# Patient Record
Sex: Female | Born: 1940 | Race: Black or African American | Hispanic: No | Marital: Married | Smoking: Former smoker
Health system: Southern US, Community
[De-identification: ages and names within clinical notes are randomized; demographics above are authoritative.]

## PROBLEM LIST (undated history)

## (undated) DIAGNOSIS — I779 Disorder of arteries and arterioles, unspecified: Secondary | ICD-10-CM

## (undated) DIAGNOSIS — G459 Transient cerebral ischemic attack, unspecified: Secondary | ICD-10-CM

## (undated) DIAGNOSIS — E785 Hyperlipidemia, unspecified: Secondary | ICD-10-CM

## (undated) DIAGNOSIS — I255 Ischemic cardiomyopathy: Secondary | ICD-10-CM

## (undated) DIAGNOSIS — Z9289 Personal history of other medical treatment: Secondary | ICD-10-CM

## (undated) DIAGNOSIS — E039 Hypothyroidism, unspecified: Secondary | ICD-10-CM

## (undated) DIAGNOSIS — D72829 Elevated white blood cell count, unspecified: Secondary | ICD-10-CM

## (undated) DIAGNOSIS — I4811 Longstanding persistent atrial fibrillation: Secondary | ICD-10-CM

## (undated) DIAGNOSIS — I219 Acute myocardial infarction, unspecified: Secondary | ICD-10-CM

## (undated) DIAGNOSIS — E876 Hypokalemia: Secondary | ICD-10-CM

## (undated) DIAGNOSIS — I2510 Atherosclerotic heart disease of native coronary artery without angina pectoris: Secondary | ICD-10-CM

## (undated) DIAGNOSIS — I82409 Acute embolism and thrombosis of unspecified deep veins of unspecified lower extremity: Secondary | ICD-10-CM

## (undated) DIAGNOSIS — I071 Rheumatic tricuspid insufficiency: Secondary | ICD-10-CM

## (undated) DIAGNOSIS — I739 Peripheral vascular disease, unspecified: Secondary | ICD-10-CM

## (undated) DIAGNOSIS — I639 Cerebral infarction, unspecified: Secondary | ICD-10-CM

## (undated) DIAGNOSIS — N183 Chronic kidney disease, stage 3 unspecified: Secondary | ICD-10-CM

## (undated) DIAGNOSIS — I10 Essential (primary) hypertension: Secondary | ICD-10-CM

## (undated) HISTORY — PX: TOTAL ABDOMINAL HYSTERECTOMY: SHX209

## (undated) HISTORY — DX: Ischemic cardiomyopathy: I25.5

## (undated) HISTORY — DX: Rheumatic tricuspid insufficiency: I07.1

## (undated) HISTORY — PX: APPENDECTOMY: SHX54

## (undated) HISTORY — DX: Hypothyroidism, unspecified: E03.9

## (undated) HISTORY — DX: Peripheral vascular disease, unspecified: I73.9

## (undated) HISTORY — DX: Personal history of other medical treatment: Z92.89

## (undated) HISTORY — DX: Cerebral infarction, unspecified: I63.9

## (undated) HISTORY — PX: COLONOSCOPY: SHX174

## (undated) HISTORY — DX: Elevated white blood cell count, unspecified: D72.829

## (undated) HISTORY — DX: Chronic kidney disease, stage 3 unspecified: N18.30

## (undated) HISTORY — DX: Hyperlipidemia, unspecified: E78.5

## (undated) HISTORY — DX: Essential (primary) hypertension: I10

## (undated) HISTORY — PX: TONSILLECTOMY: SHX5217

## (undated) HISTORY — DX: Longstanding persistent atrial fibrillation: I48.11

## (undated) HISTORY — DX: Acute myocardial infarction, unspecified: I21.9

## (undated) HISTORY — DX: Hypokalemia: E87.6

## (undated) HISTORY — DX: Disorder of arteries and arterioles, unspecified: I77.9

## (undated) HISTORY — DX: Transient cerebral ischemic attack, unspecified: G45.9

## (undated) HISTORY — DX: Acute embolism and thrombosis of unspecified deep veins of unspecified lower extremity: I82.409

## (undated) HISTORY — PX: OTHER SURGICAL HISTORY: SHX169

## (undated) HISTORY — DX: Atherosclerotic heart disease of native coronary artery without angina pectoris: I25.10

---

## 1990-03-11 DIAGNOSIS — I219 Acute myocardial infarction, unspecified: Secondary | ICD-10-CM

## 1990-03-11 HISTORY — DX: Acute myocardial infarction, unspecified: I21.9

## 1990-03-11 HISTORY — PX: CARDIAC CATHETERIZATION: SHX172

## 1999-06-06 ENCOUNTER — Encounter: Admission: RE | Admit: 1999-06-06 | Discharge: 1999-06-06 | Payer: Self-pay | Admitting: Obstetrics & Gynecology

## 1999-06-06 ENCOUNTER — Encounter: Payer: Self-pay | Admitting: Obstetrics & Gynecology

## 2004-03-06 ENCOUNTER — Ambulatory Visit: Payer: Self-pay | Admitting: Internal Medicine

## 2004-03-13 ENCOUNTER — Ambulatory Visit: Payer: Self-pay | Admitting: Internal Medicine

## 2004-09-05 ENCOUNTER — Ambulatory Visit: Payer: Self-pay | Admitting: Cardiology

## 2004-09-21 ENCOUNTER — Ambulatory Visit: Payer: Self-pay | Admitting: Internal Medicine

## 2004-09-25 ENCOUNTER — Ambulatory Visit: Payer: Self-pay | Admitting: Internal Medicine

## 2004-11-30 ENCOUNTER — Ambulatory Visit: Payer: Self-pay | Admitting: Cardiology

## 2005-08-22 ENCOUNTER — Encounter: Admission: RE | Admit: 2005-08-22 | Discharge: 2005-08-22 | Payer: Self-pay | Admitting: Obstetrics & Gynecology

## 2005-09-25 ENCOUNTER — Ambulatory Visit: Payer: Self-pay | Admitting: Internal Medicine

## 2005-09-30 ENCOUNTER — Ambulatory Visit: Payer: Self-pay | Admitting: Cardiology

## 2005-10-04 ENCOUNTER — Ambulatory Visit: Payer: Self-pay

## 2005-10-04 ENCOUNTER — Ambulatory Visit: Payer: Self-pay | Admitting: Cardiology

## 2005-10-04 ENCOUNTER — Encounter: Payer: Self-pay | Admitting: Internal Medicine

## 2005-10-10 ENCOUNTER — Ambulatory Visit: Payer: Self-pay | Admitting: Cardiology

## 2006-07-22 ENCOUNTER — Encounter: Payer: Self-pay | Admitting: Internal Medicine

## 2006-07-22 ENCOUNTER — Ambulatory Visit: Payer: Self-pay | Admitting: Internal Medicine

## 2006-09-17 ENCOUNTER — Encounter: Admission: RE | Admit: 2006-09-17 | Discharge: 2006-09-17 | Payer: Self-pay | Admitting: Internal Medicine

## 2006-10-06 ENCOUNTER — Ambulatory Visit: Payer: Self-pay | Admitting: Cardiology

## 2006-10-29 ENCOUNTER — Ambulatory Visit: Payer: Self-pay | Admitting: Internal Medicine

## 2006-10-29 DIAGNOSIS — I2510 Atherosclerotic heart disease of native coronary artery without angina pectoris: Secondary | ICD-10-CM | POA: Insufficient documentation

## 2006-11-05 LAB — CONVERTED CEMR LAB
ALT: 23 units/L (ref 0–35)
AST: 26 units/L (ref 0–37)
Albumin: 4.2 g/dL (ref 3.5–5.2)
Alkaline Phosphatase: 79 units/L (ref 39–117)
BUN: 15 mg/dL (ref 6–23)
Basophils Absolute: 0.1 10*3/uL (ref 0.0–0.1)
Basophils Relative: 0.6 % (ref 0.0–1.0)
Bilirubin, Direct: 0.1 mg/dL (ref 0.0–0.3)
CO2: 33 meq/L — ABNORMAL HIGH (ref 19–32)
Calcium: 9.6 mg/dL (ref 8.4–10.5)
Chloride: 103 meq/L (ref 96–112)
Cholesterol: 150 mg/dL (ref 0–200)
Creatinine, Ser: 1 mg/dL (ref 0.4–1.2)
Eosinophils Absolute: 0.1 10*3/uL (ref 0.0–0.6)
Eosinophils Relative: 1 % (ref 0.0–5.0)
GFR calc Af Amer: 71 mL/min
GFR calc non Af Amer: 59 mL/min
Glucose, Bld: 105 mg/dL — ABNORMAL HIGH (ref 70–99)
HCT: 39.2 % (ref 36.0–46.0)
HDL: 54.6 mg/dL (ref >39.0)
Hemoglobin: 13.6 g/dL (ref 12.0–15.0)
LDL Cholesterol: 73 mg/dL (ref 0–99)
Lymphocytes Relative: 32.8 % (ref 12.0–46.0)
MCHC: 34.8 g/dL (ref 30.0–36.0)
MCV: 92.4 fL (ref 78.0–100.0)
Monocytes Absolute: 0.9 10*3/uL — ABNORMAL HIGH (ref 0.2–0.7)
Monocytes Relative: 8.3 % (ref 3.0–11.0)
Neutro Abs: 5.8 10*3/uL (ref 1.4–7.7)
Neutrophils Relative %: 57.3 % (ref 43.0–77.0)
Platelets: 300 10*3/uL (ref 150–400)
Potassium: 3.3 meq/L — ABNORMAL LOW (ref 3.5–5.1)
RBC: 4.24 M/uL (ref 3.87–5.11)
RDW: 12.3 % (ref 11.5–14.6)
Sodium: 144 meq/L (ref 135–145)
TSH: 6.34 microintl units/mL — ABNORMAL HIGH (ref 0.35–5.50)
Total Bilirubin: 0.9 mg/dL (ref 0.3–1.2)
Total CHOL/HDL Ratio: 2.7
Total Protein: 7.5 g/dL (ref 6.0–8.3)
Triglycerides: 110 mg/dL (ref 0–149)
VLDL: 22 mg/dL (ref 0–40)
WBC: 10.3 10*3/uL (ref 4.5–10.5)

## 2006-11-06 ENCOUNTER — Encounter (INDEPENDENT_AMBULATORY_CARE_PROVIDER_SITE_OTHER): Payer: Self-pay | Admitting: *Deleted

## 2006-11-07 ENCOUNTER — Ambulatory Visit: Payer: Self-pay | Admitting: Internal Medicine

## 2006-11-11 ENCOUNTER — Encounter (INDEPENDENT_AMBULATORY_CARE_PROVIDER_SITE_OTHER): Payer: Self-pay | Admitting: *Deleted

## 2006-11-14 ENCOUNTER — Telehealth (INDEPENDENT_AMBULATORY_CARE_PROVIDER_SITE_OTHER): Payer: Self-pay | Admitting: *Deleted

## 2006-11-14 ENCOUNTER — Ambulatory Visit: Payer: Self-pay | Admitting: Internal Medicine

## 2006-11-27 ENCOUNTER — Ambulatory Visit: Payer: Self-pay | Admitting: Internal Medicine

## 2006-11-29 LAB — CONVERTED CEMR LAB
Free T4: 0.6 ng/dL (ref 0.6–1.6)
Hgb A1c MFr Bld: 5.2 % (ref 4.6–6.0)
Potassium: 3.8 meq/L (ref 3.5–5.1)
TSH: 8.24 microintl units/mL — ABNORMAL HIGH (ref 0.35–5.50)

## 2006-12-01 ENCOUNTER — Encounter (INDEPENDENT_AMBULATORY_CARE_PROVIDER_SITE_OTHER): Payer: Self-pay | Admitting: *Deleted

## 2006-12-04 ENCOUNTER — Ambulatory Visit: Payer: Self-pay | Admitting: Internal Medicine

## 2006-12-23 ENCOUNTER — Encounter: Payer: Self-pay | Admitting: Internal Medicine

## 2006-12-23 ENCOUNTER — Ambulatory Visit: Payer: Self-pay | Admitting: Internal Medicine

## 2006-12-23 LAB — HM COLONOSCOPY

## 2007-01-21 DIAGNOSIS — I82409 Acute embolism and thrombosis of unspecified deep veins of unspecified lower extremity: Secondary | ICD-10-CM | POA: Insufficient documentation

## 2007-01-23 ENCOUNTER — Ambulatory Visit: Payer: Self-pay | Admitting: Internal Medicine

## 2007-02-19 ENCOUNTER — Ambulatory Visit: Payer: Self-pay | Admitting: Internal Medicine

## 2007-02-22 LAB — CONVERTED CEMR LAB: TSH: 3.13 microintl units/mL (ref 0.35–5.50)

## 2007-02-23 ENCOUNTER — Encounter (INDEPENDENT_AMBULATORY_CARE_PROVIDER_SITE_OTHER): Payer: Self-pay | Admitting: *Deleted

## 2007-02-24 ENCOUNTER — Ambulatory Visit: Payer: Self-pay | Admitting: Internal Medicine

## 2007-02-24 DIAGNOSIS — I10 Essential (primary) hypertension: Secondary | ICD-10-CM | POA: Insufficient documentation

## 2007-02-24 DIAGNOSIS — E785 Hyperlipidemia, unspecified: Secondary | ICD-10-CM | POA: Insufficient documentation

## 2007-02-24 DIAGNOSIS — E039 Hypothyroidism, unspecified: Secondary | ICD-10-CM | POA: Insufficient documentation

## 2007-06-04 DIAGNOSIS — K5730 Diverticulosis of large intestine without perforation or abscess without bleeding: Secondary | ICD-10-CM | POA: Insufficient documentation

## 2007-06-04 DIAGNOSIS — I739 Peripheral vascular disease, unspecified: Secondary | ICD-10-CM | POA: Insufficient documentation

## 2007-08-24 ENCOUNTER — Ambulatory Visit: Payer: Self-pay | Admitting: Internal Medicine

## 2007-09-04 ENCOUNTER — Encounter (INDEPENDENT_AMBULATORY_CARE_PROVIDER_SITE_OTHER): Payer: Self-pay | Admitting: *Deleted

## 2007-09-04 LAB — CONVERTED CEMR LAB: TSH: 6.42 microintl units/mL — ABNORMAL HIGH (ref 0.35–5.50)

## 2007-09-29 ENCOUNTER — Ambulatory Visit: Payer: Self-pay | Admitting: Cardiology

## 2007-10-22 ENCOUNTER — Encounter: Admission: RE | Admit: 2007-10-22 | Discharge: 2007-10-22 | Payer: Self-pay | Admitting: Internal Medicine

## 2007-10-26 ENCOUNTER — Ambulatory Visit: Payer: Self-pay | Admitting: Internal Medicine

## 2007-10-26 LAB — CONVERTED CEMR LAB
Bilirubin Urine: NEGATIVE
Blood in Urine, dipstick: NEGATIVE
Cholesterol, target level: 200 mg/dL
Glucose, Urine, Semiquant: NEGATIVE
HDL goal, serum: 40 mg/dL
Ketones, urine, test strip: NEGATIVE
LDL Goal: 100 mg/dL
Nitrite: NEGATIVE
Protein, U semiquant: NEGATIVE
Specific Gravity, Urine: <1.005
Urobilinogen, UA: NEGATIVE
WBC Urine, dipstick: NEGATIVE
pH: 5

## 2007-10-30 ENCOUNTER — Encounter: Admission: RE | Admit: 2007-10-30 | Discharge: 2007-10-30 | Payer: Self-pay | Admitting: Internal Medicine

## 2007-11-01 LAB — CONVERTED CEMR LAB
ALT: 15 units/L (ref 0–35)
AST: 23 units/L (ref 0–37)
Albumin: 4.2 g/dL (ref 3.5–5.2)
Alkaline Phosphatase: 95 units/L (ref 39–117)
BUN: 12 mg/dL (ref 6–23)
Basophils Absolute: 0 10*3/uL (ref 0.0–0.1)
Basophils Relative: 0 % (ref 0.0–3.0)
Bilirubin, Direct: 0.1 mg/dL (ref 0.0–0.3)
CO2: 30 meq/L (ref 19–32)
Calcium: 9.2 mg/dL (ref 8.4–10.5)
Chloride: 105 meq/L (ref 96–112)
Cholesterol: 136 mg/dL (ref 0–200)
Creatinine, Ser: 1 mg/dL (ref 0.4–1.2)
Eosinophils Absolute: 0.2 10*3/uL (ref 0.0–0.7)
Eosinophils Relative: 1.7 % (ref 0.0–5.0)
GFR calc Af Amer: 71 mL/min
GFR calc non Af Amer: 59 mL/min
Glucose, Bld: 85 mg/dL (ref 70–99)
HCT: 40.4 % (ref 36.0–46.0)
HDL: 56.3 mg/dL (ref >39.0)
Hemoglobin: 13.8 g/dL (ref 12.0–15.0)
LDL Cholesterol: 67 mg/dL (ref 0–99)
Lymphocytes Relative: 31.6 % (ref 12.0–46.0)
MCHC: 34.2 g/dL (ref 30.0–36.0)
MCV: 94.1 fL (ref 78.0–100.0)
Monocytes Absolute: 0.3 10*3/uL (ref 0.1–1.0)
Monocytes Relative: 3.3 % (ref 3.0–12.0)
Neutro Abs: 5.8 10*3/uL (ref 1.4–7.7)
Neutrophils Relative %: 63.4 % (ref 43.0–77.0)
Platelets: 278 10*3/uL (ref 150–400)
Potassium: 4 meq/L (ref 3.5–5.1)
RBC: 4.3 M/uL (ref 3.87–5.11)
RDW: 12.3 % (ref 11.5–14.6)
Sodium: 142 meq/L (ref 135–145)
TSH: 5.45 microintl units/mL (ref 0.35–5.50)
Total Bilirubin: 0.6 mg/dL (ref 0.3–1.2)
Total CHOL/HDL Ratio: 2.4
Total Protein: 7.6 g/dL (ref 6.0–8.3)
Triglycerides: 63 mg/dL (ref 0–149)
VLDL: 13 mg/dL (ref 0–40)
WBC: 9.2 10*3/uL (ref 4.5–10.5)

## 2008-08-09 ENCOUNTER — Telehealth (INDEPENDENT_AMBULATORY_CARE_PROVIDER_SITE_OTHER): Payer: Self-pay | Admitting: *Deleted

## 2008-08-10 ENCOUNTER — Ambulatory Visit: Payer: Self-pay | Admitting: Internal Medicine

## 2008-08-10 ENCOUNTER — Telehealth (INDEPENDENT_AMBULATORY_CARE_PROVIDER_SITE_OTHER): Payer: Self-pay | Admitting: *Deleted

## 2008-08-10 DIAGNOSIS — N959 Unspecified menopausal and perimenopausal disorder: Secondary | ICD-10-CM | POA: Insufficient documentation

## 2008-08-10 DIAGNOSIS — R0989 Other specified symptoms and signs involving the circulatory and respiratory systems: Secondary | ICD-10-CM | POA: Insufficient documentation

## 2008-08-17 ENCOUNTER — Encounter: Payer: Self-pay | Admitting: Internal Medicine

## 2008-08-17 ENCOUNTER — Ambulatory Visit: Payer: Self-pay

## 2008-08-23 ENCOUNTER — Ambulatory Visit: Payer: Self-pay | Admitting: Internal Medicine

## 2008-08-23 ENCOUNTER — Encounter: Payer: Self-pay | Admitting: Internal Medicine

## 2008-08-26 ENCOUNTER — Encounter (INDEPENDENT_AMBULATORY_CARE_PROVIDER_SITE_OTHER): Payer: Self-pay | Admitting: *Deleted

## 2008-09-13 ENCOUNTER — Ambulatory Visit: Payer: Self-pay | Admitting: Cardiology

## 2008-09-15 ENCOUNTER — Encounter (INDEPENDENT_AMBULATORY_CARE_PROVIDER_SITE_OTHER): Payer: Self-pay | Admitting: *Deleted

## 2008-10-26 ENCOUNTER — Encounter: Payer: Self-pay | Admitting: Internal Medicine

## 2008-11-16 ENCOUNTER — Encounter: Payer: Self-pay | Admitting: Internal Medicine

## 2008-11-23 ENCOUNTER — Ambulatory Visit: Payer: Self-pay | Admitting: Internal Medicine

## 2008-11-27 LAB — CONVERTED CEMR LAB
ALT: 16 units/L (ref 0–35)
AST: 22 units/L (ref 0–37)
Albumin: 4 g/dL (ref 3.5–5.2)
Alkaline Phosphatase: 130 units/L — ABNORMAL HIGH (ref 39–117)
BUN: 15 mg/dL (ref 6–23)
Basophils Absolute: 0.1 10*3/uL (ref 0.0–0.1)
Basophils Relative: 0.6 % (ref 0.0–3.0)
Bilirubin, Direct: 0.1 mg/dL (ref 0.0–0.3)
CO2: 29 meq/L (ref 19–32)
Calcium: 9.1 mg/dL (ref 8.4–10.5)
Chloride: 109 meq/L (ref 96–112)
Cholesterol: 168 mg/dL (ref 0–200)
Creatinine, Ser: 0.9 mg/dL (ref 0.4–1.2)
Eosinophils Absolute: 0.1 10*3/uL (ref 0.0–0.7)
Eosinophils Relative: 1.3 % (ref 0.0–5.0)
GFR calc non Af Amer: 79.99 mL/min (ref >60)
Glucose, Bld: 96 mg/dL (ref 70–99)
HCT: 41 % (ref 36.0–46.0)
HDL: 53.9 mg/dL (ref >39.00)
Hemoglobin: 13.7 g/dL (ref 12.0–15.0)
LDL Cholesterol: 101 mg/dL — ABNORMAL HIGH (ref 0–99)
Lymphocytes Relative: 29.1 % (ref 12.0–46.0)
Lymphs Abs: 2.5 10*3/uL (ref 0.7–4.0)
MCHC: 33.5 g/dL (ref 30.0–36.0)
MCV: 95.3 fL (ref 78.0–100.0)
Monocytes Absolute: 0.7 10*3/uL (ref 0.1–1.0)
Monocytes Relative: 7.5 % (ref 3.0–12.0)
Neutro Abs: 5.3 10*3/uL (ref 1.4–7.7)
Neutrophils Relative %: 61.5 % (ref 43.0–77.0)
Platelets: 234 10*3/uL (ref 150.0–400.0)
Potassium: 4.3 meq/L (ref 3.5–5.1)
RBC: 4.3 M/uL (ref 3.87–5.11)
RDW: 13 % (ref 11.5–14.6)
Sodium: 144 meq/L (ref 135–145)
TSH: 5.06 microintl units/mL (ref 0.35–5.50)
Total Bilirubin: 0.9 mg/dL (ref 0.3–1.2)
Total CHOL/HDL Ratio: 3
Total Protein: 7.2 g/dL (ref 6.0–8.3)
Triglycerides: 67 mg/dL (ref 0.0–149.0)
VLDL: 13.4 mg/dL (ref 0.0–40.0)
WBC: 8.7 10*3/uL (ref 4.5–10.5)

## 2008-11-28 ENCOUNTER — Encounter (INDEPENDENT_AMBULATORY_CARE_PROVIDER_SITE_OTHER): Payer: Self-pay | Admitting: *Deleted

## 2008-12-19 ENCOUNTER — Encounter: Payer: Self-pay | Admitting: Internal Medicine

## 2009-08-24 ENCOUNTER — Encounter: Payer: Self-pay | Admitting: Internal Medicine

## 2009-08-25 ENCOUNTER — Encounter: Payer: Self-pay | Admitting: Internal Medicine

## 2009-08-25 ENCOUNTER — Ambulatory Visit: Payer: Self-pay

## 2009-08-31 ENCOUNTER — Ambulatory Visit: Payer: Self-pay | Admitting: Internal Medicine

## 2009-09-13 ENCOUNTER — Encounter: Payer: Self-pay | Admitting: Internal Medicine

## 2009-09-18 ENCOUNTER — Ambulatory Visit: Payer: Self-pay | Admitting: Cardiology

## 2009-09-20 LAB — CONVERTED CEMR LAB
ALT: 14 units/L (ref 0–35)
AST: 19 units/L (ref 0–37)
Albumin: 4.2 g/dL (ref 3.5–5.2)
Alkaline Phosphatase: 111 units/L (ref 39–117)
BUN: 14 mg/dL (ref 6–23)
Basophils Absolute: 0.1 10*3/uL (ref 0.0–0.1)
Basophils Relative: 0.8 % (ref 0.0–3.0)
Bilirubin, Direct: 0.1 mg/dL (ref 0.0–0.3)
CO2: 29 meq/L (ref 19–32)
Calcium: 9.2 mg/dL (ref 8.4–10.5)
Chloride: 108 meq/L (ref 96–112)
Cholesterol: 146 mg/dL (ref 0–200)
Creatinine, Ser: 0.9 mg/dL (ref 0.4–1.2)
Eosinophils Absolute: 0.1 10*3/uL (ref 0.0–0.7)
Eosinophils Relative: 1.2 % (ref 0.0–5.0)
GFR calc non Af Amer: 82.98 mL/min (ref >60)
Glucose, Bld: 101 mg/dL — ABNORMAL HIGH (ref 70–99)
HCT: 38.4 % (ref 36.0–46.0)
HDL: 54.8 mg/dL (ref >39.00)
Hemoglobin: 13.1 g/dL (ref 12.0–15.0)
LDL Cholesterol: 76 mg/dL (ref 0–99)
Lymphocytes Relative: 28.2 % (ref 12.0–46.0)
Lymphs Abs: 2.6 10*3/uL (ref 0.7–4.0)
MCHC: 34.2 g/dL (ref 30.0–36.0)
MCV: 93.9 fL (ref 78.0–100.0)
Monocytes Absolute: 0.6 10*3/uL (ref 0.1–1.0)
Monocytes Relative: 6.5 % (ref 3.0–12.0)
Neutro Abs: 5.8 10*3/uL (ref 1.4–7.7)
Neutrophils Relative %: 63.3 % (ref 43.0–77.0)
Platelets: 229 10*3/uL (ref 150.0–400.0)
Potassium: 3.8 meq/L (ref 3.5–5.1)
RBC: 4.09 M/uL (ref 3.87–5.11)
RDW: 13.4 % (ref 11.5–14.6)
Sodium: 142 meq/L (ref 135–145)
TSH: 5.34 microintl units/mL (ref 0.35–5.50)
Total Bilirubin: 0.7 mg/dL (ref 0.3–1.2)
Total CHOL/HDL Ratio: 3
Total Protein: 7.1 g/dL (ref 6.0–8.3)
Triglycerides: 75 mg/dL (ref 0.0–149.0)
VLDL: 15 mg/dL (ref 0.0–40.0)
WBC: 9.2 10*3/uL (ref 4.5–10.5)

## 2009-10-04 ENCOUNTER — Ambulatory Visit: Payer: Self-pay | Admitting: Cardiology

## 2009-10-04 ENCOUNTER — Ambulatory Visit (HOSPITAL_COMMUNITY): Admission: RE | Admit: 2009-10-04 | Discharge: 2009-10-04 | Payer: Self-pay | Admitting: Cardiology

## 2009-10-04 ENCOUNTER — Encounter (INDEPENDENT_AMBULATORY_CARE_PROVIDER_SITE_OTHER): Payer: Self-pay | Admitting: *Deleted

## 2009-10-04 ENCOUNTER — Encounter: Payer: Self-pay | Admitting: Cardiology

## 2009-10-04 ENCOUNTER — Ambulatory Visit: Payer: Self-pay

## 2009-12-13 ENCOUNTER — Encounter: Admission: RE | Admit: 2009-12-13 | Discharge: 2009-12-13 | Payer: Self-pay | Admitting: Internal Medicine

## 2010-04-02 ENCOUNTER — Encounter: Payer: Self-pay | Admitting: Internal Medicine

## 2010-04-10 NOTE — Letter (Signed)
Summary: Outpatient Coinsurance Notice  Outpatient Coinsurance Notice   Imported By: Marylou Mccoy 10/18/2009 14:57:05  _____________________________________________________________________  External Attachment:    Type:   Image     Comment:   External Document

## 2010-04-10 NOTE — Miscellaneous (Signed)
Summary: Orders Update  Clinical Lists Changes  Orders: Added new Test order of Carotid Duplex (Carotid Duplex) - Signed 

## 2010-04-10 NOTE — Assessment & Plan Note (Signed)
Summary: 1 YR F/U      Allergies Added:    Visit Type:  Follow-up Primary Provider:  Marga Melnick MD  CC:  no complaints.  History of Present Illness: Patient is 70 years old and returns for management of CAD. In 1992 she had an anterior MI treated with PCI. Her ejection fraction 2007 was 45%. She has been doing quite well over the past year with no chest pain shortness breath or palpitations. She hasn't been exercising as much and she has gained some weight. Her other problems include peripheral vascular disease with some claudication. She also has carotid disease and had recent Doppler studies which showed 0-39% stenosis bilaterally.  Her other problems include hypertension and hyperlipidemia.  Current Medications (verified): 1)  Crestor 20 Mg Tabs (Rosuvastatin Calcium) .... Daily As Directed 2)  Plavix 75 Mg Tabs (Clopidogrel Bisulfate) .Marland Kitchen.. 1 By Mouth Qd 3)  Amlodipine Besy-Benazepril Hcl 5-20 Mg Caps (Amlodipine Besy-Benazepril Hcl) .... 2 Tabs Once Daily 4)  Asa 81mg  1 Po Qd 5)  Vit E 6)  Vit C 7)  Multivitamin 8)  Fish Oil 9)  Hydrochlorothiazide 25 Mg  Tabs (Hydrochlorothiazide) .Marland Kitchen.. 1 Tab By Mouth Once Daily With Oj 10)  Levothroid 25 Mcg  Tabs (Levothyroxine Sodium) .Marland Kitchen.. 1 Once Daily- Labs Due Now 11)  Metoprolol Succinate 25 Mg Xr24h-Tab (Metoprolol Succinate) .... Take One Half  Tablet By Mouth Daily  Allergies (verified): 1)  ! * Pletal  Past History:  Past Medical History: Reviewed history from 08/31/2009 and no changes required. MI 54 @ age  16 ; TIA  treated with  coumadin until Plavix initiated 2006; Hyperlipidemia Hypertension Hypothyroidism DVT X1 CAD; R carotid bruit with 0-30 % blockage  1. Coronary artery disease, status post anterior wall myocardial     infarction in 1992, treated with percutaneous transluminal coronary     angioplasty of the left anterior descending. 2. Ejection fraction 45%. 3. Peripheral vascular disease.   Review of  Systems       .ROS is negative except as outlined in HPI.   Vital Signs:  Patient profile:   70 year old female Height:      66 inches Weight:      206 pounds BMI:     33.37 Pulse rate:   54 / minute Resp:     18 per minute BP sitting:   164 / 62  (right arm)  Vitals Entered By: Marrion Coy, CNA (September 18, 2009 8:48 AM)  Physical Exam  Additional Exam:  Gen. Well-nourished, in no distress   Neck: No JVD, thyroid not enlarged, no carotid bruits Lungs: No tachypnea, clear without rales, rhonchi or wheezes Cardiovascular: Rhythm regular, PMI not displaced,  heart sounds  normal, no murmurs or gallops, no peripheral edema, pulses normal in all 4 extremities. Abdomen: BS normal, abdomen soft and non-tender without masses or organomegaly, no hepatosplenomegaly. MS: No deformities, no cyanosis or clubbing   Neuro:  No focal sns   Skin:  no lesions    Impression & Recommendations:  Problem # 1:  C A D (ICD-414.00) She had an anterior MI in 1992 treated with PCI. she has had no recent chest pain this problem appears stable.  Her ejection fraction in 2007 was 45%. She has had an echocardiogram since then we will repeat that.  We will arrange followup with Dr. Gildardo Griffes in one year.  Her updated medication list for this problem includes:    Plavix 75 Mg Tabs (Clopidogrel bisulfate) .Marland KitchenMarland KitchenMarland KitchenMarland Kitchen  1 by mouth qd    Amlodipine Besy-benazepril Hcl 5-20 Mg Caps (Amlodipine besy-benazepril hcl) .Marland Kitchen... 2 tabs once daily    Metoprolol Succinate 25 Mg Xr24h-tab (Metoprolol succinate) .Marland Kitchen... Take one half  tablet by mouth daily  Orders: Echocardiogram (Echo) TLB-BMP (Basic Metabolic Panel-BMET) (80048-METABOL) TLB-CBC Platelet - w/Differential (85025-CBCD) TLB-Lipid Panel (80061-LIPID) TLB-Hepatic/Liver Function Pnl (80076-HEPATIC) TLB-TSH (Thyroid Stimulating Hormone) (84443-TSH)  Her updated medication list for this problem includes:    Plavix 75 Mg Tabs (Clopidogrel bisulfate) .Marland Kitchen... 1 by mouth qd     Amlodipine Besy-benazepril Hcl 5-20 Mg Caps (Amlodipine besy-benazepril hcl) .Marland Kitchen... 2 tabs once daily    Metoprolol Succinate 25 Mg Xr24h-tab (Metoprolol succinate) .Marland Kitchen... Take one half  tablet by mouth daily  Problem # 2:  HYPERTENSION, ESSENTIAL NOS (ICD-401.9)  Her blood pressure is borderline today. She did not take her hydrochlorothiazide and her readings have been normal in home. We will continue current therapy. Her updated medication list for this problem includes:    Amlodipine Besy-benazepril Hcl 5-20 Mg Caps (Amlodipine besy-benazepril hcl) .Marland Kitchen... 2 tabs once daily    Hydrochlorothiazide 25 Mg Tabs (Hydrochlorothiazide) .Marland Kitchen... 1 tab by mouth once daily with oj    Metoprolol Succinate 25 Mg Xr24h-tab (Metoprolol succinate) .Marland Kitchen... Take one half  tablet by mouth daily  Orders: TLB-BMP (Basic Metabolic Panel-BMET) (80048-METABOL) TLB-CBC Platelet - w/Differential (85025-CBCD) TLB-Lipid Panel (80061-LIPID) TLB-Hepatic/Liver Function Pnl (80076-HEPATIC) TLB-TSH (Thyroid Stimulating Hormone) (84443-TSH)  Her updated medication list for this problem includes:    Amlodipine Besy-benazepril Hcl 5-20 Mg Caps (Amlodipine besy-benazepril hcl) .Marland Kitchen... 2 tabs once daily    Hydrochlorothiazide 25 Mg Tabs (Hydrochlorothiazide) .Marland Kitchen... 1 tab by mouth once daily with oj    Metoprolol Succinate 25 Mg Xr24h-tab (Metoprolol succinate) .Marland Kitchen... Take one half  tablet by mouth daily  Problem # 3:  CAROTID BRUIT, RIGHT (ICD-785.9) She had recent carotid studies which showed 0-39% stenosis bilaterally. Followup is recommended in 2 years.  Problem # 4:  PERIPHERAL VASCULAR DISEASE (ICD-443.9) She has peripheral vascular disease but is currently not very symptomatic from this and this appears stable.  Problem # 5:  HYPERLIPIDEMIA (ICD-272.4) we will get a lipid profile today. Her updated medication list for this problem includes:    Crestor 20 Mg Tabs (Rosuvastatin calcium) .Marland Kitchen... Daily as directed  Patient  Instructions: 1)  Your physician recommends that you schedule a follow-up appointment in: i 1 year with Dr. Clifton James 2)  Your physician recommends that you have lab work today: CBC,BMET, TSH, LIPID, LIVER 3)  Your physician recommends that you continue on your current medications as directed. Please refer to the Current Medication list given to you today. 4)  Your physician has requested that you have an echocardiogram.  Echocardiography is a painless test that uses sound waves to create images of your heart. It provides your doctor with information about the size and shape of your heart and how well your heart's chambers and valves are working.  This procedure takes approximately one hour. There are no restrictions for this procedure.

## 2010-04-10 NOTE — Assessment & Plan Note (Signed)
Summary: cpx//kn   Vital Signs:  Patient profile:   70 year old female Height:      66 inches Weight:      204.0 pounds BMI:     33.05 Temp:     98.1 degrees F oral Resp:     14 per minute BP sitting:   118 / 78  (left arm) Cuff size:   large  Vitals Entered By: Yvonne Bailey (August 31, 2009 4:03 PM)  CC: Yearly follow-up, General Medical Evaluation Comments REVIEWED MED LIST, PATIENT AGREED DOSE AND INSTRUCTION CORRECT    Primary Care Provider:  Marga Melnick MD  CC:  Yearly follow-up and General Medical Evaluation.  History of Present Illness: Yvonne Bailey is here for a physical ; she is essentially asymptomatic.Here for Medicare AWV: 1.Risk factors based on Past M, S, F history:CAD, PVD with claudication, Hypothyroidism(out of meds X 4 weeks due to work schedule) 2.Physical Activities: inactive due to work schedule 3.Depression/mood: denied 4.Hearing: whisper heard @ 6 ft 5.ADL's: no limitations 6.Fall Risk: denied (L ankle stable despite PMH of fracture post fall on ice) 7Home Safety: no issues 8.Height, weight, &visual acuity:read wall chart with glasses 9.Counseling:  10.Labs ordered based on risk factors: see Orders 11. Referral Coordination: Cardiologist seen annually;F/U scheduled 09/2009                                          12.  Care Plan: see Patient Instructions 13.  Cognitive Assessment: read charts;oriented X 3; recall , memory intact.She is Substitute Teacher Hyperlipidemia Follow-Up      This is a 75 year old woman who presents for Hyperlipidemia follow-up.  The patient denies muscle aches, GI upset, flushing, and itching.  The patient denies the following symptoms: syncope.  Compliance with medications (by patient report) has been near 100%.  Adjunctive measures currently used by the patient include ASA and fish oil supplements.   Hypertension Follow-Up      The patient also presents for Hypertension follow-up.  The patient reports urinary frequency mainly   @ night, but denies headaches.  Compliance with medications (by patient report) has been near 100%.  Adjunctive measures currently used by the patient include salt restriction.  BP @ home controlled.  Preventive Screening-Counseling & Management  Alcohol-Tobacco     Alcohol drinks/day: 0     Smoking Status: quit > 6 months     Year Quit: 1992 post MI  Caffeine-Diet-Exercise     Caffeine use/day: only decaf     Diet Comments: no diet     Does Patient Exercise: no  Hep-HIV-STD-Contraception     Dental Visit-last 6 months yes     Sun Exposure-Excessive: no  Safety-Violence-Falls     Seat Belt Use: yes     Firearms in the Home: no firearms in the home     Smoke Detectors: yes     Violence in the Home: no risk noted     Sexual Abuse: no     Fall Risk: none      Sexual History:  currently monogamous.        Drug Use:  never.        Blood Transfusions:  no.        Travel History:  no travel.    Allergies: 1)  ! * Pletal  Past History:  Past Medical History: MI 38 @ age  50 ; TIA  treated with  coumadin until Plavix initiated 2006; Hyperlipidemia Hypertension Hypothyroidism DVT X1 CAD; R carotid bruit with 0-30 % blockage  1. Coronary artery disease, status post anterior wall myocardial     infarction in 1992, treated with percutaneous transluminal coronary     angioplasty of the left anterior descending. 2. Ejection fraction 45%. 3. Peripheral vascular disease.   Past Surgical History: Fracture LLE  1994, pinned ; catheterization  post MI 1992, Dr Charlies Constable Appendectomy  @ Hysterectomy &  USO for fibroids, Dr Kizzie Bane Tonsillectomy Colonoscopy 2009 negative , Dr Lina Sar  Family History: Father:died in    MVA Mother:elevated  lipids, PVD,IDDM Siblings: bro: stents/ CAD, PVD,HTN Maternal aunt: CVA  Social History: No diet Occupation:Substitute Teacher Former Smoker: quit 1992 Alcohol use-no Regular exercise-no Does Patient Exercise:  no Smoking  Status:  quit > 6 months Caffeine use/day:  only decaf Dental Care w/in 6 mos.:  yes Sun Exposure-Excessive:  no Seat Belt Use:  yes Fall Risk:  none Sexual History:  currently monogamous Drug Use:  never Blood Transfusions:  no  Review of Systems General:  Denies chills, fatigue, fever, sleep disorder, sweats, and weight loss. Eyes:  Denies blurring, double vision, and vision loss-both eyes. ENT:  Denies difficulty swallowing, hoarseness, and nosebleeds. CV:  Complains of leg cramps with exertion; denies chest pain or discomfort, difficulty breathing at night, difficulty breathing while lying down, lightheadness, near fainting, palpitations, shortness of breath with exertion, and swelling of hands; Claudication only after 2 blocks. Edema LLE  since fracture .  RLE after standing. Resp:  Denies cough, shortness of breath, and sputum productive. GI:  Denies abdominal pain, bloody stools, dark tarry stools, and indigestion. GU:  Denies discharge, dysuria, and hematuria. MS:  Denies joint pain, joint redness, joint swelling, low back pain, mid back pain, and thoracic pain. Derm:  Complains of lesion(s); denies changes in nail beds, dryness, hair loss, and rash; Cyst R thumb X 1 month. Neuro:  Denies brief paralysis, headaches, numbness, tingling, and weakness. Psych:  Denies anxiety, depression, easily angered, easily tearful, and irritability. Endo:  Denies cold intolerance, excessive hunger, excessive thirst, excessive urination, and heat intolerance. Heme:  Denies abnormal bruising and bleeding. Allergy:  Denies itching eyes and sneezing.  Physical Exam  General:  Appears younger than age,well-nourished, alert,appropriate and cooperative throughout examination Head:  Normocephalic and atraumatic without obvious abnormalities.  Eyes:  No corneal or conjunctival inflammation noted.  Perrla. Funduscopic exam benign, without hemorrhages, exudates or papilledema. Ears:  External ear exam  shows no significant lesions or deformities.  Otoscopic examination reveals clear canals, tympanic membranes are intact bilaterally without bulging, retraction, inflammation or discharge. Hearing is grossly normal bilaterally. Nose:  External nasal examination shows no deformity or inflammation. Nasal mucosa are pink and moist without lesions or exudates. Mouth:  Oral mucosa and oropharynx without lesions or exudates.  Upper plate Neck:  No deformities, masses, or tenderness noted. thyroid firm w/o nodules Lungs:  Normal respiratory effort, chest expands symmetrically. Lungs are clear to auscultation, no crackles or wheezes.  BS decreased Heart:  normal rate, regular rhythm, no gallop, no rub, no JVD, no HJR, and grade 1 /6 systolic murmur.   Abdomen:  Bowel sounds positive,abdomen soft and non-tender without masses, organomegaly or hernias noted. Aorta palpable w/o AAA Genitalia:  Dr  Arlyce Dice Msk:  No deformity or scoliosis noted of thoracic or lumbar spine.   Pulses:  R and L carotid,radial  pulses  are full and equal bilaterally. Decreased pedal pulses w/o ischemic changes. Rumble R carotid Extremities:  No clubbing, cyanosis, edema. Fusiform enlargement L ankle. noted with normal full range of motion of all joints.  No significant edema present  Neurologic:  alert & oriented X3 and DTRs symmetrical and normal.   Skin:  Op scars L ankle. Mucoid cyst @ base of   L thumb nail Cervical Nodes:  No lymphadenopathy noted Axillary Nodes:  No palpable lymphadenopathy Psych:  memory intact for recent and remote, normally interactive, and good eye contact.     Impression & Recommendations:  Problem # 1:  PREVENTIVE HEALTH CARE (ICD-V70.0)  Orders: Subsequent annual wellness visit with prevention plan (Z6109)  Problem # 2:  HYPERTENSION, ESSENTIAL NOS (ICD-401.9) controlled Her updated medication list for this problem includes:    Amlodipine Besy-benazepril Hcl 5-20 Mg Caps (Amlodipine  besy-benazepril hcl) .Marland Kitchen... 2 tabs once daily    Hydrochlorothiazide 25 Mg Tabs (Hydrochlorothiazide) .Marland Kitchen... 1 tab by mouth once daily with oj    Metoprolol Succinate 25 Mg Xr24h-tab (Metoprolol succinate) .Marland Kitchen... Take one half  tablet by mouth daily  Orders: EKG w/ Interpretation (93000)  Problem # 3:  HYPERLIPIDEMIA (ICD-272.4)  Her updated medication list for this problem includes:    Crestor 20 Mg Tabs (Rosuvastatin calcium) .Marland Kitchen... Daily as directed  Problem # 4:  C A D (ICD-414.00) as per Dr Juanda Chance Her updated medication list for this problem includes:    Plavix 75 Mg Tabs (Clopidogrel bisulfate) .Marland Kitchen... 1 by mouth qd    Amlodipine Besy-benazepril Hcl 5-20 Mg Caps (Amlodipine besy-benazepril hcl) .Marland Kitchen... 2 tabs once daily    Hydrochlorothiazide 25 Mg Tabs (Hydrochlorothiazide) .Marland Kitchen... 1 tab by mouth once daily with oj    Metoprolol Succinate 25 Mg Xr24h-tab (Metoprolol succinate) .Marland Kitchen... Take one half  tablet by mouth daily  Orders: EKG w/ Interpretation (93000)  Problem # 5:  HYPOTHYROIDISM (ICD-244.9) off supplement X 4 weeks Her updated medication list for this problem includes:    Levothroid 25 Mcg Tabs (Levothyroxine sodium) .Marland Kitchen... 1 once daily- labs due now  Complete Medication List: 1)  Crestor 20 Mg Tabs (Rosuvastatin calcium) .... Daily as directed 2)  Plavix 75 Mg Tabs (Clopidogrel bisulfate) .Marland Kitchen.. 1 by mouth qd 3)  Amlodipine Besy-benazepril Hcl 5-20 Mg Caps (Amlodipine besy-benazepril hcl) .... 2 tabs once daily 4)  Asa 81mg  1 Po Qd  5)  Vit E  6)  Vit C  7)  Multivitamin  8)  Fish Oil  9)  Hydrochlorothiazide 25 Mg Tabs (Hydrochlorothiazide) .Marland Kitchen.. 1 tab by mouth once daily with oj 10)  Levothroid 25 Mcg Tabs (Levothyroxine sodium) .Marland Kitchen.. 1 once daily- labs due now 11)  Metoprolol Succinate 25 Mg Xr24h-tab (Metoprolol succinate) .... Take one half  tablet by mouth daily  Other Orders: Pneumococcal Vaccine (60454) Admin 1st Vaccine (09811)  Patient Instructions: 1)  Have  fasting labs done @ Dr Regino Schultze appt as you have been OFF THYROID for 4 weeks(see Diagnoses for Codes): 2)  BMP ; 3)  Hepatic Panel; 4)  Lipid Panel ; 5)  TSH ; 6)  CBC w/ Diff. 7)  It is important that you exercise regularly at least 20 minutes 5 times a week. If you develop chest pain, have severe difficulty breathing, or feel very tired , stop exercising immediately and seek medical attention. 8)  Take an Aspirin every day. 9)  Choose your Health care Power of Attorney and/or prepare a Living Will. 10)  Check your  Blood Pressure regularly. If it is above: 135/85 ON AVERAGE you should make an appointment. Prescriptions: AMLODIPINE BESY-BENAZEPRIL HCL 5-20 MG CAPS (AMLODIPINE BESY-BENAZEPRIL HCL) 2 tabs once daily  #60 Capsule x 1   Entered and Authorized by:   Marga Melnick MD   Signed by:   Marga Melnick MD on 08/31/2009   Method used:   Print then Give to Patient   RxID:   [id number] HYDROCHLOROTHIAZIDE 25 MG  TABS (HYDROCHLOROTHIAZIDE) 1 tab by mouth once daily with OJ  #90 x 1   Entered and Authorized by:   Marga Melnick MD   Signed by:   Marga Melnick MD on 08/31/2009   Method used:   Print then Give to Patient   RxID:   [id number] PLAVIX 75 MG TABS (CLOPIDOGREL BISULFATE) 1 by mouth qd  #30 x 1   Entered and Authorized by:   Marga Melnick MD   Signed by:   Marga Melnick MD on 08/31/2009   Method used:   Print then Give to Patient   RxID:   [id number] LEVOTHROID 25 MCG  TABS (LEVOTHYROXINE SODIUM) 1 once daily- LABS DUE NOW  #90 x 0   Entered and Authorized by:   Marga Melnick MD   Signed by:   Marga Melnick MD on 08/31/2009   Method used:   Print then Give to Patient   RxID:   [id number]    Immunizations Administered:  Pneumonia Vaccine:    Vaccine Type: Pneumovax (Medicare)    Site: right deltoid    Mfr: Merck    Dose: 0.5 ml    Route: IM    Given by: Yvonne Bailey    Exp. Date: 03/28/2011    Lot #: [id number]

## 2010-04-10 NOTE — Letter (Signed)
Summary: Results Follow up Letter  [facility] [place]  [address] [place], [place] [address]   Phone: [phone]  Fax: [phone]    09/04/2007 MRN: [id number]  CRISSIE ALOI [address] [place], [place]  [address]  Dear Ms. Leth,  The following are the results of your recent test(s):  Test         Result    Pap Smear:        Normal _____  Not Normal _____ Comments: ______________________________________________________ Cholesterol: LDL(Bad cholesterol):         Your goal is less than:         HDL (Good cholesterol):       Your goal is more than: Comments:  ______________________________________________________ Mammogram:        Normal _____  Not Normal _____ Comments:  ___________________________________________________________________ Hemoccult:        Normal _____  Not normal _______ Comments:    _____________________________________________________________________ Other Tests:  PLEASE SEE ATTACHED LABS AND COMMENTS  We routinely do not discuss normal results over the telephone.  If you desire a copy of the results, or you have any questions about this information we can discuss them at your next office visit.   Sincerely,

## 2010-04-10 NOTE — Progress Notes (Signed)
Summary: lab & ov pending - dr hopper  Phone Note Call from Patient Call back at Home Phone [phone]   Caller: Patient Summary of Call: fyi  -  lab appt [id number] - follow up ov  [id number] Initial call taken by: Okey Regal [place],  November 14, 2006 1:49 PM

## 2010-04-10 NOTE — Consult Note (Signed)
Summary: [facility]   Imported By: Lanelle Bal 09/20/2009 10:26:58  _____________________________________________________________________  External Attachment:    Type:   Image     Comment:   External Document

## 2010-04-10 NOTE — Letter (Signed)
Summary: Results Follow up Letter  [facility] [place]  [address] [place], [place] [address]   Phone: [phone]  Fax: [phone]    02/23/2007 MRN: [id number]  SHALONDA SACHSE [address] [place], [place]  [address]  Dear Ms. Wee,  The following are the results of your recent test(s):  Test         Result    Pap Smear:        Normal _____  Not Normal _____ Comments: ______________________________________________________ Cholesterol: LDL(Bad cholesterol):         Your goal is less than:         HDL (Good cholesterol):       Your goal is more than: Comments:  ______________________________________________________ Mammogram:        Normal _____  Not Normal _____ Comments:  ___________________________________________________________________ Hemoccult:        Normal _____  Not normal _______ Comments:    _____________________________________________________________________ Other Tests:  Please see attached results and comments   We routinely do not discuss normal results over the telephone.  If you desire a copy of the results, or you have any questions about this information we can discuss them at your next office visit.   Sincerely,

## 2010-04-10 NOTE — Letter (Signed)
Summary: [facility]  [facility]   Imported By: Lanelle Bal 11/02/2008 13:41:29  _____________________________________________________________________  External Attachment:    Type:   Image     Comment:   External Document

## 2010-04-10 NOTE — Letter (Signed)
Summary: [facility]  [facility]   Imported By: Lanelle Bal 11/30/2008 09:21:24  _____________________________________________________________________  External Attachment:    Type:   Image     Comment:   External Document

## 2010-04-10 NOTE — Letter (Signed)
Summary: Results Follow up Letter  [facility] [place]  [address] [place], [place] [address]   Phone: [phone]  Fax: [phone]    11/06/2006 MRN: [id number]  Yvonne Bailey [address] [place], [place]  [address]  Dear Ms. Springs,  The following are the results of your recent test(s):  Test         Result    Pap Smear:        Normal _____  Not Normal _____ Comments: ______________________________________________________ Cholesterol: LDL(Bad cholesterol):         Your goal is less than:         HDL (Good cholesterol):       Your goal is more than: Comments:  ______________________________________________________ Mammogram:        Normal _____  Not Normal _____ Comments:  ___________________________________________________________________ Hemoccult:        Normal _____  Not normal _______ Comments:    _____________________________________________________________________ Other Tests:  Please see attached results and comments   We routinely do not discuss normal results over the telephone.  If you desire a copy of the results, or you have any questions about this information we can discuss them at your next office visit.   Sincerely,

## 2010-04-10 NOTE — Assessment & Plan Note (Signed)
Summary: CPX-Fasting Labs   Vital Signs:  Patient Profile:   70 Years Old Female Height:     66 inches Weight:      198.6 pounds Pulse rate:   60 / minute Resp:     16 per minute BP sitting:   144 / 80  (left arm)  Vitals Entered By: Doristine Devoid (October 26, 2007 11:27 AM)                  Chief Complaint:  CPX.  History of Present Illness: BP @ home 130/ 60-70   General Medical HPI:        She is doing well at this time and is not having any significant new complaints.  Lipid Management History:      Positive NCEP/ATP III risk factors include female age 30 years old or older, hypertension, ASHD (atherosclerotic heart disease), prior stroke (or TIA), and peripheral vascular disease.  Negative NCEP/ATP III risk factors include non-diabetic, no family history for ischemic heart disease, non-tobacco-user status, and no history of aortic aneurysm.       Current Allergies: ! * PLETAL  Past Medical History:    MI 72 ; TIA ,Rx coumadin until Plavix initiated 2006; G 1 P 1    Hyperlipidemia    Hypertension    Hypothyroidism    DVT    CAD  Past Surgical History:    Fracture LLE  1994, pinned ; cath post MI 1992, Dr Charlies Constable    Appendectomy with TAH    Hysterectomy & USO for fibroids    Tonsillectomy    Colonoscopy 2009 neg, Dr Lina Sar   Social History:    No diet    Review of Systems  General      Denies chills, fatigue, sleep disorder, sweats, and weight loss.  Eyes      Denies blurring, double vision, and vision loss-both eyes.  ENT      Denies decreased hearing, difficulty swallowing, hoarseness, nasal congestion, ringing in ears, and sinus pressure.  CV      Denies bluish discoloration of lips or nails, chest pain or discomfort, difficulty breathing at night, difficulty breathing while lying down, palpitations, shortness of breath with exertion, and swelling of hands.      Occa claudication based on distance walked; this has improved with  CVE. Occa edema LLE (S/P fractured ankle,pinned)  Resp      Denies cough, excessive snoring, hypersomnolence, morning headaches, shortness of breath, and sputum productive.  GI      Denies abdominal pain, bloody stools, change in bowel habits, constipation, dark tarry stools, indigestion, nausea, and vomiting.  GU      Denies dysuria and hematuria.      Nocturia 2-3X/ night  MS      Denies joint pain, joint redness, joint swelling, low back pain, mid back pain, muscle aches, muscle weakness, and thoracic pain.  Derm      Denies changes in nail beds, dryness, hair loss, lesion(s), and rash.  Neuro      Denies disturbances in coordination, headaches, numbness, poor balance, and tingling.  Psych      Denies anxiety, depression, easily angered, easily tearful, and irritability.  Endo      Denies cold intolerance, excessive hunger, excessive thirst, excessive urination, heat intolerance, polyuria, and weight change.  Heme      Denies abnormal bruising and bleeding.      on Plavix  Allergy      Denies  itching eyes, seasonal allergies, and sneezing.   Physical Exam  General:     well-nourished,in no acute distress; alert,appropriate and cooperative throughout examination; appears younger than age Head:     Normocephalic and atraumatic without obvious abnormalities.  Eyes:     No corneal or conjunctival inflammation noted.Perrla. Funduscopic exam benign, without hemorrhages, exudates or papilledema.  Ears:     External ear exam shows no significant lesions or deformities.  Otoscopic examination reveals clear canals, tympanic membranes are intact bilaterally without bulging, retraction, inflammation or discharge. Hearing is grossly normal bilaterally. Nose:     External nasal examination shows no deformity or inflammation. Nasal mucosa are pink and moist without lesions or exudates. Mouth:     Oral mucosa and oropharynx without lesions or exudates.  Upper plate Neck:     No  deformities, masses, or tenderness noted. Lungs:     Normal respiratory effort, chest expands symmetrically. Lungs are clear to auscultation, no crackles or wheezes. Heart:     bradycardia and grade 1 /6 systolic murmur.   Abdomen:     Bowel sounds positive,abdomen soft and non-tender without masses, organomegaly or hernias noted. Rectal:     Colonoscopy 2009 neg Genitalia:     as per Dr Arlyce Dice Msk:     No deformity or scoliosis noted of thoracic or lumbar spine.   Pulses:     R and L carotid,radial pulses are full and equal bilaterally. Decreased pedal pulses w/o ischemic changes Extremities:     No clubbing, cyanosis, edema.Scar tissue L ankle Neurologic:     alert & oriented X3 and DTRs symmetrical and normal.   Skin:     Intact without suspicious lesions or rashes Cervical Nodes:     No lymphadenopathy noted Axillary Nodes:     No palpable lymphadenopathy Psych:     memory intact for recent and remote, normally interactive, good eye contact, not anxious appearing, and not depressed appearing.      Impression & Recommendations:  Problem # 1:  ROUTINE GENERAL MEDICAL EXAM@HEALTH  CARE FACL (ICD-V70.0)  Orders: UA Dipstick w/o Micro (manual) ([address])   Problem # 2:  HYPERTENSION, ESSENTIAL NOS (ICD-401.9)  Her updated medication list for this problem includes:    Amlodipine Besy-benazepril Hcl 10-20 Mg Caps (Amlodipine besy-benazepril hcl) .Marland Kitchen... Take 1 capsule by mouth every day    Metoprolol Succinate 50 Mg Tb24 (Metoprolol succinate) .Marland Kitchen... 1 by mouth qd    Hydrochlorothiazide 25 Mg Tabs (Hydrochlorothiazide) .Marland Kitchen... 1/2 by mouth once daily with oj   Problem # 3:  HYPERLIPIDEMIA (ICD-272.4)  Her updated medication list for this problem includes:    Crestor 10 Mg Tabs (Rosuvastatin calcium) .Marland Kitchen... 1 by mouth qhs  Orders: TLB-Lipid Panel (80061-LIPID) TLB-Hepatic/Liver Function Pnl (80076-HEPATIC)   Problem # 4:  PERIPHERAL VASCULAR DISEASE (ICD-443.9) intermittent  claudicationHer updated medication list for this problem includes:    Plavix 75 Mg Tabs (Clopidogrel bisulfate) .Marland Kitchen... 1 by mouth qd  Her updated medication list for this problem includes:    Plavix 75 Mg Tabs (Clopidogrel bisulfate) .Marland Kitchen... 1 by mouth qd   Problem # 5:  HYPOTHYROIDISM (ICD-244.9)  Her updated medication list for this problem includes:    Levothroid 25 Mcg Tabs (Levothyroxine sodium) .Marland Kitchen... 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 1 by  mouth qd    Amlodipine Besy-benazepril Hcl 10-20 Mg Caps (Amlodipine besy-benazepril hcl) .Marland Kitchen... Take 1 capsule by mouth every day    Metoprolol Succinate 50 Mg Tb24 (Metoprolol succinate) .Marland Kitchen... 1 by mouth qd    Hydrochlorothiazide 25 Mg Tabs (Hydrochlorothiazide) .Marland Kitchen... 1/2 by mouth once daily with oj   Complete Medication List: 1)  Crestor 10 Mg Tabs (Rosuvastatin calcium) .Marland Kitchen.. 1 by mouth qhs 2)  Plavix 75 Mg Tabs (Clopidogrel bisulfate) .Marland Kitchen.. 1 by mouth qd 3)  Amlodipine Besy-benazepril Hcl 10-20 Mg Caps (Amlodipine besy-benazepril hcl) .... Take 1 capsule by mouth every day 4)  Asa 81mg  1 Po Qd  5)  Vit E  6)  Vit C  7)  Multivitamin  8)  Fish Oil  9)  Metoprolol Succinate 50 Mg Tb24 (Metoprolol succinate) .Marland Kitchen.. 1 by mouth qd 10)  Hydrochlorothiazide 25 Mg Tabs (Hydrochlorothiazide) .... 1/2 by mouth once daily with oj 11)  Levothroid 25 Mcg Tabs (Levothyroxine sodium) .Marland Kitchen.. 1 qd  Other Orders: Venipuncture (09811) TLB-BMP (Basic Metabolic Panel-BMET) (80048-METABOL) TLB-CBC Platelet - w/Differential (85025-CBCD)  Lipid Assessment/Plan:      Based on NCEP/ATP III, the patient's risk factor category is "history of coronary disease, peripheral vascular disease, cerebrovascular disease, or aortic aneurysm".  From this information, the patient's  calculated lipid goals are as follows: Total cholesterol goal is 200; LDL cholesterol goal is 100; HDL cholesterol goal is 40; Triglyceride goal is 150.  Her LDL cholesterol goal has been met.     Patient Instructions: 1)  Check your Blood Pressure regularly. If it is above:130/85 ON AVERAGE the generic Lotrel dose will need to be increased to 10/40 .   ] Laboratory Results   Urine Tests   Date/Time Reported: October 26, 2007 1:04 PM   Routine Urinalysis   Color: colorless Appearance: Clear Glucose: negative   (Normal Range: Negative) Bilirubin: negative   (Normal Range: Negative) Ketone: negative   (Normal Range: Negative) Spec. Gravity: <1.005   (Normal Range: 1.003-1.035) Blood: negative   (Normal Range: Negative) pH: 5.0   (Normal Range: 5.0-8.0) Protein: negative   (Normal Range: Negative) Urobilinogen: negative   (Normal Range: 0-1) Nitrite: negative   (Normal Range: Negative) Leukocyte Esterace: negative   (Normal Range: Negative)    CommentsFloydene Flock CMA  October 26, 2007 1:04 PM

## 2010-04-10 NOTE — Assessment & Plan Note (Signed)
Summary: MEDS/KDC   Vital Signs:  Patient profile:   70 year old female Weight:      200.0 pounds Pulse rate:   58 / minute Resp:     17 per minute BP sitting:   120 / 78  (left arm) Cuff size:   large  Vitals Entered By: Shonna Chock (August 10, 2008 1:26 PM) CC: FOLLOW-UP ON MEDS, Hypertension Management, Lipid Management   CC:  FOLLOW-UP ON MEDS, Hypertension Management, and Lipid Management.  History of Present Illness: Here for med adjustment. Lipid & HTN  Management  reviewed. She felt BP may need to be increased.  Hypertension History:      She complains of peripheral edema, but denies headache, chest pain, palpitations, dyspnea with exertion, orthopnea, PND, visual symptoms, neurologic problems, syncope, and side effects from treatment.  She notes no problems with any antihypertensive medication side effects.  BP @ home 140-150/ 66-80. Repeat BP 165/80. Occa edema @ end of day.        Positive major cardiovascular risk factors include female age 76 years old or older, hyperlipidemia, and hypertension.  Negative major cardiovascular risk factors include no history of diabetes, negative family history for ischemic heart disease, and non-tobacco-user status.        Positive history for target organ damage include ASHD (either angina/prior MI/prior CABG), prior stroke (or TIA), and peripheral vascular disease.    Lipid Management History:      Positive NCEP/ATP III risk factors include female age 43 years old or older, hypertension, ASHD (either angina/prior MI/prior CABG), prior stroke (or TIA), and peripheral vascular disease.  Negative NCEP/ATP III risk factors include non-diabetic, no family history for ischemic heart disease, non-tobacco-user status, and no history of aortic aneurysm.      Allergies: 1)  ! * Pletal  Past History:  Past Medical History: MI 71 ; TIA ,Rx coumadin until Plavix initiated 2006; G 1 P 1 Hyperlipidemia Hypertension Hypothyroidism DVT X1 CAD   Past Surgical History: Fracture LLE  1994, pinned ; cath post MI 1992, Dr Charlies Constable Appendectomy with TAH Hysterectomy & USO for fibroids, Dr Kizzie Bane Tonsillectomy Colonoscopy 2009 neg, Dr Lina Sar  Review of Systems General:  Denies fatigue and sleep disorder. Eyes:  Denies blurring, double vision, and vision loss-both eyes. ENT:  Denies difficulty swallowing and hoarseness. CV:  Denies leg cramps with exertion. Resp:  Denies excessive snoring, hypersomnolence, and morning headaches. GI:  Denies constipation and diarrhea. MS:  ? due for BMD . Derm:  Denies changes in nail beds, dryness, and hair loss. Neuro:  Denies numbness and tingling. Endo:  Complains of heat intolerance; denies cold intolerance, excessive hunger, excessive thirst, and excessive urination; Hot flashes.  Physical Exam  General:  well-nourished,in no acute distress; alert,appropriate and cooperative throughout examination Eyes:  No corneal or conjunctival inflammation noted.Perrla. No lid lag Neck:  No deformities, masses, or tenderness noted. Lungs:  Normal respiratory effort, chest expands symmetrically. Lungs are clear to auscultation, no crackles or wheezes. Heart:  normal rate, regular rhythm, no gallop, no rub, no JVD, no HJR, and grade 1/2-1 /6 systolic murmur.   Repeat BP 165/80 Abdomen:  Bowel sounds positive,abdomen soft and non-tender without masses, organomegaly or hernias noted. No AAA or bruits Pulses:  R and L carotid,radial pulses are full and equal bilaterally. Decreased pedal pulses. R carotid bruit Extremities:  No clubbing, cyanosis, edema Neurologic:  alert & oriented X3 and DTRs symmetrical and normal.   Skin:  Intact without suspicious lesions or rashes Psych:  memory intact for recent and remote, normally interactive, and good eye contact.     Impression & Recommendations:  Problem # 1:  HYPERTENSION, ESSENTIAL NOS (ICD-401.9)  Her updated medication list for this problem  includes:    Metoprolol Succinate 50 Mg Tb24 (Metoprolol succinate) .Marland Kitchen... 1 by mouth qd    Hydrochlorothiazide 25 Mg Tabs (Hydrochlorothiazide) .Marland Kitchen... 1/2 by mouth once daily with oj  Problem # 2:  CAROTID BRUIT, RIGHT (ICD-785.9)  Orders: Misc. Referral (Misc. Ref)  Problem # 3:  HYPERLIPIDEMIA (ICD-272.4)  Her updated medication list for this problem includes:    Crestor 20 Mg Tabs (Rosuvastatin calcium) .Marland Kitchen... 1/2 by mouth once daily  Problem # 4:  HYPOTHYROIDISM (ICD-244.9)  Her updated medication list for this problem includes:    Levothroid 25 Mcg Tabs (Levothyroxine sodium) .Marland Kitchen... 1 qd  Complete Medication List: 1)  Crestor 20 Mg Tabs (Rosuvastatin calcium) .... 1/2 by mouth once daily 2)  Plavix 75 Mg Tabs (Clopidogrel bisulfate) .Marland Kitchen.. 1 by mouth qd 3)  Amlodipine Besy-benazepril Hcl 10-40 Mg Caps  .... Take 1 capsule by mouth every day 4)  Asa 81mg  1 Po Qd  5)  Vit E  6)  Vit C  7)  Multivitamin  8)  Fish Oil  9)  Metoprolol Succinate 50 Mg Tb24 (Metoprolol succinate) .Marland Kitchen.. 1 by mouth qd 10)  Hydrochlorothiazide 25 Mg Tabs (Hydrochlorothiazide) .... 1/2 by mouth once daily with oj 11)  Levothroid 25 Mcg Tabs (Levothyroxine sodium) .Marland Kitchen.. 1 qd  Other Orders: Radiology Referral (Radiology)  Hypertension Assessment/Plan:      The patient's hypertensive risk group is category C: Target organ damage and/or diabetes.  Today's blood pressure is 120/78.    Lipid Assessment/Plan:      Based on NCEP/ATP III, the patient's risk factor category is "history of coronary disease, peripheral vascular disease, cerebrovascular disease, or aortic aneurysm".  The patient's lipid goals are as follows: Total cholesterol goal is 200; LDL cholesterol goal is 100; HDL cholesterol goal is 40; Triglyceride goal is 150.  Her LDL cholesterol goal has been met.    Patient Instructions: 1)  Check your Blood Pressure regularly. If it is above: 135/85 ON AVERAGE  on increased dose of Amlodipine  /Benazepril you should make an appointment. Prescriptions: LEVOTHROID 25 MCG  TABS (LEVOTHYROXINE SODIUM) 1 qd  #90 x 1   Entered and Authorized by:   Marga Melnick MD   Signed by:   Marga Melnick MD on 08/10/2008   Method used:   Print then Give to Patient   RxID:   [id number] AMLODIPINE BESY-BENAZEPRIL HCL 10-40 MG  CAPS TAKE 1 CAPSULE BY MOUTH EVERY DAY  #30 x 5   Entered and Authorized by:   Marga Melnick MD   Signed by:   Marga Melnick MD on 08/10/2008   Method used:   Print then Give to Patient   RxID:   [id number]

## 2010-04-10 NOTE — Assessment & Plan Note (Signed)
Summary: review lab/cbs   Vital Signs:  Patient Profile:   70 Years Old Female Weight:      194.13 pounds Pulse rate:   56 / minute Pulse rhythm:   regular BP sitting:   110 / 64  (left arm) Cuff size:   large  Pt. in pain?   no  Vitals Entered By: Wendall Stade (December 04, 2006 1:35 PM)                  Chief Complaint:  follow up labs.  History of Present Illness: A1c = 5.2 %; TSH 8.24. Weight down with CVE; no bowel changes, palpitations, change in hair , skin or nails. Mother had goiter.  Current Allergies (reviewed today): No known allergies  Updated/Current Medications (including changes made in today's visit):  CRESTOR 10 MG TABS (ROSUVASTATIN CALCIUM) 1 by mouth qhs PLAVIX 75 MG TABS (CLOPIDOGREL BISULFATE) 1 by mouth qd AMLODIPINE BESY-BENAZEPRIL HCL 10-20 MG  CAPS (AMLODIPINE BESY-BENAZEPRIL HCL) TAKE 1 CAPSULE BY MOUTH EVERY DAY * ASA 81MG  1 PO QD  * VIT E  * VIT C  * MULTIVITAMIN  * FISH OIL  METOPROLOL SUCCINATE 50 MG  TB24 (METOPROLOL SUCCINATE) 1 by mouth qd HYDROCHLOROTHIAZIDE 25 MG  TABS (HYDROCHLOROTHIAZIDE) 1/2 by mouth once daily with OJ LEVOTHROID 25 MCG  TABS (LEVOTHYROXINE SODIUM) 1 qd       Physical Exam  General:     Well-developed,well-nourished,in no acute distress; alert,appropriate and cooperative throughout examination Neck:     No deformities, masses, or tenderness noted. Heart:     normal rate, regular rhythm, no gallop, no rub, no JVD, and grade  1/2 -1 /6 systolic murmur.   Neurologic:     DTRs symmetrical and normal.   Skin:     Intact without suspicious lesions or rashes. Biopsy site L thumb healing well    Impression & Recommendations:  Problem # 1:  HYPOTHYROIDISM (ICD-244.9)  Her updated medication list for this problem includes:    Levothroid 25 Mcg Tabs (Levothyroxine sodium) .Marland Kitchen... 1 qd   Complete Medication List: 1)  Crestor 10 Mg Tabs (Rosuvastatin calcium) .Marland Kitchen.. 1 by mouth qhs 2)  Plavix 75 Mg Tabs  (Clopidogrel bisulfate) .Marland Kitchen.. 1 by mouth qd 3)  Amlodipine Besy-benazepril Hcl 10-20 Mg Caps (Amlodipine besy-benazepril hcl) .... Take 1 capsule by mouth every day 4)  Asa 81mg  1 Po Qd  5)  Vit E  6)  Vit C  7)  Multivitamin  8)  Fish Oil  9)  Metoprolol Succinate 50 Mg Tb24 (Metoprolol succinate) .Marland Kitchen.. 1 by mouth qd 10)  Hydrochlorothiazide 25 Mg Tabs (Hydrochlorothiazide) .... 1/2 by mouth once daily with oj 11)  Levothroid 25 Mcg Tabs (Levothyroxine sodium) .Marland Kitchen.. 1 qd   Patient Instructions: 1)  Please schedule a follow-up appointment in 3 months. 2)  TSH prior to visit, ICD-9: 244.9    Prescriptions: LEVOTHROID 25 MCG  TABS (LEVOTHYROXINE SODIUM) 1 qd  #90 x 0   Entered and Authorized by:   Marga Melnick MD   Signed by:   Marga Melnick MD on 12/04/2006   Method used:   Print then Give to Patient   RxID:   [id number]  ]

## 2010-04-10 NOTE — Letter (Signed)
Summary: Results Follow up Letter  [facility]  [address] [place], [place] [address]   Phone: [phone]  Fax: [phone]    12/01/2006 MRN: [id number]  MANNAT BENEDETTI [address] [place], [place]  [address]  Dear Ms. Spagnoli,  The following are the results of your recent test(s):  Test         Result    Pap Smear:        Normal _____  Not Normal _____ Comments: ______________________________________________________ Cholesterol: LDL(Bad cholesterol):         Your goal is less than:         HDL (Good cholesterol):       Your goal is more than: Comments:  ______________________________________________________ Mammogram:        Normal _____  Not Normal _____ Comments:  ___________________________________________________________________ Hemoccult:        Normal _____  Not normal _______ Comments:    _____________________________________________________________________ Other Tests:  Please see attached results and comments   We routinely do not discuss normal results over the telephone.  If you desire a copy of the results, or you have any questions about this information we can discuss them at your next office visit.   Sincerely,

## 2010-04-10 NOTE — Miscellaneous (Signed)
Summary: BONE DENSITY  Clinical Lists Changes  Orders: Added new Test order of T-Bone Densitometry (77080) - Signed Added new Test order of T-Lumbar Vertebral Assessment (77082) - Signed 

## 2010-04-10 NOTE — Letter (Signed)
Summary: [facility]  [facility]   Imported By: Lanelle Bal 12/27/2008 08:44:59  _____________________________________________________________________  External Attachment:    Type:   Image     Comment:   External Document

## 2010-04-10 NOTE — Letter (Signed)
Summary: Results Follow up Letter  [facility]  [address] [place], [place] [address]   Phone: [phone]  Fax: [phone]    09/15/2008 MRN: [id number]  Yvonne Bailey [address] [place], [place]  [address]  Dear [name],  The following are the results of your recent test(s):  Test         Result    Pap Smear:        Normal _____  Not Normal _____ Comments: ______________________________________________________ Cholesterol: LDL(Bad cholesterol):         Your goal is less than:         HDL (Good cholesterol):       Your goal is more than: Comments:  ______________________________________________________ Mammogram:        Normal _____  Not Normal _____ Comments:  ___________________________________________________________________ Hemoccult:        Normal _____  Not normal _______ Comments:    _____________________________________________________________________ Other Tests: PLEASE SEE ATTACHED BONE DENSITY DONE ON 08/23/2008    We routinely do not discuss normal results over the telephone.  If you desire a copy of the results, or you have any questions about this information we can discuss them at your next office visit.   Sincerely,

## 2010-07-24 NOTE — Assessment & Plan Note (Signed)
[facility]                         GASTROENTEROLOGY OFFICE NOTE   AMMARIE, MATSUURA                  MRN:          [id number]  DATE:11/14/2006                            DOB:          1940-05-13    HISTORY OF PRESENT ILLNESS:  Yvonne Bailey is a 70 year old African  American female who is here to discuss colonoscopy.  She has a history  of coronary artery disease, anterior MI in 74.  Initial ejection  fraction 35-40%.  Most recently up to 45%.  She also has peripheral  vascular disease and history of TIA necessitating Coumadinization in the  past.  Coumadin was discontinued several years ago, and she has been  switched to Plavix on which she stays on daily basis.  She has not  discontinued her Plavix at all.  We saw [name] in 1998 for  colonoscopy for evaluation of heme positive stool.  She was on Coumadin  at that time.  She was referred by her gynecologist.  Colonoscopy in  September 1998 showed mild diverticulosis of the left colon and internal  hemorrhoids which were most likely cause of her rectal bleeding.  She  currently denies any rectal problems, abdominal pain or change in her  bowel habits.   MEDICATIONS:  1. Fish oil daily.  2. Multivitamins.  3. Vitamin E.  4. HCTZ 25 mg p.o. daily.  5. Amlodipine 10/20 one p.o. daily.  6. Metoprolol 50 mg p.o. daily.  7. Plavix 75 mg p.o. daily.  8. Crestor 10 mg p.o. daily.  9. Aspirin 81 mg p.o. daily.   PHYSICAL EXAMINATION:  VITAL SIGNS:  Blood pressure 120/72, pulse 48,  weight 195 pounds.  GENERAL:  She was alert, oriented, no distress.  LUNGS:  Clear to auscultation .  COR:  S1, S2 normal.  ABDOMEN:  Soft, nontender with normoactive bowel sounds.  RECTAL:  Exam not done.  The patient just completed her hemoccult cards  with Dr. Alwyn Ren which were negative.   IMPRESSION:  30. A 70 year old African American female with stable heart disease on      chronic Plavix for TIA.  2.  She is at low risk for colon cancer because of essentially normal      colonoscopy 10 years ago.   SUGGESTIONS:  According to the guidelines of [employer] Association,  the recommendations would be for her to have colonoscopy every 10 years  which is now.  She would like to go ahead and have the colonoscopy done  for screening purposes.  I would prefer  to keep her on Plavix to avoid possible complications with TIA.  We have  discussed prep as well as conscious sedation.  She will schedule in the  next 4-6 weeks.     [name]. Juanda Chance, MD  Electronically Signed    DMB/MedQ  DD: 11/14/2006  DT: 11/14/2006  Job #: [id number]   cc:   Titus Dubin. Alwyn Ren, MD,FACP,FCCP  Everardo Beals Juanda Chance, MD, [facility]

## 2010-07-24 NOTE — Assessment & Plan Note (Signed)
[facility]                            CARDIOLOGY OFFICE NOTE   Yvonne Bailey, Yvonne Bailey             MRN:          [id number]  DATE:10/06/2006                            DOB:          10-Nov-1940    PRIMARY CARE PHYSICIAN:  Dr. Marga Melnick.   CLINICAL HISTORY:  Yvonne Bailey is 70 years old and has documented  coronary artery disease with an anterior wall infarction treated with  PTCA in 1992.  Yvonne Bailey has had left ventricular dysfunction with ejection  fractions in the range of 35% - 40%.  We repeated her echocardiogram  last year and her ejection fraction was 45% which is a little better.   Yvonne Bailey has been doing quite well, has had no recent chest pain, shortness  of breath or palpitations.  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  Yvonne Bailey has been exercising regularly  at least 3 days a week.   PAST MEDICAL HISTORY:  1. TIAs.  2. Hypertension.  3. Hyperlipidemia.  4. Also has peripheral vascular disease with claudication and reduced      ABIs of 0.51 and 0.70.   CURRENT MEDICATIONS:  Aspirin, Plavix, metoprolol, vitamins, Crestor,  Lotrel, hydrochlorothiazide.   EXAMINATION:  Blood pressure was 150/80 and the pulse 70 and regular.  There was no venous distention.  The carotid pulses were full without  bruits.  CHEST:  Clear.  CARDIAC:  Rhythm was regular.  Hear no murmurs or gallops.  ABDOMEN:  Soft without organomegaly.  I had difficulty feeling pedal pulses.  There was no peripheral edema.   IMPRESSION:  1. Coronary artery disease status post anterior wall myocardial      infarction 1992 treated with percutaneous transluminal coronary      angioplasty.  2. Ejection fraction 45%.  3. History of previous transient ischemic attack.  4. Hyperlipidemia.  5. Peripheral vascular disease.   RECOMMENDATIONS:  I think Yvonne Bailey is doing quite well.  Her  peripheral vascular disease does not seem to cause her  much limitations.  Her blood pressure is high today but Yvonne Bailey says it has been about 120 when  Yvonne Bailey takes it at home.  Costs are an issue with Crestor and Yvonne Bailey asked  about alternatives.  We talked about simvastatin and we talked about  having her take 20 mg half  tablets daily and will probably consider the latter.  I will plan to see  her back in followup in a year or sooner if Yvonne Bailey has any problems.     Bruce Elvera Lennox Juanda Chance, MD, [facility]  Electronically Signed    BRB/MedQ  DD: 10/06/2006  DT: 10/06/2006  Job #: [id number]

## 2010-07-24 NOTE — Assessment & Plan Note (Signed)
[facility]ALTHCARE                            CARDIOLOGY OFFICE NOTE   Yvonne, Bailey                  MRN:          [id number]  DATE:09/29/2007                            DOB:          [birth year]    PRIMARY CARE PHYSICIAN:  Titus Dubin. Alwyn Ren, MD, FACP, [facility]   CLINICAL HISTORY:  Ms. Icenhour is 70 years old and had an anterior wall  infarction in 1992 treated with a stent to the LAD.  She has had LV  dysfunction, but this improved to 45% by last echo.   She also has peripheral vascular disease and has claudication when she  walks upstairs or uphills.  We had decided against further evaluation of  this in the past and her symptoms have not changed.   She said she had no recent chest pain, shortness of breath, or  palpitations.  She is still working part-time substituting as a Nurse, mental health.   Her past medical history is known for hypertension, hyperlipidemia, and  TIAs.   Her current medications include:  1. Fish oil.  2. Vitamins.  3. Hydrochlorothiazide.  4. Lotrel 10/20.  5. Metoprolol 50 mg daily.  6. Plavix 75 mg daily.  7. Aspirin.  8. Crestor 20 mg one-half tablet daily.  9. Levothyroxine 25 mcg daily.   PHYSICAL EXAMINATION:  VITAL SIGNS:  Blood pressure is 156/68 and pulse  is 54 and regular.  NECK:  There is no venous distension.  Carotid pulses were full without  bruits.  CHEST:  Clear.  CARDIAC:  Rhythm is regular.  No murmurs or gallops.  ABDOMEN:  Soft.  Normal bowel sounds.  EXTREMITIES:  Difficult to feel pedal pulses.  There is no peripheral  edema.   Electrocardiogram showed evidence of an old anterior infarction.   IMPRESSION:  1. Coronary artery disease, status post anterior wall myocardial      infarction in 1992, treated with percutaneous transluminal coronary      angioplasty of the left anterior descending.  2. Ejection fraction 45%.  3. History of previous transient ischemic attacks.  4.  Hyperlipidemia.  5. Peripheral vascular disease.   RECOMMENDATIONS:  I think Ms. Perot is doing quite well.  She would  like to come off Crestor for cost reasons and we discussed the pros and  cons of this.  She is due for a lipid profile with Dr. Alwyn Ren soon, so  we will repeat that and then see if Dr. Alwyn Ren can make a decision about  this.  We may not be able to get her at target LDL on simvastatin, but  this may be a compromise that we may need to make.  I will plan to see  her back in follow up in 1 year.     Bruce Elvera Lennox Juanda Chance, MD, [facility]  Electronically Signed    BRB/MedQ  DD: 09/29/2007  DT: 09/30/2007  Job #: [id number]

## 2010-07-27 NOTE — Assessment & Plan Note (Signed)
[facility]ALTHCARE                              CARDIOLOGY OFFICE NOTE   NAME:Yvonne Bailey, Yvonne Bailey                   MRN:          [id number]  DATE:09/30/2005                            DOB:          27-Oct-1940    PRIMARY CARE PHYSICIAN:  Titus Dubin. Alwyn Ren, MD.   CLINICAL HISTORY:  Ms. Yvonne Bailey is 70 years old and, in 1992, suffered an  anterior wall infarction treated with PTCA.  Her last echocardiogram  was  performed in 2004 and showed an ejection fraction of 35-45%.   She says she has been doing well recently with her heart and she has had no  chest pain, shortness of breath, or palpitations.   She also has a history of TIAs and she had taken Coumadin for some time but  then was switched to Plavix.   PAST MEDICAL HISTORY:  1.  Hypertension.  2.  Hyperlipidemia.  3.  Claudication which has been evaluated by Dr. Chales Abrahams in the past.  4.  She has reduced ABIs with 0.51 on the right and 0.70 on the left.   CURRENT MEDICATIONS:  1.  Aspirin.  2.  Plavix.  3.  Metoprolol.  4.  Crestor.  5.  Lotrel 10/20.  6.  Hydrochlorothiazide p.r.n.   PHYSICAL EXAMINATION:  VITAL SIGNS:  Blood pressure is 144/70, pulse 46 and  regular.  NECK:  There was no venous distention.  The carotid pulses were full without  bruits.  CHEST:  Clear.  CARDIOVASCULAR:  Heart rhythm was regular.  I hear no murmurs or gallops.  ABDOMEN:  Soft without organomegaly.  EXTREMITIES:  Pedal pulses were decreased and there was no peripheral edema.   An electrocardiogram showed an old anterior wall infarction and sinus  bradycardia.  This was done by Dr. Alwyn Ren.   There was a bradycardia of 41 at that time.   IMPRESSION:  1.  Coronary artery disease status post anterior wall myocardial infarction      in 1992 treated with percutaneous transluminal coronary angioplasty.  2.  Ischemic cardiomyopathy with ejection fraction of 35-45%.  3.  History of previous transient ischemic  attack.  4.  Peripheral vascular disease with reduced indices in lower extremities.   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   RECOMMENDATIONS:  I think Ms. Yvonne Bailey is stable from a cardiac standpoint.  Her blood pressure is a little bit high and her pulse rate is a little low.  Will cut back her metoprolol and put her on long acting Toprol XL 50 mg one  half tablet daily.  Will add hydrochlorothiazide on a daily basis 25 mg to  help with her blood pressure.  Will have her come back for a 2-D echo to  reevaluate her LV function.  Will get fasting lab work at that time  including a CBC, BNP, lipid and liver profile.  I will see her back in  follow-up in one year if all these tests look okay.  Bruce Elvera Lennox Juanda Chance, MD, [facility]    BRB/MedQ  DD:  09/30/2005  DT:  09/30/2005  Job #:  [id number]

## 2010-08-11 ENCOUNTER — Other Ambulatory Visit: Payer: Self-pay | Admitting: Internal Medicine

## 2010-08-13 ENCOUNTER — Other Ambulatory Visit: Payer: Self-pay | Admitting: *Deleted

## 2010-08-13 MED ORDER — ROSUVASTATIN CALCIUM 20 MG PO TABS: 20.0000 mg | ORAL_TABLET | Freq: Every day | ORAL | Status: DC

## 2010-08-15 ENCOUNTER — Encounter: Payer: Self-pay | Admitting: Cardiovascular Disease

## 2010-09-14 ENCOUNTER — Encounter: Payer: Self-pay | Admitting: Cardiovascular Disease

## 2010-09-14 ENCOUNTER — Other Ambulatory Visit: Payer: Self-pay | Admitting: Internal Medicine

## 2010-09-14 ENCOUNTER — Ambulatory Visit (INDEPENDENT_AMBULATORY_CARE_PROVIDER_SITE_OTHER): Payer: Medicare Other | Admitting: Cardiovascular Disease

## 2010-09-14 VITALS — BP 151/77 | HR 60 | Ht 66.0 in | Wt 211.0 lb

## 2010-09-14 DIAGNOSIS — I739 Peripheral vascular disease, unspecified: Secondary | ICD-10-CM

## 2010-09-14 DIAGNOSIS — I7389 Other specified peripheral vascular diseases: Secondary | ICD-10-CM

## 2010-09-14 DIAGNOSIS — I2510 Atherosclerotic heart disease of native coronary artery without angina pectoris: Secondary | ICD-10-CM

## 2010-09-14 NOTE — Assessment & Plan Note (Signed)
Will get lower ext arterial dopplers. Stable claudication.

## 2010-09-14 NOTE — Assessment & Plan Note (Signed)
Stable No changes 

## 2010-09-14 NOTE — Patient Instructions (Addendum)
Your physician recommends that you schedule a follow-up appointment in: 6 months  

## 2010-09-14 NOTE — Progress Notes (Signed)
History of Present Illness:70 yo female with history of CAD, HTN, PAD,  hyperlipidemia here today for cardiac followup. She has been followed in the past by Dr. Juanda Chance.  In 1992 she had an anterior MI treated with PCI. Her ejection fraction 2007 was 45%. She has been doing quite well over the past year with no chest pain shortness breath or palpitations.  She also has carotid disease and had Doppler studies 2011 which showed 0-39% stenosis bilaterally. She has been known to have PAD but no recent vascular studies.    Past Medical History  Diagnosis Date  . MI (myocardial infarction) 39    at age 62; TIA treated with coumadin until Plavix initiated in 2006  . Hyperlipidemia   . HTN (hypertension)   . Hypothyroidism   . DVT (deep venous thrombosis)     X1  . CAD (coronary artery disease)     R carotid artery bruit with 0-30% blockage. s/p anterior  wall myocardial infarction in '92, treated w/percutaneous transluminal coronary angioplasty of the left anterior descending. EF 45%  . PVD (peripheral vascular disease)     Past Surgical History  Procedure Date  . Fracture lle     '94; pinned; catheterization post MI '92, Dr. Charlies Constable  . Appendectomy     at hysterectomy and USO for fibroids, Dr. Kizzie Bane  . Tonsillectomy   . Colonoscopy     negative; 2009, Dr. Lina Sar    Current Outpatient Prescriptions  Medication Sig Dispense Refill  . amLODipine-benazepril (LOTREL) 5-20 MG per capsule TAKE 2 CAPSULES BY MOUTH DAILY  60 capsule  1  . Ascorbic Acid (VITAMIN C) 100 MG tablet Take 100 mg by mouth daily.        Marland Kitchen aspirin 81 MG tablet Take 81 mg by mouth daily.        . clopidogrel (PLAVIX) 75 MG tablet Take 75 mg by mouth daily.        . fish oil-omega-3 fatty acids 1000 MG capsule Take 2 g by mouth daily.        . hydrochlorothiazide 25 MG tablet Take 25 mg by mouth daily. Take with OJ       . levothyroxine (SYNTHROID, LEVOTHROID) 25 MCG tablet Take 25 mcg by mouth daily.        .  metoprolol succinate (TOPROL-XL) 25 MG 24 hr tablet Take 12.5 mg by mouth daily.        . Multiple Vitamin (MULTIVITAMIN) tablet Take 1 tablet by mouth daily.        . rosuvastatin (CRESTOR) 20 MG tablet Take 1 tablet (20 mg total) by mouth at bedtime.  30 tablet  11  . vitamin E 100 UNIT capsule Take 100 Units by mouth daily.          Allergies  Allergen Reactions  . Cilostazol     REACTION: swelling    History   Social History  . Marital Status: Married    Spouse Name: N/A    Number of Children: N/A  . Years of Education: N/A   Occupational History  . Not on file.   Social History Main Topics  . Smoking status: Former Smoker    Quit date: 03/11/1990  . Smokeless tobacco: Not on file   Comment: quit in 1992  . Alcohol Use: No  . Drug Use: Not on file  . Sexually Active: Not on file   Other Topics Concern  . 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    No family history on file.  Review of Systems:  As stated in the HPI and otherwise negative.   BP 151/77  Pulse 60  Ht 5\' 6"  (1.676 m)  Wt 211 lb (95.709 kg)  BMI 34.06 kg/m2  Physical Examination: General: Well developed, well nourished, NAD HEENT: OP clear, mucus membranes moist SKIN: warm, dry. No rashes. Neuro: No focal deficits Musculoskeletal: Muscle strength 5/5 all ext Psychiatric: Mood and affect normal Neck: No JVD, no carotid bruits, no thyromegaly, no lymphadenopathy. Lungs:Clear bilaterally, no wheezes, rhonci, crackles Cardiovascular: Regular rate and rhythm. No murmurs, gallops or rubs. Abdomen:Soft. Bowel sounds present. Non-tender.  Extremities: No lower extremity edema. Pulses are 2 + in the bilateral DP/PT.  EKG: NSR rate 62 bpm. PVCs. LAD. LVH.

## 2010-09-18 ENCOUNTER — Encounter (INDEPENDENT_AMBULATORY_CARE_PROVIDER_SITE_OTHER): Payer: Medicare Other | Admitting: *Deleted

## 2010-09-18 DIAGNOSIS — I739 Peripheral vascular disease, unspecified: Secondary | ICD-10-CM

## 2010-09-18 DIAGNOSIS — I70219 Atherosclerosis of native arteries of extremities with intermittent claudication, unspecified extremity: Secondary | ICD-10-CM

## 2010-09-18 DIAGNOSIS — I7389 Other specified peripheral vascular diseases: Secondary | ICD-10-CM

## 2010-09-20 ENCOUNTER — Encounter: Payer: Self-pay | Admitting: Cardiovascular Disease

## 2010-09-21 ENCOUNTER — Other Ambulatory Visit: Payer: Self-pay | Admitting: *Deleted

## 2010-09-21 MED ORDER — METOPROLOL SUCCINATE ER 25 MG PO TB24: ORAL_TABLET | ORAL | Status: DC

## 2010-10-18 ENCOUNTER — Other Ambulatory Visit: Payer: Self-pay | Admitting: Internal Medicine

## 2010-10-27 ENCOUNTER — Other Ambulatory Visit: Payer: Self-pay | Admitting: Internal Medicine

## 2010-10-29 ENCOUNTER — Ambulatory Visit (INDEPENDENT_AMBULATORY_CARE_PROVIDER_SITE_OTHER): Payer: Medicare Other | Admitting: Internal Medicine

## 2010-10-29 ENCOUNTER — Encounter: Payer: Self-pay | Admitting: Internal Medicine

## 2010-10-29 DIAGNOSIS — I2510 Atherosclerotic heart disease of native coronary artery without angina pectoris: Secondary | ICD-10-CM

## 2010-10-29 DIAGNOSIS — E785 Hyperlipidemia, unspecified: Secondary | ICD-10-CM

## 2010-10-29 DIAGNOSIS — K5730 Diverticulosis of large intestine without perforation or abscess without bleeding: Secondary | ICD-10-CM

## 2010-10-29 DIAGNOSIS — Z0000 Encounter for general adult medical examination without abnormal findings: Secondary | ICD-10-CM

## 2010-10-29 DIAGNOSIS — E039 Hypothyroidism, unspecified: Secondary | ICD-10-CM

## 2010-10-29 DIAGNOSIS — I10 Essential (primary) hypertension: Secondary | ICD-10-CM

## 2010-10-29 LAB — CBC WITH DIFFERENTIAL/PLATELET
Basophils Absolute: 0.1 10*3/uL (ref 0.0–0.1)
Basophils Relative: 0.6 % (ref 0.0–3.0)
Eosinophils Absolute: 0.2 10*3/uL (ref 0.0–0.7)
Eosinophils Relative: 1.6 % (ref 0.0–5.0)
HCT: 41.6 % (ref 36.0–46.0)
Hemoglobin: 14 g/dL (ref 12.0–15.0)
Lymphocytes Relative: 32.2 % (ref 12.0–46.0)
Lymphs Abs: 3.2 10*3/uL (ref 0.7–4.0)
MCHC: 33.5 g/dL (ref 30.0–36.0)
MCV: 94.1 fl (ref 78.0–100.0)
Monocytes Absolute: 0.7 10*3/uL (ref 0.1–1.0)
Monocytes Relative: 7.4 % (ref 3.0–12.0)
Neutro Abs: 5.7 10*3/uL (ref 1.4–7.7)
Neutrophils Relative %: 58.2 % (ref 43.0–77.0)
Platelets: 239 10*3/uL (ref 150.0–400.0)
RBC: 4.42 Mil/uL (ref 3.87–5.11)
RDW: 13.3 % (ref 11.5–14.6)
WBC: 9.9 10*3/uL (ref 4.5–10.5)

## 2010-10-29 LAB — HEPATIC FUNCTION PANEL
ALT: 16 U/L (ref 0–35)
AST: 21 U/L (ref 0–37)
Albumin: 4.5 g/dL (ref 3.5–5.2)
Alkaline Phosphatase: 116 U/L (ref 39–117)
Bilirubin, Direct: 0 mg/dL (ref 0.0–0.3)
Total Bilirubin: 0.6 mg/dL (ref 0.3–1.2)
Total Protein: 7.6 g/dL (ref 6.0–8.3)

## 2010-10-29 LAB — BASIC METABOLIC PANEL
BUN: 15 mg/dL (ref 6–23)
CO2: 28 mEq/L (ref 19–32)
Calcium: 8.8 mg/dL (ref 8.4–10.5)
Chloride: 104 mEq/L (ref 96–112)
Creatinine, Ser: 1.1 mg/dL (ref 0.4–1.2)
GFR: 63.76 mL/min (ref >60.00)
Glucose, Bld: 92 mg/dL (ref 70–99)
Potassium: 3.9 mEq/L (ref 3.5–5.1)
Sodium: 141 mEq/L (ref 135–145)

## 2010-10-29 LAB — LIPID PANEL
Cholesterol: 129 mg/dL (ref 0–200)
HDL: 56.4 mg/dL (ref >39.00)
LDL Cholesterol: 57 mg/dL (ref 0–99)
Total CHOL/HDL Ratio: 2
Triglycerides: 79 mg/dL (ref 0.0–149.0)
VLDL: 15.8 mg/dL (ref 0.0–40.0)

## 2010-10-29 LAB — TSH: TSH: 2.93 u[IU]/mL (ref 0.35–5.50)

## 2010-10-29 MED ORDER — AMLODIPINE BESY-BENAZEPRIL HCL 5-20 MG PO CAPS: 60.0000 | ORAL_CAPSULE | Freq: Every day | ORAL | Status: DC

## 2010-10-29 MED ORDER — HYDROCHLOROTHIAZIDE 25 MG PO TABS: 25.0000 mg | ORAL_TABLET | Freq: Every day | ORAL | Status: DC

## 2010-10-29 MED ORDER — LEVOTHYROXINE SODIUM 25 MCG PO TABS: 25.0000 ug | ORAL_TABLET | Freq: Every day | ORAL | Status: DC

## 2010-10-29 NOTE — Patient Instructions (Addendum)
Preventive Health Care: Exercise  30-45  minutes a day, 3-4 days a week. Walking is especially valuable in preventing Osteoporosis. Eat a low-fat diet with lots of fruits and vegetables, up to 7-9 servings per day.Consume less than 30 grams of sugar per day from foods & drinks with High Fructose Corn Syrup as # 1,2,3 or #4 on label. Health Care Power of Attorney & Living Will place you in charge of your health care  decisions. Verify these are  in place. .  

## 2010-10-29 NOTE — Progress Notes (Signed)
Subjective:    Patient ID: Yvonne Bailey, female    DOB: Jul 04, 1940, 70 y.o.   MRN: [id number]  HPI Medicare Wellness Visit:  The following psychosocial & medical history were reviewed as required by Medicare.   Social history: caffeine: none , alcohol:  no ,  tobacco use : quit 1992  & exercise : [employer] [employer] 3X/ week for 30-60 min.   Home & personal  safety / fall risk: no issues, activities of daily living: no limitations , seatbelt use : yes , and smoke alarm employment : yes .  Power of Attorney/Living Will status : needed  Vision ( as recorded per Nurse) & Hearing  evaluation :  Last Ophth exam 12 mos ago;wall chart read @6  ft with lenses; slightly decreased to whisper on L. Orientation :oriennted X 3 , memory & recall :good, spelling testing: good,and mood & affect : normal . Depression / anxiety: denied Travel history : never , immunization status :Shingles needed , transfusion history:  no, and preventive health surveillance ( colonoscopies, BMD , etc as per protocol/ [facility]): colonoscopy up to date, Dental care: seen every 6 mos . Chart reviewed &  Updated. Active issues reviewed & addressed.       Review of Systems HYPERTENSION: Disease Monitoring: Blood pressure range-< 140/80  Chest pain, palpitations- no       Dyspnea- no Medications: Compliance- yes  ightheadedness,Syncope- no    Edema- intermittent in LLE ( S/P fracture)   HYPERLIPIDEMIA: Disease Monitoring: See symptoms for Hypertension Medications: Compliance- yes  Abd pain, bowel changes- no   Muscle aches- no    WUJ:[id number]/GNFAOZ/HYQMVH- no       Visual problems- no Hypoglycemic symptoms- no   Hot flashes        Objective:   Physical Exam Gen.: Healthy and well-nourished in appearance. Alert, appropriate and cooperative throughout exam. Appears younger than stated age Head: Normocephalic without obvious abnormalities Eyes: No corneal or conjunctival inflammation noted. Pupils equal  round reactive to light and accommodation. Fundal exam is benign without hemorrhages, exudate, papilledema. Ears: External  ear exam reveals no significant lesions or deformities. Canals clear .TMs normal.  Nose: External nasal exam reveals no deformity or inflammation. Nasal mucosa are pink and moist. No lesions or exudates noted.  Mouth: Oral mucosa and oropharynx reveal no lesions or exudates. Upper plate Neck: No deformities, masses, or tenderness noted. Range of motion &  Thyroid normal. Lungs: Normal respiratory effort; chest expands symmetrically. Lungs are clear to auscultation without rales, wheezes, or increased work of breathing. Heart: Slow rate and regular  rhythm. Normal S1 and S2. No gallop, click, or rub. S4 with slurring; no murmur. Abdomen: Bowel sounds normal; abdomen soft and nontender. No masses, organomegaly or hernias noted. Genitalia:Gyn appt due this year   .                                                                                   Musculoskeletal/extremities: No deformity or scoliosis noted of  the thoracic or lumbar spine. No clubbing, cyanosis, edema, or deformity noted. Range of motion  normal .Tone & strength  normal.Joints normal. Nail health good. Lipomatous changes  L ankle Vascular: Carotid, radial artery are full and equal. R carotid bruit  present. Neurologic: Alert and oriented x3. Deep tendon reflexes symmetrical and normal.          Skin: Intact without suspicious lesions or rashes.Op scar L ankle Lymph: No cervical, axillary lymphadenopathy present. Psych: Mood and affect are normal. Normally interactive                                                                                         Assessment & Plan:  #1 Medicare Wellness Exam; criteria met ; data entered #2 Problem List reviewed ; Assessment/ Recommendations made Plan: see Orders

## 2010-12-04 ENCOUNTER — Other Ambulatory Visit: Payer: Self-pay | Admitting: Internal Medicine

## 2010-12-04 DIAGNOSIS — E039 Hypothyroidism, unspecified: Secondary | ICD-10-CM

## 2010-12-05 NOTE — Telephone Encounter (Signed)
Called patient to clarify pharmacy, rx was sent in on 10/29/2010 for a year supply to [facility], we now have a request from [facility]?

## 2010-12-07 NOTE — Telephone Encounter (Signed)
Left message on voicemail for patient to return call when available   

## 2010-12-10 MED ORDER — LEVOTHYROXINE SODIUM 25 MCG PO TABS: 25.0000 ug | ORAL_TABLET | Freq: Every day | ORAL | Status: DC

## 2010-12-10 NOTE — Telephone Encounter (Signed)
RX sent to [facility], per patient request , called [facility] and cancelled all pending refills on thyroid med

## 2011-01-09 ENCOUNTER — Other Ambulatory Visit: Payer: Self-pay | Admitting: Internal Medicine

## 2011-01-09 DIAGNOSIS — Z1231 Encounter for screening mammogram for malignant neoplasm of breast: Secondary | ICD-10-CM

## 2011-02-05 ENCOUNTER — Ambulatory Visit
Admission: RE | Admit: 2011-02-05 | Discharge: 2011-02-05 | Disposition: A | Discharge status: Home / Self Care | Payer: Medicare Other | Source: Ambulatory Visit | Attending: Internal Medicine | Admitting: Internal Medicine

## 2011-02-05 DIAGNOSIS — Z1231 Encounter for screening mammogram for malignant neoplasm of breast: Secondary | ICD-10-CM

## 2011-04-02 ENCOUNTER — Encounter: Payer: Self-pay | Admitting: Cardiovascular Disease

## 2011-04-02 ENCOUNTER — Ambulatory Visit (INDEPENDENT_AMBULATORY_CARE_PROVIDER_SITE_OTHER): Payer: Medicare Other | Admitting: Cardiovascular Disease

## 2011-04-02 VITALS — BP 152/80 | HR 50 | Ht 66.0 in | Wt 214.8 lb

## 2011-04-02 DIAGNOSIS — I2510 Atherosclerotic heart disease of native coronary artery without angina pectoris: Secondary | ICD-10-CM

## 2011-04-02 DIAGNOSIS — I739 Peripheral vascular disease, unspecified: Secondary | ICD-10-CM | POA: Insufficient documentation

## 2011-04-02 MED ORDER — CLOPIDOGREL BISULFATE 75 MG PO TABS: 75.0000 mg | ORAL_TABLET | Freq: Every day | ORAL | Status: DC

## 2011-04-02 NOTE — Assessment & Plan Note (Addendum)
Stable. Continue ASA, Plavix, beta blocker, statin.

## 2011-04-02 NOTE — Patient Instructions (Signed)
Your physician wants you to follow-up in: 12 months. You will receive a reminder letter in the mail two months in advance. If you don't receive a letter, please call our office to schedule the follow-up appointment.  Your physician has requested that you have an ankle brachial index (ABI). During this test an ultrasound and blood pressure cuff are used to evaluate the arteries that supply the arms and legs with blood. Allow thirty minutes for this exam. There are no restrictions or special instructions. To be done in July.   Your physician has requested that you have a carotid duplex. This test is an ultrasound of the carotid arteries in your neck. It looks at blood flow through these arteries that supply the brain with blood. Allow one hour for this exam. There are no restrictions or special instructions. To be done in July.

## 2011-04-02 NOTE — Progress Notes (Signed)
History of Present Illness: 71 yo female with history of CAD, HTN, PAD, carotid artery disease, hyperlipidemia here today for cardiac followup. She has been followed in the past by Dr. Juanda Chance. I saw her in July 2012.  In 1992 she had an anterior MI treated with PCI. Her ejection fraction in 2007 was 45%.   She has been doing quite well with no chest pain,  shortness breath or palpitations. She also has carotid disease and had Doppler studies 2011 which showed 0-39% stenosis bilaterally. I ordered bilateral lower ext dopplers July 2012 which showed long occlusion of the right SFA and severe stenosis left CFA and SFA with ABI of 0.5 on the right and 0.63 on the left. She tells me today that she only has leg pain if she walks long distances. This has been stable. No rest pain or ulcerations. Overall feeling very well. She does not walk daily. She checks her BP at home and it has been mostly 120-130 systolically.     Past Medical History  Diagnosis Date  . MI (myocardial infarction) 11    at age 47; TIA treated with coumadin until Plavix initiated in 2006  . Hyperlipidemia   . HTN (hypertension)   . Hypothyroidism   . DVT (deep venous thrombosis)     X1  . CAD (coronary artery disease)     S/P  anterior  wall myocardial infarction in '92, treated w/percutaneous transluminal coronary angioplasty of the left anterior descending. EF 45%  . PAD (peripheral artery disease)     Right SFA occlusion, severe disease left CFA and SFA  . Carotid artery disease     Past Surgical History  Procedure Date  . Fracture lle     '94; pinned  . Appendectomy     at hysterectomy and USO for fibroids, Dr. Kizzie Bane  . Tonsillectomy   . Colonoscopy     negative; 2009, Dr. Lina Sar  . Cardiac catheterization 1992    Dr Charlies Constable  . Total abdominal hysterectomy     & BSO for fibroids    Current Outpatient Prescriptions  Medication Sig Dispense Refill  . amLODipine-benazepril (LOTREL) 5-20 MG per  capsule Take 60 capsules by mouth daily.  60 capsule  11  . Ascorbic Acid (VITAMIN C) 100 MG tablet Take 100 mg by mouth daily.        Marland Kitchen aspirin 81 MG tablet Take 81 mg by mouth daily.        . clopidogrel (PLAVIX) 75 MG tablet Take 75 mg by mouth daily.        . fish oil-omega-3 fatty acids 1000 MG capsule Take 2 g by mouth daily.        . hydrochlorothiazide 25 MG tablet Take 1 tablet (25 mg total) by mouth daily. Take with OJ  30 tablet  11  . levothyroxine (SYNTHROID, LEVOTHROID) 25 MCG tablet Take 1 tablet (25 mcg total) by mouth daily.  30 tablet  11  . metoprolol succinate (TOPROL-XL) 25 MG 24 hr tablet Take 1/2 tablet po qd  15 tablet  6  . Multiple Vitamin (MULTIVITAMIN) tablet Take 1 tablet by mouth daily.        . rosuvastatin (CRESTOR) 20 MG tablet Take 1 tablet (20 mg total) by mouth at bedtime.  30 tablet  11  . vitamin E 100 UNIT capsule Take 100 Units by mouth daily.          Allergies  Allergen Reactions  . Cilostazol  Pletal :" head felt funny"    History   Social History  . Marital Status: Married    Spouse Name: N/A    Number of Children: N/A  . Years of Education: N/A   Occupational History  . Not on file.   Social History Main Topics  . Smoking status: Former Smoker    Quit date: 03/11/1990  . Smokeless tobacco: Not on file   Comment: quit in 1992  . Alcohol Use: No  . Drug Use: Not on file  . Sexually Active: Not on file   Other Topics Concern  . 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    Family History  Problem Relation Age of Onset  . Diabetes Mother   . Hypertension Mother   . Stroke Brother     HTN    Review of Systems:  As stated in the HPI and otherwise negative.   BP 152/80  Pulse 50  Ht 5\' 6"  (1.676 m)  Wt 214 lb 12.8 oz (97.433 kg)  BMI 34.67  kg/m2  Physical Examination: General: Well developed, well nourished, NAD HEENT: OP clear, mucus membranes moist SKIN: warm, dry. No rashes. Neuro: No focal deficits Musculoskeletal: Muscle strength 5/5 all ext Psychiatric: Mood and affect normal Neck: No JVD, no carotid bruits, no thyromegaly, no lymphadenopathy. Lungs:Clear bilaterally, no wheezes, rhonci, crackles Cardiovascular: Regular rate and rhythm. No murmurs, gallops or rubs. Abdomen:Soft. Bowel sounds present. Non-tender.  Extremities: No lower extremity edema. Pulses are non-palpable in the bilateral DP/PT.   LE Arterial Doppler July 2012: Right ABI 0.5, Left ABI 0.63. Occlusion right SFA. Severe disease left CFA/SFA.

## 2011-04-02 NOTE — Assessment & Plan Note (Signed)
Stable. Will repeat carotid artery dopplers and LE dopplers in July. Continue daily walking.

## 2011-05-16 ENCOUNTER — Encounter: Payer: Self-pay | Admitting: Family Medicine

## 2011-05-16 ENCOUNTER — Ambulatory Visit (INDEPENDENT_AMBULATORY_CARE_PROVIDER_SITE_OTHER): Payer: Medicare Other | Admitting: Family Medicine

## 2011-05-16 ENCOUNTER — Telehealth: Payer: Self-pay | Admitting: Internal Medicine

## 2011-05-16 VITALS — BP 138/84 | HR 66 | Temp 98.1°F | Wt 215.6 lb

## 2011-05-16 DIAGNOSIS — I10 Essential (primary) hypertension: Secondary | ICD-10-CM

## 2011-05-16 DIAGNOSIS — R350 Frequency of micturition: Secondary | ICD-10-CM

## 2011-05-16 MED ORDER — BENAZEPRIL HCL 20 MG PO TABS: ORAL_TABLET | ORAL | Status: DC

## 2011-05-16 MED ORDER — AMLODIPINE BESYLATE 5 MG PO TABS: 5.0000 mg | ORAL_TABLET | Freq: Every day | ORAL | Status: DC

## 2011-05-16 NOTE — Telephone Encounter (Signed)
Patient called stating her BP med amlopdipine is too expensive. She states it works well, but she would like something less expensive. I advised pt that Dr. Alwyn Ren is out of the office and it might be next week before this is addressed.

## 2011-05-16 NOTE — Telephone Encounter (Signed)
Left message to call office

## 2011-05-16 NOTE — Progress Notes (Signed)
  Subjective:    Yvonne Bailey is a 71 y.o. female who complains of frequency and urgency. She has had symptoms for 2 weeks. Patient also complains of back pain. Patient denies congestion, cough, fever, headache, rhinitis, sorethroat, stomach ache and vaginal discharge. Patient does not have a history of recurrent UTI. Patient does not have a history of pyelonephritis.   The following portions of the patient's history were reviewed and updated as appropriate: allergies, current medications, past family history, past medical history, past social history, past surgical history and problem list.  Review of Systems Pertinent items are noted in HPI.    Objective:    BP 138/84  Pulse 66  Temp(Src) 98.1 F (36.7 C) (Oral)  Wt 215 lb 9.6 oz (97.796 kg)  SpO2 97% General appearance: alert, cooperative, appears stated age and no distress Abdomen: soft, non-tender; bowel sounds normal; no masses,  no organomegaly Extremities: extremities normal, atraumatic, no cyanosis or edema Neurologic: Alert and oriented X 3, normal strength and tone. Normal symmetric reflexes. Normal coordination and gait  Laboratory:  Urine dipstick: not done.   Micro exam: not done.   Pt unable to leave sample Assessment:    urinary frequency    back pain--- don't believe its related to above,  Tylenol arthritis otc ---rto if no relief. Plan:    Maintain adequate hydration. Follow up if symptoms not improving, and as needed. pt unable to leave sample--pt was given a cup to collect at home and drop off

## 2011-05-16 NOTE — Telephone Encounter (Signed)
Discuss with patient  

## 2011-05-16 NOTE — Telephone Encounter (Signed)
She should discuss alternatives to this medicine with her pharmacist. The price is set by her insurance company; the pharmacist should know  the cheapest alternative on her plan. This is a generic , not branded calcium channel blocker. There may be another generic in same class her plan prefers

## 2011-05-16 NOTE — Patient Instructions (Signed)
Back Pain, Adult Low back pain is very common. About 1 in 5 people have back pain.The cause of low back pain is rarely dangerous. The pain often gets better over time.About half of people with a sudden onset of back pain feel better in just 2 weeks. About 8 in 10 people feel better by 6 weeks.  CAUSES Some common causes of back pain include:  Strain of the muscles or ligaments supporting the spine.   Wear and tear (degeneration) of the spinal discs.   Arthritis.   Direct injury to the back.  DIAGNOSIS Most of the time, the direct cause of low back pain is not known.However, back pain can be treated effectively even when the exact cause of the pain is unknown.Answering your caregiver's questions about your overall health and symptoms is one of the most accurate ways to make sure the cause of your pain is not dangerous. If your caregiver needs more information, he or she may order lab work or imaging tests (X-rays or MRIs).However, even if imaging tests show changes in your back, this usually does not require surgery. HOME CARE INSTRUCTIONS For many people, back pain returns.Since low back pain is rarely dangerous, it is often a condition that people can learn to manageon their own.   Remain active. It is stressful on the back to sit or stand in one place. Do not sit, drive, or stand in one place for more than 30 minutes at a time. Take short walks on level surfaces as soon as pain allows.Try to increase the length of time you walk each day.   Do not stay in bed.Resting more than 1 or 2 days can delay your recovery.   Do not avoid exercise or work.Your body is made to move.It is not dangerous to be active, even though your back may hurt.Your back will likely heal faster if you return to being active before your pain is gone.   Pay attention to your body when you bend and lift. Many people have less discomfortwhen lifting if they bend their knees, keep the load close to their  bodies,and avoid twisting. Often, the most comfortable positions are those that put less stress on your recovering back.   Find a comfortable position to sleep. Use a firm mattress and lie on your side with your knees slightly bent. If you lie on your back, put a pillow under your knees.   Only take over-the-counter or prescription medicines as directed by your caregiver. Over-the-counter medicines to reduce pain and inflammation are often the most helpful.Your caregiver may prescribe muscle relaxant drugs.These medicines help dull your pain so you can more quickly return to your normal activities and healthy exercise.   Put ice on the injured area.   Put ice in a plastic bag.   Place a towel between your skin and the bag.   Leave the ice on for 15 to 20 minutes, 3 to 4 times a day for the first 2 to 3 days. After that, ice and heat may be alternated to reduce pain and spasms.   Ask your caregiver about trying back exercises and gentle massage. This may be of some benefit.   Avoid feeling anxious or stressed.Stress increases muscle tension and can worsen back pain.It is important to recognize when you are anxious or stressed and learn ways to manage it.Exercise is a great option.  SEEK MEDICAL CARE IF:  You have pain that is not relieved with rest or medicine.   You have   pain that does not improve in 1 week.   You have new symptoms.   You are generally not feeling well.  SEEK IMMEDIATE MEDICAL CARE IF:   You have pain that radiates from your back into your legs.   You develop new bowel or bladder control problems.   You have unusual weakness or numbness in your arms or legs.   You develop nausea or vomiting.   You develop abdominal pain.   You feel faint.  Document Released: 02/25/2005 Document Revised: 02/14/2011 Document Reviewed: 07/16/2010 [employer] Patient Information 2012 [employer]. 

## 2011-05-17 LAB — URINALYSIS, ROUTINE W REFLEX MICROSCOPIC
Bilirubin Urine: NEGATIVE
Hgb urine dipstick: NEGATIVE
Ketones, ur: NEGATIVE
Nitrite: NEGATIVE
Specific Gravity, Urine: >=1.03 (ref 1.000–1.030)
Total Protein, Urine: NEGATIVE
Urine Glucose: NEGATIVE
Urobilinogen, UA: 0.2 (ref 0.0–1.0)
pH: 5.5 (ref 5.0–8.0)

## 2011-05-17 NOTE — Progress Notes (Signed)
Addended by: Silvio Pate D on: 05/17/2011 02:49 PM   Modules accepted: Orders

## 2011-06-14 ENCOUNTER — Ambulatory Visit (INDEPENDENT_AMBULATORY_CARE_PROVIDER_SITE_OTHER)
Admission: RE | Admit: 2011-06-14 | Discharge: 2011-06-14 | Disposition: A | Discharge status: Home / Self Care | Payer: Medicare Other | Source: Ambulatory Visit | Attending: Internal Medicine | Admitting: Internal Medicine

## 2011-06-14 ENCOUNTER — Ambulatory Visit (INDEPENDENT_AMBULATORY_CARE_PROVIDER_SITE_OTHER): Payer: Medicare Other | Admitting: Internal Medicine

## 2011-06-14 ENCOUNTER — Encounter: Payer: Self-pay | Admitting: Internal Medicine

## 2011-06-14 VITALS — BP 126/82 | HR 60 | Temp 97.4°F | Wt 218.8 lb

## 2011-06-14 DIAGNOSIS — IMO0001 Reserved for inherently not codable concepts without codable children: Secondary | ICD-10-CM

## 2011-06-14 DIAGNOSIS — R351 Nocturia: Secondary | ICD-10-CM

## 2011-06-14 DIAGNOSIS — M545 Low back pain: Secondary | ICD-10-CM

## 2011-06-14 DIAGNOSIS — R350 Frequency of micturition: Secondary | ICD-10-CM

## 2011-06-14 MED ORDER — TRAMADOL HCL 50 MG PO TABS: 50.0000 mg | ORAL_TABLET | Freq: Four times a day (QID) | ORAL | Status: AC | PRN

## 2011-06-14 NOTE — Patient Instructions (Signed)
The best exercises for the low back include freestyle swimming, stretch aerobics, and yoga.  Please try to go on My Chart within the next 24 hours to allow me to release the results directly to you.  

## 2011-06-14 NOTE — Progress Notes (Signed)
  Subjective:    Patient ID: Yvonne Bailey, female    DOB: 1941/01/29, 71 y.o.   MRN: [id number]  HPI She describes lumbosacral pain since the first week in March without any specific trigger or injury. She is unable to characterize the pain but it is definitely worse when she stands. It lasts only as long as she is standing. Aspirin really has not made a significant difference.   She denies any weakness, numbness, or tingling in her legs. She does describe frequency and nocturia.  She has no past history of back injury or back surgery.  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 Her peripheral vascular disease is followed by Dr. Clifton James.  Review of Systems She denies abdominal pain, unexplained weight loss, melena, or rectal bleeding.  Despite the frequency, she denies hematuria, pyuria or dysuria.  There've been no skin changes in this area. She has not had fever, chills, or sweats.     Objective:   Physical Exam Gen.: Healthy and well-nourished in appearance. Alert, appropriate and cooperative throughout exam.  Neck: No deformities, masses, or tenderness noted. Range of motion normal. Lungs: Normal respiratory effort; chest expands symmetrically. Lungs are clear to auscultation without rales, wheezes, or increased work of breathing. Heart: Normal rate and rhythm. Normal S1 and S2. No gallop, click, or rub. S4 w/o murmur. Abdomen: Bowel sounds normal; abdomen soft and nontender. No masses, organomegaly or hernias noted.Dullness RUQ; no AAA                                          Musculoskeletal/extremities: No deformity or scoliosis noted of  the thoracic or lumbar spine. No clubbing, cyanosis, edema, or deformity noted. Range of motion  normal .Tone & strength  normal.Joints normal. Nail health  good. She is able to lie flat and sit up without help. Straight leg raising is normal bilaterally. Gait is normal including tiptoe and heel  walking. Vascular: Carotid, radial artery, dorsalis pedis and  posterior tibial pulses are palpable but slight decrease in  pedal pulses. R carotid bruit present. Neurologic: Alert and oriented x3. Deep tendon reflexes symmetrical and normal.          Skin: Intact without suspicious lesions or rashes. Lymph: No cervical, axillary lymphadenopathy present. Psych: Mood and affect are normal. Normally interactive                                                                                         Assessment & Plan:  #1 lumbosacral pain with no neuromuscular deficit on exam. It is possible this represents spinal stenosis and she only has symptoms when she stands. Because of the duration imaging will be completed.  #2 frequency and nocturia; urinalysis is negative  Plan: See orders and recommendations.

## 2011-06-26 ENCOUNTER — Other Ambulatory Visit: Payer: Self-pay | Admitting: Family Medicine

## 2011-06-27 ENCOUNTER — Other Ambulatory Visit: Payer: Self-pay | Admitting: *Deleted

## 2011-06-27 MED ORDER — METOPROLOL SUCCINATE ER 25 MG PO TB24: ORAL_TABLET | ORAL | Status: DC

## 2011-09-10 ENCOUNTER — Encounter (INDEPENDENT_AMBULATORY_CARE_PROVIDER_SITE_OTHER): Payer: Medicare Other

## 2011-09-10 DIAGNOSIS — I2510 Atherosclerotic heart disease of native coronary artery without angina pectoris: Secondary | ICD-10-CM

## 2011-09-10 DIAGNOSIS — I6529 Occlusion and stenosis of unspecified carotid artery: Secondary | ICD-10-CM

## 2011-09-19 ENCOUNTER — Encounter (INDEPENDENT_AMBULATORY_CARE_PROVIDER_SITE_OTHER): Payer: Medicare Other

## 2011-09-19 DIAGNOSIS — I739 Peripheral vascular disease, unspecified: Secondary | ICD-10-CM

## 2011-09-19 DIAGNOSIS — I2510 Atherosclerotic heart disease of native coronary artery without angina pectoris: Secondary | ICD-10-CM

## 2011-09-24 ENCOUNTER — Other Ambulatory Visit: Payer: Self-pay | Admitting: *Deleted

## 2011-09-24 MED ORDER — ROSUVASTATIN CALCIUM 20 MG PO TABS: 20.0000 mg | ORAL_TABLET | Freq: Every day | ORAL | Status: DC

## 2012-01-08 ENCOUNTER — Encounter: Payer: Self-pay | Admitting: Internal Medicine

## 2012-01-08 ENCOUNTER — Ambulatory Visit (INDEPENDENT_AMBULATORY_CARE_PROVIDER_SITE_OTHER): Payer: Medicare Other | Admitting: Internal Medicine

## 2012-01-08 VITALS — BP 130/88 | HR 59 | Temp 98.7°F | Wt 220.8 lb

## 2012-01-08 DIAGNOSIS — N390 Urinary tract infection, site not specified: Secondary | ICD-10-CM

## 2012-01-08 DIAGNOSIS — R350 Frequency of micturition: Secondary | ICD-10-CM

## 2012-01-08 DIAGNOSIS — R739 Hyperglycemia, unspecified: Secondary | ICD-10-CM

## 2012-01-08 DIAGNOSIS — R351 Nocturia: Secondary | ICD-10-CM

## 2012-01-08 DIAGNOSIS — Z833 Family history of diabetes mellitus: Secondary | ICD-10-CM

## 2012-01-08 DIAGNOSIS — R7309 Other abnormal glucose: Secondary | ICD-10-CM

## 2012-01-08 LAB — POCT URINALYSIS DIPSTICK
Bilirubin, UA: NEGATIVE
Blood, UA: NEGATIVE
Glucose, UA: NEGATIVE
Ketones, UA: NEGATIVE
Nitrite, UA: NEGATIVE
Protein, UA: NEGATIVE
Spec Grav, UA: 1.015
Urobilinogen, UA: 0.2
pH, UA: 6

## 2012-01-08 NOTE — Patient Instructions (Addendum)
Decrease HCTZ from 25 mg  to one half pill daily (12.5 mg). Monitor blood pressure on this dose.Blood Pressure Goal  Ideally is an AVERAGE < 135/85. This AVERAGE should be calculated from @ least 5-7 BP readings taken @ different times of day on different days of week. You should not respond to isolated BP readings , but rather the AVERAGE for that week. Drink as much nondairy fluids as possible. Avoid spicy foods ,alcohol , decongestants, & narcotics if possible.   If you activate My Chart; the results can be released to you as soon as they populate from the lab. If you choose not to use this program; the labs have to be reviewed, copied & mailed   causing a delay in getting the results to you.

## 2012-01-08 NOTE — Progress Notes (Signed)
  Subjective:    Patient ID: Yvonne Bailey, female    DOB: Dec 25, 1940, 72 y.o.   MRN: [id number]  HPI In August she began nocturia up to 4 or  more times at night. By September she began to have daytime frequency as well. She denies associated dysuria, hematuria, or pyuria.  Review of the chart reveals that she's had hyperglycemia in the past with glucose up to 105. Her mother had type 2 diabetes. She is on HCTZ 25 mg daily.    Review of Systems She denies fever, chills, or sweats. She has taken tramadol for back pain; she does not describe distinct flank pain. She also denies polydipsia or polyphasia.     Objective:   Physical Exam General appearance is one of good health and nourishment w/o distress.  Eyes: No conjunctival inflammation or scleral icterus is present.  Oral exam: upper plate ; lips and gums are healthy appearing.There is no oropharyngeal erythema or exudate noted.   Heart:  Normal rate and regular rhythm. S1 and S2 normal without gallop, murmur, click, rub or other extra sounds     Lungs:Chest clear to auscultation; no wheezes, rhonchi,rales ,or rubs present.No increased work of breathing.   Abdomen: bowel sounds normal, soft and non-tender without masses, organomegaly . Epigastric hernia noted.  No guarding or rebound ;no flank tenderness  Skin:Warm & dry.  Intact without suspicious lesions or rashes ;  tenting  Lymphatic: No lymphadenopathy is noted about the head, neck, axilla  Musculoskeletal: Negative straight leg raising bilaterally  Neuro: Deep tendon reflexes, strength and tone normal            Assessment & Plan:  #1 change in urination, nocturia and frequency  #2 2+ leukocytes on urinalysis  #3 mild hyperglycemia; family history diabetes  Plan: See orders & recommendations

## 2012-01-09 LAB — BASIC METABOLIC PANEL
BUN: 18 mg/dL (ref 6–23)
CO2: 28 mEq/L (ref 19–32)
Calcium: 8.8 mg/dL (ref 8.4–10.5)
Chloride: 106 mEq/L (ref 96–112)
Creatinine, Ser: 1.2 mg/dL (ref 0.4–1.2)
GFR: 59.14 mL/min — ABNORMAL LOW (ref >60.00)
Glucose, Bld: 86 mg/dL (ref 70–99)
Potassium: 4.1 mEq/L (ref 3.5–5.1)
Sodium: 140 mEq/L (ref 135–145)

## 2012-01-09 LAB — HEMOGLOBIN A1C: Hgb A1c MFr Bld: 5.3 % (ref 4.6–6.5)

## 2012-01-11 LAB — URINE CULTURE: Colony Count: >=100000

## 2012-01-16 ENCOUNTER — Telehealth: Payer: Self-pay

## 2012-01-16 NOTE — Telephone Encounter (Signed)
Pt called left message on triage line stating wants to change Rx because of all the side effects she has read about. Pt didin't leave name of med.  I called pt back husband advised she had left the house would be back in an hour. Husband stated he would tell her to call us back. Icalled pt cell but no answer. Awaiting pt call.    MW

## 2012-01-16 NOTE — Telephone Encounter (Signed)
Pt called back and advise me medication name is Bactrium. Pt just started med yesterday because was nervous about side effects. Pt is requesting based off labs what is the infection to cause her to need Bactrium?  I am not clear myself  Plz advise     (Plz see previous note for more clarity)                 MW

## 2012-01-16 NOTE — Telephone Encounter (Signed)
A significant urinary tract infection was sensitive to the sulfa agent. A prescription can be changed to ciprofloxacin 500  mg twice a day dispense 14. Has she actually had any adverse reaction to the sulfa?

## 2012-01-16 NOTE — Telephone Encounter (Signed)
Advise pt of what you said. Pt stated understanding. Pt states hasn't had any adverse reactions and will keep taking medication.  MW

## 2012-02-13 ENCOUNTER — Other Ambulatory Visit: Payer: Self-pay | Admitting: Internal Medicine

## 2012-02-13 DIAGNOSIS — Z1231 Encounter for screening mammogram for malignant neoplasm of breast: Secondary | ICD-10-CM

## 2012-03-03 ENCOUNTER — Other Ambulatory Visit: Payer: Self-pay | Admitting: Family Medicine

## 2012-03-05 NOTE — Telephone Encounter (Signed)
Refill done.  

## 2012-03-23 ENCOUNTER — Ambulatory Visit
Admission: RE | Admit: 2012-03-23 | Discharge: 2012-03-23 | Disposition: A | Discharge status: Home / Self Care | Payer: Medicare Other | Source: Ambulatory Visit | Attending: Internal Medicine | Admitting: Internal Medicine

## 2012-03-23 DIAGNOSIS — Z1231 Encounter for screening mammogram for malignant neoplasm of breast: Secondary | ICD-10-CM

## 2012-04-25 ENCOUNTER — Other Ambulatory Visit: Payer: Self-pay

## 2012-07-09 ENCOUNTER — Ambulatory Visit (INDEPENDENT_AMBULATORY_CARE_PROVIDER_SITE_OTHER): Payer: Medicare Other | Admitting: Family Medicine

## 2012-07-09 ENCOUNTER — Encounter: Payer: Self-pay | Admitting: Family Medicine

## 2012-07-09 VITALS — BP 122/80 | HR 71 | Temp 98.5°F | Wt 218.0 lb

## 2012-07-09 DIAGNOSIS — R29898 Other symptoms and signs involving the musculoskeletal system: Secondary | ICD-10-CM

## 2012-07-09 DIAGNOSIS — R350 Frequency of micturition: Secondary | ICD-10-CM | POA: Insufficient documentation

## 2012-07-09 LAB — POCT URINALYSIS DIPSTICK
Bilirubin, UA: NEGATIVE
Glucose, UA: NEGATIVE
Ketones, UA: NEGATIVE
Nitrite, UA: NEGATIVE
Spec Grav, UA: 1.025
Urobilinogen, UA: 0.2
pH, UA: 6

## 2012-07-09 NOTE — Patient Instructions (Addendum)
Weakness Weakness is a lack of strength. It may be felt all over the body (generalized) or in one specific part of the body (focal). Some causes of weakness can be serious. You may need further medical evaluation, especially if you are elderly or you have a history of immunosuppression (such as chemotherapy or HIV), kidney disease, heart disease, or diabetes. CAUSES  Weakness can be caused by many different things, including:  Infection.  Physical exhaustion.  Internal bleeding or other blood loss that results in a lack of red blood cells (anemia).  Dehydration. This cause is more common in elderly people.  Side effects or electrolyte abnormalities from medicines, such as pain medicines or sedatives.  Emotional distress, anxiety, or depression.  Circulation problems, especially severe peripheral arterial disease.  Heart disease, such as rapid atrial fibrillation, bradycardia, or heart failure.  Nervous system disorders, such as Guillain-Barr syndrome, multiple sclerosis, or stroke. DIAGNOSIS  To find the cause of your weakness, your caregiver will take your history and perform a physical exam. Lab tests or X-rays may also be ordered, if needed. TREATMENT  Treatment of weakness depends on the cause of your symptoms and can vary greatly. HOME CARE INSTRUCTIONS   Rest as needed.  Eat a well-balanced diet.  Try to get some exercise every day.  Only take over-the-counter or prescription medicines as directed by your caregiver. SEEK MEDICAL CARE IF:   Your weakness seems to be getting worse or spreads to other parts of your body.  You develop new aches or pains. SEEK IMMEDIATE MEDICAL CARE IF:   You cannot perform your normal daily activities, such as getting dressed and feeding yourself.  You cannot walk up and down stairs, or you feel exhausted when you do so.  You have shortness of breath or chest pain.  You have difficulty moving parts of your body.  You have weakness  in only one area of the body or on only one side of the body.  You have a fever.  You have trouble speaking or swallowing.  You cannot control your bladder or bowel movements.  You have black or bloody vomit or stools. MAKE SURE YOU:  Understand these instructions.  Will watch your condition.  Will get help right away if you are not doing well or get worse. Document Released: 02/25/2005 Document Revised: 08/27/2011 Document Reviewed: 04/26/2011 Exit[employer] Patient Information 2013 [employer].  

## 2012-07-09 NOTE — Assessment & Plan Note (Signed)
Refer to neuro And PT ? etiology

## 2012-07-09 NOTE — Progress Notes (Signed)
  Subjective:    Patient ID: Yvonne Bailey, female    DOB: [birth year], 72 y.o.   MRN: [id number]  HPI Pt here c/o lower ext weakness for about 1 month.  She is also c/o frequent urination and some incontinence issues.  No dysuria, no abd pain No slurred speech, no vision changes, no known injury   Review of Systems As above    Objective:   Physical Exam  BP 122/80  Pulse 71  Temp(Src) 98.5 F (36.9 C) (Oral)  Wt 218 lb (98.884 kg)  BMI 35.2 kg/m2  SpO2 98% General appearance: alert, cooperative, appears stated age and no distress Abdomen: soft, non-tender; bowel sounds normal; no masses,  no organomegaly Extremities: extremities normal, atraumatic, no cyanosis or edema Neurologic: Motor: weakness in low ext b/l --- R>L-- she is walking with a cane and that is new for       Assessment & Plan:

## 2012-07-09 NOTE — Assessment & Plan Note (Signed)
Check culture Hold off on treatment until culture back unless more symptoms develop Consider detrol or vesicare

## 2012-07-13 LAB — URINE CULTURE: Colony Count: 30000

## 2012-07-14 ENCOUNTER — Other Ambulatory Visit: Payer: Self-pay

## 2012-07-14 MED ORDER — METOPROLOL SUCCINATE ER 25 MG PO TB24: ORAL_TABLET | ORAL | Status: DC

## 2012-07-16 ENCOUNTER — Encounter: Payer: Self-pay | Admitting: Family Medicine

## 2012-07-16 ENCOUNTER — Ambulatory Visit: Payer: Medicare Other | Attending: Family Medicine | Admitting: Physical Therapy

## 2012-07-16 DIAGNOSIS — IMO0001 Reserved for inherently not codable concepts without codable children: Secondary | ICD-10-CM | POA: Insufficient documentation

## 2012-07-16 DIAGNOSIS — R269 Unspecified abnormalities of gait and mobility: Secondary | ICD-10-CM | POA: Insufficient documentation

## 2012-07-16 DIAGNOSIS — M6281 Muscle weakness (generalized): Secondary | ICD-10-CM | POA: Insufficient documentation

## 2012-07-21 ENCOUNTER — Ambulatory Visit: Payer: Medicare Other

## 2012-07-23 ENCOUNTER — Ambulatory Visit: Payer: Medicare Other

## 2012-07-28 ENCOUNTER — Ambulatory Visit: Payer: Medicare Other | Admitting: Physical Therapy

## 2012-07-30 ENCOUNTER — Ambulatory Visit: Payer: Medicare Other | Admitting: Physical Therapy

## 2012-07-31 ENCOUNTER — Ambulatory Visit: Payer: Medicare Other | Admitting: Physical Therapy

## 2012-08-05 ENCOUNTER — Other Ambulatory Visit: Payer: Self-pay | Admitting: Internal Medicine

## 2012-08-05 ENCOUNTER — Ambulatory Visit: Payer: Medicare Other | Admitting: Physical Therapy

## 2012-08-07 ENCOUNTER — Ambulatory Visit: Payer: Medicare Other | Admitting: Physical Therapy

## 2012-08-11 ENCOUNTER — Ambulatory Visit: Payer: Medicare Other | Admitting: Physical Therapy

## 2012-08-13 ENCOUNTER — Ambulatory Visit: Payer: Medicare Other | Admitting: Physical Therapy

## 2012-08-14 ENCOUNTER — Ambulatory Visit (INDEPENDENT_AMBULATORY_CARE_PROVIDER_SITE_OTHER): Payer: Medicare Other | Admitting: Neurology

## 2012-08-14 ENCOUNTER — Encounter: Payer: Self-pay | Admitting: Neurology

## 2012-08-14 ENCOUNTER — Other Ambulatory Visit: Payer: Self-pay | Admitting: Cardiovascular Disease

## 2012-08-14 ENCOUNTER — Telehealth: Payer: Self-pay | Admitting: Internal Medicine

## 2012-08-14 VITALS — BP 117/69 | HR 58 | Temp 99.0°F | Ht 66.5 in | Wt 222.0 lb

## 2012-08-14 DIAGNOSIS — I10 Essential (primary) hypertension: Secondary | ICD-10-CM

## 2012-08-14 DIAGNOSIS — I2510 Atherosclerotic heart disease of native coronary artery without angina pectoris: Secondary | ICD-10-CM

## 2012-08-14 DIAGNOSIS — E785 Hyperlipidemia, unspecified: Secondary | ICD-10-CM

## 2012-08-14 DIAGNOSIS — E039 Hypothyroidism, unspecified: Secondary | ICD-10-CM

## 2012-08-14 DIAGNOSIS — R29898 Other symptoms and signs involving the musculoskeletal system: Secondary | ICD-10-CM

## 2012-08-14 NOTE — Telephone Encounter (Signed)
Rx for plavix called to [facility] in [place]

## 2012-08-14 NOTE — Progress Notes (Signed)
History of present illness:  Yvonne Bailey is a 72 yo RH AAF referred by her primary care Dr. Alwyn Bailey for evaluation of right side weakness.  She has past medical history of hypertension, hyperlipidemia, coronary artery disease, previous stroke, presented with acute onset left hand weakness, she had a history of angioplasty, is taking aspirin, plus Plavix,  At the end of April 2014, while watching TV one evening, she said and he felt right arm pain, above elbow, when she leaning towards the floor to pick up object, she leaning towards the right side, she noticed right arm, and the leg mild weakness, or weakness persistent,  She was not able to get out of her bathtub the next day, was evaluated by her primary care physician may first 2014, referred to physical therapy, her right arm, and the the leg weakness lasting for 3 weeks, improved by physical therapy, got back to her baseline yet,  [place] Cartoid July 2013, 1-39% stenosis bilateral  Internal carotid artery   Review of Systems  Out of a complete 14 system review, the patient complains of only the following symptoms, and all other reviewed systems are negative.   Constitutional:   N/A Cardiovascular:  N/A Ear/Nose/Throat:  N/A Skin: N/A Eyes: N/A Respiratory: N/A Gastroitestinal: N/A    Hematology/Lymphatic:  N/A Endocrine:  N/A Musculoskeletal:N/A Allergy/Immunology: N/A Neurological: right arm and leg weakness Psychiatric:    N/A  PHYSICAL EXAMINATOINS:  Generalized: In no acute distress  Neck: Supple, no carotid bruits   Cardiac: Regular rate rhythm  Pulmonary: Clear to auscultation bilaterally  Musculoskeletal: No deformity  Neurological examination  Mentation: Alert oriented to time, place, history taking, and causual conversation  Cranial nerve II-XII: Pupils were equal round reactive to light extraocular movements were full, visual field were full on confrontational test. facial sensation and strength were normal.  hearing was intact to finger rubbing bilaterally. Uvula tongue midline.  head turning and shoulder shrug and were normal and symmetric.Tongue protrusion into cheek strength was normal.  Motor: she has mild right arm weakness 5-/5, (proximal +distal), mild right hip flexion weakness 5-.  Sensory: Intact to fine touch, pinprick, preserved vibratory sensation, and proprioception at toes.  Coordination: Normal finger to nose, heel-to-shin bilaterally there was no truncal ataxia  Gait: Rising up from seated position without assistance, normal stance, without trunk ataxia, moderate stride, good arm swing, smooth turning, able to perform tiptoe, and heel walking without difficulty.   Romberg signs: Negative  Deep tendon reflexes: Brachioradialis 2/2, biceps 2/2, triceps 2/2, patellar 2/2, Achilles 2/2, plantar responses were flexor bilaterally.  Assessment and plan: 72 years old right-handed [place] American female, with vascular risk factors of hypertension, hyperlipidemia, coronary artery disease, previous stroke, presenting with acute onset right arm and leg weakness,  1, most consistent with a left subcortical,/internal capsule small vessel acute stroke  2, continue aspirin and Plavix,  3, continue moderate exercise,  4. complete evaluation with MRI of the brain, MRA of the brain, ultrasound of carotid artery, echocardiogram,  5, return to clinic with Yvonne Bailey in one month

## 2012-08-14 NOTE — Telephone Encounter (Signed)
clopidogrel (PLAVIX) 75 MG tablet Mother has had mini stoke and son is very worried that mom needs to be back on med.  Mother is very emotional Dois Davenport can you look into this and help her today?

## 2012-08-17 NOTE — Telephone Encounter (Signed)
..   Requested Prescriptions   Pending Prescriptions Disp Refills  . clopidogrel (PLAVIX) 75 MG tablet [Pharmacy Med Name: CLOPIDOGREL 75MG     TAB] 30 tablet 1    Sig: TAKE ONE TABLET BY MOUTH EVERY DAY

## 2012-08-18 ENCOUNTER — Other Ambulatory Visit: Payer: Self-pay | Admitting: *Deleted

## 2012-08-27 ENCOUNTER — Ambulatory Visit (INDEPENDENT_AMBULATORY_CARE_PROVIDER_SITE_OTHER): Payer: Medicare Other

## 2012-08-27 DIAGNOSIS — R0989 Other specified symptoms and signs involving the circulatory and respiratory systems: Secondary | ICD-10-CM

## 2012-08-27 DIAGNOSIS — E785 Hyperlipidemia, unspecified: Secondary | ICD-10-CM

## 2012-08-27 DIAGNOSIS — E039 Hypothyroidism, unspecified: Secondary | ICD-10-CM

## 2012-08-27 DIAGNOSIS — I10 Essential (primary) hypertension: Secondary | ICD-10-CM

## 2012-08-27 DIAGNOSIS — R29898 Other symptoms and signs involving the musculoskeletal system: Secondary | ICD-10-CM

## 2012-08-27 DIAGNOSIS — I2510 Atherosclerotic heart disease of native coronary artery without angina pectoris: Secondary | ICD-10-CM

## 2012-09-02 ENCOUNTER — Ambulatory Visit
Admission: RE | Admit: 2012-09-02 | Discharge: 2012-09-02 | Disposition: A | Discharge status: Home / Self Care | Payer: Medicare Other | Source: Ambulatory Visit | Attending: Neurology | Admitting: Neurology

## 2012-09-02 ENCOUNTER — Other Ambulatory Visit: Payer: Medicare Other

## 2012-09-02 DIAGNOSIS — I10 Essential (primary) hypertension: Secondary | ICD-10-CM

## 2012-09-02 DIAGNOSIS — R29898 Other symptoms and signs involving the musculoskeletal system: Secondary | ICD-10-CM

## 2012-09-02 DIAGNOSIS — E785 Hyperlipidemia, unspecified: Secondary | ICD-10-CM

## 2012-09-02 DIAGNOSIS — E039 Hypothyroidism, unspecified: Secondary | ICD-10-CM

## 2012-09-02 DIAGNOSIS — I2510 Atherosclerotic heart disease of native coronary artery without angina pectoris: Secondary | ICD-10-CM

## 2012-09-04 ENCOUNTER — Telehealth: Payer: Self-pay | Admitting: Neurology

## 2012-09-04 NOTE — Telephone Encounter (Signed)
I fail to reach patient, could not leave a message either.  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  2. Multiple chronic ischemic infarctions scattered throughout the bilateral cerebellar hemispheres as well as right frontal region. 3. Small chronic lacunar ischemic infarct in the left external capsule. 4. 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  5. 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  Will discuss above findings on follow up in sept

## 2012-09-18 ENCOUNTER — Telehealth: Payer: Self-pay | Admitting: *Deleted

## 2012-11-16 ENCOUNTER — Telehealth: Payer: Self-pay | Admitting: *Deleted

## 2012-11-16 NOTE — Telephone Encounter (Signed)
Called patient and left message about appointment on 11-17-12. Patient needs to call office and confirm.

## 2012-11-17 ENCOUNTER — Ambulatory Visit: Payer: Medicare Other | Admitting: Nurse Practitioner

## 2012-12-17 ENCOUNTER — Other Ambulatory Visit: Payer: Self-pay | Admitting: Cardiovascular Disease

## 2012-12-17 ENCOUNTER — Ambulatory Visit (INDEPENDENT_AMBULATORY_CARE_PROVIDER_SITE_OTHER): Payer: Medicare Other | Admitting: Nurse Practitioner

## 2012-12-17 ENCOUNTER — Encounter: Payer: Self-pay | Admitting: Nurse Practitioner

## 2012-12-17 VITALS — BP 145/83 | HR 56 | Ht 67.0 in | Wt 223.0 lb

## 2012-12-17 DIAGNOSIS — I10 Essential (primary) hypertension: Secondary | ICD-10-CM

## 2012-12-17 DIAGNOSIS — I6381 Other cerebral infarction due to occlusion or stenosis of small artery: Secondary | ICD-10-CM | POA: Insufficient documentation

## 2012-12-17 DIAGNOSIS — I6350 Cerebral infarction due to unspecified occlusion or stenosis of unspecified cerebral artery: Secondary | ICD-10-CM

## 2012-12-17 DIAGNOSIS — I679 Cerebrovascular disease, unspecified: Secondary | ICD-10-CM | POA: Insufficient documentation

## 2012-12-17 DIAGNOSIS — E785 Hyperlipidemia, unspecified: Secondary | ICD-10-CM

## 2012-12-17 DIAGNOSIS — R29898 Other symptoms and signs involving the musculoskeletal system: Secondary | ICD-10-CM

## 2012-12-17 HISTORY — DX: Other cerebral infarction due to occlusion or stenosis of small artery: I63.81

## 2012-12-17 NOTE — Progress Notes (Signed)
[facility]  PATIENT: Yvonne Bailey DOB: [birth year]   REASON FOR VISIT: Followup right-sided weakness   HISTORY OF PRESENT ILLNESS:Yvonne Bailey is a 72 yo RH AAF referred by her primary care Dr. Alwyn Ren for evaluation of right side weakness initially evaluated by Dr. Terrace Arabia 08/14/2012. 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  Multiple chronic ischemic infarctions scattered throughout the bilateral cerebellar hemispheres as well as right frontal region. Small chronic lacunar ischemic infarct in the left external capsule. 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 Carotid Doppler negative for significant stenosis involving extracranial carotid arteries bilaterally. 2-D echo was never scheduled. She remains on Plavix and aspirin. She has not had recurrent symptoms and her weakness is improved with physical therapy.   She has past medical history of hypertension, hyperlipidemia, coronary artery disease, previous stroke, presented with acute onset left hand weakness, she had a history of angioplasty, is taking aspirin, plus Plavix, At the end of April 2014, while watching TV one evening, she said and he felt right arm pain, above elbow, when she leaning towards the floor to pick up object, she leaning towards the right side, she noticed right arm, and the leg mild weakness, or weakness persistent, She was not able to get out of her bathtub the next day, was evaluated by her primary care physician may first 2014, referred to physical therapy, her right arm, and the the leg weakness lasting for 3 weeks, improved by physical therapy, got back to her baseline yet, [place] Cartoid July 2013, 1-39% stenosis bilateral  Internal carotid artery       REVIEW OF SYSTEMS: Full  14 system review of systems performed and notable only for:  Constitutional: N/A  Cardiovascular: N/A  Ear/Nose/Throat: N/A  Skin: N/A  Eyes: N/A  Respiratory: N/A  Gastroitestinal: N/A  Hematology/Lymphatic: N/A  Endocrine: N/A Musculoskeletal:N/A  Allergy/Immunology: N/A  Neurological: N/A Psychiatric: N/A   ALLERGIES: Allergies  Allergen Reactions  . Cilostazol      Pletal :" head felt funny"    HOME MEDICATIONS: Outpatient Prescriptions Prior to Visit  Medication Sig Dispense Refill  . amLODipine (NORVASC) 5 MG tablet TAKE 1 TABLET BY MOUTH DAILY  30 tablet  3  . Ascorbic Acid (VITAMIN C) 100 MG tablet Take 100 mg by mouth daily.        Marland Kitchen aspirin 81 MG tablet Take 81 mg by mouth daily.      . benazepril (LOTENSIN) 20 MG tablet TAKE 1 TABLET BY MOUTH EVERY DAY  30 tablet  3  . clopidogrel (PLAVIX) 75 MG tablet TAKE ONE TABLET BY MOUTH EVERY DAY  30 tablet  1  . fish oil-omega-3 fatty acids 1000 MG capsule Take 2 g by mouth daily.        . hydrochlorothiazide 25 MG tablet Take 1 tablet (25 mg total) by mouth daily. Take with OJ  30 tablet  11  . levothyroxine (SYNTHROID, LEVOTHROID) 25 MCG tablet Take 1 tablet (25 mcg total) by mouth daily.  30 tablet  11  . metoprolol succinate (TOPROL-XL) 25 MG 24 hr tablet Take 1/2 tablet po qd  15 tablet  6  . Multiple Vitamin (MULTIVITAMIN) tablet Take 1 tablet by mouth daily.        . TRAVATAN Z 0.004 % SOLN ophthalmic  solution       . vitamin E 100 UNIT capsule Take 100 Units by mouth daily.        . rosuvastatin (CRESTOR) 20 MG tablet Take 1 tablet (20 mg total) by mouth at bedtime.  30 tablet  11   No facility-administered medications prior to visit.    PAST MEDICAL HISTORY: Past Medical History  Diagnosis Date  . MI (myocardial infarction) 15    at age 56; TIA treated with coumadin until Plavix initiated in 2006  . Hyperlipidemia   . HTN (hypertension)   . Hypothyroidism   . DVT (deep venous thrombosis)     X1  . CAD  (coronary artery disease)     S/P  anterior  wall myocardial infarction in '92, treated w/percutaneous transluminal coronary angioplasty of the left anterior descending. EF 45%  . PAD (peripheral artery disease)     Right SFA occlusion, severe disease left CFA and SFA  . Carotid artery disease     PAST SURGICAL HISTORY: Past Surgical History  Procedure Laterality Date  . Fracture lle      '94; pinned  . Appendectomy      at hysterectomy and USO for fibroids, Dr. Kizzie Bane  . Tonsillectomy    . Colonoscopy      negative; 2009, Dr. Lina Sar  . Cardiac catheterization  1992    Dr Charlies Constable  . Total abdominal hysterectomy      & BSO for fibroids    FAMILY HISTORY: Family History  Problem Relation Age of Onset  . Diabetes Mother   . Hypertension Mother   . Stroke Brother     HTN    SOCIAL HISTORY: History   Social History  . Marital Status: Married    Spouse Name: Fayrene Fearing    Number of Children: 1  . Years of Education: college   Occupational History  . Teacher     Retired   Social History Main Topics  . Smoking status: Former Smoker    Quit date: 03/11/1990  . Smokeless tobacco: Never Used     Comment: quit in 1992  . Alcohol Use: No  . Drug Use: No  . Sexual Activity: Not on file   Other Topics Concern  . 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 08/31/09- designated party form signed appointing, husband Sherrol Vicars; ok to leave msg on home phone [phone].      Caffeine Small Amount one cup very rare.   Right handed.   One child- Yvonne Bailey     PHYSICAL EXAM  Filed Vitals:   12/17/12 1034  BP: 145/83  Pulse: 56  Height: 5\' 7"  (1.702 m)  Weight: 223 lb (101.152 kg)   Body mass index is 34.92 kg/(m^2).  Generalized: Well developed, in no acute distress  Head: normocephalic and atraumatic,. Oropharynx benign  Neck: Supple, no carotid bruits  Cardiac: Regular rate  rhythm,  Musculoskeletal: No deformity   Neurological examination   Mentation: Alert oriented to time, place, history taking. Follows all commands speech and language fluent  Cranial nerve II-XII: Pupils were equal round reactive to light extraocular movements were full, visual field were full on confrontational test. Facial sensation and strength were normal. hearing was intact to finger rubbing bilaterally. Uvula tongue midline. head turning and shoulder shrug and were normal and symmetric.Tongue protrusion into cheek strength was normal. Motor: normal bulk and tone, full strength in  the BUE, BLE, fine finger movements normal, no pronator drift. No focal weakness Sensory: normal and symmetric to light touch, pinprick, and  vibration  Coordination: finger-nose-finger, heel-to-shin bilaterally, no dysmetria Reflexes: Brachioradialis 2/2, biceps 2/2, triceps 2/2, patellar 2/2, Achilles 2/2, plantar responses were flexor bilaterally. Gait and Station: Rising up from seated position without assistance, normal stance, without trunk ataxia, moderate stride, good arm swing, smooth turning, able to perform tiptoe, and heel walking without difficulty. Tandem gait stable  DIAGNOSTIC DATA (LABS, IMAGING, TESTING) -None to review ASSESSMENT AND PLAN  72 y.o. year old female  with vascular risk factors of hypertension, hyperlipidemia coronary artery disease, previous stroke  He presented with acute onset right arm and leg weakness. 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  Multiple chronic ischemic infarctions scattered throughout the bilateral cerebellar hemispheres as well as right frontal region. Small chronic lacunar ischemic infarct in the left external capsule. 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  Left vertebral artery not clearly visualized, may be  occluded. Carotid Doppler negative for significant stenosis involving extracranial carotid arteries bilaterally.Discussed with Dr. Terrace Arabia.  Need to reschedule 2D echo, it was never scheduled.  F/U in 3 months Continue Plavix and ASA Nilda Riggs, [facility], [facility], APRN  [facility] [address] [place], [place] [address] [phone]

## 2012-12-17 NOTE — Patient Instructions (Signed)
Need to get 2 D echo F/U in 3 months Continue Plavix and ASA

## 2012-12-28 ENCOUNTER — Other Ambulatory Visit (HOSPITAL_COMMUNITY): Payer: Self-pay | Admitting: Neurology

## 2012-12-28 ENCOUNTER — Ambulatory Visit (HOSPITAL_COMMUNITY): Payer: Medicare Other | Attending: Cardiology | Admitting: Cardiology

## 2012-12-28 DIAGNOSIS — R29898 Other symptoms and signs involving the musculoskeletal system: Secondary | ICD-10-CM

## 2012-12-28 DIAGNOSIS — I2510 Atherosclerotic heart disease of native coronary artery without angina pectoris: Secondary | ICD-10-CM | POA: Insufficient documentation

## 2012-12-28 DIAGNOSIS — I252 Old myocardial infarction: Secondary | ICD-10-CM | POA: Insufficient documentation

## 2012-12-28 DIAGNOSIS — I6350 Cerebral infarction due to unspecified occlusion or stenosis of unspecified cerebral artery: Secondary | ICD-10-CM

## 2012-12-28 DIAGNOSIS — I6381 Other cerebral infarction due to occlusion or stenosis of small artery: Secondary | ICD-10-CM

## 2012-12-28 DIAGNOSIS — I059 Rheumatic mitral valve disease, unspecified: Secondary | ICD-10-CM | POA: Insufficient documentation

## 2012-12-28 DIAGNOSIS — E785 Hyperlipidemia, unspecified: Secondary | ICD-10-CM

## 2012-12-28 DIAGNOSIS — I10 Essential (primary) hypertension: Secondary | ICD-10-CM | POA: Insufficient documentation

## 2012-12-28 DIAGNOSIS — E039 Hypothyroidism, unspecified: Secondary | ICD-10-CM

## 2012-12-28 NOTE — Progress Notes (Signed)
Echo performed. 

## 2012-12-29 ENCOUNTER — Telehealth: Payer: Self-pay | Admitting: Nurse Practitioner

## 2012-12-29 NOTE — Telephone Encounter (Signed)
Per Dr. Terrace Arabia Her EF is only 35-40%, she will need a cardiologist refer, if she has never seen by one. Please call the patient and asks who she sees or we can pick one.

## 2012-12-30 NOTE — Telephone Encounter (Signed)
Called all numbers for patient listed x3 . 12-30-2012 about Cardiologist. No answer I will try to call back 01-01-2014.

## 2012-12-31 NOTE — Telephone Encounter (Signed)
Called again. No answer

## 2013-01-01 NOTE — Telephone Encounter (Signed)
Called patient again. Voice mail was full.

## 2013-01-04 NOTE — Telephone Encounter (Signed)
Can we send the copy of ECHO and let patient know we have been trying to get in touch with her. She needs to see cardiologist.

## 2013-01-06 NOTE — Telephone Encounter (Signed)
Called patient and left message will send send her copy of echo and telling her she needs to see cardiologist. Also will tell patient if needs our help we will be glad to help her.

## 2013-02-02 ENCOUNTER — Other Ambulatory Visit: Payer: Self-pay | Admitting: Cardiovascular Disease

## 2013-04-23 ENCOUNTER — Other Ambulatory Visit: Payer: Self-pay | Admitting: Internal Medicine

## 2013-04-23 ENCOUNTER — Other Ambulatory Visit: Payer: Self-pay | Admitting: Cardiovascular Disease

## 2013-04-26 NOTE — Telephone Encounter (Signed)
Rx sent to the pharmacy by e-script.  Pt needs office visit for BP follow-up.//AB/CMA 

## 2013-05-07 NOTE — Telephone Encounter (Signed)
Patient was called back

## 2013-05-12 ENCOUNTER — Encounter: Payer: Self-pay | Admitting: Cardiovascular Disease

## 2013-05-12 ENCOUNTER — Ambulatory Visit (INDEPENDENT_AMBULATORY_CARE_PROVIDER_SITE_OTHER): Payer: Medicare Other | Admitting: Cardiovascular Disease

## 2013-05-12 VITALS — BP 160/75 | HR 46 | Ht 66.0 in | Wt 226.0 lb

## 2013-05-12 DIAGNOSIS — I739 Peripheral vascular disease, unspecified: Secondary | ICD-10-CM

## 2013-05-12 DIAGNOSIS — I2510 Atherosclerotic heart disease of native coronary artery without angina pectoris: Secondary | ICD-10-CM

## 2013-05-12 MED ORDER — AMLODIPINE BESYLATE 10 MG PO TABS: 10.0000 mg | ORAL_TABLET | Freq: Every day | ORAL | Status: DC

## 2013-05-12 NOTE — Patient Instructions (Signed)
Your physician wants you to follow-up in: 6 months.   You will receive a reminder letter in the mail two months in advance. If you don't receive a letter, please call our office to schedule the follow-up appointment.  Your physician has requested that you have an ankle brachial index (ABI). During this test an ultrasound and blood pressure cuff are used to evaluate the arteries that supply the arms and legs with blood. Allow thirty minutes for this exam. There are no restrictions or special instructions.   Your physician has recommended you make the following change in your medication:  Stop Toprol.  Increase amlodipine to 10 mg by mouth daily

## 2013-05-12 NOTE — Progress Notes (Signed)
History of Present Illness: 73 yo female with history of CAD, HTN, PAD, carotid artery disease, hyperlipidemia here today for cardiac followup. She has been followed in the past by Dr. Juanda ChanceBrodie. I saw her last in 2013. In 1992 she had an anterior MI treated with PCI. Her ejection fraction in 2007 was 45%.  She also has carotid disease and had Doppler studies June 2014 which showed 0-39% stenosis bilaterally. I ordered bilateral lower ext dopplers July 2012 which showed long occlusion of the right SFA and severe stenosis left CFA and SFA with ABI of 0.5 on the right and 0.63 on the left. ABI July 2013 0.45 on right and 0.59 on the left. In April 2014 she had weakness and has been followed by Neurology since then for CVA. 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 Multiple chronic ischemic infarctions scattered throughout the bilateral cerebellar hemispheres as well as right frontal region. Small chronic lacunar ischemic infarct in the left external capsule. 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 She has been on ASA and Plavix.   She is here today for follow up. She tells me today that she only has leg pain if she walks long distances. This has been stable. No rest pain or ulcerations. Overall feeling very well. She does not walk daily. No chest pain or SOB.   Primary Care Physician:  Last Lipid Profile:Lipid Panel     Component Value Date/Time   CHOL 129 10/29/2010 1142   TRIG 79.0 10/29/2010 1142   HDL 56.40 10/29/2010 1142   CHOLHDL 2 10/29/2010 1142   VLDL 15.8 10/29/2010 1142   LDLCALC 57 10/29/2010 1142     Past Medical History  Diagnosis Date  . MI (myocardial infarction) 421992    at age 73; TIA treated with coumadin until Plavix initiated in 2006  . Hyperlipidemia   . HTN (hypertension)   . Hypothyroidism   . DVT (deep venous  thrombosis)     X1  . CAD (coronary artery disease)     S/P  anterior  wall myocardial infarction in '92, treated w/percutaneous transluminal coronary angioplasty of the left anterior descending. EF 45%  . PAD (peripheral artery disease)     Right SFA occlusion, severe disease left CFA and SFA  . Carotid artery disease     Past Surgical History  Procedure Laterality Date  . Fracture lle      '94; pinned  . Appendectomy      at hysterectomy and USO for fibroids, Dr. Kizzie BaneHughes  . Tonsillectomy    . Colonoscopy      negative; 2009, Dr. Lina Sarora Brodie  . Cardiac catheterization  1992    Dr Charlies ConstableBruce Brodie  . Total abdominal hysterectomy      & BSO for fibroids    Current Outpatient Prescriptions  Medication Sig Dispense Refill  . amLODipine (NORVASC) 5 MG tablet TAKE 1 TABLET BY MOUTH DAILY  30 tablet  3  . Ascorbic Acid (VITAMIN C) 100 MG tablet Take 100 mg by mouth daily.        Marland Kitchen. aspirin 81 MG tablet Take 81 mg by mouth daily.      . benazepril (LOTENSIN) 20 MG tablet TAKE 1 TABLET BY MOUTH EVERY DAY  30 tablet  3  . clopidogrel (PLAVIX) 75 MG tablet TAKE ONE TABLET BY MOUTH EVERY DAY  30 tablet  1  . CRESTOR 20 MG tablet TAKE 1 TABLET BY MOUTH EVERY NIGHT AT BEDTIME  30 tablet  0  . fish oil-omega-3 fatty acids 1000 MG capsule Take 2 g by mouth daily.        . hydrochlorothiazide 25 MG tablet Take 1 tablet (25 mg total) by mouth daily. Take with OJ  30 tablet  11  . levothyroxine (SYNTHROID, LEVOTHROID) 25 MCG tablet Take 1 tablet (25 mcg total) by mouth daily.  30 tablet  11  . metoprolol succinate (TOPROL-XL) 25 MG 24 hr tablet Take 1/2 tablet po qd  15 tablet  6  . Multiple Vitamin (MULTIVITAMIN) tablet Take 1 tablet by mouth daily.        . TRAVATAN Z 0.004 % SOLN ophthalmic solution       . vitamin E 100 UNIT capsule Take 100 Units by mouth daily.         No current facility-administered medications for this visit.    Allergies  Allergen Reactions  . Cilostazol      Pletal  :" head felt funny"    History   Social History  . Marital Status: Married    Spouse Name: Fayrene Fearing    Number of Children: 1  . Years of Education: college   Occupational History  . Teacher     Retired   Social History Main Topics  . Smoking status: Former Smoker    Quit date: 03/11/1990  . Smokeless tobacco: Never Used     Comment: quit in 1992  . Alcohol Use: No  . Drug Use: No  . Sexual Activity: Not on file   Other Topics Concern  . 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 08/31/09- designated party form signed appointing, husband Quintisha Goodenow; ok to leave msg on home phone [phone].      Caffeine Small Amount one cup very rare.   Right handed.   One child- [name]    Family History  Problem Relation Age of Onset  . Diabetes Mother   . Hypertension Mother   . Stroke Brother     HTN    Review of Systems:  As stated in the HPI and otherwise negative.   BP 160/75  Pulse 46  Ht 5\' 6"  (1.676 m)  Wt 226 lb (102.513 kg)  BMI 36.49 kg/m2  Physical Examination: General: Well developed, well nourished, NAD HEENT: OP clear, mucus membranes moist SKIN: warm, dry. No rashes. Neuro: No focal deficits Musculoskeletal: Muscle strength 5/5 all ext Psychiatric: Mood and affect normal Neck: No JVD, no carotid bruits, no thyromegaly, no lymphadenopathy. Lungs:Clear bilaterally, no wheezes, rhonci, crackles Cardiovascular: Regular rate and rhythm. No murmurs, gallops or rubs. Abdomen:Soft. Bowel sounds present. Non-tender.  Extremities: No lower extremity edema. Pulses are non-palpable in the bilateral DP/PT.  Echo 12/28/12: Left ventricle: Mid ventricular LV band but no obvious mural apical thrombus. Consider MRI/ or contrast study to further assess if clinically indicated as endocardial definition on TTE is poor The cavity size was mildly dilated. Wall thickness was increased in a  pattern of mild LVH. Systolic function was moderately reduced. The estimated ejection fraction was in the range of 35% to 40%. Diffuse hypokinesis. - Mitral valve: Calcified annulus. Mildly thickened leaflets . Mild regurgitation. - Left atrium: The atrium was mildly dilated. - Atrial septum: No defect or patent foramen ovale was identified. - Pulmonary arteries:  PA peak pressure: 37mm Hg (S).  ZOX:WRUEA brady, rate 45 bpm. PACs. Non-specific T-wave abnormality  Assessment and Plan:   1. CAD: Stable. No recent chest pains. Continue ASA and Plavix.   2. PAD: ABI have been stable, last checked 2013. She is clinically stable. Repeat ABI now  3. Ischemic cardiomyopathy: Last LVEF=40% by echo October 2014. Continue Ace-inh. Stop Toprol 12.5 mg daily due to bradycardia.   4. Carotid artery disease: Mild bilateral disease by dopplers July 2013.

## 2013-05-14 ENCOUNTER — Encounter: Payer: Self-pay | Admitting: Internal Medicine

## 2013-05-14 ENCOUNTER — Ambulatory Visit (INDEPENDENT_AMBULATORY_CARE_PROVIDER_SITE_OTHER): Payer: Medicare Other | Admitting: Internal Medicine

## 2013-05-14 ENCOUNTER — Other Ambulatory Visit (INDEPENDENT_AMBULATORY_CARE_PROVIDER_SITE_OTHER): Payer: Medicare Other

## 2013-05-14 VITALS — BP 160/90 | HR 64 | Temp 97.6°F | Ht 65.5 in | Wt 224.8 lb

## 2013-05-14 DIAGNOSIS — R350 Frequency of micturition: Secondary | ICD-10-CM

## 2013-05-14 DIAGNOSIS — E785 Hyperlipidemia, unspecified: Secondary | ICD-10-CM

## 2013-05-14 DIAGNOSIS — R7309 Other abnormal glucose: Secondary | ICD-10-CM

## 2013-05-14 DIAGNOSIS — I6381 Other cerebral infarction due to occlusion or stenosis of small artery: Secondary | ICD-10-CM

## 2013-05-14 DIAGNOSIS — I739 Peripheral vascular disease, unspecified: Secondary | ICD-10-CM

## 2013-05-14 DIAGNOSIS — I10 Essential (primary) hypertension: Secondary | ICD-10-CM

## 2013-05-14 DIAGNOSIS — I6350 Cerebral infarction due to unspecified occlusion or stenosis of unspecified cerebral artery: Secondary | ICD-10-CM

## 2013-05-14 DIAGNOSIS — E039 Hypothyroidism, unspecified: Secondary | ICD-10-CM

## 2013-05-14 DIAGNOSIS — Z0000 Encounter for general adult medical examination without abnormal findings: Secondary | ICD-10-CM

## 2013-05-14 LAB — BASIC METABOLIC PANEL
BUN: 13 mg/dL (ref 6–23)
CO2: 27 mEq/L (ref 19–32)
Calcium: 9.5 mg/dL (ref 8.4–10.5)
Chloride: 106 mEq/L (ref 96–112)
Creatinine, Ser: 1 mg/dL (ref 0.4–1.2)
GFR: 74.19 mL/min (ref >60.00)
Glucose, Bld: 71 mg/dL (ref 70–99)
Potassium: 3.4 mEq/L — ABNORMAL LOW (ref 3.5–5.1)
Sodium: 142 mEq/L (ref 135–145)

## 2013-05-14 LAB — URINALYSIS
Bilirubin Urine: NEGATIVE
Hgb urine dipstick: NEGATIVE
Ketones, ur: NEGATIVE
Leukocytes, UA: NEGATIVE
Nitrite: NEGATIVE
Specific Gravity, Urine: 1.015 (ref 1.000–1.030)
Total Protein, Urine: NEGATIVE
Urine Glucose: NEGATIVE
Urobilinogen, UA: 0.2 (ref 0.0–1.0)
pH: 6 (ref 5.0–8.0)

## 2013-05-14 LAB — LIPID PANEL
Cholesterol: 238 mg/dL — ABNORMAL HIGH (ref 0–200)
HDL: 55.6 mg/dL (ref >39.00)
LDL Cholesterol: 155 mg/dL — ABNORMAL HIGH (ref 0–99)
Total CHOL/HDL Ratio: 4
Triglycerides: 139 mg/dL (ref 0.0–149.0)
VLDL: 27.8 mg/dL (ref 0.0–40.0)

## 2013-05-14 LAB — HEPATIC FUNCTION PANEL
ALT: 17 U/L (ref 0–35)
AST: 19 U/L (ref 0–37)
Albumin: 4.2 g/dL (ref 3.5–5.2)
Alkaline Phosphatase: 105 U/L (ref 39–117)
Bilirubin, Direct: 0.1 mg/dL (ref 0.0–0.3)
Total Bilirubin: 0.5 mg/dL (ref 0.3–1.2)
Total Protein: 7.4 g/dL (ref 6.0–8.3)

## 2013-05-14 LAB — TSH: TSH: 19.63 u[IU]/mL — ABNORMAL HIGH (ref 0.35–5.50)

## 2013-05-14 LAB — HEMOGLOBIN A1C: Hgb A1c MFr Bld: 5.5 % (ref 4.6–6.5)

## 2013-05-14 NOTE — Progress Notes (Signed)
Pre visit review using our clinic review tool, if applicable. No additional management support is needed unless otherwise documented below in the visit note. 

## 2013-05-14 NOTE — Patient Instructions (Signed)
Your next office appointment will be determined based upon review of your pending labs. Those instructions will be transmitted to you  by mail. Please review the medication list in the After Visit Summary provided.Please verify the medication name (this may be  brand or generic) & correct dosage. Write the name of the prescribing physician to the right of the medication and share this with all medical staff seen at each appointment. This will help provide continuity of care; help optimize therapeutic interventions;and help prevent drug:drug adverse reaction.

## 2013-05-14 NOTE — Progress Notes (Signed)
Subjective:    Patient ID: Yvonne Bailey, female    DOB: 05/24/1940, 73 y.o.   MRN: [id number]  HPI Medicare Wellness Visit: Psychosocial and medical history were reviewed as required by Medicare (history related to abuse, antisocial behavior , firearm risk). 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 Active issues reviewed and addressed as documented below.    Review of Systems Blood pressure range / average :no monitor Compliant with anti hypertemsive medication. No lightheadedness or other adverse medication effect described.  A heart healthy /low salt diet is followed. No regular exercise since 12/15;previously in [employer]. Significant headaches, epistaxis, chest pain, palpitations, exertional dyspnea, claudication, paroxysmal nocturnal dyspnea, or edema absent. Nocturia 4-5 X and frequency during day. Not on thyroid supplement; she does know why.     Objective:   Physical Exam Gen.: Healthy and well-nourished in appearance. Alert, appropriate and cooperative throughout exam.Weight excess Appears younger than stated age  Head: Normocephalic without obvious abnormalities  Eyes: No corneal or  conjunctival inflammation noted. Pupils equal round reactive to light and accommodation. Extraocular motion intact.  Ears: External  ear exam reveals no significant lesions or deformities. Canals clear .TMs normal. Hearing is grossly decreased bilaterally. Nose: External nasal exam reveals no deformity or inflammation. Nasal mucosa are pink and moist. No lesions or exudates noted.   Mouth: Oral mucosa and oropharynx reveal no lesions or exudates. Upper plate in good repair. Neck: No deformities, masses, or tenderness noted. Range of motion decreased. Thyroid small. Lungs: Normal respiratory effort; chest expands symmetrically. Lungs are clear to auscultation without rales, wheezes, or increased work of breathing. Heart: Normal rate and rhythm. Normal S1 and S2. No gallop, click, or rub.  Grade 1/6 systolic murmur Abdomen: Bowel sounds normal; abdomen soft and nontender. No masses, organomegaly or hernias noted. Genitalia: as per Gyn                                  Musculoskeletal/extremities: No deformity or scoliosis noted of  the thoracic or lumbar spine.. No clubbing, cyanosis, edema, or significant extremity  deformity noted. Range of motion normal .Tone & strength normal. Hand joints normal   Fingernail health good. Uncomfortable getting on table; examined in chair. Vascular: Carotid, radial artery, dorsalis pedis and  posterior tibial pulses are  equal. Decreased pedal pulses.R carotid bruit  present. Neurologic: Alert and oriented x3.  Gait broad , unsteady. Using cane. Skin: Intact without suspicious lesions or rashes. Lymph: No cervical, axillary lymphadenopathy present. Psych: Mood and affect are normal. Normally interactive  Assessment & Plan:  #1 Medicare Wellness Exam; criteria met ; data entered #2 Problem List/Diagnoses reviewed #3 nocturia & frequency Plan:  Assessments made/ Orders  entered ? Urology referral

## 2013-05-17 ENCOUNTER — Telehealth: Payer: Self-pay

## 2013-05-17 NOTE — Telephone Encounter (Signed)
Called patient, no answer, left voice mail on machine   Thanks!

## 2013-05-17 NOTE — Telephone Encounter (Signed)
Message copied by Newell Coral on Mon May 17, 2013  8:06 AM ------      Message from: Pecola Lawless      Created: Sun May 16, 2013  4:54 PM       Please schedule followup appointment this week before refilling meds. Please bring ALL ACTUAL pill bottles . Thanks, Hopp ------

## 2013-05-18 ENCOUNTER — Encounter: Payer: Self-pay | Admitting: *Deleted

## 2013-05-24 ENCOUNTER — Telehealth: Payer: Self-pay | Admitting: *Deleted

## 2013-05-24 DIAGNOSIS — E785 Hyperlipidemia, unspecified: Secondary | ICD-10-CM

## 2013-05-24 NOTE — Telephone Encounter (Signed)
Can we change her to Lipitor 40 mg po QHS and plan lipids and LFTs in 12 weeks? Thanks, chris

## 2013-05-24 NOTE — Telephone Encounter (Signed)
Patient called stating that she cannot afford the crestor and wants to see if you can change her to something cheaper. She is completely out, so she will come by the office to pick up samples. Thanks, MI

## 2013-05-25 MED ORDER — ATORVASTATIN CALCIUM 40 MG PO TABS
40.0000 mg | ORAL_TABLET | Freq: Every day | ORAL | Status: DC
Start: 2013-05-25 — End: 2014-02-01

## 2013-05-25 NOTE — Telephone Encounter (Signed)
Spoke with pt and she would like to change to Atorvastatin. She was just given a 4 week supply of Crestor so will finish this and then change to Atorvastatin 40 mg daily.  She will come in the last week of June for fasting lab work. Will send prescription to [facility] on [address]

## 2013-05-28 ENCOUNTER — Other Ambulatory Visit: Payer: Self-pay | Admitting: Internal Medicine

## 2013-06-07 ENCOUNTER — Other Ambulatory Visit (HOSPITAL_COMMUNITY): Payer: Self-pay | Admitting: Cardiology

## 2013-06-07 DIAGNOSIS — I739 Peripheral vascular disease, unspecified: Secondary | ICD-10-CM

## 2013-06-14 ENCOUNTER — Encounter: Payer: Self-pay | Admitting: Internal Medicine

## 2013-06-14 ENCOUNTER — Ambulatory Visit (HOSPITAL_COMMUNITY): Payer: Medicare Other | Attending: Internal Medicine | Admitting: Cardiology

## 2013-06-14 DIAGNOSIS — I739 Peripheral vascular disease, unspecified: Secondary | ICD-10-CM | POA: Insufficient documentation

## 2013-06-14 NOTE — Progress Notes (Signed)
ABI performed  

## 2013-06-17 ENCOUNTER — Telehealth: Payer: Self-pay | Admitting: Nurse Practitioner

## 2013-06-17 ENCOUNTER — Ambulatory Visit: Payer: Medicare Other | Admitting: Nurse Practitioner

## 2013-06-17 NOTE — Telephone Encounter (Signed)
No show for scheduled appointment 

## 2013-06-24 ENCOUNTER — Other Ambulatory Visit: Payer: Self-pay | Admitting: Internal Medicine

## 2013-08-06 ENCOUNTER — Telehealth: Payer: Self-pay | Admitting: Cardiovascular Disease

## 2013-08-06 NOTE — Telephone Encounter (Signed)
Walk In Pt Form " Disability pla-Card" Dropped Off Pat back Monday 6-1

## 2013-08-09 NOTE — Telephone Encounter (Signed)
Form completed and left at front desk for pt to pick up. Pt aware

## 2013-09-06 ENCOUNTER — Other Ambulatory Visit (INDEPENDENT_AMBULATORY_CARE_PROVIDER_SITE_OTHER): Payer: Medicare Other

## 2013-09-06 DIAGNOSIS — E785 Hyperlipidemia, unspecified: Secondary | ICD-10-CM

## 2013-09-06 LAB — HEPATIC FUNCTION PANEL
ALT: 15 U/L (ref 0–35)
AST: 21 U/L (ref 0–37)
Albumin: 4 g/dL (ref 3.5–5.2)
Alkaline Phosphatase: 102 U/L (ref 39–117)
Bilirubin, Direct: 0.1 mg/dL (ref 0.0–0.3)
Total Bilirubin: 0.6 mg/dL (ref 0.2–1.2)
Total Protein: 7.3 g/dL (ref 6.0–8.3)

## 2013-09-06 LAB — LIPID PANEL
Cholesterol: 136 mg/dL (ref 0–200)
HDL: 52 mg/dL (ref >39.00)
LDL Cholesterol: 71 mg/dL (ref 0–99)
NonHDL: 84
Total CHOL/HDL Ratio: 3
Triglycerides: 67 mg/dL (ref 0.0–149.0)
VLDL: 13.4 mg/dL (ref 0.0–40.0)

## 2013-09-07 ENCOUNTER — Other Ambulatory Visit: Payer: Self-pay | Admitting: Internal Medicine

## 2013-09-29 ENCOUNTER — Other Ambulatory Visit (HOSPITAL_COMMUNITY): Payer: Self-pay | Admitting: *Deleted

## 2013-09-29 DIAGNOSIS — I6529 Occlusion and stenosis of unspecified carotid artery: Secondary | ICD-10-CM

## 2013-10-06 ENCOUNTER — Ambulatory Visit (HOSPITAL_COMMUNITY): Payer: Medicare Other | Attending: Cardiovascular Disease | Admitting: *Deleted

## 2013-10-06 DIAGNOSIS — I6529 Occlusion and stenosis of unspecified carotid artery: Secondary | ICD-10-CM | POA: Diagnosis present

## 2013-10-06 NOTE — Progress Notes (Signed)
Carotid duplex complete 

## 2013-11-18 ENCOUNTER — Encounter: Payer: Self-pay | Admitting: Cardiovascular Disease

## 2013-11-18 ENCOUNTER — Ambulatory Visit (INDEPENDENT_AMBULATORY_CARE_PROVIDER_SITE_OTHER): Payer: Medicare Other | Admitting: Cardiovascular Disease

## 2013-11-18 VITALS — BP 130/78 | HR 62 | Ht 65.5 in | Wt 228.0 lb

## 2013-11-18 DIAGNOSIS — I739 Peripheral vascular disease, unspecified: Secondary | ICD-10-CM

## 2013-11-18 DIAGNOSIS — I2589 Other forms of chronic ischemic heart disease: Secondary | ICD-10-CM

## 2013-11-18 DIAGNOSIS — I10 Essential (primary) hypertension: Secondary | ICD-10-CM

## 2013-11-18 DIAGNOSIS — I779 Disorder of arteries and arterioles, unspecified: Secondary | ICD-10-CM

## 2013-11-18 DIAGNOSIS — I255 Ischemic cardiomyopathy: Secondary | ICD-10-CM

## 2013-11-18 DIAGNOSIS — I2510 Atherosclerotic heart disease of native coronary artery without angina pectoris: Secondary | ICD-10-CM

## 2013-11-18 MED ORDER — AMLODIPINE BESYLATE 5 MG PO TABS: 5.0000 mg | ORAL_TABLET | Freq: Every day | ORAL | Status: DC

## 2013-11-18 NOTE — Progress Notes (Signed)
History of Present Illness: 73 yo female with history of CAD, HTN, PAD, carotid artery disease, hyperlipidemia here today for cardiac followup. She had been followed in the past by Dr. Juanda Chance. I saw her last in 2013. In 1992 she had an anterior MI treated with PCI. Her ejection fraction in 2007 was 45%. She also has carotid disease and had Doppler studies June 2014 which showed 0-39% stenosis bilaterally. I ordered bilateral lower ext dopplers July 2012 which showed long occlusion of the right SFA and severe stenosis left CFA and SFA with ABI of 0.5 on the right and 0.63 on the left. ABI July 2013 0.45 on right and 0.59 on the left. In April 2014 she had weakness and has been followed by Neurology since then for CVA. 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 Multiple chronic ischemic infarctions scattered throughout the bilateral cerebellar hemispheres as well as right frontal region. Small chronic lacunar ischemic infarct in the left external capsule. 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 She has been on ASA and Plavix.   She is here today for follow up. She tells me today that she only has leg pain if she walks long distances. She can walk around the store without problems. This has been stable. No rest pain or ulcerations. Overall feeling very well. No chest pain or SOB. She has worsened ankle edema since having Norvasc raised to 10 mg daily.   Primary Care Physician: Dr. Alwyn Ren  Last Lipid Profile:Lipid Panel     Component Value Date/Time   CHOL 136 09/06/2013 0804   TRIG 67.0 09/06/2013 0804   HDL 52.00 09/06/2013 0804   CHOLHDL 3 09/06/2013 0804   VLDL 13.4 09/06/2013 0804   LDLCALC 71 09/06/2013 0804     Past Medical History  Diagnosis Date  . MI (myocardial infarction) 15    at age 51; TIA treated with coumadin until Plavix  initiated in 2006  . Hyperlipidemia   . HTN (hypertension)   . Hypothyroidism   . DVT (deep venous thrombosis)     X1  . CAD (coronary artery disease)     S/P  anterior  wall myocardial infarction in '92, treated w/percutaneous transluminal coronary angioplasty of the left anterior descending. EF 45%  . PAD (peripheral artery disease)     Right SFA occlusion, severe disease left CFA and SFA  . Carotid artery disease     Past Surgical History  Procedure Laterality Date  . Fracture lle      '94; pinned  . Appendectomy      at hysterectomy and USO for fibroids, Dr. Kizzie Bane  . Tonsillectomy    . Colonoscopy      negative; 2009, Dr. Lina Sar  . Cardiac catheterization  1992    Dr Charlies Constable  . Total abdominal hysterectomy      & BSO for fibroids    Current Outpatient Prescriptions  Medication Sig Dispense Refill  . amLODipine (NORVASC) 10 MG tablet Take 1 tablet (10 mg total) by mouth daily.  30 tablet  11  . Ascorbic Acid (VITAMIN C) 100 MG tablet Take 100 mg by mouth daily.        Marland Kitchen aspirin 81 MG tablet Take 81 mg by mouth daily.      Marland Kitchen atorvastatin (LIPITOR) 40 MG tablet Take 1 tablet (40 mg total)  by mouth daily.  30 tablet  11  . benazepril (LOTENSIN) 20 MG tablet Take 20 mg by mouth daily.      . cholecalciferol (VITAMIN D) 1000 UNITS tablet Take 1,000 Units by mouth daily.      . clopidogrel (PLAVIX) 75 MG tablet TAKE 1 TABLET BY MOUTH DAILY  90 tablet  3  . fish oil-omega-3 fatty acids 1000 MG capsule Take 2 g by mouth daily.        . Multiple Vitamin (MULTIVITAMIN) tablet Take 1 tablet by mouth daily.        . TRAVATAN Z 0.004 % SOLN ophthalmic solution       . vitamin E 100 UNIT capsule Take 100 Units by mouth daily.         No current facility-administered medications for this visit.    Allergies  Allergen Reactions  . Cilostazol      Pletal :" head felt funny"    History   Social History  . Marital Status: Married    Spouse Name: Fayrene Fearing    Number of  Children: 1  . Years of Education: college   Occupational History  . Teacher     Retired   Social History Main Topics  . Smoking status: Former Smoker    Quit date: 03/11/1990  . Smokeless tobacco: Never Used     Comment: smoked 1973- 1992, up to < 5 cigarettes  . Alcohol Use: No  . Drug Use: No  . Sexual Activity: Not on file   Other Topics Concern  . 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 08/31/09- designated party form signed appointing, husband Jana Swartzlander; ok to leave msg on home phone [phone].      Caffeine Small Amount one cup very rare.   Right handed.   One child- [name]    Family History  Problem Relation Age of Onset  . Diabetes Mother   . Hypertension Mother   . Stroke Brother     HTN  . Coronary artery disease Brother     stent in 46s  . Cancer Neg Hx     Review of Systems:  As stated in the HPI and otherwise negative.   BP 130/78  Pulse 62  Ht 5' 5.5" (1.664 m)  Wt 228 lb (103.42 kg)  BMI 37.35 kg/m2  SpO2 98%  Physical Examination: General: Well developed, well nourished, NAD HEENT: OP clear, mucus membranes moist SKIN: warm, dry. No rashes. Neuro: No focal deficits Musculoskeletal: Muscle strength 5/5 all ext Psychiatric: Mood and affect normal Neck: No JVD, no carotid bruits, no thyromegaly, no lymphadenopathy. Lungs:Clear bilaterally, no wheezes, rhonci, crackles Cardiovascular: Regular rate and rhythm. No murmurs, gallops or rubs. Abdomen:Soft. Bowel sounds present. Non-tender.  Extremities: No lower extremity edema. Pulses are non-palpable in the bilateral DP/PT.  Echo 12/28/12: Left ventricle: Mid ventricular LV band but no obvious mural apical thrombus. Consider MRI/ or contrast study to further assess if clinically indicated as endocardial definition on TTE is poor The cavity size was mildly dilated. Wall thickness was increased in a pattern of  mild LVH. Systolic function was moderately reduced. The estimated ejection fraction was in the range of 35% to 40%. Diffuse hypokinesis. - Mitral valve: Calcified annulus. Mildly thickened leaflets . Mild regurgitation. - Left atrium: The atrium was mildly dilated. - Atrial septum: No defect or patent foramen ovale was identified. - Pulmonary arteries: PA  peak pressure: 50mm Hg (S).  Assessment and Plan:   1. CAD: Stable. No recent chest pains. Continue ASA and Plavix.   2. PAD: ABI have been stable, last checked April 2015.  Right ABI 0.45, Left ABI 0.59.  She is clinically stable. Repeat April 2016.   3. Ischemic cardiomyopathy: Last LVEF=40% by echo October 2014. Continue Ace-inh. Toprol was stopped secondary to bradycardia.   4. Carotid artery disease: Mild bilateral disease by dopplers July 2015. Repeat July 2017.   5. HTN: BP is well controlled. She things she has more swelling in her ankles since it was increased to 10 mg. Will lower Norvasc to 5 mg daily.

## 2013-11-18 NOTE — Patient Instructions (Signed)
Your physician wants you to follow-up in: 6 months.   You will receive a reminder letter in the mail two months in advance. If you don't receive a letter, please call our office to schedule the follow-up appointment.  Your physician has requested that you have an ankle brachial index (ABI). During this test an ultrasound and blood pressure cuff are used to evaluate the arteries that supply the arms and legs with blood. Allow thirty minutes for this exam. There are no restrictions or special instructions. To be done in 6 months on day of appt with Dr. Clifton James  Your physician has recommended you make the following change in your medication:  Decrease Norvasc to 5 mg by mouth daily

## 2014-01-26 ENCOUNTER — Other Ambulatory Visit: Payer: Self-pay

## 2014-01-26 DIAGNOSIS — I10 Essential (primary) hypertension: Secondary | ICD-10-CM

## 2014-01-26 MED ORDER — CLOPIDOGREL BISULFATE 75 MG PO TABS: 75.0000 mg | ORAL_TABLET | Freq: Every day | ORAL | Status: DC

## 2014-01-26 MED ORDER — BENAZEPRIL HCL 20 MG PO TABS: 20.0000 mg | ORAL_TABLET | Freq: Every day | ORAL | Status: DC

## 2014-01-26 MED ORDER — AMLODIPINE BESYLATE 5 MG PO TABS: 5.0000 mg | ORAL_TABLET | Freq: Every day | ORAL | Status: DC

## 2014-02-01 ENCOUNTER — Other Ambulatory Visit: Payer: Self-pay

## 2014-02-01 MED ORDER — ATORVASTATIN CALCIUM 40 MG PO TABS: 40.0000 mg | ORAL_TABLET | Freq: Every day | ORAL | Status: DC

## 2014-04-23 DIAGNOSIS — M79672 Pain in left foot: Secondary | ICD-10-CM | POA: Diagnosis not present

## 2014-05-06 ENCOUNTER — Encounter: Payer: Self-pay | Admitting: Internal Medicine

## 2014-05-06 ENCOUNTER — Ambulatory Visit (INDEPENDENT_AMBULATORY_CARE_PROVIDER_SITE_OTHER): Payer: Commercial Managed Care - HMO | Admitting: Internal Medicine

## 2014-05-06 VITALS — BP 142/80 | HR 90 | Temp 98.3°F | Resp 15 | Ht 66.0 in | Wt 224.5 lb

## 2014-05-06 DIAGNOSIS — K5730 Diverticulosis of large intestine without perforation or abscess without bleeding: Secondary | ICD-10-CM | POA: Diagnosis not present

## 2014-05-06 DIAGNOSIS — E038 Other specified hypothyroidism: Secondary | ICD-10-CM

## 2014-05-06 DIAGNOSIS — I739 Peripheral vascular disease, unspecified: Secondary | ICD-10-CM

## 2014-05-06 DIAGNOSIS — I2510 Atherosclerotic heart disease of native coronary artery without angina pectoris: Secondary | ICD-10-CM

## 2014-05-06 DIAGNOSIS — I10 Essential (primary) hypertension: Secondary | ICD-10-CM | POA: Diagnosis not present

## 2014-05-06 DIAGNOSIS — E785 Hyperlipidemia, unspecified: Secondary | ICD-10-CM

## 2014-05-06 NOTE — Progress Notes (Signed)
Pre visit review using our clinic review tool, if applicable. No additional management support is needed unless otherwise documented below in the visit note. 

## 2014-05-06 NOTE — Assessment & Plan Note (Signed)
Blood pressure goals reviewed. BMET 

## 2014-05-06 NOTE — Assessment & Plan Note (Signed)
TSH 

## 2014-05-06 NOTE — Progress Notes (Signed)
Subjective:    Patient ID: Yvonne Bailey, female    DOB: [birth year], 75 y.o.   MRN: [id number]  HPI The patient is here to assess status of active health conditions.  PMH, FH, & Social History reviewed & updated.  She has her food shipped to her (? [employer]); these are low-fat, low-salt, modified heart healthy. She is unable to exercise due to orthopedic issues.  She's been compliant with her medications without adverse effects.  She denies any active cardiopulmonary symptoms. She has had edema of the left ankle related to previous fracture which occurred 1994.  She is not monitoring the blood pressure at home.  Her colonoscopy was negative in 2008; she has no active GI symptoms. She would be due for follow-up in 2018.   Review of Systems   Significant headaches, epistaxis, chest pain, palpitations, exertional dyspnea, claudication, paroxysmal nocturnal dyspnea, or edema absent. No GI symptoms , memory loss or myalgias Unexplained weight loss, abdominal pain, significant dyspepsia, dysphagia, melena, rectal bleeding, or persistently small caliber stools are denied.     Objective:   Physical Exam  Gen.: Adequately nourished in appearance. Alert, appropriate and cooperative throughout exam. BMI:36.25 Appears younger than stated age  Head: Normocephalic without obvious abnormalities  Eyes: No corneal or conjunctival inflammation noted. Pupils equal round reactive to light and accommodation. Extraocular motion intact.  Ears: External  ear exam reveals no significant lesions or deformities. Wax on R Left TM is normal. Hearing is grossly decreased bilaterally. Nose: External nasal exam reveals no deformity or inflammation. Nasal mucosa are pink and moist. No lesions or exudates noted.   Mouth: Oral mucosa and oropharynx reveal no lesions or exudates. Upper plate. Neck: Prominent larynx to palpation No deformities, masses, or tenderness noted. Range of motion &Thyroid  normal. Lungs: Normal respiratory effort; chest expands symmetrically. Lungs are clear to auscultation without rales, wheezes, or increased work of breathing. Heart: Normal rate ;slightly irregular rhythm. Normal S1 and S2. No gallop, click, or rub. No murmur. Abdomen: Bowel sounds normal; abdomen soft and nontender. No masses, organomegaly or hernias noted. Genitalia: as per Gyn                                  Musculoskeletal/extremities: No deformity or scoliosis noted of  the thoracic or lumbar spine. No clubbing, cyanosis,  or significant extremity  deformity noted. Slight lipedema changes of feet Range of motion normal . Tone & strength normal. Hand joints normal  Fingernail  health good. Crepitus of knees with fusiform changes Able to lie down & sit up w/o help.  Negative SLR bilaterally Vascular: Carotid, radial artery, dorsalis pedis and  posterior tibial pulses are  equal. Decreased pedal pulses.No bruits present. Neurologic: Alert and oriented x3. Deep tendon reflexes symmetrical and normal.  Gait:uses cane; needed help getting on exam table    Skin: Intact without suspicious lesions or rashes. Lymph: No cervical, axillary lymphadenopathy present. Psych: Mood and affect are normal. Normally interactive  Assessment & Plan:  See Current Assessment & Plan in Problem List under specific Diagnosis

## 2014-05-06 NOTE — Assessment & Plan Note (Signed)
Lipids, LFTs, TSH  

## 2014-05-06 NOTE — Assessment & Plan Note (Signed)
CBC

## 2014-05-06 NOTE — Patient Instructions (Addendum)
Minimal Blood Pressure Goal= AVERAGE < 140/90;  Ideal is an AVERAGE < 135/85. This AVERAGE should be calculated from @ least 5-7 BP readings taken @ different times of day on different days of week. You should not respond to isolated BP readings , but rather the AVERAGE for that week .Please bring your  blood pressure cuff to office visits to verify that it is reliable.It  can also be checked against the blood pressure device at the pharmacy. Finger or wrist cuffs are not dependable; an arm cuff is. Your next office appointment will be determined based upon review of your pending labs  Those instructions will be transmitted to you by mail. Critical values will be called. Followup as needed for any active or acute issue.   Referrals to Dr Earlene Plater ,Ophthmalogy & Dr Clifton James

## 2014-05-09 ENCOUNTER — Other Ambulatory Visit (INDEPENDENT_AMBULATORY_CARE_PROVIDER_SITE_OTHER): Payer: Commercial Managed Care - HMO

## 2014-05-09 DIAGNOSIS — K5730 Diverticulosis of large intestine without perforation or abscess without bleeding: Secondary | ICD-10-CM | POA: Diagnosis not present

## 2014-05-09 DIAGNOSIS — I10 Essential (primary) hypertension: Secondary | ICD-10-CM | POA: Diagnosis not present

## 2014-05-09 DIAGNOSIS — E785 Hyperlipidemia, unspecified: Secondary | ICD-10-CM | POA: Diagnosis not present

## 2014-05-09 DIAGNOSIS — E038 Other specified hypothyroidism: Secondary | ICD-10-CM | POA: Diagnosis not present

## 2014-05-09 LAB — CBC WITH DIFFERENTIAL/PLATELET
Basophils Absolute: 0 10*3/uL (ref 0.0–0.1)
Basophils Relative: 0.5 % (ref 0.0–3.0)
Eosinophils Absolute: 0.1 10*3/uL (ref 0.0–0.7)
Eosinophils Relative: 1.4 % (ref 0.0–5.0)
HCT: 39.7 % (ref 36.0–46.0)
Hemoglobin: 13.3 g/dL (ref 12.0–15.0)
Lymphocytes Relative: 33.3 % (ref 12.0–46.0)
Lymphs Abs: 2.9 10*3/uL (ref 0.7–4.0)
MCHC: 33.6 g/dL (ref 30.0–36.0)
MCV: 91.6 fl (ref 78.0–100.0)
Monocytes Absolute: 0.6 10*3/uL (ref 0.1–1.0)
Monocytes Relative: 6.8 % (ref 3.0–12.0)
Neutro Abs: 5.1 10*3/uL (ref 1.4–7.7)
Neutrophils Relative %: 58 % (ref 43.0–77.0)
Platelets: 288 10*3/uL (ref 150.0–400.0)
RBC: 4.33 Mil/uL (ref 3.87–5.11)
RDW: 13.6 % (ref 11.5–15.5)
WBC: 8.9 10*3/uL (ref 4.0–10.5)

## 2014-05-09 LAB — BASIC METABOLIC PANEL
BUN: 13 mg/dL (ref 6–23)
CO2: 28 mEq/L (ref 19–32)
Calcium: 9.4 mg/dL (ref 8.4–10.5)
Chloride: 108 mEq/L (ref 96–112)
Creatinine, Ser: 1.17 mg/dL (ref 0.40–1.20)
GFR: 58.18 mL/min — ABNORMAL LOW (ref >60.00)
Glucose, Bld: 107 mg/dL — ABNORMAL HIGH (ref 70–99)
Potassium: 4 mEq/L (ref 3.5–5.1)
Sodium: 143 mEq/L (ref 135–145)

## 2014-05-09 LAB — HEPATIC FUNCTION PANEL
ALT: 19 U/L (ref 0–35)
AST: 23 U/L (ref 0–37)
Albumin: 4 g/dL (ref 3.5–5.2)
Alkaline Phosphatase: 114 U/L (ref 39–117)
Bilirubin, Direct: 0.1 mg/dL (ref 0.0–0.3)
Total Bilirubin: 0.7 mg/dL (ref 0.2–1.2)
Total Protein: 7.2 g/dL (ref 6.0–8.3)

## 2014-05-09 LAB — LIPID PANEL
Cholesterol: 138 mg/dL (ref 0–200)
HDL: 44.3 mg/dL (ref >39.00)
LDL Cholesterol: 80 mg/dL (ref 0–99)
NonHDL: 93.7
Total CHOL/HDL Ratio: 3
Triglycerides: 71 mg/dL (ref 0.0–149.0)
VLDL: 14.2 mg/dL (ref 0.0–40.0)

## 2014-05-09 LAB — TSH: TSH: 19.62 u[IU]/mL — ABNORMAL HIGH (ref 0.35–4.50)

## 2014-05-10 ENCOUNTER — Other Ambulatory Visit: Payer: Self-pay | Admitting: Internal Medicine

## 2014-05-10 ENCOUNTER — Other Ambulatory Visit (INDEPENDENT_AMBULATORY_CARE_PROVIDER_SITE_OTHER): Payer: Commercial Managed Care - HMO

## 2014-05-10 ENCOUNTER — Telehealth: Payer: Self-pay

## 2014-05-10 DIAGNOSIS — R7309 Other abnormal glucose: Secondary | ICD-10-CM

## 2014-05-10 DIAGNOSIS — E038 Other specified hypothyroidism: Secondary | ICD-10-CM

## 2014-05-10 LAB — HEMOGLOBIN A1C: Hgb A1c MFr Bld: 5.6 % (ref 4.6–6.5)

## 2014-05-10 MED ORDER — LEVOTHYROXINE SODIUM 25 MCG PO TABS: 25.0000 ug | ORAL_TABLET | Freq: Every day | ORAL | Status: DC

## 2014-05-10 NOTE — Telephone Encounter (Signed)
Request for a1c sent to lab  Thyroid med has been sent to pharmacy

## 2014-05-10 NOTE — Addendum Note (Signed)
Addended by: Lyanne Co R on: 05/10/2014 08:06 AM   Modules accepted: Orders

## 2014-05-10 NOTE — Telephone Encounter (Signed)
-----   Message from Pecola Lawless, MD sent at 05/10/2014  6:25 AM EST ----- Please add A1c (R73.9) Also please see new Rx for thyroid med

## 2014-05-17 ENCOUNTER — Other Ambulatory Visit: Payer: Self-pay

## 2014-05-17 DIAGNOSIS — E038 Other specified hypothyroidism: Secondary | ICD-10-CM

## 2014-05-17 MED ORDER — LEVOTHYROXINE SODIUM 25 MCG PO TABS: 25.0000 ug | ORAL_TABLET | Freq: Every day | ORAL | Status: DC

## 2014-05-18 ENCOUNTER — Ambulatory Visit: Payer: Medicare Other | Admitting: Cardiovascular Disease

## 2014-05-18 DIAGNOSIS — H35363 Drusen (degenerative) of macula, bilateral: Secondary | ICD-10-CM | POA: Diagnosis not present

## 2014-05-18 DIAGNOSIS — H4011X4 Primary open-angle glaucoma, indeterminate stage: Secondary | ICD-10-CM | POA: Diagnosis not present

## 2014-05-18 DIAGNOSIS — H35033 Hypertensive retinopathy, bilateral: Secondary | ICD-10-CM | POA: Diagnosis not present

## 2014-05-18 DIAGNOSIS — H2513 Age-related nuclear cataract, bilateral: Secondary | ICD-10-CM | POA: Diagnosis not present

## 2014-05-23 ENCOUNTER — Ambulatory Visit (INDEPENDENT_AMBULATORY_CARE_PROVIDER_SITE_OTHER): Payer: Commercial Managed Care - HMO | Admitting: Cardiovascular Disease

## 2014-05-23 ENCOUNTER — Encounter: Payer: Self-pay | Admitting: Cardiovascular Disease

## 2014-05-23 VITALS — BP 138/86 | HR 73 | Ht 66.0 in | Wt 220.4 lb

## 2014-05-23 DIAGNOSIS — I10 Essential (primary) hypertension: Secondary | ICD-10-CM

## 2014-05-23 DIAGNOSIS — I739 Peripheral vascular disease, unspecified: Secondary | ICD-10-CM

## 2014-05-23 DIAGNOSIS — I2510 Atherosclerotic heart disease of native coronary artery without angina pectoris: Secondary | ICD-10-CM

## 2014-05-23 DIAGNOSIS — I481 Persistent atrial fibrillation: Secondary | ICD-10-CM | POA: Diagnosis not present

## 2014-05-23 DIAGNOSIS — I779 Disorder of arteries and arterioles, unspecified: Secondary | ICD-10-CM

## 2014-05-23 DIAGNOSIS — I255 Ischemic cardiomyopathy: Secondary | ICD-10-CM | POA: Diagnosis not present

## 2014-05-23 DIAGNOSIS — I4819 Other persistent atrial fibrillation: Secondary | ICD-10-CM

## 2014-05-23 MED ORDER — APIXABAN 5 MG PO TABS: 5.0000 mg | ORAL_TABLET | Freq: Two times a day (BID) | ORAL | Status: DC

## 2014-05-23 MED ORDER — METOPROLOL SUCCINATE ER 25 MG PO TB24: 25.0000 mg | ORAL_TABLET | Freq: Every day | ORAL | Status: DC

## 2014-05-23 NOTE — Progress Notes (Signed)
History of Present Illness: 74 yo female with history of CAD, HTN, PAD, carotid artery disease, hyperlipidemia here today for cardiac followup. She had been followed in the past by Dr. Juanda Chance. In 1992 she had an anterior MI treated with PCI. Her ejection fraction in 2007 was 45%. She also has carotid disease and had Doppler studies June 2014 which showed 0-39% stenosis bilaterally. I ordered bilateral lower ext dopplers July 2012 which showed long occlusion of the right SFA and severe stenosis left CFA and SFA with ABI of 0.5 on the right and 0.63 on the left. ABI July 2013 0.45 on right and 0.59 on the left. In April 2014 she had weakness and has been followed by Neurology since then for CVA. 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 Multiple chronic ischemic infarctions scattered throughout the bilateral cerebellar hemispheres as well as right frontal region. Small chronic lacunar ischemic infarct in the left external capsule. 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 She has been on ASA and Plavix. She had increased lower extremity swelling on 10 mg of Norvasc so this was lowered to 5 mg daily.   She is here today for follow up. She tells me today that she only has leg pain if she walks long distances. No rest pain or ulcerations. Overall feeling very well. No chest pain or SOB. No awareness of palpitations.   Primary Care Physician: Dr. Alwyn Ren  Last Lipid Profile:Lipid Panel     Component Value Date/Time   CHOL 138 05/09/2014 0930   TRIG 71.0 05/09/2014 0930   HDL 44.30 05/09/2014 0930   CHOLHDL 3 05/09/2014 0930   VLDL 14.2 05/09/2014 0930   LDLCALC 80 05/09/2014 0930     Past Medical History  Diagnosis Date  . MI (myocardial infarction) 51    at age 49; TIA treated with coumadin until Plavix initiated in 2006  .  Hyperlipidemia   . HTN (hypertension)   . Hypothyroidism   . DVT (deep venous thrombosis)     X1  . CAD (coronary artery disease)     S/P  anterior  wall myocardial infarction in '92, treated w/percutaneous transluminal coronary angioplasty of the left anterior descending. EF 45%  . PAD (peripheral artery disease)     Right SFA occlusion, severe disease left CFA and SFA  . Carotid artery disease     Past Surgical History  Procedure Laterality Date  . Fracture lle      '94; pinned  . Appendectomy      at hysterectomy and USO for fibroids, Dr. Kizzie Bane  . Tonsillectomy    . Colonoscopy      negative; 2008, Dr. Lina Sar  . Cardiac catheterization  1992    Dr Charlies Constable  . Total abdominal hysterectomy      & BSO for fibroids    Current Outpatient Prescriptions  Medication Sig Dispense Refill  . amLODipine (NORVASC) 5 MG tablet Take 1 tablet (5 mg total) by mouth daily. 90 tablet 1  . Ascorbic Acid (VITAMIN C) 100 MG tablet Take 100 mg by mouth daily.      Marland Kitchen aspirin 81 MG tablet Take 81 mg by mouth daily.    Marland Kitchen atorvastatin (LIPITOR) 40 MG tablet Take 1 tablet (40 mg total) by mouth daily. 90 tablet 3  . benazepril (LOTENSIN) 20 MG tablet Take  1 tablet (20 mg total) by mouth daily. 90 tablet 1  . cholecalciferol (VITAMIN D) 1000 UNITS tablet Take 1,000 Units by mouth daily.    . clopidogrel (PLAVIX) 75 MG tablet Take 1 tablet (75 mg total) by mouth daily. 90 tablet 1  . fish oil-omega-3 fatty acids 1000 MG capsule Take 2 g by mouth daily.      Marland Kitchen levothyroxine (LEVOTHROID) 25 MCG tablet Take 1 tablet (25 mcg total) by mouth daily before breakfast. 90 tablet 0  . Multiple Vitamin (MULTIVITAMIN) tablet Take 1 tablet by mouth daily.      . TRAVATAN Z 0.004 % SOLN ophthalmic solution     . vitamin E 100 UNIT capsule Take 100 Units by mouth daily.       No current facility-administered medications for this visit.    Allergies  Allergen Reactions  . Cilostazol      Pletal :"  head felt funny"    History   Social History  . Marital Status: Married    Spouse Name: Fayrene Fearing  . Number of Children: 1  . Years of Education: college   Occupational History  . Teacher     Retired   Social History Main Topics  . Smoking status: Former Smoker    Quit date: 03/11/1990  . Smokeless tobacco: Never Used     Comment: smoked 1973- 1992, up to < 5 cigarettes  . Alcohol Use: No  . Drug Use: No  . Sexual Activity: Not on file   Other Topics Concern  . 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 08/31/09- designated party form signed appointing, husband Kayliani Tober; ok to leave msg on home phone [phone].      Caffeine Small Amount one cup very rare.   Right handed.   One child- [name]    Family History  Problem Relation Age of Onset  . Diabetes Mother   . Hypertension Mother   . Stroke Brother     ?> 55  . Coronary artery disease Brother     stent in 66s  . Cancer Neg Hx     Review of Systems:  As stated in the HPI and otherwise negative.   BP 138/86 mmHg  Pulse 73  Ht 5\' 6"  (1.676 m)  Wt 220 lb 6.4 oz (99.973 kg)  BMI 35.59 kg/m2  Physical Examination: General: Well developed, well nourished, NAD HEENT: OP clear, mucus membranes moist SKIN: warm, dry. No rashes. Neuro: No focal deficits Musculoskeletal: Muscle strength 5/5 all ext Psychiatric: Mood and affect normal Neck: No JVD, no carotid bruits, no thyromegaly, no lymphadenopathy. Lungs:Clear bilaterally, no wheezes, rhonci, crackles Cardiovascular: Regular rate and rhythm. No murmurs, gallops or rubs. Abdomen:Soft. Bowel sounds present. Non-tender.  Extremities: No lower extremity edema. Pulses are non-palpable in the bilateral DP/PT.  Echo 12/28/12: Left ventricle: Mid ventricular LV band but no obvious mural apical thrombus. Consider MRI/ or contrast study to further assess if clinically indicated as  endocardial definition on TTE is poor The cavity size was mildly dilated. Wall thickness was increased in a pattern of mild LVH. Systolic function was moderately reduced. The estimated ejection fraction was in the range of 35% to 40%. Diffuse hypokinesis. - Mitral valve: Calcified annulus. Mildly thickened leaflets . Mild regurgitation. - Left atrium: The atrium was mildly dilated. - Atrial septum: No defect or patent foramen ovale was identified. - Pulmonary arteries: PA peak pressure:  37mm Hg (S).  EKG: 05/06/14: atrial fib.   Assessment and Plan:   1. CAD: Stable. No recent chest pains. Continue ASA. Will stop Plavix with new diagnosis of atrial fibrillation and need for anti-coagulation.   2. PAD: ABI have been stable, last checked April 2015.  Right ABI 0.45, Left ABI 0.59.  She is clinically stable. Repeat now.   3. Ischemic cardiomyopathy: Last LVEF=40% by echo October 2014. Continue Ace-inh.   4. Carotid artery disease: Mild bilateral disease by dopplers July 2015. Repeat July 2017.   5. HTN: BP is well controlled.    6. Atrial fibrillation: New onset. Has been present since at least 05/06/14 when her EKG was done in primary care. We have discussed the risks of CVA. Her CHADSVASC score is 6 (9.7% risk of CVA per year). Will stop Plavix and will start Eliquis 5 mg po BID. Will restart Toprol 25 mg daily. I will see her back one month.

## 2014-05-23 NOTE — Patient Instructions (Signed)
Your physician recommends that you schedule a follow-up appointment in: about 4 weeks.  Scheduled for June 21, 2014 at 2:30  Your physician has recommended you make the following change in your medication:   Stop Clopidogrel. Start Eliquis 5 mg by mouth twice daily. Start Toprol 25 mg by mouth daily

## 2014-05-27 ENCOUNTER — Telehealth: Payer: Self-pay | Admitting: Internal Medicine

## 2014-05-27 DIAGNOSIS — H409 Unspecified glaucoma: Secondary | ICD-10-CM

## 2014-05-27 NOTE — Telephone Encounter (Signed)
Need Dx for referral (Ex glaucoma, macular degeneration,etc) It's OK to enter as soon as we have supprting Dx

## 2014-05-27 NOTE — Telephone Encounter (Signed)
Called pt no answer LMOM md place referral.../lmb

## 2014-05-27 NOTE — Telephone Encounter (Signed)
Patient need a referral see to Dr Joni Reining Doctor. Please advise

## 2014-05-31 DIAGNOSIS — H40052 Ocular hypertension, left eye: Secondary | ICD-10-CM | POA: Diagnosis not present

## 2014-05-31 DIAGNOSIS — H40051 Ocular hypertension, right eye: Secondary | ICD-10-CM | POA: Diagnosis not present

## 2014-05-31 DIAGNOSIS — H4011X4 Primary open-angle glaucoma, indeterminate stage: Secondary | ICD-10-CM | POA: Diagnosis not present

## 2014-06-14 DIAGNOSIS — H4011X4 Primary open-angle glaucoma, indeterminate stage: Secondary | ICD-10-CM | POA: Diagnosis not present

## 2014-06-21 ENCOUNTER — Ambulatory Visit (INDEPENDENT_AMBULATORY_CARE_PROVIDER_SITE_OTHER): Payer: Commercial Managed Care - HMO | Admitting: Cardiovascular Disease

## 2014-06-21 ENCOUNTER — Encounter: Payer: Self-pay | Admitting: Cardiovascular Disease

## 2014-06-21 VITALS — BP 140/70 | HR 90 | Ht 66.0 in | Wt 218.2 lb

## 2014-06-21 DIAGNOSIS — I481 Persistent atrial fibrillation: Secondary | ICD-10-CM

## 2014-06-21 DIAGNOSIS — I2510 Atherosclerotic heart disease of native coronary artery without angina pectoris: Secondary | ICD-10-CM

## 2014-06-21 DIAGNOSIS — I779 Disorder of arteries and arterioles, unspecified: Secondary | ICD-10-CM | POA: Diagnosis not present

## 2014-06-21 DIAGNOSIS — I4819 Other persistent atrial fibrillation: Secondary | ICD-10-CM

## 2014-06-21 DIAGNOSIS — I739 Peripheral vascular disease, unspecified: Secondary | ICD-10-CM | POA: Diagnosis not present

## 2014-06-21 DIAGNOSIS — I10 Essential (primary) hypertension: Secondary | ICD-10-CM

## 2014-06-21 DIAGNOSIS — I255 Ischemic cardiomyopathy: Secondary | ICD-10-CM | POA: Diagnosis not present

## 2014-06-21 MED ORDER — APIXABAN 5 MG PO TABS: 5.0000 mg | ORAL_TABLET | Freq: Two times a day (BID) | ORAL | Status: DC

## 2014-06-21 NOTE — Progress Notes (Signed)
Chief Complaint  Patient presents with  . Leg Swelling   History of Present Illness: 74 yo female with history of CAD, HTN, PAD, carotid artery disease, hyperlipidemia here today for cardiac followup. She had been followed in the past by Dr. Juanda Chance. In 1992 she had an anterior MI treated with PCI. Her ejection fraction in 2007 was 45%. She also has carotid disease and had Doppler studies June 2014 which showed 0-39% stenosis bilaterally. I ordered bilateral lower ext dopplers July 2012 which showed long occlusion of the right SFA and severe stenosis left CFA and SFA with ABI of 0.5 on the right and 0.63 on the left. ABI April 2015 with 0.45 on right and 0.59 on the left. In April 204 she had weakness and has been followed by Neurology since then for CVA. She had increased lower extremity swelling on 10 mg of Norvasc so this was lowered to 5 mg daily. I saw her in the office 05/23/14 and she was in atrial fibrillation. Eliquis was started for anti-coagulation. Toprol was started for rate control.   She is here today for follow up. She tells me today that she only has leg pain if she walks long distances. No rest pain or ulcerations. Overall feeling very well. No chest pain or SOB. No awareness of palpitations.   Primary Care Physician: Dr. Alwyn Ren  Last Lipid Profile:Lipid Panel     Component Value Date/Time   CHOL 138 05/09/2014 0930   TRIG 71.0 05/09/2014 0930   HDL 44.30 05/09/2014 0930   CHOLHDL 3 05/09/2014 0930   VLDL 14.2 05/09/2014 0930   LDLCALC 80 05/09/2014 0930     Past Medical History  Diagnosis Date  . MI (myocardial infarction) 64    at age 31; TIA treated with coumadin until Plavix initiated in 2006  . Hyperlipidemia   . HTN (hypertension)   . Hypothyroidism   . DVT (deep venous thrombosis)     X1  . CAD (coronary artery disease)     S/P  anterior  wall myocardial infarction in '92, treated w/percutaneous transluminal coronary angioplasty of the left anterior  descending. EF 45%  . PAD (peripheral artery disease)     Right SFA occlusion, severe disease left CFA and SFA  . Carotid artery disease     Past Surgical History  Procedure Laterality Date  . Fracture lle      '94; pinned  . Appendectomy      at hysterectomy and USO for fibroids, Dr. Kizzie Bane  . Tonsillectomy    . Colonoscopy      negative; 2008, Dr. Lina Sar  . Cardiac catheterization  1992    Dr Charlies Constable  . Total abdominal hysterectomy      & BSO for fibroids    Current Outpatient Prescriptions  Medication Sig Dispense Refill  . amLODipine (NORVASC) 5 MG tablet Take 1 tablet (5 mg total) by mouth daily. 90 tablet 1  . apixaban (ELIQUIS) 5 MG TABS tablet Take 1 tablet (5 mg total) by mouth 2 (two) times daily. 60 tablet 6  . Ascorbic Acid (VITAMIN C) 100 MG tablet Take 100 mg by mouth daily.      Marland Kitchen aspirin 81 MG tablet Take 81 mg by mouth daily.    Marland Kitchen atorvastatin (LIPITOR) 40 MG tablet Take 1 tablet (40 mg total) by mouth daily. 90 tablet 3  . benazepril (LOTENSIN) 20 MG tablet Take 1 tablet (20 mg total) by mouth daily. 90 tablet 1  .  cholecalciferol (VITAMIN D) 1000 UNITS tablet Take 1,000 Units by mouth daily.    . fish oil-omega-3 fatty acids 1000 MG capsule Take 1,000 mg by mouth daily.     Marland Kitchen latanoprost (XALATAN) 0.005 % ophthalmic solution Place 0.005 drops into both eyes every evening.    Marland Kitchen levothyroxine (LEVOTHROID) 25 MCG tablet Take 1 tablet (25 mcg total) by mouth daily before breakfast. 90 tablet 0  . metoprolol succinate (TOPROL XL) 25 MG 24 hr tablet Take 1 tablet (25 mg total) by mouth daily. 30 tablet 6  . Multiple Vitamin (MULTIVITAMIN) tablet Take 1 tablet by mouth daily.      . vitamin E 100 UNIT capsule Take 100 Units by mouth daily.       No current facility-administered medications for this visit.    Allergies  Allergen Reactions  . Cilostazol      Pletal :" head felt funny"    History   Social History  . Marital Status: Married     Spouse Name: Fayrene Fearing  . Number of Children: 1  . Years of Education: college   Occupational History  . Teacher     Retired   Social History Main Topics  . Smoking status: Former Smoker    Quit date: 03/11/1990  . Smokeless tobacco: Never Used     Comment: smoked 1973- 1992, up to < 5 cigarettes  . Alcohol Use: No  . Drug Use: No  . Sexual Activity: Not on file   Other Topics Concern  . 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 08/31/09- designated party form signed appointing, husband Charley Lafrance; ok to leave msg on home phone [phone].      Caffeine Small Amount one cup very rare.   Right handed.   One child- [name]    Family History  Problem Relation Age of Onset  . Diabetes Mother   . Hypertension Mother   . Stroke Brother     ?> 55  . Coronary artery disease Brother     stent in 62s  . Cancer Neg Hx     Review of Systems:  As stated in the HPI and otherwise negative.   BP 140/70 mmHg  Pulse 90  Ht  (1.676 m)  Wt 218 lb 3.2 oz (98.975 kg)  BMI 35.24 kg/m2  Physical Examination: General: Well developed, well nourished, NAD HEENT: OP clear, mucus membranes moist SKIN: warm, dry. No rashes. Neuro: No focal deficits Musculoskeletal: Muscle strength 5/5 all ext Psychiatric: Mood and affect normal Neck: No JVD, no carotid bruits, no thyromegaly, no lymphadenopathy. Lungs:Clear bilaterally, no wheezes, rhonci, crackles Cardiovascular: Regular rate and rhythm. No murmurs, gallops or rubs. Abdomen:Soft. Bowel sounds present. Non-tender.  Extremities: No lower extremity edema. Pulses are non-palpable in the bilateral DP/PT.  Echo 12/28/12: Left ventricle: Mid ventricular LV band but no obvious mural apical thrombus. Consider MRI/ or contrast study to further assess if clinically indicated as endocardial definition on TTE is poor The cavity size was mildly dilated.  Wall thickness was increased in a pattern of mild LVH. Systolic function was moderately reduced. The estimated ejection fraction was in the range of 35% to 40%. Diffuse hypokinesis. - Mitral valve: Calcified annulus. Mildly thickened leaflets . Mild regurgitation. - Left atrium: The atrium was mildly dilated. - Atrial septum: No defect or patent foramen ovale was identified. - Pulmonary arteries: PA peak pressure: 37mm Hg (S).  EKG:  EKG is ordered today. The ekg ordered today demonstrates Atrial fibrillation, rate 90 bpm. Poor R wave progression precordial leads  Recent Labs: 05/09/2014: ALT 19; BUN 13; Creatinine 1.17; Hemoglobin 13.3; Platelets 288.0; Potassium 4.0; Sodium 143; TSH 19.62*   Lipid Panel    Component Value Date/Time   CHOL 138 05/09/2014 0930   TRIG 71.0 05/09/2014 0930   HDL 44.30 05/09/2014 0930   CHOLHDL 3 05/09/2014 0930   VLDL 14.2 05/09/2014 0930   LDLCALC 80 05/09/2014 0930     Wt Readings from Last 3 Encounters:  06/21/14 218 lb 3.2 oz (98.975 kg)  05/23/14 220 lb 6.4 oz (99.973 kg)  05/06/14 224 lb 8 oz (101.833 kg)     Other studies Reviewed: Additional studies/ records that were reviewed today include: . Review of the above records demonstrates:   Assessment and Plan:   1. CAD: Stable. No recent chest pains. Will stop ASA as she is on Eliquis and somewhat higher risk of bleeding with prior intracranial disease.     2. PAD: ABI have been stable, last checked April 2015.  Right ABI 0.45, Left ABI 0.59.  She is clinically stable.   3. Ischemic cardiomyopathy: Last LVEF=40% by echo October 2014. Continue Ace-inh.   4. Carotid artery disease: Mild bilateral disease by dopplers July 2015. Repeat July 2017.   5. HTN: BP is well controlled.  No changes  6. Atrial fibrillation: She is rate controlled on Toprol. She is anti-coagulated with Eliquis. We have discussed the risks of CVA. I have once again reviewed her prior brain MRI. She is noted to  have multiple areas of possible old infarcts. She understands the risk of intracranial hemorrhage while on anti-coagulation but certainly her risk of embolic CVA is also very high off of anti-coagulation. Her CHADSVASC score is 6 (9.7% risk of CVA per year).   Current medicines are reviewed at length with the patient today.  The patient does not have concerns regarding medicines.  The following changes have been made:  Stop ASA  Labs/ tests ordered today include:  No orders of the defined types were placed in this encounter.    Disposition:   FU with me in 6 months  Signed, [name], MD 06/21/2014 3:03 PM    [facility] [address] [place], [place], [place]  [address] Phone: [phone]; Fax: [phone]

## 2014-06-21 NOTE — Patient Instructions (Signed)
Medication Instructions:  Stop Aspirin  Labwork: none  Testing/Procedures: none  Follow-Up: Your physician wants you to follow-up in: 6 months. You will receive a reminder letter in the mail two months in advance. If you don't receive a letter, please call our office to schedule the follow-up appointment.

## 2014-06-28 DIAGNOSIS — H4011X4 Primary open-angle glaucoma, indeterminate stage: Secondary | ICD-10-CM | POA: Diagnosis not present

## 2014-07-25 ENCOUNTER — Other Ambulatory Visit: Payer: Self-pay | Admitting: *Deleted

## 2014-07-25 ENCOUNTER — Telehealth: Payer: Self-pay | Admitting: Cardiovascular Disease

## 2014-07-25 DIAGNOSIS — I4819 Other persistent atrial fibrillation: Secondary | ICD-10-CM

## 2014-07-25 MED ORDER — APIXABAN 5 MG PO TABS: 5.0000 mg | ORAL_TABLET | Freq: Two times a day (BID) | ORAL | Status: DC

## 2014-07-25 NOTE — Telephone Encounter (Signed)
New Message  Onalee Hua- [facility] wants to clarify an Eliquis prescription (5mg ). Please call back andd sicuss,

## 2014-07-25 NOTE — Telephone Encounter (Signed)
David from [facility] called to clarify Eliquis rx. Rx they rec'd had quantity of #3 tablets and no refills. Verified Rx and will resend prescription with correct quantity/refill information. Completed.

## 2014-08-01 DIAGNOSIS — H40051 Ocular hypertension, right eye: Secondary | ICD-10-CM | POA: Diagnosis not present

## 2014-08-01 DIAGNOSIS — H40052 Ocular hypertension, left eye: Secondary | ICD-10-CM | POA: Diagnosis not present

## 2014-08-01 DIAGNOSIS — H4011X4 Primary open-angle glaucoma, indeterminate stage: Secondary | ICD-10-CM | POA: Diagnosis not present

## 2014-08-16 ENCOUNTER — Other Ambulatory Visit: Payer: Self-pay | Admitting: Internal Medicine

## 2014-08-30 ENCOUNTER — Telehealth: Payer: Self-pay | Admitting: Internal Medicine

## 2014-08-30 NOTE — Telephone Encounter (Signed)
OK 

## 2014-08-30 NOTE — Telephone Encounter (Signed)
Please advise 

## 2014-08-30 NOTE — Telephone Encounter (Signed)
Sherryl from [employer] called stated that patient need a insurance referral,  Dr Earlene Plater  Npi [id number] Dx code [id number] time June 23, Phone # [phone] Ex [phone], Fax # [phone]

## 2014-09-01 DIAGNOSIS — H40052 Ocular hypertension, left eye: Secondary | ICD-10-CM | POA: Diagnosis not present

## 2014-09-01 DIAGNOSIS — H40051 Ocular hypertension, right eye: Secondary | ICD-10-CM | POA: Diagnosis not present

## 2014-09-01 DIAGNOSIS — H4011X4 Primary open-angle glaucoma, indeterminate stage: Secondary | ICD-10-CM | POA: Diagnosis not present

## 2014-09-02 NOTE — Telephone Encounter (Signed)
There is already a referral on file for this patient to see Dr. Earlene Plater. Francine Graven Berkley Harvey  #[id number] valid 05/18/14-11/14/14 for 6 visits

## 2014-10-11 ENCOUNTER — Other Ambulatory Visit (INDEPENDENT_AMBULATORY_CARE_PROVIDER_SITE_OTHER): Payer: Commercial Managed Care - HMO

## 2014-10-11 ENCOUNTER — Ambulatory Visit (INDEPENDENT_AMBULATORY_CARE_PROVIDER_SITE_OTHER): Payer: Commercial Managed Care - HMO | Admitting: Internal Medicine

## 2014-10-11 ENCOUNTER — Encounter: Payer: Self-pay | Admitting: Internal Medicine

## 2014-10-11 ENCOUNTER — Other Ambulatory Visit: Payer: Self-pay | Admitting: Internal Medicine

## 2014-10-11 VITALS — BP 132/84 | HR 84 | Temp 97.4°F | Resp 18 | Wt 223.0 lb

## 2014-10-11 DIAGNOSIS — I10 Essential (primary) hypertension: Secondary | ICD-10-CM | POA: Diagnosis not present

## 2014-10-11 DIAGNOSIS — R350 Frequency of micturition: Secondary | ICD-10-CM

## 2014-10-11 DIAGNOSIS — E038 Other specified hypothyroidism: Secondary | ICD-10-CM

## 2014-10-11 DIAGNOSIS — R609 Edema, unspecified: Secondary | ICD-10-CM

## 2014-10-11 DIAGNOSIS — R351 Nocturia: Secondary | ICD-10-CM

## 2014-10-11 LAB — URINALYSIS, ROUTINE W REFLEX MICROSCOPIC
Bilirubin Urine: NEGATIVE
Ketones, ur: NEGATIVE
Nitrite: NEGATIVE
RBC / HPF: NONE SEEN (ref 0–?)
Specific Gravity, Urine: 1.025 (ref 1.000–1.030)
Total Protein, Urine: NEGATIVE
Urine Glucose: NEGATIVE
Urobilinogen, UA: 1 (ref 0.0–1.0)
pH: 6 (ref 5.0–8.0)

## 2014-10-11 LAB — HEPATIC FUNCTION PANEL
ALT: 13 U/L (ref 0–35)
AST: 16 U/L (ref 0–37)
Albumin: 4.1 g/dL (ref 3.5–5.2)
Alkaline Phosphatase: 109 U/L (ref 39–117)
Bilirubin, Direct: 0.1 mg/dL (ref 0.0–0.3)
Total Bilirubin: 0.6 mg/dL (ref 0.2–1.2)
Total Protein: 7.1 g/dL (ref 6.0–8.3)

## 2014-10-11 LAB — BASIC METABOLIC PANEL
BUN: 14 mg/dL (ref 6–23)
CO2: 30 mEq/L (ref 19–32)
Calcium: 9 mg/dL (ref 8.4–10.5)
Chloride: 105 mEq/L (ref 96–112)
Creatinine, Ser: 0.97 mg/dL (ref 0.40–1.20)
GFR: 72.15 mL/min (ref >60.00)
Glucose, Bld: 82 mg/dL (ref 70–99)
Potassium: 3.6 mEq/L (ref 3.5–5.1)
Sodium: 140 mEq/L (ref 135–145)

## 2014-10-11 LAB — TSH: TSH: 29.8 u[IU]/mL — ABNORMAL HIGH (ref 0.35–4.50)

## 2014-10-11 MED ORDER — LEVOTHYROXINE SODIUM 25 MCG PO TABS: 25.0000 ug | ORAL_TABLET | Freq: Every day | ORAL | Status: DC

## 2014-10-11 MED ORDER — HYDROCHLOROTHIAZIDE 12.5 MG PO CAPS: 12.5000 mg | ORAL_CAPSULE | Freq: Every day | ORAL | Status: DC

## 2014-10-11 NOTE — Progress Notes (Signed)
Pre visit review using our clinic review tool, if applicable. No additional management support is needed unless otherwise documented below in the visit note. 

## 2014-10-11 NOTE — Progress Notes (Signed)
   Subjective:    Patient ID: Yvonne Bailey, female    DOB: [birth year], 74 y.o.   MRN: [id number]  HPI  She describes edema of lower extremities for 1-2 months. There was no specific trigger such as prolonged travel or increased salt intake.    She is on amlodipine.    Atrial fibrillation has prompted initiation of Eliquis by Dr. Deno Lunger.   Other than edema she has no other cardiopulmonary symptoms   She does have frequency and nocturia at least 5 times a night; she relates this to incomplete bladder emptying.   Review of the chart reveals that her TSH was 19.62 on 05/09/14. There's been no follow-up.   Review of Systems  Chest pain, palpitations, tachycardia, exertional dyspnea, paroxysmal nocturnal dyspnea, or claudication are absent. No unexplained weight loss, abdominal pain, significant dyspepsia, dysphagia, melena, rectal bleeding, or persistently small caliber stools. Dysuria, pyuria, hematuria, or polyuria are denied. Change in hair, skin, nails denied. No bowel changes of constipation or diarrhea. No intolerance to heat or cold.      Objective:   Physical Exam  Pertinent or positive findings include: Upper plate present.Heart rhythm is irregular. She has tense nonpitting edema of the lower extremities. Pedal pulses are decreased, especially posterior tibial pulses. She has hyperpigmented, irregular scar tissue of the left ankle. Her gait is broad and unsteady. She uses a cane. Deep tendon reflexes 0-1/2+ at the knees.    General appearance :adequately nourished; in no distress.  Eyes: No conjunctival inflammation or scleral icterus is present.    No neck vein distention at 10 degrees.  Oral exam:  Lips and gums are healthy appearing.There is no oropharyngeal erythema or exudate noted.  Heart:  No gallop, murmur, click, rub or other extra sounds    Lungs:Chest clear to auscultation; no wheezes, rhonchi,rales ,or rubs present.No increased work of breathing.    Abdomen: bowel sounds normal, soft and non-tender without masses, organomegaly or hernias noted.  No guarding or rebound. No  Hepatojugular reflux.  Vascular : all pulses equal ; no bruits present.  Skin:Warm & dry.  Intact without suspicious lesions or rashes ; no tenting    Lymphatic: No lymphadenopathy is noted about the head, neck, axilla  Neuro: Strength, tone  decreased.        Assessment & Plan:  #1 edema ; on Amlodipine  #2 AF  #3 nocturia  See orders

## 2014-10-11 NOTE — Patient Instructions (Signed)
  Your next office appointment will be determined based upon review of your pending labs  and  xrays  Those written interpretation of the lab results and instructions will be transmitted to you by mail for your records.  Critical results will be called.   Followup as needed for any active or acute issue. Please report any significant change in your symptoms. 

## 2014-10-12 ENCOUNTER — Other Ambulatory Visit: Payer: Commercial Managed Care - HMO

## 2014-10-12 ENCOUNTER — Telehealth: Payer: Self-pay | Admitting: Emergency Medicine

## 2014-10-12 DIAGNOSIS — R351 Nocturia: Secondary | ICD-10-CM

## 2014-10-12 NOTE — Telephone Encounter (Signed)
Urine culture added

## 2014-10-13 LAB — URINE CULTURE: Colony Count: 25000

## 2014-10-24 ENCOUNTER — Telehealth: Payer: Self-pay | Admitting: Internal Medicine

## 2014-10-24 DIAGNOSIS — R351 Nocturia: Secondary | ICD-10-CM

## 2014-10-24 NOTE — Telephone Encounter (Signed)
Informed pt that referral has been placed for Urology.

## 2014-10-24 NOTE — Telephone Encounter (Signed)
Patient is calling regarding the below lab results. She does not understand the plan of care from this point. Please advise her.   Notes Recorded by Gypsy Lore, CMA on 10/14/2014 at 2:16 PM Results mailed to pt. Notes Recorded by Pecola Lawless, MD on 10/14/2014 at 1:10 PM Criteria for a significant Urinary Tract Infection (UTI) are: over 100,000 colonies of a single, not multiple bacteria. No UTI present. You should have results of all urine cultures to verify whether you are truly having UTIs.  Symptoms can also be caused by a non infectious process such as interstitial cystitis for example. A Urology referral is recommended for recurrent symptoms if cultures are negative as antibiotics taken for non infectious processes would have adverse effects & no benefit .

## 2014-10-24 NOTE — Telephone Encounter (Signed)
Spoke with to clarify lab results for UA. Pt stated that she is still having symptoms. Per Hopp in result notes, a Urology Referral will be placed if pt is still having symptoms. Pt stated that she is still having symptoms and would like the referral to be placed. She is leaving to go out of town at the end of the month and would like for this to be done before then. Please advise.

## 2014-10-26 ENCOUNTER — Encounter: Payer: Self-pay | Admitting: Family

## 2014-10-26 DIAGNOSIS — H4011X2 Primary open-angle glaucoma, moderate stage: Secondary | ICD-10-CM | POA: Diagnosis not present

## 2014-10-26 DIAGNOSIS — H4011X3 Primary open-angle glaucoma, severe stage: Secondary | ICD-10-CM | POA: Diagnosis not present

## 2014-10-27 ENCOUNTER — Telehealth: Payer: Self-pay | Admitting: Emergency Medicine

## 2014-10-27 NOTE — Telephone Encounter (Signed)
Spoke with pt to follow-up on Thyroid medication. Informed her to come in around the 1st week of October to have her levels rechecked after being on the medication for 10 weeks. Pt stated that she does not have the password for MyChart, results are being mailed from her last labs. Pt would like new password to MyChart at next visit.

## 2014-11-30 ENCOUNTER — Telehealth: Payer: Self-pay

## 2014-11-30 NOTE — Telephone Encounter (Signed)
Patient called to educate on Medicare Wellness apt. LVM for the patient to call back to educate and schedule for wellness visit.   

## 2014-12-06 NOTE — Telephone Encounter (Signed)
Call to fup on AWV; Agreed to come in next Thursday on 10/6 at 10am. Apt scheduled

## 2014-12-15 ENCOUNTER — Ambulatory Visit (INDEPENDENT_AMBULATORY_CARE_PROVIDER_SITE_OTHER): Payer: Commercial Managed Care - HMO

## 2014-12-15 ENCOUNTER — Other Ambulatory Visit (INDEPENDENT_AMBULATORY_CARE_PROVIDER_SITE_OTHER): Payer: Self-pay

## 2014-12-15 DIAGNOSIS — Z0000 Encounter for general adult medical examination without abnormal findings: Secondary | ICD-10-CM | POA: Diagnosis not present

## 2014-12-15 DIAGNOSIS — Z23 Encounter for immunization: Secondary | ICD-10-CM | POA: Diagnosis not present

## 2014-12-15 DIAGNOSIS — E038 Other specified hypothyroidism: Secondary | ICD-10-CM

## 2014-12-15 LAB — TSH: TSH: 16.3 u[IU]/mL — ABNORMAL HIGH (ref 0.35–4.50)

## 2014-12-15 NOTE — Patient Instructions (Addendum)
Yvonne Bailey , Thank you for taking time to come for your Medicare Wellness Visit. I appreciate your ongoing commitment to your health goals. Please review the following plan we discussed and let me know if I can assist you in the future.   Educated to check with insurance regarding coverage of Shingles vaccination on Part D or Part B and may have lower co-pay if provided on the Part D side  Will take Prevnar 13 today  Declines flu shot  Will consider the tetanus vaccine in the future  Referral to [facility] program  [facility]  Address: [address], [place], [place] [address]  Phone: [phone]  Goals is to lose weight;  [place] [address] [place], [place] [address] Phone: [phone] / [phone] Video Phone: [phone] TTY: [phone] /  Fax: [phone]: [place], [place], [place], [place], [place], [place], [place], [place], Adline Potter and [facility] Staff  If questions, please call Manuela Schwartz [phone]   These are the goals we discussed: Goals    None      This is a list of the screening recommended for you and due dates:  Health Maintenance  Topic Date Due  . Tetanus Vaccine  07/10/1972  . Shingles Vaccine  07/10/1972  . DEXA scan (bone density measurement)  07/10/1972  . Pneumonia vaccines (2 of 2 - PCV13) 08/31/1972  . Mammogram  03/23/1972  . Flu Shot  10/09/1972  . Colon Cancer Screening  12/22/1972     Health Maintenance, Female Adopting a healthy lifestyle and getting preventive care can go a long way to promote health and wellness. Talk with your health care provider about what schedule of regular examinations is right for you. This is a good chance for you to check in with your provider about disease prevention and staying healthy. In between checkups, there are plenty of things you can do on  your own. Experts have done a lot of research about which lifestyle changes and preventive measures are most likely to keep you healthy. Ask your health care provider for more information. WEIGHT AND DIET  Eat a healthy diet  Be sure to include plenty of vegetables, fruits, low-fat dairy products, and lean protein.  Do not eat a lot of foods high in solid fats, added sugars, or salt.  Get regular exercise. This is one of the most important things you can do for your health.  Most adults should exercise for at least 150 minutes each week. The exercise should increase your heart rate and make you sweat (moderate-intensity exercise).  Most adults should also do strengthening exercises at least twice a week. This is in addition to the moderate-intensity exercise.  Maintain a healthy weight  Body mass index (BMI) is a measurement that can be used to identify possible weight problems. It estimates body fat based on height and weight. Your health care provider can help determine your BMI and help you achieve or maintain a healthy weight.  For females 45 years of age and older:   A BMI below 18.5 is considered underweight.  A BMI of 18.5 to 24.9 is normal.  A BMI of 25 to 29.9 is considered overweight.  A BMI of 30 and above is considered obese.  Watch levels of cholesterol and blood lipids  You should start having your blood tested for lipids and cholesterol at 74 years of age,  then have this test every 5 years.  You may need to have your cholesterol levels checked more often if:  Your lipid or cholesterol levels are high.  You are older than 74 years of age.  You are at high risk for heart disease.  CANCER SCREENING   Lung Cancer  Lung cancer screening is recommended for adults 74-97 years old who are at high risk for lung cancer because of a history of smoking.  A yearly low-dose CT scan of the lungs is recommended for people who:  Currently smoke.  Have quit within the  past 15 years.  Have at least a 30-pack-year history of smoking. A pack year is smoking an average of one pack of cigarettes a day for 1 year.  Yearly screening should continue until it has been 15 years since you quit.  Yearly screening should stop if you develop a health problem that would prevent you from having lung cancer treatment.  Breast Cancer  Practice breast self-awareness. This means understanding how your breasts normally appear and feel.  It also means doing regular breast self-exams. Let your health care provider know about any changes, no matter how small.  If you are in your 20s or 74s, you should have a clinical breast exam (CBE) by a health care provider every 1-3 years as part of a regular health exam by a health care provider every 1-3 years as part of a regular health exam.  If you are 74 or older, have a CBE every year. Also consider having a breast X-ray (mammogram) every year.  If you have a family history of breast cancer, talk to your health care provider about genetic screening.  If you are at high risk for breast cancer, talk to your health care provider about having an MRI and a mammogram every year.  Breast cancer gene (BRCA) assessment is recommended for women who have family members with BRCA-related cancers. BRCA-related cancers include:  Breast.  Ovarian.  Tubal.  Peritoneal cancers.  Results of the assessment will determine the need for genetic counseling and BRCA1 and BRCA2 testing. Cervical Cancer Your health care provider may recommend that you be screened regularly for cancer of the pelvic organs (ovaries, uterus, and vagina). This screening involves a pelvic examination, including checking for microscopic changes to the surface of your cervix (Pap test). You may be encouraged to have this screening done every 3 years, beginning at age 74.  For women ages 74-65, health care providers may recommend pelvic exams and Pap testing every 3 years, or they may recommend the Pap and pelvic exam, combined with testing for  human papilloma virus (HPV), every 5 years. Some types of HPV increase your risk of cervical cancer. Testing for HPV may also be done on women of any age with unclear Pap test results.  Other health care providers may not recommend any screening for nonpregnant women who are considered low risk for pelvic cancer and who do not have symptoms. Ask your health care provider if a screening pelvic exam is right for you.  If you have had past treatment for cervical cancer or a condition that could lead to cancer, you need Pap tests and screening for cancer for at least 20 years after your treatment. If Pap tests have been discontinued, your risk factors (such as having a new sexual partner) need to be reassessed to determine if screening should resume. Some women have medical problems that increase the chance of getting cervical cancer. In these cases, your health care provider may recommend more frequent screening and Pap tests. Colorectal Cancer  This type of cancer can be detected and often prevented.  Routine colorectal cancer screening usually begins at 74 years of age and continues through 74 years of age.  Your health care provider may recommend screening at an earlier age if you have risk factors for colon cancer.  Your health care provider may also recommend using home test kits to check for hidden blood in the stool.  A small camera at the end of a tube can be used to examine your colon directly (sigmoidoscopy or colonoscopy). This is done to check for the earliest forms of colorectal cancer.  Routine screening usually begins at age 62.  Direct examination of the colon should be repeated every 5-10 years through 74 years of age. However, you may need to be screened more often if early forms of precancerous polyps or small growths are found. Skin Cancer  Check your skin from head to toe regularly.  Tell your health care provider about any new moles or changes in moles, especially if there  is a change in a mole's shape or color.  Also tell your health care provider if you have a mole that is larger than the size of a pencil eraser.  Always use sunscreen. Apply sunscreen liberally and repeatedly throughout the day.  Protect yourself by wearing long sleeves, pants, a wide-brimmed hat, and sunglasses whenever you are outside. HEART DISEASE, DIABETES, AND HIGH BLOOD PRESSURE   High blood pressure causes heart disease and increases the risk of stroke. High blood pressure is more likely to develop in:  People who have blood pressure in the high end of the normal range (130-139/85-89 mm Hg).  People who are overweight or obese.  People who are African American.  If you are 57-32 years of age, have your blood pressure checked every 3-5 years. If you are 16 years of age or older, have your blood pressure checked every year. You should have your blood pressure measured twice--once when you are at a hospital or clinic, and once when you are not at a hospital or clinic. Record the average of the two measurements. To check your blood pressure when you are not at a hospital or clinic, you can use:  An automated blood pressure machine at a pharmacy.  A home blood pressure monitor.  If you are between 106 years and 63 years old, ask your health care provider if you should take aspirin to prevent strokes.  Have regular diabetes screenings. This involves taking a blood sample to check your fasting blood sugar level.  If you are at a normal weight and have a low risk for diabetes, have this test once every three years after 75 years of age.  If you are overweight and have a high risk for diabetes, consider being tested at a younger age or more often. PREVENTING INFECTION  Hepatitis B  If you have a higher risk for hepatitis B, you should be screened for this virus. You are considered at high risk for hepatitis B if:  You were born in a country where hepatitis B is common. Ask your  health care provider which countries are considered high risk.  Your parents were born in a high-risk country, and you have not been immunized against hepatitis B (hepatitis B vaccine).  You have HIV or AIDS.  You use needles to inject street drugs.  You live with someone who has hepatitis B.  You have had sex with someone who has hepatitis B.  You get hemodialysis  treatment.  You take certain medicines for conditions, including cancer, organ transplantation, and autoimmune conditions. Hepatitis C  Blood testing is recommended for:  Everyone born from [birth year] through 1965.  Anyone with known risk factors for hepatitis C. Sexually transmitted infections (STIs)  You should be screened for sexually transmitted infections (STIs) including gonorrhea and chlamydia if:  You are sexually active and are younger than 74 years of age.  You are older than 74 years of age and your health care provider tells you that you are at risk for this type of infection.  Your sexual activity has changed since you were last screened and you are at an increased risk for chlamydia or gonorrhea. Ask your health care provider if you are at risk.  If you do not have HIV, but are at risk, it may be recommended that you take a prescription medicine daily to prevent HIV infection. This is called pre-exposure prophylaxis (PrEP). You are considered at risk if:  You are sexually active and do not regularly use condoms or know the HIV status of your partner(s).  You take drugs by injection.  You are sexually active with a partner who has HIV. Talk with your health care provider about whether you are at high risk of being infected with HIV. If you choose to begin PrEP, you should first be tested for HIV. You should then be tested every 3 months for as long as you are taking PrEP.  PREGNANCY   If you are premenopausal and you may become pregnant, ask your health care provider about preconception counseling.  If  you may become pregnant, take 400 to 800 micrograms (mcg) of folic acid every day.  If you want to prevent pregnancy, talk to your health care provider about birth control (contraception). OSTEOPOROSIS AND MENOPAUSE   Osteoporosis is a disease in which the bones lose minerals and strength with aging. This can result in serious bone fractures. Your risk for osteoporosis can be identified using a bone density scan.  If you are 26 years of age or older, or if you are at risk for osteoporosis and fractures, ask your health care provider if you should be screened.  Ask your health care provider whether you should take a calcium or vitamin D supplement to lower your risk for osteoporosis.  Menopause may have certain physical symptoms and risks.  Hormone replacement therapy may reduce some of these symptoms and risks. Talk to your health care provider about whether hormone replacement therapy is right for you.  HOME CARE INSTRUCTIONS   Schedule regular health, dental, and eye exams.  Stay current with your immunizations.   Do not use any tobacco products including cigarettes, chewing tobacco, or electronic cigarettes.  If you are pregnant, do not drink alcohol.  If you are breastfeeding, limit how much and how often you drink alcohol.  Limit alcohol intake to no more than 1 drink per day for nonpregnant women. One drink equals 12 ounces of beer, 5 ounces of wine, or 1 ounces of hard liquor.  Do not use street drugs.  Do not share needles.  Ask your health care provider for help if you need support or information about quitting drugs.  Tell your health care provider if you often feel depressed.  Tell your health care provider if you have ever been abused or do not feel safe at home.   This information is not intended to replace advice given to you by your health care provider. Make sure you  discuss any questions you have with your health care provider.   Document Released:  09/10/2010 Document Revised: 03/18/2014 Document Reviewed: 01/27/2013 Elsevier Interactive Patient Education [employer].

## 2014-12-15 NOTE — Progress Notes (Signed)
Subjective:   Yvonne Bailey is a 74 y.o. female who presents for Medicare Annual (Subsequent) preventive examination.  Review of Systems:   Cardiac Risk Factors include: advanced age (>30men, >25 women);dyslipidemia HRA assessment completed during visit; [place] The Patient was informed that this wellness visit is to identify risk and educate on how to reduce risk for increase disease through lifestyle changes.   ROS deferred to CPE exam with physician  Medical issues under medical treatment;  HTN; atherosclerosis; stroke; hyperlipidemia/ reviewed lipids HAD mi 92; TIA's 2012; Has been healthier since;   BMI: 35.5; has lost weight before;   Diet; seldom eat breakfast; eat lunch; Eat frozen dinner; Deli meat; eats a lot of chicken;  Don't cook much; Goes out every Sunday; dinner chicken and spaghetti  Eats a lot of beef    Exercise; silver sneakers x 2 per week   SAFETY; home is one level  Safety reviewed for the home; up at hs to void; 3 to 4 times and is being evaluated by urologist;  clear paths through the home, railing as needed-not at this time;  bathroom has shower and she does not get in tub; safety; community safety; smoke detectors and firearms safety as well as sun protection;  Driving accidents; no accidents; and seatbelt- yes Sun protection; very seldom Stressors; grand dtr stays with her right now; she goes to [facility];   Medication review completed  Fall assessment; fell early summer in a parking lot because the surface was unlevel; no injury Gait assessment: Mobility is impaired and walks with cane; uses cane on the right with left being painful   Mobilization and Functional losses in the last year. none Sleep patterns would be better if she did not have void   Urinary or fecal incontinence reviewed/ no but voiding a lot at hs; states she has an apt with urologist this week    Counseling: Colonoscopy; 12/2006 - will repeat at 75 / will discuss colo  guard with Dr. Juanda Chance or PCP  EKG 06/2014 Dexa scan; normal at all sites in Jan 2012; declines at this time Mammogram due 03/2014  Hearing: 2000 in right ear; left ear could not hear tone; hearing test completed Had hearing tested and wore hearing aid to test; it did help; seemed like it was too loud;  Ophthalmology exam; Apt on Friday; Dr. Elmer Picker; checks pressure often; Dr. Alben Spittle; vision good at present  Immunizations Due  Tetanus educated on need; will consider at a later time Zoster/ Educated to check with insurance regarding coverage of Shingles vaccination on Part D or Part B and may have lower co-pay if provided on the Part D side  PCV 13 will take this today Flu declines flu shot   Current Care Team reviewed and updated Earney Hamburg;      Objective:     Vitals: Ht 5' 6.5" (1.689 m)  Wt 223 lb 4 oz (101.266 kg)  BMI 35.50 kg/m2  Tobacco History  Smoking status  . Former Smoker  . Quit date: 03/11/1990  Smokeless tobacco  . Never Used    Comment: smoked 1973- 1992, up to < 5 cigarettes     Counseling given: Not Answered   Past Medical History  Diagnosis Date  . MI (myocardial infarction) 42    at age 67; TIA treated with coumadin until Plavix initiated in 2006  . Hyperlipidemia   . HTN (hypertension)   . Hypothyroidism   . DVT (deep venous thrombosis)  X1  . CAD (coronary artery disease)     S/P  anterior  wall myocardial infarction in '92, treated w/percutaneous transluminal coronary angioplasty of the left anterior descending. EF 45%  . PAD (peripheral artery disease)     Right SFA occlusion, severe disease left CFA and SFA  . Carotid artery disease    Past Surgical History  Procedure Laterality Date  . Fracture lle      '94; pinned  . Appendectomy      at hysterectomy and USO for fibroids, Dr. Kizzie Bane  . Tonsillectomy    . Colonoscopy      negative; 2008, Dr. Lina Sar  . Cardiac catheterization  1992    Dr Charlies Constable  . Total  abdominal hysterectomy      & BSO for fibroids   Family History  Problem Relation Age of Onset  . Diabetes Mother   . Hypertension Mother   . Stroke Brother     ?> 55  . Coronary artery disease Brother     stent in 25s  . Cancer Neg Hx    History  Sexual Activity  . Sexual Activity: Not on file    Outpatient Encounter Prescriptions as of 12/15/2014  Medication Sig  . apixaban (ELIQUIS) 5 MG TABS tablet Take 1 tablet (5 mg total) by mouth 2 (two) times daily.  . Ascorbic Acid (VITAMIN C) 100 MG tablet Take 100 mg by mouth daily.    Marland Kitchen atorvastatin (LIPITOR) 40 MG tablet Take 1 tablet (40 mg total) by mouth daily.  . benazepril (LOTENSIN) 20 MG tablet TAKE 1 TABLET (20 MG TOTAL) DAILY.  . cholecalciferol (VITAMIN D) 1000 UNITS tablet Take 1,000 Units by mouth daily.  . fish oil-omega-3 fatty acids 1000 MG capsule Take 1,000 mg by mouth daily.   . hydrochlorothiazide (MICROZIDE) 12.5 MG capsule Take 1 capsule (12.5 mg total) by mouth daily.  Marland Kitchen latanoprost (XALATAN) 0.005 % ophthalmic solution Place 0.005 drops into both eyes every evening.  Marland Kitchen levothyroxine (LEVOTHROID) 25 MCG tablet Take 1 tablet (25 mcg total) by mouth daily before breakfast.  . metoprolol succinate (TOPROL XL) 25 MG 24 hr tablet Take 1 tablet (25 mg total) by mouth daily.  . Multiple Vitamin (MULTIVITAMIN) tablet Take 1 tablet by mouth daily.    . vitamin E 100 UNIT capsule Take 100 Units by mouth daily.     No facility-administered encounter medications on file as of 12/15/2014.    Activities of Daily Living In your present state of health, do you have any difficulty performing the following activities: 12/15/2014  Hearing? Y  Vision? N  Difficulty concentrating or making decisions? N  Walking or climbing stairs? N  Dressing or bathing? N  Doing errands, shopping? N  Preparing Food and eating ? N  Using the Toilet? N  In the past six months, have you accidently leaked urine? Y  Do you have problems with  loss of bowel control? N  Managing your Medications? N  Managing your Finances? N  Housekeeping or managing your Housekeeping? N    Patient Care Team: Pecola Lawless, MD as PCP - General    Assessment:    Assessment   Patient presents for yearly preventative medicine examination. Medicare questionnaire screening were completed, i.e. Functional; fall risk; depression, memory loss and hearing. Hearing loss in left ear; tried hearing aid x 3 days but stated the noise was loud; referred to [facility] for assistance with hearing aids if needed in the  future  All immunizations and health maintenance protocols were reviewed with the patient and needed orders were placed.Declined TD and flu; took prevnar today  Education provided for laboratory screens;    Medication reconciliation, past medical history, social history, problem list and allergies were reviewed in detail with the patient  Goals were established with regard to weight loss, exercise, and diet in compliance with medications based on the patient individualized risk;  [facility]erred to [facility] as she stated she really wants to lose weight but not sure where to start.  End of life planning was discussed. Took information;   Of significance; lost spouse this past year;    Exercise Activities and Dietary recommendations Current Exercise Habits:: Structured exercise class, Type of exercise: strength training/weights, Time (Minutes): 60, Frequency (Times/Week): 2, Weekly Exercise (Minutes/Week): 120  Goals    . Weight < 200 lb (90.719 kg)      Fall Risk Fall Risk  12/15/2014 05/14/2013  Falls in the past year? Yes No  Number falls in past yr: 1 -  Injury with Fall? No -  Follow up Education provided -   Depression Screen PHQ 2/9 Scores 12/15/2014 05/14/2013  PHQ - 2 Score 0 0     Cognitive Testing No flowsheet data found.   AD8 Score 0;   Immunization History  Administered Date(s) Administered  . Pneumococcal  Conjugate-13 12/15/2014  . Pneumococcal Polysaccharide-23 08/31/2009   Screening Tests Health Maintenance  Topic Date Due  . TETANUS/TDAP  07/11/1959  . ZOSTAVAX  07/10/2000  . DEXA SCAN  07/10/2005  . MAMMOGRAM  03/23/2014  . INFLUENZA VACCINE  10/10/2014  . COLONOSCOPY  12/22/2016  . PNA vac Low Risk Adult  Completed      Plan:   Will consider shingles; will check for insurance coverage Took prevnar today Declines flu Discussed TD and declined today Referred to [facility] for assistance with weight loss  Given the number for [place] division of HOH to check on assistance with hearing aid.    During the course of the visit the patient was educated and counseled about the following appropriate screening and preventive services:   Vaccines to include Pneumoccal, Influenza, Hepatitis B, Td, Zostavax, HCV   due Tetanus; and flu; rec'd prevnar  Electrocardiogram 06/2014  Cardiovascular Disease/ deferred to medical  Colorectal cancer screening/ discussed repeat in 2018; would like to consider the cologuard  Bone density screening; declines today  Diabetes screening/ 5.6 A1c  Glaucoma screening Dr. Elmer Picker checking pressures often; no visual loss at present  Mammography/PAP 03/2014  Nutrition counseling / referral   Patient Instructions (the written plan) was given to the patient.   Lamount Bankson, RN  12/15/2014

## 2014-12-15 NOTE — Progress Notes (Signed)
Medical screening examination/treatment/procedure(s) were performed by non-physician practitioner and as supervising physician I was immediately available for consultation/collaboration. I agree with above. Brooklinn Longbottom, MD   

## 2014-12-16 ENCOUNTER — Other Ambulatory Visit: Payer: Self-pay | Admitting: Internal Medicine

## 2014-12-16 DIAGNOSIS — H401112 Primary open-angle glaucoma, right eye, moderate stage: Secondary | ICD-10-CM | POA: Diagnosis not present

## 2014-12-16 DIAGNOSIS — H401123 Primary open-angle glaucoma, left eye, severe stage: Secondary | ICD-10-CM | POA: Diagnosis not present

## 2014-12-16 DIAGNOSIS — E038 Other specified hypothyroidism: Secondary | ICD-10-CM

## 2014-12-16 MED ORDER — LEVOTHYROXINE SODIUM 50 MCG PO TABS: 50.0000 ug | ORAL_TABLET | Freq: Every day | ORAL | Status: DC

## 2014-12-21 DIAGNOSIS — R3915 Urgency of urination: Secondary | ICD-10-CM | POA: Diagnosis not present

## 2015-02-06 ENCOUNTER — Other Ambulatory Visit: Payer: Self-pay | Admitting: Cardiovascular Disease

## 2015-02-09 ENCOUNTER — Other Ambulatory Visit: Payer: Self-pay

## 2015-02-09 DIAGNOSIS — I10 Essential (primary) hypertension: Secondary | ICD-10-CM

## 2015-02-09 MED ORDER — HYDROCHLOROTHIAZIDE 12.5 MG PO CAPS: 12.5000 mg | ORAL_CAPSULE | Freq: Every day | ORAL | Status: DC

## 2015-02-16 ENCOUNTER — Other Ambulatory Visit: Payer: Self-pay | Admitting: Emergency Medicine

## 2015-02-16 MED ORDER — LEVOTHYROXINE SODIUM 50 MCG PO TABS: 50.0000 ug | ORAL_TABLET | Freq: Every day | ORAL | Status: DC

## 2015-03-16 ENCOUNTER — Other Ambulatory Visit: Payer: Self-pay | Admitting: Cardiovascular Disease

## 2015-03-21 DIAGNOSIS — R3915 Urgency of urination: Secondary | ICD-10-CM | POA: Diagnosis not present

## 2015-03-22 ENCOUNTER — Encounter: Payer: Self-pay | Admitting: Cardiovascular Disease

## 2015-03-22 ENCOUNTER — Ambulatory Visit (INDEPENDENT_AMBULATORY_CARE_PROVIDER_SITE_OTHER): Payer: Commercial Managed Care - HMO | Admitting: Cardiovascular Disease

## 2015-03-22 VITALS — BP 130/86 | HR 78 | Ht 66.5 in | Wt 225.0 lb

## 2015-03-22 DIAGNOSIS — I739 Peripheral vascular disease, unspecified: Secondary | ICD-10-CM | POA: Diagnosis not present

## 2015-03-22 DIAGNOSIS — I10 Essential (primary) hypertension: Secondary | ICD-10-CM | POA: Diagnosis not present

## 2015-03-22 DIAGNOSIS — I481 Persistent atrial fibrillation: Secondary | ICD-10-CM

## 2015-03-22 DIAGNOSIS — I779 Disorder of arteries and arterioles, unspecified: Secondary | ICD-10-CM | POA: Diagnosis not present

## 2015-03-22 DIAGNOSIS — I255 Ischemic cardiomyopathy: Secondary | ICD-10-CM

## 2015-03-22 DIAGNOSIS — I4819 Other persistent atrial fibrillation: Secondary | ICD-10-CM

## 2015-03-22 DIAGNOSIS — I2510 Atherosclerotic heart disease of native coronary artery without angina pectoris: Secondary | ICD-10-CM

## 2015-03-22 NOTE — Patient Instructions (Signed)
Medication Instructions:  Your physician recommends that you continue on your current medications as directed. Please refer to the Current Medication list given to you today.   Labwork: none  Testing/Procedures: Your physician has requested that you have an ankle brachial index (ABI). During this test an ultrasound and blood pressure cuff are used to evaluate the arteries that supply the arms and legs with blood. Allow thirty minutes for this exam. There are no restrictions or special instructions.  Your physician has requested that you have a carotid duplex. This test is an ultrasound of the carotid arteries in your neck. It looks at blood flow through these arteries that supply the brain with blood. Allow one hour for this exam. There are no restrictions or special instructions.    Follow-Up: Your physician wants you to follow-up in: 12 months.  You will receive a reminder letter in the mail two months in advance. If you don't receive a letter, please call our office to schedule the follow-up appointment.   Any Other Special Instructions Will Be Listed Below (If Applicable).     If you need a refill on your cardiac medications before your next appointment, please call your pharmacy.

## 2015-03-22 NOTE — Progress Notes (Signed)
Chief Complaint  Patient presents with  . Follow-up    pt has no complaints today  . Coronary Artery Disease  . PAD   History of Present Illness: 75 yo female with history of CAD, HTN, PAD, carotid artery disease, hyperlipidemia here today for cardiac followup. She had been followed in the past by Dr. Juanda Chance. In 1992 she had an anterior MI treated with PCI. Her ejection fraction in 2007 was 45%. She also has carotid disease and had Doppler studies June 2014 which showed 0-39% stenosis bilaterally. I ordered bilateral lower ext dopplers July 2012 which showed long occlusion of the right SFA and severe stenosis left CFA and SFA with ABI of 0.5 on the right and 0.63 on the left. ABI April 2015 with 0.45 on right and 0.59 on the left. In April 204 she had weakness and has been followed by Neurology since then for CVA. She had increased lower extremity swelling on 10 mg of Norvasc so this was lowered to 5 mg daily. I saw her in the office 05/23/14 and she was in atrial fibrillation. Eliquis was started for anti-coagulation. Toprol was started for rate control.   She is here today for follow up. She tells me today that she only has leg pain if she walks long distances. No rest pain or ulcerations. Overall feeling very well. No chest pain or SOB. No awareness of palpitations. No bleeding.   Primary Care Physician: Cheryll Cockayne  Past Medical History  Diagnosis Date  . MI (myocardial infarction) (HCC) 1992    at age 86; TIA treated with coumadin until Plavix initiated in 2006  . Hyperlipidemia   . HTN (hypertension)   . Hypothyroidism   . DVT (deep venous thrombosis) (HCC)     X1  . CAD (coronary artery disease)     S/P  anterior  wall myocardial infarction in '92, treated w/percutaneous transluminal coronary angioplasty of the left anterior descending. EF 45%  . PAD (peripheral artery disease) (HCC)     Right SFA occlusion, severe disease left CFA and SFA  . Carotid artery disease [facility])      Past Surgical History  Procedure Laterality Date  . Fracture lle      '94; pinned  . Appendectomy      at hysterectomy and USO for fibroids, Dr. Kizzie Bane  . Tonsillectomy    . Colonoscopy      negative; 2008, Dr. Lina Sar  . Cardiac catheterization  1992    Dr Charlies Constable  . Total abdominal hysterectomy      & BSO for fibroids    Current Outpatient Prescriptions  Medication Sig Dispense Refill  . apixaban (ELIQUIS) 5 MG TABS tablet Take 1 tablet (5 mg total) by mouth 2 (two) times daily. 60 tablet 6  . Ascorbic Acid (VITAMIN C) 100 MG tablet Take 100 mg by mouth daily.      Marland Kitchen atorvastatin (LIPITOR) 40 MG tablet Take 1 tablet (40 mg total) by mouth daily. 90 tablet 0  . benazepril (LOTENSIN) 20 MG tablet TAKE 1 TABLET (20 MG TOTAL) DAILY. 90 tablet 2  . cholecalciferol (VITAMIN D) 1000 UNITS tablet Take 1,000 Units by mouth daily.    . fish oil-omega-3 fatty acids 1000 MG capsule Take 1,000 mg by mouth daily.     . hydrochlorothiazide (MICROZIDE) 12.5 MG capsule Take 1 capsule (12.5 mg total) by mouth daily. Must establish with NEW PCP for additional refills. 90 capsule 0  . latanoprost (XALATAN) 0.005 %  ophthalmic solution Place 0.005 drops into both eyes every evening.    Marland Kitchen levothyroxine (SYNTHROID, LEVOTHROID) 50 MCG tablet Take 1 tablet (50 mcg total) by mouth daily. 90 tablet 0  . metoprolol succinate (TOPROL-XL) 25 MG 24 hr tablet TAKE 1 TABLET(25 MG) BY MOUTH DAILY 30 tablet 3  . Multiple Vitamin (MULTIVITAMIN) tablet Take 1 tablet by mouth daily.      . vitamin E 100 UNIT capsule Take 100 Units by mouth daily.       No current facility-administered medications for this visit.    Allergies  Allergen Reactions  . Cilostazol Other (See Comments)     Pletal :" head felt funny"    Social History   Social History  . Marital Status: Married    Spouse Name: Fayrene Fearing  . Number of Children: 1  . Years of Education: college   Occupational History  . Teacher      Retired   Social History Main Topics  . Smoking status: Former Smoker    Quit date: 03/11/1990  . Smokeless tobacco: Never Used     Comment: smoked 1973- 1992, up to < 5 cigarettes  . Alcohol Use: No  . Drug Use: No  . Sexual Activity: Not on file   Other Topics Concern  . 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 08/31/09- designated party form signed appointing, husband Arielys Mathurin; ok to leave msg on home phone [phone].      Caffeine Small Amount one cup very rare.   Right handed.   One child- [name]    Family History  Problem Relation Age of Onset  . Diabetes Mother   . Hypertension Mother   . Stroke Brother     ?> 55  . Coronary artery disease Brother     stent in 73s  . Cancer Neg Hx     Review of Systems:  As stated in the HPI and otherwise negative.   BP 130/86 mmHg  Pulse 78  Ht 5' 6.5" (1.689 m)  Wt 225 lb (102.059 kg)  BMI 35.78 kg/m2  SpO2 98%  Physical Examination: General: Well developed, well nourished, NAD HEENT: OP clear, mucus membranes moist SKIN: warm, dry. No rashes. Neuro: No focal deficits Musculoskeletal: Muscle strength 5/5 all ext Psychiatric: Mood and affect normal Neck: No JVD, no carotid bruits, no thyromegaly, no lymphadenopathy. Lungs:Clear bilaterally, no wheezes, rhonci, crackles Cardiovascular: Regular rate and rhythm. No murmurs, gallops or rubs. Abdomen:Soft. Bowel sounds present. Non-tender.  Extremities: No lower extremity edema. Pulses are non-palpable in the bilateral DP/PT.  Echo 12/28/12: Left ventricle: Mid ventricular LV band but no obvious mural apical thrombus. Consider MRI/ or contrast study to further assess if clinically indicated as endocardial definition on TTE is poor The cavity size was mildly dilated. Wall thickness was increased in a pattern of mild LVH. Systolic function was moderately reduced. The estimated  ejection fraction was in the range of 35% to 40%. Diffuse hypokinesis. - Mitral valve: Calcified annulus. Mildly thickened leaflets . Mild regurgitation. - Left atrium: The atrium was mildly dilated. - Atrial septum: No defect or patent foramen ovale was identified. - Pulmonary arteries: PA peak pressure: 9mm Hg (S).  EKG:  EKG is ordered today. The ekg ordered today demonstrates Atrial fibrillation, rate 90 bpm. Poor R wave progression precordial leads  Recent Labs: 05/09/2014: Hemoglobin 13.3; Platelets 288.0 10/11/2014: ALT 13; BUN 14; Creatinine, Ser 0.97;  Potassium 3.6; Sodium 140 12/15/2014: TSH 16.30*   Lipid Panel    Component Value Date/Time   CHOL 138 05/09/2014 0930   TRIG 71.0 05/09/2014 0930   HDL 44.30 05/09/2014 0930   CHOLHDL 3 05/09/2014 0930   VLDL 14.2 05/09/2014 0930   LDLCALC 80 05/09/2014 0930     Wt Readings from Last 3 Encounters:  03/22/15 225 lb (102.059 kg)  12/15/14 223 lb 4 oz (101.266 kg)  10/11/14 223 lb (101.152 kg)     Other studies Reviewed: Additional studies/ records that were reviewed today include: . Review of the above records demonstrates:   Assessment and Plan:   1. CAD: Stable. No recent chest pains. ASA was stopped since she is on Eliquis and somewhat higher risk of bleeding with prior intracranial disease.     2. PAD: ABI have been stable, last checked April 2015.  Right ABI 0.45, Left ABI 0.59.  She is clinically stable. Will repeat ABI now.   3. Ischemic cardiomyopathy: Last LVEF=40% by echo October 2014. Continue Ace-inh.   4. Carotid artery disease: Mild bilateral disease by dopplers July 2015. Repeat now and if stable with mild disease, will not repeat for 3 years.   5. HTN: BP is well controlled.  No changes  6. Atrial fibrillation: She is rate controlled on Toprol. She is anti-coagulated with Eliquis. We have discussed the risks of CVA. I have once again reviewed her prior brain MRI. She is noted to have multiple areas  of possible old infarcts. She understands the risk of intracranial hemorrhage while on anti-coagulation but certainly her risk of embolic CVA is also very high off of anti-coagulation. Her CHADSVASC score is 6 (9.7% risk of CVA per year).   Current medicines are reviewed at length with the patient today.  The patient does not have concerns regarding medicines.  The following changes have been made:  Stop ASA  Labs/ tests ordered today include:  No orders of the defined types were placed in this encounter.    Disposition:   FU with me in 12 months  Signed, [name], MD 03/22/2015 4:26 PM    [facility] [address] [place], [place], [place]  [address] Phone: [phone]; Fax: [phone]

## 2015-03-30 ENCOUNTER — Encounter: Payer: Self-pay | Admitting: Internal Medicine

## 2015-03-30 DIAGNOSIS — R3915 Urgency of urination: Secondary | ICD-10-CM | POA: Insufficient documentation

## 2015-03-31 ENCOUNTER — Encounter: Payer: Self-pay | Admitting: Internal Medicine

## 2015-03-31 ENCOUNTER — Ambulatory Visit (INDEPENDENT_AMBULATORY_CARE_PROVIDER_SITE_OTHER): Payer: Commercial Managed Care - HMO | Admitting: Internal Medicine

## 2015-03-31 VITALS — BP 132/96 | HR 93 | Temp 98.1°F | Resp 16 | Wt 221.0 lb

## 2015-03-31 DIAGNOSIS — J069 Acute upper respiratory infection, unspecified: Secondary | ICD-10-CM | POA: Diagnosis not present

## 2015-03-31 NOTE — Patient Instructions (Signed)
No antibiotic is needed at this time.  Continue the corcidan medication as needed.    If your symptoms worsen or fail to improve, please contact our office for further instruction, or in case of emergency go directly to the emergency room at the closest medical facility.   General Recommendations:    Please drink plenty of fluids.  Get plenty of rest   Sleep in humidified air  Use saline nasal sprays  Netti pot

## 2015-03-31 NOTE — Progress Notes (Signed)
Subjective:    Patient ID: Yvonne Bailey, female    DOB: Jun 22, 1940, 75 y.o.   MRN: [id number]  HPI She is here for an acute visit for cold symptoms.  She has been having cold symptoms for about one week.  She has been taking coricidan cold products for the past couple of days and started to feel better..   She has tightness in her chest, wheeze, cough that is mostly dry, mild SOB, sneezing and denies other symptoms.  She denies headaches, fever, muscle aches, GI symptoms, nasal congestion and sore throat.    Medications and allergies reviewed with patient and updated if appropriate.  Patient Active Problem List   Diagnosis Date Noted  . Urinary urgency 03/30/2015  . Other abnormal glucose 05/14/2013  . Other musculoskeletal symptoms referable to limbs(729.89) 12/17/2012  . 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  . Small vessel disease, cerebrovascular 12/17/2012  . POSTMENOPAUSAL SYNDROME 08/10/2008  . CAROTID BRUIT, RIGHT 08/10/2008  . Peripheral vascular disease (HCC) 06/04/2007  . Diverticulosis of large intestine 06/04/2007  . Hypothyroidism 02/24/2007  . Hyperlipidemia 02/24/2007  . Essential hypertension 02/24/2007  . DVT 01/21/2007  . Coronary atherosclerosis 10/29/2006    Current Outpatient Prescriptions on File Prior to Visit  Medication Sig Dispense Refill  . apixaban (ELIQUIS) 5 MG TABS tablet Take 1 tablet (5 mg total) by mouth 2 (two) times daily. 60 tablet 6  . Ascorbic Acid (VITAMIN C) 100 MG tablet Take 100 mg by mouth daily.      Marland Kitchen atorvastatin (LIPITOR) 40 MG tablet Take 1 tablet (40 mg total) by mouth daily. 90 tablet 0  . benazepril (LOTENSIN) 20 MG tablet TAKE 1 TABLET (20 MG TOTAL) DAILY. 90 tablet 2  . cholecalciferol (VITAMIN D) 1000 UNITS tablet Take 1,000 Units by mouth daily.    . fish oil-omega-3 fatty acids 1000 MG capsule Take 1,000 mg by mouth daily.     . hydrochlorothiazide (MICROZIDE) 12.5 MG capsule Take 1 capsule (12.5 mg total) by  mouth daily. Must establish with NEW PCP for additional refills. 90 capsule 0  . latanoprost (XALATAN) 0.005 % ophthalmic solution Place 0.005 drops into both eyes every evening.    Marland Kitchen levothyroxine (SYNTHROID, LEVOTHROID) 50 MCG tablet Take 1 tablet (50 mcg total) by mouth daily. 90 tablet 0  . metoprolol succinate (TOPROL-XL) 25 MG 24 hr tablet TAKE 1 TABLET(25 MG) BY MOUTH DAILY 30 tablet 3  . Multiple Vitamin (MULTIVITAMIN) tablet Take 1 tablet by mouth daily.      . vitamin E 100 UNIT capsule Take 100 Units by mouth daily.       No current facility-administered medications on file prior to visit.    Past Medical History  Diagnosis Date  . MI (myocardial infarction) (HCC) 1992    at age 37; TIA treated with coumadin until Plavix initiated in 2006  . Hyperlipidemia   . HTN (hypertension)   . Hypothyroidism   . DVT (deep venous thrombosis) (HCC)     X1  . CAD (coronary artery disease)     S/P  anterior  wall myocardial infarction in '92, treated w/percutaneous transluminal coronary angioplasty of the left anterior descending. EF 45%  . PAD (peripheral artery disease) (HCC)     Right SFA occlusion, severe disease left CFA and SFA  . Carotid artery disease [facility])     Past Surgical History  Procedure Laterality Date  . Fracture lle      '94; pinned  . Appendectomy  at hysterectomy and USO for fibroids, Dr. Kizzie Bane  . Tonsillectomy    . Colonoscopy      negative; 2008, Dr. Lina Sar  . Cardiac catheterization  1992    Dr Charlies Constable  . Total abdominal hysterectomy      & BSO for fibroids    Social History   Social History  . Marital Status: Married    Spouse Name: Fayrene Fearing  . Number of Children: 1  . Years of Education: college   Occupational History  . Teacher     Retired   Social History Main Topics  . Smoking status: Former Smoker    Quit date: 03/11/1990  . Smokeless tobacco: Never Used     Comment: smoked 1973- 1992, up to < 5 cigarettes  . Alcohol Use:  No  . Drug Use: No  . Sexual Activity: Not Asked   Other Topics Concern  . 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 08/31/09- designated party form signed appointing, husband Erinn Schorer; ok to leave msg on home phone [phone].      Caffeine Small Amount one cup very rare.   Right handed.   One child- [name]    Family History  Problem Relation Age of Onset  . Diabetes Mother   . Hypertension Mother   . Stroke Brother     ?> 55  . Coronary artery disease Brother     stent in 22s  . Cancer Neg Hx     Review of Systems  Constitutional: Negative for fever and appetite change.  HENT: Positive for sneezing. Negative for congestion, ear pain, postnasal drip, sinus pressure and sore throat.   Respiratory: Positive for cough (wet, has brought up some phlegm), chest tightness and shortness of breath (mild). Negative for wheezing.   Cardiovascular: Negative for chest pain.  Gastrointestinal: Negative for nausea, abdominal pain and diarrhea.  Musculoskeletal: Negative for myalgias.  Neurological: Negative for dizziness, light-headedness and headaches.       Objective:   Filed Vitals:   03/31/15 1605  BP: 132/96  Pulse: 93  Temp: 98.1 F (36.7 C)  Resp: 16   Filed Weights   03/31/15 1605  Weight: 221 lb (100.245 kg)   Body mass index is 35.14 kg/(m^2).   Physical Exam GENERAL APPEARANCE: Appears stated age, well appearing, NAD EYES: conjunctiva clear, no icterus HEENT: bilateral tympanic membranes and ear canals normal, oropharynx with no erythema, no thyromegaly, trachea midline, no cervical or supraclavicular lymphadenopathy LUNGS: Clear to auscultation without wheeze or crackles, unlabored breathing, good air entry bilaterally HEART: Normal S1,S2 without murmurs EXTREMITIES:  Edema - wearing compression socks        Assessment & Plan:   URI Likely viral No need for an  antibiotic Continue corcidan cold medication  rest, fluids Call Monday if there is no improvement or any worsening.

## 2015-03-31 NOTE — Progress Notes (Signed)
Pre visit review using our clinic review tool, if applicable. No additional management support is needed unless otherwise documented below in the visit note. 

## 2015-04-06 ENCOUNTER — Encounter (HOSPITAL_COMMUNITY): Payer: Commercial Managed Care - HMO

## 2015-04-07 ENCOUNTER — Ambulatory Visit (HOSPITAL_COMMUNITY)
Admission: RE | Admit: 2015-04-07 | Discharge: 2015-04-07 | Disposition: A | Discharge status: Home / Self Care | Payer: Commercial Managed Care - HMO | Source: Ambulatory Visit | Attending: Cardiovascular Disease | Admitting: Cardiovascular Disease

## 2015-04-07 DIAGNOSIS — I6523 Occlusion and stenosis of bilateral carotid arteries: Secondary | ICD-10-CM | POA: Insufficient documentation

## 2015-04-07 DIAGNOSIS — I6502 Occlusion and stenosis of left vertebral artery: Secondary | ICD-10-CM | POA: Insufficient documentation

## 2015-04-07 DIAGNOSIS — R938 Abnormal findings on diagnostic imaging of other specified body structures: Secondary | ICD-10-CM | POA: Insufficient documentation

## 2015-04-07 DIAGNOSIS — E785 Hyperlipidemia, unspecified: Secondary | ICD-10-CM | POA: Insufficient documentation

## 2015-04-07 DIAGNOSIS — I739 Peripheral vascular disease, unspecified: Principal | ICD-10-CM

## 2015-04-07 DIAGNOSIS — I10 Essential (primary) hypertension: Secondary | ICD-10-CM | POA: Diagnosis not present

## 2015-04-07 DIAGNOSIS — I779 Disorder of arteries and arterioles, unspecified: Secondary | ICD-10-CM | POA: Insufficient documentation

## 2015-05-11 ENCOUNTER — Encounter: Payer: Self-pay | Admitting: Internal Medicine

## 2015-05-15 ENCOUNTER — Telehealth: Payer: Self-pay | Admitting: Internal Medicine

## 2015-05-15 NOTE — Telephone Encounter (Signed)
Requesting [employer] referral to go to Dr. Alben Spittle at [facility].  Patient has appointment for 3/16 at 9am.  Patient will be seen for glaucoma eye exam.

## 2015-05-19 NOTE — Telephone Encounter (Signed)
Francine Graven Berkley Harvey #[id number] valid 05/25/2015 - 11/21/2015 for 6 visits

## 2015-05-25 DIAGNOSIS — H401123 Primary open-angle glaucoma, left eye, severe stage: Secondary | ICD-10-CM | POA: Diagnosis not present

## 2015-05-25 DIAGNOSIS — H401112 Primary open-angle glaucoma, right eye, moderate stage: Secondary | ICD-10-CM | POA: Diagnosis not present

## 2015-05-25 DIAGNOSIS — H25013 Cortical age-related cataract, bilateral: Secondary | ICD-10-CM | POA: Diagnosis not present

## 2015-05-25 DIAGNOSIS — H2513 Age-related nuclear cataract, bilateral: Secondary | ICD-10-CM | POA: Diagnosis not present

## 2015-07-15 DIAGNOSIS — J209 Acute bronchitis, unspecified: Secondary | ICD-10-CM | POA: Diagnosis not present

## 2015-07-15 DIAGNOSIS — R0602 Shortness of breath: Secondary | ICD-10-CM | POA: Diagnosis not present

## 2015-07-19 ENCOUNTER — Other Ambulatory Visit (INDEPENDENT_AMBULATORY_CARE_PROVIDER_SITE_OTHER): Payer: Commercial Managed Care - HMO

## 2015-07-19 ENCOUNTER — Other Ambulatory Visit: Payer: Self-pay | Admitting: Internal Medicine

## 2015-07-19 ENCOUNTER — Ambulatory Visit (INDEPENDENT_AMBULATORY_CARE_PROVIDER_SITE_OTHER): Payer: Commercial Managed Care - HMO | Admitting: Internal Medicine

## 2015-07-19 ENCOUNTER — Encounter: Payer: Self-pay | Admitting: Internal Medicine

## 2015-07-19 ENCOUNTER — Ambulatory Visit (INDEPENDENT_AMBULATORY_CARE_PROVIDER_SITE_OTHER)
Admission: RE | Admit: 2015-07-19 | Discharge: 2015-07-19 | Disposition: A | Discharge status: Home / Self Care | Payer: Commercial Managed Care - HMO | Source: Ambulatory Visit | Attending: Internal Medicine | Admitting: Internal Medicine

## 2015-07-19 VITALS — BP 142/98 | HR 99 | Temp 98.3°F | Resp 18 | Wt 223.0 lb

## 2015-07-19 DIAGNOSIS — I10 Essential (primary) hypertension: Secondary | ICD-10-CM

## 2015-07-19 DIAGNOSIS — R0602 Shortness of breath: Secondary | ICD-10-CM | POA: Diagnosis not present

## 2015-07-19 DIAGNOSIS — E038 Other specified hypothyroidism: Secondary | ICD-10-CM

## 2015-07-19 DIAGNOSIS — R600 Localized edema: Secondary | ICD-10-CM | POA: Diagnosis not present

## 2015-07-19 DIAGNOSIS — I4891 Unspecified atrial fibrillation: Secondary | ICD-10-CM | POA: Insufficient documentation

## 2015-07-19 HISTORY — DX: Localized edema: R60.0

## 2015-07-19 LAB — COMPREHENSIVE METABOLIC PANEL
ALT: 46 U/L — ABNORMAL HIGH (ref 0–35)
AST: 43 U/L — ABNORMAL HIGH (ref 0–37)
Albumin: 4.1 g/dL (ref 3.5–5.2)
Alkaline Phosphatase: 84 U/L (ref 39–117)
BUN: 19 mg/dL (ref 6–23)
CO2: 24 mEq/L (ref 19–32)
Calcium: 9 mg/dL (ref 8.4–10.5)
Chloride: 109 mEq/L (ref 96–112)
Creatinine, Ser: 1.02 mg/dL (ref 0.40–1.20)
GFR: 67.94 mL/min (ref >60.00)
Glucose, Bld: 122 mg/dL — ABNORMAL HIGH (ref 70–99)
Potassium: 3.7 mEq/L (ref 3.5–5.1)
Sodium: 144 mEq/L (ref 135–145)
Total Bilirubin: 1.1 mg/dL (ref 0.2–1.2)
Total Protein: 6.8 g/dL (ref 6.0–8.3)

## 2015-07-19 LAB — TSH: TSH: 17.07 u[IU]/mL — ABNORMAL HIGH (ref 0.35–4.50)

## 2015-07-19 MED ORDER — LEVOTHYROXINE SODIUM 88 MCG PO TABS: 88.0000 ug | ORAL_TABLET | Freq: Every day | ORAL | Status: DC

## 2015-07-19 MED ORDER — METOPROLOL SUCCINATE ER 50 MG PO TB24: 50.0000 mg | ORAL_TABLET | Freq: Every day | ORAL | Status: DC

## 2015-07-19 NOTE — Progress Notes (Signed)
Subjective:    Patient ID: Yvonne Bailey, female    DOB: 1940/12/05, 75 y.o.   MRN: [id number]  HPI She is here for an acute visit.   Shortness of breath:   For over one week she has had some shortness of breath.  She then developed cold symptoms and was taking coricidan and it helped.  4 days ago she went to urgent care.  She was told she had chest congestion.  She had a prednisone shot and neb treatment there, which helped.  She was given prescriptions for prednisone, an anatibiotic and an inhaler.  It has helped, but she is still having SOB / difficulty breathing.  If she walks from her car to her house she is out of breath - she thinks this has been going on longer than one week, but is not sure how long.  This is new.  She really noticed this when she started taking eliquis last year, but she knows it is not related to the medication.    She denies chest pain.  She has occasional heart racing.  She denies increased swelling in her ankles.    Hypothyroidism:  She is taking her medication daily.  She denies any recent changes in energy or weight that are unexplained. Her tsh was elevated in the fall and her medication was adjusted. She was supposed to have her TSH rechecked , but this was not done.   Medications and allergies reviewed with patient and updated if appropriate.  Patient Active Problem List   Diagnosis Date Noted  . Bilateral leg edema 07/19/2015  . Urinary urgency 03/30/2015  . Other abnormal glucose 05/14/2013  . Other musculoskeletal symptoms referable to limbs(729.89) 12/17/2012  . 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  . Small vessel disease, cerebrovascular 12/17/2012  . POSTMENOPAUSAL SYNDROME 08/10/2008  . CAROTID BRUIT, RIGHT 08/10/2008  . Peripheral vascular disease (HCC) 06/04/2007  . Diverticulosis of large intestine 06/04/2007  . Hypothyroidism 02/24/2007  . Hyperlipidemia 02/24/2007  . Essential hypertension 02/24/2007  . DVT 01/21/2007  . Coronary  atherosclerosis 10/29/2006    Current Outpatient Prescriptions on File Prior to Visit  Medication Sig Dispense Refill  . apixaban (ELIQUIS) 5 MG TABS tablet Take 1 tablet (5 mg total) by mouth 2 (two) times daily. 60 tablet 6  . Ascorbic Acid (VITAMIN C) 100 MG tablet Take 100 mg by mouth daily.      Marland Kitchen atorvastatin (LIPITOR) 40 MG tablet Take 1 tablet (40 mg total) by mouth daily. 90 tablet 0  . benazepril (LOTENSIN) 20 MG tablet TAKE 1 TABLET (20 MG TOTAL) DAILY. 90 tablet 2  . cholecalciferol (VITAMIN D) 1000 UNITS tablet Take 1,000 Units by mouth daily.    . fish oil-omega-3 fatty acids 1000 MG capsule Take 1,000 mg by mouth daily.     . hydrochlorothiazide (MICROZIDE) 12.5 MG capsule Take 1 capsule (12.5 mg total) by mouth daily. Must establish with NEW PCP for additional refills. 90 capsule 0  . latanoprost (XALATAN) 0.005 % ophthalmic solution Place 0.005 drops into both eyes every evening.    Marland Kitchen levothyroxine (SYNTHROID, LEVOTHROID) 50 MCG tablet Take 1 tablet (50 mcg total) by mouth daily. 90 tablet 0  . metoprolol succinate (TOPROL-XL) 25 MG 24 hr tablet TAKE 1 TABLET(25 MG) BY MOUTH DAILY 30 tablet 3  . Multiple Vitamin (MULTIVITAMIN) tablet Take 1 tablet by mouth daily.      . vitamin E 100 UNIT capsule Take 100 Units by mouth daily.  No current facility-administered medications on file prior to visit.    Past Medical History  Diagnosis Date  . MI (myocardial infarction) (HCC) 1992    at age 63; TIA treated with coumadin until Plavix initiated in 2006  . Hyperlipidemia   . HTN (hypertension)   . Hypothyroidism   . DVT (deep venous thrombosis) (HCC)     X1  . CAD (coronary artery disease)     S/P  anterior  wall myocardial infarction in '92, treated w/percutaneous transluminal coronary angioplasty of the left anterior descending. EF 45%  . PAD (peripheral artery disease) (HCC)     Right SFA occlusion, severe disease left CFA and SFA  . Carotid artery disease [facility])      Past Surgical History  Procedure Laterality Date  . Fracture lle      '94; pinned  . Appendectomy      at hysterectomy and USO for fibroids, Dr. Kizzie Bane  . Tonsillectomy    . Colonoscopy      negative; 2008, Dr. Lina Sar  . Cardiac catheterization  1992    Dr Charlies Constable  . Total abdominal hysterectomy      & BSO for fibroids    Social History   Social History  . Marital Status: Married    Spouse Name: Fayrene Fearing  . Number of Children: 1  . Years of Education: college   Occupational History  . Teacher     Retired   Social History Main Topics  . Smoking status: Former Smoker    Quit date: 03/11/1990  . Smokeless tobacco: Never Used     Comment: smoked 1973- 1992, up to < 5 cigarettes  . Alcohol Use: No  . Drug Use: No  . Sexual Activity: Not Asked   Other Topics Concern  . 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 08/31/09- designated party form signed appointing, husband Sakshi Goodbar; ok to leave msg on home phone [phone].      Caffeine Small Amount one cup very rare.   Right handed.   One child- [name]    Family History  Problem Relation Age of Onset  . Diabetes Mother   . Hypertension Mother   . Stroke Brother     ?> 55  . Coronary artery disease Brother     stent in 75s  . Cancer Neg Hx     Review of Systems  Constitutional: Negative for fever and chills.       Energy level low  HENT: Positive for sinus pressure (sometimes). Negative for congestion, ear pain and sore throat.   Respiratory: Positive for cough (dry, occasionally productive), shortness of breath and wheezing.   Cardiovascular: Positive for palpitations (occasional). Negative for chest pain. Leg swelling: occaisonally related to activity.   Gastrointestinal: Negative for abdominal pain and diarrhea.  Musculoskeletal: Negative for myalgias.  Neurological: Negative for dizziness, light-headedness and headaches.        Objective:   Filed Vitals:   07/19/15 0841  BP: 142/98  Pulse: 99  Temp: 98.3 F (36.8 C)  Resp: 18   Filed Weights   07/19/15 0841  Weight: 223 lb (101.152 kg)   Body mass index is 35.46 kg/(m^2).   Physical Exam GENERAL APPEARANCE: Appears stated age, well appearing, NAD EYES: conjunctiva clear, no icterus HEENT: bilateral tympanic membranes and ear canals normal, oropharynx with no erythema, no thyromegaly, trachea midline, no cervical or supraclavicular lymphadenopathy LUNGS: Clear  to auscultation without wheeze or crackles, unlabored breathing, good air entry bilaterally HEART: Irregular rhythm  EXTREMITIES: 1+ pitting edema bilaterally      Assessment & Plan:   See Problem List for Assessment and Plan of chronic medical problems.  Follow up in 1 month

## 2015-07-19 NOTE — Assessment & Plan Note (Signed)
Blood pressure elevated here today Increase metoprolol from 25 mg daily to 50 mg daily given that her heart rate is on the high side and she has A. Fib Advised to start monitoring her blood pressure regularly Follow-up in one month Check CMP

## 2015-07-19 NOTE — Assessment & Plan Note (Signed)
?   More than usual - she is unsure if it is worse Monitor closely Follow up in one month May need increase in hctz

## 2015-07-19 NOTE — Assessment & Plan Note (Addendum)
Rate is irregular here today and her heart rate is on the higher side Since her blood pressure is also elevated I will increase her metoprolol to 50 mg daily On Eliquis Advised her to follow-up with her cardiologist sooner than her next appointment Follow-up with me in one month, sooner if needed

## 2015-07-19 NOTE — Patient Instructions (Addendum)
Have blood work and a chest xray today.  Test(s) ordered today. Your results will be released to MyChart (or called to you) after review, usually within 72hours after test completion. If any changes need to be made, you will be notified at that same time.   Medications reviewed and updated.  Changes include increasing the metoprolol to 50 mg daily.    Your prescription(s) have been submitted to your pharmacy. Please take as directed and contact our office if you believe you are having problem(s) with the medication(s).   Please followup in 1 months

## 2015-07-19 NOTE — Progress Notes (Signed)
Pre visit review using our clinic review tool, if applicable. No additional management support is needed unless otherwise documented below in the visit note. 

## 2015-07-19 NOTE — Assessment & Plan Note (Signed)
Check tsh  Titrate med dose if needed  

## 2015-07-20 ENCOUNTER — Other Ambulatory Visit: Payer: Self-pay | Admitting: *Deleted

## 2015-07-20 ENCOUNTER — Encounter: Payer: Self-pay | Admitting: Emergency Medicine

## 2015-07-20 MED ORDER — ALBUTEROL SULFATE HFA 108 (90 BASE) MCG/ACT IN AERS: 1.0000 | INHALATION_SPRAY | Freq: Four times a day (QID) | RESPIRATORY_TRACT | Status: DC | PRN

## 2015-07-20 NOTE — Telephone Encounter (Signed)
Received call pt states she fail to ask for new rx on her inhaler. Verified if she is needing the ventolin she stated yes. Inform will send rx to [facility]...Raechel Chute

## 2015-07-21 ENCOUNTER — Other Ambulatory Visit: Payer: Self-pay | Admitting: Cardiovascular Disease

## 2015-07-21 ENCOUNTER — Other Ambulatory Visit: Payer: Self-pay

## 2015-07-21 ENCOUNTER — Ambulatory Visit: Payer: Commercial Managed Care - HMO | Admitting: Internal Medicine

## 2015-07-21 MED ORDER — APIXABAN 5 MG PO TABS: 5.0000 mg | ORAL_TABLET | Freq: Two times a day (BID) | ORAL | Status: DC

## 2015-07-21 NOTE — Telephone Encounter (Signed)
Yvonne Hazel, MD at 03/22/2015 10:05 AM  apixaban (ELIQUIS) 5 MG TABS tablet  Take 1 tablet (5 mg total) by mouth 2 (two) times daily Assessment and Plan:   1. CAD: Stable. No recent chest pains. ASA was stopped since she is on Eliquis and somewhat higher risk of bleeding with prior intracranial disease  Current medicines are reviewed at length with the patient today. The patient does not have concerns regarding medicines.  The following changes have been made: Stop ASA   Patient Instructions     Medication Instructions:  Your physician recommends that you continue on your current medications as directed. Please refer to the Current Medication list given to you today.

## 2015-07-24 NOTE — Progress Notes (Signed)
Cardiology Office Note:    Date:  07/25/2015   ID:  Yvonne Bailey, DOB Jan 06, 1941, MRN [id number]  PCP:  Pincus Sanes, MD  Cardiologist:  Dr. [name]   Electrophysiologist:  n/a  Referring MD: Pincus Sanes, MD   Chief Complaint  Patient presents with  . Shortness of Breath    History of Present Illness:     Yvonne Bailey is a 75 y.o. female with a hx of CAD, DCM, HTN, PAD, carotid stenosis, HL, prior CVA.  Previously followed by Dr. Juanda Chance.  She is s/p anterior MI in 1992 tx with PCI.  EF in 2007 was 45%.  LE arterial duplex in 7/12 demonstrated long occlusion of R SFA and severe stenosis of L CVA and SFA (ABI on R 0.5, L 0.63).  ABI 4/15 with R 0.45, L 0.59.  Dx with AFib in 3/16 and placed on Eliquis. She is treated with rate control.  Last seen by Dr. [name] 1/17.  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  Dr. Clifton James reviewed this with the patient and noted that her risk for intracranial hemorrhage is increased however her risk of embolic CVA is great.  Therefore, she was continued on Eliquis.    Patient was recently seen at urgent care for increasing dyspnea and congestion. She was placed on prednisone, bronchodilators and azithromycin. She then saw her primary care physician. Chest x-ray demonstrated small bilateral pleural effusions. Her beta blocker dose was increased secondary to increased heart rate. She was asked to follow-up here. Overall, she notes improving symptoms. She still notes increased dyspnea with exertion. Overall, she is NYHA 2b. She sleeps on 1-2 pillows chronically without change. Denies PND. She does note increased LE edema. Her left leg is always greater than her right. She denies chest discomfort. She denies syncope. She denies any bleeding issues.    Past Medical History  Diagnosis Date  . MI  (myocardial infarction) (HCC) 1992    at age 37; TIA treated with coumadin until Plavix initiated in 2006  . Hyperlipidemia   . HTN (hypertension)   . Hypothyroidism   . DVT (deep venous thrombosis) (HCC)     X1  . CAD (coronary artery disease)     S/P  anterior  wall myocardial infarction in '92, treated w/percutaneous transluminal coronary angioplasty of the left anterior descending. EF 45%  . PAD (peripheral artery disease) (HCC)     Right SFA occlusion, severe disease left CFA and SFA  . Carotid artery disease [facility])     Past Surgical History  Procedure Laterality Date  . Fracture lle      '94; pinned  . Appendectomy      at hysterectomy and USO for fibroids, Dr. Kizzie Bane  . Tonsillectomy    . Colonoscopy      negative; 2008, Dr. Lina Sar  . Cardiac catheterization  1992    Dr Charlies Constable  . Total abdominal hysterectomy      & BSO for fibroids    Current Medications: Outpatient Prescriptions Prior to Visit  Medication Sig Dispense Refill  . albuterol (VENTOLIN HFA) 108 (90 Base) MCG/ACT inhaler Inhale 1-2 puffs into the lungs every 6 (six) hours as needed for wheezing or shortness of breath. 18 g 1  . apixaban (ELIQUIS) 5 MG TABS tablet Take 1 tablet (5 mg total) by mouth 2 (two)  times daily. 180 tablet 2  . Ascorbic Acid (VITAMIN C) 100 MG tablet Take 100 mg by mouth daily.      Marland Kitchen atorvastatin (LIPITOR) 40 MG tablet Take 1 tablet (40 mg total) by mouth daily. 90 tablet 0  . benazepril (LOTENSIN) 20 MG tablet TAKE 1 TABLET (20 MG TOTAL) DAILY. 90 tablet 2  . cholecalciferol (VITAMIN D) 1000 UNITS tablet Take 1,000 Units by mouth daily.    . fish oil-omega-3 fatty acids 1000 MG capsule Take 1,000 mg by mouth daily.     Marland Kitchen latanoprost (XALATAN) 0.005 % ophthalmic solution Place 0.005 drops into both eyes every evening.    Marland Kitchen levothyroxine (SYNTHROID, LEVOTHROID) 88 MCG tablet Take 1 tablet (88 mcg total) by mouth daily. 90 tablet 3  . metoprolol succinate (TOPROL-XL) 50 MG  24 hr tablet Take 1 tablet (50 mg total) by mouth daily. Take with or immediately following a meal. 90 tablet 0  . Multiple Vitamin (MULTIVITAMIN) tablet Take 1 tablet by mouth daily.      . vitamin E 100 UNIT capsule Take 100 Units by mouth daily.      . hydrochlorothiazide (MICROZIDE) 12.5 MG capsule Take 1 capsule (12.5 mg total) by mouth daily. Must establish with NEW PCP for additional refills. 90 capsule 0  . apixaban (ELIQUIS) 5 MG TABS tablet Take 1 tablet (5 mg total) by mouth 2 (two) times daily. (Patient not taking: Reported on 07/25/2015) 60 tablet 1  . metoprolol succinate (TOPROL-XL) 50 MG 24 hr tablet TAKE 1 TABLET BY MOUTH DAILY, TAKE WITH OR IMMEDIATELY FOLLOWING A MEAL (Patient not taking: Reported on 07/25/2015) 90 tablet 3  . prednisoLONE 5 MG TABS tablet Take 5 mg by mouth daily. Reported on 07/25/2015     No facility-administered medications prior to visit.      Allergies:   Cilostazol   Social History   Social History  . Marital Status: Married    Spouse Name: Fayrene Fearing  . Number of Children: 1  . Years of Education: college   Occupational History  . Teacher     Retired   Social History Main Topics  . Smoking status: Former Smoker    Quit date: 03/11/1990  . Smokeless tobacco: Never Used     Comment: smoked 1973- 1992, up to < 5 cigarettes  . Alcohol Use: No  . Drug Use: No  . Sexual Activity: Not Asked   Other Topics Concern  . 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 08/31/09- designated party form signed appointing, husband Neah Sporrer; ok to leave msg on home phone [phone].      Caffeine Small Amount one cup very rare.   Right handed.   One child- [name]     Family History:  The patient's family history includes Coronary artery disease in her brother; Diabetes in her mother; Hypertension in her mother; Stroke in her brother. There is no history of Cancer.   ROS:   Please see  the history of present illness.    ROS All other systems reviewed and are negative.   Physical Exam:    VS:  BP 118/70 mmHg  Pulse 96  Ht 5\' 7"  (1.702 m)  Wt 219 lb (99.338 kg)  BMI 34.29 kg/m2   GEN: Well nourished, well developed, in no acute distress HEENT: normal Neck: no JVD at 90, no masses Cardiac: Normal S1/S2, irreg irreg rhythm; no murmurs, rubs, or  gallops, tight 1+ bilateral LE edema;     Respiratory:  Decreased breath sounds bilaterally; no wheezing, rhonchi or rales GI: soft, nontender, nondistended MS: no deformity or atrophy Skin: warm and dry Neuro: No focal deficits  Psych: Alert and oriented x 3, normal affect  Wt Readings from Last 3 Encounters:  07/25/15 219 lb (99.338 kg)  07/19/15 223 lb (101.152 kg)  03/31/15 221 lb (100.245 kg)      Studies/Labs Reviewed:     EKG:  EKG is  ordered today.  The ekg ordered today demonstrates AFib, HR 95  Recent Labs: 07/19/2015: ALT 46*; BUN 19; Creatinine, Ser 1.02; Potassium 3.7; Sodium 144; TSH 17.07*   Recent Lipid Panel    Component Value Date/Time   CHOL 138 05/09/2014 0930   TRIG 71.0 05/09/2014 0930   HDL 44.30 05/09/2014 0930   CHOLHDL 3 05/09/2014 0930   VLDL 14.2 05/09/2014 0930   LDLCALC 80 05/09/2014 0930    Additional studies/ records that were reviewed today include:   Dg Chest 2 View  07/19/2015  CLINICAL DATA:  Shortness of breath with exertion for 3-4 days. EXAM: CHEST  2 VIEW COMPARISON:  None. FINDINGS: Cardiomegaly. There are small bilateral pleural effusions with bibasilar opacities. No overt edema. No acute bony abnormality. IMPRESSION: Small bilateral effusions with bibasilar atelectasis or infiltrates. Cardiomegaly. Electronically Signed   By: Charlett Nose M.D.   On: 07/19/2015 10:29     Carotid US 04/07/15 bilat ICA 1-39% FU prn  ABIs 1/17 R 0.65, L 0.72  Echo 10/14 No mural apical clot, mild LVH, EF 35-40%, diff HK, MAC, mild MR, mild LAE, PASP 37 mmHg   ASSESSMENT:      1. Chronic systolic CHF (congestive heart failure) (HCC)   2. Coronary artery disease involving native coronary artery of native heart without angina pectoris   3. Peripheral vascular disease (HCC)   4. Cardiomyopathy, ischemic   5. Carotid stenosis, bilateral   6. Chronic atrial fibrillation (HCC)   7. Essential hypertension     PLAN:     In order of problems listed above:  1. Chronic systolic CHF - She is NYHA 2b. Recent URI likely exacerbated volume excess. I suspect that she will feel better on furosemide than hydrochlorothiazide.  EF by echocardiogram in 10/14 was 35-40%. Obtain CBC, BNP.   -  Arrange follow-up echocardiogram.   -  DC HCTZ.   -  Add Lasix 40 mg daily 5 days, then 20 mg daily.   -  Add potassium 20 mEq daily 5 days, then 10 mg once daily.   -  BMET 1 week.   -  Follow-up in the next 3 weeks.  2. CAD - No angina. She is not on ASA as she is on Eliquis.  Continue statin, beta-blocker.  If echocardiogram has significantly changed since 2014 or her dyspnea is not improved, she will likely need stress testing.  3. PAD - ABIs stable.  Recent ABIs in 1/17 with improved readings on L.     4. Ischemic CM -  Continue beta-blocker, ACE inhibitor. Repeat echocardiogram as noted.  5. Carotid stenosis - Stable mild disease 1/17 by doppler US.  FU duplex prn.    6. Chronic AFib -Heart rate fairly stable. CHADS2-VASc=6.  Continue Eliquis. Obtain CBC.   7. HTN - Controlled.    Medication Adjustments/Labs and Tests Ordered: Current medicines are reviewed at length with the patient today.  Concerns regarding medicines are outlined above.  Medication changes, Labs  and Tests ordered today are outlined in the Patient Instructions noted below. Patient Instructions  Medication Instructions:  1. STOP HCTZ 2. START LASIX 20 MG TABLET WITH THE FOLLOWING DIRECTIONS: LASIX 40 MG DAILY FOR 5 DAYS; THEN DECREASE LASIX TO 20 MG DAILY 3. START POTASSIUM 10 MEQ TABLET WITH THE  FOLLOWING DIRECTIONS: POTASSIUM 20 MEQ DAILY FOR 5 DAYS THEN DECREASE POTASSIUM TO 10 MEQ DAILY  Labwork: 1. CBC W/DIFF, BNP 2. BMET TO BE DONE IN 1 WEEK Testing/Procedures: Your physician has requested that you have an echocardiogram. Echocardiography is a painless test that uses sound waves to create images of your heart. It provides your doctor with information about the size and shape of your heart and how well your heart's chambers and valves are working. This procedure takes approximately one hour. There are no restrictions for this procedure. Follow-Up: Kruti Horacek, PAC 3 WEEKS SAME DAY DR. Clifton James IS IN THE OFFICE Any Other Special Instructions Will Be Listed Below (If Applicable). If you need a refill on your cardiac medications before your next appointment, please call your pharmacy.   Signed, Tereso Newcomer, PA-C  07/25/2015 4:14 PM    [facility] [address] [place], [place], [place]  [address] Phone: [phone]; Fax: [phone]

## 2015-07-25 ENCOUNTER — Ambulatory Visit (INDEPENDENT_AMBULATORY_CARE_PROVIDER_SITE_OTHER): Payer: Commercial Managed Care - HMO | Admitting: Physician Assistant

## 2015-07-25 ENCOUNTER — Other Ambulatory Visit: Payer: Self-pay | Admitting: Physician Assistant

## 2015-07-25 ENCOUNTER — Encounter: Payer: Self-pay | Admitting: Physician Assistant

## 2015-07-25 VITALS — BP 118/70 | HR 96 | Ht 67.0 in | Wt 219.0 lb

## 2015-07-25 DIAGNOSIS — I482 Chronic atrial fibrillation, unspecified: Secondary | ICD-10-CM

## 2015-07-25 DIAGNOSIS — I509 Heart failure, unspecified: Secondary | ICD-10-CM | POA: Diagnosis not present

## 2015-07-25 DIAGNOSIS — I10 Essential (primary) hypertension: Secondary | ICD-10-CM

## 2015-07-25 DIAGNOSIS — I2510 Atherosclerotic heart disease of native coronary artery without angina pectoris: Secondary | ICD-10-CM

## 2015-07-25 DIAGNOSIS — I255 Ischemic cardiomyopathy: Secondary | ICD-10-CM

## 2015-07-25 DIAGNOSIS — I6523 Occlusion and stenosis of bilateral carotid arteries: Secondary | ICD-10-CM

## 2015-07-25 DIAGNOSIS — I5022 Chronic systolic (congestive) heart failure: Secondary | ICD-10-CM | POA: Diagnosis not present

## 2015-07-25 DIAGNOSIS — I739 Peripheral vascular disease, unspecified: Secondary | ICD-10-CM | POA: Diagnosis not present

## 2015-07-25 LAB — CBC WITH DIFFERENTIAL/PLATELET
Basophils Absolute: 105 cells/uL (ref 0–200)
Basophils Relative: 1 %
Eosinophils Absolute: 105 cells/uL (ref 15–500)
Eosinophils Relative: 1 %
HCT: 41.2 % (ref 35.0–45.0)
Hemoglobin: 13.2 g/dL (ref 11.7–15.5)
Lymphocytes Relative: 22 %
Lymphs Abs: 2310 cells/uL (ref 850–3900)
MCH: 30.6 pg (ref 27.0–33.0)
MCHC: 32 g/dL (ref 32.0–36.0)
MCV: 95.6 fL (ref 80.0–100.0)
MPV: 11.2 fL (ref 7.5–12.5)
Monocytes Absolute: 1050 cells/uL — ABNORMAL HIGH (ref 200–950)
Monocytes Relative: 10 %
Neutro Abs: 6930 cells/uL (ref 1500–7800)
Neutrophils Relative %: 66 %
Platelets: 274 10*3/uL (ref 140–400)
RBC: 4.31 MIL/uL (ref 3.80–5.10)
RDW: 14.1 % (ref 11.0–15.0)
WBC: 10.5 10*3/uL (ref 3.8–10.8)

## 2015-07-25 MED ORDER — POTASSIUM CHLORIDE ER 10 MEQ PO TBCR: 10.0000 meq | EXTENDED_RELEASE_TABLET | ORAL | Status: DC

## 2015-07-25 MED ORDER — FUROSEMIDE 20 MG PO TABS: 20.0000 mg | ORAL_TABLET | ORAL | Status: DC

## 2015-07-25 NOTE — Patient Instructions (Addendum)
Medication Instructions:  1. STOP HCTZ 2. START LASIX 20 MG TABLET WITH THE FOLLOWING DIRECTIONS: LASIX 40 MG DAILY FOR 5 DAYS; THEN DECREASE LASIX TO 20 MG DAILY 3. START POTASSIUM 10 MEQ TABLET WITH THE FOLLOWING DIRECTIONS: POTASSIUM 20 MEQ DAILY FOR 5 DAYS THEN DECREASE POTASSIUM TO 10 MEQ DAILY  Labwork: 1. CBC W/DIFF, BNP 2. BMET TO BE DONE IN 1 WEEK Testing/Procedures: Your physician has requested that you have an echocardiogram. Echocardiography is a painless test that uses sound waves to create images of your heart. It provides your doctor with information about the size and shape of your heart and how well your heart's chambers and valves are working. This procedure takes approximately one hour. There are no restrictions for this procedure. Follow-Up: SCOTT WEAVER, PAC 3 WEEKS SAME DAY DR. Clifton James IS IN THE OFFICE Any Other Special Instructions Will Be Listed Below (If Applicable). If you need a refill on your cardiac medications before your next appointment, please call your pharmacy.

## 2015-07-26 LAB — BRAIN NATRIURETIC PEPTIDE: Brain Natriuretic Peptide: 177.4 pg/mL — ABNORMAL HIGH (ref <100)

## 2015-07-27 ENCOUNTER — Telehealth: Payer: Self-pay | Admitting: *Deleted

## 2015-07-27 NOTE — Telephone Encounter (Signed)
Pt notified of lab results by phone with verbal understanding. Pt agreeable to taking the increased dose of lasix and K+ for 3 days istead of 5 days as outlined in ov 5/16. After the 3 days of increased lasix/K+ pt knows to resume her current doses.

## 2015-08-01 ENCOUNTER — Other Ambulatory Visit (INDEPENDENT_AMBULATORY_CARE_PROVIDER_SITE_OTHER): Payer: Commercial Managed Care - HMO | Admitting: *Deleted

## 2015-08-01 DIAGNOSIS — I509 Heart failure, unspecified: Secondary | ICD-10-CM | POA: Diagnosis not present

## 2015-08-01 DIAGNOSIS — I5022 Chronic systolic (congestive) heart failure: Secondary | ICD-10-CM

## 2015-08-01 LAB — BASIC METABOLIC PANEL
BUN: 17 mg/dL (ref 7–25)
CO2: 28 mmol/L (ref 20–31)
Calcium: 8.9 mg/dL (ref 8.6–10.4)
Chloride: 105 mmol/L (ref 98–110)
Creat: 0.97 mg/dL — ABNORMAL HIGH (ref 0.60–0.93)
Glucose, Bld: 121 mg/dL — ABNORMAL HIGH (ref 65–99)
Potassium: 3.7 mmol/L (ref 3.5–5.3)
Sodium: 141 mmol/L (ref 135–146)

## 2015-08-03 ENCOUNTER — Telehealth: Payer: Self-pay | Admitting: *Deleted

## 2015-08-03 DIAGNOSIS — H401112 Primary open-angle glaucoma, right eye, moderate stage: Secondary | ICD-10-CM | POA: Diagnosis not present

## 2015-08-03 DIAGNOSIS — H40053 Ocular hypertension, bilateral: Secondary | ICD-10-CM | POA: Diagnosis not present

## 2015-08-03 DIAGNOSIS — H401123 Primary open-angle glaucoma, left eye, severe stage: Secondary | ICD-10-CM | POA: Diagnosis not present

## 2015-08-03 NOTE — Telephone Encounter (Signed)
DPR ok to leave detailed message on machine. Lab results lmom. Continue current Tx plan. If any questions cb [phone].

## 2015-08-04 ENCOUNTER — Ambulatory Visit: Payer: Commercial Managed Care - HMO | Admitting: Physician Assistant

## 2015-08-10 ENCOUNTER — Encounter: Payer: Self-pay | Admitting: Physician Assistant

## 2015-08-10 ENCOUNTER — Ambulatory Visit (HOSPITAL_COMMUNITY): Payer: Commercial Managed Care - HMO | Attending: Cardiology

## 2015-08-10 ENCOUNTER — Telehealth: Payer: Self-pay | Admitting: *Deleted

## 2015-08-10 ENCOUNTER — Other Ambulatory Visit: Payer: Self-pay

## 2015-08-10 DIAGNOSIS — I340 Nonrheumatic mitral (valve) insufficiency: Secondary | ICD-10-CM | POA: Diagnosis not present

## 2015-08-10 DIAGNOSIS — Z8249 Family history of ischemic heart disease and other diseases of the circulatory system: Secondary | ICD-10-CM | POA: Diagnosis not present

## 2015-08-10 DIAGNOSIS — E785 Hyperlipidemia, unspecified: Secondary | ICD-10-CM | POA: Diagnosis not present

## 2015-08-10 DIAGNOSIS — I5022 Chronic systolic (congestive) heart failure: Secondary | ICD-10-CM | POA: Diagnosis not present

## 2015-08-10 DIAGNOSIS — Z87891 Personal history of nicotine dependence: Secondary | ICD-10-CM | POA: Diagnosis not present

## 2015-08-10 DIAGNOSIS — I358 Other nonrheumatic aortic valve disorders: Secondary | ICD-10-CM | POA: Diagnosis not present

## 2015-08-10 DIAGNOSIS — I255 Ischemic cardiomyopathy: Secondary | ICD-10-CM | POA: Insufficient documentation

## 2015-08-10 DIAGNOSIS — I110 Hypertensive heart disease with heart failure: Secondary | ICD-10-CM | POA: Diagnosis not present

## 2015-08-10 DIAGNOSIS — I371 Nonrheumatic pulmonary valve insufficiency: Secondary | ICD-10-CM | POA: Diagnosis not present

## 2015-08-10 DIAGNOSIS — I739 Peripheral vascular disease, unspecified: Secondary | ICD-10-CM | POA: Diagnosis not present

## 2015-08-10 DIAGNOSIS — I2510 Atherosclerotic heart disease of native coronary artery without angina pectoris: Secondary | ICD-10-CM | POA: Diagnosis not present

## 2015-08-10 NOTE — Telephone Encounter (Signed)
Pt notified of echo results and findings by phone with verbal understanding. Pt agreeable to repeat echo 6/20 @ 3 pm.  Advised to keep appt with Lorin Picket W. PA 08/14/15.

## 2015-08-13 NOTE — Progress Notes (Signed)
Cardiology Office Note:    Date:  08/14/2015   ID:  Yvonne Bailey, DOB 01/19/1941, MRN [id number]  PCP:  Yvonne Sanes, MD  Cardiologist:  Yvonne. Verne Bailey   Electrophysiologist:  n/a  Referring MD: Yvonne Sanes, MD   Chief Complaint  Patient presents with  . Congestive Heart Failure    follow up    History of Present Illness:     Yvonne Bailey is a 75 y.o. female with a hx of CAD, DCM, HTN, PAD, carotid stenosis, HL, prior CVA. Previously followed by Yvonne. Juanda Bailey. She is s/p anterior MI in 1992 tx with PCI. EF in 2007 was 45%. LE arterial duplex in 7/12 demonstrated long occlusion of R SFA and severe stenosis of L CVA and SFA (ABI on R 0.5, L 0.63). ABI 4/15 with R 0.45, L 0.59. Dx with AFib in 3/16 and placed on Eliquis. She is treated with rate control strategy. Last seen by Yvonne. Verne Bailey 1/17. 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 Yvonne. Clifton Bailey reviewed this with the patient and noted that her risk for intracranial hemorrhage is increased however her risk of embolic CVA is greater. Therefore, she was continued on Eliquis.   I saw her 07/25/15. She had recently been seen by primary care for increasing dyspnea and congestion. She was treated with prednisone, bronchodilators and azithromycin. Chest x-ray demonstrated small bilateral pleural effusions.  I changed her HCTZ to Lasix. Follow-up echocardiogram was obtained.  This demonstrated worsening LV function with an EF of 20-25%, moderate MR, moderate LAE, PASP 47 mmHg. Endocardial segments were not adequately visualized and recommendation was to repeat a limited echo with Definity contrast for a more accurate assessment of LV function. Limited echo with Definity contrast still needs to be arranged.  She returns for FU.  Here today with her son. She feels that her breathing  is about the same. Overall, she describes NYHA 2b symptoms. Her son notes that she continues to wheeze from time to time. She was given albuterol to use as needed. She does not use this as he thinks that she should. She denies orthopnea or PND. LE edema is unchanged. She denies syncope or chest pain.   Past Medical History  Diagnosis Date  . MI (myocardial infarction) (HCC) 1992    at age 75; TIA treated with coumadin until Plavix initiated in 2006  . Hyperlipidemia   . HTN (hypertension)   . Hypothyroidism   . DVT (deep venous thrombosis) (HCC)     X1  . CAD (coronary artery disease)     S/P  anterior  wall myocardial infarction in '92, treated w/percutaneous transluminal coronary angioplasty of the left anterior descending. EF 45%  . PAD (peripheral artery disease) (HCC)     Right SFA occlusion, severe disease left CFA and SFA  . Carotid artery disease (HCC)   . Ischemic cardiomyopathy     a. Echo 6/17: EF 20-25%, apex appears akinetic, MAC, moderate MR, moderate LAE, mild RVE, trivial PI, PASP 47 mmHg (needs repeat with Definity contrast)     Past Surgical History  Procedure Laterality Date  . Fracture lle      '94; pinned  . Appendectomy      at hysterectomy and USO for fibroids, Yvonne. Kizzie Bailey  . Tonsillectomy    . Colonoscopy      negative; 2008, Yvonne. Verlee Monte  Bailey  . Cardiac catheterization  1992    Yvonne Bailey  . Total abdominal hysterectomy      & BSO for fibroids    Current Medications: Outpatient Prescriptions Prior to Visit  Medication Sig Dispense Refill  . albuterol (VENTOLIN HFA) 108 (90 Base) MCG/ACT inhaler Inhale 1-2 puffs into the lungs every 6 (six) hours as needed for wheezing or shortness of breath. 18 g 1  . apixaban (ELIQUIS) 5 MG TABS tablet Take 1 tablet (5 mg total) by mouth 2 (two) times daily. 180 tablet 2  . Ascorbic Acid (VITAMIN C) 100 MG tablet Take 100 mg by mouth daily.      Marland Kitchen atorvastatin (LIPITOR) 40 MG tablet Take 1 tablet (40 mg total) by  mouth daily. 90 tablet 0  . benazepril (LOTENSIN) 20 MG tablet TAKE 1 TABLET (20 MG TOTAL) DAILY. 90 tablet 2  . brimonidine (ALPHAGAN) 0.2 % ophthalmic solution PLACE ONE DROPTO THE LEFT EYE TWICE  DAILY  0  . cholecalciferol (VITAMIN D) 1000 UNITS tablet Take 1,000 Units by mouth daily.    . fish oil-omega-3 fatty acids 1000 MG capsule Take 1,000 mg by mouth daily.     Marland Kitchen latanoprost (XALATAN) 0.005 % ophthalmic solution Place 0.005 drops into both eyes every evening.    Marland Kitchen levothyroxine (SYNTHROID, LEVOTHROID) 88 MCG tablet Take 1 tablet (88 mcg total) by mouth daily. 90 tablet 3  . Multiple Vitamin (MULTIVITAMIN) tablet Take 1 tablet by mouth daily.      . TOVIAZ 8 MG TB24 tablet Take 8 mg by mouth daily.     . vitamin E 100 UNIT capsule Take 100 Units by mouth daily.      . furosemide (LASIX) 20 MG tablet Take 1 tablet (20 mg total) by mouth as directed. 2 tabs daily (40 mg ) for 5 days then decrease to 1 tab daily (20 mg) 90 tablet 3  . metoprolol succinate (TOPROL-XL) 50 MG 24 hr tablet Take 1 tablet (50 mg total) by mouth daily. Take with or immediately following a meal. 90 tablet 0  . potassium chloride (K-DUR) 10 MEQ tablet Take 1 tablet (10 mEq total) by mouth as directed. 2 tabs daily (20 meq)  for 5 days then decrease to 1 tab daily (10 meq) 90 tablet 3   No facility-administered medications prior to visit.      Allergies:   Cilostazol   Social History   Social History  . Marital Status: Married    Spouse Name: Yvonne Bailey  . Number of Children: 1  . Years of Education: college   Occupational History  . Teacher     Retired   Social History Main Topics  . Smoking status: Former Smoker    Quit date: 03/11/1990  . Smokeless tobacco: Never Used     Comment: smoked 1973- 1992, up to < 5 cigarettes  . Alcohol Use: No  . Drug Use: No  . Sexual Activity: Not Asked   Other Topics Concern  . 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 08/31/09- designated party form signed appointing, husband Yvonne Bailey; ok to leave msg on home phone [phone].      Caffeine Small Amount one cup very rare.   Right handed.   One child- [name]     Family History:  The patient's family history includes Coronary artery disease in her brother; Diabetes in her mother; Hypertension in her  mother; Stroke in her brother. There is no history of Cancer.   ROS:   Please see the history of present illness.    Review of Systems  Cardiovascular: Positive for dyspnea on exertion.  Respiratory: Positive for shortness of breath and wheezing.    All other systems reviewed and are negative.   Physical Exam:    VS:  BP 140/90 mmHg  Pulse 88  Ht 5\' 7"  (1.702 m)  Wt 218 lb 1.9 oz (98.939 kg)  BMI 34.15 kg/m2  SpO2 97%   Physical Exam  Constitutional: She is oriented to person, place, and time. She appears well-developed and well-nourished.  HENT:  Head: Normocephalic and atraumatic.  Neck: Normal range of motion. No JVD present.  Cardiovascular: Normal rate and normal heart sounds.  An irregularly irregular rhythm present.  No murmur heard. Pulmonary/Chest: Effort normal. She has no wheezes. She has no rales.  Abdominal: Soft. She exhibits no mass. There is no tenderness.  Musculoskeletal: Normal range of motion.  1+ bilat LE edema  Neurological: She is alert and oriented to person, place, and time.  Skin: Skin is warm and dry.  Psychiatric: She has a normal mood and affect.    Wt Readings from Last 3 Encounters:  08/14/15 218 lb 1.9 oz (98.939 kg)  07/25/15 219 lb (99.338 kg)  07/19/15 223 lb (101.152 kg)      Studies/Labs Reviewed:     EKG:  EKG is  ordered today.  The ekg ordered today demonstrates Atrial fibrillation, HR 95, no change from prior tracing  Recent Labs: 07/19/2015: ALT 46*; TSH 17.07* 07/25/2015: Brain Natriuretic Peptide 177.4*; Hemoglobin 13.2; Platelets 274 08/01/2015: BUN 17; Creat  0.97*; Potassium 3.7; Sodium 141   Recent Lipid Panel    Component Value Date/Time   CHOL 138 05/09/2014 0930   TRIG 71.0 05/09/2014 0930   HDL 44.30 05/09/2014 0930   CHOLHDL 3 05/09/2014 0930   VLDL 14.2 05/09/2014 0930   LDLCALC 80 05/09/2014 0930    Additional studies/ records that were reviewed today include:   Echo 6/17:  EF 20-25%, apex appears akinetic, MAC, moderate MR, moderate LAE, mild RVE, trivial PI, PASP 47 mmHg (needs repeat with Definity contrast)   Carotid US 04/07/15 bilat ICA 1-39% FU prn  ABIs 1/17 R 0.65, L 0.72  Echo 10/14 No mural apical clot, mild LVH, EF 35-40%, diff HK, MAC, mild MR, mild LAE, PASP 37 mmHg   ASSESSMENT:     1. Chronic systolic CHF (congestive heart failure) (HCC)   2. Coronary artery disease involving native coronary artery of native heart without angina pectoris   3. Cardiomyopathy, ischemic   4. Chronic atrial fibrillation (HCC)   5. Essential hypertension     PLAN:     In order of problems listed above:  1. Chronic systolic CHF - She is NYHA 2b At last visit, I changed HCTZ to Lasix. EF by echocardiogram in 10/14 was 35-40%.  Recent Echo with worsening LVF with EF 20-25%.  However, the endocardial segments were not well visualized.  Limited echo needs to be done with Definity. Her son is concerned about her wheezing and dyspnea. He had many questions about how her atrial fibrillation is contributing.   Overall,  I suspect that her dyspnea is multifactorial. It is likely related to deconditioning related to her previous stroke, CAD, CHF and atrial fibrillation. She is a prior smoker and may have a component of COPD. I reviewed her case today with Yvonne. Clifton Bailey.  -  Increase Lasix to 40 mg QD and K+ 20 mEq QD.  -  Arrange Limited Echo with contrast  -  Will go ahead and get Nuclear stress test to assess for ischemia.    -  I have recommended follow-up with primary care as well to assess for COPD.  -  FU in several weeks to  assess response to Lasix and review test results.  2. CAD - No angina. She is not on ASA as she is on Eliquis. Continue statin, beta-blocker. Will get stress testing arranged as noted due to low EF. If stress test is high risk or she has progression of symptoms, consider proceeding with cardiac catheterization.  3. Ischemic CM - Continue beta-blocker, ACE inhibitor.  Repeat echo with contrast pending. Consider changing Toprol to carvedilol and adding spironolactone if EF <35%. We'll also need to consider changing ACE inhibitor to [employer].  4. Chronic AFib - Heart rate fairly stable. CHADS2-VASc=6. Continue Eliquis.  As noted her son had several questions about her atrial fibrillation. Atrial fibrillation has been documented since 3/16.  She has moderate left atrial enlargement by recent echocardiogram. The likelihood that she would maintain NSR is low. I had a prolonged discussion with the patient and her son regarding the lack of benefit in trying to restore normal sinus rhythm. We discussed some of the aspects of AFFIRM trial.    5. HTN - BP above target.  Increase Lasix as noted.   Total time spent with patient today 45 minutes. This includes reviewing records, evaluating the patient and coordinating care. Face-to-face time >50%.   Medication Adjustments/Labs and Tests Ordered: Current medicines are reviewed at length with the patient today.  Concerns regarding medicines are outlined above.  Medication changes, Labs and Tests ordered today are outlined in the Patient Instructions noted below. Patient Instructions  Medication Instructions:  1. INCREASE LASIX TO 40 MG DAILY 2. INCREASE POTASSIUM 20 MEQ DAILY Labwork: IN 1 WEEK BMET TO BE DONE Testing/Procedures: Your physician has requested that you have a lexiscan myoview. For further information please visit [email or web address] Please follow instruction sheet, as given. Follow-Up: Germaine Ripp, PAC 2-3 WEEKS SAME DAY Yvonne. Clifton Bailey IS  IN THE OFFICE Any Other Special Instructions Will Be Listed Below (If Applicable). If you need a refill on your cardiac medications before your next appointment, please call your pharmacy.    Signed, Tereso Newcomer, PA-C  08/14/2015 5:37 PM    [facility] [address] [place], [place], [place]  [address] Phone: [phone]; Fax: [phone]

## 2015-08-14 ENCOUNTER — Telehealth: Payer: Self-pay | Admitting: Physician Assistant

## 2015-08-14 ENCOUNTER — Encounter: Payer: Self-pay | Admitting: Physician Assistant

## 2015-08-14 ENCOUNTER — Ambulatory Visit (INDEPENDENT_AMBULATORY_CARE_PROVIDER_SITE_OTHER): Payer: Commercial Managed Care - HMO | Admitting: Physician Assistant

## 2015-08-14 VITALS — BP 140/90 | HR 88 | Ht 67.0 in | Wt 218.1 lb

## 2015-08-14 DIAGNOSIS — I2510 Atherosclerotic heart disease of native coronary artery without angina pectoris: Secondary | ICD-10-CM | POA: Diagnosis not present

## 2015-08-14 DIAGNOSIS — I5022 Chronic systolic (congestive) heart failure: Secondary | ICD-10-CM

## 2015-08-14 DIAGNOSIS — I255 Ischemic cardiomyopathy: Secondary | ICD-10-CM

## 2015-08-14 DIAGNOSIS — I482 Chronic atrial fibrillation, unspecified: Secondary | ICD-10-CM

## 2015-08-14 DIAGNOSIS — I10 Essential (primary) hypertension: Secondary | ICD-10-CM

## 2015-08-14 MED ORDER — FUROSEMIDE 40 MG PO TABS: 40.0000 mg | ORAL_TABLET | Freq: Every day | ORAL | Status: DC

## 2015-08-14 MED ORDER — POTASSIUM CHLORIDE ER 20 MEQ PO TBCR: 20.0000 meq | EXTENDED_RELEASE_TABLET | Freq: Every day | ORAL | Status: DC

## 2015-08-14 MED ORDER — METOPROLOL SUCCINATE ER 50 MG PO TB24: 50.0000 mg | ORAL_TABLET | Freq: Every day | ORAL | Status: DC

## 2015-08-14 NOTE — Telephone Encounter (Signed)
° °  Yvonne Bailey is calling he is the pharmacist at [facility] on [address]   He needs clarity on the two mediations   Furosemide 40mg   Potassium 20mg

## 2015-08-14 NOTE — Patient Instructions (Addendum)
Medication Instructions:  1. INCREASE LASIX TO 40 MG DAILY 2. INCREASE POTASSIUM 20 MEQ DAILY Labwork: IN 1 WEEK BMET TO BE DONE Testing/Procedures: Your physician has requested that you have a lexiscan myoview. For further information please visit [email or web address] Please follow instruction sheet, as given. Follow-Up: SCOTT WEAVER, PAC 2-3 WEEKS SAME DAY DR. Clifton James IS IN THE OFFICE Any Other Special Instructions Will Be Listed Below (If Applicable). If you need a refill on your cardiac medications before your next appointment, please call your pharmacy.

## 2015-08-14 NOTE — Telephone Encounter (Signed)
Sent in corrected lasix and k+ to pharmacy

## 2015-08-15 ENCOUNTER — Telehealth: Payer: Self-pay | Admitting: Physician Assistant

## 2015-08-15 NOTE — Telephone Encounter (Signed)
Lmtcb to go over recommendation about Toprol Xl. I did lmom as well DPR states ok. Did ask for cb though. Med list has not yet been changed.

## 2015-08-15 NOTE — Progress Notes (Signed)
Question if HR also contributing to dyspnea. Will increase Toprol-XL to 50 mg bid to see if this helps. Tereso Newcomer, PA-C   08/15/2015 4:25 PM

## 2015-08-15 NOTE — Telephone Encounter (Signed)
Please tell patient I have reviewed her chart again.   I would like to lower her HR some more to see if this helps her breathing. Please have her increase her Toprol-XL to 50 mg Twice daily. She sees her PCP next week.  Have her send her BP and HR from that visit to me. Tereso Newcomer, PA-C   08/15/2015 4:22 PM

## 2015-08-21 ENCOUNTER — Other Ambulatory Visit (INDEPENDENT_AMBULATORY_CARE_PROVIDER_SITE_OTHER): Payer: Commercial Managed Care - HMO

## 2015-08-21 ENCOUNTER — Encounter: Payer: Self-pay | Admitting: Internal Medicine

## 2015-08-21 ENCOUNTER — Telehealth: Payer: Self-pay | Admitting: Cardiovascular Disease

## 2015-08-21 ENCOUNTER — Telehealth (HOSPITAL_COMMUNITY): Payer: Self-pay | Admitting: *Deleted

## 2015-08-21 ENCOUNTER — Ambulatory Visit (INDEPENDENT_AMBULATORY_CARE_PROVIDER_SITE_OTHER): Payer: Commercial Managed Care - HMO | Admitting: Internal Medicine

## 2015-08-21 VITALS — BP 134/86 | HR 103 | Temp 98.1°F | Resp 18 | Wt 220.0 lb

## 2015-08-21 DIAGNOSIS — R7309 Other abnormal glucose: Secondary | ICD-10-CM

## 2015-08-21 DIAGNOSIS — R600 Localized edema: Secondary | ICD-10-CM | POA: Diagnosis not present

## 2015-08-21 DIAGNOSIS — I5022 Chronic systolic (congestive) heart failure: Secondary | ICD-10-CM

## 2015-08-21 DIAGNOSIS — I10 Essential (primary) hypertension: Secondary | ICD-10-CM

## 2015-08-21 DIAGNOSIS — I482 Chronic atrial fibrillation, unspecified: Secondary | ICD-10-CM

## 2015-08-21 DIAGNOSIS — E038 Other specified hypothyroidism: Secondary | ICD-10-CM | POA: Diagnosis not present

## 2015-08-21 LAB — COMPREHENSIVE METABOLIC PANEL
ALT: 23 U/L (ref 0–35)
AST: 24 U/L (ref 0–37)
Albumin: 3.9 g/dL (ref 3.5–5.2)
Alkaline Phosphatase: 93 U/L (ref 39–117)
BUN: 16 mg/dL (ref 6–23)
CO2: 28 mEq/L (ref 19–32)
Calcium: 9 mg/dL (ref 8.4–10.5)
Chloride: 109 mEq/L (ref 96–112)
Creatinine, Ser: 1.08 mg/dL (ref 0.40–1.20)
GFR: 63.59 mL/min (ref >60.00)
Glucose, Bld: 126 mg/dL — ABNORMAL HIGH (ref 70–99)
Potassium: 4 mEq/L (ref 3.5–5.1)
Sodium: 143 mEq/L (ref 135–145)
Total Bilirubin: 1 mg/dL (ref 0.2–1.2)
Total Protein: 6.6 g/dL (ref 6.0–8.3)

## 2015-08-21 LAB — HEMOGLOBIN A1C: Hgb A1c MFr Bld: 5.6 % (ref 4.6–6.5)

## 2015-08-21 LAB — TSH: TSH: 2.54 u[IU]/mL (ref 0.35–4.50)

## 2015-08-21 MED ORDER — METOPROLOL SUCCINATE ER 50 MG PO TB24: 50.0000 mg | ORAL_TABLET | Freq: Two times a day (BID) | ORAL | Status: DC

## 2015-08-21 NOTE — Assessment & Plan Note (Signed)
Chronic On eliquis Metoprolol increased by cardio, but she has not started the increased dose yet - will start today

## 2015-08-21 NOTE — Assessment & Plan Note (Signed)
Check tsh  Titrate med dose if needed  

## 2015-08-21 NOTE — Patient Instructions (Signed)
  Test(s) ordered today. Your results will be released to MyChart (or called to you) after review, usually within 72hours after test completion. If any changes need to be made, you will be notified at that same time.   Medications reviewed and updated.  No changes recommended at this time.    Please followup in 4 months   

## 2015-08-21 NOTE — Assessment & Plan Note (Signed)
Following with cardiology Repeat echo scheduled, stress test ordered hctz changed to lasix - she feels this has helped Metoprolol increased, but she has not started the increased dose yet

## 2015-08-21 NOTE — Assessment & Plan Note (Signed)
BP well controlled today Metoprolol was increased - to start higher dose today Continue current dose of benazepril

## 2015-08-21 NOTE — Telephone Encounter (Signed)
Spoke with pt and went over medication change.   Pt will have PCP send over vitals after she is seen today. Verified pharmacy and sent in medication. Pt verbalized understanding and was in agreement with this plan.

## 2015-08-21 NOTE — Progress Notes (Signed)
Pre visit review using our clinic review tool, if applicable. No additional management support is needed unless otherwise documented below in the visit note. 

## 2015-08-21 NOTE — Telephone Encounter (Signed)
Follow-up     The pt is returning is the call from the nurse regarding the change in her medications. No other information given.

## 2015-08-21 NOTE — Progress Notes (Signed)
Subjective:    Patient ID: Yvonne Bailey, female    DOB: Nov 09, 1940, 75 y.o.   MRN: [id number]  HPI She is here for follow up.  Hypertension, CHF, Afib: she is following with cardiology and saw them recently.  Dr Yvonne Bailey increased her metoprolol and she will start that today. She is taking her medication daily. She is compliant with a low sodium diet.  She denies chest pain, palpitations,and regular headaches. Her edema is improved.  She still has shortness of breath with exertion - sometimes it feels better, sometimes worse.  She is not exercising regularly - she knows she needs to start and is thinking about restarting silver sneakers.  She does not monitor her blood pressure at home.    Hypothyroidism:  She is taking her medication daily - we adjusted her medication one month ago.  She denies any recent changes in energy or weight that are unexplained.   Dyspnea with exertion:  She still has shortness of breath when she exerts her self.  She states it is sometimes better and sometimes worse. She has tried using her inhaler and some times it helps.  (got the inhaler at urgent care).  She coughs and wheeze occasionally.      Medications and allergies reviewed with patient and updated if appropriate.  Patient Active Problem List   Diagnosis Date Noted  . Bilateral leg edema 07/19/2015  . Atrial fibrillation (HCC) 07/19/2015  . Urinary urgency 03/30/2015  . Other abnormal glucose 05/14/2013  . Other musculoskeletal symptoms referable to limbs(729.89) 12/17/2012  . 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  . Small vessel disease, cerebrovascular 12/17/2012  . POSTMENOPAUSAL SYNDROME 08/10/2008  . CAROTID BRUIT, RIGHT 08/10/2008  . Peripheral vascular disease (HCC) 06/04/2007  . Diverticulosis of large intestine 06/04/2007  . Hypothyroidism 02/24/2007  . Hyperlipidemia 02/24/2007  . Essential hypertension 02/24/2007  . DVT 01/21/2007  . Coronary atherosclerosis 10/29/2006     Current Outpatient Prescriptions on File Prior to Visit  Medication Sig Dispense Refill  . albuterol (VENTOLIN HFA) 108 (90 Base) MCG/ACT inhaler Inhale 1-2 puffs into the lungs every 6 (six) hours as needed for wheezing or shortness of breath. 18 g 1  . apixaban (ELIQUIS) 5 MG TABS tablet Take 1 tablet (5 mg total) by mouth 2 (two) times daily. 180 tablet 2  . Ascorbic Acid (VITAMIN C) 100 MG tablet Take 100 mg by mouth daily.      Marland Kitchen atorvastatin (LIPITOR) 40 MG tablet Take 1 tablet (40 mg total) by mouth daily. 90 tablet 0  . benazepril (LOTENSIN) 20 MG tablet TAKE 1 TABLET (20 MG TOTAL) DAILY. 90 tablet 2  . brimonidine (ALPHAGAN) 0.2 % ophthalmic solution PLACE ONE DROPTO THE LEFT EYE TWICE  DAILY  0  . cholecalciferol (VITAMIN D) 1000 UNITS tablet Take 1,000 Units by mouth daily.    . fish oil-omega-3 fatty acids 1000 MG capsule Take 1,000 mg by mouth daily.     . furosemide (LASIX) 40 MG tablet Take 1 tablet (40 mg total) by mouth daily. 90 tablet 3  . latanoprost (XALATAN) 0.005 % ophthalmic solution Place 0.005 drops into both eyes every evening.    Marland Kitchen levothyroxine (SYNTHROID, LEVOTHROID) 88 MCG tablet Take 1 tablet (88 mcg total) by mouth daily. 90 tablet 3  . Multiple Vitamin (MULTIVITAMIN) tablet Take 1 tablet by mouth daily.      . Potassium Chloride ER 20 MEQ TBCR Take 20 mEq by mouth daily. 90 tablet 3  . TOVIAZ  8 MG TB24 tablet Take 8 mg by mouth daily.     . vitamin E 100 UNIT capsule Take 100 Units by mouth daily.       No current facility-administered medications on file prior to visit.    Past Medical History  Diagnosis Date  . MI (myocardial infarction) (HCC) 1992    at age 43; TIA treated with coumadin until Plavix initiated in 2006  . Hyperlipidemia   . HTN (hypertension)   . Hypothyroidism   . DVT (deep venous thrombosis) (HCC)     X1  . CAD (coronary artery disease)     S/P  anterior  wall myocardial infarction in '92, treated w/percutaneous transluminal  coronary angioplasty of the left anterior descending. EF 45%  . PAD (peripheral artery disease) (HCC)     Right SFA occlusion, severe disease left CFA and SFA  . Carotid artery disease (HCC)   . Ischemic cardiomyopathy     a. Echo 6/17: EF 20-25%, apex appears akinetic, MAC, moderate MR, moderate LAE, mild RVE, trivial PI, PASP 47 mmHg (needs repeat with Definity contrast)     Past Surgical History  Procedure Laterality Date  . Fracture lle      '94; pinned  . Appendectomy      at hysterectomy and USO for fibroids, Dr. Kizzie Bailey  . Tonsillectomy    . Colonoscopy      negative; 2008, Dr. Lina Bailey  . Cardiac catheterization  1992    Dr Yvonne Bailey  . Total abdominal hysterectomy      & BSO for fibroids    Social History   Social History  . Marital Status: Married    Spouse Name: Yvonne Bailey  . Number of Children: 1  . Years of Education: college   Occupational History  . Teacher     Retired   Social History Main Topics  . Smoking status: Former Smoker    Quit date: 03/11/1990  . Smokeless tobacco: Never Used     Comment: smoked 1973- 1992, up to < 5 cigarettes  . Alcohol Use: No  . Drug Use: No  . Sexual Activity: Not on file   Other Topics Concern  . 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 08/31/09- designated party form signed appointing, husband Yvonne Bailey; ok to leave msg on home phone [phone].      Caffeine Small Amount one cup very rare.   Right handed.   One child- Yvonne Bailey    Family History  Problem Relation Age of Onset  . Diabetes Mother   . Hypertension Mother   . Stroke Brother     ?> 55  . Coronary artery disease Brother     stent in 25s  . Cancer Neg Hx     Review of Systems  Constitutional: Negative for fever.  Respiratory: Positive for cough, shortness of breath (with exertion - sometimes better, sometimes worse) and wheezing.   Cardiovascular: Positive for  leg swelling (varies - improved with lasix). Negative for chest pain and palpitations.  Gastrointestinal: Negative for nausea and abdominal pain.       No GERD  Neurological: Negative for dizziness, light-headedness and headaches.       Objective:   Filed Vitals:   08/21/15 1338  BP: 134/86  Pulse: 103  Temp: 98.1 F (36.7 C)  Resp: 18   Filed Weights   08/21/15 1338  Weight: 220 lb (99.791  kg)   Body mass index is 34.45 kg/(m^2).   Physical Exam Constitutional: Appears well-developed and well-nourished. No distress.  Neck: Neck supple. No tracheal deviation present. No thyromegaly present.  No carotid bruit. No cervical adenopathy.   Cardiovascular: Normal rate, irregular rhythm and normal heart sounds.   No murmur heard.  1+ b/l LE edema Pulmonary/Chest: Effort normal and breath sounds normal. No respiratory distress. No wheezes.        Assessment & Plan:   See Problem List for Assessment and Plan of chronic medical problems.

## 2015-08-21 NOTE — Telephone Encounter (Signed)
Left message on voicemail per DPR in reference to upcoming appointment scheduled on 08/23/15 with detailed instructions given per Myocardial Perfusion Study Information Sheet for the test. LM to arrive 15 minutes early, and that it is imperative to arrive on time for appointment to keep from having the test rescheduled. If you need to cancel or reschedule your appointment, please call the office within 24 hours of your appointment. Failure to do so may result in a cancellation of your appointment, and a $50 no show fee. Phone number given for call back for any questions. Laine Fonner J Sarabi Sockwell, RN  

## 2015-08-21 NOTE — Assessment & Plan Note (Signed)
Check a1c 

## 2015-08-21 NOTE — Telephone Encounter (Signed)
Yvonne Lecher, PA-C at 08/15/2015 4:21 PM     Status: Signed       Expand All Collapse All   Please tell patient I have reviewed her chart again.  I would like to lower her HR some more to see if this helps her breathing. Please have her increase her Toprol-XL to 50 mg Twice daily. She sees her PCP next week. Have her send her BP and HR from that visit to me. Yvonne Newcomer, PA-C  08/15/2015 4:22 PM        Spoke with pt and went over medication change.   Pt will have PCP send over vitals after she is seen today. Verified pharmacy and sent in medication. Pt verbalized understanding and was in agreement with this plan.

## 2015-08-21 NOTE — Assessment & Plan Note (Signed)
Taking lasix/potassium daily cmp today since she is getting blood work

## 2015-08-22 ENCOUNTER — Other Ambulatory Visit: Payer: Self-pay | Admitting: Physician Assistant

## 2015-08-22 DIAGNOSIS — I2510 Atherosclerotic heart disease of native coronary artery without angina pectoris: Secondary | ICD-10-CM

## 2015-08-23 ENCOUNTER — Encounter: Payer: Self-pay | Admitting: Physician Assistant

## 2015-08-23 ENCOUNTER — Ambulatory Visit (HOSPITAL_COMMUNITY): Payer: Commercial Managed Care - HMO | Attending: Cardiovascular Disease

## 2015-08-23 ENCOUNTER — Other Ambulatory Visit (INDEPENDENT_AMBULATORY_CARE_PROVIDER_SITE_OTHER): Payer: Commercial Managed Care - HMO

## 2015-08-23 DIAGNOSIS — R0609 Other forms of dyspnea: Secondary | ICD-10-CM | POA: Diagnosis not present

## 2015-08-23 DIAGNOSIS — I255 Ischemic cardiomyopathy: Secondary | ICD-10-CM | POA: Diagnosis not present

## 2015-08-23 DIAGNOSIS — I509 Heart failure, unspecified: Secondary | ICD-10-CM | POA: Diagnosis not present

## 2015-08-23 DIAGNOSIS — I739 Peripheral vascular disease, unspecified: Secondary | ICD-10-CM | POA: Insufficient documentation

## 2015-08-23 DIAGNOSIS — I5022 Chronic systolic (congestive) heart failure: Secondary | ICD-10-CM | POA: Insufficient documentation

## 2015-08-23 DIAGNOSIS — I110 Hypertensive heart disease with heart failure: Secondary | ICD-10-CM | POA: Diagnosis not present

## 2015-08-23 DIAGNOSIS — I779 Disorder of arteries and arterioles, unspecified: Secondary | ICD-10-CM | POA: Diagnosis not present

## 2015-08-23 DIAGNOSIS — R9439 Abnormal result of other cardiovascular function study: Secondary | ICD-10-CM | POA: Diagnosis not present

## 2015-08-23 LAB — BASIC METABOLIC PANEL
BUN: 18 mg/dL (ref 7–25)
CO2: 18 mmol/L — ABNORMAL LOW (ref 20–31)
Calcium: 8.6 mg/dL (ref 8.6–10.4)
Chloride: 109 mmol/L (ref 98–110)
Creat: 1.01 mg/dL — ABNORMAL HIGH (ref 0.60–0.93)
Glucose, Bld: 111 mg/dL — ABNORMAL HIGH (ref 65–99)
Potassium: 5.6 mmol/L — ABNORMAL HIGH (ref 3.5–5.3)
Sodium: 140 mmol/L (ref 135–146)

## 2015-08-23 LAB — MYOCARDIAL PERFUSION IMAGING
Peak HR: 95 {beats}/min
RATE: 0.38
Rest HR: 83 {beats}/min
SDS: 0
SRS: 15
SSS: 15
TID: 1.05

## 2015-08-23 MED ORDER — TECHNETIUM TC 99M TETROFOSMIN IV KIT
10.2000 | PACK | Freq: Once | INTRAVENOUS | Status: AC | PRN
Start: 2015-08-23 — End: 2015-08-23
  Administered 2015-08-23: 10 via INTRAVENOUS
  Filled 2015-08-23: qty 10

## 2015-08-23 MED ORDER — TECHNETIUM TC 99M TETROFOSMIN IV KIT
32.4000 | PACK | Freq: Once | INTRAVENOUS | Status: AC | PRN
  Administered 2015-08-23: 32 via INTRAVENOUS
  Filled 2015-08-23: qty 32

## 2015-08-23 MED ORDER — REGADENOSON 0.4 MG/5ML IV SOLN
0.4000 mg | Freq: Once | INTRAVENOUS | Status: AC
Start: 2015-08-23 — End: 2015-08-23
  Administered 2015-08-23: 0.4 mg via INTRAVENOUS

## 2015-08-24 ENCOUNTER — Other Ambulatory Visit (INDEPENDENT_AMBULATORY_CARE_PROVIDER_SITE_OTHER): Payer: Commercial Managed Care - HMO | Admitting: *Deleted

## 2015-08-24 ENCOUNTER — Other Ambulatory Visit: Payer: Self-pay | Admitting: *Deleted

## 2015-08-24 DIAGNOSIS — E875 Hyperkalemia: Secondary | ICD-10-CM

## 2015-08-25 LAB — BASIC METABOLIC PANEL
BUN: 17 mg/dL (ref 7–25)
CO2: 24 mmol/L (ref 20–31)
Calcium: 9.1 mg/dL (ref 8.6–10.4)
Chloride: 108 mmol/L (ref 98–110)
Creat: 1.07 mg/dL — ABNORMAL HIGH (ref 0.60–0.93)
Glucose, Bld: 87 mg/dL (ref 65–99)
Potassium: 3.7 mmol/L (ref 3.5–5.3)
Sodium: 141 mmol/L (ref 135–146)

## 2015-08-29 ENCOUNTER — Other Ambulatory Visit (HOSPITAL_COMMUNITY): Payer: Commercial Managed Care - HMO

## 2015-09-01 ENCOUNTER — Ambulatory Visit (INDEPENDENT_AMBULATORY_CARE_PROVIDER_SITE_OTHER): Payer: Commercial Managed Care - HMO | Admitting: Cardiovascular Disease

## 2015-09-01 ENCOUNTER — Encounter: Payer: Self-pay | Admitting: Cardiovascular Disease

## 2015-09-01 ENCOUNTER — Encounter: Payer: Self-pay | Admitting: *Deleted

## 2015-09-01 VITALS — BP 128/90 | HR 82 | Ht 67.0 in | Wt 215.0 lb

## 2015-09-01 DIAGNOSIS — I2510 Atherosclerotic heart disease of native coronary artery without angina pectoris: Secondary | ICD-10-CM

## 2015-09-01 DIAGNOSIS — I481 Persistent atrial fibrillation: Secondary | ICD-10-CM | POA: Diagnosis not present

## 2015-09-01 DIAGNOSIS — I5022 Chronic systolic (congestive) heart failure: Secondary | ICD-10-CM

## 2015-09-01 DIAGNOSIS — I255 Ischemic cardiomyopathy: Secondary | ICD-10-CM

## 2015-09-01 DIAGNOSIS — I4819 Other persistent atrial fibrillation: Secondary | ICD-10-CM

## 2015-09-01 LAB — CBC
HCT: 41.6 % (ref 35.0–45.0)
Hemoglobin: 13.9 g/dL (ref 11.7–15.5)
MCH: 31.7 pg (ref 27.0–33.0)
MCHC: 33.4 g/dL (ref 32.0–36.0)
MCV: 95 fL (ref 80.0–100.0)
MPV: 10.7 fL (ref 7.5–12.5)
Platelets: 268 10*3/uL (ref 140–400)
RBC: 4.38 MIL/uL (ref 3.80–5.10)
RDW: 13.4 % (ref 11.0–15.0)
WBC: 10.6 10*3/uL (ref 3.8–10.8)

## 2015-09-01 LAB — PROTIME-INR
INR: 1.2 — ABNORMAL HIGH
Prothrombin Time: 12.4 s — ABNORMAL HIGH (ref 9.0–11.5)

## 2015-09-01 LAB — BASIC METABOLIC PANEL
BUN: 17 mg/dL (ref 7–25)
CO2: 29 mmol/L (ref 20–31)
Calcium: 9.3 mg/dL (ref 8.6–10.4)
Chloride: 105 mmol/L (ref 98–110)
Creat: 1.24 mg/dL — ABNORMAL HIGH (ref 0.60–0.93)
Glucose, Bld: 105 mg/dL — ABNORMAL HIGH (ref 65–99)
Potassium: 4.6 mmol/L (ref 3.5–5.3)
Sodium: 140 mmol/L (ref 135–146)

## 2015-09-01 NOTE — Progress Notes (Signed)
Chief Complaint  Patient presents with  . Shortness of Breath   History of Present Illness: 75 yo female with history of CAD, HTN, PAD, carotid artery disease, hyperlipidemia here today for cardiac followup. She had been followed in the past by Dr. Juanda Chance. In 1992 she had an anterior MI treated with PCI. Her ejection fraction in 2007 was 45%. She also has carotid disease and had Doppler studies January 2017 which showed 0-39% stenosis bilaterally. I ordered bilateral lower ext dopplers July 2012 which showed long occlusion of the right SFA and severe stenosis left CFA and SFA with ABI of 0.5 on the right and 0.63 on the left. ABI January 2017with 0.65 on right and 0.72 on the left. She had increased lower extremity swelling on 10 mg of Norvasc so this was lowered to 5 mg daily. I saw her in the office 05/23/14 and she was in atrial fibrillation. Eliquis was started for anti-coagulation. Toprol was started for rate control. She was seen in primary care with increased dyspnea in May 2017 and then seen in our office by Tereso Newcomer, PA-C. She was treated with prednisone, bronchodilators and azithromycin in primary care. CXR with small bilateral pleural effusions. Lasix was started. Echo with LVEF=20-25%. , moderate MR, moderate LAE, PA 47 mm Hg. Nuclear stress test with anterior scar, no ischemia.   She is here today for follow up. She tells me today that she only has leg pain if she walks long distances. No rest pain or ulcerations. No chest pain.  She does endorse dyspnea with exertion, minimal improvement on Lasix. No awareness of palpitations. No bleeding on Eliquis. She is here today to discuss cath given fall in LVEF and abnormal stress test.   Primary Care Physician: Pincus Sanes, MD   Past Medical History  Diagnosis Date  . MI (myocardial infarction) (HCC) 1992    at age 52; TIA treated with coumadin until Plavix initiated in 2006  . Hyperlipidemia   . HTN (hypertension)   .  Hypothyroidism   . DVT (deep venous thrombosis) (HCC)     X1  . CAD (coronary artery disease)     S/P  anterior  wall myocardial infarction in '92, treated w/percutaneous transluminal coronary angioplasty of the left anterior descending. EF 45%  . PAD (peripheral artery disease) (HCC)     Right SFA occlusion, severe disease left CFA and SFA  . Carotid artery disease (HCC)   . Ischemic cardiomyopathy     a. Echo 6/17: EF 20-25%, apex appears akinetic, MAC, moderate MR, moderate LAE, mild RVE, trivial PI, PASP 47 mmHg (needs repeat with Definity contrast)   . History of nuclear stress test     a. Myoview 6/17: EF 20-25%, mid anteroseptal, apical anterior, apical septal, apical inferior, apical lateral and apical scar, no ischemia, intermediate risk    Past Surgical History  Procedure Laterality Date  . Fracture lle      '94; pinned  . Appendectomy      at hysterectomy and USO for fibroids, Dr. Kizzie Bane  . Tonsillectomy    . Colonoscopy      negative; 2008, Dr. Lina Sar  . Cardiac catheterization  1992    Dr Charlies Constable  . Total abdominal hysterectomy      & BSO for fibroids    Current Outpatient Prescriptions  Medication Sig Dispense Refill  . albuterol (VENTOLIN HFA) 108 (90 Base) MCG/ACT inhaler Inhale 1-2 puffs into the lungs every 6 (six) hours as  needed for wheezing or shortness of breath. 18 g 1  . apixaban (ELIQUIS) 5 MG TABS tablet Take 1 tablet (5 mg total) by mouth 2 (two) times daily. 180 tablet 2  . Ascorbic Acid (VITAMIN C) 100 MG tablet Take 100 mg by mouth daily.      Marland Kitchen atorvastatin (LIPITOR) 40 MG tablet Take 1 tablet (40 mg total) by mouth daily. 90 tablet 0  . benazepril (LOTENSIN) 20 MG tablet TAKE 1 TABLET (20 MG TOTAL) DAILY. 90 tablet 2  . brimonidine (ALPHAGAN) 0.2 % ophthalmic solution PLACE ONE DROPTO THE LEFT EYE TWICE  DAILY  0  . cholecalciferol (VITAMIN D) 1000 UNITS tablet Take 1,000 Units by mouth daily.    . fish oil-omega-3 fatty acids 1000 MG  capsule Take 1,000 mg by mouth daily.     . furosemide (LASIX) 40 MG tablet Take 1 tablet (40 mg total) by mouth daily. 90 tablet 3  . latanoprost (XALATAN) 0.005 % ophthalmic solution Place 0.005 drops into both eyes every evening.    Marland Kitchen levothyroxine (SYNTHROID, LEVOTHROID) 88 MCG tablet Take 1 tablet (88 mcg total) by mouth daily. 90 tablet 3  . metoprolol succinate (TOPROL-XL) 50 MG 24 hr tablet Take 1 tablet (50 mg total) by mouth 2 (two) times daily. Take with or immediately following a meal. 180 tablet 1  . Multiple Vitamin (MULTIVITAMIN) tablet Take 1 tablet by mouth daily.      . Potassium Chloride ER 20 MEQ TBCR Take 20 mEq by mouth daily. 90 tablet 3  . TOVIAZ 8 MG TB24 tablet Take 8 mg by mouth daily.     . vitamin E 100 UNIT capsule Take 100 Units by mouth daily.       No current facility-administered medications for this visit.    Allergies  Allergen Reactions  . Cilostazol Other (See Comments)     Pletal :" head felt funny"    Social History   Social History  . Marital Status: Married    Spouse Name: Fayrene Fearing  . Number of Children: 1  . Years of Education: college   Occupational History  . Teacher     Retired   Social History Main Topics  . Smoking status: Former Smoker    Quit date: 03/11/1990  . Smokeless tobacco: Never Used     Comment: smoked 1973- 1992, up to < 5 cigarettes  . Alcohol Use: No  . Drug Use: No  . Sexual Activity: Not on file   Other Topics Concern  . 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 08/31/09- designated party form signed appointing, husband Anwyn Kriegel; ok to leave msg on home phone [phone].      Caffeine Small Amount one cup very rare.   Right handed.   One child- [name]    Family History  Problem Relation Age of Onset  . Diabetes Mother   . Hypertension Mother   . Stroke Brother     ?> 55  . Coronary artery disease Brother     stent in  15s  . Cancer Neg Hx     Review of Systems:  As stated in the HPI and otherwise negative.   BP 128/90 mmHg  Pulse 82  Ht  (1.702 m)  Wt 215 lb (97.523 kg)  BMI 33.67 kg/m2  SpO2 97%  Physical Examination: General: Well developed, well nourished, NAD HEENT: OP clear, mucus membranes  moist SKIN: warm, dry. No rashes. Neuro: No focal deficits Musculoskeletal: Muscle strength 5/5 all ext Psychiatric: Mood and affect normal Neck: No JVD, no carotid bruits, no thyromegaly, no lymphadenopathy. Lungs:Clear bilaterally, no wheezes, rhonci, crackles Cardiovascular: Regular rate and rhythm. No murmurs, gallops or rubs. Abdomen:Soft. Bowel sounds present. Non-tender.  Extremities: No lower extremity edema. Pulses are non-palpable in the bilateral DP/PT.  Echo 08/10/15: Left ventricle: The cavity size was moderately dilated. Systolic  function was severely reduced. The estimated ejection fraction  was in the range of 20% to 25%. Images were inadequate for LV  wall motion assessment but the apex does appear akinetic. There  appears to be a false tendon in the mid LV cavity of no clinical  significance. Doppler parameters are consistent with high  ventricular filling pressure. - Aortic valve: Trileaflet; mildly thickened, mildly calcified  leaflets. - Mitral valve: Calcified annulus. There was moderate  regurgitation. Valve area by pressure half-time: 1.95 cm^2. Valve  area by continuity equation (using LVOT flow): 0.92 cm^2. - Left atrium: The atrium was moderately dilated. - Right ventricle: The cavity size was mildly dilated. Wall  thickness was normal. - Pulmonic valve: There was trivial regurgitation. - Pulmonary arteries: PA peak pressure: 47 mm Hg (S). - Impressions: Overall LV function appears severely reduced but  endocardial segments are not adequately visualized to comment.  Recommend limited echo with definity contrast agent for more  accurate  assessment.  Impressions:  - Overall LV function appears severely reduced but endocardial  segments are not adequately visualized to comment. Recommend  limited echo with definity contrast agent for more accurate  assessment. The right ventricular systolic pressure was increased  consistent with moderate pulmonary hypertension.  Nuclear stress test June 14,2017:  There was no ST segment deviation noted during stress. Atrial fibrillation. Non-gated study.  Defect 1: There is a medium defect of severe severity present in the mid anteroseptal, apical anterior, apical septal, apical inferior, apical lateral and apex location.  Findings consistent with prior myocardial infarction. Echocardiogram ejection fraction 20-25% with apical akinesis.  This is an intermediate risk study based upon old apical infarction. No ischemia identified.  EKG:  EKG is ordered today. The ekg ordered today demonstrates Atrial fibrillation, rate 90 bpm. Poor R wave progression precordial leads  Recent Labs: 07/25/2015: Brain Natriuretic Peptide 177.4*; Hemoglobin 13.2; Platelets 274 08/21/2015: ALT 23; TSH 2.54 08/24/2015: BUN 17; Creat 1.07*; Potassium 3.7; Sodium 141   Lipid Panel    Component Value Date/Time   CHOL 138 05/09/2014 0930   TRIG 71.0 05/09/2014 0930   HDL 44.30 05/09/2014 0930   CHOLHDL 3 05/09/2014 0930   VLDL 14.2 05/09/2014 0930   LDLCALC 80 05/09/2014 0930     Wt Readings from Last 3 Encounters:  09/01/15 215 lb (97.523 kg)  08/23/15 218 lb (98.884 kg)  08/21/15 220 lb (99.791 kg)     Other studies Reviewed: Additional studies/ records that were reviewed today include: . Review of the above records demonstrates:   Assessment and Plan:   1. Chronic systolic CHF: Volume status is ok on Lasix. LVEF is reduced from prior. Nuclear stress test with large pefusion defect but no ischemia. Overall, I suspect that her dyspnea is multifactorial. It is likely related to  deconditioning related to her previous stroke, CAD, CHF and atrial fibrillation. She is a prior smoker and may have a component of COPD.  2. CAD: No angina but she has had worsened dyspnea over the last few months and  now her nuclear stress test shows a large anterior scar. This may all be related to her known CAD but will need to arrange cardiac cath to exclude progression of CAD, make sure there are no lesions that we can intervene on to improve LV function. She is not on ASA as she is on Eliquis. Continue statin, beta-blocker.Cath next week at [facility]. Labs today. Hold Eliquis 2 days before cath.   3. Ischemic CM: Continue beta-blocker, ACE inhibitor. Will consider changing Toprol to carvedilol and adding spironolactone. We'll also consider changing ACE inhibitor to [employer].  4. Chronic AFib: HR is controlled. CHADS2-VASc=6. Continue Eliquis. Beta blocker for rate control.   5. HTN: BP controlled.   Current medicines are reviewed at length with the patient today.  The patient does not have concerns regarding medicines.  The following changes have been made:  Stop ASA  Labs/ tests ordered today include:   Orders Placed This Encounter  Procedures  . Basic Metabolic Panel (BMET)  . CBC  . INR/PT    Disposition:   FU with me in 1 months  Signed, [name], MD 09/01/2015 12:51 PM    [facility] [address] [place], [place], [place]  [address] Phone: [phone]; Fax: [phone]

## 2015-09-01 NOTE — Patient Instructions (Signed)
Medication Instructions:  Your physician recommends that you continue on your current medications as directed. Please refer to the Current Medication list given to you today. See catheterization instruction sheet.   Labwork: Lab work to be done today--BMP, CBC, PT  Testing/Procedures: Your physician has requested that you have a cardiac catheterization. Cardiac catheterization is used to diagnose and/or treat various heart conditions. Doctors may recommend this procedure for a number of different reasons. The most common reason is to evaluate chest pain. Chest pain can be a symptom of coronary artery disease (CAD), and cardiac catheterization can show whether plaque is narrowing or blocking your heart's arteries. This procedure is also used to evaluate the valves, as well as measure the blood flow and oxygen levels in different parts of your heart. For further information please visit [email or web address] Please follow instruction sheet, as given. Scheduled for June 28,2017    Follow-Up: Your physician recommends that you schedule a follow-up appointment in: about one month with Lilian Coma    Any Other Special Instructions Will Be Listed Below (If Applicable).     If you need a refill on your cardiac medications before your next appointment, please call your pharmacy.

## 2015-09-04 ENCOUNTER — Ambulatory Visit: Payer: Commercial Managed Care - HMO | Admitting: Physician Assistant

## 2015-09-06 ENCOUNTER — Inpatient Hospital Stay (HOSPITAL_COMMUNITY)
Admission: AD | Admit: 2015-09-06 | Discharge: 2015-09-08 | DRG: 246 | Disposition: A | Discharge status: Home / Self Care | Payer: Commercial Managed Care - HMO | Source: Ambulatory Visit | Attending: Cardiovascular Disease | Admitting: Cardiovascular Disease

## 2015-09-06 ENCOUNTER — Ambulatory Visit (HOSPITAL_COMMUNITY): Payer: Commercial Managed Care - HMO

## 2015-09-06 ENCOUNTER — Other Ambulatory Visit: Payer: Self-pay | Admitting: *Deleted

## 2015-09-06 ENCOUNTER — Encounter (HOSPITAL_COMMUNITY): Payer: Self-pay | Admitting: *Deleted

## 2015-09-06 ENCOUNTER — Encounter (HOSPITAL_COMMUNITY)
Admission: AD | Disposition: A | Discharge status: Home / Self Care | Payer: Self-pay | Source: Ambulatory Visit | Attending: Cardiovascular Disease

## 2015-09-06 DIAGNOSIS — I2510 Atherosclerotic heart disease of native coronary artery without angina pectoris: Secondary | ICD-10-CM

## 2015-09-06 DIAGNOSIS — Z86718 Personal history of other venous thrombosis and embolism: Secondary | ICD-10-CM | POA: Diagnosis not present

## 2015-09-06 DIAGNOSIS — I6502 Occlusion and stenosis of left vertebral artery: Secondary | ICD-10-CM | POA: Diagnosis present

## 2015-09-06 DIAGNOSIS — I200 Unstable angina: Secondary | ICD-10-CM | POA: Diagnosis not present

## 2015-09-06 DIAGNOSIS — E785 Hyperlipidemia, unspecified: Secondary | ICD-10-CM | POA: Diagnosis not present

## 2015-09-06 DIAGNOSIS — I5022 Chronic systolic (congestive) heart failure: Secondary | ICD-10-CM | POA: Diagnosis present

## 2015-09-06 DIAGNOSIS — Z87891 Personal history of nicotine dependence: Secondary | ICD-10-CM

## 2015-09-06 DIAGNOSIS — I2582 Chronic total occlusion of coronary artery: Secondary | ICD-10-CM | POA: Diagnosis present

## 2015-09-06 DIAGNOSIS — I25110 Atherosclerotic heart disease of native coronary artery with unstable angina pectoris: Secondary | ICD-10-CM | POA: Diagnosis not present

## 2015-09-06 DIAGNOSIS — I70213 Atherosclerosis of native arteries of extremities with intermittent claudication, bilateral legs: Secondary | ICD-10-CM | POA: Diagnosis present

## 2015-09-06 DIAGNOSIS — I255 Ischemic cardiomyopathy: Secondary | ICD-10-CM | POA: Insufficient documentation

## 2015-09-06 DIAGNOSIS — I110 Hypertensive heart disease with heart failure: Secondary | ICD-10-CM | POA: Diagnosis not present

## 2015-09-06 DIAGNOSIS — I480 Paroxysmal atrial fibrillation: Secondary | ICD-10-CM | POA: Diagnosis present

## 2015-09-06 DIAGNOSIS — Z8249 Family history of ischemic heart disease and other diseases of the circulatory system: Secondary | ICD-10-CM | POA: Diagnosis not present

## 2015-09-06 DIAGNOSIS — I252 Old myocardial infarction: Secondary | ICD-10-CM

## 2015-09-06 DIAGNOSIS — Z888 Allergy status to other drugs, medicaments and biological substances status: Secondary | ICD-10-CM | POA: Diagnosis not present

## 2015-09-06 DIAGNOSIS — I482 Chronic atrial fibrillation: Secondary | ICD-10-CM | POA: Diagnosis not present

## 2015-09-06 DIAGNOSIS — Z7901 Long term (current) use of anticoagulants: Secondary | ICD-10-CM

## 2015-09-06 DIAGNOSIS — R0602 Shortness of breath: Secondary | ICD-10-CM | POA: Diagnosis present

## 2015-09-06 DIAGNOSIS — E039 Hypothyroidism, unspecified: Secondary | ICD-10-CM | POA: Diagnosis not present

## 2015-09-06 DIAGNOSIS — Z79899 Other long term (current) drug therapy: Secondary | ICD-10-CM | POA: Diagnosis not present

## 2015-09-06 DIAGNOSIS — Z955 Presence of coronary angioplasty implant and graft: Secondary | ICD-10-CM

## 2015-09-06 DIAGNOSIS — I6523 Occlusion and stenosis of bilateral carotid arteries: Secondary | ICD-10-CM | POA: Diagnosis present

## 2015-09-06 DIAGNOSIS — I10 Essential (primary) hypertension: Secondary | ICD-10-CM | POA: Diagnosis present

## 2015-09-06 DIAGNOSIS — Z8673 Personal history of transient ischemic attack (TIA), and cerebral infarction without residual deficits: Secondary | ICD-10-CM

## 2015-09-06 DIAGNOSIS — Z01818 Encounter for other preprocedural examination: Secondary | ICD-10-CM

## 2015-09-06 DIAGNOSIS — J90 Pleural effusion, not elsewhere classified: Secondary | ICD-10-CM | POA: Diagnosis not present

## 2015-09-06 DIAGNOSIS — I272 Other secondary pulmonary hypertension: Secondary | ICD-10-CM | POA: Diagnosis present

## 2015-09-06 DIAGNOSIS — I4891 Unspecified atrial fibrillation: Secondary | ICD-10-CM | POA: Diagnosis present

## 2015-09-06 DIAGNOSIS — I70203 Unspecified atherosclerosis of native arteries of extremities, bilateral legs: Secondary | ICD-10-CM | POA: Diagnosis not present

## 2015-09-06 HISTORY — PX: CARDIAC CATHETERIZATION: SHX172

## 2015-09-06 LAB — PULMONARY FUNCTION TEST
DL/VA % pred: 80 %
DL/VA: 3.96 ml/min/mmHg/L
DLCO cor % pred: 44 %
DLCO cor: 11.48 ml/min/mmHg
DLCO unc % pred: 45 %
DLCO unc: 11.65 ml/min/mmHg
FEF 25-75 Post: 0.94 L/sec
FEF 25-75 Pre: 0.92 L/sec
FEF2575-%Change-Post: 1 %
FEF2575-%Pred-Post: 59 %
FEF2575-%Pred-Pre: 58 %
FEV1-%Change-Post: 1 %
FEV1-%Pred-Post: 64 %
FEV1-%Pred-Pre: 62 %
FEV1-Post: 1.15 L
FEV1-Pre: 1.13 L
FEV1FVC-%Change-Post: 9 %
FEV1FVC-%Pred-Pre: 102 %
FEV6-%Change-Post: -6 %
FEV6-%Pred-Post: 60 %
FEV6-%Pred-Pre: 65 %
FEV6-Post: 1.35 L
FEV6-Pre: 1.45 L
FEV6FVC-%Pred-Post: 104 %
FEV6FVC-%Pred-Pre: 104 %
FVC-%Change-Post: -6 %
FVC-%Pred-Post: 58 %
FVC-%Pred-Pre: 62 %
FVC-Post: 1.35 L
FVC-Pre: 1.45 L
Post FEV1/FVC ratio: 85 %
Post FEV6/FVC ratio: 100 %
Pre FEV1/FVC ratio: 78 %
Pre FEV6/FVC Ratio: 100 %
RV % pred: 65 %
RV: 1.54 L
TLC % pred: 60 %
TLC: 3.14 L

## 2015-09-06 LAB — POCT I-STAT 3, VENOUS BLOOD GAS (G3P V)
Acid-base deficit: 4 mmol/L — ABNORMAL HIGH (ref 0.0–2.0)
Bicarbonate: 21.8 mEq/L (ref 20.0–24.0)
O2 Saturation: 57 %
TCO2: 23 mmol/L (ref 0–100)
pCO2, Ven: 39.3 mmHg — ABNORMAL LOW (ref 45.0–50.0)
pH, Ven: 7.352 — ABNORMAL HIGH (ref 7.250–7.300)
pO2, Ven: 31 mmHg (ref 31.0–45.0)

## 2015-09-06 LAB — POCT I-STAT 3, ART BLOOD GAS (G3+)
Acid-base deficit: 4 mmol/L — ABNORMAL HIGH (ref 0.0–2.0)
Bicarbonate: 21.3 mEq/L (ref 20.0–24.0)
O2 Saturation: 94 %
TCO2: 22 mmol/L (ref 0–100)
pCO2 arterial: 37.1 mmHg (ref 35.0–45.0)
pH, Arterial: 7.367 (ref 7.350–7.450)
pO2, Arterial: 72 mmHg — ABNORMAL LOW (ref 80.0–100.0)

## 2015-09-06 LAB — POCT ACTIVATED CLOTTING TIME: Activated Clotting Time: 175 seconds

## 2015-09-06 SURGERY — RIGHT/LEFT HEART CATH AND CORONARY ANGIOGRAPHY
Anesthesia: LOCAL

## 2015-09-06 MED ORDER — BRIMONIDINE TARTRATE 0.2 % OP SOLN
1.0000 [drp] | Freq: Two times a day (BID) | OPHTHALMIC | Status: DC
  Administered 2015-09-06 – 2015-09-08 (×5): 1 [drp] via OPHTHALMIC
  Filled 2015-09-06 (×2): qty 5

## 2015-09-06 MED ORDER — BENAZEPRIL HCL 20 MG PO TABS
20.0000 mg | ORAL_TABLET | Freq: Every day | ORAL | Status: DC
  Administered 2015-09-07 – 2015-09-08 (×2): 20 mg via ORAL
  Filled 2015-09-06 (×2): qty 1

## 2015-09-06 MED ORDER — ONDANSETRON HCL 4 MG/2ML IJ SOLN: 4.0000 mg | Freq: Four times a day (QID) | INTRAMUSCULAR | Status: DC | PRN

## 2015-09-06 MED ORDER — LIDOCAINE HCL (PF) 1 % IJ SOLN
INTRAMUSCULAR | Status: DC | PRN
  Administered 2015-09-06: 2 mL
  Administered 2015-09-06: 3 mL

## 2015-09-06 MED ORDER — ATORVASTATIN CALCIUM 40 MG PO TABS
40.0000 mg | ORAL_TABLET | Freq: Every evening | ORAL | Status: DC
  Administered 2015-09-07: 40 mg via ORAL
  Filled 2015-09-06 (×2): qty 1

## 2015-09-06 MED ORDER — LEVOTHYROXINE SODIUM 88 MCG PO TABS
88.0000 ug | ORAL_TABLET | Freq: Every day | ORAL | Status: DC
  Administered 2015-09-07 – 2015-09-08 (×2): 88 ug via ORAL
  Filled 2015-09-06 (×2): qty 1

## 2015-09-06 MED ORDER — HEPARIN (PORCINE) IN NACL 2-0.9 UNIT/ML-% IJ SOLN
INTRAMUSCULAR | Status: DC | PRN
  Administered 2015-09-06: 1500 mL

## 2015-09-06 MED ORDER — LATANOPROST 0.005 % OP SOLN
1.0000 [drp] | Freq: Every evening | OPHTHALMIC | Status: DC
  Administered 2015-09-06 – 2015-09-07 (×2): 1 [drp] via OPHTHALMIC
  Filled 2015-09-06 (×2): qty 2.5

## 2015-09-06 MED ORDER — HEPARIN SODIUM (PORCINE) 1000 UNIT/ML IJ SOLN
INTRAMUSCULAR | Status: AC
  Filled 2015-09-06: qty 1

## 2015-09-06 MED ORDER — VERAPAMIL HCL 2.5 MG/ML IV SOLN
INTRAVENOUS | Status: DC | PRN
  Administered 2015-09-06: 10 mL via INTRA_ARTERIAL

## 2015-09-06 MED ORDER — MIDAZOLAM HCL 2 MG/2ML IJ SOLN
INTRAMUSCULAR | Status: AC
  Filled 2015-09-06: qty 2

## 2015-09-06 MED ORDER — FENTANYL CITRATE (PF) 100 MCG/2ML IJ SOLN
INTRAMUSCULAR | Status: AC
  Filled 2015-09-06: qty 2

## 2015-09-06 MED ORDER — HEPARIN SODIUM (PORCINE) 1000 UNIT/ML IJ SOLN
INTRAMUSCULAR | Status: DC | PRN
  Administered 2015-09-06: 5000 [IU] via INTRAVENOUS

## 2015-09-06 MED ORDER — SODIUM CHLORIDE 0.9 % IV SOLN: 250.0000 mL | INTRAVENOUS | Status: DC | PRN

## 2015-09-06 MED ORDER — SODIUM CHLORIDE 0.9 % IV SOLN
INTRAVENOUS | Status: DC
  Administered 2015-09-06: 09:00:00 via INTRAVENOUS

## 2015-09-06 MED ORDER — FUROSEMIDE 40 MG PO TABS
40.0000 mg | ORAL_TABLET | Freq: Every day | ORAL | Status: DC
  Administered 2015-09-06: 40 mg via ORAL
  Filled 2015-09-06 (×2): qty 1

## 2015-09-06 MED ORDER — FESOTERODINE FUMARATE ER 8 MG PO TB24
8.0000 mg | ORAL_TABLET | Freq: Every day | ORAL | Status: DC
  Administered 2015-09-07 – 2015-09-08 (×2): 8 mg via ORAL
  Filled 2015-09-06 (×3): qty 1

## 2015-09-06 MED ORDER — ALBUTEROL SULFATE (2.5 MG/3ML) 0.083% IN NEBU
3.0000 mL | INHALATION_SOLUTION | Freq: Four times a day (QID) | RESPIRATORY_TRACT | Status: DC | PRN
  Administered 2015-09-06: 3 mL via RESPIRATORY_TRACT
  Filled 2015-09-06: qty 3

## 2015-09-06 MED ORDER — SODIUM CHLORIDE 0.9 % IV SOLN
250.0000 mL | INTRAVENOUS | Status: DC | PRN
  Administered 2015-09-07: 250 mL via INTRAVENOUS

## 2015-09-06 MED ORDER — ACETAMINOPHEN 325 MG PO TABS: 650.0000 mg | ORAL_TABLET | ORAL | Status: DC | PRN

## 2015-09-06 MED ORDER — ALBUTEROL SULFATE (2.5 MG/3ML) 0.083% IN NEBU
2.5000 mg | INHALATION_SOLUTION | Freq: Once | RESPIRATORY_TRACT | Status: AC
  Administered 2015-09-06: 2.5 mg via RESPIRATORY_TRACT

## 2015-09-06 MED ORDER — ADULT MULTIVITAMIN W/MINERALS CH
1.0000 | ORAL_TABLET | Freq: Every day | ORAL | Status: DC
  Administered 2015-09-06 – 2015-09-08 (×3): 1 via ORAL
  Filled 2015-09-06 (×4): qty 1

## 2015-09-06 MED ORDER — ASPIRIN 81 MG PO CHEW
CHEWABLE_TABLET | ORAL | Status: AC
  Filled 2015-09-06: qty 1

## 2015-09-06 MED ORDER — SODIUM CHLORIDE 0.9 % IV SOLN
INTRAVENOUS | Status: DC
  Administered 2015-09-06: 11:00:00 via INTRAVENOUS

## 2015-09-06 MED ORDER — METOPROLOL SUCCINATE ER 50 MG PO TB24
50.0000 mg | ORAL_TABLET | Freq: Two times a day (BID) | ORAL | Status: DC
  Administered 2015-09-06 – 2015-09-08 (×4): 50 mg via ORAL
  Filled 2015-09-06 (×4): qty 1

## 2015-09-06 MED ORDER — IOPAMIDOL (ISOVUE-370) INJECTION 76%
INTRAVENOUS | Status: DC | PRN
  Administered 2015-09-06: 95 mL via INTRAVENOUS

## 2015-09-06 MED ORDER — SODIUM CHLORIDE 0.9% FLUSH: 3.0000 mL | Freq: Two times a day (BID) | INTRAVENOUS | Status: DC

## 2015-09-06 MED ORDER — HEPARIN (PORCINE) IN NACL 100-0.45 UNIT/ML-% IJ SOLN
1050.0000 [IU]/h | INTRAMUSCULAR | Status: DC
  Administered 2015-09-06: 1150 [IU]/h via INTRAVENOUS
  Filled 2015-09-06: qty 250

## 2015-09-06 MED ORDER — POTASSIUM CHLORIDE CRYS ER 20 MEQ PO TBCR
20.0000 meq | EXTENDED_RELEASE_TABLET | Freq: Every day | ORAL | Status: DC
  Administered 2015-09-06 – 2015-09-08 (×3): 20 meq via ORAL
  Filled 2015-09-06 (×3): qty 1

## 2015-09-06 MED ORDER — SODIUM CHLORIDE 0.9% FLUSH: 3.0000 mL | INTRAVENOUS | Status: DC | PRN

## 2015-09-06 MED ORDER — FENTANYL CITRATE (PF) 100 MCG/2ML IJ SOLN
INTRAMUSCULAR | Status: DC | PRN
  Administered 2015-09-06: 25 ug via INTRAVENOUS

## 2015-09-06 MED ORDER — LIDOCAINE HCL (PF) 1 % IJ SOLN
INTRAMUSCULAR | Status: AC
  Filled 2015-09-06: qty 30

## 2015-09-06 MED ORDER — HEPARIN (PORCINE) IN NACL 2-0.9 UNIT/ML-% IJ SOLN
INTRAMUSCULAR | Status: AC
  Filled 2015-09-06: qty 1500

## 2015-09-06 MED ORDER — MIDAZOLAM HCL 2 MG/2ML IJ SOLN
INTRAMUSCULAR | Status: DC | PRN
  Administered 2015-09-06: 1 mg via INTRAVENOUS

## 2015-09-06 MED ORDER — ASPIRIN 81 MG PO CHEW
81.0000 mg | CHEWABLE_TABLET | ORAL | Status: AC
  Administered 2015-09-06: 81 mg via ORAL

## 2015-09-06 SURGICAL SUPPLY — 15 items
CATH BALLN WEDGE 5F 110CM (CATHETERS) ×2 IMPLANT
CATH INFINITI 5 FR JL3.5 (CATHETERS) ×2 IMPLANT
CATH INFINITI 5FR ANG PIGTAIL (CATHETERS) ×2 IMPLANT
CATH INFINITI JR4 5F (CATHETERS) ×2 IMPLANT
DEVICE RAD COMP TR BAND LRG (VASCULAR PRODUCTS) ×2 IMPLANT
GLIDESHEATH SLEND SS 6F .021 (SHEATH) ×2 IMPLANT
KIT HEART LEFT (KITS) ×2 IMPLANT
PACK CARDIAC CATHETERIZATION (CUSTOM PROCEDURE TRAY) ×2 IMPLANT
SHEATH FAST CATH BRACH 5F 5CM (SHEATH) ×2 IMPLANT
SYR MEDRAD MARK V 150ML (SYRINGE) ×2 IMPLANT
TRANSDUCER W/STOPCOCK (MISCELLANEOUS) ×2 IMPLANT
TUBING CIL FLEX 10 FLL-RA (TUBING) ×2 IMPLANT
WIRE COUGAR XT STRL 190CM (WIRE) ×2 IMPLANT
WIRE HI TORQ VERSACORE-J 145CM (WIRE) ×2 IMPLANT
WIRE SAFE-T 1.5MM-J .035X260CM (WIRE) ×2 IMPLANT

## 2015-09-06 NOTE — Progress Notes (Signed)
CARDIAC REHAB PHASE I   Pt in bed, very pleasant, son at bedside. Offered to ambulate with pt, pt declines at this time. Pt states she would like to rest right now, awaiting surgery consult. Will follow up tomorrow for ambulation, possible pre-op education if decision is to proceed with surgery. Pt verbalized understanding. Pt in bed, call bell within reach.  Joylene Grapes, RN, BSN 09/06/2015 1:32 PM

## 2015-09-06 NOTE — Research (Signed)
LEADERS FREE II Informed Consent   Subject Name: Yvonne Bailey  Subject met inclusion and exclusion criteria.  The informed consent form, study requirements and expectations were reviewed with the subject and questions and concerns were addressed prior to the signing of the consent form.  The subject verbalized understanding of the trail requirements.  The subject agreed to participate in the LEADERS FREE II trial and signed the informed consent.  The informed consent was obtained prior to performance of any protocol-specific procedures for the subject.  A copy of the signed informed consent was given to the subject and a copy was placed in the subject's medical record.  Hedrick,[name] 09/06/2015, 8:57 AM

## 2015-09-06 NOTE — H&P (View-Only) (Signed)
Chief Complaint  Patient presents with  . Shortness of Breath   History of Present Illness: 75 yo female with history of CAD, HTN, PAD, carotid artery disease, hyperlipidemia here today for cardiac followup. She had been followed in the past by Dr. Juanda Chance. In 1992 she had an anterior MI treated with PCI. Her ejection fraction in 2007 was 45%. She also has carotid disease and had Doppler studies January 2017 which showed 0-39% stenosis bilaterally. I ordered bilateral lower ext dopplers July 2012 which showed long occlusion of the right SFA and severe stenosis left CFA and SFA with ABI of 0.5 on the right and 0.63 on the left. ABI January 2017with 0.65 on right and 0.72 on the left. She had increased lower extremity swelling on 10 mg of Norvasc so this was lowered to 5 mg daily. I saw her in the office 05/23/14 and she was in atrial fibrillation. Eliquis was started for anti-coagulation. Toprol was started for rate control. She was seen in primary care with increased dyspnea in May 2017 and then seen in our office by Tereso Newcomer, PA-C. She was treated with prednisone, bronchodilators and azithromycin in primary care. CXR with small bilateral pleural effusions. Lasix was started. Echo with LVEF=20-25%. , moderate MR, moderate LAE, PA 47 mm Hg. Nuclear stress test with anterior scar, no ischemia.   She is here today for follow up. She tells me today that she only has leg pain if she walks long distances. No rest pain or ulcerations. No chest pain.  She does endorse dyspnea with exertion, minimal improvement on Lasix. No awareness of palpitations. No bleeding on Eliquis. She is here today to discuss cath given fall in LVEF and abnormal stress test.   Primary Care Physician: Pincus Sanes, MD   Past Medical History  Diagnosis Date  . MI (myocardial infarction) (HCC) 1992    at age 52; TIA treated with coumadin until Plavix initiated in 2006  . Hyperlipidemia   . HTN (hypertension)   .  Hypothyroidism   . DVT (deep venous thrombosis) (HCC)     X1  . CAD (coronary artery disease)     S/P  anterior  wall myocardial infarction in '92, treated w/percutaneous transluminal coronary angioplasty of the left anterior descending. EF 45%  . PAD (peripheral artery disease) (HCC)     Right SFA occlusion, severe disease left CFA and SFA  . Carotid artery disease (HCC)   . Ischemic cardiomyopathy     a. Echo 6/17: EF 20-25%, apex appears akinetic, MAC, moderate MR, moderate LAE, mild RVE, trivial PI, PASP 47 mmHg (needs repeat with Definity contrast)   . History of nuclear stress test     a. Myoview 6/17: EF 20-25%, mid anteroseptal, apical anterior, apical septal, apical inferior, apical lateral and apical scar, no ischemia, intermediate risk    Past Surgical History  Procedure Laterality Date  . Fracture lle      '94; pinned  . Appendectomy      at hysterectomy and USO for fibroids, Dr. Kizzie Bane  . Tonsillectomy    . Colonoscopy      negative; 2008, Dr. Lina Sar  . Cardiac catheterization  1992    Dr Charlies Constable  . Total abdominal hysterectomy      & BSO for fibroids    Current Outpatient Prescriptions  Medication Sig Dispense Refill  . albuterol (VENTOLIN HFA) 108 (90 Base) MCG/ACT inhaler Inhale 1-2 puffs into the lungs every 6 (six) hours as  needed for wheezing or shortness of breath. 18 g 1  . apixaban (ELIQUIS) 5 MG TABS tablet Take 1 tablet (5 mg total) by mouth 2 (two) times daily. 180 tablet 2  . Ascorbic Acid (VITAMIN C) 100 MG tablet Take 100 mg by mouth daily.      Marland Kitchen atorvastatin (LIPITOR) 40 MG tablet Take 1 tablet (40 mg total) by mouth daily. 90 tablet 0  . benazepril (LOTENSIN) 20 MG tablet TAKE 1 TABLET (20 MG TOTAL) DAILY. 90 tablet 2  . brimonidine (ALPHAGAN) 0.2 % ophthalmic solution PLACE ONE DROPTO THE LEFT EYE TWICE  DAILY  0  . cholecalciferol (VITAMIN D) 1000 UNITS tablet Take 1,000 Units by mouth daily.    . fish oil-omega-3 fatty acids 1000 MG  capsule Take 1,000 mg by mouth daily.     . furosemide (LASIX) 40 MG tablet Take 1 tablet (40 mg total) by mouth daily. 90 tablet 3  . latanoprost (XALATAN) 0.005 % ophthalmic solution Place 0.005 drops into both eyes every evening.    Marland Kitchen levothyroxine (SYNTHROID, LEVOTHROID) 88 MCG tablet Take 1 tablet (88 mcg total) by mouth daily. 90 tablet 3  . metoprolol succinate (TOPROL-XL) 50 MG 24 hr tablet Take 1 tablet (50 mg total) by mouth 2 (two) times daily. Take with or immediately following a meal. 180 tablet 1  . Multiple Vitamin (MULTIVITAMIN) tablet Take 1 tablet by mouth daily.      . Potassium Chloride ER 20 MEQ TBCR Take 20 mEq by mouth daily. 90 tablet 3  . TOVIAZ 8 MG TB24 tablet Take 8 mg by mouth daily.     . vitamin E 100 UNIT capsule Take 100 Units by mouth daily.       No current facility-administered medications for this visit.    Allergies  Allergen Reactions  . Cilostazol Other (See Comments)     Pletal :" head felt funny"    Social History   Social History  . Marital Status: Married    Spouse Name: Fayrene Fearing  . Number of Children: 1  . Years of Education: college   Occupational History  . Teacher     Retired   Social History Main Topics  . Smoking status: Former Smoker    Quit date: 03/11/1990  . Smokeless tobacco: Never Used     Comment: smoked 1973- 1992, up to < 5 cigarettes  . Alcohol Use: No  . Drug Use: No  . Sexual Activity: Not on file   Other Topics Concern  . 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 08/31/09- designated party form signed appointing, husband Anwyn Kriegel; ok to leave msg on home phone [phone].      Caffeine Small Amount one cup very rare.   Right handed.   One child- [name]    Family History  Problem Relation Age of Onset  . Diabetes Mother   . Hypertension Mother   . Stroke Brother     ?> 55  . Coronary artery disease Brother     stent in  15s  . Cancer Neg Hx     Review of Systems:  As stated in the HPI and otherwise negative.   BP 128/90 mmHg  Pulse 82  Ht  (1.702 m)  Wt 215 lb (97.523 kg)  BMI 33.67 kg/m2  SpO2 97%  Physical Examination: General: Well developed, well nourished, NAD HEENT: OP clear, mucus membranes  moist SKIN: warm, dry. No rashes. Neuro: No focal deficits Musculoskeletal: Muscle strength 5/5 all ext Psychiatric: Mood and affect normal Neck: No JVD, no carotid bruits, no thyromegaly, no lymphadenopathy. Lungs:Clear bilaterally, no wheezes, rhonci, crackles Cardiovascular: Regular rate and rhythm. No murmurs, gallops or rubs. Abdomen:Soft. Bowel sounds present. Non-tender.  Extremities: No lower extremity edema. Pulses are non-palpable in the bilateral DP/PT.  Echo 08/10/15: Left ventricle: The cavity size was moderately dilated. Systolic  function was severely reduced. The estimated ejection fraction  was in the range of 20% to 25%. Images were inadequate for LV  wall motion assessment but the apex does appear akinetic. There  appears to be a false tendon in the mid LV cavity of no clinical  significance. Doppler parameters are consistent with high  ventricular filling pressure. - Aortic valve: Trileaflet; mildly thickened, mildly calcified  leaflets. - Mitral valve: Calcified annulus. There was moderate  regurgitation. Valve area by pressure half-time: 1.95 cm^2. Valve  area by continuity equation (using LVOT flow): 0.92 cm^2. - Left atrium: The atrium was moderately dilated. - Right ventricle: The cavity size was mildly dilated. Wall  thickness was normal. - Pulmonic valve: There was trivial regurgitation. - Pulmonary arteries: PA peak pressure: 47 mm Hg (S). - Impressions: Overall LV function appears severely reduced but  endocardial segments are not adequately visualized to comment.  Recommend limited echo with definity contrast agent for more  accurate  assessment.  Impressions:  - Overall LV function appears severely reduced but endocardial  segments are not adequately visualized to comment. Recommend  limited echo with definity contrast agent for more accurate  assessment. The right ventricular systolic pressure was increased  consistent with moderate pulmonary hypertension.  Nuclear stress test June 14,2017:  There was no ST segment deviation noted during stress. Atrial fibrillation. Non-gated study.  Defect 1: There is a medium defect of severe severity present in the mid anteroseptal, apical anterior, apical septal, apical inferior, apical lateral and apex location.  Findings consistent with prior myocardial infarction. Echocardiogram ejection fraction 20-25% with apical akinesis.  This is an intermediate risk study based upon old apical infarction. No ischemia identified.  EKG:  EKG is ordered today. The ekg ordered today demonstrates Atrial fibrillation, rate 90 bpm. Poor R wave progression precordial leads  Recent Labs: 07/25/2015: Brain Natriuretic Peptide 177.4*; Hemoglobin 13.2; Platelets 274 08/21/2015: ALT 23; TSH 2.54 08/24/2015: BUN 17; Creat 1.07*; Potassium 3.7; Sodium 141   Lipid Panel    Component Value Date/Time   CHOL 138 05/09/2014 0930   TRIG 71.0 05/09/2014 0930   HDL 44.30 05/09/2014 0930   CHOLHDL 3 05/09/2014 0930   VLDL 14.2 05/09/2014 0930   LDLCALC 80 05/09/2014 0930     Wt Readings from Last 3 Encounters:  09/01/15 215 lb (97.523 kg)  08/23/15 218 lb (98.884 kg)  08/21/15 220 lb (99.791 kg)     Other studies Reviewed: Additional studies/ records that were reviewed today include: . Review of the above records demonstrates:   Assessment and Plan:   1. Chronic systolic CHF: Volume status is ok on Lasix. LVEF is reduced from prior. Nuclear stress test with large pefusion defect but no ischemia. Overall, I suspect that her dyspnea is multifactorial. It is likely related to  deconditioning related to her previous stroke, CAD, CHF and atrial fibrillation. She is a prior smoker and may have a component of COPD.  2. CAD: No angina but she has had worsened dyspnea over the last few months and  now her nuclear stress test shows a large anterior scar. This may all be related to her known CAD but will need to arrange cardiac cath to exclude progression of CAD, make sure there are no lesions that we can intervene on to improve LV function. She is not on ASA as she is on Eliquis. Continue statin, beta-blocker.Cath next week at [facility]. Labs today. Hold Eliquis 2 days before cath.   3. Ischemic CM: Continue beta-blocker, ACE inhibitor. Will consider changing Toprol to carvedilol and adding spironolactone. We'll also consider changing ACE inhibitor to [employer].  4. Chronic AFib: HR is controlled. CHADS2-VASc=6. Continue Eliquis. Beta blocker for rate control.   5. HTN: BP controlled.   Current medicines are reviewed at length with the patient today.  The patient does not have concerns regarding medicines.  The following changes have been made:  Stop ASA  Labs/ tests ordered today include:   Orders Placed This Encounter  Procedures  . Basic Metabolic Panel (BMET)  . CBC  . INR/PT    Disposition:   FU with me in 1 months  Signed, [name], MD 09/01/2015 12:51 PM    [facility] [address] [place], [place], [place]  [address] Phone: [phone]; Fax: [phone]

## 2015-09-06 NOTE — Interval H&P Note (Signed)
History and Physical Interval Note:  09/06/2015 9:22 AM  Yvonne Bailey  has presented today for cardiac cath with the diagnosis of unstable angina, cardiomyopathy, CAD.  The various methods of treatment have been discussed with the patient and family. After consideration of risks, benefits and other options for treatment, the patient has consented to  Procedure(s): Right/Left Heart Cath and Coronary Angiography (N/A) as a surgical intervention .  The patient's history has been reviewed, patient examined, no change in status, stable for surgery.  I have reviewed the patient's chart and labs.  Questions were answered to the patient's satisfaction.    Cath Lab Visit (complete for each Cath Lab visit)  Clinical Evaluation Leading to the Procedure:   ACS: No.  Non-ACS:    Anginal Classification: CCS II  Anti-ischemic medical therapy: Minimal Therapy (1 class of medications)  Non-Invasive Test Results: Intermediate-risk stress test findings: cardiac mortality 1-3%/year  Prior CABG: No previous CABG         [name]

## 2015-09-06 NOTE — Consult Note (Signed)
[address]       [place] [address]             [phone]        DALMA PANCHAL [facility] Medical Record #[id number] Date of Birth: 20-Nov-1940  Referring: Dr. Clifton James, MD Primary Care: Pincus Sanes, MD  Chief Complaint: Shortness of breath. Multivessel coronary artery disease.   History of Present Illness:     This is a 75 year old female with history of CAD, HTN, PAD, carotid artery disease, hyperlipidemia here today for cardiac followup. She had been followed in the past by Dr. Juanda Chance. In 1992, she had an anterior MI treated with PCI. Her ejection fraction in 2007 was 45%. Latest Doppler study January 2017 showed 0-39% stenosis bilaterally. Regarding PAD, in July 2012 she had a long occlusion of the right SFA and severe stenosis left CFA and SFA with ABI of 0.5 on the right and 0.63 on the left. ABI January 2017 showed 0.65 on right and 0.72 on the left. She had increased lower extremity swelling on 10 mg of Norvasc so this was lowered to 5 mg daily. Dr. Clifton James saw her in the office 05/23/14 and she was in atrial fibrillation. Eliquis was started for anti-coagulation. She was started on Toprol  for rate control. She was seen in primary care with increased dyspnea in May 2017 and then seen in our office by Tereso Newcomer, PA-C. She was treated with prednisone, bronchodilators and azithromycin in primary care. CXR with small bilateral pleural effusions. Lasix was started. Echo showed LVEF=20-25%. , moderate MR, moderate LAE, PA 47 mm Hg. Nuclear stress test with anterior scar, no ischemia.   She was seen by Dr. Clifton James in the office on 09/01/2015 to discuss cardiac catheterization given the fall in LVEF and abnormal stress test. She tells me today that she only has leg pain if she walks long distances. She denies rest pain or ulcerations. She has never had chest pain. She does endorse dyspnea with exertion, minimal improvement on Lasix. No awareness of  palpitations. No bleeding episdoes on Eliquis. Her last dose of Eliquis was Sunday night.  She had a cardiac catheterization done today by Dr. Clifton James. She was found to have severe three vessel coronary artery disease and LVEF 25%. Currently, she is no NAD, denies chest pain or shortness of breath. Vital signs are: HR in the 70's and she is in a fib,last BP 129/79, RR 18, oxygen saturation 100% on room air.  Current Activity/ Functional Status: Patient is independent with mobility/ambulation, transfers, ADL's, IADL's.   Zubrod Score: At the time of surgery this patient's most appropriate activity status/level should be described as:     0    Normal activity, no symptoms     1    Restricted in physical strenuous activity but ambulatory, able to do out light work     2    Ambulatory and capable of self care, unable to do work activities, up and about                 more than 50%  Of the time                                3    Only limited self care, in bed greater than 50% of waking hours     4    Completely disabled,  no self care, confined to bed or chair []     5    Moribund  Past Medical History  Diagnosis Date  . MI (myocardial infarction) (HCC) 1992    at age 77; TIA treated with coumadin until Plavix initiated in 2006  . Hyperlipidemia   . HTN (hypertension)   . Hypothyroidism   . DVT (deep venous thrombosis) (HCC)     X1  . CAD (coronary artery disease)     S/P  anterior  wall myocardial infarction in '92, treated w/percutaneous transluminal coronary angioplasty of the left anterior descending. EF 45%  . PAD (peripheral artery disease) (HCC)     Right SFA occlusion, severe disease left CFA and SFA  . Carotid artery disease (HCC)   . Ischemic cardiomyopathy     a. Echo 6/17: EF 20-25%, apex appears akinetic, MAC, moderate MR, moderate LAE, mild RVE, trivial PI, PASP 47 mmHg (needs repeat with Definity contrast)   . History of nuclear stress test     a. Myoview 6/17:  EF 20-25%, mid anteroseptal, apical anterior, apical septal, apical inferior, apical lateral and apical scar, no ischemia, intermediate risk    Past Surgical History  Procedure Laterality Date  . Fracture lle      '94; pinned  . Appendectomy      at hysterectomy and USO for fibroids, Dr. Kizzie Bane  . Tonsillectomy    . Colonoscopy      negative; 2008, Dr. Lina Sar  . Cardiac catheterization  1992    Dr Charlies Constable  . Total abdominal hysterectomy      & BSO for fibroids  . Cardiac catheterization N/A 09/06/2015    Procedure: Right/Left Heart Cath and Coronary Angiography;  Surgeon: Kathleene Hazel, MD;  Location: [facility] INVASIVE CV LAB;  Service: Cardiovascular;  Laterality: N/A;    Social History   Social History  . Marital Status: Married    Spouse Name: Fayrene Fearing but he is deceased  . Number of Children: 1  . Years of Education: college   Occupational History  . Teacher     Retired   Social History Main Topics  . Smoking status: Former Smoker    Quit date: 03/11/1990  . Smokeless tobacco: Never Used     Comment: smoked 1973- 1992, up to < 5 cigarettes  . Alcohol Use: No  . Drug Use: No  . 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       Caffeine Small Amount one cup very rare.   Right handed.   One child- [name]    Allergies  Allergen Reactions  . Cilostazol Other (See Comments)     Pletal :" head felt funny"    Current Facility-Administered Medications  Medication Dose Route Frequency Provider Last Rate Last Dose  . 0.9 %  sodium chloride infusion  250 mL Intravenous PRN Kathleene Hazel, MD      . 0.9 %  sodium chloride infusion   Intravenous Continuous Kathleene Hazel, MD 50 mL/hr at 09/06/15 1107    . acetaminophen (TYLENOL) tablet 650 mg  650 mg Oral Q4H PRN Kathleene Hazel, MD      . albuterol (PROVENTIL) (2.5 MG/3ML) 0.083% nebulizer  solution 3 mL  3 mL Inhalation Q6H PRN Kathleene Hazel, MD      . aspirin 81 MG chewable tablet           .  atorvastatin (LIPITOR) tablet 40 mg  40 mg Oral QPM Kathleene Hazel, MD      . Melene Muller ON 09/07/2015] benazepril (LOTENSIN) tablet 20 mg  20 mg Oral Daily Kathleene Hazel, MD      . brimonidine (ALPHAGAN) 0.2 % ophthalmic solution 1 drop  1 drop Left Eye BID Kathleene Hazel, MD   1 drop at 09/06/15 1231  . fesoterodine (TOVIAZ) tablet 8 mg  8 mg Oral Daily Kathleene Hazel, MD   8 mg at 09/06/15 1215  . furosemide (LASIX) tablet 40 mg  40 mg Oral Daily Kathleene Hazel, MD   40 mg at 09/06/15 1231  . latanoprost (XALATAN) 0.005 % ophthalmic solution 1 drop  1 drop Both Eyes QPM Kathleene Hazel, MD      . Melene Muller ON 09/07/2015] levothyroxine (SYNTHROID, LEVOTHROID) tablet 88 mcg  88 mcg Oral QAC breakfast Kathleene Hazel, MD      . metoprolol succinate (TOPROL-XL) 24 hr tablet 50 mg  50 mg Oral BID Kathleene Hazel, MD      . multivitamin with minerals tablet 1 tablet  1 tablet Oral Daily Kathleene Hazel, MD   1 tablet at 09/06/15 1231  . ondansetron (ZOFRAN) injection 4 mg  4 mg Intravenous Q6H PRN Kathleene Hazel, MD      . potassium chloride SA (K-DUR,KLOR-CON) CR tablet 20 mEq  20 mEq Oral Daily Kathleene Hazel, MD   20 mEq at 09/06/15 1231  . sodium chloride flush (NS) 0.9 % injection 3 mL  3 mL Intravenous Q12H Kathleene Hazel, MD   3 mL at 09/06/15 1430  . sodium chloride flush (NS) 0.9 % injection 3 mL  3 mL Intravenous PRN Kathleene Hazel, MD        Prescriptions prior to admission  Medication Sig Dispense Refill Last Dose  . albuterol (VENTOLIN HFA) 108 (90 Base) MCG/ACT inhaler Inhale 1-2 puffs into the lungs every 6 (six) hours as needed for wheezing or shortness of breath. 18 g 1 09/04/2015  . apixaban (ELIQUIS) 5 MG TABS tablet Take 1 tablet (5 mg total) by mouth 2 (two) times daily. 180  tablet 2 09/03/2015 at 2300  . Ascorbic Acid (VITAMIN C) 100 MG tablet Take 100 mg by mouth daily.     Past Week at Unknown time  . atorvastatin (LIPITOR) 40 MG tablet Take 1 tablet (40 mg total) by mouth daily. 90 tablet 0 09/05/2015 at Unknown time  . benazepril (LOTENSIN) 20 MG tablet TAKE 1 TABLET (20 MG TOTAL) DAILY. 90 tablet 2 09/06/2015 at Unknown time  . brimonidine (ALPHAGAN) 0.2 % ophthalmic solution PLACE ONE DROPTO THE LEFT EYE TWICE  DAILY  0 09/05/2015 at Unknown time  . furosemide (LASIX) 40 MG tablet Take 1 tablet (40 mg total) by mouth daily. 90 tablet 3 09/05/2015 at Unknown time  . latanoprost (XALATAN) 0.005 % ophthalmic solution Place 0.005 drops into both eyes every evening.   09/05/2015 at Unknown time  . levothyroxine (SYNTHROID, LEVOTHROID) 88 MCG tablet Take 1 tablet (88 mcg total) by mouth daily. 90 tablet 3 09/06/2015 at 0630  . metoprolol succinate (TOPROL-XL) 50 MG 24 hr tablet Take 1 tablet (50 mg total) by mouth 2 (two) times daily. Take with or immediately following a meal. 180 tablet 1 09/06/2015 at Unknown time  . Potassium Chloride ER 20 MEQ TBCR Take 20 mEq by mouth daily. 90 tablet 3 09/06/2015 at Unknown time  . TOVIAZ  8 MG TB24 tablet Take 8 mg by mouth daily.    09/05/2015 at Unknown time  . cholecalciferol (VITAMIN D) 1000 UNITS tablet Take 1,000 Units by mouth daily.   More than a month at Unknown time  . fish oil-omega-3 fatty acids 1000 MG capsule Take 1,000 mg by mouth daily.    More than a month at Unknown time  . Multiple Vitamin (MULTIVITAMIN) tablet Take 1 tablet by mouth daily.     More than a month at Unknown time  . vitamin E 100 UNIT capsule Take 100 Units by mouth daily.     More than a month at Unknown time    Family History  Problem Relation Age of Onset  . Diabetes Mother   . Hypertension Mother   . Stroke Brother     ?> 55  . Coronary artery disease Brother     stent in 67s  . Cancer Neg Hx    Review of Systems:     Cardiac Review of  Systems: Y or N  Chest Pain [   N ]  Resting SOB [ N  ] Exertional SOB  [ Y ]  Pedal Edema [  Y ]    Syncope  [ N ]   Presyncope [ N  ]  General Review of Systems: [Y] = yes [N  ]=no Constitional: anorexia [ N ]; fatigue [ Y ]; nausea Klaus.Mock  ]; night sweats [ N ]; fever [ N ]; or chills Klaus.Mock  ]                                                         Eye : blurred vision Klaus.Mock  ]; diplopia [ N  ];Amaurosis fugax[ N ]; Resp: cough [ N];  wheezing[ N ];  hemoptysis[N  ];  GI:  vomiting[ N ];  dysphagia[ N ]; melena[N  ];  hematochezia [ N ];  GU: kidney stones [  ]; hematuria[N  ];   dysuria [ N ];               Skin: rash, swelling[ N ];, hair loss[  N];  or itching[ N ]; Musculosketetal: myalgias[ N ];   Heme/Lymph: bruising[ N ];  bleeding[ N ];  anemia[  ];  Neuro: TIA[ Y ]; vertigo[ N ];  seizures[ N ];   difficulty walking[has PAD/intermittent claudication ];  Psych:depression[ N ]; anxiety[ N ];  Endocrine: diabetes[ N ];  thyroid dysfunction[ N ];    Physical Exam: BP 129/79 mmHg  Pulse 96  Temp(Src) 98.3 F (36.8 C) (Oral)  Resp 18  Ht 5\' 7"  (1.702 m)  Wt 215 lb (97.523 kg)  BMI 33.67 kg/m2  SpO2 100%   General appearance: alert, cooperative and no distress Head: Normocephalic, without obvious abnormality, atraumatic Neck: no carotid bruit and supple, symmetrical, trachea midline Resp: clear to auscultation bilaterally Cardio: Irregular rate and rhythm, S1, S2 normal, no murmur, click, rub or gallop GI: soft, non-tender; bowel sounds normal; no masses,  no organomegaly Extremities: Lower extremity edema. Well healed scar left ankle. Non palpable DP/PT. Neurologic: Grossly normal  Diagnostic Studies & Laboratory data:     Recent Radiology Findings:   No results found.   I have independently reviewed the above radiologic studies.  Recent Lab Findings:  Lab Results  Component Value Date   WBC 10.6 09/01/2015   HGB 13.9 09/01/2015   HCT 41.6 09/01/2015   PLT 268 09/01/2015    GLUCOSE 105* 09/01/2015   CHOL 138 05/09/2014   TRIG 71.0 05/09/2014   HDL 44.30 05/09/2014   LDLCALC 80 05/09/2014   ALT 23 08/21/2015   AST 24 08/21/2015   NA 140 09/01/2015   K 4.6 09/01/2015   CL 105 09/01/2015   CREATININE 1.24* 09/01/2015   BUN 17 09/01/2015   CO2 29 09/01/2015   TSH 2.54 08/21/2015   INR 1.2* 09/01/2015   HGBA1C 5.6 08/21/2015   Assessment / Plan:      1. Coronary artery disease-history of anterior MI treated with PCI back in 1992. Previously, had been on Coumadin and then Plavix for TIA. Dignosed with a fib in March of this year and put on Eliquis. On Heparin drip. Dr. Donata Clay to determine whether or not a surgical candidate. 2. Ischemic cardiomyopathy-LVEF 25% by catheterization done today. 3. Mitral regurgitaiton-Echo done 08/10/2015 showed LVEF 20-25%, moderate MR 3. Hypertension-on Toprol XL 50 mg bid and Benazepril 20 mg daily. 4. Hyperlipidemia-on Lipitor 40 mg at hs 5. PAD (peripheral artery disease)-will need to check ABIs.     I  spent 15 minutes counseling the patient face to face and 50% or more the  time was spent in counseling and coordination of care. The total time spent in the appointment was 50 minutes.    Doree Fudge PA-C  09/06/2015 12:38 PM   Patient examined, cardiac cath and echocardiogram personnally reviewed 75 yo AA female admitted in CHF found to have ischemic CM with severe LV dysfunction EF .20-.25 with PA pressure 55/30, co-ox 57% , LVEDP > 25 and mod-severe ischemic MR She would not survive CABG- MV repair and  I would rec PCI of LAD,Circ and medical Rx of MR Discussed with patient and son  P Donata Clay MD

## 2015-09-06 NOTE — Progress Notes (Signed)
ANTICOAGULATION CONSULT NOTE - Initial Consult  Pharmacy Consult for Heparin Indication: h/o afib, CVA  Allergies  Allergen Reactions  . Cilostazol Other (See Comments)     Pletal :" head felt funny"    Patient Measurements: Height: 5\' 7"  (170.2 cm) Weight: 215 lb (97.523 kg) IBW/kg (Calculated) : 61.6 Heparin Dosing Weight:  83 kg  Vital Signs: Temp: 98.3 F (36.8 C) (06/28 1124) Temp Source: Oral (06/28 0844) BP: 129/79 mmHg (06/28 1124) Pulse Rate: 96 (06/28 1124)  Labs: No results for input(s): HGB, HCT, PLT, APTT, LABPROT, INR, HEPARINUNFRC, HEPRLOWMOCWT, CREATININE, CKTOTAL, CKMB, TROPONINI in the last 72 hours.  Estimated Creatinine Clearance: 47 mL/min (by C-G formula based on Cr of 1.24).   Medical History: Past Medical History  Diagnosis Date  . MI (myocardial infarction) (HCC) 1992    at age 74; TIA treated with coumadin until Plavix initiated in 2006  . Hyperlipidemia   . HTN (hypertension)   . Hypothyroidism   . DVT (deep venous thrombosis) (HCC)     X1  . CAD (coronary artery disease)     S/P  anterior  wall myocardial infarction in '92, treated w/percutaneous transluminal coronary angioplasty of the left anterior descending. EF 45%  . PAD (peripheral artery disease) (HCC)     Right SFA occlusion, severe disease left CFA and SFA  . Carotid artery disease (HCC)   . Ischemic cardiomyopathy     a. Echo 6/17: EF 20-25%, apex appears akinetic, MAC, moderate MR, moderate LAE, mild RVE, trivial PI, PASP 47 mmHg (needs repeat with Definity contrast)   . History of nuclear stress test     a. Myoview 6/17: EF 20-25%, mid anteroseptal, apical anterior, apical septal, apical inferior, apical lateral and apical scar, no ischemia, intermediate risk    Assessment: 75 y/o F with known CAD on Eliquis PTA for afib and h/o CVA presents for cath.  PMH: CAD, HTN, PAD, carotid artery disease, hyperlipidemia, DVT,   6/28 Cath: Severe 3VCAD, Severe LV dysfuction 25%,  pulmonary HTN  Anticoagulation: Afib/CVA. CHADS2-VASc=6. Eliquis last dose 6/25 for planned cath. Post- cath resume IV heparin for severe 3VCAD. Baseline Hgb and platelets WNL.  Goal of Therapy:  Heparin level 0.3-0.7 units/ml Monitor platelets by anticoagulation protocol: Yes   Plan:  At 1800, start IV heparin at 1150 units/hr Check heparin level and aPTT 6-8 hrs after heparin starts.  Daily HL and CBC   Gael Londo S. Merilynn Finland, PharmD, BCPS Clinical Staff Pharmacist Pager [phone]  Misty Stanley Stillinger 09/06/2015,1:06 PM

## 2015-09-07 ENCOUNTER — Inpatient Hospital Stay (HOSPITAL_COMMUNITY): Payer: Commercial Managed Care - HMO

## 2015-09-07 ENCOUNTER — Encounter (HOSPITAL_COMMUNITY)
Admission: AD | Disposition: A | Discharge status: Home / Self Care | Payer: Self-pay | Source: Ambulatory Visit | Attending: Cardiovascular Disease

## 2015-09-07 ENCOUNTER — Encounter (HOSPITAL_COMMUNITY): Payer: Commercial Managed Care - HMO

## 2015-09-07 DIAGNOSIS — I4891 Unspecified atrial fibrillation: Secondary | ICD-10-CM

## 2015-09-07 DIAGNOSIS — I255 Ischemic cardiomyopathy: Secondary | ICD-10-CM

## 2015-09-07 DIAGNOSIS — I25110 Atherosclerotic heart disease of native coronary artery with unstable angina pectoris: Secondary | ICD-10-CM | POA: Diagnosis not present

## 2015-09-07 DIAGNOSIS — I200 Unstable angina: Secondary | ICD-10-CM | POA: Diagnosis not present

## 2015-09-07 HISTORY — PX: CARDIAC CATHETERIZATION: SHX172

## 2015-09-07 LAB — CBC
HCT: 39 % (ref 36.0–46.0)
Hemoglobin: 12.6 g/dL (ref 12.0–15.0)
MCH: 31 pg (ref 26.0–34.0)
MCHC: 32.3 g/dL (ref 30.0–36.0)
MCV: 96.1 fL (ref 78.0–100.0)
Platelets: 218 10*3/uL (ref 150–400)
RBC: 4.06 MIL/uL (ref 3.87–5.11)
RDW: 13.4 % (ref 11.5–15.5)
WBC: 8.9 10*3/uL (ref 4.0–10.5)

## 2015-09-07 LAB — HEPARIN LEVEL (UNFRACTIONATED)
Heparin Unfractionated: 0.41 IU/mL (ref 0.30–0.70)
Heparin Unfractionated: 0.71 IU/mL — ABNORMAL HIGH (ref 0.30–0.70)
Heparin Unfractionated: 0.78 IU/mL — ABNORMAL HIGH (ref 0.30–0.70)

## 2015-09-07 LAB — BASIC METABOLIC PANEL
Anion gap: 7 (ref 5–15)
BUN: 10 mg/dL (ref 6–20)
CO2: 25 mmol/L (ref 22–32)
Calcium: 8.7 mg/dL — ABNORMAL LOW (ref 8.9–10.3)
Chloride: 111 mmol/L (ref 101–111)
Creatinine, Ser: 1.01 mg/dL — ABNORMAL HIGH (ref 0.44–1.00)
GFR calc Af Amer: >60 mL/min (ref >60)
GFR calc non Af Amer: 53 mL/min — ABNORMAL LOW (ref >60)
Glucose, Bld: 102 mg/dL — ABNORMAL HIGH (ref 65–99)
Potassium: 4.4 mmol/L (ref 3.5–5.1)
Sodium: 143 mmol/L (ref 135–145)

## 2015-09-07 LAB — APTT: aPTT: 89 seconds — ABNORMAL HIGH (ref 24–37)

## 2015-09-07 LAB — POCT ACTIVATED CLOTTING TIME: Activated Clotting Time: 477 seconds

## 2015-09-07 SURGERY — CORONARY STENT INTERVENTION

## 2015-09-07 MED ORDER — HYDRALAZINE HCL 20 MG/ML IJ SOLN
INTRAMUSCULAR | Status: AC
  Filled 2015-09-07: qty 1

## 2015-09-07 MED ORDER — ASPIRIN 81 MG PO CHEW
81.0000 mg | CHEWABLE_TABLET | ORAL | Status: AC
  Administered 2015-09-07: 81 mg via ORAL
  Filled 2015-09-07: qty 1

## 2015-09-07 MED ORDER — SODIUM CHLORIDE 0.9 % IV SOLN: INTRAVENOUS | Status: AC

## 2015-09-07 MED ORDER — NITROGLYCERIN 1 MG/10 ML FOR IR/CATH LAB
INTRA_ARTERIAL | Status: AC
  Filled 2015-09-07: qty 10

## 2015-09-07 MED ORDER — HEPARIN (PORCINE) IN NACL 2-0.9 UNIT/ML-% IJ SOLN
INTRAMUSCULAR | Status: AC
  Filled 2015-09-07: qty 1000

## 2015-09-07 MED ORDER — FENTANYL CITRATE (PF) 100 MCG/2ML IJ SOLN
INTRAMUSCULAR | Status: DC | PRN
  Administered 2015-09-07 (×2): 25 ug via INTRAVENOUS

## 2015-09-07 MED ORDER — HEPARIN (PORCINE) IN NACL 2-0.9 UNIT/ML-% IJ SOLN
INTRAMUSCULAR | Status: AC
  Filled 2015-09-07: qty 500

## 2015-09-07 MED ORDER — CLOPIDOGREL BISULFATE 75 MG PO TABS
600.0000 mg | ORAL_TABLET | Freq: Once | ORAL | Status: AC
  Administered 2015-09-07: 600 mg via ORAL
  Filled 2015-09-07: qty 8

## 2015-09-07 MED ORDER — MIDAZOLAM HCL 2 MG/2ML IJ SOLN
INTRAMUSCULAR | Status: AC
  Filled 2015-09-07: qty 2

## 2015-09-07 MED ORDER — SODIUM CHLORIDE 0.9% FLUSH: 3.0000 mL | INTRAVENOUS | Status: DC | PRN

## 2015-09-07 MED ORDER — BIVALIRUDIN BOLUS VIA INFUSION - CUPID
INTRAVENOUS | Status: DC | PRN
Start: 2015-09-07 — End: 2015-09-07
  Administered 2015-09-07: 73.125 mg via INTRAVENOUS

## 2015-09-07 MED ORDER — BIVALIRUDIN 250 MG IV SOLR
INTRAVENOUS | Status: AC
  Filled 2015-09-07: qty 250

## 2015-09-07 MED ORDER — IOPAMIDOL (ISOVUE-370) INJECTION 76%
INTRAVENOUS | Status: AC
  Filled 2015-09-07: qty 100

## 2015-09-07 MED ORDER — FUROSEMIDE 10 MG/ML IJ SOLN
40.0000 mg | Freq: Two times a day (BID) | INTRAMUSCULAR | Status: DC
  Administered 2015-09-07: 40 mg via INTRAVENOUS
  Filled 2015-09-07: qty 4

## 2015-09-07 MED ORDER — SODIUM CHLORIDE 0.9 % IV SOLN: 250.0000 mL | INTRAVENOUS | Status: DC | PRN

## 2015-09-07 MED ORDER — FUROSEMIDE 10 MG/ML IJ SOLN
INTRAMUSCULAR | Status: AC
  Filled 2015-09-07: qty 4

## 2015-09-07 MED ORDER — LIDOCAINE HCL (PF) 1 % IJ SOLN
INTRAMUSCULAR | Status: DC | PRN
  Administered 2015-09-07: 2 mL

## 2015-09-07 MED ORDER — HEPARIN (PORCINE) IN NACL 2-0.9 UNIT/ML-% IJ SOLN
INTRAMUSCULAR | Status: DC | PRN
  Administered 2015-09-07: 1500 mL

## 2015-09-07 MED ORDER — FUROSEMIDE 10 MG/ML IJ SOLN
INTRAMUSCULAR | Status: DC | PRN
  Administered 2015-09-07: 40 mg via INTRAVENOUS

## 2015-09-07 MED ORDER — SODIUM CHLORIDE 0.9 % IV SOLN
250.0000 mg | INTRAVENOUS | Status: DC | PRN
  Administered 2015-09-07 (×2): 1.75 mg/kg/h via INTRAVENOUS

## 2015-09-07 MED ORDER — FENTANYL CITRATE (PF) 100 MCG/2ML IJ SOLN
INTRAMUSCULAR | Status: AC
  Filled 2015-09-07: qty 2

## 2015-09-07 MED ORDER — SODIUM CHLORIDE 0.9% FLUSH: 3.0000 mL | Freq: Two times a day (BID) | INTRAVENOUS | Status: DC

## 2015-09-07 MED ORDER — SODIUM CHLORIDE 0.9% FLUSH
3.0000 mL | Freq: Two times a day (BID) | INTRAVENOUS | Status: DC
  Administered 2015-09-08: 10:00:00 3 mL via INTRAVENOUS

## 2015-09-07 MED ORDER — HYDRALAZINE HCL 20 MG/ML IJ SOLN
INTRAMUSCULAR | Status: DC | PRN
  Administered 2015-09-07: 10 mg via INTRAVENOUS

## 2015-09-07 MED ORDER — VERAPAMIL HCL 2.5 MG/ML IV SOLN
INTRAVENOUS | Status: DC | PRN
  Administered 2015-09-07: 10 mL via INTRA_ARTERIAL

## 2015-09-07 MED ORDER — MIDAZOLAM HCL 2 MG/2ML IJ SOLN
INTRAMUSCULAR | Status: DC | PRN
Start: 2015-09-07 — End: 2015-09-07
  Administered 2015-09-07: 1 mg via INTRAVENOUS

## 2015-09-07 MED ORDER — IOPAMIDOL (ISOVUE-370) INJECTION 76%
INTRAVENOUS | Status: AC
  Filled 2015-09-07: qty 125

## 2015-09-07 MED ORDER — VERAPAMIL HCL 2.5 MG/ML IV SOLN
INTRAVENOUS | Status: AC
  Filled 2015-09-07: qty 2

## 2015-09-07 MED ORDER — ANGIOPLASTY BOOK
Freq: Once | Status: AC
  Administered 2015-09-07: 19:00:00
  Filled 2015-09-07: qty 1

## 2015-09-07 MED ORDER — LIDOCAINE HCL (PF) 1 % IJ SOLN
INTRAMUSCULAR | Status: AC
  Filled 2015-09-07: qty 30

## 2015-09-07 MED ORDER — SODIUM CHLORIDE 0.9 % WEIGHT BASED INFUSION
3.0000 mL/kg/h | INTRAVENOUS | Status: DC
  Administered 2015-09-07: 3 mL/kg/h via INTRAVENOUS

## 2015-09-07 MED ORDER — IOPAMIDOL (ISOVUE-370) INJECTION 76%
INTRAVENOUS | Status: DC | PRN
  Administered 2015-09-07: 220 mL via INTRA_ARTERIAL

## 2015-09-07 MED ORDER — SODIUM CHLORIDE 0.9 % WEIGHT BASED INFUSION
1.0000 mL/kg/h | INTRAVENOUS | Status: DC
  Administered 2015-09-07: 1 mL/kg/h via INTRAVENOUS

## 2015-09-07 SURGICAL SUPPLY — 34 items
BALLN EMERGE MR 2.0X12 (BALLOONS) ×3
BALLN EUPHORA RX 2.5X12 (BALLOONS) ×3
BALLN ~~LOC~~ EMERGE MR 2.5X8 (BALLOONS) ×3
BALLN ~~LOC~~ EMERGE MR 3.25X8 (BALLOONS) ×3
BALLN ~~LOC~~ EMERGE MR 4.0X8 (BALLOONS) ×3
BALLN ~~LOC~~ EUPHORA RX 3.0X8 (BALLOONS) ×3
BALLN ~~LOC~~ TREK RX 2.25X8 (BALLOONS) ×3
BALLOON EMERGE MR 2.0X12 (BALLOONS) ×1 IMPLANT
BALLOON EUPHORA RX 2.5X12 (BALLOONS) ×1 IMPLANT
BALLOON ~~LOC~~ EMERGE MR 2.5X8 (BALLOONS) ×1 IMPLANT
BALLOON ~~LOC~~ EMERGE MR 3.25X8 (BALLOONS) ×1 IMPLANT
BALLOON ~~LOC~~ EMERGE MR 4.0X8 (BALLOONS) ×1 IMPLANT
BALLOON ~~LOC~~ EUPHORA RX 3.0X8 (BALLOONS) ×1 IMPLANT
BALLOON ~~LOC~~ TREK RX 2.25X8 (BALLOONS) ×1 IMPLANT
CABLE SURGICAL S-101-97-12 (CABLE) IMPLANT
CATH VISTA GUIDE 6FR JR4 SH (CATHETERS) ×3 IMPLANT
CATH VISTA GUIDE 6FR XBLAD3.5 (CATHETERS) ×3 IMPLANT
DEVICE RAD COMP TR BAND LRG (VASCULAR PRODUCTS) ×3 IMPLANT
DEVICE TORQUE .014-.018 (MISCELLANEOUS) ×1 IMPLANT
ELECT DEFIB PAD ADLT CADENCE (PAD) ×3 IMPLANT
GLIDESHEATH SLEND SS 6F .021 (SHEATH) ×3 IMPLANT
KIT ENCORE 26 ADVANTAGE (KITS) ×6 IMPLANT
KIT HEART LEFT (KITS) ×3 IMPLANT
PACK CARDIAC CATHETERIZATION (CUSTOM PROCEDURE TRAY) ×3 IMPLANT
SHEATH CLASSIC 8F (SHEATH) IMPLANT
STENT PROMUS PREM MR 2.25X16 (Permanent Stent) ×3 IMPLANT
STENT PROMUS PREM MR 3.0X12 (Permanent Stent) ×6 IMPLANT
STENT PROMUS PREM MR 3.0X8 (Permanent Stent) ×3 IMPLANT
STENT SYNERGY DES 4X12 (Permanent Stent) ×3 IMPLANT
TORQUE DEVICE .014-.018 (MISCELLANEOUS) ×3
TRANSDUCER W/STOPCOCK (MISCELLANEOUS) ×3 IMPLANT
TUBING CIL FLEX 10 FLL-RA (TUBING) ×3 IMPLANT
WIRE COUGAR XT STRL 190CM (WIRE) ×3 IMPLANT
WIRE SAFE-T 1.5MM-J .035X260CM (WIRE) ×3 IMPLANT

## 2015-09-07 NOTE — Progress Notes (Addendum)
ANTICOAGULATION CONSULT NOTE - Follow-up Consult  Pharmacy Consult for Heparin Indication: h/o afib, CVA  Allergies  Allergen Reactions  . Cilostazol Other (See Comments)     Pletal :" head felt funny"    Patient Measurements: Height: 5\' 7"  (170.2 cm) Weight: 215 lb (97.523 kg) IBW/kg (Calculated) : 61.6 Heparin Dosing Weight:  83 kg  Vital Signs: Temp: 97.9 F (36.6 C) (06/28 2110) Temp Source: Oral (06/28 2110) BP: 159/81 mmHg (06/28 2110) Pulse Rate: 74 (06/28 2110)  Labs:  Recent Labs  09/06/15 2342 09/06/15 2345  APTT  --  89*  HEPARINUNFRC 0.41  --     Estimated Creatinine Clearance: 47 mL/min (by C-G formula based on Cr of 1.24).  Assessment: 75 y/o F with known CAD on Eliquis PTA for afib (CHADS2-VASc=6) - Eliquis last dose 6/25. S/p cath 6/28 which showed severe 3VCAD. Heparin resumed post cath. Heparin level and aPTT are correlating so Eliquis appears to not be affecting heparin level anymore. Heparin level therapeutic on 1150 units/hr. No bleeding noted.  Goal of Therapy:  Heparin level 0.3-0.7 units/ml Monitor platelets by anticoagulation protocol: Yes   Plan:  Continue IV heparin at 1150 units/hr Daily HL and CBC  Christoper Fabian, PharmD, BCPS Clinical pharmacist, pager [phone]  09/07/2015,1:03 AM  Addendum:  Heparin level now up to slightly supratherapeutic (0.71) on 1150 units/hr. No bleeding noted.  Plan:  Decrease heparin to 1050 units/hr Will f/u 8 hr level  Christoper Fabian, PharmD, BCPS Clinical pharmacist, pager [phone] 09/07/2015 3:46 AM

## 2015-09-07 NOTE — Progress Notes (Signed)
Patient ID: Yvonne Bailey, female   DOB: 06/27/1940, 75 y.o.   MRN: [id number]    Subjective:  Denies SSCP, palpitations or Dyspnea Turned down for surgery by PVT  Objective:  Filed Vitals:   09/06/15 1124 09/06/15 2006 09/06/15 2110 09/07/15 0546  BP: 129/79  159/81 185/80  Pulse: 96  74 88  Temp: 98.3 F (36.8 C)  97.9 F (36.6 C) 97.9 F (36.6 C)  TempSrc:   Oral Oral  Resp: 18  18 20  Height:      Weight:      SpO2: 100% 98% 99% 99%    Intake/Output from previous day:  Intake/Output Summary (Last 24 hours) at 09/07/15 0712 Last data filed at 09/06/15 1645  Gross per 24 hour  Intake    480 ml  Output      0 ml  Net    480 ml    Physical Exam: Affect appropriate Healthy:  appears stated age HEENT: normal Neck supple with no adenopathy JVP normal no bruits no thyromegaly Lungs clear with no wheezing and good diaphragmatic motion Heart:  S1/S2 MR  murmur, no rub, gallop or click PMI normal Abdomen: benighn, BS positve, no tenderness, no AAA no bruit.  No HSM or HJR Distal pulses intact with no bruits No edema Neuro non-focal Skin warm and dry No muscular weakness Right radial cath sight A    Lab Results: Basic Metabolic Panel:  Recent Labs  09/07/15 0228  NA 143  K 4.4  CL 111  CO2 25  GLUCOSE 102*  BUN 10  CREATININE 1.01*  CALCIUM 8.7*   CBC:  Recent Labs  09/07/15 0228  WBC 8.9  HGB 12.6  HCT 39.0  MCV 96.1  PLT 218    Imaging: No results found.  Cardiac Studies:  ECG: afib PVC;s no acute ST changes    Telemetry:  NSR no arrhythmia 09/07/2015   Echo: 08/10/15 EF 20-25% moderate MR 08/10/15  Medications:   . atorvastatin  40 mg Oral QPM  . benazepril  20 mg Oral Daily  . brimonidine  1 drop Left Eye BID  . clopidogrel  600 mg Oral Once  . fesoterodine  8 mg Oral Daily  . furosemide  40 mg Oral Daily  . latanoprost  1 drop Both Eyes QPM  . levothyroxine  88 mcg Oral QAC breakfast  . metoprolol succinate  50 mg Oral BID   . multivitamin with minerals  1 tablet Oral Daily  . potassium chloride SA  20 mEq Oral Daily  . sodium chloride flush  3 mL Intravenous Q12H     . heparin 1,050 Units/hr (09/07/15 0523)    Assessment/Plan: 75 y.o. severe LV dysfunction EF 20-25% moderate MR 3VD  Turned down for surgery  1. Severe triple vessel CAD.  2. Severe stenosis ostial and proximal RCA 3. Severe stenosis proximal Circumflex and in the proximal segment of the large first obtuse marginal branch. The mid Circumflex is occluded and fills from right to left collaterals.  4. Moderate severe mid LAD stenosis with severe stenosis in the large diagonal branch.  5. Severe LV systolic dysfunction/ischemic cardiomyopathy.  6. Pulmonary HTN 7. Unstable angina  CAD:  Discussed with CM.  Turned down for CABG will arrange PCI/Stent today with CM Wrote order for plavix load CHF:  No dyspnea euvolemic no LV gram continue daily lasix Afib:  Rate control ok eliquis on hold ? BMS on heparin   Trenyce Loera 09/07/2015, 7:12 AM     

## 2015-09-07 NOTE — H&P (View-Only) (Signed)
Patient ID: DAZAH LOSIER, female   DOB: September 19, 1940, 75 y.o.   MRN: [id number]    Subjective:  Denies SSCP, palpitations or Dyspnea Turned down for surgery by PVT  Objective:  Filed Vitals:   09/06/15 1124 09/06/15 2006 09/06/15 2110 09/07/15 0546  BP: 129/79  159/81 185/80  Pulse: 96  74 88  Temp: 98.3 F (36.8 C)  97.9 F (36.6 C) 97.9 F (36.6 C)  TempSrc:   Oral Oral  Resp: 18  18 20   Height:      Weight:      SpO2: 100% 98% 99% 99%    Intake/Output from previous day:  Intake/Output Summary (Last 24 hours) at 09/07/15 2202 Last data filed at 09/06/15 1645  Gross per 24 hour  Intake    480 ml  Output      0 ml  Net    480 ml    Physical Exam: Affect appropriate Healthy:  appears stated age HEENT: normal Neck supple with no adenopathy JVP normal no bruits no thyromegaly Lungs clear with no wheezing and good diaphragmatic motion Heart:  S1/S2 MR  murmur, no rub, gallop or click PMI normal Abdomen: benighn, BS positve, no tenderness, no AAA no bruit.  No HSM or HJR Distal pulses intact with no bruits No edema Neuro non-focal Skin warm and dry No muscular weakness Right radial cath sight A    Lab Results: Basic Metabolic Panel:  Recent Labs  54/27/06 0228  NA 143  K 4.4  CL 111  CO2 25  GLUCOSE 102*  BUN 10  CREATININE 1.01*  CALCIUM 8.7*   CBC:  Recent Labs  09/07/15 0228  WBC 8.9  HGB 12.6  HCT 39.0  MCV 96.1  PLT 218    Imaging: No results found.  Cardiac Studies:  ECG: afib PVC;s no acute ST changes    Telemetry:  NSR no arrhythmia 09/07/2015   Echo: 08/10/15 EF 20-25% moderate MR 08/10/15  Medications:   . atorvastatin  40 mg Oral QPM  . benazepril  20 mg Oral Daily  . brimonidine  1 drop Left Eye BID  . clopidogrel  600 mg Oral Once  . fesoterodine  8 mg Oral Daily  . furosemide  40 mg Oral Daily  . latanoprost  1 drop Both Eyes QPM  . levothyroxine  88 mcg Oral QAC breakfast  . metoprolol succinate  50 mg Oral BID   . multivitamin with minerals  1 tablet Oral Daily  . potassium chloride SA  20 mEq Oral Daily  . sodium chloride flush  3 mL Intravenous Q12H     . heparin 1,050 Units/hr (09/07/15 0523)    Assessment/Plan: 75 y.o. severe LV dysfunction EF 20-25% moderate MR 3VD  Turned down for surgery  1. Severe triple vessel CAD.  2. Severe stenosis ostial and proximal RCA 3. Severe stenosis proximal Circumflex and in the proximal segment of the large first obtuse marginal branch. The mid Circumflex is occluded and fills from right to left collaterals.  4. Moderate severe mid LAD stenosis with severe stenosis in the large diagonal branch.  5. Severe LV systolic dysfunction/ischemic cardiomyopathy.  6. Pulmonary HTN 7. Unstable angina  CAD:  Discussed with CM.  Turned down for CABG will arrange PCI/Stent today with CM Wrote order for plavix load CHF:  No dyspnea euvolemic no LV gram continue daily lasix Afib:  Rate control ok eliquis on hold ? BMS on heparin   Charlton Haws 09/07/2015, 7:12 AM

## 2015-09-07 NOTE — Interval H&P Note (Signed)
History and Physical Interval Note:  09/07/2015 1:40 PM  Yvonne Bailey  has presented today for PCI of the LAD/Diagonal/Circumflex/OM/RCA with the diagnosis of CAD/unstable angina/ischemic cardiomyopathy. She was turned down for CABG. The various methods of treatment have been discussed with the patient and family. After consideration of risks, benefits and other options for treatment, the patient has consented to  Procedure(s): Coronary Stent Intervention (N/A) as a surgical intervention .  The patient's history has been reviewed, patient examined, no change in status, stable for surgery.  I have reviewed the patient's chart and labs.  Questions were answered to the patient's satisfaction.    Cath Lab Visit (complete for each Cath Lab visit)  Clinical Evaluation Leading to the Procedure:   ACS: No.  Non-ACS:    Anginal Classification: CCS III  Anti-ischemic medical therapy: Minimal Therapy (1 class of medications)  Non-Invasive Test Results: Intermediate-risk stress test findings: cardiac mortality 1-3%/year  Prior CABG: No previous CABG    [name]

## 2015-09-08 ENCOUNTER — Telehealth: Payer: Self-pay | Admitting: Cardiovascular Disease

## 2015-09-08 ENCOUNTER — Encounter (HOSPITAL_COMMUNITY): Payer: Self-pay | Admitting: Cardiovascular Disease

## 2015-09-08 DIAGNOSIS — I200 Unstable angina: Secondary | ICD-10-CM | POA: Diagnosis not present

## 2015-09-08 DIAGNOSIS — I4891 Unspecified atrial fibrillation: Secondary | ICD-10-CM | POA: Diagnosis not present

## 2015-09-08 DIAGNOSIS — I255 Ischemic cardiomyopathy: Secondary | ICD-10-CM | POA: Diagnosis not present

## 2015-09-08 LAB — BASIC METABOLIC PANEL
Anion gap: 9 (ref 5–15)
BUN: 9 mg/dL (ref 6–20)
CO2: 24 mmol/L (ref 22–32)
Calcium: 8.7 mg/dL — ABNORMAL LOW (ref 8.9–10.3)
Chloride: 106 mmol/L (ref 101–111)
Creatinine, Ser: 1.01 mg/dL — ABNORMAL HIGH (ref 0.44–1.00)
GFR calc Af Amer: >60 mL/min (ref >60)
GFR calc non Af Amer: 53 mL/min — ABNORMAL LOW (ref >60)
Glucose, Bld: 99 mg/dL (ref 65–99)
Potassium: 3.3 mmol/L — ABNORMAL LOW (ref 3.5–5.1)
Sodium: 139 mmol/L (ref 135–145)

## 2015-09-08 LAB — CBC
HCT: 39.2 % (ref 36.0–46.0)
Hemoglobin: 12.7 g/dL (ref 12.0–15.0)
MCH: 31 pg (ref 26.0–34.0)
MCHC: 32.4 g/dL (ref 30.0–36.0)
MCV: 95.6 fL (ref 78.0–100.0)
Platelets: 234 10*3/uL (ref 150–400)
RBC: 4.1 MIL/uL (ref 3.87–5.11)
RDW: 13.6 % (ref 11.5–15.5)
WBC: 10.6 10*3/uL — ABNORMAL HIGH (ref 4.0–10.5)

## 2015-09-08 MED ORDER — DIPHENHYDRAMINE-ZINC ACETATE 2-0.1 % EX CREA
TOPICAL_CREAM | Freq: Every day | CUTANEOUS | Status: DC | PRN
  Administered 2015-09-08: 11:00:00 via TOPICAL
  Filled 2015-09-08: qty 28

## 2015-09-08 MED ORDER — SPIRONOLACTONE 25 MG PO TABS: 25.0000 mg | ORAL_TABLET | Freq: Every day | ORAL | Status: DC

## 2015-09-08 MED ORDER — FUROSEMIDE 40 MG PO TABS
40.0000 mg | ORAL_TABLET | Freq: Two times a day (BID) | ORAL | Status: DC
  Administered 2015-09-08: 40 mg via ORAL
  Filled 2015-09-08: qty 1

## 2015-09-08 MED ORDER — ASPIRIN 81 MG PO TBEC: 81.0000 mg | DELAYED_RELEASE_TABLET | Freq: Every day | ORAL | Status: DC

## 2015-09-08 MED ORDER — CLOPIDOGREL BISULFATE 75 MG PO TABS
75.0000 mg | ORAL_TABLET | Freq: Every day | ORAL | Status: DC
  Administered 2015-09-08: 75 mg via ORAL
  Filled 2015-09-08: qty 1

## 2015-09-08 MED ORDER — CLOPIDOGREL BISULFATE 75 MG PO TABS: 75.0000 mg | ORAL_TABLET | Freq: Every day | ORAL | Status: DC

## 2015-09-08 MED ORDER — ASPIRIN EC 81 MG PO TBEC
81.0000 mg | DELAYED_RELEASE_TABLET | Freq: Every day | ORAL | Status: DC
  Administered 2015-09-08: 81 mg via ORAL
  Filled 2015-09-08: qty 1

## 2015-09-08 MED ORDER — SPIRONOLACTONE 25 MG PO TABS
25.0000 mg | ORAL_TABLET | Freq: Every day | ORAL | Status: DC
  Administered 2015-09-08: 25 mg via ORAL
  Filled 2015-09-08: qty 1

## 2015-09-08 MED ORDER — NITROGLYCERIN 0.4 MG SL SUBL: 0.4000 mg | SUBLINGUAL_TABLET | SUBLINGUAL | Status: AC | PRN

## 2015-09-08 MED ORDER — FUROSEMIDE 40 MG PO TABS: 40.0000 mg | ORAL_TABLET | Freq: Two times a day (BID) | ORAL | Status: DC

## 2015-09-08 MED ORDER — APIXABAN 5 MG PO TABS
5.0000 mg | ORAL_TABLET | Freq: Two times a day (BID) | ORAL | Status: DC
  Administered 2015-09-08: 11:00:00 5 mg via ORAL
  Filled 2015-09-08: qty 1

## 2015-09-08 MED FILL — Nitroglycerin IV Soln 100 MCG/ML in D5W: INTRA_ARTERIAL | Qty: 10 | Status: AC

## 2015-09-08 NOTE — Discharge Summary (Addendum)
Discharge Summary    Patient ID: Yvonne Bailey,  MRN: [id number], DOB/AGE: February 17, 1941 75 y.o.  Admit date: 09/06/2015 Discharge date: 09/08/2015  Primary Care Provider: Pincus Sanes Primary Cardiologist: Dr. Clifton James  Discharge Diagnoses    Principal Problem:   Unstable angina [facility]) Active Problems:   Hypothyroidism   Hyperlipidemia   Essential hypertension   Atrial fibrillation (HCC)   Chronic systolic heart failure (HCC)   Coronary artery disease involving native coronary artery of native heart with unstable angina pectoris (HCC)   Cardiomyopathy, ischemic   Allergies Allergies  Allergen Reactions  . Cilostazol Other (See Comments)     Pletal :" head felt funny"    Diagnostic Studies/Procedures  Right/Left Heart Cath and Coronary Angiography 09/06/15  Ost RCA to Prox RCA lesion, 80% stenosed.  Prox RCA lesion, 80% stenosed.  Mid RCA lesion, 70% stenosed.  Ost RPDA lesion, 70% stenosed.  Prox Cx lesion, 70% stenosed.  1st Mrg lesion, 80% stenosed.  Prox Cx to Mid Cx lesion, 80% stenosed.  Ost 3rd Mrg lesion, 99% stenosed.  Dist Cx lesion, 100% stenosed.  Ost LAD to Prox LAD lesion, 40% stenosed.  1st Diag lesion, 90% stenosed.  Prox LAD to Mid LAD lesion, 70% stenosed.  Mid LAD to Dist LAD lesion, 50% stenosed.  There is severe left ventricular systolic dysfunction.  1. Severe triple vessel CAD.  2. Severe stenosis ostial and proximal RCA 3. Severe stenosis proximal Circumflex and in the proximal segment of the large first obtuse marginal branch. The mid Circumflex is occluded and fills from right to left collaterals.  4. Moderate severe mid LAD stenosis with severe stenosis in the large diagonal branch.  5. Severe LV systolic dysfunction/ischemic cardiomyopathy.  6. Pulmonary HTN 7. Unstable angina  Recommendations: I think her revascularization would best be approached with CABG if she is deemed to be a surgical candidate.  Percutaneous revascularization would require multiple stents in all of the vessels. I will ask CT surgery to see her today. We may need to consider cardiac MRI to assess viability in the anterior wall. She had an anterior infarct in 1992 and the nuclear stress test shows scar in this region. Will start heparin 6 hours post sheath pull.       Coronary Stent Intervention 09/07/15 1. Triple vessel CAD 2. Severe stenosis proximal Circumflex, s/p successful PTCA/DES x 1.  3. Severe stenosis second obtuse marginal, s/p successful PTCA/DES x 1.  4. Severe stenosis diagonal 1, s/p successful PTCA/DES x 1.  5. Severe stenosis ostial RCA, s/p successful PTCA/DES x 1.  6. Severe stenosis mid RCA, s/p successful PTCA/DES x 1.   Recommendations: Will continue ASA and Plavix along with Eliquis for one month. Stop ASA in one month. Will treat with IV Lasix tonight for volume overload post cath. Continue statin and beta blocker.     _____________   History of Present Illness   Yvonne Bailey is a 75 year old female with a past medical history of CAD, HTN, PAD, carotid artery disease, and HLD. She had anterior MI in 1992, treated with PCI. She also has a history of PAF, started on Eliquis in March of 2016. She has known chronic systolic CHF with last EF of 20-25%, in June of 2017. This was reduced from her last EF of 35-40%, and she reported some DOE. As a result, she was sent for [facility]. Result was abnormal with defect consistent with previous MI. She was planned for left  heart cath for further evaluation.  Hospital Course  She underwent coronary angiography on 09/06/15, report above. She had severe diffuse disease. See above report. She was referred for CVTS consult, and it was determined that she would most likely not survive surgery.   She was taken back to the cath lab the next day on 09/07/15 and received a total of 5 stents. See above report. She will continue ASA + Plavix + Eliquis for one  month. Then, she will just take Plavix + Eliquis. She was treated post cath with IV Lasix for volume overload. Spironolactone was added to her regimen. She will remain on benazepril and beta blocker. She will also be on po Lasix to manage her CHF.   She was seen today by Dr. Eden Emms and deemed suitable for discharge.  _____________  Discharge Vitals Blood pressure 157/89, pulse 74, temperature 97.2 F (36.2 C), temperature source Axillary, resp. rate 16, height 5\' 7"  (1.702 m), weight 208 lb 1.8 oz (94.4 kg), SpO2 100 %.  Filed Weights   09/06/15 0844 09/08/15 0432  Weight: 215 lb (97.523 kg) 208 lb 1.8 oz (94.4 kg)    Labs & Radiologic Studies     CBC  Recent Labs  09/07/15 0228 09/08/15 0520  WBC 8.9 10.6*  HGB 12.6 12.7  HCT 39.0 39.2  MCV 96.1 95.6  PLT 218 234   Basic Metabolic Panel  Recent Labs  09/07/15 0228 09/08/15 0520  NA 143 139  K 4.4 3.3*  CL 111 106  CO2 25 24  GLUCOSE 102* 99  BUN 10 9  CREATININE 1.01* 1.01*  CALCIUM 8.7* 8.7*    Dg Chest 2 View  09/07/2015  CLINICAL DATA:  CABG. EXAM: CHEST  2 VIEW COMPARISON:  07/19/2015 . FINDINGS: Mediastinum hilar structures normal. Cardiomegaly with mild pulmonary vascular prominence interstitial prominence with small bilateral pleural effusions again noted. Findings suggest congestive heart failure. Progressive infiltrate right lower lobe consistent increase pulmonary edema and/or pneumonia. No pneumothorax. IMPRESSION: 1. Cardiomegaly with bilateral interstitial prominence and bilateral small pleural effusions consistent with congestive heart failure. 2. Developing infiltrate versus asymmetric pulmonary edema node in the right lower lobe. Electronically Signed   By: Maisie Fus  Register   On: 09/07/2015 08:04    Disposition   Pt is being discharged home today in good condition.  Follow-up Plans & Appointments    Follow-up Information    Follow up with Azalee Course, PA On 09/15/2015.   Specialties:  Cardiology,  Radiology   Why:  at 9:00 am for hospital follow up    Contact information:   [address] [place] [address] [phone]      Discharge Instructions    Amb Referral to Cardiac Rehabilitation    Complete by:  As directed   Diagnosis:  Coronary Stents     Diet - low sodium heart healthy    Complete by:  As directed      Discharge instructions    Complete by:  As directed   NO HEAVY LIFTING OR SEXUAL ACTIVITY for 7 DAYS. NO DRIVING for 3-5 DAYS. NO SOAKING BATHS, HOT TUBS, POOLS, ETC., for 7 DAYS  Groin Site Care Refer to this sheet in the next few weeks. These instructions provide you with information on caring for yourself after your procedure. Your caregiver may also give you more specific instructions. Your treatment has been planned according to current medical practices, but problems sometimes occur. Call your caregiver if you have any problems  or questions after your procedure. HOME CARE INSTRUCTIONS You may shower 24 hours after the procedure. Remove the bandage (dressing) and gently wash the site with plain soap and water. Gently pat the site dry.  Do not apply powder or lotion to the site.  Do not sit in a bathtub, swimming pool, or whirlpool for 5 to 7 days.  No bending, squatting, or lifting anything over 10 pounds (4.5 kg) as directed by your caregiver.  Inspect the site at least twice daily.  Do not drive home if you are discharged the same day of the procedure. Have someone else drive you.  You may drive 24 hours after the procedure unless otherwise instructed by your caregiver.  What to expect: Any bruising will usually fade within 1 to 2 weeks.  Blood that collects in the tissue (hematoma) may be painful to the touch. It should usually decrease in size and tenderness within 1 to 2 weeks.  SEEK IMMEDIATE MEDICAL CARE IF: You have unusual pain at the groin site or down the affected leg.  You have redness, warmth, swelling, or pain at the groin site.    You have drainage (other than a small amount of blood on the dressing).  You have chills.  You have a fever or persistent symptoms for more than 72 hours.  You have a fever and your symptoms suddenly get worse.  Your leg becomes pale, cool, tingly, or numb.  You have heavy bleeding from the site. Hold pressure on the site. .     Increase activity slowly    Complete by:  As directed            Discharge Medications   Current Discharge Medication List    START taking these medications   Details  aspirin EC 81 MG EC tablet Take 1 tablet (81 mg total) by mouth daily.    clopidogrel (PLAVIX) 75 MG tablet Take 1 tablet (75 mg total) by mouth daily. Qty: 30 tablet, Refills: 12    nitroGLYCERIN (NITROSTAT) 0.4 MG SL tablet Place 1 tablet (0.4 mg total) under the tongue every 5 (five) minutes as needed for chest pain. Qty: 25 tablet, Refills: 1    spironolactone (ALDACTONE) 25 MG tablet Take 1 tablet (25 mg total) by mouth daily. Qty: 30 tablet, Refills: 12      CONTINUE these medications which have CHANGED   Details  furosemide (LASIX) 40 MG tablet Take 1 tablet (40 mg total) by mouth 2 (two) times daily. Qty: 60 tablet, Refills: 12      CONTINUE these medications which have NOT CHANGED   Details  albuterol (VENTOLIN HFA) 108 (90 Base) MCG/ACT inhaler Inhale 1-2 puffs into the lungs every 6 (six) hours as needed for wheezing or shortness of breath. Qty: 18 g, Refills: 1    apixaban (ELIQUIS) 5 MG TABS tablet Take 1 tablet (5 mg total) by mouth 2 (two) times daily. Qty: 180 tablet, Refills: 2    Ascorbic Acid (VITAMIN C) 100 MG tablet Take 100 mg by mouth daily.      atorvastatin (LIPITOR) 40 MG tablet Take 1 tablet (40 mg total) by mouth daily. Qty: 90 tablet, Refills: 0    benazepril (LOTENSIN) 20 MG tablet TAKE 1 TABLET (20 MG TOTAL) DAILY. Qty: 90 tablet, Refills: 2    brimonidine (ALPHAGAN) 0.2 % ophthalmic solution PLACE ONE DROPTO THE LEFT EYE TWICE   DAILY Refills: 0    latanoprost (XALATAN) 0.005 % ophthalmic solution Place 0.005 drops into  both eyes every evening.    levothyroxine (SYNTHROID, LEVOTHROID) 88 MCG tablet Take 1 tablet (88 mcg total) by mouth daily. Qty: 90 tablet, Refills: 3    metoprolol succinate (TOPROL-XL) 50 MG 24 hr tablet Take 1 tablet (50 mg total) by mouth 2 (two) times daily. Take with or immediately following a meal. Qty: 180 tablet, Refills: 1   Associated Diagnoses: Chronic atrial fibrillation (HCC); Essential hypertension    Potassium Chloride ER 20 MEQ TBCR Take 20 mEq by mouth daily. Qty: 90 tablet, Refills: 3   Associated Diagnoses: Chronic systolic CHF (congestive heart failure) (HCC)    TOVIAZ 8 MG TB24 tablet Take 8 mg by mouth daily.     cholecalciferol (VITAMIN D) 1000 UNITS tablet Take 1,000 Units by mouth daily.    fish oil-omega-3 fatty acids 1000 MG capsule Take 1,000 mg by mouth daily.     Multiple Vitamin (MULTIVITAMIN) tablet Take 1 tablet by mouth daily.      vitamin E 100 UNIT capsule Take 100 Units by mouth daily.           Aspirin prescribed at discharge? Yes High Intensity Statin Prescribed? Yes Beta Blocker Prescribed? yes For EF 45% or less, Was ACEI/ARB Prescribed?yes ADP Receptor Inhibitor Prescribed? Yes For EF <40%, Aldosterone Inhibitor Prescribed? Yes Was EF assessed during THIS hospitalization? No  Was Cardiac Rehab II ordered? Yes   Outstanding Labs/Studies    Duration of Discharge Encounter   Greater than 30 minutes including physician time.  Vira Blanco NP 09/08/2015, 10:33 AM

## 2015-09-08 NOTE — Progress Notes (Signed)
Patient Name: Yvonne Bailey Date of Encounter: 09/08/2015  Primary Cardiologist: Dr. Clifton James   Principal Problem:   Unstable angina [facility]) Active Problems:   Hypothyroidism   Hyperlipidemia   Essential hypertension   Atrial fibrillation (HCC)   Chronic systolic heart failure (HCC)   Coronary artery disease involving native coronary artery of native heart with unstable angina pectoris (HCC)   Cardiomyopathy, ischemic    SUBJECTIVE Denies any CP or SOB. Feeling well.  CURRENT MEDS . apixaban  5 mg Oral BID  . aspirin EC  81 mg Oral Daily  . atorvastatin  40 mg Oral QPM  . benazepril  20 mg Oral Daily  . brimonidine  1 drop Left Eye BID  . clopidogrel  75 mg Oral Daily  . fesoterodine  8 mg Oral Daily  . furosemide  40 mg Intravenous Q12H  . latanoprost  1 drop Both Eyes QPM  . levothyroxine  88 mcg Oral QAC breakfast  . metoprolol succinate  50 mg Oral BID  . multivitamin with minerals  1 tablet Oral Daily  . potassium chloride SA  20 mEq Oral Daily  . sodium chloride flush  3 mL Intravenous Q12H  . sodium chloride flush  3 mL Intravenous Q12H    OBJECTIVE  Filed Vitals:   09/07/15 1715 09/07/15 1929 09/07/15 2000 09/08/15 0432  BP: 130/59 137/84  157/89  Pulse:  94 73 74  Temp:  97 F (36.1 C)  97.2 F (36.2 C)  TempSrc:  Axillary  Axillary  Resp: Height:      Weight:    208 lb 1.8 oz (94.4 kg)  SpO2:  98% 97% 100%    Intake/Output Summary (Last 24 hours) at 09/08/15 0706 Last data filed at 09/08/15 0625  Gross per 24 hour  Intake      0 ml  Output   5500 ml  Net  -5500 ml   Filed Weights   09/06/15 0844 09/08/15 0432  Weight: 215 lb (97.523 kg) 208 lb 1.8 oz (94.4 kg)    PHYSICAL EXAM  General: Pleasant, NAD. Neuro: Alert and oriented X 3. Moves all extremities spontaneously. Psych: Normal affect. HEENT:  Normal  Neck: Supple without bruits or JVD. Lungs:  Resp regular and unlabored, CTA. Heart: irregular. no s3, s4, or  murmurs. R radial and L radial cath site stable.  Abdomen: Soft, non-tender, non-distended, BS + x 4.  Extremities: No clubbing, cyanosis or edema. DP/PT/Radials 2+ and equal bilaterally.  Accessory Clinical Findings  CBC  Recent Labs  09/07/15 0228 09/08/15 0520  WBC 8.9 10.6*  HGB 12.6 12.7  HCT 39.0 39.2  MCV 96.1 95.6  PLT 218 234   Basic Metabolic Panel  Recent Labs  09/07/15 0228 09/08/15 0520  NA 143 139  K 4.4 3.3*  CL 111 106  CO2 25 24  GLUCOSE 102* 99  BUN 10 9  CREATININE 1.01* 1.01*  CALCIUM 8.7* 8.7*    TELE afib rate controlled.     ECG  afib with HR 70s  Echocardiogram 08/10/2015  LV EF: 20% - 25%  ------------------------------------------------------------------- Indications: CAD (I25.10).  ------------------------------------------------------------------- History: PMH: Atrial fibrillation. Coronary artery disease. Congestive heart failure. Transient ischemic attack. PMH: Myocardial infarction. Risk factors: Peripheral Arterial Disease, Ischemic Cardiomyopathy. Family history of coronary artery disease. Former tobacco use. Hypertension. Dyslipidemia.  ------------------------------------------------------------------- Study Conclusions  - Left ventricle: The cavity size was moderately dilated. Systolic  function was severely reduced. The estimated ejection  fraction  was in the range of 20% to 25%. Images were inadequate for LV  wall motion assessment but the apex does appear akinetic. There  appears to be a false tendon in the mid LV cavity of no clinical  significance. Doppler parameters are consistent with high  ventricular filling pressure. - Aortic valve: Trileaflet; mildly thickened, mildly calcified  leaflets. - Mitral valve: Calcified annulus. There was moderate  regurgitation. Valve area by pressure half-time: 1.95 cm^2. Valve  area by continuity equation (using LVOT flow): 0.92 cm^2. - Left  atrium: The atrium was moderately dilated. - Right ventricle: The cavity size was mildly dilated. Wall  thickness was normal. - Pulmonic valve: There was trivial regurgitation. - Pulmonary arteries: PA peak pressure: 47 mm Hg (S). - Impressions: Overall LV function appears severely reduced but  endocardial segments are not adequately visualized to comment.  Recommend limited echo with definity contrast agent for more  accurate assessment.  Impressions:  - Overall LV function appears severely reduced but endocardial  segments are not adequately visualized to comment. Recommend  limited echo with definity contrast agent for more accurate  assessment. The right ventricular systolic pressure was increased  consistent with moderate pulmonary hypertension    Radiology/Studies  Dg Chest 2 View  09/07/2015  CLINICAL DATA:  CABG. EXAM: CHEST  2 VIEW COMPARISON:  07/19/2015 . FINDINGS: Mediastinum hilar structures normal. Cardiomegaly with mild pulmonary vascular prominence interstitial prominence with small bilateral pleural effusions again noted. Findings suggest congestive heart failure. Progressive infiltrate right lower lobe consistent increase pulmonary edema and/or pneumonia. No pneumothorax. IMPRESSION: 1. Cardiomegaly with bilateral interstitial prominence and bilateral small pleural effusions consistent with congestive heart failure. 2. Developing infiltrate versus asymmetric pulmonary edema node in the right lower lobe. Electronically Signed   By: Maisie Fus  Register   On: 09/07/2015 08:04    ASSESSMENT AND PLAN  75 yo female with history of CAD, HTN, PAD, carotid artery disease, hyperlipidemia presented for outpatient cath for evaluation of worsening dyspnea  1. Progressive dyspnea  - Recent echo shows EF down to 20-25% from previous 45% in 2007. Myoview 08/23/2015 shows anterior scar but no ischemia.   - Cath 09/06/2015 triple vessel CAD. Turned down for CABG due to  comorbidities, recommended PCI and medical management of MR  - cath 09/07/2015 PTCA/DES to prox LCx, PTCA/DES to OM2, PTCA/DES to D1, PTCA/DES to ost RCA, PTCA/DES to mid RCA. Patient received 5 separate stent. Plan to continue ASA, plavix and eliquis for 1 month, then stop ASA after that. Restart eliquis today. Unclear why no daily ASA and plavix ordered, will order them.  - likely discharge today. Continue benazepril and metoprolol. Add spironolactone.  2. Chronic systolic HF: on IV lasix, -5L. Weight down from 215 down to 208. She is euvolemic, will transition to PO lasix 40mg  BID.   3. CAD  - 1992 she had an anterior MI treated with PCI  4. ICM CM: EF down to 20-25%  5. PAD  - carotid U/S 03/2015 1-39% bilateral ICA stenosis, chronically occluded L vertebral artery  - ABI 03/2015 R ABI 0.65, L ABI 0.72, R TBI 0.46, L TBI 0.43  6. Chronic afib on eliquis  -CHA2DS2-Vasc score 6 (female, age, CHF, CAD, HTN)  7. HTN   Signed, Azalee Course PA-C Pager: [phone]  Only complains of back itching no angina no chf.  Cath sight fine MR murmur on exam. No edema Ambulate likely d/c latter today Will give a bit of benedryl for itching  no rash suspect some irritation From linens and cath lab table  [facility]

## 2015-09-08 NOTE — Telephone Encounter (Signed)
TCM Phone Call .Marland Kitchen Appt on 09/15/15 at 9am w/ Azalee Course at the [facility]

## 2015-09-08 NOTE — Progress Notes (Signed)
CARDIAC REHAB PHASE I   PRE:  Rate/Rhythm: 83 a fib  BP:  Sitting: 128/46        SaO2: 99 RA  MODE:  Ambulation: 200 ft   POST:  Rate/Rhythm: 100 a fib  BP:  Sitting: 127/52         SaO2: 97 RA  Pt ambulated 200 ft on RA, cane, gait belt, handheld assist, fairly steady gait, tolerated fairly well.  Pt c/o mild DOE, denies cp, dizziness, brief standing rest x1. Completed PCI/stent education.  Reviewed risk factors, stent book, anti-platelet therapy, stent card, activity restrictions, ntg, exercise, heart healthy diet, sodium restrictions, CHF booklet and zone tool, daily weights and phase 2 cardiac rehab. Pt verbalized understanding, receptive to education. Pt agrees to phase 2 cardiac rehab referral, will send to [facility] per pt request. Pt to recliner after walk, call bell within reach. Will follow.  [phone] Joylene Grapes, RN, BSN 09/08/2015 9:51 AM

## 2015-09-11 NOTE — Telephone Encounter (Signed)
Patient contacted regarding discharge from [facility] on September 08, 2015.  Patient understands to follow up with provider Azalee Course, PA-C on September 15, 2015 at [facility]. Patient understands discharge instructions? yes Patient understands medications and regiment? yes Patient understands to bring all medications to this visit? yes

## 2015-09-14 NOTE — Progress Notes (Signed)
Cardiology Office Note    Date:  09/15/2015   ID:  Yvonne Bailey, DOB 1940/09/28, MRN [id number]  PCP:  Pincus Sanes, MD  Cardiologist:  Dr. Clifton James  Chief Complaint  Patient presents with  . Transitions Of Care    7 day TCM, seen for Dr. Clifton James    History of Present Illness:  Yvonne Bailey is a 75 y.o. female with PMH of CAD, HTN, PAD, carotid artery disease, PAF on eliquis, HLD who was recently admitted on 6/28-6/30. She had a history of anterior MI in 1992 treated with PCI. She was previously followed by Dr. Juanda Chance. She also had carotid artery disease and had Doppler studies in January 2017 which showed 0-39% stenosis bilaterally. Lower extremity arterial Doppler in July 2012 showed occlusion of right SFA was severe stenosis of left CFA and SFA with ABI of 0.5 on the right and 0.63 on the left. ABI in January 2017 was 0.65 on the right and 0.72 on the left. She also had a history of swelling on 10 mg Norvasc, therefore it was lowered to 5 mg daily. She was diagnosed was PAF in March 2016 and was started on eliquis. She was seen in the office in May 2017 for increased dyspnea, echocardiogram was obtained on 08/10/2015 which showed EF 20-25%, moderate MR, PA peak pressure 47 mmHg. Ejection fraction appears to be down from the previous echocardiogram in October 2014 which showed EF 35-40%. Myoview was obtained on 08/23/2015 which showed medium defect of severe severity present in the mid anteroseptal, apical anterior, apical septal, apical inferior, apical lateral and apex location consistent with old infarction, no ischemia identified.  She was seen back in the clinic at Dr. Clifton James on 09/01/2015, outpatient cardiac catheterization was arranged to exclude progression of CAD. She underwent cardiac catheterization on 08/29/2015 which showed severe triple second vessel CAD, with severe stenosis in ostial proximal RCA, severe stenosis in proximal left circumflex and large OM 1, mid left  circumflex occluded with right-to-left collateral, moderate severe mid LAD stenosis with severe stenosis in large diagonal branch. She was seen by Dr. Donata Clay on 09/06/2015 as part of cardiothoracic surgery consult. It was felt that she would be a high risk candidate for bypass surgery and likely would not survive the procedure. Therefore PCI intervention of LAD left circumflex was recommended along with medical treatment of MR. She was brought back to the left cath lab on 09/07/2015 and underwent PTCA/DES of proximal left circumflex, PTCA/DES of OM 2, PTCA/DES of D1, PTCA/DES of ostial RCA, PTCA and DES of mid RCA. Postprocedure, she was placed on aspirin and Plavix along with eliquis with plan of stopping aspirin after one month. She was seen on 09/08/2015 at which time she was doing well. She was on benazepril and metoprolol. Spironolactone was added to her medical regimen. Eliquis was restarted. She was discharged on the same day.  She presents today for seven-day transition of care follow-up. She has been very well since discharge. She feels like she is a new person. She has not started on cardiac rehabilitation.. Given hypokalemia in the hospital, I will obtain a basic metabolic panel today. She is also on triple therapy at this time, we will obtain a CBC. She has been instructed to stop aspirin in 3 weeks after today and continue on Plavix and eliquis afterward. She is going to start on cardiac rehabilitation. Her blood pressure is borderline today at 98/60. However she does not have any dizziness and  has been feeling very well. I will continue her current medication and instructed her to check her blood pressure on daily basis twice a day, if her blood pressure remained low, we can potentially cut benazepril down to 10 mg daily. Given her ischemic cardiac myopathy, we will obtain a repeat echocardiogram in 3 month hopefully by then her pumping function with the heart has returned back to normal. She will  follow-up with Dr. Vesta Mixer he afterward.  Check BMET given addition of spironolactone.    Past Medical History  Diagnosis Date  . MI (myocardial infarction) (HCC) 1992    at age 41; TIA treated with coumadin until Plavix initiated in 2006  . Hyperlipidemia   . HTN (hypertension)   . Hypothyroidism   . DVT (deep venous thrombosis) (HCC)     X1  . CAD (coronary artery disease)     S/P  anterior  wall myocardial infarction in '92, treated w/percutaneous transluminal coronary angioplasty of the left anterior descending. EF 45%  . PAD (peripheral artery disease) (HCC)     Right SFA occlusion, severe disease left CFA and SFA  . Carotid artery disease (HCC)   . Ischemic cardiomyopathy     a. Echo 6/17: EF 20-25%, apex appears akinetic, MAC, moderate MR, moderate LAE, mild RVE, trivial PI, PASP 47 mmHg (needs repeat with Definity contrast)   . History of nuclear stress test     a. Myoview 6/17: EF 20-25%, mid anteroseptal, apical anterior, apical septal, apical inferior, apical lateral and apical scar, no ischemia, intermediate risk    Past Surgical History  Procedure Laterality Date  . Fracture lle      '94; pinned  . Appendectomy      at hysterectomy and USO for fibroids, Dr. Kizzie Bane  . Tonsillectomy    . Colonoscopy      negative; 2008, Dr. Lina Sar  . Cardiac catheterization  1992    Dr Charlies Constable  . Total abdominal hysterectomy      & BSO for fibroids  . Cardiac catheterization N/A 09/06/2015    Procedure: Right/Left Heart Cath and Coronary Angiography;  Surgeon: Kathleene Hazel, MD;  Location: [facility] INVASIVE CV LAB;  Service: Cardiovascular;  Laterality: N/A;  . Cardiac catheterization N/A 09/07/2015    Procedure: Coronary Stent Intervention;  Surgeon: Kathleene Hazel, MD;  Location: [facility] INVASIVE CV LAB;  Service: Cardiovascular;  Laterality: N/A;    Current Medications: Outpatient Prescriptions Prior to Visit  Medication Sig Dispense Refill  . albuterol  (VENTOLIN HFA) 108 (90 Base) MCG/ACT inhaler Inhale 1-2 puffs into the lungs every 6 (six) hours as needed for wheezing or shortness of breath. 18 g 1  . apixaban (ELIQUIS) 5 MG TABS tablet Take 1 tablet (5 mg total) by mouth 2 (two) times daily. 180 tablet 2  . Ascorbic Acid (VITAMIN C) 100 MG tablet Take 100 mg by mouth daily.      Marland Kitchen aspirin EC 81 MG EC tablet Take 1 tablet (81 mg total) by mouth daily.    Marland Kitchen atorvastatin (LIPITOR) 40 MG tablet Take 1 tablet (40 mg total) by mouth daily. 90 tablet 0  . brimonidine (ALPHAGAN) 0.2 % ophthalmic solution PLACE ONE DROPTO THE LEFT EYE TWICE  DAILY  0  . cholecalciferol (VITAMIN D) 1000 UNITS tablet Take 1,000 Units by mouth daily.    . clopidogrel (PLAVIX) 75 MG tablet Take 1 tablet (75 mg total) by mouth daily. 30 tablet 12  . fish oil-omega-3 fatty acids 1000  MG capsule Take 1,000 mg by mouth daily.     . furosemide (LASIX) 40 MG tablet Take 1 tablet (40 mg total) by mouth 2 (two) times daily. 60 tablet 12  . latanoprost (XALATAN) 0.005 % ophthalmic solution Place 0.005 drops into both eyes every evening.    Marland Kitchen levothyroxine (SYNTHROID, LEVOTHROID) 88 MCG tablet Take 1 tablet (88 mcg total) by mouth daily. 90 tablet 3  . metoprolol succinate (TOPROL-XL) 50 MG 24 hr tablet Take 1 tablet (50 mg total) by mouth 2 (two) times daily. Take with or immediately following a meal. 180 tablet 1  . Multiple Vitamin (MULTIVITAMIN) tablet Take 1 tablet by mouth daily.      . nitroGLYCERIN (NITROSTAT) 0.4 MG SL tablet Place 1 tablet (0.4 mg total) under the tongue every 5 (five) minutes as needed for chest pain. 25 tablet 1  . Potassium Chloride ER 20 MEQ TBCR Take 20 mEq by mouth daily. 90 tablet 3  . spironolactone (ALDACTONE) 25 MG tablet Take 1 tablet (25 mg total) by mouth daily. 30 tablet 12  . TOVIAZ 8 MG TB24 tablet Take 8 mg by mouth daily.     . vitamin E 100 UNIT capsule Take 100 Units by mouth daily.      . benazepril (LOTENSIN) 20 MG tablet TAKE 1  TABLET (20 MG TOTAL) DAILY. (Patient not taking: Reported on 09/15/2015) 90 tablet 2   No facility-administered medications prior to visit.     Allergies:   Cilostazol   Social History   Social History  . Marital Status: Married    Spouse Name: Fayrene Fearing  . Number of Children: 1  . Years of Education: college   Occupational History  . Teacher     Retired   Social History Main Topics  . Smoking status: Former Smoker    Quit date: 03/11/1990  . Smokeless tobacco: Never Used     Comment: smoked 1973- 1992, up to < 5 cigarettes  . Alcohol Use: No  . Drug Use: No  . Sexual Activity: Not Asked   Other Topics Concern  . 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 08/31/09- designated party form signed appointing, husband Lenny Bouchillon; ok to leave msg on home phone [phone].      Caffeine Small Amount one cup very rare.   Right handed.   One child- [name]     Family History:  The patient's family history includes Coronary artery disease in her brother; Diabetes in her mother; Hypertension in her mother; Stroke in her brother. There is no history of Cancer.   ROS:   Please see the history of present illness.    ROS All other systems reviewed and are negative.   PHYSICAL EXAM:   VS:  BP 98/60 mmHg  Pulse 88  Ht 5\' 7"  (1.702 m)  Wt 206 lb 6.4 oz (93.622 kg)  BMI 32.32 kg/m2   GEN: Well nourished, well developed, in no acute distress HEENT: normal Neck: no JVD, carotid bruits, or masses Cardiac: irregularly irregular; no murmurs, rubs, or gallops,no edema  Respiratory:  clear to auscultation bilaterally, normal work of breathing GI: soft, nontender, nondistended, + BS MS: no deformity or atrophy Skin: warm and dry, no rash Neuro:  Alert and Oriented x 3, Strength and sensation are intact Psych: euthymic mood, full affect  Wt Readings from Last 3 Encounters:  09/15/15 206 lb 6.4 oz (93.622 kg)  09/08/15 208 lb 1.8 oz (94.4 kg)  09/01/15 215 lb (97.523 kg)      Studies/Labs Reviewed:   EKG:  EKG is ordered today.  The ekg ordered today demonstrates afib, rate controlled, HR 87  Recent Labs: 07/25/2015: Brain Natriuretic Peptide 177.4* 08/21/2015: ALT 23; TSH 2.54 09/15/2015: BUN 24; Creat 1.14*; Hemoglobin 14.4; Platelets 318; Potassium 4.6; Sodium 141   Lipid Panel    Component Value Date/Time   CHOL 138 05/09/2014 0930   TRIG 71.0 05/09/2014 0930   HDL 44.30 05/09/2014 0930   CHOLHDL 3 05/09/2014 0930   VLDL 14.2 05/09/2014 0930   LDLCALC 80 05/09/2014 0930    Additional studies/ records that were reviewed today include:    Echo 08/10/2015 LV EF: 20% - 25%, moderate MR  Myoiew 08/23/2015 infarct, but no ischemia  Cath 09/06/2015 severe 3v CAD. Turned by for CABG  Cath 09/07/2015 PTCA/DES of proximal left circumflex, PTCA/DES of OM 2, PTCA/DES of D1, PTCA/DES of ostial RCA, PTCA and DES of mid RCA    ASSESSMENT:    1. Coronary artery disease involving native coronary artery of native heart without angina pectoris   2. Cardiomyopathy, ischemic   3. PAF (paroxysmal atrial fibrillation) (HCC)   4. Hyperlipidemia LDL goal <70   5. Essential hypertension   6. PAD (peripheral artery disease) (HCC)      PLAN:  In order of problems listed above:  1. CAD  - 3v disease noted on cath 6/28, turned down for CABG, underwent 5 stents placement on 09/07/2015 (PTCA/DES of proximal left circumflex, PTCA/DES of OM 2, PTCA/DES of D1, PTCA/DES of ostial RCA, PTCA/DES of mid RCA).   - Continue on triple therapy for 1 month, then drop ASA. Check BMET and CBC today.   2. HTN: Her blood pressure is 98/60 today, I will continue on the current medication of benazepril, Lasix, and the Toprol-XL. I have encouraged her to monitor her blood pressure at home, if continued to be low, we can cut the benazepril to 10 mg daily.  3. PAD: LE ABI 04/07/2015 R ABI 0.65, L ABI 0.72  4. carotid  artery disease: carotid U/S 1-39% bilateral edema  5. Chronic AF on eliquis: rate controlled afib on EKG today  6. HLD: Continue Lipitor    Medication Adjustments/Labs and Tests Ordered: Current medicines are reviewed at length with the patient today.  Concerns regarding medicines are outlined above.  Medication changes, Labs and Tests ordered today are listed in the Patient Instructions below. Patient Instructions  Medication Instructions:  Your physician recommends that you continue on your current medications as directed. Please refer to the Current Medication list given to you today. 1. STOP ASPRIN (81 MG ) 10/06/2015  Labwork: Your physician recommends that you have lab work today: bmet/cbc   Testing/Procedures: Your physician has requested that you have an echocardiogram. Echocardiography is a painless test that uses sound waves to create images of your heart. It provides your doctor with information about the size and shape of your heart and how well your heart's chambers and valves are working. This procedure takes approximately one hour. There are no restrictions for this procedure.    Follow-Up: Your physician wants you to follow-up in: 3 months with Dr. Clifton James after echo.  You will receive a reminder letter in the mail two months in advance. If you don't receive a letter, please call our office to schedule the follow-up appointment.   Any Other Special Instructions Will Be Listed Below (If  Applicable).     If you need a refill on your cardiac medications before your next appointment, please call your pharmacy.       Ramond Dial, [place]  09/15/2015 11:52 PM    [facility] [address] [place], [place], [place]  [address] Phone: [phone]; Fax: [phone]

## 2015-09-15 ENCOUNTER — Encounter: Payer: Self-pay | Admitting: Physician Assistant

## 2015-09-15 ENCOUNTER — Ambulatory Visit (INDEPENDENT_AMBULATORY_CARE_PROVIDER_SITE_OTHER): Payer: Commercial Managed Care - HMO | Admitting: Physician Assistant

## 2015-09-15 VITALS — BP 98/60 | HR 88 | Ht 67.0 in | Wt 206.4 lb

## 2015-09-15 DIAGNOSIS — I2510 Atherosclerotic heart disease of native coronary artery without angina pectoris: Secondary | ICD-10-CM | POA: Diagnosis not present

## 2015-09-15 DIAGNOSIS — E785 Hyperlipidemia, unspecified: Secondary | ICD-10-CM | POA: Diagnosis not present

## 2015-09-15 DIAGNOSIS — I255 Ischemic cardiomyopathy: Secondary | ICD-10-CM | POA: Diagnosis not present

## 2015-09-15 DIAGNOSIS — I10 Essential (primary) hypertension: Secondary | ICD-10-CM

## 2015-09-15 DIAGNOSIS — I739 Peripheral vascular disease, unspecified: Secondary | ICD-10-CM

## 2015-09-15 DIAGNOSIS — I480 Paroxysmal atrial fibrillation: Secondary | ICD-10-CM | POA: Diagnosis not present

## 2015-09-15 LAB — CBC WITH DIFFERENTIAL/PLATELET
Basophils Absolute: 114 cells/uL (ref 0–200)
Basophils Relative: 1 %
Eosinophils Absolute: 228 cells/uL (ref 15–500)
Eosinophils Relative: 2 %
HCT: 43.6 % (ref 35.0–45.0)
Hemoglobin: 14.4 g/dL (ref 11.7–15.5)
Lymphocytes Relative: 27 %
Lymphs Abs: 3078 cells/uL (ref 850–3900)
MCH: 31.4 pg (ref 27.0–33.0)
MCHC: 33 g/dL (ref 32.0–36.0)
MCV: 95.2 fL (ref 80.0–100.0)
MPV: 10.5 fL (ref 7.5–12.5)
Monocytes Absolute: 912 cells/uL (ref 200–950)
Monocytes Relative: 8 %
Neutro Abs: 7068 cells/uL (ref 1500–7800)
Neutrophils Relative %: 62 %
Platelets: 318 10*3/uL (ref 140–400)
RBC: 4.58 MIL/uL (ref 3.80–5.10)
RDW: 13.6 % (ref 11.0–15.0)
WBC: 11.4 10*3/uL — ABNORMAL HIGH (ref 3.8–10.8)

## 2015-09-15 LAB — BASIC METABOLIC PANEL
BUN: 24 mg/dL (ref 7–25)
CO2: 26 mmol/L (ref 20–31)
Calcium: 9.1 mg/dL (ref 8.6–10.4)
Chloride: 104 mmol/L (ref 98–110)
Creat: 1.14 mg/dL — ABNORMAL HIGH (ref 0.60–0.93)
Glucose, Bld: 106 mg/dL — ABNORMAL HIGH (ref 65–99)
Potassium: 4.6 mmol/L (ref 3.5–5.3)
Sodium: 141 mmol/L (ref 135–146)

## 2015-09-15 NOTE — Patient Instructions (Signed)
Medication Instructions:  Your physician recommends that you continue on your current medications as directed. Please refer to the Current Medication list given to you today. 1. STOP ASPRIN (81 MG ) 10/06/2015  Labwork: Your physician recommends that you have lab work today: bmet/cbc   Testing/Procedures: Your physician has requested that you have an echocardiogram. Echocardiography is a painless test that uses sound waves to create images of your heart. It provides your doctor with information about the size and shape of your heart and how well your heart's chambers and valves are working. This procedure takes approximately one hour. There are no restrictions for this procedure.    Follow-Up: Your physician wants you to follow-up in: 3 months with Dr. Clifton James after echo.  You will receive a reminder letter in the mail two months in advance. If you don't receive a letter, please call our office to schedule the follow-up appointment.   Any Other Special Instructions Will Be Listed Below (If Applicable).     If you need a refill on your cardiac medications before your next appointment, please call your pharmacy.

## 2015-09-18 ENCOUNTER — Telehealth: Payer: Self-pay | Admitting: Internal Medicine

## 2015-09-18 NOTE — Telephone Encounter (Signed)
done

## 2015-09-18 NOTE — Telephone Encounter (Signed)
Patient called in and advised that she needs a referral to urologist for incontinence.  [facility]  Dr. Myrtie Soman  09/19/2015 is the appointment at 2:45  Please review for tomorrow.

## 2015-09-19 DIAGNOSIS — N3941 Urge incontinence: Secondary | ICD-10-CM | POA: Diagnosis not present

## 2015-09-19 DIAGNOSIS — R350 Frequency of micturition: Secondary | ICD-10-CM | POA: Diagnosis not present

## 2015-09-26 ENCOUNTER — Telehealth: Payer: Self-pay | Admitting: Cardiovascular Disease

## 2015-09-26 MED ORDER — ATORVASTATIN CALCIUM 40 MG PO TABS: 40.0000 mg | ORAL_TABLET | Freq: Every day | ORAL | Status: DC

## 2015-09-26 NOTE — Telephone Encounter (Signed)
New Message   *STAT* If patient is at the pharmacy, call can be transferred to refill team.   1. Which medications need to be refilled? (please list name of each medication and dose if known) atorvastatin 40  2. Which pharmacy/location (including street and city if local pharmacy) is medication to be sent to?needs change to [address]  3. Do they need a 30 day or 90 day supply? 90

## 2015-09-26 NOTE — Telephone Encounter (Signed)
Pt's Rx was sent to pt's pharmacy as requested. Confirmation received.  °

## 2015-09-29 ENCOUNTER — Other Ambulatory Visit: Payer: Self-pay | Admitting: Internal Medicine

## 2015-10-03 ENCOUNTER — Encounter (HOSPITAL_COMMUNITY)
Admission: RE | Admit: 2015-10-03 | Discharge: 2015-10-03 | Disposition: A | Discharge status: Home / Self Care | Payer: Commercial Managed Care - HMO | Source: Ambulatory Visit | Attending: Cardiovascular Disease | Admitting: Cardiovascular Disease

## 2015-10-03 VITALS — BP 120/70 | HR 80 | Ht 67.0 in | Wt 209.0 lb

## 2015-10-03 DIAGNOSIS — Z955 Presence of coronary angioplasty implant and graft: Secondary | ICD-10-CM

## 2015-10-03 NOTE — Progress Notes (Signed)
Cardiac Rehab Medication Review by a Pharmacist  Does the patient  feel that his/her medications are working for him/her?  yes  Has the patient been experiencing any side effects to the medications prescribed?  no  Does the patient measure his/her own blood pressure or blood glucose at home?  no   Does the patient have any problems obtaining medications due to transportation or finances?   no  Understanding of regimen: good Understanding of indications: good Potential of compliance: good   Pharmacist comments: EL is a 56 yoF who presents to cardiac rehab in good spirits, using a cane for walking support. She has a good understanding of her medication regimen, and voiced no complaints. We discussed the importance of measuring her BP at home, and importance of keeping NTG on her at all times. She voiced understanding of plan.   Allie Bossier, PharmD PGY1 Pharmacy Resident [phone] (Pager) 10/03/2015 3:17 PM

## 2015-10-05 ENCOUNTER — Encounter (HOSPITAL_COMMUNITY): Payer: Self-pay

## 2015-10-05 NOTE — Progress Notes (Signed)
Cardiac Individual Treatment Plan  Patient Details  Name: Yvonne Bailey MRN: [id number] Date of Birth: 1940-11-14 Referring Provider:   Flowsheet Row CARDIAC REHAB PHASE II ORIENTATION from 10/03/2015 in [facility] CARDIAC REHAB  Referring Provider  Earney Hamburg , MD      Initial Encounter Date:  Flowsheet Row CARDIAC REHAB PHASE II ORIENTATION from 10/03/2015 in [facility] CARDIAC REHAB  Date  10/03/15  Referring Provider  Earney Hamburg , MD      Visit Diagnosis: Status post coronary artery stent placement  Patient's Home Medications on Admission:  Current Outpatient Prescriptions:  .  apixaban (ELIQUIS) 5 MG TABS tablet, Take 1 tablet (5 mg total) by mouth 2 (two) times daily., Disp: 180 tablet, Rfl: 2 .  Ascorbic Acid (VITAMIN C) 100 MG tablet, Take 100 mg by mouth daily.  , Disp: , Rfl:  .  aspirin EC 81 MG EC tablet, Take 1 tablet (81 mg total) by mouth daily., Disp: , Rfl:  .  atorvastatin (LIPITOR) 40 MG tablet, Take 1 tablet (40 mg total) by mouth daily., Disp: 90 tablet, Rfl: 3 .  benazepril (LOTENSIN) 20 MG tablet, TAKE 1 TABLET EVERY DAY, Disp: 90 tablet, Rfl: 2 .  brimonidine (ALPHAGAN) 0.2 % ophthalmic solution, PLACE ONE DROPTO THE LEFT EYE TWICE  DAILY, Disp: , Rfl: 0 .  cholecalciferol (VITAMIN D) 1000 UNITS tablet, Take 1,000 Units by mouth daily., Disp: , Rfl:  .  clopidogrel (PLAVIX) 75 MG tablet, Take 1 tablet (75 mg total) by mouth daily., Disp: 30 tablet, Rfl: 12 .  fish oil-omega-3 fatty acids 1000 MG capsule, Take 1,000 mg by mouth daily. , Disp: , Rfl:  .  furosemide (LASIX) 40 MG tablet, Take 1 tablet (40 mg total) by mouth 2 (two) times daily., Disp: 60 tablet, Rfl: 12 .  latanoprost (XALATAN) 0.005 % ophthalmic solution, Place 0.005 drops into both eyes every evening., Disp: , Rfl:  .  levothyroxine (SYNTHROID, LEVOTHROID) 88 MCG tablet, Take 1 tablet (88 mcg total) by mouth daily., Disp: 90 tablet, Rfl: 3 .   metoprolol succinate (TOPROL-XL) 50 MG 24 hr tablet, Take 1 tablet (50 mg total) by mouth 2 (two) times daily. Take with or immediately following a meal., Disp: 180 tablet, Rfl: 1 .  Multiple Vitamin (MULTIVITAMIN) tablet, Take 1 tablet by mouth daily.  , Disp: , Rfl:  .  nitroGLYCERIN (NITROSTAT) 0.4 MG SL tablet, Place 1 tablet (0.4 mg total) under the tongue every 5 (five) minutes as needed for chest pain., Disp: 25 tablet, Rfl: 1 .  Potassium Chloride ER 20 MEQ TBCR, Take 20 mEq by mouth daily., Disp: 90 tablet, Rfl: 3 .  spironolactone (ALDACTONE) 25 MG tablet, Take 1 tablet (25 mg total) by mouth daily., Disp: 30 tablet, Rfl: 12 .  vitamin E 100 UNIT capsule, Take 100 Units by mouth daily.  , Disp: , Rfl:  .  albuterol (VENTOLIN HFA) 108 (90 Base) MCG/ACT inhaler, Inhale 1-2 puffs into the lungs every 6 (six) hours as needed for wheezing or shortness of breath. (Patient not taking: Reported on 10/03/2015), Disp: 18 g, Rfl: 1 .  TOVIAZ 8 MG TB24 tablet, Take 8 mg by mouth daily. , Disp: , Rfl:   Past Medical History: Past Medical History:  Diagnosis Date  . CAD (coronary artery disease)    S/P  anterior  wall myocardial infarction in '75, treated w/percutaneous transluminal coronary angioplasty of the left anterior descending. EF 45%  .  Carotid artery disease (HCC)   . DVT (deep venous thrombosis) (HCC)    X1  . History of nuclear stress test    a. Myoview 6/17: EF 20-25%, mid anteroseptal, apical anterior, apical septal, apical inferior, apical lateral and apical scar, no ischemia, intermediate risk  . HTN (hypertension)   . Hyperlipidemia   . Hypothyroidism   . Ischemic cardiomyopathy    a. Echo 6/17: EF 20-25%, apex appears akinetic, MAC, moderate MR, moderate LAE, mild RVE, trivial PI, PASP 47 mmHg (needs repeat with Definity contrast)   . MI (myocardial infarction) (HCC) 1992   at age 75; TIA treated with coumadin until Plavix initiated in 2006  . PAD (peripheral artery disease)  (HCC)    Right SFA occlusion, severe disease left CFA and SFA    Tobacco Use: History  Smoking Status  . Former Smoker  . Quit date: 03/11/1990  Smokeless Tobacco  . Never Used    Comment: smoked 1973- 1992, up to < 5 cigarettes    Labs: Recent Review Flowsheet Data    Labs for ITP Cardiac and Pulmonary Rehab Latest Ref Rng & Units 05/09/2014 05/10/2014 08/21/2015 09/06/2015 09/06/2015   Cholestrol 0 - 200 mg/dL 161 - - - -   LDLCALC 0 - 99 mg/dL 80 - - - -   HDL >09.60 mg/dL 45.40 - - - -   Trlycerides 0.0 - 149.0 mg/dL 98.1 - - - -   Hemoglobin A1c 4.6 - 6.5 % - 5.6 5.6 - -   PHART 7.350 - 7.450 - - - - 7.367   PCO2ART 35.0 - 45.0 mmHg - - - - 37.1   HCO3 20.0 - 24.0 mEq/L - - - 21.8 21.3   TCO2 0 - 100 mmol/L - - - 23 22   ACIDBASEDEF 0.0 - 2.0 mmol/L - - - 4.0(H) 4.0(H)   O2SAT % - - - 57.0 94.0      Capillary Blood Glucose: No results found for: GLUCAP   Exercise Target Goals: Date: 10/03/15  Exercise Program Goal: Individual exercise prescription set with THRR, safety & activity barriers. Participant demonstrates ability to understand and report RPE using BORG scale, to self-measure pulse accurately, and to acknowledge the importance of the exercise prescription.  Exercise Prescription Goal: Starting with aerobic activity 30 plus minutes a day, 3 days per week for initial exercise prescription. Provide home exercise prescription and guidelines that participant acknowledges understanding prior to discharge.  Activity Barriers & Risk Stratification:     Activity Barriers & Cardiac Risk Stratification - 10/04/15 1405      Activity Barriers & Cardiac Risk Stratification   Activity Barriers Balance Concerns;Assistive Device;Other (comment)   Comments hx left ankle fx   Cardiac Risk Stratification High      6 Minute Walk:     6 Minute Walk    Row Name 10/03/15 1533         6 Minute Walk   Phase Initial     Distance 600 feet     Walk Time 6 minutes     # of  Rest Breaks 0     MPH 1.14     METS 0.91     RPE 11     VO2 Peak 3.21     Symptoms No     Resting HR 80 bpm     Resting BP 120/70     Max Ex. HR 98 bpm     Max Ex. BP 108/64     2  Minute Post BP 116/78        Initial Exercise Prescription:     Initial Exercise Prescription - 10/04/15 0900      Date of Initial Exercise RX and Referring Provider   Date 10/03/15   Referring Provider Earney Hamburg , MD     Bike   Level 0.5   Minutes 10   METs 1.99     NuStep   Level 1   Minutes 10   METs 1.8     Track   Laps 6   Minutes 10   METs 2.03     Prescription Details   Frequency (times per week) 3   Duration Progress to 30 minutes of continuous aerobic without signs/symptoms of physical distress     Intensity   THRR 40-80% of Max Heartrate 58-116   Ratings of Perceived Exertion 11-13   Perceived Dyspnea 0-4     Progression   Progression Continue to progress workloads to maintain intensity without signs/symptoms of physical distress.     Resistance Training   Training Prescription Yes   Weight 1 lbs   Reps 10-12      Perform Capillary Blood Glucose checks as needed.  Exercise Prescription Changes:   Exercise Comments:   Discharge Exercise Prescription (Final Exercise Prescription Changes):   Nutrition:  Target Goals: Understanding of nutrition guidelines, daily intake of sodium 1500mg , cholesterol 200mg , calories 30% from fat and 7% or less from saturated fats, daily to have 5 or more servings of fruits and vegetables.  Biometrics:     Pre Biometrics - 10/04/15 0944      Pre Biometrics   Height  (1.702 m)   Weight 208 lb 15.9 oz (94.8 kg)   Waist Circumference 35.5 inches   Hip Circumference 48 inches   Waist to Hip Ratio 0.74 %   BMI (Calculated) 32.8   Triceps Skinfold 58 mm   % Body Fat 45.7 %   Grip Strength 35 kg   Flexibility 9.5 in   Single Leg Stand 1.21 seconds       Nutrition Therapy Plan and Nutrition  Goals:   Nutrition Discharge: Nutrition Scores:   Nutrition Goals Re-Evaluation:   Psychosocial: Target Goals: Acknowledge presence or absence of depression, maximize coping skills, provide positive support system. Participant is able to verbalize types and ability to use techniques and skills needed for reducing stress and depression.  Initial Review & Psychosocial Screening:     Initial Psych Review & Screening - 10/03/15 1639      Family Dynamics   Good Support System? Yes     Barriers   Psychosocial barriers to participate in program The patient should benefit from training in stress management and relaxation.     Screening Interventions   Interventions Encouraged to exercise      Quality of Life Scores:   PHQ-9: Recent Review Flowsheet Data    Depression screen [facility] 2/9 12/15/2014 05/14/2013   Decreased Interest 0 0   Down, Depressed, Hopeless 0 0   PHQ - 2 Score 0 0      Psychosocial Evaluation and Intervention:   Psychosocial Re-Evaluation:   Vocational Rehabilitation: Provide vocational rehab assistance to qualifying candidates.   Vocational Rehab Evaluation & Intervention:     Vocational Rehab - 10/03/15 1638      Initial Vocational Rehab Evaluation & Intervention   Assessment shows need for Vocational Rehabilitation No      Education: Education Goals: Education classes will be provided on a  weekly basis, covering required topics. Participant will state understanding/return demonstration of topics presented.  Learning Barriers/Preferences:     Learning Barriers/Preferences - 10/04/15 1406      Learning Barriers/Preferences   Learning Barriers Hearing;Sight   Learning Preferences Written Material;Pictoral;Video      Education Topics: Count Your Pulse:  -Group instruction provided by verbal instruction, demonstration, patient participation and written materials to support subject.  Instructors address importance of being able to find your  pulse and how to count your pulse when at home without a heart monitor.  Patients get hands on experience counting their pulse with staff help and individually.   Heart Attack, Angina, and Risk Factor Modification:  -Group instruction provided by verbal instruction, video, and written materials to support subject.  Instructors address signs and symptoms of angina and heart attacks.    Also discuss risk factors for heart disease and how to make changes to improve heart health risk factors.   Functional Fitness:  -Group instruction provided by verbal instruction, demonstration, patient participation, and written materials to support subject.  Instructors address safety measures for doing things around the house.  Discuss how to get up and down off the floor, how to pick things up properly, how to safely get out of a chair without assistance, and balance training.   Meditation and Mindfulness:  -Group instruction provided by verbal instruction, patient participation, and written materials to support subject.  Instructor addresses importance of mindfulness and meditation practice to help reduce stress and improve awareness.  Instructor also leads participants through a meditation exercise.    Stretching for Flexibility and Mobility:  -Group instruction provided by verbal instruction, patient participation, and written materials to support subject.  Instructors lead participants through series of stretches that are designed to increase flexibility thus improving mobility.  These stretches are additional exercise for major muscle groups that are typically performed during regular warm up and cool down.   Hands Only CPR Anytime:  -Group instruction provided by verbal instruction, video, patient participation and written materials to support subject.  Instructors co-teach with AHA video for hands only CPR.  Participants get hands on experience with mannequins.   Nutrition I class: Heart Healthy  Eating:  -Group instruction provided by PowerPoint slides, verbal discussion, and written materials to support subject matter. The instructor gives an explanation and review of the Therapeutic Lifestyle Changes diet recommendations, which includes a discussion on lipid goals, dietary fat, sodium, fiber, plant stanol/sterol esters, sugar, and the components of a well-balanced, healthy diet.   Nutrition II class: Lifestyle Skills:  -Group instruction provided by PowerPoint slides, verbal discussion, and written materials to support subject matter. The instructor gives an explanation and review of label reading, grocery shopping for heart health, heart healthy recipe modifications, and ways to make healthier choices when eating out.   Diabetes Question & Answer:  -Group instruction provided by PowerPoint slides, verbal discussion, and written materials to support subject matter. The instructor gives an explanation and review of diabetes co-morbidities, pre- and post-prandial blood glucose goals, pre-exercise blood glucose goals, signs, symptoms, and treatment of hypoglycemia and hyperglycemia, and foot care basics.   Diabetes Blitz:  -Group instruction provided by PowerPoint slides, verbal discussion, and written materials to support subject matter. The instructor gives an explanation and review of the physiology behind type 1 and type 2 diabetes, diabetes medications and rational behind using different medications, pre- and post-prandial blood glucose recommendations and Hemoglobin A1c goals, diabetes diet, and exercise including blood glucose guidelines  for exercising safely.    Portion Distortion:  -Group instruction provided by PowerPoint slides, verbal discussion, written materials, and food models to support subject matter. The instructor gives an explanation of serving size versus portion size, changes in portions sizes over the last 20 years, and what consists of a serving from each food  group.   Stress Management:  -Group instruction provided by verbal instruction, video, and written materials to support subject matter.  Instructors review role of stress in heart disease and how to cope with stress positively.     Exercising on Your Own:  -Group instruction provided by verbal instruction, power point, and written materials to support subject.  Instructors discuss benefits of exercise, components of exercise, frequency and intensity of exercise, and end points for exercise.  Also discuss use of nitroglycerin and activating EMS.  Review options of places to exercise outside of rehab.  Review guidelines for sex with heart disease.   Cardiac Drugs I:  -Group instruction provided by verbal instruction and written materials to support subject.  Instructor reviews cardiac drug classes: antiplatelets, anticoagulants, beta blockers, and statins.  Instructor discusses reasons, side effects, and lifestyle considerations for each drug class.   Cardiac Drugs II:  -Group instruction provided by verbal instruction and written materials to support subject.  Instructor reviews cardiac drug classes: angiotensin converting enzyme inhibitors (ACE-I), angiotensin II receptor blockers (ARBs), nitrates, and calcium channel blockers.  Instructor discusses reasons, side effects, and lifestyle considerations for each drug class.   Anatomy and Physiology of the Circulatory System:  -Group instruction provided by verbal instruction, video, and written materials to support subject.  Reviews functional anatomy of heart, how it relates to various diagnoses, and what role the heart plays in the overall system.   Knowledge Questionnaire Score:   Core Components/Risk Factors/Patient Goals at Admission:     Personal Goals and Risk Factors at Admission - 10/03/15 1623      Core Components/Risk Factors/Patient Goals on Admission    Weight Management Weight Loss   Intervention Weight Management: Develop  a combined nutrition and exercise program designed to reach desired caloric intake, while maintaining appropriate intake of nutrient and fiber, sodium and fats, and appropriate energy expenditure required for the weight goal.;Weight Management/Obesity: Establish reasonable short term and long term weight goals.;Weight Management: Provide education and appropriate resources to help participant work on and attain dietary goals.   Admit Weight 207 lb 3.7 oz (94 kg)   Expected Outcomes Weight Loss: Understanding of general recommendations for a balanced deficit meal plan, which promotes 1-2 lb weight loss per week and includes a negative energy balance of [phone] kcal/d;Understanding of distribution of calorie intake throughout the day with the consumption of 4-5 meals/snacks   Personal Goal Other Yes   Personal Goal SHORT TERM:  weight loss; LONG TERM:  get back to exercise in [employer] program.    Intervention develop invidualized exercise prescription fo aerobic and resistive training baased on initial evaluation findings, risk stratification, and comorbidities, provide nutritional education and caloric needs planning.    Expected Outcomes achievement of increased cardiorespiratory fitness and enhanced flexability, muscular endurance and strength shown through measurements of functional capacity and personal statement of participant.       Core Components/Risk Factors/Patient Goals Review:    Core Components/Risk Factors/Patient Goals at Discharge (Final Review):    ITP Comments:     ITP Comments    Row Name 10/03/15 1438  ITP Comments Medical Director- Armanda Magic, MD          Comments: Patient attended orientation from 1330 to 1530 to review rules and guidelines for program. Completed 6 minute walk test, Intitial ITP, and exercise prescription.  VSS. Telemetry-chronic atrial fibrillation .  Asymptomatic.Ms Norum used a wheelchair for stability.Gladstone Lighter,  RN,BSN 10/05/2015 12:43 PM

## 2015-10-06 ENCOUNTER — Ambulatory Visit (HOSPITAL_COMMUNITY): Payer: Commercial Managed Care - HMO | Attending: Internal Medicine

## 2015-10-06 DIAGNOSIS — E785 Hyperlipidemia, unspecified: Secondary | ICD-10-CM | POA: Insufficient documentation

## 2015-10-06 DIAGNOSIS — E039 Hypothyroidism, unspecified: Secondary | ICD-10-CM | POA: Diagnosis not present

## 2015-10-06 DIAGNOSIS — I252 Old myocardial infarction: Secondary | ICD-10-CM | POA: Insufficient documentation

## 2015-10-06 DIAGNOSIS — R29898 Other symptoms and signs involving the musculoskeletal system: Secondary | ICD-10-CM | POA: Diagnosis not present

## 2015-10-06 DIAGNOSIS — I2510 Atherosclerotic heart disease of native coronary artery without angina pectoris: Secondary | ICD-10-CM | POA: Insufficient documentation

## 2015-10-06 DIAGNOSIS — I119 Hypertensive heart disease without heart failure: Secondary | ICD-10-CM | POA: Diagnosis not present

## 2015-10-06 DIAGNOSIS — I255 Ischemic cardiomyopathy: Secondary | ICD-10-CM | POA: Insufficient documentation

## 2015-10-06 MED ORDER — PERFLUTREN LIPID MICROSPHERE
1.0000 mL | INTRAVENOUS | Status: AC | PRN
  Administered 2015-10-06: 2 mL via INTRAVENOUS

## 2015-10-09 ENCOUNTER — Encounter (HOSPITAL_COMMUNITY)
Admission: RE | Admit: 2015-10-09 | Discharge: 2015-10-09 | Disposition: A | Discharge status: Home / Self Care | Payer: Commercial Managed Care - HMO | Source: Ambulatory Visit | Attending: Cardiovascular Disease | Admitting: Cardiovascular Disease

## 2015-10-09 ENCOUNTER — Ambulatory Visit: Payer: Commercial Managed Care - HMO | Admitting: Physician Assistant

## 2015-10-09 ENCOUNTER — Encounter (HOSPITAL_COMMUNITY): Payer: Self-pay

## 2015-10-09 DIAGNOSIS — Z955 Presence of coronary angioplasty implant and graft: Secondary | ICD-10-CM

## 2015-10-09 NOTE — Progress Notes (Signed)
Daily Session Note  Patient Details  Name: Yvonne Bailey MRN: [id number] Date of Birth: [birth year] Referring Provider:   Flowsheet Row CARDIAC REHAB PHASE II ORIENTATION from 10/03/2015 in [facility]  Referring Provider  Darlina Guys , MD      Encounter Date: 10/09/2015  Check In:     Session Check In - 10/09/15 1527      Check-In   Location [facility]   Staff Present Su Hilt, MS, ACSM RCEP, Exercise Physiologist;Olinty Ferrelview, MS, ACSM CEP, Exercise Physiologist;Amber Fair, MS, ACSM RCEP, Exercise Physiologist;Rawad Bochicchio, RN, BSN   Supervising physician immediately available to respond to emergencies [facility] immediately available   Physician(s) Dr. Marily Memos   Medication changes reported     No   Fall or balance concerns reported    No   Warm-up and Cool-down Performed as group-led instruction   Resistance Training Performed Yes   VAD Patient? No     Pain Assessment   Currently in Pain? No/denies   Multiple Pain Sites No      Capillary Blood Glucose: No results found for this or any previous visit (from the past 24 hour(s)).   Goals Met:  Exercise tolerated well  Goals Unmet:  Not Applicable  Comments: Pt started cardiac rehab today.  Pt tolerated light exercise without difficulty. VSS, telemetry-sinus rhythm, asymptomatic.  Medication list reconciled. Pt denies barriers to medicaiton compliance.  PSYCHOSOCIAL ASSESSMENT:  PHQ-0. Pt exhibits positive coping skills, hopeful outlook with supportive family. No psychosocial needs identified at this time, no psychosocial interventions necessary.    Pt enjoys playing cards with friends. Pt goals for cardiac rehab include weight loss and learning better dietary habits.  Pt encouraged to participate in nutrition education offerings.   Pt oriented to exercise equipment and routine.    Understanding verbalized.   Dr. Fransico Him is Medical Director for  Cardiac Rehab at [facility].

## 2015-10-10 ENCOUNTER — Telehealth: Payer: Self-pay | Admitting: *Deleted

## 2015-10-10 NOTE — Telephone Encounter (Signed)
Pt aware of echo results and findings. Pt agreeable to repeat echo 10/3 at 3 pm.

## 2015-10-11 ENCOUNTER — Encounter (HOSPITAL_COMMUNITY)
Admission: RE | Admit: 2015-10-11 | Discharge: 2015-10-11 | Disposition: A | Discharge status: Home / Self Care | Payer: Commercial Managed Care - HMO | Source: Ambulatory Visit | Attending: Cardiovascular Disease | Admitting: Cardiovascular Disease

## 2015-10-11 DIAGNOSIS — Z955 Presence of coronary angioplasty implant and graft: Secondary | ICD-10-CM | POA: Diagnosis not present

## 2015-10-13 ENCOUNTER — Encounter (HOSPITAL_COMMUNITY)
Admission: RE | Admit: 2015-10-13 | Discharge: 2015-10-13 | Disposition: A | Discharge status: Home / Self Care | Payer: Commercial Managed Care - HMO | Source: Ambulatory Visit | Attending: Cardiovascular Disease | Admitting: Cardiovascular Disease

## 2015-10-13 DIAGNOSIS — Z955 Presence of coronary angioplasty implant and graft: Secondary | ICD-10-CM

## 2015-10-13 NOTE — Progress Notes (Signed)
Reviewed home exercise guidelines with patient including endpoints, temperature precautions, target heart rate and rate of perceived exertion. Pt previously exercised 3 days per week at the [facility] but has returned to exercise there yet. Pt plans to walk as her mode of home exercise at this time. Pt voices understanding of instructions given. Artist Pais, MS, ACSM CCEP

## 2015-10-16 ENCOUNTER — Encounter (HOSPITAL_COMMUNITY)
Admission: RE | Admit: 2015-10-16 | Discharge: 2015-10-16 | Disposition: A | Discharge status: Home / Self Care | Payer: Commercial Managed Care - HMO | Source: Ambulatory Visit | Attending: Cardiovascular Disease | Admitting: Cardiovascular Disease

## 2015-10-16 DIAGNOSIS — Z955 Presence of coronary angioplasty implant and graft: Secondary | ICD-10-CM

## 2015-10-16 NOTE — Progress Notes (Signed)
[name]'s weight is up today from Friday 1.7 KG. [name] did not take her diuretic over the weekend she was at a family reunion. [name] plans to resume her diuretic today. Upon assessment lung fields clear upon ascultation. Oxygen saturation 99% on room air. Will continue to monitor the patient throughout  the program.Yvonne Harlon Flor, RN,BSN 10/16/2015 5:50 PM

## 2015-10-18 ENCOUNTER — Encounter (HOSPITAL_COMMUNITY)
Admission: RE | Admit: 2015-10-18 | Discharge: 2015-10-18 | Disposition: A | Discharge status: Home / Self Care | Payer: Commercial Managed Care - HMO | Source: Ambulatory Visit | Attending: Cardiovascular Disease | Admitting: Cardiovascular Disease

## 2015-10-18 DIAGNOSIS — Z955 Presence of coronary angioplasty implant and graft: Secondary | ICD-10-CM

## 2015-10-20 ENCOUNTER — Encounter (HOSPITAL_COMMUNITY)
Admission: RE | Admit: 2015-10-20 | Discharge: 2015-10-20 | Disposition: A | Discharge status: Home / Self Care | Payer: Commercial Managed Care - HMO | Source: Ambulatory Visit | Attending: Cardiovascular Disease | Admitting: Cardiovascular Disease

## 2015-10-20 DIAGNOSIS — Z955 Presence of coronary angioplasty implant and graft: Secondary | ICD-10-CM | POA: Diagnosis not present

## 2015-10-23 ENCOUNTER — Encounter (HOSPITAL_COMMUNITY): Payer: Commercial Managed Care - HMO

## 2015-10-25 ENCOUNTER — Encounter (HOSPITAL_COMMUNITY)
Admission: RE | Admit: 2015-10-25 | Discharge: 2015-10-25 | Disposition: A | Discharge status: Home / Self Care | Payer: Commercial Managed Care - HMO | Source: Ambulatory Visit | Attending: Cardiovascular Disease | Admitting: Cardiovascular Disease

## 2015-10-25 DIAGNOSIS — Z955 Presence of coronary angioplasty implant and graft: Secondary | ICD-10-CM

## 2015-10-25 NOTE — Progress Notes (Signed)
Cardiac Individual Treatment Plan  Patient Details  Name: Yvonne Bailey MRN: [id number] Date of Birth: 09-Jul-1940 Referring Provider:   Flowsheet Row CARDIAC REHAB PHASE II ORIENTATION from 10/03/2015 in [facility]  Referring Provider  Darlina Guys , MD      Initial Encounter Date:  Flowsheet Row CARDIAC REHAB PHASE II ORIENTATION from 10/03/2015 in [place]  Date  10/03/15  Referring Provider  Darlina Guys , MD      Visit Diagnosis: Status post coronary artery stent placement  Patient's Home Medications on Admission:  Current Outpatient Prescriptions:  .  albuterol (VENTOLIN HFA) 108 (90 Base) MCG/ACT inhaler, Inhale 1-2 puffs into the lungs every 6 (six) hours as needed for wheezing or shortness of breath., Disp: 18 g, Rfl: 1 .  apixaban (ELIQUIS) 5 MG TABS tablet, Take 1 tablet (5 mg total) by mouth 2 (two) times daily., Disp: 180 tablet, Rfl: 2 .  Ascorbic Acid (VITAMIN C) 100 MG tablet, Take 100 mg by mouth daily.  , Disp: , Rfl:  .  aspirin EC 81 MG EC tablet, Take 1 tablet (81 mg total) by mouth daily., Disp: , Rfl:  .  atorvastatin (LIPITOR) 40 MG tablet, Take 1 tablet (40 mg total) by mouth daily., Disp: 90 tablet, Rfl: 3 .  benazepril (LOTENSIN) 20 MG tablet, TAKE 1 TABLET EVERY DAY, Disp: 90 tablet, Rfl: 2 .  brimonidine (ALPHAGAN) 0.2 % ophthalmic solution, PLACE ONE DROPTO THE LEFT EYE TWICE  DAILY, Disp: , Rfl: 0 .  cholecalciferol (VITAMIN D) 1000 UNITS tablet, Take 1,000 Units by mouth daily., Disp: , Rfl:  .  clopidogrel (PLAVIX) 75 MG tablet, Take 1 tablet (75 mg total) by mouth daily., Disp: 30 tablet, Rfl: 12 .  fish oil-omega-3 fatty acids 1000 MG capsule, Take 1,000 mg by mouth daily. , Disp: , Rfl:  .  furosemide (LASIX) 40 MG tablet, Take 1 tablet (40 mg total) by mouth 2 (two) times daily., Disp: 60 tablet, Rfl: 12 .  latanoprost (XALATAN) 0.005 % ophthalmic solution, Place 0.005 drops  into both eyes every evening., Disp: , Rfl:  .  levothyroxine (SYNTHROID, LEVOTHROID) 88 MCG tablet, Take 1 tablet (88 mcg total) by mouth daily., Disp: 90 tablet, Rfl: 3 .  metoprolol succinate (TOPROL-XL) 50 MG 24 hr tablet, Take 1 tablet (50 mg total) by mouth 2 (two) times daily. Take with or immediately following a meal., Disp: 180 tablet, Rfl: 1 .  Multiple Vitamin (MULTIVITAMIN) tablet, Take 1 tablet by mouth daily.  , Disp: , Rfl:  .  nitroGLYCERIN (NITROSTAT) 0.4 MG SL tablet, Place 1 tablet (0.4 mg total) under the tongue every 5 (five) minutes as needed for chest pain., Disp: 25 tablet, Rfl: 1 .  Potassium Chloride ER 20 MEQ TBCR, Take 20 mEq by mouth daily., Disp: 90 tablet, Rfl: 3 .  spironolactone (ALDACTONE) 25 MG tablet, Take 1 tablet (25 mg total) by mouth daily., Disp: 30 tablet, Rfl: 12 .  TOVIAZ 8 MG TB24 tablet, Take 8 mg by mouth daily. , Disp: , Rfl:  .  vitamin E 100 UNIT capsule, Take 100 Units by mouth daily.  , Disp: , Rfl:   Past Medical History: Past Medical History:  Diagnosis Date  . CAD (coronary artery disease)    S/P  anterior  wall myocardial infarction in '92, treated w/percutaneous transluminal coronary angioplasty of the left anterior descending. EF 45%  . Carotid artery disease ([place])   .  DVT (deep venous thrombosis) ([place])    X1  . History of nuclear stress test    a. Myoview 6/17: EF 20-25%, mid anteroseptal, apical anterior, apical septal, apical inferior, apical lateral and apical scar, no ischemia, intermediate risk  . HTN (hypertension)   . Hyperlipidemia   . Hypothyroidism   . Ischemic cardiomyopathy    a. Echo 6/17: EF 20-25%, apex appears akinetic, MAC, moderate MR, moderate LAE, mild RVE, trivial PI, PASP 47 mmHg (needs repeat with Definity contrast) //  b. Limited echo with Definity contrast 7/17: EF 25-30%, moderate to severe LAE  . MI (myocardial infarction) ([place]) 1992   at age 56; TIA treated with coumadin until Plavix initiated in 2006  .  PAD (peripheral artery disease) ([facility])    Right SFA occlusion, severe disease left CFA and SFA    Tobacco Use: History  Smoking Status  . Former Smoker  . Quit date: 03/11/1990  Smokeless Tobacco  . Never Used    Comment: smoked 1973- 1992, up to < 5 cigarettes    Labs: Recent Review Flowsheet Data    Labs for ITP Cardiac and Pulmonary Rehab Latest Ref Rng & Units 05/09/2014 05/10/2014 08/21/2015 09/06/2015 09/06/2015   Cholestrol 0 - 200 mg/dL 138 - - - -   LDLCALC 0 - 99 mg/dL 80 - - - -   HDL >39.00 mg/dL 44.30 - - - -   Trlycerides 0.0 - 149.0 mg/dL 71.0 - - - -   Hemoglobin A1c 4.6 - 6.5 % - 5.6 5.6 - -   PHART 7.350 - 7.450 - - - - 7.367   PCO2ART 35.0 - 45.0 mmHg - - - - 37.1   HCO3 20.0 - 24.0 mEq/L - - - 21.8 21.3   TCO2 0 - 100 mmol/L - - - 23 22   ACIDBASEDEF 0.0 - 2.0 mmol/L - - - 4.0(H) 4.0(H)   O2SAT % - - - 57.0 94.0      Capillary Blood Glucose: No results found for: GLUCAP   Exercise Target Goals:    Exercise Program Goal: Individual exercise prescription set with THRR, safety & activity barriers. Participant demonstrates ability to understand and report RPE using BORG scale, to self-measure pulse accurately, and to acknowledge the importance of the exercise prescription.  Exercise Prescription Goal: Starting with aerobic activity 30 plus minutes a day, 3 days per week for initial exercise prescription. Provide home exercise prescription and guidelines that participant acknowledges understanding prior to discharge.  Activity Barriers & Risk Stratification:     Activity Barriers & Cardiac Risk Stratification - 10/04/15 1405      Activity Barriers & Cardiac Risk Stratification   Activity Barriers Balance Concerns;Assistive Device;Other (comment)   Comments hx left ankle fx   Cardiac Risk Stratification High      6 Minute Walk:     6 Minute Walk    Row Name 10/03/15 1533         6 Minute Walk   Phase Initial     Distance 600 feet     Walk Time 6  minutes     # of Rest Breaks 0     MPH 1.14     METS 0.91     RPE 11     VO2 Peak 3.21     Symptoms No     Resting HR 80 bpm     Resting BP 120/70     Max Ex. HR 98 bpm     Max Ex.  BP 108/64     2 Minute Post BP 116/78        Initial Exercise Prescription:     Initial Exercise Prescription - 10/04/15 0900      Date of Initial Exercise RX and Referring Provider   Date 10/03/15   Referring Provider Darlina Guys , MD     Bike   Level 0.5   Minutes 10   METs 1.99     NuStep   Level 1   Minutes 10   METs 1.8     Track   Laps 6   Minutes 10   METs 2.03     Prescription Details   Frequency (times per week) 3   Duration Progress to 30 minutes of continuous aerobic without signs/symptoms of physical distress     Intensity   THRR 40-80% of Max Heartrate 58-116   Ratings of Perceived Exertion 11-13   Perceived Dyspnea 0-4     Progression   Progression Continue to progress workloads to maintain intensity without signs/symptoms of physical distress.     Resistance Training   Training Prescription Yes   Weight 1 lbs   Reps 10-12      Perform Capillary Blood Glucose checks as needed.  Exercise Prescription Changes:      Exercise Prescription Changes    Row Name 10/27/15 0700             Exercise Review   Progression Yes         Response to Exercise   Blood Pressure (Admit) 118/60       Blood Pressure (Exercise) 110/68       Blood Pressure (Exit) 112/70       Heart Rate (Admit) 81 bpm       Heart Rate (Exercise) 111 bpm       Heart Rate (Exit) 81 bpm       Rating of Perceived Exertion (Exercise) 13       Comments Reviewed home exercise guidelines on 10/13/15.       Duration Progress to 30 minutes of continuous aerobic without signs/symptoms of physical distress       Intensity THRR unchanged         Progression   Progression Continue to progress workloads to maintain intensity without signs/symptoms of physical distress.       Average METs 1.9          Resistance Training   Training Prescription Yes       Weight 3lbs       Reps 10-12         Interval Training   Interval Training No         Bike   Level -       Minutes -       METs -         NuStep   Level 3       Minutes 10       METs 1.7         Arm Ergometer   Level 2       Minutes 10         Track   Laps 6       Minutes 10       METs 2.04         Home Exercise Plan   Plans to continue exercise at [employer] (comment)       Frequency Add 3 additional days to program exercise sessions.  Exercise Comments:      Exercise Comments    Row Name 10/27/15 0744           Exercise Comments Latrisha is walking 2 days per week at home [facility]           Discharge Exercise Prescription (Final Exercise Prescription Changes):     Exercise Prescription Changes - 10/27/15 0700      Exercise Review   Progression Yes     Response to Exercise   Blood Pressure (Admit) 118/60   Blood Pressure (Exercise) 110/68   Blood Pressure (Exit) 112/70   Heart Rate (Admit) 81 bpm   Heart Rate (Exercise) 111 bpm   Heart Rate (Exit) 81 bpm   Rating of Perceived Exertion (Exercise) 13   Comments Reviewed home exercise guidelines on 10/13/15.   Duration Progress to 30 minutes of continuous aerobic without signs/symptoms of physical distress   Intensity THRR unchanged     Progression   Progression Continue to progress workloads to maintain intensity without signs/symptoms of physical distress.   Average METs 1.9     Resistance Training   Training Prescription Yes   Weight 3lbs   Reps 10-12     Interval Training   Interval Training No     Bike   Level --   Minutes --   METs --     NuStep   Level 3   Minutes 10   METs 1.7     Arm Ergometer   Level 2   Minutes 10     Track   Laps 6   Minutes 10   METs 2.04     Home Exercise Plan   Plans to continue exercise at [employer] [employer]  (comment)   Frequency Add 3 additional days to program exercise sessions.      Nutrition:  Target Goals: Understanding of nutrition guidelines, daily intake of sodium '1500mg'$ , cholesterol '200mg'$ , calories 30% from fat and 7% or less from saturated fats, daily to have 5 or more servings of fruits and vegetables.  Biometrics:     Pre Biometrics - 10/04/15 0944      Pre Biometrics   Height '5\' 7"'$  (1.702 m)   Weight 208 lb 15.9 oz (94.8 kg)   Waist Circumference 35.5 inches   Hip Circumference 48 inches   Waist to Hip Ratio 0.74 %   BMI (Calculated) 32.8   Triceps Skinfold 58 mm   % Body Fat 45.7 %   Grip Strength 35 kg   Flexibility 9.5 in   Single Leg Stand 1.21 seconds       Nutrition Therapy Plan and Nutrition Goals:     Nutrition Therapy & Goals - 10/05/15 1501      Nutrition Therapy   Diet Therapeutic Lifestyle Changes     Personal Nutrition Goals   Personal Goal #1 1-2 lb wt loss/week to a goal wt loss of 6-24 lb at graduation from [place], educate and counsel regarding individualized specific dietary modifications aiming towards targeted core components such as weight, hypertension, lipid management, diabetes, heart failure and other comorbidities.   Expected Outcomes Short Term Goal: Understand basic principles of dietary content, such as calories, fat, sodium, cholesterol and nutrients.;Long Term Goal: Adherence to prescribed nutrition plan.      Nutrition Discharge: Nutrition Scores:     Nutrition Assessments - 10/05/15 1501  MEDFICTS Scores   Pre Score --  pt has not returned nutrition survey at this time      Nutrition Goals Re-Evaluation:   Psychosocial: Target Goals: Acknowledge presence or absence of depression, maximize coping skills, provide positive support system. Participant is able to verbalize types and ability to use techniques and skills needed for reducing stress and  depression.  Initial Review & Psychosocial Screening:     Initial Psych Review & Screening - 10/03/15 1639      Family Dynamics   Good Support System? Yes     Barriers   Psychosocial barriers to participate in program The patient should benefit from training in stress management and relaxation.     Screening Interventions   Interventions Encouraged to exercise      Quality of Life Scores:     Quality of Life - 10/10/15 0750      Quality of Life Scores   Health/Function Pre 28 %   Socioeconomic Pre 29.29 %   Psych/Spiritual Pre 28.29 %   Family Pre 30 %   GLOBAL Pre 28.59 %      PHQ-9: Recent Review Flowsheet Data    Depression screen [facility] 2/9 10/09/2015 12/15/2014 05/14/2013   Decreased Interest 0 0 0   Down, Depressed, Hopeless 0 0 0   PHQ - 2 Score 0 0 0      Psychosocial Evaluation and Intervention:   Psychosocial Re-Evaluation:     Psychosocial Re-Evaluation    Row Name 10/25/15 1716             Psychosocial Re-Evaluation   Interventions Encouraged to attend Cardiac Rehabilitation for the exercise       Continued Psychosocial Services Needed No          Vocational Rehabilitation: Provide vocational rehab assistance to qualifying candidates.   Vocational Rehab Evaluation & Intervention:     Vocational Rehab - 10/03/15 1638      Initial Vocational Rehab Evaluation & Intervention   Assessment shows need for Vocational Rehabilitation No      Education: Education Goals: Education classes will be provided on a weekly basis, covering required topics. Participant will state understanding/return demonstration of topics presented.  Learning Barriers/Preferences:     Learning Barriers/Preferences - 10/04/15 1406      Learning Barriers/Preferences   Learning Barriers Hearing;Sight   Learning Preferences Written Material;Pictoral;Video      Education Topics: Count Your Pulse:  -Group instruction provided by verbal instruction, demonstration,  patient participation and written materials to support subject.  Instructors address importance of being able to find your pulse and how to count your pulse when at home without a heart monitor.  Patients get hands on experience counting their pulse with staff help and individually.   Heart Attack, Angina, and Risk Factor Modification:  -Group instruction provided by verbal instruction, video, and written materials to support subject.  Instructors address signs and symptoms of angina and heart attacks.    Also discuss risk factors for heart disease and how to make changes to improve heart health risk factors.   Functional Fitness:  -Group instruction provided by verbal instruction, demonstration, patient participation, and written materials to support subject.  Instructors address safety measures for doing things around the house.  Discuss how to get up and down off the floor, how to pick things up properly, how to safely get out of a chair without assistance, and balance training.   Meditation and Mindfulness:  -Group instruction provided by verbal instruction, patient  participation, and written materials to support subject.  Instructor addresses importance of mindfulness and meditation practice to help reduce stress and improve awareness.  Instructor also leads participants through a meditation exercise.    Stretching for Flexibility and Mobility:  -Group instruction provided by verbal instruction, patient participation, and written materials to support subject.  Instructors lead participants through series of stretches that are designed to increase flexibility thus improving mobility.  These stretches are additional exercise for major muscle groups that are typically performed during regular warm up and cool down.   Hands Only CPR Anytime:  -Group instruction provided by verbal instruction, video, patient participation and written materials to support subject.  Instructors co-teach with [facility]  video for hands only CPR.  Participants get hands on experience with mannequins.   Nutrition I class: Heart Healthy Eating:  -Group instruction provided by PowerPoint slides, verbal discussion, and written materials to support subject matter. The instructor gives an explanation and review of the Therapeutic Lifestyle Changes diet recommendations, which includes a discussion on lipid goals, dietary fat, sodium, fiber, plant stanol/sterol esters, sugar, and the components of a well-balanced, healthy diet.   Nutrition II class: Lifestyle Skills:  -Group instruction provided by PowerPoint slides, verbal discussion, and written materials to support subject matter. The instructor gives an explanation and review of label reading, grocery shopping for heart health, heart healthy recipe modifications, and ways to make healthier choices when eating out.   Diabetes Question & Answer:  -Group instruction provided by PowerPoint slides, verbal discussion, and written materials to support subject matter. The instructor gives an explanation and review of diabetes co-morbidities, pre- and post-prandial blood glucose goals, pre-exercise blood glucose goals, signs, symptoms, and treatment of hypoglycemia and hyperglycemia, and foot care basics. Flowsheet Row CARDIAC REHAB PHASE II EXERCISE from 10/20/2015 in [place]  Date  10/20/15  Educator  RD  Instruction Review Code  2- meets goals/outcomes      Diabetes Blitz:  -Group instruction provided by PowerPoint slides, verbal discussion, and written materials to support subject matter. The instructor gives an explanation and review of the physiology behind type 1 and type 2 diabetes, diabetes medications and rational behind using different medications, pre- and post-prandial blood glucose recommendations and Hemoglobin A1c goals, diabetes diet, and exercise including blood glucose guidelines for exercising safely.    Portion  Distortion:  -Group instruction provided by PowerPoint slides, verbal discussion, written materials, and food models to support subject matter. The instructor gives an explanation of serving size versus portion size, changes in portions sizes over the last 20 years, and what consists of a serving from each food group.   Stress Management:  -Group instruction provided by verbal instruction, video, and written materials to support subject matter.  Instructors review role of stress in heart disease and how to cope with stress positively.   Flowsheet Row CARDIAC REHAB PHASE II EXERCISE from 10/20/2015 in [place]  Date  10/11/15  Educator  Andi Hence, RN  Instruction Review Code  2- meets goals/outcomes      Exercising on Your Own:  -Group instruction provided by verbal instruction, power point, and written materials to support subject.  Instructors discuss benefits of exercise, components of exercise, frequency and intensity of exercise, and end points for exercise.  Also discuss use of nitroglycerin and activating EMS.  Review options of places to exercise outside of rehab.  Review guidelines for sex with heart disease.   Cardiac Drugs  I:  -Group instruction provided by verbal instruction and written materials to support subject.  Instructor reviews cardiac drug classes: antiplatelets, anticoagulants, beta blockers, and statins.  Instructor discusses reasons, side effects, and lifestyle considerations for each drug class.   Cardiac Drugs II:  -Group instruction provided by verbal instruction and written materials to support subject.  Instructor reviews cardiac drug classes: angiotensin converting enzyme inhibitors (ACE-I), angiotensin II receptor blockers (ARBs), nitrates, and calcium channel blockers.  Instructor discusses reasons, side effects, and lifestyle considerations for each drug class.   Anatomy and Physiology of the Circulatory System:  -Group  instruction provided by verbal instruction, video, and written materials to support subject.  Reviews functional anatomy of heart, how it relates to various diagnoses, and what role the heart plays in the overall system.   Knowledge Questionnaire Score:     Knowledge Questionnaire Score - 10/10/15 0750      Knowledge Questionnaire Score   Pre Score 21/24      Core Components/Risk Factors/Patient Goals at Admission:     Personal Goals and Risk Factors at Admission - 10/03/15 1623      Core Components/Risk Factors/Patient Goals on Admission    Weight Management Weight Loss   Intervention Weight Management: Develop a combined nutrition and exercise program designed to reach desired caloric intake, while maintaining appropriate intake of nutrient and fiber, sodium and fats, and appropriate energy expenditure required for the weight goal.;Weight Management/Obesity: Establish reasonable short term and long term weight goals.;Weight Management: Provide education and appropriate resources to help participant work on and attain dietary goals.   Admit Weight 207 lb 3.7 oz (94 kg)   Expected Outcomes Weight Loss: Understanding of general recommendations for a balanced deficit meal plan, which promotes 1-2 lb weight loss per week and includes a negative energy balance of [phone] kcal/d;Understanding of distribution of calorie intake throughout the day with the consumption of 4-5 meals/snacks   Personal Goal Other Yes   Personal Goal SHORT TERM:  weight loss; LONG TERM:  get back to exercise in [facility] program.    Intervention develop invidualized exercise prescription fo aerobic and resistive training baased on initial evaluation findings, risk stratification, and comorbidities, provide nutritional education and caloric needs planning.    Expected Outcomes achievement of increased cardiorespiratory fitness and enhanced flexability, muscular endurance and strength shown through measurements of  functional capacity and personal statement of participant.       Core Components/Risk Factors/Patient Goals Review:      Goals and Risk Factor Review    Row Name 10/27/15 [phone]             Core Components/Risk Factors/Patient Goals Review   Personal Goals Review Weight Management/Obesity;Other       Review Patient is making excellent progress with exercise. She is walking 2 days per week at home and [facility] has [facility] 4.2 lbs since admission.       Expected Outcomes Achieve weight loss and fitness goals through regular exercise program at least 30 minutes 5-7 days per week.          Core Components/Risk Factors/Patient Goals at Discharge (Final Review):      Goals and Risk Factor Review - 10/27/15 0742      Core Components/Risk Factors/Patient Goals Review   Personal Goals Review Weight Management/Obesity;Other   Review Patient is making excellent progress with exercise. She is walking 2 days per week at home and [facility] has lost 4.2 lbs since admission.   Expected Outcomes Achieve weight loss and fitness goals through regular exercise program at least 30 minutes 5-7 days per week.      ITP Comments:     ITP Comments    Row Name 10/03/15 1438 10/13/15 1540         ITP Comments Medical Director- Fransico Him, MD attended Hypertension video/lecture education offering/met outcome, goals         Comments: Aliani is making expected progress toward personal goals after completing 7sessions. Recommend continued exercise and life style modification education including  stress management and relaxation techniques to decrease cardiac risk profile.Kallie is doing good with weight loss and exercise. Barnet Pall, RN,BSN 10/27/2015 9:17 AM

## 2015-10-27 ENCOUNTER — Encounter (HOSPITAL_COMMUNITY)
Admission: RE | Admit: 2015-10-27 | Discharge: 2015-10-27 | Disposition: A | Discharge status: Home / Self Care | Payer: Commercial Managed Care - HMO | Source: Ambulatory Visit | Attending: Cardiovascular Disease | Admitting: Cardiovascular Disease

## 2015-10-27 DIAGNOSIS — Z955 Presence of coronary angioplasty implant and graft: Secondary | ICD-10-CM

## 2015-10-30 ENCOUNTER — Encounter (HOSPITAL_COMMUNITY)
Admission: RE | Admit: 2015-10-30 | Discharge: 2015-10-30 | Disposition: A | Discharge status: Home / Self Care | Payer: Commercial Managed Care - HMO | Source: Ambulatory Visit | Attending: Cardiovascular Disease | Admitting: Cardiovascular Disease

## 2015-10-30 DIAGNOSIS — Z955 Presence of coronary angioplasty implant and graft: Secondary | ICD-10-CM | POA: Diagnosis not present

## 2015-11-01 ENCOUNTER — Encounter (HOSPITAL_COMMUNITY)
Admission: RE | Admit: 2015-11-01 | Discharge: 2015-11-01 | Disposition: A | Discharge status: Home / Self Care | Payer: Commercial Managed Care - HMO | Source: Ambulatory Visit | Attending: Cardiovascular Disease | Admitting: Cardiovascular Disease

## 2015-11-01 DIAGNOSIS — Z955 Presence of coronary angioplasty implant and graft: Secondary | ICD-10-CM | POA: Diagnosis not present

## 2015-11-02 ENCOUNTER — Ambulatory Visit (INDEPENDENT_AMBULATORY_CARE_PROVIDER_SITE_OTHER): Payer: Commercial Managed Care - HMO

## 2015-11-02 VITALS — BP 120/76 | HR 56 | Ht 66.5 in | Wt 211.0 lb

## 2015-11-02 DIAGNOSIS — Z0000 Encounter for general adult medical examination without abnormal findings: Secondary | ICD-10-CM

## 2015-11-02 NOTE — Patient Instructions (Addendum)
Ms. Townsel , Thank you for taking time to come for your Medicare Wellness Visit. I appreciate your ongoing commitment to your health goals. Please review the following plan we discussed and let me know if I can assist you in the future.   Will consider mammogram this year; [facility]   These are the goals we discussed: Goals    . Weight (lb) < 200 lb (90.7 kg)          Exercise at the Cardiac rehab Uses stretch bands;  Drinks a lot of water Eat fish;  Will seek dietician in Cardiac rehab     . Weight < 180 lb (81.647 kg)          Not sure how to lose the weight and agree that MNT may assist her. Diabetes and weight loss;  [email or web address]  [facility]  No reviews  [facility]  [address] [id number]  [phone]  [facility]abetes [facility] Address: [address], [place], [place] [address]  Phone: [phone]        This is a list of the screening recommended for you and due dates:  Health Maintenance  Topic Date Due  . Tetanus Vaccine  07/11/1959  . Shingles Vaccine  07/10/2000  . DEXA scan (bone density measurement)  07/10/2005  . Flu Shot  10/10/2015  . Colon Cancer Screening  12/22/2016  . Pneumonia vaccines  Completed       Fat and Cholesterol Restricted Diet Getting too much fat and cholesterol in your diet may cause health problems. Following this diet helps keep your fat and cholesterol at normal levels. This can keep you from getting sick. WHAT TYPES OF FAT SHOULD I CHOOSE?  Choose monosaturated and polyunsaturated fats. These are found in foods such as olive oil, canola oil, flaxseeds, walnuts, almonds, and seeds.  Eat more omega-3 fats. Good choices include salmon, mackerel, sardines, tuna, flaxseed oil, and ground flaxseeds.  Limit saturated fats. These are in animal products such as meats, butter, and cream. They can also be in plant products such as palm oil,  palm kernel oil, and coconut oil.   Avoid foods with partially hydrogenated oils in them. These contain trans fats. Examples of foods that have trans fats are stick margarine, some tub margarines, cookies, crackers, and other baked goods. WHAT GENERAL GUIDELINES DO I NEED TO FOLLOW?   Check food labels. Look for the words "trans fat" and "saturated fat."  When preparing a meal:  Fill half of your plate with vegetables and green salads.  Fill one fourth of your plate with whole grains. Look for the word "whole" as the first word in the ingredient list.  Fill one fourth of your plate with lean protein foods.  Limit fruit to two servings a day. Choose fruit instead of juice.  Eat more foods with soluble fiber. Examples of foods with this type of fiber are apples, broccoli, carrots, beans, peas, and barley. Try to get 20-30 g (grams) of fiber per day.  Eat more home-cooked foods. Eat less at restaurants and buffets.  Limit or avoid alcohol.  Limit foods high in starch and sugar.  Limit fried foods.  Cook foods without frying them. Baking, boiling, grilling, and broiling are all great options.  Lose weight if you are overweight. Losing even a small amount of weight can help your overall health. It can also help prevent diseases such as diabetes and  heart disease. WHAT FOODS CAN I EAT? Grains Whole grains, such as whole wheat or whole grain breads, crackers, cereals, and pasta. Unsweetened oatmeal, bulgur, barley, quinoa, or brown rice. Corn or whole wheat flour tortillas. Vegetables Fresh or frozen vegetables (raw, steamed, roasted, or grilled). Green salads. Fruits All fresh, canned (in natural juice), or frozen fruits. Meat and Other Protein Products Ground beef (85% or leaner), grass-fed beef, or beef trimmed of fat. Skinless chicken or [place]. Ground chicken or [place]. Pork trimmed of fat. All fish and seafood. Eggs. Dried beans, peas, or lentils. Unsalted nuts or seeds. Unsalted  canned or dry beans. Dairy Low-fat dairy products, such as skim or 1% milk, 2% or reduced-fat cheeses, low-fat ricotta or cottage cheese, or plain low-fat yogurt. Fats and Oils Tub margarines without trans fats. Light or reduced-fat mayonnaise and salad dressings. Avocado. Olive, canola, sesame, or safflower oils. Natural peanut or almond butter (choose ones without added sugar and oil). The items listed above may not be a complete list of recommended foods or beverages. Contact your dietitian for more options. WHAT FOODS ARE NOT RECOMMENDED? Grains White bread. White pasta. White rice. Cornbread. Bagels, pastries, and croissants. Crackers that contain trans fat. Vegetables White potatoes. Corn. Creamed or fried vegetables. Vegetables in a cheese sauce. Fruits Dried fruits. Canned fruit in light or heavy syrup. Fruit juice. Meat and Other Protein Products Fatty cuts of meat. Ribs, chicken wings, bacon, sausage, bologna, salami, chitterlings, fatback, hot dogs, bratwurst, and packaged luncheon meats. Liver and organ meats. Dairy Whole or 2% milk, cream, half-and-half, and cream cheese. Whole milk cheeses. Whole-fat or sweetened yogurt. Full-fat cheeses. Nondairy creamers and whipped toppings. Processed cheese, cheese spreads, or cheese curds. Sweets and Desserts Corn syrup, sugars, honey, and molasses. Candy. Jam and jelly. Syrup. Sweetened cereals. Cookies, pies, cakes, donuts, muffins, and ice cream. Fats and Oils Butter, stick margarine, lard, shortening, ghee, or bacon fat. Coconut, palm kernel, or palm oils. Beverages Alcohol. Sweetened drinks (such as sodas, lemonade, and fruit drinks or punches). The items listed above may not be a complete list of foods and beverages to avoid. Contact your dietitian for more information.   This information is not intended to replace advice given to you by your health care provider. Make sure you discuss any questions you have with your health care  provider.   Document Released: 08/27/2011 Document Revised: 03/18/2014 Document Reviewed: 05/27/2013 Elsevier Interactive Patient Education 2016 [place] in the Home  Falls can cause injuries. They can happen to people of all ages. There are many things you can do to make your home safe and to help prevent falls.  WHAT CAN I DO ON THE OUTSIDE OF MY HOME?  Regularly fix the edges of walkways and driveways and fix any cracks.  Remove anything that might make you trip as you walk through a door, such as a raised step or threshold.  Trim any bushes or trees on the path to your home.  Use bright outdoor lighting.  Clear any walking paths of anything that might make someone trip, such as rocks or tools.  Regularly check to see if handrails are loose or broken. Make sure that both sides of any steps have handrails.  Any raised decks and porches should have guardrails on the edges.  Have any leaves, snow, or ice cleared regularly.  Use sand or salt on walking paths during winter.  Clean up any spills in your garage right away. This includes oil  or grease spills. WHAT CAN I DO IN THE BATHROOM?   Use night lights.  Install grab bars by the toilet and in the tub and shower. Do not use towel bars as grab bars.  Use non-skid mats or decals in the tub or shower.  If you need to sit down in the shower, use a plastic, non-slip stool.  Keep the floor dry. Clean up any water that spills on the floor as soon as it happens.  Remove soap buildup in the tub or shower regularly.  Attach bath mats securely with double-sided non-slip rug tape.  Do not have throw rugs and other things on the floor that can make you trip. WHAT CAN I DO IN THE BEDROOM?  Use night lights.  Make sure that you have a light by your bed that is easy to reach.  Do not use any sheets or blankets that are too big for your bed. They should not hang down onto the floor.  Have a firm chair that  has side arms. You can use this for support while you get dressed.  Do not have throw rugs and other things on the floor that can make you trip. WHAT CAN I DO IN THE KITCHEN?  Clean up any spills right away.  Avoid walking on wet floors.  Keep items that you use a lot in easy-to-reach places.  If you need to reach something above you, use a strong step stool that has a grab bar.  Keep electrical cords out of the way.  Do not use floor polish or wax that makes floors slippery. If you must use wax, use non-skid floor wax.  Do not have throw rugs and other things on the floor that can make you trip. WHAT CAN I DO WITH MY STAIRS?  Do not leave any items on the stairs.  Make sure that there are handrails on both sides of the stairs and use them. Fix handrails that are broken or loose. Make sure that handrails are as long as the stairways.  Check any carpeting to make sure that it is firmly attached to the stairs. Fix any carpet that is loose or worn.  Avoid having throw rugs at the top or bottom of the stairs. If you do have throw rugs, attach them to the floor with carpet tape.  Make sure that you have a light switch at the top of the stairs and the bottom of the stairs. If you do not have them, ask someone to add them for you. WHAT ELSE CAN I DO TO HELP PREVENT FALLS?  Wear shoes that:  Do not have high heels.  Have rubber bottoms.  Are comfortable and fit you well.  Are closed at the toe. Do not wear sandals.  If you use a stepladder:  Make sure that it is fully opened. Do not climb a closed stepladder.  Make sure that both sides of the stepladder are locked into place.  Ask someone to hold it for you, if possible.  Clearly mark and make sure that you can see:  Any grab bars or handrails.  First and last steps.  Where the edge of each step is.  Use tools that help you move around (mobility aids) if they are needed. These  include:  Canes.  Walkers.  Scooters.  Crutches.  Turn on the lights when you go into a dark area. Replace any light bulbs as soon as they burn out.  Set up your furniture so you  have a clear path. Avoid moving your furniture around.  If any of your floors are uneven, fix them.  If there are any pets around you, be aware of where they are.  Review your medicines with your doctor. Some medicines can make you feel dizzy. This can increase your chance of falling. Ask your doctor what other things that you can do to help prevent falls.   This information is not intended to replace advice given to you by your health care provider. Make sure you discuss any questions you have with your health care provider.   Document Released: 12/22/2008 Document Revised: 07/12/2014 Document Reviewed: 04/01/2014 Elsevier Interactive Patient Education 2016 [place] Maintenance, Female Adopting a healthy lifestyle and getting preventive care can go a long way to promote health and wellness. Talk with your health care provider about what schedule of regular examinations is right for you. This is a good chance for you to check in with your provider about disease prevention and staying healthy. In between checkups, there are plenty of things you can do on your own. Experts have done a lot of research about which lifestyle changes and preventive measures are most likely to keep you healthy. Ask your health care provider for more information. WEIGHT AND DIET  Eat a healthy diet  Be sure to include plenty of vegetables, fruits, low-fat dairy products, and lean protein.  Do not eat a lot of foods high in solid fats, added sugars, or salt.  Get regular exercise. This is one of the most important things you can do for your health.  Most adults should exercise for at least 150 minutes each week. The exercise should increase your heart rate and make you sweat (moderate-intensity exercise).  Most  adults should also do strengthening exercises at least twice a week. This is in addition to the moderate-intensity exercise.  Maintain a healthy weight  Body mass index (BMI) is a measurement that can be used to identify possible weight problems. It estimates body fat based on height and weight. Your health care provider can help determine your BMI and help you achieve or maintain a healthy weight.  For females 71 years of age and older:   A BMI below 18.5 is considered underweight.  A BMI of 18.5 to 24.9 is normal.  A BMI of 25 to 29.9 is considered overweight.  A BMI of 30 and above is considered obese.  Watch levels of cholesterol and blood lipids  You should start having your blood tested for lipids and cholesterol at 75 years of age, then have this test every 5 years.  You may need to have your cholesterol levels checked more often if:  Your lipid or cholesterol levels are high.  You are older than 75 years of age.  You are at high risk for heart disease.  CANCER SCREENING   Lung Cancer  Lung cancer screening is recommended for adults 46-47 years old who are at high risk for lung cancer because of a history of smoking.  A yearly low-dose CT scan of the lungs is recommended for people who:  Currently smoke.  Have quit within the past 15 years.  Have at least a 30-pack-year history of smoking. A pack year is smoking an average of one pack of cigarettes a day for 1 year.  Yearly screening should continue until it has been 15 years since you quit.  Yearly screening should stop if you develop a health problem that would prevent you  from having lung cancer treatment.  Breast Cancer  Practice breast self-awareness. This means understanding how your breasts normally appear and feel.  It also means doing regular breast self-exams. Let your health care provider know about any changes, no matter how small.  If you are in your 20s or 30s, you should have a clinical  breast exam (CBE) by a health care provider every 1-3 years as part of a regular health exam.  If you are 32 or older, have a CBE every year. Also consider having a breast X-ray (mammogram) every year.  If you have a family history of breast cancer, talk to your health care provider about genetic screening.  If you are at high risk for breast cancer, talk to your health care provider about having an MRI and a mammogram every year.  Breast cancer gene (BRCA) assessment is recommended for women who have family members with BRCA-related cancers. BRCA-related cancers include:  Breast.  Ovarian.  Tubal.  Peritoneal cancers.  Results of the assessment will determine the need for genetic counseling and BRCA1 and BRCA2 testing. Cervical Cancer Your health care provider may recommend that you be screened regularly for cancer of the pelvic organs (ovaries, uterus, and vagina). This screening involves a pelvic examination, including checking for microscopic changes to the surface of your cervix (Pap test). You may be encouraged to have this screening done every 3 years, beginning at age 33.  For women ages 43-65, health care providers may recommend pelvic exams and Pap testing every 3 years, or they may recommend the Pap and pelvic exam, combined with testing for human papilloma virus (HPV), every 5 years. Some types of HPV increase your risk of cervical cancer. Testing for HPV may also be done on women of any age with unclear Pap test results.  Other health care providers may not recommend any screening for nonpregnant women who are considered low risk for pelvic cancer and who do not have symptoms. Ask your health care provider if a screening pelvic exam is right for you.  If you have had past treatment for cervical cancer or a condition that could lead to cancer, you need Pap tests and screening for cancer for at least 20 years after your treatment. If Pap tests have been discontinued, your risk  factors (such as having a new sexual partner) need to be reassessed to determine if screening should resume. Some women have medical problems that increase the chance of getting cervical cancer. In these cases, your health care provider may recommend more frequent screening and Pap tests. Colorectal Cancer  This type of cancer can be detected and often prevented.  Routine colorectal cancer screening usually begins at 75 years of age and continues through 75 years of age.  Your health care provider may recommend screening at an earlier age if you have risk factors for colon cancer.  Your health care provider may also recommend using home test kits to check for hidden blood in the stool.  A small camera at the end of a tube can be used to examine your colon directly (sigmoidoscopy or colonoscopy). This is done to check for the earliest forms of colorectal cancer.  Routine screening usually begins at age 54.  Direct examination of the colon should be repeated every 5-10 years through 75 years of age. However, you may need to be screened more often if early forms of precancerous polyps or small growths are found. Skin Cancer  Check your skin from head to toe  regularly.  Tell your health care provider about any new moles or changes in moles, especially if there is a change in a mole's shape or color.  Also tell your health care provider if you have a mole that is larger than the size of a pencil eraser.  Always use sunscreen. Apply sunscreen liberally and repeatedly throughout the day.  Protect yourself by wearing long sleeves, pants, a wide-brimmed hat, and sunglasses whenever you are outside. HEART DISEASE, DIABETES, AND HIGH BLOOD PRESSURE   High blood pressure causes heart disease and increases the risk of stroke. High blood pressure is more likely to develop in:  People who have blood pressure in the high end of the normal range (130-139/85-89 mm Hg).  People who are overweight or  obese.  People who are African American.  If you are 8-33 years of age, have your blood pressure checked every 3-5 years. If you are 73 years of age or older, have your blood pressure checked every year. You should have your blood pressure measured twice--once when you are at a hospital or clinic, and once when you are not at a hospital or clinic. Record the average of the two measurements. To check your blood pressure when you are not at a hospital or clinic, you can use:  An automated blood pressure machine at a pharmacy.  A home blood pressure monitor.  If you are between 26 years and 58 years old, ask your health care provider if you should take aspirin to prevent strokes.  Have regular diabetes screenings. This involves taking a blood sample to check your fasting blood sugar level.  If you are at a normal weight and have a low risk for diabetes, have this test once every three years after 75 years of age.  If you are overweight and have a high risk for diabetes, consider being tested at a younger age or more often. PREVENTING INFECTION  Hepatitis B  If you have a higher risk for hepatitis B, you should be screened for this virus. You are considered at high risk for hepatitis B if:  You were born in a country where hepatitis B is common. Ask your health care provider which countries are considered high risk.  Your parents were born in a high-risk country, and you have not been immunized against hepatitis B (hepatitis B vaccine).  You have HIV or AIDS.  You use needles to inject street drugs.  You live with someone who has hepatitis B.  You have had sex with someone who has hepatitis B.  You get hemodialysis treatment.  You take certain medicines for conditions, including cancer, organ transplantation, and autoimmune conditions. Hepatitis C  Blood testing is recommended for:  Everyone born from [birth year] through 1965.  Anyone with known risk factors for hepatitis  C. Sexually transmitted infections (STIs)  You should be screened for sexually transmitted infections (STIs) including gonorrhea and chlamydia if:  You are sexually active and are younger than 75 years of age.  You are older than 75 years of age and your health care provider tells you that you are at risk for this type of infection.  Your sexual activity has changed since you were last screened and you are at an increased risk for chlamydia or gonorrhea. Ask your health care provider if you are at risk.  If you do not have HIV, but are at risk, it may be recommended that you take a prescription medicine daily to prevent HIV infection. This is  called pre-exposure prophylaxis (PrEP). You are considered at risk if:  You are sexually active and do not regularly use condoms or know the HIV status of your partner(s).  You take drugs by injection.  You are sexually active with a partner who has HIV. Talk with your health care provider about whether you are at high risk of being infected with HIV. If you choose to begin PrEP, you should first be tested for HIV. You should then be tested every 3 months for as long as you are taking PrEP.  PREGNANCY   If you are premenopausal and you may become pregnant, ask your health care provider about preconception counseling.  If you may become pregnant, take 400 to 800 micrograms (mcg) of folic acid every day.  If you want to prevent pregnancy, talk to your health care provider about birth control (contraception). OSTEOPOROSIS AND MENOPAUSE   Osteoporosis is a disease in which the bones lose minerals and strength with aging. This can result in serious bone fractures. Your risk for osteoporosis can be identified using a bone density scan.  If you are 63 years of age or older, or if you are at risk for osteoporosis and fractures, ask your health care provider if you should be screened.  Ask your health care provider whether you should take a calcium or  vitamin D supplement to lower your risk for osteoporosis.  Menopause may have certain physical symptoms and risks.  Hormone replacement therapy may reduce some of these symptoms and risks. Talk to your health care provider about whether hormone replacement therapy is right for you.  HOME CARE INSTRUCTIONS   Schedule regular health, dental, and eye exams.  Stay current with your immunizations.   Do not use any tobacco products including cigarettes, chewing tobacco, or electronic cigarettes.  If you are pregnant, do not drink alcohol.  If you are breastfeeding, limit how much and how often you drink alcohol.  Limit alcohol intake to no more than 1 drink per day for nonpregnant women. One drink equals 12 ounces of beer, 5 ounces of wine, or 1 ounces of hard liquor.  Do not use street drugs.  Do not share needles.  Ask your health care provider for help if you need support or information about quitting drugs.  Tell your health care provider if you often feel depressed.  Tell your health care provider if you have ever been abused or do not feel safe at home.   This information is not intended to replace advice given to you by your health care provider. Make sure you discuss any questions you have with your health care provider.   Document Released: 09/10/2010 Document Revised: 03/18/2014 Document Reviewed: 01/27/2013 Elsevier Interactive Patient Education [employer].

## 2015-11-02 NOTE — Progress Notes (Addendum)
Subjective:   Yvonne Bailey is a 75 y.o. female who presents for Medicare Annual (Subsequent) preventive examination.  HRA assessment completed during this visit with Ms. Shartzer  The Patient was informed that the wellness visit is to identify future health risk and educate and initiate measures that can reduce risk for increased disease through the lifespan.    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 Angelena Form and tested and then ended up with 5 stents;    Former smoker; smoked 5' to 77'; < 5 cigarettes    Medications reviewed for issues; compliance; otc meds  BMI: 33.5   Diet Is on fluid pills  Breakfast; eat cereal;  Lunch; eat a sandwich; grilled chicken; fruit Supper; meat and vegetables; Casseroles Does not fry anything  Benefit of losing weight would be to get into clothes  Spent time discussing weight loss; "grazing" and snacking;    Dental work: have upper dentures;  Has teeth cleaned   Exercise;   Cardiac rehab x 3; Stepper and other exercise with arms  Walks around her parking lot / 10 minutes  Is planning on going back to silver sneakers 60 + minutes x3 days and 10 minutes walking every day Does house work   Meds: was told to ask dr. Quay Burow about [place] Ply but states it did not help and really does not feel she needs this  [place] term plan reviewed  One level home Home: level; barriers; or needs identified as bathroom railing or other review; The bathroom in her bedroom has a step to get in shower; does not have a chair in there for now;  No rails  Fall hx; no   Given  education on "Fall Prevention in the Home" for more safety tips the patient can apply as appropriate.   Personal safety issues reviewed:  1.  for risk such as safe community/ yes  2.  smoke detector/ yes 3.  firearms safety if applicable /  4. protection when in the sun; not in the sun 5. driving safety for seniors or any recent accidents.no    Risk for Depression reviewed: Any emotional problems? Anxious, depressed, irritable, sad or blue? no Denies feeling depressed or hopeless; voices pleasure in daily life How many social activities have you been engaged in within the last 2 weeks? no   Cognitive; memory issues;   Manages checkbook, medications; no failures of task Ad8 score reviewed for issues;  Issues making decisions; no  Less interest in hobbies / activities" no  Repeats questions, stories; family complaining: NO  Trouble using ordinary gadgets; microwave; computer: no  Forgets the month or year: no  Mismanaging finances: no  Missing apt: no but does write them down  Daily problems with thinking of memory NO Ad8 score is 0  MMSE not appropriate unless AD8 score is > 2   Advanced Directive addressed; mentioned this but she does not have one  Given a copy today and resources for [facility] at [facility]   Counseling Health Maintenance Gaps: TD or Tdap: had a tetanus when she cut herself many  years  Will come in if she had a cut Declines today Zostavax- cost was prohibitive; again will check on OOP this year in medical benefit  Dexa Scan- declines for now  Has not had a flu shot / declines   Colonoscopy; due 12/22/2016 EKG: 09/2015 Mammogram: 03/2012/ didn't have one last year Never had a problem   Dexa/ 03/2010 Continue calcium 600 mg two times a day & @ least 1000 International Units vitamin D once daily. Walk 30 min 3-4X/week. Recheck BMD in 25 months.  PAP: educated regarding the need for GYN exam;     Hearing: Difficulty hearing out of left ear Has  apt for hearing screen   Ophthalmology exam/ [facility]. Kathlen [facility]/ + may need laser/ first one worked for [employer] is good!  Immunizations Due: (Vaccines reviewed and educated regarding any overdue)    Established and updated Risk reviewed and appropriate referral made or health recommendations:  Current Care Team reviewed and updated    Cardiac Risk Factors include: advanced age (>38mn, >>55women);dyslipidemia;family history of premature cardiovascular disease;hypertension;obesity (BMI >30kg/m2)     Objective:     Vitals: BP 120/76   Pulse (!) 56   Ht 5' 6.5" (1.689 m)   Wt 211 lb (95.7 kg)   SpO2 98%   BMI 33.55 kg/m   Body mass index is 33.55 kg/m.   Tobacco History  Smoking Status  . Former Smoker  . Quit date: 03/11/1990  Smokeless Tobacco  . Never Used    Comment: smoked 1973- 1992, up to < 5 cigarettes     Counseling given: Yes   Past Medical History:  Diagnosis Date  . CAD (coronary artery disease)    S/P  anterior  wall myocardial infarction in '92, treated w/percutaneous transluminal coronary angioplasty of the left anterior descending. EF 45%  . [place] artery [place] ([facility] [place]   . DVT (deep venous thrombosis) ([facility]    X1  . History of nuclear stress test    a. Myoview 6/17: EF 20-25%, mid anteroseptal, apical anterior, apical septal, apical inferior, apical lateral and apical scar, no ischemia, intermediate risk  . HTN (hypertension)   . Hyperlipidemia   . Hypothyroidism   . Ischemic cardiomyopathy    a. Echo 6/17: EF 20-25%, apex appears akinetic, MAC, moderate MR, moderate LAE, mild RVE, trivial PI, PASP 47 mmHg (needs repeat with Definity contrast) //  b. Limited echo with Definity contrast 7/17: EF 25-30%, moderate to severe LAE  . [place] [place]   at age 75 TIA treated with coumadin until Plavix initiated in 2006  . PAD (peripheral artery disease) (HCC)    Right SFA occlusion, severe disease left  CFA and SFA   Past Surgical History:  Procedure Laterality Date  . APPENDECTOMY     at hysterectomy and USO for fibroids, Dr. HYsidro Evert . CARDIAC CATHETERIZATION  1992   Dr BEustace Quail . CARDIAC CATHETERIZATION N/A 09/06/2015   Procedure: Right/Left Heart Cath and Coronary Angiography;  Surgeon: CBurnell Blanks MD;  [facility];  Service: Cardiovascular;  Laterality: N/A;  . CARDIAC CATHETERIZATION N/A 09/07/2015   Procedure: Coronary Stent Intervention;  Surgeon: CBurnell Blanks MD;  [facility];  Service: Cardiovascular;  Laterality: N/A;  . COLONOSCOPY     negative; 2008, Dr. DDelfin Edis . fracture LLE     '94; pinned  . TONSILLECTOMY    . TOTAL ABDOMINAL HYSTERECTOMY     &  BSO for fibroids   Family History  Problem Relation Age of Onset  . Diabetes Mother   . Hypertension Mother   . Stroke Brother     ?> 55  . Coronary artery disease Brother     stent in 94s  . Cancer Neg Hx    History  Sexual Activity  . Sexual activity: Not on file    Outpatient Encounter Prescriptions as of 11/02/2015  Medication Sig  . apixaban (ELIQUIS) 5 MG TABS tablet Take 1 tablet (5 mg total) by mouth 2 (two) times daily.  . Ascorbic Acid (VITAMIN C) 100 MG tablet Take 100 mg by mouth daily.    Marland Kitchen atorvastatin (LIPITOR) 40 MG tablet Take 1 tablet (40 mg total) by mouth daily.  . benazepril (LOTENSIN) 20 MG tablet TAKE 1 TABLET EVERY DAY  . brimonidine (ALPHAGAN) 0.2 % ophthalmic solution PLACE ONE DROPTO THE LEFT EYE TWICE  DAILY  . cholecalciferol (VITAMIN D) 1000 UNITS tablet Take 1,000 Units by mouth daily.  . clopidogrel (PLAVIX) 75 MG tablet Take 1 tablet (75 mg total) by mouth daily.  . fish oil-omega-3 fatty acids 1000 MG capsule Take 1,000 mg by mouth daily.   . furosemide (LASIX) 40 MG tablet Take 1 tablet (40 mg total) by mouth 2 (two) times daily.  Marland Kitchen latanoprost (XALATAN) 0.005 % ophthalmic solution Place 0.005 drops into both eyes  every evening.  Marland Kitchen levothyroxine (SYNTHROID, LEVOTHROID) 88 MCG tablet Take 1 tablet (88 mcg total) by mouth daily.  . metoprolol succinate (TOPROL-XL) 50 MG 24 hr tablet Take 1 tablet (50 mg total) by mouth 2 (two) times daily. Take with or immediately following a meal.  . Multiple Vitamin (MULTIVITAMIN) tablet Take 1 tablet by mouth daily.    . nitroGLYCERIN (NITROSTAT) 0.4 MG SL tablet Place 1 tablet (0.4 mg total) under the tongue every 5 (five) minutes as needed for chest pain.  Marland Kitchen Potassium Chloride ER 20 MEQ TBCR Take 20 mEq by mouth daily.  Marland Kitchen spironolactone (ALDACTONE) 25 MG tablet Take 1 tablet (25 mg total) by mouth daily.  . vitamin E 100 UNIT capsule Take 100 Units by mouth daily.    Marland Kitchen albuterol (VENTOLIN HFA) 108 (90 Base) MCG/ACT inhaler Inhale 1-2 puffs into the lungs every 6 (six) hours as needed for wheezing or shortness of breath. (Patient not taking: Reported on 11/02/2015)  . aspirin EC 81 MG EC tablet Take 1 tablet (81 mg total) by mouth daily. (Patient not taking: Reported on 11/02/2015)  . TOVIAZ 8 MG TB24 tablet Take 8 mg by mouth daily.    No facility-administered encounter medications on file as of 11/02/2015.     Activities of Daily Living In your present state of health, do you have any difficulty performing the following activities: 11/02/2015 09/06/2015  Hearing? N -  Vision? N -  Difficulty concentrating or making decisions? N -  Walking or climbing stairs? Y -  Dressing or bathing? N -  Doing errands, shopping? N Y  Conservation officer, nature and eating ? N -  Using the Toilet? N -  In the past six months, have you accidently leaked urine? N -  Do you have problems with loss of bowel control? N -  Managing your Medications? N -  Managing your Finances? N -  Housekeeping or managing your Housekeeping? N -  Some recent data might be hidden    Patient Care Team: Binnie Rail, MD as PCP - General (Internal Medicine)    Assessment:  What is the "Mediterranean" diet?    There's no one "Mediterranean" diet. At least 16 countries border the [employer]. Diets vary between these countries and also between regions within a country. Many differences in culture, ethnic background, religion, economy and agricultural production result in different diets. But the common Mediterranean dietary pattern has these characteristics: high consumption of fruits, vegetables, bread and other cereals, potatoes, beans, nuts and seeds  olive oil is an important monounsaturated fat source  dairy products, fish and poultry are consumed in low to moderate amounts, and little red meat is eaten  eggs are consumed zero to four times a week  wine is consumed in low to moderate amounts Does a Mediterranean-style diet follow American Heart Association dietary recommendations? Mediterranean-style diets are often close to our dietary recommendations, but they don't follow them exactly. In general, the diets of Mediterranean peoples contain a relatively high percentage of calories from fat. This is thought to contribute to the increasing obesity in these countries, which is becoming a concern. People who follow the average Mediterranean diet eat less saturated fat than those who eat the average American diet. In fact, saturated fat consumption is well within our dietary guidelines. More than half the fat calories in a Mediterranean diet come from monounsaturated fats (mainly from olive oil). Monounsaturated fat doesn't raise blood cholesterol levels the way saturated fat does. The incidence of heart disease in [place] countries is lower than in the [place]. Death rates are lower, too. But this may not be entirely due to the diet. Lifestyle factors (such as more physical activity and extended social support systems) may also play a part. Before advising people to follow a Mediterranean diet, we need more studies to find out whether the diet itself or other lifestyle factors account for  the lower deaths from heart disease.  American Heart Association    Weight loss strategies discussed;    Exercise Activities and Dietary recommendations Current Exercise Habits: Home exercise routine;Structured exercise class, Type of exercise: strength training/weights;walking, Time (Minutes): > 60, Frequency (Times/Week): 3, Weekly Exercise (Minutes/Week): 0, Intensity: Moderate  Goals    . Weight (lb) < 200 lb (90.7 kg)          Exercise at the Cardiac rehab Uses stretch bands;  Drinks a lot of water Eat fish;  Will seek dietician in Cardiac rehab     . Weight < 180 lb (81.647 kg)          Not sure how to lose the weight and agree that MNT may assist her. Diabetes and weight loss;  [email or web address]  Deaf & Hard of Hearing Division Services  No reviews  [facility]  [id number]  [phone]  Diabetes and medical nutrition thearpy Address: [address] [place], [place] [address]  Phone: [phone]       Fall Risk Fall Risk  11/02/2015 10/03/2015 12/15/2014 05/14/2013  Falls in the past year? No No Yes No  Number falls in past yr: - - 1 -  Injury with Fall? - - No -  Risk for fall due to : - Impaired balance/gait;Other (Comment) - -  Risk for fall due to (comments): - high rsk due to fall screening and single leg stand less than 5 seconds - -  Follow up - - Education provided -   Depression Screen PHQ 2/9 Scores 11/02/2015 10/09/2015 12/15/2014 05/14/2013  PHQ - 2 Score 0 0 0 0  Cognitive Testing MMSE - Mini Mental State Exam 11/02/2015  Not completed: (No Data)   Ad8 score 0   Immunization History  Administered Date(s) Administered  . Pneumococcal Conjugate-13 12/15/2014  . Pneumococcal Polysaccharide-23 08/31/2009   Screening Tests Health Maintenance  Topic Date Due  . ZOSTAVAX  07/10/2000  . INFLUENZA VACCINE  10/10/2015  . TETANUS/TDAP  10/09/2016 (Originally 07/11/1959)  . DEXA SCAN  10/31/2016  (Originally 07/10/2005)  . COLONOSCOPY  12/22/2016  . PNA vac Low Risk Adult  Completed      Plan:   [facility] offers free advance directive forms, as well as assistance in completing the forms themselves. For assistance, contact the Spiritual Care Department at [phone], or the Clinical Social Work Department at [phone].   [facility] [employer]; [phone] [address] [place]; [phone]  Deaf & Hard of Hearing Division Services - can assist with with one hearing aid  No reviews  [facility]  [place]  [phone] Needs hearing aid for left ear   Colonoscopy due 12/2016 but can discuss possible cologuard with Dr. Quay Burow   Tdap ( Tetanus with pertussis)   Regarding Zostervax (shingles)  Covered Part D Educated to check with insurance regarding coverage of Shingles vaccination on Part D or Part B and may have lower co-pay if provided on the Part D side Check to see if you have met your medical deductible on the medical side of your benefit    During the course of the visit the patient was educated and counseled about the following appropriate screening and preventive services:   Vaccines to include Pneumoccal, Influenza, Hepatitis B, Td, Zostavax, HCV  Declines Tdap and shingles but will check on medical OOP to see if she has met her deductible;   Electrocardiogram  Cardiovascular Disease  Colorectal cancer screening  Bone density screening 2012; Normal per Dr. Linna Darner; Postponed   Diabetes screening/neg  Glaucoma screening/ under treatment   Mammography/ agrees to fup this year   Nutrition counseling / spent 20 minutes counseling   Patient Instructions (the written plan) was given to the patient.   Wynetta Fines, RN  11/02/2015    Medical screening examination/treatment/procedure(s) were performed by non-physician practitioner and as supervising physician I was immediately available for consultation/collaboration. I agree with above. Binnie Rail, MD

## 2015-11-03 ENCOUNTER — Encounter (HOSPITAL_COMMUNITY)
Admission: RE | Admit: 2015-11-03 | Discharge: 2015-11-03 | Disposition: A | Discharge status: Home / Self Care | Payer: Commercial Managed Care - HMO | Source: Ambulatory Visit | Attending: Cardiovascular Disease | Admitting: Cardiovascular Disease

## 2015-11-03 DIAGNOSIS — Z955 Presence of coronary angioplasty implant and graft: Secondary | ICD-10-CM

## 2015-11-06 ENCOUNTER — Encounter (HOSPITAL_COMMUNITY)
Admission: RE | Admit: 2015-11-06 | Discharge: 2015-11-06 | Disposition: A | Discharge status: Home / Self Care | Payer: Commercial Managed Care - HMO | Source: Ambulatory Visit | Attending: Cardiovascular Disease | Admitting: Cardiovascular Disease

## 2015-11-06 DIAGNOSIS — Z955 Presence of coronary angioplasty implant and graft: Secondary | ICD-10-CM | POA: Diagnosis not present

## 2015-11-08 ENCOUNTER — Encounter (HOSPITAL_COMMUNITY)
Admission: RE | Admit: 2015-11-08 | Discharge: 2015-11-08 | Disposition: A | Discharge status: Home / Self Care | Payer: Commercial Managed Care - HMO | Source: Ambulatory Visit | Attending: Cardiovascular Disease | Admitting: Cardiovascular Disease

## 2015-11-08 DIAGNOSIS — Z955 Presence of coronary angioplasty implant and graft: Secondary | ICD-10-CM

## 2015-11-10 ENCOUNTER — Encounter (HOSPITAL_COMMUNITY)
Admission: RE | Admit: 2015-11-10 | Discharge: 2015-11-10 | Disposition: A | Discharge status: Home / Self Care | Payer: Commercial Managed Care - HMO | Source: Ambulatory Visit | Attending: Cardiovascular Disease | Admitting: Cardiovascular Disease

## 2015-11-10 DIAGNOSIS — Z955 Presence of coronary angioplasty implant and graft: Secondary | ICD-10-CM | POA: Diagnosis not present

## 2015-11-15 ENCOUNTER — Encounter (HOSPITAL_COMMUNITY): Payer: Commercial Managed Care - HMO

## 2015-11-17 ENCOUNTER — Encounter (HOSPITAL_COMMUNITY)
Admission: RE | Admit: 2015-11-17 | Discharge: 2015-11-17 | Disposition: A | Discharge status: Home / Self Care | Payer: Commercial Managed Care - HMO | Source: Ambulatory Visit | Attending: Cardiovascular Disease | Admitting: Cardiovascular Disease

## 2015-11-17 DIAGNOSIS — Z955 Presence of coronary angioplasty implant and graft: Secondary | ICD-10-CM

## 2015-11-20 ENCOUNTER — Encounter (HOSPITAL_COMMUNITY): Payer: Commercial Managed Care - HMO

## 2015-11-22 ENCOUNTER — Encounter (HOSPITAL_COMMUNITY)
Admission: RE | Admit: 2015-11-22 | Discharge: 2015-11-22 | Disposition: A | Discharge status: Home / Self Care | Payer: Commercial Managed Care - HMO | Source: Ambulatory Visit | Attending: Cardiovascular Disease | Admitting: Cardiovascular Disease

## 2015-11-22 DIAGNOSIS — Z955 Presence of coronary angioplasty implant and graft: Secondary | ICD-10-CM | POA: Diagnosis not present

## 2015-11-22 NOTE — Progress Notes (Signed)
Cardiac Individual Treatment Plan  Patient Details  Name: Yvonne Bailey MRN: [id number] Date of Birth: [birth year] Referring Provider:   Flowsheet Row CARDIAC REHAB PHASE II ORIENTATION from 10/03/2015 in [facility]  Referring Provider  Darlina Guys , MD      Initial Encounter Date:  Flowsheet Row CARDIAC REHAB PHASE II ORIENTATION from 10/03/2015 in [facility]  Date  10/03/15  Referring Provider  Darlina Guys , MD      Visit Diagnosis: Status post coronary artery stent placement  Patient's Home Medications on Admission:  Current Outpatient Prescriptions:  .  albuterol (VENTOLIN HFA) 108 (90 Base) MCG/ACT inhaler, Inhale 1-2 puffs into the lungs every 6 (six) hours as needed for wheezing or shortness of breath. (Patient not taking: Reported on 11/02/2015), Disp: 18 g, Rfl: 1 .  apixaban (ELIQUIS) 5 MG TABS tablet, Take 1 tablet (5 mg total) by mouth 2 (two) times daily., Disp: 180 tablet, Rfl: 2 .  Ascorbic Acid (VITAMIN C) 100 MG tablet, Take 100 mg by mouth daily.  , Disp: , Rfl:  .  aspirin EC 81 MG EC tablet, Take 1 tablet (81 mg total) by mouth daily. (Patient not taking: Reported on 11/02/2015), Disp: , Rfl:  .  atorvastatin (LIPITOR) 40 MG tablet, Take 1 tablet (40 mg total) by mouth daily., Disp: 90 tablet, Rfl: 3 .  benazepril (LOTENSIN) 20 MG tablet, TAKE 1 TABLET EVERY DAY, Disp: 90 tablet, Rfl: 2 .  brimonidine (ALPHAGAN) 0.2 % ophthalmic solution, PLACE ONE DROPTO THE LEFT EYE TWICE  DAILY, Disp: , Rfl: 0 .  cholecalciferol (VITAMIN D) 1000 UNITS tablet, Take 1,000 Units by mouth daily., Disp: , Rfl:  .  clopidogrel (PLAVIX) 75 MG tablet, Take 1 tablet (75 mg total) by mouth daily., Disp: 30 tablet, Rfl: 12 .  fish oil-omega-3 fatty acids 1000 MG capsule, Take 1,000 mg by mouth daily. , Disp: , Rfl:  .  furosemide (LASIX) 40 MG tablet, Take 1 tablet (40 mg total) by mouth 2 (two) times daily., Disp: 60  tablet, Rfl: 12 .  latanoprost (XALATAN) 0.005 % ophthalmic solution, Place 0.005 drops into both eyes every evening., Disp: , Rfl:  .  levothyroxine (SYNTHROID, LEVOTHROID) 88 MCG tablet, Take 1 tablet (88 mcg total) by mouth daily., Disp: 90 tablet, Rfl: 3 .  metoprolol succinate (TOPROL-XL) 50 MG 24 hr tablet, Take 1 tablet (50 mg total) by mouth 2 (two) times daily. Take with or immediately following a meal., Disp: 180 tablet, Rfl: 1 .  Multiple Vitamin (MULTIVITAMIN) tablet, Take 1 tablet by mouth daily.  , Disp: , Rfl:  .  nitroGLYCERIN (NITROSTAT) 0.4 MG SL tablet, Place 1 tablet (0.4 mg total) under the tongue every 5 (five) minutes as needed for chest pain., Disp: 25 tablet, Rfl: 1 .  Potassium Chloride ER 20 MEQ TBCR, Take 20 mEq by mouth daily., Disp: 90 tablet, Rfl: 3 .  spironolactone (ALDACTONE) 25 MG tablet, Take 1 tablet (25 mg total) by mouth daily., Disp: 30 tablet, Rfl: 12 .  TOVIAZ 8 MG TB24 tablet, Take 8 mg by mouth daily. , Disp: , Rfl:  .  vitamin E 100 UNIT capsule, Take 100 Units by mouth daily.  , Disp: , Rfl:   Past Medical History: Past Medical History:  Diagnosis Date  . CAD (coronary artery disease)    S/P  anterior  wall myocardial infarction in '92, treated w/percutaneous transluminal coronary angioplasty of the left  anterior descending. EF 45%  . Carotid artery disease ([facility])   . DVT (deep venous thrombosis) ([place])    X1  . History of nuclear stress test    a. Myoview 6/17: EF 20-25%, mid anteroseptal, apical anterior, apical septal, apical inferior, apical lateral and apical scar, no ischemia, intermediate risk  . HTN (hypertension)   . Hyperlipidemia   . Hypothyroidism   . Ischemic cardiomyopathy    a. Echo 6/17: EF 20-25%, apex appears akinetic, MAC, moderate MR, moderate LAE, mild RVE, trivial PI, PASP 47 mmHg (needs repeat with Definity contrast) //  b. Limited echo with Definity contrast 7/17: EF 25-30%, moderate to severe LAE  . MI (myocardial  infarction) ([facility]) 1992   at age 85; TIA treated with coumadin until Plavix initiated in 2006  . PAD (peripheral artery disease) ([facility])    Right SFA occlusion, severe disease left CFA and SFA    Tobacco Use: History  Smoking Status  . Former Smoker  . Quit date: 03/11/1990  Smokeless Tobacco  . Never Used    Comment: smoked 1973- 1992, up to < 5 cigarettes    Labs: Recent Review Flowsheet Data    Labs for ITP Cardiac and Pulmonary Rehab Latest Ref Rng & Units 05/09/2014 05/10/2014 08/21/2015 09/06/2015 09/06/2015   Cholestrol 0 - 200 mg/dL 138 - - - -   LDLCALC 0 - 99 mg/dL 80 - - - -   HDL >39.00 mg/dL 44.30 - - - -   Trlycerides 0.0 - 149.0 mg/dL 71.0 - - - -   Hemoglobin A1c 4.6 - 6.5 % - 5.6 5.6 - -   PHART 7.350 - 7.450 - - - - 7.367   PCO2ART 35.0 - 45.0 mmHg - - - - 37.1   HCO3 20.0 - 24.0 mEq/L - - - 21.8 21.3   TCO2 0 - 100 mmol/L - - - 23 22   ACIDBASEDEF 0.0 - 2.0 mmol/L - - - 4.0(H) 4.0(H)   O2SAT % - - - 57.0 94.0      Capillary Blood Glucose: No results found for: GLUCAP   Exercise Target Goals:    Exercise Program Goal: Individual exercise prescription set with THRR, safety & activity barriers. Participant demonstrates ability to understand and report RPE using BORG scale, to self-measure pulse accurately, and to acknowledge the importance of the exercise prescription.  Exercise Prescription Goal: Starting with aerobic activity 30 plus minutes a day, 3 days per week for initial exercise prescription. Provide home exercise prescription and guidelines that participant acknowledges understanding prior to discharge.  Activity Barriers & Risk Stratification:     Activity Barriers & Cardiac Risk Stratification - 10/04/15 1405      Activity Barriers & Cardiac Risk Stratification   Activity Barriers Balance Concerns;Assistive Device;Other (comment)   Comments hx left ankle fx   Cardiac Risk Stratification High      6 Minute Walk:     6 Minute Walk    Row  Name 10/03/15 1533         6 Minute Walk   Phase Initial     Distance 600 feet     Walk Time 6 minutes     # of Rest Breaks 0     MPH 1.14     METS 0.91     RPE 11     VO2 Peak 3.21     Symptoms No     Resting HR 80 bpm     Resting BP 120/70  Max Ex. HR 98 bpm     Max Ex. BP 108/64     2 Minute Post BP 116/78        Initial Exercise Prescription:     Initial Exercise Prescription - 10/04/15 0900      Date of Initial Exercise RX and Referring Provider   Date 10/03/15   Referring Provider Darlina Guys , MD     Bike   Level 0.5   Minutes 10   METs 1.99     NuStep   Level 1   Minutes 10   METs 1.8     Track   Laps 6   Minutes 10   METs 2.03     Prescription Details   Frequency (times per week) 3   Duration Progress to 30 minutes of continuous aerobic without signs/symptoms of physical distress     Intensity   THRR 40-80% of Max Heartrate 58-116   Ratings of Perceived Exertion 11-13   Perceived Dyspnea 0-4     Progression   Progression Continue to progress workloads to maintain intensity without signs/symptoms of physical distress.     Resistance Training   Training Prescription Yes   Weight 1 lbs   Reps 10-12      Perform Capillary Blood Glucose checks as needed.  Exercise Prescription Changes:      Exercise Prescription Changes    Row Name 10/27/15 0700 11/24/15 0800           Exercise Review   Progression Yes -        Response to Exercise   Blood Pressure (Admit) 118/60 130/70      Blood Pressure (Exercise) 110/68 150/82      Blood Pressure (Exit) 112/70 142/82      Heart Rate (Admit) 81 bpm 76 bpm      Heart Rate (Exercise) 111 bpm 107 bpm      Heart Rate (Exit) 81 bpm 74 bpm      Rating of Perceived Exertion (Exercise) 13 12      Comments Reviewed home exercise guidelines on 10/13/15. Reviewed home exercise guidelines on 10/13/15.      Duration Progress to 30 minutes of continuous aerobic without signs/symptoms of physical  distress Progress to 30 minutes of continuous aerobic without signs/symptoms of physical distress      Intensity THRR unchanged THRR unchanged        Progression   Progression Continue to progress workloads to maintain intensity without signs/symptoms of physical distress. Continue to progress workloads to maintain intensity without signs/symptoms of physical distress.      Average METs 1.9 1.6        Resistance Training   Training Prescription Yes Yes      Weight 3lbs 2lbs      Reps 10-12 10-12        Interval Training   Interval Training No No        Bike   Level -  -      Minutes -  -      METs -  -        NuStep   Level 3 5      Minutes 10 20      METs 1.7 1.6        Arm Ergometer   Level 2 -      Minutes 10 -        Track   Laps 6 5      Minutes 10 10  METs 2.04 1.52        Home Exercise Plan   Plans to continue exercise at [facility] (comment) [facility]      Frequency Add 3 additional days to program exercise sessions. Add 3 additional days to program exercise sessions.         Exercise Comments:      Exercise Comments    Row Name 10/27/15 0744 11/22/15 1500         Exercise Comments Yvonne Bailey is walking 2 days per week at home [facility].  Making good progress with exercise.         Discharge Exercise Prescription (Final Exercise Prescription Changes):     Exercise Prescription Changes - 11/24/15 0800      Exercise Review   Progression --     Response to Exercise   Blood Pressure (Admit) 130/70   Blood Pressure (Exercise) 150/82   Blood Pressure (Exit) 142/82   Heart Rate (Admit) 76 bpm   Heart Rate (Exercise) 107 bpm   Heart Rate (Exit) 74 bpm   Rating of Perceived Exertion (Exercise) 12   Comments Reviewed home exercise guidelines on 10/13/15.   Duration Progress to 30 minutes of continuous aerobic without signs/symptoms of physical distress   Intensity THRR  unchanged     Progression   Progression Continue to progress workloads to maintain intensity without signs/symptoms of physical distress.   Average METs 1.6     Resistance Training   Training Prescription Yes   Weight 2lbs   Reps 10-12     Interval Training   Interval Training No     NuStep   Level 5   Minutes 20   METs 1.6     Arm Ergometer   Level --   Minutes --     Track   Laps 5   Minutes 10   METs 1.52     Home Exercise Plan   Plans to continue exercise at [facility] (comment)   Frequency Add 3 additional days to program exercise sessions.      Nutrition:  Target Goals: Understanding of nutrition guidelines, daily intake of sodium '1500mg'$ , cholesterol '200mg'$ , calories 30% from fat and 7% or less from saturated fats, daily to have 5 or more servings of fruits and vegetables.  Biometrics:     Pre Biometrics - 10/04/15 0944      Pre Biometrics   Height '5\' 7"'$  (1.702 m)   Weight 208 lb 15.9 oz (94.8 kg)   Waist Circumference 35.5 inches   Hip Circumference 48 inches   Waist to Hip Ratio 0.74 %   BMI (Calculated) 32.8   Triceps Skinfold 58 mm   % Body Fat 45.7 %   Grip Strength 35 kg   Flexibility 9.5 in   Single Leg Stand 1.21 seconds       Nutrition Therapy Plan and Nutrition Goals:     Nutrition Therapy & Goals - 10/05/15 1501      Nutrition Therapy   Diet Therapeutic Lifestyle Changes     Personal Nutrition Goals   Personal Goal #1 1-2 lb wt loss/week to a goal wt loss of 6-24 lb at graduation from [facility], educate and counsel regarding individualized specific dietary modifications aiming towards targeted core components such as weight, hypertension, lipid management, diabetes, heart failure and other comorbidities.   Expected Outcomes Short Term Goal:  Understand basic principles of dietary content, such as calories, fat, sodium, cholesterol and nutrients.;Long Term Goal: Adherence  to prescribed nutrition plan.      Nutrition Discharge: Nutrition Scores:     Nutrition Assessments - 10/05/15 1501      MEDFICTS Scores   Pre Score --  pt has not returned nutrition survey at this time      Nutrition Goals Re-Evaluation:   Psychosocial: Target Goals: Acknowledge presence or absence of depression, maximize coping skills, provide positive support system. Participant is able to verbalize types and ability to use techniques and skills needed for reducing stress and depression.  Initial Review & Psychosocial Screening:     Initial Psych Review & Screening - 10/03/15 1639      Family Dynamics   Good Support System? Yes     Barriers   Psychosocial barriers to participate in program The patient should benefit from training in stress management and relaxation.     Screening Interventions   Interventions Encouraged to exercise      Quality of Life Scores:     Quality of Life - 10/10/15 0750      Quality of Life Scores   Health/Function Pre 28 %   Socioeconomic Pre 29.29 %   Psych/Spiritual Pre 28.29 %   Family Pre 30 %   GLOBAL Pre 28.59 %      PHQ-9: Recent Review Flowsheet Data    Depression screen [facility] 2/9 11/02/2015 10/09/2015 12/15/2014 05/14/2013   Decreased Interest 0 0 0 0   Down, Depressed, Hopeless 0 0 0 0   PHQ - 2 Score 0 0 0 0      Psychosocial Evaluation and Intervention:   Psychosocial Re-Evaluation:     Psychosocial Re-Evaluation    Row Name 10/25/15 1716 11/22/15 1809           Psychosocial Re-Evaluation   Interventions Encouraged to attend Cardiac Rehabilitation for the exercise Encouraged to attend Cardiac Rehabilitation for the exercise      Continued Psychosocial Services Needed No No         Vocational Rehabilitation: Provide vocational rehab assistance to qualifying candidates.   Vocational Rehab Evaluation & Intervention:     Vocational Rehab - 10/03/15 1638      Initial Vocational Rehab Evaluation &  Intervention   Assessment shows need for Vocational Rehabilitation No      Education: Education Goals: Education classes will be provided on a weekly basis, covering required topics. Participant will state understanding/return demonstration of topics presented.  Learning Barriers/Preferences:     Learning Barriers/Preferences - 10/04/15 1406      Learning Barriers/Preferences   Learning Barriers Hearing;Sight   Learning Preferences Written Material;Pictoral;Video      Education Topics: Count Your Pulse:  -Group instruction provided by verbal instruction, demonstration, patient participation and written materials to support subject.  Instructors address importance of being able to find your pulse and how to count your pulse when at home without a heart monitor.  Patients get hands on experience counting their pulse with staff help and individually. Flowsheet Row CARDIAC REHAB PHASE II EXERCISE from 11/10/2015 in [facility]  Date  11/10/15  Educator  Barnet Pall, RN  Instruction Review Code  2- meets goals/outcomes      Heart Attack, Angina, and Risk Factor Modification:  -Group instruction provided by verbal instruction, video, and written materials to support subject.  Instructors address signs and symptoms of angina and heart attacks.  Also discuss risk factors for heart disease and how to make changes to improve heart health risk factors. Flowsheet Row CARDIAC REHAB PHASE II EXERCISE from 11/10/2015 in [facility]  Date  11/01/15  Educator  RN  Instruction Review Code  2- meets goals/outcomes      Functional Fitness:  -Group instruction provided by verbal instruction, demonstration, patient participation, and written materials to support subject.  Instructors address safety measures for doing things around the house.  Discuss how to get up and down off the floor, how to pick things up properly, how to safely get  out of a chair without assistance, and balance training. Flowsheet Row CARDIAC REHAB PHASE II EXERCISE from 11/10/2015 in [place]  Date  10/27/15  Educator  EP  Instruction Review Code  2- meets goals/outcomes      Meditation and Mindfulness:  -Group instruction provided by verbal instruction, patient participation, and written materials to support subject.  Instructor addresses importance of mindfulness and meditation practice to help reduce stress and improve awareness.  Instructor also leads participants through a meditation exercise.    Stretching for Flexibility and Mobility:  -Group instruction provided by verbal instruction, patient participation, and written materials to support subject.  Instructors lead participants through series of stretches that are designed to increase flexibility thus improving mobility.  These stretches are additional exercise for major muscle groups that are typically performed during regular warm up and cool down. Flowsheet Row CARDIAC REHAB PHASE II EXERCISE from 11/10/2015 in [place]  Date  11/03/15  Instruction Review Code  2- meets goals/outcomes      Hands Only CPR Anytime:  -Group instruction provided by verbal instruction, video, patient participation and written materials to support subject.  Instructors co-teach with [facility] video for hands only CPR.  Participants get hands on experience with mannequins.   Nutrition I class: Heart Healthy Eating:  -Group instruction provided by PowerPoint slides, verbal discussion, and written materials to support subject matter. The instructor gives an explanation and review of the Therapeutic Lifestyle Changes diet recommendations, which includes a discussion on lipid goals, dietary fat, sodium, fiber, plant stanol/sterol esters, sugar, and the components of a well-balanced, healthy diet.   Nutrition II class: Lifestyle Skills:  -Group instruction  provided by PowerPoint slides, verbal discussion, and written materials to support subject matter. The instructor gives an explanation and review of label reading, grocery shopping for heart health, heart healthy recipe modifications, and ways to make healthier choices when eating out.   Diabetes Question & Answer:  -Group instruction provided by PowerPoint slides, verbal discussion, and written materials to support subject matter. The instructor gives an explanation and review of diabetes co-morbidities, pre- and post-prandial blood glucose goals, pre-exercise blood glucose goals, signs, symptoms, and treatment of hypoglycemia and hyperglycemia, and foot care basics. Flowsheet Row CARDIAC REHAB PHASE II EXERCISE from 11/10/2015 in [place]  Date  10/20/15  Educator  RD  Instruction Review Code  2- meets goals/outcomes      Diabetes Blitz:  -Group instruction provided by PowerPoint slides, verbal discussion, and written materials to support subject matter. The instructor gives an explanation and review of the physiology behind type 1 and type 2 diabetes, diabetes medications and rational behind using different medications, pre- and post-prandial blood glucose recommendations and Hemoglobin A1c goals, diabetes diet, and exercise including blood glucose guidelines for exercising safely.    Portion Distortion:  -  Group instruction provided by PowerPoint slides, verbal discussion, written materials, and food models to support subject matter. The instructor gives an explanation of serving size versus portion size, changes in portions sizes over the last 20 years, and what consists of a serving from each food group.   Stress Management:  -Group instruction provided by verbal instruction, video, and written materials to support subject matter.  Instructors review role of stress in heart disease and how to cope with stress positively.   Flowsheet Row CARDIAC REHAB PHASE  II EXERCISE from 11/10/2015 in [place]  Date  10/11/15  Educator  Andi Hence, RN  Instruction Review Code  2- meets goals/outcomes      Exercising on Your Own:  -Group instruction provided by verbal instruction, power point, and written materials to support subject.  Instructors discuss benefits of exercise, components of exercise, frequency and intensity of exercise, and end points for exercise.  Also discuss use of nitroglycerin and activating EMS.  Review options of places to exercise outside of rehab.  Review guidelines for sex with heart disease.   Cardiac Drugs I:  -Group instruction provided by verbal instruction and written materials to support subject.  Instructor reviews cardiac drug classes: antiplatelets, anticoagulants, beta blockers, and statins.  Instructor discusses reasons, side effects, and lifestyle considerations for each drug class.   Cardiac Drugs II:  -Group instruction provided by verbal instruction and written materials to support subject.  Instructor reviews cardiac drug classes: angiotensin converting enzyme inhibitors (ACE-I), angiotensin II receptor blockers (ARBs), nitrates, and calcium channel blockers.  Instructor discusses reasons, side effects, and lifestyle considerations for each drug class.   Anatomy and Physiology of the Circulatory System:  -Group instruction provided by verbal instruction, video, and written materials to support subject.  Reviews functional anatomy of heart, how it relates to various diagnoses, and what role the heart plays in the overall system.   Knowledge Questionnaire Score:     Knowledge Questionnaire Score - 10/10/15 0750      Knowledge Questionnaire Score   Pre Score 21/24      Core Components/Risk Factors/Patient Goals at Admission:     Personal Goals and Risk Factors at Admission - 10/03/15 1623      Core Components/Risk Factors/Patient Goals on Admission    Weight Management  Weight Loss   Intervention Weight Management: Develop a combined nutrition and exercise program designed to reach desired caloric intake, while maintaining appropriate intake of nutrient and fiber, sodium and fats, and appropriate energy expenditure required for the weight goal.;Weight Management/Obesity: Establish reasonable short term and long term weight goals.;Weight Management: Provide education and appropriate resources to help participant work on and attain dietary goals.   Admit Weight 207 lb 3.7 oz (94 kg)   Expected Outcomes Weight Loss: Understanding of general recommendations for a balanced deficit meal plan, which promotes 1-2 lb weight loss per week and includes a negative energy balance of [phone] kcal/d;Understanding of distribution of calorie intake throughout the day with the consumption of 4-5 meals/snacks   Personal Goal Other Yes   Personal Goal SHORT TERM:  weight loss; LONG TERM:  get back to exercise in [facility] program.    Intervention develop invidualized exercise prescription fo aerobic and resistive training baased on initial evaluation findings, risk stratification, and comorbidities, provide nutritional education and caloric needs planning.    Expected Outcomes achievement of increased cardiorespiratory fitness and enhanced flexability, muscular endurance and strength shown through measurements of functional capacity and  personal statement of participant.       Core Components/Risk Factors/Patient Goals Review:      Goals and Risk Factor Review    Row Name 10/27/15 0742 11/24/15 0857           Core Components/Risk Factors/Patient Goals Review   Personal Goals Review Weight Management/Obesity;Other Other      Review Patient is making excellent progress with exercise. She is walking 2 days per week at home [facility] has [facility] 4.2 lbs since admission. Patient is doing well with exercise. [facility] in addition to cardiac rehab. Pt has gained weight.      Expected Outcomes Achieve weight loss and fitness goals through regular exercise program at least 30 minutes 5-7 days per week. Continue to progress workloads to help achieve fitness and weight loss goals.         Core Components/Risk Factors/Patient Goals at Discharge (Final Review):      Goals and Risk Factor Review - 11/24/15 0857      Core Components/Risk Factors/Patient Goals Review   Personal Goals Review Other   Review Patient is doing well with exercise. [facility] to cardiac rehab. Pt has gained weight.   Expected Outcomes Continue to progress workloads to help achieve fitness and weight loss goals.      ITP Comments:     ITP Comments    Row Name 10/03/15 1438 10/13/15 1540         ITP Comments Medical Director- Fransico Him, MD attended Hypertension video/lecture education offering/met outcome, goals         Comments: Yvonne Bailey is making expected progress toward personal goals after completing 16 sessions. Recommend continued exercise and life style modification education including  stress management and relaxation techniques to decrease cardiac risk profile. [name] is enjoying participating in cardiac rehab.Barnet Pall, RN,BSN 11/24/2015 12:33 PM

## 2015-11-24 ENCOUNTER — Encounter (HOSPITAL_COMMUNITY)
Admission: RE | Admit: 2015-11-24 | Discharge: 2015-11-24 | Disposition: A | Discharge status: Home / Self Care | Payer: Commercial Managed Care - HMO | Source: Ambulatory Visit | Attending: Cardiovascular Disease | Admitting: Cardiovascular Disease

## 2015-11-24 DIAGNOSIS — Z955 Presence of coronary angioplasty implant and graft: Secondary | ICD-10-CM

## 2015-11-27 ENCOUNTER — Encounter (HOSPITAL_COMMUNITY)
Admission: RE | Admit: 2015-11-27 | Discharge: 2015-11-27 | Disposition: A | Discharge status: Home / Self Care | Payer: Commercial Managed Care - HMO | Source: Ambulatory Visit | Attending: Cardiovascular Disease | Admitting: Cardiovascular Disease

## 2015-11-27 DIAGNOSIS — Z955 Presence of coronary angioplasty implant and graft: Secondary | ICD-10-CM

## 2015-11-27 NOTE — Progress Notes (Signed)
Yvonne Bailey 75 y.o. female Nutrition Note Spoke with pt. Nutrition Plan and Nutrition Survey goals reviewed with pt. Pt is following Step 1 of the Therapeutic Lifestyle Changes diet. Pt wants to lose wt. Pt is not actively trying to lose wt at this time.  Pt with dx of CHF. Per discussion, pt does not use canned/convenience foods often. Pt chooses mostly frozen vegetables and seasons food with low-sodium seasonings. Pt expressed understanding of the information reviewed. Pt aware of nutrition education classes offered.  Lab Results  Component Value Date   HGBA1C 5.6 08/21/2015   Wt Readings from Last 3 Encounters:  11/02/15 211 lb (95.7 kg)  10/04/15 208 lb 15.9 oz (94.8 kg)  09/15/15 206 lb 6.4 oz (93.6 kg)    Nutrition Diagnosis ? Food-and nutrition-related knowledge deficit related to lack of exposure to information as related to diagnosis of: ? CVD ? 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 Pt's individual nutrition plan reviewed with pt. ? Benefits of adopting Therapeutic Lifestyle Changes discussed when [employer] reviewed. ? Pt to attend the Portion Distortion class ? Pt to attend the   ? Nutrition I class                     ? Nutrition II class  ? Pt given handouts for: ? Nutrition I class ? Nutrition II class ? Continue client-centered nutrition education by RD, as part of interdisciplinary care. Goal(s) ? Pt to identify and limit food sources of saturated fat, trans fat, and sodium ? Pt to identify food quantities necessary to achieve weight loss of 6-24 lb (2.7-10.9 kg) at graduation from cardiac rehab.  Monitor and Evaluate progress toward nutrition goal with team. Mickle Plumb, M.Ed, RD, LDN, CDE 11/27/2015 3:47 PM

## 2015-11-29 ENCOUNTER — Encounter (HOSPITAL_COMMUNITY)
Admission: RE | Admit: 2015-11-29 | Discharge: 2015-11-29 | Disposition: A | Discharge status: Home / Self Care | Payer: Commercial Managed Care - HMO | Source: Ambulatory Visit | Attending: Cardiovascular Disease | Admitting: Cardiovascular Disease

## 2015-11-29 DIAGNOSIS — Z955 Presence of coronary angioplasty implant and graft: Secondary | ICD-10-CM | POA: Diagnosis not present

## 2015-12-01 ENCOUNTER — Encounter (HOSPITAL_COMMUNITY)
Admission: RE | Admit: 2015-12-01 | Discharge: 2015-12-01 | Disposition: A | Discharge status: Home / Self Care | Payer: Commercial Managed Care - HMO | Source: Ambulatory Visit | Attending: Cardiovascular Disease | Admitting: Cardiovascular Disease

## 2015-12-01 DIAGNOSIS — Z955 Presence of coronary angioplasty implant and graft: Secondary | ICD-10-CM

## 2015-12-04 ENCOUNTER — Encounter (HOSPITAL_COMMUNITY)
Admission: RE | Admit: 2015-12-04 | Discharge: 2015-12-04 | Disposition: A | Discharge status: Home / Self Care | Payer: Commercial Managed Care - HMO | Source: Ambulatory Visit | Attending: Cardiovascular Disease | Admitting: Cardiovascular Disease

## 2015-12-04 DIAGNOSIS — Z955 Presence of coronary angioplasty implant and graft: Secondary | ICD-10-CM | POA: Diagnosis not present

## 2015-12-06 ENCOUNTER — Encounter (HOSPITAL_COMMUNITY): Payer: Commercial Managed Care - HMO

## 2015-12-08 ENCOUNTER — Encounter (HOSPITAL_COMMUNITY): Payer: Commercial Managed Care - HMO

## 2015-12-11 ENCOUNTER — Encounter (HOSPITAL_COMMUNITY)
Admission: RE | Admit: 2015-12-11 | Discharge: 2015-12-11 | Disposition: A | Discharge status: Home / Self Care | Payer: Commercial Managed Care - HMO | Source: Ambulatory Visit | Attending: Cardiovascular Disease | Admitting: Cardiovascular Disease

## 2015-12-11 DIAGNOSIS — Z955 Presence of coronary angioplasty implant and graft: Secondary | ICD-10-CM | POA: Insufficient documentation

## 2015-12-12 ENCOUNTER — Other Ambulatory Visit (HOSPITAL_COMMUNITY): Payer: Commercial Managed Care - HMO

## 2015-12-13 ENCOUNTER — Encounter (HOSPITAL_COMMUNITY): Payer: Commercial Managed Care - HMO

## 2015-12-15 ENCOUNTER — Encounter (HOSPITAL_COMMUNITY)
Admission: RE | Admit: 2015-12-15 | Discharge: 2015-12-15 | Disposition: A | Discharge status: Home / Self Care | Payer: Commercial Managed Care - HMO | Source: Ambulatory Visit | Attending: Cardiovascular Disease | Admitting: Cardiovascular Disease

## 2015-12-15 DIAGNOSIS — Z955 Presence of coronary angioplasty implant and graft: Secondary | ICD-10-CM

## 2015-12-18 ENCOUNTER — Encounter (HOSPITAL_COMMUNITY): Payer: Commercial Managed Care - HMO

## 2015-12-20 ENCOUNTER — Ambulatory Visit: Payer: Commercial Managed Care - HMO | Admitting: Internal Medicine

## 2015-12-20 ENCOUNTER — Encounter (HOSPITAL_COMMUNITY)
Admission: RE | Admit: 2015-12-20 | Discharge: 2015-12-20 | Disposition: A | Discharge status: Home / Self Care | Payer: Commercial Managed Care - HMO | Source: Ambulatory Visit | Attending: Cardiovascular Disease | Admitting: Cardiovascular Disease

## 2015-12-20 DIAGNOSIS — Z955 Presence of coronary angioplasty implant and graft: Secondary | ICD-10-CM

## 2015-12-21 NOTE — Progress Notes (Signed)
Cardiac Individual Treatment Plan  Patient Details  Name: Yvonne Bailey MRN: [id number] Date of Birth: August 22, 1940 Referring Provider:   Flowsheet Row CARDIAC REHAB PHASE II ORIENTATION from 10/03/2015 in [facility]  Referring Provider  Darlina Guys , MD      Initial Encounter Date:  Flowsheet Row CARDIAC REHAB PHASE II ORIENTATION from 10/03/2015 in [facility]  Date  10/03/15  Referring Provider  Darlina Guys , MD      Visit Diagnosis: Status post coronary artery stent placement  Patient's Home Medications on Admission:  Current Outpatient Prescriptions:  .  albuterol (VENTOLIN HFA) 108 (90 Base) MCG/ACT inhaler, Inhale 1-2 puffs into the lungs every 6 (six) hours as needed for wheezing or shortness of breath. (Patient not taking: Reported on 11/02/2015), Disp: 18 g, Rfl: 1 .  apixaban (ELIQUIS) 5 MG TABS tablet, Take 1 tablet (5 mg total) by mouth 2 (two) times daily., Disp: 180 tablet, Rfl: 2 .  Ascorbic Acid (VITAMIN C) 100 MG tablet, Take 100 mg by mouth daily.  , Disp: , Rfl:  .  aspirin EC 81 MG EC tablet, Take 1 tablet (81 mg total) by mouth daily. (Patient not taking: Reported on 11/02/2015), Disp: , Rfl:  .  atorvastatin (LIPITOR) 40 MG tablet, Take 1 tablet (40 mg total) by mouth daily., Disp: 90 tablet, Rfl: 3 .  benazepril (LOTENSIN) 20 MG tablet, TAKE 1 TABLET EVERY DAY, Disp: 90 tablet, Rfl: 2 .  brimonidine (ALPHAGAN) 0.2 % ophthalmic solution, PLACE ONE DROPTO THE LEFT EYE TWICE  DAILY, Disp: , Rfl: 0 .  cholecalciferol (VITAMIN D) 1000 UNITS tablet, Take 1,000 Units by mouth daily., Disp: , Rfl:  .  clopidogrel (PLAVIX) 75 MG tablet, Take 1 tablet (75 mg total) by mouth daily., Disp: 30 tablet, Rfl: 12 .  fish oil-omega-3 fatty acids 1000 MG capsule, Take 1,000 mg by mouth daily. , Disp: , Rfl:  .  furosemide (LASIX) 40 MG tablet, Take 1 tablet (40 mg total) by mouth 2 (two) times daily., Disp: 60  tablet, Rfl: 12 .  latanoprost (XALATAN) 0.005 % ophthalmic solution, Place 0.005 drops into both eyes every evening., Disp: , Rfl:  .  levothyroxine (SYNTHROID, LEVOTHROID) 88 MCG tablet, Take 1 tablet (88 mcg total) by mouth daily., Disp: 90 tablet, Rfl: 3 .  metoprolol succinate (TOPROL-XL) 50 MG 24 hr tablet, Take 1 tablet (50 mg total) by mouth 2 (two) times daily. Take with or immediately following a meal., Disp: 180 tablet, Rfl: 1 .  Multiple Vitamin (MULTIVITAMIN) tablet, Take 1 tablet by mouth daily.  , Disp: , Rfl:  .  nitroGLYCERIN (NITROSTAT) 0.4 MG SL tablet, Place 1 tablet (0.4 mg total) under the tongue every 5 (five) minutes as needed for chest pain., Disp: 25 tablet, Rfl: 1 .  Potassium Chloride ER 20 MEQ TBCR, Take 20 mEq by mouth daily., Disp: 90 tablet, Rfl: 3 .  spironolactone (ALDACTONE) 25 MG tablet, Take 1 tablet (25 mg total) by mouth daily., Disp: 30 tablet, Rfl: 12 .  TOVIAZ 8 MG TB24 tablet, Take 8 mg by mouth daily. , Disp: , Rfl:  .  vitamin E 100 UNIT capsule, Take 100 Units by mouth daily.  , Disp: , Rfl:   Past Medical History: Past Medical History:  Diagnosis Date  . CAD (coronary artery disease)    S/P  anterior  wall myocardial infarction in '92, treated w/percutaneous transluminal coronary angioplasty of the left  anterior descending. EF 45%  . Carotid artery disease ([place])   . DVT (deep venous thrombosis) ([place])    X1  . History of nuclear stress test    a. Myoview 6/17: EF 20-25%, mid anteroseptal, apical anterior, apical septal, apical inferior, apical lateral and apical scar, no ischemia, intermediate risk  . HTN (hypertension)   . Hyperlipidemia   . Hypothyroidism   . Ischemic cardiomyopathy    a. Echo 6/17: EF 20-25%, apex appears akinetic, MAC, moderate MR, moderate LAE, mild RVE, trivial PI, PASP 47 mmHg (needs repeat with Definity contrast) //  b. Limited echo with Definity contrast 7/17: EF 25-30%, moderate to severe LAE  . MI (myocardial  infarction) 1992   at age 14; TIA treated with coumadin until Plavix initiated in 2006  . PAD (peripheral artery disease) ([facility])    Right SFA occlusion, severe disease left CFA and SFA    Tobacco Use: History  Smoking Status  . Former Smoker  . Quit date: 03/11/1990  Smokeless Tobacco  . Never Used    Comment: smoked 1973- 1992, up to < 5 cigarettes    Labs: Recent Review Flowsheet Data    Labs for ITP Cardiac and Pulmonary Rehab Latest Ref Rng & Units 05/09/2014 05/10/2014 08/21/2015 09/06/2015 09/06/2015   Cholestrol 0 - 200 mg/dL 138 - - - -   LDLCALC 0 - 99 mg/dL 80 - - - -   HDL >39.00 mg/dL 44.30 - - - -   Trlycerides 0.0 - 149.0 mg/dL 71.0 - - - -   Hemoglobin A1c 4.6 - 6.5 % - 5.6 5.6 - -   PHART 7.350 - 7.450 - - - - 7.367   PCO2ART 35.0 - 45.0 mmHg - - - - 37.1   HCO3 20.0 - 24.0 mEq/L - - - 21.8 21.3   TCO2 0 - 100 mmol/L - - - 23 22   ACIDBASEDEF 0.0 - 2.0 mmol/L - - - 4.0(H) 4.0(H)   O2SAT % - - - 57.0 94.0      Capillary Blood Glucose: No results found for: GLUCAP   Exercise Target Goals:    Exercise Program Goal: Individual exercise prescription set with THRR, safety & activity barriers. Participant demonstrates ability to understand and report RPE using BORG scale, to self-measure pulse accurately, and to acknowledge the importance of the exercise prescription.  Exercise Prescription Goal: Starting with aerobic activity 30 plus minutes a day, 3 days per week for initial exercise prescription. Provide home exercise prescription and guidelines that participant acknowledges understanding prior to discharge.  Activity Barriers & Risk Stratification:     Activity Barriers & Cardiac Risk Stratification - 10/04/15 1405      Activity Barriers & Cardiac Risk Stratification   Activity Barriers Balance Concerns;Assistive Device;Other (comment)   Comments hx left ankle fx   Cardiac Risk Stratification High      6 Minute Walk:     6 Minute Walk    Row Name  10/03/15 1533         6 Minute Walk   Phase Initial     Distance 600 feet     Walk Time 6 minutes     # of Rest Breaks 0     MPH 1.14     METS 0.91     RPE 11     VO2 Peak 3.21     Symptoms No     Resting HR 80 bpm     Resting BP 120/70  Max Ex. HR 98 bpm     Max Ex. BP 108/64     2 Minute Post BP 116/78        Initial Exercise Prescription:     Initial Exercise Prescription - 10/04/15 0900      Date of Initial Exercise RX and Referring Provider   Date 10/03/15   Referring Provider Darlina Guys , MD     Bike   Level 0.5   Minutes 10   METs 1.99     NuStep   Level 1   Minutes 10   METs 1.8     Track   Laps 6   Minutes 10   METs 2.03     Prescription Details   Frequency (times per week) 3   Duration Progress to 30 minutes of continuous aerobic without signs/symptoms of physical distress     Intensity   THRR 40-80% of Max Heartrate 58-116   Ratings of Perceived Exertion 11-13   Perceived Dyspnea 0-4     Progression   Progression Continue to progress workloads to maintain intensity without signs/symptoms of physical distress.     Resistance Training   Training Prescription Yes   Weight 1 lbs   Reps 10-12      Perform Capillary Blood Glucose checks as needed.  Exercise Prescription Changes:      Exercise Prescription Changes    Row Name 10/27/15 0700 11/24/15 0800 12/20/15 0800         Exercise Review   Progression Yes -  -       Response to Exercise   Blood Pressure (Admit) 118/60 130/70 122/72     Blood Pressure (Exercise) 110/68 150/82 120/70     Blood Pressure (Exit) 112/70 142/82 120/80     Heart Rate (Admit) 81 bpm 76 bpm 80 bpm     Heart Rate (Exercise) 111 bpm 107 bpm 92 bpm     Heart Rate (Exit) 81 bpm 74 bpm 77 bpm     Rating of Perceived Exertion (Exercise) '13 12 12     '$ Comments Reviewed home exercise guidelines on 10/13/15. Reviewed home exercise guidelines on 10/13/15. Reviewed home exercise guidelines on 10/13/15.      Duration Progress to 30 minutes of continuous aerobic without signs/symptoms of physical distress Progress to 30 minutes of continuous aerobic without signs/symptoms of physical distress Progress to 30 minutes of continuous aerobic without signs/symptoms of physical distress     Intensity THRR unchanged THRR unchanged THRR unchanged       Progression   Progression Continue to progress workloads to maintain intensity without signs/symptoms of physical distress. Continue to progress workloads to maintain intensity without signs/symptoms of physical distress. Continue to progress workloads to maintain intensity without signs/symptoms of physical distress.     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 Add 3 additional days to program exercise sessions. Add 3 additional days to program exercise sessions.        Exercise Comments:      Exercise Comments    Row Name 10/27/15 0744 11/22/15 1500 12/20/15 0810       Exercise Comments Tejal is walking 2 days per week at home [facility].  Making good progress with exercise. Making slow progress with exercise. Increasing workloads as tolerated.        Discharge Exercise Prescription (Final Exercise Prescription Changes):     Exercise Prescription Changes - 12/20/15 0800       Response to Exercise   Blood Pressure (Admit) 122/72   Blood Pressure (Exercise) 120/70   Blood Pressure (Exit) 120/80   Heart Rate (Admit) 80 bpm   Heart Rate (Exercise) 92 bpm   Heart Rate (Exit) 77 bpm   Rating of Perceived Exertion (Exercise) 12   Comments Reviewed home exercise guidelines on 10/13/15.   Duration Progress to 30 minutes of continuous aerobic without signs/symptoms of physical distress   Intensity THRR unchanged     Progression   Progression Continue to progress workloads to maintain intensity without signs/symptoms of physical distress.   Average METs 1.6     Resistance Training   Training Prescription Yes   Weight 2lbs   Reps 10-12     Interval Training   Interval Training No     NuStep   Level 5   Minutes 20   METs 1.7     Track   Laps 3   Minutes 10   METs 1.52     Home Exercise Plan   Plans to continue exercise at [facility] (comment)   Frequency Add 3 additional days to program exercise sessions.      Nutrition:  Target Goals: Understanding of nutrition guidelines, daily intake of sodium '1500mg'$ , cholesterol '200mg'$ , calories 30% from fat and 7% or less from saturated fats, daily to have 5 or more servings of fruits and vegetables.  Biometrics:     Pre Biometrics - 10/04/15 0944      Pre Biometrics   Height '5\' 7"'$  (1.702 m)   Weight 208 lb 15.9 oz (94.8 kg)   Waist Circumference 35.5 inches   Hip Circumference 48 inches   Waist to Hip Ratio 0.74 %   BMI (Calculated) 32.8   Triceps Skinfold 58 mm   % Body Fat 45.7 %   Grip Strength 35 kg   Flexibility 9.5 in   Single Leg Stand 1.21 seconds       Nutrition Therapy Plan and Nutrition Goals:     Nutrition Therapy & Goals - 10/05/15 1501      Nutrition Therapy   Diet Therapeutic Lifestyle Changes     Personal Nutrition Goals   Personal Goal #1 1-2 lb wt loss/week to a goal wt loss of 6-24 lb at graduation from [facility]  Prescribe, educate and counsel regarding individualized specific dietary modifications aiming towards targeted core components such as weight, hypertension, lipid management, diabetes, heart failure and other comorbidities.   Expected Outcomes Short Term Goal: Understand basic principles of dietary content, such as calories, fat, sodium, cholesterol and nutrients.;Long Term Goal: Adherence to prescribed nutrition plan.      Nutrition Discharge: Nutrition Scores:     Nutrition Assessments - 11/27/15 1547      MEDFICTS Scores   Pre Score 54      Nutrition Goals Re-Evaluation:   Psychosocial: Target Goals: Acknowledge presence or absence of depression, maximize coping skills, provide positive support system. Participant is able to verbalize types and ability to use techniques and skills needed for reducing stress and depression.  Initial Review & Psychosocial Screening:     Initial Psych Review & Screening - 10/03/15 1639      Family Dynamics   Good Support System? Yes     Barriers   Psychosocial barriers to participate in program The patient should benefit from training in stress management and relaxation.     Screening Interventions   Interventions Encouraged to exercise      Quality of Life Scores:     Quality of Life - 10/10/15 0750      Quality of Life Scores   Health/Function Pre 28 %   Socioeconomic Pre 29.29 %   Psych/Spiritual Pre 28.29 %   Family Pre 30 %   GLOBAL Pre 28.59 %      PHQ-9: Recent Review Flowsheet Data    Depression screen [facility] 2/9 11/02/2015 10/09/2015 12/15/2014 05/14/2013   Decreased Interest 0 0 0 0   Down, Depressed, Hopeless 0 0 0 0   PHQ - 2 Score 0 0 0 0      Psychosocial Evaluation and Intervention:   Psychosocial Re-Evaluation:     Psychosocial Re-Evaluation    Row Name 10/25/15 1716 11/22/15 1809 12/21/15 1359         Psychosocial Re-Evaluation   Interventions Encouraged to attend Cardiac Rehabilitation for the exercise  Encouraged to attend Cardiac Rehabilitation for the exercise Encouraged to attend Cardiac Rehabilitation for the exercise     Continued Psychosocial Services Needed No No No        Vocational Rehabilitation: Provide vocational rehab assistance to qualifying candidates.   Vocational Rehab Evaluation & Intervention:     Vocational Rehab - 10/03/15 1638      Initial Vocational Rehab Evaluation & Intervention   Assessment shows need for Vocational Rehabilitation No      Education: Education Goals: Education classes will be provided on a weekly basis, covering required topics. Participant will state understanding/return demonstration of topics presented.  Learning Barriers/Preferences:     Learning Barriers/Preferences - 10/04/15 1406      Learning Barriers/Preferences   Learning Barriers Hearing;Sight   Learning Preferences Written Material;Pictoral;Video      Education Topics: Count Your Pulse:  -Group instruction provided by verbal instruction, demonstration, patient participation and written materials to support subject.  Instructors address importance of being able to find your pulse and how to count your pulse when at home without a heart monitor.  Patients get hands on experience counting their pulse with staff help and individually. Flowsheet Row CARDIAC REHAB PHASE II EXERCISE from 12/20/2015 in [facility]  Date  11/10/15  Educator  Barnet Pall, RN  Instruction Review Code  2- meets goals/outcomes      Heart Attack, Angina, and Risk Factor  Modification:  -Group instruction provided by verbal instruction, video, and written materials to support subject.  Instructors address signs and symptoms of angina and heart attacks.    Also discuss risk factors for heart disease and how to make changes to improve heart health risk factors. Flowsheet Row CARDIAC REHAB PHASE II EXERCISE from 12/20/2015 in [facility]   Date  11/01/15  Educator  RN  Instruction Review Code  2- meets goals/outcomes      Functional Fitness:  -Group instruction provided by verbal instruction, demonstration, patient participation, and written materials to support subject.  Instructors address safety measures for doing things around the house.  Discuss how to get up and down off the floor, how to pick things up properly, how to safely get out of a chair without assistance, and balance training. Flowsheet Row CARDIAC REHAB PHASE II EXERCISE from 12/20/2015 in [place]  Date  10/27/15  Educator  EP  Instruction Review Code  2- meets goals/outcomes      Meditation and Mindfulness:  -Group instruction provided by verbal instruction, patient participation, and written materials to support subject.  Instructor addresses importance of mindfulness and meditation practice to help reduce stress and improve awareness.  Instructor also leads participants through a meditation exercise.    Stretching for Flexibility and Mobility:  -Group instruction provided by verbal instruction, patient participation, and written materials to support subject.  Instructors lead participants through series of stretches that are designed to increase flexibility thus improving mobility.  These stretches are additional exercise for major muscle groups that are typically performed during regular warm up and cool down. Flowsheet Row CARDIAC REHAB PHASE II EXERCISE from 12/20/2015 in [facility]  Date  11/03/15  Instruction Review Code  2- meets goals/outcomes      Hands Only CPR Anytime:  -Group instruction provided by verbal instruction, video, patient participation and written materials to support subject.  Instructors co-teach with AHA video for hands only CPR.  Participants get hands on experience with mannequins.   Nutrition I class: Heart Healthy Eating:  -Group instruction provided by  PowerPoint slides, verbal discussion, and written materials to support subject matter. The instructor gives an explanation and review of the Therapeutic Lifestyle Changes diet recommendations, which includes a discussion on lipid goals, dietary fat, sodium, fiber, plant stanol/sterol esters, sugar, and the components of a well-balanced, healthy diet. Flowsheet Row CARDIAC REHAB PHASE II EXERCISE from 12/20/2015 in [facility] [facility]  Date  11/27/15  Educator  RD  Instruction Review Code  Not applicable [class handouts given]      Nutrition II class: Lifestyle Skills:  -Group instruction provided by PowerPoint slides, verbal discussion, and written materials to support subject matter. The instructor gives an explanation and review of label reading, grocery shopping for heart health, heart healthy recipe modifications, and ways to make healthier choices when eating out. Flowsheet Row CARDIAC REHAB PHASE II EXERCISE from 12/20/2015 in [place]  Date  11/27/15  Educator  RD  Instruction Review Code  Not applicable [class handouts given]      Diabetes Question & Answer:  -Group instruction provided by PowerPoint slides, verbal discussion, and written materials to support subject matter. The instructor gives an explanation and review of diabetes co-morbidities, pre- and post-prandial blood glucose goals, pre-exercise blood glucose goals, signs, symptoms, and treatment of hypoglycemia and hyperglycemia, and foot care basics. [place]  REHAB PHASE II EXERCISE from 12/20/2015 in [place]  Date  10/20/15  Educator  RD  Instruction Review Code  2- meets goals/outcomes      Diabetes Blitz:  -Group instruction provided by PowerPoint slides, verbal discussion, and written materials to support subject matter. The instructor gives an explanation and review of the physiology behind type 1 and type 2  diabetes, diabetes medications and rational behind using different medications, pre- and post-prandial blood glucose recommendations and Hemoglobin A1c goals, diabetes diet, and exercise including blood glucose guidelines for exercising safely.    Portion Distortion:  -Group instruction provided by PowerPoint slides, verbal discussion, written materials, and food models to support subject matter. The instructor gives an explanation of serving size versus portion size, changes in portions sizes over the last 20 years, and what consists of a serving from each food group.   Stress Management:  -Group instruction provided by verbal instruction, video, and written materials to support subject matter.  Instructors review role of stress in heart disease and how to cope with stress positively.   Flowsheet Row CARDIAC REHAB PHASE II EXERCISE from 12/20/2015 in [facility]  Date  10/11/15  Educator  Andi Hence, RN  Instruction Review Code  2- meets goals/outcomes      Exercising on Your Own:  -Group instruction provided by verbal instruction, power point, and written materials to support subject.  Instructors discuss benefits of exercise, components of exercise, frequency and intensity of exercise, and end points for exercise.  Also discuss use of nitroglycerin and activating EMS.  Review options of places to exercise outside of rehab.  Review guidelines for sex with heart disease. Flowsheet Row CARDIAC REHAB PHASE II EXERCISE from 12/20/2015 in [place]  Date  11/29/15  Instruction Review Code  2- meets goals/outcomes      Cardiac Drugs I:  -Group instruction provided by verbal instruction and written materials to support subject.  Instructor reviews cardiac drug classes: antiplatelets, anticoagulants, beta blockers, and statins.  Instructor discusses reasons, side effects, and lifestyle considerations for each drug class. Flowsheet  Row CARDIAC REHAB PHASE II EXERCISE from 12/20/2015 in [place]  Date  12/20/15  Instruction Review Code  2- meets goals/outcomes      Cardiac Drugs II:  -Group instruction provided by verbal instruction and written materials to support subject.  Instructor reviews cardiac drug classes: angiotensin converting enzyme inhibitors (ACE-I), angiotensin II receptor blockers (ARBs), nitrates, and calcium channel blockers.  Instructor discusses reasons, side effects, and lifestyle considerations for each drug class.   Anatomy and Physiology of the Circulatory System:  -Group instruction provided by verbal instruction, video, and written materials to support subject.  Reviews functional anatomy of heart, how it relates to various diagnoses, and what role the heart plays in the overall system.   Knowledge Questionnaire Score:     Knowledge Questionnaire Score - 10/10/15 0750      Knowledge Questionnaire Score   Pre Score 21/24      Core Components/Risk Factors/Patient Goals at Admission:     Personal Goals and Risk Factors at Admission - 10/03/15 1623      Core Components/Risk Factors/Patient Goals on Admission    Weight Management Weight Loss   Intervention Weight Management: Develop a combined nutrition and exercise program designed to reach desired caloric intake, while maintaining appropriate intake of nutrient and fiber, sodium and fats, and appropriate energy expenditure  required for the weight goal.;Weight Management/Obesity: Establish reasonable short term and long term weight goals.;Weight Management: Provide education and appropriate resources to help participant work on and attain dietary goals.   Admit Weight 207 lb 3.7 oz (94 kg)   Expected Outcomes Weight Loss: Understanding of general recommendations for a balanced deficit meal plan, which promotes 1-2 lb weight loss per week and includes a negative energy balance of [phone]  kcal/d;Understanding of distribution of calorie intake throughout the day with the consumption of 4-5 meals/snacks   Personal Goal Other Yes   Personal Goal SHORT TERM:  weight loss; LONG TERM:  get back to exercise in [facility] program.    Intervention develop invidualized exercise prescription fo aerobic and resistive training baased on initial evaluation findings, risk stratification, and comorbidities, provide nutritional education and caloric needs planning.    Expected Outcomes achievement of increased cardiorespiratory fitness and enhanced flexability, muscular endurance and strength shown through measurements of functional capacity and personal statement of participant.       Core Components/Risk Factors/Patient Goals Review:      Goals and Risk Factor Review    Row Name 10/27/15 0742 11/24/15 0857 12/20/15 0810         Core Components/Risk Factors/Patient Goals Review   Personal Goals Review Weight Management/Obesity;Other Other Other     Review Patient is making excellent progress with exercise. She is walking 2 days per week at home and [facility] has lost 4.2 lbs since admission. Patient is doing well with exercise. [facility] addition to cardiac rehab. Pt has gained weight. Making slow progress with exercise. Increasing workloads as tolerated.     Expected Outcomes Achieve weight loss and fitness goals through regular exercise program at least 30 minutes 5-7 days per week. Continue to progress workloads to help achieve fitness and weight loss goals. Continue to progress workloads to help achieve fitness and weight loss goals.        Core Components/Risk Factors/Patient Goals at Discharge (Final Review):      Goals and Risk Factor Review - 12/20/15 0810      Core Components/Risk Factors/Patient Goals Review   Personal Goals Review Other   Review Making slow progress with exercise. Increasing workloads as  tolerated.   Expected Outcomes Continue to progress workloads to help achieve fitness and weight loss goals.      ITP Comments:     ITP Comments    Row Name 10/03/15 1438 10/13/15 1540         ITP Comments Medical Director- Fransico Him, MD attended Hypertension video/lecture education offering/met outcome, goals         Comments: Payten is making slow  progress toward personal goals after completing 25 sessions. Recommend continued exercise and life style modification education including  stress management and relaxation techniques to decrease cardiac risk profile. Garnet comes to exercise twice a week.Barnet Pall, RN,BSN 12/21/2015 2:12 PM

## 2015-12-22 ENCOUNTER — Encounter (HOSPITAL_COMMUNITY)
Admission: RE | Admit: 2015-12-22 | Discharge: 2015-12-22 | Disposition: A | Discharge status: Home / Self Care | Payer: Commercial Managed Care - HMO | Source: Ambulatory Visit | Attending: Cardiovascular Disease | Admitting: Cardiovascular Disease

## 2015-12-22 DIAGNOSIS — Z955 Presence of coronary angioplasty implant and graft: Secondary | ICD-10-CM | POA: Diagnosis not present

## 2015-12-25 ENCOUNTER — Encounter (HOSPITAL_COMMUNITY): Admission: RE | Admit: 2015-12-25 | Payer: Commercial Managed Care - HMO | Source: Ambulatory Visit

## 2015-12-26 ENCOUNTER — Other Ambulatory Visit (HOSPITAL_COMMUNITY): Payer: Commercial Managed Care - HMO

## 2015-12-27 ENCOUNTER — Encounter (HOSPITAL_COMMUNITY)
Admission: RE | Admit: 2015-12-27 | Discharge: 2015-12-27 | Disposition: A | Discharge status: Home / Self Care | Payer: Commercial Managed Care - HMO | Source: Ambulatory Visit | Attending: Cardiovascular Disease | Admitting: Cardiovascular Disease

## 2015-12-27 DIAGNOSIS — Z955 Presence of coronary angioplasty implant and graft: Secondary | ICD-10-CM | POA: Diagnosis not present

## 2015-12-29 ENCOUNTER — Encounter (HOSPITAL_COMMUNITY)
Admission: RE | Admit: 2015-12-29 | Discharge: 2015-12-29 | Disposition: A | Discharge status: Home / Self Care | Payer: Commercial Managed Care - HMO | Source: Ambulatory Visit | Attending: Cardiovascular Disease | Admitting: Cardiovascular Disease

## 2015-12-29 DIAGNOSIS — Z955 Presence of coronary angioplasty implant and graft: Secondary | ICD-10-CM

## 2016-01-01 ENCOUNTER — Encounter (HOSPITAL_COMMUNITY)
Admission: RE | Admit: 2016-01-01 | Discharge: 2016-01-01 | Disposition: A | Discharge status: Home / Self Care | Payer: Commercial Managed Care - HMO | Source: Ambulatory Visit | Attending: Cardiovascular Disease | Admitting: Cardiovascular Disease

## 2016-01-01 DIAGNOSIS — Z955 Presence of coronary angioplasty implant and graft: Secondary | ICD-10-CM

## 2016-01-03 ENCOUNTER — Encounter (HOSPITAL_COMMUNITY): Payer: Commercial Managed Care - HMO

## 2016-01-05 ENCOUNTER — Encounter (HOSPITAL_COMMUNITY)
Admission: RE | Admit: 2016-01-05 | Discharge: 2016-01-05 | Disposition: A | Discharge status: Home / Self Care | Payer: Commercial Managed Care - HMO | Source: Ambulatory Visit | Attending: Cardiovascular Disease | Admitting: Cardiovascular Disease

## 2016-01-05 DIAGNOSIS — Z955 Presence of coronary angioplasty implant and graft: Secondary | ICD-10-CM | POA: Diagnosis not present

## 2016-01-08 ENCOUNTER — Encounter (HOSPITAL_COMMUNITY)
Admission: RE | Admit: 2016-01-08 | Discharge: 2016-01-08 | Disposition: A | Discharge status: Home / Self Care | Payer: Commercial Managed Care - HMO | Source: Ambulatory Visit | Attending: Cardiovascular Disease | Admitting: Cardiovascular Disease

## 2016-01-08 DIAGNOSIS — Z955 Presence of coronary angioplasty implant and graft: Secondary | ICD-10-CM

## 2016-01-08 NOTE — Patient Instructions (Addendum)
  Test(s) ordered today. Your results will be released to MyChart (or called to you) after review, usually within 72hours after test completion. If any changes need to be made, you will be notified at that same time.  No immunizations administered today.   Medications reviewed and updated.  No changes recommended at this time.  Please followup in 6 months   

## 2016-01-08 NOTE — Progress Notes (Signed)
Subjective:    Patient ID: Yvonne Bailey, female    DOB: 07-08-1940, 75 y.o.   MRN: [id number]  HPI She is here for follow up.  CHF, CAD, Afib, Hypertension: She went to the hospital in June after seeing cardiology for a cardiac cath because of an abnormal perfusion scan.  She has severe triple vessel disease. She was seen by CTS and was not deemed to be a good surgical candidate and has had stenting, total of 5 stents.  She is taking her medication daily. She is compliant with a low sodium diet.  She denies chest pain, palpitations, edema, shortness of breath and regular headaches. She is exercising regularly with cardiac rehab - she is almost done and will start with silver sneakers.  She does monitor her blood pressure at home and it has been good.    Hypothyroidism:  She is taking her medication daily.  She denies any recent changes in energy or weight that are unexplained.   Hyperlipidemia: She is taking her medication daily. She is compliant with a low fat/cholesterol diet. She is exercising regularly with cardiac rehab. She denies myalgias.   Overall she feels good and has no complaints.   Medications and allergies reviewed with patient and updated if appropriate.  Patient Active Problem List   Diagnosis Date Noted  . Unstable angina (HCC) 09/06/2015  . Coronary artery disease involving native coronary artery of native heart with unstable angina pectoris (HCC)   . Cardiomyopathy, ischemic   . Chronic systolic heart failure (HCC) 08/21/2015  . Bilateral leg edema 07/19/2015  . Atrial fibrillation (HCC) 07/19/2015  . Urinary urgency 03/30/2015  . Other abnormal glucose 05/14/2013  . Other musculoskeletal symptoms referable to limbs(729.89) 12/17/2012  . 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  . Small vessel disease, cerebrovascular 12/17/2012  . POSTMENOPAUSAL SYNDROME 08/10/2008  . CAROTID BRUIT, RIGHT 08/10/2008  . Peripheral vascular disease (HCC) 06/04/2007  .  Diverticulosis of large intestine 06/04/2007  . Hypothyroidism 02/24/2007  . Hyperlipidemia 02/24/2007  . Essential hypertension 02/24/2007  . DVT 01/21/2007  . Coronary atherosclerosis 10/29/2006    Current Outpatient Prescriptions on File Prior to Visit  Medication Sig Dispense Refill  . apixaban (ELIQUIS) 5 MG TABS tablet Take 1 tablet (5 mg total) by mouth 2 (two) times daily. 180 tablet 2  . Ascorbic Acid (VITAMIN C) 100 MG tablet Take 100 mg by mouth daily.      Marland Kitchen atorvastatin (LIPITOR) 40 MG tablet Take 1 tablet (40 mg total) by mouth daily. 90 tablet 3  . benazepril (LOTENSIN) 20 MG tablet TAKE 1 TABLET EVERY DAY 90 tablet 2  . brimonidine (ALPHAGAN) 0.2 % ophthalmic solution PLACE ONE DROPTO THE LEFT EYE TWICE  DAILY  0  . cholecalciferol (VITAMIN D) 1000 UNITS tablet Take 1,000 Units by mouth daily.    . clopidogrel (PLAVIX) 75 MG tablet Take 1 tablet (75 mg total) by mouth daily. 30 tablet 12  . fish oil-omega-3 fatty acids 1000 MG capsule Take 1,000 mg by mouth daily.     . furosemide (LASIX) 40 MG tablet Take 1 tablet (40 mg total) by mouth 2 (two) times daily. 60 tablet 12  . latanoprost (XALATAN) 0.005 % ophthalmic solution Place 0.005 drops into both eyes every evening.    Marland Kitchen levothyroxine (SYNTHROID, LEVOTHROID) 88 MCG tablet Take 1 tablet (88 mcg total) by mouth daily. 90 tablet 3  . metoprolol succinate (TOPROL-XL) 50 MG 24 hr tablet Take 1 tablet (50 mg total) by  mouth 2 (two) times daily. Take with or immediately following a meal. 180 tablet 1  . Multiple Vitamin (MULTIVITAMIN) tablet Take 1 tablet by mouth daily.      . nitroGLYCERIN (NITROSTAT) 0.4 MG SL tablet Place 1 tablet (0.4 mg total) under the tongue every 5 (five) minutes as needed for chest pain. 25 tablet 1  . Potassium Chloride ER 20 MEQ TBCR Take 20 mEq by mouth daily. 90 tablet 3  . spironolactone (ALDACTONE) 25 MG tablet Take 1 tablet (25 mg total) by mouth daily. 30 tablet 12  . vitamin E 100 UNIT  capsule Take 100 Units by mouth daily.      Marland Kitchen. albuterol (VENTOLIN HFA) 108 (90 Base) MCG/ACT inhaler Inhale 1-2 puffs into the lungs every 6 (six) hours as needed for wheezing or shortness of breath. (Patient not taking: Reported on 01/09/2016) 18 g 1  . aspirin EC 81 MG EC tablet Take 1 tablet (81 mg total) by mouth daily. (Patient not taking: Reported on 01/09/2016)     No current facility-administered medications on file prior to visit.     Past Medical History:  Diagnosis Date  . CAD (coronary artery disease)    S/P  anterior  wall myocardial infarction in '92, treated w/percutaneous transluminal coronary angioplasty of the left anterior descending. EF 45%  . Carotid artery disease (HCC)   . DVT (deep venous thrombosis) (HCC)    X1  . History of nuclear stress test    a. Myoview 6/17: EF 20-25%, mid anteroseptal, apical anterior, apical septal, apical inferior, apical lateral and apical scar, no ischemia, intermediate risk  . HTN (hypertension)   . Hyperlipidemia   . Hypothyroidism   . Ischemic cardiomyopathy    a. Echo 6/17: EF 20-25%, apex appears akinetic, MAC, moderate MR, moderate LAE, mild RVE, trivial PI, PASP 47 mmHg (needs repeat with Definity contrast) //  b. Limited echo with Definity contrast 7/17: EF 25-30%, moderate to severe LAE  . MI (myocardial infarction) 1992   at age 75; TIA treated with coumadin until Plavix initiated in 2006  . PAD (peripheral artery disease) (HCC)    Right SFA occlusion, severe disease left CFA and SFA    Past Surgical History:  Procedure Laterality Date  . APPENDECTOMY     at hysterectomy and USO for fibroids, Dr. Kizzie BaneHughes  . CARDIAC CATHETERIZATION  1992   Dr Charlies ConstableBruce Brodie  . CARDIAC CATHETERIZATION N/A 09/06/2015   Procedure: Right/Left Heart Cath and Coronary Angiography;  Surgeon: Kathleene Hazelhristopher D McAlhany, MD;  [facility];  Service: Cardiovascular;  Laterality: N/A;  . CARDIAC CATHETERIZATION N/A 09/07/2015   Procedure:  Coronary Stent Intervention;  Surgeon: Kathleene Hazelhristopher D McAlhany, MD;  Location: [facility] INVASIVE CV LAB;  Service: Cardiovascular;  Laterality: N/A;  . COLONOSCOPY     negative; 2008, Dr. Lina Sarora Brodie  . fracture LLE     '94; pinned  . TONSILLECTOMY    . TOTAL ABDOMINAL HYSTERECTOMY     & BSO for fibroids    Social History   Social History  . Marital status: Married    Spouse name: Fayrene FearingJames  . Number of children: 1  . Years of education: college   Occupational History  . Teacher     Retired   Social History Main Topics  . Smoking status: Former Smoker    Quit date: 03/11/1990  . Smokeless tobacco: Never Used     Comment: smoked 1973- 1992, up to < 5 cigarettes  . Alcohol  use No  . Drug use: No  . Sexual activity: Not on file   Other Topics Concern  . 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 08/31/09- designated party form signed appointing, husband Lucinda Soliday; ok to leave msg on home phone [phone].      Caffeine Small Amount one cup very rare.   Right handed.   One child- [name]    Family History  Problem Relation Age of Onset  . Diabetes Mother   . Hypertension Mother   . Stroke Brother     ?> 55  . Coronary artery disease Brother     stent in 69s  . Cancer Neg Hx     Review of Systems  Constitutional: Negative for fatigue and fever.  Respiratory: Negative for cough, shortness of breath and wheezing.   Cardiovascular: Negative for chest pain, palpitations and leg swelling.  Neurological: Negative for headaches.       Objective:   Vitals:   01/09/16 1058  BP: 132/90  Pulse: 64  Resp: 16  Temp: 97.4 F (36.3 C)   Filed Weights   01/09/16 1058  Weight: 217 lb (98.4 kg)   Body mass index is 33.99 kg/m.   Physical Exam Constitutional: Appears well-developed and well-nourished. No distress.  HENT:  Head: Normocephalic and atraumatic.  Neck: Neck supple. No tracheal  deviation present. No thyromegaly present.  No cervical lymphadenopathy Cardiovascular: Normal rate, regular rhythm and normal heart sounds.   2/6 systolic murmur heard. No carotid bruit .  trace edema b/l LE Pulmonary/Chest: Effort normal and breath sounds normal. No respiratory distress. No has no wheezes. No rales.  Skin: Skin is warm and dry. Not diaphoretic.  Psychiatric: Normal mood and affect. Behavior is normal.       Assessment & Plan:   See Problem List for Assessment and Plan of chronic medical problems.   F/u in 6 months

## 2016-01-09 ENCOUNTER — Other Ambulatory Visit (INDEPENDENT_AMBULATORY_CARE_PROVIDER_SITE_OTHER): Payer: Commercial Managed Care - HMO

## 2016-01-09 ENCOUNTER — Ambulatory Visit (INDEPENDENT_AMBULATORY_CARE_PROVIDER_SITE_OTHER): Payer: Commercial Managed Care - HMO | Admitting: Internal Medicine

## 2016-01-09 ENCOUNTER — Encounter: Payer: Self-pay | Admitting: Internal Medicine

## 2016-01-09 VITALS — BP 132/90 | HR 64 | Temp 97.4°F | Resp 16 | Ht 67.0 in | Wt 217.0 lb

## 2016-01-09 DIAGNOSIS — I10 Essential (primary) hypertension: Secondary | ICD-10-CM

## 2016-01-09 DIAGNOSIS — I25110 Atherosclerotic heart disease of native coronary artery with unstable angina pectoris: Secondary | ICD-10-CM

## 2016-01-09 DIAGNOSIS — I5022 Chronic systolic (congestive) heart failure: Secondary | ICD-10-CM | POA: Diagnosis not present

## 2016-01-09 DIAGNOSIS — E785 Hyperlipidemia, unspecified: Secondary | ICD-10-CM

## 2016-01-09 DIAGNOSIS — E038 Other specified hypothyroidism: Secondary | ICD-10-CM

## 2016-01-09 DIAGNOSIS — R7309 Other abnormal glucose: Secondary | ICD-10-CM

## 2016-01-09 LAB — COMPREHENSIVE METABOLIC PANEL
ALT: 15 U/L (ref 0–35)
AST: 17 U/L (ref 0–37)
Albumin: 4.2 g/dL (ref 3.5–5.2)
Alkaline Phosphatase: 98 U/L (ref 39–117)
BUN: 23 mg/dL (ref 6–23)
CO2: 28 mEq/L (ref 19–32)
Calcium: 9.4 mg/dL (ref 8.4–10.5)
Chloride: 106 mEq/L (ref 96–112)
Creatinine, Ser: 1.04 mg/dL (ref 0.40–1.20)
GFR: 66.35 mL/min (ref >60.00)
Glucose, Bld: 108 mg/dL — ABNORMAL HIGH (ref 70–99)
Potassium: 3.9 mEq/L (ref 3.5–5.1)
Sodium: 142 mEq/L (ref 135–145)
Total Bilirubin: 0.7 mg/dL (ref 0.2–1.2)
Total Protein: 7.3 g/dL (ref 6.0–8.3)

## 2016-01-09 LAB — LIPID PANEL
Cholesterol: 171 mg/dL (ref 0–200)
HDL: 47.7 mg/dL (ref >39.00)
LDL Cholesterol: 106 mg/dL — ABNORMAL HIGH (ref 0–99)
NonHDL: 122.95
Total CHOL/HDL Ratio: 4
Triglycerides: 87 mg/dL (ref 0.0–149.0)
VLDL: 17.4 mg/dL (ref 0.0–40.0)

## 2016-01-09 LAB — HEMOGLOBIN A1C: Hgb A1c MFr Bld: 5.4 % (ref 4.6–6.5)

## 2016-01-09 LAB — TSH: TSH: 10.15 u[IU]/mL — ABNORMAL HIGH (ref 0.35–4.50)

## 2016-01-09 NOTE — Progress Notes (Signed)
Pre visit review using our clinic review tool, if applicable. No additional management support is needed unless otherwise documented below in the visit note. 

## 2016-01-09 NOTE — Assessment & Plan Note (Signed)
Check lipid panel, cmp Continue statin 

## 2016-01-09 NOTE — Assessment & Plan Note (Signed)
Doing well after PCI a few months ago No chest pain, SOB Continue current medications Continue regular exercise

## 2016-01-09 NOTE — Assessment & Plan Note (Signed)
a1c

## 2016-01-09 NOTE — Assessment & Plan Note (Signed)
Check tsh  Titrate med dose if needed  

## 2016-01-09 NOTE — Assessment & Plan Note (Signed)
euvolemic Following with cardiology Continue current medications - per cardiology cmp today

## 2016-01-09 NOTE — Assessment & Plan Note (Addendum)
BP slightly elevated here today, but she did not take her medication today yet It has been controlled at cardiac rehab and at home Continue current medications

## 2016-01-10 ENCOUNTER — Encounter (HOSPITAL_COMMUNITY)
Admission: RE | Admit: 2016-01-10 | Discharge: 2016-01-10 | Disposition: A | Discharge status: Home / Self Care | Payer: Commercial Managed Care - HMO | Source: Ambulatory Visit | Attending: Cardiovascular Disease | Admitting: Cardiovascular Disease

## 2016-01-10 DIAGNOSIS — Z955 Presence of coronary angioplasty implant and graft: Secondary | ICD-10-CM | POA: Insufficient documentation

## 2016-01-12 ENCOUNTER — Encounter (HOSPITAL_COMMUNITY)
Admission: RE | Admit: 2016-01-12 | Discharge: 2016-01-12 | Disposition: A | Discharge status: Home / Self Care | Payer: Commercial Managed Care - HMO | Source: Ambulatory Visit | Attending: Cardiovascular Disease | Admitting: Cardiovascular Disease

## 2016-01-12 DIAGNOSIS — Z955 Presence of coronary angioplasty implant and graft: Secondary | ICD-10-CM | POA: Diagnosis not present

## 2016-01-12 NOTE — Progress Notes (Signed)
Discharge Summary  Patient Details  Name: Yvonne Bailey MRN: [id number] Date of Birth: October 07, 1940 Referring Provider:   Flowsheet Row CARDIAC REHAB PHASE II ORIENTATION from 10/03/2015 in [place]  Referring Provider  Darlina Guys , MD       Number of Visits: 32  Reason for Discharge:  Patient reached a stable level of exercise.  Smoking History:  History  Smoking Status  . Former Smoker  . Quit date: 03/11/1990  Smokeless Tobacco  . Never Used    Comment: smoked 1973- 1992, up to < 5 cigarettes    Diagnosis:  Status post coronary artery stent placement  ADL UCSD:   Initial Exercise Prescription:     Initial Exercise Prescription - 10/04/15 0900      Date of Initial Exercise RX and Referring Provider   Date 10/03/15   Referring Provider Darlina Guys , MD     Bike   Level 0.5   Minutes 10   METs 1.99     NuStep   Level 1   Minutes 10   METs 1.8     Track   Laps 6   Minutes 10   METs 2.03     Prescription Details   Frequency (times per week) 3   Duration Progress to 30 minutes of continuous aerobic without signs/symptoms of physical distress     Intensity   THRR 40-80% of Max Heartrate 58-116   Ratings of Perceived Exertion 11-13   Perceived Dyspnea 0-4     Progression   Progression Continue to progress workloads to maintain intensity without signs/symptoms of physical distress.     Resistance Training   Training Prescription Yes   Weight 1 lbs   Reps 10-12      Discharge Exercise Prescription (Final Exercise Prescription Changes):     Exercise Prescription Changes - 01/22/16 1100      Response to Exercise   Blood Pressure (Admit) 134/82   Blood Pressure (Exercise) 132/82   Blood Pressure (Exit) 108/74   Heart Rate (Admit) 74 bpm   Heart Rate (Exercise) 103 bpm   Heart Rate (Exit) 68 bpm   Rating of Perceived Exertion (Exercise) 11   Comments Reviewed home exercise guidelines on 10/13/15.    Duration Progress to 30 minutes of continuous aerobic without signs/symptoms of physical distress   Intensity THRR unchanged     Progression   Progression Continue to progress workloads to maintain intensity without signs/symptoms of physical distress.   Average METs 1.8     Resistance Training   Training Prescription Yes   Weight 3lbs   Reps 10-12     Interval Training   Interval Training No     NuStep   Level 5   Minutes 20   METs 1.7     Track   Laps 3   Minutes 10   METs 1.52     Home Exercise Plan   Plans to continue exercise at [facility] (comment)   Frequency Add 3 additional days to program exercise sessions.      Functional Capacity:     6 Minute Walk    Row Name 10/03/15 1533 01/08/16 1702 01/10/16 0813     6 Minute Walk   Phase Initial (P)  Discharge Discharge   Distance 600 feet (P)  693 feet 693 feet   Distance % Change  - (P)  15.5 % 15.5 %   Walk Time 6 minutes (P)  6 minutes 6 minutes   #  of Rest Breaks 0 (P)  0 0   MPH 1.14  - 1.31   METS 0.91  - 1.38   RPE 11  - 12   Perceived Dyspnea   -  - 0   VO2 Peak 3.21  - 4.8   Symptoms No  - No   Resting HR 80 bpm  - 85 bpm   Resting BP 120/70  - 122/70   Max Ex. HR 98 bpm  - 117 bpm   Max Ex. BP 108/64  - 132/82   2 Minute Post BP 116/78  - 116/78      Psychological, QOL, Others - Outcomes: PHQ 2/9: Depression screen [facility] 2/9 01/12/2016 11/02/2015 10/09/2015 12/15/2014 05/14/2013  Decreased Interest 0 0 0 0 0  Down, Depressed, Hopeless 0 0 0 0 0  PHQ - 2 Score 0 0 0 0 0    Quality of Life:     Quality of Life - 01/22/16 1110      Quality of Life Scores   Health/Function Pre 28 %   Health/Function Post 27.25 %   Health/Function % Change -2.68 %   Socioeconomic Pre 29.29 %   Socioeconomic Post 30 %   Socioeconomic % Change  2.42 %   Psych/Spiritual Pre 28.29 %   Psych/Spiritual Post 29.14 %   Psych/Spiritual % Change 3 %   Family Pre 30 %   Family Post 30 %   Family % Change 0  %   GLOBAL Pre 28.59 %   GLOBAL Post 28.56 %   GLOBAL % Change -0.1 %      Personal Goals: Goals established at orientation with interventions provided to work toward goal.     Personal Goals and Risk Factors at Admission - 10/03/15 1623      Core Components/Risk Factors/Patient Goals on Admission    Weight Management Weight Loss   Intervention Weight Management: Develop a combined nutrition and exercise program designed to reach desired caloric intake, while maintaining appropriate intake of nutrient and fiber, sodium and fats, and appropriate energy expenditure required for the weight goal.;Weight Management/Obesity: Establish reasonable short term and long term weight goals.;Weight Management: Provide education and appropriate resources to help participant work on and attain dietary goals.   Admit Weight 207 lb 3.7 oz (94 kg)   Expected Outcomes Weight Loss: Understanding of general recommendations for a balanced deficit meal plan, which promotes 1-2 lb weight loss per week and includes a negative energy balance of [phone] kcal/d;Understanding of distribution of calorie intake throughout the day with the consumption of 4-5 meals/snacks   Personal Goal Other Yes   Personal Goal SHORT TERM:  weight loss; LONG TERM:  get back to exercise in [employer] program.    Intervention develop invidualized exercise prescription fo aerobic and resistive training baased on initial evaluation findings, risk stratification, and comorbidities, provide nutritional education and caloric needs planning.    Expected Outcomes achievement of increased cardiorespiratory fitness and enhanced flexability, muscular endurance and strength shown through measurements of functional capacity and personal statement of participant.        Personal Goals Discharge:     Goals and Risk Factor Review    Row Name 10/27/15 0742 11/24/15 0857 12/20/15 0810 01/19/16 1335       Core Components/Risk Factors/Patient  Goals Review   Personal Goals Review Weight Management/Obesity;Other Other Other Weight Management/Obesity    Review Patient is making excellent progress with exercise. She is walking 2 days per week at home  [facility] has lost 4.2 lbs since admission. Patient is doing well with exercise. [facility] in [facility] to cardiac rehab. Pt has gained weight. Making slow progress with exercise. Increasing workloads as tolerated. Pt has gained 7.3 lb. Wt loss goal not met.    Expected Outcomes Achieve weight loss and fitness goals through regular exercise program at least 30 minutes 5-7 days per week. Continue to progress workloads to help achieve fitness and weight loss goals. Continue to progress workloads to help achieve fitness and weight loss goals. Continue to progress workloads to help achieve fitness and weight loss goals.       Nutrition & Weight - Outcomes:     Pre Biometrics - 10/04/15 0944      Pre Biometrics   Height '5\' 7"'$  (1.702 m)   Weight 208 lb 15.9 oz (94.8 kg)   Waist Circumference 35.5 inches   Hip Circumference 48 inches   Waist to Hip Ratio 0.74 %   BMI (Calculated) 32.8   Triceps Skinfold 58 mm   % Body Fat 45.7 %   Grip Strength 35 kg   Flexibility 9.5 in   Single Leg Stand 1.21 seconds         Post Biometrics - 01/10/16 1129       Post  Biometrics   Height '5\' 7"'$  (1.702 m)   Weight 216 lb 4.3 oz (98.1 kg)   Waist Circumference 38 inches   Hip Circumference 50 inches   Waist to Hip Ratio 0.76 %   BMI (Calculated) 33.9   Triceps Skinfold 55 mm   % Body Fat 46.9 %   Grip Strength 36.5 kg   Flexibility 10 in   Single Leg Stand 1.4 seconds      Nutrition:     Nutrition Therapy & Goals - 10/05/15 1501      Nutrition Therapy   Diet Therapeutic Lifestyle Changes     Personal Nutrition Goals   Personal Goal #1 1-2 lb wt loss/week to a goal wt loss of 6-24 lb at graduation from [place], educate and counsel regarding individualized specific dietary modifications aiming towards targeted core components such as weight, hypertension, lipid management, diabetes, heart failure and other comorbidities.   Expected Outcomes Short Term Goal: Understand basic principles of dietary content, such as calories, fat, sodium, cholesterol and nutrients.;Long Term Goal: Adherence to prescribed nutrition plan.      Nutrition Discharge:     Nutrition Assessments - 01/19/16 1332      MEDFICTS Scores   Pre Score 54   Post Score 15   Score Difference -39      Education Questionnaire Score:     Knowledge Questionnaire Score - 01/22/16 1104      Knowledge Questionnaire Score   Post Score 19/24      Goals reviewed with patient; copy given to patient.Pt graduated from cardiac rehab program today with completion of 32 exercise sessions in Phase II. Pt maintained good attendance and progressed nicely during her participation in rehab as evidenced by increased MET level.   Medication list reconciled. Repeat  PHQ score- 0 .  Pt has made significant lifestyle changes and should be commended for her success. Pt feels she has achieved his goals during cardiac rehab.   [facility] [place].Christa See  Jeryn Bertoni Hydrographic surveyor.

## 2016-01-13 ENCOUNTER — Other Ambulatory Visit: Payer: Self-pay | Admitting: Internal Medicine

## 2016-01-13 ENCOUNTER — Encounter: Payer: Self-pay | Admitting: Internal Medicine

## 2016-01-13 DIAGNOSIS — E038 Other specified hypothyroidism: Secondary | ICD-10-CM

## 2016-01-13 MED ORDER — LEVOTHYROXINE SODIUM 100 MCG PO TABS: 100.0000 ug | ORAL_TABLET | Freq: Every day | ORAL | 3 refills | Status: DC

## 2016-01-15 ENCOUNTER — Encounter (HOSPITAL_COMMUNITY): Payer: Commercial Managed Care - HMO

## 2016-01-17 ENCOUNTER — Encounter (HOSPITAL_COMMUNITY): Payer: Commercial Managed Care - HMO

## 2016-01-19 ENCOUNTER — Encounter (HOSPITAL_COMMUNITY): Payer: Commercial Managed Care - HMO

## 2016-01-22 ENCOUNTER — Encounter (HOSPITAL_COMMUNITY): Payer: Commercial Managed Care - HMO

## 2016-01-24 ENCOUNTER — Encounter (HOSPITAL_COMMUNITY): Payer: Commercial Managed Care - HMO

## 2016-01-26 ENCOUNTER — Encounter (HOSPITAL_COMMUNITY): Payer: Commercial Managed Care - HMO

## 2016-01-27 ENCOUNTER — Encounter: Payer: Self-pay | Admitting: Internal Medicine

## 2016-01-29 ENCOUNTER — Encounter (HOSPITAL_COMMUNITY): Payer: Commercial Managed Care - HMO

## 2016-01-29 ENCOUNTER — Other Ambulatory Visit (HOSPITAL_COMMUNITY): Payer: Commercial Managed Care - HMO

## 2016-01-31 ENCOUNTER — Encounter (HOSPITAL_COMMUNITY): Payer: Commercial Managed Care - HMO

## 2016-02-02 ENCOUNTER — Encounter (HOSPITAL_COMMUNITY): Payer: Commercial Managed Care - HMO

## 2016-02-05 ENCOUNTER — Encounter (HOSPITAL_COMMUNITY): Payer: Commercial Managed Care - HMO

## 2016-02-07 ENCOUNTER — Encounter (HOSPITAL_COMMUNITY): Payer: Commercial Managed Care - HMO

## 2016-02-08 ENCOUNTER — Other Ambulatory Visit: Payer: Self-pay

## 2016-02-08 ENCOUNTER — Ambulatory Visit (HOSPITAL_COMMUNITY): Payer: Commercial Managed Care - HMO | Attending: Internal Medicine

## 2016-02-08 DIAGNOSIS — I071 Rheumatic tricuspid insufficiency: Secondary | ICD-10-CM | POA: Diagnosis not present

## 2016-02-08 DIAGNOSIS — I255 Ischemic cardiomyopathy: Secondary | ICD-10-CM | POA: Insufficient documentation

## 2016-02-08 MED ORDER — PERFLUTREN LIPID MICROSPHERE
1.0000 mL | INTRAVENOUS | Status: AC | PRN
  Administered 2016-02-08: 3 mL via INTRAVENOUS

## 2016-02-08 NOTE — Progress Notes (Signed)
Patient experienced back pain with administration of definity. DOD Dr Excell Seltzer notified. Vital signs were stable and symptoms resolved  In 15 minutes. Patient was allowed to leave after 30 minutes of observation.

## 2016-02-09 ENCOUNTER — Encounter (HOSPITAL_COMMUNITY): Payer: Commercial Managed Care - HMO

## 2016-02-12 ENCOUNTER — Encounter: Payer: Self-pay | Admitting: Cardiovascular Disease

## 2016-02-12 ENCOUNTER — Ambulatory Visit (INDEPENDENT_AMBULATORY_CARE_PROVIDER_SITE_OTHER): Payer: Commercial Managed Care - HMO | Admitting: Cardiovascular Disease

## 2016-02-12 VITALS — BP 130/70 | HR 60 | Ht 67.0 in | Wt 225.0 lb

## 2016-02-12 DIAGNOSIS — I255 Ischemic cardiomyopathy: Secondary | ICD-10-CM

## 2016-02-12 DIAGNOSIS — I2510 Atherosclerotic heart disease of native coronary artery without angina pectoris: Secondary | ICD-10-CM | POA: Diagnosis not present

## 2016-02-12 DIAGNOSIS — I482 Chronic atrial fibrillation, unspecified: Secondary | ICD-10-CM

## 2016-02-12 DIAGNOSIS — I10 Essential (primary) hypertension: Secondary | ICD-10-CM

## 2016-02-12 DIAGNOSIS — I4819 Other persistent atrial fibrillation: Secondary | ICD-10-CM

## 2016-02-12 DIAGNOSIS — I5022 Chronic systolic (congestive) heart failure: Secondary | ICD-10-CM | POA: Diagnosis not present

## 2016-02-12 DIAGNOSIS — I481 Persistent atrial fibrillation: Secondary | ICD-10-CM | POA: Diagnosis not present

## 2016-02-12 MED ORDER — APIXABAN 5 MG PO TABS: 5.0000 mg | ORAL_TABLET | Freq: Two times a day (BID) | ORAL | 3 refills | Status: DC

## 2016-02-12 MED ORDER — ATORVASTATIN CALCIUM 40 MG PO TABS
40.0000 mg | ORAL_TABLET | Freq: Every day | ORAL | 3 refills | Status: DC
Start: 2016-02-12 — End: 2016-05-10

## 2016-02-12 MED ORDER — FUROSEMIDE 40 MG PO TABS: 40.0000 mg | ORAL_TABLET | Freq: Every day | ORAL | 3 refills | Status: DC

## 2016-02-12 MED ORDER — CLOPIDOGREL BISULFATE 75 MG PO TABS: 75.0000 mg | ORAL_TABLET | Freq: Every day | ORAL | 3 refills | Status: DC

## 2016-02-12 MED ORDER — METOPROLOL SUCCINATE ER 50 MG PO TB24: 50.0000 mg | ORAL_TABLET | Freq: Two times a day (BID) | ORAL | 3 refills | Status: DC

## 2016-02-12 NOTE — Progress Notes (Signed)
Chief Complaint  Patient presents with  . Follow-up   History of Present Illness: 75 yo female with history of CAD, HTN, PAD, carotid artery disease, hyperlipidemia and PAF here today for cardiac followup. In 1992 she had an anterior MI treated with PCI. Her ejection fraction in 2007 was 45%. She also has carotid disease and had Doppler studies January 2017 which showed 0-39% stenosis bilaterally. She is known to have LE arterial disease with occlusion of the right SFA and severe stenosis left CFA and SFA. ABI January 2017 with 0.65 on right and 0.72 on the left. I saw her in the office 05/23/14 and she was in atrial fibrillation. Eliquis was started for anti-coagulation. Toprol was started for rate control. She was seen in primary care with increased dyspnea in May 2017 and then seen in our office by Tereso Newcomer, PA-C. She was treated with prednisone, bronchodilators and azithromycin in primary care. Echo June 2017 with fall in LVEF. Cath June 2017 with severe three vessel CAD. She was turned down for CABG. I performed PCI with placement of drug eluting stents in the proximal and mid RCA, proximal Circumflex, OM2, Diagonal 1. Echo 02/08/16 with LVEF=40-45%, moderate TR, trivial MR.   She is here today for follow up. She denies chest pain or dyspnea. She only has leg pain if she walks long distances. No rest pain or ulcerations. No awareness of palpitations. No bleeding on Eliquis.    Primary Care Physician: Pincus Sanes, MD   Past Medical History:  Diagnosis Date  . CAD (coronary artery disease)    S/P  anterior  wall myocardial infarction in '92, treated w/percutaneous transluminal coronary angioplasty of the left anterior descending. EF 45%  . Carotid artery disease (HCC)   . DVT (deep venous thrombosis) (HCC)    X1  . History of nuclear stress test    a. Myoview 6/17: EF 20-25%, mid anteroseptal, apical anterior, apical septal, apical inferior, apical lateral and apical scar, no  ischemia, intermediate risk  . HTN (hypertension)   . Hyperlipidemia   . Hypothyroidism   . Ischemic cardiomyopathy    a. Echo 6/17: EF 20-25%, apex appears akinetic, MAC, moderate MR, moderate LAE, mild RVE, trivial PI, PASP 47 mmHg (needs repeat with Definity contrast) //  b. Limited echo with Definity contrast 7/17: EF 25-30%, moderate to severe LAE  . MI (myocardial infarction) 1992   at age 41; TIA treated with coumadin until Plavix initiated in 2006  . PAD (peripheral artery disease) (HCC)    Right SFA occlusion, severe disease left CFA and SFA    Past Surgical History:  Procedure Laterality Date  . APPENDECTOMY     at hysterectomy and USO for fibroids, Dr. Kizzie Bane  . CARDIAC CATHETERIZATION  1992   Dr Charlies Constable  . CARDIAC CATHETERIZATION N/A 09/06/2015   Procedure: Right/Left Heart Cath and Coronary Angiography;  Surgeon: Kathleene Hazel, MD;  Location: [facility] INVASIVE CV LAB;  Service: Cardiovascular;  Laterality: N/A;  . CARDIAC CATHETERIZATION N/A 09/07/2015   Procedure: Coronary Stent Intervention;  Surgeon: Kathleene Hazel, MD;  Location: [facility] INVASIVE CV LAB;  Service: Cardiovascular;  Laterality: N/A;  . COLONOSCOPY     negative; 2008, Dr. Lina Sar  . fracture LLE     '94; pinned  . TONSILLECTOMY    . TOTAL ABDOMINAL HYSTERECTOMY     & BSO for fibroids    Current Outpatient Prescriptions  Medication Sig Dispense Refill  . albuterol (VENTOLIN HFA)  108 (90 Base) MCG/ACT inhaler Inhale 1-2 puffs into the lungs every 6 (six) hours as needed for wheezing or shortness of breath. 18 g 1  . apixaban (ELIQUIS) 5 MG TABS tablet Take 1 tablet (5 mg total) by mouth 2 (two) times daily. 180 tablet 3  . Ascorbic Acid (VITAMIN C) 100 MG tablet Take 100 mg by mouth daily.      Marland Kitchen. atorvastatin (LIPITOR) 40 MG tablet Take 1 tablet (40 mg total) by mouth daily. 90 tablet 3  . benazepril (LOTENSIN) 20 MG tablet TAKE 1 TABLET EVERY DAY 90 tablet 2  . brimonidine (ALPHAGAN)  0.2 % ophthalmic solution PLACE ONE DROPTO THE LEFT EYE TWICE  DAILY  0  . cholecalciferol (VITAMIN D) 1000 UNITS tablet Take 1,000 Units by mouth daily.    . clopidogrel (PLAVIX) 75 MG tablet Take 1 tablet (75 mg total) by mouth daily. 90 tablet 3  . fish oil-omega-3 fatty acids 1000 MG capsule Take 1,000 mg by mouth daily.     Marland Kitchen. latanoprost (XALATAN) 0.005 % ophthalmic solution Place 0.005 drops into both eyes every evening.    Marland Kitchen. levothyroxine (SYNTHROID, LEVOTHROID) 100 MCG tablet Take 1 tablet (100 mcg total) by mouth daily. 90 tablet 3  . metoprolol succinate (TOPROL-XL) 50 MG 24 hr tablet Take 1 tablet (50 mg total) by mouth 2 (two) times daily. Take with or immediately following a meal. 180 tablet 3  . Multiple Vitamin (MULTIVITAMIN) tablet Take 1 tablet by mouth daily.      . nitroGLYCERIN (NITROSTAT) 0.4 MG SL tablet Place 1 tablet (0.4 mg total) under the tongue every 5 (five) minutes as needed for chest pain. 25 tablet 1  . Potassium Chloride ER 20 MEQ TBCR Take 20 mEq by mouth daily. 90 tablet 3  . spironolactone (ALDACTONE) 25 MG tablet Take 1 tablet (25 mg total) by mouth daily. 30 tablet 12  . vitamin E 100 UNIT capsule Take 100 Units by mouth daily.      . furosemide (LASIX) 40 MG tablet Take 1 tablet (40 mg total) by mouth daily. 90 tablet 3   No current facility-administered medications for this visit.     Allergies  Allergen Reactions  . Definity [Perflutren Lipid Microsphere] Other (See Comments)    Patient experienced back pain with injection  . Cilostazol Other (See Comments)     Pletal :" head felt funny"    Social History   Social History  . Marital status: Married    Spouse name: Fayrene FearingJames  . Number of children: 1  . Years of education: college   Occupational History  . Teacher     Retired   Social History Main Topics  . Smoking status: Former Smoker    Quit date: 03/11/1990  . Smokeless tobacco: Never Used     Comment: smoked 1973- 1992, up to < 5  cigarettes  . Alcohol use No  . Drug use: No  . Sexual activity: Not on file   Other Topics Concern  . 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 08/31/09- designated party form signed appointing, husband Zorita PangJames Filla; ok to leave msg on home phone [phone].      Caffeine Small Amount one cup very rare.   Right handed.   One child- Princella PellegriniJohn Rogers    Family History  Problem Relation Age of Onset  . Diabetes Mother   . Hypertension Mother   .  Stroke Brother     ?> 55  . Coronary artery disease Brother     stent in 75s  . Cancer Neg Hx     Review of Systems:  As stated in the HPI and otherwise negative.   BP 130/70   Pulse 60   Ht 5\' 7"  (1.702 m)   Wt 225 lb (102.1 kg)   BMI 35.24 kg/m   Physical Examination: General: Well developed, well nourished, NAD  HEENT: OP clear, mucus membranes moist  SKIN: warm, dry. No rashes. Neuro: No focal deficits  Musculoskeletal: Muscle strength 5/5 all ext  Psychiatric: Mood and affect normal  Neck: No JVD, no carotid bruits, no thyromegaly, no lymphadenopathy.  Lungs:Clear bilaterally, no wheezes, rhonci, crackles Cardiovascular: Regular rate and rhythm. No murmurs, gallops or rubs. Abdomen:Soft. Bowel sounds present. Non-tender.  Extremities: No lower extremity edema. Pulses are non-palpable in the bilateral DP/PT.  Echo 02/08/16: Left ventricle: The cavity size was normal. Wall thickness was   normal. Systolic function was mildly to moderately reduced. The   estimated ejection fraction was in the range of 40% to 45%. - Left atrium: The atrium was moderately dilated. - Tricuspid valve: There was moderate regurgitation. - Pulmonary arteries: PA peak pressure: 47 mm Hg (S).  EKG:  EKG is not ordered today. The ekg ordered today demonstrates   Recent Labs: 07/25/2015: Brain Natriuretic Peptide 177.4 09/15/2015: Hemoglobin 14.4; Platelets 318 01/09/2016: ALT  15; BUN 23; Creatinine, Ser 1.04; Potassium 3.9; Sodium 142; TSH 10.15   Lipid Panel    Component Value Date/Time   CHOL 171 01/09/2016 1134   TRIG 87.0 01/09/2016 1134   HDL 47.70 01/09/2016 1134   CHOLHDL 4 01/09/2016 1134   VLDL 17.4 01/09/2016 1134   LDLCALC 106 (H) 01/09/2016 1134     Wt Readings from Last 3 Encounters:  02/12/16 225 lb (102.1 kg)  01/10/16 216 lb 4.3 oz (98.1 kg)  01/09/16 217 lb (98.4 kg)     Other studies Reviewed: Additional studies/ records that were reviewed today include: . Review of the above records demonstrates:   Assessment and Plan:   1. Chronic systolic CHF: Volume status is ok. She would like to cut Lasix back to once daily. Will change Lasix 40 mg once daily.   2. CAD without angina: She has no chest pain suggestive of angina. Recent multivessel coronary intervention June 2017 with placement of drug eluting stents in the ostial and mid RCA, proximal Circumflex, OM2, Diagonal 1. She had been turned down for CABG. Will continue Plavix, statin, beta-blocker.  3. Ischemic CM: LVEF improved to 45% post PCI. Will continue beta-blocker, ARB and aldactone.   4. Persistent Atrial Fibrillation: HR is controlled. CHADS2-VASc=6. Continue Eliquis. Beta blocker for rate control.   5. HTN: BP controlled. NO changes today.   Current medicines are reviewed at length with the patient today.  The patient does not have concerns regarding medicines.  The following changes have been made:  Stop ASA  Labs/ tests ordered today include:   No orders of the defined types were placed in this encounter.   Disposition:   FU with me in 6 months  Signed, [name], MD 02/12/2016 9:24 AM    [facility] [address] [place], [place], [place]  [address] Phone: [phone]; Fax: [phone]

## 2016-02-12 NOTE — Patient Instructions (Signed)
Medication Instructions:  Your physician has recommended you make the following change in your medication:  Decrease furosemide to 40 mg by mouth daily.   Labwork: none  Testing/Procedures: none  Follow-Up: Your physician recommends that you schedule a follow-up appointment in: 6 months.  Please call our office in about 3 months to schedule this appointment.     Any Other Special Instructions Will Be Listed Below (If Applicable).     If you need a refill on your cardiac medications before your next appointment, please call your pharmacy.

## 2016-02-22 ENCOUNTER — Other Ambulatory Visit (HOSPITAL_COMMUNITY): Payer: Commercial Managed Care - HMO

## 2016-04-09 DIAGNOSIS — L500 Allergic urticaria: Secondary | ICD-10-CM | POA: Diagnosis not present

## 2016-04-16 ENCOUNTER — Encounter: Payer: Self-pay | Admitting: Internal Medicine

## 2016-04-16 ENCOUNTER — Ambulatory Visit (INDEPENDENT_AMBULATORY_CARE_PROVIDER_SITE_OTHER): Payer: Medicare HMO | Admitting: Internal Medicine

## 2016-04-16 DIAGNOSIS — L299 Pruritus, unspecified: Secondary | ICD-10-CM | POA: Diagnosis not present

## 2016-04-16 MED ORDER — CLOTRIMAZOLE-BETAMETHASONE 1-0.05 % EX CREA: 1.0000 "application " | TOPICAL_CREAM | Freq: Two times a day (BID) | CUTANEOUS | 0 refills | Status: DC

## 2016-04-16 NOTE — Patient Instructions (Signed)
Start the cream twice daily until you see dermatology next week.    The prescription was sent to your pharmacy.   You can also start claritin 10 mg daily, which is over the counter.  That will also help with your runny nose.

## 2016-04-16 NOTE — Progress Notes (Signed)
Subjective:    Patient ID: Yvonne Bailey, female    DOB: 1940-04-07, 76 y.o.   MRN: [id number]  HPI She is here for an acute visit.   Itching:  She has had itching on her back since she was in the hospital last June.  The itching has been intermittent.  At some points the itching is diffuse and other times it is only in certain areas of her back.  She has gone to urgent care and she received an injection - steroid.   It helped for one day.  She sometimes has itching on her stomach.  She denies rash.    Medications and allergies reviewed with patient and updated if appropriate.  Patient Active Problem List   Diagnosis Date Noted  . Unstable angina ([place]) 09/06/2015  . Coronary artery disease involving native coronary artery of native heart with unstable angina pectoris ([place])   . Cardiomyopathy, ischemic   . Chronic systolic heart failure ([place]) 08/21/2015  . Bilateral leg edema 07/19/2015  . Atrial fibrillation ([place]) 07/19/2015  . Urinary urgency 03/30/2015  . Other abnormal glucose 05/14/2013  . Other musculoskeletal symptoms referable to limbs(729.89) 12/17/2012  . Lacunar infarction ([place]) 12/17/2012  . Small vessel disease, cerebrovascular 12/17/2012  . CAROTID BRUIT, RIGHT 08/10/2008  . Peripheral vascular disease ([place]) 06/04/2007  . Diverticulosis of large intestine 06/04/2007  . Hypothyroidism 02/24/2007  . Hyperlipidemia 02/24/2007  . Essential hypertension 02/24/2007  . DVT 01/21/2007  . Coronary atherosclerosis 10/29/2006    Current Outpatient Prescriptions on File Prior to Visit  Medication Sig Dispense Refill  . albuterol (VENTOLIN HFA) 108 (90 Base) MCG/ACT inhaler Inhale 1-2 puffs into the lungs every 6 (six) hours as needed for wheezing or shortness of breath. 18 g 1  . apixaban (ELIQUIS) 5 MG TABS tablet Take 1 tablet (5 mg total) by mouth 2 (two) times daily. 180 tablet 3  . Ascorbic Acid (VITAMIN C) 100 MG tablet Take 100 mg by mouth daily.      Marland Kitchen  atorvastatin (LIPITOR) 40 MG tablet Take 1 tablet (40 mg total) by mouth daily. 90 tablet 3  . benazepril (LOTENSIN) 20 MG tablet TAKE 1 TABLET EVERY DAY 90 tablet 2  . brimonidine (ALPHAGAN) 0.2 % ophthalmic solution PLACE ONE DROPTO THE LEFT EYE TWICE  DAILY  0  . cholecalciferol (VITAMIN D) 1000 UNITS tablet Take 1,000 Units by mouth daily.    . clopidogrel (PLAVIX) 75 MG tablet Take 1 tablet (75 mg total) by mouth daily. 90 tablet 3  . fish oil-omega-3 fatty acids 1000 MG capsule Take 1,000 mg by mouth daily.     . furosemide (LASIX) 40 MG tablet Take 1 tablet (40 mg total) by mouth daily. 90 tablet 3  . latanoprost (XALATAN) 0.005 % ophthalmic solution Place 0.005 drops into both eyes every evening.    Marland Kitchen levothyroxine (SYNTHROID, LEVOTHROID) 100 MCG tablet Take 1 tablet (100 mcg total) by mouth daily. 90 tablet 3  . metoprolol succinate (TOPROL-XL) 50 MG 24 hr tablet Take 1 tablet (50 mg total) by mouth 2 (two) times daily. Take with or immediately following a meal. 180 tablet 3  . Multiple Vitamin (MULTIVITAMIN) tablet Take 1 tablet by mouth daily.      . nitroGLYCERIN (NITROSTAT) 0.4 MG SL tablet Place 1 tablet (0.4 mg total) under the tongue every 5 (five) minutes as needed for chest pain. 25 tablet 1  . Potassium Chloride ER 20 MEQ TBCR Take 20 mEq by mouth  daily. 90 tablet 3  . spironolactone (ALDACTONE) 25 MG tablet Take 1 tablet (25 mg total) by mouth daily. 30 tablet 12  . vitamin E 100 UNIT capsule Take 100 Units by mouth daily.       No current facility-administered medications on file prior to visit.     Past Medical History:  Diagnosis Date  . CAD (coronary artery disease)    S/P  anterior  wall myocardial infarction in '92, treated w/percutaneous transluminal coronary angioplasty of the left anterior descending. EF 45%  . Carotid artery disease ([place])   . DVT (deep venous thrombosis) ([place])    X1  . History of nuclear stress test    a. Myoview 6/17: EF 20-25%, mid  anteroseptal, apical anterior, apical septal, apical inferior, apical lateral and apical scar, no ischemia, intermediate risk  . HTN (hypertension)   . Hyperlipidemia   . Hypothyroidism   . Ischemic cardiomyopathy    a. Echo 6/17: EF 20-25%, apex appears akinetic, MAC, moderate MR, moderate LAE, mild RVE, trivial PI, PASP 47 mmHg (needs repeat with Definity contrast) //  b. Limited echo with Definity contrast 7/17: EF 25-30%, moderate to severe LAE  . MI (myocardial infarction) 1992   at age 85; TIA treated with coumadin until Plavix initiated in 2006  . PAD (peripheral artery disease) (HCC)    Right SFA occlusion, severe disease left CFA and SFA    Past Surgical History:  Procedure Laterality Date  . APPENDECTOMY     at hysterectomy and USO for fibroids, Dr. Ysidro Evert  . CARDIAC CATHETERIZATION  1992   Dr Eustace Quail  . CARDIAC CATHETERIZATION N/A 09/06/2015   Procedure: Right/Left Heart Cath and Coronary Angiography;  Surgeon: Burnell Blanks, MD;  Location: [place] CV LAB;  Service: Cardiovascular;  Laterality: N/A;  . CARDIAC CATHETERIZATION N/A 09/07/2015   Procedure: Coronary Stent Intervention;  Surgeon: Burnell Blanks, MD;  Location: [place] CV LAB;  Service: Cardiovascular;  Laterality: N/A;  . COLONOSCOPY     negative; 2008, Dr. Delfin Edis  . fracture LLE     '94; pinned  . TONSILLECTOMY    . TOTAL ABDOMINAL HYSTERECTOMY     & BSO for fibroids    Social History   Social History  . Marital status: Married    Spouse name: [name]  . Number of children: 1  . Years of education: college   Occupational History  . Teacher     Retired   Social History Main Topics  . Smoking status: Former Smoker    Quit date: 03/11/1990  . Smokeless tobacco: Never Used     Comment: smoked [place], up to < 5 cigarettes  . Alcohol use No  . Drug use: No  . Sexual activity: Not on file   Other Topics Concern  . 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 08/31/09- designated party form signed appointing, husband [name]; ok to leave msg on home phone [phone].      Caffeine Small Amount one cup very rare.   Right handed.   One child- [name]    Family History  Problem Relation Age of Onset  . Diabetes Mother   . Hypertension Mother   . Stroke Brother     ?> 55  . Coronary artery disease Brother     stent in 30s  . Cancer Neg Hx     Review  of Systems  Constitutional: Negative for fever.  HENT: Positive for rhinorrhea.   Skin: Negative for color change and rash.       Objective:   Vitals:   04/16/16 1359  BP: (!) 150/86  Pulse: 89  Resp: 18  Temp: 98 F (36.7 C)   Filed Weights   04/16/16 1359  Weight: 221 lb (100.2 kg)   Body mass index is 34.61 kg/m.  Wt Readings from Last 3 Encounters:  04/16/16 221 lb (100.2 kg)  02/12/16 225 lb (102.1 kg)  01/10/16 216 lb 4.3 oz (98.1 kg)     Physical Exam  Constitutional: She appears well-developed and well-nourished. No distress.  Skin: Skin is warm and dry. Rash (lower central back - hyperpigmentation, few rough spots and a couple of areas of excoriations) noted. She is not diaphoretic.          Assessment & Plan:   See Problem List for Assessment and Plan of chronic medical problems.

## 2016-04-16 NOTE — Progress Notes (Signed)
Pre visit review using our clinic review tool, if applicable. No additional management support is needed unless otherwise documented below in the visit note. 

## 2016-05-10 ENCOUNTER — Other Ambulatory Visit: Payer: Self-pay | Admitting: Internal Medicine

## 2016-05-10 ENCOUNTER — Other Ambulatory Visit: Payer: Self-pay | Admitting: Cardiovascular Disease

## 2016-06-04 DIAGNOSIS — H25013 Cortical age-related cataract, bilateral: Secondary | ICD-10-CM | POA: Diagnosis not present

## 2016-06-04 DIAGNOSIS — H401123 Primary open-angle glaucoma, left eye, severe stage: Secondary | ICD-10-CM | POA: Diagnosis not present

## 2016-06-04 DIAGNOSIS — H401112 Primary open-angle glaucoma, right eye, moderate stage: Secondary | ICD-10-CM | POA: Diagnosis not present

## 2016-06-04 DIAGNOSIS — H40053 Ocular hypertension, bilateral: Secondary | ICD-10-CM | POA: Diagnosis not present

## 2016-07-02 ENCOUNTER — Telehealth: Payer: Self-pay | Admitting: Internal Medicine

## 2016-07-02 NOTE — Telephone Encounter (Signed)
Pt son called and states his mother is deteriorating and is not very mobile. She has not been taking her fluid pills like she should, and has not been doing her exercises. He is concerned about her and he would like a referral put in for for home health aid.

## 2016-07-03 NOTE — Telephone Encounter (Signed)
Spoke with pts son, pt has an appt already scheduled on 5/1. Extended the appt for 30 mins. Pts son states that she does not feel that she needs home health. He would like [place] to mention it to her and try to advise her that this would be beneficial to her if we feel that it is needed.

## 2016-07-03 NOTE — Telephone Encounter (Signed)
Please advise 

## 2016-07-03 NOTE — Telephone Encounter (Signed)
noted 

## 2016-07-03 NOTE — Telephone Encounter (Signed)
Sounds like she needs to be evaluated - please have her schedule an appointment with me - make extended.  I am not sure what benefits she is eligible for as far has home health aid -- I can refer for a nurse to come out and evaluate her and monitor her meds, etc. But usually they only come out once a week for a few weeks - I would need to see her before I can order that.

## 2016-07-09 ENCOUNTER — Other Ambulatory Visit (INDEPENDENT_AMBULATORY_CARE_PROVIDER_SITE_OTHER): Payer: Medicare HMO

## 2016-07-09 ENCOUNTER — Ambulatory Visit (INDEPENDENT_AMBULATORY_CARE_PROVIDER_SITE_OTHER): Payer: Medicare HMO | Admitting: Internal Medicine

## 2016-07-09 ENCOUNTER — Ambulatory Visit (INDEPENDENT_AMBULATORY_CARE_PROVIDER_SITE_OTHER)
Admission: RE | Admit: 2016-07-09 | Discharge: 2016-07-09 | Disposition: A | Discharge status: Home / Self Care | Payer: Medicare HMO | Source: Ambulatory Visit | Attending: Internal Medicine | Admitting: Internal Medicine

## 2016-07-09 VITALS — BP 172/96 | HR 94 | Temp 97.7°F | Resp 16 | Wt 225.0 lb

## 2016-07-09 DIAGNOSIS — R739 Hyperglycemia, unspecified: Secondary | ICD-10-CM | POA: Insufficient documentation

## 2016-07-09 DIAGNOSIS — R0609 Other forms of dyspnea: Secondary | ICD-10-CM

## 2016-07-09 DIAGNOSIS — I10 Essential (primary) hypertension: Secondary | ICD-10-CM | POA: Diagnosis not present

## 2016-07-09 DIAGNOSIS — I255 Ischemic cardiomyopathy: Secondary | ICD-10-CM

## 2016-07-09 DIAGNOSIS — E038 Other specified hypothyroidism: Secondary | ICD-10-CM | POA: Diagnosis not present

## 2016-07-09 DIAGNOSIS — I2510 Atherosclerotic heart disease of native coronary artery without angina pectoris: Secondary | ICD-10-CM

## 2016-07-09 DIAGNOSIS — I5022 Chronic systolic (congestive) heart failure: Secondary | ICD-10-CM | POA: Diagnosis not present

## 2016-07-09 DIAGNOSIS — E785 Hyperlipidemia, unspecified: Secondary | ICD-10-CM

## 2016-07-09 DIAGNOSIS — R2681 Unsteadiness on feet: Secondary | ICD-10-CM | POA: Diagnosis not present

## 2016-07-09 DIAGNOSIS — R0602 Shortness of breath: Secondary | ICD-10-CM | POA: Diagnosis not present

## 2016-07-09 DIAGNOSIS — R05 Cough: Secondary | ICD-10-CM | POA: Diagnosis not present

## 2016-07-09 DIAGNOSIS — R600 Localized edema: Secondary | ICD-10-CM | POA: Diagnosis not present

## 2016-07-09 LAB — COMPREHENSIVE METABOLIC PANEL
ALT: 19 U/L (ref 0–35)
AST: 21 U/L (ref 0–37)
Albumin: 3.7 g/dL (ref 3.5–5.2)
Alkaline Phosphatase: 105 U/L (ref 39–117)
BUN: 12 mg/dL (ref 6–23)
CO2: 33 mEq/L — ABNORMAL HIGH (ref 19–32)
Calcium: 9.5 mg/dL (ref 8.4–10.5)
Chloride: 103 mEq/L (ref 96–112)
Creatinine, Ser: 0.97 mg/dL (ref 0.40–1.20)
GFR: 71.81 mL/min (ref >60.00)
Glucose, Bld: 111 mg/dL — ABNORMAL HIGH (ref 70–99)
Potassium: 3.9 mEq/L (ref 3.5–5.1)
Sodium: 142 mEq/L (ref 135–145)
Total Bilirubin: 0.8 mg/dL (ref 0.2–1.2)
Total Protein: 7.7 g/dL (ref 6.0–8.3)

## 2016-07-09 LAB — CBC WITH DIFFERENTIAL/PLATELET
Basophils Absolute: 0.1 10*3/uL (ref 0.0–0.1)
Basophils Relative: 0.8 % (ref 0.0–3.0)
Eosinophils Absolute: 0.1 10*3/uL (ref 0.0–0.7)
Eosinophils Relative: 0.8 % (ref 0.0–5.0)
HCT: 38.9 % (ref 36.0–46.0)
Hemoglobin: 12.9 g/dL (ref 12.0–15.0)
Lymphocytes Relative: 21 % (ref 12.0–46.0)
Lymphs Abs: 2.8 10*3/uL (ref 0.7–4.0)
MCHC: 33 g/dL (ref 30.0–36.0)
MCV: 95.6 fl (ref 78.0–100.0)
Monocytes Absolute: 1.2 10*3/uL — ABNORMAL HIGH (ref 0.1–1.0)
Monocytes Relative: 8.6 % (ref 3.0–12.0)
Neutro Abs: 9.2 10*3/uL — ABNORMAL HIGH (ref 1.4–7.7)
Neutrophils Relative %: 68.8 % (ref 43.0–77.0)
Platelets: 382 10*3/uL (ref 150.0–400.0)
RBC: 4.07 Mil/uL (ref 3.87–5.11)
RDW: 13.9 % (ref 11.5–15.5)
WBC: 13.4 10*3/uL — ABNORMAL HIGH (ref 4.0–10.5)

## 2016-07-09 LAB — TSH: TSH: 5.2 u[IU]/mL — ABNORMAL HIGH (ref 0.35–4.50)

## 2016-07-09 LAB — LIPID PANEL
Cholesterol: 132 mg/dL (ref 0–200)
HDL: 31.5 mg/dL — ABNORMAL LOW (ref >39.00)
LDL Cholesterol: 85 mg/dL (ref 0–99)
NonHDL: 100.99
Total CHOL/HDL Ratio: 4
Triglycerides: 78 mg/dL (ref 0.0–149.0)
VLDL: 15.6 mg/dL (ref 0.0–40.0)

## 2016-07-09 LAB — BRAIN NATRIURETIC PEPTIDE: Pro B Natriuretic peptide (BNP): 341 pg/mL — ABNORMAL HIGH (ref 0.0–100.0)

## 2016-07-09 LAB — HEMOGLOBIN A1C: Hgb A1c MFr Bld: 5.8 % (ref 4.6–6.5)

## 2016-07-09 MED ORDER — AMLODIPINE BESYLATE 2.5 MG PO TABS: 2.5000 mg | ORAL_TABLET | Freq: Every day | ORAL | 3 refills | Status: DC

## 2016-07-09 NOTE — Progress Notes (Signed)
Subjective:    Patient ID: Yvonne Bailey, female    DOB: [birth year], 76 y.o.   MRN: [id number]  HPI She is here for follow up.     Her son called about one week ago he said Yvonne Bailey has not been very mobile and is deteriorating.  She has not been taking her fluid pill as prescribed and has not been doing her exercises.  He feels she needs she needs home health.  According to him she did not feel that was necessary.  He came with her today and repeated the above.    Ischemic cardiomyopathy, chronic systolic heart failure, htn:  She is taking her BP medication daily, but often does not take her lasix.  She does not like that it causes increased urination.  She has not taken it the past couple of days.  She does not take it if she is going out somewhere, but often even if she is at home all day does not take it.  It is hard for her to get up so often and go to the bathroom.  She is very sedentary and uses a cane to ambulate.  She gets SOB with exertion per her son - she initially disagrees, but then admits that she does get tired and SOB with ambulating.    She has been having congestion in her chest and blames her sinuses, but denies nasal congestion, sinus pain or cold symptoms.  She does cough and it is sometimes productive of white phlegm.  She has wheezing at times.   She uses a cane to ambulate.  She thinks she walks ok.  Her son disagrees.  She tires easily with exertion.  She is unsteady when she walks.    Medications and allergies reviewed with patient and updated if appropriate.  Patient Active Problem List   Diagnosis Date Noted  . Itching 04/16/2016  . Unstable angina ([place]) 09/06/2015  . Coronary artery disease involving native coronary artery of native heart with unstable angina pectoris ([place])   . Cardiomyopathy, ischemic   . Chronic systolic heart failure ([place]) 08/21/2015  . Bilateral leg edema 07/19/2015  . Atrial fibrillation ([place]) 07/19/2015  . Urinary urgency  03/30/2015  . Other abnormal glucose 05/14/2013  . Other musculoskeletal symptoms referable to limbs(729.89) 12/17/2012  . Lacunar infarction ([place]) 12/17/2012  . Small vessel disease, cerebrovascular 12/17/2012  . CAROTID BRUIT, RIGHT 08/10/2008  . Peripheral vascular disease ([place]) 06/04/2007  . Diverticulosis of large intestine 06/04/2007  . Hypothyroidism 02/24/2007  . Hyperlipidemia 02/24/2007  . Essential hypertension 02/24/2007  . DVT 01/21/2007  . Coronary atherosclerosis 10/29/2006    Current Outpatient Prescriptions on File Prior to Visit  Medication Sig Dispense Refill  . apixaban (ELIQUIS) 5 MG TABS tablet Take 1 tablet (5 mg total) by mouth 2 (two) times daily. 180 tablet 3  . Ascorbic Acid (VITAMIN C) 100 MG tablet Take 100 mg by mouth daily.      Marland Kitchen atorvastatin (LIPITOR) 40 MG tablet Take 1 tablet (40 mg total) by mouth daily. 90 tablet 2  . benazepril (LOTENSIN) 20 MG tablet TAKE 1 TABLET EVERY DAY 90 tablet 1  . brimonidine (ALPHAGAN) 0.2 % ophthalmic solution PLACE ONE DROPTO THE LEFT EYE TWICE  DAILY  0  . cholecalciferol (VITAMIN D) 1000 UNITS tablet Take 1,000 Units by mouth daily.    . clopidogrel (PLAVIX) 75 MG tablet Take 1 tablet (75 mg total) by mouth daily. 90 tablet 3  . clotrimazole-betamethasone (LOTRISONE)  cream Apply 1 application topically 2 (two) times daily. 30 g 0  . fish oil-omega-3 fatty acids 1000 MG capsule Take 1,000 mg by mouth daily.     Marland Kitchen latanoprost (XALATAN) 0.005 % ophthalmic solution Place 0.005 drops into both eyes every evening.    Marland Kitchen levothyroxine (SYNTHROID, LEVOTHROID) 100 MCG tablet Take 1 tablet (100 mcg total) by mouth daily. 90 tablet 3  . metoprolol succinate (TOPROL-XL) 50 MG 24 hr tablet Take 1 tablet (50 mg total) by mouth 2 (two) times daily. Take with or immediately following a meal. 180 tablet 3  . Multiple Vitamin (MULTIVITAMIN) tablet Take 1 tablet by mouth daily.      . nitroGLYCERIN (NITROSTAT) 0.4 MG SL tablet Place 1  tablet (0.4 mg total) under the tongue every 5 (five) minutes as needed for chest pain. 25 tablet 1  . Potassium Chloride ER 20 MEQ TBCR Take 20 mEq by mouth daily. 90 tablet 3  . spironolactone (ALDACTONE) 25 MG tablet Take 1 tablet (25 mg total) by mouth daily. 30 tablet 12  . vitamin E 100 UNIT capsule Take 100 Units by mouth daily.      . furosemide (LASIX) 40 MG tablet Take 1 tablet (40 mg total) by mouth daily. 90 tablet 3   No current facility-administered medications on file prior to visit.     Past Medical History:  Diagnosis Date  . CAD (coronary artery disease)    S/P  anterior  wall myocardial infarction in '92, treated w/percutaneous transluminal coronary angioplasty of the left anterior descending. EF 45%  . Carotid artery disease ([place])   . DVT (deep venous thrombosis) ([place])    X1  . History of nuclear stress test    a. Myoview 6/17: EF 20-25%, mid anteroseptal, apical anterior, apical septal, apical inferior, apical lateral and apical scar, no ischemia, intermediate risk  . HTN (hypertension)   . Hyperlipidemia   . Hypothyroidism   . Ischemic cardiomyopathy    a. Echo 6/17: EF 20-25%, apex appears akinetic, MAC, moderate MR, moderate LAE, mild RVE, trivial PI, PASP 47 mmHg (needs repeat with Definity contrast) //  b. Limited echo with Definity contrast 7/17: EF 25-30%, moderate to severe LAE  . MI (myocardial infarction) 1992   at age 46; TIA treated with coumadin until Plavix initiated in 2006  . PAD (peripheral artery disease) ([facility])    Right SFA occlusion, severe disease left CFA and SFA    Past Surgical History:  Procedure Laterality Date  . APPENDECTOMY     at hysterectomy and USO for fibroids, Dr. Ysidro Evert  . CARDIAC CATHETERIZATION  1992   Dr Eustace Quail  . CARDIAC CATHETERIZATION N/A 09/06/2015   Procedure: Right/Left Heart Cath and Coronary Angiography;  Surgeon: Burnell Blanks, MD;  Location: [facility];  Service: Cardiovascular;  Laterality:  N/A;  . CARDIAC CATHETERIZATION N/A 09/07/2015   Procedure: Coronary Stent Intervention;  Surgeon: Burnell Blanks, MD;  Location: [facility];  Service: Cardiovascular;  Laterality: N/A;  . COLONOSCOPY     negative; 2008, Dr. Delfin Edis  . fracture LLE     '94; pinned  . TONSILLECTOMY    . TOTAL ABDOMINAL HYSTERECTOMY     & BSO for fibroids    Social History   Social History  . Marital status: Married    Spouse name: [name]  . Number of children: 1  . Years of education: college   Occupational History  . Teacher  Retired   Social History Main Topics  . Smoking status: Former Smoker    Quit date: 03/11/1990  . Smokeless tobacco: Never Used     Comment: smoked [place], up to < 5 cigarettes  . Alcohol use No  . Drug use: No  . Sexual activity: Not on file   Other Topics Concern  . 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 08/31/09- designated party form signed appointing, husband Freida Nebel; ok to leave msg on home phone [phone].      Caffeine Small Amount one cup very rare.   Right handed.   One child- [name]    Family History  Problem Relation Age of Onset  . Diabetes Mother   . Hypertension Mother   . Stroke Brother     ?> 55  . Coronary artery disease Brother     stent in 45s  . Cancer Neg Hx     Review of Systems  Constitutional: Positive for appetite change (poor). Negative for fever.  HENT: Negative for congestion, ear pain, sinus pain, sinus pressure and sore throat.   Respiratory: Positive for cough (sometime prodcutive), shortness of breath (with exertion) and wheezing.   Cardiovascular: Positive for chest pain (sometimes with coughing) and leg swelling. Negative for palpitations.  Gastrointestinal: Negative for abdominal pain.  Neurological: Negative for light-headedness and headaches.       Objective:   Vitals:   07/09/16 1336  BP: (!)  196/104  Pulse: 94  Resp: 16  Temp: 97.7 F (36.5 C)   Filed Weights   07/09/16 1336  Weight: 225 lb (102.1 kg)   Body mass index is 35.24 kg/m.  Wt Readings from Last 3 Encounters:  07/09/16 225 lb (102.1 kg)  04/16/16 221 lb (100.2 kg)  02/12/16 225 lb (102.1 kg)     Physical Exam Constitutional: Appears well-developed and well-nourished. No distress.  HENT:  Head: Normocephalic and atraumatic.  Neck: Neck supple. No tracheal deviation present. No thyromegaly present.  No cervical lymphadenopathy Cardiovascular: Normal rate, regular rhythm and normal heart sounds.   No murmur heard. No carotid bruit .  2+ pitting edema Pulmonary/Chest: Effort normal and breath sounds normal. No respiratory distress. No has no wheezes. No rales. Abdomen: soft, non tender  Skin: Skin is warm and dry. Not diaphoretic.  Psychiatric: Normal mood and affect. Behavior is normal.         Assessment & Plan:   See Problem List for Assessment and Plan of chronic medical problems.

## 2016-07-09 NOTE — Patient Instructions (Addendum)
Blood work and chest xray was ordered  Test(s) ordered today. Your results will be released to MyChart (or called to you) after review, usually within 72hours after test completion. If any changes need to be made, you will be notified at that same time.   Medications reviewed and updated.  Start amlodipine 2.5 mg daily.  Take all your medications daily as prescribed.    A referral was ordered for home health  Please followup in 2 weeks

## 2016-07-09 NOTE — Progress Notes (Signed)
Pre visit review using our clinic review tool, if applicable. No additional management support is needed unless otherwise documented below in the visit note. 

## 2016-07-10 ENCOUNTER — Encounter: Payer: Self-pay | Admitting: Internal Medicine

## 2016-07-10 NOTE — Assessment & Plan Note (Signed)
Uses cane Sedentary and deconditioned h/o of CVA which is likely contributing Needs PT - will order for home PT Hopefully improving her strength will make it easier for her to get up and go to the bathroom and may improve compliance with lasix

## 2016-07-10 NOTE — Assessment & Plan Note (Signed)
BP very high today She is taking her meds, except the lasix - stressed importance of taking lasix daily Start amlodipine 2.5 mg daily - typically her BP is better controlled Will have home health nurse help monitor Follow up with me in one week

## 2016-07-10 NOTE — Assessment & Plan Note (Signed)
Check lipid panel  Continue daily statin  

## 2016-07-10 NOTE — Assessment & Plan Note (Signed)
Stressed taking lasix and all her other medications daily Has cardiology follow up No active angina

## 2016-07-10 NOTE — Assessment & Plan Note (Signed)
Has intermittent chest pain when she coughs only - not indicative of cardiac ischemia Has follow up with cardiology

## 2016-07-10 NOTE — Assessment & Plan Note (Signed)
a1c

## 2016-07-10 NOTE — Assessment & Plan Note (Signed)
Appears fluid overloaded - ? Not severe Will check CXR given chest congestion Will check cmp, bnp Stressed lasix daily Advised to monitor weights which she is not doing Will have home health nurse some help with disease management

## 2016-07-10 NOTE — Assessment & Plan Note (Signed)
Check tsh  Titrate med dose if needed  

## 2016-07-10 NOTE — Assessment & Plan Note (Signed)
CHF may be contributing but deconditioning, being sedentary and obesity is also contributing bnp, cxr today Will start home PT

## 2016-07-10 NOTE — Assessment & Plan Note (Signed)
Not taking lasix many days a week - stressed the importance of taking it daily Has leg edema and appears fluid overloaded - restart lasix 40 mg daily Check labs - cmp, bnp Elevate legs when sitting Low sodium diet

## 2016-07-17 ENCOUNTER — Telehealth: Payer: Self-pay | Admitting: Internal Medicine

## 2016-07-17 NOTE — Telephone Encounter (Signed)
Spoke with Corrie Dandy, [facility]. Referral has been sent to [facility], they will contact pt. Referral has been sent to [facility]. Spoke with pts son to give information.

## 2016-07-17 NOTE — Telephone Encounter (Signed)
Son called stating patient has not heard anything from home health care.  Can you please follow up in regard?

## 2016-07-18 ENCOUNTER — Other Ambulatory Visit: Payer: Self-pay | Admitting: Emergency Medicine

## 2016-07-18 MED ORDER — LEVOTHYROXINE SODIUM 112 MCG PO TABS: 112.0000 ug | ORAL_TABLET | Freq: Every day | ORAL | 1 refills | Status: DC

## 2016-07-19 ENCOUNTER — Telehealth: Payer: Self-pay | Admitting: Internal Medicine

## 2016-07-19 NOTE — Telephone Encounter (Signed)
Verbal orders okay per MD

## 2016-07-19 NOTE — Telephone Encounter (Signed)
[phone] [facility] home [facility] called and wanted a verbal to start next week. States patient requested this.   Verbal ordered OKAY by Ladona Ridgel.

## 2016-07-22 DIAGNOSIS — I509 Heart failure, unspecified: Secondary | ICD-10-CM | POA: Diagnosis not present

## 2016-07-22 DIAGNOSIS — M6281 Muscle weakness (generalized): Secondary | ICD-10-CM | POA: Diagnosis not present

## 2016-07-22 DIAGNOSIS — I5020 Unspecified systolic (congestive) heart failure: Secondary | ICD-10-CM | POA: Diagnosis not present

## 2016-07-22 DIAGNOSIS — I10 Essential (primary) hypertension: Secondary | ICD-10-CM | POA: Diagnosis not present

## 2016-07-22 DIAGNOSIS — R600 Localized edema: Secondary | ICD-10-CM | POA: Diagnosis not present

## 2016-07-25 DIAGNOSIS — I10 Essential (primary) hypertension: Secondary | ICD-10-CM | POA: Diagnosis not present

## 2016-07-25 DIAGNOSIS — I509 Heart failure, unspecified: Secondary | ICD-10-CM | POA: Diagnosis not present

## 2016-07-25 DIAGNOSIS — R600 Localized edema: Secondary | ICD-10-CM | POA: Diagnosis not present

## 2016-07-25 DIAGNOSIS — M6281 Muscle weakness (generalized): Secondary | ICD-10-CM | POA: Diagnosis not present

## 2016-07-25 DIAGNOSIS — I5020 Unspecified systolic (congestive) heart failure: Secondary | ICD-10-CM | POA: Diagnosis not present

## 2016-07-26 ENCOUNTER — Telehealth: Payer: Self-pay | Admitting: Internal Medicine

## 2016-07-26 DIAGNOSIS — I5020 Unspecified systolic (congestive) heart failure: Secondary | ICD-10-CM | POA: Diagnosis not present

## 2016-07-26 DIAGNOSIS — M6281 Muscle weakness (generalized): Secondary | ICD-10-CM | POA: Diagnosis not present

## 2016-07-26 DIAGNOSIS — I10 Essential (primary) hypertension: Secondary | ICD-10-CM | POA: Diagnosis not present

## 2016-07-26 DIAGNOSIS — I509 Heart failure, unspecified: Secondary | ICD-10-CM | POA: Diagnosis not present

## 2016-07-26 DIAGNOSIS — R600 Localized edema: Secondary | ICD-10-CM | POA: Diagnosis not present

## 2016-07-26 NOTE — Telephone Encounter (Signed)
Needs verbals for physical therapy  2x3 Staring next week   Needs verbals for occupational therapy for an evaluation

## 2016-07-28 DIAGNOSIS — R7303 Prediabetes: Secondary | ICD-10-CM

## 2016-07-28 HISTORY — DX: Prediabetes: R73.03

## 2016-07-28 NOTE — Patient Instructions (Addendum)
  Medications reviewed and updated.  No changes recommended at this time.     Please followup in 6 months   

## 2016-07-28 NOTE — Progress Notes (Signed)
Subjective:    Patient ID: Yvonne Bailey, female    DOB: Dec 01, 1940, 76 y.o.   MRN: [id number]  HPI The patient is here for follow up.  Hypertension: She is taking her medication daily. She is compliant with a low sodium diet.  She denies chest pain, palpitations, edema, shortness of breath and regular headaches. She is exercising regularly.  She does not monitor her blood pressure at home.    Leg edema,CHF:  She was fluid overloaded at her last visit due to not taking her lasix daily.  She is taking her lasix daily as prescribed - she has been compliant.   DOE:   Her breathing has improved since starting lasix daily.  She denies shortness of breath.   Gait instability:  Home PT has been ordered.  She should be starting this week.    Hypothyroidism:  We increased her dose three weeks ago.  She is taking her medication daily.    Prediabetic:  Her a1c three weeks ago was 5.8%.    Medications and allergies reviewed with patient and updated if appropriate.  Patient Active Problem List   Diagnosis Date Noted  . Prediabetes 07/28/2016  . DOE (dyspnea on exertion) 07/09/2016  . Gait instability 07/09/2016  . Itching 04/16/2016  . Unstable angina ([place]) 09/06/2015  . Coronary artery disease involving native coronary artery of native heart with unstable angina pectoris ([place])   . Cardiomyopathy, ischemic   . Chronic systolic heart failure ([place]) 08/21/2015  . Bilateral leg edema 07/19/2015  . Atrial fibrillation ([place]) 07/19/2015  . Urinary urgency 03/30/2015  . Other abnormal glucose 05/14/2013  . Other musculoskeletal symptoms referable to limbs(729.89) 12/17/2012  . Lacunar infarction ([place]) 12/17/2012  . Small vessel disease, cerebrovascular 12/17/2012  . CAROTID BRUIT, RIGHT 08/10/2008  . Peripheral vascular disease ([place]) 06/04/2007  . Diverticulosis of large intestine 06/04/2007  . Hypothyroidism 02/24/2007  . Hyperlipidemia 02/24/2007  . Essential hypertension 02/24/2007    . DVT 01/21/2007  . Coronary atherosclerosis 10/29/2006    Current Outpatient Prescriptions on File Prior to Visit  Medication Sig Dispense Refill  . amLODipine (NORVASC) 2.5 MG tablet Take 1 tablet (2.5 mg total) by mouth daily. 90 tablet 3  . apixaban (ELIQUIS) 5 MG TABS tablet Take 1 tablet (5 mg total) by mouth 2 (two) times daily. 180 tablet 3  . atorvastatin (LIPITOR) 40 MG tablet Take 1 tablet (40 mg total) by mouth daily. 90 tablet 2  . benazepril (LOTENSIN) 20 MG tablet TAKE 1 TABLET EVERY DAY 90 tablet 1  . brimonidine (ALPHAGAN) 0.2 % ophthalmic solution PLACE ONE DROPTO THE LEFT EYE TWICE  DAILY  0  . cholecalciferol (VITAMIN D) 1000 UNITS tablet Take 1,000 Units by mouth daily.    . clopidogrel (PLAVIX) 75 MG tablet Take 1 tablet (75 mg total) by mouth daily. 90 tablet 3  . clotrimazole-betamethasone (LOTRISONE) cream Apply 1 application topically 2 (two) times daily. 30 g 0  . fish oil-omega-3 fatty acids 1000 MG capsule Take 1,000 mg by mouth daily.     . furosemide (LASIX) 40 MG tablet Take 1 tablet (40 mg total) by mouth daily. 90 tablet 3  . latanoprost (XALATAN) 0.005 % ophthalmic solution Place 0.005 drops into both eyes every evening.    Marland Kitchen levothyroxine (SYNTHROID, LEVOTHROID) 112 MCG tablet Take 1 tablet (112 mcg total) by mouth daily. 90 tablet 1  . metoprolol succinate (TOPROL-XL) 50 MG 24 hr tablet Take 1 tablet (50 mg total) by  mouth 2 (two) times daily. Take with or immediately following a meal. 180 tablet 3  . Multiple Vitamin (MULTIVITAMIN) tablet Take 1 tablet by mouth daily.      . nitroGLYCERIN (NITROSTAT) 0.4 MG SL tablet Place 1 tablet (0.4 mg total) under the tongue every 5 (five) minutes as needed for chest pain. 25 tablet 1  . Potassium Chloride ER 20 MEQ TBCR Take 20 mEq by mouth daily. 90 tablet 3  . spironolactone (ALDACTONE) 25 MG tablet Take 1 tablet (25 mg total) by mouth daily. 30 tablet 12   No current facility-administered medications on file  prior to visit.     Past Medical History:  Diagnosis Date  . CAD (coronary artery disease)    S/P  anterior  wall myocardial infarction in '92, treated w/percutaneous transluminal coronary angioplasty of the left anterior descending. EF 45%  . Carotid artery disease ([place])   . DVT (deep venous thrombosis) ([place])    X1  . History of nuclear stress test    a. Myoview 6/17: EF 20-25%, mid anteroseptal, apical anterior, apical septal, apical inferior, apical lateral and apical scar, no ischemia, intermediate risk  . HTN (hypertension)   . Hyperlipidemia   . Hypothyroidism   . Ischemic cardiomyopathy    a. Echo 6/17: EF 20-25%, apex appears akinetic, MAC, moderate MR, moderate LAE, mild RVE, trivial PI, PASP 47 mmHg (needs repeat with Definity contrast) //  b. Limited echo with Definity contrast 7/17: EF 25-30%, moderate to severe LAE  . MI (myocardial infarction) ([place]) 1992   at age 26; TIA treated with coumadin until Plavix initiated in 2006  . PAD (peripheral artery disease) ([facility])    Right SFA occlusion, severe disease left CFA and SFA    Past Surgical History:  Procedure Laterality Date  . APPENDECTOMY     at hysterectomy and USO for fibroids, Dr. Ysidro Evert  . CARDIAC CATHETERIZATION  1992   Dr Eustace Quail  . CARDIAC CATHETERIZATION N/A 09/06/2015   Procedure: Right/Left Heart Cath and Coronary Angiography;  Surgeon: Burnell Blanks, MD;  Location: [facility];  Service: Cardiovascular;  Laterality: N/A;  . CARDIAC CATHETERIZATION N/A 09/07/2015   Procedure: Coronary Stent Intervention;  Surgeon: Burnell Blanks, MD;  Location: [facility];  Service: Cardiovascular;  Laterality: N/A;  . COLONOSCOPY     negative; 2008, Dr. Delfin Edis  . fracture LLE     '94; pinned  . TONSILLECTOMY    . TOTAL ABDOMINAL HYSTERECTOMY     & BSO for fibroids    Social History   Social History  . Marital status: Married    Spouse name: [name]  . Number of children: 1  .  Years of education: college   Occupational History  . Teacher     Retired   Social History Main Topics  . Smoking status: Former Smoker    Quit date: 03/11/1990  . Smokeless tobacco: Never Used     Comment: smoked [place], up to < 5 cigarettes  . Alcohol use No  . Drug use: No  . Sexual activity: Not on file   Other Topics Concern  . 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 08/31/09- designated party form signed appointing, husband Suhani Stillion; ok to leave msg on home phone [phone].      Caffeine Small Amount one cup very rare.   Right handed.   One  child- [name]    Family History  Problem Relation Age of Onset  . Diabetes Mother   . Hypertension Mother   . Stroke Brother        ?> 55  . Coronary artery disease Brother        stent in 42s  . Cancer Neg Hx     Review of Systems  Constitutional: Negative for chills and fever.  Respiratory: Negative for cough, shortness of breath and wheezing.   Cardiovascular: Positive for leg swelling (improved). Negative for chest pain and palpitations.  Neurological: Negative for light-headedness and headaches.       Objective:   Vitals:   07/29/16 1350 07/29/16 1357  BP: (!) 146/98 (!) 134/96  Pulse: 81   Resp: 18   Temp: 97.4 F (36.3 C)    Wt Readings from Last 3 Encounters:  07/29/16 221 lb (100.2 kg)  07/09/16 225 lb (102.1 kg)  04/16/16 221 lb (100.2 kg)   Body mass index is 34.61 kg/m.   Physical Exam    Constitutional: Appears well-developed and well-nourished. No distress.  HENT:  Head: Normocephalic and atraumatic.  Neck: Neck supple. No tracheal deviation present. No thyromegaly present.  No cervical lymphadenopathy Cardiovascular: Normal rate, regular rhythm and normal heart sounds.   No murmur heard. No carotid bruit .  1 + mildly pitting b/l LE edema Pulmonary/Chest: Effort normal and breath sounds normal. No  respiratory distress. No has no wheezes. No rales.  Skin: Skin is warm and dry. Not diaphoretic.  Psychiatric: Normal mood and affect. Behavior is normal.      Assessment & Plan:    See Problem List for Assessment and Plan of chronic medical problems.

## 2016-07-28 NOTE — Assessment & Plan Note (Signed)
a1c three weeks ago : 5.8% Advised low sugar/carb diet Increase activity Ideally work on weight loss

## 2016-07-29 ENCOUNTER — Encounter: Payer: Self-pay | Admitting: Internal Medicine

## 2016-07-29 ENCOUNTER — Ambulatory Visit (INDEPENDENT_AMBULATORY_CARE_PROVIDER_SITE_OTHER): Payer: Medicare HMO | Admitting: Internal Medicine

## 2016-07-29 VITALS — BP 134/96 | HR 81 | Temp 97.4°F | Resp 18 | Wt 221.0 lb

## 2016-07-29 DIAGNOSIS — R2681 Unsteadiness on feet: Secondary | ICD-10-CM

## 2016-07-29 DIAGNOSIS — R7303 Prediabetes: Secondary | ICD-10-CM | POA: Diagnosis not present

## 2016-07-29 DIAGNOSIS — E038 Other specified hypothyroidism: Secondary | ICD-10-CM

## 2016-07-29 DIAGNOSIS — R0609 Other forms of dyspnea: Secondary | ICD-10-CM

## 2016-07-29 DIAGNOSIS — I5022 Chronic systolic (congestive) heart failure: Secondary | ICD-10-CM | POA: Diagnosis not present

## 2016-07-29 DIAGNOSIS — R600 Localized edema: Secondary | ICD-10-CM | POA: Diagnosis not present

## 2016-07-29 DIAGNOSIS — I10 Essential (primary) hypertension: Secondary | ICD-10-CM

## 2016-07-29 NOTE — Assessment & Plan Note (Signed)
Resolved with taking lasix daily

## 2016-07-29 NOTE — Assessment & Plan Note (Addendum)
BP Readings from Last 3 Encounters:  07/29/16 (!) 134/96  07/09/16 (!) 172/96  04/16/16 (!) 150/86   Has been lower at home  - will have home health nurse help monitor Will hold off on changes today Start regular exercise Work on weight loss

## 2016-07-29 NOTE — Assessment & Plan Note (Signed)
Continue current medication dose

## 2016-07-29 NOTE — Telephone Encounter (Signed)
Spoke with Onalee Hua to give verbal orders per MD

## 2016-07-29 NOTE — Assessment & Plan Note (Signed)
Doing home PT [facility] St[facility] regular exercise

## 2016-07-29 NOTE — Assessment & Plan Note (Signed)
Volume status improved Stressed lasix on a daily basis Limit sodium intake Regular exercise Work on weight loss

## 2016-07-29 NOTE — Assessment & Plan Note (Signed)
Improved with lasix daily Continue daily medication

## 2016-07-30 DIAGNOSIS — R600 Localized edema: Secondary | ICD-10-CM | POA: Diagnosis not present

## 2016-07-30 DIAGNOSIS — I10 Essential (primary) hypertension: Secondary | ICD-10-CM | POA: Diagnosis not present

## 2016-07-30 DIAGNOSIS — I5020 Unspecified systolic (congestive) heart failure: Secondary | ICD-10-CM | POA: Diagnosis not present

## 2016-07-30 DIAGNOSIS — H401133 Primary open-angle glaucoma, bilateral, severe stage: Secondary | ICD-10-CM | POA: Diagnosis not present

## 2016-07-30 DIAGNOSIS — M6281 Muscle weakness (generalized): Secondary | ICD-10-CM | POA: Diagnosis not present

## 2016-07-30 DIAGNOSIS — H40053 Ocular hypertension, bilateral: Secondary | ICD-10-CM | POA: Diagnosis not present

## 2016-07-30 DIAGNOSIS — I509 Heart failure, unspecified: Secondary | ICD-10-CM | POA: Diagnosis not present

## 2016-08-02 DIAGNOSIS — I5020 Unspecified systolic (congestive) heart failure: Secondary | ICD-10-CM | POA: Diagnosis not present

## 2016-08-02 DIAGNOSIS — I509 Heart failure, unspecified: Secondary | ICD-10-CM | POA: Diagnosis not present

## 2016-08-02 DIAGNOSIS — I10 Essential (primary) hypertension: Secondary | ICD-10-CM | POA: Diagnosis not present

## 2016-08-02 DIAGNOSIS — M6281 Muscle weakness (generalized): Secondary | ICD-10-CM | POA: Diagnosis not present

## 2016-08-02 DIAGNOSIS — R600 Localized edema: Secondary | ICD-10-CM | POA: Diagnosis not present

## 2016-08-06 DIAGNOSIS — I5020 Unspecified systolic (congestive) heart failure: Secondary | ICD-10-CM | POA: Diagnosis not present

## 2016-08-06 DIAGNOSIS — R600 Localized edema: Secondary | ICD-10-CM | POA: Diagnosis not present

## 2016-08-06 DIAGNOSIS — I509 Heart failure, unspecified: Secondary | ICD-10-CM | POA: Diagnosis not present

## 2016-08-06 DIAGNOSIS — M6281 Muscle weakness (generalized): Secondary | ICD-10-CM | POA: Diagnosis not present

## 2016-08-06 DIAGNOSIS — I10 Essential (primary) hypertension: Secondary | ICD-10-CM | POA: Diagnosis not present

## 2016-08-07 DIAGNOSIS — M6281 Muscle weakness (generalized): Secondary | ICD-10-CM | POA: Diagnosis not present

## 2016-08-07 DIAGNOSIS — I5020 Unspecified systolic (congestive) heart failure: Secondary | ICD-10-CM | POA: Diagnosis not present

## 2016-08-07 DIAGNOSIS — I509 Heart failure, unspecified: Secondary | ICD-10-CM | POA: Diagnosis not present

## 2016-08-07 DIAGNOSIS — I10 Essential (primary) hypertension: Secondary | ICD-10-CM | POA: Diagnosis not present

## 2016-08-07 DIAGNOSIS — R600 Localized edema: Secondary | ICD-10-CM | POA: Diagnosis not present

## 2016-08-08 ENCOUNTER — Other Ambulatory Visit: Payer: Self-pay | Admitting: Cardiovascular Disease

## 2016-08-08 ENCOUNTER — Telehealth: Payer: Self-pay | Admitting: Internal Medicine

## 2016-08-08 ENCOUNTER — Other Ambulatory Visit: Payer: Self-pay

## 2016-08-08 DIAGNOSIS — I5020 Unspecified systolic (congestive) heart failure: Secondary | ICD-10-CM | POA: Diagnosis not present

## 2016-08-08 DIAGNOSIS — R600 Localized edema: Secondary | ICD-10-CM | POA: Diagnosis not present

## 2016-08-08 DIAGNOSIS — M6281 Muscle weakness (generalized): Secondary | ICD-10-CM | POA: Diagnosis not present

## 2016-08-08 DIAGNOSIS — I5022 Chronic systolic (congestive) heart failure: Secondary | ICD-10-CM

## 2016-08-08 DIAGNOSIS — I509 Heart failure, unspecified: Secondary | ICD-10-CM | POA: Diagnosis not present

## 2016-08-08 DIAGNOSIS — I10 Essential (primary) hypertension: Secondary | ICD-10-CM | POA: Diagnosis not present

## 2016-08-08 MED ORDER — POTASSIUM CHLORIDE ER 20 MEQ PO TBCR: 20.0000 meq | EXTENDED_RELEASE_TABLET | Freq: Every day | ORAL | 1 refills | Status: DC

## 2016-08-08 NOTE — Telephone Encounter (Signed)
Spoke with Fannie Knee to give verbal orders per MD

## 2016-08-08 NOTE — Telephone Encounter (Signed)
Wants to continue skilled nursing for twice a week for three weeks b/c thinks patient could benefit from this due to edema.  Monitoring for congestive heart failure. Was suppose to discharge patient today.  Requests call back in regard.

## 2016-08-12 ENCOUNTER — Ambulatory Visit (INDEPENDENT_AMBULATORY_CARE_PROVIDER_SITE_OTHER): Payer: Medicare HMO | Admitting: Cardiovascular Disease

## 2016-08-12 ENCOUNTER — Encounter: Payer: Self-pay | Admitting: Cardiovascular Disease

## 2016-08-12 VITALS — BP 138/80 | HR 81 | Ht 66.0 in | Wt 222.0 lb

## 2016-08-12 DIAGNOSIS — I255 Ischemic cardiomyopathy: Secondary | ICD-10-CM | POA: Diagnosis not present

## 2016-08-12 DIAGNOSIS — I2510 Atherosclerotic heart disease of native coronary artery without angina pectoris: Secondary | ICD-10-CM

## 2016-08-12 DIAGNOSIS — I10 Essential (primary) hypertension: Secondary | ICD-10-CM | POA: Diagnosis not present

## 2016-08-12 DIAGNOSIS — I481 Persistent atrial fibrillation: Secondary | ICD-10-CM

## 2016-08-12 DIAGNOSIS — I4819 Other persistent atrial fibrillation: Secondary | ICD-10-CM

## 2016-08-12 DIAGNOSIS — I5022 Chronic systolic (congestive) heart failure: Secondary | ICD-10-CM

## 2016-08-12 NOTE — Progress Notes (Signed)
Chief Complaint  Patient presents with  . Follow-up    CAD   History of Present Illness: 76 yo female with history of CAD, HTN, PAD, carotid artery disease, hyperlipidemia and paroxysmal atrial fibrillation here today for cardiac followup. In 1992 she had an anterior MI treated with PCI. Her ejection fraction in 2007 was 45%. She also has mild carotid disease by carotid dopplers 2017. She has LE arterial disease and is known to have occlusion of the right SFA and severe stenosis left CFA and SFA. ABI January 2017 with 0.65 on right and 0.72 on the left. She is on Eliquis for PAF. Echo June 2017 with fall in LVEF. Cath June 2017 with severe three vessel CAD. She was turned down for CABG. I performed PCI with placement of drug eluting stents in the proximal and mid RCA, proximal Circumflex, OM2, Diagonal 1 in June 2017. Echo 02/08/16 with LVEF=40-45%, moderate TR, trivial MR.   She is here today for follow up. The patient denies any chest pain, dyspnea, palpitations, lower extremity edema, orthopnea, PND, dizziness, near syncope or syncope. No leg pain.    Primary Care Physician: Binnie Rail, MD   Past Medical History:  Diagnosis Date  . CAD (coronary artery disease)    S/P  anterior  wall myocardial infarction in '92, treated w/percutaneous transluminal coronary angioplasty of the left anterior descending. EF 45%  . Carotid artery disease ([place])   . DVT (deep venous thrombosis) ([place])    X1  . History of nuclear stress test    a. Myoview 6/17: EF 20-25%, mid anteroseptal, apical anterior, apical septal, apical inferior, apical lateral and apical scar, no ischemia, intermediate risk  . HTN (hypertension)   . Hyperlipidemia   . Hypothyroidism   . Ischemic cardiomyopathy    a. Echo 6/17: EF 20-25%, apex appears akinetic, MAC, moderate MR, moderate LAE, mild RVE, trivial PI, PASP 47 mmHg (needs repeat with Definity contrast) //  b. Limited echo with Definity contrast 7/17: EF 25-30%,  moderate to severe LAE  . MI (myocardial infarction) ([place]) 1992   at age 38; TIA treated with coumadin until Plavix initiated in 2006  . PAD (peripheral artery disease) ([facility])    Right SFA occlusion, severe disease left CFA and SFA    Past Surgical History:  Procedure Laterality Date  . APPENDECTOMY     at hysterectomy and USO for fibroids, Dr. Ysidro Evert  . CARDIAC CATHETERIZATION  1992   Dr Eustace Quail  . CARDIAC CATHETERIZATION N/A 09/06/2015   Procedure: Right/Left Heart Cath and Coronary Angiography;  Surgeon: Burnell Blanks, MD;  Location: [facility];  Service: Cardiovascular;  Laterality: N/A;  . CARDIAC CATHETERIZATION N/A 09/07/2015   Procedure: Coronary Stent Intervention;  Surgeon: Burnell Blanks, MD;  Location: [facility];  Service: Cardiovascular;  Laterality: N/A;  . COLONOSCOPY     negative; 2008, Dr. Delfin Edis  . fracture LLE     '94; pinned  . TONSILLECTOMY    . TOTAL ABDOMINAL HYSTERECTOMY     & BSO for fibroids    Current Outpatient Prescriptions  Medication Sig Dispense Refill  . amLODipine (NORVASC) 2.5 MG tablet Take 1 tablet (2.5 mg total) by mouth daily. 90 tablet 3  . apixaban (ELIQUIS) 5 MG TABS tablet Take 1 tablet (5 mg total) by mouth 2 (two) times daily. 180 tablet 3  . atorvastatin (LIPITOR) 40 MG tablet Take 1 tablet (40 mg total) by mouth daily. [place]  tablet 2  . benazepril (LOTENSIN) 20 MG tablet TAKE 1 TABLET EVERY DAY 90 tablet 1  . brimonidine (ALPHAGAN) 0.2 % ophthalmic solution PLACE ONE DROPTO THE LEFT EYE TWICE  DAILY  0  . cholecalciferol (VITAMIN D) 1000 UNITS tablet Take 1,000 Units by mouth daily.    . clopidogrel (PLAVIX) 75 MG tablet Take 1 tablet (75 mg total) by mouth daily. 90 tablet 3  . fish oil-omega-3 fatty acids 1000 MG capsule Take 1,000 mg by mouth daily.     Marland Kitchen latanoprost (XALATAN) 0.005 % ophthalmic solution Place 0.005 drops into both eyes every evening.    Marland Kitchen levothyroxine (SYNTHROID, LEVOTHROID)  112 MCG tablet Take 1 tablet (112 mcg total) by mouth daily. 90 tablet 1  . metoprolol succinate (TOPROL-XL) 50 MG 24 hr tablet Take 1 tablet (50 mg total) by mouth 2 (two) times daily. Take with or immediately following a meal. 180 tablet 3  . Multiple Vitamin (MULTIVITAMIN) tablet Take 1 tablet by mouth daily.      . nitroGLYCERIN (NITROSTAT) 0.4 MG SL tablet Place 1 tablet (0.4 mg total) under the tongue every 5 (five) minutes as needed for chest pain. 25 tablet 1  . Potassium Chloride ER 20 MEQ TBCR Take 20 mEq by mouth daily. 30 tablet 1  . spironolactone (ALDACTONE) 25 MG tablet Take 1 tablet (25 mg total) by mouth daily. 30 tablet 12  . furosemide (LASIX) 40 MG tablet Take 1 tablet (40 mg total) by mouth daily. 90 tablet 3   No current facility-administered medications for this visit.     Allergies  Allergen Reactions  . Definity [Perflutren Lipid Microsphere] Other (See Comments)    Patient experienced back pain with injection  . Cilostazol Other (See Comments)     Pletal :" head felt funny"    Social History   Social History  . Marital status: Married    Spouse name: [name]  . Number of children: 1  . Years of education: college   Occupational History  . Teacher     Retired   Social History Main Topics  . Smoking status: Former Smoker    Quit date: 03/11/1990  . Smokeless tobacco: Never Used     Comment: smoked Everly, up to < 5 cigarettes  . Alcohol use No  . Drug use: No  . Sexual activity: Not on file   Other Topics Concern  . 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 08/31/09- designated party form signed appointing, husband Julieanne Hadsall; ok to leave msg on home phone [phone].      Caffeine Small Amount one cup very rare.   Right handed.   One child- [name]    Family History  Problem Relation Age of Onset  . Diabetes Mother   . Hypertension Mother   . Stroke Brother         ?> 55  . Coronary artery disease Brother        stent in 68s  . Cancer Neg Hx     Review of Systems:  As stated in the HPI and otherwise negative.   BP 138/80   Pulse 81   Ht 5' 6"  (1.676 m)   Wt 222 lb (100.7 kg)   SpO2 97%   BMI 35.83 kg/m   Physical Examination:  General: Well developed, well nourished, NAD  HEENT: OP clear, mucus membranes moist  SKIN: warm, dry.  No rashes. Neuro: No focal deficits  Musculoskeletal: Muscle strength 5/5 all ext  Psychiatric: Mood and affect normal  Neck: No JVD, no carotid bruits, no thyromegaly, no lymphadenopathy.  Lungs:Clear bilaterally, no wheezes, rhonci, crackles Cardiovascular: Regular rate and rhythm. No murmurs, gallops or rubs. Abdomen:Soft. Bowel sounds present. Non-tender.  Extremities: No lower extremity edema. Pulses are 2 + in the bilateral DP/PT.  Echo 02/08/16: Left ventricle: The cavity size was normal. Wall thickness was   normal. Systolic function was mildly to moderately reduced. The   estimated ejection fraction was in the range of 40% to 45%. - Left atrium: The atrium was moderately dilated. - Tricuspid valve: There was moderate regurgitation. - Pulmonary arteries: PA peak pressure: 47 mm Hg (S).  EKG:  EKG is ordered today. The ekg ordered today demonstrates Atrial fib, rate 71 bpm. LAFB. Poor R wave progression. Unchanged.   Recent Labs: 07/09/2016: ALT 19; BUN 12; Creatinine, Ser 0.97; Hemoglobin 12.9; Platelets 382.0; Potassium 3.9; Pro B Natriuretic peptide (BNP) 341.0; Sodium 142; TSH 5.20   Lipid Panel    Component Value Date/Time   CHOL 132 07/09/2016 1441   TRIG 78.0 07/09/2016 1441   HDL 31.50 (L) 07/09/2016 1441   CHOLHDL 4 07/09/2016 1441   VLDL 15.6 07/09/2016 1441   LDLCALC 85 07/09/2016 1441     Wt Readings from Last 3 Encounters:  08/12/16 222 lb (100.7 kg)  07/29/16 221 lb (100.2 kg)  07/09/16 225 lb (102.1 kg)     Other studies Reviewed: Additional studies/ records that were  reviewed today include: . Review of the above records demonstrates:   Assessment and Plan:   1. Chronic systolic CHF: Weight stable. Volume status is ok. Continue Lasix 40 mg daily and use extra if weight increases.   2. CAD without angina: No chest pain suggestive of angina. She was found to have multivessel CAD by cath in June 2017 and was not a candidate for CABG so stents were placed in the ostial and mid RCA, proximal Circumflex, OM2 and first Diagonal. Continue Plavix, statin and beta blocker.   3. Ischemic CM: LV systolic function improved post PCI, LVEF up to 45%. Continue beta blocker, ARB and aldactone.   4. Persistent Atrial Fibrillation: She is in atrial fib. Rate controlled. Continue Eliquis. Continue beta blocker.    5. HTN: BP controlled. No changes.   Current medicines are reviewed at length with the patient today.  The patient does not have concerns regarding medicines.  The following changes have been made:  Stop ASA  Labs/ tests ordered today include:   Orders Placed This Encounter  Procedures  . EKG 12-Lead    Disposition:   FU with me in 6 months  Signed, Lauree Chandler, MD 08/12/2016 1:41 PM    [facility] [place], [place], [place]  [address] Phone: [phone]; Fax: [phone]

## 2016-08-12 NOTE — Patient Instructions (Signed)

## 2016-08-13 DIAGNOSIS — I5022 Chronic systolic (congestive) heart failure: Secondary | ICD-10-CM | POA: Diagnosis not present

## 2016-08-13 DIAGNOSIS — Z7901 Long term (current) use of anticoagulants: Secondary | ICD-10-CM | POA: Diagnosis not present

## 2016-08-13 DIAGNOSIS — I252 Old myocardial infarction: Secondary | ICD-10-CM | POA: Diagnosis not present

## 2016-08-13 DIAGNOSIS — R2681 Unsteadiness on feet: Secondary | ICD-10-CM | POA: Diagnosis not present

## 2016-08-13 DIAGNOSIS — I4891 Unspecified atrial fibrillation: Secondary | ICD-10-CM | POA: Diagnosis not present

## 2016-08-13 DIAGNOSIS — I110 Hypertensive heart disease with heart failure: Secondary | ICD-10-CM | POA: Diagnosis not present

## 2016-08-13 DIAGNOSIS — I255 Ischemic cardiomyopathy: Secondary | ICD-10-CM | POA: Diagnosis not present

## 2016-08-13 DIAGNOSIS — I739 Peripheral vascular disease, unspecified: Secondary | ICD-10-CM | POA: Diagnosis not present

## 2016-08-13 DIAGNOSIS — R531 Weakness: Secondary | ICD-10-CM | POA: Diagnosis not present

## 2016-08-16 DIAGNOSIS — Z86718 Personal history of other venous thrombosis and embolism: Secondary | ICD-10-CM | POA: Diagnosis not present

## 2016-08-16 DIAGNOSIS — R2681 Unsteadiness on feet: Secondary | ICD-10-CM | POA: Diagnosis not present

## 2016-08-16 DIAGNOSIS — I255 Ischemic cardiomyopathy: Secondary | ICD-10-CM | POA: Diagnosis not present

## 2016-08-16 DIAGNOSIS — Z7901 Long term (current) use of anticoagulants: Secondary | ICD-10-CM | POA: Diagnosis not present

## 2016-08-16 DIAGNOSIS — I110 Hypertensive heart disease with heart failure: Secondary | ICD-10-CM | POA: Diagnosis not present

## 2016-08-16 DIAGNOSIS — I252 Old myocardial infarction: Secondary | ICD-10-CM | POA: Diagnosis not present

## 2016-08-16 DIAGNOSIS — Z87891 Personal history of nicotine dependence: Secondary | ICD-10-CM

## 2016-08-16 DIAGNOSIS — R531 Weakness: Secondary | ICD-10-CM | POA: Diagnosis not present

## 2016-08-16 DIAGNOSIS — Z7902 Long term (current) use of antithrombotics/antiplatelets: Secondary | ICD-10-CM | POA: Diagnosis not present

## 2016-08-16 DIAGNOSIS — I5022 Chronic systolic (congestive) heart failure: Secondary | ICD-10-CM | POA: Diagnosis not present

## 2016-08-16 DIAGNOSIS — I4891 Unspecified atrial fibrillation: Secondary | ICD-10-CM | POA: Diagnosis not present

## 2016-08-16 DIAGNOSIS — I739 Peripheral vascular disease, unspecified: Secondary | ICD-10-CM | POA: Diagnosis not present

## 2016-08-16 DIAGNOSIS — Z8673 Personal history of transient ischemic attack (TIA), and cerebral infarction without residual deficits: Secondary | ICD-10-CM | POA: Diagnosis not present

## 2016-08-16 DIAGNOSIS — Z9114 Patient's other noncompliance with medication regimen: Secondary | ICD-10-CM

## 2016-08-23 DIAGNOSIS — I4891 Unspecified atrial fibrillation: Secondary | ICD-10-CM | POA: Diagnosis not present

## 2016-08-23 DIAGNOSIS — I5022 Chronic systolic (congestive) heart failure: Secondary | ICD-10-CM | POA: Diagnosis not present

## 2016-08-23 DIAGNOSIS — Z7901 Long term (current) use of anticoagulants: Secondary | ICD-10-CM | POA: Diagnosis not present

## 2016-08-23 DIAGNOSIS — I255 Ischemic cardiomyopathy: Secondary | ICD-10-CM | POA: Diagnosis not present

## 2016-08-23 DIAGNOSIS — R531 Weakness: Secondary | ICD-10-CM | POA: Diagnosis not present

## 2016-08-23 DIAGNOSIS — R2681 Unsteadiness on feet: Secondary | ICD-10-CM | POA: Diagnosis not present

## 2016-08-23 DIAGNOSIS — I739 Peripheral vascular disease, unspecified: Secondary | ICD-10-CM | POA: Diagnosis not present

## 2016-08-23 DIAGNOSIS — I252 Old myocardial infarction: Secondary | ICD-10-CM | POA: Diagnosis not present

## 2016-08-23 DIAGNOSIS — I110 Hypertensive heart disease with heart failure: Secondary | ICD-10-CM | POA: Diagnosis not present

## 2016-08-26 DIAGNOSIS — H40023 Open angle with borderline findings, high risk, bilateral: Secondary | ICD-10-CM | POA: Diagnosis not present

## 2016-08-26 DIAGNOSIS — H401123 Primary open-angle glaucoma, left eye, severe stage: Secondary | ICD-10-CM | POA: Diagnosis not present

## 2016-08-29 ENCOUNTER — Telehealth: Payer: Self-pay | Admitting: Internal Medicine

## 2016-08-29 NOTE — Telephone Encounter (Signed)
Patient will have a missed nursing visit this week.  Has tried to make contact and has also went to patients house.  Can not make contact.

## 2016-08-29 NOTE — Telephone Encounter (Signed)
noted 

## 2016-09-04 ENCOUNTER — Telehealth: Payer: Self-pay | Admitting: Internal Medicine

## 2016-09-04 NOTE — Telephone Encounter (Signed)
States giving 48 hour notice that will be discontinuing patients services b/c patient has met all goals and is compliant with medication.

## 2016-09-04 NOTE — Telephone Encounter (Signed)
noted 

## 2016-09-05 ENCOUNTER — Telehealth: Payer: Self-pay | Admitting: Cardiovascular Disease

## 2016-09-05 NOTE — Telephone Encounter (Signed)
I spoke with Tammy Sours at [facility] and told him refills for this should come from primary care.

## 2016-09-05 NOTE — Telephone Encounter (Signed)
New message    Tammy Sours from [facility] calling because he needs to know if pt is approved for Latanioprost refills. Requests a call back

## 2016-09-09 DIAGNOSIS — H401113 Primary open-angle glaucoma, right eye, severe stage: Secondary | ICD-10-CM | POA: Diagnosis not present

## 2016-09-09 DIAGNOSIS — H40051 Ocular hypertension, right eye: Secondary | ICD-10-CM | POA: Diagnosis not present

## 2016-10-09 ENCOUNTER — Other Ambulatory Visit: Payer: Self-pay | Admitting: Internal Medicine

## 2016-10-10 IMAGING — NM NM MISC PROCEDURE
4 series · 24 of 24 positions shown · non-contrast
Comparison: none

[Series 1: wbr_s-proj_st stress · 6.40mm/px · 6 of 64 frames shown]
[frame 6/64]
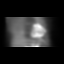
[frame 16/64]
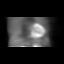
[frame 27/64]
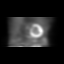
[frame 38/64]
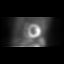
[frame 48/64]
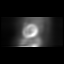
[frame 59/64]
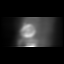

[Series 1: stress · 6.40mm/px · 6 of 64 frames shown]
[frame 6/64]
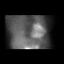
[frame 16/64]
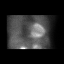
[frame 27/64]
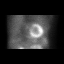
[frame 38/64]
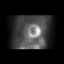
[frame 48/64]
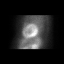
[frame 59/64]
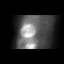

[Series 1: rest · 6.40mm/px · 6 of 64 frames shown]
[frame 6/64]
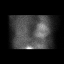
[frame 16/64]
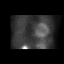
[frame 27/64]
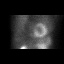
[frame 38/64]
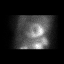
[frame 48/64]
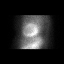
[frame 59/64]
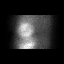

[Series 1: wbr_r-proj_st rest · 6.40mm/px · 6 of 64 frames shown]
[frame 6/64]
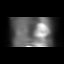
[frame 16/64]
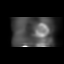
[frame 27/64]
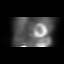
[frame 38/64]
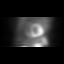
[frame 48/64]
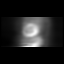
[frame 59/64]
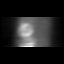

[24 of 24 positions shown; findings below may reference images not displayed]

Canned report from images found in remote index.

Refer to host system for actual result text.

## 2016-10-21 DIAGNOSIS — H40053 Ocular hypertension, bilateral: Secondary | ICD-10-CM | POA: Diagnosis not present

## 2016-10-21 DIAGNOSIS — H401133 Primary open-angle glaucoma, bilateral, severe stage: Secondary | ICD-10-CM | POA: Diagnosis not present

## 2016-11-20 NOTE — Progress Notes (Addendum)
Subjective:   Yvonne Bailey is a 76 y.o. female who presents for Medicare Annual (Subsequent) preventive examination.  Review of Systems:  No ROS.  Medicare Wellness Visit. Additional risk factors are reflected in the social history.  Cardiac Risk Factors include: advanced age (>12mn, >>25women);hypertension;dyslipidemia Sleep patterns: feels rested on waking, gets up 1 times nightly to void and sleeps 7-8 hours nightly.    Home Safety/Smoke Alarms: Feels safe in home. Smoke alarms in place.  Living environment; residence and Firearm Safety: 1-story house/ trailer, equipment: CRadio producer Type: SIzard no firearms. Seat Belt Safety/Bike Helmet: Wears seat belt.      Objective:     Vitals: BP 134/76   Pulse 74   Resp 20   Ht _0  (1.676 m)   Wt 230 lb (104.3 kg)   SpO2 98%   BMI 37.12 kg/m   Body mass index is 37.12 kg/m.   Tobacco History  Smoking Status  . Former Smoker  . Quit date: 03/11/1990  Smokeless Tobacco  . Never Used    Comment: smoked 1Erhard up to < 5 cigarettes     Counseling given: Not Answered   Past Medical History:  Diagnosis Date  . CAD (coronary artery disease)    S/P  anterior  wall myocardial infarction in '92, treated w/percutaneous transluminal coronary angioplasty of the left anterior descending. EF 45%  . Carotid artery disease ([place]   . DVT (deep venous thrombosis) ([place]    X1  . History of nuclear stress test    a. Myoview 6/17: EF 20-25%, mid anteroseptal, apical anterior, apical septal, apical inferior, apical lateral and apical scar, no ischemia, intermediate risk  . HTN (hypertension)   . Hyperlipidemia   . Hypothyroidism   . Ischemic cardiomyopathy    a. Echo 6/17: EF 20-25%, apex appears akinetic, MAC, moderate MR, moderate LAE, mild RVE, trivial PI, PASP 47 mmHg (needs repeat with Definity contrast) //  b. Limited echo with Definity contrast 7/17: EF 25-30%, moderate to severe LAE  . MI (myocardial infarction)  ([place] 1992   at age 76 TIA treated with coumadin until Plavix initiated in 2006  . PAD (peripheral artery disease) ([facility])    Right SFA occlusion, severe disease left CFA and SFA   Past Surgical History:  Procedure Laterality Date  . APPENDECTOMY     at hysterectomy and USO for fibroids, Dr. HYsidro Evert . CARDIAC CATHETERIZATION  1992   Dr BEustace Quail . CARDIAC CATHETERIZATION N/A 09/06/2015   Procedure: Right/Left Heart Cath and Coronary Angiography;  Surgeon: CBurnell Blanks MD;  [facility];  Service: Cardiovascular;  Laterality: N/A;  . CARDIAC CATHETERIZATION N/A 09/07/2015   Procedure: Coronary Stent Intervention;  Surgeon: CBurnell Blanks MD;  [facility];  Service: Cardiovascular;  Laterality: N/A;  . COLONOSCOPY     negative; 2008, Dr. DDelfin Edis . fracture LLE     '94; pinned  . TONSILLECTOMY    . TOTAL ABDOMINAL HYSTERECTOMY     & BSO for fibroids   Family History  Problem Relation Age of Onset  . Diabetes Mother   . Hypertension Mother   . Stroke Brother        ?> 55  . Coronary artery disease Brother        stent in 677s . Cancer Neg Hx    History  Sexual Activity  . Sexual activity: Not on file  Outpatient Encounter Prescriptions as of 11/21/2016  Medication Sig  . amLODipine (NORVASC) 2.5 MG tablet Take 1 tablet (2.5 mg total) by mouth daily.  Marland Kitchen apixaban (ELIQUIS) 5 MG TABS tablet Take 1 tablet (5 mg total) by mouth 2 (two) times daily.  Marland Kitchen atorvastatin (LIPITOR) 40 MG tablet Take 1 tablet (40 mg total) by mouth daily.  . benazepril (LOTENSIN) 20 MG tablet TAKE 1 TABLET EVERY DAY  . brimonidine (ALPHAGAN) 0.2 % ophthalmic solution PLACE ONE DROPTO THE LEFT EYE TWICE  DAILY  . cholecalciferol (VITAMIN D) 1000 UNITS tablet Take 1,000 Units by mouth daily.  . clopidogrel (PLAVIX) 75 MG tablet Take 1 tablet (75 mg total) by mouth daily.  . fish oil-omega-3 fatty acids 1000 MG capsule Take 1,000 mg by mouth daily.    Marland Kitchen latanoprost (XALATAN) 0.005 % ophthalmic solution Place 0.005 drops into both eyes every evening.  Marland Kitchen levothyroxine (SYNTHROID, LEVOTHROID) 112 MCG tablet Take 1 tablet (112 mcg total) by mouth daily.  . metoprolol succinate (TOPROL-XL) 50 MG 24 hr tablet Take 1 tablet (50 mg total) by mouth 2 (two) times daily. Take with or immediately following a meal.  . Multiple Vitamin (MULTIVITAMIN) tablet Take 1 tablet by mouth daily.    . nitroGLYCERIN (NITROSTAT) 0.4 MG SL tablet Place 1 tablet (0.4 mg total) under the tongue every 5 (five) minutes as needed for chest pain.  Marland Kitchen Potassium Chloride ER 20 MEQ TBCR Take 20 mEq by mouth daily.  Marland Kitchen spironolactone (ALDACTONE) 25 MG tablet Take 1 tablet (25 mg total) by mouth daily.  . furosemide (LASIX) 40 MG tablet Take 1 tablet (40 mg total) by mouth daily.   No facility-administered encounter medications on file as of 11/21/2016.     Activities of Daily Living In your present state of health, do you have any difficulty performing the following activities: 11/21/2016  Hearing? N  Vision? N  Difficulty concentrating or making decisions? N  Walking or climbing stairs? N  Dressing or bathing? N  Doing errands, shopping? N  Preparing Food and eating ? N  Using the Toilet? N  In the past six months, have you accidently leaked urine? N  Do you have problems with loss of bowel control? N  Managing your Medications? N  Managing your Finances? N  Housekeeping or managing your Housekeeping? N  Some recent data might be hidden    Patient Care Team: Binnie Rail, MD as PCP - General (Internal Medicine) Burnell Blanks, MD as Consulting Physician (Cardiology)    Assessment:    Physical assessment deferred to PCP.  Exercise Activities and Dietary recommendations Current Exercise Habits: Structured exercise class, Type of exercise: strength training/weights, Time (Minutes): 30, Frequency (Times/Week): 2, Weekly Exercise (Minutes/Week): 60,  Intensity: Mild, Exercise limited by: orthopedic condition(s)  Diet (meal preparation, eat out, water intake, caffeinated beverages, dairy products, fruits and vegetables): in general, a "healthy" diet  , well balanced, eats a variety of fruits and vegetables daily, limits salt, fat/cholesterol, sugar, caffeine, drinks 6-8 glasses of water daily.      Goals    . lose weight          Start to serve my meals on a smaller plate, drink a glass of water 20-30 minutes before I eat my meal, and increase my physical activity.    . Weight (lb) < 200 lb (90.7 kg)          Exercise at the Cardiac rehab Uses stretch bands;  Drinks a  lot of water Eat fish;  Will seek dietician in Cardiac rehab     . Weight < 180 lb (81.647 kg)          Not sure how to lose the weight and agree that MNT may assist her. Diabetes and weight loss;  [email or web address]  Deaf & Hard of Hearing Division Services  No reviews  [facility]  [id number]  [phone]  Diabetes and medical nutrition [facility] Address: [address] [place], [place] [address]  Phone: [phone]       Fall Risk Fall Risk  11/21/2016 11/02/2015 10/03/2015 12/15/2014 05/14/2013  Falls in the past year? No No No Yes No  Number falls in past yr: - - - 1 -  Injury with Fall? - - - No -  Risk for fall due to : - - Impaired balance/gait;Other (Comment) - -  Risk for fall due to: Comment - - high rsk due to fall screening and single leg stand less than 5 seconds - -  Follow up - - - Education provided -  Comment - - - educated on  unlevel surfaces -   Depression Screen PHQ 2/9 Scores 11/21/2016 01/12/2016 11/02/2015 10/09/2015  PHQ - 2 Score 0 0 0 0  PHQ- 9 Score 0 - - -     Cognitive Function MMSE - Mini Mental State Exam 11/21/2016 11/02/2015  Not completed: - (No Data)  Orientation to time 5 -  Orientation to Place 5 -  Registration 3 -  Attention/ Calculation 5 -  Recall 2 -  Language-  name 2 objects 2 -  Language- repeat 1 -  Language- follow 3 step command 3 -  Language- read & follow direction 1 -  Write a sentence 1 -  Copy design 1 -  Total score 29 -        Immunization History  Administered Date(s) Administered  . Pneumococcal Conjugate-13 12/15/2014  . Pneumococcal Polysaccharide-23 08/31/2009   Screening Tests Health Maintenance  Topic Date Due  . TETANUS/TDAP  07/11/1959  . INFLUENZA VACCINE  06/08/2017 (Originally 10/09/2016)  . DEXA SCAN  11/21/2017 (Originally 07/10/2005)  . PNA vac Low Risk Adult  Completed      Plan:     Continue doing brain stimulating activities (puzzles, reading, adult coloring books, staying active) to keep memory sharp.   Continue to eat heart healthy diet (full of fruits, vegetables, whole grains, lean protein, water--limit salt, fat, and sugar intake) and increase physical activity as tolerated.  I have personally reviewed and noted the following in the patient's chart:   . Medical and social history . Use of alcohol, tobacco or illicit drugs  . Current medications and supplements . Functional ability and status . Nutritional status . Physical activity . Advanced directives . List of other physicians . Vitals . Screenings to include cognitive, depression, and falls . Referrals and appointments  In addition, I have reviewed and discussed with patient certain preventive protocols, quality metrics, and best practice recommendations. A written personalized care plan for preventive services as well as general preventive health recommendations were provided to patient.     Michiel Cowboy, RN  11/21/2016   Medical screening examination/treatment/procedure(s) were performed by non-physician practitioner and as supervising physician I was immediately available for consultation/collaboration. I agree with above. Binnie Rail, MD

## 2016-11-20 NOTE — Progress Notes (Signed)
Pre visit review using our clinic review tool, if applicable. No additional management support is needed unless otherwise documented below in the visit note. 

## 2016-11-21 ENCOUNTER — Ambulatory Visit (INDEPENDENT_AMBULATORY_CARE_PROVIDER_SITE_OTHER): Payer: Medicare HMO | Admitting: *Deleted

## 2016-11-21 VITALS — BP 134/76 | HR 74 | Resp 20 | Ht 66.0 in | Wt 230.0 lb

## 2016-11-21 DIAGNOSIS — Z0000 Encounter for general adult medical examination without abnormal findings: Secondary | ICD-10-CM

## 2016-11-21 NOTE — Patient Instructions (Addendum)
Continue doing brain stimulating activities (puzzles, reading, adult coloring books, staying active) to keep memory sharp.   Continue to eat heart healthy diet (full of fruits, vegetables, whole grains, lean protein, water--limit salt, fat, and sugar intake) and increase physical activity as tolerated.   Yvonne Bailey , Thank you for taking time to come for your Medicare Wellness Visit. I appreciate your ongoing commitment to your health goals. Please review the following plan we discussed and let me know if I can assist you in the future.   These are the goals we discussed: Goals    . lose weight          Start to serve my meals on a smaller plate, drink a glass of water 20-30 minutes before I eat my meal, and increase my physical activity.    . Weight (lb) < 200 lb (90.7 kg)          Exercise at the Cardiac rehab Uses stretch bands;  Drinks a lot of water Eat fish;  Will seek dietician in Cardiac rehab     . Weight < 180 lb (81.647 kg)          Not sure how to lose the weight and agree that MNT may assist her. Diabetes and weight loss;  [email or web address]  [facility]  No reviews  [facility]  [address]  [phone]  [facility] nutrition [facility] Address: [address], [place], [place] [address]  Phone: [phone]        This is a list of the screening recommended for you and due dates:  Health Maintenance  Topic Date Due  . Tetanus Vaccine  07/11/1959  . Flu Shot  06/08/2017*  . DEXA scan (bone density measurement)  11/21/2017*  . Pneumonia vaccines  Completed  *Topic was postponed. The date shown is not the original due date.     Carbohydrate Counting for Diabetes Mellitus, Adult Carbohydrate counting is a method for keeping track of how many carbohydrates you eat. Eating carbohydrates naturally increases the amount of sugar (glucose) in the blood. Counting how many  carbohydrates you eat helps keep your blood glucose within normal limits, which helps you manage your diabetes (diabetes mellitus). It is important to know how many carbohydrates you can safely have in each meal. This is different for every person. A diet and nutrition specialist (registered dietitian) can help you make a meal plan and calculate how many carbohydrates you should have at each meal and snack. Carbohydrates are found in the following foods:  Grains, such as breads and cereals.  Dried beans and soy products.  Starchy vegetables, such as potatoes, peas, and corn.  Fruit and fruit juices.  Milk and yogurt.  Sweets and snack foods, such as cake, cookies, candy, chips, and soft drinks.  How do I count carbohydrates? There are two ways to count carbohydrates in food. You can use either of the methods or a combination of both. Reading "Nutrition Facts" on packaged food The "Nutrition Facts" list is included on the labels of almost all packaged foods and beverages in the [place] It includes:  The serving size.  Information about nutrients in each serving, including the grams (g) of carbohydrate per serving.  To use the "Nutrition Facts":  Decide how many servings you will have.  Multiply the number of servings by the number of carbohydrates per serving.  The resulting number is the total amount of  carbohydrates that you will be having.  Learning standard serving sizes of other foods When you eat foods containing carbohydrates that are not packaged or do not include "Nutrition Facts" on the label, you need to measure the servings in order to count the amount of carbohydrates:  Measure the foods that you will eat with a food scale or measuring cup, if needed.  Decide how many standard-size servings you will eat.  Multiply the number of servings by 15. Most carbohydrate-rich foods have about 15 g of carbohydrates per serving. ? For example, if you eat 8 oz (170 g) of  strawberries, you will have eaten 2 servings and 30 g of carbohydrates (2 servings x 15 g = 30 g).  For foods that have more than one food mixed, such as soups and casseroles, you must count the carbohydrates in each food that is included.  The following list contains standard serving sizes of common carbohydrate-rich foods. Each of these servings has about 15 g of carbohydrates:   hamburger bun or  English muffin.   oz (15 mL) syrup.   oz (14 g) jelly.  1 slice of bread.  1 six-inch tortilla.  3 oz (85 g) cooked rice or pasta.  4 oz (113 g) cooked dried beans.  4 oz (113 g) starchy vegetable, such as peas, corn, or potatoes.  4 oz (113 g) hot cereal.  4 oz (113 g) mashed potatoes or  of a large baked potato.  4 oz (113 g) canned or frozen fruit.  4 oz (120 mL) fruit juice.  4-6 crackers.  6 chicken nuggets.  6 oz (170 g) unsweetened dry cereal.  6 oz (170 g) plain fat-free yogurt or yogurt sweetened with artificial sweeteners.  8 oz (240 mL) milk.  8 oz (170 g) fresh fruit or one small piece of fruit.  24 oz (680 g) popped popcorn.  Example of carbohydrate counting Sample meal  3 oz (85 g) chicken breast.  6 oz (170 g) brown rice.  4 oz (113 g) corn.  8 oz (240 mL) milk.  8 oz (170 g) strawberries with sugar-free whipped topping. Carbohydrate calculation 1. Identify the foods that contain carbohydrates: ? Rice. ? Corn. ? Milk. ? Strawberries. 2. Calculate how many servings you have of each food: ? 2 servings rice. ? 1 serving corn. ? 1 serving milk. ? 1 serving strawberries. 3. Multiply each number of servings by 15 g: ? 2 servings rice x 15 g = 30 g. ? 1 serving corn x 15 g = 15 g. ? 1 serving milk x 15 g = 15 g. ? 1 serving strawberries x 15 g = 15 g. 4. Add together all of the amounts to find the total grams of carbohydrates eaten: ? 30 g + 15 g + 15 g + 15 g = 75 g of carbohydrates total. This information is not intended to replace  advice given to you by your health care provider. Make sure you discuss any questions you have with your health care provider. Document Released: 02/25/2005 Document Revised: 09/15/2015 Document Reviewed: 08/09/2015 Elsevier Interactive Patient Education  2018 [employer]. Reading Food Labels Foods that are packaged or in containers have a Nutrition Facts panel on the side or back. This is commonly called the food label. The food label helps you make healthy food choices by providing information about serving size and the amount of calories and various nutrients in the food. You can check the food label to find out  if the food contains high or low amounts of nutrients that you want to limit in your diet. You can also use the food label to see if the food is a good source of the nutrients that you want to make sure are included in your diet. How do I read the food label?  Begin by checking the serving size and number of servings in the container. All of the nutrition information listed on the food label is based on one serving. If you eat more than one serving, you must multiply the amounts (such as calories, grams of saturated fat, or milligrams of sodium) by the number of servings.  Check the calories. Choosing foods that are low in calories can help you manage your weight.  Look at the numbers in the % Daily Value column for each listed nutrient. This gives you an idea of how much of the daily recommended amount for that nutrient is provided in one serving of the food. A daily value of 5% or less is considered low. A daily value of 20% or more is considered high.  Check the amounts for the items you should limit in your diet. These include: ? Total fat. ? Saturated fat. ? Trans fat. ? Cholesterol. ? Sodium.  Check the amounts for the items you should make sure you get enough of. These include: ? Dietary fiber. ? Vitamins A and C. ? Calcium. ? Iron. What information is provided on the  food label? Serving information  Serving size. ? The serving size is listed in cups or pieces. The nutrient amounts listed on the food label apply to this amount of the food.  Servings per container or package. ? This shows the number of servings you can expect to get from the container or package if you follow the suggested serving size. Amount per serving  Calories. ? The number of calories in one serving of the food. This information is helpful in managing weight. Low-calorie foods contain 40 calories or less. High-calorie foods contain 400 or more calories.  Calories from fat. ? The number of calories that come from fat in one serving. Percent daily value Percent daily value (shown on the label as % Daily Value) tells you what percent of the daily value for each nutrient one serving provides. The daily value is the recommended amount of the nutrient that you should get each day. For example, if 15% is listed next to dietary fiber, it means that one serving of the food will give you 15% of the recommended amount of fiber that you should get in a day. The daily values are based on a 2,000-calorie-per-day diet. You may get more or less than 2,000 calories in your diet each day, but the % Daily Value gives you an idea of whether the food contains a high or low amount of the listed nutrient. A daily value of 5% or less is low. A daily value of 20% or more is high. Total fat Total fat shows you the total amount of fat in one serving (listed in grams). Foods with high amounts of fat usually have higher calories and may lead to weight gain. Two of the fats that make up a portion of the total fat are included on the label:  Saturated fat. ? This number is the amount of saturated fat in one serving (listed in grams). Saturated fat increases the amount of blood cholesterol and should be limited to less than 7% of total calories each day.  This means that if you eat 2,000 calories each day, you should  eat less than 140 calories from saturated fat.    Cholesterol The amount of cholesterol in one serving is listed in milligrams. Cholesterol should be limited to no more than 300 mg each day. Sodium The amount of sodium in one serving is listed in milligrams. Most people should limit their sodium intake to 2,300 mg a day. Total carbohydrate This number shows the amount of total carbohydrate in one serving (listed in grams). This information can help people with diabetes manage the amount of carbohydrate they eat. Two of the carbohydrates that make up a portion of the total carbohydrate are included on the label:  Dietary fiber. ? The amount of dietary fiber in one serving is listed in grams. Most people should eat at least 25 g of dietary fiber each day.  Sugars. ? The amount of sugar in one serving is also listed in grams. This value includes both naturally occurring sugars from fruit and milk and added sugars such as honey or table sugar.  Protein The amount of protein in one serving is listed in grams. What other important labeling is on the food package? Ingredients Food labels will list each ingredient in the food. The first ingredient listed is the ingredient that the food has the most of. The ingredients are listed in the order of their amount by weight from highest to lowest. Food allergen labeling Food labels may also include a food allergen warning. Listed here are ingredients that can cause allergic reactions in some people. The potential allergens are listed behind the word "Contains" or "May contain." Examples of ingredients that may be listed are wheat, dairy, eggs, soy, and nuts. If a person knows that he or she is allergic to one of these ingredients, he or she will know to avoid that food. Where to find more information:  [place] Food and Drug Administration: [employer] This information is not intended to replace advice given to you by your health care provider. Make sure you  discuss any questions you have with your health care provider. Document Released: 02/25/2005 Document Revised: 10/25/2015 Document Reviewed: 01/18/2013 Elsevier Interactive Patient Education  2017 [employer]. Diabetes Mellitus and Food It is important for you to manage your blood sugar (glucose) level. Your blood glucose level can be greatly affected by what you eat. Eating healthier foods in the appropriate amounts throughout the day at about the same time each day will help you control your blood glucose level. It can also help slow or prevent worsening of your diabetes mellitus. Healthy eating may even help you improve the level of your blood pressure and reach or maintain a healthy weight. General recommendations for healthful eating and cooking habits include:  Eating meals and snacks regularly. Avoid going long periods of time without eating to lose weight.  Eating a diet that consists mainly of plant-based foods, such as fruits, vegetables, nuts, legumes, and whole grains.  Using low-heat cooking methods, such as baking, instead of high-heat cooking methods, such as deep frying.  Work with your dietitian to make sure you understand how to use the Nutrition Facts information on food labels. How can food affect me? Carbohydrates Carbohydrates affect your blood glucose level more than any other type of food. Your dietitian will help you determine how many carbohydrates to eat at each meal and teach you how to count carbohydrates. Counting carbohydrates is important to keep your blood glucose at a healthy level, especially  if you are using insulin or taking certain medicines for diabetes mellitus. Alcohol Alcohol can cause sudden decreases in blood glucose (hypoglycemia), especially if you use insulin or take certain medicines for diabetes mellitus. Hypoglycemia can be a life-threatening condition. Symptoms of hypoglycemia (sleepiness, dizziness, and disorientation) are similar to symptoms of  having too much alcohol. If your health care provider has given you approval to drink alcohol, do so in moderation and use the following guidelines:  Women should not have more than one drink per day, and men should not have more than two drinks per day. One drink is equal to: ? 12 oz of beer. ? 5 oz of [place]row. ? 1 oz of hard liquor.  Do not drink on an empty stomach.  Keep yourself hydrated. Have water, diet soda, or unsweetened iced tea.  Regular soda, juice, and other mixers might contain a lot of carbohydrates and should be counted.  What foods are not recommended? As you make food choices, it is important to remember that all foods are not the same. Some foods have fewer nutrients per serving than other foods, even though they might have the same number of calories or carbohydrates. It is difficult to get your body what it needs when you eat foods with fewer nutrients. Examples of foods that you should avoid that are high in calories and carbohydrates but low in nutrients include:  Trans fats (most processed foods list trans fats on the Nutrition Facts label).  Regular soda.  Juice.  Candy.  Sweets, such as cake, pie, doughnuts, and cookies.  Fried foods.  What foods can I eat? Eat nutrient-rich foods, which will nourish your body and keep you healthy. The food you should eat also will depend on several factors, including:  The calories you need.  The medicines you take.  Your weight.  Your blood glucose level.  Your blood pressure level.  Your cholesterol level.  You should eat a variety of foods, including:  Protein. ? Lean cuts of meat. ? Proteins low in saturated fats, such as fish, egg whites, and beans. Avoid processed meats.  Fruits and vegetables. ? Fruits and vegetables that may help control blood glucose levels, such as apples, mangoes, and yams.  Dairy products. ? Choose fat-free or low-fat dairy products, such as milk, yogurt, and  cheese.  Grains, bread, pasta, and rice. ? Choose whole grain products, such as multigrain bread, whole oats, and brown rice. These foods may help control blood pressure.  Fats. ? Foods containing healthful fats, such as nuts, avocado, olive oil, canola oil, and fish.  Does everyone with diabetes mellitus have the same meal plan? Because every person with diabetes mellitus is different, there is not one meal plan that works for everyone. It is very important that you meet with a dietitian who will help you create a meal plan that is just right for you. This information is not intended to replace advice given to you by your health care provider. Make sure you discuss any questions you have with your health care provider. Document Released: 11/22/2004 Document Revised: 08/03/2015 Document Reviewed: 01/22/2013 Elsevier Interactive Patient Education  2017 [employer].

## 2016-11-27 ENCOUNTER — Encounter: Payer: Self-pay | Admitting: Gastroenterology

## 2016-12-09 ENCOUNTER — Other Ambulatory Visit: Payer: Self-pay | Admitting: Cardiovascular Disease

## 2016-12-26 ENCOUNTER — Encounter: Payer: Self-pay | Admitting: Gastroenterology

## 2016-12-26 ENCOUNTER — Other Ambulatory Visit: Payer: Self-pay | Admitting: Cardiovascular Disease

## 2016-12-26 MED ORDER — SPIRONOLACTONE 25 MG PO TABS: 25.0000 mg | ORAL_TABLET | Freq: Every day | ORAL | 7 refills | Status: DC

## 2017-01-29 ENCOUNTER — Ambulatory Visit: Payer: Medicare HMO | Admitting: Internal Medicine

## 2017-02-04 ENCOUNTER — Encounter: Payer: Self-pay | Admitting: Internal Medicine

## 2017-02-04 ENCOUNTER — Other Ambulatory Visit (INDEPENDENT_AMBULATORY_CARE_PROVIDER_SITE_OTHER): Payer: Medicare HMO

## 2017-02-04 ENCOUNTER — Ambulatory Visit: Payer: Medicare HMO | Admitting: Internal Medicine

## 2017-02-04 VITALS — BP 150/90 | HR 74 | Temp 97.8°F | Resp 18 | Wt 229.0 lb

## 2017-02-04 DIAGNOSIS — E785 Hyperlipidemia, unspecified: Secondary | ICD-10-CM

## 2017-02-04 DIAGNOSIS — E038 Other specified hypothyroidism: Secondary | ICD-10-CM

## 2017-02-04 DIAGNOSIS — I10 Essential (primary) hypertension: Secondary | ICD-10-CM | POA: Diagnosis not present

## 2017-02-04 DIAGNOSIS — R7303 Prediabetes: Secondary | ICD-10-CM | POA: Diagnosis not present

## 2017-02-04 DIAGNOSIS — I2510 Atherosclerotic heart disease of native coronary artery without angina pectoris: Secondary | ICD-10-CM

## 2017-02-04 LAB — COMPREHENSIVE METABOLIC PANEL
ALT: 14 U/L (ref 0–35)
AST: 18 U/L (ref 0–37)
Albumin: 4.1 g/dL (ref 3.5–5.2)
Alkaline Phosphatase: 111 U/L (ref 39–117)
BUN: 16 mg/dL (ref 6–23)
CO2: 29 mEq/L (ref 19–32)
Calcium: 9.5 mg/dL (ref 8.4–10.5)
Chloride: 107 mEq/L (ref 96–112)
Creatinine, Ser: 0.97 mg/dL (ref 0.40–1.20)
GFR: 71.7 mL/min (ref >60.00)
Glucose, Bld: 88 mg/dL (ref 70–99)
Potassium: 3.8 mEq/L (ref 3.5–5.1)
Sodium: 141 mEq/L (ref 135–145)
Total Bilirubin: 0.8 mg/dL (ref 0.2–1.2)
Total Protein: 7.4 g/dL (ref 6.0–8.3)

## 2017-02-04 LAB — LIPID PANEL
Cholesterol: 155 mg/dL (ref 0–200)
HDL: 44 mg/dL (ref >39.00)
LDL Cholesterol: 95 mg/dL (ref 0–99)
NonHDL: 110.64
Total CHOL/HDL Ratio: 4
Triglycerides: 76 mg/dL (ref 0.0–149.0)
VLDL: 15.2 mg/dL (ref 0.0–40.0)

## 2017-02-04 LAB — CBC WITH DIFFERENTIAL/PLATELET
Basophils Absolute: 0.1 10*3/uL (ref 0.0–0.1)
Basophils Relative: 1.3 % (ref 0.0–3.0)
Eosinophils Absolute: 0.2 10*3/uL (ref 0.0–0.7)
Eosinophils Relative: 2.1 % (ref 0.0–5.0)
HCT: 40.8 % (ref 36.0–46.0)
Hemoglobin: 13.4 g/dL (ref 12.0–15.0)
Lymphocytes Relative: 28 % (ref 12.0–46.0)
Lymphs Abs: 2.6 10*3/uL (ref 0.7–4.0)
MCHC: 32.7 g/dL (ref 30.0–36.0)
MCV: 96.9 fl (ref 78.0–100.0)
Monocytes Absolute: 0.8 10*3/uL (ref 0.1–1.0)
Monocytes Relative: 8.4 % (ref 3.0–12.0)
Neutro Abs: 5.5 10*3/uL (ref 1.4–7.7)
Neutrophils Relative %: 60.2 % (ref 43.0–77.0)
Platelets: 246 10*3/uL (ref 150.0–400.0)
RBC: 4.21 Mil/uL (ref 3.87–5.11)
RDW: 13.2 % (ref 11.5–15.5)
WBC: 9.2 10*3/uL (ref 4.0–10.5)

## 2017-02-04 LAB — HEMOGLOBIN A1C: Hgb A1c MFr Bld: 5.5 % (ref 4.6–6.5)

## 2017-02-04 LAB — TSH: TSH: 4.34 u[IU]/mL (ref 0.35–4.50)

## 2017-02-04 NOTE — Patient Instructions (Addendum)

## 2017-02-04 NOTE — Assessment & Plan Note (Signed)
BP Readings from Last 3 Encounters:  02/04/17 (!) 150/90  11/21/16 134/76  08/12/16 138/80   Usually better at home BP well controlled Current regimen effective and well tolerated Continue current medications at current doses Cmp, tsh

## 2017-02-04 NOTE — Assessment & Plan Note (Signed)
Check TSH Will adjust medication if needed Clinically euthyroid

## 2017-02-04 NOTE — Assessment & Plan Note (Signed)
Check a1c Low sugar / carb diet Stressed regular exercise   

## 2017-02-04 NOTE — Assessment & Plan Note (Signed)
Check lipid panel  Continue daily statin Regular exercise and healthy diet encouraged  

## 2017-02-04 NOTE — Assessment & Plan Note (Signed)
No concerning symptoms for active heart disease including chest pain, palpitations and shortness of breath Continue current medications Following with cardiology Check CBC, CMP

## 2017-02-04 NOTE — Progress Notes (Signed)
Subjective:    Patient ID: Yvonne Bailey, female    DOB: [birth year], 76 y.o.   MRN: [id number]  HPI The patient is here for follow up.  CAD, Hypertension: She is taking her medication daily. She is compliant with a low sodium diet.  She denies chest pain, palpitations, edema, shortness of breath and regular headaches. She is not exercising regularly.  She does monitor her blood pressure at home.    Hyperlipidemia: She is taking her medication daily. She is compliant with a low fat/cholesterol diet. She is not exercising regularly. She denies myalgias.   Hypothyroidism:  She is taking her medication daily.  She denies any recent changes in energy or weight that are unexplained.   Prediabetes:  She is compliant with a low sugar/carbohydrate diet.  She is not exercising regularly.   Medications and allergies reviewed with patient and updated if appropriate.  Patient Active Problem List   Diagnosis Date Noted  . Prediabetes 07/28/2016  . DOE (dyspnea on exertion) 07/09/2016  . Gait instability 07/09/2016  . Itching 04/16/2016  . Unstable angina ([place]) 09/06/2015  . Cardiomyopathy, ischemic   . Chronic systolic heart failure ([place]) 08/21/2015  . Bilateral leg edema 07/19/2015  . Atrial fibrillation ([place]) 07/19/2015  . Urinary urgency 03/30/2015  . Other musculoskeletal symptoms referable to limbs(729.89) 12/17/2012  . Lacunar infarction 12/17/2012  . Small vessel disease, cerebrovascular 12/17/2012  . CAROTID BRUIT, RIGHT 08/10/2008  . Peripheral vascular disease ([place]) 06/04/2007  . Diverticulosis of large intestine 06/04/2007  . Hypothyroidism 02/24/2007  . Hyperlipidemia 02/24/2007  . Essential hypertension 02/24/2007  . DVT 01/21/2007  . Coronary atherosclerosis 10/29/2006    Current Outpatient Medications on File Prior to Visit  Medication Sig Dispense Refill  . amLODipine (NORVASC) 2.5 MG tablet Take 1 tablet (2.5 mg total) by mouth daily. 90 tablet 3  . apixaban  (ELIQUIS) 5 MG TABS tablet Take 1 tablet (5 mg total) by mouth 2 (two) times daily. 180 tablet 3  . atorvastatin (LIPITOR) 40 MG tablet TAKE 1 TABLET (40 MG TOTAL) BY MOUTH DAILY. 90 tablet 3  . benazepril (LOTENSIN) 20 MG tablet TAKE 1 TABLET EVERY DAY 90 tablet 1  . brimonidine (ALPHAGAN) 0.2 % ophthalmic solution PLACE ONE DROPTO THE LEFT EYE TWICE  DAILY  0  . cholecalciferol (VITAMIN D) 1000 UNITS tablet Take 1,000 Units by mouth daily.    . clopidogrel (PLAVIX) 75 MG tablet TAKE 1 TABLET EVERY DAY 90 tablet 3  . fish oil-omega-3 fatty acids 1000 MG capsule Take 1,000 mg by mouth daily.     Marland Kitchen latanoprost (XALATAN) 0.005 % ophthalmic solution Place 0.005 drops into both eyes every evening.    Marland Kitchen levothyroxine (SYNTHROID, LEVOTHROID) 112 MCG tablet Take 1 tablet (112 mcg total) by mouth daily. 90 tablet 1  . metoprolol succinate (TOPROL-XL) 50 MG 24 hr tablet Take 1 tablet (50 mg total) by mouth 2 (two) times daily. Take with or immediately following a meal. 180 tablet 3  . Multiple Vitamin (MULTIVITAMIN) tablet Take 1 tablet by mouth daily.      . nitroGLYCERIN (NITROSTAT) 0.4 MG SL tablet Place 1 tablet (0.4 mg total) under the tongue every 5 (five) minutes as needed for chest pain. 25 tablet 1  . Potassium Chloride ER 20 MEQ TBCR Take 20 mEq by mouth daily. 30 tablet 1  . spironolactone (ALDACTONE) 25 MG tablet Take 1 tablet (25 mg total) by mouth daily. 30 tablet 7  . furosemide (LASIX)  40 MG tablet Take 1 tablet (40 mg total) by mouth daily. 90 tablet 3   No current facility-administered medications on file prior to visit.     Past Medical History:  Diagnosis Date  . CAD (coronary artery disease)    S/P  anterior  wall myocardial infarction in '92, treated w/percutaneous transluminal coronary angioplasty of the left anterior descending. EF 45%  . Carotid artery disease ([place])   . DVT (deep venous thrombosis) ([place])    X1  . History of nuclear stress test    a. Myoview 6/17: EF 20-25%,  mid anteroseptal, apical anterior, apical septal, apical inferior, apical lateral and apical scar, no ischemia, intermediate risk  . HTN (hypertension)   . Hyperlipidemia   . Hypothyroidism   . Ischemic cardiomyopathy    a. Echo 6/17: EF 20-25%, apex appears akinetic, MAC, moderate MR, moderate LAE, mild RVE, trivial PI, PASP 47 mmHg (needs repeat with Definity contrast) //  b. Limited echo with Definity contrast 7/17: EF 25-30%, moderate to severe LAE  . MI (myocardial infarction) ([place]) 1992   at age 57; TIA treated with coumadin until Plavix initiated in 2006  . PAD (peripheral artery disease) ([facility])    Right SFA occlusion, severe disease left CFA and SFA    Past Surgical History:  Procedure Laterality Date  . APPENDECTOMY     at hysterectomy and USO for fibroids, Dr. Ysidro Evert  . CARDIAC CATHETERIZATION  1992   Dr Eustace Quail  . CARDIAC CATHETERIZATION N/A 09/06/2015   Procedure: Right/Left Heart Cath and Coronary Angiography;  Surgeon: Burnell Blanks, MD;  Location: [facility];  Service: Cardiovascular;  Laterality: N/A;  . CARDIAC CATHETERIZATION N/A 09/07/2015   Procedure: Coronary Stent Intervention;  Surgeon: Burnell Blanks, MD;  Location: [facility];  Service: Cardiovascular;  Laterality: N/A;  . COLONOSCOPY     negative; 2008, Dr. Delfin Edis  . fracture LLE     '94; pinned  . TONSILLECTOMY    . TOTAL ABDOMINAL HYSTERECTOMY     & BSO for fibroids    Social History   Socioeconomic History  . Marital status: Married    Spouse name: [name]  . Number of children: 1  . Years of education: college  . Highest education level: None  Social Needs  . Financial resource strain: None  . Food insecurity - worry: None  . Food insecurity - inability: None  . Transportation needs - medical: None  . Transportation needs - non-medical: None  Occupational History  . Occupation: Pharmacist, hospital    Comment: Retired  Tobacco Use  . Smoking status: Former Smoker      Last attempt to quit: 03/11/1990    Years since quitting: 26.9  . Smokeless tobacco: Never Used  . Tobacco comment: smoked [place], up to < 5 cigarettes  Substance and Sexual Activity  . Alcohol use: No  . Drug use: No  . Sexual activity: None  Other Topics Concern  . 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 08/31/09- designated party form signed appointing, husband Alima Naser; ok to leave msg on home phone [phone].      Caffeine Small Amount one cup very rare.   Right handed.   One child- [name]    Family History  Problem Relation Age of Onset  . Diabetes Mother   . Hypertension Mother   . Stroke Brother        ?>  26  . Coronary artery disease Brother        stent in 60s  . Cancer Neg Hx     Review of Systems  Constitutional: Negative for chills and fever.  Respiratory: Negative for cough, shortness of breath and wheezing.   Cardiovascular: Negative for chest pain, palpitations and leg swelling.  Musculoskeletal: Negative for myalgias.  Neurological: Negative for light-headedness and headaches.       Objective:   Vitals:   02/04/17 1621  BP: (!) 150/90  Pulse: 74  Resp: 18  Temp: 97.8 F (36.6 C)  SpO2: 98%   Wt Readings from Last 3 Encounters:  02/04/17 229 lb (103.9 kg)  11/21/16 230 lb (104.3 kg)  08/12/16 222 lb (100.7 kg)   Body mass index is 36.96 kg/m.   Physical Exam    Constitutional: Appears well-developed and well-nourished. No distress.  HENT:  Head: Normocephalic and atraumatic.  Neck: Neck supple. No tracheal deviation present. No thyromegaly present.  No cervical lymphadenopathy Cardiovascular: Normal rate, regular rhythm and normal heart sounds.   No murmur heard. No carotid bruit .  Trace b/l edema Pulmonary/Chest: Effort normal and breath sounds normal. No respiratory distress. No has no wheezes. No rales.  Skin: Skin is warm and dry. Not diaphoretic.   Psychiatric: Normal mood and affect. Behavior is normal.      Assessment & Plan:    See Problem List for Assessment and Plan of chronic medical problems.

## 2017-02-06 ENCOUNTER — Encounter: Payer: Self-pay | Admitting: Internal Medicine

## 2017-02-19 ENCOUNTER — Ambulatory Visit: Payer: Medicare HMO | Admitting: Gastroenterology

## 2017-02-20 ENCOUNTER — Encounter: Payer: Self-pay | Admitting: Cardiovascular Disease

## 2017-02-20 ENCOUNTER — Ambulatory Visit: Payer: Medicare HMO | Admitting: Cardiovascular Disease

## 2017-02-20 VITALS — BP 166/88 | HR 81 | Ht 66.0 in | Wt 226.8 lb

## 2017-02-20 DIAGNOSIS — I255 Ischemic cardiomyopathy: Secondary | ICD-10-CM

## 2017-02-20 DIAGNOSIS — I481 Persistent atrial fibrillation: Secondary | ICD-10-CM

## 2017-02-20 DIAGNOSIS — E785 Hyperlipidemia, unspecified: Secondary | ICD-10-CM | POA: Diagnosis not present

## 2017-02-20 DIAGNOSIS — I2510 Atherosclerotic heart disease of native coronary artery without angina pectoris: Secondary | ICD-10-CM

## 2017-02-20 DIAGNOSIS — I5022 Chronic systolic (congestive) heart failure: Secondary | ICD-10-CM | POA: Diagnosis not present

## 2017-02-20 DIAGNOSIS — I10 Essential (primary) hypertension: Secondary | ICD-10-CM

## 2017-02-20 DIAGNOSIS — I4819 Other persistent atrial fibrillation: Secondary | ICD-10-CM

## 2017-02-20 MED ORDER — ATORVASTATIN CALCIUM 80 MG PO TABS
80.0000 mg | ORAL_TABLET | Freq: Every day | ORAL | 11 refills | Status: DC
Start: 2017-02-20 — End: 2017-03-18

## 2017-02-20 NOTE — Progress Notes (Signed)
Chief Complaint  Patient presents with  . Coronary Artery Disease   History of Present Illness: 76 yo female with history of CAD, HTN, PAD, carotid artery disease, hyperlipidemia and paroxysmal atrial fibrillation here today for cardiac followup. In 1992 she had an anterior MI treated with PCI. Her ejection fraction in 2007 was 45%. She also has mild carotid disease by carotid dopplers 2017. She has LE arterial disease and is known to have occlusion of the right SFA and severe stenosis left CFA and SFA. ABI January 2017 with 0.65 on right and 0.72 on the left. She is on Eliquis for PAF. Echo June 2017 with fall in LVEF. Cath June 2017 with severe three vessel CAD. She was turned down for CABG. I performed PCI with placement of drug eluting stents in the proximal and mid RCA, proximal Circumflex, OM2, Diagonal 1 in June 2017. Echo 02/08/16 with LVEF=40-45%, moderate TR, trivial MR.   She is here today for follow up. The patient denies any chest pain, dyspnea, palpitations, lower extremity edema, orthopnea, PND, dizziness, near syncope or syncope.    Primary Care Physician: Binnie Rail, MD   Past Medical History:  Diagnosis Date  . CAD (coronary artery disease)    S/P  anterior  wall myocardial infarction in '92, treated w/percutaneous transluminal coronary angioplasty of the left anterior descending. EF 45%  . Carotid artery disease ([place])   . DVT (deep venous thrombosis) ([place])    X1  . History of nuclear stress test    a. Myoview 6/17: EF 20-25%, mid anteroseptal, apical anterior, apical septal, apical inferior, apical lateral and apical scar, no ischemia, intermediate risk  . HTN (hypertension)   . Hyperlipidemia   . Hypothyroidism   . Ischemic cardiomyopathy    a. Echo 6/17: EF 20-25%, apex appears akinetic, MAC, moderate MR, moderate LAE, mild RVE, trivial PI, PASP 47 mmHg (needs repeat with Definity contrast) //  b. Limited echo with Definity contrast 7/17: EF 25-30%, moderate to  severe LAE  . MI (myocardial infarction) ([place]) 1992   at age 42; TIA treated with coumadin until Plavix initiated in 2006  . PAD (peripheral artery disease) ([facility])    Right SFA occlusion, severe disease left CFA and SFA    Past Surgical History:  Procedure Laterality Date  . APPENDECTOMY     at hysterectomy and USO for fibroids, Dr. Ysidro Evert  . CARDIAC CATHETERIZATION  1992   Dr Eustace Quail  . CARDIAC CATHETERIZATION N/A 09/06/2015   Procedure: Right/Left Heart Cath and Coronary Angiography;  Surgeon: Burnell Blanks, MD;  Location: [facility];  Service: Cardiovascular;  Laterality: N/A;  . CARDIAC CATHETERIZATION N/A 09/07/2015   Procedure: Coronary Stent Intervention;  Surgeon: Burnell Blanks, MD;  Location: [facility];  Service: Cardiovascular;  Laterality: N/A;  . COLONOSCOPY     negative; 2008, Dr. Delfin Edis  . fracture LLE     '94; pinned  . TONSILLECTOMY    . TOTAL ABDOMINAL HYSTERECTOMY     & BSO for fibroids    Current Outpatient Medications  Medication Sig Dispense Refill  . amLODipine (NORVASC) 2.5 MG tablet Take 1 tablet (2.5 mg total) by mouth daily. 90 tablet 3  . apixaban (ELIQUIS) 5 MG TABS tablet Take 1 tablet (5 mg total) by mouth 2 (two) times daily. 180 tablet 3  . benazepril (LOTENSIN) 20 MG tablet TAKE 1 TABLET EVERY DAY 90 tablet 1  . brimonidine (ALPHAGAN) 0.2 % ophthalmic  solution PLACE ONE DROPTO THE LEFT EYE TWICE  DAILY  0  . cholecalciferol (VITAMIN D) 1000 UNITS tablet Take 1,000 Units by mouth daily.    . clopidogrel (PLAVIX) 75 MG tablet TAKE 1 TABLET EVERY DAY 90 tablet 3  . fish oil-omega-3 fatty acids 1000 MG capsule Take 1,000 mg by mouth daily.     Marland Kitchen latanoprost (XALATAN) 0.005 % ophthalmic solution Place 0.005 drops into both eyes every evening.    Marland Kitchen levothyroxine (SYNTHROID, LEVOTHROID) 112 MCG tablet Take 1 tablet (112 mcg total) by mouth daily. 90 tablet 1  . metoprolol succinate (TOPROL-XL) 50 MG 24 hr tablet  Take 1 tablet (50 mg total) by mouth 2 (two) times daily. Take with or immediately following a meal. 180 tablet 3  . Multiple Vitamin (MULTIVITAMIN) tablet Take 1 tablet by mouth daily.      . nitroGLYCERIN (NITROSTAT) 0.4 MG SL tablet Place 1 tablet (0.4 mg total) under the tongue every 5 (five) minutes as needed for chest pain. 25 tablet 1  . Potassium Chloride ER 20 MEQ TBCR Take 20 mEq by mouth daily. 30 tablet 1  . spironolactone (ALDACTONE) 25 MG tablet Take 1 tablet (25 mg total) by mouth daily. 30 tablet 7  . atorvastatin (LIPITOR) 80 MG tablet Take 1 tablet (80 mg total) by mouth daily. 30 tablet 11  . furosemide (LASIX) 40 MG tablet Take 1 tablet (40 mg total) by mouth daily. 90 tablet 3   No current facility-administered medications for this visit.     Allergies  Allergen Reactions  . Definity [Perflutren Lipid Microsphere] Other (See Comments)    Patient experienced back pain with injection  . Cilostazol Other (See Comments)     Pletal :" head felt funny"    Social History   Socioeconomic History  . Marital status: Married    Spouse name: [name]  . Number of children: 1  . Years of education: college  . Highest education level: Not on file  Social Needs  . Financial resource strain: Not on file  . Food insecurity - worry: Not on file  . Food insecurity - inability: Not on file  . Transportation needs - medical: Not on file  . Transportation needs - non-medical: Not on file  Occupational History  . Occupation: Pharmacist, hospital    Comment: Retired  Tobacco Use  . Smoking status: Former Smoker    Last attempt to quit: 03/11/1990    Years since quitting: 26.9  . Smokeless tobacco: Never Used  . Tobacco comment: smoked [place], up to < 5 cigarettes  Substance and Sexual Activity  . Alcohol use: No  . Drug use: No  . Sexual activity: Not on file  Other Topics Concern  . 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 08/31/09- designated party form signed appointing, husband Zykia Walla; ok to leave msg on home phone [phone].      Caffeine Small Amount one cup very rare.   Right handed.   One child- [name]    Family History  Problem Relation Age of Onset  . Diabetes Mother   . Hypertension Mother   . Stroke Brother        ?> 55  . Coronary artery disease Brother        stent in 22s  . Cancer Neg Hx     Review of Systems:  As stated in the HPI and otherwise  negative.   BP (!) 166/88   Pulse 81   Ht 5' 6"  (1.676 m)   Wt 226 lb 12.8 oz (102.9 kg)   SpO2 97%   BMI 36.61 kg/m   Physical Examination:  General: Well developed, well nourished, NAD  HEENT: OP clear, mucus membranes moist  SKIN: warm, dry. No rashes. Neuro: No focal deficits  Musculoskeletal: Muscle strength 5/5 all ext  Psychiatric: Mood and affect normal  Neck: No JVD, no carotid bruits, no thyromegaly, no lymphadenopathy.  Lungs:Clear bilaterally, no wheezes, rhonci, crackles Cardiovascular: Regular rate and rhythm. No murmurs, gallops or rubs. Abdomen:Soft. Bowel sounds present. Non-tender.  Extremities: No lower extremity edema. Pulses are 2 + in the bilateral DP/PT.  Echo 02/08/16: Left ventricle: The cavity size was normal. Wall thickness was   normal. Systolic function was mildly to moderately reduced. The   estimated ejection fraction was in the range of 40% to 45%. - Left atrium: The atrium was moderately dilated. - Tricuspid valve: There was moderate regurgitation. - Pulmonary arteries: PA peak pressure: 47 mm Hg (S).  EKG:  EKG is ordered today. The ekg ordered today demonstrates   Recent Labs: 07/09/2016: Pro B Natriuretic peptide (BNP) 341.0 02/04/2017: ALT 14; BUN 16; Creatinine, Ser 0.97; Hemoglobin 13.4; Platelets 246.0; Potassium 3.8; Sodium 141; TSH 4.34   Lipid Panel    Component Value Date/Time   CHOL 155 02/04/2017 1707   TRIG 76.0 02/04/2017 1707   HDL 44.00 02/04/2017  1707   CHOLHDL 4 02/04/2017 1707   VLDL 15.2 02/04/2017 1707   LDLCALC 95 02/04/2017 1707     Wt Readings from Last 3 Encounters:  02/20/17 226 lb 12.8 oz (102.9 kg)  02/04/17 229 lb (103.9 kg)  11/21/16 230 lb (104.3 kg)     Other studies Reviewed: Additional studies/ records that were reviewed today include: . Review of the above records demonstrates:   Assessment and Plan:   1. Chronic systolic CHF: Volume status appears to be ok today. Weight is stable. Continue Lasix 40 mg daily.    2. CAD without angina: She is s/p multivessel stenting in June 2017 after being found to have severe triple vessel CAD and not felt to be a candidate for CABG. Stents were placed in the ostial and mid RCA, proximal Circumflex, OM2 and first Diagonal. She is having no chest pain suggestive of angina. Will continue Plavix, statin and beta blocker.    3. Ischemic CM: LV systolic function improved post PCI, LVEF up to 45%. Will continue beta blocker, ARB and aldactone.    4. Persistent Atrial Fibrillation: Rate controlled. Continue Toprol and Eliquis.   5. HTN: BP controlled at home. She missed her meds this am. No changes today.   6. Hyperlipidemia: Will continue statin. LDL not at goal of 70. Will increase Lipitor to 80 mg daily and repeat lipids and LFTs in 12 weeks.   Current medicines are reviewed at length with the patient today.  The patient does not have concerns regarding medicines.  The following changes have been made:    Labs/ tests ordered today include:   Orders Placed This Encounter  Procedures  . Lipid Profile  . Hepatic function panel    Disposition:   FU with me in 6 months  Signed, Lauree Chandler, MD 02/20/2017 3:32 PM    [facility] [address], [place], [place]  [address] Phone: [phone]; Fax: [phone]

## 2017-02-20 NOTE — Patient Instructions (Signed)
Medication Instructions:  Your physician has recommended you make the following change in your medication:  Increase Atorvastatin to 80 mg by mouth daily.    Labwork: Your physician recommends that you return for lab work on March 7,2019.  This will be fasting.  The lab opens at 7:30 AM   Testing/Procedures: none  Follow-Up: Your physician recommends that you schedule a follow-up appointment in: 6 months. Please call our office in about 3 months to schedule this appointment   Any Other Special Instructions Will Be Listed Below (If Applicable).     If you need a refill on your cardiac medications before your next appointment, please call your pharmacy.

## 2017-02-28 DIAGNOSIS — H40053 Ocular hypertension, bilateral: Secondary | ICD-10-CM | POA: Diagnosis not present

## 2017-02-28 DIAGNOSIS — H401133 Primary open-angle glaucoma, bilateral, severe stage: Secondary | ICD-10-CM | POA: Diagnosis not present

## 2017-03-02 ENCOUNTER — Inpatient Hospital Stay (HOSPITAL_COMMUNITY)
Admission: EM | Admit: 2017-03-02 | Discharge: 2017-03-06 | DRG: 066 | Disposition: A | Discharge status: Rehabilitation Facility | Payer: Medicare HMO | Attending: Neurology | Admitting: Neurology

## 2017-03-02 ENCOUNTER — Inpatient Hospital Stay (HOSPITAL_COMMUNITY): Payer: Medicare HMO | Admitting: Anesthesiology

## 2017-03-02 ENCOUNTER — Emergency Department (HOSPITAL_COMMUNITY): Payer: Medicare HMO

## 2017-03-02 ENCOUNTER — Inpatient Hospital Stay (HOSPITAL_COMMUNITY): Payer: Medicare HMO

## 2017-03-02 ENCOUNTER — Encounter (HOSPITAL_COMMUNITY): Payer: Self-pay

## 2017-03-02 ENCOUNTER — Encounter (HOSPITAL_COMMUNITY)
Admission: EM | Disposition: A | Discharge status: Rehabilitation Facility | Payer: Self-pay | Source: Home / Self Care | Attending: Neurology

## 2017-03-02 DIAGNOSIS — I255 Ischemic cardiomyopathy: Secondary | ICD-10-CM | POA: Diagnosis present

## 2017-03-02 DIAGNOSIS — I63512 Cerebral infarction due to unspecified occlusion or stenosis of left middle cerebral artery: Secondary | ICD-10-CM | POA: Diagnosis not present

## 2017-03-02 DIAGNOSIS — R402362 Coma scale, best motor response, obeys commands, at arrival to emergency department: Secondary | ICD-10-CM | POA: Diagnosis present

## 2017-03-02 DIAGNOSIS — R4182 Altered mental status, unspecified: Secondary | ICD-10-CM | POA: Diagnosis not present

## 2017-03-02 DIAGNOSIS — Z9119 Patient's noncompliance with other medical treatment and regimen: Secondary | ICD-10-CM | POA: Diagnosis not present

## 2017-03-02 DIAGNOSIS — R29818 Other symptoms and signs involving the nervous system: Secondary | ICD-10-CM | POA: Diagnosis not present

## 2017-03-02 DIAGNOSIS — R402242 Coma scale, best verbal response, confused conversation, at arrival to emergency department: Secondary | ICD-10-CM | POA: Diagnosis not present

## 2017-03-02 DIAGNOSIS — Z7901 Long term (current) use of anticoagulants: Secondary | ICD-10-CM

## 2017-03-02 DIAGNOSIS — E876 Hypokalemia: Secondary | ICD-10-CM | POA: Diagnosis not present

## 2017-03-02 DIAGNOSIS — I10 Essential (primary) hypertension: Secondary | ICD-10-CM | POA: Diagnosis present

## 2017-03-02 DIAGNOSIS — N183 Chronic kidney disease, stage 3 unspecified: Secondary | ICD-10-CM

## 2017-03-02 DIAGNOSIS — Z8249 Family history of ischemic heart disease and other diseases of the circulatory system: Secondary | ICD-10-CM

## 2017-03-02 DIAGNOSIS — I517 Cardiomegaly: Secondary | ICD-10-CM | POA: Diagnosis not present

## 2017-03-02 DIAGNOSIS — I4891 Unspecified atrial fibrillation: Secondary | ICD-10-CM | POA: Diagnosis present

## 2017-03-02 DIAGNOSIS — R4701 Aphasia: Secondary | ICD-10-CM | POA: Diagnosis present

## 2017-03-02 DIAGNOSIS — I6602 Occlusion and stenosis of left middle cerebral artery: Secondary | ICD-10-CM | POA: Diagnosis not present

## 2017-03-02 DIAGNOSIS — R4702 Dysphasia: Secondary | ICD-10-CM | POA: Diagnosis present

## 2017-03-02 DIAGNOSIS — R29705 NIHSS score 5: Secondary | ICD-10-CM | POA: Diagnosis not present

## 2017-03-02 DIAGNOSIS — I63312 Cerebral infarction due to thrombosis of left middle cerebral artery: Secondary | ICD-10-CM | POA: Diagnosis not present

## 2017-03-02 DIAGNOSIS — I6522 Occlusion and stenosis of left carotid artery: Secondary | ICD-10-CM | POA: Diagnosis not present

## 2017-03-02 DIAGNOSIS — Z955 Presence of coronary angioplasty implant and graft: Secondary | ICD-10-CM

## 2017-03-02 DIAGNOSIS — E039 Hypothyroidism, unspecified: Secondary | ICD-10-CM | POA: Diagnosis present

## 2017-03-02 DIAGNOSIS — I659 Occlusion and stenosis of unspecified precerebral artery: Secondary | ICD-10-CM | POA: Diagnosis not present

## 2017-03-02 DIAGNOSIS — I2510 Atherosclerotic heart disease of native coronary artery without angina pectoris: Secondary | ICD-10-CM | POA: Diagnosis present

## 2017-03-02 DIAGNOSIS — I6523 Occlusion and stenosis of bilateral carotid arteries: Secondary | ICD-10-CM | POA: Diagnosis not present

## 2017-03-02 DIAGNOSIS — I252 Old myocardial infarction: Secondary | ICD-10-CM

## 2017-03-02 DIAGNOSIS — R2681 Unsteadiness on feet: Secondary | ICD-10-CM | POA: Diagnosis not present

## 2017-03-02 DIAGNOSIS — Z79899 Other long term (current) drug therapy: Secondary | ICD-10-CM

## 2017-03-02 DIAGNOSIS — I639 Cerebral infarction, unspecified: Secondary | ICD-10-CM | POA: Diagnosis not present

## 2017-03-02 DIAGNOSIS — Z79890 Hormone replacement therapy (postmenopausal): Secondary | ICD-10-CM

## 2017-03-02 DIAGNOSIS — Z7902 Long term (current) use of antithrombotics/antiplatelets: Secondary | ICD-10-CM

## 2017-03-02 DIAGNOSIS — R410 Disorientation, unspecified: Secondary | ICD-10-CM | POA: Diagnosis not present

## 2017-03-02 DIAGNOSIS — Z888 Allergy status to other drugs, medicaments and biological substances status: Secondary | ICD-10-CM

## 2017-03-02 DIAGNOSIS — I6350 Cerebral infarction due to unspecified occlusion or stenosis of unspecified cerebral artery: Secondary | ICD-10-CM | POA: Diagnosis not present

## 2017-03-02 DIAGNOSIS — I82409 Acute embolism and thrombosis of unspecified deep veins of unspecified lower extremity: Secondary | ICD-10-CM | POA: Diagnosis not present

## 2017-03-02 DIAGNOSIS — E785 Hyperlipidemia, unspecified: Secondary | ICD-10-CM | POA: Diagnosis present

## 2017-03-02 DIAGNOSIS — R402142 Coma scale, eyes open, spontaneous, at arrival to emergency department: Secondary | ICD-10-CM | POA: Diagnosis not present

## 2017-03-02 DIAGNOSIS — I6300 Cerebral infarction due to thrombosis of unspecified precerebral artery: Secondary | ICD-10-CM | POA: Diagnosis not present

## 2017-03-02 DIAGNOSIS — Z87891 Personal history of nicotine dependence: Secondary | ICD-10-CM | POA: Diagnosis not present

## 2017-03-02 DIAGNOSIS — R000 Tachycardia, unspecified: Secondary | ICD-10-CM | POA: Diagnosis not present

## 2017-03-02 DIAGNOSIS — I63412 Cerebral infarction due to embolism of left middle cerebral artery: Secondary | ICD-10-CM | POA: Diagnosis not present

## 2017-03-02 DIAGNOSIS — F4489 Other dissociative and conversion disorders: Secondary | ICD-10-CM | POA: Diagnosis not present

## 2017-03-02 DIAGNOSIS — I361 Nonrheumatic tricuspid (valve) insufficiency: Secondary | ICD-10-CM | POA: Diagnosis not present

## 2017-03-02 DIAGNOSIS — Z86718 Personal history of other venous thrombosis and embolism: Secondary | ICD-10-CM

## 2017-03-02 DIAGNOSIS — D72829 Elevated white blood cell count, unspecified: Secondary | ICD-10-CM | POA: Diagnosis not present

## 2017-03-02 DIAGNOSIS — I69320 Aphasia following cerebral infarction: Secondary | ICD-10-CM | POA: Diagnosis not present

## 2017-03-02 DIAGNOSIS — I129 Hypertensive chronic kidney disease with stage 1 through stage 4 chronic kidney disease, or unspecified chronic kidney disease: Secondary | ICD-10-CM | POA: Diagnosis not present

## 2017-03-02 DIAGNOSIS — I671 Cerebral aneurysm, nonruptured: Secondary | ICD-10-CM | POA: Diagnosis present

## 2017-03-02 DIAGNOSIS — I69398 Other sequelae of cerebral infarction: Secondary | ICD-10-CM | POA: Diagnosis not present

## 2017-03-02 DIAGNOSIS — Z91199 Patient's noncompliance with other medical treatment and regimen due to unspecified reason: Secondary | ICD-10-CM

## 2017-03-02 DIAGNOSIS — R8290 Unspecified abnormal findings in urine: Secondary | ICD-10-CM | POA: Diagnosis not present

## 2017-03-02 HISTORY — PX: IR ANGIO INTRA EXTRACRAN SEL COM CAROTID INNOMINATE BILAT MOD SED: IMG5360

## 2017-03-02 HISTORY — PX: RADIOLOGY WITH ANESTHESIA: SHX6223

## 2017-03-02 HISTORY — PX: IR ANGIO VERTEBRAL SEL VERTEBRAL UNI R MOD SED: IMG5368

## 2017-03-02 LAB — RAPID URINE DRUG SCREEN, HOSP PERFORMED
Amphetamines: NOT DETECTED
Barbiturates: NOT DETECTED
Benzodiazepines: NOT DETECTED
Cocaine: NOT DETECTED
Opiates: NOT DETECTED
Tetrahydrocannabinol: NOT DETECTED

## 2017-03-02 LAB — DIFFERENTIAL
Basophils Absolute: 0.2 10*3/uL — ABNORMAL HIGH (ref 0.0–0.1)
Basophils Relative: 2 %
Eosinophils Absolute: 0.2 10*3/uL (ref 0.0–0.7)
Eosinophils Relative: 2 %
Lymphocytes Relative: 27 %
Lymphs Abs: 2.7 10*3/uL (ref 0.7–4.0)
Monocytes Absolute: 0.6 10*3/uL (ref 0.1–1.0)
Monocytes Relative: 6 %
Neutro Abs: 6.3 10*3/uL (ref 1.7–7.7)
Neutrophils Relative %: 63 %

## 2017-03-02 LAB — I-STAT CHEM 8, ED
BUN: 14 mg/dL (ref 6–20)
Calcium, Ion: 1.1 mmol/L — ABNORMAL LOW (ref 1.15–1.40)
Chloride: 103 mmol/L (ref 101–111)
Creatinine, Ser: 1 mg/dL (ref 0.44–1.00)
Glucose, Bld: 115 mg/dL — ABNORMAL HIGH (ref 65–99)
HCT: 44 % (ref 36.0–46.0)
Hemoglobin: 15 g/dL (ref 12.0–15.0)
Potassium: 3.1 mmol/L — ABNORMAL LOW (ref 3.5–5.1)
Sodium: 144 mmol/L (ref 135–145)
TCO2: 25 mmol/L (ref 22–32)

## 2017-03-02 LAB — URINALYSIS, ROUTINE W REFLEX MICROSCOPIC
Bilirubin Urine: NEGATIVE
Glucose, UA: NEGATIVE mg/dL
Hgb urine dipstick: NEGATIVE
Ketones, ur: NEGATIVE mg/dL
Leukocytes, UA: NEGATIVE
Nitrite: NEGATIVE
Protein, ur: NEGATIVE mg/dL
Specific Gravity, Urine: 1.009 (ref 1.005–1.030)
pH: 5 (ref 5.0–8.0)

## 2017-03-02 LAB — COMPREHENSIVE METABOLIC PANEL
ALT: 19 U/L (ref 14–54)
AST: 28 U/L (ref 15–41)
Albumin: 4 g/dL (ref 3.5–5.0)
Alkaline Phosphatase: 116 U/L (ref 38–126)
Anion gap: 8 (ref 5–15)
BUN: 14 mg/dL (ref 6–20)
CO2: 27 mmol/L (ref 22–32)
Calcium: 9.1 mg/dL (ref 8.9–10.3)
Chloride: 104 mmol/L (ref 101–111)
Creatinine, Ser: 1.14 mg/dL — ABNORMAL HIGH (ref 0.44–1.00)
GFR calc Af Amer: 53 mL/min — ABNORMAL LOW (ref >60)
GFR calc non Af Amer: 46 mL/min — ABNORMAL LOW (ref >60)
Glucose, Bld: 115 mg/dL — ABNORMAL HIGH (ref 65–99)
Potassium: 3.1 mmol/L — ABNORMAL LOW (ref 3.5–5.1)
Sodium: 139 mmol/L (ref 135–145)
Total Bilirubin: 0.7 mg/dL (ref 0.3–1.2)
Total Protein: 7.1 g/dL (ref 6.5–8.1)

## 2017-03-02 LAB — CBC
HCT: 42.3 % (ref 36.0–46.0)
Hemoglobin: 13.8 g/dL (ref 12.0–15.0)
MCH: 31.4 pg (ref 26.0–34.0)
MCHC: 32.6 g/dL (ref 30.0–36.0)
MCV: 96.1 fL (ref 78.0–100.0)
Platelets: 257 10*3/uL (ref 150–400)
RBC: 4.4 MIL/uL (ref 3.87–5.11)
RDW: 13.1 % (ref 11.5–15.5)
WBC: 9.9 10*3/uL (ref 4.0–10.5)

## 2017-03-02 LAB — PROTIME-INR
INR: 1.09
Prothrombin Time: 14 seconds (ref 11.4–15.2)

## 2017-03-02 LAB — I-STAT TROPONIN, ED: Troponin i, poc: 0.01 ng/mL (ref 0.00–0.08)

## 2017-03-02 LAB — CBG MONITORING, ED: Glucose-Capillary: 106 mg/dL — ABNORMAL HIGH (ref 65–99)

## 2017-03-02 LAB — APTT: aPTT: 32 seconds (ref 24–36)

## 2017-03-02 SURGERY — RADIOLOGY WITH ANESTHESIA
Anesthesia: Monitor Anesthesia Care

## 2017-03-02 MED ORDER — SODIUM CHLORIDE 0.9 % IV SOLN: INTRAVENOUS | Status: DC

## 2017-03-02 MED ORDER — FENTANYL CITRATE (PF) 100 MCG/2ML IJ SOLN
INTRAMUSCULAR | Status: DC | PRN
  Administered 2017-03-02 (×2): 25 ug via INTRAVENOUS

## 2017-03-02 MED ORDER — IOPAMIDOL (ISOVUE-300) INJECTION 61%
INTRAVENOUS | Status: AC
  Administered 2017-03-02: 60 mL
  Filled 2017-03-02: qty 150

## 2017-03-02 MED ORDER — IOPAMIDOL (ISOVUE-370) INJECTION 76%
INTRAVENOUS | Status: AC
  Administered 2017-03-02: 50 mL
  Filled 2017-03-02: qty 50

## 2017-03-02 MED ORDER — ACETAMINOPHEN 160 MG/5ML PO SOLN: 650.0000 mg | ORAL | Status: DC | PRN

## 2017-03-02 MED ORDER — SODIUM CHLORIDE 0.9 % IV SOLN
INTRAVENOUS | Status: DC
  Administered 2017-03-03 – 2017-03-05 (×2): via INTRAVENOUS

## 2017-03-02 MED ORDER — CEFAZOLIN SODIUM-DEXTROSE 2-4 GM/100ML-% IV SOLN
INTRAVENOUS | Status: AC
  Filled 2017-03-02: qty 100

## 2017-03-02 MED ORDER — ACETAMINOPHEN 650 MG RE SUPP: 650.0000 mg | RECTAL | Status: DC | PRN

## 2017-03-02 MED ORDER — NICARDIPINE HCL IN NACL 20-0.86 MG/200ML-% IV SOLN
INTRAVENOUS | Status: DC | PRN
Start: 2017-03-02 — End: 2017-03-02
  Administered 2017-03-02: .5 mg/h via INTRAVENOUS

## 2017-03-02 MED ORDER — FENTANYL CITRATE (PF) 100 MCG/2ML IJ SOLN
INTRAMUSCULAR | Status: AC
  Filled 2017-03-02: qty 4

## 2017-03-02 MED ORDER — NITROGLYCERIN 1 MG/10 ML FOR IR/CATH LAB
INTRA_ARTERIAL | Status: AC
  Filled 2017-03-02: qty 10

## 2017-03-02 MED ORDER — HYDRALAZINE HCL 20 MG/ML IJ SOLN
INTRAMUSCULAR | Status: DC | PRN
  Administered 2017-03-02: 2.5 mg via INTRAVENOUS

## 2017-03-02 MED ORDER — SENNOSIDES-DOCUSATE SODIUM 8.6-50 MG PO TABS: 1.0000 | ORAL_TABLET | Freq: Every evening | ORAL | Status: DC | PRN

## 2017-03-02 MED ORDER — ACETAMINOPHEN 325 MG PO TABS: 650.0000 mg | ORAL_TABLET | ORAL | Status: DC | PRN

## 2017-03-02 MED ORDER — LACTATED RINGERS IV SOLN
INTRAVENOUS | Status: DC | PRN
  Administered 2017-03-02: 22:00:00 via INTRAVENOUS

## 2017-03-02 MED ORDER — LIDOCAINE HCL 1 % IJ SOLN
INTRAMUSCULAR | Status: AC
  Filled 2017-03-02: qty 20

## 2017-03-02 MED ORDER — HYDRALAZINE HCL 20 MG/ML IJ SOLN
INTRAMUSCULAR | Status: AC
  Filled 2017-03-02: qty 1

## 2017-03-02 MED ORDER — MIDAZOLAM HCL 2 MG/2ML IJ SOLN
INTRAMUSCULAR | Status: AC
  Filled 2017-03-02: qty 4

## 2017-03-02 MED ORDER — LIDOCAINE HCL (PF) 1 % IJ SOLN
INTRAMUSCULAR | Status: DC | PRN
  Administered 2017-03-02: 10 mL

## 2017-03-02 MED ORDER — STROKE: EARLY STAGES OF RECOVERY BOOK
Freq: Once | Status: AC
  Administered 2017-03-03
  Filled 2017-03-02: qty 1

## 2017-03-02 MED ORDER — IOPAMIDOL (ISOVUE-300) INJECTION 61%
INTRAVENOUS | Status: AC
  Filled 2017-03-02: qty 100

## 2017-03-02 NOTE — Anesthesia Preprocedure Evaluation (Addendum)
Anesthesia Evaluation  Patient identified by MRN, date of birth, ID band Patient confused    Reviewed: Allergy & Precautions, H&P , NPO status , Patient's Chart, lab work & pertinent test results, reviewed documented beta blocker date and time   Airway Mallampati: II  TM Distance: >3 FB Neck ROM: Full    Dental no notable dental hx. (+) Upper Dentures, Dental Advisory Given   Pulmonary neg pulmonary ROS, former smoker,    Pulmonary exam normal breath sounds clear to auscultation       Cardiovascular hypertension, Pt. on medications and Pt. on home beta blockers + CAD, + Past MI and + Peripheral Vascular Disease   Rhythm:Irregular Rate:Normal     Neuro/Psych CVA, Residual Symptoms negative psych ROS   GI/Hepatic negative GI ROS, Neg liver ROS,   Endo/Other  Hypothyroidism   Renal/GU negative Renal ROS  negative genitourinary   Musculoskeletal   Abdominal   Peds  Hematology negative hematology ROS (+)   Anesthesia Other Findings   Reproductive/Obstetrics negative OB ROS                            Anesthesia Physical Anesthesia Plan  ASA: III  Anesthesia Plan: General   Post-op Pain Management:    Induction: Intravenous, Rapid sequence and Cricoid pressure planned  PONV Risk Score and Plan: 3 and Ondansetron, Dexamethasone and Treatment may vary due to age or medical condition  Airway Management Planned: Oral ETT  Additional Equipment: Arterial line  Intra-op Plan:   Post-operative Plan: Post-operative intubation/ventilation  Informed Consent: I have reviewed the patients History and Physical, chart, labs and discussed the procedure including the risks, benefits and alternatives for the proposed anesthesia with the patient or authorized representative who has indicated his/her understanding and acceptance.   Dental advisory given  Plan Discussed with: CRNA  Anesthesia Plan  Comments:         Anesthesia Quick Evaluation

## 2017-03-02 NOTE — Procedures (Signed)
SUBJECTIVE:/P bilatera;l common carotid arteriogram. RT CFA approach. Findings. 1.Slowly recanalizing ant parietal branch of inf division  Of Lt MCA M2/M3 junction. 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

## 2017-03-02 NOTE — ED Notes (Signed)
[facility] contacted to activate Code Stroke 

## 2017-03-02 NOTE — Consult Note (Addendum)
Referring Physician: Dr. Reather Converse    Chief Complaint: Acute onset of expressive aphasia  HPI: Yvonne Bailey is an 76 y.o. female presenting to the ED via EMS with acute onset of expressive aphasia. There was question of confusion as well versus incorrect responses to questions due to dysphasia. LKN 1515. TOSO 1530. Per nursing note: "Pt A&O to person only.  Pt takes few minutes to try and answer questions then asked "what was there question again".   Pt has said several times "I am fine"."   The has a history of DVT and takes Eliquis. Also takes Plavix and atorvastatin.   PMHx includes CAD, prior MI, carotid artery disease, DVT, HTN, HLD, ischemic cardiomyopathy with low EF and PAD.   LSN: [id number] tPA Given: No: On oral anticoagulation NIHSS: 5  Past Medical History:  Diagnosis Date  . CAD (coronary artery disease)    S/P  anterior  wall myocardial infarction in '92, treated w/percutaneous transluminal coronary angioplasty of the left anterior descending. EF 45%  . Carotid artery disease ([place])   . DVT (deep venous thrombosis) ([place])    X1  . History of nuclear stress test    a. Myoview 6/17: EF 20-25%, mid anteroseptal, apical anterior, apical septal, apical inferior, apical lateral and apical scar, no ischemia, intermediate risk  . HTN (hypertension)   . Hyperlipidemia   . Hypothyroidism   . Ischemic cardiomyopathy    a. Echo 6/17: EF 20-25%, apex appears akinetic, MAC, moderate MR, moderate LAE, mild RVE, trivial PI, PASP 47 mmHg (needs repeat with Definity contrast) //  b. Limited echo with Definity contrast 7/17: EF 25-30%, moderate to severe LAE  . MI (myocardial infarction) ([place]) 1992   at age 48; TIA treated with coumadin until Plavix initiated in 2006  . PAD (peripheral artery disease) ([facility])    Right SFA occlusion, severe disease left CFA and SFA    Past Surgical History:  Procedure Laterality Date  . APPENDECTOMY     at hysterectomy and USO for fibroids, Dr. Ysidro Evert  .  CARDIAC CATHETERIZATION  1992   Dr Eustace Quail  . CARDIAC CATHETERIZATION N/A 09/06/2015   Procedure: Right/Left Heart Cath and Coronary Angiography;  Surgeon: Burnell Blanks, MD;  Location: [facility];  Service: Cardiovascular;  Laterality: N/A;  . CARDIAC CATHETERIZATION N/A 09/07/2015   Procedure: Coronary Stent Intervention;  Surgeon: Burnell Blanks, MD;  Location: [facility];  Service: Cardiovascular;  Laterality: N/A;  . COLONOSCOPY     negative; 2008, Dr. Delfin Edis  . fracture LLE     '94; pinned  . TONSILLECTOMY    . TOTAL ABDOMINAL HYSTERECTOMY     & BSO for fibroids    Family History  Problem Relation Age of Onset  . Diabetes Mother   . Hypertension Mother   . Stroke Brother        ?> 55  . Coronary artery disease Brother        stent in 42s  . Cancer Neg Hx    Social History:  reports that she quit smoking about 26 years ago. she has never used smokeless tobacco. She reports that she does not drink alcohol or use drugs.  Allergies:  Allergies  Allergen Reactions  . Definity [Perflutren Lipid Microsphere] Other (See Comments)    Patient experienced back pain with injection  . Cilostazol Other (See Comments)     Pletal :" head felt funny"    Home Medications:  ROS: Unable to obtain due to aphasia.   Physical Examination: Blood pressure 134/74, pulse 90, temperature 97.6 F (36.4 C), temperature source Oral, resp. rate 14, SpO2 100 %.  HEENT: [place]/AT Lungs: Respirations unlabored Ext: Warm and well-perfused  Neurologic Examination: Mental Status: Awake and alert. Dense expressive dysphasia with word finding difficulty and ungrammatical speech. Not able to follow all commands, but this improves with pantomiming and repetition. Will point to indicate which limb or side is affected during neurological exam.  Cranial Nerves: II:  Visual fields intact bilaterally without extinction to DSS. PERRL.  III,IV, VI: No ptosis.  EOMI  without nystagmus.  V,VII: Mild right facial droop. Decreased facial light touch sensation on the right. Positive for extinction to DSS.  VIII: hearing intact to voice IX,X: Unable to visualize palate/uvula XI: No definite asymmetry XII: midline tongue extension  Motor: Right : Upper extremity   5/5    Left:     Upper extremity   5/5  Lower extremity   5/5     Lower extremity   5/5 Normal tone throughout; no atrophy noted Sensory: Decreased temp and FT sensation to RUE and RLE.  Deep Tendon Reflexes:  Normoactive upper extremity and patellar reflexes without asymmetry Cerebellar: No ataxia with FNF bilaterally Gait: Deferred in the context of acuity of presentation  Results for orders placed or performed during the hospital encounter of 03/02/17 (from the past 48 hour(s))  CBG monitoring, ED     Status: Abnormal   Collection Time: 03/02/17  4:54 PM  Result Value Ref Range   Glucose-Capillary 106 (H) 65 - 99 mg/dL  Urine rapid drug screen (hosp performed)     Status: None   Collection Time: 03/02/17  5:07 PM  Result Value Ref Range   Opiates NONE DETECTED NONE DETECTED   Cocaine NONE DETECTED NONE DETECTED   Benzodiazepines NONE DETECTED NONE DETECTED   Amphetamines NONE DETECTED NONE DETECTED   Tetrahydrocannabinol NONE DETECTED NONE DETECTED   Barbiturates NONE DETECTED NONE DETECTED    Comment: (NOTE) DRUG SCREEN FOR MEDICAL PURPOSES ONLY.  IF CONFIRMATION IS NEEDED FOR ANY PURPOSE, NOTIFY LAB WITHIN 5 DAYS. LOWEST DETECTABLE LIMITS FOR URINE DRUG SCREEN Drug Class                     Cutoff (ng/mL) Amphetamine and metabolites    1000 Barbiturate and metabolites    200 Benzodiazepine                 973 Tricyclics and metabolites     300 Opiates and metabolites        300 Cocaine and metabolites        300 THC                            50   Urinalysis, Routine w reflex microscopic     Status: None   Collection Time: 03/02/17  5:07 PM  Result Value Ref Range    Color, Urine YELLOW YELLOW   APPearance CLEAR CLEAR   Specific Gravity, Urine 1.009 1.005 - 1.030   pH 5.0 5.0 - 8.0   Glucose, UA NEGATIVE NEGATIVE mg/dL   Hgb urine dipstick NEGATIVE NEGATIVE   Bilirubin Urine NEGATIVE NEGATIVE   Ketones, ur NEGATIVE NEGATIVE mg/dL   Protein, ur NEGATIVE NEGATIVE mg/dL   Nitrite NEGATIVE NEGATIVE   Leukocytes, UA NEGATIVE NEGATIVE  I-stat troponin, ED     Status: None  Collection Time: 03/02/17  5:15 PM  Result Value Ref Range   Troponin i, poc 0.01 0.00 - 0.08 ng/mL   Comment 3            Comment: Due to the release kinetics of cTnI, a negative result within the first hours of the onset of symptoms does not rule out myocardial infarction with certainty. If myocardial infarction is still suspected, repeat the test at appropriate intervals.   Comprehensive metabolic panel     Status: Abnormal   Collection Time: 03/02/17  5:16 PM  Result Value Ref Range   Sodium 139 135 - 145 mmol/L   Potassium 3.1 (L) 3.5 - 5.1 mmol/L   Chloride 104 101 - 111 mmol/L   CO2 27 22 - 32 mmol/L   Glucose, Bld 115 (H) 65 - 99 mg/dL   BUN 14 6 - 20 mg/dL   Creatinine, Ser 1.14 (H) 0.44 - 1.00 mg/dL   Calcium 9.1 8.9 - 10.3 mg/dL   Total Protein 7.1 6.5 - 8.1 g/dL   Albumin 4.0 3.5 - 5.0 g/dL   AST 28 15 - 41 U/L   ALT 19 14 - 54 U/L   Alkaline Phosphatase 116 38 - 126 U/L   Total Bilirubin 0.7 0.3 - 1.2 mg/dL   GFR calc non Af Amer 46 (L) >60 mL/min   GFR calc Af Amer 53 (L) >60 mL/min    Comment: (NOTE) The eGFR has been calculated using the CKD EPI equation. This calculation has not been validated in all clinical situations. eGFR's persistently <60 mL/min signify possible Chronic Kidney Disease.    Anion gap 8 5 - 15  CBC     Status: None   Collection Time: 03/02/17  5:16 PM  Result Value Ref Range   WBC 9.9 4.0 - 10.5 K/uL   RBC 4.40 3.87 - 5.11 MIL/uL   Hemoglobin 13.8 12.0 - 15.0 g/dL   HCT 42.3 36.0 - 46.0 %   MCV 96.1 78.0 - 100.0 fL   MCH  31.4 26.0 - 34.0 pg   MCHC 32.6 30.0 - 36.0 g/dL   RDW 13.1 11.5 - 15.5 %   Platelets 257 150 - 400 K/uL  Protime-INR     Status: None   Collection Time: 03/02/17  5:16 PM  Result Value Ref Range   Prothrombin Time 14.0 11.4 - 15.2 seconds   INR 1.09   APTT     Status: None   Collection Time: 03/02/17  5:16 PM  Result Value Ref Range   aPTT 32 24 - 36 seconds  Differential     Status: Abnormal   Collection Time: 03/02/17  5:16 PM  Result Value Ref Range   Neutrophils Relative % 63 %   Neutro Abs 6.3 1.7 - 7.7 K/uL   Lymphocytes Relative 27 %   Lymphs Abs 2.7 0.7 - 4.0 K/uL   Monocytes Relative 6 %   Monocytes Absolute 0.6 0.1 - 1.0 K/uL   Eosinophils Relative 2 %   Eosinophils Absolute 0.2 0.0 - 0.7 K/uL   Basophils Relative 2 %   Basophils Absolute 0.2 (H) 0.0 - 0.1 K/uL  I-Stat Chem 8, ED     Status: Abnormal   Collection Time: 03/02/17  5:17 PM  Result Value Ref Range   Sodium 144 135 - 145 mmol/L   Potassium 3.1 (L) 3.5 - 5.1 mmol/L   Chloride 103 101 - 111 mmol/L   BUN 14 6 - 20 mg/dL   Creatinine,  Ser 1.00 0.44 - 1.00 mg/dL   Glucose, Bld 115 (H) 65 - 99 mg/dL   Calcium, Ion 1.10 (L) 1.15 - 1.40 mmol/L   TCO2 25 22 - 32 mmol/L   Hemoglobin 15.0 12.0 - 15.0 g/dL   HCT 44.0 36.0 - 46.0 %   Ct Head Code Stroke Wo Contrast  Result Date: 03/02/2017 CLINICAL DATA:  Code stroke. Initial evaluation for acute altered mental status. Expressive aphasia. EXAM: CT HEAD WITHOUT CONTRAST TECHNIQUE: Contiguous axial images were obtained from the base of the skull through the vertex without intravenous contrast. COMPARISON:  Prior MRI from 09/02/2012. FINDINGS: Brain: Generalized age related cerebral atrophy. Patchy and confluent hypodensity within the periventricular and deep white matter both cerebral hemispheres most consistent with chronic small vessel ischemic disease, moderate nature. Prominent remote right basal ganglia lacunar infarct. Remote infarcts within the overlying  right frontal and insular cortices. Additional smaller left basal ganglia/corona radiata lacunar infarct noted as well. Multiple bilateral remote cerebellar infarcts. Changes are similar to previous. No acute intracranial hemorrhage. No acute large vessel territory infarct. No mass lesion or midline shift. Ventricular prominence related global parenchymal volume loss of hydrocephalus. No extra-axial fluid collection. 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 Vascular: No asymmetric hyperdense vessel. Scattered vascular calcifications noted within the carotid siphons. Skull: Scalp soft tissues and calvarium within normal limits. Sinuses/Orbits: Globes and orbital soft tissues normal. Other: Paranasal sinuses and mastoid air cells are clear. Scattered gas lucency within the cavernous sinus and venous system of the face, like related IV access. ASPECTS [facility] Stroke Program Early CT Score) - Ganglionic level infarction (caudate, lentiform nuclei, internal capsule, insula, M1-M3 cortex): 7 - Supraganglionic infarction (M4-M6 cortex): 3 Total score (0-10 with 10 being normal): 10 IMPRESSION: 1. No acute intracranial infarct or other process identified. 2. ASPECTS is 10. 3. Multiple remote lacunar infarcts involving the bilateral basal ganglia, right cerebral hemisphere, and bilateral cerebellar hemispheres as above. 4. Atrophy with chronic small vessel ischemic disease. 5. 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 These results were communicated to Dr. Cheral Marker At 5:45 pmon 12/23/2018by text page via the [facility] messaging system. Electronically Signed   By: Jeannine Boga M.D.   On: 03/02/2017 17:47   CTA head and neck:  IMPRESSION: 1. Loss of flow related enhancement of a proximal left M2 branch, suspicious for occlusion. 2. Otherwise negative CTA for large vessel occlusion. 3. Occluded left vertebral artery. Dominant right vertebral artery patent  to the vertebrobasilar junction. 4. Moderate carotid siphon atherosclerosis with moderate multifocal stenoses. 5. Carotid bifurcation atherosclerosis without flow-limiting stenosis.  Assessment: 76 y.o. female presenting with acute onset of expressive aphasia 1. Exam localizes to left frontal operculum (Broca's area). There is also sensory deficit on the right with extinction, suggestive of a second localization to the left parietal lobe. Overall presentation most consistent with acute ischemic stroke.  2. CT head: No acute intracranial infarct or other process identified. ASPECTS is 10. Multiple remote lacunar infarcts involving the bilateral basal ganglia, right cerebral hemisphere, and bilateral cerebellar hemispheres as above. Atrophy with chronic small vessel ischemic disease.  3. Stroke Risk Factors - CAD, prior MI, carotid artery disease, DVT, HTN, HLD, ischemic cardiomyopathy with low EF and PAD. 4. CTA of head and neck: Loss of flow related enhancement of a proximal left M2 branch, suspicious for occlusion. 5. Also noted on CTA is occluded left vertebral artery which may be chronic in the context of dominant  right vertebral artery patent to the vertebrobasilar junction. There is moderate carotid siphon atherosclerosis with moderate multifocal stenoses. More proximally, there is carotid bifurcation atherosclerosis without flow-limiting stenosis.  Plan: 1. Not a tPA candidate due to anticoagulation with Eliquis.  2. Discussed case with Dr. Estanislado Pandy. He will review images to determine if he can access the occlusion with catheter.  3. IVF  4. Full stroke work up following decision to perform or not to perform endovascular procedure 5. Discussed with patient's relative who tentatively consents to possible endovascular procedure. The patient is not able to provide consent due to aphasia.  6. Discussed with Dr. Reather Converse.  40 minutes spent in the emergent Neurological evaluation and management  of this acute stroke patient.   _0  signed: Dr. Kerney Elbe 03/02/2017, 6:36 PM

## 2017-03-02 NOTE — Transfer of Care (Signed)
Immediate Anesthesia Transfer of Care Note  Patient: Yvonne Bailey  Procedure(s) Performed: RADIOLOGY WITH ANESTHESIA (N/A )  Patient Location: PACU  Anesthesia Type:MAC  Level of Consciousness: awake  Airway & Oxygen Therapy: Patient Spontanous Breathing  Post-op Assessment: Report given to RN and Post -op Vital signs reviewed and stable  Post vital signs: Reviewed and stable  Last Vitals:  Vitals:   03/02/17 2047 03/02/17 2100  BP:  (!) 194/85  Pulse: 88 84  Resp: 16   Temp:    SpO2: 96% 100%    Last Pain:  Vitals:   03/02/17 2110  TempSrc:   PainSc: 0-No pain         Complications: No apparent anesthesia complications

## 2017-03-02 NOTE — ED Provider Notes (Signed)
[facility]GICAL ICU Provider Note   CSN: [id number] Arrival date & time: 03/02/17  1638     History   Chief Complaint Chief Complaint  Patient presents with  . Altered Mental Status    HPI Yvonne Bailey is a 76 y.o. female.  Patient with history of coronary artery disease, DVT, on our questions, ischemic cardiopathy, vascular disease, heart attack presents with altered mental status. At 3:15 this afternoon patient developed difficulty with speech and confusion. No unilateral deficits. Past smoker. Difficulty obtaining details from patient due to aphasia.      Past Medical History:  Diagnosis Date  . CAD (coronary artery disease)    S/P  anterior  wall myocardial infarction in '92, treated w/percutaneous transluminal coronary angioplasty of the left anterior descending. EF 45%  . Carotid artery disease ([place])   . DVT (deep venous thrombosis) ([place])    X1  . History of nuclear stress test    a. Myoview 6/17: EF 20-25%, mid anteroseptal, apical anterior, apical septal, apical inferior, apical lateral and apical scar, no ischemia, intermediate risk  . HTN (hypertension)   . Hyperlipidemia   . Hypothyroidism   . Ischemic cardiomyopathy    a. Echo 6/17: EF 20-25%, apex appears akinetic, MAC, moderate MR, moderate LAE, mild RVE, trivial PI, PASP 47 mmHg (needs repeat with Definity contrast) //  b. Limited echo with Definity contrast 7/17: EF 25-30%, moderate to severe LAE  . MI (myocardial infarction) ([place]) 1992   at age 27; TIA treated with coumadin until Plavix initiated in 2006  . PAD (peripheral artery disease) ([facility])    Right SFA occlusion, severe disease left CFA and SFA    Patient Active Problem List   Diagnosis Date Noted  . 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 Prediabetes 07/28/2016  . DOE (dyspnea on exertion) 07/09/2016  . Gait instability 07/09/2016  . Itching 04/16/2016  . Unstable angina ([place]) 09/06/2015  . Cardiomyopathy, ischemic     . Chronic systolic heart failure ([place]) 08/21/2015  . Bilateral leg edema 07/19/2015  . Atrial fibrillation ([place]) 07/19/2015  . Urinary urgency 03/30/2015  . Other musculoskeletal symptoms referable to limbs(729.89) 12/17/2012  . Lacunar infarction 12/17/2012  . Small vessel disease, cerebrovascular 12/17/2012  . CAROTID BRUIT, RIGHT 08/10/2008  . Peripheral vascular disease ([place]) 06/04/2007  . Diverticulosis of large intestine 06/04/2007  . Hypothyroidism 02/24/2007  . Hyperlipidemia 02/24/2007  . Essential hypertension 02/24/2007  . DVT 01/21/2007  . Coronary atherosclerosis 10/29/2006    Past Surgical History:  Procedure Laterality Date  . APPENDECTOMY     at hysterectomy and USO for fibroids, Dr. Ysidro Evert  . CARDIAC CATHETERIZATION  1992   Dr Eustace Quail  . CARDIAC CATHETERIZATION N/A 09/06/2015   Procedure: Right/Left Heart Cath and Coronary Angiography;  Surgeon: Burnell Blanks, MD;  Location: [facility];  Service: Cardiovascular;  Laterality: N/A;  . CARDIAC CATHETERIZATION N/A 09/07/2015   Procedure: Coronary Stent Intervention;  Surgeon: Burnell Blanks, MD;  Location: [facility];  Service: Cardiovascular;  Laterality: N/A;  . COLONOSCOPY     negative; 2008, Dr. Delfin Edis  . fracture LLE     '94; pinned  . TONSILLECTOMY    . TOTAL ABDOMINAL HYSTERECTOMY     & BSO for fibroids    OB History    No data available       Home Medications    Prior to Admission medications   Medication Sig Start Date End Date Taking?  Authorizing Provider  apixaban (ELIQUIS) 5 MG TABS tablet Take 1 tablet (5 mg total) by mouth 2 (two) times daily. 02/12/16  Yes Burnell Blanks, MD  atorvastatin (LIPITOR) 80 MG tablet Take 1 tablet (80 mg total) by mouth daily. 02/20/17 05/21/17 Yes Burnell Blanks, MD  benazepril (LOTENSIN) 20 MG tablet TAKE 1 TABLET EVERY DAY Patient taking differently: TAKE 1 TABLET (52m) EVERY DAY 10/09/16  Yes Burns,  SClaudina Lick MD  clopidogrel (PLAVIX) 75 MG tablet TAKE 1 TABLET EVERY DAY Patient taking differently: TAKE 1 TABLET (723m EVERY DAY 12/11/16  Yes McBurnell BlanksMD  metoprolol succinate (TOPROL-XL) 50 MG 24 hr tablet Take 1 tablet (50 mg total) by mouth 2 (two) times daily. Take with or immediately following a meal. 02/12/16  Yes McBurnell BlanksMD  spironolactone (ALDACTONE) 25 MG tablet Take 1 tablet (25 mg total) by mouth daily. 12/26/16  Yes McBurnell BlanksMD  amLODipine (NORVASC) 2.5 MG tablet Take 1 tablet (2.5 mg total) by mouth daily. 07/09/16   BuBinnie RailMD  brimonidine (ALPHAGAN) 0.2 % ophthalmic solution PLACE ONE DROPTO THE LEFT EYE TWICE  DAILY 06/07/15   [provider]  cholecalciferol (VITAMIN D) 1000 UNITS tablet Take 1,000 Units by mouth daily.    [provider]  fish oil-omega-3 fatty acids 1000 MG capsule Take 1,000 mg by mouth daily.     [provider]  furosemide (LASIX) 40 MG tablet Take 1 tablet (40 mg total) by mouth daily. 02/12/16 05/12/16  McBurnell BlanksMD  latanoprost (XALATAN) 0.005 % ophthalmic solution Place 0.005 drops into both eyes every evening. 06/17/14   [provider]  levothyroxine (SYNTHROID, LEVOTHROID) 112 MCG tablet Take 1 tablet (112 mcg total) by mouth daily. 07/18/16   BuBinnie RailMD  Multiple Vitamin (MULTIVITAMIN) tablet Take 1 tablet by mouth daily.      [provider]  nitroGLYCERIN (NITROSTAT) 0.4 MG SL tablet Place 1 tablet (0.4 mg total) under the tongue every 5 (five) minutes as needed for chest pain. 09/08/15   SmArbutus LeasNP  Potassium Chloride ER 20 MEQ TBCR Take 20 mEq by mouth daily. 08/08/16   McBurnell BlanksMD    Family History Family History  Problem Relation Age of Onset  . Diabetes Mother   . Hypertension Mother   . Stroke Brother        ?> 55  . Coronary artery disease Brother        stent in 6052s. Cancer Neg Hx     Social  History Social History   Tobacco Use  . Smoking status: Former Smoker    Last attempt to quit: 03/11/1990    Years since quitting: 26.9  . Smokeless tobacco: Never Used  . Tobacco comment: smoked [place] to < 5 cigarettes  Substance Use Topics  . Alcohol use: No  . Drug use: No     Allergies   Definity [perflutren lipid microsphere] and Cilostazol   Review of Systems Review of Systems  Unable to perform ROS: Mental status change     Physical Exam Updated Vital Signs BP (!) 145/71   Pulse 84   Temp 97.7 F (36.5 C)   Resp 16   SpO2 100%   Physical Exam  Constitutional: She appears well-developed and well-nourished.  HENT:  Head: Normocephalic and atraumatic.  Eyes: Conjunctivae are normal. Right eye exhibits no discharge. Left eye exhibits no discharge.  Neck: Normal range of motion. Neck supple. No tracheal deviation present.  Cardiovascular: Normal rate and regular rhythm.  Pulmonary/Chest: Effort normal and breath sounds normal.  Abdominal: Soft. She exhibits no distension. There is no tenderness. There is no guarding.  Musculoskeletal: She exhibits no edema.  Neurological: She is alert. A cranial nerve deficit is present. GCS eye subscore is 4. GCS verbal subscore is 4. GCS motor subscore is 6.  Patient has expressive and mild receptive aphasia and mild difficulty following commands. Patient has 5+ strength bilateral. No obvious arm drift. Patient had difficulty following commands for finger-nose. Patient alert to voice.  Skin: Skin is warm. No rash noted.  Psychiatric: She has a normal mood and affect.  Nursing note and vitals reviewed.    ED Treatments / Results  Labs (all labs ordered are listed, but only abnormal results are displayed) Labs Reviewed  COMPREHENSIVE METABOLIC PANEL - Abnormal; Notable for the following components:      Result Value   Potassium 3.1 (*)    Glucose, Bld 115 (*)    Creatinine, Ser 1.14 (*)    GFR calc non Af Amer 46  (*)    GFR calc Af Amer 53 (*)    All other components within normal limits  DIFFERENTIAL - Abnormal; Notable for the following components:   Basophils Absolute 0.2 (*)    All other components within normal limits  CBG MONITORING, ED - Abnormal; Notable for the following components:   Glucose-Capillary 106 (*)    All other components within normal limits  I-STAT CHEM 8, ED - Abnormal; Notable for the following components:   Potassium 3.1 (*)    Glucose, Bld 115 (*)    Calcium, Ion 1.10 (*)    All other components within normal limits  CBC  PROTIME-INR  APTT  RAPID URINE DRUG SCREEN, HOSP PERFORMED  URINALYSIS, ROUTINE W REFLEX MICROSCOPIC  ETHANOL  HEMOGLOBIN A1C  LIPID PANEL  I-STAT TROPONIN, ED    EKG  EKG Interpretation None       Radiology Ct Angio Head W Or Wo Contrast  Result Date: 03/02/2017 CLINICAL DATA:  Initial evaluation for acute expressive aphasia, confusion. Right as this can contact me in a gait signal 9 he had 7 1 and is in the EXAM: CT ANGIOGRAPHY HEAD AND NECK TECHNIQUE: Multidetector CT imaging of the head and neck was performed using the standard protocol during bolus administration of intravenous contrast. Multiplanar CT image reconstructions and MIPs were obtained to evaluate the vascular anatomy. Carotid stenosis measurements (when applicable) are obtained utilizing NASCET criteria, using the distal internal carotid diameter as the denominator. CONTRAST:  13m ISOVUE-370 IOPAMIDOL (ISOVUE-370) INJECTION 76% COMPARISON:  Prior CT from earlier same day. FINDINGS: CTA NECK FINDINGS Aortic arch: Visualized aortic arch of normal caliber with normal branch pattern. Atheromatous plaque within the arch and about the origin of the great vessels without flow-limiting stenosis. Partially visualized subclavian artery is patent without stenosis. Right carotid system: Right common carotid artery patent from its origin to the bifurcation without flow-limiting stenosis.  Scattered calcified plaque about the right bifurcation without high-grade stenosis. Right ICA patent distally to the skullbase without stenosis, dissection, or occlusion. Left carotid system: Left common carotid artery patent from its origin to the bifurcation without stenosis. Mild atheromatous plaque about the left bifurcation without flow-limiting stenosis. Left ICA patent distally in to the skullbase without stenosis, dissection, or occlusion. Vertebral arteries: Right vertebral artery arises from the subclavian artery. Focal plaque at the  origin of the right vertebral artery with mild stenosis. Right vertebral artery patent distally to the skullbase without stenosis, dissection, or occlusion. Left vertebral artery is largely occluded within the neck. Skeleton: No acute osseous abnormality. No worrisome lytic or blastic osseous lesions. Other neck: No acute soft tissue abnormality within the neck. No adenopathy. Thyroid is hypoplastic and/ or absent. Upper chest: Visualized upper chest within normal limits. Partially visualized lungs are clear. Review of the MIP images confirms the above findings CTA HEAD FINDINGS Anterior circulation: Petrous segments widely patent bilaterally. Scattered calcified plaque throughout the cavernous/ supraclinoid segments with moderate multifocal narrowing. ICA termini patent. A1 segments patent. Anterior cerebral arteries patent to their distal aspects without stenosis. Left M1 segment patent without stenosis. Normal left MCA bifurcation. There is loss of flow related enhancement within a proximal left M2 branch, suspicious for occlusion (series 7, image 85). No definite flow seen distally. Left MCA branches otherwise perfused. Right M1 segment patent without stenosis or occlusion. Normal right MCA bifurcation. No proximal right M2 occlusion. Distal right MCA branches well perfused. Posterior circulation: Dominant right vertebral artery patent to the vertebrobasilar junction  without flow-limiting stenosis. Attenuated opacification of the left V4 segment, likely via retrograde flow from the basilar. Right PICA patent. Left PICA not visualized. Short-segment mild stenosis within the mid basilar artery. Basilar overall diminutive in nature. Superior cerebral arteries patent bilaterally. Fetal type origin of the left PCA supplied via a left posterior communicating artery. Right PCA supplied via the basilar. PCAs patent to their distal aspects with scattered atheromatous irregularity without high-grade stenosis. Venous sinuses: Grossly patent, although not well evaluated due to arterial timing of the contrast bolus. Anatomic variants: None significant.  No aneurysm. Delayed phase: Not performed. Review of the MIP images confirms the above findings IMPRESSION: 1. Loss of flow related enhancement of a proximal left M2 branch, suspicious for occlusion. 2. Otherwise negative CTA for large vessel occlusion. 3. Occluded left vertebral artery. Dominant right vertebral artery patent to the vertebrobasilar junction. 4. Moderate carotid siphon atherosclerosis with moderate multifocal stenoses. 5. Carotid bifurcation atherosclerosis without flow-limiting stenosis. Critical Value/emergent results were called by telephone at the time of interpretation on 03/02/2017 at 6:06 pm to Dr. Kerney Elbe , who verbally acknowledged these results. Electronically Signed   By: Jeannine Boga M.D.   On: 03/02/2017 18:49   Ct Angio Neck W Or Wo Contrast  Result Date: 03/02/2017 CLINICAL DATA:  Initial evaluation for acute expressive aphasia, confusion. Right as this can contact me in a gait signal 9 he had 7 1 and is in the EXAM: CT ANGIOGRAPHY HEAD AND NECK TECHNIQUE: Multidetector CT imaging of the head and neck was performed using the standard protocol during bolus administration of intravenous contrast. Multiplanar CT image reconstructions and MIPs were obtained to evaluate the vascular anatomy. Carotid  stenosis measurements (when applicable) are obtained utilizing NASCET criteria, using the distal internal carotid diameter as the denominator. CONTRAST:  48m ISOVUE-370 IOPAMIDOL (ISOVUE-370) INJECTION 76% COMPARISON:  Prior CT from earlier same day. FINDINGS: CTA NECK FINDINGS Aortic arch: Visualized aortic arch of normal caliber with normal branch pattern. Atheromatous plaque within the arch and about the origin of the great vessels without flow-limiting stenosis. Partially visualized subclavian artery is patent without stenosis. Right carotid system: Right common carotid artery patent from its origin to the bifurcation without flow-limiting stenosis. Scattered calcified plaque about the right bifurcation without high-grade stenosis. Right ICA patent distally to the skullbase without stenosis, dissection, or occlusion. Left  carotid system: Left common carotid artery patent from its origin to the bifurcation without stenosis. Mild atheromatous plaque about the left bifurcation without flow-limiting stenosis. Left ICA patent distally in to the skullbase without stenosis, dissection, or occlusion. Vertebral arteries: Right vertebral artery arises from the subclavian artery. Focal plaque at the origin of the right vertebral artery with mild stenosis. Right vertebral artery patent distally to the skullbase without stenosis, dissection, or occlusion. Left vertebral artery is largely occluded within the neck. Skeleton: No acute osseous abnormality. No worrisome lytic or blastic osseous lesions. Other neck: No acute soft tissue abnormality within the neck. No adenopathy. Thyroid is hypoplastic and/ or absent. Upper chest: Visualized upper chest within normal limits. Partially visualized lungs are clear. Review of the MIP images confirms the above findings CTA HEAD FINDINGS Anterior circulation: Petrous segments widely patent bilaterally. Scattered calcified plaque throughout the cavernous/ supraclinoid segments with  moderate multifocal narrowing. ICA termini patent. A1 segments patent. Anterior cerebral arteries patent to their distal aspects without stenosis. Left M1 segment patent without stenosis. Normal left MCA bifurcation. There is loss of flow related enhancement within a proximal left M2 branch, suspicious for occlusion (series 7, image 85). No definite flow seen distally. Left MCA branches otherwise perfused. Right M1 segment patent without stenosis or occlusion. Normal right MCA bifurcation. No proximal right M2 occlusion. Distal right MCA branches well perfused. Posterior circulation: Dominant right vertebral artery patent to the vertebrobasilar junction without flow-limiting stenosis. Attenuated opacification of the left V4 segment, likely via retrograde flow from the basilar. Right PICA patent. Left PICA not visualized. Short-segment mild stenosis within the mid basilar artery. Basilar overall diminutive in nature. Superior cerebral arteries patent bilaterally. Fetal type origin of the left PCA supplied via a left posterior communicating artery. Right PCA supplied via the basilar. PCAs patent to their distal aspects with scattered atheromatous irregularity without high-grade stenosis. Venous sinuses: Grossly patent, although not well evaluated due to arterial timing of the contrast bolus. Anatomic variants: None significant.  No aneurysm. Delayed phase: Not performed. Review of the MIP images confirms the above findings IMPRESSION: 1. Loss of flow related enhancement of a proximal left M2 branch, suspicious for occlusion. 2. Otherwise negative CTA for large vessel occlusion. 3. Occluded left vertebral artery. Dominant right vertebral artery patent to the vertebrobasilar junction. 4. Moderate carotid siphon atherosclerosis with moderate multifocal stenoses. 5. Carotid bifurcation atherosclerosis without flow-limiting stenosis. Critical Value/emergent results were called by telephone at the time of interpretation on  03/02/2017 at 6:06 pm to Dr. Kerney Elbe , who verbally acknowledged these results. Electronically Signed   By: Jeannine Boga M.D.   On: 03/02/2017 18:49   Ct Cerebral Perfusion W Contrast  Result Date: 03/02/2017 CLINICAL DATA:  Code stroke.  Confusion. EXAM: CT PERFUSION BRAIN TECHNIQUE: Multiphase CT imaging of the brain was performed following IV bolus contrast injection. Subsequent parametric perfusion maps were calculated using RAPID software. CONTRAST:  54m ISOVUE-370 IOPAMIDOL (ISOVUE-370) INJECTION 76% COMPARISON:  CT head without contrast and CTA head neck earlier in day. FINDINGS: CT Brain Perfusion Findings: CBF (<30%) Volume: 010mPerfusion (Tmax>6.0s) volume: 2440mismatch Volume: 62m78mfarction Location:LEFT frontal cortex and subcortical white matter, LEFT MCA territory. IMPRESSION: Perfusion mismatch volume of 24 mL, corresponding to CT abnormality of the LEFT MCA branch subserving the LEFT frontal cortex and subcortical white matter. No core infarct volume is established. These results were called by telephone at the time of interpretation on 03/02/2017 at 8:16 pm to Dr. SUSHSamara Snide  who verbally acknowledged these results. Electronically Signed   By: Staci Righter M.D.   On: 03/02/2017 20:17   Ct Head Code Stroke Wo Contrast  Result Date: 03/02/2017 CLINICAL DATA:  Code stroke. Initial evaluation for acute altered mental status. Expressive aphasia. EXAM: CT HEAD WITHOUT CONTRAST TECHNIQUE: Contiguous axial images were obtained from the base of the skull through the vertex without intravenous contrast. COMPARISON:  Prior MRI from 09/02/2012. FINDINGS: Brain: Generalized age related cerebral atrophy. Patchy and confluent hypodensity within the periventricular and deep white matter both cerebral hemispheres most consistent with chronic small vessel ischemic disease, moderate nature. Prominent remote right basal ganglia lacunar infarct. Remote infarcts within the overlying right  frontal and insular cortices. Additional smaller left basal ganglia/corona radiata lacunar infarct noted as well. Multiple bilateral remote cerebellar infarcts. Changes are similar to previous. No acute intracranial hemorrhage. No acute large vessel territory infarct. No mass lesion or midline shift. Ventricular prominence related global parenchymal volume loss of hydrocephalus. No extra-axial fluid collection. 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 Vascular: No asymmetric hyperdense vessel. Scattered vascular calcifications noted within the carotid siphons. Skull: Scalp soft tissues and calvarium within normal limits. Sinuses/Orbits: Globes and orbital soft tissues normal. Other: Paranasal sinuses and mastoid air cells are clear. Scattered gas lucency within the cavernous sinus and venous system of the face, like related IV access. ASPECTS [facility] Stroke Program Early CT Score) - Ganglionic level infarction (caudate, lentiform nuclei, internal capsule, insula, M1-M3 cortex): 7 - Supraganglionic infarction (M4-M6 cortex): 3 Total score (0-10 with 10 being normal): 10 IMPRESSION: 1. No acute intracranial infarct or other process identified. 2. ASPECTS is 10. 3. Multiple remote lacunar infarcts involving the bilateral basal ganglia, right cerebral hemisphere, and bilateral cerebellar hemispheres as above. 4. Atrophy with chronic small vessel ischemic disease. 5. 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 These results were communicated to Dr. Cheral Marker At 5:45 pmon 12/23/2018by text page via the [facility] messaging system. Electronically Signed   By: Jeannine Boga M.D.   On: 03/02/2017 17:47    Procedures .Critical Care Performed by: Elnora Morrison, MD Authorized by: Elnora Morrison, MD   Critical care provider statement:    Critical care time (minutes):  40   Critical care start time:  03/02/2017 4:45 PM   Critical care end time:  02/23/2017  5:20 PM   Critical care time was exclusive of:  Separately billable procedures and treating other patients and teaching time   Critical care was necessary to treat or prevent imminent or life-threatening deterioration of the following conditions:  CNS failure or compromise   Critical care was time spent personally by me on the following activities:  Examination of patient, re-evaluation of patient's condition, ordering and review of laboratory studies and review of old charts   I assumed direction of critical care for this patient from another provider in my specialty: no     (including critical care time)  Medications Ordered in ED Medications  0.9 %  sodium chloride infusion (not administered)   stroke: mapping our early stages of recovery book (not administered)  0.9 %  sodium chloride infusion (not administered)  acetaminophen (TYLENOL) tablet 650 mg (not administered)    Or  acetaminophen (TYLENOL) solution 650 mg (not administered)    Or  acetaminophen (TYLENOL) suppository 650 mg (not administered)  senna-docusate (Senokot-S) tablet 1 tablet (not administered)  nitroGLYCERIN 100 MCG/ML intra-arterial injection (not administered)  iopamidol (ISOVUE-300) 61 % injection (  not administered)  lidocaine (XYLOCAINE) 1 % (with pres) injection (not administered)  lidocaine (PF) (XYLOCAINE) 1 % injection (10 mLs Infiltration Given 03/02/17 2211)  0.9 %  sodium chloride infusion (not administered)  iopamidol (ISOVUE-370) 76 % injection (50 mLs  Contrast Given 03/02/17 1745)  iopamidol (ISOVUE-370) 76 % injection (50 mLs  Contrast Given 03/02/17 1948)  iopamidol (ISOVUE-300) 61 % injection (60 mLs  Contrast Given 03/02/17 2241)  fentaNYL (SUBLIMAZE) 100 MCG/2ML injection (  Override pull for Anesthesia 03/02/17 2211)  hydrALAZINE (APRESOLINE) 20 MG/ML injection (  Override pull for Anesthesia 03/02/17 2213)     Initial Impression / Assessment and Plan / ED Course  I have reviewed the  triage vital signs and the nursing notes.  Pertinent labs & imaging results that were available during my care of the patient were reviewed by me and considered in my medical decision making (see chart for details).    Patient with vascular disease history presents with clinical concern for stroke either hemorrhagic versus thrombotic. Patient is on anticoagulant. Code stroke called this patient is within a time window. Neurology consult.  Neuro agrees with acute stroke, considering angio by specialist.   The patients results and plan were reviewed and discussed.   Any x-rays performed were independently reviewed by myself.   Differential diagnosis were considered with the presenting HPI.  Medications  0.9 %  sodium chloride infusion (not administered)   stroke: mapping our early stages of recovery book (not administered)  0.9 %  sodium chloride infusion (not administered)  acetaminophen (TYLENOL) tablet 650 mg (not administered)    Or  acetaminophen (TYLENOL) solution 650 mg (not administered)    Or  acetaminophen (TYLENOL) suppository 650 mg (not administered)  senna-docusate (Senokot-S) tablet 1 tablet (not administered)  nitroGLYCERIN 100 MCG/ML intra-arterial injection (not administered)  iopamidol (ISOVUE-300) 61 % injection (not administered)  lidocaine (XYLOCAINE) 1 % (with pres) injection (not administered)  lidocaine (PF) (XYLOCAINE) 1 % injection (10 mLs Infiltration Given 03/02/17 2211)  0.9 %  sodium chloride infusion (not administered)  iopamidol (ISOVUE-370) 76 % injection (50 mLs  Contrast Given 03/02/17 1745)  iopamidol (ISOVUE-370) 76 % injection (50 mLs  Contrast Given 03/02/17 1948)  iopamidol (ISOVUE-300) 61 % injection (60 mLs  Contrast Given 03/02/17 2241)  fentaNYL (SUBLIMAZE) 100 MCG/2ML injection (  Override pull for Anesthesia 03/02/17 2211)  hydrALAZINE (APRESOLINE) 20 MG/ML injection (  Override pull for Anesthesia 03/02/17 2213)    Vitals:   03/02/17  2303 03/02/17 2315 03/02/17 2330 03/02/17 2340  BP:  (!) 165/51 (!) 145/71   Pulse:      Resp:      Temp: (!) 97.2 F (36.2 C)   97.7 F (36.5 C)  TempSrc:      SpO2:        Final diagnoses:  Expressive aphasia    Admission/ observation were discussed with the admitting physician, patient and/or family and they are comfortable with the plan.     Final Clinical Impressions(s) / ED Diagnoses   Final diagnoses:  Expressive aphasia    ED Discharge Orders    None       Elnora Morrison, MD 03/03/17 0009

## 2017-03-02 NOTE — Progress Notes (Signed)
5 Fr sheath in place

## 2017-03-02 NOTE — Progress Notes (Signed)
Anesthesia assumed care of patient

## 2017-03-02 NOTE — ED Triage Notes (Signed)
To room via EMS.  LSN 3:15p, onset 3:30p confusion.  Pt A&O to person only.  Pt takes few minutes to try and answer questions then asked "what was there question again".   Pt has said several times "I am fine".

## 2017-03-03 ENCOUNTER — Inpatient Hospital Stay (HOSPITAL_COMMUNITY): Payer: Medicare HMO

## 2017-03-03 ENCOUNTER — Encounter (HOSPITAL_COMMUNITY): Payer: Self-pay | Admitting: Interventional Radiology

## 2017-03-03 DIAGNOSIS — I63312 Cerebral infarction due to thrombosis of left middle cerebral artery: Secondary | ICD-10-CM

## 2017-03-03 DIAGNOSIS — I361 Nonrheumatic tricuspid (valve) insufficiency: Secondary | ICD-10-CM

## 2017-03-03 LAB — HEMOGLOBIN A1C
Hgb A1c MFr Bld: 5.4 % (ref 4.8–5.6)
Mean Plasma Glucose: 108.28 mg/dL

## 2017-03-03 LAB — MRSA PCR SCREENING: MRSA by PCR: NEGATIVE

## 2017-03-03 LAB — LIPID PANEL
Cholesterol: 151 mg/dL (ref 0–200)
HDL: 45 mg/dL (ref >40)
LDL Cholesterol: 94 mg/dL (ref 0–99)
Total CHOL/HDL Ratio: 3.4 RATIO
Triglycerides: 62 mg/dL (ref <150)
VLDL: 12 mg/dL (ref 0–40)

## 2017-03-03 MED ORDER — LABETALOL HCL 5 MG/ML IV SOLN
20.0000 mg | INTRAVENOUS | Status: DC | PRN
  Administered 2017-03-03: 5 mg via INTRAVENOUS
  Administered 2017-03-03: 10 mg via INTRAVENOUS
  Administered 2017-03-03: 3 mg via INTRAVENOUS
  Administered 2017-03-04 (×2): 10 mg via INTRAVENOUS
  Administered 2017-03-04: 5 mg via INTRAVENOUS
  Administered 2017-03-05: 20 mg via INTRAVENOUS
  Administered 2017-03-05: 10 mg via INTRAVENOUS
  Filled 2017-03-03 (×5): qty 4

## 2017-03-03 MED ORDER — LABETALOL HCL 5 MG/ML IV SOLN
INTRAVENOUS | Status: AC
Start: 2017-03-03 — End: 2017-03-03
  Administered 2017-03-03: 5 mg via INTRAVENOUS
  Filled 2017-03-03: qty 4

## 2017-03-03 MED ORDER — POTASSIUM CHLORIDE 10 MEQ/100ML IV SOLN
10.0000 meq | INTRAVENOUS | Status: AC
Start: 2017-03-03 — End: 2017-03-03
  Administered 2017-03-03 (×3): 10 meq via INTRAVENOUS
  Filled 2017-03-03 (×3): qty 100

## 2017-03-03 MED ORDER — BENAZEPRIL HCL 20 MG PO TABS
20.0000 mg | ORAL_TABLET | Freq: Every day | ORAL | Status: DC
  Administered 2017-03-03 – 2017-03-05 (×3): 20 mg via ORAL
  Filled 2017-03-03 (×3): qty 1

## 2017-03-03 MED ORDER — SPIRONOLACTONE 25 MG PO TABS
25.0000 mg | ORAL_TABLET | Freq: Every day | ORAL | Status: DC
  Administered 2017-03-03 – 2017-03-05 (×3): 25 mg via ORAL
  Filled 2017-03-03 (×3): qty 1

## 2017-03-03 MED ORDER — ATORVASTATIN CALCIUM 80 MG PO TABS
80.0000 mg | ORAL_TABLET | Freq: Every day | ORAL | Status: DC
  Administered 2017-03-03 – 2017-03-04 (×2): 80 mg via ORAL
  Filled 2017-03-03 (×2): qty 1

## 2017-03-03 MED ORDER — HALOPERIDOL LACTATE 5 MG/ML IJ SOLN
1.0000 mg | Freq: Once | INTRAMUSCULAR | Status: DC
  Filled 2017-03-03: qty 1

## 2017-03-03 MED ORDER — RESOURCE THICKENUP CLEAR PO POWD
ORAL | Status: DC | PRN
  Filled 2017-03-03: qty 125

## 2017-03-03 MED ORDER — LEVOTHYROXINE SODIUM 100 MCG PO TABS
100.0000 ug | ORAL_TABLET | Freq: Every day | ORAL | Status: DC
  Administered 2017-03-04 – 2017-03-05 (×2): 100 ug via ORAL
  Filled 2017-03-03 (×2): qty 1

## 2017-03-03 MED ORDER — METOPROLOL SUCCINATE ER 25 MG PO TB24
50.0000 mg | ORAL_TABLET | Freq: Two times a day (BID) | ORAL | Status: DC
  Administered 2017-03-03 – 2017-03-05 (×4): 50 mg via ORAL
  Filled 2017-03-03 (×4): qty 1

## 2017-03-03 NOTE — Progress Notes (Signed)
Pt taken to room 4N31. Dentures in mouth and eyeglasses with pt to room

## 2017-03-03 NOTE — Anesthesia Postprocedure Evaluation (Signed)
Anesthesia Post Note  Patient: Yvonne Bailey  Procedure(s) Performed: RADIOLOGY WITH ANESTHESIA (N/A )     Patient location during evaluation: PACU Anesthesia Type: MAC Level of consciousness: awake and alert Pain management: pain level controlled Vital Signs Assessment: post-procedure vital signs reviewed and stable Respiratory status: spontaneous breathing, nonlabored ventilation, respiratory function stable and patient connected to nasal cannula oxygen Cardiovascular status: stable and blood pressure returned to baseline Postop Assessment: no apparent nausea or vomiting Anesthetic complications: no    Last Vitals:  Vitals:   03/02/17 2330 03/02/17 2340  BP: (!) 145/71   Pulse:    Resp:    Temp:  36.5 C  SpO2:      Last Pain:  Vitals:   03/02/17 2303  TempSrc:   PainSc: 0-No pain                 Vesta Wheeland,W. EDMOND

## 2017-03-03 NOTE — Progress Notes (Signed)
OT Cancellation Note  Patient Details Name: Yvonne Bailey MRN: [id number] DOB: 10-27-1940   Cancelled Treatment:    Reason Eval/Treat Not Completed: Patient not medically ready(R femoral sheath in place)  Harolyn Rutherford  [phone] 03/03/2017, 7:13 AM

## 2017-03-03 NOTE — Progress Notes (Addendum)
STROKE TEAM PROGRESS NOTE   HISTORY OF PRESENT ILLNESS (per record) Yvonne Bailey is an 76 y.o. female presenting to the ED via EMS with acute onset of expressive aphasia. There was question of confusion as well versus incorrect responses to questions due to dysphasia. LKN 1515. TOSO 1530. Per nursing note: "Pt A&O to person only. Pt takes few minutes to try and answer questions then asked "what was there question again". Pt has said several times "I am fine"."   The has a history of DVT and takes Eliquis. Also takes Plavix and atorvastatin.   PMHx includes CAD, prior MI, carotid artery disease, DVT, HTN, HLD, ischemic cardiomyopathy with low EF and PAD.   LSN: [id number] tPA Given: No: On oral anticoagulation NIHSS: 5  S/P bilatera;l common carotid arteriogram. RT CFA approach. Findings. 1.Slowly recanalizing ant parietal branch of inf division  Of Lt MCA M2/M3 junction. 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   She went for emergent embolectomy for a core infarct of 0 mL with perfusion mismatch of 24 mL for suspected left M2 occlusion but did not   have a proximal enough clot to be removed. She still has groin arterial sheath as she was on eliquis    Home Medications:  Current Meds  Medication Sig  . apixaban (ELIQUIS) 5 MG TABS tablet Take 1 tablet (5 mg total) by mouth 2 (two) times daily.  Marland Kitchen atorvastatin (LIPITOR) 80 MG tablet Take 1 tablet (80 mg total) by mouth daily.  . benazepril (LOTENSIN) 20 MG tablet TAKE 1 TABLET EVERY DAY (Patient taking differently: TAKE 1 TABLET (20mg ) EVERY DAY)  . clopidogrel (PLAVIX) 75 MG tablet TAKE 1 TABLET EVERY DAY (Patient taking differently: TAKE 1 TABLET (75mg ) EVERY DAY)  . levothyroxine (SYNTHROID, LEVOTHROID) 100 MCG tablet Take 100 mcg by mouth daily before breakfast.  . metoprolol succinate (TOPROL-XL) 50 MG 24 hr tablet Take 1  tablet (50 mg total) by mouth 2 (two) times daily. Take with or immediately following a meal.  . spironolactone (ALDACTONE) 25 MG tablet Take 1 tablet (25 mg total) by mouth daily.      Hospital Medications:  Scheduled: . atorvastatin  80 mg Oral q1800  . benazepril  20 mg Oral Daily  . [START ON 03/04/2017] levothyroxine  100 mcg Oral QAC breakfast  . metoprolol succinate  50 mg Oral BID  . spironolactone  25 mg Oral Daily    OBJECTIVE Temp:  [97.2 F (36.2 C)-98.1 F (36.7 C)] 97.8 F (36.6 C) (12/24 0800) Pulse Rate:  [69-110] 78 (12/24 1000) Cardiac Rhythm: Atrial fibrillation (12/24 1000) Resp:  [11-22] 14 (12/24 1000) BP: (126-194)/(51-154) 126/78 (12/24 1000) SpO2:  [92 %-100 %] 96 % (12/24 1000) Arterial Line BP: (134-193)/(67-103) 178/90 (12/24 1000) Weight:  [222 lb 0.1 oz (100.7 kg)] 222 lb 0.1 oz (100.7 kg) (12/24 0001)  CBC:  Recent Labs  Lab 03/02/17 1716 03/02/17 1717  WBC 9.9  --   NEUTROABS 6.3  --   HGB 13.8 15.0  HCT 42.3 44.0  MCV 96.1  --   PLT 257  --     Basic Metabolic Panel:  Recent Labs  Lab 03/02/17 1716 03/02/17 1717  NA 139 144  K 3.1* 3.1*  CL 104 103  CO2 27  --   GLUCOSE 115* 115*  BUN 14 14  CREATININE 1.14* 1.00  CALCIUM 9.1  --  Lipid Panel:     Component Value Date/Time   CHOL 151 03/03/2017 0551   TRIG 62 03/03/2017 0551   HDL 45 03/03/2017 0551   CHOLHDL 3.4 03/03/2017 0551   VLDL 12 03/03/2017 0551   LDLCALC 94 03/03/2017 0551   HgbA1c:  Lab Results  Component Value Date   HGBA1C 5.4 03/03/2017   Urine Drug Screen:     Component Value Date/Time   LABOPIA NONE DETECTED 03/02/2017 1707   COCAINSCRNUR NONE DETECTED 03/02/2017 1707   LABBENZ NONE DETECTED 03/02/2017 1707   AMPHETMU NONE DETECTED 03/02/2017 1707   THCU NONE DETECTED 03/02/2017 1707   LABBARB NONE DETECTED 03/02/2017 1707    Alcohol Level No results found for: ETH  IMAGING  MRI  Brain Wo Contrast - pending   Cerebral Angiogram -  Dr Corliss Skainseveshwar SUBJECTIVE:/P bilatera;l common carotid arteriogram RT CFA approach. Findings. 1.Slowly recanalizing ant parietal branch of inf division  Of Lt MCA M2/M3 junction. 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 Loss of flow related enhancement of a proximal left M2 branch, suspicious for occlusion.  2. Otherwise negative CTA for large vessel occlusion.  3. Occluded left vertebral artery. Dominant right vertebral artery patent to the vertebrobasilar junction.  4. Moderate carotid siphon atherosclerosis with moderate multifocal stenoses.  5. Carotid bifurcation atherosclerosis without flow-limiting stenosis.    Ct Cerebral Perfusion W Contrast 03/02/2017 IMPRESSION:  Perfusion mismatch volume of 24 mL, corresponding to CT abnormality of the LEFT MCA branch subserving the LEFT frontal cortex and subcortical white matter. No core infarct volume is established.    Dg Chest Port 1 View 03/03/2017 IMPRESSION:  Cardiomegaly without edema or consolidation. There is aortic atherosclerosis. Aortic Atherosclerosis    Ct Head Code Stroke Wo Contrast 03/02/2017 IMPRESSION:  1. No acute intracranial infarct or other process identified.  2. ASPECTS is 10.  3. Multiple remote lacunar infarcts involving the bilateral basal ganglia, right cerebral hemisphere, and bilateral cerebellar hemispheres as above.  4. Atrophy with chronic small vessel ischemic disease.  5. 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     Transthoracic Echocardiogram Complete Bubble Study - Left ventricle: There is a false tendon in the mid LV of no   clinical significance. LVF appears reduced but poor acoustical   windows prevent accurate assessment of EF or 03/03/2017   S /P bilatera;l common carotid arteriogram. RT CFA  approach. Findings. 1.Slowly recanalizing ant parietal branch of inf division  Of Lt MCA M2/M3 junction. 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  . Afebrile. Head is nontraumatic. Neck is supple without bruit.    Cardiac exam no murmur or gallop. Lungs are clear to auscultation. Distal pulses are well felt.  Neurological exam Awake alert marked expressive aphasia and can speak short sentences and few words. Difficulty with naming and the petition. Comprehension  seems better preserved. Nonfluent speech. Follows commands well. Extraocular moments are full range without nystagmus. Decreased blink to threat on the right. Right lower facial weakness. Tongue midline. Fundi not visualized. Motor system exam no upper or lower extremity drift. Fine finger movements slightly diminished on the right. Moves both lower extremities minimally off the bed but cooperation is limited.  ASSESSMENT/PLAN Ms. Yvonne Bailey is a 76 y.o. female with history of DVT on Eliquis, coronary artery disease with previous MI, carotid artery disease, hypertension, lipidemia, ischemic cardiomyopathy and peripheral artery disease presenting with aphasia and possible confusion. She did not receive IV t-PA due to anticoagulation.  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 Multiple remote infarcts.  MRI head - pending  MRA head - not performed  Carotid Doppler - CTA neck 2D Echo -Left ventricle:  There is a false tendon in the mid LV of no clinical significance. LVF appears reduced but poor acoustical windows prevent accurate assessment of EF or   LDL - 94  HgbA1c - 5.4  VTE prophylaxis - SCDs - Eliquis PTA  Diet NPO time specified  Eliquis (apixaban) daily prior to admission, now on No antithrombotic  Patient counseled to be compliant with her antithrombotic medications  Ongoing aggressive stroke risk factor management  Therapy recommendations:  pending  Disposition:  Pending  Hypertension  Stable but occasionally high with known aneurysm  Permissive hypertension (OK if < 220/120) but gradually normalize in 5-7 days  Long-term BP goal normotensive  Hyperlipidemia  Home meds: Lipitor 80 mg daily resumed in hospital  LDL 94, goal < 70  Continue Lipitor - confirm compliance  Continue statin at discharge   Other Stroke Risk Factors  Advanced age  Former cigarette smoker - quit - 26 years ago  Obesity, Body mass index is 35.83 kg/m., recommend weight loss, diet and exercise as appropriate   Hx stroke/TIA - by imaging  Family hx stroke (Brother)  Coronary artery disease   Other Active Problems  Currently not on antiplatelet therapy  Hypokalemia - supplement and recheck  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   Plan / Recommendations   Hospital day # 1  I have personally examined this patient, reviewed notes, independently viewed imaging studies, participated in medical decision making and plan of care.ROS completed by me personally and pertinent positives fully documented  I have made any additions or clarifications directly to the above note. She has presented with aphasia secondary to left MCA branch infarct of embolic etiology from atrial fibrillation it is unclear whether she was compliant with her apixaban. Recommend keeping  flat in bed. till arterial groin sheath can be discontinued. Check MRI scan and she will need to go back on eliquis when she is able to swallow. Physical occupational speech therapy consults. No family available at the bedside for discussion.. Discuss with Dr. Corliss Skains This patient is critically ill and at significant risk of neurological worsening, death and care requires constant monitoring of vital signs, hemodynamics,respiratory and cardiac monitoring, extensive review of multiple databases, frequent neurological assessment, discussion with family, other specialists and medical decision making of high complexity.I have made any additions or clarifications directly to the above note.This critical care time does not  reflect procedure time, or teaching time or supervisory time of PA/NP/Med Resident etc but could involve care discussion time.  I spent 30 minutes of neurocritical care time  in the care of  this patient.     Delia Heady, MD Medical Director [facility] Pager: [phone] 03/03/2017 3:02 PM   To contact Stroke Continuity provider, please refer to [email or web address]. After hours, contact General Neurology

## 2017-03-03 NOTE — Progress Notes (Signed)
Referring Physician(s): Dr Laurence Slate  Supervising Physician: Julieanne Cotton  Patient Status:  [facility] - In-pt  Chief Complaint:  CVA  Subjective:  SUBJECTIVE:/P bilatera;l common carotid arteriogram. RT CFA approach. Findings. 1.Slowly recanalizing ant parietal branch of inf division  Of Lt MCA M2/M3 junction. 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 Authorizing Provider  apixaban (ELIQUIS) 5 MG TABS tablet Take 1 tablet (5 mg total) by mouth 2 (two) times daily. 02/12/16  Yes Kathleene Hazel, MD  atorvastatin (LIPITOR) 80 MG tablet Take 1 tablet (80 mg total) by mouth daily. 02/20/17 05/21/17 Yes Kathleene Hazel, MD  benazepril (LOTENSIN) 20 MG tablet TAKE 1 TABLET EVERY DAY Patient taking differently: TAKE 1 TABLET (20mg ) EVERY DAY 10/09/16  Yes Burns, Bobette Mo, MD  clopidogrel (PLAVIX) 75 MG tablet TAKE 1 TABLET EVERY DAY Patient taking differently: TAKE 1 TABLET (75mg ) EVERY DAY 12/11/16  Yes Kathleene Hazel, MD  metoprolol succinate (TOPROL-XL) 50 MG 24 hr tablet Take 1 tablet (50 mg total) by mouth 2 (two) times daily. Take with or immediately following a meal. 02/12/16  Yes Kathleene Hazel, MD  spironolactone (ALDACTONE) 25 MG tablet Take 1 tablet (25 mg total) by mouth daily. 12/26/16  Yes Kathleene Hazel, MD  amLODipine (NORVASC) 2.5 MG tablet Take 1 tablet (2.5 mg total) by mouth daily. 07/09/16   Pincus Sanes, MD  brimonidine (ALPHAGAN) 0.2 % ophthalmic solution PLACE ONE DROPTO THE LEFT EYE TWICE  DAILY 06/07/15   [provider]  cholecalciferol (VITAMIN D) 1000 UNITS tablet Take 1,000 Units by mouth daily.    [provider]  fish oil-omega-3 fatty acids 1000 MG capsule Take 1,000 mg by mouth daily.     [provider]  furosemide (LASIX) 40 MG tablet Take 1 tablet (40 mg total) by mouth daily. 02/12/16 05/12/16  Kathleene Hazel, MD  latanoprost (XALATAN) 0.005 % ophthalmic solution Place 0.005 drops into both eyes every evening. 06/17/14   [provider]  levothyroxine (SYNTHROID, LEVOTHROID) 112 MCG tablet Take 1 tablet (112 mcg total) by mouth daily. 07/18/16   Pincus Sanes, MD  Multiple Vitamin (MULTIVITAMIN) tablet Take 1 tablet by mouth daily.      [provider]  nitroGLYCERIN (NITROSTAT) 0.4 MG SL tablet Place 1 tablet (0.4 mg total) under the tongue every 5 (five) minutes as needed for chest pain. 09/08/15   Little Ishikawa, NP  Potassium Chloride ER 20 MEQ TBCR Take 20 mEq by mouth daily. 08/08/16   Kathleene Hazel, MD     Vital Signs: BP (!) 142/66   Pulse 77   Temp 98.1 F (36.7 C) (Oral)   Resp 15   Ht 5\' 6"  (1.676 m)   Wt 222 lb 0.1 oz (100.7 kg)   SpO2 95%   BMI 35.83 kg/m   Physical Exam  HENT:  Pleasant and smiling Seems to try to answer questions Dysphasia  Eyes: EOM are normal.  Neck: Neck supple.  Cardiovascular: Normal rate.  Pulmonary/Chest: Effort normal.  Abdominal: Soft.  Musculoskeletal: Normal range of motion.  Nursing note and vitals reviewed.   Imaging: Ct Angio Head W Or Wo Contrast  Result Date: 03/02/2017 CLINICAL DATA:  Initial evaluation for acute expressive aphasia, confusion. Right as this can contact me in a gait signal 9 he had 7 1 and is in the EXAM: CT ANGIOGRAPHY HEAD AND NECK TECHNIQUE: Multidetector CT imaging of the head and neck was performed using the standard protocol during bolus administration of intravenous contrast. Multiplanar CT image reconstructions and MIPs were obtained to evaluate the vascular anatomy. Carotid stenosis measurements (when applicable) are obtained utilizing NASCET criteria,  using the distal internal carotid diameter as the denominator. CONTRAST:  50mL ISOVUE-370 IOPAMIDOL (ISOVUE-370) INJECTION 76% COMPARISON:  Prior CT from earlier same day. FINDINGS: CTA NECK FINDINGS Aortic arch: Visualized aortic arch of normal caliber with normal branch pattern. Atheromatous plaque within the arch and about the origin of the great vessels without flow-limiting stenosis. Partially visualized subclavian artery is patent without stenosis. Right carotid system: Right common carotid artery patent from its origin to the bifurcation without flow-limiting stenosis. Scattered calcified plaque about the right bifurcation without high-grade stenosis. Right ICA patent distally to the skullbase without stenosis, dissection, or occlusion. Left carotid system: Left common carotid artery patent from its origin to the bifurcation without stenosis. Mild atheromatous plaque about the left bifurcation without flow-limiting stenosis. Left ICA patent distally in to the skullbase without stenosis, dissection, or occlusion. Vertebral arteries: Right vertebral artery arises from the subclavian artery. Focal plaque at the origin of the right vertebral artery with mild stenosis. Right vertebral artery patent distally to the skullbase without stenosis, dissection, or occlusion. Left vertebral artery is largely occluded within the neck. Skeleton: No acute osseous abnormality. No worrisome lytic or blastic osseous lesions. Other neck: No acute soft tissue abnormality within the neck. No adenopathy. Thyroid is hypoplastic and/ or absent. Upper chest: Visualized upper chest within normal limits. Partially visualized lungs are clear. Review of the MIP images confirms the above findings CTA HEAD FINDINGS Anterior circulation: Petrous segments widely patent bilaterally. Scattered calcified plaque throughout the cavernous/ supraclinoid segments with moderate multifocal narrowing. ICA termini patent. A1 segments patent. Anterior  cerebral arteries patent to their distal aspects without stenosis. Left M1 segment patent without stenosis. Normal left MCA bifurcation. There is loss of flow related enhancement within a proximal left M2 branch, suspicious for occlusion (series 7, image 85). No definite flow seen distally. Left MCA branches otherwise perfused. Right M1 segment patent without stenosis or occlusion. Normal right MCA bifurcation. No proximal right M2 occlusion. Distal right MCA branches well perfused. Posterior circulation: Dominant right vertebral artery patent to the vertebrobasilar junction without flow-limiting stenosis. Attenuated opacification of the left V4 segment, likely via retrograde flow from the basilar. Right PICA patent. Left PICA not visualized. Short-segment mild stenosis within the mid basilar artery. Basilar overall diminutive in nature. Superior cerebral arteries patent bilaterally. Fetal type origin of the left PCA supplied via a left posterior communicating artery. Right PCA supplied via the basilar. PCAs patent to their distal aspects with scattered atheromatous irregularity without high-grade stenosis. Venous sinuses: Grossly patent, although not well evaluated due to arterial timing of the contrast bolus. Anatomic variants: None significant.  No aneurysm. Delayed phase: Not performed. Review of the MIP images confirms the above findings IMPRESSION: 1. Loss of flow related enhancement of a proximal left M2 branch, suspicious for occlusion. 2. Otherwise negative CTA for large vessel occlusion. 3. Occluded left vertebral artery. Dominant right vertebral artery patent to the vertebrobasilar junction. 4. Moderate carotid siphon atherosclerosis with moderate multifocal stenoses. 5. Carotid bifurcation atherosclerosis without flow-limiting  stenosis. Critical Value/emergent results were called by telephone at the time of interpretation on 03/02/2017 at 6:06 pm to Dr. Caryl Pina , who verbally acknowledged these  results. Electronically Signed   By: Rise Mu M.D.   On: 03/02/2017 18:49   Ct Angio Neck W Or Wo Contrast  Result Date: 03/02/2017 CLINICAL DATA:  Initial evaluation for acute expressive aphasia, confusion. Right as this can contact me in a gait signal 9 he had 7 1 and is in the EXAM: CT ANGIOGRAPHY HEAD AND NECK TECHNIQUE: Multidetector CT imaging of the head and neck was performed using the standard protocol during bolus administration of intravenous contrast. Multiplanar CT image reconstructions and MIPs were obtained to evaluate the vascular anatomy. Carotid stenosis measurements (when applicable) are obtained utilizing NASCET criteria, using the distal internal carotid diameter as the denominator. CONTRAST:  95mL ISOVUE-370 IOPAMIDOL (ISOVUE-370) INJECTION 76% COMPARISON:  Prior CT from earlier same day. FINDINGS: CTA NECK FINDINGS Aortic arch: Visualized aortic arch of normal caliber with normal branch pattern. Atheromatous plaque within the arch and about the origin of the great vessels without flow-limiting stenosis. Partially visualized subclavian artery is patent without stenosis. Right carotid system: Right common carotid artery patent from its origin to the bifurcation without flow-limiting stenosis. Scattered calcified plaque about the right bifurcation without high-grade stenosis. Right ICA patent distally to the skullbase without stenosis, dissection, or occlusion. Left carotid system: Left common carotid artery patent from its origin to the bifurcation without stenosis. Mild atheromatous plaque about the left bifurcation without flow-limiting stenosis. Left ICA patent distally in to the skullbase without stenosis, dissection, or occlusion. Vertebral arteries: Right vertebral artery arises from the subclavian artery. Focal plaque at the origin of the right vertebral artery with mild stenosis. Right vertebral artery patent distally to the skullbase without stenosis, dissection, or  occlusion. Left vertebral artery is largely occluded within the neck. Skeleton: No acute osseous abnormality. No worrisome lytic or blastic osseous lesions. Other neck: No acute soft tissue abnormality within the neck. No adenopathy. Thyroid is hypoplastic and/ or absent. Upper chest: Visualized upper chest within normal limits. Partially visualized lungs are clear. Review of the MIP images confirms the above findings CTA HEAD FINDINGS Anterior circulation: Petrous segments widely patent bilaterally. Scattered calcified plaque throughout the cavernous/ supraclinoid segments with moderate multifocal narrowing. ICA termini patent. A1 segments patent. Anterior cerebral arteries patent to their distal aspects without stenosis. Left M1 segment patent without stenosis. Normal left MCA bifurcation. There is loss of flow related enhancement within a proximal left M2 branch, suspicious for occlusion (series 7, image 85). No definite flow seen distally. Left MCA branches otherwise perfused. Right M1 segment patent without stenosis or occlusion. Normal right MCA bifurcation. No proximal right M2 occlusion. Distal right MCA branches well perfused. Posterior circulation: Dominant right vertebral artery patent to the vertebrobasilar junction without flow-limiting stenosis. Attenuated opacification of the left V4 segment, likely via retrograde flow from the basilar. Right PICA patent. Left PICA not visualized. Short-segment mild stenosis within the mid basilar artery. Basilar overall diminutive in nature. Superior cerebral arteries patent bilaterally. Fetal type origin of the left PCA supplied via a left posterior communicating artery. Right PCA supplied via the basilar. PCAs patent to their distal aspects with scattered atheromatous irregularity without high-grade stenosis. Venous sinuses: Grossly patent, although not well evaluated due to arterial timing of the contrast bolus. Anatomic variants: None significant.  No aneurysm.  Delayed phase: Not performed. Review of the MIP images confirms the above findings  IMPRESSION: 1. Loss of flow related enhancement of a proximal left M2 branch, suspicious for occlusion. 2. Otherwise negative CTA for large vessel occlusion. 3. Occluded left vertebral artery. Dominant right vertebral artery patent to the vertebrobasilar junction. 4. Moderate carotid siphon atherosclerosis with moderate multifocal stenoses. 5. Carotid bifurcation atherosclerosis without flow-limiting stenosis. Critical Value/emergent results were called by telephone at the time of interpretation on 03/02/2017 at 6:06 pm to Dr. Caryl Pina , who verbally acknowledged these results. Electronically Signed   By: Rise Mu M.D.   On: 03/02/2017 18:49   Ct Cerebral Perfusion W Contrast  Result Date: 03/02/2017 CLINICAL DATA:  Code stroke.  Confusion. EXAM: CT PERFUSION BRAIN TECHNIQUE: Multiphase CT imaging of the brain was performed following IV bolus contrast injection. Subsequent parametric perfusion maps were calculated using RAPID software. CONTRAST:  50mL ISOVUE-370 IOPAMIDOL (ISOVUE-370) INJECTION 76% COMPARISON:  CT head without contrast and CTA head neck earlier in day. FINDINGS: CT Brain Perfusion Findings: CBF (<30%) Volume: 0mL Perfusion (Tmax>6.0s) volume: 24mL Mismatch Volume: 24mL Infarction Location:LEFT frontal cortex and subcortical white matter, LEFT MCA territory. IMPRESSION: Perfusion mismatch volume of 24 mL, corresponding to CT abnormality of the LEFT MCA branch subserving the LEFT frontal cortex and subcortical white matter. No core infarct volume is established. These results were called by telephone at the time of interpretation on 03/02/2017 at 8:16 pm to Dr. Arther Dames , who verbally acknowledged these results. Electronically Signed   By: Elsie Stain M.D.   On: 03/02/2017 20:17   Dg Chest Port 1 View  Result Date: 03/03/2017 CLINICAL DATA:  Recent CVA EXAM: PORTABLE CHEST 1 VIEW  COMPARISON:  March 02, 2017 FINDINGS: There is no edema or consolidation. There is cardiomegaly with pulmonary vascularity within normal limits. There is aortic atherosclerosis. No adenopathy. No bone lesions. IMPRESSION: Cardiomegaly without edema or consolidation. There is aortic atherosclerosis. Aortic Atherosclerosis (ICD10-I70.0). Electronically Signed   By: Bretta Bang III M.D.   On: 03/03/2017 07:26   Ct Head Code Stroke Wo Contrast  Result Date: 03/02/2017 CLINICAL DATA:  Code stroke. Initial evaluation for acute altered mental status. Expressive aphasia. EXAM: CT HEAD WITHOUT CONTRAST TECHNIQUE: Contiguous axial images were obtained from the base of the skull through the vertex without intravenous contrast. COMPARISON:  Prior MRI from 09/02/2012. FINDINGS: Brain: Generalized age related cerebral atrophy. Patchy and confluent hypodensity within the periventricular and deep white matter both cerebral hemispheres most consistent with chronic small vessel ischemic disease, moderate nature. Prominent remote right basal ganglia lacunar infarct. Remote infarcts within the overlying right frontal and insular cortices. Additional smaller left basal ganglia/corona radiata lacunar infarct noted as well. Multiple bilateral remote cerebellar infarcts. Changes are similar to previous. No acute intracranial hemorrhage. No acute large vessel territory infarct. No mass lesion or midline shift. Ventricular prominence related global parenchymal volume loss of hydrocephalus. No extra-axial fluid collection. 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 Vascular: No asymmetric hyperdense vessel. Scattered vascular calcifications noted within the carotid siphons. Skull: Scalp soft tissues and calvarium within normal limits. Sinuses/Orbits: Globes and orbital soft tissues normal. Other: Paranasal sinuses and mastoid air cells are clear. Scattered gas lucency within the cavernous sinus and venous system  of the face, like related IV access. ASPECTS [facility] Stroke Program Early CT Score) - Ganglionic level infarction (caudate, lentiform nuclei, internal capsule, insula, M1-M3 cortex): 7 - Supraganglionic infarction (M4-M6 cortex): 3 Total score (0-10 with 10 being normal): 10 IMPRESSION: 1. No acute intracranial infarct or other process  identified. 2. ASPECTS is 10. 3. Multiple remote lacunar infarcts involving the bilateral basal ganglia, right cerebral hemisphere, and bilateral cerebellar hemispheres as above. 4. Atrophy with chronic small vessel ischemic disease. 5. 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 These results were communicated to Dr. Otelia Limes At 5:45 pmon 12/23/2018by text page via the [facility] messaging system. Electronically Signed   By: Rise Mu M.D.   On: 03/02/2017 17:47    Labs:  CBC: Recent Labs    07/09/16 1441 02/04/17 1707 03/02/17 1716 03/02/17 1717  WBC 13.4* 9.2 9.9  --   HGB 12.9 13.4 13.8 15.0  HCT 38.9 40.8 42.3 44.0  PLT 382.0 246.0 257  --     COAGS: Recent Labs    03/02/17 1716  INR 1.09  APTT 32    BMP: Recent Labs    07/09/16 1441 02/04/17 1707 03/02/17 1716 03/02/17 1717  NA 142 141 139 144  K 3.9 3.8 3.1* 3.1*  CL 103 107 104 103  CO2 33* 29 27  --   GLUCOSE 111* 88 115* 115*  BUN 12 16 14 14   CALCIUM 9.5 9.5 9.1  --   CREATININE 0.97 0.97 1.14* 1.00  GFRNONAA  --   --  46*  --   GFRAA  --   --  53*  --     LIVER FUNCTION TESTS: Recent Labs    07/09/16 1441 02/04/17 1707 03/02/17 1716  BILITOT 0.8 0.8 0.7  AST 21 18 28   ALT 19 14 19   ALKPHOS 105 111 116  PROT 7.7 7.4 7.1  ALBUMIN 3.7 4.1 4.0    Assessment and Plan:  CVA Dysphasia Cerebral arteriogram 12/23 pm No intervention in IR For sheath removal today Plan per Neuro team   Electronically Signed: Joniqua Sidle A, PA-C 03/03/2017, 7:50 AM   I spent a total of 15 Minutes at the the patient's bedside AND  on the patient's hospital floor or unit, greater than 50% of which was counseling/coordinating care for CVA/cerebral arteriogram

## 2017-03-03 NOTE — Progress Notes (Signed)
  Echocardiogram 2D Echocardiogram has been performed.  Yvonne Bailey 03/03/2017, 1:41 PM

## 2017-03-03 NOTE — Progress Notes (Signed)
PT Cancellation Note  Patient Details Name: Yvonne Bailey MRN: [id number] DOB: May 26, 1940   Cancelled Treatment:    Reason Eval/Treat Not Completed: Medical issues which prohibited therapy(pt on strict bedrest)   [name] [name] 03/03/2017, 7:11 AM  Delaney Meigs, PT [phone]

## 2017-03-03 NOTE — Evaluation (Signed)
Clinical/Bedside Swallow Evaluation Patient Details  Name: Yvonne Bailey MRN: [id number] Date of Birth: [birth year]  Today's Date: 03/03/2017 Time: SLP Start Time (ACUTE ONLY): 6384 SLP Stop Time (ACUTE ONLY): 1359 SLP Time Calculation (min) (ACUTE ONLY): 26 min  Past Medical History:  Past Medical History:  Diagnosis Date  . CAD (coronary artery disease)    S/P  anterior  wall myocardial infarction in '92, treated w/percutaneous transluminal coronary angioplasty of the left anterior descending. EF 45%  . Carotid artery disease ([place])   . DVT (deep venous thrombosis) ([place])    X1  . History of nuclear stress test    a. Myoview 6/17: EF 20-25%, mid anteroseptal, apical anterior, apical septal, apical inferior, apical lateral and apical scar, no ischemia, intermediate risk  . HTN (hypertension)   . Hyperlipidemia   . Hypothyroidism   . Ischemic cardiomyopathy    a. Echo 6/17: EF 20-25%, apex appears akinetic, MAC, moderate MR, moderate LAE, mild RVE, trivial PI, PASP 47 mmHg (needs repeat with Definity contrast) //  b. Limited echo with Definity contrast 7/17: EF 25-30%, moderate to severe LAE  . MI (myocardial infarction) ([place]) 1992   at age 47; TIA treated with coumadin until Plavix initiated in 2006  . PAD (peripheral artery disease) ([facility])    Right SFA occlusion, severe disease left CFA and SFA   Past Surgical History:  Past Surgical History:  Procedure Laterality Date  . APPENDECTOMY     at hysterectomy and USO for fibroids, Dr. Ysidro Evert  . CARDIAC CATHETERIZATION  1992   Dr Eustace Quail  . CARDIAC CATHETERIZATION N/A 09/06/2015   Procedure: Right/Left Heart Cath and Coronary Angiography;  Surgeon: Burnell Blanks, MD;  Location: [facility];  Service: Cardiovascular;  Laterality: N/A;  . CARDIAC CATHETERIZATION N/A 09/07/2015   Procedure: Coronary Stent Intervention;  Surgeon: Burnell Blanks, MD;  Location: [facility];  Service: Cardiovascular;   Laterality: N/A;  . COLONOSCOPY     negative; 2008, Dr. Delfin Edis  . fracture LLE     '94; pinned  . TONSILLECTOMY    . TOTAL ABDOMINAL HYSTERECTOMY     & BSO for fibroids   HPI:  76 y.o.femalepresenting with acute onset of expressive aphasia. CT negative MRI pending.    Assessment / Plan / Recommendation Clinical Impression  Pt demonstrates coughing with 20% of thin liquid trials taken with large consecutive sips. Depsite verbal cues to slow rate pt continued to take large sips, though this seemed more of a perseverative action than intentional. When given honey thick liquids, puree and solids pt tolerated all without further signs of aspiration. Given that pt is to lay flat due to femoral sheath, recommend conservative diet of dys 3/honey thick liquids until SLP can reassess for upgrade to thin. Pt and RN in agreement. Will follow also for cognitive linguistic assessment.  SLP Visit Diagnosis: Dysphagia, oropharyngeal phase (R13.12)    Aspiration Risk  Mild aspiration risk    Diet Recommendation Dysphagia 3 (Mech soft);Honey-thick liquid   Liquid Administration via: Cup;Straw Medication Administration: Whole meds with liquid Supervision: Staff to assist with self feeding Compensations: Slow rate;Small sips/bites Postural Changes: Other (Comment)(ok to lay flat with PO)    Other  Recommendations Oral Care Recommendations: Oral care BID   Follow up Recommendations Inpatient Rehab      Frequency and Duration min 2x/week  2 weeks       Prognosis Prognosis for Safe Diet Advancement: Good  Swallow Study   General HPI: 76 y.o.femalepresenting with acute onset of expressive aphasia. CT negative MRI pending.  Type of Study: Bedside Swallow Evaluation Diet Prior to this Study: NPO Temperature Spikes Noted: No Respiratory Status: Room air History of Recent Intubation: No Behavior/Cognition: Alert;Cooperative;Pleasant mood;Requires cueing Oral Cavity Assessment:  Within Functional Limits Oral Care Completed by SLP: No Oral Cavity - Dentition: Adequate natural dentition Vision: Functional for self-feeding Self-Feeding Abilities: Able to feed self Patient Positioning: Upright in bed Baseline Vocal Quality: Normal Volitional Cough: Strong Volitional Swallow: Able to elicit    Oral/Motor/Sensory Function Overall Oral Motor/Sensory Function: Within functional limits   Ice Chips Ice chips: Within functional limits   Thin Liquid Thin Liquid: Impaired Presentation: Cup;Straw;Self Fed Pharyngeal  Phase Impairments: Cough - Immediate    Nectar Thick Nectar Thick Liquid: Not tested   Honey Thick Honey Thick Liquid: Within functional limits Presentation: Cup;Straw;Self fed   Puree Puree: Within functional limits Presentation: Self Fed;Spoon   Solid   GO   Solid: Within functional limits       [facility], [place] CCC-SLP [phone]  Lynann Beaver 03/03/2017,2:06 PM

## 2017-03-04 LAB — BASIC METABOLIC PANEL
Anion gap: 9 (ref 5–15)
BUN: 8 mg/dL (ref 6–20)
CO2: 25 mmol/L (ref 22–32)
Calcium: 8.8 mg/dL — ABNORMAL LOW (ref 8.9–10.3)
Chloride: 106 mmol/L (ref 101–111)
Creatinine, Ser: 1.1 mg/dL — ABNORMAL HIGH (ref 0.44–1.00)
GFR calc Af Amer: 55 mL/min — ABNORMAL LOW (ref >60)
GFR calc non Af Amer: 48 mL/min — ABNORMAL LOW (ref >60)
Glucose, Bld: 115 mg/dL — ABNORMAL HIGH (ref 65–99)
Potassium: 3.7 mmol/L (ref 3.5–5.1)
Sodium: 140 mmol/L (ref 135–145)

## 2017-03-04 MED ORDER — ATROPINE SULFATE 1 MG/ML IJ SOLN
INTRAMUSCULAR | Status: AC
  Filled 2017-03-04: qty 1

## 2017-03-04 MED ORDER — ASPIRIN 325 MG PO TABS
325.0000 mg | ORAL_TABLET | Freq: Every day | ORAL | Status: DC
  Administered 2017-03-05: 325 mg via ORAL
  Filled 2017-03-04: qty 1

## 2017-03-04 NOTE — Progress Notes (Signed)
Spoke with Yvonne Dandy NP. She stated that no one from IR could come in unless it was an emergency. She stated that if [facility] has a certified nurse who has time and is willing to pull sheath, it may be done at 5 pm today, otherwise, sheath will be pulled buy IR staff tomorrow. Yvonne Bailey aware that po asprin that Dr Pearlean Brownie told me to give "After sheath removal" will be delayed.

## 2017-03-04 NOTE — Progress Notes (Signed)
Spoke to [employer] [phone]) He states sheath cannot come out until 48 hr post admission due to Eliquis per Dr Corliss Skains. He states then no IR tech will be able to remove sheath. Dr Pearlean Brownie paged to notify.

## 2017-03-04 NOTE — Progress Notes (Signed)
STROKE TEAM PROGRESS NOTE   HISTORY OF PRESENT ILLNESS (per record) Yvonne Bailey is an 76 y.o. female presenting to the ED via EMS with acute onset of expressive aphasia. There was question of confusion as well versus incorrect responses to questions due to dysphasia. LKN 1515. TOSO 1530. Per nursing note: "Pt A&O to person only. Pt takes few minutes to try and answer questions then asked "what was there question again". Pt has said several times "I am fine"."   The has a history of DVT and takes Eliquis. Also takes Plavix and atorvastatin.   PMHx includes CAD, prior MI, carotid artery disease, DVT, HTN, HLD, ischemic cardiomyopathy with low EF and PAD.   LSN: [id number] tPA Given: No: On oral anticoagulation NIHSS: 5  S/P bilatera;l common carotid arteriogram. RT CFA approach. Findings. 1.Slowly recanalizing ant parietal branch of inf division  Of Lt MCA M2/M3 junction. 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   She remains neurologically stable. Blood pressure is adequately controlled. No family at the bedside. She still has groin arterial sheath as she was on eliquis. MRI confirms patchy left MCA branch infarct    Home Medications:  Current Meds  Medication Sig  . apixaban (ELIQUIS) 5 MG TABS tablet Take 1 tablet (5 mg total) by mouth 2 (two) times daily.  Marland Kitchen atorvastatin (LIPITOR) 80 MG tablet Take 1 tablet (80 mg total) by mouth daily.  . benazepril (LOTENSIN) 20 MG tablet TAKE 1 TABLET EVERY DAY (Patient taking differently: TAKE 1 TABLET (20mg ) EVERY DAY)  . clopidogrel (PLAVIX) 75 MG tablet TAKE 1 TABLET EVERY DAY (Patient taking differently: TAKE 1 TABLET (75mg ) EVERY DAY)  . levothyroxine (SYNTHROID, LEVOTHROID) 100 MCG tablet Take 100 mcg by mouth daily before breakfast.  . metoprolol succinate (TOPROL-XL) 50 MG 24 hr tablet Take 1 tablet (50 mg total) by mouth 2 (two)  times daily. Take with or immediately following a meal.  . spironolactone (ALDACTONE) 25 MG tablet Take 1 tablet (25 mg total) by mouth daily.      Hospital Medications:  Scheduled: . atorvastatin  80 mg Oral q1800  . benazepril  20 mg Oral Daily  . haloperidol lactate  1 mg Intravenous Once  . levothyroxine  100 mcg Oral QAC breakfast  . metoprolol succinate  50 mg Oral BID  . spironolactone  25 mg Oral Daily    OBJECTIVE Temp:  [97.4 F (36.3 C)-98.5 F (36.9 C)] 97.4 F (36.3 C) (12/25 0800) Pulse Rate:  [57-116] 61 (12/25 0730) Cardiac Rhythm: Atrial fibrillation (12/25 0200) Resp:  [11-22] 15 (12/25 0730) BP: (100-194)/(31-120) 145/77 (12/25 0700) SpO2:  [95 %-100 %] 98 % (12/25 0730) Arterial Line BP: (110-188)/(60-105) 172/77 (12/25 0730)  CBC:  Recent Labs  Lab 03/02/17 1716 03/02/17 1717  WBC 9.9  --   NEUTROABS 6.3  --   HGB 13.8 15.0  HCT 42.3 44.0  MCV 96.1  --   PLT 257  --     Basic Metabolic Panel:  Recent Labs  Lab 03/02/17 1716 03/02/17 1717 03/04/17 0250  NA 139 144 140  K 3.1* 3.1* 3.7  CL 104 103 106  CO2 27  --  25  GLUCOSE 115* 115* 115*  BUN 14 14 8   CREATININE 1.14* 1.00 1.10*  CALCIUM 9.1  --  8.8*    Lipid Panel:     Component Value Date/Time  CHOL 151 03/03/2017 0551   TRIG 62 03/03/2017 0551   HDL 45 03/03/2017 0551   CHOLHDL 3.4 03/03/2017 0551   VLDL 12 03/03/2017 0551   LDLCALC 94 03/03/2017 0551   HgbA1c:  Lab Results  Component Value Date   HGBA1C 5.4 03/03/2017   Urine Drug Screen:     Component Value Date/Time   LABOPIA NONE DETECTED 03/02/2017 1707   COCAINSCRNUR NONE DETECTED 03/02/2017 1707   LABBENZ NONE DETECTED 03/02/2017 1707   AMPHETMU NONE DETECTED 03/02/2017 1707   THCU NONE DETECTED 03/02/2017 1707   LABBARB NONE DETECTED 03/02/2017 1707    Alcohol Level No results found for: ETH  IMAGING  MRI  Brain Wo Contrast - pending   Cerebral Angiogram - Dr Corliss Skainseveshwar SUBJECTIVE:/P bilatera;l  common carotid arteriogram RT CFA approach. Findings. 1.Slowly recanalizing ant parietal branch of inf division  Of Lt MCA M2/M3 junction. 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 Loss of flow related enhancement of a proximal left M2 branch, suspicious for occlusion.  2. Otherwise negative CTA for large vessel occlusion.  3. Occluded left vertebral artery. Dominant right vertebral artery patent to the vertebrobasilar junction.  4. Moderate carotid siphon atherosclerosis with moderate multifocal stenoses.  5. Carotid bifurcation atherosclerosis without flow-limiting stenosis.    Ct Cerebral Perfusion W Contrast 03/02/2017 IMPRESSION:  Perfusion mismatch volume of 24 mL, corresponding to CT abnormality of the LEFT MCA branch subserving the LEFT frontal cortex and subcortical white matter. No core infarct volume is established.    Dg Chest Port 1 View 03/03/2017 IMPRESSION:  Cardiomegaly without edema or consolidation. There is aortic atherosclerosis. Aortic Atherosclerosis    Ct Head Code Stroke Wo Contrast 03/02/2017 IMPRESSION:  1. No acute intracranial infarct or other process identified.  2. ASPECTS is 10.  3. Multiple remote lacunar infarcts involving the bilateral basal ganglia, right cerebral hemisphere, and bilateral cerebellar hemispheres as above.  4. Atrophy with chronic small vessel ischemic disease.  5. 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     Transthoracic Echocardiogram Complete Bubble Study - Left ventricle: There is a false tendon in the mid LV of no   clinical significance. LVF appears reduced but poor acoustical   windows prevent accurate assessment of EF or 03/03/2017   S /P bilatera;l common carotid arteriogram. RT CFA approach. Findings. 1.Slowly recanalizing ant parietal  branch of inf division  Of Lt MCA M2/M3 junction. 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 2. Old RIGHT basal ganglia infarct, likely hemorrhagic etiology. 3. Numerous old small and large vessel infarcts including small bifrontal/MCA territory infarcts  PHYSICAL EXAM Vitals:   03/04/17 0632 03/04/17 0700 03/04/17 0730 03/04/17 0800  BP:  (!) 145/77    Pulse: 73 67 61   Resp: 17 15 15    Temp:    (!) 97.4 F (36.3 C)  TempSrc:    Oral  SpO2: 97% 98% 98%   Weight:      Height:       Pleasant elderly African-American lady not in distress.  . Afebrile. Head is nontraumatic. Neck is supple without bruit.    Cardiac exam no murmur or gallop. Lungs are clear to auscultation. Distal pulses are well felt. Right  groin arterial sheath  Neurological exam Awake alert marked expressive aphasia and can speak short sentences and few words. Difficulty with naming and repetition. Comprehension seems better preserved. Nonfluent speech. Follows commands well. Extraocular moments are full range without nystagmus. Decreased blink to threat on the right. Right lower facial weakness. Tongue midline. Fundi not visualized. Motor system exam no upper or lower extremity drift. Fine finger movements slightly diminished on the right. Moves both lower extremities minimally off the bed but cooperation is limited.  ASSESSMENT/PLAN Yvonne Bailey is a 76 y.o. female with history of DVT on Eliquis, coronary artery disease with previous MI, carotid artery disease, hypertension, lipidemia, ischemic cardiomyopathy and peripheral artery disease presenting with aphasia and possible confusion. She did not receive IV t-PA due to anticoagulation.  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 Multiple remote infarcts. MRI head - 1. 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 2. Old RIGHT basal ganglia infarct, likely hemorrhagic etiology. 3. Numerous old small and large vessel infarcts including small bifrontal/MCA territory infarcts MRA head - not performed  Carotid Doppler - CTA neck 2D Echo -Left ventricle: There is a false tendon in the mid LV of no clinical significance. LVF appears reduced but poor acoustical windows prevent accurate assessment of EF or   LDL - 94  HgbA1c - 5.4  VTE prophylaxis - SCDs - Eliquis PTA DIET DYS 3 Room service appropriate? Yes; Fluid consistency: Honey Thick  Eliquis (apixaban) daily prior to admission, now on No antithrombotic  Patient counseled to be compliant with her antithrombotic medications  Ongoing aggressive stroke risk factor management  Therapy recommendations:  pending  Disposition:  Pending  Hypertension  Stable but occasionally high with known aneurysm  Permissive hypertension (OK if < 220/120) but gradually normalize in 5-7 days  Long-term BP goal normotensive  Hyperlipidemia  Home meds: Lipitor 80 mg daily resumed in hospital  LDL 94, goal < 70  Continue Lipitor - confirm compliance  Continue statin at discharge   Other Stroke Risk Factors  Advanced age  Former cigarette smoker - quit - 26 years ago  Obesity, Body mass index is 35.83 kg/m., recommend weight loss, diet and exercise as appropriate   Hx stroke/TIA - by imaging  Family hx stroke (Brother)  Coronary artery disease   Other Active Problems  Currently not on antiplatelet therapy  Hypokalemia - supplement and recheck  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   Plan / Recommendations   Hospital day # 2  I have personally examined this  patient, reviewed notes, independently viewed imaging studies, participated in medical decision making and plan of care.ROS completed by me personally and pertinent positives fully documented  I have made any additions or clarifications directly to the above note. She has presented with aphasia secondary to left MCA branch infarct of embolic etiology from atrial fibrillation it is unclear whether she was compliant with her apixaban. Recommend keeping flat in bed. till arterial groin sheath can be discontinued.  Resume all home medications except apixaban and Plavix until sheath is removed. she will need to go back on eliquis when she is able to swallow. Physical occupational speech therapy consults. No family available at the bedside  for discussion.. This patient is critically ill and at significant risk of neurological worsening, death and care requires constant monitoring of vital signs, hemodynamics,respiratory and cardiac monitoring, extensive review of multiple databases, frequent neurological assessment, discussion with family, other specialists and medical decision making of high complexity.I have made any additions or clarifications directly to the above note.This critical care time does not reflect procedure time, or teaching time or supervisory time of PA/NP/Med Resident etc but could involve care discussion time.  I spent 30 minutes of neurocritical care time  in the care of  this patient.     Delia Heady, MD Medical Director [facility] Pager: [phone] 03/04/2017 12:15 PM   To contact Stroke Continuity provider, please refer to [email or web address]. After hours, contact General Neurology

## 2017-03-04 NOTE — Progress Notes (Signed)
R femoral sheath d/c'd per order. Manual pressure held for 30 minutes. VSS throughout. Site level 0. Post education given to pt and to pts RN.

## 2017-03-04 NOTE — Progress Notes (Signed)
Marguerita Merles RN [phone]) paged to request sheath removal per Dr Pearlean Brownie request.

## 2017-03-05 ENCOUNTER — Encounter (HOSPITAL_COMMUNITY): Payer: Self-pay | Admitting: Interventional Radiology

## 2017-03-05 ENCOUNTER — Inpatient Hospital Stay (HOSPITAL_COMMUNITY): Payer: Medicare HMO

## 2017-03-05 DIAGNOSIS — I82409 Acute embolism and thrombosis of unspecified deep veins of unspecified lower extremity: Secondary | ICD-10-CM

## 2017-03-05 MED ORDER — APIXABAN 5 MG PO TABS
5.0000 mg | ORAL_TABLET | Freq: Two times a day (BID) | ORAL | Status: DC
  Administered 2017-03-05 – 2017-03-06 (×3): 5 mg via ORAL
  Filled 2017-03-05 (×3): qty 1

## 2017-03-05 MED ORDER — RIVAROXABAN 20 MG PO TABS: 20.0000 mg | ORAL_TABLET | Freq: Every day | ORAL | Status: DC

## 2017-03-05 NOTE — Progress Notes (Signed)
Rehab Admissions Coordinator Note:  Patient was screened by Trish Mage for appropriateness for an Inpatient Acute Rehab Consult.  At this time, we are recommending Inpatient Rehab consult.  Trish Mage 03/05/2017, 1:35 PM  I can be reached at [phone].

## 2017-03-05 NOTE — Evaluation (Signed)
Speech Language Pathology Evaluation Patient Details Name: Yvonne Bailey MRN: [id number] DOB: 01/23/1941 Today's Date: 03/05/2017 Time: 1194-1740 SLP Time Calculation (min) (ACUTE ONLY): 15 min  Problem List:  Patient Active Problem List   Diagnosis Date Noted  . Stroke (cerebrum) ([place]) 03/02/2017  . Prediabetes 07/28/2016  . DOE (dyspnea on exertion) 07/09/2016  . Gait instability 07/09/2016  . Itching 04/16/2016  . Unstable angina ([place]) 09/06/2015  . Cardiomyopathy, ischemic   . Chronic systolic heart failure ([place]) 08/21/2015  . Bilateral leg edema 07/19/2015  . Atrial fibrillation ([place]) 07/19/2015  . Urinary urgency 03/30/2015  . Other musculoskeletal symptoms referable to limbs(729.89) 12/17/2012  . Lacunar infarction 12/17/2012  . Small vessel disease, cerebrovascular 12/17/2012  . CAROTID BRUIT, RIGHT 08/10/2008  . Peripheral vascular disease ([place]) 06/04/2007  . Diverticulosis of large intestine 06/04/2007  . Hypothyroidism 02/24/2007  . Hyperlipidemia 02/24/2007  . Essential hypertension 02/24/2007  . DVT 01/21/2007  . Coronary atherosclerosis 10/29/2006   Past Medical History:  Past Medical History:  Diagnosis Date  . CAD (coronary artery disease)    S/P  anterior  wall myocardial infarction in '92, treated w/percutaneous transluminal coronary angioplasty of the left anterior descending. EF 45%  . Carotid artery disease ([place])   . DVT (deep venous thrombosis) ([place])    X1  . History of nuclear stress test    a. Myoview 6/17: EF 20-25%, mid anteroseptal, apical anterior, apical septal, apical inferior, apical lateral and apical scar, no ischemia, intermediate risk  . HTN (hypertension)   . Hyperlipidemia   . Hypothyroidism   . Ischemic cardiomyopathy    a. Echo 6/17: EF 20-25%, apex appears akinetic, MAC, moderate MR, moderate LAE, mild RVE, trivial PI, PASP 47 mmHg (needs repeat with Definity contrast) //  b. Limited echo with Definity contrast 7/17: EF  25-30%, moderate to severe LAE  . MI (myocardial infarction) ([place]) 1992   at age 38; TIA treated with coumadin until Plavix initiated in 2006  . PAD (peripheral artery disease) ([facility])    Right SFA occlusion, severe disease left CFA and SFA   Past Surgical History:  Past Surgical History:  Procedure Laterality Date  . APPENDECTOMY     at hysterectomy and USO for fibroids, Dr. Ysidro Evert  . CARDIAC CATHETERIZATION  1992   Dr Eustace Quail  . CARDIAC CATHETERIZATION N/A 09/06/2015   Procedure: Right/Left Heart Cath and Coronary Angiography;  Surgeon: Burnell Blanks, MD;  Location: [place];  Service: Cardiovascular;  Laterality: N/A;  . CARDIAC CATHETERIZATION N/A 09/07/2015   Procedure: Coronary Stent Intervention;  Surgeon: Burnell Blanks, MD;  Location: [facility];  Service: Cardiovascular;  Laterality: N/A;  . COLONOSCOPY     negative; 2008, Dr. Delfin Edis  . fracture LLE     '94; pinned  . IR ANGIO INTRA EXTRACRAN SEL COM CAROTID INNOMINATE BILAT MOD SED  03/02/2017  . IR ANGIO VERTEBRAL SEL VERTEBRAL UNI R MOD SED  03/02/2017  . RADIOLOGY WITH ANESTHESIA N/A 03/02/2017   Procedure: RADIOLOGY WITH ANESTHESIA;  Surgeon: Luanne Bras, MD;  Location: [facility];  Service: Radiology;  Laterality: N/A;  . TONSILLECTOMY    . TOTAL ABDOMINAL HYSTERECTOMY     & BSO for fibroids   HPI:  76 y.o.femalepresenting with acute onset of expressive aphasia. MRI Brain12/24/2018: 1. 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 2. Old RIGHT basal ganglia infarct, likely hemorrhagic etiology. 3. Numerous old small and large vessel infarcts including small bifrontal/MCA territory infarcts.  Assessment / Plan / Recommendation Clinical Impression  Pt presents with an expressive > receptive aphasia and agraphia without alexia.  She demonstrates presence of phonemic and semantic paraphasias, improved fluency of speech, recognition of deficits with emerging  ability to self-correct.  Repetition of high frequency words/sentences intact; breaks down with phonemic complexity.  Divergent naming is significantly impaired.   Mild deficits in speech and generalized motor planning; perseverations present.  Pt will need 24/7 supervision initially given deficits in sequencing, problem-solving.  She will require aphasia therapy here and in post-acute care.     SLP Assessment  SLP Recommendation/Assessment: Patient needs continued Speech Lanaguage Pathology Services SLP Visit Diagnosis: Aphasia (R47.01)    Follow Up Recommendations  Inpatient Rehab    Frequency and Duration min 2x/week  1 week      SLP Evaluation Cognition  Overall Cognitive Status: Impaired/Different from baseline Arousal/Alertness: Awake/alert Attention: Selective Selective Attention: Appears intact Problem Solving: Impaired Problem Solving Impairment: Functional basic       Comprehension  Auditory Comprehension Overall Auditory Comprehension: Impaired Yes/No Questions: Impaired Complex Questions: 50-74% accurate Commands: Impaired Two Step Basic Commands: 75-100% accurate Visual Recognition/Discrimination Discrimination: Exceptions to [facility] Pictures: (group of pictures) Reading Comprehension Reading Status: Impaired Word level: Within functional limits Sentence Level: Within functional limits Paragraph Level: Impaired Functional Environmental (signs, name badge): Not tested    Expression Expression Primary Mode of Expression: Verbal Verbal Expression Overall Verbal Expression: Impaired Initiation: No impairment Level of Generative/Spontaneous Verbalization: Conversation Repetition: Impaired Level of Impairment: Sentence level Naming: Impairment Responsive: 76-100% accurate Confrontation: Impaired Pictures: (group of pictures) Convergent: 75-100% accurate Divergent: 0-24% accurate Verbal Errors: Semantic paraphasias;Phonemic paraphasias Pragmatics: No  impairment Written Expression Dominant Hand: Right Written Expression: Exceptions to [facility] Copy Ability: Letter Dictation Ability: Letter   Oral / Motor  Oral Motor/Sensory Function Overall Oral Motor/Sensory Function: Within functional limits Motor Speech Overall Motor Speech: Appears within functional limits for tasks assessed   GO                    Juan Quam Laurice 03/05/2017, 11:19 AM

## 2017-03-05 NOTE — Progress Notes (Signed)
  Speech Language Pathology Treatment: Dysphagia  Patient Details Name: Yvonne Bailey MRN: [id number] DOB: 1940/03/15 Today's Date: 03/05/2017 Time: 0315-9458 SLP Time Calculation (min) (ACUTE ONLY): 15 min  Assessment / Plan / Recommendation Clinical Impression  F/u after initial bedside swallow evaluation.  Pt with much improved swallow function marked by adequate mastication, brisk swallow response, and no overt s/s of aspiration with thin liquids.  Pt required assist with self-feeding (e.g, removing plastic from spoon before she stirred her coffee) - recommend advancing diet to regular solids, thin liquids.  No SLP f/u for swallowing needed.  We will still follow for aphasia.     HPI HPI: 76 y.o.femalepresenting with acute onset of expressive aphasia. MRI Brain12/24/2018: 1. 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 2. Old RIGHT basal ganglia infarct, likely hemorrhagic etiology. 3. Numerous old small and large vessel infarcts including small bifrontal/MCA territory infarcts.      SLP Plan  All goals met  Patient needs continued Speech Lanaguage Pathology Services    Recommendations  Diet recommendations: Regular;Thin liquid Liquids provided via: Cup;Straw Medication Administration: Whole meds with liquid Supervision: Patient able to self feed                Oral Care Recommendations: Oral care BID Follow up Recommendations: Inpatient Rehab SLP Visit Diagnosis: Dysphagia, unspecified (R13.10) Plan: All goals met       GO                Juan Quam Laurice 03/05/2017, 11:25 AM

## 2017-03-05 NOTE — Progress Notes (Signed)
ANTICOAGULATION CONSULT NOTE - Initial Consult  Pharmacy Consult for apixaban Indication: atrial fibrillation, stroke and hx DVT  Allergies  Allergen Reactions  . Definity [Perflutren Lipid Microsphere] Other (See Comments)    Patient experienced back pain with injection  . Cilostazol Other (See Comments)     Pletal :" head felt funny"    Patient Measurements: Height: _0  (167.6 cm) Weight: 222 lb 0.1 oz (100.7 kg) IBW/kg (Calculated) : 59.3   Vital Signs: Temp: 98 F (36.7 C) (12/26 1436) Temp Source: Oral (12/26 1436) BP: 136/80 (12/26 1436) Pulse Rate: 60 (12/26 1436)  Labs: Recent Labs    03/02/17 1716 03/02/17 1717 03/04/17 0250  HGB 13.8 15.0  --   HCT 42.3 44.0  --   PLT 257  --   --   APTT 32  --   --   LABPROT 14.0  --   --   INR 1.09  --   --   CREATININE 1.14* 1.00 1.10*    Estimated Creatinine Clearance: 52.1 mL/min (A) (by C-G formula based on SCr of 1.1 mg/dL (H)).   Medical History: Past Medical History:  Diagnosis Date  . CAD (coronary artery disease)    S/P  anterior  wall myocardial infarction in '92, treated w/percutaneous transluminal coronary angioplasty of the left anterior descending. EF 45%  . Carotid artery disease ([place])   . DVT (deep venous thrombosis) ([place])    X1  . History of nuclear stress test    a. Myoview 6/17: EF 20-25%, mid anteroseptal, apical anterior, apical septal, apical inferior, apical lateral and apical scar, no ischemia, intermediate risk  . HTN (hypertension)   . Hyperlipidemia   . Hypothyroidism   . Ischemic cardiomyopathy    a. Echo 6/17: EF 20-25%, apex appears akinetic, MAC, moderate MR, moderate LAE, mild RVE, trivial PI, PASP 47 mmHg (needs repeat with Definity contrast) //  b. Limited echo with Definity contrast 7/17: EF 25-30%, moderate to severe LAE  . MI (myocardial infarction) ([place]) 1992   at age 66; TIA treated with coumadin until Plavix initiated in 2006  . PAD (peripheral artery disease) ([facility])    Right SFA occlusion, severe disease left CFA and SFA      Assessment: 76 yo female admitted with expressive aphasia, found to have L MCA infarct. She was on apixaban prior to admission for history of VTE, though with questionable compliance. She was not given tPA. Rivaroxaban previously ordered but was not given. Pharmacy consulted to resume apixaban. No bleeding noted, CBC normal on 12/23. Full dose appropriate for age and weight.  Goal of Therapy:  full anticoagulation Monitor platelets by anticoagulation protocol: Yes   Plan:  Discontinue Rivaroxaban Apixaban 5 mg PO bid CBC in am Monitor for s/sx of bleeding Education prior to discharge   Renold Genta, PharmD, BCPS Clinical Pharmacist Phone for today - [place] - [phone] 03/05/2017 3:34 PM

## 2017-03-05 NOTE — Progress Notes (Signed)
STROKE TEAM PROGRESS NOTE   HISTORY OF PRESENT ILLNESS (per record) Yvonne Bailey is an 76 y.o. female presenting to the ED via EMS with acute onset of expressive aphasia. There was question of confusion as well versus incorrect responses to questions due to dysphasia. LKN 1515. TOSO 1530. Per nursing note: "Pt A&O to person only. Pt takes few minutes to try and answer questions then asked "what was there question again". Pt has said several times "I am fine"."   The has a history of DVT and takes Eliquis. Also takes Plavix and atorvastatin.   PMHx includes CAD, prior MI, carotid artery disease, DVT, HTN, HLD, ischemic cardiomyopathy with low EF and PAD.   LSN: [id number] tPA Given: No: On oral anticoagulation NIHSS: 5  S/P bilatera;l common carotid arteriogram. RT CFA approach. Findings. 1.Slowly recanalizing ant parietal branch of inf division  Of Lt MCA M2/M3 junction. 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   She remains neurologically stable. Blood pressure is adequately controlled. No family at the bedside. She had groin arterial sheath removed last pm. Speech is more clear. She is walking with therapist in room   Home Medications:  Current Meds  Medication Sig  . apixaban (ELIQUIS) 5 MG TABS tablet Take 1 tablet (5 mg total) by mouth 2 (two) times daily.  Marland Kitchen atorvastatin (LIPITOR) 80 MG tablet Take 1 tablet (80 mg total) by mouth daily.  . benazepril (LOTENSIN) 20 MG tablet TAKE 1 TABLET EVERY DAY (Patient taking differently: TAKE 1 TABLET (20mg ) EVERY DAY)  . clopidogrel (PLAVIX) 75 MG tablet TAKE 1 TABLET EVERY DAY (Patient taking differently: TAKE 1 TABLET (75mg ) EVERY DAY)  . levothyroxine (SYNTHROID, LEVOTHROID) 100 MCG tablet Take 100 mcg by mouth daily before breakfast.  . metoprolol succinate (TOPROL-XL) 50 MG 24 hr tablet Take 1 tablet (50 mg total) by mouth 2 (two)  times daily. Take with or immediately following a meal.  . spironolactone (ALDACTONE) 25 MG tablet Take 1 tablet (25 mg total) by mouth daily.      Hospital Medications:  Scheduled: . rivaroxaban  20 mg Oral Q supper    OBJECTIVE Temp:  [97.7 F (36.5 C)-98.6 F (37 C)] 98 F (36.7 C) (12/26 1436) Pulse Rate:  [60-85] 60 (12/26 1436) Cardiac Rhythm: Atrial fibrillation (12/26 1310) Resp:  [0-23] 16 (12/26 1436) BP: (120-198)/(60-108) 136/80 (12/26 1436) SpO2:  [93 %-100 %] 99 % (12/26 1436) Arterial Line BP: (89-193)/(86-105) 106/97 (12/25 2030)  CBC:  Recent Labs  Lab 03/02/17 1716 03/02/17 1717  WBC 9.9  --   NEUTROABS 6.3  --   HGB 13.8 15.0  HCT 42.3 44.0  MCV 96.1  --   PLT 257  --     Basic Metabolic Panel:  Recent Labs  Lab 03/02/17 1716 03/02/17 1717 03/04/17 0250  NA 139 144 140  K 3.1* 3.1* 3.7  CL 104 103 106  CO2 27  --  25  GLUCOSE 115* 115* 115*  BUN 14 14 8   CREATININE 1.14* 1.00 1.10*  CALCIUM 9.1  --  8.8*    Lipid Panel:     Component Value Date/Time   CHOL 151 03/03/2017 0551   TRIG 62 03/03/2017 0551   HDL 45 03/03/2017 0551   CHOLHDL 3.4 03/03/2017 0551   VLDL 12 03/03/2017 0551   LDLCALC 94 03/03/2017 0551   HgbA1c:  Lab Results  Component Value Date   HGBA1C 5.4 03/03/2017   Urine Drug Screen:     Component Value Date/Time   LABOPIA NONE DETECTED 03/02/2017 1707   COCAINSCRNUR NONE DETECTED 03/02/2017 1707   LABBENZ NONE DETECTED 03/02/2017 1707   AMPHETMU NONE DETECTED 03/02/2017 1707   THCU NONE DETECTED 03/02/2017 1707   LABBARB NONE DETECTED 03/02/2017 1707    Alcohol Level No results found for: ETH  IMAGING  MRI  Brain Wo Contrast - pending   Cerebral Angiogram - Dr Corliss Skainseveshwar SUBJECTIVE:/P bilatera;l common carotid arteriogram RT CFA approach. Findings. 1.Slowly recanalizing ant parietal branch of inf division  Of Lt MCA M2/M3 junction. 2.Approc 10 mm x 5.727mm Lt ICA irregular ICA caval cavernous  fusiform aneurysm   Ct Angio Head W Or Wo Contrast Ct Angio Neck W Or Wo Contrast 03/02/2017 IMPRESSION:  1. Loss of flow related enhancement of a proximal left M2 branch, suspicious for occlusion.  2. Otherwise negative CTA for large vessel occlusion.  3. Occluded left vertebral artery. Dominant right vertebral artery patent to the vertebrobasilar junction.  4. Moderate carotid siphon atherosclerosis with moderate multifocal stenoses.  5. Carotid bifurcation atherosclerosis without flow-limiting stenosis.    Ct Cerebral Perfusion W Contrast 03/02/2017 IMPRESSION:  Perfusion mismatch volume of 24 mL, corresponding to CT abnormality of the LEFT MCA branch subserving the LEFT frontal cortex and subcortical white matter. No core infarct volume is established.    Dg Chest Port 1 View 03/03/2017 IMPRESSION:  Cardiomegaly without edema or consolidation. There is aortic atherosclerosis. Aortic Atherosclerosis    Ct Head Code Stroke Wo Contrast 03/02/2017 IMPRESSION:  1. No acute intracranial infarct or other process identified.  2. ASPECTS is 10.  3. Multiple remote lacunar infarcts involving the bilateral basal ganglia, right cerebral hemisphere, and bilateral cerebellar hemispheres as above.  4. Atrophy with chronic small vessel ischemic disease.  5. 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     Transthoracic Echocardiogram Complete Bubble Study - Left ventricle: There is a false tendon in the mid LV of no   clinical significance. LVF appears reduced but poor acoustical   windows prevent accurate assessment of EF or 03/03/2017   S /P bilatera;l common carotid arteriogram. RT CFA approach. Findings. 1.Slowly recanalizing ant parietal branch of inf division  Of Lt MCA M2/M3 junction. 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 Acute nonhemorrhagic LEFT parietal MCA  territory infarct. 2. Old RIGHT basal ganglia infarct, likely hemorrhagic etiology. 3. Numerous old small and large vessel infarcts including small bifrontal/MCA territory infarcts  PHYSICAL EXAM Vitals:   03/05/17 0900 03/05/17 1130 03/05/17 1200 03/05/17 1436  BP: (!) 150/80 (!) 166/66 (!) 159/60 136/80  Pulse: 71 67 64 60  Resp: 18 (!) 0 (!) 0 16  Temp:    98 F (36.7 C)  TempSrc:    Oral  SpO2: 96% 100% 99% 99%  Weight:      Height:       Pleasant elderly African-American lady not in distress.  . Afebrile. Head is nontraumatic. Neck is supple without bruit.    Cardiac exam no murmur or gallop. Lungs are clear to auscultation. Distal pulses are well felt. Right groin arterial sheath  Neurological exam Awake alert moderate expressive aphasia and can speak short sentences and few words. Difficulty with naming and repetition. Comprehension seems better preserved. Nonfluent speech. Follows commands well. Extraocular moments are full range  without nystagmus. Decreased blink to threat on the right. Right lower facial weakness. Tongue midline. Fundi not visualized. Motor system exam no upper or lower extremity drift. Fine finger movements slightly diminished on the right. Moves both lower extremities minimally off the bed but cooperation is limited.  ASSESSMENT/PLAN Ms. SHAWANDA FREILICH is a 76 y.o. female with history of DVT on Eliquis, coronary artery disease with previous MI, carotid artery disease, hypertension, lipidemia, ischemic cardiomyopathy and peripheral artery disease presenting with aphasia and possible confusion. She did not receive IV t-PA due to anticoagulation.  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 Multiple remote infarcts. MRI head - 1. Acute nonhemorrhagic LEFT parietal MCA territory  infarct. 2. Old RIGHT basal ganglia infarct, likely hemorrhagic etiology. 3. Numerous old small and large vessel infarcts including small bifrontal/MCA territory infarcts MRA head - not performed  Carotid Doppler - CTA neck 2D Echo -Left ventricle: There is a false tendon in the mid LV of no clinical significance. LVF appears reduced but poor acoustical windows prevent accurate assessment of EF or   LDL - 94  HgbA1c - 5.4  VTE prophylaxis - SCDs - Eliquis PTA Diet regular Room service appropriate? Yes; Fluid consistency: Thin  Eliquis (apixaban) daily prior to admission but compliance not clear, now on eliquis  Patient counseled to be compliant with her antithrombotic medications  Ongoing aggressive stroke risk factor management  Therapy recommendations:  CLR Disposition:  CLR Hypertension  Stable but occasionally high with known aneurysm  Permissive hypertension (OK if < 220/120) but gradually normalize in 5-7 days  Long-term BP goal normotensive  Hyperlipidemia  Home meds: Lipitor 80 mg daily resumed in hospital  LDL 94, goal < 70  Continue Lipitor - confirm compliance  Continue statin at discharge   Other Stroke Risk Factors  Advanced age  Former cigarette smoker - quit - 26 years ago  Obesity, Body mass index is 35.83 kg/m., recommend weight loss, diet and exercise as appropriate   Hx stroke/TIA - by imaging  Family hx stroke (Brother)  Coronary artery disease   Other Active Problems  Hypokalemia - supplement and recheck  10 mm x 5.59mm 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   Plan / Recommendations  Resume eliquis.and patient counselled about compliance  Hospital day # 3  I have personally examined this patient, reviewed notes, independently viewed imaging studies, participated in medical decision making and plan of care.ROS completed by me  personally and pertinent positives fully documented  I have made any additions or clarifications directly to the above note. She has presented with aphasia secondary to left MCA branch infarct of embolic etiology from atrial fibrillation it is unclear whether she was compliant with her apixaban.  Resume eliquis..Rehab consult. Transfer to neurology floor bed today. No family available at the bedside for discussion.. Greater than 50% time during this 35 minute visit was spent on counseling and coordination of care about atrial fibrillation and cardioembolic stroke and answered questions.    Delia Heady, MD Medical Director [facility] Pager: [phone] 03/05/2017 3:26 PM   To contact Stroke Continuity provider, please refer to [email or web address]. After hours, contact General Neurology

## 2017-03-05 NOTE — Progress Notes (Addendum)
Physical Therapy Evaluation Patient Details Name: Yvonne Bailey MRN: [id number] DOB: March 09, 1941 Today's Date: 03/05/2017   History of Present Illness  76 y.o. female admitted on 03/02/17 for AMS and speech difficulty.  CT and MRI confirmed L MCA territory infarct. Pt s/p bil common carotid anteriogram on 03/02/17.  Pt with significant PMH of PAD, MI, ischemic cardiomyopathy, HTN, DVT, CAD, and L leg fracture s/p pinning in 1994.  Clinical Impression  Pt is min to min guard assist with RW and was able to ambulate into the hallway with assist. Of note, she kept running into things on her right side and may have some visual deficits.  I will ask OT to assess further.  She states she has a grown granddaughter who can stay with her initially to make sure she is ok at home.   PT to follow acutely for deficits listed below.       Follow Up Recommendations CIR    Equipment Recommendations  None recommended by PT    Recommendations for Other Services   NA    Precautions / Restrictions Precautions Precautions: Fall Precaution Comments: mildly unsteady on her feet.       Mobility  Bed Mobility Overal bed mobility: Needs Assistance Bed Mobility: Supine to Sit     Supine to sit: Min assist     General bed mobility comments: Min assist to help pt transition legs and hips to EOB.  She was using bed rail for leverage at trunk with help to reach for it and find it.    Transfers Overall transfer level: Needs assistance Equipment used: Rolling walker (2 wheeled);None;1 person hand held assist Transfers: Sit to/from Stand Sit to Stand: Min assist;Min guard         General transfer comment: Min assist to get to standing without AD from bed.  Min guard assist to get to standing with RW, cues for safe hand placement and RW use during transitions.   Ambulation/Gait Ambulation/Gait assistance: Min assist;Min guard Ambulation Distance (Feet): 35 Feet Assistive device: Rolling walker (2  wheeled) Gait Pattern/deviations: Shuffle Gait velocity: decreased Gait velocity interpretation: Below normal speed for age/gender General Gait Details: Pt needed min hand held assist to walk around the room without RW, when we switched to RW she was min guard assist.  Of note, she kept running into obstacles on her right side.  I will ask OT to further assess vision during their assessment.       Modified Rankin (Stroke Patients Only) Modified Rankin (Stroke Patients Only) Pre-Morbid Rankin Score: Slight disability Modified Rankin: Moderately severe disability     Balance Overall balance assessment: Needs assistance Sitting-balance support: No upper extremity supported;Feet supported Sitting balance-Leahy Scale: Good     Standing balance support: No upper extremity supported;Single extremity supported Standing balance-Leahy Scale: Fair                               Pertinent Vitals/Pain Pain Assessment: No/denies pain    Home Living Family/patient expects to be discharged to:: Private residence Living Arrangements: Alone Available Help at Discharge: Family;Available PRN/intermittently Type of Home: House Home Access: Stairs to enter Entrance Stairs-Rails: Right Entrance Stairs-Number of Steps: 6 Home Layout: One level Home Equipment: Walker - 2 wheels;Cane - single point      Prior Function Level of Independence: Independent;Independent with assistive device(s)         Comments: uses cane for community access, still  drives,      Hand Dominance   Dominant Hand: Right    Extremity/Trunk Assessment   Upper Extremity Assessment Upper Extremity Assessment: Defer to OT evaluation    Lower Extremity Assessment Lower Extremity Assessment: Generalized weakness(no significant difference noted during gait, generally 3/5)    Cervical / Trunk Assessment Cervical / Trunk Assessment: Normal  Communication   Communication: Expressive difficulties   Cognition Arousal/Alertness: Awake/alert Behavior During Therapy: WFL for tasks assessed/performed Overall Cognitive Status: Within Functional Limits for tasks assessed                                               Assessment/Plan    PT Assessment Patient needs continued PT services  PT Problem List Decreased activity tolerance;Decreased strength;Decreased balance;Decreased mobility;Decreased knowledge of use of DME;Obesity       PT Treatment Interventions DME instruction;Stair training;Gait training;Functional mobility training;Therapeutic activities;Therapeutic exercise;Balance training;Neuromuscular re-education;Patient/family education    PT Goals (Current goals can be found in the Care Plan section)  Acute Rehab PT Goals Patient Stated Goal: to get better so she can return home PT Goal Formulation: With patient Time For Goal Achievement: 03/19/17 Potential to Achieve Goals: Good    Frequency Min 4X/week   Barriers to discharge Decreased caregiver support pt lives alone, however, reports she thinks her 20-something grandaughter can stay with her initially       AM-PAC PT "6 Clicks" Daily Activity  Outcome Measure Difficulty turning over in bed (including adjusting bedclothes, sheets and blankets)?: Unable Difficulty moving from lying on back to sitting on the side of the bed? : Unable Difficulty sitting down on and standing up from a chair with arms (e.g., wheelchair, bedside commode, etc,.)?: Unable Help needed moving to and from a bed to chair (including a wheelchair)?: A Little Help needed walking in hospital room?: A Little Help needed climbing 3-5 steps with a railing? : A Lot 6 Click Score: 11    End of Session Equipment Utilized During Treatment: Gait belt Activity Tolerance: Patient tolerated treatment well Patient left: in chair;with call bell/phone within reach;with chair alarm set Nurse Communication: Mobility status;Other (comment)(to  HydrologistN tech) PT Visit Diagnosis: Unsteadiness on feet (R26.81);Difficulty in walking, not elsewhere classified (R26.2);Other symptoms and signs involving the nervous system (X52.841(R29.898)    Time: [id number] PT Time Calculation (min) (ACUTE ONLY): 38 min   Charges:        [name] B. [place], PT, DPT [phone]   PT Evaluation $PT Eval Moderate Complexity: 1 Mod PT Treatments $Therapeutic Activity: 23-37 mins   03/05/2017, 10:27 AM

## 2017-03-05 NOTE — Discharge Instructions (Signed)

## 2017-03-05 NOTE — Plan of Care (Signed)
  Coping: Will verbalize positive feelings about self 03/05/2017 2038 - Progressing by Luther Redo, RN   Health Behavior/Discharge Planning: Ability to manage health-related needs will improve 03/05/2017 2038 - Progressing by Luther Redo, RN   Self-Care: Ability to communicate needs accurately will improve 03/05/2017 2038 - Not Progressing by Luther Redo, RN

## 2017-03-05 NOTE — Progress Notes (Signed)
ANTICOAGULATION CONSULT NOTE - Initial Consult  Pharmacy Consult for Xarelto Indication: atrial fibrillation  Allergies  Allergen Reactions  . Definity [Perflutren Lipid Microsphere] Other (See Comments)    Patient experienced back pain with injection  . Cilostazol Other (See Comments)     Pletal :" head felt funny"    Patient Measurements: Height: 5' 6"  (167.6 cm) Weight: 222 lb 0.1 oz (100.7 kg) IBW/kg (Calculated) : 59.3   Vital Signs: Temp: 98.4 F (36.9 C) (12/26 0830) Temp Source: Oral (12/26 0830) BP: 169/75 (12/26 0830) Pulse Rate: 69 (12/26 0830)  Labs: Recent Labs    03/02/17 1716 03/02/17 1717 03/04/17 0250  HGB 13.8 15.0  --   HCT 42.3 44.0  --   PLT 257  --   --   APTT 32  --   --   LABPROT 14.0  --   --   INR 1.09  --   --   CREATININE 1.14* 1.00 1.10*    Estimated Creatinine Clearance: 52.1 mL/min (A) (by C-G formula based on SCr of 1.1 mg/dL (H)).   Medical History: Past Medical History:  Diagnosis Date  . CAD (coronary artery disease)    S/P  anterior  wall myocardial infarction in '92, treated w/percutaneous transluminal coronary angioplasty of the left anterior descending. EF 45%  . Carotid artery disease ([place])   . DVT (deep venous thrombosis) ([place])    X1  . History of nuclear stress test    a. Myoview 6/17: EF 20-25%, mid anteroseptal, apical anterior, apical septal, apical inferior, apical lateral and apical scar, no ischemia, intermediate risk  . HTN (hypertension)   . Hyperlipidemia   . Hypothyroidism   . Ischemic cardiomyopathy    a. Echo 6/17: EF 20-25%, apex appears akinetic, MAC, moderate MR, moderate LAE, mild RVE, trivial PI, PASP 47 mmHg (needs repeat with Definity contrast) //  b. Limited echo with Definity contrast 7/17: EF 25-30%, moderate to severe LAE  . MI (myocardial infarction) ([place]) 1992   at age 51; TIA treated with coumadin until Plavix initiated in 2006  . PAD (peripheral artery disease) ([facility])    Right SFA  occlusion, severe disease left CFA and SFA      Assessment: 76 yo female admitted with expressive aphasia, found to have L MCA infarct. She is on Eliquis prior to admission for history of VTE, though with questionable compliance. She was not given tPA. Planning to transition patient to Xarelto for Afib. Last cbc a few days ago was WNL, SCr 1.1.  Goal of Therapy:  Anti-Xa level 0.6-1 units/ml 4hrs after LMWH dose given Monitor platelets by anticoagulation protocol: Yes   Plan:  -Xarelto 20 mg po daily -Borderline for dose reduction, watch SCr  Kaylaann Mountz, Jake Church 03/05/2017,11:27 AM

## 2017-03-05 NOTE — Progress Notes (Signed)
*  Preliminary Results* Bilateral lower extremity venous duplex completed. Bilateral lower extremities are negative for deep vein thrombosis. There is no evidence of Baker's cyst bilaterally.  03/05/2017 12:37 PM Gertie Fey, BS, RVT, RDCS, RDMS

## 2017-03-05 NOTE — Consult Note (Signed)
Physical Medicine and Rehabilitation Consult Reason for Consult: Altered mental status and speech difficulty Referring Physician: Dr. Leonie Man   HPI: Yvonne Bailey is a 76 y.o. right handed female with history of CAD/MI ischemic cardiomyopathy, hypertension, hyperlipidemia, DVT maintained on Eliquis and question medical compliance. Presented 03/02/2017 with acute onset of expressive aphasia. Per chart review patient lives alone independent with assistive device prior to admission. Independent with ADLs and still driving. One level home with 6 steps to entry. She has a son and granddaughter in the area that works. Cranial CT scan reviewed, unremarkable for acute process. Per report, multiple remote lacunar infarcts involving bilateral basal ganglia and right cerebral hemisphere. Patient did not receive TPA. CT angiogram head and neck showed loss of flow related enhancement of proximal left M2 branch suspicious for occlusion otherwise negative CTA for large vessel occlusion. 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 Bilateral lower extremity Dopplers negative. Echocardiogram showed no regional abnormalities. 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 Occupational therapy evaluation completed with recommendations of physical medicine rehabilitation consult.   Review of Systems  Constitutional: Negative for chills and fever.  HENT: Negative for hearing loss.   Eyes: Negative for blurred vision and double vision.  Respiratory: Negative for cough.   Cardiovascular: Negative for chest pain, palpitations and leg swelling.  Gastrointestinal: Positive for constipation. Negative for nausea and vomiting.    Genitourinary: Negative for dysuria, flank pain and hematuria.  Musculoskeletal: Positive for joint pain and myalgias.  Skin: Negative for rash.  Neurological: Positive for speech change and focal weakness.  All other systems reviewed and are negative.  Past Medical History:  Diagnosis Date  . CAD (coronary artery disease)    S/P  anterior  wall myocardial infarction in '92, treated w/percutaneous transluminal coronary angioplasty of the left anterior descending. EF 45%  . Carotid artery disease ([place])   . DVT (deep venous thrombosis) ([place])    X1  . History of nuclear stress test    a. Myoview 6/17: EF 20-25%, mid anteroseptal, apical anterior, apical septal, apical inferior, apical lateral and apical scar, no ischemia, intermediate risk  . HTN (hypertension)   . Hyperlipidemia   . Hypothyroidism   . Ischemic cardiomyopathy    a. Echo 6/17: EF 20-25%, apex appears akinetic, MAC, moderate MR, moderate LAE, mild RVE, trivial PI, PASP 47 mmHg (needs repeat with Definity contrast) //  b. Limited echo with Definity contrast 7/17: EF 25-30%, moderate to severe LAE  . MI (myocardial infarction) ([place]) 1992   at age 9; TIA treated with coumadin until Plavix initiated in 2006  . PAD (peripheral artery disease) ([facility])    Right SFA occlusion, severe disease left CFA and SFA   Past Surgical History:  Procedure Laterality Date  . APPENDECTOMY     at hysterectomy and USO for fibroids, Dr. Ysidro Evert  . CARDIAC CATHETERIZATION  1992   Dr Eustace Quail  . CARDIAC CATHETERIZATION N/A 09/06/2015   Procedure: Right/Left Heart Cath and Coronary Angiography;  Surgeon: Burnell Blanks, MD;  Location: [facility];  Service: Cardiovascular;  Laterality: N/A;  . CARDIAC CATHETERIZATION N/A 09/07/2015   Procedure: Coronary Stent Intervention;  Surgeon: Burnell Blanks, MD;  Location: [facility];  Service: Cardiovascular;  Laterality: N/A;  . COLONOSCOPY     negative; 2008, Dr. Delfin Edis  . fracture LLE     '94; pinned  . IR ANGIO INTRA EXTRACRAN SEL COM CAROTID INNOMINATE BILAT MOD SED  03/02/2017  . IR ANGIO VERTEBRAL SEL VERTEBRAL UNI R MOD SED  03/02/2017  . RADIOLOGY WITH ANESTHESIA N/A 03/02/2017   Procedure: RADIOLOGY WITH ANESTHESIA;  Surgeon: Luanne Bras, MD;  Location: [facility];  Service: Radiology;  Laterality: N/A;  . TONSILLECTOMY    . TOTAL ABDOMINAL HYSTERECTOMY     & BSO for fibroids   Family History  Problem Relation Age of Onset  . Diabetes Mother   . Hypertension Mother   . Stroke Brother        ?> 55  . Coronary artery disease Brother        stent in 73s  . Cancer Neg Hx    Social History:  reports that she quit smoking about 27 years ago. she has never used smokeless tobacco. She reports that she does not drink alcohol or use drugs. Allergies:  Allergies  Allergen Reactions  . Definity [Perflutren Lipid Microsphere] Other (See Comments)    Patient experienced back pain with injection  . Cilostazol Other (See Comments)     Pletal :" head felt funny"   Medications Prior to Admission  Medication Sig Dispense Refill  . apixaban (ELIQUIS) 5 MG TABS tablet Take 1 tablet (5 mg total) by mouth 2 (two) times daily. 180 tablet 3  . atorvastatin (LIPITOR) 80 MG tablet Take 1 tablet (80 mg total) by mouth daily. 30 tablet 11  . benazepril (LOTENSIN) 20 MG tablet TAKE 1 TABLET EVERY DAY (Patient taking differently: TAKE 1 TABLET (50m) EVERY DAY) 90 tablet 1  . clopidogrel (PLAVIX) 75 MG tablet TAKE 1 TABLET EVERY DAY (Patient taking differently: TAKE 1 TABLET (764m EVERY DAY) 90 tablet 3  . levothyroxine (SYNTHROID, LEVOTHROID) 100 MCG tablet Take 100 mcg by mouth daily before breakfast.    . metoprolol succinate (TOPROL-XL) 50 MG 24 hr tablet Take 1 tablet (50 mg total) by mouth 2 (two) times daily. Take with or immediately following a meal. 180 tablet 3  . spironolactone (ALDACTONE) 25 MG tablet Take 1 tablet (25 mg total) by mouth daily.  30 tablet 7  . amLODipine (NORVASC) 2.5 MG tablet Take 1 tablet (2.5 mg total) by mouth daily. (Patient not taking: Reported on 03/03/2017) 90 tablet 3  . furosemide (LASIX) 40 MG tablet Take 1 tablet (40 mg total) by mouth daily. (Patient not taking: Reported on 03/03/2017) 90 tablet 3  . levothyroxine (SYNTHROID, LEVOTHROID) 112 MCG tablet Take 1 tablet (112 mcg total) by mouth daily. (Patient not taking: Reported on 03/03/2017) 90 tablet 1  . nitroGLYCERIN (NITROSTAT) 0.4 MG SL tablet Place 1 tablet (0.4 mg total) under the tongue every 5 (five) minutes as needed for chest pain. 25 tablet 1  . Potassium Chloride ER 20 MEQ TBCR Take 20 mEq by mouth daily. (Patient not taking: Reported on 03/03/2017) 30 tablet 1    [place] to [place] to[place] Private residence Living Arrangements: Alone Available Help at Discharge: Family, Available PRN/intermittently Type of Home: House Home Access: Stairs to enter [employer] Steps: 6 Entrance Stairs-Rails: Right Home Layout: One level Bathroom Shower/Tub: TuChiropodistHandicapped height Home Equipment: WaEnvironmental consultant 2 wheels, [place] single point  Functional History: Prior Function Level of Independence: Independent, Independent with  assistive device(s) Comments: Pt reports that she was independent with ADLs, IADLs, and driving Functional Status:  Mobility: Bed Mobility Overal bed mobility: Needs Assistance Bed Mobility: Sit to Supine Supine to sit: Min assist Sit to supine: Min assist General bed mobility comments: Min A to manage LEs Transfers Overall transfer level: Needs assistance Equipment used: Rolling walker (2 wheeled), None, 1 person hand held assist Transfers: Sit to/from Stand Sit to Stand: Min assist, Min guard General transfer comment: Min A for stability with RW.  Ambulation/Gait Ambulation/Gait assistance: Min assist, Min guard Ambulation Distance (Feet): 35  Feet Assistive device: Rolling walker (2 wheeled) Gait Pattern/deviations: Shuffle General Gait Details: Pt needed min hand held assist to walk around the room without RW, when we switched to RW she was min guard assist.  Of note, she kept running into obstacles on her right side.  I will ask OT to further assess vision during their assessment.  Gait velocity: decreased Gait velocity interpretation: Below normal speed for age/gender    ADL: ADL Overall ADL's : Needs assistance/impaired Eating/Feeding: Set up, Sitting, Supervision/ safety Grooming: Min guard, Oral care, Standing, Cueing for sequencing Grooming Details (indicate cue type and reason): Pt performing oral care at sink with Min Guard for safety. Pt demosntrating poor functional performance with decreased balance, problem solving, awareness, FM skills, motor planning, and grasp strength. Pt first attempting to open tooth paste at wrong end and required significant amount of time to realize she needed to twist the cap side. Pt presenting with poor motor planning and targeted reaching as seen when reaching toward faucet and missing. Pt seems to perseverate when brushing teeth and would repeat steps several times including filling her cup with water, sipping, then pouring it out and repeating several more time. Pt also loosing her balance once during grooming at sink and reached for sink to stabilize herself.  Upper Body Bathing: Minimal assistance, Sitting Lower Body Bathing: Minimal assistance, Sit to/from stand Upper Body Dressing : Minimal assistance, Sitting Lower Body Dressing: Minimal assistance, Sit to/from stand Lower Body Dressing Details (indicate cue type and reason): Pt able to reach down for socks to pull them up. demonstrating poor FM skills during task and required increased time Toilet Transfer: Minimal assistance, Ambulation, RW Toilet Transfer Details (indicate cue type and reason): Simulated in room Functional mobility  during ADLs: Min guard, Rolling walker(poor awarness and bumping into object on R) General ADL Comments: Pt demonstrating decreased functional performance compare to baseline. Pt presenting with decreased balance, vision, cognition, FM skills, and motor planning.  Cognition: Cognition Overall Cognitive Status: Impaired/Different from baseline Arousal/Alertness: Awake/alert Orientation Level: Oriented X4 Attention: Selective Selective Attention: Appears intact Problem Solving: Impaired Problem Solving Impairment: Functional basic Cognition Arousal/Alertness: Awake/alert Behavior During Therapy: WFL for tasks assessed/performed(Pt occasionally laughing when not as appropriate) Overall Cognitive Status: Impaired/Different from baseline Area of Impairment: Attention, Following commands, Safety/judgement, Awareness, Problem solving Current Attention Level: Sustained Following Commands: Follows one step commands with increased time, Follows multi-step commands with increased time, Follows multi-step commands inconsistently Safety/Judgement: Decreased awareness of safety, Decreased awareness of deficits Awareness: Emergent Problem Solving: Slow processing, Requires verbal cues, Requires tactile cues General Comments: Pt with decreased cognition as seen by poor problem solving, attention, and awareness. Pt demonstrating decreased problem solving as seen during grooming at sink. Pt attempted to open tooth paste by twisting the bottom of the tooth paste instead of the cap - required increased time to fingure out she was turning the wrong side, then  pt awkwardly turn the cap without repositioning her hands. Pt would bumpRW into objects on R side and had difficulty problem solving to move RW  Blood pressure (!) 159/60, pulse 64, temperature 98.4 F (36.9 C), temperature source Oral, resp. rate (!) 0, height _0  (1.676 m), weight 100.7 kg (222 lb 0.1 oz), SpO2 99 %. Physical Exam  Vitals  reviewed. Constitutional: She appears well-developed and well-nourished.  HENT:  Head: Normocephalic and atraumatic.  Eyes: EOM are normal. Right eye exhibits no discharge. Left eye exhibits no discharge.  Neck: Normal range of motion. Neck supple. No thyromegaly present.  Cardiovascular:  Irregularly irregular  Respiratory: Effort normal and breath sounds normal. No respiratory distress.  GI: Soft. Bowel sounds are normal. She exhibits no distension.  Musculoskeletal: She exhibits no edema or tenderness.  Neurological: She is alert.  Patient makes good eye contact with examiner and follows full verbal commands.  She was able to slowly state her name and age as well as date of birth.  Motor: LUE: 5/5 proximal to distal  RUE: 4+/5 proximal to distal LLE: HE, KE 2/5 ADF/PF 4-/5 RLE: HE, KE 4-/5, ADF/PF 4/5  Skin: Skin is warm and dry.  Psychiatric: She has a normal mood and affect. Her behavior is normal.    No results found for this or any previous visit (from the past 24 hour(s)). Mr Brain Wo Contrast  Result Date: 03/03/2017 CLINICAL DATA:  Status post re- cannulization of LEFT middle cerebral artery thromboembolism. History of LEFT ICA aneurysm, LEFT carotid artery occlusion. EXAM: MRI HEAD WITHOUT CONTRAST TECHNIQUE: Multiplanar, multiecho pulse sequences of the brain and surrounding structures were obtained without intravenous contrast. COMPARISON:  CTA HEAD and neck March 02, 2017 and MRI of the head September 02, 2012 FINDINGS: BRAIN: Confluent LEFT parietal and posterior insula reduced diffusion with low ADC values. No susceptibility artifact to suggest hemorrhagic conversion. Overall large RIGHT basal ganglia infarct with mineralization, possible hemorrhagic etiology. Scattered chronic micro hemorrhages associated with chronic small vessel ischemic disease. Old bilateral thalamus and LEFT basal ganglia lacunar infarct. Multiple old small cerebellar infarcts. Small areas bilateral  frontal lobe encephalomalacia. Moderate to severe parenchymal brain volume loss. Ex vacuo dilatation RIGHT lateral ventricle, no hydrocephalus. Patchy supratentorial white matter FLAIR T2 hyperintensities. No midline shift, mass effect or masses. No abnormal extra-axial fluid collections. VASCULAR: Loss of LEFT vertebral artery flow void corresponding to known occlusion. SKULL AND UPPER CERVICAL SPINE: No abnormal sellar expansion. 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 No suspicious calvarial bone marrow signal. Craniocervical junction maintained. SINUSES/ORBITS: The mastoid air-cells and included paranasal sinuses are well-aerated. The included ocular globes and orbital contents are non-suspicious. OTHER: None. IMPRESSION: 1. 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 2. Old RIGHT basal ganglia infarct, likely hemorrhagic etiology. 3. Numerous old small and large vessel infarcts including small bifrontal/MCA territory infarcts. 4. Moderate chronic small vessel ischemic disease. 5. Acute findings text paged to [place] via AMION secure system on 03/03/2017 at 9:42 pm. Electronically Signed   By: Elon Alas M.D.   On: 03/03/2017 21:43    Assessment/Plan: Diagnosis: Left parietal MCA territory infarct Labs and images independently reviewed.  Records reviewed and summated above. Stroke: Continue secondary stroke prophylaxis and Risk Factor Modification listed below:   Antiplatelet therapy:   Blood Pressure Management:  Continue current medication with prn's with permisive HTN per primary team Statin Agent:   Right sided hemiparesis  Motor recovery: Fluoxetine  1. Does the need  for close, 24 hr/day medical supervision in concert with the patient's rehab needs make it unreasonable for this patient to be served in a less intensive setting? Yes  2. Co-Morbidities requiring supervision/potential complications: CAD/MI ischemic cardiomyopathy (cont meds), medical  compliance (counsel), CKD (avoid nephrotoxic meds) 3. Due to bladder management, disease management and patient education, does the patient require 24 hr/day rehab nursing? Yes 4. Does the patient require coordinated care of a physician, rehab nurse, PT (1-2 hrs/day, 5 days/week) and OT (1-2 hrs/day, 5 days/week) to address physical and functional deficits in the context of the above medical diagnosis(es)? Yes Addressing deficits in the following areas: balance, endurance, locomotion, strength, transferring, bathing, dressing, toileting and psychosocial support 5. Can the patient actively participate in an intensive therapy program of at least 3 hrs of therapy per day at least 5 days per week? Yes 6. The potential for patient to make measurable gains while on inpatient rehab is good 7. Anticipated functional outcomes upon discharge from inpatient rehab are modified independent and supervision  with PT, modified independent and supervision with OT, n/a with SLP. 8. Estimated rehab length of stay to reach the above functional goals is: 4-7 days.  9. Anticipated D/C setting: Home 10. Anticipated post D/C treatments: HH therapy and Home excercise program 11. Overall Rehab/Functional Prognosis: good  RECOMMENDATIONS: This patient's condition is appropriate for continued rehabilitative care in the following setting: CIR Patient has agreed to participate in recommended program. Yes Note that insurance prior authorization may be required for reimbursement for recommended care.  Comment: Rehab Admissions Coordinator to follow up.  Delice Lesch, MD, ABPMR Lavon Paganini Angiulli, PA-C 03/05/2017

## 2017-03-05 NOTE — Evaluation (Signed)
Occupational Therapy Evaluation Patient Details Name: Yvonne Bailey MRN: [id number] DOB: 08/17/1940 Today's Date: 03/05/2017    History of Present Illness 76 y.o. female admitted on 03/02/17 for AMS and speech difficulty.  CT and MRI confirmed L MCA territory infarct. Pt s/p bil common carotid anteriogram on 03/02/17.  Pt with significant PMH of PAD, MI, ischemic cardiomyopathy, HTN, DVT, CAD, and L leg fracture s/p pinning in 1994.   Clinical Impression   PTA, pt was living alone and was independent. Pt currently requiring Min A for ADLs and significant amount of time to complete tasks due to poor cognition, problem solving, vision, and motor planning. Pt presenting with visual fatigue and unable to maintain gaze during tracking tasks. Pt also presenting with poor motor planning, balance, and problem solving during ADLs. Due to pt's prior level of function, deficits, and motivation to return to PLOF, recommend dc to CIR for intensive OT to optimize return to PLOF and reduce fall risk. Will continue to follow acutely to facilitate safe dc.     Follow Up Recommendations  CIR;Supervision/Assistance - 24 hour    Equipment Recommendations  Other (comment)(Defer to next venue)    Recommendations for Other Services PT consult;Rehab consult;Speech consult     Precautions / Restrictions Precautions Precautions: Fall Precaution Comments: mildly unsteady on her feet.  Restrictions Weight Bearing Restrictions: No      Mobility Bed Mobility Overal bed mobility: Needs Assistance Bed Mobility: Sit to Supine     Supine to sit: Min assist Sit to supine: Min assist   General bed mobility comments: Min A to manage LEs  Transfers Overall transfer level: Needs assistance Equipment used: Rolling walker (2 wheeled);None;1 person hand held assist Transfers: Sit to/from Stand Sit to Stand: Min assist;Min guard         General transfer comment: Min A for stability with RW.      Balance Overall balance assessment: Needs assistance Sitting-balance support: No upper extremity supported;Feet supported Sitting balance-Leahy Scale: Good     Standing balance support: No upper extremity supported;Single extremity supported Standing balance-Leahy Scale: Fair                             ADL either performed or assessed with clinical judgement   ADL Overall ADL's : Needs assistance/impaired Eating/Feeding: Set up;Sitting;Supervision/ safety   Grooming: Min guard;Oral care;Standing;Cueing for sequencing Grooming Details (indicate cue type and reason): Pt performing oral care at sink with Min Guard for safety. Pt demosntrating poor functional performance with decreased balance, problem solving, awareness, FM skills, motor planning, and grasp strength. Pt first attempting to open tooth paste at wrong end and required significant amount of time to realize she needed to twist the cap side. Pt presenting with poor motor planning and targeted reaching as seen when reaching toward faucet and missing. Pt seems to perseverate when brushing teeth and would repeat steps several times including filling her cup with water, sipping, then pouring it out and repeating several more time. Pt also loosing her balance once during grooming at sink and reached for sink to stabilize herself.  Upper Body Bathing: Minimal assistance;Sitting   Lower Body Bathing: Minimal assistance;Sit to/from stand   Upper Body Dressing : Minimal assistance;Sitting   Lower Body Dressing: Minimal assistance;Sit to/from stand Lower Body Dressing Details (indicate cue type and reason): Pt able to reach down for socks to pull them up. demonstrating poor FM skills during task and required increased  time Toilet Transfer: Minimal assistance;Ambulation;RW Toilet Transfer Details (indicate cue type and reason): Simulated in room         Functional mobility during ADLs: Min guard;Rolling walker(poor awarness  and bumping into object on R) General ADL Comments: Pt demonstrating decreased functional performance compare to baseline. Pt presenting with decreased balance, vision, cognition, FM skills, and motor planning.     Vision Baseline Vision/History: Wears glasses;Glaucoma Wears Glasses: At all times Patient Visual Report: Other (comment);No change from baseline(Pt reports no change) Vision Assessment?: Yes;Vision impaired- to be further tested in functional context Eye Alignment: Within Functional Limits Ocular Range of Motion: Impaired-to be further tested in functional context(Pt with visual fatigue and unable to hold gaze laterally) Alignment/Gaze Preference: Within Defined Limits Tracking/Visual Pursuits: Decreased smoothness of vertical tracking;Decreased smoothness of horizontal tracking;Unable to hold eye position out of midline;Impaired - to be further tested in functional context(Pt with decreased smooth tracking and unable to maintain eye gaze during tracking laterally and horizontally) Additional Comments: Pt demonstrating decreased vision. Pt unable to maintain tracking to follow objects.      Perception     Praxis Praxis Praxis tested?: Deficits Praxis-Other Comments: Poor targeted reaching with RUE    Pertinent Vitals/Pain Pain Assessment: No/denies pain     Hand Dominance Right   Extremity/Trunk Assessment Upper Extremity Assessment Upper Extremity Assessment: RUE deficits/detail RUE Deficits / Details: RUE weakness with poor grasp strength. Pt unable to perform finger opposition due to poor motor planning. Pt also demonstrating poor motor planning during finger-to-nose test and was unable to perform targeted teaching; also seen at sink during grooming with poor reach toward faucet. RUE Coordination: decreased fine motor   Lower Extremity Assessment Lower Extremity Assessment: Defer to PT evaluation   Cervical / Trunk Assessment Cervical / Trunk Assessment: Normal    Communication Communication Communication: Expressive difficulties(Unable to recall DOB)   Cognition Arousal/Alertness: Awake/alert Behavior During Therapy: WFL for tasks assessed/performed(Pt occasionally laughing when not as appropriate) Overall Cognitive Status: Impaired/Different from baseline Area of Impairment: Attention;Following commands;Safety/judgement;Awareness;Problem solving                   Current Attention Level: Sustained   Following Commands: Follows one step commands with increased time;Follows multi-step commands with increased time;Follows multi-step commands inconsistently Safety/Judgement: Decreased awareness of safety;Decreased awareness of deficits Awareness: Emergent Problem Solving: Slow processing;Requires verbal cues;Requires tactile cues General Comments: Pt with decreased cognition as seen by poor problem solving, attention, and awareness. Pt demonstrating decreased problem solving as seen during grooming at sink. Pt attempted to open tooth paste by twisting the bottom of the tooth paste instead of the cap - required increased time to fingure out she was turning the wrong side, then pt awkwardly turn the cap without repositioning her hands. Pt would bumpRW into objects on R side and had difficulty problem solving to move RW   General Comments       Exercises     Shoulder Instructions      Home Living Family/patient expects to be discharged to:: Private residence Living Arrangements: Alone Available Help at Discharge: Family;Available PRN/intermittently Type of Home: House Home Access: Stairs to enter [employer] of Steps: 6 Entrance Stairs-Rails: Right Home Layout: One level     Bathroom Shower/Tub: Chief Strategy Officer: Handicapped height     Home Equipment: Environmental consultant - 2 wheels;Cane - single point          Prior Functioning/Environment Level of Independence: Independent;Independent with assistive device(s)  Comments: Pt reports that she was independent with ADLs, IADLs, and driving        OT Problem List: Decreased strength;Decreased range of motion;Decreased activity tolerance;Impaired balance (sitting and/or standing);Impaired vision/perception;Decreased cognition;Decreased coordination;Decreased safety awareness;Decreased knowledge of use of DME or AE;Decreased knowledge of precautions;Pain;Impaired UE functional use      OT Treatment/Interventions: Self-care/ADL training;Therapeutic exercise;Energy conservation;DME and/or AE instruction;Therapeutic activities;Patient/family education    OT Goals(Current goals can be found in the care plan section) Acute Rehab OT Goals Patient Stated Goal: to get better so she can return home OT Goal Formulation: With patient Time For Goal Achievement: 03/19/17 Potential to Achieve Goals: Good ADL Goals Pt Will Perform Grooming: with modified independence;standing Pt Will Perform Upper Body Dressing: with modified independence;sitting Pt Will Perform Lower Body Dressing: with modified independence;sit to/from stand Pt Will Transfer to Toilet: with modified independence;regular height toilet;ambulating Additional ADL Goal #1: Pt will recall and perform four ADL tasks with 1-2 VCs for safety  OT Frequency: Min 3X/week   Barriers to D/C:            Co-evaluation              AM-PAC PT "6 Clicks" Daily Activity     Outcome Measure Help from another person eating meals?: A Little Help from another person taking care of personal grooming?: A Little Help from another person toileting, which includes using toliet, bedpan, or urinal?: A Little Help from another person bathing (including washing, rinsing, drying)?: A Little Help from another person to put on and taking off regular upper body clothing?: A Little Help from another person to put on and taking off regular lower body clothing?: A Little 6 Click Score: 18   End of Session  Equipment Utilized During Treatment: Gait belt;Rolling walker Nurse Communication: Mobility status  Activity Tolerance: Patient tolerated treatment well Patient left: in bed;with call bell/phone within reach(with vascular lab)  OT Visit Diagnosis: Unsteadiness on feet (R26.81);Other abnormalities of gait and mobility (R26.89);Muscle weakness (generalized) (M62.81);Other symptoms and signs involving cognitive function;Hemiplegia and hemiparesis Hemiplegia - Right/Left: Right Hemiplegia - dominant/non-dominant: Dominant Hemiplegia - caused by: Cerebral infarction                Time: 1200-1226 OT Time Calculation (min): 26 min Charges:  OT General Charges $OT Visit: 1 Visit OT Evaluation $OT Eval Moderate Complexity: 1 Mod OT Treatments $Self Care/Home Management : 8-22 mins G-Codes:     Marguis Mathieson MSOT, OTR/L Acute Rehab Pager: [phone] Office: [phone]  Theodoro GristCharis M Jerauld Bostwick 03/05/2017, 1:19 PM

## 2017-03-06 ENCOUNTER — Encounter (HOSPITAL_COMMUNITY): Payer: Self-pay | Admitting: *Deleted

## 2017-03-06 ENCOUNTER — Other Ambulatory Visit: Payer: Self-pay

## 2017-03-06 ENCOUNTER — Inpatient Hospital Stay (HOSPITAL_COMMUNITY)
Admission: RE | Admit: 2017-03-06 | Discharge: 2017-03-18 | DRG: 057 | Disposition: A | Discharge status: Home Health | Payer: Medicare HMO | Source: Intra-hospital | Attending: Physical Medicine & Rehabilitation | Admitting: Physical Medicine & Rehabilitation

## 2017-03-06 DIAGNOSIS — Z9119 Patient's noncompliance with other medical treatment and regimen: Secondary | ICD-10-CM

## 2017-03-06 DIAGNOSIS — E785 Hyperlipidemia, unspecified: Secondary | ICD-10-CM | POA: Diagnosis present

## 2017-03-06 DIAGNOSIS — I63512 Cerebral infarction due to unspecified occlusion or stenosis of left middle cerebral artery: Secondary | ICD-10-CM | POA: Diagnosis not present

## 2017-03-06 DIAGNOSIS — I69320 Aphasia following cerebral infarction: Principal | ICD-10-CM

## 2017-03-06 DIAGNOSIS — I252 Old myocardial infarction: Secondary | ICD-10-CM | POA: Diagnosis not present

## 2017-03-06 DIAGNOSIS — R4701 Aphasia: Secondary | ICD-10-CM | POA: Diagnosis present

## 2017-03-06 DIAGNOSIS — I739 Peripheral vascular disease, unspecified: Secondary | ICD-10-CM | POA: Diagnosis present

## 2017-03-06 DIAGNOSIS — Z87891 Personal history of nicotine dependence: Secondary | ICD-10-CM

## 2017-03-06 DIAGNOSIS — I4891 Unspecified atrial fibrillation: Secondary | ICD-10-CM | POA: Diagnosis present

## 2017-03-06 DIAGNOSIS — E876 Hypokalemia: Secondary | ICD-10-CM | POA: Diagnosis present

## 2017-03-06 DIAGNOSIS — Z7901 Long term (current) use of anticoagulants: Secondary | ICD-10-CM | POA: Diagnosis not present

## 2017-03-06 DIAGNOSIS — I10 Essential (primary) hypertension: Secondary | ICD-10-CM | POA: Diagnosis not present

## 2017-03-06 DIAGNOSIS — I69398 Other sequelae of cerebral infarction: Secondary | ICD-10-CM

## 2017-03-06 DIAGNOSIS — E039 Hypothyroidism, unspecified: Secondary | ICD-10-CM | POA: Diagnosis present

## 2017-03-06 DIAGNOSIS — Z90710 Acquired absence of both cervix and uterus: Secondary | ICD-10-CM | POA: Diagnosis not present

## 2017-03-06 DIAGNOSIS — Z7902 Long term (current) use of antithrombotics/antiplatelets: Secondary | ICD-10-CM

## 2017-03-06 DIAGNOSIS — N183 Chronic kidney disease, stage 3 unspecified: Secondary | ICD-10-CM

## 2017-03-06 DIAGNOSIS — Z9861 Coronary angioplasty status: Secondary | ICD-10-CM | POA: Diagnosis not present

## 2017-03-06 DIAGNOSIS — Z79899 Other long term (current) drug therapy: Secondary | ICD-10-CM

## 2017-03-06 DIAGNOSIS — I639 Cerebral infarction, unspecified: Secondary | ICD-10-CM

## 2017-03-06 DIAGNOSIS — I129 Hypertensive chronic kidney disease with stage 1 through stage 4 chronic kidney disease, or unspecified chronic kidney disease: Secondary | ICD-10-CM | POA: Diagnosis present

## 2017-03-06 DIAGNOSIS — R4182 Altered mental status, unspecified: Secondary | ICD-10-CM | POA: Diagnosis present

## 2017-03-06 DIAGNOSIS — D72829 Elevated white blood cell count, unspecified: Secondary | ICD-10-CM | POA: Diagnosis not present

## 2017-03-06 DIAGNOSIS — R8290 Unspecified abnormal findings in urine: Secondary | ICD-10-CM

## 2017-03-06 DIAGNOSIS — E663 Overweight: Secondary | ICD-10-CM | POA: Diagnosis present

## 2017-03-06 DIAGNOSIS — Z833 Family history of diabetes mellitus: Secondary | ICD-10-CM

## 2017-03-06 DIAGNOSIS — Z91199 Patient's noncompliance with other medical treatment and regimen due to unspecified reason: Secondary | ICD-10-CM

## 2017-03-06 DIAGNOSIS — R2681 Unsteadiness on feet: Secondary | ICD-10-CM | POA: Diagnosis not present

## 2017-03-06 DIAGNOSIS — I2510 Atherosclerotic heart disease of native coronary artery without angina pectoris: Secondary | ICD-10-CM | POA: Diagnosis present

## 2017-03-06 DIAGNOSIS — I6350 Cerebral infarction due to unspecified occlusion or stenosis of unspecified cerebral artery: Secondary | ICD-10-CM | POA: Diagnosis not present

## 2017-03-06 DIAGNOSIS — I255 Ischemic cardiomyopathy: Secondary | ICD-10-CM | POA: Diagnosis present

## 2017-03-06 DIAGNOSIS — I482 Chronic atrial fibrillation, unspecified: Secondary | ICD-10-CM

## 2017-03-06 DIAGNOSIS — I6300 Cerebral infarction due to thrombosis of unspecified precerebral artery: Secondary | ICD-10-CM

## 2017-03-06 MED ORDER — LEVOTHYROXINE SODIUM 100 MCG PO TABS: 100.0000 ug | ORAL_TABLET | Freq: Every day | ORAL | Status: DC

## 2017-03-06 MED ORDER — SPIRONOLACTONE 25 MG PO TABS
25.0000 mg | ORAL_TABLET | Freq: Every day | ORAL | Status: DC
  Administered 2017-03-06: 25 mg via ORAL
  Filled 2017-03-06: qty 1

## 2017-03-06 MED ORDER — GUAIFENESIN-DM 100-10 MG/5ML PO SYRP: 5.0000 mL | ORAL_SOLUTION | Freq: Four times a day (QID) | ORAL | Status: DC | PRN

## 2017-03-06 MED ORDER — BISACODYL 10 MG RE SUPP: 10.0000 mg | Freq: Every day | RECTAL | Status: DC | PRN

## 2017-03-06 MED ORDER — POLYETHYLENE GLYCOL 3350 17 G PO PACK: 17.0000 g | PACK | Freq: Every day | ORAL | Status: DC | PRN

## 2017-03-06 MED ORDER — TRAZODONE HCL 50 MG PO TABS
25.0000 mg | ORAL_TABLET | Freq: Every evening | ORAL | Status: DC | PRN
  Administered 2017-03-12 – 2017-03-14 (×4): 50 mg via ORAL
  Filled 2017-03-06 (×4): qty 1

## 2017-03-06 MED ORDER — ATORVASTATIN CALCIUM 80 MG PO TABS
80.0000 mg | ORAL_TABLET | Freq: Every day | ORAL | Status: DC
  Administered 2017-03-07 – 2017-03-17 (×11): 80 mg via ORAL
  Filled 2017-03-06 (×10): qty 1

## 2017-03-06 MED ORDER — SPIRONOLACTONE 25 MG PO TABS
25.0000 mg | ORAL_TABLET | Freq: Every day | ORAL | Status: DC
  Administered 2017-03-07 – 2017-03-18 (×12): 25 mg via ORAL
  Filled 2017-03-06 (×13): qty 1

## 2017-03-06 MED ORDER — ALUM & MAG HYDROXIDE-SIMETH 200-200-20 MG/5ML PO SUSP: 30.0000 mL | ORAL | Status: DC | PRN

## 2017-03-06 MED ORDER — DIPHENHYDRAMINE HCL 12.5 MG/5ML PO ELIX: 12.5000 mg | ORAL_SOLUTION | Freq: Four times a day (QID) | ORAL | Status: DC | PRN

## 2017-03-06 MED ORDER — METOPROLOL SUCCINATE ER 50 MG PO TB24
50.0000 mg | ORAL_TABLET | Freq: Two times a day (BID) | ORAL | Status: DC
  Administered 2017-03-06 – 2017-03-18 (×24): 50 mg via ORAL
  Filled 2017-03-06 (×25): qty 1

## 2017-03-06 MED ORDER — FLEET ENEMA 7-19 GM/118ML RE ENEM: 1.0000 | ENEMA | Freq: Once | RECTAL | Status: DC | PRN

## 2017-03-06 MED ORDER — APIXABAN 5 MG PO TABS
5.0000 mg | ORAL_TABLET | Freq: Two times a day (BID) | ORAL | Status: DC
  Administered 2017-03-07 – 2017-03-18 (×23): 5 mg via ORAL
  Filled 2017-03-06 (×22): qty 1

## 2017-03-06 MED ORDER — PROCHLORPERAZINE EDISYLATE 5 MG/ML IJ SOLN: 5.0000 mg | Freq: Four times a day (QID) | INTRAMUSCULAR | Status: DC | PRN

## 2017-03-06 MED ORDER — METOPROLOL SUCCINATE ER 25 MG PO TB24
50.0000 mg | ORAL_TABLET | Freq: Two times a day (BID) | ORAL | Status: DC
  Administered 2017-03-06: 50 mg via ORAL
  Filled 2017-03-06: qty 2

## 2017-03-06 MED ORDER — PROCHLORPERAZINE MALEATE 5 MG PO TABS: 5.0000 mg | ORAL_TABLET | Freq: Four times a day (QID) | ORAL | Status: DC | PRN

## 2017-03-06 MED ORDER — LEVOTHYROXINE SODIUM 100 MCG PO TABS
100.0000 ug | ORAL_TABLET | Freq: Every day | ORAL | Status: DC
  Administered 2017-03-07 – 2017-03-18 (×12): 100 ug via ORAL
  Filled 2017-03-06 (×12): qty 1

## 2017-03-06 MED ORDER — PROCHLORPERAZINE 25 MG RE SUPP: 12.5000 mg | Freq: Four times a day (QID) | RECTAL | Status: DC | PRN

## 2017-03-06 MED ORDER — ATORVASTATIN CALCIUM 80 MG PO TABS
80.0000 mg | ORAL_TABLET | Freq: Every day | ORAL | Status: DC
  Administered 2017-03-06: 80 mg via ORAL
  Filled 2017-03-06: qty 1

## 2017-03-06 MED ORDER — ACETAMINOPHEN 325 MG PO TABS: 325.0000 mg | ORAL_TABLET | ORAL | Status: DC | PRN

## 2017-03-06 MED ORDER — BENAZEPRIL HCL 20 MG PO TABS
20.0000 mg | ORAL_TABLET | Freq: Every day | ORAL | Status: DC
  Administered 2017-03-07 – 2017-03-09 (×3): 20 mg via ORAL
  Filled 2017-03-06 (×6): qty 1

## 2017-03-06 MED ORDER — BENAZEPRIL HCL 20 MG PO TABS
20.0000 mg | ORAL_TABLET | Freq: Every day | ORAL | Status: DC
  Administered 2017-03-06: 20 mg via ORAL
  Filled 2017-03-06: qty 1

## 2017-03-06 NOTE — Progress Notes (Signed)
Pt admitted to 4M06 with son at bedside. Pt oriented to unit. Vitals are within normal limits and patient is in no pain. Continue plan of care.

## 2017-03-06 NOTE — Progress Notes (Signed)
Rehab admissions - I have called and opened the case with [facility] medicare requesting acute inpatient rehab admission.  I will await call back from insurance case manager.  Call me for questions.  #[phone]

## 2017-03-06 NOTE — IPOC Note (Addendum)
Overall Plan of Care [facility]) Patient Details Name: Yvonne Bailey MRN: [id number] DOB: 04-13-1940  Admitting Diagnosis: Left MCA stroke  Hospital Problems: Active Problems:   Acute ischemic left MCA stroke (HCC)   Global aphasia   Benign essential HTN   Acute ischemic cerebrovascular accident (CVA) involving left middle cerebral artery territory (HCC)   Leukocytosis     Functional Problem List: Nursing Endurance, Bladder, Bowel, Pain, Skin Integrity, Sensory, Medication Management, Motor  PT Behavior, Balance, Edema, Endurance, Motor, Nutrition, Perception, Safety, Sensory, Skin Integrity  OT Balance, Perception, Behavior, Safety, Cognition, Sensory, Skin Integrity, Endurance, Motor, Vision, Pain  SLP Cognition, Linguistic  TR         Basic ADL's: OT Grooming, Bathing, Dressing, Toileting     Advanced  ADL's: OT       Transfers: PT Bed Mobility, Bed to Chair, Car, [employer], Civil Service fast streamer, Research scientist (life sciences): PT Ambulation, Stairs, Psychologist, prison and probation services     Additional Impairments: OT Fuctional Use of Upper Extremity  SLP Communication expression, comprehension Problem Solving, Awareness, Attention  TR      Anticipated Outcomes Item Anticipated Outcome  Self Feeding n/a  Swallowing      Basic self-care  supervision  Toileting  supervision   Bathroom Transfers supervision  Bowel/Bladder  Pt will manage bowel and bladder with min assist at discharge .  Transfers  Mod I with LRAD   Locomotion  Supervision assist with LRAD   Communication  Supervision  Cognition  Supervision   Pain  Pt will manage pain at 3 or less on a scale of 0-10.   Safety/Judgment  Pt will remain free of falls and injury with supervision assist while in rehab.    Therapy Plan: PT Intensity: Minimum of 1-2 x/day ,45 to 90 minutes PT Frequency: 5 out of 7 days PT Duration Estimated Length of Stay: 10-14 days  OT Intensity: Minimum of 1-2 x/day, 45 to 90 minutes OT  Frequency: 5 out of 7 days OT Duration/Estimated Length of Stay: ~10-12 days SLP Intensity: Minumum of 1-2 x/day, 30 to 90 minutes SLP Frequency: 3 to 5 out of 7 days SLP Duration/Estimated Length of Stay: 10-12 days     Team Interventions: Nursing Interventions Patient/Family Education, Disease Management/Prevention, Discharge Planning, Cognitive Remediation/Compensation, Dysphagia/Aspiration Precaution Training, Medication Management, Bladder Management, Pain Management  PT interventions Ambulation/gait training, Balance/vestibular training, Cognitive remediation/compensation, Community reintegration, Disease management/prevention, Discharge planning, DME/adaptive equipment instruction, Functional electrical stimulation, Functional mobility training, Neuromuscular re-education, Pain management, Patient/family education, Psychosocial support, Skin care/wound management, Splinting/orthotics, Stair training, Therapeutic Activities, Therapeutic Exercise, Visual/perceptual remediation/compensation, UE/LE Coordination activities, UE/LE Strength taining/ROM, Wheelchair propulsion/positioning  OT Interventions Balance/vestibular training, Discharge planning, Functional electrical stimulation, Pain management, Self Care/advanced ADL retraining, Therapeutic Activities, UE/LE Coordination activities, Cognitive remediation/compensation, Disease mangement/prevention, Functional mobility training, Patient/family education, Skin care/wound managment, Therapeutic Exercise, Visual/perceptual remediation/compensation, Firefighter, Fish farm manager, Neuromuscular re-education, Psychosocial support, UE/LE Strength taining/ROM  SLP Interventions Cognitive remediation/compensation, Environmental controls, Internal/external aids, Speech/Language facilitation, Therapeutic Activities, Patient/family education, Functional tasks, Cueing hierarchy  TR Interventions    SW/CM Interventions Discharge  Planning, Psychosocial Support, Patient/Family Education   Barriers to Discharge MD  Medical stability  Nursing      PT      OT      SLP      SW       Team Discharge Planning: Destination: PT-Home ,OT- Home , SLP-Home Projected Follow-up: PT-Home health PT, OT-  Home health OT, SLP-24 hour supervision/assistance,  Home Health SLP Projected Equipment Needs: PT-Rolling walker with 5" wheels, To be determined, OT- To be determined, SLP-None recommended by SLP Equipment Details: PT- , OT-  Patient/family involved in discharge planning: PT- Patient,  OT-Patient, SLP-Patient  MD ELOS: 10-12 days. Medical Rehab Prognosis:  Good Assessment: 76 y.o. RH femalewith history of ICM/CAD, HTN, DVT-on Eliquisand question of medical compliance. She was admitted on 03/02/2017 with acute onset of expressive aphasia with question of confusion. CTA head showed low flow enhancement of proximal M2 brach suspicious for occlusion, occluded L-VA, moderate carotid siphon atherosclerosis with moderate multifocal stenosis. CT perfusion with left frontal cortex and subcortical white matter infarct.  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 Dr. Pearlean BrownieSethi felt that [facility]-MCA branch infarct embolic due to A fib and question of compliance--patient to continue Eliquis per discussion with family. Patient with expressive > receptive aphasia with mild deficits in speech as well as balance deficits. Will set goals for Supervision with PT/OT/SLP.   See Team Conference Notes for weekly updates to the plan of care

## 2017-03-06 NOTE — Progress Notes (Signed)
STROKE TEAM PROGRESS NOTE   HISTORY OF PRESENT ILLNESS (per record) Yvonne SIMMERMAN is an 76 y.o. female presenting to the ED via EMS with acute onset of expressive aphasia. There was question of confusion as well versus incorrect responses to questions due to dysphasia. LKN 1515. TOSO 1530. Per nursing note: "Pt A&O to person only. Pt takes few minutes to try and answer questions then asked "what was there question again". Pt has said several times "I am fine"."  The has a history of DVT and takes Eliquis. Also takes Plavix and atorvastatin.  PMHx includes CAD, prior MI, carotid artery disease, DVT, HTN, HLD, ischemic cardiomyopathy with low EF and PAD.   LSN: [id number] tPA Given: No: On oral anticoagulation NIHSS: 5  S/P bilatera;l common carotid arteriogram. RT CFA approach. Findings. 1.Slowly recanalizing ant parietal branch of inf division  Of Lt MCA M2/M3 junction. 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  She remains neurologically stable. Blood pressure is adequately controlled. Speech is improving slowly. She is participating in therapies.  Dr. Pearlean Brownie had long discussion with son regarding plan of care over the phone.  OBJECTIVE Temp:  [98 F (36.7 C)-98.5 F (36.9 C)] 98.2 F (36.8 C) (12/27 1143) Pulse Rate:  [60-82] 77 (12/27 1143) Cardiac Rhythm: (P) Atrial fibrillation (12/27 0900) Resp:  [16-18] 16 (12/27 1143) BP: (136-157)/(74-85) 152/85 (12/27 1143) SpO2:  [99 %-100 %] 99 % (12/27 1143)  CBC:  Recent Labs  Lab 03/02/17 1716 03/02/17 1717  WBC 9.9  --   NEUTROABS 6.3  --   HGB 13.8 15.0  HCT 42.3 44.0  MCV 96.1  --   PLT 257  --    Basic Metabolic Panel:  Recent Labs  Lab 03/02/17 1716 03/02/17 1717 03/04/17 0250  NA 139 144 140  K 3.1* 3.1* 3.7  CL 104 103 106  CO2 27  --  25  GLUCOSE 115* 115* 115*  BUN 14 14 8   CREATININE 1.14* 1.00 1.10*  CALCIUM  9.1  --  8.8*   Lipid Panel:     Component Value Date/Time   CHOL 151 03/03/2017 0551   TRIG 62 03/03/2017 0551   HDL 45 03/03/2017 0551   CHOLHDL 3.4 03/03/2017 0551   VLDL 12 03/03/2017 0551   LDLCALC 94 03/03/2017 0551   HgbA1c:  Lab Results  Component Value Date   HGBA1C 5.4 03/03/2017   Urine Drug Screen:     Component Value Date/Time   LABOPIA NONE DETECTED 03/02/2017 1707   COCAINSCRNUR NONE DETECTED 03/02/2017 1707   LABBENZ NONE DETECTED 03/02/2017 1707   AMPHETMU NONE DETECTED 03/02/2017 1707   THCU NONE DETECTED 03/02/2017 1707   LABBARB NONE DETECTED 03/02/2017 1707    Alcohol Level No results found for: ETH  IMAGING  Cerebral Angiogram - Dr Corliss Skains SUBJECTIVE:/P bilatera;l common carotid arteriogram RT CFA approach. Findings. 1.Slowly recanalizing ant parietal branch of inf division  Of Lt MCA M2/M3 junction. 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 Loss of flow related enhancement of a proximal left M2 branch, suspicious for occlusion.  2. Otherwise negative CTA for large vessel occlusion.  3. Occluded left vertebral artery. Dominant right vertebral artery patent to the vertebrobasilar junction.  4. Moderate carotid siphon atherosclerosis with moderate multifocal stenoses.  5. Carotid bifurcation atherosclerosis without flow-limiting stenosis.   Ct Cerebral Perfusion W Contrast 03/02/2017 IMPRESSION:  Perfusion mismatch volume of 24 mL, corresponding to CT abnormality of the LEFT MCA branch subserving the LEFT frontal cortex and subcortical white matter. No core infarct volume is established.   Dg Chest Port 1 View 03/03/2017 IMPRESSION:  Cardiomegaly without edema or consolidation. There is aortic atherosclerosis. Aortic Atherosclerosis   Ct Head Code Stroke Wo Contrast 03/02/2017 IMPRESSION:  1. No acute  intracranial infarct or other process identified.  2. ASPECTS is 10.  3. Multiple remote lacunar infarcts involving the bilateral basal ganglia, right cerebral hemisphere, and bilateral cerebellar hemispheres as above.  4. Atrophy with chronic small vessel ischemic disease.  5. 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   Transthoracic Echocardiogram Complete Bubble Study - Left ventricle: There is a false tendon in the mid LV of no   clinical significance. LVF appears reduced but poor acoustical   windows prevent accurate assessment of EF or 03/03/2017  S /P bilatera;l common carotid arteriogram. RT CFA approach. Findings. 1.Slowly recanalizing ant parietal branch of inf division  Of Lt MCA M2/M3 junction. 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 2. Old RIGHT basal ganglia infarct, likely hemorrhagic etiology. 3. Numerous old small and large vessel infarcts including small bifrontal/MCA territory infarcts  PHYSICAL EXAM Vitals:   03/05/17 1436 03/05/17 2025 03/06/17 0600 03/06/17 1143  BP: 136/80 (!) 157/74 (!) 149/79 (!) 152/85  Pulse: 60 82 70 77  Resp: 16 18 16 16   Temp: 98 F (36.7 C) 98.5 F (36.9 C) 98.5 F (36.9 C) 98.2 F (36.8 C)  TempSrc: Oral Oral Oral Oral  SpO2: 99% 100% 99% 99%  Weight:      Height:       Pleasant elderly African-American lady not in distress. Afebrile. Head is nontraumatic. Neck is supple without bruit. Cardiac exam no murmur or gallop. Lungs are clear to auscultation. Distal pulses are well felt.   Neurological exam Awake alert mild  expressive aphasia, can speak short sentences. Difficulty with naming and repetition. Comprehension seems better preserved. Nonfluent speech. Follows commands well. Extraocular moments are full range without nystagmus. Decreased blink to threat on the  right. Right lower facial weakness. Tongue midline. Fundi not visualized. Motor system exam no upper or lower extremity drift. Fine finger movements slightly diminished on the right. Moves both lower extremities minimally off the bed but cooperation is limited.  ASSESSMENT/PLAN Ms. Yvonne Bailey is a 76 y.o. female with history of DVT on Eliquis, coronary artery disease with previous MI, carotid artery disease, hypertension, lipidemia, ischemic cardiomyopathy and peripheral artery disease presenting with aphasia and possible confusion. She did not receive IV t-PA due to anticoagulation.  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 Multiple remote infarcts. MRI head - 1. 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 2. Old RIGHT basal ganglia infarct, likely hemorrhagic etiology. 3. Numerous old small and large vessel infarcts including small bifrontal/MCA territory infarcts MRA head - not performed  Carotid Doppler - CTA neck 2D Echo -Left ventricle: There is a false tendon in the mid LV of no clinical significance. LVF appears reduced but poor  acoustical windows prevent accurate assessment of EF or   LDL - 94  HgbA1c - 5.4  VTE prophylaxis - SCDs - Eliquis PTA Diet regular Room service appropriate? Yes; Fluid consistency: Thin  Eliquis (apixaban) daily prior to admission but compliance not clear, now on eliquis  Patient counseled to be compliant with her antithrombotic medications  Ongoing aggressive stroke risk factor management  Therapy recommendations:  CIR Disposition:  CIR - insurance pending, hopefully transfer in AM  03/05/2017: She has presented with aphasia secondary to left MCA branch infarct of embolic etiology from atrial fibrillation it is unclear whether she was compliant with her  apixaban.  Resume eliquis..Rehab consult. Transfer to neurology floor bed today. No family available at the bedside for discussion..  03/06/2017: Neuro exam remained stable.  Patient continues to have moderate expressive aphasia.  Working with therapies.  Dr. Pearlean BrownieSethi had long discussion with son on the phone this morning and plan of care reviewed.  Decision made to continue Eliquis and family will make sure patient is compliant with medications.  CIR consulted.  Insurance pending.  Hopefully discharge in a.m.  Hypertension  Stable but occasionally high with known aneurysm  Permissive hypertension (OK if < 220/120) but gradually normalize in 5-7 days  Long-term BP goal normotensive Resumed Lotensin, Metoprolol and Aldactone 03/05/17  Hyperlipidemia  Home meds: Lipitor 80 mg daily resumed in hospital  LDL 94, goal < 70  Continue Lipitor - restarted 03/05/17  Continue statin at discharge  Other Stroke Risk Factors  Advanced age  Former cigarette smoker - quit - 26 years ago  Obesity, Body mass index is 35.83 kg/m., recommend weight loss, diet and exercise as appropriate   Hx stroke/TIA - by imaging  Family hx stroke (Brother)  Coronary artery disease -will need outpatient cardiology follow-up to determine if Plavix should be restarted.  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   Plan / Recommendations  Resume eliquis.and patient counselled about compliance  Hospital day # 4  Brita RompMary A Costello, ANP-C Neurology Stroke Team 03/06/2017 1:20 PM . Patient will be transferred to inpatient rehabilitation later today when bed available. Continue eliquis for stroke prevention. I had a long discussion with the patient's son over the phone and  answered questions. Follow-up as an outpatient in stroke clinic in 6 weeks. Delia HeadyPramod Sethi, MD Medical Director [facility] Pager: [phone] 03/06/2017 2:07 PM  To contact Stroke Continuity provider, please refer to [email or web address]. After hours, contact General Neurology

## 2017-03-06 NOTE — Care Management Important Message (Signed)
Important Message  Patient Details  Name: Yvonne Bailey MRN: [id number] Date of Birth: 09-Mar-1941   Medicare Important Message Given:  Yes    [name] 03/06/2017, 11:48 AM

## 2017-03-06 NOTE — Progress Notes (Signed)
Rehab admissions - I have approval for acute inpatient rehab admission for today and medical clearance for CIR.  Patient is agreeable.  Bed available and will admit to inpatient rehab today.  Call me for questions.  #[phone]

## 2017-03-06 NOTE — Care Management Note (Signed)
Case Management Note  Patient Details  Name: Yvonne Bailey MRN: [id number] Date of Birth: 1940-06-20  Subjective/Objective:                    Action/Plan: Patient d/cing to CIR today. No further needs per CM.   Expected Discharge Date:  03/06/17               Expected Discharge Plan:  IP Rehab Facility  In-House Referral:     Discharge planning Services  CM Consult  Post Acute Care Choice:    Choice offered to:     DME Arranged:    DME Agency:     HH Arranged:    HH Agency:     Status of Service:  Completed, signed off  If discussed at [employer], dates discussed:    Additional Comments:  Kermit Balo, RN 03/06/2017, 4:27 PM

## 2017-03-06 NOTE — Progress Notes (Signed)
Physical Therapy Treatment Patient Details Name: Yvonne Bailey MRN: [id number] DOB: 06/11/1940 Today's Date: 03/06/2017    History of Present Illness 76 y.o. female admitted on 03/02/17 for AMS and speech difficulty.  CT and MRI confirmed L MCA territory infarct. Pt s/p bil common carotid anteriogram on 03/02/17.  Pt with significant PMH of PAD, MI, ischemic cardiomyopathy, HTN, DVT, CAD, and L leg fracture s/p pinning in 1994.    PT Comments    Pt is making slow progress towards her goals today. Pt is limited by decreased stability with gait, and decreased attention to objects on R.  Pt is currently minA for bed mobility, transfers and ambulation of 200 feet with RW. D/c plans remain appropriate. PT will continue to follow acutely.    Follow Up Recommendations  CIR     Equipment Recommendations  None recommended by PT    Recommendations for Other Services       Precautions / Restrictions Precautions Precautions: Fall Precaution Comments: mildly unsteady on her feet.  Restrictions Weight Bearing Restrictions: No    Mobility  Bed Mobility Overal bed mobility: Needs Assistance Bed Mobility: Supine to Sit     Supine to sit: Min assist Sit to supine: Min assist   General bed mobility comments: minA for LE management to floor and back into bed  Transfers Overall transfer level: Needs assistance Equipment used: Rolling walker (2 wheeled);None;1 person hand held assist Transfers: Sit to/from Stand Sit to Stand: Min assist;Min guard         General transfer comment: minA for sit<>stand from bed, min guard from toilet  Ambulation/Gait Ambulation/Gait assistance: Min assist Ambulation Distance (Feet): 200 Feet Assistive device: Rolling walker (2 wheeled) Gait Pattern/deviations: Shuffle Gait velocity: decreased Gait velocity interpretation: Below normal speed for age/gender General Gait Details: minA for steadying with RW, vc for navigation around obstacles on  R.      Modified Rankin (Stroke Patients Only) Modified Rankin (Stroke Patients Only) Pre-Morbid Rankin Score: Slight disability Modified Rankin: Moderately severe disability     Balance Overall balance assessment: Needs assistance Sitting-balance support: No upper extremity supported;Feet supported Sitting balance-Leahy Scale: Good     Standing balance support: No upper extremity supported;Single extremity supported;During functional activity Standing balance-Leahy Scale: Fair                              Cognition Arousal/Alertness: Awake/alert Behavior During Therapy: WFL for tasks assessed/performed Overall Cognitive Status: Impaired/Different from baseline Area of Impairment: Attention;Following commands;Safety/judgement                   Current Attention Level: Selective   Following Commands: Follows multi-step commands inconsistently;Follows multi-step commands with increased time Safety/Judgement: Decreased awareness of safety;Decreased awareness of deficits Awareness: Emergent Problem Solving: Slow processing;Requires verbal cues;Requires tactile cues           General Comments General comments (skin integrity, edema, etc.): Pt grandaughter present at end of session Pt not able to recount people who had visited today      Pertinent Vitals/Pain Pain Assessment: No/denies pain    Home Living Family/patient expects to be discharged to:: Private residence Living Arrangements: Alone Available Help at Discharge: Family;Available PRN/intermittently Type of Home: House Home Access: Stairs to enter Entrance Stairs-Rails: Right Home Layout: One level Home Equipment: Walker - 2 wheels;Cane - single point      Prior Function Level of Independence: Independent;Independent with assistive device(s)  Comments: uses cane for community access, still drives,    PT Goals (current goals can now be found in the care plan section) Acute Rehab PT  Goals Patient Stated Goal: to get better so she can return home PT Goal Formulation: With patient Time For Goal Achievement: 03/19/17 Potential to Achieve Goals: Good Progress towards PT goals: Progressing toward goals    Frequency    Min 4X/week      PT Plan Current plan remains appropriate       AM-PAC PT "6 Clicks" Daily Activity  Outcome Measure  Difficulty turning over in bed (including adjusting bedclothes, sheets and blankets)?: Unable Difficulty moving from lying on back to sitting on the side of the bed? : Unable Difficulty sitting down on and standing up from a chair with arms (e.g., wheelchair, bedside commode, etc,.)?: Unable Help needed moving to and from a bed to chair (including a wheelchair)?: A Little Help needed walking in hospital room?: A Little Help needed climbing 3-5 steps with a railing? : A Lot 6 Click Score: 11    End of Session Equipment Utilized During Treatment: Gait belt Activity Tolerance: Patient tolerated treatment well Patient left: in chair;with call bell/phone within reach;with chair alarm set Nurse Communication: Mobility status;Other (comment)(to Hydrologist) PT Visit Diagnosis: Unsteadiness on feet (R26.81);Difficulty in walking, not elsewhere classified (R26.2);Other symptoms and signs involving the nervous system (Z30.865)     Time: 7846-9629 PT Time Calculation (min) (ACUTE ONLY): 19 min  Charges:  $Gait Training: 8-22 mins                    G Codes:       Cyndy B. Beverely Risen PT, DPT Acute Rehabilitation  [phone] Pager [phone]     KINSELY LAFRENIER Fleet 03/06/2017, 3:05 PM

## 2017-03-06 NOTE — PMR Pre-admission (Signed)
PMR Admission Coordinator Pre-Admission Assessment  Patient: Yvonne Bailey is an 76 y.o., female MRN: [id number] DOB: Aug 10, 1940 Height: _0  (167.6 cm) Weight: 100.7 kg (222 lb 0.1 oz)           Insurance Information HMO: Yes    PPO:       PCP:       IPA:       80/20:       OTHER:    PRIMARY: [employer]      Policy#: [id number]      Subscriber: patient CM Name:  Johnney Ou      Phone#: [phone]     Fax#: [phone] Pre-Cert#: [id number]      Employer: Retired Benefits:  Phone #: [phone]     Name:  [name]. Date: 03/11/16     Deduct:  $0      Out of Pocket Max: $5900 (met $400)      Life Max: N/A CIR: $295 days 1-6      SNF: $0 days 1-20; $167.00 days 21-100 Outpatient:  Medical necessity     Co-Pay: $40/visit Home Health: 100%      Co-Pay: none DME: 80%     Co-Pay: 20% Providers: in network  Emergency Contact Information Contact Information    Name Relation Home Work Mobile   Rogers,[name] Son   [place], [place]   [phone]     Current Medical History  Patient Admitting Diagnosis: Left parietal MCA territory infarct    History of Present Illness: A 76 y.o. RH femalewith history of ICM/CAD, HTN, DVT-on Eliquisand question of medical compliance. She was admitted on 03/02/2017 with acute onset of expressive aphasia with question of confusion. CTA head showed low flow enhancement of proximal M2 brach suspicious for occlusion, occluded L-VA, moderate carotid siphon atherosclerosis with moderate multifocal stenosis. CT perfusion with left frontal cortex and subcortical white matter infarct.  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  2D echo with no false tendon in mid LV and LVF appears reduced with poor  acoustical windows. 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 Dr. Leonie Man felt that [facility] branch infarct embolic due to A fib and question of compliance--patient to continue Eliquis per discussion with family. BLE dopplers without DVT.     Patient with expressive > receptive aphasia with mild deficits in speech as well as balance deficits. CIR recommended due to decline in functional status.   Total: 1=NIH  Past Medical History  Past Medical History:  Diagnosis Date  . CAD (coronary artery disease)    S/P  anterior  wall myocardial infarction in '92, treated w/percutaneous transluminal coronary angioplasty of the left anterior descending. EF 45%  . Carotid artery disease ([place])   . DVT (deep venous thrombosis) ([place])    X1  . History of nuclear stress test    a. Myoview 6/17: EF 20-25%, mid anteroseptal, apical anterior, apical septal, apical inferior, apical lateral and apical scar, no ischemia, intermediate risk  . HTN (hypertension)   . Hyperlipidemia   . Hypothyroidism   . Ischemic cardiomyopathy    a. Echo 6/17: EF 20-25%, apex  appears akinetic, MAC, moderate MR, moderate LAE, mild RVE, trivial PI, PASP 47 mmHg (needs repeat with Definity contrast) //  b. Limited echo with Definity contrast 7/17: EF 25-30%, moderate to severe LAE  . MI (myocardial infarction) ([place]) 1992   at age 76; TIA treated with coumadin until Plavix initiated in 2006  . PAD (peripheral artery disease) ([facility])    Right SFA occlusion, severe disease left CFA and SFA    Family History  family history includes Coronary artery disease in her brother; Diabetes in her mother; Hypertension in her mother; Stroke in her brother.  Prior Rehab/Hospitalizations: No rehab admissions  Has the patient had major surgery during 100 days prior to admission? No  Current Medications    Current Facility-Administered Medications:  .  [DISCONTINUED] acetaminophen (TYLENOL) tablet 650 mg, 650 mg, Oral, Q4H PRN **OR** acetaminophen (TYLENOL) solution 650 mg, 650 mg, Per Tube, Q4H PRN **OR** [DISCONTINUED] acetaminophen (TYLENOL) suppository 650 mg, 650 mg, Rectal, Q4H PRN, Aroor, Karena Addison R, MD .  apixaban (ELIQUIS) tablet 5 mg, 5 mg, Oral, Q12H, Wright, Donalynn Furlong, RPH, 5 mg at 03/06/17 0530 .  atorvastatin (LIPITOR) tablet 80 mg, 80 mg, Oral, Daily, Costello, Mary A, NP .  benazepril (LOTENSIN) tablet 20 mg, 20 mg, Oral, Daily, Costello, Mary A, NP .  Derrill Memo ON 03/07/2017] levothyroxine (SYNTHROID, LEVOTHROID) tablet 100 mcg, 100 mcg, Oral, QAC breakfast, Costello, Mary A, NP .  lidocaine (PF) (XYLOCAINE) 1 % injection, , , PRN, Deveshwar, Sanjeev, MD, 10 mL at 03/02/17 2211 .  metoprolol succinate (TOPROL-XL) 24 hr tablet 50 mg, 50 mg, Oral, BID, Costello, Mary A, NP .  spironolactone (ALDACTONE) tablet 25 mg, 25 mg, Oral, Daily, Costello, Mary A, NP  Patients Current Diet: Diet regular Room service appropriate? Yes; Fluid consistency: Thin  Precautions / Restrictions Precautions Precautions: Fall Precaution Comments: mildly unsteady on her feet.  Restrictions Weight Bearing Restrictions: No   Has the patient had 2 or more falls or a fall with injury in the past year?No  Prior Activity Level Limited Community (1-2x/wk): Went out 3 X a week, was driving.  Home Assistive Devices / Equipment Home Equipment: Environmental consultant - 2 wheels, Cane - single point  Prior Device Use: Indicate devices/aids used by the patient prior to current illness, exacerbation or injury? Cane  Prior Functional Level Prior Function Level of Independence: Independent, Independent with assistive device(s) Comments: uses cane for community access, still drives,   Self Care: Did the patient need help bathing, dressing, using the toilet or eating?  Independent  Indoor Mobility: Did the patient need  assistance with walking from room to room (with or without device)? Independent  Stairs: Did the patient need assistance with internal or external stairs (with or without device)? Independent  Functional Cognition: Did the patient need help planning regular tasks such as shopping or remembering to take medications? Independent  Current Functional Level Cognition  Arousal/Alertness: Awake/alert Overall Cognitive Status: Impaired/Different from baseline Current Attention Level: Selective Orientation Level: Oriented X4 Following Commands: Follows multi-step commands inconsistently, Follows multi-step commands with increased time Safety/Judgement: Decreased awareness of safety, Decreased awareness of deficits General Comments: Pt with decreased cognition as seen by poor problem solving, attention, and awareness. Pt demonstrating decreased problem solving as seen during grooming at sink. Pt attempted to open tooth paste by twisting the bottom of the tooth paste instead of the cap - required increased time to fingure out she was turning the wrong side, then pt awkwardly turn the cap without repositioning  her hands. Pt would bumpRW into objects on R side and had difficulty problem solving to move RW Attention: Selective Selective Attention: Appears intact Problem Solving: Impaired Problem Solving Impairment: Functional basic    Extremity Assessment (includes Sensation/Coordination)  Upper Extremity Assessment: RUE deficits/detail RUE Deficits / Details: RUE weakness with poor grasp strength. Pt unable to perform finger opposition due to poor motor planning. Pt also demonstrating poor motor planning during finger-to-nose test and was unable to perform targeted teaching; also seen at sink during grooming with poor reach toward faucet. RUE Coordination: decreased fine motor  Lower Extremity Assessment: Defer to PT evaluation    ADLs  Overall ADL's : Needs assistance/impaired Eating/Feeding: Set  up, Sitting, Supervision/ safety Grooming: Min guard, Oral care, Standing, Cueing for sequencing Grooming Details (indicate cue type and reason): Pt performing oral care at sink with Min Guard for safety. Pt demosntrating poor functional performance with decreased balance, problem solving, awareness, FM skills, motor planning, and grasp strength. Pt first attempting to open tooth paste at wrong end and required significant amount of time to realize she needed to twist the cap side. Pt presenting with poor motor planning and targeted reaching as seen when reaching toward faucet and missing. Pt seems to perseverate when brushing teeth and would repeat steps several times including filling her cup with water, sipping, then pouring it out and repeating several more time. Pt also loosing her balance once during grooming at sink and reached for sink to stabilize herself.  Upper Body Bathing: Minimal assistance, Sitting Lower Body Bathing: Minimal assistance, Sit to/from stand Upper Body Dressing : Minimal assistance, Sitting Lower Body Dressing: Minimal assistance, Sit to/from stand Lower Body Dressing Details (indicate cue type and reason): Pt able to reach down for socks to pull them up. demonstrating poor FM skills during task and required increased time Toilet Transfer: Minimal assistance, Ambulation, RW Toilet Transfer Details (indicate cue type and reason): Simulated in room Functional mobility during ADLs: Min guard, Rolling walker(poor awarness and bumping into object on R) General ADL Comments: Pt demonstrating decreased functional performance compare to baseline. Pt presenting with decreased balance, vision, cognition, FM skills, and motor planning.    Mobility  Overal bed mobility: Needs Assistance Bed Mobility: Supine to Sit Supine to sit: Min assist Sit to supine: Min assist General bed mobility comments: minA for LE management to floor and back into bed    Transfers  Overall transfer  level: Needs assistance Equipment used: Rolling walker (2 wheeled), None, 1 person hand held assist Transfers: Sit to/from Stand Sit to Stand: Min assist, Min guard General transfer comment: minA for sit<>stand from bed, min guard from toilet    Ambulation / Gait / Stairs / Wheelchair Mobility  Ambulation/Gait Ambulation/Gait assistance: Museum/gallery curator (Feet): 200 Feet Assistive device: Rolling walker (2 wheeled) Gait Pattern/deviations: Shuffle General Gait Details: minA for steadying with RW, vc for navigation around obstacles on R.  Gait velocity: decreased Gait velocity interpretation: Below normal speed for age/gender    Posture / Balance Balance Overall balance assessment: Needs assistance Sitting-balance support: No upper extremity supported, Feet supported Sitting balance-Leahy Scale: Good Standing balance support: No upper extremity supported, Single extremity supported, During functional activity Standing balance-Leahy Scale: Fair    Special needs/care consideration BiPAP/CPAP NO CPM No Continuous Drip IV No  Dialysis No        Life Vest No Oxygen No Special Bed No Trach Size No Wound Vac (area) No      Skin  No                             Bowel mgmt: WDL, but not recent documented BM Bladder mgmt: Voiding up on BSC Diabetic mgmt No    Previous Home Environment Living Arrangements: Alone Available Help at Discharge: Family, Available PRN/intermittently Type of Home: House Home Layout: One level Home Access: Stairs to enter Entrance Stairs-Rails: Right Entrance Stairs-Number of Steps: 6 Bathroom Shower/Tub: Chiropodist: Handicapped height  Discharge Living Setting Plans for Discharge Living Setting: Patient's home, Alone, House(Lives alone.) Type of Home at Discharge: House Discharge Home Layout: One level Discharge Home Access: Stairs to enter Entrance Stairs-Number of Steps: 5-6 steps  Social/Family/Support  Systems Patient Roles: Parent, Other (Comment)(Has a son and a grand daughter.) Contact Information: See emergency contacts Anticipated Caregiver: Roxy Cedar - granddaughter Anticipated Caregiver's Contact Information: Delana Meyer [phone] Ability/Limitations of Caregiver: Rozanna Boer daughter is not working and can assist.  She may stay at patient's home Caregiver Availability: Other (Comment)(May have 24/7 coverage initially through grand daughter) Discharge Plan Discussed with Primary Caregiver: Yes Is Caregiver In Agreement with Plan?: Yes Does Caregiver/Family have Issues with Lodging/Transportation while Pt is in Rehab?: No  Goals/Additional Needs Patient/Family Goal for Rehab: PT/OT mod I and supervision goals Expected length of stay: 4-7 days Cultural Considerations: None Dietary Needs: Regular diet, thin liquids Equipment Needs: TBD Pt/Family Agrees to Admission and willing to participate: Yes Program Orientation Provided & Reviewed with Pt/Caregiver Including Roles  & Responsibilities: Yes  Decrease burden of Care through IP rehab admission: N/A  Possible need for SNF placement upon discharge: Not anticipated  Patient Condition: This patient's condition remains as documented in the consult dated 03/05/17, in which the Rehabilitation Physician determined and documented that the patient's condition is appropriate for intensive rehabilitative care in an inpatient rehabilitation facility. Will admit to inpatient rehab today.  Preadmission Screen Completed By:  Retta Diones, 03/06/2017 4:19 PM ______________________________________________________________________   Discussed status with Dr. Letta Pate on 03/06/17 at 1619 and received telephone approval for admission today.  Admission Coordinator:  Retta Diones, time 1622/Date 03/06/18

## 2017-03-06 NOTE — Discharge Summary (Signed)
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 CAD (coronary artery disease)    S/P  anterior  wall myocardial infarction in '92, treated w/percutaneous transluminal coronary angioplasty of the left anterior descending. EF 45%  . Carotid artery disease ([facility])   . DVT (deep venous thrombosis) ([place])    X1  . History of nuclear stress test    a. Myoview 6/17: EF 20-25%, mid anteroseptal, apical anterior, apical septal, apical inferior, apical lateral and apical scar, no ischemia, intermediate risk  . HTN (hypertension)   . Hyperlipidemia   . Hypothyroidism   . Ischemic cardiomyopathy    a. Echo 6/17: EF 20-25%, apex appears akinetic, MAC, moderate MR, moderate LAE, mild RVE, trivial PI, PASP 47 mmHg (needs repeat with Definity contrast) //  b. Limited echo with Definity contrast 7/17: EF 25-30%, moderate to severe LAE  . MI (myocardial infarction) ([place]) 1992   at age 36; TIA treated with coumadin until Plavix initiated in 2006  . PAD (peripheral artery disease) (HCC)    Right SFA occlusion, severe disease left CFA and SFA   Past Surgical History:  Procedure Laterality Date  . APPENDECTOMY     at hysterectomy and USO for fibroids, Dr. Ysidro Evert  . CARDIAC CATHETERIZATION  1992   Dr Eustace Quail  . CARDIAC CATHETERIZATION  N/A 09/06/2015   Procedure: Right/Left Heart Cath and Coronary Angiography;  Surgeon: Burnell Blanks, MD;  Location: [place] [facility];  Service: Cardiovascular;  Laterality: N/A;  . CARDIAC CATHETERIZATION N/A 09/07/2015   Procedure: Coronary Stent Intervention;  Surgeon: Burnell Blanks, MD;  Location: [place] [facility];  Service: Cardiovascular;  Laterality: N/A;  . COLONOSCOPY     negative; 2008, Dr. Delfin Edis  . fracture LLE     '94; pinned  . IR ANGIO INTRA EXTRACRAN SEL COM CAROTID INNOMINATE BILAT MOD SED  03/02/2017  . IR ANGIO VERTEBRAL SEL VERTEBRAL UNI R MOD SED  03/02/2017  . RADIOLOGY WITH ANESTHESIA N/A 03/02/2017   Procedure: RADIOLOGY WITH ANESTHESIA;  Surgeon: Luanne Bras, MD;  Location: [facility];  Service: Radiology;  Laterality: N/A;  . TONSILLECTOMY    . TOTAL ABDOMINAL HYSTERECTOMY     & BSO for fibroids    Medications to be continued on Rehab . apixaban  5 mg Oral Q12H  . atorvastatin  80 mg Oral Daily  . benazepril  20 mg Oral Daily  . [START ON 03/07/2017] levothyroxine  100 mcg Oral QAC breakfast  . metoprolol succinate  50 mg Oral BID  . spironolactone  25 mg Oral Daily    LABORATORY STUDIES CBC    Component Value Date/Time   WBC 9.9 03/02/2017 1716   RBC 4.40 03/02/2017 1716   HGB 15.0 03/02/2017 1717   HCT 44.0 03/02/2017 1717   PLT 257 03/02/2017 1716   MCV 96.1  03/02/2017 1716   MCH 31.4 03/02/2017 1716   MCHC 32.6 03/02/2017 1716   RDW 13.1 03/02/2017 1716   LYMPHSABS 2.7 03/02/2017 1716   MONOABS 0.6 03/02/2017 1716   EOSABS 0.2 03/02/2017 1716   BASOSABS 0.2 (H) 03/02/2017 1716   CMP    Component Value Date/Time   NA 140 03/04/2017 0250   K 3.7 03/04/2017 0250   CL 106 03/04/2017 0250   CO2 25 03/04/2017 0250   GLUCOSE 115 (H) 03/04/2017 0250   BUN 8 03/04/2017 0250   CREATININE 1.10 (H) 03/04/2017 0250   CREATININE 1.14 (H) 09/15/2015 1025   CALCIUM 8.8 (L) 03/04/2017 0250   PROT 7.1 03/02/2017 1716    ALBUMIN 4.0 03/02/2017 1716   AST 28 03/02/2017 1716   ALT 19 03/02/2017 1716   ALKPHOS 116 03/02/2017 1716   BILITOT 0.7 03/02/2017 1716   GFRNONAA 48 (L) 03/04/2017 0250   GFRAA 55 (L) 03/04/2017 0250   COAGS Lab Results  Component Value Date   INR 1.09 03/02/2017   INR 1.2 (H) 09/01/2015   Lipid Panel    Component Value Date/Time   CHOL 151 03/03/2017 0551   TRIG 62 03/03/2017 0551   HDL 45 03/03/2017 0551   CHOLHDL 3.4 03/03/2017 0551   VLDL 12 03/03/2017 0551   LDLCALC 94 03/03/2017 0551   HgbA1C  Lab Results  Component Value Date   HGBA1C 5.4 03/03/2017   Urinalysis    Component Value Date/Time   COLORURINE YELLOW 03/02/2017 1707   APPEARANCEUR CLEAR 03/02/2017 1707   LABSPEC 1.009 03/02/2017 1707   PHURINE 5.0 03/02/2017 1707   GLUCOSEU NEGATIVE 03/02/2017 1707   GLUCOSEU NEGATIVE 10/11/2014 1618   HGBUR NEGATIVE 03/02/2017 1707   HGBUR negative 10/26/2007 1057   BILIRUBINUR NEGATIVE 03/02/2017 1707   BILIRUBINUR Negative 07/09/2012 [place] 03/02/2017 1707   PROTEINUR NEGATIVE 03/02/2017 1707   UROBILINOGEN 1.0 10/11/2014 1618   NITRITE NEGATIVE 03/02/2017 1707   LEUKOCYTESUR NEGATIVE 03/02/2017 1707   Urine Drug Screen     Component Value Date/Time   LABOPIA NONE DETECTED 03/02/2017 1707   COCAINSCRNUR NONE DETECTED 03/02/2017 1707   LABBENZ NONE DETECTED 03/02/2017 1707   AMPHETMU NONE DETECTED 03/02/2017 1707   THCU NONE DETECTED 03/02/2017 1707   LABBARB NONE DETECTED 03/02/2017 1707    Alcohol Level No results found for: ETH  SIGNIFICANT DIAGNOSTIC STUDIES Cerebral Angiogram - Dr Estanislado Pandy SUBJECTIVE:/P bilatera;l common carotid arteriogram RT CFA approach. Findings. 1.Slowly recanalizing ant parietal branch of inf division Of Lt MCA M2/M3 junction. 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  Loss of flow related enhancement of a proximal left M2 branch, suspicious for occlusion.  2. Otherwise negative CTA for large vessel occlusion.  3. Occluded left vertebral artery. Dominant right vertebral artery patent to the vertebrobasilar junction.  4. Moderate carotid siphon atherosclerosis with moderate multifocal stenoses.  5. Carotid bifurcation atherosclerosis without flow-limiting stenosis.   Ct Cerebral Perfusion W Contrast 03/02/2017 IMPRESSION:  Perfusion mismatch volume of 24 mL, corresponding to CT abnormality of the LEFT MCA branch subserving the LEFT frontal cortex and subcortical white matter. No core infarct volume is established.   Dg Chest Port 1 View 03/03/2017 IMPRESSION:  Cardiomegaly without edema or consolidation. There is aortic atherosclerosis. Aortic Atherosclerosis   Ct Head Code Stroke Wo Contrast 03/02/2017 IMPRESSION:  1.  No acute intracranial infarct or other process identified.  2. ASPECTS is 10.  3. Multiple remote lacunar infarcts involving the bilateral basal ganglia, right cerebral hemisphere, and bilateral cerebellar hemispheres as above.  4. Atrophy with chronic small vessel ischemic disease.  5. 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   Transthoracic Echocardiogram Complete Bubble Study - Left ventricle: There is a false tendon in the mid LV of no clinical significance. LVF appears reduced but poor acoustical windows prevent accurate assessment of EF or 03/03/2017  S /P bilatera;l common carotid arteriogram. RT CFA approach. Findings. 1.Slowly recanalizing ant parietal branch of inf division Of Lt MCA M2/M3 junction. 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 2. Old RIGHT basal ganglia infarct, likely hemorrhagic etiology. 3. Numerous old small and large vessel  infarcts including small bifrontal/MCA territory infarcts  HISTORY OF PEvergreenASSESSMENT/PLAN Ms. ETAMORAH HADAis a 76y.o. female with history of DVT on Eliquis, coronary artery disease with previous MI, carotid artery disease, hypertension, lipidemia, ischemic cardiomyopathy and peripheral artery disease presenting with aphasia and possible confusion. She did not receive IV t-PA due to anticoagulation.  Stroke: Lt MCA  Branch infarct - S /P bilatera;l common carotid arteriogram.  embolic -known atrial fibrillation she was supposed to be on eliquis but compliance not clear  Resultant  aphasia  CT head - No acute intracranial infarct or other process identified. Multiple remote infarcts. MRI head - 1. 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 2. Old RIGHT basal ganglia infarct, likely hemorrhagic etiology. 3. Numerous old small and large vessel infarcts including small bifrontal/MCA territory infarcts MRA head - not performed  Carotid Doppler - CTA neck 2D Echo -Left ventricle: There is a false tendon in the mid LV of no clinical significance. LVF appears reduced but poor acoustical windows prevent accurate assessment of EF or   LDL - 94  HgbA1c - 5.4  VTE prophylaxis - SCDs - Eliquis PTA  Diet regular Room service appropriate? Yes; Fluid consistency: Thin  Eliquis (apixaban) daily prior to admission but compliance not clear, now on eliquis  Patient counseled to be compliant with her antithrombotic medications  Ongoing aggressive stroke risk factor management  Therapy recommendations:  CIR Disposition:  CIR - today  03/05/2017: She has presented with aphasia secondary to left MCA branch infarct of embolic etiology from atrial fibrillation it is unclear whether she was compliant with her apixaban.  Resume eliquis..Rehab consult. Transfer to neurology floor bed today. No family available at the bedside for  discussion..  03/06/2017: Neuro exam remained stable.  Patient continues to have moderate expressive aphasia.  Working with therapies.  Dr. SLeonie Manhad long discussion with son on the phone this morning and plan of care reviewed.  Decision made to continue Eliquis and family will make sure patient is compliant with medications.  CIR consulted. Bed available, patient medically stable for transfer today  Attending Note: Patient will be transferred to inpatient rehabilitation later today when bed available. Continue eliquis for stroke prevention. I had a long discussion with the patient's son over the phone and answered questions. Follow-up as an outpatient in stroke clinic in 6 weeks.  Hypertension  Stable but occasionally high with known aneurysm              Permissive hypertension (OK  if < 220/120) but gradually normalize in 5-7 days              Long-term BP goal normotensive  Resumed Lotensin, Metoprolol and Aldactone 03/05/17  Hyperlipidemia  Home meds: Lipitor 80 mg daily resumed in hospital  LDL 94, goal < 70  Continue Lipitor - restarted 03/05/17  Continue statin at discharge  Other Stroke Risk Factors  Advanced age  Former cigarette smoker - quit - 26 years ago  Obesity, Body mass index is 35.83 kg/m., recommend weight loss, diet and exercise as appropriate   Hx stroke/TIA - by imaging  Family hx stroke (Brother)  Coronary artery disease -will need outpatient cardiology follow-up to determine if Plavix should be restarted.  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  DISCHARGE EXAM Blood pressure (!) 148/78, pulse 80, temperature 98.9 F (37.2 C), temperature source Oral, resp. rate 18, height _0  (1.676 m), weight 100.7 kg  (222 lb 0.1 oz), SpO2 99 %. Pleasant elderly African-American lady not in distress. Afebrile. Head is nontraumatic. Neck is supple without bruit. Cardiac exam no murmur or gallop. Lungs are clear to auscultation. Distal pulses are well felt.   Neurological exam Awake alert mild  expressive aphasia, can speak short sentences. Difficulty with naming and repetition. Comprehension seems better preserved. Nonfluent speech. Follows commands well. Extraocular moments are full range without nystagmus. Decreased blink to threat on the right. Right lower facial weakness. Tongue midline. Fundi not visualized. Motor system exam no upper or lower extremity drift. Fine finger movements slightly diminished on the right. Moves both lower extremities minimally off the bed but cooperation is limited.  Discharge Diet  Diet regular Room service appropriate? Yes; Fluid consistency: Thin liquids  DISCHARGE PLAN  Disposition:  Transfer to [place] ongoing PT, OT and ST  Eliquis (apixaban) daily for secondary stroke prevention.  Recommend ongoing risk factor control by Primary Care Physician at time of discharge from inpatient rehabilitation.  Follow-up BBinnie Rail MD in 2 weeks following discharge from rehab.  Follow-up with Dr. [facility] in 6 weeks, office to schedule an appointment.  SGarvin Fila MD  Neurology Radiology [phone] [address] [place] [address]  Greater than  35 minutes were spent preparing discharge.  MMary Sella ANP-C Neurology Stroke Team 03/06/2017 3:13 PM  I have personally examined this patient, reviewed notes, independently viewed imaging studies, participated in medical decision making and plan of care.ROS completed by me personally and pertinent positives fully documented  I have made any additions or clarifications directly to the above note. Agree with note above.    PAntony Contras  MD Medical Director [facility] Pager: [phone]/27/2018 3:54 PM

## 2017-03-07 ENCOUNTER — Inpatient Hospital Stay (HOSPITAL_COMMUNITY): Payer: Medicare HMO | Admitting: Occupational Therapy

## 2017-03-07 ENCOUNTER — Inpatient Hospital Stay (HOSPITAL_COMMUNITY): Payer: Medicare HMO

## 2017-03-07 ENCOUNTER — Inpatient Hospital Stay (HOSPITAL_COMMUNITY): Payer: Medicare HMO | Admitting: Physical Therapy

## 2017-03-07 ENCOUNTER — Inpatient Hospital Stay (HOSPITAL_COMMUNITY): Payer: Medicare HMO | Admitting: Speech Pathology

## 2017-03-07 DIAGNOSIS — R4701 Aphasia: Secondary | ICD-10-CM

## 2017-03-07 DIAGNOSIS — I69320 Aphasia following cerebral infarction: Secondary | ICD-10-CM | POA: Insufficient documentation

## 2017-03-07 DIAGNOSIS — I2510 Atherosclerotic heart disease of native coronary artery without angina pectoris: Secondary | ICD-10-CM

## 2017-03-07 DIAGNOSIS — I10 Essential (primary) hypertension: Secondary | ICD-10-CM

## 2017-03-07 DIAGNOSIS — N183 Chronic kidney disease, stage 3 (moderate): Secondary | ICD-10-CM

## 2017-03-07 DIAGNOSIS — I63512 Cerebral infarction due to unspecified occlusion or stenosis of left middle cerebral artery: Secondary | ICD-10-CM

## 2017-03-07 DIAGNOSIS — E039 Hypothyroidism, unspecified: Secondary | ICD-10-CM

## 2017-03-07 LAB — COMPREHENSIVE METABOLIC PANEL
ALT: 18 U/L (ref 14–54)
AST: 33 U/L (ref 15–41)
Albumin: 3.1 g/dL — ABNORMAL LOW (ref 3.5–5.0)
Alkaline Phosphatase: 97 U/L (ref 38–126)
Anion gap: 9 (ref 5–15)
BUN: 20 mg/dL (ref 6–20)
CO2: 24 mmol/L (ref 22–32)
Calcium: 8.5 mg/dL — ABNORMAL LOW (ref 8.9–10.3)
Chloride: 106 mmol/L (ref 101–111)
Creatinine, Ser: 1.08 mg/dL — ABNORMAL HIGH (ref 0.44–1.00)
GFR calc Af Amer: 56 mL/min — ABNORMAL LOW (ref >60)
GFR calc non Af Amer: 49 mL/min — ABNORMAL LOW (ref >60)
Glucose, Bld: 101 mg/dL — ABNORMAL HIGH (ref 65–99)
Potassium: 3.7 mmol/L (ref 3.5–5.1)
Sodium: 139 mmol/L (ref 135–145)
Total Bilirubin: 0.8 mg/dL (ref 0.3–1.2)
Total Protein: 6.3 g/dL — ABNORMAL LOW (ref 6.5–8.1)

## 2017-03-07 LAB — CBC WITH DIFFERENTIAL/PLATELET
Basophils Absolute: 0.1 10*3/uL (ref 0.0–0.1)
Basophils Relative: 1 %
Eosinophils Absolute: 0.3 10*3/uL (ref 0.0–0.7)
Eosinophils Relative: 2 %
HCT: 40.4 % (ref 36.0–46.0)
Hemoglobin: 13 g/dL (ref 12.0–15.0)
Lymphocytes Relative: 29 %
Lymphs Abs: 3.1 10*3/uL (ref 0.7–4.0)
MCH: 31.5 pg (ref 26.0–34.0)
MCHC: 32.2 g/dL (ref 30.0–36.0)
MCV: 97.8 fL (ref 78.0–100.0)
Monocytes Absolute: 0.9 10*3/uL (ref 0.1–1.0)
Monocytes Relative: 8 %
Neutro Abs: 6.6 10*3/uL (ref 1.7–7.7)
Neutrophils Relative %: 60 %
Platelets: 200 10*3/uL (ref 150–400)
RBC: 4.13 MIL/uL (ref 3.87–5.11)
RDW: 13.7 % (ref 11.5–15.5)
WBC: 10.9 10*3/uL — ABNORMAL HIGH (ref 4.0–10.5)

## 2017-03-07 NOTE — Evaluation (Signed)
Speech Language Pathology Assessment and Plan  Patient Details  Name: Yvonne Bailey MRN: [id number] Date of Birth: 1940-07-20  SLP Diagnosis: Aphasia;Cognitive Impairments  Rehab Potential: Excellent ELOS: 10-12 days     Today's Date: 03/07/2017 SLP Individual Time: 1300-1400 SLP Individual Time Calculation (min): 60 min   Problem List:  Patient Active Problem List   Diagnosis Date Noted  . Global aphasia   . Benign essential HTN   . Acute ischemic cerebrovascular accident (CVA) involving left middle cerebral artery territory [facility])   . Acute ischemic left MCA stroke ([place]) 03/06/2017  . Expressive aphasia   . Coronary artery disease involving native coronary artery without angina pectoris   . Stage 3 chronic kidney disease ([place])   . Medically noncompliant   . Stroke (cerebrum) ([place]) 03/02/2017  . Prediabetes 07/28/2016  . DOE (dyspnea on exertion) 07/09/2016  . Gait instability 07/09/2016  . Itching 04/16/2016  . Unstable angina ([place]) 09/06/2015  . Cardiomyopathy, ischemic   . Chronic systolic heart failure ([place]) 08/21/2015  . Bilateral leg edema 07/19/2015  . Atrial fibrillation ([place]) 07/19/2015  . Urinary urgency 03/30/2015  . Other musculoskeletal symptoms referable to limbs(729.89) 12/17/2012  . Lacunar infarction 12/17/2012  . Small vessel disease, cerebrovascular 12/17/2012  . CAROTID BRUIT, RIGHT 08/10/2008  . Peripheral vascular disease ([place]) 06/04/2007  . Diverticulosis of large intestine 06/04/2007  . Hypothyroidism 02/24/2007  . Hyperlipidemia 02/24/2007  . Essential hypertension 02/24/2007  . DVT 01/21/2007  . Coronary atherosclerosis 10/29/2006   Past Medical History:  Past Medical History:  Diagnosis Date  . CAD (coronary artery disease)    S/P  anterior  wall myocardial infarction in '92, treated w/percutaneous transluminal coronary angioplasty of the left anterior descending. EF 45%  . Carotid artery disease ([place])   . DVT (deep venous  thrombosis) ([place])    X1  . History of nuclear stress test    a. Myoview 6/17: EF 20-25%, mid anteroseptal, apical anterior, apical septal, apical inferior, apical lateral and apical scar, no ischemia, intermediate risk  . HTN (hypertension)   . Hyperlipidemia   . Hypothyroidism   . Ischemic cardiomyopathy    a. Echo 6/17: EF 20-25%, apex appears akinetic, MAC, moderate MR, moderate LAE, mild RVE, trivial PI, PASP 47 mmHg (needs repeat with Definity contrast) //  b. Limited echo with Definity contrast 7/17: EF 25-30%, moderate to severe LAE  . MI (myocardial infarction) ([place]) 1992   at age 29; TIA treated with coumadin until Plavix initiated in 2006  . PAD (peripheral artery disease) (HCC)    Right SFA occlusion, severe disease left CFA and SFA   Past Surgical History:  Past Surgical History:  Procedure Laterality Date  . APPENDECTOMY     at hysterectomy and USO for fibroids, Dr. Ysidro Evert  . CARDIAC CATHETERIZATION  1992   Dr Eustace Quail  . CARDIAC CATHETERIZATION N/A 09/06/2015   Procedure: Right/Left Heart Cath and Coronary Angiography;  Surgeon: Burnell Blanks, MD;  Location: [facility];  Service: Cardiovascular;  Laterality: N/A;  . CARDIAC CATHETERIZATION N/A 09/07/2015   Procedure: Coronary Stent Intervention;  Surgeon: Burnell Blanks, MD;  Location: [place] [facility];  Service: Cardiovascular;  Laterality: N/A;  . COLONOSCOPY     negative; 2008, Dr. Delfin Edis  . fracture LLE     '94; pinned  . IR ANGIO INTRA EXTRACRAN SEL COM CAROTID INNOMINATE BILAT MOD SED  03/02/2017  . IR ANGIO VERTEBRAL SEL VERTEBRAL UNI R MOD SED  03/02/2017  . RADIOLOGY WITH ANESTHESIA N/A 03/02/2017   Procedure: RADIOLOGY WITH ANESTHESIA;  Surgeon: Luanne Bras, MD;  Location: [facility];  Service: Radiology;  Laterality: N/A;  . TONSILLECTOMY    . TOTAL ABDOMINAL HYSTERECTOMY     & BSO for fibroids    Assessment / Plan / Recommendation Clinical Impression is a 76 y.o. RH  femalewith history of ICM/CAD, HTN, DVT-on Eliquisand question of medical compliance. She was admitted on 03/02/2017 with acute onset of expressive aphasia with question of confusion. CTA head showed low flow enhancement of proximal M2 brach suspicious for occlusion, occluded L-VA, moderate carotid siphon atherosclerosis with moderate multifocal stenosis. CT perfusion with left frontal cortex and subcortical white matter infarct.  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  2D echo with poor acoustical windows. 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 Dr. Leonie Man felt that [facility] branch infarct embolic due to A fib and question of compliance--patient to continue Eliquis per discussion with family. BLE dopplers without DVT.  Patient with expressive > receptive aphasia with mild deficits in speech as well as balance deficits. CIR recommended due to decline in functional status and patient admitted 03/06/17.  Patient present with a mild aphasia characterized by impaired auditory comprehension of complex information and multi-step commands. Patient's verbal expression is characterized by phonemic and semantic paraphasias with ability to self-monitor and correct errors in 75% of opportunities.  A moderate agraphia was also present but reading comprehension appeared intact.  Patient also demonstrated impaired problem solving and decreased attention to right field of environment/RUE during functional tasks. Patient would benefit from skilled SLP intervention to maximize her cognitive-linguistic function and overall functional independence prior to  discharge home.       Skilled Therapeutic Interventions          Patient administered a cognitive-linguistic evaluation. Please see above for details.    SLP Assessment  Patient will need skilled [facility] during CIR admission    Recommendations  Oral Care Recommendations: Oral care BID Patient destination: Home Follow up Recommendations: 24 hour supervision/assistance;Home Health SLP Equipment Recommended: None recommended by SLP    SLP Frequency 3 to 5 out of 7 days   SLP Duration  SLP Intensity  SLP Treatment/Interventions 10-12 days   Minumum of 1-2 x/day, 30 to 90 minutes  Cognitive remediation/compensation;Environmental controls;Internal/external aids;Speech/Language facilitation;Therapeutic Activities;Patient/family education;Functional tasks;Cueing hierarchy    Pain Pain Assessment Pain Assessment: No/denies pain Pain Score: 0-No pain  Prior Functioning Type of Home: House  Lives With: Alone Available Help at Discharge: Family;Available PRN/intermittently  Function:   Cognition Comprehension Comprehension assist level: Follows basic conversation/direction with extra time/assistive device  Expression   Expression assist level: Expresses basic 75 - 89% of the time/requires cueing 10 - 24% of the time. Needs helper to occlude trach/needs to repeat words.  Social Interaction Social Interaction assist level: Interacts appropriately 90% of the time - Needs monitoring or encouragement for participation or interaction.  Problem Solving Problem solving assist level: Solves basic 50 - 74% of the time/requires cueing 25 - 49% of the time  Memory Memory assist level: Recognizes or recalls 90% of the time/requires cueing <  10% of the time   Short Term Goals: Week 1: SLP Short Term Goal 1 (Week 1): Patient will self-monitor and correct verbal errors in 90% of opportunities with supervision verbal cues.  SLP Short Term Goal 2 (Week 1): Patient will  complete divergent naming tasks with Max A multimodal cues.  SLP Short Term Goal 3 (Week 1): Patient will demonstrate written expression at the basic CVC level with Mod A multimodal cues.  SLP Short Term Goal 4 (Week 1): Patient will demonstrate functional problem solving for basic and familiar tasks with Min A multimodal cues.  SLP Short Term Goal 5 (Week 1): Patient will attend to RUE/enviornment during functional tasks with Min A verbal cues.   Refer to Care Plan for Long Term Goals  Recommendations for other services: None   Discharge Criteria: Patient will be discharged from SLP if patient refuses treatment 3 consecutive times without medical reason, if treatment goals not met, if there is a change in medical status, if patient makes no progress towards goals or if patient is discharged from hospital.  The above assessment, treatment plan, treatment alternatives and goals were discussed and mutually agreed upon: by patient  Norah Devin 03/07/2017, 3:16 PM

## 2017-03-07 NOTE — Plan of Care (Signed)
  Progressing Consults RH STROKE PATIENT EDUCATION Description See Patient Education module for education specifics  03/07/2017 1445 - Progressing by Dani Gobble, RN RH BLADDER ELIMINATION RH STG MANAGE BLADDER WITH ASSISTANCE Description STG Manage Bladder With Min Assistance  03/07/2017 1445 - Progressing by Dani Gobble, RN RH SKIN INTEGRITY RH STG SKIN FREE OF INFECTION/BREAKDOWN Description No new breakdown with min assist   03/07/2017 1445 - Progressing by Dani Gobble, RN RH SAFETY RH STG ADHERE TO SAFETY PRECAUTIONS W/ASSISTANCE/DEVICE Description STG Adhere to Safety Precautions With min Assistance/Device.  03/07/2017 1445 - Progressing by Dani Gobble, RN

## 2017-03-07 NOTE — Progress Notes (Signed)
Jamse Arn, MD  Physician  Physical Medicine and Rehabilitation  Consult Note  Signed  Date of Service:  03/05/2017 1:37 PM       Related encounter: ED to Hosp-Admission (Discharged) from 03/02/2017 in [facility]      Signed      Expand All Collapse All       _0 Hide copied text  _1 Hover for details        Physical Medicine and Rehabilitation Consult Reason for Consult: Altered mental status and speech difficulty Referring Physician: Dr. Leonie Man   HPI: Yvonne Bailey is a 76 y.o. right handed female with history of CAD/MI ischemic cardiomyopathy, hypertension, hyperlipidemia, DVT maintained on Eliquis and question medical compliance. Presented 03/02/2017 with acute onset of expressive aphasia. Per chart review patient lives alone independent with assistive device prior to admission. Independent with ADLs and still driving. One level home with 6 steps to entry. She has a son and granddaughter in the area that works. Cranial CT scan reviewed, unremarkable for acute process. Per report, multiple remote lacunar infarcts involving bilateral basal ganglia and right cerebral hemisphere. Patient did not receive TPA. CT angiogram head and neck showed loss of flow related enhancement of proximal left M2 branch suspicious for occlusion otherwise negative CTA for large vessel occlusion. 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 Bilateral lower extremity Dopplers negative. Echocardiogram showed no regional abnormalities. 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 Occupational therapy evaluation completed with recommendations of physical medicine rehabilitation  consult.   Review of Systems  Constitutional: Negative for chills and fever.  HENT: Negative for hearing loss.   Eyes: Negative for blurred vision and double vision.  Respiratory: Negative for cough.   Cardiovascular: Negative for chest pain, palpitations and leg swelling.  Gastrointestinal: Positive for constipation. Negative for nausea and vomiting.  Genitourinary: Negative for dysuria, flank pain and hematuria.  Musculoskeletal: Positive for joint pain and myalgias.  Skin: Negative for rash.  Neurological: Positive for speech change and focal weakness.  All other systems reviewed and are negative.      Past Medical History:  Diagnosis Date  . CAD (coronary artery disease)    S/P  anterior  wall myocardial infarction in '92, treated w/percutaneous transluminal coronary angioplasty of the left anterior descending. EF 45%  . Carotid artery disease ([place])   . DVT (deep venous thrombosis) ([place])    X1  . History of nuclear stress test    a. Myoview 6/17: EF 20-25%, mid anteroseptal, apical anterior, apical septal, apical inferior, apical lateral and apical scar, no ischemia, intermediate risk  . HTN (hypertension)   . Hyperlipidemia   . Hypothyroidism   . Ischemic cardiomyopathy    a. Echo 6/17: EF 20-25%, apex appears akinetic, MAC, moderate MR, moderate LAE, mild RVE, trivial PI, PASP 47 mmHg (needs repeat with Definity contrast) //  b. Limited echo with Definity contrast 7/17: EF 25-30%, moderate to severe LAE  . MI (myocardial infarction) ([place]) 1992   at age 33; TIA treated with coumadin until Plavix initiated in 2006  . PAD (peripheral artery disease) ([facility])    Right SFA occlusion, severe disease left CFA and SFA        Past Surgical History:  Procedure Laterality Date  .  APPENDECTOMY     at hysterectomy and USO for fibroids, Dr. Ysidro Evert  . CARDIAC CATHETERIZATION  1992   Dr Eustace Quail  . CARDIAC CATHETERIZATION N/A 09/06/2015   Procedure:  Right/Left Heart Cath and Coronary Angiography;  Surgeon: Burnell Blanks, MD;  Location: [facility];  Service: Cardiovascular;  Laterality: N/A;  . CARDIAC CATHETERIZATION N/A 09/07/2015   Procedure: Coronary Stent Intervention;  Surgeon: Burnell Blanks, MD;  Location: [facility];  Service: Cardiovascular;  Laterality: N/A;  . COLONOSCOPY     negative; 2008, Dr. Delfin Edis  . fracture LLE     '94; pinned  . IR ANGIO INTRA EXTRACRAN SEL COM CAROTID INNOMINATE BILAT MOD SED  03/02/2017  . IR ANGIO VERTEBRAL SEL VERTEBRAL UNI R MOD SED  03/02/2017  . RADIOLOGY WITH ANESTHESIA N/A 03/02/2017   Procedure: RADIOLOGY WITH ANESTHESIA;  Surgeon: Luanne Bras, MD;  Location: [facility];  Service: Radiology;  Laterality: N/A;  . TONSILLECTOMY    . TOTAL ABDOMINAL HYSTERECTOMY     & BSO for fibroids        Family History  Problem Relation Age of Onset  . Diabetes Mother   . Hypertension Mother   . Stroke Brother        ?> 55  . Coronary artery disease Brother        stent in 37s  . Cancer Neg Hx    Social History:  reports that she quit smoking about 27 years ago. she has never used smokeless tobacco. She reports that she does not drink alcohol or use drugs. Allergies:       Allergies  Allergen Reactions  . Definity [Perflutren Lipid Microsphere] Other (See Comments)    Patient experienced back pain with injection  . Cilostazol Other (See Comments)     Pletal :" head felt funny"         Medications Prior to Admission  Medication Sig Dispense Refill  . apixaban (ELIQUIS) 5 MG TABS tablet Take 1 tablet (5 mg total) by mouth 2 (two) times daily. 180 tablet 3  . atorvastatin (LIPITOR) 80 MG tablet Take 1 tablet (80 mg total) by mouth daily. 30 tablet 11  . benazepril (LOTENSIN) 20 MG tablet TAKE 1 TABLET EVERY DAY (Patient taking differently: TAKE 1 TABLET (47m) EVERY DAY) 90 tablet 1  . clopidogrel (PLAVIX) 75 MG tablet TAKE 1  TABLET EVERY DAY (Patient taking differently: TAKE 1 TABLET (760m EVERY DAY) 90 tablet 3  . levothyroxine (SYNTHROID, LEVOTHROID) 100 MCG tablet Take 100 mcg by mouth daily before breakfast.    . metoprolol succinate (TOPROL-XL) 50 MG 24 hr tablet Take 1 tablet (50 mg total) by mouth 2 (two) times daily. Take with or immediately following a meal. 180 tablet 3  . spironolactone (ALDACTONE) 25 MG tablet Take 1 tablet (25 mg total) by mouth daily. 30 tablet 7  . amLODipine (NORVASC) 2.5 MG tablet Take 1 tablet (2.5 mg total) by mouth daily. (Patient not taking: Reported on 03/03/2017) 90 tablet 3  . furosemide (LASIX) 40 MG tablet Take 1 tablet (40 mg total) by mouth daily. (Patient not taking: Reported on 03/03/2017) 90 tablet 3  . levothyroxine (SYNTHROID, LEVOTHROID) 112 MCG tablet Take 1 tablet (112 mcg total) by mouth daily. (Patient not taking: Reported on 03/03/2017) 90 tablet 1  . nitroGLYCERIN (NITROSTAT) 0.4 MG SL tablet Place 1 tablet (0.4 mg total) under the tongue every 5 (five) minutes as needed for chest pain. 25 tablet  1  . Potassium Chloride ER 20 MEQ TBCR Take 20 mEq by mouth daily. (Patient not taking: Reported on 03/03/2017) 30 tablet 1    Home: [place] expects to be discharged to:: Private residence Living Arrangements: Alone Available Help at Discharge: Family, Available PRN/intermittently Type of Home: House Home Access: Stairs to enter Technical brewer of Steps: 6 Entrance Stairs-Rails: Right Home Layout: One level Bathroom Shower/Tub: Chiropodist: Handicapped height Home Equipment: Environmental consultant - 2 wheels, [place] - single point  Functional History: Prior Function Level of Independence: Independent, Independent with assistive device(s) Comments: Pt reports that she was independent with ADLs, IADLs, and driving Functional Status:  Mobility: Bed Mobility Overal bed mobility: Needs Assistance Bed Mobility: Sit to  Supine Supine to sit: Min assist Sit to supine: Min assist General bed mobility comments: Min A to manage LEs Transfers Overall transfer level: Needs assistance Equipment used: Rolling walker (2 wheeled), None, 1 person hand held assist Transfers: Sit to/from Stand Sit to Stand: Min assist, Min guard General transfer comment: Min A for stability with RW.  Ambulation/Gait Ambulation/Gait assistance: Min assist, Min guard Ambulation Distance (Feet): 35 Feet Assistive device: Rolling walker (2 wheeled) Gait Pattern/deviations: Shuffle General Gait Details: Pt needed min hand held assist to walk around the room without RW, when we switched to RW she was min guard assist.  Of note, she kept running into obstacles on her right side.  I will ask OT to further assess vision during their assessment.  Gait velocity: decreased Gait velocity interpretation: Below normal speed for age/gender  ADL: ADL Overall ADL's : Needs assistance/impaired Eating/Feeding: Set up, Sitting, Supervision/ safety Grooming: Min guard, Oral care, Standing, Cueing for sequencing Grooming Details (indicate cue type and reason): Pt performing oral care at sink with Min Guard for safety. Pt demosntrating poor functional performance with decreased balance, problem solving, awareness, FM skills, motor planning, and grasp strength. Pt first attempting to open tooth paste at wrong end and required significant amount of time to realize she needed to twist the cap side. Pt presenting with poor motor planning and targeted reaching as seen when reaching toward faucet and missing. Pt seems to perseverate when brushing teeth and would repeat steps several times including filling her cup with water, sipping, then pouring it out and repeating several more time. Pt also loosing her balance once during grooming at sink and reached for sink to stabilize herself.  Upper Body Bathing: Minimal assistance, Sitting Lower Body Bathing: Minimal  assistance, Sit to/from stand Upper Body Dressing : Minimal assistance, Sitting Lower Body Dressing: Minimal assistance, Sit to/from stand Lower Body Dressing Details (indicate cue type and reason): Pt able to reach down for socks to pull them up. demonstrating poor FM skills during task and required increased time Toilet Transfer: Minimal assistance, Ambulation, RW Toilet Transfer Details (indicate cue type and reason): Simulated in room Functional mobility during ADLs: Min guard, Rolling walker(poor awarness and bumping into object on R) General ADL Comments: Pt demonstrating decreased functional performance compare to baseline. Pt presenting with decreased balance, vision, cognition, FM skills, and motor planning.  Cognition: Cognition Overall Cognitive Status: Impaired/Different from baseline Arousal/Alertness: Awake/alert Orientation Level: Oriented X4 Attention: Selective Selective Attention: Appears intact Problem Solving: Impaired Problem Solving Impairment: Functional basic Cognition Arousal/Alertness: Awake/alert Behavior During Therapy: WFL for tasks assessed/performed(Pt occasionally laughing when not as appropriate) Overall Cognitive Status: Impaired/Different from baseline Area of Impairment: Attention, Following commands, Safety/judgement, Awareness, Problem solving Current Attention Level: Sustained Following Commands:  Follows one step commands with increased time, Follows multi-step commands with increased time, Follows multi-step commands inconsistently Safety/Judgement: Decreased awareness of safety, Decreased awareness of deficits Awareness: Emergent Problem Solving: Slow processing, Requires verbal cues, Requires tactile cues General Comments: Pt with decreased cognition as seen by poor problem solving, attention, and awareness. Pt demonstrating decreased problem solving as seen during grooming at sink. Pt attempted to open tooth paste by twisting the bottom of the  tooth paste instead of the cap - required increased time to fingure out she was turning the wrong side, then pt awkwardly turn the cap without repositioning her hands. Pt would bumpRW into objects on R side and had difficulty problem solving to move RW  Blood pressure (!) 159/60, pulse 64, temperature 98.4 F (36.9 C), temperature source Oral, resp. rate (!) 0, height _0  (1.676 m), weight 100.7 kg (222 lb 0.1 oz), SpO2 99 %. Physical Exam  Vitals reviewed. Constitutional: She appears well-developed and well-nourished.  HENT:  Head: Normocephalic and atraumatic.  Eyes: EOM are normal. Right eye exhibits no discharge. Left eye exhibits no discharge.  Neck: Normal range of motion. Neck supple. No thyromegaly present.  Cardiovascular:  Irregularly irregular  Respiratory: Effort normal and breath sounds normal. No respiratory distress.  GI: Soft. Bowel sounds are normal. She exhibits no distension.  Musculoskeletal: She exhibits no edema or tenderness.  Neurological: She is alert.  Patient makes good eye contact with examiner and follows full verbal commands.  She was able to slowly state her name and age as well as date of birth.  Motor: LUE: 5/5 proximal to distal  RUE: 4+/5 proximal to distal LLE: HE, KE 2/5 ADF/PF 4-/5 RLE: HE, KE 4-/5, ADF/PF 4/5  Skin: Skin is warm and dry.  Psychiatric: She has a normal mood and affect. Her behavior is normal.            Assessment/Plan: Diagnosis: Left parietal MCA territory infarct Labs and images independently reviewed.  Records reviewed and summated above. Stroke: Continue secondary stroke prophylaxis and Risk Factor Modification listed below:   Antiplatelet therapy:   Blood Pressure Management:  Continue current medication with prn's with permisive HTN per primary team Statin Agent:   Right sided hemiparesis  Motor recovery: Fluoxetine  1. Does the need for close, 24 hr/day medical supervision in concert with the patient's rehab  needs make it unreasonable for this patient to be served in a less intensive setting? Yes  2. Co-Morbidities requiring supervision/potential complications: CAD/MI ischemic cardiomyopathy (cont meds), medical compliance (counsel), CKD (avoid nephrotoxic meds) 3. Due to bladder management, disease management and patient education, does the patient require 24 hr/day rehab nursing? Yes 4. Does the patient require coordinated care of a physician, rehab nurse, PT (1-2 hrs/day, 5 days/week) and OT (1-2 hrs/day, 5 days/week) to address physical and functional deficits in the context of the above medical diagnosis(es)? Yes Addressing deficits in the following areas: balance, endurance, locomotion, strength, transferring, bathing, dressing, toileting and psychosocial support 5. Can the patient actively participate in an intensive therapy program of at least 3 hrs of therapy per day at least 5 days per week? Yes 6. The potential for patient to make measurable gains while on inpatient rehab is good 7. Anticipated functional outcomes upon discharge from inpatient rehab are modified independent and supervision  with PT, modified independent and supervision with OT, n/a with SLP. 8. Estimated rehab length of stay to reach the above functional goals is: 4-7 days.  9. Anticipated D/C  setting: Home 10. Anticipated post D/C treatments: HH therapy and Home excercise program 11. Overall Rehab/Functional Prognosis: good  RECOMMENDATIONS: This patient's condition is appropriate for continued rehabilitative care in the following setting: CIR Patient has agreed to participate in recommended program. Yes Note that insurance prior authorization may be required for reimbursement for recommended care.  Comment: Rehab Admissions Coordinator to follow up.  Delice Lesch, MD, ABPMR Lavon Paganini Angiulli, PA-C 03/05/2017          Revision History                        Routing History

## 2017-03-07 NOTE — Progress Notes (Signed)
Patient information reviewed and entered into eRehab system by Sherran Margolis, RN, CRRN, PPS Coordinator.  Information including medical coding and functional independence measure will be reviewed and updated through discharge.     Per nursing patient was given "Data Collection Information Summary for Patients in Inpatient Rehabilitation Facilities with attached "Privacy Act Statement-Health Care Records" upon admission.  

## 2017-03-07 NOTE — Evaluation (Signed)
Occupational Therapy Assessment and Plan  Patient Details  Name: Yvonne Bailey MRN: [id number] Date of Birth: 1941-01-13  OT Diagnosis: apraxia, disturbance of vision, hemiplegia affecting dominant side and muscle weakness (generalized) Rehab Potential: Rehab Potential (ACUTE ONLY): Good ELOS: ~10-12 days   Today's Date: 03/07/2017 OT Individual Time: 1000-1100 OT Individual Time Calculation (min): 60 min     Problem List:  Patient Active Problem List   Diagnosis Date Noted  . Global aphasia   . Benign essential HTN   . Acute ischemic cerebrovascular accident (CVA) involving left middle cerebral artery territory [facility])   . Acute ischemic left MCA stroke ([place]) 03/06/2017  . Expressive aphasia   . Coronary artery disease involving native coronary artery without angina pectoris   . Stage 3 chronic kidney disease ([place])   . Medically noncompliant   . Stroke (cerebrum) ([place]) 03/02/2017  . Prediabetes 07/28/2016  . DOE (dyspnea on exertion) 07/09/2016  . Gait instability 07/09/2016  . Itching 04/16/2016  . Unstable angina ([place]) 09/06/2015  . Cardiomyopathy, ischemic   . Chronic systolic heart failure ([place]) 08/21/2015  . Bilateral leg edema 07/19/2015  . Atrial fibrillation ([place]) 07/19/2015  . Urinary urgency 03/30/2015  . Other musculoskeletal symptoms referable to limbs(729.89) 12/17/2012  . Lacunar infarction 12/17/2012  . Small vessel disease, cerebrovascular 12/17/2012  . CAROTID BRUIT, RIGHT 08/10/2008  . Peripheral vascular disease ([place]) 06/04/2007  . Diverticulosis of large intestine 06/04/2007  . Hypothyroidism 02/24/2007  . Hyperlipidemia 02/24/2007  . Essential hypertension 02/24/2007  . DVT 01/21/2007  . Coronary atherosclerosis 10/29/2006    Past Medical History:  Past Medical History:  Diagnosis Date  . CAD (coronary artery disease)    S/P  anterior  wall myocardial infarction in '92, treated w/percutaneous transluminal coronary angioplasty of the left  anterior descending. EF 45%  . Carotid artery disease ([place])   . DVT (deep venous thrombosis) ([place])    X1  . History of nuclear stress test    a. Myoview 6/17: EF 20-25%, mid anteroseptal, apical anterior, apical septal, apical inferior, apical lateral and apical scar, no ischemia, intermediate risk  . HTN (hypertension)   . Hyperlipidemia   . Hypothyroidism   . Ischemic cardiomyopathy    a. Echo 6/17: EF 20-25%, apex appears akinetic, MAC, moderate MR, moderate LAE, mild RVE, trivial PI, PASP 47 mmHg (needs repeat with Definity contrast) //  b. Limited echo with Definity contrast 7/17: EF 25-30%, moderate to severe LAE  . MI (myocardial infarction) ([place]) 1992   at age 47; TIA treated with coumadin until Plavix initiated in 2006  . PAD (peripheral artery disease) ([facility])    Right SFA occlusion, severe disease left CFA and SFA   Past Surgical History:  Past Surgical History:  Procedure Laterality Date  . APPENDECTOMY     at hysterectomy and USO for fibroids, Dr. Ysidro Evert  . CARDIAC CATHETERIZATION  1992   Dr Eustace Quail  . CARDIAC CATHETERIZATION N/A 09/06/2015   Procedure: Right/Left Heart Cath and Coronary Angiography;  Surgeon: Burnell Blanks, MD;  Location: [facility];  Service: Cardiovascular;  Laterality: N/A;  . CARDIAC CATHETERIZATION N/A 09/07/2015   Procedure: Coronary Stent Intervention;  Surgeon: Burnell Blanks, MD;  Location: [place];  Service: Cardiovascular;  Laterality: N/A;  . COLONOSCOPY     negative; 2008, Dr. Delfin Edis  . fracture LLE     '94; pinned  . IR ANGIO INTRA EXTRACRAN SEL COM CAROTID INNOMINATE BILAT MOD SED  03/02/2017  . IR ANGIO VERTEBRAL SEL VERTEBRAL UNI R MOD SED  03/02/2017  . RADIOLOGY WITH ANESTHESIA N/A 03/02/2017   Procedure: RADIOLOGY WITH ANESTHESIA;  Surgeon: Luanne Bras, MD;  Location: [facility];  Service: Radiology;  Laterality: N/A;  . TONSILLECTOMY    . TOTAL ABDOMINAL HYSTERECTOMY     & BSO for  fibroids    Assessment & Plan Clinical Impression: Patient is a 76 y.o. year old female  with history ofICM/CAD, HTN, DVT-on Eliquisand questionofmedical compliance. She was admitted on12/23/2018 with acute onset of expressive aphasiawith question of confusion. CTA head showed low flow enhancement of proximal M2 brach suspicious for occlusion, occluded L-VA, moderate carotid siphon atherosclerosis with moderate multifocal stenosis. CT perfusion with left frontal cortex and subcortical white matter infarct. 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 2D echo with no false tendon in mid LV and LVF appears reduced with poor acoustical windows. 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 Dr. Leonie Man felt that [facility] branch infarct embolic due to A fib and question of compliance--patient to continue Eliquis per discussion with family. BLE dopplers without DVT.  Patient transferred to CIR on 03/06/2017 .    Patient currently requires mod to max A  with basic self-care skills and min to mod A for basic mobility  secondary to muscle weakness, decreased cardiorespiratoy endurance, impaired timing and sequencing, unbalanced muscle activation, motor apraxia, decreased coordination and decreased motor planning, decreased visual perceptual skills, decreased attention to right and decreased motor planning, decreased awareness, decreased problem solving, decreased safety awareness, decreased memory and delayed processing and decreased sitting balance, decreased standing balance, decreased postural control, hemiplegia and decreased balance strategies.   Prior to hospitalization, patient could complete ADL with modified independent .  Patient will benefit from skilled intervention to decrease level of assist with basic self-care skills and increase independence with basic self-care skills prior to discharge home with care partner.  Anticipate patient will require 24 hour supervision and follow up home health.  OT - End of Session Activity Tolerance: Tolerates 30+ min activity with multiple rests Endurance Deficit: Yes Endurance Deficit Description: labored breathing throughout session  OT Assessment Rehab Potential (ACUTE ONLY): Good OT Patient demonstrates impairments in the following area(s): Balance;Perception;Behavior;Safety;Cognition;Sensory;Skin Integrity;Endurance;Motor;Vision;Pain OT Basic ADL's Functional Problem(s): Grooming;Bathing;Dressing;Toileting OT Transfers Functional Problem(s): Toilet;Tub/Shower OT Additional Impairment(s): Fuctional Use of Upper Extremity OT Plan OT Intensity: Minimum of 1-2 x/day, 45 to 90 minutes OT Frequency: 5 out of 7 days OT Duration/Estimated Length of Stay: ~10-12 days OT Treatment/Interventions: Balance/vestibular training;Discharge planning;Functional electrical stimulation;Pain management;Self Care/advanced ADL retraining;Therapeutic Activities;UE/LE Coordination activities;Cognitive remediation/compensation;Disease mangement/prevention;Functional mobility training;Patient/family education;Skin care/wound managment;Therapeutic Exercise;Visual/perceptual remediation/compensation;Community reintegration;DME/adaptive equipment instruction;Neuromuscular re-education;Psychosocial support;UE/LE Strength taining/ROM OT Self Feeding Anticipated Outcome(s): n/a OT Basic Self-Care Anticipated Outcome(s): supervision OT Toileting Anticipated Outcome(s): supervision OT Bathroom Transfers Anticipated Outcome(s): supervision OT Recommendation Recommendations for Other Services: Neuropsych consult Patient  destination: Home Follow Up Recommendations: Home health OT Equipment Recommended: To be determined   Skilled Therapeutic Intervention Ot eval initiated with OT goals, purpose and role discussed with pt. Self care retraining at shower level. Focus on functional ambulation without AD with min to mod A with more than  reasonable amt of time. Pt required mod cues throughout session for attention to right and for proper use of tools and clothing with right hand due to apraxia. Pt with difficulty threading pants due to difficulty lifting legs to place inside pant leg. Pt was incontinent  once in session, requiring total A to clean up. Pt with difficulty with orientation of toothbrush when brushing teeth requiring mod A to orient brush often. Left resting in recliner at end of session.   OT Evaluation Precautions/Restrictions  Precautions Precautions: Fall Precaution Comments: mild R inattention Restrictions Weight Bearing Restrictions: No General Chart Reviewed: Yes Family/Caregiver Present: No   Pain  no c/o pain  Home Living/Prior Functioning Home Living Family/patient expects to be discharged to:: Private residence Living Arrangements: Alone Available Help at Discharge: Family, Available PRN/intermittently Type of Home: House Home Access: Stairs to enter Technical brewer of Steps: 6 Entrance Stairs-Rails: Right Home Layout: One level Bathroom Shower/Tub: Chiropodist: Handicapped height  Lives With: Alone Prior Function Level of Independence: Requires assistive device for independence  Able to Take Stairs?: Yes Driving: Yes Vocation: Retired Comments: uses cane for community access, still drives. grand daughter helps with ADLs,  ADL ADL ADL Comments: see functional navigator Vision Baseline Vision/History: Wears glasses;Glaucoma Wears Glasses: At all times Patient Visual Report: No change from baseline Eye Alignment: Within Functional  Limits Ocular Range of Motion: Within Functional Limits Alignment/Gaze Preference: Gaze left Perception  Perception: Impaired Inattention/Neglect: Does not attend to right visual field;Does not attend to right side of body Praxis Praxis: Impaired Praxis Impairment Details: Initiation;Motor planning Cognition Overall Cognitive Status: Impaired/Different from baseline Arousal/Alertness: Awake/alert Orientation Level: Person;Place;Situation Person: Oriented Place: Oriented Situation: Oriented Year: 2018 Month: December Day of Week: Correct Memory: Appears intact Immediate Memory Recall: Sock;Bed;Blue Memory Recall: Sock;Blue;Bed Memory Recall Sock: Without Cue Memory Recall Blue: Without Cue Attention: Sustained Sustained Attention: Appears intact Awareness: Impaired Awareness Impairment: Emergent impairment Problem Solving: Impaired Problem Solving Impairment: Functional basic Safety/Judgment: Impaired Comments: decreased awareness of physical and visual deficits.  Sensation Sensation Light Touch: Impaired Detail Light Touch Impaired Details: Impaired RUE;Impaired RLE Stereognosis: Appears Intact Hot/Cold: Appears Intact Proprioception: Impaired Detail Proprioception Impaired Details: Impaired RLE;Impaired RUE Coordination Gross Motor Movements are Fluid and Coordinated: No Fine Motor Movements are Fluid and Coordinated: No Coordination and Movement Description: dysmetric movement with poor initiation on the R  Finger Nose Finger Test: decreased speed bilaterally. decreased accuracy on the R from apparent visual and apaxic deficits.  Motor  Motor Motor: Hemiplegia;Motor apraxia;Motor impersistence Motor - Skilled Clinical Observations: Pt demonstrateds weakness in the LLE. poor initiation of movements in the RUE  Mobility  Transfers Transfers: Sit to Stand;Stand to Sit Sit to Stand: 4: Min assist  Trunk/Postural Assessment  Cervical Assessment Cervical Assessment:  Within Functional Limits Thoracic Assessment Thoracic Assessment: Within Functional Limits Lumbar Assessment Lumbar Assessment: Within Functional Limits Postural Control Postural Control: Deficits on evaluation Trunk Control: in standing requires Ue support  Protective Responses: delayed  Balance Static Sitting Balance Static Sitting - Level of Assistance: 5: Stand by assistance Dynamic Sitting Balance Dynamic Sitting - Level of Assistance: 4: Min assist Static Standing Balance Static Standing - Level of Assistance: 4: Min assist Dynamic Standing Balance Dynamic Standing - Level of Assistance: 3: Mod assist;4: Min assist Extremity/Trunk Assessment RUE Assessment RUE Assessment: Exceptions to [facility] RUE AROM (degrees) RUE Overall AROM Comments: WFL RUE Strength RUE Overall Strength Comments: Brunstrom VII impacted by decr sensation and apraxia LUE Assessment LUE Assessment:  Within Functional Limits   See Function Navigator for Current Functional Status.   Refer to Care Plan for Long Term Goals  Recommendations for other services: None    Discharge Criteria: Patient will be discharged from OT if patient refuses treatment 3 consecutive times without medical reason, if treatment goals not met, if there is a change in medical status, if patient makes no progress towards goals or if patient is discharged from hospital.  The above assessment, treatment plan, treatment alternatives and goals were discussed and mutually agreed upon: by patient  Nicoletta Ba 03/07/2017, 2:34 PM

## 2017-03-07 NOTE — Progress Notes (Signed)
Retta Diones, RN  Rehab Admission Coordinator  Physical Medicine and Rehabilitation  PMR Pre-admission  Signed  Date of Service:  03/06/2017 4:04 PM       Related encounter: ED to Hosp-Admission (Discharged) from 03/02/2017 in [facility]      Signed            _0 Hide copied text  _1 Hover for details   PMR Admission Coordinator Pre-Admission Assessment  Patient: Yvonne Bailey is an 76 y.o., female MRN: [id number] DOB: 1940-06-19 Height: _2  (167.6 cm) Weight: 100.7 kg (222 lb 0.1 oz)                                                                                                              Insurance Information HMO: Yes    PPO:       PCP:       IPA:       80/20:       OTHER:    PRIMARY: [employer]      Policy#: [id number]      Subscriber: patient CM Name:  Johnney Ou      Phone#: [phone]     Fax#: [phone] Pre-Cert#: [id number]      Employer: Retired Benefits:  Phone #: [phone]     Name:  [name]. Date: 03/11/16     Deduct:  $0      Out of Pocket Max: $5900 (met $400)      Life Max: N/A CIR: $295 days 1-6      SNF: $0 days 1-20; $167.00 days 21-100 Outpatient:  Medical necessity     Co-Pay: $40/visit Home Health: 100%      Co-Pay: none DME: 80%     Co-Pay: 20% Providers: in network  Emergency [place]    Name Relation Home Work Mobile   Rogers,[name] Son   [place], [place]   [phone]     Current Medical History  Patient Admitting Diagnosis: Left parietal MCA territory infarct    History of Present Illness: A 76 y.o.RHfemalewith history ofICM/CAD, HTN, DVT-on Eliquisand questionofmedical compliance. She was admitted on12/23/2018 with acute onset of expressive aphasiawith question of confusion. CTA head showed low flow enhancement of proximal M2 brach suspicious for occlusion, occluded L-VA, moderate carotid siphon  atherosclerosis with moderate multifocal stenosis. CT perfusion with left frontal cortex and subcortical white matter infarct. 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 2D echo with no false tendon in mid LV and LVF appears reduced with poor acoustical windows. 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 Dr. Leonie Man felt that [facility] branch infarct  embolic due to A fib and question of compliance--patient to continue Eliquis per discussion with family. BLE dopplers without DVT.   Patient with expressive > receptive aphasia with mild deficits in speech as well as balance deficits. CIR recommended due to decline in functional status.  Total: 1=NIH  Past Medical History      Past Medical History:  Diagnosis Date  . CAD (coronary artery disease)    S/P  anterior  wall myocardial infarction in '92, treated w/percutaneous transluminal coronary angioplasty of the left anterior descending. EF 45%  . Carotid artery disease ([place])   . DVT (deep venous thrombosis) ([place])    X1  . History of nuclear stress test    a. Myoview 6/17: EF 20-25%, mid anteroseptal, apical anterior, apical septal, apical inferior, apical lateral and apical scar, no ischemia, intermediate risk  . HTN (hypertension)   . Hyperlipidemia   . Hypothyroidism   . Ischemic cardiomyopathy    a. Echo 6/17: EF 20-25%, apex appears akinetic, MAC, moderate MR, moderate LAE, mild RVE, trivial PI, PASP 47 mmHg (needs repeat with Definity contrast) //  b. Limited echo with Definity contrast 7/17: EF 25-30%, moderate to severe LAE  . MI (myocardial infarction) ([place])  1992   at age 89; TIA treated with coumadin until Plavix initiated in 2006  . PAD (peripheral artery disease) ([facility])    Right SFA occlusion, severe disease left CFA and SFA    Family History  family history includes Coronary artery disease in her brother; Diabetes in her mother; Hypertension in her mother; Stroke in her brother.  Prior Rehab/Hospitalizations: No rehab admissions  Has the patient had major surgery during 100 days prior to admission? No  Current Medications   Current Facility-Administered Medications:  .  [DISCONTINUED] acetaminophen (TYLENOL) tablet 650 mg, 650 mg, Oral, Q4H PRN **OR** acetaminophen (TYLENOL) solution 650 mg, 650 mg, Per Tube, Q4H PRN **OR** [DISCONTINUED] acetaminophen (TYLENOL) suppository 650 mg, 650 mg, Rectal, Q4H PRN, Aroor, Karena Addison R, MD .  apixaban (ELIQUIS) tablet 5 mg, 5 mg, Oral, Q12H, [place], Donalynn Furlong, RPH, 5 mg at 03/06/17 0530 .  atorvastatin (LIPITOR) tablet 80 mg, 80 mg, Oral, Daily, Costello, Mary A, NP .  benazepril (LOTENSIN) tablet 20 mg, 20 mg, Oral, Daily, Costello, Mary A, NP .  Derrill Memo ON 03/07/2017] levothyroxine (SYNTHROID, LEVOTHROID) tablet 100 mcg, 100 mcg, Oral, QAC breakfast, Costello, Mary A, NP .  lidocaine (PF) (XYLOCAINE) 1 % injection, , , PRN, Deveshwar, Sanjeev, MD, 10 mL at 03/02/17 2211 .  metoprolol succinate (TOPROL-XL) 24 hr tablet 50 mg, 50 mg, Oral, BID, Costello, Mary A, NP .  spironolactone (ALDACTONE) tablet 25 mg, 25 mg, Oral, Daily, Costello, Mary A, NP  Patients Current Diet: Diet regular Room service appropriate? Yes; Fluid consistency: Thin  Precautions / Restrictions Precautions Precautions: Fall Precaution Comments: mildly unsteady on her feet.  Restrictions Weight Bearing Restrictions: No   Has the patient had 2 or more falls or a fall with injury in the past year?No  Prior Activity Level Limited Community (1-2x/wk): Went out 3 X a week, was driving.  Home Assistive Devices /  Equipment Home Equipment: Environmental consultant - 2 wheels, Cane - single point  Prior Device Use: Indicate devices/aids used by the patient prior to current illness, exacerbation or injury? Cane  Prior Functional Level Prior Function Level of Independence: Independent, Independent with assistive device(s) Comments: uses cane for community access, still drives,   Self Care: Did the patient need help bathing, dressing, using  the toilet or eating?  Independent  Indoor Mobility: Did the patient need assistance with walking from room to room (with or without device)? Independent  Stairs: Did the patient need assistance with internal or external stairs (with or without device)? Independent  Functional Cognition: Did the patient need help planning regular tasks such as shopping or remembering to take medications? Independent  Current Functional Level Cognition  Arousal/Alertness: Awake/alert Overall Cognitive Status: Impaired/Different from baseline Current Attention Level: Selective Orientation Level: Oriented X4 Following Commands: Follows multi-step commands inconsistently, Follows multi-step commands with increased time Safety/Judgement: Decreased awareness of safety, Decreased awareness of deficits General Comments: Pt with decreased cognition as seen by poor problem solving, attention, and awareness. Pt demonstrating decreased problem solving as seen during grooming at sink. Pt attempted to open tooth paste by twisting the bottom of the tooth paste instead of the cap - required increased time to fingure out she was turning the wrong side, then pt awkwardly turn the cap without repositioning her hands. Pt would bumpRW into objects on R side and had difficulty problem solving to move RW Attention: Selective Selective Attention: Appears intact Problem Solving: Impaired Problem Solving Impairment: Functional basic    Extremity Assessment (includes Sensation/Coordination)  Upper Extremity  Assessment: RUE deficits/detail RUE Deficits / Details: RUE weakness with poor grasp strength. Pt unable to perform finger opposition due to poor motor planning. Pt also demonstrating poor motor planning during finger-to-nose test and was unable to perform targeted teaching; also seen at sink during grooming with poor reach toward faucet. RUE Coordination: decreased fine motor  Lower Extremity Assessment: Defer to PT evaluation    ADLs  Overall ADL's : Needs assistance/impaired Eating/Feeding: Set up, Sitting, Supervision/ safety Grooming: Min guard, Oral care, Standing, Cueing for sequencing Grooming Details (indicate cue type and reason): Pt performing oral care at sink with Min Guard for safety. Pt demosntrating poor functional performance with decreased balance, problem solving, awareness, FM skills, motor planning, and grasp strength. Pt first attempting to open tooth paste at wrong end and required significant amount of time to realize she needed to twist the cap side. Pt presenting with poor motor planning and targeted reaching as seen when reaching toward faucet and missing. Pt seems to perseverate when brushing teeth and would repeat steps several times including filling her cup with water, sipping, then pouring it out and repeating several more time. Pt also loosing her balance once during grooming at sink and reached for sink to stabilize herself.  Upper Body Bathing: Minimal assistance, Sitting Lower Body Bathing: Minimal assistance, Sit to/from stand Upper Body Dressing : Minimal assistance, Sitting Lower Body Dressing: Minimal assistance, Sit to/from stand Lower Body Dressing Details (indicate cue type and reason): Pt able to reach down for socks to pull them up. demonstrating poor FM skills during task and required increased time Toilet Transfer: Minimal assistance, Ambulation, RW Toilet Transfer Details (indicate cue type and reason): Simulated in room Functional mobility during  ADLs: Min guard, Rolling walker(poor awarness and bumping into object on R) General ADL Comments: Pt demonstrating decreased functional performance compare to baseline. Pt presenting with decreased balance, vision, cognition, FM skills, and motor planning.    Mobility  Overal bed mobility: Needs Assistance Bed Mobility: Supine to Sit Supine to sit: Min assist Sit to supine: Min assist General bed mobility comments: minA for LE management to floor and back into bed    Transfers  Overall transfer level: Needs assistance Equipment used: Rolling walker (2 wheeled), None, 1 person  hand held assist Transfers: Sit to/from Stand Sit to Stand: Min assist, Min guard General transfer comment: minA for sit<>stand from bed, min guard from toilet    Ambulation / Gait / Stairs / Wheelchair Mobility  Ambulation/Gait Ambulation/Gait assistance: Museum/gallery curator (Feet): 200 Feet Assistive device: Rolling walker (2 wheeled) Gait Pattern/deviations: Shuffle General Gait Details: minA for steadying with RW, vc for navigation around obstacles on R.  Gait velocity: decreased Gait velocity interpretation: Below normal speed for age/gender    Posture / Balance Balance Overall balance assessment: Needs assistance Sitting-balance support: No upper extremity supported, Feet supported Sitting balance-Leahy Scale: Good Standing balance support: No upper extremity supported, Single extremity supported, During functional activity Standing balance-Leahy Scale: Fair    Special needs/care consideration BiPAP/CPAP NO CPM No Continuous Drip IV No  Dialysis No        Life Vest No Oxygen No Special Bed No Trach Size No Wound Vac (area) No      Skin No                             Bowel mgmt: WDL, but not recent documented BM Bladder mgmt: Voiding up on BSC Diabetic mgmt No    Previous Home Environment Living Arrangements: Alone Available Help at Discharge: Family, Available  PRN/intermittently Type of Home: House Home Layout: One level Home Access: Stairs to enter Entrance Stairs-Rails: Right Entrance Stairs-Number of Steps: 6 Bathroom Shower/Tub: Chiropodist: Handicapped height  Discharge Living Setting Plans for Discharge Living Setting: Patient's home, Alone, House(Lives alone.) Type of Home at Discharge: House Discharge Home Layout: One level Discharge Home Access: Stairs to enter Entrance Stairs-Number of Steps: 5-6 steps  Social/Family/Support Systems Patient Roles: Parent, Other (Comment)(Has a son and a grand daughter.) Contact Information: See emergency contacts Anticipated Caregiver: Roxy Cedar - granddaughter Anticipated Caregiver's Contact Information: Delana Meyer [phone] Ability/Limitations of Caregiver: Rozanna Boer daughter is not working and can assist.  She may stay at patient's home Caregiver Availability: Other (Comment)(May have 24/7 coverage initially through grand daughter) Discharge Plan Discussed with Primary Caregiver: Yes Is Caregiver In Agreement with Plan?: Yes Does Caregiver/Family have Issues with Lodging/Transportation while Pt is in Rehab?: No  Goals/Additional Needs Patient/Family Goal for Rehab: PT/OT mod I and supervision goals Expected length of stay: 4-7 days Cultural Considerations: None Dietary Needs: Regular diet, thin liquids Equipment Needs: TBD Pt/Family Agrees to Admission and willing to participate: Yes Program Orientation Provided & Reviewed with Pt/Caregiver Including Roles  & Responsibilities: Yes  Decrease burden of Care through IP rehab admission: N/A  Possible need for SNF placement upon discharge: Not anticipated  Patient Condition: This patient's condition remains as documented in the consult dated 03/05/17, in which the Rehabilitation Physician determined and documented that the patient's condition is appropriate for intensive rehabilitative care in an inpatient  rehabilitation facility. Will admit to inpatient rehab today.  Preadmission Screen Completed By:  Retta Diones, 03/06/2017 4:19 PM ______________________________________________________________________   Discussed status with Dr. Letta Pate on 03/06/17 at 1619 and received telephone approval for admission today.  Admission Coordinator:  Retta Diones, time 1622/Date 03/06/18              Cosigned by: Charlett Blake, MD at 03/06/2017 4:31 PM  Revision History

## 2017-03-07 NOTE — H&P (Signed)
Physical Medicine and Rehabilitation Admission H&P    Chief Complaint  Patient presents with  . Altered Mental Status    HPI: Yvonne Bailey is a 76 y.o. RH female with history of ICM/CAD, HTN, DVT-on Eliquis and question of medical compliance. She was admitted on 03/02/2017 with acute onset of expressive aphasia with question of confusion. CTA head showed low flow enhancement of proximal M2 brach suspicious for occlusion, occluded L-VA, moderate carotid siphon atherosclerosis with moderate multifocal stenosis. CT perfusion with left frontal cortex and subcortical white matter infarct.  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  2D echo with poor acoustical windows. 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 Dr. Leonie Man felt that [name] branch infarct embolic due to A fib and question of compliance--patient to continue Eliquis per discussion with family. BLE dopplers without DVT.    Patient with expressive > receptive aphasia with mild deficits in speech as well as balance deficits. CIR recommended due to decline in functional status.    Review of Systems  Constitutional: Negative for fever.  HENT: Negative for hearing loss and tinnitus.   Eyes: Negative for blurred vision and double vision.  Respiratory: Negative for cough.   Cardiovascular: Negative for chest pain and palpitations.  Gastrointestinal: Positive for constipation. Negative for abdominal pain, heartburn and nausea.  Genitourinary: Negative for dysuria and urgency.  Musculoskeletal: Positive for back pain and myalgias.  Skin: Negative for itching and rash.    Neurological: Positive for speech change and focal weakness. Negative for dizziness, sensory change, weakness and headaches.  Psychiatric/Behavioral: Negative for depression. The patient does not have insomnia.   All other systems reviewed and are negative.   Past Medical History:  Diagnosis Date  . CAD (coronary artery disease)    S/P  anterior  wall myocardial infarction in '92, treated w/percutaneous transluminal coronary angioplasty of the left anterior descending. EF 45%  . Carotid artery disease ([place])   . DVT (deep venous thrombosis) ([place])    X1  . History of nuclear stress test    a. Myoview 6/17: EF 20-25%, mid anteroseptal, apical anterior, apical septal, apical inferior, apical lateral and apical scar, no ischemia, intermediate risk  . HTN (hypertension)   . Hyperlipidemia   . Hypothyroidism   . Ischemic cardiomyopathy    a. Echo 6/17: EF 20-25%, apex appears akinetic, MAC, moderate MR, moderate LAE, mild RVE, trivial PI, PASP 47 mmHg (needs repeat with Definity contrast) //  b. Limited echo with Definity contrast 7/17: EF 25-30%, moderate to severe LAE  . MI (myocardial infarction) ([place]) 1992   at age 33; TIA treated with coumadin until Plavix initiated in 2006  . PAD (peripheral artery disease) ([facility])    Right SFA occlusion, severe disease left CFA and SFA    Past Surgical History:  Procedure Laterality Date  . APPENDECTOMY     at hysterectomy and USO for fibroids, Dr. Ysidro Evert  . CARDIAC CATHETERIZATION  1992   Dr Eustace Quail  . CARDIAC CATHETERIZATION N/A 09/06/2015   Procedure: Right/Left Heart Cath and Coronary Angiography;  Surgeon: Burnell Blanks, MD;  Location: [facility];  Service: Cardiovascular;  Laterality: N/A;  . CARDIAC CATHETERIZATION N/A 09/07/2015   Procedure: Coronary Stent Intervention;  Surgeon: Burnell Blanks, MD;  Location: [facility];  Service: Cardiovascular;  Laterality: N/A;  . COLONOSCOPY     negative; 2008, Dr.  Delfin Edis  . fracture LLE     '94; pinned  . IR ANGIO INTRA EXTRACRAN SEL COM CAROTID INNOMINATE BILAT MOD SED  03/02/2017  . IR ANGIO VERTEBRAL SEL VERTEBRAL UNI R MOD SED  03/02/2017  . RADIOLOGY WITH ANESTHESIA N/A 03/02/2017   Procedure: RADIOLOGY WITH ANESTHESIA;  Surgeon: Luanne Bras, MD;  Location: [facility];  Service: Radiology;  Laterality: N/A;  . TONSILLECTOMY    . TOTAL ABDOMINAL HYSTERECTOMY     & BSO for fibroids   Family History  Problem Relation Age of Onset  . Diabetes Mother   . Hypertension Mother   . Stroke Brother        ?> 55  . Coronary artery disease Brother        stent in 85s  . Cancer Neg Hx     Social History:  Lives alone. Independent with cane PTA.  Her granddaughters helps with home management.  She reports that she quit smoking about 27 years ago. she has never used smokeless tobacco. She reports that she does not drink alcohol or use drugs.    Allergies  Allergen Reactions  . Definity [Perflutren Lipid Microsphere] Other (See Comments)    Patient experienced back pain with injection  . Cilostazol Other (See Comments)     Pletal :" head felt funny"   Medications Prior to Admission  Medication Sig Dispense Refill  . apixaban (ELIQUIS) 5 MG TABS tablet Take 1 tablet (5 mg total) by mouth 2 (two) times daily. 180 tablet 3  . atorvastatin (LIPITOR) 80 MG tablet Take 1 tablet (80 mg total) by mouth daily. 30 tablet 11  . benazepril (LOTENSIN) 20 MG tablet TAKE 1 TABLET EVERY DAY (Patient taking differently: TAKE 1 TABLET (30m) EVERY DAY) 90 tablet 1  . clopidogrel (PLAVIX) 75 MG tablet TAKE 1 TABLET EVERY DAY (Patient taking differently: TAKE 1 TABLET (724m EVERY DAY) 90 tablet 3  . levothyroxine (SYNTHROID, LEVOTHROID) 100 MCG tablet Take 100 mcg by mouth daily before breakfast.    . metoprolol succinate (TOPROL-XL) 50 MG 24 hr tablet Take 1 tablet (50 mg total) by mouth 2 (two) times daily. Take with or immediately following a meal. 180  tablet 3  . spironolactone (ALDACTONE) 25 MG tablet Take 1 tablet (25 mg total) by mouth daily. 30 tablet 7  . amLODipine (NORVASC) 2.5 MG tablet Take 1 tablet (2.5 mg total) by mouth daily. (Patient not taking: Reported on 03/03/2017) 90 tablet 3  . furosemide (LASIX) 40 MG tablet Take 1 tablet (40 mg total) by mouth daily. (Patient not taking: Reported on 03/03/2017) 90 tablet 3  . levothyroxine (SYNTHROID, LEVOTHROID) 112 MCG tablet Take 1 tablet (112 mcg total) by mouth daily. (Patient not taking: Reported on 03/03/2017) 90 tablet 1  . nitroGLYCERIN (NITROSTAT) 0.4 MG SL tablet Place 1 tablet (0.4 mg total) under the tongue every 5 (five) minutes as needed for chest pain. 25 tablet 1  . Potassium Chloride ER 20 MEQ TBCR Take 20 mEq by mouth daily. (Patient not taking: Reported on 03/03/2017) 30 tablet 1    Drug Regimen Review  Drug regimen was reviewed and remains appropriate with no significant issues identified  Home: Home Living Family/patient expects to be discharged to:: Private  residence Living Arrangements: Alone Available Help at Discharge: Family, Available PRN/intermittently Type of Home: House Home Access: Stairs to enter Technical brewer of Steps: 6 Entrance Stairs-Rails: Right Home Layout: One level Bathroom Shower/Tub: Chiropodist: Handicapped height [place]: Environmental consultant - 2 wheels, [place] - single point   Functional History: Prior Function Level of Independence: Independent, Independent with assistive device(s) Comments: uses cane for community access, still drives,   Functional Status:  Mobility: Bed Mobility Overal bed mobility: Needs Assistance Bed Mobility: Supine to Sit Supine to sit: Min assist Sit to supine: Min assist General bed mobility comments: Min assist to help pt transition legs and hips to EOB.  She was using bed rail for leverage at trunk with help to reach for it and find it.   Transfers Overall transfer level:  Needs assistance Equipment used: Rolling walker (2 wheeled), None, 1 person hand held assist Transfers: Sit to/from Stand Sit to Stand: Min assist, Min guard General transfer comment: Min assist to get to standing without AD from bed.  Min guard assist to get to standing with RW, cues for safe hand placement and RW use during transitions.  Ambulation/Gait Ambulation/Gait assistance: Min assist, Min guard Ambulation Distance (Feet): 35 Feet Assistive device: Rolling walker (2 wheeled) Gait Pattern/deviations: Shuffle General Gait Details: Pt needed min hand held assist to walk around the room without RW, when we switched to RW she was min guard assist.  Of note, she kept running into obstacles on her right side.  I will ask OT to further assess vision during their assessment.  Gait velocity: decreased Gait velocity interpretation: Below normal speed for age/gender    ADL: ADL Overall ADL's : Needs assistance/impaired Eating/Feeding: Set up, Sitting, Supervision/ safety Grooming: Min guard, Oral care, Standing, Cueing for sequencing Grooming Details (indicate cue type and reason): Pt performing oral care at sink with Min Guard for safety. Pt demosntrating poor functional performance with decreased balance, problem solving, awareness, FM skills, motor planning, and grasp strength. Pt first attempting to open tooth paste at wrong end and required significant amount of time to realize she needed to twist the cap side. Pt presenting with poor motor planning and targeted reaching as seen when reaching toward faucet and missing. Pt seems to perseverate when brushing teeth and would repeat steps several times including filling her cup with water, sipping, then pouring it out and repeating several more time. Pt also loosing her balance once during grooming at sink and reached for sink to stabilize herself.  Upper Body Bathing: Minimal assistance, Sitting Lower Body Bathing: Minimal assistance, Sit  to/from stand Upper Body Dressing : Minimal assistance, Sitting Lower Body Dressing: Minimal assistance, Sit to/from stand Lower Body Dressing Details (indicate cue type and reason): Pt able to reach down for socks to pull them up. demonstrating poor FM skills during task and required increased time Toilet Transfer: Minimal assistance, Ambulation, RW Toilet Transfer Details (indicate cue type and reason): Simulated in room Functional mobility during ADLs: Min guard, Rolling walker(poor awarness and bumping into object on R) General ADL Comments: Pt demonstrating decreased functional performance compare to baseline. Pt presenting with decreased balance, vision, cognition, FM skills, and motor planning.  Cognition: Cognition Overall Cognitive Status: Impaired/Different from baseline Arousal/Alertness: Awake/alert Orientation Level: Oriented X4 Attention: Selective Selective Attention: Appears intact Problem Solving: Impaired Problem Solving Impairment: Functional basic Cognition Arousal/Alertness: Awake/alert Behavior During Therapy: WFL for tasks assessed/performed Overall Cognitive Status: Impaired/Different from baseline Area of Impairment: Attention, Following commands, Safety/judgement  Current Attention Level: Selective Following Commands: Follows multi-step commands inconsistently, Follows multi-step commands with increased time Safety/Judgement: Decreased awareness of safety, Decreased awareness of deficits Awareness: Emergent Problem Solving: Slow processing, Requires verbal cues, Requires tactile cues General Comments: Pt with decreased cognition as seen by poor problem solving, attention, and awareness. Pt demonstrating decreased problem solving as seen during grooming at sink. Pt attempted to open tooth paste by twisting the bottom of the tooth paste instead of the cap - required increased time to fingure out she was turning the wrong side, then pt awkwardly turn the cap without  repositioning her hands. Pt would bumpRW into objects on R side and had difficulty problem solving to move RW   Blood pressure (!) 148/78, pulse 80, temperature 98.9 F (37.2 C), temperature source Oral, resp. rate 18, height _0  (1.676 m), weight 100.7 kg (222 lb 0.1 oz), SpO2 99 %. Physical Exam  Nursing note and vitals reviewed. Constitutional: She is oriented to person, place, and time. She appears well-developed and well-nourished. No distress.  HENT:  Head: Normocephalic and atraumatic.  Mouth/Throat: Oropharynx is clear and moist.  Eyes: Conjunctivae and EOM are normal. Pupils are equal, round, and reactive to light. Right eye exhibits no discharge. Left eye exhibits no discharge.  Neck: Normal range of motion. Neck supple.  Cardiovascular: Normal rate and regular rhythm. No JVD. Respiratory: Effort normal and breath sounds normal. No stridor. She has no wheezes.  GI: Soft. Bowel sounds are normal. She exhibits no distension. There is no tenderness.  Musculoskeletal: She exhibits no edema or tenderness.  Neurological: She is alert and oriented to person, place, and time.  Motor: 4+-5/5 throughout Expressive >Receptive aphasia Skin: Skin is warm and dry. No rash noted. She is not diaphoretic.  Right groin puncture site with moist dressing but no drainage.    Psychiatric: She has a normal mood and affect. Her behavior is normal.    No results found for this or any previous visit (from the past 48 hour(s)). No results found.     Medical Problem List and Plan: 1.  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   Begin CIR 2.  H/oDVT/Anticoagulation: Pharmaceutical: Other (comment)--Eliquis 3. Pain Management: tylenol prn for back pain 4. Mood: LCSW to follow for evaluation and support.  5. Neuropsych: This patient is capable of making decisions on her own behalf. 6. Skin/Wound Care: routine  pressure relief measures 7. Fluids/Electrolytes/Nutrition:  Monitor I/O. Check lytes in am.  8. HTN: Monitor BP bid. Continue Metoprolol bid, Lotensin and Spironolactone 9. CAD/ICM: Managed with BB and statin.  10 CKD stage III: Monitor lytes with routine checks.  11. Hypothyroid: on supplement   Post Admission Physician Evaluation: 1. Preadmission assessment reviewed and changes made below. 2. 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 3. Patient is admitted to receive collaborative, interdisciplinary care between the physiatrist, rehab nursing staff, and therapy team. 4. Patient's level of medical complexity and substantial therapy needs in context of that medical necessity cannot be provided at a lesser intensity of care such as a SNF. 5. Patient has experienced substantial functional loss from his/her baseline which was documented above under the "Functional History" and "Functional Status" headings.  Judging by the patient's diagnosis, physical exam, and functional history, the patient has potential for functional progress which will result in measurable gains while on inpatient rehab.  These gains will be of substantial  and practical use upon discharge  in facilitating mobility and self-care at the household level. 27. Physiatrist will provide 24 hour management of medical needs as well as oversight of the therapy plan/treatment and provide guidance as appropriate regarding the interaction of the two. 7. 24 hour rehab nursing will assist with safety, disease management and patient education  and help integrate therapy concepts, techniques,education, etc. 8. PT will assess and treat for/with: Lower extremity strength, range of motion, stamina, balance, functional mobility, safety, adaptive techniques and equipment, coping skills, pain control, stroke education.   Goals are: Mod I. 9. OT will assess and treat for/with: ADL's, functional mobility,  safety, upper extremity strength, adaptive techniques and equipment, ego support, and community reintegration.   Goals are: Mod I. Therapy may proceed with showering this patient. 10. SLP will assess and treat for/with: speech, language, cognition.  Goals are: Supervision/Mod I. 11. Case Management and Social Worker will assess and treat for psychological issues and discharge planning. 12. Team conference will be held weekly to assess progress toward goals and to determine barriers to discharge. 13. Patient will receive at least 3 hours of therapy per day at least 5 days per week. 14. ELOS: 5-9 days.       15. Prognosis:  good   Delice Lesch, MD, ABPMR 03/07/2017 Bary Leriche, PA-C 03/06/2017

## 2017-03-07 NOTE — Evaluation (Signed)
Physical Therapy Assessment and Plan  Patient Details  Name: Yvonne Bailey MRN: [id number] Date of Birth: 12-Jun-1940  PT Diagnosis: Abnormality of gait, Cognitive deficits, Coordination disorder, Hemiplegia dominant, Impaired cognition, Impaired sensation and Muscle weakness Rehab Potential: Good ELOS: 10-14 days    Today's Date: 03/07/2017 PT Individual Time: 0805-0905 PT Individual Time Calculation (min): 60 min    Problem List:  Patient Active Problem List   Diagnosis Date Noted  . Global aphasia   . Benign essential HTN   . Acute ischemic cerebrovascular accident (CVA) involving left middle cerebral artery territory [facility])   . Acute ischemic left MCA stroke ([place]) 03/06/2017  . Expressive aphasia   . Coronary artery disease involving native coronary artery without angina pectoris   . Stage 3 chronic kidney disease ([place])   . Medically noncompliant   . Stroke (cerebrum) ([place]) 03/02/2017  . Prediabetes 07/28/2016  . DOE (dyspnea on exertion) 07/09/2016  . Gait instability 07/09/2016  . Itching 04/16/2016  . Unstable angina ([place]) 09/06/2015  . Cardiomyopathy, ischemic   . Chronic systolic heart failure ([place]) 08/21/2015  . Bilateral leg edema 07/19/2015  . Atrial fibrillation ([place]) 07/19/2015  . Urinary urgency 03/30/2015  . Other musculoskeletal symptoms referable to limbs(729.89) 12/17/2012  . Lacunar infarction 12/17/2012  . Small vessel disease, cerebrovascular 12/17/2012  . CAROTID BRUIT, RIGHT 08/10/2008  . Peripheral vascular disease ([place]) 06/04/2007  . Diverticulosis of large intestine 06/04/2007  . Hypothyroidism 02/24/2007  . Hyperlipidemia 02/24/2007  . Essential hypertension 02/24/2007  . DVT 01/21/2007  . Coronary atherosclerosis 10/29/2006    Past Medical History:  Past Medical History:  Diagnosis Date  . CAD (coronary artery disease)    S/P  anterior  wall myocardial infarction in '92, treated w/percutaneous transluminal coronary angioplasty of the  left anterior descending. EF 45%  . Carotid artery disease ([place])   . DVT (deep venous thrombosis) ([place])    X1  . History of nuclear stress test    a. Myoview 6/17: EF 20-25%, mid anteroseptal, apical anterior, apical septal, apical inferior, apical lateral and apical scar, no ischemia, intermediate risk  . HTN (hypertension)   . Hyperlipidemia   . Hypothyroidism   . Ischemic cardiomyopathy    a. Echo 6/17: EF 20-25%, apex appears akinetic, MAC, moderate MR, moderate LAE, mild RVE, trivial PI, PASP 47 mmHg (needs repeat with Definity contrast) //  b. Limited echo with Definity contrast 7/17: EF 25-30%, moderate to severe LAE  . MI (myocardial infarction) ([place]) 1992   at age 44; TIA treated with coumadin until Plavix initiated in 2006  . PAD (peripheral artery disease) ([facility])    Right SFA occlusion, severe disease left CFA and SFA   Past Surgical History:  Past Surgical History:  Procedure Laterality Date  . APPENDECTOMY     at hysterectomy and USO for fibroids, Dr. Ysidro Evert  . CARDIAC CATHETERIZATION  1992   Dr Eustace Quail  . CARDIAC CATHETERIZATION N/A 09/06/2015   Procedure: Right/Left Heart Cath and Coronary Angiography;  Surgeon: Burnell Blanks, MD;  Location: [place] [facility];  Service: Cardiovascular;  Laterality: N/A;  . CARDIAC CATHETERIZATION N/A 09/07/2015   Procedure: Coronary Stent Intervention;  Surgeon: Burnell Blanks, MD;  Location: [place] [facility];  Service: Cardiovascular;  Laterality: N/A;  . COLONOSCOPY     negative; 2008, Dr. Delfin Edis  . fracture LLE     '94; pinned  . IR ANGIO INTRA EXTRACRAN SEL COM CAROTID INNOMINATE BILAT MOD SED  03/02/2017  . IR ANGIO VERTEBRAL SEL VERTEBRAL UNI R MOD SED  03/02/2017  . RADIOLOGY WITH ANESTHESIA N/A 03/02/2017   Procedure: RADIOLOGY WITH ANESTHESIA;  Surgeon: Luanne Bras, MD;  Location: [place];  Service: Radiology;  Laterality: N/A;  . TONSILLECTOMY    . TOTAL ABDOMINAL HYSTERECTOMY     & BSO for  fibroids    Assessment & Plan Clinical Impression: Patient is a 76 y.o. right handed female with history of CAD/MI ischemic cardiomyopathy, hypertension, hyperlipidemia, DVT maintained on Eliquis and question medical compliance. Presented 03/02/2017 with acute onset of expressive aphasia. Per chart review patient lives alone independent with assistive device prior to admission. Independent with ADLs and still driving. One level home with 6 steps to entry. She has a son and granddaughter in the area that works. Cranial CT scan reviewed, unremarkable for acute process. Per report, multiple remote lacunar infarcts involving bilateral basal ganglia and right cerebral hemisphere. Patient did not receive TPA. CT angiogram head and neck showed loss of flow related enhancement of proximal left M2 branch suspicious for occlusion otherwise negative CTA for large vessel occlusion. 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 Bilateral lower extremity Dopplers negative. Echocardiogram showed no regional abnormalities. 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    Patient transferred to CIR on 03/06/2017 .   Patient currently requires min with mobility secondary to muscle weakness, decreased cardiorespiratoy endurance, impaired timing and sequencing, motor apraxia, decreased coordination and decreased motor planning, decreased visual perceptual skills and hemianopsia, decreased attention to right, decreased motor planning and ideational apraxia, decreased awareness, decreased problem solving, decreased safety awareness and delayed processing and decreased sitting balance, decreased standing balance, decreased postural control, hemiplegia  and decreased balance strategies.  Prior to hospitalization, patient was modified independent  with mobility and lived with Alone in a House home.  Home access is 6Stairs to enter.  Patient will benefit from skilled PT intervention to maximize safe functional mobility, minimize fall risk and decrease caregiver burden for planned discharge home with 24 hour supervision.  Anticipate patient will benefit from follow up [place] at discharge.  PT - End of Session Activity Tolerance: Tolerates 10 - 20 min activity with multiple rests Endurance Deficit: Yes PT Assessment Rehab Potential (ACUTE/IP ONLY): Good PT Patient demonstrates impairments in the following area(s): Behavior;Balance;Edema;Endurance;Motor;Nutrition;Perception;Safety;Sensory;Skin Integrity PT Transfers Functional Problem(s): Bed Mobility;Bed to Chair;Car;Furniture;Floor PT Locomotion Functional Problem(s): Ambulation;Stairs;Wheelchair Mobility PT Plan PT Intensity: Minimum of 1-2 x/day ,45 to 90 minutes PT Frequency: 5 out of 7 days PT Duration Estimated Length of Stay: 10-14 days  PT Treatment/Interventions: Ambulation/gait training;Balance/vestibular training;Cognitive remediation/compensation;Community reintegration;Disease management/prevention;Discharge planning;DME/adaptive equipment instruction;Functional electrical stimulation;Functional mobility training;Neuromuscular re-education;Pain management;Patient/family education;Psychosocial support;Skin care/wound management;Splinting/orthotics;Stair training;Therapeutic Activities;Therapeutic Exercise;Visual/perceptual remediation/compensation;UE/LE Coordination activities;UE/LE Strength taining/ROM;Wheelchair propulsion/positioning PT Transfers Anticipated Outcome(s): Mod I with LRAD  PT Locomotion Anticipated Outcome(s): Supervision assist with LRAD  PT Recommendation Recommendations for Other Services: Therapeutic Recreation consult;Neuropsych consult Therapeutic Recreation  Interventions: Stress management;Kitchen group;Outing/community reintergration Follow Up Recommendations: Home health PT Patient destination: Home Equipment Recommended: Rolling walker with 5" wheels;To be determined  Skilled Therapeutic Intervention PT instructed patient in PT Evaluation and initiated treatment intervention; see below for results. PT educated patient in [place], rehab potential, rehab goals, and discharge recommendations. Patient returned to room and left  sitting in [employer] with call bell in reach and all needs met.      PT Evaluation Precautions/Restrictions Precautions Precautions: Fall Precaution Comments: mild R inattention Restrictions Weight Bearing Restrictions: No General   Vital Signs  Pain Pain Assessment Pain Score: 0-No pain Home Living/Prior Functioning Home Living Available Help at Discharge: Family;Available PRN/intermittently Type of Home: House Home Access: Stairs to enter [employer] of Steps: 6 Entrance Stairs-Rails: Right Home Layout: One level Bathroom Shower/Tub: Chiropodist: Handicapped height  Lives With: Alone Prior Function Level of Independence: Requires assistive device for independence  Able to Take Stairs?: Yes Driving: Yes Vocation: Retired Comments: uses cane for community access, still drives. grand daughter helps with ADLs,  Vision/Perception  Vision - Assessment Ocular Range of Motion: Within Functional Limits Tracking/Visual Pursuits: Decreased smoothness of horizontal tracking;Decreased smoothness of vertical tracking Perception Perception: Impaired Inattention/Neglect: Does not attend to right visual field;Does not attend to right side of body Praxis Praxis: Impaired Praxis Impairment Details: Initiation;Motor planning  Cognition Overall Cognitive Status: Impaired/Different from baseline Orientation Level: Oriented X4 Memory: Appears intact Awareness: Impaired Safety/Judgment:  Impaired Comments: decreased awareness of physical and visual deficits.  Sensation Sensation Light Touch: Impaired by gross assessment Proprioception: Impaired by gross assessment Additional Comments: difficult to assess due to aphasia and apraxic deficits.  Coordination Gross Motor Movements are Fluid and Coordinated: No Coordination and Movement Description: dysmetric movement with poor initiation on the R  Finger Nose Finger Test: decreased speed bilaterally. decreased accuracy on the R from apparent visual and apaxic deficits.  Motor  Motor Motor: Hemiplegia;Motor apraxia;Motor impersistence Motor - Skilled Clinical Observations: Pt demonstrateds weakness in the LLE. poor initiation of movements in the RUE   Mobility Bed Mobility Bed Mobility: Rolling Right;Rolling Left;Supine to Sit;Sit to Supine Rolling Right: 4: Min assist Rolling Right Details: Manual facilitation for placement;Verbal cues for precautions/safety Rolling Left: 4: Min assist Rolling Left Details: Verbal cues for precautions/safety;Manual facilitation for placement Supine to Sit: 4: Min assist Sit to Supine: 4: Min assist Transfers Transfers: Yes Sit to Stand: 4: Min assist Sit to Stand Details: Verbal cues for technique;Verbal cues for precautions/safety Stand Pivot Transfers: 4: Min assist Stand Pivot Transfer Details: Verbal cues for technique;Verbal cues for precautions/safety  Car transfer: min assist without AD, pt required assistance to bring LLE into car.  Locomotion  Ambulation Ambulation: Yes Ambulation/Gait Assistance: 4: Min assist;3: Mod assist Ambulation Distance (Feet): 130 Feet Assistive device: 1 person hand held assist;None Ambulation/Gait Assistance Details: Verbal cues for technique;Verbal cues for precautions/safety;Verbal cues for sequencing;Manual facilitation for weight shifting Gait Gait: Yes Gait Pattern: Impaired Gait Pattern: Poor foot clearance - right;Poor foot clearance -  left;Wide base of support Stairs / Additional Locomotion Stairs: Yes Stairs Assistance: 4: Min assist Stairs Assistance Details: Verbal cues for technique;Verbal cues for precautions/safety;Verbal cues for gait pattern Stair Management Technique: Two rails Number of Stairs: 4 Height of Stairs: 6 Wheelchair Mobility Wheelchair Mobility: Yes Wheelchair Assistance: 4: Advertising account executive Details: Manual facilitation for placement;Verbal cues for technique;Verbal cues for precautions/safety;Verbal cues for safe use of DME/AE Wheelchair Propulsion: Both upper extremities Distance: 130f   Balance Balance Balance Assessed: Yes Static Sitting Balance Static Sitting - Level of Assistance: 5: Stand by assistance Dynamic Sitting Balance Dynamic Sitting - Level of Assistance: 5: Stand by assistance Static Standing Balance Static Standing - Balance Support: No upper extremity supported Static Standing - Level of Assistance: 4: Min assist Dynamic Standing Balance Dynamic Standing - Balance Support:  No upper extremity supported Dynamic Standing - Level of Assistance: 3: Mod assist;4: Min assist Extremity Assessment      RLE Assessment RLE Assessment: Within Functional Limits(grossly 4+/5 proximal to distal except hip flexion 4/5. ) LLE Assessment LLE Assessment: Exceptions to [facility] LLE Strength LLE Overall Strength Comments: grossly 4/5 proximal to distal except hip flexion 4-/5.    See Function Navigator for Current Functional Status.   Refer to Care Plan for Long Term Goals  Recommendations for other services: Therapeutic Recreation  Kitchen group, Stress management and Outing/community reintegration  Discharge Criteria: Patient will be discharged from PT if patient refuses treatment 3 consecutive times without medical reason, if treatment goals not met, if there is a change in medical status, if patient makes no progress towards goals or if patient is discharged from  hospital.  The above assessment, treatment plan, treatment alternatives and goals were discussed and mutually agreed upon: by patient  [name] 03/07/2017, 9:35 AM

## 2017-03-08 ENCOUNTER — Inpatient Hospital Stay (HOSPITAL_COMMUNITY): Payer: Medicare HMO | Admitting: Occupational Therapy

## 2017-03-08 ENCOUNTER — Inpatient Hospital Stay (HOSPITAL_COMMUNITY): Payer: Medicare HMO | Admitting: Physical Therapy

## 2017-03-08 ENCOUNTER — Inpatient Hospital Stay (HOSPITAL_COMMUNITY): Payer: Medicare HMO

## 2017-03-08 NOTE — Progress Notes (Signed)
Physical Therapy Session Note  Patient Details  Name: Yvonne Bailey MRN: [id number] Date of Birth: 09-28-1940  Today's Date: 03/08/2017 PT Individual Time: 1005-1100 PT Individual Time Calculation (min): 55 min   Short Term Goals: Week 1:  PT Short Term Goal 1 (Week 1): Pt will perform bed mobility with supervision assist  PT Short Term Goal 2 (Week 1): Pt will perform sit<>stand transfers with supervision assist  PT Short Term Goal 3 (Week 1): Pt will ambulate 117f with min assist consistently and LRAD  PT Short Term Goal 4 (Week 1): Pt will demonstrate improved attention to the R side with improved visual scanning 50% time and min cues   Skilled Therapeutic Interventions/Progress Updates:   Pt received supine in bed and agreeable to PT. Supine>sit transfer with supervision assist and min cues for sequecning. [facility] assist from PT. [facility] with RW x1520f PT provided min assist progressing to supervision assist as well as cues for attention to the R and increased step length on the L. PT instructed pt in dynamic balance training at dynavision. Program A x 3. A with Tscope x 3. A while standing on airex x 3. PT provided Supervision assist while standing on level surface, and mod assist to prevent posterior LOB while standing on airex. Pt returned to room and performed  Stand pivot transfer to bed with min assist. Sit>supine completed with min assist for the LLE, and Pt left supine in bed with call bell in reach and all needs met.         Therapy Documentation Precautions:  Precautions Precautions: Fall Precaution Comments: mild R inattention Restrictions Weight Bearing Restrictions: No General:   Vital Signs: Therapy Vitals Pulse Rate: 65 BP: 128/78 Patient Position (if appropriate): Lying Pain:   Mobility:   Locomotion :    Trunk/Postural Assessment :    Balance:   Exercises:   Other  Treatments:     See Function Navigator for Current Functional Status.   Therapy/Group: Individual Therapy  AuLorie Phenix2/29/2018, 11:01 AM

## 2017-03-08 NOTE — Progress Notes (Signed)
Speech Language Pathology Daily Session Note  Patient Details  Name: Yvonne Bailey MRN: [id number] Date of Birth: 1940-12-13  Today's Date: 03/08/2017 SLP Individual Time: 0100-0144 SLP Individual Time Calculation (min): 44 min  Short Term Goals: Week 1: SLP Short Term Goal 1 (Week 1): Patient will self-monitor and correct verbal errors in 90% of opportunities with supervision verbal cues.  SLP Short Term Goal 2 (Week 1): Patient will complete divergent naming tasks with Max A multimodal cues.  SLP Short Term Goal 3 (Week 1): Patient will demonstrate written expression at the basic CVC level with Mod A multimodal cues.  SLP Short Term Goal 4 (Week 1): Patient will demonstrate functional problem solving for basic and familiar tasks with Min A multimodal cues.  SLP Short Term Goal 5 (Week 1): Patient will attend to RUE/enviornment during functional tasks with Min A verbal cues.   Skilled Therapeutic Interventions: Skilled ST services focused on cognitive skills. SLP facilitated word finding skills in simple divergent naming tasks initially requiring max-mod A semantic cues, however following instruction in visualization technique pt required min- mod A semantic cues. Pt demonstrated strengths in convergent naming tasks with supervision A verbal cues. Pt demonstrated expressive errors in speech and the ability to monitor and correct errors with supervision cues 80% of the time. Pt required mod-min a verbal and visual cues to attention to right side during functional problem solving utilizing TV remote. Pt was left in bed with call bell within reach. Recommend to continue skilled ST services.  Function:  Eating Eating                 Cognition Comprehension Comprehension assist level: Follows basic conversation/direction with extra time/assistive device  Expression   Expression assist level: Expresses basic 75 - 89% of the time/requires cueing 10 - 24% of the time. Needs helper to  occlude trach/needs to repeat words.  Social Interaction Social Interaction assist level: Interacts appropriately 90% of the time - Needs monitoring or encouragement for participation or interaction.  Problem Solving Problem solving assist level: Solves basic 50 - 74% of the time/requires cueing 25 - 49% of the time  Memory Memory assist level: Recognizes or recalls 90% of the time/requires cueing < 10% of the time    Pain Pain Assessment Pain Assessment: No/denies pain  Therapy/Group: Individual Therapy  Nathanal Hermiz 03/08/2017, 1:50 PM

## 2017-03-08 NOTE — Progress Notes (Signed)
Occupational Therapy Session Note  Patient Details  Name: Yvonne Bailey MRN: [id number] Date of Birth: May 10, 1940  Today's Date: 03/08/2017 OT Individual Time: 0700-0800 and 1430-1459 OT Individual Time Calculation (min): 60 min and 29 min    Short Term Goals: Week 1:  OT Short Term Goal 1 (Week 1): Pt will transfer to toilet with supervision OT Short Term Goal 2 (Week 1): Pt will perform LB clothing management (up and down) with steadying A OT Short Term Goal 3 (Week 1): Pt will thread LB clothing with min A  OT Short Term Goal 4 (Week 1): Pt visually attend to the right with minimal questioning cues.  Skilled Therapeutic Interventions/Progress Updates:    Session 1: Upon entering the room, pt supine in bed and requesting assistance with food tray. Pt performed supine >sit with min A to EOB. Pt required assistance to cut food and open containers. Pt also needing mod multimodal cues to locate items on R side of tray during meal. Pt ambulating to bathroom with min A hand held. Pt having large BM and performed clothing management and hygiene with steady assistance for balance. Pt changing clothing items while seated on commode for time management. Pt required multiple rest breaks secondary to fatigue with functional tasks. Pt performed hand hygiene with steady assistance in standing with min verbal cues and increased time for sequencing. Pt returning to bed at end of session secondary to fatigue with steady assistance for balance.  Bed alarm activated and call bell within reach.   Session 2: Upon entering the room, pt supine in bed with c/o L foot pain. OT assessed but did not see any areas of concern. RN notified. Pt verbalizing need to toilet and ambulating with RW to bathroom with close supervision to bathroom. Pt unable to void. Pt standing from toilet with steady assistance for balance during clothing management. Pt returning to bed at end of session with call bell and bed alarm activated.    Therapy Documentation Precautions:  Precautions Precautions: Fall Precaution Comments: mild R inattention Restrictions Weight Bearing Restrictions: No General:   Vital Signs: Therapy Vitals Temp: 97.7 F (36.5 C) Temp Source: Oral Pulse Rate: 66 Resp: 18 BP: (!) 154/64 Patient Position (if appropriate): Lying Oxygen Therapy SpO2: 100 % O2 Device: Not Delivered Pain: Pain Assessment Pain Assessment: No/denies pain ADL: ADL ADL Comments: see functional navigator Vision   Perception    Praxis   Exercises:   Other Treatments:    See Function Navigator for Current Functional Status.   Therapy/Group: Individual Therapy  Alen Bleacher 03/08/2017, 4:32 PM

## 2017-03-08 NOTE — Plan of Care (Signed)
Pt continent of bladder Denies any pain

## 2017-03-08 NOTE — Progress Notes (Signed)
Subjective: Patient denies any complaints.  She is working with her therapist this morning.  She is ambulating from her sink to her bed with minimal assist.  Objective: BP 128/78 (BP Location: Right Arm)   Pulse 65   Temp 98.3 F (36.8 C) (Oral)   Resp 18   Ht 5\' 6"  (1.676 m)   Wt 224 lb 10.4 oz (101.9 kg)   SpO2 100%   BMI 36.26 kg/m   Elderly female in no acute distress.  She is working with therapist this morning. HEENT exam: Atraumatic, normocephalic, neck is supple without lymphadenopathy or thyromegaly. Chest is clear to auscultation without any increased work of breathing. Cardiac exam S1 and S2 are regular without gallop Abdominal exam overweight, active bowel sounds, soft. Extremities without significant edema.    Medical Problem List and Plan: 1.  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 2.  H/oDVT/Anticoagulation: Pharmaceutical: Other (comment)--Eliquis 3. Pain Management: tylenol prn for back pain 4. Mood: LCSW to follow for evaluation and support.  5. Neuropsych: This patient is capable of making decisions on her own behalf. 6. Skin/Wound Care: routine pressure relief measures 7. Fluids/Electrolytes/Nutrition:   Basic Metabolic Panel:    Component Value Date/Time   NA 139 03/07/2017 0601   K 3.7 03/07/2017 0601   CL 106 03/07/2017 0601   CO2 24 03/07/2017 0601   BUN 20 03/07/2017 0601   CREATININE 1.08 (H) 03/07/2017 0601   CREATININE 1.14 (H) 09/15/2015 1025   GLUCOSE 101 (H) 03/07/2017 0601   CALCIUM 8.5 (L) 03/07/2017 0601    8. HTN: Monitor BP bid.  Adequate control.  Continue current medications. 9. CAD/ICM: Managed with BB and statin.  10 CKD stage III: Monitor lytes with routine checks.  Creatinine and GFR are stable. 11. Hypothyroid: on supplement  Lab Results  Component Value Date   TSH 4.34 02/04/2017

## 2017-03-09 ENCOUNTER — Inpatient Hospital Stay (HOSPITAL_COMMUNITY): Payer: Medicare HMO | Admitting: Occupational Therapy

## 2017-03-09 ENCOUNTER — Inpatient Hospital Stay (HOSPITAL_COMMUNITY): Payer: Medicare HMO

## 2017-03-09 NOTE — Progress Notes (Signed)
Occupational Therapy Session Note  Patient Details  Name: Yvonne Bailey MRN: [id number] Date of Birth: May 25, 1940  Today's Date: 03/09/2017 OT Individual Time: 0051-1021 OT Individual Time Calculation (min): 57 min    Short Term Goals: Week 1:  OT Short Term Goal 1 (Week 1): Pt will transfer to toilet with supervision OT Short Term Goal 2 (Week 1): Pt will perform LB clothing management (up and down) with steadying A OT Short Term Goal 3 (Week 1): Pt will thread LB clothing with min A  OT Short Term Goal 4 (Week 1): Pt visually attend to the right with minimal questioning cues.  Skilled Therapeutic Interventions/Progress Updates:    P"t completed transfer from bed to wheelchair for bathing with min assist using the RW and mod demonstrational cueing for hand placement.  She was able to then use the RW to gather clothing for dressing with min guard assist as well.  Pt chose to sponge bathe instead of taking a shower secondary to it being later in the day.  She demonstrated decreased motor planning and awareness at times, noted with dropping of the washcloth from the right hand without awareness of this.  In addition, when given a new washcloth she proceeded to try and wash with it without wetting it or applying soap.  Pt states "I don't know why I have trouble with the easiest things".  Re-emphasized to the pt that the reason for this was her stroke and it's normal to have problems like this after an event like she has experienced.  She was able to complete most bathing with min assist, but did need max assist to remove clothing and dirty brief from her LEs.  She was able to thread brief and pants for dressing with setup, but needed max assist for socks. Pt requested transfer back to  Bed at end of session with bed alarm in place and call button and phone in reach.    Therapy Documentation Precautions:  Precautions Precautions: Fall Precaution Comments: mild R inattention, motor planning  deficits Restrictions Weight Bearing Restrictions: No Pain: Pain Assessment Pain Assessment: No/denies pain ADL: See Function Navigator for Current Functional Status.   Therapy/Group: Individual Therapy  Dak Szumski OTR/L 03/09/2017, 4:04 PM

## 2017-03-09 NOTE — Progress Notes (Signed)
Subjective: Patient has no complaints.  She is feeling well.  She slept well.  She answers questions by saying yes or no or nodding her head  Objective: BP (!) 155/89 (BP Location: Right Arm)   Pulse 71   Temp 98.4 F (36.9 C) (Oral)   Resp 19   Ht 5\' 6"  (1.676 m)   Wt 224 lb 10.4 oz (101.9 kg)   SpO2 99%   BMI 36.26 kg/m   Elderly female in no acute distress.  HEENT exam atraumatic, normocephalic Chest is clear to auscultation without increased work of breathing Cardiac exam S1-S2 regular Abdominal exam overweight, active bowel sounds, soft Extremities no edema.   Medical Problem List and Plan: 1.  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 2.  H/oDVT/Anticoagulation: Pharmaceutical: Other (comment)--Eliquis 3. Pain Management: tylenol prn for back pain 4. Mood: LCSW to follow for evaluation and support.  5. Neuropsych: This patient is capable of making decisions on her own behalf. 6. Skin/Wound Care: routine pressure relief measures 7. Fluids/Electrolytes/Nutrition:   Basic Metabolic Panel:    Component Value Date/Time   NA 139 03/07/2017 0601   K 3.7 03/07/2017 0601   CL 106 03/07/2017 0601   CO2 24 03/07/2017 0601   BUN 20 03/07/2017 0601   CREATININE 1.08 (H) 03/07/2017 0601   CREATININE 1.14 (H) 09/15/2015 1025   GLUCOSE 101 (H) 03/07/2017 0601   CALCIUM 8.5 (L) 03/07/2017 0601    8. HTN: Monitor BP bid.  Adequate control.  Continue current medications. 128/78-155/89 9. CAD/ICM: Managed with BB and statin.  10 CKD stage III: Monitor lytes with routine checks.  Creatinine and GFR are stable. 11. Hypothyroid: on supplement  Lab Results  Component Value Date   TSH 4.34 02/04/2017

## 2017-03-09 NOTE — Plan of Care (Signed)
Not Progressing RH BLADDER ELIMINATION RH STG MANAGE BLADDER WITH ASSISTANCE Description STG Manage Bladder With Min Assistance  03/09/2017 1137 - Not Progressing by Fabian Sharp, RN Note Pt incontinent of bladder with urgency issues. Pt will call for bathroom but has already wet briefs.

## 2017-03-09 NOTE — Progress Notes (Signed)
Physical Therapy Session Note  Patient Details  Name: LOURAINE Bailey MRN: [id number] Date of Birth: Oct 10, 1940  Today's Date: 03/09/2017 PT Individual Time: 1002-1059 PT Individual Time Calculation (min): 57 min   Short Term Goals: Week 1:  PT Short Term Goal 1 (Week 1): Pt will perform bed mobility with supervision assist  PT Short Term Goal 2 (Week 1): Pt will perform sit<>stand transfers with supervision assist  PT Short Term Goal 3 (Week 1): Pt will ambulate 14ft with min assist consistently and LRAD  PT Short Term Goal 4 (Week 1): Pt will demonstrate improved attention to the R side with improved visual scanning 50% time and min cues   Skilled Therapeutic Interventions/Progress Updates:    Pt seated in recliner upon PT arrival, agreeable to therapy tx and denies pain. Pt seated in recliner donned pants and performed sit<>stand with min assist to pull over hips. Pt transferred from recliner>w/c with min assist, stand pivot using RW. Pt transported to the gym in w/c. Pt ambulated 2 x 100 ft using RW and min assist, verbal cues for foot clearance and increased step length. Pt worked on dynamic standing balance without UE support in order to perform toe taps on numbered 4 inch step, toe taps on cones, stepping in all directions,and stepping to colored dots, with min to mod assist. Pt used nustep x 7 minutes on workload 5 for global strengthening and endurance. Pt ambulated x 150 ft back to room with min assist using RW, left seated in recliner with needs in reach.   Therapy Documentation Precautions:  Precautions Precautions: Fall Precaution Comments: mild R inattention Restrictions Weight Bearing Restrictions: No   See Function Navigator for Current Functional Status.   Therapy/Group: Individual Therapy  Cresenciano Genre, PT, DPT 03/09/2017, 7:50 AM

## 2017-03-10 ENCOUNTER — Inpatient Hospital Stay (HOSPITAL_COMMUNITY): Payer: Medicare HMO | Admitting: Occupational Therapy

## 2017-03-10 ENCOUNTER — Inpatient Hospital Stay (HOSPITAL_COMMUNITY): Payer: Medicare HMO

## 2017-03-10 ENCOUNTER — Encounter (HOSPITAL_COMMUNITY): Payer: Self-pay | Admitting: Interventional Radiology

## 2017-03-10 DIAGNOSIS — D72829 Elevated white blood cell count, unspecified: Secondary | ICD-10-CM

## 2017-03-10 LAB — BASIC METABOLIC PANEL
Anion gap: 8 (ref 5–15)
BUN: 14 mg/dL (ref 6–20)
CO2: 21 mmol/L — ABNORMAL LOW (ref 22–32)
Calcium: 8.7 mg/dL — ABNORMAL LOW (ref 8.9–10.3)
Chloride: 110 mmol/L (ref 101–111)
Creatinine, Ser: 0.97 mg/dL (ref 0.44–1.00)
GFR calc Af Amer: >60 mL/min (ref >60)
GFR calc non Af Amer: 55 mL/min — ABNORMAL LOW (ref >60)
Glucose, Bld: 107 mg/dL — ABNORMAL HIGH (ref 65–99)
Potassium: 3.6 mmol/L (ref 3.5–5.1)
Sodium: 139 mmol/L (ref 135–145)

## 2017-03-10 LAB — URINALYSIS, ROUTINE W REFLEX MICROSCOPIC
Bilirubin Urine: NEGATIVE
Glucose, UA: NEGATIVE mg/dL
Hgb urine dipstick: NEGATIVE
Ketones, ur: NEGATIVE mg/dL
Leukocytes, UA: NEGATIVE
Nitrite: NEGATIVE
Protein, ur: NEGATIVE mg/dL
Specific Gravity, Urine: 1.015 (ref 1.005–1.030)
pH: 5 (ref 5.0–8.0)

## 2017-03-10 MED ORDER — BENAZEPRIL HCL 10 MG PO TABS
30.0000 mg | ORAL_TABLET | Freq: Every day | ORAL | Status: DC
  Administered 2017-03-10 – 2017-03-18 (×9): 30 mg via ORAL
  Filled 2017-03-10 (×9): qty 1

## 2017-03-10 NOTE — OR Nursing (Signed)
Addendum created to correct Recovery Care Complete and Procedural Care Complete correct date

## 2017-03-10 NOTE — Discharge Instructions (Addendum)
Inpatient Rehab Discharge Instructions  Yvonne Bailey Discharge date and time: 03/18/17   Activities/Precautions/ Functional Status: Activity: activity as tolerated Diet: regular diet Wound Care: none needed   Functional status:  ___ No restrictions     ___ Walk up steps independently _X__ 24/7 supervision/assistance   ___ Walk up steps with assistance ___ Intermittent supervision/assistance  ___ Bathe/dress independently ___ Walk with walker                                       _X__ Bathe/dress with assistance ___ Walk Independently    ___ Shower independently ___ Walk with assistance    ___ Shower with assistance _X__ No alcohol     ___ Return to work/school ________     _x STROKE/TIA DISCHARGE INSTRUCTIONS SMOKING Cigarette smoking nearly doubles your risk of having a stroke & is the single most alterable risk factor  If you smoke or have smoked in the last 12 months, you are advised to quit smoking for your health.  Most of the excess cardiovascular risk related to smoking disappears within a year of stopping.  Ask you doctor about anti-smoking medications  [place] Quit Line: 1-800-QUIT NOW  Free Smoking Cessation Classes [phone]  CHOLESTEROL Know your levels; limit fat & cholesterol in your diet  Lipid Panel     Component Value Date/Time   CHOL 151 03/03/2017 0551   TRIG 62 03/03/2017 0551   HDL 45 03/03/2017 0551   CHOLHDL 3.4 03/03/2017 0551   VLDL 12 03/03/2017 0551   LDLCALC 94 03/03/2017 0551      Many patients benefit from treatment even if their cholesterol is at goal.  Goal: Total Cholesterol (CHOL) less than 160  Goal:  Triglycerides (TRIG) less than 150  Goal:  HDL greater than 40  Goal:  LDL (LDLCALC) less than 100   BLOOD PRESSURE American Stroke Association blood pressure target is less that 120/80 mm/Hg  Your discharge blood pressure is:  BP: (!) 147/64  Monitor your blood pressure  Limit your salt and alcohol intake  Many  individuals will require more than one medication for high blood pressure  DIABETES (A1c is a blood sugar average for last 3 months) Goal HGBA1c is under 7% (HBGA1c is blood sugar average for last 3 months)  Diabetes: No known diagnosis of diabetes    Lab Results  Component Value Date   HGBA1C 5.4 03/03/2017     Your HGBA1c can be lowered with medications, healthy diet, and exercise.  Check your blood sugar as directed by your physician  Call your physician if you experience unexplained or low blood sugars.  PHYSICAL ACTIVITY/REHABILITATION Goal is 30 minutes at least 4 days per week  Activity: Increase activity slowly, and No driving, Therapies: Physical Therapy: Home Health Return to work: N/A  Activity decreases your risk of heart attack and stroke and makes your heart stronger.  It helps control your weight and blood pressure; helps you relax and can improve your mood.  Participate in a regular exercise program.  Talk with your doctor about the best form of exercise for you (dancing, walking, swimming, cycling).  DIET/WEIGHT Goal is to maintain a healthy weight  Your discharge diet is: Diet Heart Room service appropriate? Yes; Fluid consistency: Thin  liquids Your height is:  Height: 5\' 6"  (167.6 cm) Your current weight is: Weight: 101.9 kg (224 lb 10.4 oz) Your Body  Mass Index (BMI) is:  BMI (Calculated): 36.28  Following the type of diet specifically designed for you will help prevent another stroke.  Your goal weight range is:  155 lbs  Your goal Body Mass Index (BMI) is 19-24.  Healthy food habits can help reduce 3 risk factors for stroke:  High cholesterol, hypertension, and excess weight.  RESOURCES Stroke/Support Group:  Call [phone]   STROKE EDUCATION PROVIDED/REVIEWED AND GIVEN TO PATIENT Stroke warning signs and symptoms How to activate emergency medical system (call 911). Medications prescribed at discharge. Need for follow-up after discharge. Personal  risk factors for stroke. Pneumonia vaccine given:  Flu vaccine given:  My questions have been answered, the writing is legible, and I understand these instructions.  I will adhere to these goals & educational materials that have been provided to me after my discharge from the hospital.    COMMUNITY REFERRALS UPON DISCHARGE:   Home Health:   PT     OT      ST    Agency:  [facility] Phone:  [phone] Medical Equipment/Items Ordered:  Rolling walker; bedside commode; tub transfer bench  Agency/Supplier:  [facility]          Phone:  [phone]  GENERAL COMMUNITY RESOURCES FOR PATIENT/FAMILY: Support Groups:  [facility] Stroke Support Group                              Meets the second Thursday of each month 3-4PM (except June, July, and August)                              In the dayroom of [facility], [address]                              For more information, call [phone]  Special Instructions:    My questions have been answered and I understand these instructions. I will adhere to these goals and the provided educational materials after my discharge from the hospital.  Patient/Caregiver Signature _______________________________ Date __________  Clinician Signature _______________________________________ Date __________  Please bring this form and your medication list with you to all your follow-up doctor's appointments. Information on my medicine - ELIQUIS (apixaban)  This medication education was reviewed with me or my healthcare representative as part of my discharge preparation.  Why was Eliquis prescribed for you? Eliquis was prescribed for you to reduce the risk of a blood clot forming that can cause a stroke if you have a medical condition called atrial fibrillation (a type of irregular heartbeat).  What do You need to know about Eliquis ? Take your Eliquis TWICE DAILY - one tablet in the morning and one  tablet in the evening with or without food. If you have difficulty swallowing the tablet whole please discuss with your pharmacist how to take the medication safely.  Take Eliquis exactly as prescribed by your doctor and DO NOT stop taking Eliquis without talking to the doctor who prescribed the medication.  Stopping may increase your risk of developing a stroke.  Refill your prescription before you run out.  After discharge, you should have regular check-up appointments with your healthcare provider that is prescribing your Eliquis.  In the future your dose may need to be changed if your kidney function or weight changes  by a significant amount or as you get older.  What do you do if you miss a dose? If you miss a dose, take it as soon as you remember on the same day and resume taking twice daily.  Do not take more than one dose of ELIQUIS at the same time to make up a missed dose.  Important Safety Information A possible side effect of Eliquis is bleeding. You should call your healthcare provider right away if you experience any of the following: ? Bleeding from an injury or your nose that does not stop. ? Unusual colored urine (red or dark brown) or unusual colored stools (red or black). ? Unusual bruising for unknown reasons. ? A serious fall or if you hit your head (even if there is no bleeding).  Some medicines may interact with Eliquis and might increase your risk of bleeding or clotting while on Eliquis. To help avoid this, consult your healthcare provider or pharmacist prior to using any new prescription or non-prescription medications, including herbals, vitamins, non-steroidal anti-inflammatory drugs (NSAIDs) and supplements.  This website has more information on Eliquis (apixaban): http://www.eliquis.com/eliquis/home

## 2017-03-10 NOTE — Progress Notes (Signed)
Speech Language Pathology Daily Session Note  Patient Details  Name: Yvonne Bailey MRN: [id number] Date of Birth: 1940-10-20  Today's Date: 03/10/2017 SLP Individual Time: 1300-1345 SLP Individual Time Calculation (min): 45 min  Short Term Goals: Week 1: SLP Short Term Goal 1 (Week 1): Patient will self-monitor and correct verbal errors in 90% of opportunities with supervision verbal cues.  SLP Short Term Goal 2 (Week 1): Patient will complete divergent naming tasks with Max A multimodal cues.  SLP Short Term Goal 3 (Week 1): Patient will demonstrate written expression at the basic CVC level with Mod A multimodal cues.  SLP Short Term Goal 4 (Week 1): Patient will demonstrate functional problem solving for basic and familiar tasks with Min A multimodal cues.  SLP Short Term Goal 5 (Week 1): Patient will attend to RUE/enviornment during functional tasks with Min A verbal cues.   Skilled Therapeutic Interventions: Skilled ST services focused on cognitive skills. SLP facilitated witting of CVC, CCVC and CVCC words by py copying example in large print, pt demonstrated 100% accuracy copying CVC words and 60% accuracy for CCVC/ CVCC words. Pt required supervision A question cues to monitor errors during witting tasks and mod-min A verbal/visual cues to correct errors. SLP facilitated divergent naming tasks utilizing word finding strategies visualization and categorizing, pt required Mod A verbal cues. Pt was left in room with call bell within reach. Recommend to continue skilled ST services.      Function:  Eating Eating   Modified Consistency Diet: No Eating Assist Level: Set up assist for   Eating Set Up Assist For: Opening containers       Cognition Comprehension Comprehension assist level: Follows basic conversation/direction with no assist  Expression   Expression assist level: Expresses basic 75 - 89% of the time/requires cueing 10 - 24% of the time. Needs helper to occlude  trach/needs to repeat words.  Social Interaction Social Interaction assist level: Interacts appropriately with others with medication or extra time (anti-anxiety, antidepressant).  Problem Solving Problem solving assist level: Solves basic 50 - 74% of the time/requires cueing 25 - 49% of the time  Memory Memory assist level: Recognizes or recalls 75 - 89% of the time/requires cueing 10 - 24% of the time    Pain Pain Assessment Pain Assessment: No/denies pain Pain Score: 0-No pain  Therapy/Group: Individual Therapy  [name]  [facility] 03/10/2017, 3:43 PM

## 2017-03-10 NOTE — Progress Notes (Addendum)
Physical Therapy Note  Patient Details  Name: Yvonne Bailey MRN: [id number] Date of Birth: Sep 26, 1940 Today's Date: 03/10/2017  6063-0160, 65 min individual tx Pain: none per pt  Stand pivot recliner> w/c> toilet for attempted voiding, without results.  W/c propulsion on level tile with min assist for steering , using bil UEs.  Seated activity sitting on wedge for neuro re-ed, to decrease posterior pelvic tilt and increase forward wt shift.  Activity involved bil hand activity removing lids from Rx bottles, with extra time and mod cues to remember technique to use for the 3 different types of bottles. Reaching activity with feet unsupported in sitting to facilitate trunk shortening/lengthening/rotating to elicit trunk righting. Gait training with grocery cart x 150' with min guard/supervision assist for steering.  Pt left resting in recliner with all needs within reach,falls risk and use of call bell reiterated with pt.  tx 2:  1445-1515, 30 min individual tx Pain: none per pt  Pt stated she was tired, but willing to participate.  Seated neuromuscular re-education via multimodal cues and demo for 10 x 1 each R/L hip flex, active assistive heel slides, 2 x 10 ankle DF/PF and trunk flex/ext to tap targets with hands. Sit>< stand without use of UEs for increased loading of RLE and eccentric control when sitting.  In standing, pt demonstrated delayed ankle and hip strategies bil,  given external perturbations.   Pt transferred back to bed and left resting with alarm set and all needs within reach.   See function navigator for current status.    Caetano Oberhaus 03/10/2017, 8:24 AM

## 2017-03-10 NOTE — Plan of Care (Signed)
  Progressing RH SKIN INTEGRITY RH STG SKIN FREE OF INFECTION/BREAKDOWN Description No new breakdown with min assist   03/10/2017 1001 - Progressing by Dani Gobble, RN RH SAFETY RH STG ADHERE TO SAFETY PRECAUTIONS W/ASSISTANCE/DEVICE Description STG Adhere to Safety Precautions With min Assistance/Device.  03/10/2017 1001 - Progressing by Dani Gobble, RN   Not Progressing RH BLADDER ELIMINATION RH STG MANAGE BLADDER WITH ASSISTANCE Description STG Manage Bladder With Min Assistance  03/10/2017 1001 - Not Progressing by Dani Gobble, RN Note Requiring total assistance for incontinence

## 2017-03-10 NOTE — Progress Notes (Signed)
[place] PHYSICAL MEDICINE & REHABILITATION     PROGRESS NOTE  Subjective/Complaints:  Pt states she slept well overnight and had a good weekend. Speech is improving.  Discussed pt's urine with nursing.  Pt denies symptoms.   ROS: Denies CP, SOB, N/V/D, dysuria.  Objective: Vital Signs: Blood pressure (!) 144/72, pulse (!) 56, temperature 97.8 F (36.6 C), temperature source Oral, resp. rate 18, height 5\' 6"  (1.676 m), weight 101.9 kg (224 lb 10.4 oz), SpO2 100 %. No results found. No results for input(s): WBC, HGB, HCT, PLT in the last 72 hours. Recent Labs    03/10/17 0728  NA 139  K 3.6  CL 110  GLUCOSE 107*  BUN 14  CREATININE 0.97  CALCIUM 8.7*   CBG (last 3)  No results for input(s): GLUCAP in the last 72 hours.  Wt Readings from Last 3 Encounters:  03/06/17 101.9 kg (224 lb 10.4 oz)  03/03/17 100.7 kg (222 lb 0.1 oz)  02/20/17 102.9 kg (226 lb 12.8 oz)    Physical Exam:  BP (!) 144/72 (BP Location: Right Arm)   Pulse (!) 56   Temp 97.8 F (36.6 C) (Oral)   Resp 18   Ht 5\' 6"  (1.676 m)   Wt 101.9 kg (224 lb 10.4 oz)   SpO2 100%   BMI 36.26 kg/m  Constitutional: She appears well-developed and well-nourished. No distress.  HENT: Normocephalic and atraumatic.  Eyes: EOM are normal. No discharge.  Cardiovascular: Normal rate and regular rhythm. No JVD. Respiratory: Effort normal and breath sounds normal.  GI: Bowel sounds are normal. She exhibits no distension.  Musculoskeletal: She exhibits no edema or tenderness.  Neurological: She is alert and oriented.  Motor: 4+-5/5 throughout  Expressive >Receptive aphasia (improving) Skin: Skin is warm and dry. No rash noted. She is not diaphoretic.  Right groin puncture site with moist dressing but no drainage.    Psychiatric: She has a normal mood and affect. Her behavior is normal.    Assessment/Plan: 1. Functional deficits secondary to left parietal MCA territory infarct  which require 3+ hours per day of  interdisciplinary therapy in a comprehensive inpatient rehab setting. Physiatrist is providing close team supervision and 24 hour management of active medical problems listed below. Physiatrist and rehab team continue to assess barriers to discharge/monitor patient progress toward functional and medical goals.  Function:  Bathing Bathing position   Position: Wheelchair/chair at sink  Bathing parts Body parts bathed by patient: Right arm, Left arm, Chest, Abdomen, Front perineal area, Right upper leg, Left upper leg, Right lower leg, Left lower leg, Buttocks Body parts bathed by helper: Back  Bathing assist Assist Level: Touching or steadying assistance(Pt > 75%)      Upper Body Dressing/Undressing Upper body dressing   What is the patient wearing?: Pull over shirt/dress, Bra Bra - Perfomed by patient: Thread/unthread right bra strap, Thread/unthread left bra strap Bra - Perfomed by helper: Hook/unhook bra (pull down sports bra) Pull over shirt/dress - Perfomed by patient: Thread/unthread left sleeve, Put head through opening, Pull shirt over trunk Pull over shirt/dress - Perfomed by helper: Thread/unthread right sleeve        Upper body assist Assist Level: Touching or steadying assistance(Pt > 75%)      Lower Body Dressing/Undressing Lower body dressing   What is the patient wearing?: Underwear, Pants, Non-skid slipper socks Underwear - Performed by patient: Thread/unthread right underwear leg, Thread/unthread left underwear leg, Pull underwear up/down Underwear - Performed by helper: Thread/unthread  left underwear leg, Thread/unthread right underwear leg Pants- Performed by patient: Pull pants up/down, Thread/unthread right pants leg, Thread/unthread left pants leg Pants- Performed by helper: Thread/unthread left pants leg   Non-skid slipper socks- Performed by helper: Don/doff right sock, Don/doff left sock       Shoes - Performed by helper: Don/doff right shoe, Don/doff  left shoe       TED Hose - Performed by helper: Don/doff right TED hose, Don/doff left TED hose  Lower body assist Assist for lower body dressing: (max a)      Toileting Toileting   Toileting steps completed by patient: Adjust clothing prior to toileting, Performs perineal hygiene, Adjust clothing after toileting Toileting steps completed by helper: Adjust clothing prior to toileting, Performs perineal hygiene, Adjust clothing after toileting Toileting Assistive Devices: Grab bar or rail  Toileting assist Assist level: Touching or steadying assistance (Pt.75%)   Transfers Chair/bed transfer   Chair/bed transfer method: Stand pivot Chair/bed transfer assist level: Touching or steadying assistance (Pt > 75%) Chair/bed transfer assistive device: Armrests, Patent attorney     Max distance: 100 ft Assist level: Touching or steadying assistance (Pt > 75%)   Wheelchair   Type: Manual Max wheelchair distance: 17ft  Assist Level: Touching or steadying assistance (Pt > 75%)  Cognition Comprehension Comprehension assist level: Follows basic conversation/direction with no assist  Expression Expression assist level: Expresses basic 90% of the time/requires cueing < 10% of the time.  Social Interaction Social Interaction assist level: Interacts appropriately with others with medication or extra time (anti-anxiety, antidepressant).  Problem Solving Problem solving assist level: Solves basic 50 - 74% of the time/requires cueing 25 - 49% of the time  Memory Memory assist level: Recognizes or recalls 75 - 89% of the time/requires cueing 10 - 24% of the time    Medical Problem List and Plan: 1.  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   Cont CIR 2.  H/oDVT/Anticoagulation: Pharmaceutical: Other (comment)--Eliquis 3. Pain Management: tylenol prn for back pain 4. Mood: LCSW to follow for  evaluation and support.  5. Neuropsych: This patient is capable of making decisions on her own behalf. 6. Skin/Wound Care: routine pressure relief measures 7. Fluids/Electrolytes/Nutrition:  Monitor I/O. Check lytes in am.  8. HTN: Monitor BP bid. Continue Metoprolol bid and Spironolactone    Lotensin increased to 30 on 12/31 9. CAD/ICM: Managed with BB and statin.  10 CKD stage III:    Cr. .97 on 12/31, improving to baseline 11. Hypothyroid: on supplement 12. Leukocytosis   WBCs 10.9 on 12/31   Labs ordered for tomorrow   Afebrile   Cont to monitor   LOS (Days) 4 A FACE TO FACE EVALUATION WAS PERFORMED  Taejon Irani Karis Juba 03/10/2017 9:05 AM

## 2017-03-10 NOTE — Progress Notes (Signed)
Occupational Therapy Session Note  Patient Details  Name: Yvonne Bailey MRN: [id number] Date of Birth: Jul 06, 1940  Today's Date: 03/10/2017 OT Individual Time: 0800-0900 OT Individual Time Calculation (min): 60 min    Short Term Goals: Week 1:  OT Short Term Goal 1 (Week 1): Pt will transfer to toilet with supervision OT Short Term Goal 2 (Week 1): Pt will perform LB clothing management (up and down) with steadying A OT Short Term Goal 3 (Week 1): Pt will thread LB clothing with min A  OT Short Term Goal 4 (Week 1): Pt visually attend to the right with minimal questioning cues.  Skilled Therapeutic Interventions/Progress Updates:    Pt received sitting EOB finishing breakfast, agreeable to OT tx session, no c/o pain. Pt ambulates within room at RW level with MinA to obtain clothing from dresser, transfers to TTB with MinA. Pt completes bathing from sit<>stand level with close MinGuard during standing portions. Pt requires min verbal cues to wash all body parts; requires mod verbal cues for use of soap/opening soap container as Pt initially attempting to use bottle of liquid soap as if it were a bar of soap. Pt also requires min cues after dropping washcloth while washing buttocks as she continues to wash without recognizing she does not have washcloth in hand. Pt transfers to w/c for UB/LB dressing with MinA using RW. Pt requires MinA for positioning bra properly to clasp and increased time. Requires Max verbal cues for threading RUE properly into overhead shirt as Pt initially threading RUE into neck as well as lattice of sleeve. Pt dons brief and pants with increased time, min steadying assist in standing to advance over hips. Total assist for footwear due to time management and as Pt with increased fatigue. Pt requires mod questioning cues for completing grooming ADLs properly including positioning and proper use of items. Pt completes stand pivot w/c to recliner end of session with MinA  using RW. Pt left seated in recliner with needs within reach.    Therapy Documentation Precautions:  Precautions Precautions: Fall Precaution Comments: mild R inattention, motor planning deficits Restrictions Weight Bearing Restrictions: No   Pain: Pain Assessment Pain Assessment: No/denies pain Pain Score: 0-No pain ADL: ADL ADL Comments: see functional navigator  See Function Navigator for Current Functional Status.   Therapy/Group: Individual Therapy  Orlando Penner 03/10/2017, 11:45 AM

## 2017-03-10 NOTE — Progress Notes (Signed)
Inpatient Rehabilitation Center Individual Statement of Services  Patient Name:  Yvonne Bailey  Date:  03/10/2017  Welcome to the Inpatient Rehabilitation Center.  Our goal is to provide you with an individualized program based on your diagnosis and situation, designed to meet your specific needs.  With this comprehensive rehabilitation program, you will be expected to participate in at least 3 hours of rehabilitation therapies Monday-Friday, with modified therapy programming on the weekends.  Your rehabilitation program will include the following services:  Physical Therapy (PT), Occupational Therapy (OT), Speech Therapy (ST), 24 hour per day rehabilitation nursing, Case Management (Social Worker), Rehabilitation Medicine, Nutrition Services and Pharmacy Services  Weekly team conferences will be held on Wednesdays to discuss your progress.  Your Social Worker will talk with you frequently to get your input and to update you on team discussions.  Team conferences with you and your family in attendance may also be held.  Expected length of stay:  10 to 14 days  Overall anticipated outcome:  Supervision  Depending on your progress and recovery, your program may change. Your Social Worker will coordinate services and will keep you informed of any changes. Your Social Worker's name and contact numbers are listed  below.  The following services may also be recommended but are not provided by the Inpatient Rehabilitation Center:   Driving Evaluations  Home Health Rehabiltiation Services  Outpatient Rehabilitation Services   Arrangements will be made to provide these services after discharge if needed.  Arrangements include referral to agencies that provide these services.  Your insurance has been verified to be:  [employer] Your primary doctor is:  Dr. Cheryll Cockayne  Pertinent information will be shared with your doctor and your insurance company.  Social Worker:  Staci Acosta,  LCSW  [phone] or [phone]  Information discussed with and copy given to patient by: Elvera Lennox, 03/10/2017, 3:18 PM

## 2017-03-10 NOTE — Progress Notes (Signed)
Social Work Assessment and Plan  Patient Details  Name: Yvonne Bailey MRN: [id number] Date of Birth: [birth year]  Today's Date: 03/10/2017  Problem List:  Patient Active Problem List   Diagnosis Date Noted  . Leukocytosis   . Global aphasia   . Benign essential HTN   . Acute ischemic cerebrovascular accident (CVA) involving left middle cerebral artery territory [facility])   . Acute ischemic left MCA stroke ([place]) 03/06/2017  . Expressive aphasia   . Coronary artery disease involving native coronary artery without angina pectoris   . Stage 3 chronic kidney disease ([place])   . Medically noncompliant   . Stroke (cerebrum) ([place]) 03/02/2017  . Prediabetes 07/28/2016  . DOE (dyspnea on exertion) 07/09/2016  . Gait instability 07/09/2016  . Itching 04/16/2016  . Unstable angina ([place]) 09/06/2015  . Cardiomyopathy, ischemic   . Chronic systolic heart failure ([place]) 08/21/2015  . Bilateral leg edema 07/19/2015  . Atrial fibrillation ([place]) 07/19/2015  . Urinary urgency 03/30/2015  . Other musculoskeletal symptoms referable to limbs(729.89) 12/17/2012  . Lacunar infarction 12/17/2012  . Small vessel disease, cerebrovascular 12/17/2012  . CAROTID BRUIT, RIGHT 08/10/2008  . Peripheral vascular disease ([place]) 06/04/2007  . Diverticulosis of large intestine 06/04/2007  . Hypothyroidism 02/24/2007  . Hyperlipidemia 02/24/2007  . Essential hypertension 02/24/2007  . DVT 01/21/2007  . Coronary atherosclerosis 10/29/2006   Past Medical History:  Past Medical History:  Diagnosis Date  . CAD (coronary artery disease)    S/P  anterior  wall myocardial infarction in '92, treated w/percutaneous transluminal coronary angioplasty of the left anterior descending. EF 45%  . Carotid artery disease ([place])   . DVT (deep venous thrombosis) ([place])    X1  . History of nuclear stress test    a. Myoview 6/17: EF 20-25%, mid anteroseptal, apical anterior, apical septal, apical inferior, apical lateral and  apical scar, no ischemia, intermediate risk  . HTN (hypertension)   . Hyperlipidemia   . Hypothyroidism   . Ischemic cardiomyopathy    a. Echo 6/17: EF 20-25%, apex appears akinetic, MAC, moderate MR, moderate LAE, mild RVE, trivial PI, PASP 47 mmHg (needs repeat with Definity contrast) //  b. Limited echo with Definity contrast 7/17: EF 25-30%, moderate to severe LAE  . MI (myocardial infarction) ([place]) 1992   at age 74; TIA treated with coumadin until Plavix initiated in 2006  . PAD (peripheral artery disease) ([facility])    Right SFA occlusion, severe disease left CFA and SFA   Past Surgical History:  Past Surgical History:  Procedure Laterality Date  . APPENDECTOMY     at hysterectomy and USO for fibroids, Dr. Ysidro Evert  . CARDIAC CATHETERIZATION  1992   Dr Eustace Quail  . CARDIAC CATHETERIZATION N/A 09/06/2015   Procedure: Right/Left Heart Cath and Coronary Angiography;  Surgeon: Burnell Blanks, MD;  Location: [place] [facility];  Service: Cardiovascular;  Laterality: N/A;  . CARDIAC CATHETERIZATION N/A 09/07/2015   Procedure: Coronary Stent Intervention;  Surgeon: Burnell Blanks, MD;  Location: [place] [facility];  Service: Cardiovascular;  Laterality: N/A;  . COLONOSCOPY     negative; 2008, Dr. Delfin Edis  . fracture LLE     '94; pinned  . IR ANGIO INTRA EXTRACRAN SEL COM CAROTID INNOMINATE BILAT MOD SED  03/02/2017  . IR ANGIO VERTEBRAL SEL VERTEBRAL UNI R MOD SED  03/02/2017  . RADIOLOGY WITH ANESTHESIA N/A 03/02/2017   Procedure: RADIOLOGY WITH ANESTHESIA;  Surgeon: Luanne Bras, MD;  Location: [facility];  Service: Radiology;  Laterality: N/A;  . TONSILLECTOMY    . TOTAL ABDOMINAL HYSTERECTOMY     & BSO for fibroids   Social History:  reports that she quit smoking about 27 years ago. she has never used smokeless tobacco. She reports that she does not drink alcohol or use drugs.  Family / Support Systems Marital Status: Widow/Widower How Long?: pt cannot quite  remember Patient Roles: Parent, Other (Comment)(cousin; grandmother; church member; friend) Children: [name] - son - [phone] Other Supports: Roxy Cedar - granddaughter - [phone] Anticipated Caregiver: Roxy Cedar - granddaughter Ability/Limitations of Caregiver: Wendall Mola was made aware of pt's need for 24/7 supervision by CSW and she stated that she would be the one to be with pt. Caregiver Availability: 24/7(per granddtr) Family Dynamics: supportive relationship with pt and family  Social History Preferred language: English Religion: Non-Denominational Education: college - education major at [employer]: Yes Write: Yes Employment Status: Retired Date Retired/Disabled/Unemployed: 1994 Age Retired: 52 Public relations account executive Issues: none reported Guardian/Conservator: N/A - MD has determined that pt is capable of making her own decisions.   Abuse/Neglect Abuse/Neglect Assessment Can Be Completed: Yes Physical Abuse: Denies Verbal Abuse: Denies Sexual Abuse: Denies Exploitation of patient/patient's resources: Denies Self-Neglect: Denies  Emotional Status Pt's affect, behavior and adjustment status: Pt reports feeling well emotionally and is motivated to get better.  She was laughing with CSW and remains positive and upbeat about her rehabilitation. Recent Psychosocial Issues: none reported Psychiatric History: none reported Substance Abuse History: none reported  Patient / Family Perceptions, Expectations & Goals Pt/Family understanding of illness & functional limitations: Pt reports a good understanding of her condition and limitations. Premorbid pt/family roles/activities: Pt sings in the choir at church.  She enjoys Psychologist, occupational and social activities with her sorority. Anticipated changes in roles/activities/participation: Pt would like to resume activities as she is able. Pt/family expectations/goals: Pt wants her "language to improve and return to  normal."  [employer]: None Premorbid Home Care/DME Agencies: None Transportation available at discharge: family Resource referrals recommended: Support group (specify), Neuropsychology(stroke support group)  Discharge Planning Living Arrangements: Alone Support Systems: Children, Other relatives, Friends/neighbors, Social worker community Type of Residence: Private residence Insurance Resources: Multimedia programmer (specify)([employer] Medicare) Financial Resources: Radio broadcast assistant Screen Referred: No Money Management: Patient Does the patient have any problems obtaining your medications?: No Home Management: Pt's younger granddtr helps her with the heavy cleaning.  Pt does light housekeeping chores.  Homeowner's association takes care of outside of pt's house.   Patient/Family Preliminary Plans: Pt's older granddtr, Jasmine, plans to be with pt 24/7 to provide supervision and support at home. Social Work Anticipated Follow Up Needs: HH/OP, Support Group Expected length of stay: 10 to 14 days  Clinical Impression CSW met with pt to introduce self and role of CSW, as well as to complete assessment.  Then, CSW spoke with pt's grandtr, Delana Meyer, who will be caring for her at home, providing 24/7 supervision.  Pt is pleased with her progress so far and is hopeful she will just continue to improve.  Pt's language is impacted by the stroke, but CSW could tell what pt was trying to say, even with her word finding difficulties.  Pt is concerned about this, but it does not seem to frustrate her.  Wendall Mola is stating she can provide the 24/7 supervision and does have transportation to get pt to appts and she can come for family education closer to d/c.  Pt has a cane (needs  repairs or replacing) and some kind of rolling walker (pt said 4 wheels without a seat?).  She will need a shower aid for a tub shower and was already planning to get grab bars prior to the stroke, so  she will follow through on this.  CSW explained she would need to do that as it is private pay, as would be a tub bench.  CSW can order tub bench and other DME and set up f/u therapies.  Pt and granddtr with no further questions/concerns/needs at this time.  CSW will continue to follow and assist as needed.  , Silvestre Mesi 03/10/2017, 3:41 PM

## 2017-03-11 ENCOUNTER — Inpatient Hospital Stay (HOSPITAL_COMMUNITY): Payer: Medicare HMO | Admitting: Occupational Therapy

## 2017-03-11 ENCOUNTER — Inpatient Hospital Stay (HOSPITAL_COMMUNITY): Payer: Medicare HMO | Admitting: Physical Therapy

## 2017-03-11 DIAGNOSIS — I6350 Cerebral infarction due to unspecified occlusion or stenosis of unspecified cerebral artery: Secondary | ICD-10-CM

## 2017-03-11 DIAGNOSIS — R8290 Unspecified abnormal findings in urine: Secondary | ICD-10-CM

## 2017-03-11 LAB — CBC WITH DIFFERENTIAL/PLATELET
Basophils Absolute: 0.1 10*3/uL (ref 0.0–0.1)
Basophils Relative: 1 %
Eosinophils Absolute: 0.2 10*3/uL (ref 0.0–0.7)
Eosinophils Relative: 2 %
HCT: 39.3 % (ref 36.0–46.0)
Hemoglobin: 12.9 g/dL (ref 12.0–15.0)
Lymphocytes Relative: 28 %
Lymphs Abs: 2.8 10*3/uL (ref 0.7–4.0)
MCH: 31.6 pg (ref 26.0–34.0)
MCHC: 32.8 g/dL (ref 30.0–36.0)
MCV: 96.3 fL (ref 78.0–100.0)
Monocytes Absolute: 0.7 10*3/uL (ref 0.1–1.0)
Monocytes Relative: 7 %
Neutro Abs: 6.4 10*3/uL (ref 1.7–7.7)
Neutrophils Relative %: 62 %
Platelets: 240 10*3/uL (ref 150–400)
RBC: 4.08 MIL/uL (ref 3.87–5.11)
RDW: 13.4 % (ref 11.5–15.5)
WBC: 10.2 10*3/uL (ref 4.0–10.5)

## 2017-03-11 LAB — URINE CULTURE: Special Requests: NORMAL

## 2017-03-11 NOTE — Progress Notes (Signed)
Physical Therapy Session Note  Patient Details  Name: Yvonne Bailey MRN: [id number] Date of Birth: 1940-07-14  Today's Date: 03/11/2017 PT Individual Time: 0900-1000 PT Individual Time Calculation (min): 60 min   Short Term Goals: Week 1:  PT Short Term Goal 1 (Week 1): Pt will perform bed mobility with supervision assist  PT Short Term Goal 2 (Week 1): Pt will perform sit<>stand transfers with supervision assist  PT Short Term Goal 3 (Week 1): Pt will ambulate 142ft with min assist consistently and LRAD  PT Short Term Goal 4 (Week 1): Pt will demonstrate improved attention to the R side with improved visual scanning 50% time and min cues   Skilled Therapeutic Interventions/Progress Updates:    Pt seated in recliner in room, agreeable to participate in therapy session. Pt reports no pain this AM. Attempt to have pt don tennis shoes but they do not fit well due to BLE swelling. Stand pivot transfer Min Assist with RW for balance. Ambulation 2 x 150 ft with RW and CGA for balance. Trial ambulation with handrail in hallway, Min Assist for balance, pt exhibits decreased gait speed and decreased B step length when ambulating with only one UE support. BERG Balance Test performed, 23/56 (high fall risk), see details below. Tandem ambulation in // bars with BUE support and CGA for balance, visual and verbal cues to perform correctly. Pt left seated in recliner in room with needs in reach.  Therapy Documentation Precautions:  Precautions Precautions: Fall Precaution Comments: mild R inattention, motor planning deficits Restrictions Weight Bearing Restrictions: No    Balance: Balance Balance Assessed: Yes Standardized Balance Assessment Standardized Balance Assessment: Berg Balance Test Berg Balance Test Sit to Stand: Able to stand using hands after several tries Standing Unsupported: Able to stand 2 minutes with supervision Sitting with Back Unsupported but Feet Supported on Floor or  Stool: Able to sit safely and securely 2 minutes Stand to Sit: Controls descent by using hands Transfers: Able to transfer with verbal cueing and /or supervision Standing Unsupported with Eyes Closed: Able to stand 10 seconds with supervision Standing Ubsupported with Feet Together: Needs help to attain position but able to stand for 30 seconds with feet together From Standing, Reach Forward with Outstretched Arm: Loses balance while trying/requires external support From Standing Position, Pick up Object from Floor: Unable to try/needs assist to keep balance From Standing Position, Turn to Look Behind Over each Shoulder: Turn sideways only but maintains balance Turn 360 Degrees: Needs close supervision or verbal cueing Standing Unsupported, Alternately Place Feet on Step/Stool: Able to complete >2 steps/needs minimal assist Standing Unsupported, One Foot in Front: Needs help to step but can hold 15 seconds Standing on One Leg: Unable to try or needs assist to prevent fall Total Score: 23  See Function Navigator for Current Functional Status.   Therapy/Group: Individual Therapy  Peter Congo, PT, DPT  03/11/2017, 12:50 PM

## 2017-03-11 NOTE — Progress Notes (Signed)
Occupational Therapy Session Note  Patient Details  Name: Yvonne Bailey MRN: [id number] Date of Birth: 1940-07-26  Today's Date: 03/11/2017 OT Individual Time: 1345-1430 OT Individual Time Calculation (min): 45 min    Short Term Goals: Week 1:  OT Short Term Goal 1 (Week 1): Pt will transfer to toilet with supervision OT Short Term Goal 2 (Week 1): Pt will perform LB clothing management (up and down) with steadying A OT Short Term Goal 3 (Week 1): Pt will thread LB clothing with min A  OT Short Term Goal 4 (Week 1): Pt visually attend to the right with minimal questioning cues.  Skilled Therapeutic Interventions/Progress Updates:    session 2: treatment session focused on ther activity for cognition including sequencing, problem solving, activity tolerance, number coordinance and ordering. Standing tolerance and functional over head reaching task in therapy kitchen. Pt required v/c for hand/foot placementduring task, able to recall directions of there activity. Demo'ed overhead reach using B UE without support and no LOB. Performed side stepping with use of countertop for support. Pt demo'ed difficulty with number ordering and sequencing high to low numbers 1-10 and required min verbal cues for sorting. Noted difficulty with scanning L to R. Continue to address cognition for ADL/IADL tasks.   Therapy Documentation Precautions:  Precautions Precautions: Fall Precaution Comments: mild R inattention, motor planning deficits Restrictions Weight Bearing Restrictions: No   Pain: Pain Assessment Pain Assessment: No/denies pain ADL: ADL ADL Comments: see functional navigator See Function Navigator for Current Functional Status.   Therapy/Group: Individual Therapy  Verdie Shire 03/11/2017, 2:25 PM

## 2017-03-11 NOTE — Plan of Care (Signed)
  Progressing RH BLADDER ELIMINATION RH STG MANAGE BLADDER WITH ASSISTANCE Description STG Manage Bladder With Min Assistance  03/11/2017 1148 - Progressing by Dani Gobble, RN RH SKIN INTEGRITY RH STG SKIN FREE OF INFECTION/BREAKDOWN Description No new breakdown with min assist   03/11/2017 1148 - Progressing by Dani Gobble, RN RH SAFETY RH STG ADHERE TO SAFETY PRECAUTIONS W/ASSISTANCE/DEVICE Description STG Adhere to Safety Precautions With min Assistance/Device.  03/11/2017 1148 - Progressing by Dani Gobble, RN

## 2017-03-11 NOTE — Progress Notes (Addendum)
[facility] PH[facility]ICAL MEDICINE & REHABILITATION     PROGRESS NOTE  Subjective/Complaints:  Pt seen sitting up in her chair this AM eating breakfast.  She slept well overnight.  She notes improvement in speech.    ROS: Denies CP, SOB, N/V/D, dysuria.  Objective: Vital Signs: Blood pressure 135/84, pulse 70, temperature 98 F (36.7 C), temperature source Oral, resp. rate 18, height 5\' 6"  (1.676 m), weight 101.9 kg (224 lb 10.4 oz), SpO2 97 %. No results found. No results for input(s): WBC, HGB, HCT, PLT in the last 72 hours. Recent Labs    03/10/17 0728  NA 139  K 3.6  CL 110  GLUCOSE 107*  BUN 14  CREATININE 0.97  CALCIUM 8.7*   CBG (last 3)  No results for input(s): GLUCAP in the last 72 hours.  Wt Readings from Last 3 Encounters:  03/06/17 101.9 kg (224 lb 10.4 oz)  03/03/17 100.7 kg (222 lb 0.1 oz)  02/20/17 102.9 kg (226 lb 12.8 oz)    Physical Exam:  BP 135/84 (BP Location: Left Arm)   Pulse 70   Temp 98 F (36.7 C) (Oral)   Resp 18   Ht 5\' 6"  (1.676 m)   Wt 101.9 kg (224 lb 10.4 oz)   SpO2 97%   BMI 36.26 kg/m  Constitutional: She appears well-developed and well-nourished. No distress.  HENT: Normocephalic and atraumatic.  Eyes: EOM are normal. No discharge.  Cardiovascular: RRR. No JVD. Respiratory: Effort normal and breath sounds normal.  GI: Bowel sounds are normal. She exhibits no distension.  Musculoskeletal: She exhibits no edema or tenderness.  Neurological: She is alert and oriented.  Motor: 4+-5/5 throughout  Expressive >Receptive aphasia (continues to improve) Skin: Skin is warm and dry. No rash noted. She is not diaphoretic.  Psychiatric: She has a normal mood and affect. Her behavior is normal.    Assessment/Plan: 1. 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 Physiatrist is providing close team supervision and 24  hour management of active medical problems listed below. Physiatrist and rehab team continue to assess barriers to discharge/monitor patient progress toward functional and medical goals.  Function:  Bathing Bathing position   Position: Shower  Bathing parts Body parts bathed by patient: Right arm, Left arm, Chest, Abdomen, Front perineal area, Right upper leg, Left upper leg, Right lower leg, Left lower leg, Buttocks Body parts bathed by helper: Back  Bathing assist Assist Level: Touching or steadying assistance(Pt > 75%)      Upper Body Dressing/Undressing Upper body dressing   What is the patient wearing?: Pull over shirt/dress, Bra Bra - Perfomed by patient: Thread/unthread right bra strap, Thread/unthread left bra strap, Hook/unhook bra (pull down sports bra) Bra - Perfomed by helper: Hook/unhook bra (pull down sports bra) Pull over shirt/dress - Perfomed by patient: Thread/unthread left sleeve, Put head through opening, Pull shirt over trunk Pull over shirt/dress - Perfomed by helper: Thread/unthread right sleeve        Upper body assist Assist Level: Touching or steadying assistance(Pt > 75%)      Lower Body Dressing/Undressing Lower body dressing   What is the patient wearing?: Underwear, Pants, Non-skid slipper socks Underwear - Performed by patient: Thread/unthread right underwear leg, Thread/unthread left underwear leg, Pull underwear up/down Underwear - Performed by helper: Thread/unthread left underwear leg, Thread/unthread right underwear leg Pants- Performed by patient: Pull pants up/down, Thread/unthread right  pants leg, Thread/unthread left pants leg Pants- Performed by helper: Thread/unthread left pants leg   Non-skid slipper socks- Performed by helper: Don/doff right sock, Don/doff left sock       Shoes - Performed by helper: Don/doff right shoe, Don/doff left shoe       TED Hose - Performed by helper: Don/doff right TED hose, Don/doff left TED hose  Lower  body assist Assist for lower body dressing: Touching or steadying assistance (Pt > 75%)      Toileting Toileting   Toileting steps completed by patient: Adjust clothing prior to toileting, Performs perineal hygiene, Adjust clothing after toileting Toileting steps completed by helper: Adjust clothing after toileting Toileting Assistive Devices: Grab bar or rail  Toileting assist Assist level: Touching or steadying assistance (Pt.75%)   Transfers Chair/bed transfer   Chair/bed transfer method: Stand pivot Chair/bed transfer assist level: Touching or steadying assistance (Pt > 75%) Chair/bed transfer assistive device: Armrests, Patent attorney     Max distance: 150 Assist level: Touching or steadying assistance (Pt > 75%)   Wheelchair   Type: Manual Max wheelchair distance: 125 Assist Level: Touching or steadying assistance (Pt > 75%)  Cognition Comprehension Comprehension assist level: Follows basic conversation/direction with no assist  Expression Expression assist level: Expresses basic 75 - 89% of the time/requires cueing 10 - 24% of the time. Needs helper to occlude trach/needs to repeat words.  Social Interaction Social Interaction assist level: Interacts appropriately with others with medication or extra time (anti-anxiety, antidepressant).  Problem Solving Problem solving assist level: Solves basic 50 - 74% of the time/requires cueing 25 - 49% of the time  Memory Memory assist level: Recognizes or recalls 75 - 89% of the time/requires cueing 10 - 24% of the time    Medical Problem List and Plan: 1.  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   Cont CIR 2.  H/oDVT/Anticoagulation: Pharmaceutical: Other (comment)--Eliquis 3. Pain Management: tylenol prn for back pain 4. Mood: LCSW to follow for evaluation and support.  5. Neuropsych: This patient is capable of making  decisions on her own behalf. 6. Skin/Wound Care: routine pressure relief measures 7. Fluids/Electrolytes/Nutrition:  Monitor I/O. Check lytes in am.  8. HTN: Monitor BP bid. Continue Metoprolol bid and Spironolactone   Lotensin increased to 30 on 12/31   Relatively controlled on 1/1 9. CAD/ICM: Managed with BB and statin.  10 CKD stage III:    Cr. .97 on 12/31, improving to baseline 11. Hypothyroid: on supplement 12. Leukocytosis   WBCs 10.9 on 12/31   Labs pending   Afebrile   Cont to monitor 13. Foul urine   Ucx pending   LOS (Days) 5 A FACE TO FACE EVALUATION WAS PERFORMED  Ankit Karis Juba 03/11/2017 8:30 AM

## 2017-03-11 NOTE — Progress Notes (Signed)
Physical Therapy Session Note  Patient Details  Name: Yvonne Bailey MRN: [id number] Date of Birth: 09-20-1940  Today's Date: 03/11/2017 PT Individual Time: 8343-7357 PT Individual Time Calculation (min): 15 min  and Today's Date: 03/11/2017 PT Missed Time: 15 Minutes Missed Time Reason: Other (Comment)(visitors/pt refusal )  Short Term Goals: Week 1:  PT Short Term Goal 1 (Week 1): Pt will perform bed mobility with supervision assist  PT Short Term Goal 2 (Week 1): Pt will perform sit<>stand transfers with supervision assist  PT Short Term Goal 3 (Week 1): Pt will ambulate 114f with min assist consistently and LRAD  PT Short Term Goal 4 (Week 1): Pt will demonstrate improved attention to the R side with improved visual scanning 50% time and min cues   Skilled Therapeutic Interventions/Progress Updates:   Pt in w/c upon arrival and agreeable to therapy, no c/o pain. Ambulated 150' x2 w/ min guard and seated rest in between 2/2 fatigue. Verbal cues for gait pattern. Pt had many visitors present and requested to end session early. Returned to room and ended session in w/c and in care of family, all needs met.   Therapy Documentation Precautions:  Precautions Precautions: Fall Precaution Comments: mild R inattention, motor planning deficits Restrictions Weight Bearing Restrictions: No General: PT Amount of Missed Time (min): 15 Minutes PT Missed Treatment Reason: Other (Comment)(visitors/pt refusal ) Vital Signs: Therapy Vitals Temp: 98.1 F (36.7 C) Temp Source: Oral Pulse Rate: 70 Resp: 16 BP: (!) 135/59 Patient Position (if appropriate): Sitting Oxygen Therapy SpO2: 99 % O2 Device: Not Delivered  See Function Navigator for Current Functional Status.   Therapy/Group: Individual Therapy  Tonga Prout K Arnette 03/11/2017, 4:58 PM

## 2017-03-11 NOTE — Progress Notes (Signed)
Occupational Therapy Session Note  Patient Details  Name: Yvonne Bailey MRN: [id number] Date of Birth: Feb 08, 1941  Today's Date: 03/11/2017 OT Individual Time: 1000-1100 OT Individual Time Calculation (min): 60 min    Short Term Goals: Week 1:  OT Short Term Goal 1 (Week 1): Pt will transfer to toilet with supervision OT Short Term Goal 2 (Week 1): Pt will perform LB clothing management (up and down) with steadying A OT Short Term Goal 3 (Week 1): Pt will thread LB clothing with min A  OT Short Term Goal 4 (Week 1): Pt visually attend to the right with minimal questioning cues.  Skilled Therapeutic Interventions/Progress Updates:   session 1:  Treatment session focused on ADLs/self care training, transfer training, balance training, NMR, pt education on safety awareness and problem solving. Upon entering pt resting in recliner chair and agreeable to AM ADLs. Pt able to transfer from chair with walker to retreive clothes with min A with v/c for hand/foot placement for transfer and functional mobility safety. Noted decreased spatial awareness and perception when looking through drawers for clothes. Required additional cuing and reminders for items needed for shower. Pt transferred into shower with min A with use of grab bars. Completed UB d/b with set up A and S ; noted difficulty with sequencing and fastening bra. Completed LB d/b with min A for removing socks in shower and looping briefs in correct holes. Pt completed grooming at sink in w/c with set up A. Pt left resting in w/c with needs met.  Thearpist provided verbal reminders during transfers for hand/foot placement, and safety awareness techniques during ADLs.  Pt continues to demo decreased activity tolerance, motor planning/seuqncing and problem solving skills.   Therapy Documentation Precautions:  Precautions Precautions: Fall Precaution Comments: mild R inattention, motor planning deficits Restrictions Weight Bearing  Restrictions: No    Pain: Pain Assessment Pain Assessment: No/denies pain Pain Score: 0-No pain ADL: ADL ADL Comments: see functional navigator See Function Navigator for Current Functional Status.   Therapy/Group: Individual Therapy  Delon Sacramento 03/11/2017, 12:10 PM

## 2017-03-12 ENCOUNTER — Inpatient Hospital Stay (HOSPITAL_COMMUNITY): Payer: Medicare HMO | Admitting: Physical Therapy

## 2017-03-12 ENCOUNTER — Inpatient Hospital Stay (HOSPITAL_COMMUNITY): Payer: Medicare HMO | Admitting: Speech Pathology

## 2017-03-12 ENCOUNTER — Inpatient Hospital Stay (HOSPITAL_COMMUNITY): Payer: Medicare HMO

## 2017-03-12 NOTE — Progress Notes (Addendum)
Physical Therapy Note  Patient Details  Name: Yvonne Bailey MRN: [id number] Date of Birth: 01-06-1941 Today's Date: 03/12/2017    Time: 1100-1126 26 minutes  1:1 no c/o pain.  Pt requests to use restroom.  Pt able to perform gait into bathroom with RW and min guard.  toilet transfers, toileting and clothing management all with supervision. Supervision for standing balance to wash hands.  Gait to/from ADL apartment with close supervision and RW.  Furniture transfers to recliner with supervision, couch with min A.  Pt states she avoids sitting on low surfaces at home.  Pt left in room with needs at hand.   Time 2: 1310-1410 60 minutes  1:1 No c/o pain.  Pt performs gait 150' x 2 with supervision with RW.  Obstacle negotiation and stepping over obstacles with RW with min guard.  Gait without AD for balance training 100' x 2 with min HHA.  Standing balance on foam with reaching task all directions with min guard, cues to prevent posterior lean.  Standing balance tap ups to 4'' step with min/mod A, difficulty lifting LEs onto step when fatigued.  Seated peg board activity with pt requiring mod cuing for problem solving, tactile cues for use of Rt hand.  Pt left in room with needs at hand, bed alarm on.  Britney Newstrom 03/12/2017, 11:56 AM

## 2017-03-12 NOTE — Progress Notes (Signed)
Speech Language Pathology Daily Session Note  Patient Details  Name: Yvonne Bailey MRN: [id number] Date of Birth: [birth year]  Today's Date: 03/12/2017 SLP Individual Time: 0803-0902 SLP Individual Time Calculation (min): 59 min  Short Term Goals: Week 1: SLP Short Term Goal 1 (Week 1): Patient will self-monitor and correct verbal errors in 90% of opportunities with supervision verbal cues.  SLP Short Term Goal 2 (Week 1): Patient will complete divergent naming tasks with Max A multimodal cues.  SLP Short Term Goal 3 (Week 1): Patient will demonstrate written expression at the basic CVC level with Mod A multimodal cues.  SLP Short Term Goal 4 (Week 1): Patient will demonstrate functional problem solving for basic and familiar tasks with Min A multimodal cues.  SLP Short Term Goal 5 (Week 1): Patient will attend to RUE/enviornment during functional tasks with Min A verbal cues.   Skilled Therapeutic Interventions:  Pt was seen for skilled ST targeting cognitive-linguistic goals.  Pt was in bed upon arrival but was agreeable to getting up for therapies and was transferred to wheelchair.  Pt needed min-mod assist verbal cues for task sequencing and organization when brushing her teeth due to what appeared to be motor planning impairment (pt attempting to brush her teeth with tube of toothpaste).  Pt was able to verbally identify safety problems in pictures at the phrase/sentence level with very subtle min assist verbal cues for expansion of utterances.  Additionally, pt was able to write targeted words from picture cards with anywhere from min to max assist for letter formation and awareness of written errors.  Pt was returned to room and left in wheelchair with call bell within reach.  Continue per current plan of care.    Function:  Eating Eating                 Cognition Comprehension Comprehension assist level: Follows basic conversation/direction with extra time/assistive device   Expression   Expression assist level: Expresses basic 75 - 89% of the time/requires cueing 10 - 24% of the time. Needs helper to occlude trach/needs to repeat words.  Social Interaction Social Interaction assist level: Interacts appropriately with others with medication or extra time (anti-anxiety, antidepressant).  Problem Solving Problem solving assist level: Solves basic 75 - 89% of the time/requires cueing 10 - 24% of the time  Memory Memory assist level: Recognizes or recalls 75 - 89% of the time/requires cueing 10 - 24% of the time    Pain Pain Assessment Pain Assessment: No/denies pain  Therapy/Group: Individual Therapy  Yoana Staib, Melanee Spry 03/12/2017, 9:04 AM

## 2017-03-12 NOTE — Plan of Care (Signed)
  Progressing RH BLADDER ELIMINATION RH STG MANAGE BLADDER WITH ASSISTANCE Description STG Manage Bladder With Min Assistance  03/12/2017 1629 - Progressing by Alfonso Ramus, RN RH SKIN INTEGRITY RH STG SKIN FREE OF INFECTION/BREAKDOWN Description No new breakdown with min assist   03/12/2017 1629 - Progressing by Alfonso Ramus, RN RH SAFETY RH STG ADHERE TO SAFETY PRECAUTIONS W/ASSISTANCE/DEVICE Description STG Adhere to Safety Precautions With min Assistance/Device.  03/12/2017 1629 - Progressing by Alfonso Ramus, RN

## 2017-03-12 NOTE — Progress Notes (Signed)
Occupational Therapy Session Note  Patient Details  Name: Yvonne Bailey MRN: [id number] Date of Birth: 1941/03/06  Today's Date: 03/12/2017 OT Individual Time: 1000-1100 OT Individual Time Calculation (min): 60 min    Short Term Goals: Week 1:  OT Short Term Goal 1 (Week 1): Pt will transfer to toilet with supervision OT Short Term Goal 2 (Week 1): Pt will perform LB clothing management (up and down) with steadying A OT Short Term Goal 3 (Week 1): Pt will thread LB clothing with min A  OT Short Term Goal 4 (Week 1): Pt visually attend to the right with minimal questioning cues.  Skilled Therapeutic Interventions/Progress Updates:    Pt resting in bed upon arrival and agreeable to therapy.  OT intervention with focus on BADL retraining and functional amb with RW in room.  Pt amb with RW in room (steady A) to select clothing prior to amb into bathroom for shower.  Pt completed shower with sit<>stand from tub bench.  Pt consistently dropped objects from her RUE and required assistance with removing cap from body wash.  Pt exhibited difficulty fastening her bra but was able to accurately complete task with extra time.  Pt required assistance with threading underpants/pants and donning socks.  Pt required more than a reasonable amount of time to complete BADLs and was notably fatigued.  Pt reported that she was fatigued.  Pt amb with RW to recliner.  Pt remained in recliner with all needs within reach.   Therapy Documentation Precautions:  Precautions Precautions: Fall Precaution Comments: mild R inattention, motor planning deficits Restrictions Weight Bearing Restrictions: No Pain: Pain Assessment Pain Assessment: No/denies pain  See Function Navigator for Current Functional Status.   Therapy/Group: Individual Therapy  Rich Brave 03/12/2017, 11:38 AM

## 2017-03-12 NOTE — Progress Notes (Signed)
[place] PHYSICAL MEDICINE & REHABILITATION     PROGRESS NOTE  Subjective/Complaints:  Pt seen laying in bed this AM.  She slept well overnight.  She asks to be readjusted in bed so she can eat breakfast.  She has questions about her discharge date.   ROS: Denies CP, SOB, N/V/D.  Objective: Vital Signs: Blood pressure 130/74, pulse 65, temperature 98.2 F (36.8 C), temperature source Oral, resp. rate 18, height 5\' 6"  (1.676 m), weight 101.9 kg (224 lb 10.4 oz), SpO2 100 %. No results found. Recent Labs    03/11/17 0607  WBC 10.2  HGB 12.9  HCT 39.3  PLT 240   Recent Labs    03/10/17 0728  NA 139  K 3.6  CL 110  GLUCOSE 107*  BUN 14  CREATININE 0.97  CALCIUM 8.7*   CBG (last 3)  No results for input(s): GLUCAP in the last 72 hours.  Wt Readings from Last 3 Encounters:  03/06/17 101.9 kg (224 lb 10.4 oz)  03/03/17 100.7 kg (222 lb 0.1 oz)  02/20/17 102.9 kg (226 lb 12.8 oz)    Physical Exam:  BP 130/74   Pulse 65   Temp 98.2 F (36.8 C) (Oral)   Resp 18   Ht 5\' 6"  (1.676 m)   Wt 101.9 kg (224 lb 10.4 oz)   SpO2 100%   BMI 36.26 kg/m  Constitutional: She appears well-developed and well-nourished. No distress.  HENT: Normocephalic and atraumatic.  Eyes: EOM are normal. No discharge.  Cardiovascular: RRR. No JVD. Respiratory: Effort normal and breath sounds normal.  GI: Bowel sounds are normal. She exhibits no distension.  Musculoskeletal: She exhibits no edema or tenderness.  Neurological: She is alert and oriented.  Motor: 4+-5/5 throughout  Expressive >Receptive aphasia (continues to improve) Skin: Skin is warm and dry. No rash noted. She is not diaphoretic.  Psychiatric: She has a normal mood and affect. Her behavior is normal.    Assessment/Plan: 1. 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 Physiatrist is providing  close team supervision and 24 hour management of active medical problems listed below. Physiatrist and rehab team continue to assess barriers to discharge/monitor patient progress toward functional and medical goals.  Function:  Bathing Bathing position   Position: Shower  Bathing parts Body parts bathed by patient: Right arm, Left arm, Chest, Abdomen, Front perineal area, Right upper leg, Left upper leg, Right lower leg, Left lower leg, Buttocks Body parts bathed by helper: Back  Bathing assist Assist Level: Touching or steadying assistance(Pt > 75%)      Upper Body Dressing/Undressing Upper body dressing   What is the patient wearing?: Bra, Pull over shirt/dress Bra - Perfomed by patient: Thread/unthread right bra strap, Thread/unthread left bra strap Bra - Perfomed by helper: Hook/unhook bra (pull down sports bra) Pull over shirt/dress - Perfomed by patient: Thread/unthread left sleeve, Put head through opening, Pull shirt over trunk, Thread/unthread right sleeve Pull over shirt/dress - Perfomed by helper: Thread/unthread right sleeve        Upper body assist Assist Level: Touching or steadying assistance(Pt > 75%)      Lower Body Dressing/Undressing Lower body dressing   What is the patient wearing?: Pants, Non-skid slipper socks, Underwear Underwear - Performed by patient: Thread/unthread left underwear leg, Pull underwear up/down Underwear - Performed by helper: Thread/unthread right underwear leg Pants- Performed by patient: Thread/unthread right pants  leg, Thread/unthread left pants leg, Pull pants up/down Pants- Performed by helper: Thread/unthread left pants leg   Non-skid slipper socks- Performed by helper: Don/doff right sock, Don/doff left sock       Shoes - Performed by helper: Don/doff right shoe, Don/doff left shoe       TED Hose - Performed by helper: Don/doff right TED hose, Don/doff left TED hose  Lower body assist Assist for lower body dressing: Touching  or steadying assistance (Pt > 75%)      Toileting Toileting   Toileting steps completed by patient: Adjust clothing prior to toileting, Performs perineal hygiene, Adjust clothing after toileting Toileting steps completed by helper: Adjust clothing after toileting Toileting Assistive Devices: Grab bar or rail  Toileting assist Assist level: Touching or steadying assistance (Pt.75%)   Transfers Chair/bed transfer   Chair/bed transfer method: Ambulatory Chair/bed transfer assist level: Touching or steadying assistance (Pt > 75%) Chair/bed transfer assistive device: Armrests, Patent attorney     Max distance: 150 Assist level: Touching or steadying assistance (Pt > 75%)   Wheelchair   Type: Manual Max wheelchair distance: 125 Assist Level: Touching or steadying assistance (Pt > 75%)  Cognition Comprehension Comprehension assist level: Follows basic conversation/direction with extra time/assistive device  Expression Expression assist level: Expresses basic 90% of the time/requires cueing < 10% of the time.  Social Interaction Social Interaction assist level: Interacts appropriately with others with medication or extra time (anti-anxiety, antidepressant).  Problem Solving Problem solving assist level: Solves basic 75 - 89% of the time/requires cueing 10 - 24% of the time  Memory Memory assist level: Recognizes or recalls 75 - 89% of the time/requires cueing 10 - 24% of the time    Medical Problem List and Plan: 1.  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   Cont CIR   Team conference today 2.  H/oDVT/Anticoagulation: Pharmaceutical: Other (comment)--Eliquis 3. Pain Management: tylenol prn for back pain 4. Mood: LCSW to follow for evaluation and support.  5. Neuropsych: This patient is capable of making decisions on her own behalf. 6. Skin/Wound Care: routine pressure relief  measures 7. Fluids/Electrolytes/Nutrition:  Monitor I/O. Check lytes in am.  8. HTN: Monitor BP bid. Continue Metoprolol bid and Spironolactone   Lotensin increased to 30 on 12/31   Relatively controlled on 1/2 9. CAD/ICM: Managed with BB and statin.  10 CKD stage III:    Cr. 0.97 on 12/31, improving to baseline 11. Hypothyroid: on supplement 12. Leukocytosis: Resolved   WBCs 10.2 on 1/2   Afebrile   Cont to monitor 13. Foul urine   Ucx with multiple species, reordered   LOS (Days) 6 A FACE TO FACE EVALUATION WAS PERFORMED  Vernadine Coombs Karis Juba 03/12/2017 8:20 AM

## 2017-03-13 ENCOUNTER — Inpatient Hospital Stay (HOSPITAL_COMMUNITY): Payer: Medicare HMO | Admitting: Occupational Therapy

## 2017-03-13 ENCOUNTER — Inpatient Hospital Stay (HOSPITAL_COMMUNITY): Payer: Medicare HMO

## 2017-03-13 ENCOUNTER — Inpatient Hospital Stay (HOSPITAL_COMMUNITY): Payer: Medicare HMO | Admitting: Speech Pathology

## 2017-03-13 LAB — URINE CULTURE

## 2017-03-13 NOTE — Progress Notes (Signed)
[place] PHYSICAL MEDICINE & REHABILITATION     PROGRESS NOTE  Subjective/Complaints:  Up in bed no complaints. Good appetite  ROS: pt denies nausea, vomiting, diarrhea, cough, shortness of breath or chest pain    Objective: Vital Signs: Blood pressure (!) 154/84, pulse 64, temperature 98.7 F (37.1 C), temperature source Oral, resp. rate 16, height 5\' 6"  (1.676 m), weight 101.9 kg (224 lb 10.4 oz), SpO2 98 %. No results found. Recent Labs    03/11/17 0607  WBC 10.2  HGB 12.9  HCT 39.3  PLT 240   No results for input(s): NA, K, CL, GLUCOSE, BUN, CREATININE, CALCIUM in the last 72 hours.  Invalid input(s): CO CBG (last 3)  No results for input(s): GLUCAP in the last 72 hours.  Wt Readings from Last 3 Encounters:  03/06/17 101.9 kg (224 lb 10.4 oz)  03/03/17 100.7 kg (222 lb 0.1 oz)  02/20/17 102.9 kg (226 lb 12.8 oz)    Physical Exam:  BP (!) 154/84 (BP Location: Left Arm)   Pulse 64   Temp 98.7 F (37.1 C) (Oral)   Resp 16   Ht 5\' 6"  (1.676 m)   Wt 101.9 kg (224 lb 10.4 oz)   SpO2 98%   BMI 36.26 kg/m  Constitutional: She appears well-developed and well-nourished. No distress.  HENT: Normocephalic and atraumatic.  Eyes: EOM are normal. No discharge.  Cardiovascular: RRR without murmur. No JVD . Respiratory:CTA Bilaterally without wheezes or rales. Normal effort   GI: Bowel sounds are normal. She exhibits no distension.  Musculoskeletal: She exhibits no edema or tenderness.  Neurological: She is alert and oriented.  Motor: 4+-5/5 throughout  Expressive >Receptive aphasia (continues to improve)--fluent, content good Skin: Skin is warm and dry. No rash noted. She is not diaphoretic.  Psychiatric: She has a normal mood and affect. Her behavior is normal.    Assessment/Plan: 1. Functional deficits secondary to left parietal MCA territory infarct  which require 3+ hours per day of interdisciplinary therapy in a comprehensive inpatient rehab  setting. Physiatrist is providing close team supervision and 24 hour management of active medical problems listed below. Physiatrist and rehab team continue to assess barriers to discharge/monitor patient progress toward functional and medical goals.  Function:  Bathing Bathing position   Position: Shower  Bathing parts Body parts bathed by patient: Right arm, Left arm, Chest, Abdomen, Front perineal area, Right upper leg, Left upper leg, Right lower leg, Left lower leg, Buttocks Body parts bathed by helper: Back  Bathing assist Assist Level: Touching or steadying assistance(Pt > 75%)      Upper Body Dressing/Undressing Upper body dressing   What is the patient wearing?: Bra, Pull over shirt/dress Bra - Perfomed by patient: Thread/unthread right bra strap, Thread/unthread left bra strap, Hook/unhook bra (pull down sports bra) Bra - Perfomed by helper: Hook/unhook bra (pull down sports bra) Pull over shirt/dress - Perfomed by patient: Thread/unthread left sleeve, Put head through opening, Pull shirt over trunk, Thread/unthread right sleeve Pull over shirt/dress - Perfomed by helper: Thread/unthread right sleeve        Upper body assist Assist Level: Supervision or verbal cues      Lower Body Dressing/Undressing Lower body dressing   What is the patient wearing?: Underwear, Pants, Non-skid slipper socks Underwear - Performed by patient: Thread/unthread left underwear leg, Pull underwear up/down Underwear - Performed by helper: Thread/unthread right underwear leg Pants- Performed by patient: Thread/unthread right pants leg, Pull pants up/down Pants- Performed by helper: Thread/unthread left  pants leg   Non-skid slipper socks- Performed by helper: Don/doff right sock, Don/doff left sock       Shoes - Performed by helper: Don/doff right shoe, Don/doff left shoe       TED Hose - Performed by helper: Don/doff right TED hose, Don/doff left TED hose  Lower body assist Assist for  lower body dressing: Touching or steadying assistance (Pt > 75%)      Toileting Toileting   Toileting steps completed by patient: Adjust clothing prior to toileting, Performs perineal hygiene, Adjust clothing after toileting Toileting steps completed by helper: Adjust clothing after toileting Toileting Assistive Devices: Grab bar or rail  Toileting assist Assist level: Touching or steadying assistance (Pt.75%)   Transfers Chair/bed transfer   Chair/bed transfer method: Ambulatory Chair/bed transfer assist level: Touching or steadying assistance (Pt > 75%) Chair/bed transfer assistive device: Armrests, Patent attorney     Max distance: 150 Assist level: Touching or steadying assistance (Pt > 75%)   Wheelchair   Type: Manual Max wheelchair distance: 125 Assist Level: Touching or steadying assistance (Pt > 75%)  Cognition Comprehension Comprehension assist level: Follows basic conversation/direction with extra time/assistive device  Expression Expression assist level: Expresses basic 75 - 89% of the time/requires cueing 10 - 24% of the time. Needs helper to occlude trach/needs to repeat words.  Social Interaction Social Interaction assist level: Interacts appropriately with others with medication or extra time (anti-anxiety, antidepressant).  Problem Solving Problem solving assist level: Solves basic 75 - 89% of the time/requires cueing 10 - 24% of the time  Memory Memory assist level: Recognizes or recalls 75 - 89% of the time/requires cueing 10 - 24% of the time    Medical Problem List and Plan: 1.  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   Cont CIR   ELOS 1/8 2.  H/oDVT/Anticoagulation: Pharmaceutical: Other (comment)--Eliquis 3. Pain Management: tylenol prn for back pain 4. Mood: LCSW to follow for evaluation and support.  5. Neuropsych: This patient is capable of making  decisions on her own behalf. 6. Skin/Wound Care: routine pressure relief measures 7. Fluids/Electrolytes/Nutrition:  Monitor I/O. Check lytes in am.  8. HTN: Monitor BP bid. Continue Metoprolol bid and Spironolactone   Lotensin increased to 30 on 12/31   Relatively controlled on 1/2 9. CAD/ICM: Managed with BB and statin.  10 CKD stage III:    Cr. 0.97 on 12/31, improving to baseline 11. Hypothyroid: on supplement 12. Leukocytosis: Resolved   WBCs 10.2 on 1/2   Afebrile   Cont to monitor 13. Foul urine   Ucx with multiple species, repeat ucx pending   LOS (Days) 7 A FACE TO FACE EVALUATION WAS PERFORMED  Armanii Urbanik T 03/13/2017 8:18 AM

## 2017-03-13 NOTE — Progress Notes (Signed)
Social Work Patient ID: Yvonne Bailey, female   DOB: April 28, 1940, 77 y.o.   MRN: [id number]   CSW met with pt and then spoke with her granddtr, Delana Meyer, by telephone to update them on team conference discussion.  Pt was very pleased to learn of 03-18-17 d/c date and gdtr will be ready for her to come home that day.  Rockie Neighbours will come for family education on Monday, 03-17-17.  CSW will arrange f/u therapies and any DME pt needs prior to d/c.  CSW remains available to assist as needed.

## 2017-03-13 NOTE — Progress Notes (Signed)
Speech Language Pathology Daily Session Note  Patient Details  Name: AHMARIE UN MRN: [id number] Date of Birth: 06-18-1940  Today's Date: 03/13/2017 SLP Individual Time: 1005-1100 SLP Individual Time Calculation (min): 55 min  Short Term Goals: Week 1: SLP Short Term Goal 1 (Week 1): Patient will self-monitor and correct verbal errors in 90% of opportunities with supervision verbal cues.  SLP Short Term Goal 2 (Week 1): Patient will complete divergent naming tasks with Max A multimodal cues.  SLP Short Term Goal 3 (Week 1): Patient will demonstrate written expression at the basic CVC level with Mod A multimodal cues.  SLP Short Term Goal 4 (Week 1): Patient will demonstrate functional problem solving for basic and familiar tasks with Min A multimodal cues.  SLP Short Term Goal 5 (Week 1): Patient will attend to RUE/enviornment during functional tasks with Min A verbal cues.   Skilled Therapeutic Interventions:  Pt was seen for skilled ST targeting communication goals.  SLP facilitated the session with a verbal description task targeting verbal expression, specifically word finding.  Pt was 100% accurate with supervision for responsive naming of objects from description but needed up to mod assist to describe objects.  Pt was able to copy words from a written model with min verbal cues to recognize and correct errors.  Pt was returned to room and left in bed with bed alarm set with call bell within reach.  Continue per current plan of care.    Function:  Eating Eating                 Cognition Comprehension Comprehension assist level: Follows basic conversation/direction with extra time/assistive device  Expression   Expression assist level: Expresses basic 75 - 89% of the time/requires cueing 10 - 24% of the time. Needs helper to occlude trach/needs to repeat words.  Social Interaction Social Interaction assist level: Interacts appropriately with others with medication or extra  time (anti-anxiety, antidepressant).  Problem Solving Problem solving assist level: Solves basic 90% of the time/requires cueing < 10% of the time  Memory Memory assist level: Recognizes or recalls 75 - 89% of the time/requires cueing 10 - 24% of the time    Pain Pain Assessment Pain Assessment: No/denies pain Pain Score: 0-No pain  Therapy/Group: Individual Therapy  Cianna Kasparian, Melanee Spry 03/13/2017, 12:31 PM

## 2017-03-13 NOTE — Progress Notes (Signed)
Occupational Therapy Session Note  Patient Details  Name: Yvonne Bailey MRN: [id number] Date of Birth: 08-Jul-1940  Today's Date: 03/13/2017 OT Individual Time: 1530-1601 OT Individual Time Calculation (min): 31 min    Short Term Goals: Week 1:  OT Short Term Goal 1 (Week 1): Pt will transfer to toilet with supervision OT Short Term Goal 2 (Week 1): Pt will perform LB clothing management (up and down) with steadying A OT Short Term Goal 3 (Week 1): Pt will thread LB clothing with min A  OT Short Term Goal 4 (Week 1): Pt visually attend to the right with minimal questioning cues.  Skilled Therapeutic Interventions/Progress Updates:    Pt seated in recliner chair upon entering the room this session. Pt fatigued from prior therapy session but agreeable to OT intervention. Session focused on bilateral fine motor coordination tasks. Pt seated and attempting to lace shoes and then tie them. Pt needing 17 minutes to complete the task with multiple attempts needed to make final "bow" in order to complete shoe tying. Pt required mod mutlimodal cues for tasks. Pt also engaged in folding towels with B hands needing increased time as well to complete task. Pt remaining in wheelchair at end of session with call bell and all needed items within reach.   Therapy Documentation Precautions:  Precautions Precautions: Fall Precaution Comments: mild R inattention, motor planning deficits Restrictions Weight Bearing Restrictions: No General:   Vital Signs: Therapy Vitals Temp: 98.6 F (37 C) Temp Source: Oral Pulse Rate: 65 Resp: 16 BP: 139/62 Patient Position (if appropriate): Lying Oxygen Therapy SpO2: 97 % O2 Device: Not Delivered Pain:   ADL: ADL ADL Comments: see functional navigator  See Function Navigator for Current Functional Status.   Therapy/Group: Individual Therapy    Alen Bleacher 03/13/2017, 4:50 PM

## 2017-03-13 NOTE — Patient Care Conference (Signed)
Inpatient RehabilitationTeam Conference and Plan of Care Update Date: 03/12/2017   Time: 12:30 PM    Patient Name: Yvonne Bailey      Medical Record Number: [id number]  Date of Birth: 09/09/1940 Sex: Female         Room/Bed: 4M06C/4M06C-01 Payor Info: Payor: [employer] MEDICARE / Plan: [employer] MEDICARE HMO / Product Type: *No Product type* /    Admitting Diagnosis: L CVA  Admit Date/Time:  03/06/2017  6:28 PM Admission Comments: No comment available   Primary Diagnosis:  <principal problem not specified> Principal Problem: <principal problem not specified>  Patient Active Problem List   Diagnosis Date Noted  . Foul smelling urine   . Leukocytosis   . Global aphasia   . Benign essential HTN   . Acute ischemic cerebrovascular accident (CVA) involving left middle cerebral artery territory [facility])   . Acute ischemic left MCA stroke (HCC) 03/06/2017  . Expressive aphasia   . Coronary artery disease involving native coronary artery without angina pectoris   . Stage 3 chronic kidney disease (HCC)   . Medically noncompliant   . Stroke (cerebrum) (HCC) 03/02/2017  . Prediabetes 07/28/2016  . DOE (dyspnea on exertion) 07/09/2016  . Gait instability 07/09/2016  . Itching 04/16/2016  . Unstable angina (HCC) 09/06/2015  . Cardiomyopathy, ischemic   . Chronic systolic heart failure (HCC) 08/21/2015  . Bilateral leg edema 07/19/2015  . Atrial fibrillation (HCC) 07/19/2015  . Urinary urgency 03/30/2015  . Other musculoskeletal symptoms referable to limbs(729.89) 12/17/2012  . Lacunar infarction 12/17/2012  . Small vessel disease, cerebrovascular 12/17/2012  . CAROTID BRUIT, RIGHT 08/10/2008  . Peripheral vascular disease (HCC) 06/04/2007  . Diverticulosis of large intestine 06/04/2007  . Hypothyroidism 02/24/2007  . Hyperlipidemia 02/24/2007  . Essential hypertension 02/24/2007  . DVT 01/21/2007  . Coronary atherosclerosis 10/29/2006    Expected Discharge Date: Expected Discharge  Date: 03/18/17  Team Members Present: Physician leading conference: Dr. 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  Improve BP, mobility, speech, bladder function  See above   Bowel/Bladder   Pt continent of bowel and bladder. LBM 03/11/17  Pt will manage bowel and bladder with supervison assist at discharge.   Monitor.    Swallow/Nutrition/ Hydration             ADL's   steady A overall with self care  supervision overall  ADL retraining, balance, activity tolerance, motor planning, pt/family education   Mobility   min A gait with RW, 23/56 Berg  supervision gait, mod I transfers  balance, activity tolerance   Communication   min assist   supervision   written expression, expansion of verbal expression, word finding   Safety/Cognition/ Behavioral Observations  mild impairment, min assist   supervision   right attention, task organization, safety awareness   Pain   No complaints of pain.   <3 out of 10.   Assess pain q shift and prn. Treat accordingly.    Skin   Clean, Dry, Intact.   No new breakdown.   Assess skin q shift and prn.     Rehab Goals Patient on target to meet rehab goals: Yes Rehab Goals Revised: none - pt's first conference *  See Care Plan and progress notes for long and short-term goals.     Barriers to Discharge  Current Status/Progress Possible Resolutions Date Resolved   Physician    Medical stability     See above  Therapies, optimize BP meds, urine studies       Nursing                  PT                    OT                  SLP                SW                Discharge Planning/Teaching Needs:  Pt plans to return  to her home with her granddtr, Yvonne Bailey, to provide 24/7 supervision.  Yvonne Bailey can come in for family education closer to d/c.   Team Discussion:  Pt is doing well medically and is continent of bladder and bowel.  Urine culture is being checked.  Pt is close S with ambulation and min A with OT, with overall goals at S.  Pt fatigues easily and has higher level word finding difficulties, so this with be the focus of therapy over this week prior to d/c.  Revisions to Treatment Plan:  none    Continued Need for Acute Rehabilitation Level of Care: The patient requires daily medical management by a physician with specialized training in physical medicine and rehabilitation for the following conditions: Daily direction of a multidisciplinary physical rehabilitation program to ensure safe treatment while eliciting the highest outcome that is of practical value to the patient.: Yes Daily medical management of patient stability for increased activity during participation in an intensive rehabilitation regime.: Yes Daily analysis of laboratory values and/or radiology reports with any subsequent need for medication adjustment of medical intervention for : Neurological problems;Blood pressure problems;Urological problems  Geordie Nooney, Vista Deck 03/13/2017, 1:59 PM

## 2017-03-13 NOTE — Progress Notes (Signed)
Occupational Therapy Session Note  Patient Details  Name: Yvonne Bailey MRN: [id number] Date of Birth: 1940/08/30  Today's Date: 03/13/2017 OT Individual Time: 0905-1005 OT Individual Time Calculation (min): 60 min    Short Term Goals: Week 1:  OT Short Term Goal 1 (Week 1): Pt will transfer to toilet with supervision OT Short Term Goal 2 (Week 1): Pt will perform LB clothing management (up and down) with steadying A OT Short Term Goal 3 (Week 1): Pt will thread LB clothing with min A  OT Short Term Goal 4 (Week 1): Pt visually attend to the right with minimal questioning cues.  Skilled Therapeutic Interventions/Progress Updates:    Pt seen this session to focus on dynamic balance and RUE motor planning/ coordination.  Pt received in bed dressed.  She stated that she showered yesterday and preferred to not shower today.  Pt sat to EOB with S and then used RW with close S to ambulate to sink. Discussed with pt how she had difficulty dropping things from R hand yesterday and to focus on her R hand today. Pt needed a few cues, but was able to open and apply toothpaste, brush her teeth with S in standing.  Good standing balance at the sink.  She then ambulated to the gym to engage in [facility] of opening and closing pill bottles. She did need some cueing on how to open the various types of bottles and matching which lid went with which bottle.  She then placed small beads into a bottle to simulate pills.  She also practiced with placing small beads onto a shoe string.  It took her extra time with both activities but no motor impersistence noted today.  Pt ambulated to speech therapy room for her next session.  Therapy Documentation Precautions:  Precautions Precautions: Fall Precaution Comments: mild R inattention, motor planning deficits Restrictions Weight Bearing Restrictions: No  Pain: Pain Assessment Pain Assessment: No/denies pain Pain Score: 0-No pain ADL: ADL ADL Comments: see  functional navigator   See Function Navigator for Current Functional Status.   Therapy/Group: Individual Therapy  SAGUIER,JULIA 03/13/2017, 12:03 PM

## 2017-03-13 NOTE — Progress Notes (Addendum)
Physical Therapy Note  Patient Details  Name: Yvonne Bailey MRN: [id number] Date of Birth: 04/04/1940 Today's Date: 03/13/2017  1415-1520, 65 min individual tx Pain: none per pt  Bed mobility in flat bed no rails to sit up on EOb, supervision.  Gait trainiing in room to toilet to attempt to void without results.  Brief noted to be soiled slightly.  Pt needed mod assist to doff pants and brief while sitting on toilet and getting R foot into brief and pants, due to apraxia/motor planning problems.  Gait training on level tile and carpet with RW while transporting clothes hangers on RW.  Pt had great difficulty simulating hanging up pants on hangers, using folded pillow cases,  In kitchen, pt needed mod cues to change body position in order to read labels of items over head.  When repositioning herself, pt held onto the over door instead of the R side of her RW, without awareness until she noticed door was opening with her movement.  Standing balance activity opening fridge and freezer doors with R hand; when asked to look into drawer, she unsafely attempted to open it from too far away.  Transfer blocked training with sit>< stand x 5 iwhtout use of UEs.  Neuro re-ed via demo, multimodal cues for trunk shortening/lengthening with wt shifting in order to reciprocally scoot without use of hands.  During gait training to return to room, neuro re-ed for R knee flexion via kicking a Yoga block with R foot as she travelled.  Pt left resting in recliner with all needs within reach.  Pt needed multiple seated rest breaks throughout session due ot DOE.   PT informed Boneta Lucks, CSW of safety issues with pt today.    See function navigator for current status.  Arihana Ambrocio 03/13/2017, 8:23 AM

## 2017-03-14 ENCOUNTER — Inpatient Hospital Stay (HOSPITAL_COMMUNITY): Payer: Medicare HMO | Admitting: Occupational Therapy

## 2017-03-14 ENCOUNTER — Inpatient Hospital Stay (HOSPITAL_COMMUNITY): Payer: Medicare HMO | Admitting: Speech Pathology

## 2017-03-14 ENCOUNTER — Inpatient Hospital Stay (HOSPITAL_COMMUNITY): Payer: Medicare HMO | Admitting: Physical Therapy

## 2017-03-14 NOTE — Progress Notes (Signed)
Speech Language Pathology Daily Session Note  Patient Details  Name: Yvonne Bailey MRN: [id number] Date of Birth: 1940/11/11  Today's Date: 03/14/2017 SLP Individual Time: 1330-1400 SLP Individual Time Calculation (min): 30 min  Short Term Goals: Week 2: SLP Short Term Goal 1 (Week 2): STG=LTG due to remaining length of stay   Skilled Therapeutic Interventions: Skilled treatment session focused on communication goals. SLP facilitated session by providing Min A verbal cues to complete convergent and divergent naming tasks. Patient also required Min A verbal cues for word-finding during an informal conversation in regards to describing events from previous therapy sessions. Patient left upright in recliner with all needs within reach. Continue with current plan of care.      Function:    Cognition Comprehension Comprehension assist level: Follows basic conversation/direction with extra time/assistive device  Expression   Expression assist level: Expresses basic 90% of the time/requires cueing < 10% of the time.  Social Interaction Social Interaction assist level: Interacts appropriately with others with medication or extra time (anti-anxiety, antidepressant).  Problem Solving Problem solving assist level: Solves basic 90% of the time/requires cueing < 10% of the time  Memory Memory assist level: Recognizes or recalls 75 - 89% of the time/requires cueing 10 - 24% of the time    Pain Pain Assessment Pain Assessment: No/denies pain  Therapy/Group: Individual Therapy  Johari Pinney 03/14/2017, 3:18 PM

## 2017-03-14 NOTE — Progress Notes (Signed)
Physical Therapy Weekly Progress Note  Patient Details  Name: Yvonne Bailey MRN: [id number] Date of Birth: 06/22/1940  Beginning of progress report period: March 07, 2017 End of progress report period: March 14, 2017   Patient has met 3 of 4 short term goals.  Pt has progressed to supervision level with transfers and gait, pt continues to require mod cuing for Rt attention during functional tasks.  Patient continues to demonstrate the following deficits muscle weakness, motor apraxia, decreased coordination and decreased motor planning, decreased awareness, decreased problem solving and delayed processing and decreased standing balance and decreased balance strategies and therefore will continue to benefit from skilled PT intervention to increase functional independence with mobility.  Patient progressing toward long term goals..  Continue plan of care.  PT Short Term Goals Week 1:  PT Short Term Goal 1 (Week 1): Pt will perform bed mobility with supervision assist  PT Short Term Goal 1 - Progress (Week 1): Met PT Short Term Goal 2 (Week 1): Pt will perform sit<>stand transfers with supervision assist  PT Short Term Goal 2 - Progress (Week 1): Met PT Short Term Goal 3 (Week 1): Pt will ambulate 187f with min assist consistently and LRAD  PT Short Term Goal 3 - Progress (Week 1): Met PT Short Term Goal 4 (Week 1): Pt will demonstrate improved attention to the R side with improved visual scanning 50% time and min cues  PT Short Term Goal 4 - Progress (Week 1): Progressing toward goal Week 2:  PT Short Term Goal 1 (Week 2): =LTG  Skilled Therapeutic Interventions/Progress Updates:  Ambulation/gait training;Balance/vestibular training;Cognitive remediation/compensation;Community reintegration;Disease management/prevention;Discharge planning;DME/adaptive equipment instruction;Functional electrical stimulation;Functional mobility training;Neuromuscular re-education;Pain  management;Patient/family education;Psychosocial support;Skin care/wound management;Splinting/orthotics;Stair training;Therapeutic Activities;Therapeutic Exercise;Visual/perceptual remediation/compensation;UE/LE Coordination activities;UE/LE Strength taining/ROM;Wheelchair propulsion/positioning     See Function Navigator for Current Functional Status.   DONAWERTH,KAREN 03/14/2017, 8:36 AM

## 2017-03-14 NOTE — Progress Notes (Signed)
Physical Therapy Note  Patient Details  Name: ARICELA TORREALBA MRN: [id number] Date of Birth: 02/23/1941 Today's Date: 03/14/2017    Time: 1005-1100 55 minutes  1:1 No c/o pain.  Pt fatigued from previous OT session but agreeable to participate with frequent rest breaks.  gait throughout unit in home and controlled environments with RW with supervision.  Stair negotiation with 1 handrail x 4 steps with min A.  Standing balance reaching and squatting activity with card matching task with pt able to perform squat and reaching with close supervision/min guard, 100% accuracy of card matching.  [place] A exercises performed with mod cuing for following directions and recall during task, supervision for balance.  Pt attempted to put pillow cases on pillow x 2 attempts with mod/max cuing each time for technique and sequencing.  Pt continues with decreased awareness of Rt side and decreased awareness of deficits.   Deontez Klinke 03/14/2017, 11:42 AM

## 2017-03-14 NOTE — Progress Notes (Signed)
Occupational Therapy Weekly Progress Note  Patient Details  Name: Yvonne Bailey MRN: [id number] Date of Birth: January 31, 1941  Beginning of progress report period: March 07, 2017 End of progress report period: March 14, 2016  Today's Date: 03/14/2017 OT Individual Time: 0900-1000 OT Individual Time Calculation (min): 60 min    Patient has met 4 of 4 short term goals.  Pt has made excellent progress with her balance and RUE coordination. She is developing stronger awareness of her R side when integrating in bimanual tasks but continues to need questioning cues.  She is ambulating well  With her RW but will occasionally run into R side of doorway with walker.  She no longer is dropping things out of her R hand or forgetting about her arm, but has difficulty with spatial relations during dressing tasks.   Patient continues to demonstrate the following deficits: motor apraxia, decreased attention to right, decreased awareness, decreased problem solving and delayed processing and decreased standing balance and therefore will continue to benefit from skilled OT intervention to enhance overall performance with BADL.  Patient progressing toward long term goals..  Continue plan of care.  OT Short Term Goals Week 1:  OT Short Term Goal 1 (Week 1): Pt will transfer to toilet with supervision OT Short Term Goal 1 - Progress (Week 1): Met OT Short Term Goal 2 (Week 1): Pt will perform LB clothing management (up and down) with steadying A OT Short Term Goal 2 - Progress (Week 1): Met OT Short Term Goal 3 (Week 1): Pt will thread LB clothing with min A  OT Short Term Goal 3 - Progress (Week 1): Met OT Short Term Goal 4 (Week 1): Pt visually attend to the right with minimal questioning cues. OT Short Term Goal 4 - Progress (Week 1): Met Week 2:  OT Short Term Goal 1 (Week 2): STGs = LTGs  Skilled Therapeutic Interventions/Progress Updates:    Pt seen for BADL training of toileting, shower, dressing  with a focus on processing/ problem solving skills and R side (body/environment) awareness in relation to motor planning with dressing.  Pt received in bed and sat to EOB with S.  Used RW to ambulate to dresser to retrieve clothing with S and min A to find articles of clothing.  Pt ambulated to bathroom with RW with S and toileted.  In shower, good use of RUE to wash L side of body.  As she was leaving the bathroom, the walker was hitting the doorway and pt was not aware.  She needed increased cues with dressing as she was having difficulty orienting the clothing as she was trying to put her arm in the neck hole.  Overall, pt is slow to initiate and often waits for a cue.  Talked to pt about working on this skill by taking the next step of the task without a cue and therapist would intervene if she needed A.  Pt seemed to understand but her overall processing skills are slow.  She is very cooperative and eager to do well in the therapy sessions. She is on track for discharging to home at a S level.  Pt resting in bed at end of session with bed alarm on.  Therapy Documentation Precautions:  Precautions Precautions: Fall Precaution Comments: mild R inattention, motor planning deficits Restrictions Weight Bearing Restrictions: No  Pain: Pain Assessment Pain Assessment: No/denies pain ADL: ADL ADL Comments: see functional navigator   See Function Navigator for Current Functional Status.  Therapy/Group: Individual Therapy  [place] 03/14/2017, 12:37 PM

## 2017-03-14 NOTE — Progress Notes (Signed)
[place] [facility] PHYSICAL MEDICINE & REHABILITATION     PROGRESS NOTE  Subjective/Complaints:  Patient up at sink brushing her teeth.  No new issues this morning.  ROS: pt denies nausea, vomiting, diarrhea, cough, shortness of breath or chest pain    Objective: Vital Signs: Blood pressure (!) 157/90, pulse 63, temperature 98.6 F (37 C), temperature source Oral, resp. rate 16, height 5\' 6"  (1.676 m), weight 101.9 kg (224 lb 10.4 oz), SpO2 100 %. No results found. No results for input(s): WBC, HGB, HCT, PLT in the last 72 hours. No results for input(s): NA, K, CL, GLUCOSE, BUN, CREATININE, CALCIUM in the last 72 hours.  Invalid input(s): CO CBG (last 3)  No results for input(s): GLUCAP in the last 72 hours.  Wt Readings from Last 3 Encounters:  03/06/17 101.9 kg (224 lb 10.4 oz)  03/03/17 100.7 kg (222 lb 0.1 oz)  02/20/17 102.9 kg (226 lb 12.8 oz)    Physical Exam:  BP (!) 157/90 (BP Location: Right Arm)   Pulse 63   Temp 98.6 F (37 C) (Oral)   Resp 16   Ht 5\' 6"  (1.676 m)   Wt 101.9 kg (224 lb 10.4 oz)   SpO2 100%   BMI 36.26 kg/m  Constitutional: She appears well-developed and well-nourished. No distress.  HENT: Normocephalic and atraumatic.  Eyes: EOM are normal. No discharge.  Cardiovascular: RRR without murmur. No JVD  . Respiratory: CTA Bilaterally without wheezes or rales. Normal effort    GI: Bowel sounds are normal. She exhibits no distension.  Musculoskeletal: She exhibits no edema or tenderness.  Neurological: She is alert and oriented. mild right intattention Motor: 4+-5/5 throughout  Expressive >Receptive aphasia (continues to improve)--fluent, content good still Skin: Skin is warm and dry. No rash noted. She is not diaphoretic.  Psychiatric: She has a normal mood and affect. Her behavior is normal.    Assessment/Plan: 1. 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 Physiatrist is providing close team supervision and 24 hour management of active medical problems listed below. Physiatrist and rehab team continue to assess barriers to discharge/monitor patient progress toward functional and medical goals.  Function:  Bathing Bathing position   Position: Shower  Bathing parts Body parts bathed by patient: Right arm, Left arm, Chest, Abdomen, Front perineal area, Right upper leg, Left upper leg, Right lower leg, Left lower leg, Buttocks Body parts bathed by helper: Back  Bathing assist Assist Level: Touching or steadying assistance(Pt > 75%)      Upper Body Dressing/Undressing Upper body dressing   What is the patient wearing?: Bra, Pull over shirt/dress Bra - Perfomed by patient: Thread/unthread right bra strap, Thread/unthread left bra strap, Hook/unhook bra (pull down sports bra) Bra - Perfomed by helper: Hook/unhook bra (pull down sports bra) Pull over shirt/dress - Perfomed by patient: Thread/unthread left sleeve, Put head through opening, Pull shirt over trunk, Thread/unthread right sleeve Pull over shirt/dress - Perfomed by helper: Thread/unthread right sleeve        Upper body assist Assist Level: Supervision or verbal cues      Lower Body Dressing/Undressing Lower body dressing   What is the patient wearing?: Underwear, Pants, Non-skid slipper socks Underwear - Performed by patient: Thread/unthread left underwear leg, Pull underwear up/down Underwear - Performed by helper: Thread/unthread right underwear leg Pants- Performed by patient: Thread/unthread right pants leg, Pull pants up/down Pants- Performed  by helper: Thread/unthread left pants leg   Non-skid slipper socks- Performed by helper: Don/doff right sock, Don/doff left sock       Shoes - Performed by helper: Don/doff right shoe, Don/doff left shoe       TED Hose - Performed by helper: Don/doff right TED hose, Don/doff left TED hose   Lower body assist Assist for lower body dressing: Touching or steadying assistance (Pt > 75%)      Toileting Toileting   Toileting steps completed by patient: Adjust clothing prior to toileting, Performs perineal hygiene, Adjust clothing after toileting Toileting steps completed by helper: Adjust clothing after toileting Toileting Assistive Devices: Grab bar or rail  Toileting assist Assist level: Supervision or verbal cues   Transfers Chair/bed transfer   Chair/bed transfer method: Ambulatory Chair/bed transfer assist level: Supervision or verbal cues Chair/bed transfer assistive device: Patent attorney     Max distance: 100 Assist level: Supervision or verbal cues   Wheelchair   Type: Manual Max wheelchair distance: 125 Assist Level: Touching or steadying assistance (Pt > 75%)  Cognition Comprehension Comprehension assist level: Follows basic conversation/direction with extra time/assistive device  Expression Expression assist level: Expresses basic 75 - 89% of the time/requires cueing 10 - 24% of the time. Needs helper to occlude trach/needs to repeat words.  Social Interaction Social Interaction assist level: Interacts appropriately with others with medication or extra time (anti-anxiety, antidepressant).  Problem Solving Problem solving assist level: Solves basic 90% of the time/requires cueing < 10% of the time  Memory Memory assist level: Recognizes or recalls 75 - 89% of the time/requires cueing 10 - 24% of the time    Medical Problem List and Plan: 1.  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   Cont CIR   ELOS 1/8 2.  H/oDVT/Anticoagulation: Pharmaceutical: Other (comment)--Eliquis 3. Pain Management: tylenol prn for back pain 4. Mood: LCSW to follow for evaluation and support.  5. Neuropsych: This patient is capable of making decisions on her own behalf. 6.  Skin/Wound Care: routine pressure relief measures 7. Fluids/Electrolytes/Nutrition:  Monitor I/O. Check lytes in am.  8. HTN: Monitor BP bid. Continue Metoprolol bid and Spironolactone   Lotensin increased to 30 on 12/31   Relatively controlled except for this AM number---continue to observe 9. CAD/ICM: Managed with BB and statin.  10 CKD stage III:    Cr. 0.97 on 12/31, improving to baseline 11. Hypothyroid: on supplement 12. Leukocytosis: Resolved   WBCs 10.2 on 1/2   Afebrile   Cont to monitor 13. Foul smelling urine   Ucx with multiple species, f/u ucx again with multi-species   LOS (Days) 8 A FACE TO FACE EVALUATION WAS PERFORMED  Kamali Sakata T 03/14/2017 8:45 AM

## 2017-03-14 NOTE — Progress Notes (Signed)
Speech Language Pathology Weekly Progress and Session Note  Patient Details  Name: Yvonne Bailey MRN: [id number] Date of Birth: [birth year]  Beginning of progress report period:  March 07, 2017   End of progress report period:  March 14, 2017  Today's Date: 03/14/2017 SLP Individual Time: 0804-0900 SLP Individual Time Calculation (min): 56 min  Short Term Goals: Week 1: SLP Short Term Goal 1 (Week 1): Patient will self-monitor and correct verbal errors in 90% of opportunities with supervision verbal cues.  SLP Short Term Goal 1 - Progress (Week 1): Progressing toward goal SLP Short Term Goal 2 (Week 1): Patient will complete divergent naming tasks with Max A multimodal cues.  SLP Short Term Goal 2 - Progress (Week 1): Met SLP Short Term Goal 3 (Week 1): Patient will demonstrate written expression at the basic CVC level with Mod A multimodal cues.  SLP Short Term Goal 3 - Progress (Week 1): Met SLP Short Term Goal 4 (Week 1): Patient will demonstrate functional problem solving for basic and familiar tasks with Min A multimodal cues.  SLP Short Term Goal 4 - Progress (Week 1): Met SLP Short Term Goal 5 (Week 1): Patient will attend to RUE/enviornment during functional tasks with Min A verbal cues.  SLP Short Term Goal 5 - Progress (Week 1): Met    New Short Term Goals: Week 2: SLP Short Term Goal 1 (Week 2): STG=LTG due to remaining length of stay   Weekly Progress Updates:  Pt has made functional gains this reporting period and has met 4 out of 5 short term goals.  Pt can be supervision for verbal expression during unstructured conversations but needs up to min-mod assist for word finding during more structured verbal tasks.   Pt is overall min assist-supervision for semi-complex cognitive tasks due to decreased safety awareness secondary to right inattention.  As a result, pt would continue to benefit from skilled ST while inpatient in order to maximize functional independence and  reduce burden of care prior to discharge.  Family education is scheduled for next week.  Anticipate that pt would benefit from ST follow up at next level of care.     Intensity: Minumum of 1-2 x/day, 30 to 90 minutes Frequency: 3 to 5 out of 7 days Duration/Length of Stay: 10-12 days  Treatment/Interventions: Cognitive remediation/compensation;Environmental controls;Internal/external aids;Speech/Language facilitation;Therapeutic Activities;Patient/family education;Functional tasks;Cueing hierarchy   Daily Session  Skilled Therapeutic Interventions:  Pt was seen for skilled ST targeting cognitive-linguistic goals.  Pt got out of bed, brushed her teeth, combed her hair, and ambulated to ST treatment room with intermittent min assist verbal cues to attend to and locate items and obstacles on her right side.   Pt needed min-mod assist verbal cues for word finding during divergent naming tasks.  She demonstrated improved written expression at the word level with min assist verbal cues to recognize and correct letter formation errors.  Pt was returned to room and left in bed with call bell within reach.  Goals updated on this date to reflect current progress and plan of care.       Function:   Eating Eating                 Cognition Comprehension Comprehension assist level: Follows basic conversation/direction with extra time/assistive device  Expression   Expression assist level: Expresses basic 90% of the time/requires cueing < 10% of the time.  Social Interaction Social Interaction assist level: Interacts appropriately with others with medication or extra  time (anti-anxiety, antidepressant).  Problem Solving Problem solving assist level: Solves basic 90% of the time/requires cueing < 10% of the time  Memory Memory assist level: Recognizes or recalls 75 - 89% of the time/requires cueing 10 - 24% of the time   General    Pain Pain Assessment Pain Assessment: No/denies  pain  Therapy/Group: Individual Therapy  , Selinda Orion 03/14/2017, 2:34 PM

## 2017-03-15 ENCOUNTER — Inpatient Hospital Stay (HOSPITAL_COMMUNITY): Payer: Medicare HMO | Admitting: Physical Therapy

## 2017-03-15 NOTE — Progress Notes (Signed)
Physical Therapy Session Note  Patient Details  Name: Yvonne Bailey MRN: [id number] Date of Birth: April 16, 1940  Today's Date: 03/15/2017 PT Individual Time: 1420-1500 PT Individual Time Calculation (min): 40 min   Short Term Goals: Week 1:  PT Short Term Goal 1 (Week 1): Pt will perform bed mobility with supervision assist  PT Short Term Goal 1 - Progress (Week 1): Met PT Short Term Goal 2 (Week 1): Pt will perform sit<>stand transfers with supervision assist  PT Short Term Goal 2 - Progress (Week 1): Met PT Short Term Goal 3 (Week 1): Pt will ambulate 130f with min assist consistently and LRAD  PT Short Term Goal 3 - Progress (Week 1): Met PT Short Term Goal 4 (Week 1): Pt will demonstrate improved attention to the R side with improved visual scanning 50% time and min cues  PT Short Term Goal 4 - Progress (Week 1): Progressing toward goal Week 2:  PT Short Term Goal 1 (Week 2): =LTG  Skilled Therapeutic Interventions/Progress Updates:   Pt received supine in bed and agreeable to PT. Supine>sit transfer with supervision assist and min cues for safety.   PT treatment focused on endurance and gait training with various AD's on various surfacesx [id number] Pt isntructed in gait training with RW x [id number] rehab unit over level and unlevel surface including up/down [id number]. Gait training without AD x [id number] close supervsion assist; pt noted to have wide BOS and increased trunk lean L and R to improve step height. PT also instructed gait training with SPC 200ft [id number] assist, min cues for AD management when turning through doorway..  Pt returned to room and performed ambulatory transfer to bed with SPC andJackson Northpervision assist. Sit>supine completed with supervision assist, and pt left supine in bed with call bell in reach and all needs met.         Therapy Documentation Precautions:  Precautions Precautions: Fall Precaution Comments: mild R inattention,  motor planning deficits Restrictions Weight Bearing Restrictions: No  Vital Signs: Therapy Vitals Temp: 98.3 F (36.8 C) Temp Source: Oral Pulse Rate: 65 Resp: 18 BP: (!) 125/43 Patient Position (if appropriate): Lying Oxygen Therapy SpO2: 99 % O2 Device: Not Delivered Pain: 0/10 See Function Navigator for Current Functional Status.   Therapy/Group: Individual Therapy  [name] [name]19, 4:23 PM

## 2017-03-15 NOTE — Progress Notes (Signed)
[place] PHYSICAL MEDICINE & REHABILITATION     PROGRESS NOTE  Subjective/Complaints:   Still with receptive > ext aphasia  ROS: pt denies nausea, vomiting, diarrhea, cough, shortness of breath or chest pain    Objective: Vital Signs: Blood pressure (!) 155/77, pulse 62, temperature 98.1 F (36.7 C), temperature source Oral, resp. rate 18, height 5\' 6"  (1.676 m), weight 101.9 kg (224 lb 10.4 oz), SpO2 99 %. No results found. No results for input(s): WBC, HGB, HCT, PLT in the last 72 hours. No results for input(s): NA, K, CL, GLUCOSE, BUN, CREATININE, CALCIUM in the last 72 hours.  Invalid input(s): CO CBG (last 3)  No results for input(s): GLUCAP in the last 72 hours.  Wt Readings from Last 3 Encounters:  03/06/17 101.9 kg (224 lb 10.4 oz)  03/03/17 100.7 kg (222 lb 0.1 oz)  02/20/17 102.9 kg (226 lb 12.8 oz)    Physical Exam:  BP (!) 155/77 (BP Location: Left Arm)   Pulse 62   Temp 98.1 F (36.7 C) (Oral)   Resp 18   Ht 5\' 6"  (1.676 m)   Wt 101.9 kg (224 lb 10.4 oz)   SpO2 99%   BMI 36.26 kg/m  Constitutional: She appears well-developed and well-nourished. No distress.  HENT: Normocephalic and atraumatic.  Eyes: EOM are normal. No discharge.  Cardiovascular: RRR without murmur. No JVD  . Respiratory: CTA Bilaterally without wheezes or rales. Normal effort    GI: Bowel sounds are normal. She exhibits no distension.  Musculoskeletal: She exhibits no edema or tenderness.  Neurological: She is alert and oriented. mild right intattention Motor: 4+-5/5 throughout Needs gestural cues to understand MMT Naming deficits noted during sensary exam Expressive >Receptive aphasia (continues to improve)--fluent, content good still Skin: Skin is warm and dry. No rash noted. She is not diaphoretic.  Psychiatric: She has a normal mood and affect. Her behavior is normal.    Assessment/Plan: 1. 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 Physiatrist is providing close team supervision and 24 hour management of active medical problems listed below. Physiatrist and rehab team continue to assess barriers to discharge/monitor patient progress toward functional and medical goals.  Function:  Bathing Bathing position   Position: Shower  Bathing parts Body parts bathed by patient: Right arm, Left arm, Chest, Abdomen, Front perineal area, Right upper leg, Left upper leg, Right lower leg, Left lower leg, Buttocks Body parts bathed by helper: Back  Bathing assist Assist Level: Supervision or verbal cues      Upper Body Dressing/Undressing Upper body dressing   What is the patient wearing?: Bra, Pull over shirt/dress Bra - Perfomed by patient: Thread/unthread right bra strap, Thread/unthread left bra strap Bra - Perfomed by helper: Hook/unhook bra (pull down sports bra) Pull over shirt/dress - Perfomed by patient: Thread/unthread left sleeve, Put head through opening, Pull shirt over trunk, Thread/unthread right sleeve Pull over shirt/dress - Perfomed by helper: Thread/unthread right sleeve        Upper body assist Assist Level: Supervision or verbal cues      Lower Body Dressing/Undressing Lower body dressing   What is the patient wearing?: Underwear, Pants, Non-skid slipper socks Underwear - Performed by patient: Thread/unthread left underwear leg, Pull underwear up/down Underwear - Performed by helper: Thread/unthread right underwear leg Pants- Performed by patient: Pull pants up/down, Thread/unthread right pants leg, Thread/unthread left pants leg Pants-  Performed by helper: Thread/unthread left pants leg Non-skid slipper socks- Performed by patient: Don/doff right sock, Don/doff left sock Non-skid slipper socks- Performed by helper: Don/doff right sock, Don/doff left sock       Shoes - Performed by helper: Don/doff right shoe,  Don/doff left shoe       TED Hose - Performed by helper: Don/doff right TED hose, Don/doff left TED hose  Lower body assist Assist for lower body dressing: Touching or steadying assistance (Pt > 75%)      Toileting Toileting   Toileting steps completed by patient: Adjust clothing prior to toileting, Performs perineal hygiene, Adjust clothing after toileting Toileting steps completed by helper: Adjust clothing after toileting Toileting Assistive Devices: Grab bar or rail  Toileting assist Assist level: Supervision or verbal cues   Transfers Chair/bed transfer   Chair/bed transfer method: Ambulatory Chair/bed transfer assist level: Supervision or verbal cues Chair/bed transfer assistive device: Patent attorney     Max distance: 100 Assist level: Supervision or verbal cues   Wheelchair   Type: Manual Max wheelchair distance: 125 Assist Level: Touching or steadying assistance (Pt > 75%)  Cognition Comprehension Comprehension assist level: Follows basic conversation/direction with extra time/assistive device  Expression Expression assist level: Expresses basic 90% of the time/requires cueing < 10% of the time.  Social Interaction Social Interaction assist level: Interacts appropriately with others with medication or extra time (anti-anxiety, antidepressant).  Problem Solving Problem solving assist level: Solves basic 90% of the time/requires cueing < 10% of the time  Memory Memory assist level: Recognizes or recalls 75 - 89% of the time/requires cueing 10 - 24% of the time    Medical Problem List and Plan: 1.  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   Cont CIR PT, OT SLP   ELOS 1/8 2.  H/oDVT/Anticoagulation: Pharmaceutical: Other (comment)--Eliquis 3. Pain Management: tylenol prn for back pain 4. Mood: LCSW to follow for evaluation and support.  5. Neuropsych: This  patient is capable of making decisions on her own behalf. 6. Skin/Wound Care: routine pressure relief measures 7. Fluids/Electrolytes/Nutrition:  Monitor I/O. Check lytes in am.  8. HTN: Monitor BP bid. Continue Metoprolol bid and Spironolactone   Lotensin increased to 30 on 12/31    Vitals:   03/14/17 2309 03/15/17 0552  BP: (!) 158/72 (!) 155/77  Pulse: 71 62  Resp:  18  Temp:  98.1 F (36.7 C)  SpO2:  99%   9. CAD/ICM: Managed with BB and statin.  10 CKD stage III:    Cr. 0.97 on 12/31, improving to baseline 11. Hypothyroid: on supplement 12. Leukocytosis: Resolved   WBCs 10.2 on 1/2   Afebrile   Cont to monitor 13. Foul smelling urine   Ucx with multiple species, f/u ucx again with multi-species- afebrile, monitor   LOS (Days) 9 A FACE TO FACE EVALUATION WAS PERFORMED  Erick Colace 03/15/2017 7:20 AM

## 2017-03-16 NOTE — Progress Notes (Signed)
[place] PHYSICAL MEDICINE & REHABILITATION     PROGRESS NOTE  Subjective/Complaints:   No issues overnite aware of upcoming d/c  ROS: pt denies nausea, vomiting, diarrhea, cough, shortness of breath or chest pain    Objective: Vital Signs: Blood pressure (!) 147/53, pulse 60, temperature 98 F (36.7 C), temperature source Oral, resp. rate 16, height 5\' 6"  (1.676 m), weight 101.9 kg (224 lb 10.4 oz), SpO2 98 %. No results found. No results for input(s): WBC, HGB, HCT, PLT in the last 72 hours. No results for input(s): NA, K, CL, GLUCOSE, BUN, CREATININE, CALCIUM in the last 72 hours.  Invalid input(s): CO CBG (last 3)  No results for input(s): GLUCAP in the last 72 hours.  Wt Readings from Last 3 Encounters:  03/06/17 101.9 kg (224 lb 10.4 oz)  03/03/17 100.7 kg (222 lb 0.1 oz)  02/20/17 102.9 kg (226 lb 12.8 oz)    Physical Exam:  BP (!) 147/53 (BP Location: Right Arm) Comment: RN notified  Pulse 60   Temp 98 F (36.7 C) (Oral)   Resp 16   Ht 5\' 6"  (1.676 m)   Wt 101.9 kg (224 lb 10.4 oz)   SpO2 98%   BMI 36.26 kg/m  Constitutional: She appears well-developed and well-nourished. No distress.  HENT: Normocephalic and atraumatic.  Eyes: EOM are normal. No discharge.  Cardiovascular: RRR without murmur. No JVD  . Respiratory: CTA Bilaterally without wheezes or rales. Normal effort    GI: Bowel sounds are normal. She exhibits no distension.  Musculoskeletal: She exhibits no edema or tenderness.  Neurological: She is alert and oriented. mild right intattention Motor: 4+-5/5 throughout Needs gestural cues to understand MMT Naming deficits noted during sensary exam Expressive >Receptive aphasia (continues to improve)--fluent, content good still Skin: Skin is warm and dry. No rash noted. She is not diaphoretic.  Psychiatric: She has a normal mood and affect. Her behavior is normal.    Assessment/Plan: 1. Functional deficits secondary to left parietal MCA territory  infarct  which require 3+ hours per day of interdisciplinary therapy in a comprehensive inpatient rehab setting. Physiatrist is providing close team supervision and 24 hour management of active medical problems listed below. Physiatrist and rehab team continue to assess barriers to discharge/monitor patient progress toward functional and medical goals.  Function:  Bathing Bathing position   Position: Shower  Bathing parts Body parts bathed by patient: Right arm, Left arm, Chest, Abdomen, Front perineal area, Right upper leg, Left upper leg, Right lower leg, Left lower leg, Buttocks Body parts bathed by helper: Back  Bathing assist Assist Level: Supervision or verbal cues      Upper Body Dressing/Undressing Upper body dressing   What is the patient wearing?: Bra, Pull over shirt/dress Bra - Perfomed by patient: Thread/unthread right bra strap, Thread/unthread left bra strap Bra - Perfomed by helper: Hook/unhook bra (pull down sports bra) Pull over shirt/dress - Perfomed by patient: Thread/unthread left sleeve, Put head through opening, Pull shirt over trunk, Thread/unthread right sleeve Pull over shirt/dress - Perfomed by helper: Thread/unthread right sleeve        Upper body assist Assist Level: Supervision or verbal cues      Lower Body Dressing/Undressing Lower body dressing   What is the patient wearing?: Underwear, Pants, Non-skid slipper socks Underwear - Performed by patient: Thread/unthread left underwear leg, Pull underwear up/down Underwear - Performed by helper: Thread/unthread right underwear leg Pants- Performed by patient: Pull pants up/down, Thread/unthread right pants leg, Thread/unthread left  pants leg Pants- Performed by helper: Thread/unthread left pants leg Non-skid slipper socks- Performed by patient: Don/doff right sock, Don/doff left sock Non-skid slipper socks- Performed by helper: Don/doff right sock, Don/doff left sock       Shoes - Performed by helper:  Don/doff right shoe, Don/doff left shoe       TED Hose - Performed by helper: Don/doff right TED hose, Don/doff left TED hose  Lower body assist Assist for lower body dressing: Touching or steadying assistance (Pt > 75%)      Toileting Toileting   Toileting steps completed by patient: Adjust clothing prior to toileting, Performs perineal hygiene, Adjust clothing after toileting Toileting steps completed by helper: Adjust clothing after toileting Toileting Assistive Devices: Grab bar or rail  Toileting assist Assist level: Supervision or verbal cues   Transfers Chair/bed transfer   Chair/bed transfer method: Ambulatory Chair/bed transfer assist level: Supervision or verbal cues Chair/bed transfer assistive device: Patent attorney     Max distance: 100 Assist level: Supervision or verbal cues   Wheelchair   Type: Manual Max wheelchair distance: 125 Assist Level: Touching or steadying assistance (Pt > 75%)  Cognition Comprehension Comprehension assist level: Follows basic conversation/direction with extra time/assistive device  Expression Expression assist level: Expresses basic 90% of the time/requires cueing < 10% of the time.  Social Interaction Social Interaction assist level: Interacts appropriately with others with medication or extra time (anti-anxiety, antidepressant).  Problem Solving Problem solving assist level: Solves basic 90% of the time/requires cueing < 10% of the time  Memory Memory assist level: Recognizes or recalls 75 - 89% of the time/requires cueing 10 - 24% of the time    Medical Problem List and Plan: 1.  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   Cont CIR PT, OT SLP   ELOS 1/8 2.  H/oDVT/Anticoagulation: Pharmaceutical: Other (comment)--Eliquis 3. Pain Management: tylenol prn for back pain 4. Mood: LCSW to follow for evaluation and support.  5.  Neuropsych: This patient is capable of making decisions on her own behalf. 6. Skin/Wound Care: routine pressure relief measures 7. Fluids/Electrolytes/Nutrition:  Monitor I/O. Check lytes in am.  8. HTN: Monitor BP bid. Continue Metoprolol bid and Spironolactone   Lotensin increased to 30 on 12/31    Vitals:   03/15/17 1946 03/16/17 0143  BP: (!) 141/47 (!) 147/53  Pulse: 65 60  Resp:  16  Temp:  98 F (36.7 C)  SpO2:  98%  controlled 1/6 avoid hypotension 9. CAD/ICM: Managed with BB and statin.  10 CKD stage III:    Cr. 0.97 on 12/31, improving to baseline 11. Hypothyroid: on supplement  13. Foul smelling urine   Ucx with multiple species, f/u ucx again with multi-species- afebrile, monitor   LOS (Days) 10 A FACE TO FACE EVALUATION WAS PERFORMED  Erick Colace 03/16/2017 7:58 AM

## 2017-03-17 ENCOUNTER — Inpatient Hospital Stay (HOSPITAL_COMMUNITY): Payer: Medicare HMO | Admitting: Occupational Therapy

## 2017-03-17 ENCOUNTER — Inpatient Hospital Stay (HOSPITAL_COMMUNITY): Payer: Medicare HMO | Admitting: Speech Pathology

## 2017-03-17 ENCOUNTER — Ambulatory Visit (HOSPITAL_COMMUNITY): Payer: Medicare HMO | Admitting: Physical Therapy

## 2017-03-17 ENCOUNTER — Encounter (HOSPITAL_COMMUNITY): Payer: Medicare HMO | Admitting: Psychology

## 2017-03-17 ENCOUNTER — Inpatient Hospital Stay (HOSPITAL_COMMUNITY): Payer: Medicare HMO | Admitting: Physical Therapy

## 2017-03-17 LAB — CBC WITH DIFFERENTIAL/PLATELET
Basophils Absolute: 0 10*3/uL (ref 0.0–0.1)
Basophils Relative: 0 %
Eosinophils Absolute: 0.3 10*3/uL (ref 0.0–0.7)
Eosinophils Relative: 2 %
HCT: 37.9 % (ref 36.0–46.0)
Hemoglobin: 12.4 g/dL (ref 12.0–15.0)
Lymphocytes Relative: 20 %
Lymphs Abs: 2.4 10*3/uL (ref 0.7–4.0)
MCH: 31.8 pg (ref 26.0–34.0)
MCHC: 32.7 g/dL (ref 30.0–36.0)
MCV: 97.2 fL (ref 78.0–100.0)
Monocytes Absolute: 0.8 10*3/uL (ref 0.1–1.0)
Monocytes Relative: 7 %
Neutro Abs: 8.3 10*3/uL — ABNORMAL HIGH (ref 1.7–7.7)
Neutrophils Relative %: 71 %
Platelets: 284 10*3/uL (ref 150–400)
RBC: 3.9 MIL/uL (ref 3.87–5.11)
RDW: 13.5 % (ref 11.5–15.5)
WBC: 11.7 10*3/uL — ABNORMAL HIGH (ref 4.0–10.5)

## 2017-03-17 LAB — BASIC METABOLIC PANEL
Anion gap: 8 (ref 5–15)
BUN: 13 mg/dL (ref 6–20)
CO2: 26 mmol/L (ref 22–32)
Calcium: 8.7 mg/dL — ABNORMAL LOW (ref 8.9–10.3)
Chloride: 106 mmol/L (ref 101–111)
Creatinine, Ser: 0.97 mg/dL (ref 0.44–1.00)
GFR calc Af Amer: >60 mL/min (ref >60)
GFR calc non Af Amer: 55 mL/min — ABNORMAL LOW (ref >60)
Glucose, Bld: 97 mg/dL (ref 65–99)
Potassium: 3.4 mmol/L — ABNORMAL LOW (ref 3.5–5.1)
Sodium: 140 mmol/L (ref 135–145)

## 2017-03-17 NOTE — Progress Notes (Signed)
[place] PHYSICAL MEDICINE & REHABILITATION     PROGRESS NOTE  Subjective/Complaints:   Up with PT. Excited to be going home.   ROS: pt denies nausea, vomiting, diarrhea, cough, shortness of breath or chest pain    Objective: Vital Signs: Blood pressure (!) 152/60, pulse 72, temperature 98.1 F (36.7 C), temperature source Oral, resp. rate 16, height 5\' 6"  (1.676 m), weight 101.9 kg (224 lb 10.4 oz), SpO2 100 %. No results found. No results for input(s): WBC, HGB, HCT, PLT in the last 72 hours. Recent Labs    03/17/17 0611  NA 140  K 3.4*  CL 106  GLUCOSE 97  BUN 13  CREATININE 0.97  CALCIUM 8.7*   CBG (last 3)  No results for input(s): GLUCAP in the last 72 hours.  Wt Readings from Last 3 Encounters:  03/06/17 101.9 kg (224 lb 10.4 oz)  03/03/17 100.7 kg (222 lb 0.1 oz)  02/20/17 102.9 kg (226 lb 12.8 oz)    Physical Exam:  BP (!) 152/60 (BP Location: Right Arm)   Pulse 72   Temp 98.1 F (36.7 C) (Oral)   Resp 16   Ht 5\' 6"  (1.676 m)   Wt 101.9 kg (224 lb 10.4 oz)   SpO2 100%   BMI 36.26 kg/m  Constitutional: She appears well-developed and well-nourished. No distress.  HENT: Normocephalic and atraumatic.  Eyes: EOM are normal. No discharge.  Cardiovascular: RRR without murmur. No JVD   . Respiratory: CTA Bilaterally without wheezes or rales. Normal effort     GI: Bowel sounds are normal. She exhibits no distension.  Musculoskeletal: She exhibits no edema or tenderness.  Neurological: She is alert and oriented. mild right intattention Motor: 4+-5/5 throughout Needs gestural cues to understand MMT Continues to demonstrate word finding deficits but is able to convey thoughts and ideas Expressive >Receptive aphasia  Skin: Skin is warm and dry. No rash noted. She is not diaphoretic.  Psychiatric: She has a normal mood and affect. Her behavior is normal.    Assessment/Plan: 1. 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 Physiatrist is providing close team supervision and 24 hour management of active medical problems listed below. Physiatrist and rehab team continue to assess barriers to discharge/monitor patient progress toward functional and medical goals.  Function:  Bathing Bathing position   Position: Shower  Bathing parts Body parts bathed by patient: Right arm, Left arm, Chest, Abdomen, Front perineal area, Right upper leg, Left upper leg, Right lower leg, Left lower leg, Buttocks Body parts bathed by helper: Back  Bathing assist Assist Level: Supervision or verbal cues      Upper Body Dressing/Undressing Upper body dressing   What is the patient wearing?: Bra, Pull over shirt/dress Bra - Perfomed by patient: Thread/unthread right bra strap, Thread/unthread left bra strap Bra - Perfomed by helper: Hook/unhook bra (pull down sports bra) Pull over shirt/dress - Perfomed by patient: Thread/unthread left sleeve, Put head through opening, Pull shirt over trunk, Thread/unthread right sleeve Pull over shirt/dress - Perfomed by helper: Thread/unthread right sleeve        Upper body assist Assist Level: Supervision or verbal cues      Lower Body Dressing/Undressing Lower body dressing   What is the patient wearing?: Underwear, Pants, Non-skid slipper socks Underwear - Performed by patient: Thread/unthread left underwear leg, Pull underwear up/down Underwear - Performed by helper: Thread/unthread right  underwear leg Pants- Performed by patient: Pull pants up/down, Thread/unthread right pants leg, Thread/unthread left pants leg Pants- Performed by helper: Thread/unthread left pants leg Non-skid slipper socks- Performed by patient: Don/doff right sock, Don/doff left sock Non-skid slipper socks- Performed by helper: Don/doff right sock, Don/doff left sock       Shoes - Performed by helper: Don/doff right  shoe, Don/doff left shoe       TED Hose - Performed by helper: Don/doff right TED hose, Don/doff left TED hose  Lower body assist Assist for lower body dressing: Touching or steadying assistance (Pt > 75%)      Toileting Toileting   Toileting steps completed by patient: Adjust clothing prior to toileting, Performs perineal hygiene, Adjust clothing after toileting Toileting steps completed by helper: Adjust clothing after toileting Toileting Assistive Devices: Grab bar or rail  Toileting assist Assist level: Supervision or verbal cues   Transfers Chair/bed transfer   Chair/bed transfer method: Ambulatory Chair/bed transfer assist level: Supervision or verbal cues Chair/bed transfer assistive device: Patent attorney     Max distance: 100 Assist level: Supervision or verbal cues   Wheelchair   Type: Manual Max wheelchair distance: 125 Assist Level: Touching or steadying assistance (Pt > 75%)  Cognition Comprehension Comprehension assist level: Follows basic conversation/direction with extra time/assistive device  Expression Expression assist level: Expresses basic 90% of the time/requires cueing < 10% of the time.  Social Interaction Social Interaction assist level: Interacts appropriately with others with medication or extra time (anti-anxiety, antidepressant).  Problem Solving Problem solving assist level: Solves basic 90% of the time/requires cueing < 10% of the time  Memory Memory assist level: Recognizes or recalls 75 - 89% of the time/requires cueing 10 - 24% of the time    Medical Problem List and Plan: 1.  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   Cont CIR PT, OT SLP   ELOS 1/8 2.  H/oDVT/Anticoagulation: Pharmaceutical: Other (comment)--Eliquis 3. Pain Management: tylenol prn for back pain 4. Mood: LCSW to follow for evaluation and support.  5. Neuropsych: This  patient is capable of making decisions on her own behalf. 6. Skin/Wound Care: routine pressure relief measures 7. Fluids/Electrolytes/Nutrition:  Monitor I/O. Check lytes in am.  8. HTN: Monitor BP bid. Continue Metoprolol bid and Spironolactone   Lotensin increased to 30 on 12/31    Vitals:   03/16/17 2035 03/17/17 0649  BP: 139/72 (!) 152/60  Pulse: 62 72  Resp:  16  Temp:  98.1 F (36.7 C)  SpO2:  100%  controlled 1/7 9. CAD/ICM: Managed with BB and statin.  10 CKD stage III:    Cr. 0.97 on 12/31, improving to baseline 11. Hypothyroid: on supplement  13. Foul smelling urine   Ucx with multiple species, f/u ucx again with multi-species- afebrile, monitor   LOS (Days) 11 A FACE TO FACE EVALUATION WAS PERFORMED  Velina Drollinger T 03/17/2017 9:18 AM

## 2017-03-17 NOTE — Progress Notes (Signed)
Speech Language Pathology Discharge Summary  Patient Details  Name: Yvonne Bailey MRN: [id number] Date of Birth: 08-21-1940  Today's Date: 03/17/2017 SLP Individual Time: 1300-1345 SLP Individual Time Calculation (min): 45 min   Skilled Therapeutic Interventions:  Skilled treatment session focused on completion of caregiver education with pt's granddaughter. SLP facilitated session by providing word finding strategies to increase pt's ability to use expressive language. Both pt and granddaughter voiced understanding. SLP also reviewed need for 24 hour supervision d/t decreased attention to right. These deficits manifested themselves while pt was eating lunch tray, SLP gave cues for granddaughter to better facilitate use of right.     Patient has met 3 of 3 long term goals.  Patient to discharge at overall Supervision level.    Clinical Impression/Discharge Summary:  Pt has made good progress during skilled ST sessions and as  A result she has met 3 of 3 LTGs. Pt continues to experience difficulty with expressive communication and would benefit from [facility] to address word finding within structured activities as well as cognitive safety awareness within home environment to increase functional independence and reduce caregiver burden.    Care Partner:  Caregiver Able to Provide Assistance: Yes  Type of Caregiver Assistance: Cognitive;Physical  Recommendation:  24 hour supervision/assistance;Home Health SLP  Rationale for SLP Follow Up: Maximize functional communication;Reduce caregiver burden   Equipment:     Reasons for discharge: Treatment goals met;Discharged from hospital   Patient/Family Agrees with Progress Made and Goals Achieved: Yes   Function:  Eating Eating   Modified Consistency Diet: No Eating Assist Level: Set up assist for   Eating Set Up Assist For: Opening containers       Cognition Comprehension Comprehension assist level: Follows basic  conversation/direction with extra time/assistive device  Expression   Expression assist level: Expresses basic 90% of the time/requires cueing < 10% of the time.  Social Interaction Social Interaction assist level: Interacts appropriately with others with medication or extra time (anti-anxiety, antidepressant).  Problem Solving Problem solving assist level: Solves basic 90% of the time/requires cueing < 10% of the time  Memory Memory assist level: Recognizes or recalls 75 - 89% of the time/requires cueing 10 - 24% of the time   Davinci Glotfelty 03/17/2017, 1:40 PM

## 2017-03-17 NOTE — Progress Notes (Signed)
Physical Therapy Discharge Summary  Patient Details  Name: Yvonne Bailey MRN: [id number] Date of Birth: [birth year]  Today's Date: 03/17/2017 PT Individual Time: 8333-8329 and 1430-1510 PT Individual Time Calculation (min): 56 min and 40 min  Session 1:  Pt in bed and agreeable to therapy with no c/o pain.  Pt very excited about d/c home tomorrow.  Pt performs bed mobility with mod I, increased time.  Pt performs upper body dressing with assist for bra, needs assist for threading pants.  Seated balance independent, standing balance to pull up pants with supervision.  Gait with RW in home and controlled environments with supervision.  Berg balance test re-tested, improved from 23/56 to 36/56, still at a high risk for falls. Pt educated on high fall risk and recommendation for RW use at home.  Standing balance task on foam with squatting and peg placing task with pt requiring min cuing for Rt UE motor planning, min guard for balance on foam. nustep per pt request x 8 minutes level 4.  Pt left in room with nursing present.  Session 2:  Pt's granddaughter and son present for pt/family education.  Pt/family performed safely gait with RW with supervision, stair negotiation with 1 handrail with close supervision, curb step negotiation with min guard, ramp negotiation with supervision with RW, car transfers with supervision.  Pt and family educated on energy conservation and home safety ideas. Pt and family state they feel safe with d/c home tomorrow.  Patient has met 9 of 9 long term goals due to improved activity tolerance, improved balance, increased strength, ability to compensate for deficits, improved attention and improved awareness.  Patient to discharge at an ambulatory level Supervision.   Patient's care partner is independent to provide the necessary physical and supervision assistance at discharge.  Reasons goals not met: n/a  Recommendation:  Patient will benefit from ongoing skilled PT  services in home health setting to continue to advance safe functional mobility, address ongoing impairments in balance, gait, and minimize fall risk.  Equipment: RW  Reasons for discharge: treatment goals met and discharge from hospital  Patient/family agrees with progress made and goals achieved: Yes  PT Discharge Precautions/Restrictions Precautions Precautions: Fall Precaution Comments: mild R inattention, motor planning deficits Restrictions Weight Bearing Restrictions: No Pain Pain Assessment Pain Assessment: No/denies pain   Cognition Overall Cognitive Status: Impaired/Different from baseline Arousal/Alertness: Awake/alert Awareness: Impaired Awareness Impairment: Emergent impairment Safety/Judgment: Impaired Comments: decreased awareness of physical and visual deficits.  Sensation Sensation Light Touch: Appears Intact Proprioception Impaired Details: Impaired RLE;Impaired RUE Coordination Gross Motor Movements are Fluid and Coordinated: No Fine Motor Movements are Fluid and Coordinated: No Coordination and Movement Description: dysmetric movement with poor initiation on the R  Motor  Motor Motor: Hemiplegia;Motor apraxia;Motor impersistence Motor - Discharge Observations: Rt hemiplegia, decreased awareness of Rt side   Trunk/Postural Assessment  Cervical Assessment Cervical Assessment: Within Functional Limits Thoracic Assessment Thoracic Assessment: Within Functional Limits Lumbar Assessment Lumbar Assessment: (posterior pelvic tilt) Postural Control Protective Responses: delayed  Balance Berg Balance Test Sit to Stand: Able to stand  independently using hands Standing Unsupported: Able to stand safely 2 minutes Sitting with Back Unsupported but Feet Supported on Floor or Stool: Able to sit safely and securely 2 minutes Stand to Sit: Controls descent by using hands Transfers: Able to transfer safely, minor use of hands Standing Unsupported with Eyes  Closed: Able to stand 10 seconds safely Standing Ubsupported with Feet Together: Able to place feet together independently and stand  for 1 minute with supervision From Standing, Reach Forward with Outstretched Arm: Can reach forward >12 cm safely (5") From Standing Position, Pick up Object from Floor: Able to pick up shoe, needs supervision From Standing Position, Turn to Look Behind Over each Shoulder: Turn sideways only but maintains balance Turn 360 Degrees: Able to turn 360 degrees safely but slowly Standing Unsupported, Alternately Place Feet on Step/Stool: Able to complete >2 steps/needs minimal assist Standing Unsupported, One Foot in Front: Loses balance while stepping or standing Standing on One Leg: Unable to try or needs assist to prevent fall Total Score: 36 Extremity Assessment      RLE Assessment RLE Assessment: (grossly 3+/5) LLE Assessment LLE Assessment: Within Functional Limits   See Function Navigator for Current Functional Status.  Jasen Hartstein 03/17/2017, 9:00 AM

## 2017-03-17 NOTE — Progress Notes (Signed)
Occupational Therapy Discharge Summary  Patient Details  Name: Yvonne Bailey MRN: [id number] Date of Birth: 1940/09/02  Today's Date: 03/17/2017 OT Individual Time: 1345-1426 OT Individual Time Calculation (min): 41 min    Patient has met 9 of 9 long term goals due to improved activity tolerance, improved balance, ability to compensate for deficits, functional use of  RIGHT upper and RIGHT lower extremity, improved attention, improved awareness and improved coordination.  Patient to discharge at overall Supervision level.  Patient's care partner is independent to provide the necessary physical and cognitive assistance at discharge.    Reasons goals not met: all goals met  Recommendation:  Patient will benefit from ongoing skilled OT services in home health setting to continue to advance functional skills in the area of BADL and iADL.  Equipment: TTB, BSC  Reasons for discharge: treatment goals met  Patient/family agrees with progress made and goals achieved: Yes   OT Intervention: Upon entering the room, pt seated in recliner chair awaiting therapist with son and granddaughter present for family education. OT educating caregivers on equipment recommendation, [place] expectations, and pt's current progress towards goals. OT demonstrated techniques and cues for safety with pt mobility. Caregivers returned demonstration and pt ambulating with RW to ADL apartment. Pt demonstrated simulated shower transfer onto TTB in tub shower combination. Pt performed with overall close supervision for safety. OT recommended purchase of safety treads to decrease fall risk. Pt returned back to room with supervision from family and seated in recliner chair. No further questions at this time. Call bell and all needed items within reach upon exiting the room.   OT Discharge Precautions/Restrictions  Precautions Precautions: Fall Precaution Comments: mild R inattention, motor planning  deficits Restrictions Weight Bearing Restrictions: No Vital Signs Therapy Vitals Temp: 97.7 F (36.5 C) Temp Source: Oral Pulse Rate: 72 Resp: 18 BP: 118/77 Patient Position (if appropriate): Sitting Oxygen Therapy SpO2: 100 % O2 Device: Not Delivered Pain Pain Assessment Pain Assessment: No/denies pain ADL ADL ADL Comments: see functional navigator Vision Baseline Vision/History: Wears glasses;Glaucoma Cognition Overall Cognitive Status: Impaired/Different from baseline Arousal/Alertness: Awake/alert Orientation Level: Oriented X4 Attention: Selective Sustained Attention: Appears intact Selective Attention: Appears intact Memory: Appears intact Awareness: Impaired Awareness Impairment: Emergent impairment;Anticipatory impairment Problem Solving: Impaired Problem Solving Impairment: Verbal complex;Functional complex Safety/Judgment: Impaired Comments: decreased awareness of physical and visual deficits.  Sensation Sensation Light Touch: Appears Intact Proprioception Impaired Details: Impaired RLE;Impaired RUE Coordination Gross Motor Movements are Fluid and Coordinated: No Fine Motor Movements are Fluid and Coordinated: No Coordination and Movement Description: dysmetric movement with poor initiation on the R  Motor  Motor Motor: Hemiplegia;Motor apraxia;Motor impersistence Motor - Discharge Observations: Rt hemiplegia, decreased awareness of Rt side Mobility  Transfers Transfers: Sit to Stand;Stand to Sit Sit to Stand: 5: Supervision Stand to Sit: 5: Supervision  Trunk/Postural Assessment  Cervical Assessment Cervical Assessment: Within Functional Limits Thoracic Assessment Thoracic Assessment: Within Functional Limits Lumbar Assessment Lumbar Assessment: Exceptions to WFL(posterior pelvic tilt) Postural Control Protective Responses: delayed  Balance Balance Balance Assessed: Yes Static Sitting Balance Static Sitting - Level of Assistance: 5: Stand  by assistance Dynamic Sitting Balance Dynamic Sitting - Level of Assistance: 5: Stand by assistance Extremity/Trunk Assessment RUE Assessment RUE Assessment: Exceptions to [facility] RUE AROM (degrees) RUE Overall AROM Comments: WFL RUE Strength RUE Overall Strength Comments: 3-/5 , can functional use with cues to attend to UE LUE Assessment LUE Assessment: Within Functional Limits   See Function Navigator for Current Functional Status.  Mathis Bud,  Mariyam Remington P 03/17/2017, 4:34 PM

## 2017-03-17 NOTE — Discharge Summary (Signed)
Physician Discharge Summary  Patient ID: Yvonne Bailey MRN: [id number] DOB/AGE: 77-Dec-1942 77 y.o.  Admit date: 03/06/2017 Discharge date: 03/18/2017  Discharge Diagnoses:  Principal Problem:   Acute ischemic cerebrovascular accident (CVA) involving left middle cerebral artery territory [facility]) Active Problems:   Expressive aphasia   Acute ischemic left MCA stroke (HCC)   Benign essential HTN   Leukocytosis   Foul smelling urine   Discharged Condition: stable   Significant Diagnostic Studies:   Labs:  Basic Metabolic Panel: BMP Latest Ref Rng & Units 03/17/2017 03/10/2017 03/07/2017  Glucose 65 - 99 mg/dL 97 409(W) 119(J)  BUN 6 - 20 mg/dL 13 14 20   Creatinine 0.44 - 1.00 mg/dL 4.78 2.95 6.21(H)  Sodium 135 - 145 mmol/L 140 139 139  Potassium 3.5 - 5.1 mmol/L 3.4(L) 3.6 3.7  Chloride 101 - 111 mmol/L 106 110 106  CO2 22 - 32 mmol/L 26 21(L) 24  Calcium 8.9 - 10.3 mg/dL 0.8(M) 5.7(Q) 4.6(N)    CBC: CBC Latest Ref Rng & Units 03/17/2017 03/11/2017 03/07/2017  WBC 4.0 - 10.5 K/uL 11.7(H) 10.2 10.9(H)  Hemoglobin 12.0 - 15.0 g/dL 62.9 52.8 41.3  Hematocrit 36.0 - 46.0 % 37.9 39.3 40.4  Platelets 150 - 400 K/uL 284 240 200    CBG: No results for input(s): GLUCAP in the last 168 hours.  Brief HPI:   Yvonne Bailey a 77 y.o. RH femalewith history of ICM/CAD, HTN, DVT-on Eliquisand question of medical compliance. She was admitted on 03/02/2017 with acute onset of expressive aphasia with question of confusion.  CT perfusion with left frontal cortex and subcortical white matter infarct.  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   MRI brain showed acute nonhemorrhagic left parietal MCA territory infarct, old right BG infarct and numerous old small and  large vessel infarcts and 7 mm hyperdense pituitary lesion-stable. Dr. Pearlean Brownie felt that [facility] branch infarct embolic due to A fib and question of compliance--patient to continue Eliquis per discussion with family. BLE dopplers without DVT.    Patient with expressive > receptive aphasia with mild deficits in speech as well as balance deficits. CIR was recommended due to decline in functional status    Hospital Course: Yvonne Bailey was admitted to rehab 03/06/2017 for inpatient therapies to consist of PT, ST and OT at least three hours five days a week. Past admission physiatrist, therapy team and rehab RN have worked together to provide customized collaborative inpatient rehab. Blood pressures and heart rate were monitored on bid basis and Lotensin was titrated upwards due to labile blood pressures. Heart rate has been controlled and no cardiac symptoms reported with increase in activity. Follow up labs showed SCr has improved to 0.97 but mild hypokalemia noted. She was advised to increase intake of high potassium containing foods.  HHRN to draw BMET on 01/14 with results to Dr. Lawerance Bach.   Serial CBC showed mild drop in H/H and persistent leucocytosis. She has been afebrile during her stay and no signs of infection noted.   UCS checked on 12/31 and 01/2 due to persistent mild leucocytosis with reports of strong urine and was negative for infection.  She was maintained on Eliquis during her stay and is tolerating this without  Any signs of bleeding.  Mood has been  stable and she has shown good progress during her rehab stay. Verbal output has improved and she has made good progress with therapy. Supervision is recommended for safety due to right inattention and expressive deficits. She will continue to receive follow up HHPT, HHOT and HHST by [facility] after discharge.   Rehab course: During patient's stay in rehab weekly team conferences were held to monitor patient's progress, set goals and  discuss barriers to discharge. At admission, patient required min assist with mobility and mod to max assist with ADL tasks.  She exhibited mild aphasia with impaired auditory comprehension of complex tasks, expressive deficits and moderate agraphia with impaired problem solving.  She has had improvement in activity tolerance, balance, postural control, as well as ability to compensate for deficits.  She requires supervision to complete ADL tasks due to apraxia and visual deficits. She is able to perform transfers with  She is able to complete cognitive tasks and expressive needs with supervision.  Family education was completed regarding all aspects of care as well as strategies to improve communication.   Disposition: Home  Diet: Heart Healthy.   Special Instructions: 1. HHRN to draw BMET on 01/14 with results to Dr. Lawerance Bach. 2. Take current medications as prescribed.    Discharge Instructions    Ambulatory referral to Physical Medicine Rehab   Complete by:  As directed    Complexity follow-up 1- 2 weeks left MCA infarction     Allergies as of 03/18/2017      Reactions   Definity [perflutren Lipid Microsphere] Other (See Comments)   Patient experienced back pain with injection   Cilostazol Other (See Comments)    Pletal :" head felt funny"      Medication List    STOP taking these medications   amLODipine 2.5 MG tablet Commonly known as:  NORVASC   clopidogrel 75 MG tablet Commonly known as:  PLAVIX   furosemide 40 MG tablet Commonly known as:  LASIX   Potassium Chloride ER 20 MEQ Tbcr     TAKE these medications   apixaban 5 MG Tabs tablet Commonly known as:  ELIQUIS Take 1 tablet (5 mg total) by mouth 2 (two) times daily.   atorvastatin 80 MG tablet Commonly known as:  LIPITOR Take 1 tablet (80 mg total) by mouth daily.   benazepril 20 MG tablet Commonly known as:  LOTENSIN Take 1.5 tablets (30 mg total) by mouth daily. What changed:  how much to take    levothyroxine 100 MCG tablet Commonly known as:  SYNTHROID, LEVOTHROID Take 100 mcg by mouth daily before breakfast. What changed:  Another medication with the same name was removed. Continue taking this medication, and follow the directions you see here.   metoprolol succinate 50 MG 24 hr tablet Commonly known as:  TOPROL-XL Take 1 tablet (50 mg total) by mouth 2 (two) times daily. Take with or immediately following a meal.   nitroGLYCERIN 0.4 MG SL tablet Commonly known as:  NITROSTAT Place 1 tablet (0.4 mg total) under the tongue every 5 (five) minutes as needed for chest pain.   spironolactone 25 MG tablet Commonly known as:  ALDACTONE Take 1 tablet (25 mg total) by mouth daily.      Follow-up Information    Marcello Fennel, MD Follow up.   Specialty:  Physical Medicine and Rehabilitation Why:  office to call for appointment Contact information: [address] [place] [address] [phone]  Micki Riley, MD Follow up.   Specialties:  Neurology, Radiology Why:  Call for follow-up 6 weeks Contact information: [address] [place] [address] [phone]        Pincus Sanes, MD. Go on 03/31/2017.   Specialty:  Internal Medicine Why:  @ 1:00 PM for post hospital recheck. Needs BMET rechecked Contact information: [address] [place] [address] [phone]           Signed: Jacquelynn Cree 03/18/2017, 4:57 PM

## 2017-03-18 MED ORDER — METOPROLOL SUCCINATE ER 50 MG PO TB24: 50.0000 mg | ORAL_TABLET | Freq: Two times a day (BID) | ORAL | 0 refills | Status: DC

## 2017-03-18 MED ORDER — SPIRONOLACTONE 25 MG PO TABS
25.0000 mg | ORAL_TABLET | Freq: Every day | ORAL | 0 refills | Status: DC
Start: 2017-03-18 — End: 2017-08-06

## 2017-03-18 MED ORDER — APIXABAN 5 MG PO TABS: 5.0000 mg | ORAL_TABLET | Freq: Two times a day (BID) | ORAL | 0 refills | Status: DC

## 2017-03-18 MED ORDER — ATORVASTATIN CALCIUM 80 MG PO TABS: 80.0000 mg | ORAL_TABLET | Freq: Every day | ORAL | 0 refills | Status: DC

## 2017-03-18 MED ORDER — BENAZEPRIL HCL 20 MG PO TABS: 30.0000 mg | ORAL_TABLET | Freq: Every day | ORAL | 0 refills | Status: DC

## 2017-03-18 NOTE — Progress Notes (Signed)
Social Work Discharge Note  The overall goal for the admission was met for:   Discharge location: Yes - home to her house with her granddtr to be with her 24/7  Length of Stay: Yes - 12 days  Discharge activity level: Yes - 24/7 supervision  Home/community participation: Yes  Services provided included: MD, RD, PT, OT, SLP, RN, Pharmacy and [place]: Private Insurance: [place] Medicare  Follow-up services arranged: Home Health: PT/OT/ST from [place], DME: rolling walker; bedside commode; tub transfer bench from [place] and Patient/Family has no preference for HH/DME agencies  Comments (or additional information): Pt to d/c to her home with her granddtr to stay with her.  She and son received family education and feel prepared to care for pt at home.    Patient/Family verbalized understanding of follow-up arrangements: Yes  Individual responsible for coordination of the follow-up plan: pt with her granddtr and son  Confirmed correct DME delivered: Trey Sailors 03/18/2017    Colletta Spillers, Silvestre Mesi

## 2017-03-18 NOTE — Progress Notes (Addendum)
[facility] PHYSICAL MEDICINE & REHABILITATION     PROGRESS NOTE  Subjective/Complaints:  Pt seen lying in bed this AM.  She slept well overnight and is ready for discharge today.   ROS: Denies nausea, vomiting, diarrhea, shortness of breath or chest pain   Objective: Vital Signs: Blood pressure (!) 147/64, pulse 73, temperature 98 F (36.7 C), temperature source Oral, resp. rate 16, height 5\' 6"  (1.676 m), weight 101.9 kg (224 lb 10.4 oz), SpO2 100 %. No results found. Recent Labs    03/17/17 1154  WBC 11.7*  HGB 12.4  HCT 37.9  PLT 284   Recent Labs    03/17/17 0611  NA 140  K 3.4*  CL 106  GLUCOSE 97  BUN 13  CREATININE 0.97  CALCIUM 8.7*   CBG (last 3)  No results for input(s): GLUCAP in the last 72 hours.  Wt Readings from Last 3 Encounters:  03/06/17 101.9 kg (224 lb 10.4 oz)  03/03/17 100.7 kg (222 lb 0.1 oz)  02/20/17 102.9 kg (226 lb 12.8 oz)    Physical Exam:  BP (!) 147/64 (BP Location: Right Arm)   Pulse 73   Temp 98 F (36.7 C) (Oral)   Resp 16   Ht 5\' 6"  (1.676 m)   Wt 101.9 kg (224 lb 10.4 oz)   SpO2 100%   BMI 36.26 kg/m  Constitutional: She appears well-developed and well-nourished. No distress.  HENT: Normocephalic and atraumatic.  Eyes: EOM are normal. No discharge.  Cardiovascular: RRR. No JVD. Respiratory: CTA Bilaterally. Normal effort     GI: Bowel sounds are normal. She exhibits no distension.  Musculoskeletal: She exhibits no edema or tenderness.  Neurological: She is alert and oriented.  Motor: 4+-5/5 throughout  Word finding deficits improving Expressive >>Receptive aphasia  Skin: Skin is warm and dry. No rash noted. She is not diaphoretic.  Psychiatric: She has a normal mood and affect. Her behavior is normal.    Assessment/Plan: 1. 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 Physiatrist is  providing close team supervision and 24 hour management of active medical problems listed below. Physiatrist and rehab team continue to assess barriers to discharge/monitor patient progress toward functional and medical goals.  Function:  Bathing Bathing position   Position: Shower  Bathing parts Body parts bathed by patient: Right arm, Left arm, Chest, Abdomen, Front perineal area, Right upper leg, Left upper leg, Right lower leg, Left lower leg, Buttocks Body parts bathed by helper: Back  Bathing assist Assist Level: Supervision or verbal cues      Upper Body Dressing/Undressing Upper body dressing   What is the patient wearing?: Bra, Pull over shirt/dress(per staff report) Bra - Perfomed by patient: Thread/unthread right bra strap, Thread/unthread left bra strap, Hook/unhook bra (pull down sports bra) Bra - Perfomed by helper: Hook/unhook bra (pull down sports bra) Pull over shirt/dress - Perfomed by patient: Thread/unthread left sleeve, Put head through opening, Pull shirt over trunk, Thread/unthread right sleeve Pull over shirt/dress - Perfomed by helper: Thread/unthread right sleeve        Upper body assist Assist Level: Supervision or verbal cues      Lower Body Dressing/Undressing Lower body dressing   What is the patient wearing?: Underwear, Pants, Non-skid slipper socks Underwear - Performed by patient: Thread/unthread left underwear leg, Pull underwear up/down, Thread/unthread right underwear leg(per staff report) Underwear - Performed by helper:  Thread/unthread right underwear leg Pants- Performed by patient: Pull pants up/down, Thread/unthread right pants leg, Thread/unthread left pants leg Pants- Performed by helper: Thread/unthread left pants leg Non-skid slipper socks- Performed by patient: Don/doff right sock, Don/doff left sock Non-skid slipper socks- Performed by helper: Don/doff right sock, Don/doff left sock       Shoes - Performed by helper: Don/doff right  shoe, Don/doff left shoe       TED Hose - Performed by helper: Don/doff right TED hose, Don/doff left TED hose  Lower body assist Assist for lower body dressing: Supervision or verbal cues, Set up      Toileting Toileting   Toileting steps completed by patient: Adjust clothing prior to toileting, Performs perineal hygiene, Adjust clothing after toileting Toileting steps completed by helper: Adjust clothing after toileting Toileting Assistive Devices: Grab bar or rail  Toileting assist Assist level: Supervision or verbal cues   Transfers Chair/bed transfer   Chair/bed transfer method: Ambulatory Chair/bed transfer assist level: Supervision or verbal cues Chair/bed transfer assistive device: Patent attorney     Max distance: 100 Assist level: Supervision or verbal cues   Wheelchair   Type: Manual Max wheelchair distance: 125 Assist Level: Touching or steadying assistance (Pt > 75%)  Cognition Comprehension Comprehension assist level: Follows basic conversation/direction with extra time/assistive device  Expression Expression assist level: Expresses basic 90% of the time/requires cueing < 10% of the time.  Social Interaction Social Interaction assist level: Interacts appropriately with others with medication or extra time (anti-anxiety, antidepressant).  Problem Solving Problem solving assist level: Solves basic 90% of the time/requires cueing < 10% of the time  Memory Memory assist level: Recognizes or recalls 75 - 89% of the time/requires cueing 10 - 24% of the time    Medical Problem List and Plan: 1.  Expressive >> Receptive aphasia with limitations in ADLs secondary to acute nonhemorrhagic left parietal MCA territory infarct.   D/c today  Will see patient for transitional care management in 1-2 weeks 2.  H/oDVT/Anticoagulation: Pharmaceutical: Other (comment)--Eliquis 3. Pain Management: tylenol prn for back pain 4. Mood: LCSW to follow for evaluation  and support.  5. Neuropsych: This patient is ?fully capable of making decisions on her own behalf. 6. Skin/Wound Care: routine pressure relief measures 7. Fluids/Electrolytes/Nutrition:  Monitor I/O.  8. HTN: Monitor BP bid. Continue Metoprolol bid and Spironolactone   Lotensin increased to 30 on 12/31    Vitals:   03/17/17 2224 03/18/17 0600  BP: (!) 171/75 (!) 147/64  Pulse: 74 73  Resp:  16  Temp:  98 F (36.7 C)  SpO2:  100%    Labile, with elevation yesterday evening, otherwise relatively controlled  Will need ambulatory follow up 9. CAD/ICM: Managed with BB and statin.  10 CKD stage III:    Cr. 0.97 on 1/7 11. Hypothyroid: on supplement 13. Foul smelling urine   Ucx with multiple species, f/u ucx again with multi-species-     Afebrile, monitor   LOS (Days) 12 A FACE TO FACE EVALUATION WAS PERFORMED  Byrd Rushlow Karis Juba 03/18/2017 8:30 AM

## 2017-03-18 NOTE — Progress Notes (Signed)
Pt discharged to home with granddaughter. Delle Reining gave discharge instructions. All belongings went with patient.

## 2017-03-18 NOTE — Discharge Summary (Deleted)
NAME:  ORRA, COMI                ACCOUNT NO.:  MEDICAL RECORD NO.:  [id number]  LOCATION:                                 FACILITY:  PHYSICIAN:  Maryla Morrow, MD        DATE OF BIRTH:  1940/04/07  DATE OF ADMISSION:  03/07/2017 DATE OF DISCHARGE:  03/18/2017                              DISCHARGE SUMMARY   DISCHARGE DIAGNOSES: 1. 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 2. Eliquis for deep venous thrombosis prophylaxis. 3. Hypertension. 4. Coronary artery disease with cardiomyopathy. 5. Chronic kidney disease, stage III. 6. Hypothyroidism.  HISTORY OF PRESENT ILLNESS:  This is a 77 year old right-handed female   DICTATION ENDS HERE     Mariam Dollar, P.A.   ______________________________ Maryla Morrow, MD    DA/MEDQ  D:  03/17/2017  T:  03/18/2017  Job:  [id number]  cc:   Maryla Morrow, MD Pramod P. Pearlean Brownie, MD Stacy Dr. Lawerance Bach

## 2017-03-19 ENCOUNTER — Telehealth: Payer: Self-pay

## 2017-03-19 DIAGNOSIS — I739 Peripheral vascular disease, unspecified: Secondary | ICD-10-CM | POA: Diagnosis not present

## 2017-03-19 DIAGNOSIS — I69320 Aphasia following cerebral infarction: Secondary | ICD-10-CM | POA: Diagnosis not present

## 2017-03-19 DIAGNOSIS — I69312 Visuospatial deficit and spatial neglect following cerebral infarction: Secondary | ICD-10-CM | POA: Diagnosis not present

## 2017-03-19 DIAGNOSIS — I130 Hypertensive heart and chronic kidney disease with heart failure and stage 1 through stage 4 chronic kidney disease, or unspecified chronic kidney disease: Secondary | ICD-10-CM | POA: Diagnosis not present

## 2017-03-19 DIAGNOSIS — N183 Chronic kidney disease, stage 3 (moderate): Secondary | ICD-10-CM | POA: Diagnosis not present

## 2017-03-19 DIAGNOSIS — I5022 Chronic systolic (congestive) heart failure: Secondary | ICD-10-CM | POA: Diagnosis not present

## 2017-03-19 DIAGNOSIS — I255 Ischemic cardiomyopathy: Secondary | ICD-10-CM | POA: Diagnosis not present

## 2017-03-19 DIAGNOSIS — I2510 Atherosclerotic heart disease of native coronary artery without angina pectoris: Secondary | ICD-10-CM | POA: Diagnosis not present

## 2017-03-19 DIAGNOSIS — I69351 Hemiplegia and hemiparesis following cerebral infarction affecting right dominant side: Secondary | ICD-10-CM | POA: Diagnosis not present

## 2017-03-19 NOTE — Telephone Encounter (Signed)
Transitional Care call  Patient name: Yvonne Bailey) DOB: ([birth year]) 1. Are you/is patient experiencing any problems since coming home? (no) a. Are there any questions regarding any aspect of care? (no) 2. Are there any questions regarding medications administration/dosing? (no) a. Are meds being taken as prescribed? (yes) b. "Patient should review meds with caller to confirm"  3. Have there been any falls? (no) 4. Has Home Health been to the house and/or have they contacted you? (yes) a. If not, have you tried to contact them? (N/A) b. Can we help you contact them? (No) 5. Are bowels and bladder emptying properly? (yes) a. Are there any unexpected incontinence issues? (no) b. If applicable, is patient following bowel/bladder programs? (N/A) 6. Any fevers, problems with breathing, unexpected pain? (no) 7. Are there any skin problems or new areas of breakdown? (no) 8. Has the patient/family member arranged specialty MD follow up (ie cardiology/neurology/renal/surgical/etc.)?  (yes) a. Can we help arrange? (no) 9. Does the patient need any other services or support that we can help arrange? (no) 10. Are caregivers following through as expected in assisting the patient? (yes) 11. Has the patient quit smoking, drinking alcohol, or using drugs as recommended? (patient never smoked   Appointment date/time (03/27/17 @ 11:40), arrive time (11:20) and who it is with here Allena Katz) [address]

## 2017-03-20 ENCOUNTER — Telehealth: Payer: Self-pay

## 2017-03-20 ENCOUNTER — Telehealth: Payer: Self-pay | Admitting: *Deleted

## 2017-03-20 ENCOUNTER — Telehealth (HOSPITAL_COMMUNITY): Payer: Self-pay

## 2017-03-20 DIAGNOSIS — I739 Peripheral vascular disease, unspecified: Secondary | ICD-10-CM | POA: Diagnosis not present

## 2017-03-20 DIAGNOSIS — I130 Hypertensive heart and chronic kidney disease with heart failure and stage 1 through stage 4 chronic kidney disease, or unspecified chronic kidney disease: Secondary | ICD-10-CM | POA: Diagnosis not present

## 2017-03-20 DIAGNOSIS — I2510 Atherosclerotic heart disease of native coronary artery without angina pectoris: Secondary | ICD-10-CM | POA: Diagnosis not present

## 2017-03-20 DIAGNOSIS — I5022 Chronic systolic (congestive) heart failure: Secondary | ICD-10-CM | POA: Diagnosis not present

## 2017-03-20 DIAGNOSIS — I69351 Hemiplegia and hemiparesis following cerebral infarction affecting right dominant side: Secondary | ICD-10-CM | POA: Diagnosis not present

## 2017-03-20 DIAGNOSIS — N183 Chronic kidney disease, stage 3 (moderate): Secondary | ICD-10-CM | POA: Diagnosis not present

## 2017-03-20 DIAGNOSIS — I69312 Visuospatial deficit and spatial neglect following cerebral infarction: Secondary | ICD-10-CM | POA: Diagnosis not present

## 2017-03-20 DIAGNOSIS — I255 Ischemic cardiomyopathy: Secondary | ICD-10-CM | POA: Diagnosis not present

## 2017-03-20 DIAGNOSIS — I69320 Aphasia following cerebral infarction: Secondary | ICD-10-CM | POA: Diagnosis not present

## 2017-03-20 NOTE — Telephone Encounter (Signed)
Shanda PT [facility] called requesting verbal orders of physical therapy for 1xwk X 9wks, called her back and approved verbal orders.

## 2017-03-20 NOTE — Telephone Encounter (Signed)
Called pt spoke w/caretaker Illene Labrador) which is also her caretaker. Verified appt that had been set-up for 03/31/17 w/Dr. Lawerance Bach. Also inform Yvonne Bailey had some additional questions concerning discharge. Completed TCM call below.Raechel Chute  Transition Care Management Follow-up Telephone Call   Date discharged? 03/18/17   How have you been since you were released from the hospital? Yvonne Bailey states grandma seem to be doing ok. She had mention that she is just tired   Do you understand why you were in the hospital? YES   Do you understand the discharge instructions? YES   Where were you discharged to? Home   Items Reviewed:  Medications reviewed: YES  Allergies reviewed: YES  Dietary changes reviewed: YES, heart healty  Referrals reviewed: YES confirm appt for Dr. Allena Katz   Functional Questionnaire:   Activities of Daily Living (ADLs):   She states she are independent in the following: feeding, continence, grooming and toileting  States she require assistance with the following: ambulation, bathing and hygiene and dressing   Any transportation issues/concerns?: NO   Any patient concerns? YES   Confirmed importance and date/time of follow-up visits scheduled YES, appt 03/31/17  Provider Appointment booked with Dr. Lawerance Bach  Confirmed with patient if condition begins to worsen call PCP or go to the ER.  Patient was given the office number and encouraged to call back with question or concerns.  : YES

## 2017-03-20 NOTE — Telephone Encounter (Signed)
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 Pt D/C 03/18/17, and was scheduled to see Dr. Lawerance Bach on 03/31/17 per hospital records. Will call pt to verify appt...Yvonne Bailey

## 2017-03-20 NOTE — Telephone Encounter (Signed)
Boneta Lucks, ST, [facility] left a message asking for verbal orders for speech therapy plan of care 2week4.  Verbal orders given per office protocol

## 2017-03-20 NOTE — Telephone Encounter (Signed)
Called to schedule f/u, left message for pt to return call. AW 

## 2017-03-25 ENCOUNTER — Telehealth: Payer: Self-pay | Admitting: Internal Medicine

## 2017-03-25 DIAGNOSIS — I739 Peripheral vascular disease, unspecified: Secondary | ICD-10-CM | POA: Diagnosis not present

## 2017-03-25 DIAGNOSIS — I255 Ischemic cardiomyopathy: Secondary | ICD-10-CM | POA: Diagnosis not present

## 2017-03-25 DIAGNOSIS — I69351 Hemiplegia and hemiparesis following cerebral infarction affecting right dominant side: Secondary | ICD-10-CM | POA: Diagnosis not present

## 2017-03-25 DIAGNOSIS — I5022 Chronic systolic (congestive) heart failure: Secondary | ICD-10-CM | POA: Diagnosis not present

## 2017-03-25 DIAGNOSIS — I69320 Aphasia following cerebral infarction: Secondary | ICD-10-CM | POA: Diagnosis not present

## 2017-03-25 DIAGNOSIS — I69312 Visuospatial deficit and spatial neglect following cerebral infarction: Secondary | ICD-10-CM | POA: Diagnosis not present

## 2017-03-25 DIAGNOSIS — I2510 Atherosclerotic heart disease of native coronary artery without angina pectoris: Secondary | ICD-10-CM | POA: Diagnosis not present

## 2017-03-25 DIAGNOSIS — I130 Hypertensive heart and chronic kidney disease with heart failure and stage 1 through stage 4 chronic kidney disease, or unspecified chronic kidney disease: Secondary | ICD-10-CM | POA: Diagnosis not present

## 2017-03-25 DIAGNOSIS — N183 Chronic kidney disease, stage 3 (moderate): Secondary | ICD-10-CM | POA: Diagnosis not present

## 2017-03-25 NOTE — Telephone Encounter (Signed)
Copied from CRM [id number]. Topic: Inquiry >> Mar 25, 2017  2:52 PM Alexander Bergeron B wrote: Reason for CRM: pt's son called and is asking for another referral for home health therapy (occupational therapy), pt states that its unsatisfactory on the job or work that's been done, contact to advise b/c he doesn't want any interruption in services but son's feel that the current place that is helping is not doing anything to help pt

## 2017-03-25 NOTE — Telephone Encounter (Signed)
She ws referred by the hospital during her hospitalization.  I do not see her until later this month -- can I refer her someplace new before seeing her?  Not sure if she is doing PT, OT or both.

## 2017-03-26 ENCOUNTER — Telehealth: Payer: Self-pay

## 2017-03-26 ENCOUNTER — Telehealth: Payer: Self-pay | Admitting: *Deleted

## 2017-03-26 NOTE — Telephone Encounter (Signed)
Yvonne Bailey - can you please call her son and inform him of the following.

## 2017-03-26 NOTE — Telephone Encounter (Signed)
[name] called about this patient.  States is having problems and issues with [facility], that they will address tomorrow at her appointment, and is requesting that her Home Health be stopped with [facility] and instead be referred to [facility] instead.  Noted on patients appointments that her transitional care appointment is tomorrow with Dr. Allena Katz..  Called patient and advised them to make sure they are at this appointment and to speak with Dr. Allena Katz, her attending physician, about these issues and to see if that change could be made.

## 2017-03-26 NOTE — Telephone Encounter (Signed)
Dr Lawerance Bach yes you would need to see patient first. Also patient would have to have current home health case close before any one else can take patient.

## 2017-03-26 NOTE — Telephone Encounter (Signed)
Thank you :)

## 2017-03-26 NOTE — Telephone Encounter (Signed)
Occupational therapist left a message asking for verbal orders 1week5  For [facility] OT. Returned phone call and gave verbal orders per office protocol

## 2017-03-27 ENCOUNTER — Encounter: Payer: Medicare HMO | Attending: Physical Medicine & Rehabilitation | Admitting: Physical Medicine & Rehabilitation

## 2017-03-27 ENCOUNTER — Encounter: Payer: Self-pay | Admitting: Physical Medicine & Rehabilitation

## 2017-03-27 VITALS — BP 159/84 | HR 66 | Resp 14

## 2017-03-27 DIAGNOSIS — Z8249 Family history of ischemic heart disease and other diseases of the circulatory system: Secondary | ICD-10-CM | POA: Insufficient documentation

## 2017-03-27 DIAGNOSIS — Z9861 Coronary angioplasty status: Secondary | ICD-10-CM | POA: Insufficient documentation

## 2017-03-27 DIAGNOSIS — I252 Old myocardial infarction: Secondary | ICD-10-CM | POA: Insufficient documentation

## 2017-03-27 DIAGNOSIS — R4701 Aphasia: Secondary | ICD-10-CM

## 2017-03-27 DIAGNOSIS — Z90710 Acquired absence of both cervix and uterus: Secondary | ICD-10-CM | POA: Diagnosis not present

## 2017-03-27 DIAGNOSIS — R32 Unspecified urinary incontinence: Secondary | ICD-10-CM | POA: Insufficient documentation

## 2017-03-27 DIAGNOSIS — I739 Peripheral vascular disease, unspecified: Secondary | ICD-10-CM | POA: Insufficient documentation

## 2017-03-27 DIAGNOSIS — R3915 Urgency of urination: Secondary | ICD-10-CM | POA: Diagnosis not present

## 2017-03-27 DIAGNOSIS — E785 Hyperlipidemia, unspecified: Secondary | ICD-10-CM | POA: Insufficient documentation

## 2017-03-27 DIAGNOSIS — I255 Ischemic cardiomyopathy: Secondary | ICD-10-CM | POA: Insufficient documentation

## 2017-03-27 DIAGNOSIS — Z87891 Personal history of nicotine dependence: Secondary | ICD-10-CM | POA: Diagnosis not present

## 2017-03-27 DIAGNOSIS — I69320 Aphasia following cerebral infarction: Secondary | ICD-10-CM | POA: Insufficient documentation

## 2017-03-27 DIAGNOSIS — I10 Essential (primary) hypertension: Secondary | ICD-10-CM | POA: Insufficient documentation

## 2017-03-27 DIAGNOSIS — N390 Urinary tract infection, site not specified: Secondary | ICD-10-CM

## 2017-03-27 DIAGNOSIS — R2681 Unsteadiness on feet: Secondary | ICD-10-CM

## 2017-03-27 DIAGNOSIS — R350 Frequency of micturition: Secondary | ICD-10-CM | POA: Diagnosis not present

## 2017-03-27 DIAGNOSIS — I63512 Cerebral infarction due to unspecified occlusion or stenosis of left middle cerebral artery: Secondary | ICD-10-CM | POA: Diagnosis not present

## 2017-03-27 DIAGNOSIS — I2510 Atherosclerotic heart disease of native coronary artery without angina pectoris: Secondary | ICD-10-CM | POA: Insufficient documentation

## 2017-03-27 DIAGNOSIS — E039 Hypothyroidism, unspecified: Secondary | ICD-10-CM | POA: Diagnosis not present

## 2017-03-27 DIAGNOSIS — R269 Unspecified abnormalities of gait and mobility: Secondary | ICD-10-CM | POA: Diagnosis not present

## 2017-03-27 DIAGNOSIS — Z86718 Personal history of other venous thrombosis and embolism: Secondary | ICD-10-CM | POA: Diagnosis not present

## 2017-03-27 DIAGNOSIS — I6990 Unspecified sequelae of unspecified cerebrovascular disease: Secondary | ICD-10-CM | POA: Diagnosis not present

## 2017-03-27 DIAGNOSIS — I82409 Acute embolism and thrombosis of unspecified deep veins of unspecified lower extremity: Secondary | ICD-10-CM | POA: Diagnosis not present

## 2017-03-27 DIAGNOSIS — Z7901 Long term (current) use of anticoagulants: Secondary | ICD-10-CM | POA: Insufficient documentation

## 2017-03-27 DIAGNOSIS — I6381 Other cerebral infarction due to occlusion or stenosis of small artery: Secondary | ICD-10-CM | POA: Diagnosis not present

## 2017-03-27 DIAGNOSIS — Z90722 Acquired absence of ovaries, bilateral: Secondary | ICD-10-CM | POA: Diagnosis not present

## 2017-03-27 DIAGNOSIS — Z833 Family history of diabetes mellitus: Secondary | ICD-10-CM | POA: Insufficient documentation

## 2017-03-27 DIAGNOSIS — Z823 Family history of stroke: Secondary | ICD-10-CM | POA: Diagnosis not present

## 2017-03-27 NOTE — Telephone Encounter (Signed)
Spoke with pts son, it looks like Dr Allena Katz has put in a referral to PT but not Speech and OT. Pt is coming to see [place] on Monday and son was informed that referrals can be entered then.

## 2017-03-27 NOTE — Progress Notes (Addendum)
Subjective:    Patient ID: Yvonne Bailey, female    DOB: [birth year], 77 y.o.   MRN: [id number]  HPI 77 y.o. RH female with history of ICM/CAD, HTN, DVT-on Eliquis and question of medical compliance presents for transitional care management after receiving CIR for left MCA infarct.  Admit date: 03/06/2017 Discharge date: 03/18/2017  Family presents, who provides much of history. At discharge, she was instructed to follow up with Neurology, but she does not have an appointment. She sees PCP next week. She did not have BMP drawn. Her BP remains elevated. Denies dysuria, but is having incontinence. Denies falls. Family is not happy with [facility] and therapies and would like a different company.   Mobility: Walker at all times.  Therapies: 2/week DME: Shower bench, bedside commode.  Shower bench is too big, wants chair.   Pain Inventory Average Pain 0 Pain Right Now 0 My pain is no pain  In the last 24 hours, has pain interfered with the following? General activity 0 Relation with others 0 Enjoyment of life 0 What TIME of day is your pain at its worst? no pain Sleep (in general) Good  Pain is worse with: no pain Pain improves with: no pain Relief from Meds: no pain  Mobility walk with assistance use a walker do you drive?  no  Function retired  Neuro/Psych bladder control problems confusion  Prior Studies hospital follow up  Physicians involved in your care hospital follow up   Family History  Problem Relation Age of Onset  . Diabetes Mother   . Hypertension Mother   . Stroke Brother        ?> 55  . Coronary artery disease Brother        stent in 36s  . Cancer Neg Hx    Social History   Socioeconomic History  . Marital status: Married    Spouse name: [name]  . Number of children: 1  . Years of education: college  . Highest education level: None  Social Needs  . Financial resource strain: None  . Food insecurity - worry: None  .  Food insecurity - inability: None  . Transportation needs - medical: None  . Transportation needs - non-medical: None  Occupational History  . Occupation: Pharmacist, hospital    Comment: Retired  Tobacco Use  . Smoking status: Former Smoker    Last attempt to quit: 03/11/1990    Years since quitting: 27.0  . Smokeless tobacco: Never Used  . Tobacco comment: smoked [place], up to < 5 cigarettes  Substance and Sexual Activity  . Alcohol use: No  . Drug use: No  . Sexual activity: None  Other Topics Concern  . 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 08/31/09- designated party form signed appointing, husband [name]; ok to leave msg on home phone [phone].      Caffeine Small Amount one cup very rare.   Right handed.   One child- [name]   Past Surgical History:  Procedure Laterality Date  . APPENDECTOMY     at hysterectomy and USO for fibroids, Dr. Ysidro Evert  . CARDIAC CATHETERIZATION  1992   Dr Eustace Quail  . CARDIAC CATHETERIZATION N/A 09/06/2015   Procedure: Right/Left Heart Cath and Coronary Angiography;  Surgeon: Burnell Blanks, MD;  Location: [facility];  Service: Cardiovascular;  Laterality: N/A;  . CARDIAC CATHETERIZATION N/A 09/07/2015   Procedure:  Coronary Stent Intervention;  Surgeon: Burnell Blanks, MD;  Location: [facility];  Service: Cardiovascular;  Laterality: N/A;  . COLONOSCOPY     negative; 2008, Dr. Delfin Edis  . fracture LLE     '94; pinned  . IR ANGIO INTRA EXTRACRAN SEL COM CAROTID INNOMINATE BILAT MOD SED  03/02/2017  . IR ANGIO VERTEBRAL SEL VERTEBRAL UNI R MOD SED  03/02/2017  . RADIOLOGY WITH ANESTHESIA N/A 03/02/2017   Procedure: RADIOLOGY WITH ANESTHESIA;  Surgeon: Luanne Bras, MD;  Location: [facility];  Service: Radiology;  Laterality: N/A;  . TONSILLECTOMY    . TOTAL ABDOMINAL HYSTERECTOMY     & BSO for fibroids   Past Medical History:    Diagnosis Date  . CAD (coronary artery disease)    S/P  anterior  wall myocardial infarction in '92, treated w/percutaneous transluminal coronary angioplasty of the left anterior descending. EF 45%  . Carotid artery disease ([place])   . DVT (deep venous thrombosis) ([place])    X1  . History of nuclear stress test    a. Myoview 6/17: EF 20-25%, mid anteroseptal, apical anterior, apical septal, apical inferior, apical lateral and apical scar, no ischemia, intermediate risk  . HTN (hypertension)   . Hyperlipidemia   . Hypothyroidism   . Ischemic cardiomyopathy    a. Echo 6/17: EF 20-25%, apex appears akinetic, MAC, moderate MR, moderate LAE, mild RVE, trivial PI, PASP 47 mmHg (needs repeat with Definity contrast) //  b. Limited echo with Definity contrast 7/17: EF 25-30%, moderate to severe LAE  . MI (myocardial infarction) ([place]) 1992   at age 63; TIA treated with coumadin until Plavix initiated in 2006  . PAD (peripheral artery disease) ([facility])    Right SFA occlusion, severe disease left CFA and SFA   BP (!) 159/84 (BP Location: Right Arm, Patient Position: Sitting, Cuff Size: Large)   Pulse 66   Resp 14   SpO2 98%   Opioid Risk Score:   Fall Risk Score:  `1  Depression screen PHQ 2/9  Depression screen [facility] 2/9 02/04/2017 11/21/2016 01/12/2016 11/02/2015 10/09/2015 12/15/2014 05/14/2013  Decreased Interest 0 0 0 0 0 0 0  Down, Depressed, Hopeless 0 0 0 0 0 0 0  PHQ - 2 Score 0 0 0 0 0 0 0  Altered sleeping - 0 - - - - -  Tired, decreased energy - 0 - - - - -  Change in appetite - 0 - - - - -  Feeling bad or failure about yourself  - 0 - - - - -  Trouble concentrating - 0 - - - - -  Moving slowly or fidgety/restless - 0 - - - - -  Suicidal thoughts - 0 - - - - -  PHQ-9 Score - 0 - - - - -  Difficult doing work/chores - Not difficult at all - - - - -    Review of Systems  Constitutional: Negative.   HENT: Negative.   Eyes: Negative.   Respiratory: Negative.   Cardiovascular: Negative.    Gastrointestinal: Negative.   Endocrine: Negative.   Genitourinary: Positive for frequency and urgency.       Incontinence  Musculoskeletal: Positive for gait problem.  Skin: Negative.   Allergic/Immunologic: Negative.   Hematological: Negative.   Psychiatric/Behavioral: Negative.   All other systems reviewed and are negative.      Objective:   Physical Exam Constitutional: She appears well-developed and well-nourished. No distress.  HENT: Normocephalic and  atraumatic.  Eyes: EOM are normal. No discharge.  Cardiovascular: RRR. No JVD. Respiratory: CTA Bilaterally. Normal effort     GI: Bowel sounds are normal. She exhibits no distension.  Musculoskeletal: She exhibits no edema or tenderness.  Neurological: She is alert and oriented.  Motor: 4+/5 throughout  Word finding deficits improving Expressive >>Receptive aphasia  Skin: Skin is warm and dry. No rash noted. She is not diaphoretic.  Psychiatric: She has a normal mood and affect. Her behavior is normal.     Assessment & Plan:  77 y.o. RH female with history of ICM/CAD, HTN, DVT-on Eliquis and question of medical compliance presents for transitional care management after receiving CIR for left MCA infarct.  1. 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   Cont therapies, will speak with in patient case manager regarding changing companies.   Would like change in DME  Follow up with Neurology, needs appointments  Cont Supervision  2.  H/o DVT   Cont Eliquis  3. HTN  Remains elevated  Follow up with PCP for further adjustments  4. Gait abnormality  Cont therapies  Cont walker for safety  5. Urinary frequency  Will order UA/Ucx  Will consider meds  Meds reviewed Referrals reviewed All questions answered  Addendum: UA+, Ucx pending.  Empiric Macrobid ordered.  Notified patient.

## 2017-03-28 LAB — URINALYSIS, ROUTINE W REFLEX MICROSCOPIC
Bilirubin, UA: NEGATIVE
Glucose, UA: NEGATIVE
Ketones, UA: NEGATIVE
Nitrite, UA: POSITIVE — AB
Protein, UA: NEGATIVE
RBC, UA: NEGATIVE
Specific Gravity, UA: 1.021 (ref 1.005–1.030)
Urobilinogen, Ur: 0.2 mg/dL (ref 0.2–1.0)
pH, UA: 6.5 (ref 5.0–7.5)

## 2017-03-28 LAB — MICROSCOPIC EXAMINATION
Casts: NONE SEEN /lpf
Epithelial Cells (non renal): >10 /hpf — AB (ref 0–10)

## 2017-03-28 LAB — URINE CULTURE

## 2017-03-28 MED ORDER — NITROFURANTOIN MONOHYD MACRO 100 MG PO CAPS: 100.0000 mg | ORAL_CAPSULE | Freq: Two times a day (BID) | ORAL | 0 refills | Status: AC

## 2017-03-28 NOTE — Addendum Note (Signed)
Addended by: Maryla Morrow A on: 03/28/2017 12:14 PM   Modules accepted: Orders

## 2017-03-30 NOTE — Progress Notes (Signed)
Subjective:    Patient ID: Yvonne Bailey, female    DOB: 02/09/1941, 77 y.o.   MRN: [id number]  HPI The patient is here for follow up from the hospital.  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   She has has ICM/CAD, htn, DVT on eliquis with questionable medical compliance.  She was admitted 03/02/17 for acute expressive aphasia with question of confusion.  Imaging showed acute nonhemorrhagic left parietal MCA territory infarct, old right BG infarct and numerous old small and large vessel infarcts.  Neurology felt that the left MCA branch infarct was likely embolic due to A. fib with questionable compliance with Eliquis.  Doppler showed negative DVTs of the lower extremities.  She had expressive and receptive aphasia with mild deficits in speech as well as balance deficits.  She was sent to inpatient rehab   And admitted there 12/27/184 PT, ST and OT.  She requires supervision to complete all ADLs.  She can perform transfers.      Expressive aphasia, poor balance, mild weakness secondary to acute nonhemorrhagic left parietal MCA territory infarct: Did inpatient rehab Needs home PT, speech therapy and OT - needs referral ordered Will call neuro for follow up appt Using walker Taking eliquis twice daily Taking lipitor BP elevated - will adjust medication  H/o DVT, multiple CVAs: Continue eliquis  Hypertension: Taking her medications BP still elevated Restart amlodipine 2.5 mg daily Continue current medications  Frequent urination, UTI: Just started on an antibiotic for UTI She sometimes has incontinence, but having increased frequency - ? Improvement since starting the antibiotic Urine culture inconclusive - complete entire course of antibiotic   She gets winded with activity.  This is not new, ? Worse  Hypothyroidism:  She is taking her medication daily.  She denies any recent changes in energy or weight that are  unexplained.    Medications and allergies reviewed with patient and updated if appropriate.  Patient Active Problem List   Diagnosis Date Noted  . Foul smelling urine   . Leukocytosis   . Aphasia as late effect of cerebrovascular accident (CVA)   . Benign essential HTN   . Acute ischemic cerebrovascular accident (CVA) involving left middle cerebral artery territory [facility])   . Expressive aphasia   . Coronary artery disease involving native coronary artery without angina pectoris   . Stage 3 chronic kidney disease ([place])   . Medically noncompliant   . Stroke (cerebrum) ([place]) 03/02/2017  . Prediabetes 07/28/2016  . DOE (dyspnea on exertion) 07/09/2016  . Gait instability 07/09/2016  . Itching 04/16/2016  . Unstable angina ([place]) 09/06/2015  . Cardiomyopathy, ischemic   . Chronic systolic heart failure ([place]) 08/21/2015  . Bilateral leg edema 07/19/2015  . Atrial fibrillation ([place]) 07/19/2015  . Urinary urgency 03/30/2015  . Other musculoskeletal symptoms referable to limbs(729.89) 12/17/2012  . Lacunar infarction 12/17/2012  . Small vessel disease, cerebrovascular 12/17/2012  . CAROTID BRUIT, RIGHT 08/10/2008  . Peripheral vascular disease ([place]) 06/04/2007  . Diverticulosis of large intestine 06/04/2007  . Hypothyroidism 02/24/2007  . Hyperlipidemia 02/24/2007  . Essential hypertension 02/24/2007  . Acute thromboembolism of deep veins of lower extremity ([place]) 01/21/2007  . Coronary atherosclerosis 10/29/2006    Current Outpatient Medications on File Prior to Visit  Medication Sig Dispense Refill  . apixaban (ELIQUIS) 5 MG TABS tablet Take 1 tablet (5 mg total) by mouth 2 (two) times daily. 60 tablet 0  . atorvastatin (LIPITOR)  80 MG tablet Take 1 tablet (80 mg total) by mouth daily. 30 tablet 0  . benazepril (LOTENSIN) 20 MG tablet Take 1.5 tablets (30 mg total) by mouth daily. 45 tablet 0  . levothyroxine (SYNTHROID, LEVOTHROID) 100 MCG tablet Take 100 mcg by mouth daily before  breakfast.    . metoprolol succinate (TOPROL-XL) 50 MG 24 hr tablet Take 1 tablet (50 mg total) by mouth 2 (two) times daily. Take with or immediately following a meal. 60 tablet 0  . nitrofurantoin, macrocrystal-monohydrate, (MACROBID) 100 MG capsule Take 1 capsule (100 mg total) by mouth 2 (two) times daily for 7 days. 14 capsule 0  . nitroGLYCERIN (NITROSTAT) 0.4 MG SL tablet Place 1 tablet (0.4 mg total) under the tongue every 5 (five) minutes as needed for chest pain. 25 tablet 1  . spironolactone (ALDACTONE) 25 MG tablet Take 1 tablet (25 mg total) by mouth daily. 30 tablet 0   No current facility-administered medications on file prior to visit.     Past Medical History:  Diagnosis Date  . CAD (coronary artery disease)    S/P  anterior  wall myocardial infarction in '92, treated w/percutaneous transluminal coronary angioplasty of the left anterior descending. EF 45%  . Carotid artery disease ([place])   . DVT (deep venous thrombosis) ([place])    X1  . History of nuclear stress test    a. Myoview 6/17: EF 20-25%, mid anteroseptal, apical anterior, apical septal, apical inferior, apical lateral and apical scar, no ischemia, intermediate risk  . HTN (hypertension)   . Hyperlipidemia   . Hypothyroidism   . Ischemic cardiomyopathy    a. Echo 6/17: EF 20-25%, apex appears akinetic, MAC, moderate MR, moderate LAE, mild RVE, trivial PI, PASP 47 mmHg (needs repeat with Definity contrast) //  b. Limited echo with Definity contrast 7/17: EF 25-30%, moderate to severe LAE  . MI (myocardial infarction) ([place]) 1992   at age 57; TIA treated with coumadin until Plavix initiated in 2006  . PAD (peripheral artery disease) ([facility])    Right SFA occlusion, severe disease left CFA and SFA    Past Surgical History:  Procedure Laterality Date  . APPENDECTOMY     at hysterectomy and USO for fibroids, Dr. Ysidro Evert  . CARDIAC CATHETERIZATION  1992   Dr Eustace Quail  . CARDIAC CATHETERIZATION N/A 09/06/2015    Procedure: Right/Left Heart Cath and Coronary Angiography;  Surgeon: Burnell Blanks, MD;  Location: [place] [facility];  Service: Cardiovascular;  Laterality: N/A;  . CARDIAC CATHETERIZATION N/A 09/07/2015   Procedure: Coronary Stent Intervention;  Surgeon: Burnell Blanks, MD;  Location: [place] [facility];  Service: Cardiovascular;  Laterality: N/A;  . COLONOSCOPY     negative; 2008, Dr. Delfin Edis  . fracture LLE     '94; pinned  . IR ANGIO INTRA EXTRACRAN SEL COM CAROTID INNOMINATE BILAT MOD SED  03/02/2017  . IR ANGIO VERTEBRAL SEL VERTEBRAL UNI R MOD SED  03/02/2017  . RADIOLOGY WITH ANESTHESIA N/A 03/02/2017   Procedure: RADIOLOGY WITH ANESTHESIA;  Surgeon: Luanne Bras, MD;  Location: [place];  Service: Radiology;  Laterality: N/A;  . TONSILLECTOMY    . TOTAL ABDOMINAL HYSTERECTOMY     & BSO for fibroids    Social History   Socioeconomic History  . Marital status: Married    Spouse name: [name]  . Number of children: 1  . Years of education: college  . Highest education level: None  Social Needs  . Financial  resource strain: None  . Food insecurity - worry: None  . Food insecurity - inability: None  . Transportation needs - medical: None  . Transportation needs - non-medical: None  Occupational History  . Occupation: Pharmacist, hospital    Comment: Retired  Tobacco Use  . Smoking status: Former Smoker    Last attempt to quit: 03/11/1990    Years since quitting: 27.0  . Smokeless tobacco: Never Used  . Tobacco comment: smoked [place], up to < 5 cigarettes  Substance and Sexual Activity  . Alcohol use: No  . Drug use: No  . Sexual activity: None  Other Topics Concern  . 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 08/31/09- designated party form signed appointing, husband Brinly Maietta; ok to leave msg on home phone [phone].      Caffeine Small Amount one cup very rare.   Right handed.   One  child- [name]    Family History  Problem Relation Age of Onset  . Diabetes Mother   . Hypertension Mother   . Stroke Brother        ?> 55  . Coronary artery disease Brother        stent in 54s  . Cancer Neg Hx     Review of Systems  Constitutional: Negative for appetite change, chills and fever.  HENT: Negative for postnasal drip and trouble swallowing.   Respiratory: Positive for cough (at night) and shortness of breath (SOB with activity). Negative for wheezing.   Cardiovascular: Positive for leg swelling. Negative for chest pain and palpitations.  Gastrointestinal:       No gerd  Genitourinary: Positive for frequency. Negative for dysuria.  Musculoskeletal: Positive for back pain.  Neurological: Positive for speech difficulty. Negative for dizziness, light-headedness, numbness and headaches.       Objective:   Vitals:   03/31/17 1308  BP: (!) 150/92  Pulse: 89  Resp: 16  Temp: 97.7 F (36.5 C)  SpO2: 95%   Wt Readings from Last 3 Encounters:  03/31/17 206 lb (93.4 kg)  03/06/17 224 lb 10.4 oz (101.9 kg)  03/03/17 222 lb 0.1 oz (100.7 kg)   Body mass index is 33.25 kg/m.   Physical Exam    Constitutional: Appears well-developed and well-nourished. No distress.  HENT:  Head: Normocephalic and atraumatic.  Neck: Neck supple. No tracheal deviation present. No thyromegaly present.  No cervical lymphadenopathy Cardiovascular: Normal rate, regular rhythm and normal heart sounds.   2/6 systolic murmur heard. No carotid bruit .  1 + non pitting b/l le edema Pulmonary/Chest: Effort normal and breath sounds normal. No respiratory distress. No has no wheezes. No rales.  Abdomen: soft, non tender, non distended Neuro: 4/5 strength all extremities Skin: Skin is warm and dry. Not diaphoretic.  Psychiatric: Normal mood and affect. Behavior is normal.    SIGNIFICANT DIAGNOSTIC STUDIES Cerebral Angiogram - Dr Estanislado Pandy SUBJECTIVE:/P bilatera;l common carotid  arteriogram RT CFA approach. Findings. 1.Slowly recanalizing ant parietal branch of inf division Of Lt MCA M2/M3 junction. 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 Loss of flow related enhancement of a proximal left M2 branch, suspicious for occlusion.  2. Otherwise negative CTA for large vessel occlusion.  3. Occluded left vertebral artery.Dominant right vertebral artery patent to the vertebrobasilar junction.  4. Moderate carotid siphon atherosclerosis  with moderate multifocal stenoses.  5. Carotid bifurcation atherosclerosis without flow-limiting stenosis.   Ct Cerebral Perfusion W Contrast 03/02/2017 IMPRESSION:  Perfusion mismatch volume of 24 mL, corresponding to CT abnormality of the LEFT MCA branch subserving the LEFT frontal cortex and subcortical white matter. No core infarct volume is established.   Dg Chest Port 1 View 03/03/2017 IMPRESSION:  Cardiomegaly without edema or consolidation. There is aortic atherosclerosis. Aortic Atherosclerosis   Ct Head Code Stroke Wo Contrast 03/02/2017 IMPRESSION:  1. No acute intracranial infarct or other process identified.  2. ASPECTS is 10.  3. Multiple remote lacunar infarcts involving the bilateral basal ganglia, right cerebral hemisphere, and bilateral cerebellar hemispheresas above.  4. Atrophy with chronic small vessel ischemic disease.  5. 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   Transthoracic EchocardiogramComplete Bubble Study - Left ventricle: There is a false tendon in the mid LV of no clinical significance. LVF appears reduced but poor acoustical windows prevent accurate assessment of EF or 03/03/2017  S /P bilatera;l common carotid arteriogram. RT CFA approach. Findings. 1.Slowly recanalizing ant parietal branch of inf division Of Lt MCA M2/M3  junction. 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 2. Old RIGHT basal ganglia infarct, likely hemorrhagic etiology. 3. Numerous old small and large vessel infarcts including small bifrontal/MCA territory infarcts     Assessment & Plan:    See Problem List for Assessment and Plan of chronic medical problems.

## 2017-03-30 NOTE — Patient Instructions (Addendum)
   Medications reviewed and updated.  Changes include restarting amlodipine 2.5 mg daily.  Your prescription(s) have been submitted to your pharmacy. Please take as directed and contact our office if you believe you are having problem(s) with the medication(s).  A referral was ordered for home health - PT, ST, OT  Please followup in 3 months

## 2017-03-31 ENCOUNTER — Encounter: Payer: Self-pay | Admitting: Internal Medicine

## 2017-03-31 ENCOUNTER — Telehealth: Payer: Self-pay

## 2017-03-31 ENCOUNTER — Ambulatory Visit (INDEPENDENT_AMBULATORY_CARE_PROVIDER_SITE_OTHER): Payer: Medicare HMO | Admitting: Internal Medicine

## 2017-03-31 VITALS — BP 150/92 | HR 89 | Temp 97.7°F | Resp 16 | Wt 206.0 lb

## 2017-03-31 DIAGNOSIS — R0609 Other forms of dyspnea: Secondary | ICD-10-CM

## 2017-03-31 DIAGNOSIS — R2681 Unsteadiness on feet: Secondary | ICD-10-CM | POA: Diagnosis not present

## 2017-03-31 DIAGNOSIS — I69398 Other sequelae of cerebral infarction: Secondary | ICD-10-CM

## 2017-03-31 DIAGNOSIS — I69320 Aphasia following cerebral infarction: Secondary | ICD-10-CM | POA: Diagnosis not present

## 2017-03-31 DIAGNOSIS — I10 Essential (primary) hypertension: Secondary | ICD-10-CM | POA: Diagnosis not present

## 2017-03-31 DIAGNOSIS — E038 Other specified hypothyroidism: Secondary | ICD-10-CM | POA: Diagnosis not present

## 2017-03-31 DIAGNOSIS — I63512 Cerebral infarction due to unspecified occlusion or stenosis of left middle cerebral artery: Secondary | ICD-10-CM

## 2017-03-31 MED ORDER — AMLODIPINE BESYLATE 2.5 MG PO TABS: 2.5000 mg | ORAL_TABLET | Freq: Every day | ORAL | 3 refills | Status: DC

## 2017-03-31 NOTE — Telephone Encounter (Signed)
elvyra PT [facility]. called today, requesting patients start up for physical therapy be moved by 1 day, called her back, no answer, could not verify if that is a confidential voicemail box so left message to return call for orders.

## 2017-03-31 NOTE — Assessment & Plan Note (Signed)
Mild deficit Will order ST

## 2017-03-31 NOTE — Assessment & Plan Note (Signed)
PT ordered Using walker, but needs reminder to use it

## 2017-03-31 NOTE — Assessment & Plan Note (Signed)
Will schedule neuro f/u Continue eliquis, lipitor Will add BP medication for better BP control PT, OT, ST Using walker

## 2017-03-31 NOTE — Assessment & Plan Note (Signed)
Chronic ? Worse now Likely multifactorial  Her CHF, CAD is likely the main reason, but her obesity and deconditioning are likely contributing Her family will discuss with cardiology

## 2017-03-31 NOTE — Assessment & Plan Note (Signed)
Continue current dose of levothyroxine.

## 2017-03-31 NOTE — Assessment & Plan Note (Signed)
Not controlled Continue current medication Restart amlodipine 2.5 mg daily

## 2017-04-01 ENCOUNTER — Telehealth: Payer: Self-pay | Admitting: Internal Medicine

## 2017-04-01 DIAGNOSIS — I10 Essential (primary) hypertension: Secondary | ICD-10-CM | POA: Diagnosis not present

## 2017-04-01 DIAGNOSIS — Z7901 Long term (current) use of anticoagulants: Secondary | ICD-10-CM | POA: Diagnosis not present

## 2017-04-01 DIAGNOSIS — Z9181 History of falling: Secondary | ICD-10-CM | POA: Diagnosis not present

## 2017-04-01 DIAGNOSIS — N390 Urinary tract infection, site not specified: Secondary | ICD-10-CM | POA: Diagnosis not present

## 2017-04-01 DIAGNOSIS — I69351 Hemiplegia and hemiparesis following cerebral infarction affecting right dominant side: Secondary | ICD-10-CM | POA: Diagnosis not present

## 2017-04-01 NOTE — Telephone Encounter (Signed)
LVM giving verbal orders per MD.  

## 2017-04-01 NOTE — Telephone Encounter (Signed)
Copied from CRM [id number]. Topic: General - Other >> Apr 01, 2017  1:21 PM Percival Spanish wrote: Nino Glow a PT with [facility] call to ask for verbal orders home health pt visit  2 x 5   [phone]

## 2017-04-02 ENCOUNTER — Telehealth: Payer: Self-pay | Admitting: Internal Medicine

## 2017-04-02 NOTE — Telephone Encounter (Signed)
Copied from CRM [id number]. Topic: Quick Communication - See Telephone Encounter >> Apr 02, 2017  8:59 AM Rudi Coco, NT wrote: CRM for notification. See Telephone encounter for:   04/02/17. [facility]my from [facility] calling to get verbal orders for speech therapy 3 x 2 and 2 x 4 tammy can be reached at [phone] can leave vm.

## 2017-04-02 NOTE — Telephone Encounter (Signed)
LVM with Tammy giving verbal orders per MD

## 2017-04-03 DIAGNOSIS — Z9181 History of falling: Secondary | ICD-10-CM | POA: Diagnosis not present

## 2017-04-03 DIAGNOSIS — Z7901 Long term (current) use of anticoagulants: Secondary | ICD-10-CM | POA: Diagnosis not present

## 2017-04-03 DIAGNOSIS — N390 Urinary tract infection, site not specified: Secondary | ICD-10-CM | POA: Diagnosis not present

## 2017-04-03 DIAGNOSIS — I10 Essential (primary) hypertension: Secondary | ICD-10-CM | POA: Diagnosis not present

## 2017-04-03 DIAGNOSIS — I69351 Hemiplegia and hemiparesis following cerebral infarction affecting right dominant side: Secondary | ICD-10-CM | POA: Diagnosis not present

## 2017-04-04 ENCOUNTER — Telehealth: Payer: Self-pay | Admitting: Internal Medicine

## 2017-04-04 DIAGNOSIS — Z9181 History of falling: Secondary | ICD-10-CM | POA: Diagnosis not present

## 2017-04-04 DIAGNOSIS — I69351 Hemiplegia and hemiparesis following cerebral infarction affecting right dominant side: Secondary | ICD-10-CM | POA: Diagnosis not present

## 2017-04-04 DIAGNOSIS — I10 Essential (primary) hypertension: Secondary | ICD-10-CM | POA: Diagnosis not present

## 2017-04-04 DIAGNOSIS — Z7901 Long term (current) use of anticoagulants: Secondary | ICD-10-CM | POA: Diagnosis not present

## 2017-04-04 DIAGNOSIS — N390 Urinary tract infection, site not specified: Secondary | ICD-10-CM | POA: Diagnosis not present

## 2017-04-04 NOTE — Telephone Encounter (Signed)
Copied from CRM [id number]. Topic: Quick Communication - See Telephone Encounter >> Apr 04, 2017  2:22 PM Terisa Starr wrote: CRM for notification. See Telephone encounter for:   04/04/17.  Almira from [facility] called and requested a HH aide per the son, to assit with bathing.  Call back is [phone]

## 2017-04-04 NOTE — Telephone Encounter (Signed)
LVM with Yvonne Bailey giving verbal orders per MD for nurse aid

## 2017-04-05 DIAGNOSIS — Z7901 Long term (current) use of anticoagulants: Secondary | ICD-10-CM | POA: Diagnosis not present

## 2017-04-05 DIAGNOSIS — Z9181 History of falling: Secondary | ICD-10-CM | POA: Diagnosis not present

## 2017-04-05 DIAGNOSIS — I10 Essential (primary) hypertension: Secondary | ICD-10-CM | POA: Diagnosis not present

## 2017-04-05 DIAGNOSIS — I69351 Hemiplegia and hemiparesis following cerebral infarction affecting right dominant side: Secondary | ICD-10-CM | POA: Diagnosis not present

## 2017-04-05 DIAGNOSIS — N390 Urinary tract infection, site not specified: Secondary | ICD-10-CM | POA: Diagnosis not present

## 2017-04-07 ENCOUNTER — Other Ambulatory Visit: Payer: Self-pay | Admitting: Cardiovascular Disease

## 2017-04-07 DIAGNOSIS — Z9181 History of falling: Secondary | ICD-10-CM | POA: Diagnosis not present

## 2017-04-07 DIAGNOSIS — I10 Essential (primary) hypertension: Secondary | ICD-10-CM | POA: Diagnosis not present

## 2017-04-07 DIAGNOSIS — Z7901 Long term (current) use of anticoagulants: Secondary | ICD-10-CM | POA: Diagnosis not present

## 2017-04-07 DIAGNOSIS — I69351 Hemiplegia and hemiparesis following cerebral infarction affecting right dominant side: Secondary | ICD-10-CM | POA: Diagnosis not present

## 2017-04-07 DIAGNOSIS — N390 Urinary tract infection, site not specified: Secondary | ICD-10-CM | POA: Diagnosis not present

## 2017-04-08 ENCOUNTER — Telehealth: Payer: Self-pay | Admitting: Internal Medicine

## 2017-04-08 ENCOUNTER — Ambulatory Visit: Payer: Medicare HMO | Admitting: Gastroenterology

## 2017-04-08 ENCOUNTER — Other Ambulatory Visit (HOSPITAL_COMMUNITY): Payer: Self-pay | Admitting: Interventional Radiology

## 2017-04-08 DIAGNOSIS — Z7901 Long term (current) use of anticoagulants: Secondary | ICD-10-CM | POA: Diagnosis not present

## 2017-04-08 DIAGNOSIS — N390 Urinary tract infection, site not specified: Secondary | ICD-10-CM | POA: Diagnosis not present

## 2017-04-08 DIAGNOSIS — I63512 Cerebral infarction due to unspecified occlusion or stenosis of left middle cerebral artery: Secondary | ICD-10-CM

## 2017-04-08 DIAGNOSIS — I10 Essential (primary) hypertension: Secondary | ICD-10-CM | POA: Diagnosis not present

## 2017-04-08 DIAGNOSIS — I69351 Hemiplegia and hemiparesis following cerebral infarction affecting right dominant side: Secondary | ICD-10-CM | POA: Diagnosis not present

## 2017-04-08 DIAGNOSIS — Z9181 History of falling: Secondary | ICD-10-CM | POA: Diagnosis not present

## 2017-04-08 DIAGNOSIS — I729 Aneurysm of unspecified site: Secondary | ICD-10-CM

## 2017-04-08 NOTE — Telephone Encounter (Signed)
Copied from CRM [id number]. Topic: Quick Communication - See Telephone Encounter >> Apr 08, 2017  2:32 PM Arlyss Gandy, NT wrote: CRM for notification. See Telephone encounter for: Pts son calling and wanting to see if the dr can help him get his mom placed in a inpatient facility temporarily for more therapy since her stroke. She has been home for a month and still struggling with ADLs and he states the medications she has to take and then trying to help assist her at home while he also needs to work that she needs more care than he can provide at this time. The family is under a lot of stress including the pt with trying to adjust to her new lifestyle due to the stroke. Please call son.  04/08/17.

## 2017-04-08 NOTE — Telephone Encounter (Signed)
Let her son know that I put in a referral for a [facility] social work-they will contact him to set up an appointment to discuss options based on her insurance.

## 2017-04-09 DIAGNOSIS — Z7901 Long term (current) use of anticoagulants: Secondary | ICD-10-CM | POA: Diagnosis not present

## 2017-04-09 DIAGNOSIS — Z9181 History of falling: Secondary | ICD-10-CM | POA: Diagnosis not present

## 2017-04-09 DIAGNOSIS — I69351 Hemiplegia and hemiparesis following cerebral infarction affecting right dominant side: Secondary | ICD-10-CM | POA: Diagnosis not present

## 2017-04-09 DIAGNOSIS — I10 Essential (primary) hypertension: Secondary | ICD-10-CM | POA: Diagnosis not present

## 2017-04-09 DIAGNOSIS — N390 Urinary tract infection, site not specified: Secondary | ICD-10-CM | POA: Diagnosis not present

## 2017-04-09 NOTE — Telephone Encounter (Signed)
Spoke with pts son, advised it could take up to a week to have appt scheduled. Gave son number to [facility]

## 2017-04-10 DIAGNOSIS — I10 Essential (primary) hypertension: Secondary | ICD-10-CM | POA: Diagnosis not present

## 2017-04-10 DIAGNOSIS — Z9181 History of falling: Secondary | ICD-10-CM | POA: Diagnosis not present

## 2017-04-10 DIAGNOSIS — Z7901 Long term (current) use of anticoagulants: Secondary | ICD-10-CM | POA: Diagnosis not present

## 2017-04-10 DIAGNOSIS — N390 Urinary tract infection, site not specified: Secondary | ICD-10-CM | POA: Diagnosis not present

## 2017-04-10 DIAGNOSIS — I69351 Hemiplegia and hemiparesis following cerebral infarction affecting right dominant side: Secondary | ICD-10-CM | POA: Diagnosis not present

## 2017-04-11 ENCOUNTER — Other Ambulatory Visit: Payer: Self-pay | Admitting: *Deleted

## 2017-04-11 NOTE — Patient Outreach (Addendum)
[facility]) Care Management  [facility] Care Manager  04/11/2017   Yvonne Bailey 04/16/1940 [id number]  Referral received :  1/30 Referral source: MD office , Dr.Burns  Referral reason : Recent stroke , social work for possible. rehab post recent stroke.  Insurance: [employer]   Initial outreach call to patient, person answering phone identified self as Dawayne Patricia, Granddaughter/caregiver of  patient, listed on  emergency contact list, HIPAA information verified, able to speak with patient , explained reason for the call, [facility] care management services, HIPAA verified, patient gives verbal consent to [facility] services as well as speaking with her granddaughter for assessments.   Patient reports feeling pretty good on today, currently resting in her chair.  Discussed with granddaughter patient recent medical conditions and concerns.   Recent Stroke  Patient discharged from inpatient rehab on 1/8, and followed by [facility] for physical therapy, occupational therapy and speech therapy.  Patient currently has grandaughter staying with her for 24 hour care at this time but has concern regarding ongoing needs.  Caregiver states patient is making good progress with ambulation, using a rolling walker. Granddaughter discussed concern regarding progress with some ADL's activities even with continued home health she struggles   with self care after using the bathroom , patient needs helps with wiping self. She does have some incontinence and wears depends. Patient is right handed and has right sided weakness as result of stroke as well as some aphasia.  Granddaughter states she and her father ( patients son ) as well as patient  wants to know more about options for continued rehab on a  daily basis, for short term  basis to focus on getting her strength back.   Heart Failure Patient able to weigh daily with family assistance, today's weight is 218 patient recent weights have been  in this same range. Patient denies any shortness of breath at present, or swelling.  Caregiver is not familiar with heart failure zone information.   Hypertension Patient does not monitor blood pressure in home, watches salt in diet.   Medications Family members reports assisting patient with daily medications and uses a pill organizer.  Patient/family denies cost concerns related to medications.  Patient was recently discharged from hospital and all medications have been reviewed.  Patient has attended post discharge visit with PCP on 1/21, and with Rehab MD Dr.Patel on  1/17 and next visit on 2/5.  Encounter Medications:  Outpatient Encounter Medications as of 04/11/2017  Medication Sig  . amLODipine (NORVASC) 2.5 MG tablet Take 1 tablet (2.5 mg total) by mouth daily.  Marland Kitchen atorvastatin (LIPITOR) 80 MG tablet Take 1 tablet (80 mg total) by mouth daily.  . benazepril (LOTENSIN) 20 MG tablet Take 1.5 tablets (30 mg total) by mouth daily.  Marland Kitchen ELIQUIS 5 MG TABS tablet TAKE 1 TABLET TWICE DAILY  . levothyroxine (SYNTHROID, LEVOTHROID) 100 MCG tablet Take 100 mcg by mouth daily before breakfast.  . metoprolol succinate (TOPROL-XL) 50 MG 24 hr tablet Take 1 tablet (50 mg total) by mouth 2 (two) times daily. Take with or immediately following a meal.  . nitroGLYCERIN (NITROSTAT) 0.4 MG SL tablet Place 1 tablet (0.4 mg total) under the tongue every 5 (five) minutes as needed for chest pain.  Marland Kitchen spironolactone (ALDACTONE) 25 MG tablet Take 1 tablet (25 mg total) by mouth daily.   No facility-administered encounter medications on file as of 04/11/2017.     Assessment:  Patient has been discharged from inpatient  rehab facility within the last 30 days, she has Chronic conditions of Heart Failure, Hypertension, Atrial Fib,  she has given verbal consent to [facility] care management services. Patient will benefit from referral to [facility] care coordinator for transition of care needs, in home assessment, she will  benefit from LCSW referral for follow up on  community resources and further assessment of needs.  Initial transition of care call  template completed.   Plan:  Will place referral to [facility] care Coordinator Will place referral to LCSW.  Will notify CMA that case status is active.    Egbert Garibaldi, RN, [facility] Care Management,Care Management Coordinator  [phone]- Mobile [phone]- Toll Free Main Office

## 2017-04-14 ENCOUNTER — Other Ambulatory Visit: Payer: Self-pay | Admitting: *Deleted

## 2017-04-14 ENCOUNTER — Encounter: Payer: Self-pay | Admitting: *Deleted

## 2017-04-14 NOTE — Patient Outreach (Signed)
[facility]) Care Management  04/14/2017  CASIDEE BRANSON 1940-12-14 [id number]    Initial transition of care contact unsuccessful however RN able to leave a HIPAA approved voice message requesting a call back. Will further address pt's needs at that time.   Elliot Cousin, RN Care Management Coordinator [facility] Main Office [phone]

## 2017-04-14 NOTE — Patient Outreach (Signed)
[facility]) Care Management  04/14/2017  [name] [birth year] [id number]   Incoming call from [name], identified self as son of patient Yvonne Bailey, HIPAA identifier information of name,  DOB and address obtained.  [name] calling to discuss plan regarding his mom and  additional rehab concerns, reports he received a call from someone at  [facility] office that they had gotten approval for further inpatient rehab for patient. He states he has tried to contact number and has only been able to leave a message at [phone].   Will update assigned RN care manager, Elliot Cousin of this incoming call for follow up.  Michelene Heady provided patients' best contact number as well as his , updated in Epic.  Egbert Garibaldi, RN, [facility] Care Management,Care Management Coordinator  [phone]- Mobile [phone]- Toll Free Main Office

## 2017-04-14 NOTE — Patient Outreach (Signed)
[facility]) Care Management  04/14/2017  Yvonne Bailey 02-Mar-1941 [id number]  [facility] contact Sharyl Nimrod)  RN spoke with the involved [facility] agency ([facility]) concerning services eing provided to this pt and the recent referral from the primary provider requested possible SNF placement. Agency indicated the nurse would address the issue directly with the pt and family and assist with this process. [facility] RN informed the involved agency that this nurse and social work will remain available to assist further in the process if needed however will continue to complete a transition of care and offer any other assistance as pt continues to recover. [facility] nurse indicated pt would be called concerning this matter. RN also updated [facility] LCSW that [facility] would again address this matter directly with the pt/family.    RN will contact the pt and complete a transition of care along with addressing any needs as referred. Will also update the involved social worker via [facility] accordingly.  Elliot Cousin, RN Care Management Coordinator [facility] Main Office [phone]

## 2017-04-15 ENCOUNTER — Encounter: Payer: Self-pay | Admitting: *Deleted

## 2017-04-15 ENCOUNTER — Telehealth: Payer: Self-pay | Admitting: Emergency Medicine

## 2017-04-15 ENCOUNTER — Other Ambulatory Visit: Payer: Self-pay | Admitting: *Deleted

## 2017-04-15 DIAGNOSIS — N390 Urinary tract infection, site not specified: Secondary | ICD-10-CM | POA: Diagnosis not present

## 2017-04-15 DIAGNOSIS — I10 Essential (primary) hypertension: Secondary | ICD-10-CM | POA: Diagnosis not present

## 2017-04-15 DIAGNOSIS — Z9181 History of falling: Secondary | ICD-10-CM | POA: Diagnosis not present

## 2017-04-15 DIAGNOSIS — Z7901 Long term (current) use of anticoagulants: Secondary | ICD-10-CM | POA: Diagnosis not present

## 2017-04-15 DIAGNOSIS — I69351 Hemiplegia and hemiparesis following cerebral infarction affecting right dominant side: Secondary | ICD-10-CM | POA: Diagnosis not present

## 2017-04-15 NOTE — Telephone Encounter (Signed)
Copied from CRM [id number]. Topic: Quick Communication - See Telephone Encounter >> Apr 15, 2017  1:54 PM Clack, [name] wrote: CRM for notification. See Telephone encounter for: Misty Stanley with pt outreach wanted to let the provider know the social worker would be faxing over [place] paperwork. Also the nurse would be available but the son feels the nurse is not needed and the social worker will be working with the pt.  Contact [phone]  04/15/17.

## 2017-04-15 NOTE — Patient Outreach (Addendum)
[facility]) Care Management  04/15/2017  Yvonne Bailey October 19, 1940 [id number]  Transition of care   RN spoke with pt today and introduced the [facility] program and services. RN also verified identifiers and received permission to speak with her son Yvonne Bailey). RN contacted son and introduced the [facility] program and purpose for today's call. Son indicated he received a call from the provider's office on pt's being approved via inpt rehab and provided a contact number and that he would received a call from an agency in the "[facility] network". RN later found that the contact number pt provider with does not provider pt with further assistance with placing his mother. RN discussed the process for completing a FL2 form that the provider's office (Dr. Lawerance Bach) has to complete prior to the faxing to available agencies that may have a bed available. Caregiver son Yvonne Bailey) with a clearer understanding and not sure what he needs to do at this time. Based upon today's discussed RN has informed the caregiver that the social worker will await available of the complete FL2 form and contact him with the available beds at the facility who in term will received confirmation on pt's approval for rehabilitation. RN also inquired on pt's medical issues concerning her HTN-controlled, HF-controlled and Atrial Fibrillation-controlled with no issues to address at this time. Caregiver son indicated the only issues is pt to be placed for ongoingrehabilitation and assistance needed with her ongoing ADLs. RN inquired on the plan when pt returns home from the rehab on a good support system in the home to continue pt's mobility and therapy. Son indicated some of his family would be available but could not adhere available help for the pt 24 hours a day. RN discussed sitter and aide services however this maybe an out-of-pocket expense for the family and pt (son verbalized an understanding). Pt does not have MCD for  additional services in the home. RN offered any other nursing services to assist pt further however none needed at this time.  I  will update the involved social worker Water engineer) and contact Dr. Pat Kocher office with an update pt has no nursing needs at this time however will remain available during the process as the social worker via [facility] will present the [facility] via fax to the provider's office over the next few days for completion in starting the process for available beds. Pt is aware that the approval for placement will come from the pt's insurance once present by the appropriate party not [facility] (son verbalized an understanding). Will generate a plan of care/goal and interventions related to available assistance within the home once pt is discharged to assist with caring for the pt at that time and pt safety while in the home until placed in rehab. Will strongly encouraged son to discuss all options with the pt who will need to agree with this plan. Will follow up next week with ongoing transition of care contacts accordingly.  Elliot Cousin, RN Care Management Coordinator [facility] Main Office [phone]

## 2017-04-16 ENCOUNTER — Other Ambulatory Visit: Payer: Self-pay | Admitting: *Deleted

## 2017-04-16 DIAGNOSIS — I69351 Hemiplegia and hemiparesis following cerebral infarction affecting right dominant side: Secondary | ICD-10-CM | POA: Diagnosis not present

## 2017-04-16 DIAGNOSIS — I10 Essential (primary) hypertension: Secondary | ICD-10-CM | POA: Diagnosis not present

## 2017-04-16 DIAGNOSIS — Z7901 Long term (current) use of anticoagulants: Secondary | ICD-10-CM | POA: Diagnosis not present

## 2017-04-16 DIAGNOSIS — N390 Urinary tract infection, site not specified: Secondary | ICD-10-CM | POA: Diagnosis not present

## 2017-04-16 DIAGNOSIS — Z9181 History of falling: Secondary | ICD-10-CM | POA: Diagnosis not present

## 2017-04-16 NOTE — Patient Outreach (Signed)
[facility]) Care Management  04/16/2017  Yvonne Bailey 03/02/1941 [id number]   Phone call to patient's doctor's office. FL2 requested to be completed by patient's providers office. Form faxed to [phone].    Adriana Reams [facility] Care Management [phone]

## 2017-04-16 NOTE — Patient Outreach (Signed)
[facility]) Care Management  04/16/2017  Yvonne Bailey 31-Oct-1940 [id number]  Patient referred to this social worker due to concerns from her son that patient is not progressing well at home and is requesting assistance with returning back to a SNF for additional rehab. Phone call to patient, on 04/15/17 [facility] care management services discussed. Patient requested that this social worker speak to her son regarding [facility] care management involvement and return to rehab.   Phone call to patient's son who voiced concern that patient is having trouble completing her ADL's specifically around toileting and feels that she may need to return to SNF. Per patient's son, his daughter assist with patient's care, however she will not be able to continue this full time. Per patient's son, he was told that patient was approved for 3-6 weeks of inpatient rehab and was given the number for the practice relations manager Yvonne Bailey for follow up by his doctor's office. Per patient's son, he has called her and left several messages. This social worker was able to confirm that Yvonne Bailey's contact information was given in error and as Practice Relations she would not handle ALF authorizations. This Child psychotherapist discussed the process if patient were to return to a SNF including the FL2 need and insurance approval. The possibility of hiring a in home aid discussed. Per patient's son, he would like to pursue patient returning to SNF   Plan: This Child psychotherapist will contact [facility] to discuss PT assessment completed and their recommendations.   Yvonne Bailey [facility] Care Management [phone]

## 2017-04-16 NOTE — Patient Outreach (Signed)
[facility]) Care Management  04/16/2017  Yvonne Bailey December 03, 1940 [id number]   Phone call on 04/15/17 to PT trough [facility], Al[place]ira who states that she was not aware of patient's son's concerns and would discuss the concerns with her supervisor to determine next steps. Per Nino Glow, she completed the initial assessment and stated that patient will have a bath aid as well as OT who will educate patient and her caregiver on toileting. She did not confirm recommendation of returning to a ALF. This Child psychotherapist to follow up with [facility] RNCM and will request FL2 form from patient's doctor.      Adriana Reams [facility] Care Management [phone]

## 2017-04-17 ENCOUNTER — Encounter: Payer: Medicare HMO | Admitting: Physical Medicine & Rehabilitation

## 2017-04-17 ENCOUNTER — Encounter: Payer: Self-pay | Admitting: *Deleted

## 2017-04-17 ENCOUNTER — Telehealth: Payer: Self-pay | Admitting: Emergency Medicine

## 2017-04-17 NOTE — Telephone Encounter (Signed)
Copied from CRM [id number]. Topic: General - Other >> Apr 16, 2017  3:11 PM Oneal Grout wrote: Reason for CRM: Needing FL2 completed and sent back to [facility] [phone]. Will fax over form today. >> Apr 17, 2017 12:22 PM Marquis Buggy A wrote: Fax received and placed in MD's box.

## 2017-04-18 ENCOUNTER — Encounter: Payer: Self-pay | Admitting: Physical Medicine & Rehabilitation

## 2017-04-18 ENCOUNTER — Encounter: Payer: Medicare HMO | Attending: Physical Medicine & Rehabilitation | Admitting: Physical Medicine & Rehabilitation

## 2017-04-18 ENCOUNTER — Ambulatory Visit: Payer: Medicare HMO | Admitting: *Deleted

## 2017-04-18 VITALS — BP 150/79 | HR 80

## 2017-04-18 DIAGNOSIS — R32 Unspecified urinary incontinence: Secondary | ICD-10-CM | POA: Diagnosis not present

## 2017-04-18 DIAGNOSIS — I252 Old myocardial infarction: Secondary | ICD-10-CM | POA: Insufficient documentation

## 2017-04-18 DIAGNOSIS — I255 Ischemic cardiomyopathy: Secondary | ICD-10-CM | POA: Diagnosis not present

## 2017-04-18 DIAGNOSIS — I69320 Aphasia following cerebral infarction: Secondary | ICD-10-CM | POA: Insufficient documentation

## 2017-04-18 DIAGNOSIS — Z86718 Personal history of other venous thrombosis and embolism: Secondary | ICD-10-CM | POA: Diagnosis not present

## 2017-04-18 DIAGNOSIS — Z833 Family history of diabetes mellitus: Secondary | ICD-10-CM | POA: Diagnosis not present

## 2017-04-18 DIAGNOSIS — Z90722 Acquired absence of ovaries, bilateral: Secondary | ICD-10-CM | POA: Diagnosis not present

## 2017-04-18 DIAGNOSIS — I10 Essential (primary) hypertension: Secondary | ICD-10-CM | POA: Diagnosis not present

## 2017-04-18 DIAGNOSIS — E785 Hyperlipidemia, unspecified: Secondary | ICD-10-CM | POA: Insufficient documentation

## 2017-04-18 DIAGNOSIS — Z823 Family history of stroke: Secondary | ICD-10-CM | POA: Insufficient documentation

## 2017-04-18 DIAGNOSIS — Z87891 Personal history of nicotine dependence: Secondary | ICD-10-CM | POA: Diagnosis not present

## 2017-04-18 DIAGNOSIS — Z8249 Family history of ischemic heart disease and other diseases of the circulatory system: Secondary | ICD-10-CM | POA: Insufficient documentation

## 2017-04-18 DIAGNOSIS — Z9861 Coronary angioplasty status: Secondary | ICD-10-CM | POA: Diagnosis not present

## 2017-04-18 DIAGNOSIS — E039 Hypothyroidism, unspecified: Secondary | ICD-10-CM | POA: Insufficient documentation

## 2017-04-18 DIAGNOSIS — R269 Unspecified abnormalities of gait and mobility: Secondary | ICD-10-CM | POA: Diagnosis not present

## 2017-04-18 DIAGNOSIS — I6990 Unspecified sequelae of unspecified cerebrovascular disease: Secondary | ICD-10-CM | POA: Diagnosis not present

## 2017-04-18 DIAGNOSIS — Z7901 Long term (current) use of anticoagulants: Secondary | ICD-10-CM | POA: Diagnosis not present

## 2017-04-18 DIAGNOSIS — R350 Frequency of micturition: Secondary | ICD-10-CM | POA: Insufficient documentation

## 2017-04-18 DIAGNOSIS — I2510 Atherosclerotic heart disease of native coronary artery without angina pectoris: Secondary | ICD-10-CM | POA: Diagnosis not present

## 2017-04-18 DIAGNOSIS — I739 Peripheral vascular disease, unspecified: Secondary | ICD-10-CM | POA: Diagnosis not present

## 2017-04-18 DIAGNOSIS — Z90710 Acquired absence of both cervix and uterus: Secondary | ICD-10-CM | POA: Insufficient documentation

## 2017-04-18 DIAGNOSIS — R2681 Unsteadiness on feet: Secondary | ICD-10-CM | POA: Diagnosis not present

## 2017-04-18 DIAGNOSIS — R4701 Aphasia: Secondary | ICD-10-CM

## 2017-04-18 NOTE — Progress Notes (Signed)
Subjective:    Patient ID: Yvonne Bailey, female    DOB: Nov 02, 1940, 77 y.o.   MRN: [id number]  HPI 77 y.o. RH female with history of ICM/CAD, HTN, DVT-on Eliquis and question of medical compliance presents for follow up for MCA infarct.  Last clinic visit 03/27/17.  Presents with grand daughter, who provides much of history. Since that time, pt states she changes therapy companies and is still in therapies.  She no longer is wants another shower chair.   She scheduled an appointment with Neurology.  Her BP remains elevated.  She states it is usually only high at lunch.  Denies falls. Uses walker all times. Urinary symptoms have improved.   Pain Inventory Average Pain 0 Pain Right Now 0 My pain is no pain  In the last 24 hours, has pain interfered with the following? General activity 0 Relation with others 0 Enjoyment of life 0 What TIME of day is your pain at its worst? no pain Sleep (in general) Good  Pain is worse with: no pain Pain improves with: no pain Relief from Meds: no pain  Mobility walk with assistance use a walker do you drive?  no  Function retired  Neuro/Psych bladder control problems bowel control problems  Prior Studies Any changes since last visit?  no  Physicians involved in your care Any changes since last visit?  no   Family History  Problem Relation Age of Onset  . Diabetes Mother   . Hypertension Mother   . Stroke Brother        ?> 55  . Coronary artery disease Brother        stent in 69s  . Cancer Neg Hx    Social History   Socioeconomic History  . Marital status: Married    Spouse name: [name]  . Number of children: 1  . Years of education: college  . Highest education level: None  Social Needs  . Financial resource strain: None  . Food insecurity - worry: None  . Food insecurity - inability: None  . Transportation needs - medical: None  . Transportation needs - non-medical: None  Occupational History  . Occupation:  Pharmacist, hospital    Comment: Retired  Tobacco Use  . Smoking status: Former Smoker    Last attempt to quit: 03/11/1990    Years since quitting: 27.1  . Smokeless tobacco: Never Used  . Tobacco comment: smoked Maribel, up to < 5 cigarettes  Substance and Sexual Activity  . Alcohol use: No  . Drug use: No  . Sexual activity: None  Other Topics Concern  . 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 08/31/09- designated party form signed appointing, husband Refugia Laneve; ok to leave msg on home phone [phone].      Caffeine Small Amount one cup very rare.   Right handed.   One child- [name]   Past Surgical History:  Procedure Laterality Date  . APPENDECTOMY     at hysterectomy and USO for fibroids, Dr. Ysidro Evert  . CARDIAC CATHETERIZATION  1992   Dr Eustace Quail  . CARDIAC CATHETERIZATION N/A 09/06/2015   Procedure: Right/Left Heart Cath and Coronary Angiography;  Surgeon: Burnell Blanks, MD;  Location: [place] [facility];  Service: Cardiovascular;  Laterality: N/A;  . CARDIAC CATHETERIZATION N/A 09/07/2015   Procedure: Coronary Stent Intervention;  Surgeon: Burnell Blanks, MD;  Location: [place] [facility];  Service: Cardiovascular;  Laterality: N/A;  . COLONOSCOPY     negative; 2008, Dr. Delfin Edis  . fracture LLE     '94; pinned  . IR ANGIO INTRA EXTRACRAN SEL COM CAROTID INNOMINATE BILAT MOD SED  03/02/2017  . IR ANGIO VERTEBRAL SEL VERTEBRAL UNI R MOD SED  03/02/2017  . RADIOLOGY WITH ANESTHESIA N/A 03/02/2017   Procedure: RADIOLOGY WITH ANESTHESIA;  Surgeon: Luanne Bras, MD;  Location: [place];  Service: Radiology;  Laterality: N/A;  . TONSILLECTOMY    . TOTAL ABDOMINAL HYSTERECTOMY     & BSO for fibroids   Past Medical History:  Diagnosis Date  . CAD (coronary artery disease)    S/P  anterior  wall myocardial infarction in '92, treated w/percutaneous transluminal coronary angioplasty of the  left anterior descending. EF 45%  . Carotid artery disease ([place])   . DVT (deep venous thrombosis) ([place])    X1  . History of nuclear stress test    a. Myoview 6/17: EF 20-25%, mid anteroseptal, apical anterior, apical septal, apical inferior, apical lateral and apical scar, no ischemia, intermediate risk  . HTN (hypertension)   . Hyperlipidemia   . Hypothyroidism   . Ischemic cardiomyopathy    a. Echo 6/17: EF 20-25%, apex appears akinetic, MAC, moderate MR, moderate LAE, mild RVE, trivial PI, PASP 47 mmHg (needs repeat with Definity contrast) //  b. Limited echo with Definity contrast 7/17: EF 25-30%, moderate to severe LAE  . MI (myocardial infarction) ([place]) 1992   at age 25; TIA treated with coumadin until Plavix initiated in 2006  . PAD (peripheral artery disease) ([facility])    Right SFA occlusion, severe disease left CFA and SFA   BP (!) 150/79   Pulse 80   SpO2 98%   Opioid Risk Score:   Fall Risk Score:  `1  Depression screen PHQ 2/9  Depression screen [facility] 2/9 04/11/2017 02/04/2017 11/21/2016 01/12/2016 11/02/2015 10/09/2015 12/15/2014  Decreased Interest 0 0 0 0 0 0 0  Down, Depressed, Hopeless 0 0 0 0 0 0 0  PHQ - 2 Score 0 0 0 0 0 0 0  Altered sleeping - - 0 - - - -  Tired, decreased energy - - 0 - - - -  Change in appetite - - 0 - - - -  Feeling bad or failure about yourself  - - 0 - - - -  Trouble concentrating - - 0 - - - -  Moving slowly or fidgety/restless - - 0 - - - -  Suicidal thoughts - - 0 - - - -  PHQ-9 Score - - 0 - - - -  Difficult doing work/chores - - Not difficult at all - - - -  Some recent data might be hidden    Review of Systems  Constitutional: Negative.   HENT: Negative.   Eyes: Negative.   Respiratory: Negative.   Cardiovascular: Negative.   Gastrointestinal: Negative.   Endocrine: Negative.   Genitourinary: Negative.        Incontinence  Musculoskeletal: Positive for gait problem.  Skin: Negative.   Allergic/Immunologic: Negative.     Neurological: Positive for speech difficulty.  Hematological: Negative.   Psychiatric/Behavioral: Negative.   All other systems reviewed and are negative.     Objective:   Physical Exam Constitutional: She appears well-developed and well-nourished. No distress.  HENT: Normocephalic and atraumatic.  Eyes: EOM are normal. No discharge.  Cardiovascular: RRR. No JVD. Respiratory: CTA Bilaterally. Normal effort     GI: Bowel  sounds are normal. She exhibits no distension.  Musculoskeletal: She exhibits no edema or tenderness.  Neurological: She is alert and oriented.  Motor: 4+/5 throughout  Word finding deficits improving Expressive >>Receptive aphasia  Skin: Skin is warm and dry. She is not diaphoretic.  Psychiatric: She has a normal mood and affect. Her behavior is normal.     Assessment & Plan:  77 y.o. RH female with history of ICM/CAD, HTN, DVT-on Eliquis and question of medical compliance presents for follow up for left MCA infarct.  1. 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   Cont therapies  Follow up with Neurology  Cont Supervision  2. HTN  Remains elevated, pt states usually elevated at lunch time  Encouraged follow up with PCP for further adjustments  3. Gait abnormality  Cont therapies  Cont walker for safety

## 2017-04-21 ENCOUNTER — Other Ambulatory Visit: Payer: Self-pay | Admitting: *Deleted

## 2017-04-21 ENCOUNTER — Encounter: Payer: Self-pay | Admitting: Physician Assistant

## 2017-04-21 ENCOUNTER — Encounter: Payer: Self-pay | Admitting: *Deleted

## 2017-04-21 ENCOUNTER — Ambulatory Visit (INDEPENDENT_AMBULATORY_CARE_PROVIDER_SITE_OTHER): Payer: Medicare HMO | Admitting: Physician Assistant

## 2017-04-21 VITALS — BP 132/82 | HR 74 | Ht 66.0 in | Wt 220.4 lb

## 2017-04-21 DIAGNOSIS — N390 Urinary tract infection, site not specified: Secondary | ICD-10-CM | POA: Diagnosis not present

## 2017-04-21 DIAGNOSIS — Z7901 Long term (current) use of anticoagulants: Secondary | ICD-10-CM | POA: Diagnosis not present

## 2017-04-21 DIAGNOSIS — I4819 Other persistent atrial fibrillation: Secondary | ICD-10-CM

## 2017-04-21 DIAGNOSIS — I255 Ischemic cardiomyopathy: Secondary | ICD-10-CM

## 2017-04-21 DIAGNOSIS — I6300 Cerebral infarction due to thrombosis of unspecified precerebral artery: Secondary | ICD-10-CM

## 2017-04-21 DIAGNOSIS — I481 Persistent atrial fibrillation: Secondary | ICD-10-CM

## 2017-04-21 DIAGNOSIS — I5022 Chronic systolic (congestive) heart failure: Secondary | ICD-10-CM

## 2017-04-21 DIAGNOSIS — I2510 Atherosclerotic heart disease of native coronary artery without angina pectoris: Secondary | ICD-10-CM | POA: Diagnosis not present

## 2017-04-21 DIAGNOSIS — I10 Essential (primary) hypertension: Secondary | ICD-10-CM

## 2017-04-21 DIAGNOSIS — Z9181 History of falling: Secondary | ICD-10-CM | POA: Diagnosis not present

## 2017-04-21 DIAGNOSIS — I69351 Hemiplegia and hemiparesis following cerebral infarction affecting right dominant side: Secondary | ICD-10-CM | POA: Diagnosis not present

## 2017-04-21 MED ORDER — ASPIRIN EC 81 MG PO TBEC: 81.0000 mg | DELAYED_RELEASE_TABLET | Freq: Every day | ORAL | 3 refills | Status: DC

## 2017-04-21 NOTE — Patient Instructions (Addendum)
Medication Instructions:  Your physician has recommended you make the following change in your medication:  1.  START Aspirin 81 mg daily  Labwork: None ordered  Testing/Procedures: None ordered  Follow-Up: Your physician recommends that you schedule a follow-up appointment in: 6 WEEKS WITH DR. Clifton James (YOU CAN PUSH OUT YOUR LAB WORK UNTIL THEN IF YOU WOULD LIKE)   Any Other Special Instructions Will Be Listed Below (If Applicable).   Low-Sodium Eating Plan Sodium, which is an element that makes up salt, helps you maintain a healthy balance of fluids in your body. Too much sodium can increase your blood pressure and cause fluid and waste to be held in your body. Your health care provider or dietitian may recommend following this plan if you have high blood pressure (hypertension), kidney disease, liver disease, or heart failure. Eating less sodium can help lower your blood pressure, reduce swelling, and protect your heart, liver, and kidneys. What are tips for following this plan? General guidelines  Most people on this plan should limit their sodium intake to 1,500-2,000 mg (milligrams) of sodium each day. Reading food labels  The Nutrition Facts label lists the amount of sodium in one serving of the food. If you eat more than one serving, you must multiply the listed amount of sodium by the number of servings.  Choose foods with less than 140 mg of sodium per serving.  Avoid foods with 300 mg of sodium or more per serving. Shopping  Look for lower-sodium products, often labeled as "low-sodium" or "no salt added."  Always check the sodium content even if foods are labeled as "unsalted" or "no salt added".  Buy fresh foods. ? Avoid canned foods and premade or frozen meals. ? Avoid canned, cured, or processed meats  Buy breads that have less than 80 mg of sodium per slice. Cooking  Eat more home-cooked food and less restaurant, buffet, and fast food.  Avoid adding salt  when cooking. Use salt-free seasonings or herbs instead of table salt or sea salt. Check with your health care provider or pharmacist before using salt substitutes.  Cook with plant-based oils, such as canola, sunflower, or olive oil. Meal planning  When eating at a restaurant, ask that your food be prepared with less salt or no salt, if possible.  Avoid foods that contain MSG (monosodium glutamate). MSG is sometimes added to [place] food, bouillon, and some canned foods. What foods are recommended? The items listed may not be a complete list. Talk with your dietitian about what dietary choices are best for you. Grains Low-sodium cereals, including oats, puffed wheat and rice, and shredded wheat. Low-sodium crackers. Unsalted rice. Unsalted pasta. Low-sodium bread. Whole-grain breads and whole-grain pasta. Vegetables Fresh or frozen vegetables. "No salt added" canned vegetables. "No salt added" tomato sauce and paste. Low-sodium or reduced-sodium tomato and vegetable juice. Fruits Fresh, frozen, or canned fruit. Fruit juice. Meats and other protein foods Fresh or frozen (no salt added) meat, poultry, seafood, and fish. Low-sodium canned tuna and salmon. Unsalted nuts. Dried peas, beans, and lentils without added salt. Unsalted canned beans. Eggs. Unsalted nut butters. Dairy Milk. Soy milk. Cheese that is naturally low in sodium, such as ricotta cheese, fresh mozzarella, or Swiss cheese Low-sodium or reduced-sodium cheese. Cream cheese. Yogurt. Fats and oils Unsalted butter. Unsalted margarine with no trans fat. Vegetable oils such as canola or olive oils. Seasonings and other foods Fresh and dried herbs and spices. Salt-free seasonings. Low-sodium mustard and ketchup. Sodium-free salad dressing. Sodium-free light  mayonnaise. Fresh or refrigerated horseradish. Lemon juice. Vinegar. Homemade, reduced-sodium, or low-sodium soups. Unsalted popcorn and pretzels. Low-salt or salt-free chips. What  foods are not recommended? The items listed may not be a complete list. Talk with your dietitian about what dietary choices are best for you. Grains Instant hot cereals. Bread stuffing, pancake, and biscuit mixes. Croutons. Seasoned rice or pasta mixes. Noodle soup cups. Boxed or frozen macaroni and cheese. Regular salted crackers. Self-rising flour. Vegetables Sauerkraut, pickled vegetables, and relishes. Olives. [place] fries. Onion rings. Regular canned vegetables (not low-sodium or reduced-sodium). Regular canned tomato sauce and paste (not low-sodium or reduced-sodium). Regular tomato and vegetable juice (not low-sodium or reduced-sodium). Frozen vegetables in sauces. Meats and other protein foods Meat or fish that is salted, canned, smoked, spiced, or pickled. Bacon, ham, sausage, hotdogs, corned beef, chipped beef, packaged lunch meats, salt pork, jerky, pickled herring, anchovies, regular canned tuna, sardines, salted nuts. Dairy Processed cheese and cheese spreads. Cheese curds. Blue cheese. Feta cheese. String cheese. Regular cottage cheese. Buttermilk. Canned milk. Fats and oils Salted butter. Regular margarine. Ghee. Bacon fat. Seasonings and other foods Onion salt, garlic salt, seasoned salt, table salt, and sea salt. Canned and packaged gravies. Worcestershire sauce. Tartar sauce. Barbecue sauce. Teriyaki sauce. Soy sauce, including reduced-sodium. Steak sauce. Fish sauce. Oyster sauce. Cocktail sauce. Horseradish that you find on the shelf. Regular ketchup and mustard. Meat flavorings and tenderizers. Bouillon cubes. Hot sauce and Tabasco sauce. Premade or packaged marinades. Premade or packaged taco seasonings. Relishes. Regular salad dressings. Salsa. Potato and tortilla chips. Corn chips and puffs. Salted popcorn and pretzels. Canned or dried soups. Pizza. Frozen entrees and pot pies. Summary  Eating less sodium can help lower your blood pressure, reduce swelling, and protect your  heart, liver, and kidneys.  Most people on this plan should limit their sodium intake to 1,500-2,000 mg (milligrams) of sodium each day.  Canned, boxed, and frozen foods are high in sodium. Restaurant foods, fast foods, and pizza are also very high in sodium. You also get sodium by adding salt to food.  Try to cook at home, eat more fresh fruits and vegetables, and eat less fast food, canned, processed, or prepared foods. This information is not intended to replace advice given to you by your health care provider. Make sure you discuss any questions you have with your health care provider. Document Released: 08/17/2001 Document Revised: 02/19/2016 Document Reviewed: 02/19/2016 Elsevier Interactive Patient Education  [employer].   If you need a refill on your cardiac medications before your next appointment, please call your pharmacy.

## 2017-04-21 NOTE — Progress Notes (Signed)
Cardiology Office Note    Date:  04/21/2017   ID:  Yvonne Bailey, DOB 1941-01-29, MRN [id number]  PCP:  Binnie Rail, MD  Cardiologist: Lauree Chandler, MD  Chief Complaint  Patient presents with  . Cerebrovascular Accident    History of Present Illness:  Yvonne Bailey is a 77 y.o. female with history of CAD status post anterior wall MI treated with PCI in 1992, cath 08/2015 with severe three-vessel CAD turned down for CABG and underwent DES to the proximal mid RCA, proximal circumflex, OM 2, diagonal 1.  Echo 01/2016 LVEF 40-45% with moderate TR, trivial MR.  PAD with occlusion of the right SFA and severe stenosis of the left CFA and SFA, PAF on Eliquis, carotid artery disease and HLD.  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  She underwent inpatient rehab stay and has resultant expressive aphasia.  Echo done while in the hospital with bubble study but difficult to see windows and EF not estimated.  Patient comes in today accompanied by her son and granddaughter.  They are concerned because she does not take an interest in taking her medications regularly.  They are making her take her Eliquis but want [place] to stress the importance of this.  Her Lasix and Plavix were stopped in the hospital by neurology.  She has had no problems with heart failure since she has been home.  No angina.    Past Medical History:  Diagnosis Date  . CAD (coronary artery disease)    S/P  anterior  wall myocardial infarction in '92, treated w/percutaneous transluminal coronary angioplasty of the left anterior descending. EF 45%  . Carotid artery disease ([place])   . DVT (deep venous thrombosis) ([place])    X1  . History of nuclear stress test    a. Myoview 6/17: EF 20-25%, mid anteroseptal, apical anterior, apical septal, apical inferior, apical lateral and apical scar, no ischemia, intermediate  risk  . HTN (hypertension)   . Hyperlipidemia   . Hypothyroidism   . Ischemic cardiomyopathy    a. Echo 6/17: EF 20-25%, apex appears akinetic, MAC, moderate MR, moderate LAE, mild RVE, trivial PI, PASP 47 mmHg (needs repeat with Definity contrast) //  b. Limited echo with Definity contrast 7/17: EF 25-30%, moderate to severe LAE  . MI (myocardial infarction) ([place]) 1992   at age 13; TIA treated with coumadin until Plavix initiated in 2006  . PAD (peripheral artery disease) (H[facility])    Right SFA occlusion, severe disease left CFA and SFA    Past Surgical History:  Procedure Laterality Date  . APPENDECTOMY     at hysterectomy and USO for fibroids, Dr. Ysidro Evert  . CARDIAC CATHETERIZATION  1992   Dr Eustace Quail  . CARDIAC CATHETERIZATION N/A 09/06/2015   Procedure: Right/Left Heart Cath and Coronary Angiography;  Surgeon: Burnell Blanks, MD;  Location: [place] [facility];  Service: Cardiovascular;  Laterality: N/A;  . CARDIAC CATHETERIZATION N/A 09/07/2015   Procedure: Coronary Stent Intervention;  Surgeon: Burnell Blanks, MD;  Location: [place] [facility];  Service: Cardiovascular;  Laterality: N/A;  . COLONOSCOPY     negative; 2008, Dr. Delfin Edis  . fracture LLE     '94; pinned  . IR ANGIO INTRA EXTRACRAN SEL COM CAROTID INNOMINATE BILAT MOD SED  03/02/2017  . IR ANGIO VERTEBRAL SEL VERTEBRAL UNI R MOD SED  03/02/2017  . RADIOLOGY  WITH ANESTHESIA N/A 03/02/2017   Procedure: RADIOLOGY WITH ANESTHESIA;  Surgeon: Luanne Bras, MD;  Location: [facility];  Service: Radiology;  Laterality: N/A;  . TONSILLECTOMY    . TOTAL ABDOMINAL HYSTERECTOMY     & BSO for fibroids    Current Medications: Current Meds  Medication Sig  . amLODipine (NORVASC) 2.5 MG tablet Take 1 tablet (2.5 mg total) by mouth daily.  Marland Kitchen atorvastatin (LIPITOR) 80 MG tablet Take 1 tablet (80 mg total) by mouth daily.  . benazepril (LOTENSIN) 20 MG tablet Take 1.5 tablets (30 mg total) by mouth daily.  .  dorzolamide-timolol (COSOPT) 22.3-6.8 MG/ML ophthalmic solution 1 drop 2 (two) times daily.  Marland Kitchen ELIQUIS 5 MG TABS tablet TAKE 1 TABLET TWICE DAILY  . levothyroxine (SYNTHROID, LEVOTHROID) 100 MCG tablet Take 100 mcg by mouth daily before breakfast.  . metoprolol succinate (TOPROL-XL) 50 MG 24 hr tablet Take 1 tablet (50 mg total) by mouth 2 (two) times daily. Take with or immediately following a meal.  . nitroGLYCERIN (NITROSTAT) 0.4 MG SL tablet Place 1 tablet (0.4 mg total) under the tongue every 5 (five) minutes as needed for chest pain.  Marland Kitchen spironolactone (ALDACTONE) 25 MG tablet Take 1 tablet (25 mg total) by mouth daily.  . travoprost, benzalkonium, (TRAVATAN) 0.004 % ophthalmic solution Place 1 drop into both eyes at bedtime.     Allergies:   Definity [perflutren lipid microsphere] and Cilostazol   Social History   Socioeconomic History  . Marital status: Married    Spouse name: [name]  . Number of children: 1  . Years of education: college  . Highest education level: None  Social Needs  . Financial resource strain: None  . Food insecurity - worry: None  . Food insecurity - inability: None  . Transportation needs - medical: None  . Transportation needs - non-medical: None  Occupational History  . Occupation: Pharmacist, hospital    Comment: Retired  Tobacco Use  . Smoking status: Former Smoker    Last attempt to quit: 03/11/1990    Years since quitting: 27.1  . Smokeless tobacco: Never Used  . Tobacco comment: smoked [place], up to < 5 cigarettes  Substance and Sexual Activity  . Alcohol use: No  . Drug use: No  . Sexual activity: None  Other Topics Concern  . 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 08/31/09- designated party form signed appointing, husband Enslie Sahota; ok to leave msg on home phone [phone].      Caffeine Small Amount one cup very rare.   Right handed.   One child- Yvonne Bailey     Family  History:  The patient's family history includes Coronary artery disease in her brother; Diabetes in her mother; Hypertension in her mother; Stroke in her brother.   ROS:   Please see the history of present illness.    Review of Systems  Constitution: Positive for weakness.  HENT: Negative.   Eyes: Negative.   Cardiovascular: Negative.   Respiratory: Negative.   Hematologic/Lymphatic: Negative.   Musculoskeletal: Negative.  Negative for joint pain.  Gastrointestinal: Negative.   Genitourinary: Negative.   Neurological: Positive for difficulty with concentration, disturbances in coordination and loss of balance.   All other systems reviewed and are negative.   PHYSICAL EXAM:   VS:  BP 132/82   Pulse 74   Ht 5' 6"  (1.676 m)   Wt 220 lb 6.4 oz (100 kg)  SpO2 96%   BMI 35.57 kg/m   Physical Exam  GEN: Obese in no acute distress  Neck: no JVD, carotid bruits, or masses Cardiac: Irregular irregular, distant heart sounds Respiratory:  clear to auscultation bilaterally, normal work of breathing GI: soft, nontender, nondistended, + BS Ext: Mild ankle edema bilaterally without cyanosis, clubbing, Good distal pulses bilaterally Neuro:  Alert and Oriented x 3 Psych: euthymic mood, full affect  Wt Readings from Last 3 Encounters:  04/21/17 220 lb 6.4 oz (100 kg)  03/31/17 206 lb (93.4 kg)  03/06/17 224 lb 10.4 oz (101.9 kg)      Studies/Labs Reviewed:   EKG:  EKG is ordered today.  The ekg ordered today demonstrates atrial fibrillation at 73 bpm, nonspecific ST-T wave changes no acute change  Recent Labs: 07/09/2016: Pro B Natriuretic peptide (BNP) 341.0 02/04/2017: TSH 4.34 03/07/2017: ALT 18 03/17/2017: BUN 13; Creatinine, Ser 0.97; Hemoglobin 12.4; Platelets 284; Potassium 3.4; Sodium 140   Lipid Panel    Component Value Date/Time   CHOL 151 03/03/2017 0551   TRIG 62 03/03/2017 0551   HDL 45 03/03/2017 0551   CHOLHDL 3.4 03/03/2017 0551   VLDL 12 03/03/2017 0551    LDLCALC 94 03/03/2017 0551    Additional studies/ records that were reviewed today include:   Echo with bubble study 12/24/18Study Conclusions   - Procedure narrative: Transthoracic echocardiography. Image   quality was suboptimal. The study was technically difficult, as a   result of poor sound wave transmission and restricted patient   mobility. Intravenous contrast (agitated saline) was   administered. - Left ventricle: There is a false tendon in the mid LV of no   clinical significance. LVF appears reduced but poor acoustical   windows prevent accurate assessment of EF or wall motion. Tech   reported that patient has allergy to echo contrast. Bubble study   done but poor quality. Consider Cardiac MRI The cavity size was   normal. There was moderate concentric hypertrophy. Systolic   function was normal. Wall motion was normal; there were no   regional wall motion abnormalities. - Aortic valve: Trileaflet; mildly thickened, mildly calcified   leaflets. - Mitral valve: Valve area by continuity equation (using LVOT   flow): 0.86 cm^2. - Left atrium: The atrium was mildly dilated. - Pulmonary arteries: PA peak pressure: 32 mm Hg (S).   Recommendations:  Consider cardiac MRI to assess EF and wall motion.     Ct Head Code Stroke Wo Contrast 03/02/2017 IMPRESSION:  1. No acute intracranial infarct or other process identified.  2. ASPECTS is 10.  3. Multiple remote lacunar infarcts involving the bilateral basal ganglia, right cerebral hemisphere, and bilateral cerebellar hemispheres as above.  4. Atrophy with chronic small vessel ischemic disease.  5. 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    Transthoracic Echocardiogram Complete Bubble Study - Left ventricle: There is a false tendon in the mid LV of no   clinical significance. LVF appears reduced but poor acoustical   windows prevent accurate assessment of EF or 03/03/2017   S  /P bilatera;l common carotid arteriogram. RT CFA approach. Findings. 1.Slowly recanalizing ant parietal branch of inf division  Of Lt MCA M2/M3 junction. 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 2. Old RIGHT basal ganglia infarct, likely hemorrhagic etiology. 3. Numerous old small and large vessel infarcts  including small bifrontal/MCA territory infarcts   Cerebral Angiogram - Dr Estanislado Pandy SUBJECTIVE:/P bilatera;l common carotid arteriogram RT CFA approach. Findings. 1.Slowly recanalizing ant parietal branch of inf division  Of Lt MCA M2/M3 junction. 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 Loss of flow related enhancement of a proximal left M2 branch, suspicious for occlusion.  2. Otherwise negative CTA for large vessel occlusion.  3. Occluded left vertebral artery. Dominant right vertebral artery patent to the vertebrobasilar junction.  4. Moderate carotid siphon atherosclerosis with moderate multifocal stenoses.  5. Carotid bifurcation atherosclerosis without flow-limiting stenosis.    Ct Cerebral Perfusion W Contrast 03/02/2017 IMPRESSION:  Perfusion mismatch volume of 24 mL, corresponding to CT abnormality of the LEFT MCA branch subserving the LEFT frontal cortex and subcortical white matter. No core infarct volume is established    ASSESSMENT:    1. Cerebrovascular accident (CVA) due to thrombosis of precerebral artery ([place]   2. Coronary artery disease involving native coronary artery of native heart without angina pectoris   3. Persistent atrial fibrillation (HCC)   4. Cardiomyopathy, ischemic   5. Chronic systolic heart failure ([place]   6. Essential hypertension      PLAN:  In order of problems listed above:  Acute  CVA involving the left middle cerebral artery 02/2017 felt secondary to atrial fibrillation and noncompliance with Eliquis.  Recovering after stay at inpatient rehab.  Aphasia has improved.  CAD status post anterior wall MI treated with PCI in 1992, cath 08/2015 with severe three-vessel CAD turned down for CABG and underwent DES to the proximal mid RCA, proximal circumflex, OM 2, diagonal 1.  Plavix stopped in the hospital.  Discussed with Dr. MAngelena Formwho recommends starting aspirin 81 mg once daily.  Persistant atrial fibrillation on Eliquis, rate controlled with Toprol.  Patient's son and granddaughter are concerned because she is not good about taking her medications.  Discussed the importance for stroke prevention.  Ischemic cardiomyopathy ejection fraction 40-45% echo in the hospital difficult to evaluate.  No evidence of heart failure on exam.  Lasix stopped in the hospital and she is having a lot of trouble with controlling her bladder.  They prefer to keep her off this.  Chronic systolic CHF compensated.  Can use Lasix as needed.  Essential hypertension blood pressure stable.   Medication Adjustments/Labs and Tests Ordered: Current medicines are reviewed at length with the patient today.  Concerns regarding medicines are outlined above.  Medication changes, Labs and Tests ordered today are listed in the Patient Instructions below. Patient Instructions  Medication Instructions:  Your physician has recommended you make the following change in your medication:  1.  START Aspirin 81 mg daily  Labwork: None ordered  Testing/Procedures: None ordered  Follow-Up: Your physician recommends that you schedule a follow-up appointment in: [place](YOU CAN PUSH OUT YOUR LAB WORK UNTIL THEN IF YOU WOULD LIKE)   Any Other Special Instructions Will Be Listed Below (If Applicable).   Low-Sodium Eating Plan Sodium, which is an element that makes up salt, helps you maintain a  healthy balance of fluids in your body. Too much sodium can increase your blood pressure and cause fluid and waste to be held in your body. Your health care provider or dietitian may recommend following this plan if you have high blood pressure (hypertension), kidney disease,  liver disease, or heart failure. Eating less sodium can help lower your blood pressure, reduce swelling, and protect your heart, liver, and kidneys. What are tips for following this plan? General guidelines  Most people on this plan should limit their sodium intake to 1,500-2,000 mg (milligrams) of sodium each day. Reading food labels  The Nutrition Facts label lists the amount of sodium in one serving of the food. If you eat more than one serving, you must multiply the listed amount of sodium by the number of servings.  Choose foods with less than 140 mg of sodium per serving.  Avoid foods with 300 mg of sodium or more per serving. Shopping  Look for lower-sodium products, often labeled as "low-sodium" or "no salt added."  Always check the sodium content even if foods are labeled as "unsalted" or "no salt added".  Buy fresh foods. ? Avoid canned foods and premade or frozen meals. ? Avoid canned, cured, or processed meats  Buy breads that have less than 80 mg of sodium per slice. Cooking  Eat more home-cooked food and less restaurant, buffet, and fast food.  Avoid adding salt when cooking. Use salt-free seasonings or herbs instead of table salt or sea salt. Check with your health care provider or pharmacist before using salt substitutes.  Cook with plant-based oils, such as canola, sunflower, or olive oil. Meal planning  When eating at a restaurant, ask that your food be prepared with less salt or no salt, if possible.  Avoid foods that contain MSG (monosodium glutamate). MSG is sometimes added to [place] food, bouillon, and some canned foods. What foods are recommended? The items listed may not be a  complete list. Talk with your dietitian about what dietary choices are best for you. Grains Low-sodium cereals, including oats, puffed wheat and rice, and shredded wheat. Low-sodium crackers. Unsalted rice. Unsalted pasta. Low-sodium bread. Whole-grain breads and whole-grain pasta. Vegetables Fresh or frozen vegetables. "No salt added" canned vegetables. "No salt added" tomato sauce and paste. Low-sodium or reduced-sodium tomato and vegetable juice. Fruits Fresh, frozen, or canned fruit. Fruit juice. Meats and other protein foods Fresh or frozen (no salt added) meat, poultry, seafood, and fish. Low-sodium canned tuna and salmon. Unsalted nuts. Dried peas, beans, and lentils without added salt. Unsalted canned beans. Eggs. Unsalted nut butters. Dairy Milk. Soy milk. Cheese that is naturally low in sodium, such as ricotta cheese, fresh mozzarella, or Swiss cheese Low-sodium or reduced-sodium cheese. Cream cheese. Yogurt. Fats and oils Unsalted butter. Unsalted margarine with no trans fat. Vegetable oils such as canola or olive oils. Seasonings and other foods Fresh and dried herbs and spices. Salt-free seasonings. Low-sodium mustard and ketchup. Sodium-free salad dressing. Sodium-free light mayonnaise. Fresh or refrigerated horseradish. Lemon juice. Vinegar. Homemade, reduced-sodium, or low-sodium soups. Unsalted popcorn and pretzels. Low-salt or salt-free chips. What foods are not recommended? The items listed may not be a complete list. Talk with your dietitian about what dietary choices are best for you. Grains Instant hot cereals. Bread stuffing, pancake, and biscuit mixes. Croutons. Seasoned rice or pasta mixes. Noodle soup cups. Boxed or frozen macaroni and cheese. Regular salted crackers. Self-rising flour. Vegetables Sauerkraut, pickled vegetables, and relishes. Olives. [place] fries. Onion rings. Regular canned vegetables (not low-sodium or reduced-sodium). Regular canned tomato sauce and  paste (not low-sodium or reduced-sodium). Regular tomato and vegetable juice (not low-sodium or reduced-sodium). Frozen vegetables in sauces. Meats and other protein foods Meat or fish that is salted, canned, smoked, spiced, or pickled. Berniece Salines, ham,  sausage, hotdogs, corned beef, chipped beef, packaged lunch meats, salt pork, jerky, pickled herring, anchovies, regular canned tuna, sardines, salted nuts. Dairy Processed cheese and cheese spreads. Cheese curds. Blue cheese. Feta cheese. String cheese. Regular cottage cheese. Buttermilk. Canned milk. Fats and oils Salted butter. Regular margarine. Ghee. Bacon fat. Seasonings and other foods Onion salt, garlic salt, seasoned salt, table salt, and sea salt. Canned and packaged gravies. Worcestershire sauce. Tartar sauce. Barbecue sauce. Teriyaki sauce. Soy sauce, including reduced-sodium. Steak sauce. Fish sauce. Oyster sauce. Cocktail sauce. Horseradish that you find on the shelf. Regular ketchup and mustard. Meat flavorings and tenderizers. Bouillon cubes. Hot sauce and Tabasco sauce. Premade or packaged marinades. Premade or packaged taco seasonings. Relishes. Regular salad dressings. Salsa. Potato and tortilla chips. Corn chips and puffs. Salted popcorn and pretzels. Canned or dried soups. Pizza. Frozen entrees and pot pies. Summary  Eating less sodium can help lower your blood pressure, reduce swelling, and protect your heart, liver, and kidneys.  Most people on this plan should limit their sodium intake to 1,500-2,000 mg (milligrams) of sodium each day.  Canned, boxed, and frozen foods are high in sodium. Restaurant foods, fast foods, and pizza are also very high in sodium. You also get sodium by adding salt to food.  Try to cook at home, eat more fresh fruits and vegetables, and eat less fast food, canned, processed, or prepared foods. This information is not intended to replace advice given to you by your health care provider. Make sure you  discuss any questions you have with your health care provider. Document Released: 08/17/2001 Document Revised: 02/19/2016 Document Reviewed: 02/19/2016 Elsevier Interactive Patient Education  [employer].   If you need a refill on your cardiac medications before your next appointment, please call your pharmacy.      Sumner Boast, PA-C  04/21/2017 3:18 PM    [facility] [place], [place], [place]  [address] Phone: [phone]; Fax: [phone]

## 2017-04-21 NOTE — Patient Outreach (Signed)
[facility]) Care Management  04/21/2017  Yvonne Bailey Jan 14, 1941 [id number]    Transition of care  RN attempted another outreach call that was unsuccessful. Unable to leave a voice message. Will send outreach letter and follow up with another call prior to closure of this case.  Elliot Cousin, RN Care Management Coordinator [facility] Main Office [phone]

## 2017-04-21 NOTE — Telephone Encounter (Signed)
Received and faxed to [facility].

## 2017-04-22 ENCOUNTER — Other Ambulatory Visit: Payer: Self-pay | Admitting: *Deleted

## 2017-04-22 ENCOUNTER — Ambulatory Visit (HOSPITAL_COMMUNITY): Admission: RE | Admit: 2017-04-22 | Payer: Medicare HMO | Source: Ambulatory Visit

## 2017-04-22 NOTE — Patient Outreach (Signed)
[facility]) Care Management  04/22/2017  EURETTA HOECKER 02/11/1941 [id number]   Phone call to [facility] home [facility], spoke with Sharyl Nimrod Clinical supervisor to request PT and OT  notes that would support patient's admission to a SNF. Per Meredith's patient's last PT notes read that patient was progressing well (no falls, increasing in strength, demonstrating steady gait)  and the plan was to end services next week.. This Child psychotherapist spoke with PT Almira from [facility] who confirms patient's progress and states that the family's main concern is toileting and hygiene.  Plan: This social worker will contact patient's son to review community resources for in home aids and day programs.   Adriana Reams [facility] Care Management [phone]

## 2017-04-22 NOTE — Patient Outreach (Signed)
[facility]) Care Management  04/22/2017  CUBA HAGGLUND 26-Nov-1940 [id number]   Follow up phone call to patient's son regarding request to return to SNF.  This Child psychotherapist discussed having little documentation to justify patient's return to a SNF.  [facility] call regarding patient's  Progress discussed with patient's son stating that patient is progressing well iand that there is no recommendation for patient return to rehab at this time. Community resources reviewed such as Civil engineer, contracting and the possibility of hiring an in home aid to assist patient with her ADL's.  Patient's son admits to seeing progress  however would still like patient return to rehab due to toileting and hygiene concerns, tiredness after rehab sessions and stiffness. This social worker encouraged patient's son to re-consider a private duty aid to assist with patient when his daughter is not available. It was also recommended that he discuss his concerns or questions regarding patient's care with the [facility] PT and/or OT. Patient verbalized having no additional community resource needs. Patient to be closed to social work at this time.   Adriana Reams [facility] Care Management [phone]

## 2017-04-23 DIAGNOSIS — Z7901 Long term (current) use of anticoagulants: Secondary | ICD-10-CM | POA: Diagnosis not present

## 2017-04-23 DIAGNOSIS — N390 Urinary tract infection, site not specified: Secondary | ICD-10-CM | POA: Diagnosis not present

## 2017-04-23 DIAGNOSIS — I10 Essential (primary) hypertension: Secondary | ICD-10-CM | POA: Diagnosis not present

## 2017-04-23 DIAGNOSIS — I69351 Hemiplegia and hemiparesis following cerebral infarction affecting right dominant side: Secondary | ICD-10-CM | POA: Diagnosis not present

## 2017-04-23 DIAGNOSIS — Z9181 History of falling: Secondary | ICD-10-CM | POA: Diagnosis not present

## 2017-04-24 ENCOUNTER — Ambulatory Visit: Payer: Medicare HMO | Admitting: Physical Medicine & Rehabilitation

## 2017-04-24 DIAGNOSIS — Z7901 Long term (current) use of anticoagulants: Secondary | ICD-10-CM | POA: Diagnosis not present

## 2017-04-24 DIAGNOSIS — Z9181 History of falling: Secondary | ICD-10-CM | POA: Diagnosis not present

## 2017-04-24 DIAGNOSIS — N390 Urinary tract infection, site not specified: Secondary | ICD-10-CM | POA: Diagnosis not present

## 2017-04-24 DIAGNOSIS — I10 Essential (primary) hypertension: Secondary | ICD-10-CM | POA: Diagnosis not present

## 2017-04-24 DIAGNOSIS — I69351 Hemiplegia and hemiparesis following cerebral infarction affecting right dominant side: Secondary | ICD-10-CM | POA: Diagnosis not present

## 2017-04-25 ENCOUNTER — Ambulatory Visit (HOSPITAL_COMMUNITY): Admission: RE | Admit: 2017-04-25 | Payer: Medicare HMO | Source: Ambulatory Visit

## 2017-04-28 ENCOUNTER — Ambulatory Visit (HOSPITAL_COMMUNITY)
Admission: RE | Admit: 2017-04-28 | Discharge: 2017-04-28 | Disposition: A | Discharge status: Home / Self Care | Payer: Medicare HMO | Source: Ambulatory Visit | Attending: Interventional Radiology | Admitting: Interventional Radiology

## 2017-04-28 DIAGNOSIS — Z7901 Long term (current) use of anticoagulants: Secondary | ICD-10-CM | POA: Diagnosis not present

## 2017-04-28 DIAGNOSIS — I729 Aneurysm of unspecified site: Secondary | ICD-10-CM

## 2017-04-28 DIAGNOSIS — I639 Cerebral infarction, unspecified: Secondary | ICD-10-CM | POA: Diagnosis not present

## 2017-04-28 DIAGNOSIS — I69351 Hemiplegia and hemiparesis following cerebral infarction affecting right dominant side: Secondary | ICD-10-CM | POA: Diagnosis not present

## 2017-04-28 DIAGNOSIS — Z9181 History of falling: Secondary | ICD-10-CM | POA: Diagnosis not present

## 2017-04-28 DIAGNOSIS — I10 Essential (primary) hypertension: Secondary | ICD-10-CM | POA: Diagnosis not present

## 2017-04-28 DIAGNOSIS — N390 Urinary tract infection, site not specified: Secondary | ICD-10-CM | POA: Diagnosis not present

## 2017-04-28 HISTORY — PX: IR RADIOLOGIST EVAL & MGMT: IMG5224

## 2017-04-29 DIAGNOSIS — N390 Urinary tract infection, site not specified: Secondary | ICD-10-CM | POA: Diagnosis not present

## 2017-04-29 DIAGNOSIS — I10 Essential (primary) hypertension: Secondary | ICD-10-CM | POA: Diagnosis not present

## 2017-04-29 DIAGNOSIS — Z9181 History of falling: Secondary | ICD-10-CM | POA: Diagnosis not present

## 2017-04-29 DIAGNOSIS — Z7901 Long term (current) use of anticoagulants: Secondary | ICD-10-CM | POA: Diagnosis not present

## 2017-04-29 DIAGNOSIS — I69351 Hemiplegia and hemiparesis following cerebral infarction affecting right dominant side: Secondary | ICD-10-CM | POA: Diagnosis not present

## 2017-04-30 ENCOUNTER — Encounter (HOSPITAL_COMMUNITY): Payer: Self-pay | Admitting: Interventional Radiology

## 2017-04-30 DIAGNOSIS — Z9181 History of falling: Secondary | ICD-10-CM | POA: Diagnosis not present

## 2017-04-30 DIAGNOSIS — Z7901 Long term (current) use of anticoagulants: Secondary | ICD-10-CM | POA: Diagnosis not present

## 2017-04-30 DIAGNOSIS — I69351 Hemiplegia and hemiparesis following cerebral infarction affecting right dominant side: Secondary | ICD-10-CM | POA: Diagnosis not present

## 2017-04-30 DIAGNOSIS — I10 Essential (primary) hypertension: Secondary | ICD-10-CM | POA: Diagnosis not present

## 2017-04-30 DIAGNOSIS — N390 Urinary tract infection, site not specified: Secondary | ICD-10-CM | POA: Diagnosis not present

## 2017-05-01 ENCOUNTER — Other Ambulatory Visit: Payer: Self-pay | Admitting: Cardiovascular Disease

## 2017-05-01 DIAGNOSIS — I482 Chronic atrial fibrillation, unspecified: Secondary | ICD-10-CM

## 2017-05-01 DIAGNOSIS — I10 Essential (primary) hypertension: Secondary | ICD-10-CM

## 2017-05-02 ENCOUNTER — Other Ambulatory Visit: Payer: Self-pay | Admitting: *Deleted

## 2017-05-02 ENCOUNTER — Telehealth: Payer: Self-pay | Admitting: Internal Medicine

## 2017-05-02 ENCOUNTER — Encounter: Payer: Self-pay | Admitting: *Deleted

## 2017-05-02 DIAGNOSIS — N390 Urinary tract infection, site not specified: Secondary | ICD-10-CM | POA: Diagnosis not present

## 2017-05-02 DIAGNOSIS — Z7901 Long term (current) use of anticoagulants: Secondary | ICD-10-CM | POA: Diagnosis not present

## 2017-05-02 DIAGNOSIS — Z9181 History of falling: Secondary | ICD-10-CM | POA: Diagnosis not present

## 2017-05-02 DIAGNOSIS — I69351 Hemiplegia and hemiparesis following cerebral infarction affecting right dominant side: Secondary | ICD-10-CM | POA: Diagnosis not present

## 2017-05-02 DIAGNOSIS — I10 Essential (primary) hypertension: Secondary | ICD-10-CM | POA: Diagnosis not present

## 2017-05-02 NOTE — Telephone Encounter (Signed)
Spoke with Tammy to give verbal orders per MD

## 2017-05-02 NOTE — Telephone Encounter (Signed)
Copied from CRM [id number]. Topic: Quick Communication - See Telephone Encounter >> May 02, 2017  1:52 PM Louie Bun, Rosey Bath D wrote: CRM for notification. See Telephone encounter for: 05/02/17. Tammy from [facility] called requesting verbal order for speech therapy for patient as follow: 2X for 2 weeks. Her number is [phone].

## 2017-05-02 NOTE — Patient Outreach (Signed)
[facility]) Care Management  05/02/2017  CLEA CAINE [id number] [id number]    RN spoke with son today [name] concerning ongoing [facility] services after outreach letter sent. Son indicated [facility] was involved for PT/OT/speech along with a nurse and currently he all the help he needs at this time other then medication possible resources available to assist with daily medication to the pt. Caregiver was specifically seeking assistance for placement however pt does not qualify after involvement with [facility] social worker assessment. RN has informed caregiver that there is no resources available at this time that delivers such services at no charge. Strongly encouraged caregiver to seek family or friends that can assist with this need. Reminded caregiver that [facility] is involved so if needs are presented he can seek assistance through the agency or contact [facility] for further assistance. Verified caregiver has contact for [facility] and to contact [facility] directly with his inquiries or any other resources he may need. Case will be closed and primary provider will be notified pt has no needs to address [facility] services at this time and opt to decline ongoing transition of care contacts.  Elliot Cousin, RN Care Management Coordinator [facility] Main Office [phone]

## 2017-05-06 ENCOUNTER — Other Ambulatory Visit: Payer: Self-pay | Admitting: Cardiovascular Disease

## 2017-05-06 ENCOUNTER — Telehealth: Payer: Self-pay | Admitting: Internal Medicine

## 2017-05-06 DIAGNOSIS — N390 Urinary tract infection, site not specified: Secondary | ICD-10-CM | POA: Diagnosis not present

## 2017-05-06 DIAGNOSIS — I10 Essential (primary) hypertension: Secondary | ICD-10-CM | POA: Diagnosis not present

## 2017-05-06 DIAGNOSIS — Z7901 Long term (current) use of anticoagulants: Secondary | ICD-10-CM | POA: Diagnosis not present

## 2017-05-06 DIAGNOSIS — I69351 Hemiplegia and hemiparesis following cerebral infarction affecting right dominant side: Secondary | ICD-10-CM | POA: Diagnosis not present

## 2017-05-06 DIAGNOSIS — Z9181 History of falling: Secondary | ICD-10-CM | POA: Diagnosis not present

## 2017-05-06 NOTE — Telephone Encounter (Signed)
Patient was last seen 03/31/17. Please advise. Thank you.

## 2017-05-06 NOTE — Telephone Encounter (Signed)
Pt's pharmacy is requesting a refill on furosemide. This medication was D/C while pt was in the hospital. LOV on 2/11/9 with Herma Carson, PA, stated that pt could take furosemide as needed, but it was not put back on pt's medication list. Please advise if Dr. Clifton James would like for pt to continue taking furosemide and what strength. Thanks

## 2017-05-06 NOTE — Telephone Encounter (Signed)
I think all that would need to be ordered would be nursing. But pt is currently using [facility] I think.

## 2017-05-06 NOTE — Telephone Encounter (Signed)
Copied from CRM [id number]. Topic: Quick Communication - See Telephone Encounter >> May 06, 2017 10:17 AM Rudi Coco, NT wrote: CRM for notification. See Telephone encounter for:   05/06/17. Pt. Son [name] calling to request home health aide to help pt. With bath and daily adls. MR. Aundria Rud can be reached at [phone]

## 2017-05-07 NOTE — Telephone Encounter (Signed)
Health aide will not be covered by insurance - may need to call [facility]

## 2017-05-08 DIAGNOSIS — I69351 Hemiplegia and hemiparesis following cerebral infarction affecting right dominant side: Secondary | ICD-10-CM | POA: Diagnosis not present

## 2017-05-08 DIAGNOSIS — Z7901 Long term (current) use of anticoagulants: Secondary | ICD-10-CM | POA: Diagnosis not present

## 2017-05-08 DIAGNOSIS — N390 Urinary tract infection, site not specified: Secondary | ICD-10-CM | POA: Diagnosis not present

## 2017-05-08 DIAGNOSIS — I10 Essential (primary) hypertension: Secondary | ICD-10-CM | POA: Diagnosis not present

## 2017-05-08 DIAGNOSIS — Z9181 History of falling: Secondary | ICD-10-CM | POA: Diagnosis not present

## 2017-05-08 NOTE — Telephone Encounter (Signed)
Spoke with pts son. Pt is not using [facility], pt is still with [facility]. LVM with [facility] to follow-up, Verbal orders were given for [facility] aide to assist with bathing on 04/04/17

## 2017-05-08 NOTE — Telephone Encounter (Signed)
Pt is up for discharge next week.    [phone]

## 2017-05-08 NOTE — Telephone Encounter (Signed)
Per last office note can take as needed. Previous dose was 40 mg.  Will send to pharmacy with new directions to take one tablet by mouth daily as needed.

## 2017-05-09 NOTE — Telephone Encounter (Signed)
LVM again with [facility] to find out the agencies that could health with a nurse aid for bathing.

## 2017-05-13 DIAGNOSIS — N390 Urinary tract infection, site not specified: Secondary | ICD-10-CM | POA: Diagnosis not present

## 2017-05-13 DIAGNOSIS — Z7901 Long term (current) use of anticoagulants: Secondary | ICD-10-CM | POA: Diagnosis not present

## 2017-05-13 DIAGNOSIS — I10 Essential (primary) hypertension: Secondary | ICD-10-CM | POA: Diagnosis not present

## 2017-05-13 DIAGNOSIS — Z9181 History of falling: Secondary | ICD-10-CM | POA: Diagnosis not present

## 2017-05-13 DIAGNOSIS — I69351 Hemiplegia and hemiparesis following cerebral infarction affecting right dominant side: Secondary | ICD-10-CM | POA: Diagnosis not present

## 2017-05-13 NOTE — Telephone Encounter (Signed)
Received call back from [facility] and stated that they have already spoke with son to give agencies that could help pt with ADLs.

## 2017-05-15 ENCOUNTER — Other Ambulatory Visit: Payer: Medicare HMO

## 2017-05-15 DIAGNOSIS — N390 Urinary tract infection, site not specified: Secondary | ICD-10-CM | POA: Diagnosis not present

## 2017-05-15 DIAGNOSIS — Z7901 Long term (current) use of anticoagulants: Secondary | ICD-10-CM | POA: Diagnosis not present

## 2017-05-15 DIAGNOSIS — Z9181 History of falling: Secondary | ICD-10-CM | POA: Diagnosis not present

## 2017-05-15 DIAGNOSIS — I10 Essential (primary) hypertension: Secondary | ICD-10-CM | POA: Diagnosis not present

## 2017-05-15 DIAGNOSIS — I69351 Hemiplegia and hemiparesis following cerebral infarction affecting right dominant side: Secondary | ICD-10-CM | POA: Diagnosis not present

## 2017-06-03 ENCOUNTER — Ambulatory Visit: Payer: BC Managed Care – PPO | Admitting: Neurology

## 2017-06-03 ENCOUNTER — Encounter: Payer: Self-pay | Admitting: Neurology

## 2017-06-03 ENCOUNTER — Other Ambulatory Visit: Payer: Commercial Managed Care - HMO | Admitting: *Deleted

## 2017-06-03 DIAGNOSIS — E785 Hyperlipidemia, unspecified: Secondary | ICD-10-CM | POA: Diagnosis not present

## 2017-06-03 LAB — LIPID PANEL
Chol/HDL Ratio: 3.3 ratio (ref 0.0–4.4)
Cholesterol, Total: 131 mg/dL (ref 100–199)
HDL: 40 mg/dL (ref >39)
LDL Calculated: 72 mg/dL (ref 0–99)
Triglycerides: 94 mg/dL (ref 0–149)
VLDL Cholesterol Cal: 19 mg/dL (ref 5–40)

## 2017-06-03 LAB — HEPATIC FUNCTION PANEL
ALT: 18 IU/L (ref 0–32)
AST: 20 IU/L (ref 0–40)
Albumin: 4 g/dL (ref 3.5–4.8)
Alkaline Phosphatase: 130 IU/L — ABNORMAL HIGH (ref 39–117)
Bilirubin Total: 0.7 mg/dL (ref 0.0–1.2)
Bilirubin, Direct: 0.2 mg/dL (ref 0.00–0.40)
Total Protein: 6.8 g/dL (ref 6.0–8.5)

## 2017-06-03 LAB — DRAW FEE (FINGERSTICK)

## 2017-06-04 ENCOUNTER — Ambulatory Visit: Payer: Medicare HMO | Admitting: Neurology

## 2017-06-04 ENCOUNTER — Encounter: Payer: Self-pay | Admitting: Neurology

## 2017-06-04 VITALS — BP 144/72 | HR 62 | Wt 221.4 lb

## 2017-06-04 DIAGNOSIS — I63412 Cerebral infarction due to embolism of left middle cerebral artery: Secondary | ICD-10-CM

## 2017-06-04 DIAGNOSIS — I480 Paroxysmal atrial fibrillation: Secondary | ICD-10-CM

## 2017-06-04 NOTE — Patient Instructions (Signed)
I had a long d/w patient and her son about her recent stroke,atrial fibrillation, cerebral aneurysm, risk for recurrent stroke/TIAs, personally independently reviewed imaging studies and stroke evaluation results and answered questions.Continue Eliquis (apixaban) daily  for secondary stroke prevention and maintain strict control of hypertension with blood pressure goal below 130/90, diabetes with hemoglobin A1c goal below 6.5% and lipids with LDL cholesterol goal below 70 mg/dL. 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 Recommend follow-up CT angiogram of the aneurysm at next visit in 3 months with my nurse practitioner  Shanda Bumps.    Stroke Prevention Some medical conditions and behaviors are associated with a higher chance of having a stroke. You can help prevent a stroke by making nutrition, lifestyle, and other changes, including managing any medical conditions you may have. What nutrition changes can be made?  Eat healthy foods. You can do this by: ? Choosing foods high in fiber, such as fresh fruits and vegetables and whole grains. ? Eating at least 5 or more servings of fruits and vegetables a day. Try to fill half of your plate at each meal with fruits and vegetables. ? Choosing lean protein foods, such as lean cuts of meat, poultry without skin, fish, tofu, beans, and nuts. ? Eating low-fat dairy products. ? Avoiding foods that are high in salt (sodium). This can help lower blood pressure. ? Avoiding foods that have saturated fat, trans fat, and cholesterol. This can help prevent high cholesterol. ? Avoiding processed and premade foods.  Follow your health care provider's specific guidelines for losing weight, controlling high blood pressure  (hypertension), lowering high cholesterol, and managing diabetes. These may include: ? Reducing your daily calorie intake. ? Limiting your daily sodium intake to 1,500 milligrams (mg). ? Using only healthy fats for cooking, such as olive oil, canola oil, or sunflower oil. ? Counting your daily carbohydrate intake. What lifestyle changes can be made?  Maintain a healthy weight. Talk to your health care provider about your ideal weight.  Get at least 30 minutes of moderate physical activity at least 5 days a week. Moderate activity includes brisk walking, biking, and swimming.  Do not use any products that contain nicotine or tobacco, such as cigarettes and e-cigarettes. If you need help quitting, ask your health care provider. It may also be helpful to avoid exposure to secondhand smoke.  Limit alcohol intake to no more than 1 drink a day for nonpregnant women and 2 drinks a day for men. One drink equals 12 oz of beer, 5 oz of wine, or 1 oz of hard liquor.  Stop any illegal drug use.  Avoid taking birth control pills. Talk to your health care provider about the risks of taking birth control pills if: ? You are over 81 years old. ? You smoke. ? You get migraines. ? You have ever had a blood clot. What other changes can be made?  Manage your cholesterol levels. ? Eating a healthy diet is important for preventing high cholesterol. If cholesterol cannot be managed through diet alone, you may also need to take medicines. ? Take any prescribed medicines to control your cholesterol as told by your health care provider.  Manage your diabetes. ? Eating a healthy diet and exercising regularly are important parts of managing your blood sugar.  If your blood sugar cannot be managed through diet and exercise, you may need to take medicines. ? Take any prescribed medicines to control your diabetes as told by your health care provider.  Control your hypertension. ? To reduce your risk of stroke, try  to keep your blood pressure below 130/80. ? Eating a healthy diet and exercising regularly are an important part of controlling your blood pressure. If your blood pressure cannot be managed through diet and exercise, you may need to take medicines. ? Take any prescribed medicines to control hypertension as told by your health care provider. ? Ask your health care provider if you should monitor your blood pressure at home. ? Have your blood pressure checked every year, even if your blood pressure is normal. Blood pressure increases with age and some medical conditions.  Get evaluated for sleep disorders (sleep apnea). Talk to your health care provider about getting a sleep evaluation if you snore a lot or have excessive sleepiness.  Take over-the-counter and prescription medicines only as told by your health care provider. Aspirin or blood thinners (antiplatelets or anticoagulants) may be recommended to reduce your risk of forming blood clots that can lead to stroke.  Make sure that any other medical conditions you have, such as atrial fibrillation or atherosclerosis, are managed. What are the warning signs of a stroke? The warning signs of a stroke can be easily remembered as BEFAST.  B is for balance. Signs include: ? Dizziness. ? Loss of balance or coordination. ? Sudden trouble walking.  E is for eyes. Signs include: ? A sudden change in vision. ? Trouble seeing.  F is for face. Signs include: ? Sudden weakness or numbness of the face. ? The face or eyelid drooping to one side.  A is for arms. Signs include: ? Sudden weakness or numbness of the arm, usually on one side of the body.  S is for speech. Signs include: ? Trouble speaking (aphasia). ? Trouble understanding.  T is for time. ? These symptoms may represent a serious problem that is an emergency. Do not wait to see if the symptoms will go away. Get medical help right away. Call your local emergency services (911 in the  [place] Do not drive yourself to the hospital.  Other signs of stroke may include: ? A sudden, severe headache with no known cause. ? Nausea or vomiting. ? Seizure.  Where to find more information: For more information, visit:  American Stroke Association: www.strokeassociation.org  National Stroke Association: www.stroke.org  Summary  You can prevent a stroke by eating healthy, exercising, not smoking, limiting alcohol intake, and managing any medical conditions you may have.  Do not use any products that contain nicotine or tobacco, such as cigarettes and e-cigarettes. If you need help quitting, ask your health care provider. It may also be helpful to avoid exposure to secondhand smoke.  Remember BEFAST for warning signs of stroke. Get help right away if you or a loved one has any of these signs. This information is not intended to replace advice given to you by your health care provider. Make sure you discuss any questions you have with your health care provider. Document Released: 04/04/2004 Document Revised: 04/02/2016 Document Reviewed: 04/02/2016 Elsevier Interactive Patient Education  2018 [employer].   Cerebral Aneurysm An aneurysm is the bulging or ballooning out of part of the weakened wall of a vein or artery. An aneurysm in the vein or artery of the brain is called a brain aneurysm,  or cerebral aneurysm. Aneurysms are a risk to your health because they may leak or rupture. Once the aneurysm leaks or ruptures, bleeding occurs. If the bleeding occurs within the brain tissue, the condition is called an intracerebral hemorrhage. An intracerebral hemorrhage can result in a hemorrhagic stroke. If the bleeding occurs in the area between the brain and the thin tissues that cover the brain, the condition is called a subarachnoid hemorrhage. This increases the pressure on the brain and causes some areas of the brain to not get the necessary blood flow. The blood from the ruptured  aneurysm collects and presses on the surrounding brain tissue. A subarachnoid hemorrhage can cause a stroke. A ruptured cerebral aneurysm is a medical emergency. This can cause permanent damage and loss of brain function. What are the causes? A cerebral aneurysm is caused when a weakened part of the blood vessel expands. The blood vessel expands due to the constant pressure from the flow of blood through the weakened blood vessel. Usually the aneurysm expands slowly. As the weakened aneurysm expands, the walls of the aneurysm become weaker. Aneurysms may be associated with diseases that weaken and damage the walls of your blood vessels or blood vessels that develop abnormally. Some known causes for cerebral aneurysms are:  Head trauma.  Infection.  Use of "recreational drugs" such as cocaine or amphetamines.  What increases the risk? People at risk for a cerebral aneurysm or hemorrhagic stroke usually have one or more risk factors, which include:  Having high blood pressure (hypertension).  Abusing alcohol.  Having abnormal blood vessels present since birth.  Having certain bleeding disorders, such as hemophilia, sickle cell disease, or liver disease.  Taking blood thinners (anticoagulants).  Smoking.  Having a family history of aneurysm.  What are the signs or symptoms? The signs and symptoms of an unruptured cerebral aneurysm will partly depend on its size and rate of growth. A small, unchanging aneurysm generally does not produce symptoms. A larger aneurysm that is steadily growing can increase pressure on the brain or nerves. That increased pressure from the unruptured cerebral aneurysm can cause:  A headache.  Problems with your vision.  Numbness or weakness in an arm or leg.  Problems with memory.  Problems speaking.  Seizures.  If an aneurysm leaks or bursts, it can cause a stroke and be life-threatening. Symptoms may include:  A sudden, severe headache with no  known cause. The headache is often described as the worst headache ever experienced.  Nausea or vomiting, especially when combined with other symptoms such as a headache.  Sudden weakness or numbness of the face, arm, or leg, especially on one side of the body.  Sudden trouble walking or difficulty moving arms or legs.  Sudden confusion.  Sudden personality changes.  Trouble speaking (aphasia) or understanding.  Difficulty swallowing.  Sudden trouble seeing in one or both eyes.  Double vision.  Dizziness.  Loss of balance or coordination.  Intolerance to light.  Stiff neck.  How is this diagnosed? Your health care provider may use one of the following tests to diagnose your aneurysm:  Computed tomographic angiography (CTA). This test uses dye and a scanner to produce images of your blood vessels.  Magnetic resonance angiography (MRA). This test uses an MRI machine to produce images of your blood vessels.  Digital subtraction angiography (DSA). This test uses dye and X-rays to take images of your blood vessels. Your health care provider may use this test to help determine the best course  of treatment.  How is this treated? Unruptured Aneurysms Treatment is complex when an aneurysm is found and it is not causing problems. Treatment is very individualized, as each case is different. Many things must be considered, such as the size and exact location of your aneurysm, your age, your overall health, and your feelings and preferences. Small aneurysms in certain locations of the brain have a very low chance of bleeding or rupturing. These small aneurysms may not be treated. However, depending on the size and location of the aneurysm, treatments may be recommended and include:  Coiling. During this procedure, a catheter is inserted and advanced through a blood vessel. Once the catheter reaches the aneurysm, tiny coils are used to block blood flow into the aneurysm.  Surgical  clipping. During surgery, a clip is placed at the base of the aneurysm. The clip prevents blood from continuing to enter the aneurysm.  Flow diversion. This procedure is used to divert blood flow around the aneurysm.  Ruptured Aneurysms Immediate emergency surgery may be needed to help prevent damage to the brain and to reduce the risk of rebleeding. Timing of treatment is an important factor in the prevention of complications. Successful early treatment of a ruptured aneurysm (within the first 3 days of a bleed) helps to prevent rebleeding and blood vessel spasm. In some cases, there may be a reason to treat later (10-14 days after a rupture). Many things are considered when making this decision, and each case is handled individually. Follow these instructions at home:  Take medicines only as instructed by your health care provider.  Eat healthy foods. It is recommended that you eat 5 or more servings of fruits and vegetables each day. Foods may need to be a special consistency (soft or pureed), or small bites may need to be taken if you have had a ruptured aneurysm or stroke. Certain dietary changes may be advised to address high blood pressure, high cholesterol, diabetes, or obesity. ? Food choices that are low in salt (sodium), saturated fat, trans fat, and cholesterol are recommended to manage high blood pressure. ? Food choices that are high in fiber and low in saturated fat, trans fat, and cholesterol are recommended to control cholesterol levels. ? Controlling carbohydrate and sugar intake is recommended to manage diabetes. ? Reducing calorie intake and making food choices that are low in sodium, saturated fat, trans fat, and cholesterol are recommended to manage obesity.  Maintain a healthy weight.  Stay physically active. It is recommended that you get at least 30 minutes of activity on most or all days.  Do not smoke.  Limit alcohol use. Moderate alcohol use is considered to  be: ? No more than 2 drinks each day for men. ? No more than 1 drink each day for nonpregnant women.  Stop drug abuse.  A safe home environment is important to reduce the risk of falls. Your health care provider may arrange for specialists to evaluate your home. Having grab bars in the bedroom and bathroom is often important. Your health care provider may arrange for special equipment to be used at home, such as raised toilets and a seat for the shower.  Physical, occupational, and speech therapy. Ongoing therapy may be needed to maximize your recovery after a ruptured aneurysm or stroke. If you have been advised to use a walker or a cane, use it at all times. Be sure to keep your therapy appointments.  Follow all instructions for follow-up with your health care provider.  This is very important. This includes any referrals, physical therapy, rehabilitation, and laboratory tests. Proper follow-up may prevent an aneurysm rupture or a stroke. Get help right away if:  You have a sudden, severe headache with no known cause.  You have sudden nausea or vomiting with a severe headache.  You have sudden weakness or numbness of the face, arm, or leg, especially on one side of the body.  You have sudden trouble walking or difficulty moving arms or legs.  You have sudden confusion.  You have trouble speaking or understanding.  You have sudden trouble seeing in one or both eyes.  You have a sudden loss of balance or coordination.  You have a stiff neck.  You have difficulty breathing.  You have a partial or total loss of consciousness. Any of these symptoms may represent a serious problem that is an emergency. Do not wait to see if the symptoms will go away. Get medical help at once. Call your local emergency services (911 in [place] Do not drive yourself to the hospital. This information is not intended to replace advice given to you by your health care provider. Make sure you discuss any  questions you have with your health care provider. Document Released: 11/17/2001 Document Revised: 08/03/2015 Document Reviewed: 08/13/2012 Elsevier Interactive Patient Education  2017 [employer].

## 2017-06-04 NOTE — Progress Notes (Signed)
[facility] [address] [place]. [place] [address] [phone]       OFFICE FOLLOW-UP NOTE  Yvonne. Yvonne Bailey Date of Birth:  1940-05-15 Medical Record Number:  [id number]   HPI: Yvonne Bailey is a 77 year pleasant African-American lady seen today for the first office follow-up visit following hospital admission for stroke in December 2018. She is accompanied by her son today. History is obtained from them and review of electronic medical records. I have personally reviewed imaging films.Yvonne R Lindseyis a 77 y.o.femalewithwith history of DVT on Eliquis, coronary artery disease with previous MI, carotid artery disease, hypertension, lipidemia, ischemic cardiomyopathy and peripheral artery diseasepresenting with aphasia and possible confusion.Shedidnot receive IV t-PA due being to anticoagulation with eliquis but it was unclear whether she was taking it correctly. She was probably taking it only once a day and not twice a day as scheduled. CT scan of the head on admission showed no acute abnormality but showed small remote age infarcts. MRI scan of the brain showed acute nonhemorrhagic left parietal MCA branch infarct and old right basal ganglia as well as small remote age bifrontal infarcts. MRA of the brain was not done. Cerebral catheter angiogram was performed which showed slow recanalizing anterior parietal branch of the inferior division of the left MCA M2/M3 junction which was not felt to be amenable for endovascular treatment. 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 LDL cholesterol was 94 mg percent. Hemoglobin A1c was 5.4. Patient had mild expressive aphasia and eliquis was resumed and patient counseled to take the correct dose. Patient was felt to be a candidate for inpatient rehabilitation and was transferred there when bed was available. Patient states she's done  well since discharge. She is finished outpatient therapies. She still gets occasionally her words mixed and has trouble completing sentences and uses paraphasic errors particularly when she is under stress or anxious or tries to talk fast. She is otherwise quite independent in activities of daily living. She is having some increasing difficulty with incontinence and has started wearing diapers at night. Her blood pressure is well controlled and today it is 140/76. She is tolerating Lipitor well without muscle aches or pains. She and her son have a lot of questions about her asymptomatic cerebral aneurysm and I discussed risk benefits of treatment and natural history of rupture with them     ROS:   14 system review of systems is positive for  Loss of vision, shortness of breath, speech difficulty and all other systems negative  PMH:  Past Medical History:  Diagnosis Date  . CAD (coronary artery disease)    S/P  anterior  wall myocardial infarction in '92, treated w/percutaneous transluminal coronary angioplasty of the left anterior descending. EF 45%  . Carotid artery disease ([place])   . DVT (deep venous thrombosis) ([place])    X1  . History of nuclear stress test    a. Myoview 6/17: EF 20-25%, mid anteroseptal, apical anterior, apical septal, apical inferior, apical lateral and apical scar, no ischemia, intermediate risk  . HTN (hypertension)   . Hyperlipidemia   . Hypothyroidism   . Ischemic cardiomyopathy    a. Echo 6/17: EF 20-25%, apex appears akinetic, MAC, moderate MR, moderate LAE, mild RVE, trivial PI, PASP 47 mmHg (needs repeat with Definity contrast) //  b. Limited echo with Definity contrast 7/17: EF 25-30%, moderate to severe LAE  . MI (myocardial infarction) ([place])  46   at age 63; TIA treated with coumadin until Plavix initiated in 2006  . PAD (peripheral artery disease) (H[facility])    Right SFA occlusion, severe disease left CFA and SFA  . Stroke [facility])     Social History:  Social  History   Socioeconomic History  . Marital status: Married    Spouse name: [name]  . Number of children: 1  . Years of education: college  . Highest education level: Not on file  Occupational History  . Occupation: Pharmacist, hospital    Comment: Retired  Scientific laboratory technician  . Financial resource strain: Not on file  . Food insecurity:    Worry: Not on file    Inability: Not on file  . Transportation needs:    Medical: Not on file    Non-medical: Not on file  Tobacco Use  . Smoking status: Former Smoker    Last attempt to quit: 03/11/1990    Years since quitting: 27.2  . Smokeless tobacco: Never Used  . Tobacco comment: smoked [place], up to < 5 cigarettes  Substance and Sexual Activity  . Alcohol use: No  . Drug use: No  . Sexual activity: Not on file  Lifestyle  . Physical activity:    Days per week: Not on file    Minutes per session: Not on file  . Stress: Not on file  Relationships  . Social connections:    Talks on phone: Not on file    Gets together: Not on file    Attends religious service: Not on file    Active member of club or organization: Not on file    Attends meetings of clubs or organizations: Not on file    Relationship status: Not on file  . Intimate partner violence:    Fear of current or ex partner: Not on file    Emotionally abused: Not on file    Physically abused: Not on file    Forced sexual activity: Not on file  Other Topics Concern  . 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 08/31/09- designated party form signed appointing, husband Julyssa Kyer; ok to leave msg on home phone [phone].      Caffeine Small Amount one cup very rare.   Right handed.   One child- [name]    Medications:   Current Outpatient Medications on File Prior to Visit  Medication Sig Dispense Refill  . amLODipine (NORVASC) 2.5 MG tablet Take 1 tablet (2.5 mg total) by mouth daily. 90 tablet 3  .  aspirin EC 81 MG tablet Take 1 tablet (81 mg total) by mouth daily. 90 tablet 3  . atorvastatin (LIPITOR) 80 MG tablet Take 1 tablet (80 mg total) by mouth daily. 30 tablet 0  . benazepril (LOTENSIN) 20 MG tablet Take 1.5 tablets (30 mg total) by mouth daily. 45 tablet 0  . dorzolamide-timolol (COSOPT) 22.3-6.8 MG/ML ophthalmic solution 1 drop 2 (two) times daily.    Marland Kitchen ELIQUIS 5 MG TABS tablet TAKE 1 TABLET TWICE DAILY 180 tablet 3  . furosemide (LASIX) 40 MG tablet Take one tablet by mouth daily as needed for swelling/weight gain 45 tablet 1  . levothyroxine (SYNTHROID, LEVOTHROID) 100 MCG tablet Take 100 mcg by mouth daily before breakfast.    . metoprolol succinate (TOPROL-XL) 50 MG 24 hr tablet TAKE 1 TABLET BY MOUTH TWICE DAILY WITH OR IMMEDIATELY FOLLOWING A MEAL 180 tablet 3  .  nitroGLYCERIN (NITROSTAT) 0.4 MG SL tablet Place 1 tablet (0.4 mg total) under the tongue every 5 (five) minutes as needed for chest pain. 25 tablet 1  . spironolactone (ALDACTONE) 25 MG tablet Take 1 tablet (25 mg total) by mouth daily. 30 tablet 0  . travoprost, benzalkonium, (TRAVATAN) 0.004 % ophthalmic solution Place 1 drop into both eyes at bedtime.     No current facility-administered medications on file prior to visit.     Allergies:   Allergies  Allergen Reactions  . Definity [Perflutren Lipid Microsphere] Other (See Comments)    Patient experienced back pain with injection  . Cilostazol Other (See Comments)     Pletal :" head felt funny"    Physical Exam General: pleasant elderly African-American lady, seated, in no evident distress Head: head normocephalic and atraumatic.  Neck: supple with no carotid or supraclavicular bruits Cardiovascular: regular rate and rhythm, no murmurs Musculoskeletal: no deformity Skin:  no rash/petichiae Vascular:  Normal pulses all extremities Vitals:   06/04/17 1437  BP: (!) 144/72  Pulse: 62   Neurologic Exam Mental Status: Awake and fully alert. Oriented  to place and time. Recent and remote memory intact. Attention span, concentration and fund of knowledge appropriate. Mood and affect appropriate. And occasional paraphasic errors and slight disfluency in speech. Good comprehension and repetition. Slightly impaired naming Cranial Nerves: Fundoscopic exam reveals sharp disc margins. Pupils equal, briskly reactive to light. Extraocular movements full without nystagmus. Visual fields full to confrontation. Hearing intact. Facial sensation intact. Face, tongue, palate moves normally and symmetrically.  Motor: Normal bulk and tone. Normal strength in all tested extremity muscles. Sensory.: intact to touch ,pinprick .position and vibratory sensation.  Coordination: Rapid alternating movements normal in all extremities. Finger-to-nose and heel-to-shin performed accurately bilaterally. Gait and Station: Arises from chair without difficulty. Stance is normal. Gait demonstrates normal stride length and balance . Able to heel, toe and tandem walk with slight difficulty.  Reflexes: 1+ and symmetric. Toes downgoing.   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 I also advised the patient to eat a healthy diet with plenty of whole grains, cereals, fruits and  vegetables, exercise regularly and maintain ideal body weight.We also talked about her asymptomatic cavernous carotid aneurysm and I recommend conservative follow-up for that at the present time. Recommend follow-up CT angiogram of the aneurysm at next visit in 3 months with my nurse practitioner  Yvonne Bailey.   Greater than 50% of time during this 25 minute visit was spent on counseling,explanation of diagnosis of embolic stroke, atrial fibrillation, asymptomatic cerebral aneurysm, planning of further management, discussion with patient and family and coordination of care Antony Contras, MD  [facility] [address] [place], [place] [address]  Phone [phone] Fax [phone] Note: This document was prepared with digital dictation and possible smart phrase technology. Any transcriptional errors that result from this process are unintentional

## 2017-06-06 ENCOUNTER — Ambulatory Visit: Payer: Medicare HMO | Admitting: Cardiovascular Disease

## 2017-06-06 NOTE — Progress Notes (Deleted)
No chief complaint on file.  History of Present Illness: 77 yo female with history of CAD, HTN, PAD, carotid artery disease, hyperlipidemia and paroxysmal atrial fibrillation here today for cardiac followup. In 1992 she had an anterior MI treated with PCI. Her ejection fraction in 2007 was 45%. She also has mild carotid disease by carotid dopplers 2017. She has LE arterial disease and is known to have occlusion of the right SFA and severe stenosis left CFA and SFA. ABI January 2017 with 0.65 on right and 0.72 on the left. She is on Eliquis for PAF. Echo June 2017 with fall in LVEF. Cath June 2017 with severe three vessel CAD. She was turned down for CABG. I performed PCI with placement of drug eluting stents in the proximal and mid RCA, proximal Circumflex, OM2, Diagonal 1 in June 2017. Echo 02/08/16 with LVEF=40-45%, moderate TR, trivial MR. She had a CVA in December 2018 felt to be due to non-compliance with Eliquis. Echo in December 2018 with bubble study but difficult to see windows and no estimate of LVEF. Lasix and Plavix stopped while she was hospitalized in December 2018.   She is here today for follow up. The patient denies any chest pain, dyspnea, palpitations, lower extremity edema, orthopnea, PND, dizziness, near syncope or syncope.   Primary Care Physician: Binnie Rail, MD  Past Medical History:  Diagnosis Date  . CAD (coronary artery disease)    S/P  anterior  wall myocardial infarction in '92, treated w/percutaneous transluminal coronary angioplasty of the left anterior descending. EF 45%  . Carotid artery disease ([place])   . DVT (deep venous thrombosis) ([place])    X1  . History of nuclear stress test    a. Myoview 6/17: EF 20-25%, mid anteroseptal, apical anterior, apical septal, apical inferior, apical lateral and apical scar, no ischemia, intermediate risk  . HTN (hypertension)   . Hyperlipidemia   . Hypothyroidism   . Ischemic cardiomyopathy    a. Echo 6/17: EF 20-25%,  apex appears akinetic, MAC, moderate MR, moderate LAE, mild RVE, trivial PI, PASP 47 mmHg (needs repeat with Definity contrast) //  b. Limited echo with Definity contrast 7/17: EF 25-30%, moderate to severe LAE  . MI (myocardial infarction) ([place]) 1992   at age 9; TIA treated with coumadin until Plavix initiated in 2006  . PAD (peripheral artery disease) (HCC)    Right SFA occlusion, severe disease left CFA and SFA  . Stroke [facility])     Past Surgical History:  Procedure Laterality Date  . APPENDECTOMY     at hysterectomy and USO for fibroids, Dr. Ysidro Evert  . CARDIAC CATHETERIZATION  1992   Dr Eustace Quail  . CARDIAC CATHETERIZATION N/A 09/06/2015   Procedure: Right/Left Heart Cath and Coronary Angiography;  Surgeon: Burnell Blanks, MD;  Location: [facility];  Service: Cardiovascular;  Laterality: N/A;  . CARDIAC CATHETERIZATION N/A 09/07/2015   Procedure: Coronary Stent Intervention;  Surgeon: Burnell Blanks, MD;  Location: [facility];  Service: Cardiovascular;  Laterality: N/A;  . COLONOSCOPY     negative; 2008, Dr. Delfin Edis  . fracture LLE     '94; pinned  . IR ANGIO INTRA EXTRACRAN SEL COM CAROTID INNOMINATE BILAT MOD SED  03/02/2017  . IR ANGIO VERTEBRAL SEL VERTEBRAL UNI R MOD SED  03/02/2017  . IR RADIOLOGIST EVAL & MGMT  04/28/2017  . RADIOLOGY WITH ANESTHESIA N/A 03/02/2017   Procedure: RADIOLOGY WITH ANESTHESIA;  Surgeon: Luanne Bras,  MD;  Location: [facility];  Service: Radiology;  Laterality: N/A;  . TONSILLECTOMY    . TOTAL ABDOMINAL HYSTERECTOMY     & BSO for fibroids    Current Outpatient Medications  Medication Sig Dispense Refill  . amLODipine (NORVASC) 2.5 MG tablet Take 1 tablet (2.5 mg total) by mouth daily. 90 tablet 3  . aspirin EC 81 MG tablet Take 1 tablet (81 mg total) by mouth daily. 90 tablet 3  . atorvastatin (LIPITOR) 80 MG tablet Take 1 tablet (80 mg total) by mouth daily. 30 tablet 0  . benazepril (LOTENSIN) 20 MG tablet  Take 1.5 tablets (30 mg total) by mouth daily. 45 tablet 0  . dorzolamide-timolol (COSOPT) 22.3-6.8 MG/ML ophthalmic solution 1 drop 2 (two) times daily.    Marland Kitchen ELIQUIS 5 MG TABS tablet TAKE 1 TABLET TWICE DAILY 180 tablet 3  . furosemide (LASIX) 40 MG tablet Take one tablet by mouth daily as needed for swelling/weight gain 45 tablet 1  . levothyroxine (SYNTHROID, LEVOTHROID) 100 MCG tablet Take 100 mcg by mouth daily before breakfast.    . metoprolol succinate (TOPROL-XL) 50 MG 24 hr tablet TAKE 1 TABLET BY MOUTH TWICE DAILY WITH OR IMMEDIATELY FOLLOWING A MEAL 180 tablet 3  . nitroGLYCERIN (NITROSTAT) 0.4 MG SL tablet Place 1 tablet (0.4 mg total) under the tongue every 5 (five) minutes as needed for chest pain. 25 tablet 1  . spironolactone (ALDACTONE) 25 MG tablet Take 1 tablet (25 mg total) by mouth daily. 30 tablet 0  . travoprost, benzalkonium, (TRAVATAN) 0.004 % ophthalmic solution Place 1 drop into both eyes at bedtime.     No current facility-administered medications for this visit.     Allergies  Allergen Reactions  . Definity [Perflutren Lipid Microsphere] Other (See Comments)    Patient experienced back pain with injection  . Cilostazol Other (See Comments)     Pletal :" head felt funny"    Social History   Socioeconomic History  . Marital status: Married    Spouse name: [name]  . Number of children: 1  . Years of education: college  . Highest education level: Not on file  Occupational History  . Occupation: Pharmacist, hospital    Comment: Retired  Scientific laboratory technician  . Financial resource strain: Not on file  . Food insecurity:    Worry: Not on file    Inability: Not on file  . Transportation needs:    Medical: Not on file    Non-medical: Not on file  Tobacco Use  . Smoking status: Former Smoker    Last attempt to quit: 03/11/1990    Years since quitting: 27.2  . Smokeless tobacco: Never Used  . Tobacco comment: smoked [place], up to < 5 cigarettes  Substance and Sexual  Activity  . Alcohol use: No  . Drug use: No  . Sexual activity: Not on file  Lifestyle  . Physical activity:    Days per week: Not on file    Minutes per session: Not on file  . Stress: Not on file  Relationships  . Social connections:    Talks on phone: Not on file    Gets together: Not on file    Attends religious service: Not on file    Active member of club or organization: Not on file    Attends meetings of clubs or organizations: Not on file    Relationship status: Not on file  . Intimate partner violence:    Fear of current or  ex partner: Not on file    Emotionally abused: Not on file    Physically abused: Not on file    Forced sexual activity: Not on file  Other Topics Concern  . 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 08/31/09- designated party form signed appointing, husband Hildagard Sobecki; ok to leave msg on home phone [phone].      Caffeine Small Amount one cup very rare.   Right handed.   One child- [name]    Family History  Problem Relation Age of Onset  . Diabetes Mother   . Hypertension Mother   . Stroke Brother        ?> 55  . Coronary artery disease Brother        stent in 16s  . Cancer Neg Hx     Review of Systems:  As stated in the HPI and otherwise negative.   There were no vitals taken for this visit.  Physical Examination:  General: Well developed, well nourished, NAD  HEENT: OP clear, mucus membranes moist  SKIN: warm, dry. No rashes. Neuro: No focal deficits  Musculoskeletal: Muscle strength 5/5 all ext  Psychiatric: Mood and affect normal  Neck: No JVD, no carotid bruits, no thyromegaly, no lymphadenopathy.  Lungs:Clear bilaterally, no wheezes, rhonci, crackles Cardiovascular: Regular rate and rhythm. No murmurs, gallops or rubs. Abdomen:Soft. Bowel sounds present. Non-tender.  Extremities: No lower extremity edema. Pulses are 2 + in the bilateral  DP/PT. EKG:  EKG is *** ordered today. The ekg ordered today demonstrates   Recent Labs: 07/09/2016: Pro B Natriuretic peptide (BNP) 341.0 02/04/2017: TSH 4.34 03/17/2017: BUN 13; Creatinine, Ser 0.97; Hemoglobin 12.4; Platelets 284; Potassium 3.4; Sodium 140 06/03/2017: ALT 18   Lipid Panel    Component Value Date/Time   CHOL 131 06/03/2017 0921   TRIG 94 06/03/2017 0921   HDL 40 06/03/2017 0921   CHOLHDL 3.3 06/03/2017 0921   CHOLHDL 3.4 03/03/2017 0551   VLDL 12 03/03/2017 0551   LDLCALC 72 06/03/2017 0921     Wt Readings from Last 3 Encounters:  06/04/17 221 lb 6.4 oz (100.4 kg)  04/21/17 220 lb 6.4 oz (100 kg)  03/31/17 206 lb (93.4 kg)     Other studies Reviewed: Additional studies/ records that were reviewed today include: . Review of the above records demonstrates:   Assessment and Plan:   1. Chronic systolic CHF: Weight is stable off of Lasix. She does not appear to be volume overloaded.   2. CAD without angina: She is s/p multivessel stenting in June 2017 after being found to have severe triple vessel CAD and not felt to be a candidate for CABG. Stents were placed in the ostial and mid RCA, proximal Circumflex, OM2 and first Diagonal. No chest pain. Continue ASA, statin and beta blocker.     3. Ischemic CM: LV systolic function improved post PCI, LVEF up to 45%. Continue beta blocker, Ace inh and aldactone.    4. Persistent Atrial Fibrillation: Rate controlled on Toprol. Continue Eliquis.   5. HTN: BP is controlled. NO changes  6. Hyperlipidemia: Lipids controlled. Continue statin.   Current medicines are reviewed at length with the patient today.  The patient does not have concerns regarding medicines.  The following changes have been made:    Labs/ tests ordered today include:   No orders of the defined types were placed in this encounter.   Disposition:  FU with me in 6 months  Signed, Lauree Chandler, MD 06/06/2017 5:15 AM    [place], [place], [place]  [address] Phone: [phone]; Fax: [phone]

## 2017-06-13 ENCOUNTER — Ambulatory Visit: Payer: Medicare HMO | Admitting: Physical Medicine & Rehabilitation

## 2017-06-19 ENCOUNTER — Encounter: Payer: Medicare HMO | Attending: Physical Medicine & Rehabilitation | Admitting: Physical Medicine & Rehabilitation

## 2017-06-19 DIAGNOSIS — I739 Peripheral vascular disease, unspecified: Secondary | ICD-10-CM | POA: Insufficient documentation

## 2017-06-19 DIAGNOSIS — I255 Ischemic cardiomyopathy: Secondary | ICD-10-CM | POA: Insufficient documentation

## 2017-06-19 DIAGNOSIS — R32 Unspecified urinary incontinence: Secondary | ICD-10-CM | POA: Insufficient documentation

## 2017-06-19 DIAGNOSIS — I252 Old myocardial infarction: Secondary | ICD-10-CM | POA: Insufficient documentation

## 2017-06-19 DIAGNOSIS — Z8249 Family history of ischemic heart disease and other diseases of the circulatory system: Secondary | ICD-10-CM | POA: Insufficient documentation

## 2017-06-19 DIAGNOSIS — R350 Frequency of micturition: Secondary | ICD-10-CM | POA: Insufficient documentation

## 2017-06-19 DIAGNOSIS — R269 Unspecified abnormalities of gait and mobility: Secondary | ICD-10-CM | POA: Insufficient documentation

## 2017-06-19 DIAGNOSIS — Z7901 Long term (current) use of anticoagulants: Secondary | ICD-10-CM | POA: Insufficient documentation

## 2017-06-19 DIAGNOSIS — Z823 Family history of stroke: Secondary | ICD-10-CM | POA: Insufficient documentation

## 2017-06-19 DIAGNOSIS — I69320 Aphasia following cerebral infarction: Secondary | ICD-10-CM | POA: Insufficient documentation

## 2017-06-19 DIAGNOSIS — Z833 Family history of diabetes mellitus: Secondary | ICD-10-CM | POA: Insufficient documentation

## 2017-06-19 DIAGNOSIS — Z9861 Coronary angioplasty status: Secondary | ICD-10-CM | POA: Insufficient documentation

## 2017-06-19 DIAGNOSIS — Z90710 Acquired absence of both cervix and uterus: Secondary | ICD-10-CM | POA: Insufficient documentation

## 2017-06-19 DIAGNOSIS — E785 Hyperlipidemia, unspecified: Secondary | ICD-10-CM | POA: Insufficient documentation

## 2017-06-19 DIAGNOSIS — I2510 Atherosclerotic heart disease of native coronary artery without angina pectoris: Secondary | ICD-10-CM | POA: Insufficient documentation

## 2017-06-19 DIAGNOSIS — E039 Hypothyroidism, unspecified: Secondary | ICD-10-CM | POA: Insufficient documentation

## 2017-06-19 DIAGNOSIS — Z90722 Acquired absence of ovaries, bilateral: Secondary | ICD-10-CM | POA: Insufficient documentation

## 2017-06-19 DIAGNOSIS — I10 Essential (primary) hypertension: Secondary | ICD-10-CM | POA: Insufficient documentation

## 2017-06-19 DIAGNOSIS — Z87891 Personal history of nicotine dependence: Secondary | ICD-10-CM | POA: Insufficient documentation

## 2017-06-19 DIAGNOSIS — Z86718 Personal history of other venous thrombosis and embolism: Secondary | ICD-10-CM | POA: Insufficient documentation

## 2017-07-02 ENCOUNTER — Encounter: Payer: Self-pay | Admitting: Physician Assistant

## 2017-07-02 NOTE — Progress Notes (Deleted)
Cardiology Office Note    Date:  07/02/2017  ID:  Yvonne Bailey, DOB [birth year], MRN [id number] PCP:  Binnie Rail, MD  Cardiologist:  Dr. Angelena Form   Chief Complaint: f/u afib, CHF  History of Present Illness:  Yvonne Bailey is a 77 y.o. female with history of CAD (anterior MI s/p PCI in 1992, cath 08/2015 with severe three-vessel CAD turned down for CABG and underwent DESx5 to prox TI/RW4/R1/VQMG/QQPY) chronic systolic CHF/ICM, moderate TR, PAD (occlusion of the right SFA and severe stenosis of the left CFA and SFA), persistent (likely chronic) atrial fib on Eliquis, carotid artery disease, HLD, prior TIA, stroke 2018 who presents for follow-up.  Last cath was in 08/2015 at time of PCI as above. Plan was to continue ASA/Plavix/Eliquis and drop aspirin after 1 month. EF variable over the years, 45% in 2007, 20-25% in 08/2015, and 40-45% in 01/2016 (with moderate TR at that time). In 02/2017 she had an acute CVA involving the left middle cerebral artery felt secondary to atrial fibrillation and noncompliance with Eliquis. She underwent inpatient rehab stay and has resultant expressive aphasia. Echo was done while in the hospital with bubble study but difficult to see windows and EF not estimated. Lasix and Plavix were stopped by neurology. At last OV 04/2017 with Ermalinda Barrios, the family was concerned as the patient was not taking an interest in taking her medicines regularly. She preferred to remain off Lasix as she had trouble controlling her bladder. [name] given extensive stenting hx. She was in atrial fib at that visit and was managed with rate control. Of note, last carotid duplex 03/2015 showed 1-39% BICA, normal subclavian arteries, chronically occluded left vertebral, f/u recommended only PRN. Last labs 05/2017 showed Alk phos elevated 130 but otherwise normal LFTs, LDL 72, 03/2017 showed Hgb 12.4, K 3.4, Cr  0.97.  alk phos up bmet low K    CAD Longstanding persistent atrial fibrillation Chronic systolic CHF PAD Hypokalemia    Past Medical History:  Diagnosis Date  . CAD (coronary artery disease)    a. anterior MI s/p PCI in 1992. b. cath 08/2015 with severe three-vessel CAD turned down for CABG and underwent DESx5 to prox Cx/OM2/D1/oRCA/mRCA  . Carotid artery disease ([place])    a. carotid duplex 03/2015 showed 1-39% BICA, normal subclavian arteries, chronically occluded left vertebral, f/u recommended only PRN.  . DVT (deep venous thrombosis) ([place])    X1  . History of nuclear stress test    a. Myoview 6/17: EF 20-25%, mid anteroseptal, apical anterior, apical septal, apical inferior, apical lateral and apical scar, no ischemia, intermediate risk  . HTN (hypertension)   . Hyperlipidemia   . Hypokalemia   . Hypothyroidism   . Ischemic cardiomyopathy    a. Echo 6/17: EF 20-25%, apex appears akinetic, MAC, moderate MR, moderate LAE, mild RVE, trivial PI, PASP 47 mmHg (needs repeat with Definity contrast).  b. Limited echo with Definity contrast 7/17: EF 25-30%, moderate to severe LAE. c. Limited Echo 2018 showed EF 40-45%.  . Longstanding persistent atrial fibrillation ([place])   . MI (myocardial infarction) ([place]) 1992  . PAD (peripheral artery disease) ([facility])    Right SFA occlusion, severe disease left CFA and SFA  . Stroke [facility])    a. 02/2017 in setting of noncompliance with Eliquis  . TIA (transient ischemic attack)   . Tricuspid regurgitation     Past Surgical  History:  Procedure Laterality Date  . APPENDECTOMY     at hysterectomy and USO for fibroids, Dr. Ysidro Evert  . CARDIAC CATHETERIZATION  1992   Dr Eustace Quail  . CARDIAC CATHETERIZATION N/A 09/06/2015   Procedure: Right/Left Heart Cath and Coronary Angiography;  Surgeon: Burnell Blanks, MD;  Location: [facility];  Service: Cardiovascular;  Laterality: N/A;  . CARDIAC CATHETERIZATION N/A 09/07/2015   Procedure:  Coronary Stent Intervention;  Surgeon: Burnell Blanks, MD;  Location: [facility];  Service: Cardiovascular;  Laterality: N/A;  . COLONOSCOPY     negative; 2008, Dr. Delfin Edis  . fracture LLE     '94; pinned  . IR ANGIO INTRA EXTRACRAN SEL COM CAROTID INNOMINATE BILAT MOD SED  03/02/2017  . IR ANGIO VERTEBRAL SEL VERTEBRAL UNI R MOD SED  03/02/2017  . IR RADIOLOGIST EVAL & MGMT  04/28/2017  . RADIOLOGY WITH ANESTHESIA N/A 03/02/2017   Procedure: RADIOLOGY WITH ANESTHESIA;  Surgeon: Luanne Bras, MD;  Location: [facility];  Service: Radiology;  Laterality: N/A;  . TONSILLECTOMY    . TOTAL ABDOMINAL HYSTERECTOMY     & BSO for fibroids    Current Medications: No outpatient medications have been marked as taking for the 07/03/17 encounter (Appointment) with Charlie Pitter, PA-C.   ***   Allergies:   Definity [perflutren lipid microsphere] and Cilostazol   Social History   Socioeconomic History  . Marital status: Married    Spouse name: [name]  . Number of children: 1  . Years of education: college  . Highest education level: Not on file  Occupational History  . Occupation: Pharmacist, hospital    Comment: Retired  Scientific laboratory technician  . Financial resource strain: Not on file  . Food insecurity:    Worry: Not on file    Inability: Not on file  . Transportation needs:    Medical: Not on file    Non-medical: Not on file  Tobacco Use  . Smoking status: Former Smoker    Last attempt to quit: 03/11/1990    Years since quitting: 27.3  . Smokeless tobacco: Never Used  . Tobacco comment: smoked [place], up to < 5 cigarettes  Substance and Sexual Activity  . Alcohol use: No  . Drug use: No  . Sexual activity: Not on file  Lifestyle  . Physical activity:    Days per week: Not on file    Minutes per session: Not on file  . Stress: Not on file  Relationships  . Social connections:    Talks on phone: Not on file    Gets together: Not on file    Attends religious service: Not on  file    Active member of club or organization: Not on file    Attends meetings of clubs or organizations: Not on file    Relationship status: Not on file  Other Topics Concern  . 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 08/31/09- designated party form signed appointing, husband Mallisa Alameda; ok to leave msg on home phone [phone].      Caffeine Small Amount one cup very rare.   Right handed.   One child- [name]     Family History:  Family History  Problem Relation Age of Onset  . Diabetes Mother   . Hypertension Mother   . Stroke Brother        ?> 55  . Coronary  artery disease Brother        stent in 58s  . Cancer Neg Hx    ***  ROS:   Please see the history of present illness. Otherwise, review of systems is positive for ***.  All other systems are reviewed and otherwise negative.    PHYSICAL EXAM:   VS:  There were no vitals taken for this visit.  BMI: There is no height or weight on file to calculate BMI. GEN: Well nourished, well developed, in no acute distress  HEENT: normocephalic, atraumatic Neck: no JVD, carotid bruits, or masses Cardiac: ***RRR; no murmurs, rubs, or gallops, no edema  Respiratory:  clear to auscultation bilaterally, normal work of breathing GI: soft, nontender, nondistended, + BS MS: no deformity or atrophy  Skin: warm and dry, no rash Neuro:  Alert and Oriented x 3, Strength and sensation are intact, follows commands Psych: euthymic mood, full affect  Wt Readings from Last 3 Encounters:  06/04/17 221 lb 6.4 oz (100.4 kg)  04/21/17 220 lb 6.4 oz (100 kg)  03/31/17 206 lb (93.4 kg)      Studies/Labs Reviewed:   EKG:  EKG was ordered today and personally reviewed by me and demonstrates *** EKG was not ordered today.***  Recent Labs: 07/09/2016: Pro B Natriuretic peptide (BNP) 341.0 02/04/2017: TSH 4.34 03/17/2017: BUN 13; Creatinine, Ser 0.97; Hemoglobin 12.4;  Platelets 284; Potassium 3.4; Sodium 140 06/03/2017: ALT 18   Lipid Panel    Component Value Date/Time   CHOL 131 06/03/2017 0921   TRIG 94 06/03/2017 0921   HDL 40 06/03/2017 0921   CHOLHDL 3.3 06/03/2017 0921   CHOLHDL 3.4 03/03/2017 0551   VLDL 12 03/03/2017 0551   LDLCALC 72 06/03/2017 0921    Additional studies/ records that were reviewed today include: Summarized above.***    ASSESSMENT & PLAN:   1. ***  Disposition: F/u with ***   Medication Adjustments/Labs and Tests Ordered: Current medicines are reviewed at length with the patient today.  Concerns regarding medicines are outlined above. Medication changes, Labs and Tests ordered today are summarized above and listed in the Patient Instructions accessible in Encounters.   Signed, Charlie Pitter, PA-C  07/02/2017 1:19 PM    [facility] [place], [place], [place]  [address] Phone: [phone]; Fax: [phone]

## 2017-07-03 ENCOUNTER — Telehealth: Payer: Self-pay | Admitting: Emergency Medicine

## 2017-07-03 ENCOUNTER — Ambulatory Visit: Payer: Commercial Managed Care - HMO | Admitting: Physician Assistant

## 2017-07-03 NOTE — Telephone Encounter (Signed)
Called patient to schedule AWV. Patient did not answer and no voicemail picked up.

## 2017-07-14 ENCOUNTER — Encounter: Payer: Self-pay | Admitting: Cardiology

## 2017-07-15 ENCOUNTER — Ambulatory Visit: Payer: Medicare HMO | Admitting: Cardiology

## 2017-07-15 ENCOUNTER — Encounter: Payer: Self-pay | Admitting: Cardiology

## 2017-07-15 VITALS — BP 122/66 | HR 66 | Ht 66.0 in | Wt 214.8 lb

## 2017-07-15 DIAGNOSIS — R600 Localized edema: Secondary | ICD-10-CM | POA: Diagnosis not present

## 2017-07-15 DIAGNOSIS — I5022 Chronic systolic (congestive) heart failure: Secondary | ICD-10-CM | POA: Diagnosis not present

## 2017-07-15 NOTE — Patient Instructions (Addendum)
Medication Instructions:   Your physician recommends that you continue on your current medications as directed. Please refer to the Current Medication list given to you today.   If you need a refill on your cardiac medications before your next appointment, please call your pharmacy.  Labwork:  BNP AND BMET  TODAY     Testing/Procedures: NONE ORDERED  TODAY    Follow-Up:  AS SCHEDULED    Any Other Special Instructions Will Be Listed Below (If Applicable).  CHECK WEIGHT  DAILY  CONTACT CLINIC IF GAIN 3 LBS IN 24 HOURS OR 5 LBS A WEEK     Low-Sodium Eating Plan Sodium, which is an element that makes up salt, helps you maintain a healthy balance of fluids in your body. Too much sodium can increase your blood pressure and cause fluid and waste to be held in your body. Your health care provider or dietitian may recommend following this plan if you have high blood pressure (hypertension), kidney disease, liver disease, or heart failure. Eating less sodium can help lower your blood pressure, reduce swelling, and protect your heart, liver, and kidneys. What are tips for following this plan? General guidelines  Most people on this plan should limit their sodium intake to 1,500-2,000 mg (milligrams) of sodium each day. Reading food labels  The Nutrition Facts label lists the amount of sodium in one serving of the food. If you eat more than one serving, you must multiply the listed amount of sodium by the number of servings.  Choose foods with less than 140 mg of sodium per serving.  Avoid foods with 300 mg of sodium or more per serving. Shopping  Look for lower-sodium products, often labeled as "low-sodium" or "no salt added."  Always check the sodium content even if foods are labeled as "unsalted" or "no salt added".  Buy fresh foods. ? Avoid canned foods and premade or frozen meals. ? Avoid canned, cured, or processed meats  Buy breads that have less than 80 mg of sodium per  slice. Cooking  Eat more home-cooked food and less restaurant, buffet, and fast food.  Avoid adding salt when cooking. Use salt-free seasonings or herbs instead of table salt or sea salt. Check with your health care provider or pharmacist before using salt substitutes.  Cook with plant-based oils, such as canola, sunflower, or olive oil. Meal planning  When eating at a restaurant, ask that your food be prepared with less salt or no salt, if possible.  Avoid foods that contain MSG (monosodium glutamate). MSG is sometimes added to [place] food, bouillon, and some canned foods. What foods are recommended? The items listed may not be a complete list. Talk with your dietitian about what dietary choices are best for you. Grains Low-sodium cereals, including oats, puffed wheat and rice, and shredded wheat. Low-sodium crackers. Unsalted rice. Unsalted pasta. Low-sodium bread. Whole-grain breads and whole-grain pasta. Vegetables Fresh or frozen vegetables. "No salt added" canned vegetables. "No salt added" tomato sauce and paste. Low-sodium or reduced-sodium tomato and vegetable juice. Fruits Fresh, frozen, or canned fruit. Fruit juice. Meats and other protein foods Fresh or frozen (no salt added) meat, poultry, seafood, and fish. Low-sodium canned tuna and salmon. Unsalted nuts. Dried peas, beans, and lentils without added salt. Unsalted canned beans. Eggs. Unsalted nut butters. Dairy Milk. Soy milk. Cheese that is naturally low in sodium, such as ricotta cheese, fresh mozzarella, or Swiss cheese Low-sodium or reduced-sodium cheese. Cream cheese. Yogurt. Fats and oils Unsalted butter. Unsalted margarine with  no trans fat. Vegetable oils such as canola or olive oils. Seasonings and other foods Fresh and dried herbs and spices. Salt-free seasonings. Low-sodium mustard and ketchup. Sodium-free salad dressing. Sodium-free light mayonnaise. Fresh or refrigerated horseradish. Lemon juice. Vinegar.  Homemade, reduced-sodium, or low-sodium soups. Unsalted popcorn and pretzels. Low-salt or salt-free chips. What foods are not recommended? The items listed may not be a complete list. Talk with your dietitian about what dietary choices are best for you. Grains Instant hot cereals. Bread stuffing, pancake, and biscuit mixes. Croutons. Seasoned rice or pasta mixes. Noodle soup cups. Boxed or frozen macaroni and cheese. Regular salted crackers. Self-rising flour. Vegetables Sauerkraut, pickled vegetables, and relishes. Olives. [place] fries. Onion rings. Regular canned vegetables (not low-sodium or reduced-sodium). Regular canned tomato sauce and paste (not low-sodium or reduced-sodium). Regular tomato and vegetable juice (not low-sodium or reduced-sodium). Frozen vegetables in sauces. Meats and other protein foods Meat or fish that is salted, canned, smoked, spiced, or pickled. Bacon, ham, sausage, hotdogs, corned beef, chipped beef, packaged lunch meats, salt pork, jerky, pickled herring, anchovies, regular canned tuna, sardines, salted nuts. Dairy Processed cheese and cheese spreads. Cheese curds. Blue cheese. Feta cheese. String cheese. Regular cottage cheese. Buttermilk. Canned milk. Fats and oils Salted butter. Regular margarine. Ghee. Bacon fat. Seasonings and other foods Onion salt, garlic salt, seasoned salt, table salt, and sea salt. Canned and packaged gravies. Worcestershire sauce. Tartar sauce. Barbecue sauce. Teriyaki sauce. Soy sauce, including reduced-sodium. Steak sauce. Fish sauce. Oyster sauce. Cocktail sauce. Horseradish that you find on the shelf. Regular ketchup and mustard. Meat flavorings and tenderizers. Bouillon cubes. Hot sauce and Tabasco sauce. Premade or packaged marinades. Premade or packaged taco seasonings. Relishes. Regular salad dressings. Salsa. Potato and tortilla chips. Corn chips and puffs. Salted popcorn and pretzels. Canned or dried soups. Pizza. Frozen entrees and  pot pies. Summary  Eating less sodium can help lower your blood pressure, reduce swelling, and protect your heart, liver, and kidneys.  Most people on this plan should limit their sodium intake to 1,500-2,000 mg (milligrams) of sodium each day.  Canned, boxed, and frozen foods are high in sodium. Restaurant foods, fast foods, and pizza are also very high in sodium. You also get sodium by adding salt to food.  Try to cook at home, eat more fresh fruits and vegetables, and eat less fast food, canned, processed, or prepared foods. This information is not intended to replace advice given to you by your health care provider. Make sure you discuss any questions you have with your health care provider. Document Released: 08/17/2001 Document Revised: 02/19/2016 Document Reviewed: 02/19/2016 Elsevier Interactive Patient Education  [employer].

## 2017-07-15 NOTE — Progress Notes (Signed)
07/15/2017 Yvonne Bailey   1940/10/28  [id number]  Primary Physician Quay Burow, Claudina Lick, MD Primary Cardiologist: Dr. Angelena Form   Reason for Visit/CC: Follow-up for systolic heart failure  HPI:  Yvonne Bailey is a 77 y.o. female who is being seen today for follow-up for chronic systolic heart failure.  She has a history of CAD.  She had an anterior wall MI treated with PCI in 1992.  In June 2017, she underwent repeat cardiac catheterization for chest pain which showed severe three-vessel CAD however she was turned down for CABG and ultimately underwent drug-eluting stent placement x 4 to the proximal to mid RCA, proximal circumflex, OM 2 and diagonal 1.  Echocardiogram at that time showed mildly reduced left ventricular EF down to 40 to 45% with moderate TR and trivial MR.  She also has history of atrial fibrillation on Eliquis, carotid artery disease, PAD with occlusion of the right SFA and severe stenosis of the left  SFA as well as history of hypertension.  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  She is here today in clinic with her son for routine follow-up.  She has been doing fairly well from a cardiac standpoint.  She denies chest pain.  No dyspnea.  The main concern discussed at her visit today is in regards to medication compliance with Lasix.  Her son reports that she has been fully compliant with Eliquis twice daily but there is difficulty complying with diuretics due to the inconvenience of excessive urination.  The patient's son has been trying to get her to improve compliance.  She is scheduled to take 40 mg daily as needed based on weight and edema.  Patient admits that she does not always check her weight daily.  Her son has been trying to help her maintain a low-sodium diet and she has done fairly well with this.  Her son  reports that the patient has been taking Lasix daily for the past 3 days primarily because she knew that she was coming in for today's office visit.  She has trace ankle edema on exam however nothing significant.  She is also on amlodipine for hypertension, which may be contributing.  Her lungs are clear to auscultation bilaterally.  Atrial fibrillation is noted on physical examination however heart rate is well controlled and she is asymptomatic.  Her blood pressure is well controlled on current regimen.   Current Meds  Medication Sig  . amLODipine (NORVASC) 2.5 MG tablet Take 1 tablet (2.5 mg total) by mouth daily.  Marland Kitchen aspirin EC 81 MG tablet Take 1 tablet (81 mg total) by mouth daily.  . benazepril (LOTENSIN) 20 MG tablet Take 1.5 tablets (30 mg total) by mouth daily.  . dorzolamide-timolol (COSOPT) 22.3-6.8 MG/ML ophthalmic solution 1 drop 2 (two) times daily.  Marland Kitchen ELIQUIS 5 MG TABS tablet TAKE 1 TABLET TWICE DAILY  . furosemide (LASIX) 40 MG tablet Take one tablet by mouth daily as needed for swelling/weight gain  . levothyroxine (SYNTHROID, LEVOTHROID) 100 MCG tablet Take 100 mcg by mouth daily before breakfast.  . metoprolol succinate (TOPROL-XL) 50 MG 24 hr tablet TAKE 1 TABLET BY MOUTH TWICE DAILY WITH OR IMMEDIATELY FOLLOWING A MEAL  . nitroGLYCERIN (NITROSTAT) 0.4 MG SL tablet Place 1 tablet (0.4 mg total) under the tongue every 5 (five) minutes as needed for chest pain.  Marland Kitchen spironolactone (  ALDACTONE) 25 MG tablet Take 1 tablet (25 mg total) by mouth daily.  . travoprost, benzalkonium, (TRAVATAN) 0.004 % ophthalmic solution Place 1 drop into both eyes at bedtime.   Allergies  Allergen Reactions  . Definity [Perflutren Lipid Microsphere] Other (See Comments)    Patient experienced back pain with injection  . Cilostazol Other (See Comments)     Pletal :" head felt funny"   Past Medical History:  Diagnosis Date  . CAD (coronary artery disease)    a. anterior MI s/p PCI in 1992. b. cath  08/2015 with severe three-vessel CAD turned down for CABG and underwent DESx5 to prox Cx/OM2/D1/oRCA/mRCA  . Carotid artery disease ([place])    a. carotid duplex 03/2015 showed 1-39% BICA, normal subclavian arteries, chronically occluded left vertebral, f/u recommended only PRN.  . DVT (deep venous thrombosis) ([place])    X1  . History of nuclear stress test    a. Myoview 6/17: EF 20-25%, mid anteroseptal, apical anterior, apical septal, apical inferior, apical lateral and apical scar, no ischemia, intermediate risk  . HTN (hypertension)   . Hyperlipidemia   . Hypokalemia   . Hypothyroidism   . Ischemic cardiomyopathy    a. Echo 6/17: EF 20-25%, apex appears akinetic, MAC, moderate MR, moderate LAE, mild RVE, trivial PI, PASP 47 mmHg (needs repeat with Definity contrast).  b. Limited echo with Definity contrast 7/17: EF 25-30%, moderate to severe LAE. c. Limited Echo 2018 showed EF 40-45%.  . Longstanding persistent atrial fibrillation ([place])   . MI (myocardial infarction) ([place]) 1992  . PAD (peripheral artery disease) (HCC)    Right SFA occlusion, severe disease left CFA and SFA  . Stroke [facility])    a. 02/2017 in setting of noncompliance with Eliquis  . TIA (transient ischemic attack)   . Tricuspid regurgitation    Family History  Problem Relation Age of Onset  . Diabetes Mother   . Hypertension Mother   . Stroke Brother        ?> 55  . Coronary artery disease Brother        stent in 13s  . Cancer Neg Hx    Past Surgical History:  Procedure Laterality Date  . APPENDECTOMY     at hysterectomy and USO for fibroids, Dr. Ysidro Evert  . CARDIAC CATHETERIZATION  1992   Dr Eustace Quail  . CARDIAC CATHETERIZATION N/A 09/06/2015   Procedure: Right/Left Heart Cath and Coronary Angiography;  Surgeon: Burnell Blanks, MD;  Location: [place] [facility];  Service: Cardiovascular;  Laterality: N/A;  . CARDIAC CATHETERIZATION N/A 09/07/2015   Procedure: Coronary Stent Intervention;  Surgeon:  Burnell Blanks, MD;  Location: [place] [facility];  Service: Cardiovascular;  Laterality: N/A;  . COLONOSCOPY     negative; 2008, Dr. Delfin Edis  . fracture LLE     '94; pinned  . IR ANGIO INTRA EXTRACRAN SEL COM CAROTID INNOMINATE BILAT MOD SED  03/02/2017  . IR ANGIO VERTEBRAL SEL VERTEBRAL UNI R MOD SED  03/02/2017  . IR RADIOLOGIST EVAL & MGMT  04/28/2017  . RADIOLOGY WITH ANESTHESIA N/A 03/02/2017   Procedure: RADIOLOGY WITH ANESTHESIA;  Surgeon: Luanne Bras, MD;  Location: [place];  Service: Radiology;  Laterality: N/A;  . TONSILLECTOMY    . TOTAL ABDOMINAL HYSTERECTOMY     & BSO for fibroids   Social History   Socioeconomic History  . Marital status: Married    Spouse name: [name]  . Number of children: 1  . Years of  education: college  . Highest education level: Not on file  Occupational History  . Occupation: Pharmacist, hospital    Comment: Retired  Scientific laboratory technician  . Financial resource strain: Not on file  . Food insecurity:    Worry: Not on file    Inability: Not on file  . Transportation needs:    Medical: Not on file    Non-medical: Not on file  Tobacco Use  . Smoking status: Former Smoker    Last attempt to quit: 03/11/1990    Years since quitting: 27.3  . Smokeless tobacco: Never Used  . Tobacco comment: smoked [place], up to < 5 cigarettes  Substance and Sexual Activity  . Alcohol use: No  . Drug use: No  . Sexual activity: Not on file  Lifestyle  . Physical activity:    Days per week: Not on file    Minutes per session: Not on file  . Stress: Not on file  Relationships  . Social connections:    Talks on phone: Not on file    Gets together: Not on file    Attends religious service: Not on file    Active member of club or organization: Not on file    Attends meetings of clubs or organizations: Not on file    Relationship status: Not on file  . Intimate partner violence:    Fear of current or ex partner: Not on file    Emotionally abused: Not on  file    Physically abused: Not on file    Forced sexual activity: Not on file  Other Topics Concern  . 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 08/31/09- designated party form signed appointing, husband Gertrude Bucks; ok to leave msg on home phone [phone].      Caffeine Small Amount one cup very rare.   Right handed.   One child- [name]     Review of Systems: General: negative for chills, fever, night sweats or weight changes.  Cardiovascular: negative for chest pain, dyspnea on exertion, edema, orthopnea, palpitations, paroxysmal nocturnal dyspnea or shortness of breath Dermatological: negative for rash Respiratory: negative for cough or wheezing Urologic: negative for hematuria Abdominal: negative for nausea, vomiting, diarrhea, bright red blood per rectum, melena, or hematemesis Neurologic: negative for visual changes, syncope, or dizziness All other systems reviewed and are otherwise negative except as noted above.   Physical Exam:  Blood pressure 122/66, pulse 66, height _0  (1.676 m), weight 214 lb 12.8 oz (97.4 kg), SpO2 99 %.  General appearance: alert, cooperative and no distress Neck: no carotid bruit and no JVD Lungs: clear to auscultation bilaterally Heart: irregularly irregular rhythm and regular rate Extremities: trace bilateral LEE Pulses: 2+ and symmetric Skin: Skin color, texture, turgor normal. No rashes or lesions Neurologic: Grossly normal  EKG not performed -- personally reviewed   ASSESSMENT AND PLAN:   1.  Chronic systolic heart failure: Pt does not appear to be significantly grossly volume overloaded on exam but she does have trace bilateral ankle edema.  She is prescribed only to take Lasix as needed based on edema and daily weights, however she has not been compliant with daily weights.  Her son has concerns regarding her volume status.  We will check a BNP today for  further evaluation.  If elevated, we will have her take Lasix for several days for diuresis.  If within normal limits, she can continue to  take as needed based on edema and daily weights.  We discussed the importance of adherence to daily weights to monitor volume status.  The patient and her son have been informed to take Lasix if greater than 3 pounds in 24 hours or greater than 5 pound weight gain in the course of 1 week.  We also discussed the importance of low-sodium diet.  2.  CAD:  multivessel stenting in 2017 as outlined above, after being turned down for CABG. She is stable without chest pain.  Continue medical management.  3.  Atrial fibrillation:  rate is controlled with metoprolol.  She is on Eliquis for anticoagulation.  She is asymptomatic.  4.  History of CVA: Stroke occurred in December 2018 in the setting of atrial fibrillation and noncompliance with Eliquis.  The patient has improved compliance with Eliquis.  She has residual expressive dysphasia.  5.  Hypertension: Controlled on current regimen, 122/66.   6.  PAD: No complaints of claudication.  Continue medical management with aspirin and statin.  Follow-Up: keep f/u with Dr. Angelena Form in September  Rivaan Kendall Ladoris Gene, MHS [facility] 07/15/2017 3:47 PM

## 2017-08-05 NOTE — Progress Notes (Signed)
Subjective:    Patient ID: Yvonne Bailey, female    DOB: 01-12-1941, 77 y.o.   MRN: [id number]  HPI The patient is here for follow up.  Her son is here with her.  H/o CVA with residual expressive aphasia, poor balance, mild weakness: Overall she is doing well.  Her son estimates that she is improved 83% since her stroke.  She is doing minimal exercise, but also fairly sedentary the rest of the day.  She walks to the mailbox once a day.  She walks a little in her house to go to the bathroom.  He encourages her to work on her fine motor skills, but she does not always do this.  CAD, Afib, chronic CHF, Hypertension: she does not weigh herself daily.  She is taking her medication daily, except her water pills which she is not taking regularly.  She can go several days without taking it.  Her son often reminds her that she has increasing leg edema and look short of breath with exertion and he tries to get her to take it more regularly. She is compliant with a low sodium diet.  She denies frequent shortness of breath, but her son disagrees she denies chest pain, palpitations, and regular headaches. She is exercising minimally.  She does not monitor her blood pressure at home.    Leg edema:  She is taking lasix as needed, but not as often as she should prefer son.  She does not weigh herself daily.  Hyperlipidemia: She is taking her medication daily. She is compliant with a low fat/cholesterol diet. She is exercising minimally. She denies myalgias.   Hypothyroidism:  She is taking her medication daily.  She denies any recent changes in energy or weight that are unexplained.   Prediabetes:  She is compliant with a low sugar/carbohydrate diet.  She is exercising some.  CKD: She does not take any NSAIDs.  She denies changes in urination.  Medications and allergies reviewed with patient and updated if appropriate.  Patient Active Problem List   Diagnosis Date Noted  . Leukocytosis   . Aphasia  as late effect of cerebrovascular accident (CVA)   . Acute ischemic cerebrovascular accident (CVA) involving left middle cerebral artery territory [facility])   . Expressive aphasia   . Coronary artery disease involving native coronary artery without angina pectoris   . Stage 3 chronic kidney disease ([place])   . Stroke (cerebrum) ([place]) 03/02/2017  . Prediabetes 07/28/2016  . DOE (dyspnea on exertion) 07/09/2016  . Gait instability 07/09/2016  . Itching 04/16/2016  . Unstable angina ([place]) 09/06/2015  . Cardiomyopathy, ischemic   . Chronic systolic heart failure ([place]) 08/21/2015  . Bilateral leg edema 07/19/2015  . Atrial fibrillation ([place]) 07/19/2015  . Urinary urgency 03/30/2015  . Other musculoskeletal symptoms referable to limbs(729.89) 12/17/2012  . Lacunar infarction ([place]) 12/17/2012  . Small vessel disease, cerebrovascular 12/17/2012  . CAROTID BRUIT, RIGHT 08/10/2008  . Peripheral vascular disease ([place]) 06/04/2007  . Diverticulosis of large intestine 06/04/2007  . Hypothyroidism 02/24/2007  . Hyperlipidemia 02/24/2007  . Essential hypertension 02/24/2007  . Acute thromboembolism of deep veins of lower extremity ([place]) 01/21/2007  . Coronary atherosclerosis 10/29/2006    Current Outpatient Medications on File Prior to Visit  Medication Sig Dispense Refill  . amLODipine (NORVASC) 2.5 MG tablet Take 1 tablet (2.5 mg total) by mouth daily. 90 tablet 3  . aspirin EC 81 MG tablet Take 1 tablet (81 mg total) by mouth daily.  90 tablet 3  . benazepril (LOTENSIN) 20 MG tablet Take 1.5 tablets (30 mg total) by mouth daily. 45 tablet 0  . dorzolamide-timolol (COSOPT) 22.3-6.8 MG/ML ophthalmic solution 1 drop 2 (two) times daily.    Marland Kitchen ELIQUIS 5 MG TABS tablet TAKE 1 TABLET TWICE DAILY 180 tablet 3  . furosemide (LASIX) 40 MG tablet Take one tablet by mouth daily as needed for swelling/weight gain 45 tablet 1  . levothyroxine (SYNTHROID, LEVOTHROID) 100 MCG tablet Take 100 mcg by mouth daily  before breakfast.    . metoprolol succinate (TOPROL-XL) 50 MG 24 hr tablet TAKE 1 TABLET BY MOUTH TWICE DAILY WITH OR IMMEDIATELY FOLLOWING A MEAL 180 tablet 3  . nitroGLYCERIN (NITROSTAT) 0.4 MG SL tablet Place 1 tablet (0.4 mg total) under the tongue every 5 (five) minutes as needed for chest pain. 25 tablet 1  . spironolactone (ALDACTONE) 25 MG tablet Take 1 tablet (25 mg total) by mouth daily. 30 tablet 0  . travoprost, benzalkonium, (TRAVATAN) 0.004 % ophthalmic solution Place 1 drop into both eyes at bedtime.     No current facility-administered medications on file prior to visit.     Past Medical History:  Diagnosis Date  . CAD (coronary artery disease)    a. anterior MI s/p PCI in 1992. b. cath 08/2015 with severe three-vessel CAD turned down for CABG and underwent DESx5 to prox Cx/OM2/D1/oRCA/mRCA  . Carotid artery disease ([place])    a. carotid duplex 03/2015 showed 1-39% BICA, normal subclavian arteries, chronically occluded left vertebral, f/u recommended only PRN.  . DVT (deep venous thrombosis) ([place])    X1  . History of nuclear stress test    a. Myoview 6/17: EF 20-25%, mid anteroseptal, apical anterior, apical septal, apical inferior, apical lateral and apical scar, no ischemia, intermediate risk  . HTN (hypertension)   . Hyperlipidemia   . Hypokalemia   . Hypothyroidism   . Ischemic cardiomyopathy    a. Echo 6/17: EF 20-25%, apex appears akinetic, MAC, moderate MR, moderate LAE, mild RVE, trivial PI, PASP 47 mmHg (needs repeat with Definity contrast).  b. Limited echo with Definity contrast 7/17: EF 25-30%, moderate to severe LAE. c. Limited Echo 2018 showed EF 40-45%.  . Longstanding persistent atrial fibrillation ([place])   . MI (myocardial infarction) ([place]) 1992  . PAD (peripheral artery disease) (H[facility])    Right SFA occlusion, severe disease left CFA and SFA  . Stroke [facility])    a. 02/2017 in setting of noncompliance with Eliquis  . TIA (transient ischemic attack)   .  Tricuspid regurgitation     Past Surgical History:  Procedure Laterality Date  . APPENDECTOMY     at hysterectomy and USO for fibroids, Dr. Ysidro Evert  . CARDIAC CATHETERIZATION  1992   Dr Eustace Quail  . CARDIAC CATHETERIZATION N/A 09/06/2015   Procedure: Right/Left Heart Cath and Coronary Angiography;  Surgeon: Burnell Blanks, MD;  Location: [place] [facility];  Service: Cardiovascular;  Laterality: N/A;  . CARDIAC CATHETERIZATION N/A 09/07/2015   Procedure: Coronary Stent Intervention;  Surgeon: Burnell Blanks, MD;  Location: [place] [facility];  Service: Cardiovascular;  Laterality: N/A;  . COLONOSCOPY     negative; 2008, Dr. Delfin Edis  . fracture LLE     '94; pinned  . IR ANGIO INTRA EXTRACRAN SEL COM CAROTID INNOMINATE BILAT MOD SED  03/02/2017  . IR ANGIO VERTEBRAL SEL VERTEBRAL UNI R MOD SED  03/02/2017  . IR RADIOLOGIST EVAL & MGMT  04/28/2017  .  RADIOLOGY WITH ANESTHESIA N/A 03/02/2017   Procedure: RADIOLOGY WITH ANESTHESIA;  Surgeon: Luanne Bras, MD;  Location: [place];  Service: Radiology;  Laterality: N/A;  . TONSILLECTOMY    . TOTAL ABDOMINAL HYSTERECTOMY     & BSO for fibroids    Social History   Socioeconomic History  . Marital status: Married    Spouse name: [name]  . Number of children: 1  . Years of education: college  . Highest education level: Not on file  Occupational History  . Occupation: Pharmacist, hospital    Comment: Retired  Scientific laboratory technician  . Financial resource strain: Not on file  . Food insecurity:    Worry: Not on file    Inability: Not on file  . Transportation needs:    Medical: Not on file    Non-medical: Not on file  Tobacco Use  . Smoking status: Former Smoker    Last attempt to quit: 03/11/1990    Years since quitting: 27.4  . Smokeless tobacco: Never Used  . Tobacco comment: smoked [place], up to < 5 cigarettes  Substance and Sexual Activity  . Alcohol use: No  . Drug use: No  . Sexual activity: Not on file  Lifestyle  .  Physical activity:    Days per week: Not on file    Minutes per session: Not on file  . Stress: Not on file  Relationships  . Social connections:    Talks on phone: Not on file    Gets together: Not on file    Attends religious service: Not on file    Active member of club or organization: Not on file    Attends meetings of clubs or organizations: Not on file    Relationship status: Not on file  Other Topics Concern  . 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 08/31/09- designated party form signed appointing, husband Jalynn Waddell; ok to leave msg on home phone [phone].      Caffeine Small Amount one cup very rare.   Right handed.   One child- [name]    Family History  Problem Relation Age of Onset  . Diabetes Mother   . Hypertension Mother   . Stroke Brother        ?> 55  . Coronary artery disease Brother        stent in 70s  . Cancer Neg Hx     Review of Systems  Constitutional: Negative for appetite change, chills, fatigue and fever.  Respiratory: Positive for shortness of breath (with exertion at times). Negative for cough and wheezing.   Cardiovascular: Positive for leg swelling. Negative for chest pain and palpitations.  Neurological: Negative for light-headedness and headaches.       Objective:   Vitals:   08/06/17 1537  BP: 128/72  Pulse: 79  Resp: 16  Temp: 97.9 F (36.6 C)  SpO2: 94%   BP Readings from Last 3 Encounters:  08/06/17 128/72  07/15/17 122/66  06/04/17 (!) 144/72   Wt Readings from Last 3 Encounters:  08/06/17 215 lb (97.5 kg)  07/15/17 214 lb 12.8 oz (97.4 kg)  06/04/17 221 lb 6.4 oz (100.4 kg)   Body mass index is 34.7 kg/m.   Physical Exam    Constitutional: Appears well-developed and well-nourished. No distress.  HENT:  Head: Normocephalic and atraumatic.  Neck: Neck supple. No tracheal deviation present. No thyromegaly present.  No cervical  lymphadenopathy  Cardiovascular: Normal rate, regular rhythm and normal heart sounds.   No murmur heard. No carotid bruit .  1+ bilateral pitting lower extremity edema Pulmonary/Chest: Effort normal and breath sounds normal. No respiratory distress. No has no wheezes. No rales.  Skin: Skin is warm and dry. Not diaphoretic.  Psychiatric: Normal mood and affect. Behavior is normal.      Assessment & Plan:    See Problem List for Assessment and Plan of chronic medical problems.

## 2017-08-05 NOTE — Patient Instructions (Addendum)

## 2017-08-06 ENCOUNTER — Ambulatory Visit (INDEPENDENT_AMBULATORY_CARE_PROVIDER_SITE_OTHER): Payer: Medicare HMO | Admitting: Internal Medicine

## 2017-08-06 ENCOUNTER — Encounter: Payer: Self-pay | Admitting: Internal Medicine

## 2017-08-06 VITALS — BP 128/72 | HR 79 | Temp 97.9°F | Resp 16 | Wt 215.0 lb

## 2017-08-06 DIAGNOSIS — N183 Chronic kidney disease, stage 3 unspecified: Secondary | ICD-10-CM

## 2017-08-06 DIAGNOSIS — E785 Hyperlipidemia, unspecified: Secondary | ICD-10-CM | POA: Diagnosis not present

## 2017-08-06 DIAGNOSIS — R7303 Prediabetes: Secondary | ICD-10-CM

## 2017-08-06 DIAGNOSIS — R600 Localized edema: Secondary | ICD-10-CM

## 2017-08-06 DIAGNOSIS — I10 Essential (primary) hypertension: Secondary | ICD-10-CM | POA: Diagnosis not present

## 2017-08-06 DIAGNOSIS — E038 Other specified hypothyroidism: Secondary | ICD-10-CM | POA: Diagnosis not present

## 2017-08-06 MED ORDER — SPIRONOLACTONE 25 MG PO TABS: 25.0000 mg | ORAL_TABLET | Freq: Every day | ORAL | 1 refills | Status: DC

## 2017-08-06 MED ORDER — BENAZEPRIL HCL 20 MG PO TABS: 30.0000 mg | ORAL_TABLET | Freq: Every day | ORAL | 1 refills | Status: DC

## 2017-08-06 MED ORDER — ATORVASTATIN CALCIUM 40 MG PO TABS: 80.0000 mg | ORAL_TABLET | Freq: Every day | ORAL | 1 refills | Status: DC

## 2017-08-06 NOTE — Assessment & Plan Note (Addendum)
Not taking her water pill daily Cmp, bnp

## 2017-08-06 NOTE — Assessment & Plan Note (Signed)
Check tsh  Titrate med dose if needed  

## 2017-08-06 NOTE — Assessment & Plan Note (Signed)
Encouraged her to take her water pill more regularly-she may not necessarily need it every day, but may need it every other day Stressed the importance of weighing herself on a regular basis to help determine how much she needs to take the water pill

## 2017-08-06 NOTE — Assessment & Plan Note (Signed)
Check lipid panel  Continue daily statin Regular exercise and healthy diet encouraged  

## 2017-08-06 NOTE — Assessment & Plan Note (Signed)
Check a1c Low sugar / carb diet Stressed regular exercise   

## 2017-08-06 NOTE — Assessment & Plan Note (Signed)
BP well controlled Current regimen effective and well tolerated Continue current medications at current doses cmp  

## 2017-08-11 NOTE — Telephone Encounter (Signed)
This encounter was created in error - please disregard.

## 2017-08-12 ENCOUNTER — Telehealth: Payer: Self-pay | Admitting: Emergency Medicine

## 2017-08-12 NOTE — Telephone Encounter (Signed)
PA completed for Atorvastatin, approved. Pharmacy notified.

## 2017-08-26 ENCOUNTER — Telehealth: Payer: Self-pay | Admitting: Internal Medicine

## 2017-08-26 NOTE — Telephone Encounter (Signed)
No referral needed for therapy - can try a couple of places:  [facility] [phone]  [facility] [address] [place] [facility] [phone]  [facility]  [address] [place], [place] [phone]  [facility], [facility] Office [address] [place], [place], [address] [phone] Office [phone] [facility]            [address] [place], [place] [phone]

## 2017-08-26 NOTE — Telephone Encounter (Signed)
Copied from CRM [id number]. Topic: Quick Communication - See Telephone Encounter >> Aug 26, 2017  2:17 PM Cipriano Bunker wrote: CRM for notification. See Telephone encounter for: 08/26/17.  Pt. Son, [name] called and asking if dr. Lawerance Bach could refer her to someone to speak to about her depression.  She is not wanting to take her medicine now.  As per conversation with Dr. Lawerance Bach at last visit

## 2017-08-27 NOTE — Telephone Encounter (Signed)
Spoke with pts son to give names and number of offices listed.

## 2017-09-04 ENCOUNTER — Encounter: Payer: Self-pay | Admitting: Adult Health

## 2017-09-04 ENCOUNTER — Ambulatory Visit: Payer: Medicare HMO | Admitting: Adult Health

## 2017-09-04 VITALS — BP 128/66 | HR 56 | Ht 66.0 in | Wt 217.0 lb

## 2017-09-04 DIAGNOSIS — E785 Hyperlipidemia, unspecified: Secondary | ICD-10-CM

## 2017-09-04 DIAGNOSIS — I10 Essential (primary) hypertension: Secondary | ICD-10-CM

## 2017-09-04 DIAGNOSIS — I720 Aneurysm of carotid artery: Secondary | ICD-10-CM | POA: Diagnosis not present

## 2017-09-04 DIAGNOSIS — I480 Paroxysmal atrial fibrillation: Secondary | ICD-10-CM

## 2017-09-04 DIAGNOSIS — I671 Cerebral aneurysm, nonruptured: Secondary | ICD-10-CM

## 2017-09-04 DIAGNOSIS — I63412 Cerebral infarction due to embolism of left middle cerebral artery: Secondary | ICD-10-CM | POA: Diagnosis not present

## 2017-09-04 NOTE — Progress Notes (Signed)
[facility] [address] [place]. [place] [address] [phone]       OFFICE FOLLOW-UP NOTE  Yvonne. Yvonne Bailey Date of Birth:  08-22-1940 Medical Record Number:  [id number]   HPI: Yvonne Bailey is a 5 year pleasant African-American lady seen today for stroke in December 2018. History is obtained from patient and review of electronic medical records. I have personally reviewed imaging films. Yvonne R Lindseyis a 77 y.o.femalewith history of DVT on Eliquis, coronary artery disease with previous MI, carotid artery disease, hypertension, lipidemia, ischemic cardiomyopathy and peripheral artery diseasepresenting with aphasia and possible confusion.Shedidnot receive IV t-PA due being to anticoagulation with eliquis but it was unclear whether she was taking it correctly. She was probably taking it only once a day and not twice a day as scheduled. CT scan of the head on admission showed no acute abnormality but showed small remote age infarcts. MRI scan of the brain showed acute nonhemorrhagic left parietal MCA branch infarct and old right basal ganglia as well as small remote age bifrontal infarcts. MRA of the brain was not done. Cerebral catheter angiogram was performed which showed slow recanalizing anterior parietal branch of the inferior division of the left MCA M2/M3 junction which was not felt to be amenable for endovascular treatment. 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 transthoracic echo showed normal ejection fraction. LDL cholesterol was 94 mg percent. Hemoglobin A1c was 5.4. Patient had mild expressive aphasia and eliquis was resumed and patient counseled to take the correct dose. Patient was felt to be a candidate for inpatient rehabilitation and was transferred there when bed was available.   06/04/2017 visit PS: Patient states she's done well since discharge. She is finished outpatient therapies. She still gets  occasionally her words mixed and has trouble completing sentences and uses paraphasic errors particularly when she is under stress or anxious or tries to talk fast. She is otherwise quite independent in activities of daily living. She is having some increasing difficulty with incontinence and has started wearing adult briefs at night. Her blood pressure is well controlled and today it is 140/76. She is tolerating Lipitor well without muscle aches or pains. She and her son have a lot of questions about her asymptomatic cerebral aneurysm and I discussed risk benefits of treatment and natural history of rupture with them  09/04/2017 update: Patient returns today for 1-monthfollow-up.  Overall she continues to do well without residual stroke deficits and he has returned to all previous activities besides driving.  Patient continues to take both Eliquis and aspirin without bleeding or bruising and is compliant.  Continues to take Lipitor without side effects myalgias.  Blood pressure today satisfactory 120/66.  She does deny headaches or new onset visual problems.  She does say at times her vision will get blurry such as some things floating but will be seeing ophthalmologist in approximately 1 month and does have a history of cataracts.  Patient questioning whether she can start driving at this time.  Unsure if patient has a cognitive ability to be able to be driving and would not be safe with visual concerns.  Denies new or worsening stroke/TIA symptoms.   ROS:   14 system review of systems is positive for no complaints and all other systems negative  PMH:  Past Medical History:  Diagnosis Date  . CAD (coronary artery disease)    a. anterior MI s/p PCI in 1992. b. cath 08/2015 with  severe three-vessel CAD turned down for CABG and underwent DESx5 to prox Cx/OM2/D1/oRCA/mRCA  . Carotid artery disease ([place])    a. carotid duplex 03/2015 showed 1-39% BICA, normal subclavian arteries, chronically occluded left  vertebral, f/u recommended only PRN.  . DVT (deep venous thrombosis) ([place])    X1  . History of nuclear stress test    a. Myoview 6/17: EF 20-25%, mid anteroseptal, apical anterior, apical septal, apical inferior, apical lateral and apical scar, no ischemia, intermediate risk  . HTN (hypertension)   . Hyperlipidemia   . Hypokalemia   . Hypothyroidism   . Ischemic cardiomyopathy    a. Echo 6/17: EF 20-25%, apex appears akinetic, MAC, moderate MR, moderate LAE, mild RVE, trivial PI, PASP 47 mmHg (needs repeat with Definity contrast).  b. Limited echo with Definity contrast 7/17: EF 25-30%, moderate to severe LAE. c. Limited Echo 2018 showed EF 40-45%.  . Longstanding persistent atrial fibrillation ([place])   . MI (myocardial infarction) ([place]) 1992  . PAD (peripheral artery disease) ([facility])    Right SFA occlusion, severe disease left CFA and SFA  . Stroke [facility])    a. 02/2017 in setting of noncompliance with Eliquis  . TIA (transient ischemic attack)   . Tricuspid regurgitation     Social History:  Social History   Socioeconomic History  . Marital status: Married    Spouse name: [name]  . Number of children: 1  . Years of education: college  . Highest education level: Not on file  Occupational History  . Occupation: Pharmacist, hospital    Comment: Retired  Scientific laboratory technician  . Financial resource strain: Not on file  . Food insecurity:    Worry: Not on file    Inability: Not on file  . Transportation needs:    Medical: Not on file    Non-medical: Not on file  Tobacco Use  . Smoking status: Former Smoker    Last attempt to quit: 03/11/1990    Years since quitting: 27.5  . Smokeless tobacco: Never Used  . Tobacco comment: smoked [place], up to < 5 cigarettes  Substance and Sexual Activity  . Alcohol use: No  . Drug use: No  . Sexual activity: Not on file  Lifestyle  . Physical activity:    Days per week: Not on file    Minutes per session: Not on file  . Stress: Not on file  Relationships    . Social connections:    Talks on phone: Not on file    Gets together: Not on file    Attends religious service: Not on file    Active member of club or organization: Not on file    Attends meetings of clubs or organizations: Not on file    Relationship status: Not on file  . Intimate partner violence:    Fear of current or ex partner: Not on file    Emotionally abused: Not on file    Physically abused: Not on file    Forced sexual activity: Not on file  Other Topics Concern  . 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 08/31/09- designated party form signed appointing, husband Alynn Ellithorpe; ok to leave msg on home phone [phone].      Caffeine Small Amount one cup very rare.   Right handed.   One child- [name]    Medications:   Current Outpatient Medications on File Prior to Visit  Medication  Sig Dispense Refill  . amLODipine (NORVASC) 2.5 MG tablet Take 1 tablet (2.5 mg total) by mouth daily. 90 tablet 3  . aspirin EC 81 MG tablet Take 1 tablet (81 mg total) by mouth daily. 90 tablet 3  . atorvastatin (LIPITOR) 40 MG tablet Take 2 tablets (80 mg total) by mouth daily. 180 tablet 1  . benazepril (LOTENSIN) 20 MG tablet Take 1.5 tablets (30 mg total) by mouth daily. 135 tablet 1  . dorzolamide-timolol (COSOPT) 22.3-6.8 MG/ML ophthalmic solution 1 drop 2 (two) times daily.    Marland Kitchen ELIQUIS 5 MG TABS tablet TAKE 1 TABLET TWICE DAILY 180 tablet 3  . furosemide (LASIX) 40 MG tablet Take one tablet by mouth daily as needed for swelling/weight gain 45 tablet 1  . levothyroxine (SYNTHROID, LEVOTHROID) 100 MCG tablet Take 100 mcg by mouth daily before breakfast.    . metoprolol succinate (TOPROL-XL) 50 MG 24 hr tablet TAKE 1 TABLET BY MOUTH TWICE DAILY WITH OR IMMEDIATELY FOLLOWING A MEAL 180 tablet 3  . nitroGLYCERIN (NITROSTAT) 0.4 MG SL tablet Place 1 tablet (0.4 mg total) under the tongue every 5 (five) minutes  as needed for chest pain. 25 tablet 1  . spironolactone (ALDACTONE) 25 MG tablet Take 1 tablet (25 mg total) by mouth daily. 90 tablet 1  . travoprost, benzalkonium, (TRAVATAN) 0.004 % ophthalmic solution Place 1 drop into both eyes at bedtime.     No current facility-administered medications on file prior to visit.     Allergies:   Allergies  Allergen Reactions  . Definity [Perflutren Lipid Microsphere] Other (See Comments)    Patient experienced back pain with injection  . Cilostazol Other (See Comments)     Pletal :" head felt funny"    Physical Exam General: pleasant elderly African-American lady, seated, in no evident distress Head: head normocephalic and atraumatic.  Neck: supple with no carotid or supraclavicular bruits Cardiovascular: regular rate and rhythm, no murmurs Musculoskeletal: no deformity Skin:  no rash/petichiae Vascular:  Normal pulses all extremities Vitals:   09/04/17 1448  BP: 128/66  Pulse: (!) 56   Neurologic Exam Mental Status: Awake and fully alert. Oriented to place and time. Remote memory intact.  Questionable intact recent memory as patient was having a hard time following level conversation during appointment and frequently re-asking questions that were just discussed.  Patient was alone at today's appointment. Mood and affect appropriate. And occasional paraphasic errors and slight disfluency in speech.  Cranial Nerves: Fundoscopic exam reveals sharp disc margins. Pupils equal, briskly reactive to light. Extraocular movements full without nystagmus. Visual fields full to confrontation. Hearing intact. Facial sensation intact. Face, tongue, palate moves normally and symmetrically.  Motor: Normal bulk and tone. Normal strength in all tested extremity muscles. Sensory.: intact to touch ,pinprick .position and vibratory sensation.  Coordination: Rapid alternating movements normal in all extremities. Finger-to-nose and heel-to-shin performed accurately  bilaterally. Gait and Station: Arises from chair without difficulty. Stance is normal. Gait demonstrates normal stride length and balance . Able to heel, toe and tandem walk with slight difficulty.  Reflexes: 1+ and symmetric. Toes downgoing.     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 Asymptomatic 10 x 5.7 mm left cavernous carotid artery aneurysm and fibrillation, hypertension, hyperlipidemia.  Patient returns today for follow-up visit and overall is doing well.    PLAN: -Continue aspirin 81 mg daily and Eliquis (apixaban) daily  and Lipitor for secondary stroke prevention -  Order placed for follow-up MRI/MRA of head per Dr. Anette Guarneri recommendation for follow-up monitoring on aneurysm -F/u with PCP regarding routine monitoring -f/u with cardiologist regarding atrial fibrillation, hyperlipidemia and hypertension along with Eliquis management -continue to monitor BP at home -Maintain strict control of hypertension with blood pressure goal below 130/90, diabetes with hemoglobin A1c goal below 6.5% and cholesterol with LDL cholesterol (bad cholesterol) goal below 70 mg/dL. I also advised the patient to eat a healthy diet with plenty of whole grains, cereals, fruits and vegetables, exercise regularly and maintain ideal body weight.  Follow up in 6 months or call earlier if needed   Greater than 50% of time during this 25 minute visit was spent on counseling,explanation of diagnosis of embolic stroke, atrial fibrillation, asymptomatic cerebral aneurysm, planning of further management, discussion with patient and family and coordination of care  Venancio Poisson, AGNP-BC  [facility] [address] [place], [place] [address]  Phone [phone] Fax [phone] Note: This document was prepared with digital dictation and possible smart phrase technology. Any  transcriptional errors that result from this process are unintentional.

## 2017-09-04 NOTE — Progress Notes (Signed)
I agree with the above plan 

## 2017-09-04 NOTE — Patient Instructions (Addendum)
Continue aspirin 81 mg daily and Eliquis (apixaban) daily  and lipitor  for secondary stroke prevention  You will be called to set up appointment for MRI/MRA to monitor aneurysm   Continue to follow up with PCP regarding general monitoring  Continue to follow up with cardiologist regarding cholesterol, blood pressure and atrial fibrillation management   Continue to monitor blood pressure at home  Maintain strict control of hypertension with blood pressure goal below 130/90, diabetes with hemoglobin A1c goal below 6.5% and cholesterol with LDL cholesterol (bad cholesterol) goal below 70 mg/dL. I also advised the patient to eat a healthy diet with plenty of whole grains, cereals, fruits and vegetables, exercise regularly and maintain ideal body weight.  Followup in the future with me in 6 months or call earlier if needed       Thank you for coming to see [place] at [facility]. I hope we have been able to provide you high quality care today.  You may receive a patient satisfaction survey over the next few weeks. We would appreciate your feedback and comments so that we may continue to improve ourselves and the health of our patients.

## 2017-09-08 ENCOUNTER — Telehealth: Payer: Self-pay | Admitting: Adult Health

## 2017-09-08 NOTE — Telephone Encounter (Signed)
Francine Graven Berkley Harvey: [id number] (exp. 09/08/17 to 10/08/17) order sent to GI. They will reach out to the pt to schedule.

## 2017-09-24 ENCOUNTER — Telehealth: Payer: Self-pay | Admitting: Emergency Medicine

## 2017-09-24 NOTE — Telephone Encounter (Addendum)
Forms have been completed & placed in providers box to review and sign.  °

## 2017-09-24 NOTE — Telephone Encounter (Signed)
Copied from CRM [id number]. Topic: Inquiry >> Sep 24, 2017  2:12 PM Yvonna Alanis wrote: Reason for CRM: Patient's son called stating that he will come by tomorrow to pick up a copy of the patient's [facility] document, after it has been faxed to [facility].         Thank You!!!

## 2017-09-24 NOTE — Telephone Encounter (Signed)
Forms were received and placed on Yvonne Bailey's desk for completion.

## 2017-09-25 NOTE — Telephone Encounter (Signed)
Forms have been signed, faxed to [facility] [phone], Copy sent to scan.  Original is up front for patient's son to pick up.

## 2017-10-03 ENCOUNTER — Telehealth: Payer: Self-pay | Admitting: Emergency Medicine

## 2017-10-03 NOTE — Telephone Encounter (Signed)
This has been added to forms & faxed to [facility] @ 336, [phone], Copy sent to scan.  Son has been informed & requested copy be mailed to him.

## 2017-10-03 NOTE — Telephone Encounter (Signed)
Can you check on this please?

## 2017-10-03 NOTE — Telephone Encounter (Signed)
Copied from CRM [id number]. Topic: Inquiry >> Sep 24, 2017  2:12 PM Yvonna Alanis wrote: Reason for CRM: Patient's son called stating that he will come by tomorrow to pick up a copy of the patient's SCAT document, after it has been faxed to [facility].         Thank You!!!  >> Oct 02, 2017  5:09 PM Floria Raveling A wrote: Son called in and stated that the 2nd page was not completed and need the diagnosis on there.  Per son scat faxed this back to office ?    [phone] pt number  Fax number for scat [phone]

## 2017-10-13 DIAGNOSIS — H401133 Primary open-angle glaucoma, bilateral, severe stage: Secondary | ICD-10-CM | POA: Diagnosis not present

## 2017-10-13 DIAGNOSIS — I639 Cerebral infarction, unspecified: Secondary | ICD-10-CM | POA: Diagnosis not present

## 2017-10-13 DIAGNOSIS — H353131 Nonexudative age-related macular degeneration, bilateral, early dry stage: Secondary | ICD-10-CM | POA: Diagnosis not present

## 2017-10-13 DIAGNOSIS — H40052 Ocular hypertension, left eye: Secondary | ICD-10-CM | POA: Diagnosis not present

## 2017-10-14 ENCOUNTER — Other Ambulatory Visit: Payer: Self-pay | Admitting: Internal Medicine

## 2017-10-27 ENCOUNTER — Telehealth (HOSPITAL_COMMUNITY): Payer: Self-pay

## 2017-10-27 NOTE — Telephone Encounter (Signed)
Called to schedule f/u, no answer, no vm. AW 

## 2017-11-06 NOTE — Telephone Encounter (Signed)
Redid the [place]: [id number] (exp. 11/06/17 to 12/06/17) patient is scheduled at GI for 11/13/17.

## 2017-11-13 ENCOUNTER — Ambulatory Visit
Admission: RE | Admit: 2017-11-13 | Discharge: 2017-11-13 | Disposition: A | Discharge status: Home / Self Care | Payer: Medicare HMO | Source: Ambulatory Visit | Attending: Adult Health | Admitting: Adult Health

## 2017-11-13 DIAGNOSIS — I720 Aneurysm of carotid artery: Secondary | ICD-10-CM

## 2017-11-13 DIAGNOSIS — I671 Cerebral aneurysm, nonruptured: Secondary | ICD-10-CM

## 2017-11-13 MED ORDER — GADOBENATE DIMEGLUMINE 529 MG/ML IV SOLN
20.0000 mL | Freq: Once | INTRAVENOUS | Status: AC | PRN
  Administered 2017-11-13: 10 mL via INTRAVENOUS

## 2017-11-17 ENCOUNTER — Telehealth (HOSPITAL_COMMUNITY): Payer: Self-pay

## 2017-11-17 ENCOUNTER — Other Ambulatory Visit: Payer: Self-pay | Admitting: Internal Medicine

## 2017-11-17 NOTE — Telephone Encounter (Signed)
Called to schedule consult discuss recent mri, no answer, no vm. AW

## 2017-11-18 ENCOUNTER — Telehealth: Payer: Self-pay

## 2017-11-18 NOTE — Telephone Encounter (Signed)
Notes recorded by Hildred Alamin, RN on 11/18/2017 at 5:24 PM EDT RN call son [name] on dpr about the MRA head wo contrast. It did how new narrowing of the vertebral artery along with mild basilar artery which are new compared to prior imaging. A note will be sent to Dr. Corliss Skains to follow up with him. It did not show any aneurysms. PTs son verbalized understanding. ------

## 2017-11-18 NOTE — Telephone Encounter (Signed)
Notes recorded by Hildred Alamin, RN on 11/18/2017 at 4:26 PM EDT Rn tried to call son, vm was not set up to leave message. ------

## 2017-11-18 NOTE — Telephone Encounter (Signed)
-----   Message from George Hugh, NP sent at 11/17/2017  7:43 AM EDT ----- Please notify patient that her MR angio did show new narrowing of her vertebral artery along with mid basilar artery which are both new compared to prior imaging. I will send note to Dr. Corliss Skains for her to follow up with him. This imaging did not show any aneurysms.

## 2017-11-18 NOTE — Telephone Encounter (Signed)
Pts son(john) returning RN call

## 2017-11-26 ENCOUNTER — Other Ambulatory Visit: Payer: Self-pay | Admitting: Cardiovascular Disease

## 2017-11-27 NOTE — Telephone Encounter (Signed)
Just wanted to clarify. Should this order be changed to qd prn? Pharmacy is requesting qd, current med list indicates that patient takes qd prn. Okay to change? Please advise. Thanks, MI

## 2017-12-01 ENCOUNTER — Encounter: Payer: Self-pay | Admitting: Cardiovascular Disease

## 2017-12-01 ENCOUNTER — Ambulatory Visit (INDEPENDENT_AMBULATORY_CARE_PROVIDER_SITE_OTHER): Payer: Medicare HMO | Admitting: Cardiovascular Disease

## 2017-12-01 VITALS — BP 132/64 | HR 55 | Ht 66.0 in | Wt 218.1 lb

## 2017-12-01 DIAGNOSIS — I255 Ischemic cardiomyopathy: Secondary | ICD-10-CM

## 2017-12-01 DIAGNOSIS — I10 Essential (primary) hypertension: Secondary | ICD-10-CM | POA: Diagnosis not present

## 2017-12-01 DIAGNOSIS — I481 Persistent atrial fibrillation: Secondary | ICD-10-CM

## 2017-12-01 DIAGNOSIS — I5022 Chronic systolic (congestive) heart failure: Secondary | ICD-10-CM | POA: Diagnosis not present

## 2017-12-01 DIAGNOSIS — E785 Hyperlipidemia, unspecified: Secondary | ICD-10-CM

## 2017-12-01 DIAGNOSIS — I2510 Atherosclerotic heart disease of native coronary artery without angina pectoris: Secondary | ICD-10-CM | POA: Diagnosis not present

## 2017-12-01 DIAGNOSIS — I4819 Other persistent atrial fibrillation: Secondary | ICD-10-CM

## 2017-12-01 MED ORDER — FUROSEMIDE 40 MG PO TABS: ORAL_TABLET | ORAL | 6 refills | Status: DC

## 2017-12-01 NOTE — Telephone Encounter (Signed)
Addressed at office visit today

## 2017-12-01 NOTE — Patient Instructions (Signed)
Medication Instructions:  Your physician recommends that you continue on your current medications as directed. Please refer to the Current Medication list given to you today. Take lasix daily for the next 3-4 days.    Labwork: none  Testing/Procedures: none  Follow-Up: Your physician wants you to follow-up in: 6 months with B. Sharol Harness, [place] You will receive a reminder letter in the mail two months in advance. If you don't receive a letter, please call our office to schedule the follow-up appointment.   Any Other Special Instructions Will Be Listed Below (If Applicable).     If you need a refill on your cardiac medications before your next appointment, please call your pharmacy.

## 2017-12-01 NOTE — Progress Notes (Signed)
Chief Complaint  Patient presents with  . Follow-up    CAD   History of Present Illness: 77 yo female with history of CAD, HTN, PAD, carotid artery disease, hyperlipidemia and paroxysmal atrial fibrillation here today for cardiac followup. In 1992 she had an anterior MI treated with PCI. Her ejection fraction in 2007 was 45%. She also has mild carotid disease by carotid dopplers 2017. She has LE arterial disease and is known to have occlusion of the right SFA and severe stenosis left CFA and SFA. ABI January 2017 with 0.65 on right and 0.72 on the left. She is on Eliquis for PAF. Echo June 2017 with fall in LVEF. Cath June 2017 with severe three vessel CAD. She was turned down for CABG. I performed PCI with placement of drug eluting stents in the proximal and mid RCA, proximal Circumflex, OM2, Diagonal 1 in June 2017. Echo 02/08/16 with LVEF=40-45%, moderate TR, trivial MR. She had a CVA in December 2018 felt to be secondary to non-compliance with Eliquis. She had resulting expressive aphasia.   She is here today for follow up. The patient denies any chest pain, dyspnea, palpitations, orthopnea, PND, dizziness, near syncope or syncope. She has been feeling well. She has not taken her Lasix over the past few weeks. Mild LE edema.   Primary Care Physician: Binnie Rail, MD  Past Medical History:  Diagnosis Date  . CAD (coronary artery disease)    a. anterior MI s/p PCI in 1992. b. cath 08/2015 with severe three-vessel CAD turned down for CABG and underwent DESx5 to prox Cx/OM2/D1/oRCA/mRCA  . Carotid artery disease ([place])    a. carotid duplex 03/2015 showed 1-39% BICA, normal subclavian arteries, chronically occluded left vertebral, f/u recommended only PRN.  . DVT (deep venous thrombosis) ([place])    X1  . History of nuclear stress test    a. Myoview 6/17: EF 20-25%, mid anteroseptal, apical anterior, apical septal, apical inferior, apical lateral and apical scar, no ischemia, intermediate risk    . HTN (hypertension)   . Hyperlipidemia   . Hypokalemia   . Hypothyroidism   . Ischemic cardiomyopathy    a. Echo 6/17: EF 20-25%, apex appears akinetic, MAC, moderate MR, moderate LAE, mild RVE, trivial PI, PASP 47 mmHg (needs repeat with Definity contrast).  b. Limited echo with Definity contrast 7/17: EF 25-30%, moderate to severe LAE. c. Limited Echo 2018 showed EF 40-45%.  . Longstanding persistent atrial fibrillation ([place])   . MI (myocardial infarction) ([place]) 1992  . PAD (peripheral artery disease) ([facility])    Right SFA occlusion, severe disease left CFA and SFA  . Stroke [facility])    a. 02/2017 in setting of noncompliance with Eliquis  . TIA (transient ischemic attack)   . Tricuspid regurgitation     Past Surgical History:  Procedure Laterality Date  . APPENDECTOMY     at hysterectomy and USO for fibroids, Dr. Ysidro Evert  . CARDIAC CATHETERIZATION  1992   Dr Eustace Quail  . CARDIAC CATHETERIZATION N/A 09/06/2015   Procedure: Right/Left Heart Cath and Coronary Angiography;  Surgeon: Burnell Blanks, MD;  Location: [place] [facility];  Service: Cardiovascular;  Laterality: N/A;  . CARDIAC CATHETERIZATION N/A 09/07/2015   Procedure: Coronary Stent Intervention;  Surgeon: Burnell Blanks, MD;  Location: [place] [facility];  Service: Cardiovascular;  Laterality: N/A;  . COLONOSCOPY     negative; 2008, Dr. Delfin Edis  . fracture LLE     '94; pinned  .  IR ANGIO INTRA EXTRACRAN SEL COM CAROTID INNOMINATE BILAT MOD SED  03/02/2017  . IR ANGIO VERTEBRAL SEL VERTEBRAL UNI R MOD SED  03/02/2017  . IR RADIOLOGIST EVAL & MGMT  04/28/2017  . RADIOLOGY WITH ANESTHESIA N/A 03/02/2017   Procedure: RADIOLOGY WITH ANESTHESIA;  Surgeon: Luanne Bras, MD;  Location: [place];  Service: Radiology;  Laterality: N/A;  . TONSILLECTOMY    . TOTAL ABDOMINAL HYSTERECTOMY     & BSO for fibroids    Current Outpatient Medications  Medication Sig Dispense Refill  . amLODipine (NORVASC) 2.5 MG  tablet Take 1 tablet (2.5 mg total) by mouth daily. 90 tablet 3  . aspirin EC 81 MG tablet Take 1 tablet (81 mg total) by mouth daily. 90 tablet 3  . atorvastatin (LIPITOR) 40 MG tablet Take 2 tablets (80 mg total) by mouth daily. 180 tablet 1  . benazepril (LOTENSIN) 20 MG tablet Take 1.5 tablets (30 mg total) by mouth daily. 135 tablet 1  . dorzolamide-timolol (COSOPT) 22.3-6.8 MG/ML ophthalmic solution 1 drop 2 (two) times daily.    Marland Kitchen ELIQUIS 5 MG TABS tablet TAKE 1 TABLET TWICE DAILY 180 tablet 3  . furosemide (LASIX) 40 MG tablet Take one tablet by mouth daily as needed for swelling/weight gain 30 tablet 6  . levothyroxine (SYNTHROID, LEVOTHROID) 112 MCG tablet TAKE 1 TABLET(112 MCG) BY MOUTH DAILY 90 tablet 0  . metoprolol succinate (TOPROL-XL) 50 MG 24 hr tablet TAKE 1 TABLET BY MOUTH TWICE DAILY WITH OR IMMEDIATELY FOLLOWING A MEAL 180 tablet 3  . nitroGLYCERIN (NITROSTAT) 0.4 MG SL tablet Place 1 tablet (0.4 mg total) under the tongue every 5 (five) minutes as needed for chest pain. 25 tablet 1  . spironolactone (ALDACTONE) 25 MG tablet TAKE 1 TABLET(25 MG) BY MOUTH DAILY 90 tablet 0  . travoprost, benzalkonium, (TRAVATAN) 0.004 % ophthalmic solution Place 1 drop into both eyes at bedtime.     No current facility-administered medications for this visit.     Allergies  Allergen Reactions  . Definity [Perflutren Lipid Microsphere] Other (See Comments)    Patient experienced back pain with injection  . Cilostazol Other (See Comments)     Pletal :" head felt funny"    Social History   Socioeconomic History  . Marital status: Married    Spouse name: [name]  . Number of children: 1  . Years of education: college  . Highest education level: Not on file  Occupational History  . Occupation: Pharmacist, hospital    Comment: Retired  Scientific laboratory technician  . Financial resource strain: Not on file  . Food insecurity:    Worry: Not on file    Inability: Not on file  . Transportation needs:    Medical:  Not on file    Non-medical: Not on file  Tobacco Use  . Smoking status: Former Smoker    Last attempt to quit: 03/11/1990    Years since quitting: 27.7  . Smokeless tobacco: Never Used  . Tobacco comment: smoked [place], up to < 5 cigarettes  Substance and Sexual Activity  . Alcohol use: No  . Drug use: No  . Sexual activity: Not on file  Lifestyle  . Physical activity:    Days per week: Not on file    Minutes per session: Not on file  . Stress: Not on file  Relationships  . Social connections:    Talks on phone: Not on file    Gets together: Not on file  Attends religious service: Not on file    Active member of club or organization: Not on file    Attends meetings of clubs or organizations: Not on file    Relationship status: Not on file  . Intimate partner violence:    Fear of current or ex partner: Not on file    Emotionally abused: Not on file    Physically abused: Not on file    Forced sexual activity: Not on file  Other Topics Concern  . 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 08/31/09- designated party form signed appointing, husband Der Gagliano; ok to leave msg on home phone [phone].      Caffeine Small Amount one cup very rare.   Right handed.   One child- [name]    Family History  Problem Relation Age of Onset  . Diabetes Mother   . Hypertension Mother   . Stroke Brother        ?> 55  . Coronary artery disease Brother        stent in 81s  . Cancer Neg Hx     Review of Systems:  As stated in the HPI and otherwise negative.   BP 132/64   Pulse (!) 55   Ht _0  (1.676 m)   Wt 218 lb 1.9 oz (98.9 kg)   SpO2 97%   BMI 35.21 kg/m   Physical Examination:  General: Well developed, well nourished, NAD  HEENT: OP clear, mucus membranes moist  SKIN: warm, dry. No rashes. Neuro: No focal deficits  Musculoskeletal: Muscle strength 5/5 all ext  Psychiatric: Mood and  affect normal  Neck: No JVD, no carotid bruits, no thyromegaly, no lymphadenopathy.  Lungs:Clear bilaterally, no wheezes, rhonci, crackles Cardiovascular: Regular rate and rhythm. No murmurs, gallops or rubs. Abdomen:Soft. Bowel sounds present. Non-tender.  Extremities: No lower extremity edema. Pulses are 2 + in the bilateral DP/PT.  Echo 03/03/17: - Procedure narrative: Transthoracic echocardiography. Image   quality was suboptimal. The study was technically difficult, as a   result of poor sound wave transmission and restricted patient   mobility. Intravenous contrast (agitated saline) was   administered. - Left ventricle: There is a false tendon in the mid LV of no   clinical significance. LVF appears reduced but poor acoustical   windows prevent accurate assessment of EF or wall motion. Tech   reported that patient has allergy to echo contrast. Bubble study   done but poor quality. Consider Cardiac MRI The cavity size was   normal. There was moderate concentric hypertrophy. Systolic   function was normal. Wall motion was normal; there were no   regional wall motion abnormalities. - Aortic valve: Trileaflet; mildly thickened, mildly calcified   leaflets. - Mitral valve: Valve area by continuity equation (using LVOT   flow): 0.86 cm^2. - Left atrium: The atrium was mildly dilated. - Pulmonary arteries: PA peak pressure: 32 mm Hg (S).  EKG:  EKG is not ordered today. The ekg ordered today demonstrates   Recent Labs: 02/04/2017: TSH 4.34 03/17/2017: BUN 13; Creatinine, Ser 0.97; Hemoglobin 12.4; Platelets 284; Potassium 3.4; Sodium 140 06/03/2017: ALT 18   Lipid Panel    Component Value Date/Time   CHOL 131 06/03/2017 0921   TRIG 94 06/03/2017 0921   HDL 40 06/03/2017 0921   CHOLHDL 3.3 06/03/2017 0921   CHOLHDL 3.4 03/03/2017 0551   VLDL 12 03/03/2017 0551   LDLCALC  72 06/03/2017 0921     Wt Readings from Last 3 Encounters:  12/01/17 218 lb 1.9 oz (98.9 kg)  09/04/17  217 lb (98.4 kg)  08/06/17 215 lb (97.5 kg)     Other studies Reviewed: Additional studies/ records that were reviewed today include: . Review of the above records demonstrates:   Assessment and Plan:   1. Chronic systolic CHF: Volume status is ok today. She has mild LE edema. Her weight is stable. She will continue Lasix as needed. I have asked her to take it daily for several days.    2. CAD without angina: She is s/p multivessel stenting in June 2017 after being found to have severe triple vessel CAD and not felt to be a candidate for CABG. Stents were placed in the ostial and mid RCA, proximal Circumflex, OM2 and first Diagonal. She has no chest pain. Will continue ASA, statin and beta blocker.     3. Ischemic CM: LV systolic function improved post PCI, LVEF up to 45%. Continue Toprol, Lotensin, aldactone.     4. Persistent Atrial Fibrillation: She appears to be in atrial fib. Rate is controlled. Continue Toprol and Eliquis.   5. HTN: BP is controlled at home.   6. Hyperlipidemia: LDL at goal. Continue statin.   7. PAD: no LE pain. Continue ASA and statin.   8. History of CVA: Will continue Eliquis.   Current medicines are reviewed at length with the patient today.  The patient does not have concerns regarding medicines.  The following changes have been made:    Labs/ tests ordered today include:   No orders of the defined types were placed in this encounter.   Disposition:   FU with me or care team APP in 6 months  Signed, Lauree Chandler, MD 12/01/2017 3:45 PM    [address], [place], [place]  [address] Phone: [phone]; Fax: [phone]

## 2017-12-02 ENCOUNTER — Other Ambulatory Visit (HOSPITAL_COMMUNITY): Payer: Self-pay | Admitting: Interventional Radiology

## 2017-12-02 DIAGNOSIS — I729 Aneurysm of unspecified site: Secondary | ICD-10-CM

## 2017-12-02 DIAGNOSIS — I771 Stricture of artery: Secondary | ICD-10-CM

## 2017-12-05 ENCOUNTER — Ambulatory Visit (HOSPITAL_COMMUNITY)
Admission: RE | Admit: 2017-12-05 | Discharge: 2017-12-05 | Disposition: A | Discharge status: Home / Self Care | Payer: Medicare HMO | Source: Ambulatory Visit | Attending: Interventional Radiology | Admitting: Interventional Radiology

## 2017-12-05 DIAGNOSIS — I729 Aneurysm of unspecified site: Secondary | ICD-10-CM

## 2017-12-05 DIAGNOSIS — I771 Stricture of artery: Secondary | ICD-10-CM

## 2017-12-05 DIAGNOSIS — I6502 Occlusion and stenosis of left vertebral artery: Secondary | ICD-10-CM | POA: Diagnosis not present

## 2017-12-05 NOTE — Progress Notes (Signed)
Chief Complaint: Patient was seen in consultation today for intracranial aneurysms x3 and left vertebral artery stenosis.  Referring Physician(s): Kerney Elbe  Supervising Physician: Luanne Bras  Patient Status: [facility] - Out-pt  History of Present Illness: Yvonne Bailey is a 77 y.o. female with a past medical history of hypertension, MI 1992, CAD, persistent atrial fibrillation, tricuspid regurgitation, PAD, ischemic cardiomyopathy, hyperlipidemia, DVT, TIA, CVA 02/2017, CKD stage III, hypothyroidism, and hypokalemia. She is known to [facility] and has been followed by Dr. Estanislado Pandy since 02/2017. She presented to [facility] ED 03/02/2018 as a code stroke. She underwent an image-guided diagnostic cerebral angiogram 03/02/2018 with Dr. 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 She consulted with Dr. Estanislado Pandy 04/28/2017 regarding management of these aneurysms. At that time, patient decided to pursue medical management including monitoring with routine imaging scans.  Diagnostic cerebral angiogram 03/02/2017: 1. Angiographically slow flow with delayed opacification of a left middle cerebral artery distal M2 M3 junction anterior parietal branch leading up into the angular gyrus. Angiographically this is suggestive of slow recanalization. 2. 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 3. 5.5 mm x 5.2 mm fusiform eccentric outpouching at the level of the ophthalmic artery involving the left internal carotid artery.  MRI/MRA brain/head 11/13/2017: 1. There appears to be occlusion of the right vertebral artery that has occurred since the 03/02/2017 CT angiogram. Additionally there is stenosis in the left vertebral artery, likely unchanged. There also appears to be  occlusion of flow in the mid basilar artery that was not present on the previous CT angiogram. 2. There is moderate stenosis of the carotid siphons bilaterally, also seen in the 03/02/2017 CTA. 3. Reduced branching pattern of the M2 segment of the left middle cerebral artery. 4. There did not appear to be any cerebral aneurysms noted. One small outpouching noted in the supraclinoid left ICA most likely represents an arterial siphon.  Patient presents today to discuss findings of her recent MRI/MRA from 11/13/2017. Patient awake and alert sitting in wheelchair with no complaints at this time. Accompanied by son. Denies headache, weakness, numbness/tingling, dizziness, vision changes, hearing changes, tinnitus, or speech difficulty.  Patient is currently taking Eliquis 5 mg once daily and Aspirin 81 mg once daily.   Past Medical History:  Diagnosis Date  . CAD (coronary artery disease)    a. anterior MI s/p PCI in 1992. b. cath 08/2015 with severe three-vessel CAD turned down for CABG and underwent DESx5 to prox Cx/OM2/D1/oRCA/mRCA  . Carotid artery disease ([place])    a. carotid duplex 03/2015 showed 1-39% BICA, normal subclavian arteries, chronically occluded left vertebral, f/u recommended only PRN.  . DVT (deep venous thrombosis) ([place])    X1  . History of nuclear stress test    a. Myoview 6/17: EF 20-25%, mid anteroseptal, apical anterior, apical septal, apical inferior, apical lateral and apical scar, no ischemia, intermediate risk  . HTN (hypertension)   . Hyperlipidemia   . Hypokalemia   . Hypothyroidism   . Ischemic cardiomyopathy    a. Echo 6/17: EF 20-25%, apex appears akinetic, MAC, moderate MR, moderate LAE, mild RVE, trivial PI, PASP 47 mmHg (needs repeat with Definity contrast).  b. Limited echo with Definity contrast 7/17: EF 25-30%, moderate to severe LAE. c. Limited Echo 2018 showed EF 40-45%.  . Longstanding  persistent atrial fibrillation ([place])   . MI (myocardial infarction)  ([place]) 1992  . PAD (peripheral artery disease) ([facility])    Right SFA occlusion, severe disease left CFA and SFA  . Stroke [facility])    a. 02/2017 in setting of noncompliance with Eliquis  . TIA (transient ischemic attack)   . Tricuspid regurgitation     Past Surgical History:  Procedure Laterality Date  . APPENDECTOMY     at hysterectomy and USO for fibroids, Dr. Ysidro Evert  . CARDIAC CATHETERIZATION  1992   Dr Eustace Quail  . CARDIAC CATHETERIZATION N/A 09/06/2015   Procedure: Right/Left Heart Cath and Coronary Angiography;  Surgeon: Burnell Blanks, MD;  Location: [place] [facility];  Service: Cardiovascular;  Laterality: N/A;  . CARDIAC CATHETERIZATION N/A 09/07/2015   Procedure: Coronary Stent Intervention;  Surgeon: Burnell Blanks, MD;  Location: [place] [facility];  Service: Cardiovascular;  Laterality: N/A;  . COLONOSCOPY     negative; 2008, Dr. Delfin Edis  . fracture LLE     '94; pinned  . IR ANGIO INTRA EXTRACRAN SEL COM CAROTID INNOMINATE BILAT MOD SED  03/02/2017  . IR ANGIO VERTEBRAL SEL VERTEBRAL UNI R MOD SED  03/02/2017  . IR RADIOLOGIST EVAL & MGMT  04/28/2017  . RADIOLOGY WITH ANESTHESIA N/A 03/02/2017   Procedure: RADIOLOGY WITH ANESTHESIA;  Surgeon: Luanne Bras, MD;  Location: [place];  Service: Radiology;  Laterality: N/A;  . TONSILLECTOMY    . TOTAL ABDOMINAL HYSTERECTOMY     & BSO for fibroids    Allergies: Definity [perflutren lipid microsphere] and Cilostazol  Medications: Prior to Admission medications   Medication Sig Start Date End Date Taking? Authorizing Provider  amLODipine (NORVASC) 2.5 MG tablet Take 1 tablet (2.5 mg total) by mouth daily. 03/31/17   Binnie Rail, MD  aspirin EC 81 MG tablet Take 1 tablet (81 mg total) by mouth daily. 04/21/17   Imogene Burn, PA-C  atorvastatin (LIPITOR) 40 MG tablet Take 2 tablets (80 mg total) by mouth daily. 08/06/17   Binnie Rail, MD  benazepril (LOTENSIN) 20 MG tablet Take 1.5 tablets (30 mg  total) by mouth daily. 08/06/17   Binnie Rail, MD  dorzolamide-timolol (COSOPT) 22.3-6.8 MG/ML ophthalmic solution 1 drop 2 (two) times daily.    [provider]  ELIQUIS 5 MG TABS tablet TAKE 1 TABLET TWICE DAILY 04/08/17   Burnell Blanks, MD  furosemide (LASIX) 40 MG tablet Take one tablet by mouth daily as needed for swelling/weight gain 12/01/17   Burnell Blanks, MD  levothyroxine (SYNTHROID, LEVOTHROID) 112 MCG tablet TAKE 1 TABLET(112 MCG) BY MOUTH DAILY 11/18/17   Binnie Rail, MD  metoprolol succinate (TOPROL-XL) 50 MG 24 hr tablet TAKE 1 TABLET BY MOUTH TWICE DAILY WITH OR IMMEDIATELY FOLLOWING A MEAL 05/02/17   Burnell Blanks, MD  nitroGLYCERIN (NITROSTAT) 0.4 MG SL tablet Place 1 tablet (0.4 mg total) under the tongue every 5 (five) minutes as needed for chest pain. 09/08/15   Arbutus Leas, NP  spironolactone (ALDACTONE) 25 MG tablet TAKE 1 TABLET(25 MG) BY MOUTH DAILY 11/18/17   Burns, Claudina Lick, MD  travoprost, benzalkonium, (TRAVATAN) 0.004 % ophthalmic solution Place 1 drop into both eyes at bedtime.    [provider]     Family History  Problem Relation Age of Onset  . Diabetes Mother   . Hypertension Mother   . Stroke Brother        ?> 55  .  Coronary artery disease Brother        stent in 82s  . Cancer Neg Hx     Social History   Socioeconomic History  . Marital status: Married    Spouse name: [name]  . Number of children: 1  . Years of education: college  . Highest education level: Not on file  Occupational History  . Occupation: Pharmacist, hospital    Comment: Retired  Scientific laboratory technician  . Financial resource strain: Not on file  . Food insecurity:    Worry: Not on file    Inability: Not on file  . Transportation needs:    Medical: Not on file    Non-medical: Not on file  Tobacco Use  . Smoking status: Former Smoker    Last attempt to quit: 03/11/1990    Years since quitting: 27.7  . Smokeless tobacco: Never Used  . Tobacco  comment: smoked [place], up to < 5 cigarettes  Substance and Sexual Activity  . Alcohol use: No  . Drug use: No  . Sexual activity: Not on file  Lifestyle  . Physical activity:    Days per week: Not on file    Minutes per session: Not on file  . Stress: Not on file  Relationships  . Social connections:    Talks on phone: Not on file    Gets together: Not on file    Attends religious service: Not on file    Active member of club or organization: Not on file    Attends meetings of clubs or organizations: Not on file    Relationship status: Not on file  Other Topics Concern  . 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 08/31/09- designated party form signed appointing, husband Bee Marchiano; ok to leave msg on home phone [phone].      Caffeine Small Amount one cup very rare.   Right handed.   One child- [name]     Review of Systems: A 12 point ROS discussed and pertinent positives are indicated in the HPI above.  All other systems are negative.  Review of Systems  Constitutional: Negative for chills and fever.  HENT: Negative for hearing loss and tinnitus.   Eyes: Negative for visual disturbance.  Respiratory: Negative for shortness of breath and wheezing.   Cardiovascular: Negative for chest pain and palpitations.  Neurological: Negative for dizziness, speech difficulty, weakness, numbness and headaches.  Psychiatric/Behavioral: Negative for behavioral problems and confusion.    Vital Signs: There were no vitals taken for this visit.  Physical Exam  Constitutional: She is oriented to person, place, and time. She appears well-developed and well-nourished. No distress.  Pulmonary/Chest: Effort normal. No respiratory distress.  Neurological: She is alert and oriented to person, place, and time.  Skin: Skin is warm and dry.  Psychiatric: She has a normal mood and affect. Her behavior is normal.  Judgment and thought content normal.  Nursing note and vitals reviewed.    Imaging: Mr Virgel Paling [facility] Contrast  Result Date: 11/14/2017  [facility] [address] [place],  [address] [phone] NEUROIMAGING REPORT STUDY DATE: 11/13/2017 PATIENT NAME: SEINI LANNOM DOB: Jul 24, 1940 MRN: [id number] EXAM: MR angiogram of the intracranial arteries ORDERING CLINICIAN: Venancio Poisson, NP CLINICAL HISTORY: 77 year old woman with history of stroke and carotid aneurysm COMPARISON FILMS: No MRA, compared to CTA 03/02/2017 TECHNIQUE: MR angiogram of the head was obtained utilizing 3D time  of flight sequences from below the vertebrobasilar junction up to the intracranial vasculature without contrast.  Computerized reconstructions were obtained. CONTRAST: None IMAGING SITE: [facility], [place], [place], [place] FINDINGS: There is moderate stenosis in the cavernous and supraclinoid segments of both internal carotid arteries.  No definite aneurysms are noted.  A small 2 mm outpouching in the left supraclinoid ICA most likely represents an arterial siphon.  The anterior cerebral arteries appear normal.  The right middle cerebral artery M1 and M2 segments appear normal.  There is mild stenosis of the M1 segment distally of the left middle cerebral artery and reduced branching pattern of the left M2 segments .  In the posterior circulation, there is stenosis of the left vertebral artery.  There is occlusion of the right vertebral artery.  There is reduced flow in the proximal and distal basilar artery with no observed mid basilar flow.  Flow in the distal basilar artery could be coming from the anterior circulation through the posterior communicating arteries.  Of note, on the 03/02/2017 CTA, there was a dominant right vertebral artery, stenotic left vertebral artery and normal basilar artery    the left posterior cerebral artery appears normal.  There is mild stenosis in  the distal P2 segment of the right posterior cerebral artery.     This MR angiogram of intracranial arteries shows the following: 1.    There appears to be occlusion of the right vertebral artery that has occurred since the 03/02/2017 CT angiogram.    Additionally there is stenosis in the left vertebral artery, likely unchanged.  There also appears to be occlusion of flow in the mid basilar artery that was not present on the previous CT angiogram. 2.     There is moderate stenosis of the carotid siphons bilaterally, also seen in the 03/02/2017 CTA. 3.     Reduced branching pattern of the M2 segment of the left middle cerebral artery. 4.     There did not appear to be any cerebral aneurysms noted.  One small outpouching noted in the supraclinoid left ICA most likely represents an arterial siphon. INTERPRETING PHYSICIAN: Richard A. Felecia Shelling, MD, PhD, FAAN Certified in  Neuroimaging by [employer]   Mr Jeri [facility] Contrast  Result Date: 11/14/2017  [facility] [address], [place], [place] [address] [phone] NEUROIMAGING REPORT STUDY DATE: 11/13/2017 PATIENT NAME: COLENE MINES DOB: 12/30/1940 MRN: [id number] EXAM: MRI Brain with and without contrast ORDERING CLINICIAN: Venancio Poisson, NP CLINICAL HISTORY: 77 year old woman with history of stroke and carotid aneurysm COMPARISON FILMS: MRI 03/03/2017 TECHNIQUE:MRI of the brain with and without contrast was obtained utilizing 5 mm axial slices with T1, T2, T2 flair, SWI and diffusion weighted views.  T1 sagittal, T2 coronal and postcontrast views in the axial and coronal plane were obtained. CONTRAST: 10 ml [employer] IMAGING SITE: [facility], [place]. FINDINGS: On sagittal images, the spinal cord is imaged caudally to C3 and is normal in caliber.   The contents of the posterior fossa are of normal size and position.   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  This appears unchanged compared to 03/03/2017 MRI.  The optic chiasm appear normal.    Moderately severe generalized cortical atrophy and corpus callosal atrophy.  Incidental note is made of a cavum septum pellucidum.  There are no abnormal extra-axial collections of fluid.  There are small chronic lacunar infarctions  in the cerebellum.  There are minimal T2/FLAIR hypertense foci in the pons.  There is a remote hemorrhagic right basal ganglia stroke, unchanged compared to the 2018 MRI.  In the hemispheres, there is a left frontoparietal stroke that has evolved compared to the 03/03/2017 MRI when it was more acute.  There is also a remote right frontal lobe stroke.  Additionally, there are extensive T2/FLAIR hypertense foci consistent with chronic microvascular ischemic change with a few superimposed small lacunar infarctions..  There are no acute ischemic findings.    Diffusion weighted images are normal.  Susceptibility weighted images showed the [place] of the remote hemorrhagic stroke in the right basal ganglia. The orbits appear normal.   The VIIth/VIIIth nerve complex appears normal.  The mastoid air cells appear normal.  The paranasal sinuses appear normal.  Flow voids are identified within the major intracerebral arteries.  After the infusion of contrast material, a normal enhancement pattern is noted.   This MRI of the brain with and without contrast shows the following: 1.     Chronic left frontoparietal stroke that has evolved compared to the 03/03/2017 MRI when it was more acute.   There is also a remote right frontal lobe stroke 2.     Remote right basal ganglia hemorrhagic stroke 3.     Moderate chronic microvascular ischemic change. 4.     Moderately severe generalized cortical atrophy and corpus callosal atrophy 5.     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 Felecia Shelling, MD, PhD, FAAN Certified in  Neuroimaging by [place] [employer] Neuroimaging    Labs:  CBC: Recent Labs    03/02/17 1716 03/02/17 1717 03/07/17 0601 03/11/17 0607 03/17/17 1154  WBC 9.9  --  10.9* 10.2 11.7*  HGB 13.8 15.0 13.0 12.9 12.4  HCT 42.3 44.0 40.4 39.3 37.9  PLT 257  --  200 240 284    COAGS: Recent Labs    03/02/17 1716  INR 1.09  APTT 32    BMP: Recent Labs    03/04/17 0250 03/07/17 0601 03/10/17 0728 03/17/17 0611  NA 140 139 139 140  K 3.7 3.7 3.6 3.4*  CL 106 106 110 106  CO2 25 24 21* 26  GLUCOSE 115* 101* 107* 97  BUN 8 20 14 13   CALCIUM 8.8* 8.5* 8.7* 8.7*  CREATININE 1.10* 1.08* 0.97 0.97  GFRNONAA 48* 49* 55* 55*  GFRAA 55* 56* >60 >60    LIVER FUNCTION TESTS: Recent Labs    02/04/17 1707 03/02/17 1716 03/07/17 0601 06/03/17 0921  BILITOT 0.8 0.7 0.8 0.7  AST 18 28 33 20  ALT 14 19 18 18   ALKPHOS 111 116 97 130*  PROT 7.4 7.1 6.3* 6.8  ALBUMIN 4.1 4.0 3.1* 4.0    TUMOR MARKERS: No results for input(s): AFPTM, CEA, CA199, CHROMGRNA in the last 8760 hours.  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 Reviewed imaging with patient and son. First brought to their attention was patient's three intracranial aneurysms at the following locations: left ICA cavernous segment, left ICA at the level of the ophthalmic artery, and right ICA cavernous segment. These aneurysms appear stable based on imaging compared to diagnostic cerebral angiogram 03/02/2017. Brought to their attention next was patient's vertebral arteries. Her imaging studies show flow demonstrated in the left vertebrobasilar junction with  absent flow in the distal right vertebrobasilar junction and poor flow in the mid basilar artery. Informed patient that these are new changes since diagnostic cerebral angiogram 03/02/2018. Since patient is grossly asymptomatic at this time, the  recommended course of management at this time is with an image-guided diagnostic cerebral angiogram. Explained procedure, including risks and benefits. Explained this procedure will help [place] get a more accurate description of her stenosis. Patient expresses desire to move forward with procedure.  Discussed patient's hypertension management. Patient's son states that she is non-compliant with some of her hypertension medications. Strongly advised patient to all of take her hypertension medications as directed.  Plan for follow-up with an image-guided diagnostic cerebral angiogram in 3 months. Informed patient that our schedulers will call her to set up this procedure. Explained that if patient notices vision changes (blurred vision, diplopia), dizziness, or balance/coordination issues, to call 911 and let us know immediately. If this is to happen, plan to have diagnostic cerebral angiogram earlier than in 3 months. Instructed patient to continue taking Eliquis 5 mg once daily and Aspirin 81 mg once daily.  All questions answered and concerns addressed. Patient and son convey understanding and agree with plan.  Thank you for this interesting consult.  I greatly enjoyed meeting Yvonne Bailey and look forward to participating in their care.  A copy of this report was sent to the requesting provider on this date.  Electronically Signed: Earley Abide, PA-C 12/05/2017, 9:37 AM   I spent a total of 40 Minutes in face to face in clinical consultation, greater than 50% of which was counseling/coordinating care for intracranial aneurysms x3 AND left vertebral artery stenosis.

## 2017-12-24 DIAGNOSIS — H40052 Ocular hypertension, left eye: Secondary | ICD-10-CM | POA: Diagnosis not present

## 2017-12-24 DIAGNOSIS — H401133 Primary open-angle glaucoma, bilateral, severe stage: Secondary | ICD-10-CM | POA: Diagnosis not present

## 2018-02-03 ENCOUNTER — Ambulatory Visit: Payer: Medicare HMO | Admitting: Internal Medicine

## 2018-02-03 DIAGNOSIS — H25813 Combined forms of age-related cataract, bilateral: Secondary | ICD-10-CM | POA: Diagnosis not present

## 2018-02-03 DIAGNOSIS — H401123 Primary open-angle glaucoma, left eye, severe stage: Secondary | ICD-10-CM | POA: Diagnosis not present

## 2018-02-03 DIAGNOSIS — H25812 Combined forms of age-related cataract, left eye: Secondary | ICD-10-CM | POA: Diagnosis not present

## 2018-02-03 DIAGNOSIS — H401112 Primary open-angle glaucoma, right eye, moderate stage: Secondary | ICD-10-CM | POA: Diagnosis not present

## 2018-02-03 DIAGNOSIS — H25811 Combined forms of age-related cataract, right eye: Secondary | ICD-10-CM | POA: Diagnosis not present

## 2018-02-13 ENCOUNTER — Other Ambulatory Visit: Payer: Self-pay | Admitting: Internal Medicine

## 2018-02-13 ENCOUNTER — Other Ambulatory Visit: Payer: Self-pay

## 2018-02-13 MED ORDER — BENAZEPRIL HCL 20 MG PO TABS: 30.0000 mg | ORAL_TABLET | Freq: Every day | ORAL | 0 refills | Status: DC

## 2018-02-23 ENCOUNTER — Other Ambulatory Visit: Payer: Self-pay | Admitting: Internal Medicine

## 2018-03-15 ENCOUNTER — Other Ambulatory Visit: Payer: Self-pay | Admitting: Internal Medicine

## 2018-03-16 ENCOUNTER — Ambulatory Visit: Payer: Medicare HMO | Admitting: Adult Health

## 2018-03-16 ENCOUNTER — Encounter: Payer: Self-pay | Admitting: Adult Health

## 2018-03-16 VITALS — BP 142/80 | HR 75 | Ht 66.0 in | Wt 215.0 lb

## 2018-03-16 DIAGNOSIS — I63412 Cerebral infarction due to embolism of left middle cerebral artery: Secondary | ICD-10-CM

## 2018-03-16 DIAGNOSIS — I10 Essential (primary) hypertension: Secondary | ICD-10-CM | POA: Diagnosis not present

## 2018-03-16 DIAGNOSIS — I6502 Occlusion and stenosis of left vertebral artery: Secondary | ICD-10-CM | POA: Diagnosis not present

## 2018-03-16 DIAGNOSIS — I480 Paroxysmal atrial fibrillation: Secondary | ICD-10-CM

## 2018-03-16 DIAGNOSIS — E785 Hyperlipidemia, unspecified: Secondary | ICD-10-CM

## 2018-03-16 NOTE — Progress Notes (Signed)
[facility] [address] [place]. [place] [address] [phone]       OFFICE FOLLOW-UP NOTE  Yvonne Bailey Date of Birth:  12-Jun-1940 Medical Record Number:  [id number]   HPI: Yvonne Bailey is a 24 year pleasant African-American lady seen today for stroke in December 2018. History is obtained from patient and review of electronic medical records. I have personally reviewed imaging films. Yvonne Bailey a 78 y.o.femalewith history of DVT on Eliquis, coronary artery disease with previous MI, carotid artery disease, hypertension, lipidemia, ischemic cardiomyopathy and peripheral artery diseasepresenting with aphasia and possible confusion.Shedidnot receive IV t-PA due being to anticoagulation with eliquis but it was unclear whether she was taking it correctly. She was probably taking it only once a day and not twice a day as scheduled. CT scan of the head on admission showed no acute abnormality but showed small remote age infarcts. MRI scan of the brain showed acute nonhemorrhagic left parietal MCA branch infarct and old right basal ganglia as well as small remote age bifrontal infarcts. MRA of the brain was not done. Cerebral catheter angiogram was performed which showed slow recanalizing anterior parietal branch of the inferior division of the left MCA M2/M3 junction which was not felt to be amenable for endovascular treatment. 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 transthoracic echo showed normal ejection fraction. LDL cholesterol was 94 mg percent. Hemoglobin A1c was 5.4. Patient had mild expressive aphasia and eliquis was resumed and patient counseled to take the correct dose. Patient was felt to be a candidate for inpatient rehabilitation and was transferred there when bed was available.   06/04/2017 visit PS: Patient states she's done well since discharge. She is finished outpatient therapies. She still gets  occasionally her words mixed and has trouble completing sentences and uses paraphasic errors particularly when she is under stress or anxious or tries to talk fast. She is otherwise quite independent in activities of daily living. She is having some increasing difficulty with incontinence and has started wearing adult briefs at night. Her blood pressure is well controlled and today it is 140/76. She is tolerating Lipitor well without muscle aches or pains. She and her son have a lot of questions about her asymptomatic cerebral aneurysm and I discussed risk benefits of treatment and natural history of rupture with them  09/04/2017 update: Patient returns today for 31-monthfollow-up.  Overall she continues to do well without residual stroke deficits and he has returned to all previous activities besides driving.  Patient continues to take both Eliquis and aspirin without bleeding or bruising and is compliant.  Continues to take Lipitor without side effects myalgias.  Blood pressure today satisfactory 120/66.  She does deny headaches or new onset visual problems.  She does say at times her vision will get blurry such as some things floating but will be seeing ophthalmologist in approximately 1 month and does have a history of cataracts.  Patient questioning whether she can start driving at this time.  Unsure if patient has a cognitive ability to be able to be driving and would not be safe with visual concerns.  Denies new or worsening stroke/TIA symptoms.  Interval history 03/16/2018: Patient is being seen today for 673-monthollow-up visit and is accompanied by her son.  She did undergo repeat MRA which showed stable intracranial aneurysms.  The imaging did show new changes compared to her cerebral angiogram on 02/2017 showing left vertebral basilar junction  with absent flow in the distal right vertebrobasilar junction and poor flow in the mid basilar artery.  She was referred to vascular surgery where they  recommended image guided diagnostic cerebral angiogram around 02/2018.  Son states they are awaiting a call to schedule follow-up appointment.  From a stroke standpoint, she continues to do well without recurring deficits.  She continues to take Eliquis and aspirin without side effects of bleeding or bruising.  Continues to take atorvastatin without side effects myalgias.  Blood pressure today 142/80.  Patient does not routinely monitor at home but does state that it typically ranges from 1 30-1 40 when she is at provider appointments.  No further concerns at this time.  Denies new or worsening stroke/TIA symptoms.    ROS:   14 system review of systems is positive for no complaints and all other systems negative  PMH:  Past Medical History:  Diagnosis Date  . CAD (coronary artery disease)    a. anterior MI s/p PCI in 1992. b. cath 08/2015 with severe three-vessel CAD turned down for CABG and underwent DESx5 to prox Cx/OM2/D1/oRCA/mRCA  . Carotid artery disease ([place])    a. carotid duplex 03/2015 showed 1-39% BICA, normal subclavian arteries, chronically occluded left vertebral, f/u recommended only PRN.  . DVT (deep venous thrombosis) ([place])    X1  . History of nuclear stress test    a. Myoview 6/17: EF 20-25%, mid anteroseptal, apical anterior, apical septal, apical inferior, apical lateral and apical scar, no ischemia, intermediate risk  . HTN (hypertension)   . Hyperlipidemia   . Hypokalemia   . Hypothyroidism   . Ischemic cardiomyopathy    a. Echo 6/17: EF 20-25%, apex appears akinetic, MAC, moderate MR, moderate LAE, mild RVE, trivial PI, PASP 47 mmHg (needs repeat with Definity contrast).  b. Limited echo with Definity contrast 7/17: EF 25-30%, moderate to severe LAE. c. Limited Echo 2018 showed EF 40-45%.  . Longstanding persistent atrial fibrillation   . MI (myocardial infarction) ([place]) 1992  . PAD (peripheral artery disease) ([facility])    Right SFA occlusion, severe disease left CFA and SFA   . Stroke [facility])    a. 02/2017 in setting of noncompliance with Eliquis  . TIA (transient ischemic attack)   . Tricuspid regurgitation     Social History:  Social History   Socioeconomic History  . Marital status: Married    Spouse name: [name]  . Number of children: 1  . Years of education: college  . Highest education level: Not on file  Occupational History  . Occupation: Pharmacist, hospital    Comment: Retired  Scientific laboratory technician  . Financial resource strain: Not on file  . Food insecurity:    Worry: Not on file    Inability: Not on file  . Transportation needs:    Medical: Not on file    Non-medical: Not on file  Tobacco Use  . Smoking status: Former Smoker    Last attempt to quit: 03/11/1990    Years since quitting: 28.0  . Smokeless tobacco: Never Used  . Tobacco comment: smoked [place], up to < 5 cigarettes  Substance and Sexual Activity  . Alcohol use: No  . Drug use: No  . Sexual activity: Not on file  Lifestyle  . Physical activity:    Days per week: Not on file    Minutes per session: Not on file  . Stress: Not on file  Relationships  . Social connections:    Talks on phone:  Not on file    Gets together: Not on file    Attends religious service: Not on file    Active member of club or organization: Not on file    Attends meetings of clubs or organizations: Not on file    Relationship status: Not on file  . Intimate partner violence:    Fear of current or ex partner: Not on file    Emotionally abused: Not on file    Physically abused: Not on file    Forced sexual activity: Not on file  Other Topics Concern  . 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 08/31/09- designated party form signed appointing, husband Manvi Guilliams; ok to leave msg on home phone [phone].      Caffeine Small Amount one cup very rare.   Right handed.   One child- [name]    Medications:   Current Outpatient  Medications on File Prior to Visit  Medication Sig Dispense Refill  . amLODipine (NORVASC) 2.5 MG tablet Take 1 tablet (2.5 mg total) by mouth daily. 90 tablet 3  . apixaban (ELIQUIS) 5 MG TABS tablet     . aspirin EC 81 MG tablet Take 1 tablet (81 mg total) by mouth daily. 90 tablet 3  . atorvastatin (LIPITOR) 40 MG tablet Take 1 tablet (40 mg total) by mouth daily. -- Office visit needed for further refills 180 tablet 0  . benazepril (LOTENSIN) 20 MG tablet Take 0.5 tablets (10 mg total) by mouth daily. -- Office visit needed for further refills 45 tablet 0  . benazepril (LOTENSIN) 20 MG tablet     . brimonidine (ALPHAGAN) 0.2 % ophthalmic solution INT 1 GTT IN OS TID    . dorzolamide-timolol (COSOPT) 22.3-6.8 MG/ML ophthalmic solution 1 drop 2 (two) times daily.    Marland Kitchen ELIQUIS 5 MG TABS tablet TAKE 1 TABLET TWICE DAILY 180 tablet 3  . furosemide (LASIX) 40 MG tablet Take one tablet by mouth daily as needed for swelling/weight gain 30 tablet 6  . levothyroxine (SYNTHROID, LEVOTHROID) 112 MCG tablet Take 1 tablet (112 mcg total) by mouth daily. -- Office visit needed for further refills 90 tablet 0  . metoprolol succinate (TOPROL-XL) 50 MG 24 hr tablet TAKE 1 TABLET BY MOUTH TWICE DAILY WITH OR IMMEDIATELY FOLLOWING A MEAL 180 tablet 3  . nitroGLYCERIN (NITROSTAT) 0.4 MG SL tablet Place 1 tablet (0.4 mg total) under the tongue every 5 (five) minutes as needed for chest pain. 25 tablet 1  . spironolactone (ALDACTONE) 25 MG tablet Take 1 tablet (25 mg total) by mouth daily. -- Office visit needed for further refills 90 tablet 0  . travoprost, benzalkonium, (TRAVATAN) 0.004 % ophthalmic solution Place 1 drop into both eyes at bedtime.     No current facility-administered medications on file prior to visit.     Allergies:   Allergies  Allergen Reactions  . Definity [Perflutren Lipid Microsphere] Other (See Comments)    Patient experienced back pain with injection  . Cilostazol Other (See  Comments)     Pletal :" head felt funny"    Physical Exam General: pleasant elderly African-American lady, seated, in no evident distress Head: head normocephalic and atraumatic.  Neck: supple with no carotid or supraclavicular bruits Cardiovascular: regular rate and rhythm, no murmurs Musculoskeletal: no deformity Skin:  no rash/petichiae Vascular:  Normal pulses all extremities Vitals:   03/16/18 1448  BP: (!) 142/80  Pulse: 75   Neurologic Exam Mental Status: Awake and fully alert. Oriented to place and time. Remote memory intact.  Questionable intact recent memory as patient was having a hard time following level conversation during appointment and frequently re-asking questions that were just discussed.  Patient was alone at today's appointment. Mood and affect appropriate. And occasional paraphasic errors and slight disfluency in speech.  Cranial Nerves: Fundoscopic exam reveals sharp disc margins. Pupils equal, briskly reactive to light. Extraocular movements full without nystagmus. Visual fields full to confrontation. Hearing intact. Facial sensation intact. Face, tongue, palate moves normally and symmetrically.  Motor: Normal bulk and tone. Normal strength in all tested extremity muscles. Sensory.: intact to touch ,pinprick .position and vibratory sensation.  Coordination: Rapid alternating movements normal in all extremities. Finger-to-nose and heel-to-shin performed accurately bilaterally. Gait and Station: Arises from chair without difficulty. Stance is normal. Gait demonstrates normal stride length and balance . Able to heel, toe and tandem walk with slight difficulty.  Reflexes: 1+ and symmetric. Toes downgoing.    IMAGING:  MR MRA HEAD WO CONTRAST MR BRAIN W WO CONTRAST 11/13/2017 IMPRESSION: This MR angiogram of intracranial arteries shows the following: 1.    There appears to be occlusion of the right vertebral artery that has occurred since the 03/02/2017 CT  angiogram.    Additionally there is stenosis in the left vertebral artery, likely unchanged.  There also appears to be occlusion of flow in the mid basilar artery that was not present on the previous CT angiogram. 2.     There is moderate stenosis of the carotid siphons bilaterally, also seen in the 03/02/2017 CTA. 3.     Reduced branching pattern of the M2 segment of the left middle cerebral artery. 4.     There did not appear to be any cerebral aneurysms noted.  One small outpouching noted in the supraclinoid left ICA most likely represents an arterial siphon.   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 Asymptomatic 10 x 5.7 mm left cavernous carotid artery aneurysm and fibrillation, hypertension, hyperlipidemia.  Patient returns today for follow-up visit and overall is doing well without residual deficits or recurring of symptoms.    PLAN: -Continue aspirin 81 mg daily and Eliquis (apixaban) daily  and Lipitor for secondary stroke prevention -Advised patient to follow-up with vascular surgery in regards to scheduling recommended cerebral angiogram.  Also advised patient that this information will be passed along to vascular surgery -F/u with PCP regarding routine monitoring -f/u with cardiologist regarding atrial fibrillation, hyperlipidemia and hypertension along with Eliquis management -continue to monitor BP at home -Maintain strict control of hypertension with blood pressure goal below 130/90, diabetes with hemoglobin A1c goal below 6.5% and cholesterol with LDL cholesterol (bad cholesterol) goal below 70 mg/dL. I also advised the patient to eat a healthy diet with plenty of whole grains, cereals, fruits and vegetables, exercise regularly and maintain ideal body weight.  Follow up as needed as stable from stroke standpoint but advised to call with questions, concerns or need of future follow-up  appointment   Greater than 50% of time during this 25 minute visit was spent on counseling,explanation of diagnosis of embolic stroke, atrial fibrillation, asymptomatic cerebral aneurysm, planning of further management, discussion with patient and family and coordination of care  Venancio Poisson, [facility]  [facility] [address] [place], [place] [address]  Phone [phone] Fax [phone] Note: This document was prepared with  digital dictation and possible smart Company secretary. Any transcriptional errors that result from this process are unintentional.

## 2018-03-16 NOTE — Patient Instructions (Signed)
Continue aspirin 81 mg daily and Eliquis (apixaban) daily  and lipitor  for secondary stroke prevention  Continue to follow up with PCP regarding cholesterol, blood pressure and diabetes management   Follow up with vascular surgery to undergo possible procedure  Continue to follow up with cardiology for atrial fibrillation  Continue to monitor blood pressure at home  Maintain strict control of hypertension with blood pressure goal below 130/90, diabetes with hemoglobin A1c goal below 6.5% and cholesterol with LDL cholesterol (bad cholesterol) goal below 70 mg/dL. I also advised the patient to eat a healthy diet with plenty of whole grains, cereals, fruits and vegetables, exercise regularly and maintain ideal body weight.  Followup in the future with me as needed or call earlier if needed       Thank you for coming to see [place] at [facility]. I hope we have been able to provide you high quality care today.  You may receive a patient satisfaction survey over the next few weeks. We would appreciate your feedback and comments so that we may continue to improve ourselves and the health of our patients.

## 2018-03-23 ENCOUNTER — Other Ambulatory Visit (HOSPITAL_COMMUNITY): Payer: Self-pay | Admitting: Interventional Radiology

## 2018-03-23 ENCOUNTER — Telehealth (HOSPITAL_COMMUNITY): Payer: Self-pay

## 2018-03-23 DIAGNOSIS — I729 Aneurysm of unspecified site: Secondary | ICD-10-CM

## 2018-03-23 NOTE — Telephone Encounter (Signed)
Called to schedule angio, no answer, no vm. AW 

## 2018-03-27 NOTE — Progress Notes (Signed)
I agree with the above plan 

## 2018-03-31 DIAGNOSIS — N7689 Other specified inflammation of vagina and vulva: Secondary | ICD-10-CM | POA: Diagnosis not present

## 2018-03-31 DIAGNOSIS — N390 Urinary tract infection, site not specified: Secondary | ICD-10-CM | POA: Diagnosis not present

## 2018-04-01 ENCOUNTER — Other Ambulatory Visit: Payer: Self-pay | Admitting: Radiology

## 2018-04-02 ENCOUNTER — Telehealth: Payer: Self-pay | Admitting: Cardiovascular Disease

## 2018-04-02 DIAGNOSIS — I4819 Other persistent atrial fibrillation: Secondary | ICD-10-CM

## 2018-04-02 NOTE — Telephone Encounter (Signed)
New message        [facility] Pre-operative Risk Assessment    Request for surgical clearance:  1. What type of surgery is being performed? Angiogran  2. When is this surgery scheduled? TBD  Or 04/10/18  3. What type of clearance is required (medical clearance vs. Pharmacy clearance to hold med vs. Both)? Pharmacy clearance   4. Are there any medications that need to be held prior to surgery and how long? Eliquis for 48 Hrs   Practice name and name of physician performing surgery? Dr. Annamarie Major at [facility]   5. What is your office phone number [phone]    7.   What is your office fax number [phone]  8.   Anesthesia type (None, local, MAC, general) ? None    Francoise Schaumann 04/02/2018, 3:50 PM  _________________________________________________________________   (provider comments below)

## 2018-04-03 ENCOUNTER — Ambulatory Visit (HOSPITAL_COMMUNITY): Admission: RE | Admit: 2018-04-03 | Payer: Medicare HMO | Source: Ambulatory Visit

## 2018-04-03 ENCOUNTER — Encounter (HOSPITAL_COMMUNITY): Payer: Self-pay

## 2018-04-03 NOTE — Telephone Encounter (Signed)
Last labs were January 2019.  Spoke with patient, she doesn't recall any recent blood work.  Will have her get CBC, CMET Monday morning.

## 2018-04-06 ENCOUNTER — Other Ambulatory Visit: Payer: Medicare HMO

## 2018-04-10 NOTE — Telephone Encounter (Signed)
Patient with diagnosis of atrial fibrillation on Eliquis for anticoagulation.    Procedure: angiogram Date of procedure: TBD  CHADS2-VASc score of  8 (CHF, HTN, AGE, , stroke/tia x 2, CAD, AGE, female)  Note that labs are from 1.7.2019.  Patient was contacted to repeat labs for updated SCr, however this was not done.   CrCl 75.6 Platelet count 370  Per office protocol, patient can hold Eliquis for 2 days prior to procedure.    Patient should restart Eliquis on the evening of procedure or day after, at discretion of procedure MD

## 2018-04-10 NOTE — Telephone Encounter (Signed)
I don't, I've been watching to see if she had them drawn.  I'll call her this am to see if she went somewhere else.

## 2018-04-13 ENCOUNTER — Other Ambulatory Visit: Payer: Medicare HMO | Admitting: *Deleted

## 2018-04-13 DIAGNOSIS — I4819 Other persistent atrial fibrillation: Secondary | ICD-10-CM | POA: Diagnosis not present

## 2018-04-13 NOTE — Progress Notes (Signed)
Subjective:    Patient ID: Yvonne Bailey, female    DOB: Apr 03, 1940, 78 y.o.   MRN: [id number]  HPI The patient is here for an acute visit.   Burn and itch when urinating:  She went to urgent care on 03/30/18 for these same symptoms, was diagnosed with a UTI and was prescribed Macrobid and diflucan.   Her symptoms did not get better.  She continues to have symptoms.  She states itching in the vulvar area.  She has burning sensation that occurs with and without urinating.  She is unsure if the burning is on the outside or not.  She has dysuria sometimes.    She denies malordorous urine, hematuria, increased urination, fever, chills, nausea, abdominal pain and back pain.      She does wear depends and wonders if that is causing the problem.   Medications and allergies reviewed with patient and updated if appropriate.  Patient Active Problem List   Diagnosis Date Noted  . Leukocytosis   . Aphasia as late effect of cerebrovascular accident (CVA)   . Acute ischemic cerebrovascular accident (CVA) involving left middle cerebral artery territory [facility])   . Expressive aphasia   . Coronary artery disease involving native coronary artery without angina pectoris   . Stage 3 chronic kidney disease ([place])   . Stroke (cerebrum) ([place]) 03/02/2017  . Prediabetes 07/28/2016  . DOE (dyspnea on exertion) 07/09/2016  . Gait instability 07/09/2016  . Itching 04/16/2016  . Unstable angina ([place]) 09/06/2015  . Cardiomyopathy, ischemic   . Chronic systolic heart failure ([place]) 08/21/2015  . Bilateral leg edema 07/19/2015  . Atrial fibrillation ([place]) 07/19/2015  . Urinary urgency 03/30/2015  . Other musculoskeletal symptoms referable to limbs(729.89) 12/17/2012  . Lacunar infarction ([place]) 12/17/2012  . Small vessel disease, cerebrovascular 12/17/2012  . CAROTID BRUIT, RIGHT 08/10/2008  . Peripheral vascular disease ([place]) 06/04/2007  . Diverticulosis of large intestine 06/04/2007  . Hypothyroidism  02/24/2007  . Hyperlipidemia 02/24/2007  . Essential hypertension 02/24/2007  . Acute thromboembolism of deep veins of lower extremity ([place]) 01/21/2007  . Coronary atherosclerosis 10/29/2006    Current Outpatient Medications on File Prior to Visit  Medication Sig Dispense Refill  . amLODipine (NORVASC) 2.5 MG tablet Take 1 tablet (2.5 mg total) by mouth daily. 90 tablet 3  . apixaban (ELIQUIS) 5 MG TABS tablet     . aspirin EC 81 MG tablet Take 1 tablet (81 mg total) by mouth daily. 90 tablet 3  . atorvastatin (LIPITOR) 40 MG tablet Take 1 tablet (40 mg total) by mouth daily. -- Office visit needed for further refills 180 tablet 0  . benazepril (LOTENSIN) 20 MG tablet Take 0.5 tablets (10 mg total) by mouth daily. -- Office visit needed for further refills 45 tablet 0  . brimonidine (ALPHAGAN) 0.2 % ophthalmic solution Place 1 drop into both eyes 3 (three) times daily.     . dorzolamide-timolol (COSOPT) 22.3-6.8 MG/ML ophthalmic solution Place 1 drop into both eyes 2 (two) times daily.     . furosemide (LASIX) 40 MG tablet Take one tablet by mouth daily as needed for swelling/weight gain 30 tablet 6  . levothyroxine (SYNTHROID, LEVOTHROID) 112 MCG tablet Take 1 tablet (112 mcg total) by mouth daily. -- Office visit needed for further refills 90 tablet 0  . metoprolol succinate (TOPROL-XL) 50 MG 24 hr tablet TAKE 1 TABLET BY MOUTH TWICE DAILY WITH OR IMMEDIATELY FOLLOWING A MEAL (Patient taking differently: Take 50 mg by  mouth daily. ) 180 tablet 3  . nitroGLYCERIN (NITROSTAT) 0.4 MG SL tablet Place 1 tablet (0.4 mg total) under the tongue every 5 (five) minutes as needed for chest pain. 25 tablet 1  . spironolactone (ALDACTONE) 25 MG tablet Take 1 tablet (25 mg total) by mouth daily. -- Office visit needed for further refills 90 tablet 0  . travoprost, benzalkonium, (TRAVATAN) 0.004 % ophthalmic solution Place 1 drop into both eyes at bedtime.     No current facility-administered medications  on file prior to visit.     Past Medical History:  Diagnosis Date  . CAD (coronary artery disease)    a. anterior MI s/p PCI in 1992. b. cath 08/2015 with severe three-vessel CAD turned down for CABG and underwent DESx5 to prox Cx/OM2/D1/oRCA/mRCA  . Carotid artery disease ([place])    a. carotid duplex 03/2015 showed 1-39% BICA, normal subclavian arteries, chronically occluded left vertebral, f/u recommended only PRN.  . DVT (deep venous thrombosis) ([place])    X1  . History of nuclear stress test    a. Myoview 6/17: EF 20-25%, mid anteroseptal, apical anterior, apical septal, apical inferior, apical lateral and apical scar, no ischemia, intermediate risk  . HTN (hypertension)   . Hyperlipidemia   . Hypokalemia   . Hypothyroidism   . Ischemic cardiomyopathy    a. Echo 6/17: EF 20-25%, apex appears akinetic, MAC, moderate MR, moderate LAE, mild RVE, trivial PI, PASP 47 mmHg (needs repeat with Definity contrast).  b. Limited echo with Definity contrast 7/17: EF 25-30%, moderate to severe LAE. c. Limited Echo 2018 showed EF 40-45%.  . Longstanding persistent atrial fibrillation   . MI (myocardial infarction) ([place]) 1992  . PAD (peripheral artery disease) ([facility])    Right SFA occlusion, severe disease left CFA and SFA  . Stroke [facility])    a. 02/2017 in setting of noncompliance with Eliquis  . TIA (transient ischemic attack)   . Tricuspid regurgitation     Past Surgical History:  Procedure Laterality Date  . APPENDECTOMY     at hysterectomy and USO for fibroids, Dr. Ysidro Evert  . CARDIAC CATHETERIZATION  1992   Dr Eustace Quail  . CARDIAC CATHETERIZATION N/A 09/06/2015   Procedure: Right/Left Heart Cath and Coronary Angiography;  Surgeon: Burnell Blanks, MD;  Location: [facility];  Service: Cardiovascular;  Laterality: N/A;  . CARDIAC CATHETERIZATION N/A 09/07/2015   Procedure: Coronary Stent Intervention;  Surgeon: Burnell Blanks, MD;  Location: [place] [facility];  Service:  Cardiovascular;  Laterality: N/A;  . COLONOSCOPY     negative; 2008, Dr. Delfin Edis  . fracture LLE     '94; pinned  . IR ANGIO INTRA EXTRACRAN SEL COM CAROTID INNOMINATE BILAT MOD SED  03/02/2017  . IR ANGIO VERTEBRAL SEL VERTEBRAL UNI R MOD SED  03/02/2017  . IR RADIOLOGIST EVAL & MGMT  04/28/2017  . RADIOLOGY WITH ANESTHESIA N/A 03/02/2017   Procedure: RADIOLOGY WITH ANESTHESIA;  Surgeon: Luanne Bras, MD;  Location: [facility];  Service: Radiology;  Laterality: N/A;  . TONSILLECTOMY    . TOTAL ABDOMINAL HYSTERECTOMY     & BSO for fibroids    Social History   Socioeconomic History  . Marital status: Married    Spouse name: [name]  . Number of children: 1  . Years of education: college  . Highest education level: Not on file  Occupational History  . Occupation: Pharmacist, hospital    Comment: Retired  Scientific laboratory technician  . Financial resource strain:  Not on file  . Food insecurity:    Worry: Not on file    Inability: Not on file  . Transportation needs:    Medical: Not on file    Non-medical: Not on file  Tobacco Use  . Smoking status: Former Smoker    Last attempt to quit: 03/11/1990    Years since quitting: 28.1  . Smokeless tobacco: Never Used  . Tobacco comment: smoked [place], up to < 5 cigarettes  Substance and Sexual Activity  . Alcohol use: No  . Drug use: No  . Sexual activity: Not on file  Lifestyle  . Physical activity:    Days per week: Not on file    Minutes per session: Not on file  . Stress: Not on file  Relationships  . Social connections:    Talks on phone: Not on file    Gets together: Not on file    Attends religious service: Not on file    Active member of club or organization: Not on file    Attends meetings of clubs or organizations: Not on file    Relationship status: Not on file  Other Topics Concern  . 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 08/31/09- designated party form signed  appointing, husband Azalyn Sliwa; ok to leave msg on home phone [phone].      Caffeine Small Amount one cup very rare.   Right handed.   One child- [name]    Family History  Problem Relation Age of Onset  . Diabetes Mother   . Hypertension Mother   . Stroke Brother        ?> 55  . Coronary artery disease Brother        stent in 76s  . Cancer Neg Hx     Review of Systems  Constitutional: Negative for chills and fever.  Gastrointestinal: Negative for abdominal pain and nausea.  Genitourinary: Positive for dysuria (sometimes). Negative for flank pain, frequency, hematuria and vaginal discharge.       No urine odor  Musculoskeletal: Negative for back pain.       Objective:   Vitals:   04/14/18 1426  BP: 140/72  Pulse: 76  Resp: 16  Temp: 97.7 F (36.5 C)  SpO2: 99%   BP Readings from Last 3 Encounters:  04/14/18 140/72  03/16/18 (!) 142/80  12/01/17 132/64   Wt Readings from Last 3 Encounters:  04/14/18 210 lb (95.3 kg)  03/16/18 215 lb (97.5 kg)  12/01/17 218 lb 1.9 oz (98.9 kg)   Body mass index is 33.89 kg/m.   Physical Exam Constitutional:      General: She is not in acute distress.    Appearance: Normal appearance. She is not ill-appearing.  Abdominal:     General: There is no distension.     Palpations: Abdomen is soft.     Tenderness: There is no abdominal tenderness. There is no right CVA tenderness, left CVA tenderness, guarding or rebound.  Genitourinary:    Comments: deferred Skin:    General: Skin is warm and dry.  Neurological:     Mental Status: She is alert.            Assessment & Plan:    See Problem List for Assessment and Plan of chronic medical problems.

## 2018-04-14 ENCOUNTER — Ambulatory Visit (INDEPENDENT_AMBULATORY_CARE_PROVIDER_SITE_OTHER): Payer: Medicare HMO | Admitting: Internal Medicine

## 2018-04-14 ENCOUNTER — Encounter: Payer: Self-pay | Admitting: Internal Medicine

## 2018-04-14 ENCOUNTER — Other Ambulatory Visit: Payer: Medicare HMO

## 2018-04-14 VITALS — BP 140/72 | HR 76 | Temp 97.7°F | Resp 16 | Ht 66.0 in | Wt 210.0 lb

## 2018-04-14 DIAGNOSIS — L292 Pruritus vulvae: Secondary | ICD-10-CM | POA: Diagnosis not present

## 2018-04-14 DIAGNOSIS — R300 Dysuria: Secondary | ICD-10-CM

## 2018-04-14 LAB — COMPREHENSIVE METABOLIC PANEL
ALT: 21 IU/L (ref 0–32)
AST: 22 IU/L (ref 0–40)
Albumin/Globulin Ratio: 1.7 (ref 1.2–2.2)
Albumin: 4.1 g/dL (ref 3.7–4.7)
Alkaline Phosphatase: 137 IU/L — ABNORMAL HIGH (ref 39–117)
BUN/Creatinine Ratio: 19 (ref 12–28)
BUN: 19 mg/dL (ref 8–27)
Bilirubin Total: 0.7 mg/dL (ref 0.0–1.2)
CO2: 22 mmol/L (ref 20–29)
Calcium: 9.4 mg/dL (ref 8.7–10.3)
Chloride: 107 mmol/L — ABNORMAL HIGH (ref 96–106)
Creatinine, Ser: 1.02 mg/dL — ABNORMAL HIGH (ref 0.57–1.00)
GFR calc Af Amer: 61 mL/min/{1.73_m2} (ref >59)
GFR calc non Af Amer: 53 mL/min/{1.73_m2} — ABNORMAL LOW (ref >59)
Globulin, Total: 2.4 g/dL (ref 1.5–4.5)
Glucose: 102 mg/dL — ABNORMAL HIGH (ref 65–99)
Potassium: 4.3 mmol/L (ref 3.5–5.2)
Sodium: 141 mmol/L (ref 134–144)
Total Protein: 6.5 g/dL (ref 6.0–8.5)

## 2018-04-14 LAB — CBC
Hematocrit: 38.8 % (ref 34.0–46.6)
Hemoglobin: 12.5 g/dL (ref 11.1–15.9)
MCH: 30.3 pg (ref 26.6–33.0)
MCHC: 32.2 g/dL (ref 31.5–35.7)
MCV: 94 fL (ref 79–97)
Platelets: 278 10*3/uL (ref 150–450)
RBC: 4.13 x10E6/uL (ref 3.77–5.28)
RDW: 12 % (ref 11.7–15.4)
WBC: 9.1 10*3/uL (ref 3.4–10.8)

## 2018-04-14 LAB — POCT URINALYSIS DIPSTICK
Bilirubin, UA: NEGATIVE
Glucose, UA: NEGATIVE
Ketones, UA: NEGATIVE
Leukocytes, UA: NEGATIVE
Nitrite, UA: NEGATIVE
Protein, UA: NEGATIVE
Spec Grav, UA: >=1.03 — AB (ref 1.010–1.025)
Urobilinogen, UA: NEGATIVE E.U./dL — AB
pH, UA: 6 (ref 5.0–8.0)

## 2018-04-14 MED ORDER — MICONAZOLE NITRATE 2 % EX CREA: 1.0000 "application " | TOPICAL_CREAM | Freq: Two times a day (BID) | CUTANEOUS | 0 refills | Status: DC

## 2018-04-14 MED ORDER — MICONAZOLE NITRATE 200 MG VA SUPP: 200.0000 mg | Freq: Every day | VAGINAL | 0 refills | Status: DC

## 2018-04-14 NOTE — Assessment & Plan Note (Signed)
Itching and burning primarily on outside - vulvar region Will treat for possible yeast infection Miconazole vaginal supp and miconazole 2% cream If no improvement will see Gyn   Ua, Ucx

## 2018-04-14 NOTE — Assessment & Plan Note (Signed)
Recently treated for a UTI with macrobid  - no change in symptoms Recheck UA, UCx

## 2018-04-14 NOTE — Patient Instructions (Addendum)
We will call you with the urine results.    Two medications were sent to the pharmacy.  You should use the suppositories at night for 3 nights and apply the cream externally twice a day.    If your symptoms do not improve then you should see your gynecologist.

## 2018-04-15 LAB — URINE CULTURE
MICRO NUMBER:: 148132
SPECIMEN QUALITY:: ADEQUATE

## 2018-04-17 NOTE — Telephone Encounter (Signed)
Notes recorded by Kathleene Hazel, MD on 04/14/2018 at 1:48 PM EST Her labs are ok for her procedure. Can we let her know? Thanks, chris  I spoke with pt's son who is currently with pt and reviewed lab results with him.  Pt is scheduled for procedure on 2/13.  Son is asking if OK to hold Eliquis prior to procedure.  Per note below I told pt's son pt could hold Eliquis for 2 days prior to procedure.  I told him Eliquis should be resumed on the evening of the procedure or day after at discretion of procedure MD.  Son aware to discuss when to resume with procedure MD.

## 2018-04-22 ENCOUNTER — Other Ambulatory Visit: Payer: Self-pay | Admitting: Radiology

## 2018-04-23 ENCOUNTER — Other Ambulatory Visit (HOSPITAL_COMMUNITY): Payer: Self-pay | Admitting: Interventional Radiology

## 2018-04-23 ENCOUNTER — Other Ambulatory Visit: Payer: Self-pay

## 2018-04-23 ENCOUNTER — Ambulatory Visit (HOSPITAL_COMMUNITY)
Admission: RE | Admit: 2018-04-23 | Discharge: 2018-04-23 | Disposition: A | Discharge status: Home / Self Care | Payer: Medicare HMO | Source: Ambulatory Visit | Attending: Interventional Radiology | Admitting: Interventional Radiology

## 2018-04-23 DIAGNOSIS — Z823 Family history of stroke: Secondary | ICD-10-CM | POA: Diagnosis not present

## 2018-04-23 DIAGNOSIS — Z86718 Personal history of other venous thrombosis and embolism: Secondary | ICD-10-CM | POA: Diagnosis not present

## 2018-04-23 DIAGNOSIS — E785 Hyperlipidemia, unspecified: Secondary | ICD-10-CM | POA: Insufficient documentation

## 2018-04-23 DIAGNOSIS — E039 Hypothyroidism, unspecified: Secondary | ICD-10-CM | POA: Insufficient documentation

## 2018-04-23 DIAGNOSIS — Z7982 Long term (current) use of aspirin: Secondary | ICD-10-CM | POA: Diagnosis not present

## 2018-04-23 DIAGNOSIS — Z87891 Personal history of nicotine dependence: Secondary | ICD-10-CM | POA: Diagnosis not present

## 2018-04-23 DIAGNOSIS — Z955 Presence of coronary angioplasty implant and graft: Secondary | ICD-10-CM | POA: Diagnosis not present

## 2018-04-23 DIAGNOSIS — I10 Essential (primary) hypertension: Secondary | ICD-10-CM | POA: Diagnosis not present

## 2018-04-23 DIAGNOSIS — I252 Old myocardial infarction: Secondary | ICD-10-CM | POA: Insufficient documentation

## 2018-04-23 DIAGNOSIS — Z79899 Other long term (current) drug therapy: Secondary | ICD-10-CM | POA: Insufficient documentation

## 2018-04-23 DIAGNOSIS — Z90722 Acquired absence of ovaries, bilateral: Secondary | ICD-10-CM | POA: Insufficient documentation

## 2018-04-23 DIAGNOSIS — I6501 Occlusion and stenosis of right vertebral artery: Secondary | ICD-10-CM | POA: Diagnosis not present

## 2018-04-23 DIAGNOSIS — I6503 Occlusion and stenosis of bilateral vertebral arteries: Secondary | ICD-10-CM | POA: Diagnosis not present

## 2018-04-23 DIAGNOSIS — Z90710 Acquired absence of both cervix and uterus: Secondary | ICD-10-CM | POA: Diagnosis not present

## 2018-04-23 DIAGNOSIS — Z8673 Personal history of transient ischemic attack (TIA), and cerebral infarction without residual deficits: Secondary | ICD-10-CM | POA: Diagnosis not present

## 2018-04-23 DIAGNOSIS — I671 Cerebral aneurysm, nonruptured: Secondary | ICD-10-CM | POA: Insufficient documentation

## 2018-04-23 DIAGNOSIS — I2510 Atherosclerotic heart disease of native coronary artery without angina pectoris: Secondary | ICD-10-CM | POA: Diagnosis not present

## 2018-04-23 DIAGNOSIS — I729 Aneurysm of unspecified site: Secondary | ICD-10-CM

## 2018-04-23 DIAGNOSIS — Z888 Allergy status to other drugs, medicaments and biological substances status: Secondary | ICD-10-CM | POA: Insufficient documentation

## 2018-04-23 DIAGNOSIS — Z8249 Family history of ischemic heart disease and other diseases of the circulatory system: Secondary | ICD-10-CM | POA: Insufficient documentation

## 2018-04-23 DIAGNOSIS — I4811 Longstanding persistent atrial fibrillation: Secondary | ICD-10-CM | POA: Insufficient documentation

## 2018-04-23 DIAGNOSIS — I255 Ischemic cardiomyopathy: Secondary | ICD-10-CM | POA: Diagnosis not present

## 2018-04-23 HISTORY — PX: IR ANGIO VERTEBRAL SEL SUBCLAVIAN INNOMINATE BILAT MOD SED: IMG5366

## 2018-04-23 HISTORY — PX: IR US GUIDE VASC ACCESS RIGHT: IMG2390

## 2018-04-23 HISTORY — PX: IR ANGIO INTRA EXTRACRAN SEL COM CAROTID INNOMINATE BILAT MOD SED: IMG5360

## 2018-04-23 LAB — PROTIME-INR
INR: 1.1
Prothrombin Time: 14.1 seconds (ref 11.4–15.2)

## 2018-04-23 LAB — CBC
HCT: 41.7 % (ref 36.0–46.0)
Hemoglobin: 12.9 g/dL (ref 12.0–15.0)
MCH: 29.9 pg (ref 26.0–34.0)
MCHC: 30.9 g/dL (ref 30.0–36.0)
MCV: 96.8 fL (ref 80.0–100.0)
Platelets: 231 10*3/uL (ref 150–400)
RBC: 4.31 MIL/uL (ref 3.87–5.11)
RDW: 12.3 % (ref 11.5–15.5)
WBC: 10.9 10*3/uL — ABNORMAL HIGH (ref 4.0–10.5)
nRBC: 0 % (ref 0.0–0.2)

## 2018-04-23 LAB — BASIC METABOLIC PANEL
Anion gap: 11 (ref 5–15)
BUN: 15 mg/dL (ref 8–23)
CO2: 22 mmol/L (ref 22–32)
Calcium: 9.1 mg/dL (ref 8.9–10.3)
Chloride: 108 mmol/L (ref 98–111)
Creatinine, Ser: 1.01 mg/dL — ABNORMAL HIGH (ref 0.44–1.00)
GFR calc Af Amer: >60 mL/min (ref >60)
GFR calc non Af Amer: 54 mL/min — ABNORMAL LOW (ref >60)
Glucose, Bld: 102 mg/dL — ABNORMAL HIGH (ref 70–99)
Potassium: 3.4 mmol/L — ABNORMAL LOW (ref 3.5–5.1)
Sodium: 141 mmol/L (ref 135–145)

## 2018-04-23 MED ORDER — FENTANYL CITRATE (PF) 100 MCG/2ML IJ SOLN
INTRAMUSCULAR | Status: AC | PRN
  Administered 2018-04-23: 25 ug via INTRAVENOUS

## 2018-04-23 MED ORDER — MIDAZOLAM HCL 2 MG/2ML IJ SOLN
INTRAMUSCULAR | Status: AC
  Filled 2018-04-23: qty 2

## 2018-04-23 MED ORDER — HEPARIN SODIUM (PORCINE) 1000 UNIT/ML IJ SOLN
INTRAMUSCULAR | Status: AC | PRN
  Administered 2018-04-23: 2000 [IU] via INTRAVENOUS

## 2018-04-23 MED ORDER — HEPARIN SODIUM (PORCINE) 1000 UNIT/ML IJ SOLN
INTRAMUSCULAR | Status: AC
  Filled 2018-04-23: qty 2

## 2018-04-23 MED ORDER — VERAPAMIL HCL 2.5 MG/ML IV SOLN
INTRAVENOUS | Status: AC | PRN
  Administered 2018-04-23: 2.5 mg via INTRAVENOUS

## 2018-04-23 MED ORDER — NITROGLYCERIN 1 MG/10 ML FOR IR/CATH LAB
INTRA_ARTERIAL | Status: AC | PRN
  Administered 2018-04-23 (×2): 200 ug via INTRA_ARTERIAL

## 2018-04-23 MED ORDER — SODIUM CHLORIDE 0.9 % IV SOLN: INTRAVENOUS | Status: AC

## 2018-04-23 MED ORDER — VERAPAMIL HCL 2.5 MG/ML IV SOLN
INTRAVENOUS | Status: AC
  Filled 2018-04-23: qty 2

## 2018-04-23 MED ORDER — LIDOCAINE HCL 1 % IJ SOLN
INTRAMUSCULAR | Status: AC
  Filled 2018-04-23: qty 20

## 2018-04-23 MED ORDER — NITROGLYCERIN 1 MG/10 ML FOR IR/CATH LAB
INTRA_ARTERIAL | Status: AC
  Filled 2018-04-23: qty 10

## 2018-04-23 MED ORDER — FENTANYL CITRATE (PF) 100 MCG/2ML IJ SOLN
INTRAMUSCULAR | Status: AC
  Filled 2018-04-23: qty 2

## 2018-04-23 MED ORDER — SODIUM CHLORIDE 0.9 % IV SOLN
Freq: Once | INTRAVENOUS | Status: AC
  Administered 2018-04-23: 10:00:00 via INTRAVENOUS

## 2018-04-23 MED ORDER — MIDAZOLAM HCL 2 MG/2ML IJ SOLN
INTRAMUSCULAR | Status: AC | PRN
  Administered 2018-04-23: 1 mg via INTRAVENOUS

## 2018-04-23 NOTE — Discharge Instructions (Addendum)
Radial Site Care ° °This sheet gives you information about how to care for yourself after your procedure. Your health care provider may also give you more specific instructions. If you have problems or questions, contact your health care provider. °What can I expect after the procedure? °After the procedure, it is common to have: °· Bruising and tenderness at the catheter insertion area. °Follow these instructions at home: °Medicines °· Take over-the-counter and prescription medicines only as told by your health care provider. °Insertion site care °· Follow instructions from your health care provider about how to take care of your insertion site. Make sure you: °? Wash your hands with soap and water before you change your bandage (dressing). If soap and water are not available, use hand sanitizer. °? Change your dressing as told by your health care provider. °? Leave stitches (sutures), skin glue, or adhesive strips in place. These skin closures may need to stay in place for 2 weeks or longer. If adhesive strip edges start to loosen and curl up, you may trim the loose edges. Do not remove adhesive strips completely unless your health care provider tells you to do that. °· Check your insertion site every day for signs of infection. Check for: °? Redness, swelling, or pain. °? Fluid or blood. °? Pus or a bad smell. °? Warmth. °· Do not take baths, swim, or use a hot tub until your health care provider approves. °· You may shower 24-48 hours after the procedure, or as directed by your health care provider. °? Remove the dressing and gently wash the site with plain soap and water. °? Pat the area dry with a clean towel. °? Do not rub the site. That could cause bleeding. °· Do not apply powder or lotion to the site. °Activity ° °· For 24 hours after the procedure, or as directed by your health care provider: °? Do not flex or bend the affected arm. °? Do not push or pull heavy objects with the affected arm. °? Do not  drive yourself home from the hospital or clinic. You may drive 24 hours after the procedure unless your health care provider tells you not to. °? Do not operate machinery or power tools. °· Do not lift anything that is heavier than 10 lb (4.5 kg), or the limit that you are told, until your health care provider says that it is safe. °· Ask your health care provider when it is okay to: °? Return to work or school. °? Resume usual physical activities or sports. °? Resume sexual activity. °General instructions °· If the catheter site starts to bleed, raise your arm and put firm pressure on the site. If the bleeding does not stop, get help right away. This is a medical emergency. °· If you went home on the same day as your procedure, a responsible adult should be with you for the first 24 hours after you arrive home. °· Keep all follow-up visits as told by your health care provider. This is important. °Contact a health care provider if: °· You have a fever. °· You have redness, swelling, or yellow drainage around your insertion site. °Get help right away if: °· You have unusual pain at the radial site. °· The catheter insertion area swells very fast. °· The insertion area is bleeding, and the bleeding does not stop when you hold steady pressure on the area. °· Your arm or hand becomes pale, cool, tingly, or numb. °These symptoms may represent a serious problem   that is an emergency. Do not wait to see if the symptoms will go away. Get medical help right away. Call your local emergency services (911 in the [place] Do not drive yourself to the hospital. °Summary °· After the procedure, it is common to have bruising and tenderness at the site. °· Follow instructions from your health care provider about how to take care of your radial site wound. Check the wound every day for signs of infection. °· Do not lift anything that is heavier than 10 lb (4.5 kg), or the limit that you are told, until your health care provider says  that it is safe. °This information is not intended to replace advice given to you by your health care provider. Make sure you discuss any questions you have with your health care provider. °Document Released: 03/30/2010 Document Revised: 04/02/2017 Document Reviewed: 04/02/2017 °Elsevier Interactive Patient Education © 2019 [employer] Inc[employer] °Cerebral Angiogram ° °A cerebral angiogram is a procedure that is used to examine the blood vessels in the brain and neck. In this procedure, contrast dye is injected through a long, thin tube (catheter) into an artery. X-rays are then taken, which show if there is a blockage or problem in a blood vessel. °Tell a health care provider about: °· Any allergies you have, including allergies to shellfish, contrast dye, or iodine °· All medicines you are taking, including vitamins, herbs, eye drops, creams, and over-the-counter medicines. °· Any problems you or family members have had with anesthetic medicines. °· Any blood disorders you have. °· Any surgeries you have had. °· Any medical conditions you have. °· Whether you are pregnant or may be pregnant. °· Whether you are currently breastfeeding. °What are the risks? °Generally, this is a safe procedure. However, problems may occur, including: °· Damage to surrounding nerves, tissues, or structures. °· Blood clot. °· Inability to remember what happened (amnesia). This is usually temporary. °· Weakness, numbness, speech, or vision problems. This is usually temporary. °· Stroke. °· Kidney injury. °· Bleeding or bruising. °· Allergic reaction medicines or dyes. °· Infection. °What happens before the procedure? °Staying hydrated °Follow instructions from your health care provider about hydration, which may include: °· Up to 2 hours before the procedure - you may continue to drink clear liquids, such as water, clear fruit juice, black coffee, and plain tea. °Eating and drinking restrictions °Follow instructions from your health care  provider about eating and drinking, which may include: °· 8 hours before the procedure - stop eating heavy meals or foods such as meat, fried foods, or fatty foods. °· 6 hours before the procedure - stop eating light meals or foods, such as toast or cereal. °· 6 hours before the procedure - stop drinking milk or drinks that contain milk. °· 2 hours before the procedure - stop drinking clear liquids. °General instructions °· Ask your health care provider about: °? Changing or stopping your regular medicines. This is especially important if you are taking diabetes medicines or blood thinners. °? Taking medicines such as aspirin or ibuprofen. These medicines can thin your blood. Do not take these medicines before your procedure if your health care provider asks you not to. °· You may have blood tests done. °· Plan to have someone take you home from the hospital or clinic. °· If you will be going home right after the procedure, plan to have someone with you for 24 hours. °What happens during the procedure? °· To reduce your risk of infection: °? Your health   care team will wash or sanitize their hands. °? Your skin will be washed with soap. °? Hair may be removed from the surgical area. °· You will lie on your back on an imaging bed with an X-ray machine around you. °· Your head will be secured to the bed with a strap or device to help you keep still. °· An IV tube will be inserted into one of your veins. °· You will be given one or more of the following: °? A medicine to help you relax (sedative). °? A medicine to numb the area (local anesthetic) where the catheter will be inserted. This is usually your groin, leg, or arm. °· Your heart rate and other vital signs will be watched carefully. You may have electrodes placed on your chest. °· A small cut (incision) will be made where the catheter will be inserted. °· The catheter will be inserted into an artery that leads to the head. You may feel slight pressure. °· The  catheter will be moved through the body up to your neck and brain. X-ray images will help your health care provider bring the catheter to the correct location. °· The dye will be injected into the catheter and will travel to your head or neck area. You may feel a warming or burning sensation or notice a strange taste in your mouth as the dye goes through your system. °· Images will be taken to show how the dye flows through the area. °· While the images are being taken, you may be given instructions on breathing, swallowing, moving, or talking. °· When the images are finished, the catheter will be slowly removed. °· Pressure will be applied to the skin to stop any bleeding. A tight bandage (dressing) or seal will be applied to the skin. °· Your IV will be removed. °The procedure may vary among health care providers and hospitals. °What happens after the procedure? °· Your blood pressure, heart rate, breathing rate, and blood oxygen level will be monitored until the medicines you were given have worn off. °· You will be asked to lie flat for several hours. The arm or leg where the catheter was inserted will need to be kept straight while you are in the recovery room. °· The insertion site will be watched for bleeding and you will be checked often. °· You will be instructed to drink plenty of fluids. This will help wash the contrast dye out of your system. °· Do not drive for 24 hours if you received a sedative. °· It is up to you to get the results of your procedure. Ask your health care provider, or the department that is doing the procedure, when your results will be ready. °Summary °· A cerebral angiogram is a procedure that is used to examine the blood vessels in the brain and neck. °· In this procedure, contrast dye is injected through a long, thin tube (catheter) into an artery. X-rays are then taken, which show if there is a blockage or problem in a blood vessel. °· You will be given a sedative to help you  relax during the procedure. A local anesthetic will be used to numb the area where the catheter is inserted. You may feel pressure when the catheter is inserted, and you may feel a warm sensation when the dye is injected. °· After the procedure, you will be asked to lie flat for several hours. The arm or leg where the catheter was inserted will need to be kept   straight while you are in the recovery room. °This information is not intended to replace advice given to you by your health care provider. Make sure you discuss any questions you have with your health care provider. °Document Released: 07/12/2013 Document Revised: 04/01/2016 Document Reviewed: 04/01/2016 °Elsevier Interactive Patient Education © 2019 [employer] Inc[employer] °Moderate Conscious Sedation, Adult, Care After °These instructions provide you with information about caring for yourself after your procedure. Your health care provider may also give you more specific instructions. Your treatment has been planned according to current medical practices, but problems sometimes occur. Call your health care provider if you have any problems or questions after your procedure. °What can I expect after the procedure? °After your procedure, it is common: °· To feel sleepy for several hours. °· To feel clumsy and have poor balance for several hours. °· To have poor judgment for several hours. °· To vomit if you eat too soon. °Follow these instructions at home: °For at least 24 hours after the procedure: ° °· Do not: °? Participate in activities where you could fall or become injured. °? Drive. °? Use heavy machinery. °? Drink alcohol. °? Take sleeping pills or medicines that cause drowsiness. °? Make important decisions or sign legal documents. °? Take care of children on your own. °· Rest. °Eating and drinking °· Follow the diet recommended by your health care provider. °· If you vomit: °? Drink water, juice, or soup when you can drink without vomiting. °? Make sure you  have little or no nausea before eating solid foods. °General instructions °· Have a responsible adult stay with you until you are awake and alert. °· Take over-the-counter and prescription medicines only as told by your health care provider. °· If you smoke, do not smoke without supervision. °· Keep all follow-up visits as told by your health care provider. This is important. °Contact a health care provider if: °· You keep feeling nauseous or you keep vomiting. °· You feel light-headed. °· You develop a rash. °· You have a fever. °Get help right away if: °· You have trouble breathing. °This information is not intended to replace advice given to you by your health care provider. Make sure you discuss any questions you have with your health care provider. °Document Released: 12/16/2012 Document Revised: 07/31/2015 Document Reviewed: 06/17/2015 °Elsevier Interactive Patient Education © 2019 [employer] Inc. ° °

## 2018-04-23 NOTE — Progress Notes (Signed)
Spoke with Dr. Corliss Skains to see when patient should start eliquis.  Dr. Corliss Skains stated she should start it back tomorrow Friday 04/24/18.

## 2018-04-23 NOTE — Sedation Documentation (Signed)
Air released from TR band to 11 by Dr. Corliss Skains

## 2018-04-23 NOTE — Procedures (Signed)
S/P 4 vessel cerebral arteriogram RT radial approach. Findings. 1.Occluded RT VA distal RT PICA. 2. 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 Mild fusiform dilatation of Lt ICA caval seg

## 2018-04-23 NOTE — H&P (Signed)
Chief Complaint: Patient was seen in consultation today for cerebral angiogram at the request of [place]  Referring Physician(s): Dr. Leonie Man  Supervising Physician: Luanne Bras  Patient Status: [facility] - Out-pt  History of Present Illness: Yvonne Bailey is a 78 y.o. female with hx of cerebrovascular disease. She has known hx of intracranial aneurysms as well as vertebrobasilar stenosis/occlusive disease. She is scheduled for follow up cerebral angiogram today. PMHx, meds, labs, imaging, allergies reviewed. She has held her Eliquis for the past 2 days as directed. Feels well, no recent fevers, chills, illness. Has been NPO today as directed.   Past Medical History:  Diagnosis Date  . CAD (coronary artery disease)    a. anterior MI s/p PCI in 1992. b. cath 08/2015 with severe three-vessel CAD turned down for CABG and underwent DESx5 to prox Cx/OM2/D1/oRCA/mRCA  . Carotid artery disease ([place])    a. carotid duplex 03/2015 showed 1-39% BICA, normal subclavian arteries, chronically occluded left vertebral, f/u recommended only PRN.  . DVT (deep venous thrombosis) ([place])    X1  . History of nuclear stress test    a. Myoview 6/17: EF 20-25%, mid anteroseptal, apical anterior, apical septal, apical inferior, apical lateral and apical scar, no ischemia, intermediate risk  . HTN (hypertension)   . Hyperlipidemia   . Hypokalemia   . Hypothyroidism   . Ischemic cardiomyopathy    a. Echo 6/17: EF 20-25%, apex appears akinetic, MAC, moderate MR, moderate LAE, mild RVE, trivial PI, PASP 47 mmHg (needs repeat with Definity contrast).  b. Limited echo with Definity contrast 7/17: EF 25-30%, moderate to severe LAE. c. Limited Echo 2018 showed EF 40-45%.  . Longstanding persistent atrial fibrillation   . MI (myocardial infarction) ([place]) 1992  . PAD (peripheral artery disease) (HCC)    Right SFA occlusion, severe disease left CFA and SFA  . Stroke [facility])    a. 02/2017 in  setting of noncompliance with Eliquis  . TIA (transient ischemic attack)   . Tricuspid regurgitation     Past Surgical History:  Procedure Laterality Date  . APPENDECTOMY     at hysterectomy and USO for fibroids, Dr. Ysidro Evert  . CARDIAC CATHETERIZATION  1992   Dr Eustace Quail  . CARDIAC CATHETERIZATION N/A 09/06/2015   Procedure: Right/Left Heart Cath and Coronary Angiography;  Surgeon: Burnell Blanks, MD;  Location: [place] CV LAB;  Service: Cardiovascular;  Laterality: N/A;  . CARDIAC CATHETERIZATION N/A 09/07/2015   Procedure: Coronary Stent Intervention;  Surgeon: Burnell Blanks, MD;  Location: [place] CV LAB;  Service: Cardiovascular;  Laterality: N/A;  . COLONOSCOPY     negative; 2008, Dr. Delfin Edis  . fracture LLE     '94; pinned  . IR ANGIO INTRA EXTRACRAN SEL COM CAROTID INNOMINATE BILAT MOD SED  03/02/2017  . IR ANGIO VERTEBRAL SEL VERTEBRAL UNI R MOD SED  03/02/2017  . IR RADIOLOGIST EVAL & MGMT  04/28/2017  . RADIOLOGY WITH ANESTHESIA N/A 03/02/2017   Procedure: RADIOLOGY WITH ANESTHESIA;  Surgeon: Luanne Bras, MD;  Location: [place];  Service: Radiology;  Laterality: N/A;  . TONSILLECTOMY    . TOTAL ABDOMINAL HYSTERECTOMY     & BSO for fibroids    Allergies: Definity [perflutren lipid microsphere] and Cilostazol  Medications: Prior to Admission medications   Medication Sig Start Date End Date Taking? Authorizing Provider  amLODipine (NORVASC) 2.5 MG tablet Take 1 tablet (2.5 mg total) by mouth daily. 03/31/17  Yes Binnie Rail, MD  aspirin EC 81 MG tablet Take 1 tablet (81 mg total) by mouth daily. 04/21/17  Yes Imogene Burn, PA-C  atorvastatin (LIPITOR) 40 MG tablet Take 1 tablet (40 mg total) by mouth daily. -- Office visit needed for further refills 02/13/18  Yes Burns, Claudina Lick, MD  benazepril (LOTENSIN) 20 MG tablet Take 0.5 tablets (10 mg total) by mouth daily. -- Office visit needed for further refills 03/16/18  Yes Burns, Claudina Lick,  MD  brimonidine (ALPHAGAN) 0.2 % ophthalmic solution Place 1 drop into both eyes 3 (three) times daily.  12/24/17  Yes [provider]  dorzolamide-timolol (COSOPT) 22.3-6.8 MG/ML ophthalmic solution Place 1 drop into both eyes 2 (two) times daily.    Yes [provider]  furosemide (LASIX) 40 MG tablet Take one tablet by mouth daily as needed for swelling/weight gain 12/01/17  Yes Burnell Blanks, MD  levothyroxine (SYNTHROID, LEVOTHROID) 112 MCG tablet Take 1 tablet (112 mcg total) by mouth daily. -- Office visit needed for further refills 02/24/18  Yes Burns, Claudina Lick, MD  metoprolol succinate (TOPROL-XL) 50 MG 24 hr tablet TAKE 1 TABLET BY MOUTH TWICE DAILY WITH OR IMMEDIATELY FOLLOWING A MEAL Patient taking differently: Take 50 mg by mouth daily.  05/02/17  Yes Burnell Blanks, MD  miconazole (MICATIN) 2 % cream Apply 1 application topically 2 (two) times daily. 04/14/18  Yes Burns, Claudina Lick, MD  miconazole (MICOTIN) 200 MG vaginal suppository Place 1 suppository (200 mg total) vaginally at bedtime. 04/14/18  Yes Burns, Claudina Lick, MD  travoprost, benzalkonium, (TRAVATAN) 0.004 % ophthalmic solution Place 1 drop into both eyes at bedtime.   Yes [provider]  apixaban (ELIQUIS) 5 MG TABS tablet  06/24/17   [provider]  nitroGLYCERIN (NITROSTAT) 0.4 MG SL tablet Place 1 tablet (0.4 mg total) under the tongue every 5 (five) minutes as needed for chest pain. 09/08/15   Arbutus Leas, NP  spironolactone (ALDACTONE) 25 MG tablet Take 1 tablet (25 mg total) by mouth daily. -- Office visit needed for further refills 02/24/18   Binnie Rail, MD     Family History  Problem Relation Age of Onset  . Diabetes Mother   . Hypertension Mother   . Stroke Brother        ?> 55  . Coronary artery disease Brother        stent in 70s  . Cancer Neg Hx     Social History   Socioeconomic History  . Marital status: Married    Spouse name: [name]  . Number of  children: 1  . Years of education: college  . Highest education level: Not on file  Occupational History  . Occupation: Pharmacist, hospital    Comment: Retired  Scientific laboratory technician  . Financial resource strain: Not on file  . Food insecurity:    Worry: Not on file    Inability: Not on file  . Transportation needs:    Medical: Not on file    Non-medical: Not on file  Tobacco Use  . Smoking status: Former Smoker    Last attempt to quit: 03/11/1990    Years since quitting: 28.1  . Smokeless tobacco: Never Used  . Tobacco comment: smoked [place], up to < 5 cigarettes  Substance and Sexual Activity  . Alcohol use: No  . Drug use: No  . Sexual activity: Not on file  Lifestyle  . Physical activity:    Days per week: Not on file  Minutes per session: Not on file  . Stress: Not on file  Relationships  . Social connections:    Talks on phone: Not on file    Gets together: Not on file    Attends religious service: Not on file    Active member of club or organization: Not on file    Attends meetings of clubs or organizations: Not on file    Relationship status: Not on file  Other Topics Concern  . 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 08/31/09- designated party form signed appointing, husband Kerston Landeck; ok to leave msg on home phone [phone].      Caffeine Small Amount one cup very rare.   Right handed.   One child- [name]     Review of Systems: A 12 point ROS discussed and pertinent positives are indicated in the HPI above.  All other systems are negative.  Review of Systems  Vital Signs: BP (!) 177/93   Pulse 69   Temp 97.6 F (36.4 C)   Ht _0  (1.676 m)   Wt 98.9 kg   BMI 35.19 kg/m   Physical Exam Constitutional:      Appearance: Normal appearance.  HENT:     Head: Normocephalic and atraumatic.     Mouth/Throat:     Mouth: Mucous membranes are moist.     Pharynx: Oropharynx is clear.    Cardiovascular:     Rate and Rhythm: Normal rate and regular rhythm.     Pulses: Normal pulses.     Heart sounds: Normal heart sounds.  Pulmonary:     Effort: Pulmonary effort is normal. No respiratory distress.     Breath sounds: Normal breath sounds.  Skin:    General: Skin is warm and dry.  Neurological:     General: No focal deficit present.     Mental Status: She is alert and oriented to person, place, and time.  Psychiatric:        Mood and Affect: Mood normal.        Judgment: Judgment normal.       Imaging: No results found.  Labs:  CBC: Recent Labs    04/13/18 0000  WBC 9.1  HGB 12.5  HCT 38.8  PLT 278    COAGS: No results for input(s): INR, APTT in the last 8760 hours.  BMP: Recent Labs    04/13/18 0000  NA 141  K 4.3  CL 107*  CO2 22  GLUCOSE 102*  BUN 19  CALCIUM 9.4  CREATININE 1.02*  GFRNONAA 53*  GFRAA 61    LIVER FUNCTION TESTS: Recent Labs    06/03/17 0921 04/13/18 0000  BILITOT 0.7 0.7  AST 20 22  ALT 18 21  ALKPHOS 130* 137*  PROT 6.8 6.5  ALBUMIN 4.0 4.1    TUMOR MARKERS: No results for input(s): AFPTM, CEA, CA199, CHROMGRNA in the last 8760 hours.  Assessment and Plan: Vertebrobasilar stenosis/occlusive disease and aneurysms For angiogram today Labs ok Risks and benefits of cerebral angiogram were discussed with the patient including, but not limited to bleeding, infection, vascular injury or contrast induced renal failure.  This interventional procedure involves the use of X-rays and because of the nature of the planned procedure, it is possible that we will have prolonged use of X-ray fluoroscopy.  Potential radiation risks to you include (but are not limited to) the following: - A slightly elevated risk  for cancer  several years later in life. This risk is typically less than 0.5% percent. This risk is low in comparison to the normal incidence of human cancer, which is 33% for women and 50% for men according to  the [place]. - Radiation induced injury can include skin redness, resembling a rash, tissue breakdown / ulcers and hair loss (which can be temporary or permanent).   The likelihood of either of these occurring depends on the difficulty of the procedure and whether you are sensitive to radiation due to previous procedures, disease, or genetic conditions.   IF your procedure requires a prolonged use of radiation, you will be notified and given written instructions for further action.  It is your responsibility to monitor the irradiated area for the 2 weeks following the procedure and to notify your physician if you are concerned that you have suffered a radiation induced injury.    All of the patient's questions were answered, patient is agreeable to proceed.  Consent signed and in chart.   Thank you for this interesting consult.  I greatly enjoyed meeting Yvonne Bailey and look forward to participating in their care.  A copy of this report was sent to the requesting provider on this date.  Electronically Signed: Ascencion Dike, PA-C 04/23/2018, 9:41 AM   I spent a total of 20 minutes in face to face in clinical consultation, greater than 50% of which was counseling/coordinating care for cerebral angiogram

## 2018-04-24 ENCOUNTER — Encounter (HOSPITAL_COMMUNITY): Payer: Self-pay | Admitting: Interventional Radiology

## 2018-04-28 ENCOUNTER — Other Ambulatory Visit: Payer: Self-pay | Admitting: Cardiovascular Disease

## 2018-04-30 ENCOUNTER — Other Ambulatory Visit: Payer: Self-pay | Admitting: Internal Medicine

## 2018-05-07 ENCOUNTER — Telehealth: Payer: Self-pay | Admitting: Internal Medicine

## 2018-05-07 NOTE — Telephone Encounter (Signed)
Patient is calling back-When she was in the hospital she was advised to take a tablet and a half of Lotensin. This caused her to run out of medication early.  At this time, the patient is needing a medication refill. But wants to know the correct dosage of her Lotensin. Please advise. Thank you

## 2018-05-07 NOTE — Telephone Encounter (Signed)
Copied from CRM [id number]. Topic: General - Other >> May 07, 2018  3:29 PM Tamela Oddi wrote: Reason for CRM: Patient called to speak with the nurse or doctor regarding her medication, benazepril (LOTENSIN) 20 MG tablet.  Please call patient back to verify the dosage.  CB# [phone]

## 2018-05-08 MED ORDER — BENAZEPRIL HCL 20 MG PO TABS: ORAL_TABLET | ORAL | 1 refills | Status: DC

## 2018-05-08 NOTE — Telephone Encounter (Signed)
Continue 1.5 tabs daily.

## 2018-05-08 NOTE — Telephone Encounter (Signed)
Pt aware rx sent in with changed directions.

## 2018-05-14 ENCOUNTER — Other Ambulatory Visit: Payer: Self-pay | Admitting: Cardiovascular Disease

## 2018-05-14 DIAGNOSIS — I10 Essential (primary) hypertension: Secondary | ICD-10-CM

## 2018-05-14 DIAGNOSIS — I482 Chronic atrial fibrillation, unspecified: Secondary | ICD-10-CM

## 2018-05-20 ENCOUNTER — Other Ambulatory Visit: Payer: Self-pay

## 2018-05-20 ENCOUNTER — Encounter: Payer: Self-pay | Admitting: Obstetrics and Gynecology

## 2018-05-20 ENCOUNTER — Ambulatory Visit: Payer: Medicare HMO | Admitting: Obstetrics and Gynecology

## 2018-05-20 ENCOUNTER — Encounter: Payer: Medicare HMO | Admitting: Obstetrics and Gynecology

## 2018-05-20 VITALS — BP 154/59 | HR 68 | Ht 67.0 in | Wt 205.4 lb

## 2018-05-20 DIAGNOSIS — L280 Lichen simplex chronicus: Secondary | ICD-10-CM

## 2018-05-20 MED ORDER — CLOBETASOL PROPIONATE 0.05 % EX OINT: 1.0000 "application " | TOPICAL_OINTMENT | Freq: Every day | CUTANEOUS | 1 refills | Status: AC

## 2018-05-20 NOTE — Progress Notes (Signed)
Obstetrics and Gynecology New Patient Evaluation  Appointment Date: 05/20/2018  OBGYN Clinic: [facility] for [facility]  Primary Care Provider: Binnie Rail  Referring Provider: Binnie Rail, MD  Chief Complaint: vaginal irritation History of Present Illness: Yvonne Bailey is a 78 y.o. African-American [id number] (No LMP recorded. Patient has had a hysterectomy.), seen for the above chief complaint.   Patient seen by PCP in early February with vaginal itching and burning and prescribed miconazole cream and supp. Pt states s/s improved some some so referred to GYN for further evaluation. S/s having been ongoing for several months but < a year and no h/o prior s/s. No VB, spotting, vaginal discharge, dysuria or pain   Review of Systems: as noted in the History of Present Illness.  Patient Active Problem List   Diagnosis Date Noted  . Dysuria 04/14/2018  . Vulvar itching 04/14/2018  . Leukocytosis   . Aphasia as late effect of cerebrovascular accident (CVA)   . Acute ischemic cerebrovascular accident (CVA) involving left middle cerebral artery territory [facility])   . Expressive aphasia   . Coronary artery disease involving native coronary artery without angina pectoris   . Stage 3 chronic kidney disease ([place])   . Stroke (cerebrum) ([place]) 03/02/2017  . Prediabetes 07/28/2016  . DOE (dyspnea on exertion) 07/09/2016  . Gait instability 07/09/2016  . Itching 04/16/2016  . Unstable angina ([place]) 09/06/2015  . Cardiomyopathy, ischemic   . Chronic systolic heart failure ([place]) 08/21/2015  . Bilateral leg edema 07/19/2015  . Atrial fibrillation ([place]) 07/19/2015  . Urinary urgency 03/30/2015  . Other musculoskeletal symptoms referable to limbs(729.89) 12/17/2012  . Lacunar infarction ([place]) 12/17/2012  . Small vessel disease, cerebrovascular 12/17/2012  . CAROTID BRUIT, RIGHT 08/10/2008  . Peripheral vascular disease ([place]) 06/04/2007  . Diverticulosis of large intestine  06/04/2007  . Hypothyroidism 02/24/2007  . Hyperlipidemia 02/24/2007  . Essential hypertension 02/24/2007  . Acute thromboembolism of deep veins of lower extremity ([place]) 01/21/2007  . Coronary atherosclerosis 10/29/2006     Past Medical History:  Past Medical History:  Diagnosis Date  . CAD (coronary artery disease)    a. anterior MI s/p PCI in 1992. b. cath 08/2015 with severe three-vessel CAD turned down for CABG and underwent DESx5 to prox Cx/OM2/D1/oRCA/mRCA  . Carotid artery disease ([place])    a. carotid duplex 03/2015 showed 1-39% BICA, normal subclavian arteries, chronically occluded left vertebral, f/u recommended only PRN.  . DVT (deep venous thrombosis) ([place])    X1  . History of nuclear stress test    a. Myoview 6/17: EF 20-25%, mid anteroseptal, apical anterior, apical septal, apical inferior, apical lateral and apical scar, no ischemia, intermediate risk  . HTN (hypertension)   . Hyperlipidemia   . Hypokalemia   . Hypothyroidism   . Ischemic cardiomyopathy    a. Echo 6/17: EF 20-25%, apex appears akinetic, MAC, moderate MR, moderate LAE, mild RVE, trivial PI, PASP 47 mmHg (needs repeat with Definity contrast).  b. Limited echo with Definity contrast 7/17: EF 25-30%, moderate to severe LAE. c. Limited Echo 2018 showed EF 40-45%.  . Longstanding persistent atrial fibrillation   . MI (myocardial infarction) ([place]) 1992  . PAD (peripheral artery disease) (HCC)    Right SFA occlusion, severe disease left CFA and SFA  . Stroke [facility])    a. 02/2017 in setting of noncompliance with Eliquis  . TIA (transient ischemic attack)   . Tricuspid regurgitation     Past Surgical History:  Past Surgical  History:  Procedure Laterality Date  . APPENDECTOMY     at hysterectomy and USO for fibroids, Dr. Ysidro Evert  . CARDIAC CATHETERIZATION  1992   Dr Eustace Quail  . CARDIAC CATHETERIZATION N/A 09/06/2015   Procedure: Right/Left Heart Cath and Coronary Angiography;  Surgeon: Burnell Blanks, MD;  Location: [place] CV LAB;  Service: Cardiovascular;  Laterality: N/A;  . CARDIAC CATHETERIZATION N/A 09/07/2015   Procedure: Coronary Stent Intervention;  Surgeon: Burnell Blanks, MD;  Location: [place] CV LAB;  Service: Cardiovascular;  Laterality: N/A;  . COLONOSCOPY     negative; 2008, Dr. Delfin Edis  . fracture LLE     '94; pinned  . IR ANGIO INTRA EXTRACRAN SEL COM CAROTID INNOMINATE BILAT MOD SED  03/02/2017  . IR ANGIO INTRA EXTRACRAN SEL COM CAROTID INNOMINATE BILAT MOD SED  04/23/2018  . IR ANGIO VERTEBRAL SEL SUBCLAVIAN INNOMINATE BILAT MOD SED  04/23/2018  . IR ANGIO VERTEBRAL SEL VERTEBRAL UNI R MOD SED  03/02/2017  . IR RADIOLOGIST EVAL & MGMT  04/28/2017  . IR US GUIDE VASC ACCESS RIGHT  04/23/2018  . RADIOLOGY WITH ANESTHESIA N/A 03/02/2017   Procedure: RADIOLOGY WITH ANESTHESIA;  Surgeon: Luanne Bras, MD;  Location: [place];  Service: Radiology;  Laterality: N/A;  . TONSILLECTOMY    . TOTAL ABDOMINAL HYSTERECTOMY     & BSO for fibroids    Past Obstetrical History:  OB History  Gravida Para Term Preterm AB Living  _0 SAB TAB Ectopic Multiple Live Births  3       1    # Outcome Date GA Lbr Len/2nd Weight Sex Delivery Anes PTL Lv  4 Term 09/25/64    M Vag-Spont   LIV  3 SAB           2 SAB           1 SAB             Past Gynecological History: As per HPI. History of Pap Smear(s): Yes.   Last pap unsure but all normal History of HRT use: No.  Social History:  Social History   Socioeconomic History  . Marital status: Married    Spouse name: [name]  . Number of children: 1  . Years of education: college  . Highest education level: Not on file  Occupational History  . Occupation: Pharmacist, hospital    Comment: Retired  Scientific laboratory technician  . Financial resource strain: Not on file  . Food insecurity:    Worry: Never true    Inability: Never true  . Transportation needs:    Medical: No    Non-medical: No  Tobacco Use  . Smoking  status: Former Smoker    Last attempt to quit: 03/11/1990    Years since quitting: 28.2  . Smokeless tobacco: Never Used  . Tobacco comment: smoked [place], up to < 5 cigarettes  Substance and Sexual Activity  . Alcohol use: No  . Drug use: No  . Sexual activity: Not Currently    Birth control/protection: Surgical  Lifestyle  . Physical activity:    Days per week: Not on file    Minutes per session: Not on file  . Stress: Not on file  Relationships  . Social connections:    Talks on phone: Not on file    Gets together: Not on file    Attends religious service: Not on file    Active member of  club or organization: Not on file    Attends meetings of clubs or organizations: Not on file    Relationship status: Not on file  . Intimate partner violence:    Fear of current or ex partner: Not on file    Emotionally abused: Not on file    Physically abused: Not on file    Forced sexual activity: Not on file  Other Topics Concern  . 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 08/31/09- designated party form signed appointing, husband Carlean Crowl; ok to leave msg on home phone [phone].      Caffeine Small Amount one cup very rare.   Right handed.   One child- [name]    Family History:  Family History  Problem Relation Age of Onset  . Diabetes Mother   . Hypertension Mother   . Stroke Brother        ?> 55  . Coronary artery disease Brother        stent in 67s  . Cancer Neg Hx     Medications [name] had no medications administered during this visit. Current Outpatient Medications  Medication Sig Dispense Refill  . amLODipine (NORVASC) 2.5 MG tablet Take 1 tablet (2.5 mg total) by mouth daily. 90 tablet 3  . aspirin EC 81 MG tablet Take 1 tablet (81 mg total) by mouth daily. 90 tablet 3  . atorvastatin (LIPITOR) 40 MG tablet Take 1 tablet (40 mg total) by mouth daily. -- Office visit  needed for further refills 180 tablet 0  . benazepril (LOTENSIN) 20 MG tablet Take 1.5 tablets (30 mg total) daily. Need office visit for more refills 45 tablet 1  . brimonidine (ALPHAGAN) 0.2 % ophthalmic solution Place 1 drop into both eyes 3 (three) times daily.     . dorzolamide-timolol (COSOPT) 22.3-6.8 MG/ML ophthalmic solution Place 1 drop into both eyes 2 (two) times daily.     Marland Kitchen ELIQUIS 5 MG TABS tablet TAKE 1 TABLET TWICE DAILY 180 tablet 1  . furosemide (LASIX) 40 MG tablet Take one tablet by mouth daily as needed for swelling/weight gain 30 tablet 6  . levothyroxine (SYNTHROID, LEVOTHROID) 112 MCG tablet Take 1 tablet (112 mcg total) by mouth daily. -- Office visit needed for further refills 90 tablet 0  . metoprolol succinate (TOPROL-XL) 50 MG 24 hr tablet TAKE 1 TABLET BY MOUTH TWICE DAILY WITH OR IMMEDIATELY FOLLOWING A MEAL 180 tablet 1  . nitroGLYCERIN (NITROSTAT) 0.4 MG SL tablet Place 1 tablet (0.4 mg total) under the tongue every 5 (five) minutes as needed for chest pain. 25 tablet 1  . spironolactone (ALDACTONE) 25 MG tablet Take 1 tablet (25 mg total) by mouth daily. -- Office visit needed for further refills 90 tablet 0  . travoprost, benzalkonium, (TRAVATAN) 0.004 % ophthalmic solution Place 1 drop into both eyes at bedtime.    . miconazole (MICATIN) 2 % cream Apply 1 application topically 2 (two) times daily. (Patient not taking: Reported on 05/20/2018) 28.35 g 0  . miconazole (MICOTIN) 200 MG vaginal suppository Place 1 suppository (200 mg total) vaginally at bedtime. (Patient not taking: Reported on 05/20/2018) 3 suppository 0   No current facility-administered medications for this visit.     Allergies Definity [perflutren lipid microsphere] and Cilostazol   Physical Exam:  BP (!) 154/59   Pulse 68   Ht _0  (1.702 m)   Wt 205  lb 6.4 oz (93.2 kg)   BMI 32.17 kg/m  Body mass index is 32.17 kg/m.  General appearance: Well nourished, well developed female in no  acute distress.  Respiratory:  Normal respiratory effort Abdomen: soft, nttp, nd Neuro/Psych:  Normal mood and affect.  Skin:  Warm and dry.  Lymphatic:  No inguinal lymphadenopathy.   Pelvic exam: is not limited by body habitus EGBUS: with moderate atrophy. In bilateral distribution from inner thigh to the b/l labia areas is thin, somewhat sclerotic skin and dryness. Pt confirms has itching on inner thighs as well. Skin color in the above areas is slightly darkened vs the normal skin and looks somewhat leathery. No erythema or ttep or white areas.   Vagina: normal, moderately atrophic, no blood or discharge, normal cuff that is nttp Bimanual: negative  Laboratory: none  Radiology: none  Assessment: Lichen simplex  Plan:  D/w her re: LS dx and scratch/itch cycle. Will do clobetasol ointment qhs and see back in 2 months. If no improvement, recommend biopsy to rule out lichen sclerous. If improved, can use less potent steroid with less frequency   RTC 2 months  Aletha Halim, Brooke Bonito MD Attending Center for [facility])

## 2018-05-22 DIAGNOSIS — L280 Lichen simplex chronicus: Secondary | ICD-10-CM | POA: Insufficient documentation

## 2018-05-22 HISTORY — DX: Lichen simplex chronicus: L28.0

## 2018-05-31 ENCOUNTER — Other Ambulatory Visit: Payer: Self-pay | Admitting: Internal Medicine

## 2018-06-03 ENCOUNTER — Other Ambulatory Visit: Payer: Self-pay | Admitting: Internal Medicine

## 2018-07-01 ENCOUNTER — Telehealth: Payer: Self-pay

## 2018-07-01 NOTE — Telephone Encounter (Signed)
Pt son will have his mom to call back to schedule virtual visit

## 2018-07-01 NOTE — Telephone Encounter (Signed)
Tried calling pt to let her know she is overdue for a follow up with Dr. Lawerance Bach. Was going to try to get her set up with a virtual visit. Patient did not answer and no VM was set up.

## 2018-07-02 ENCOUNTER — Other Ambulatory Visit: Payer: Self-pay | Admitting: Internal Medicine

## 2018-07-06 NOTE — Progress Notes (Signed)
Virtual Visit via Video Note  I connected with Yvonne Bailey on 07/06/18 at  1:30 PM EDT by a video enabled telemedicine application and verified that I am speaking with the correct person using two identifiers.   I discussed the limitations of evaluation and management by telemedicine and the availability of in person appointments. The patient expressed understanding and agreed to proceed.  The patient is currently at home and I am in the office.  Her son is there with her who provides some of the history.  No referring provider.    History of Present Illness: She is here for follow up of her chronic medical conditions.   She is not exercising regularly.    CAD, Afib, chronic HFrEF, Hypertension: She is taking her medication daily. She is compliant with a low sodium diet.  She denies chest pain, palpitations, shortness of breath and regular headaches.      Leg edema:  She takes lasix as needed.  She denies any swelling at this time.  She has lost some weight and that may have helped with her chronic edema.  H/o CVA with residual expressive aphasia, poor balance, mild weakness:  She is taking all of her medications as prescribed.  She states that her balance is pretty good.  She is not doing any exercise on a regular basis.  Hyperlipidemia: She is taking her medication daily. She is compliant with a low fat/cholesterol diet. She denies myalgias.   Hypothyroidism:  She is taking her medication daily.  She denies any recent changes in energy or weight that are unexplained.   CKD: She does drink plenty of water throughout the day.  She does not take over-the-counter NSAIDs.  Prediabetes:  She is compliant with a low sugar/carbohydrate diet.      Review of Systems  Constitutional: Negative for chills, fever and malaise/fatigue.  Respiratory: Negative for cough, shortness of breath and wheezing.   Cardiovascular: Positive for leg swelling (occ - lasix prn). Negative for chest pain  and palpitations.  Gastrointestinal: Negative for heartburn and nausea.  Neurological: Positive for headaches.       Social History   Socioeconomic History  . Marital status: Married    Spouse name: Fayrene Fearing  . Number of children: 1  . Years of education: college  . Highest education level: Not on file  Occupational History  . Occupation: Runner, broadcasting/film/video    Comment: Retired  Engineer, production  . Financial resource strain: Not on file  . Food insecurity:    Worry: Never true    Inability: Never true  . Transportation needs:    Medical: No    Non-medical: No  Tobacco Use  . Smoking status: Former Smoker    Last attempt to quit: 03/11/1990    Years since quitting: 28.3  . Smokeless tobacco: Never Used  . Tobacco comment: smoked 1973- 1992, up to < 5 cigarettes  Substance and Sexual Activity  . Alcohol use: No  . Drug use: No  . Sexual activity: Not Currently    Birth control/protection: Surgical  Lifestyle  . Physical activity:    Days per week: Not on file    Minutes per session: Not on file  . Stress: Not on file  Relationships  . Social connections:    Talks on phone: Not on file    Gets together: Not on file    Attends religious service: Not on file    Active member of club or organization: Not on file  Attends meetings of clubs or organizations: Not on file    Relationship status: Not on file  Other Topics Concern  . 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 08/31/09- designated party form signed appointing, husband Rusti Harden; ok to leave msg on home phone [phone].      Caffeine Small Amount one cup very rare.   Right handed.   One child- Yvonne Bailey     Observations/Objective: Appears well in NAD   Lab Results  Component Value Date   WBC 10.9 (H) 04/23/2018   HGB 12.9 04/23/2018   HCT 41.7 04/23/2018   PLT 231 04/23/2018   GLUCOSE 102 (H) 04/23/2018   CHOL 131 06/03/2017   TRIG 94  06/03/2017   HDL 40 06/03/2017   LDLCALC 72 06/03/2017   ALT 21 04/13/2018   AST 22 04/13/2018   NA 141 04/23/2018   K 3.4 (L) 04/23/2018   CL 108 04/23/2018   CREATININE 1.01 (H) 04/23/2018   BUN 15 04/23/2018   CO2 22 04/23/2018   TSH 4.34 02/04/2017   INR 1.10 04/23/2018   HGBA1C 5.4 03/03/2017    Assessment and Plan:  See Problem List for Assessment and Plan of chronic medical problems.   Follow Up Instructions:    I discussed the assessment and treatment plan with the patient. The patient was provided an opportunity to ask questions and all were answered. The patient agreed with the plan and demonstrated an understanding of the instructions.   The patient was advised to call back or seek an in-person evaluation if the symptoms worsen or if the condition fails to improve as anticipated.    Pincus Sanes, MD

## 2018-07-07 ENCOUNTER — Encounter: Payer: Self-pay | Admitting: Internal Medicine

## 2018-07-07 ENCOUNTER — Ambulatory Visit (INDEPENDENT_AMBULATORY_CARE_PROVIDER_SITE_OTHER): Payer: Medicare HMO | Admitting: Internal Medicine

## 2018-07-07 DIAGNOSIS — I480 Paroxysmal atrial fibrillation: Secondary | ICD-10-CM | POA: Diagnosis not present

## 2018-07-07 DIAGNOSIS — N183 Chronic kidney disease, stage 3 unspecified: Secondary | ICD-10-CM

## 2018-07-07 DIAGNOSIS — R600 Localized edema: Secondary | ICD-10-CM | POA: Diagnosis not present

## 2018-07-07 DIAGNOSIS — E785 Hyperlipidemia, unspecified: Secondary | ICD-10-CM

## 2018-07-07 DIAGNOSIS — I69320 Aphasia following cerebral infarction: Secondary | ICD-10-CM

## 2018-07-07 DIAGNOSIS — I5022 Chronic systolic (congestive) heart failure: Secondary | ICD-10-CM | POA: Diagnosis not present

## 2018-07-07 DIAGNOSIS — E038 Other specified hypothyroidism: Secondary | ICD-10-CM

## 2018-07-07 DIAGNOSIS — R7303 Prediabetes: Secondary | ICD-10-CM

## 2018-07-07 DIAGNOSIS — I10 Essential (primary) hypertension: Secondary | ICD-10-CM

## 2018-07-07 MED ORDER — BENAZEPRIL HCL 20 MG PO TABS: ORAL_TABLET | ORAL | 1 refills | Status: DC

## 2018-07-07 MED ORDER — ATORVASTATIN CALCIUM 40 MG PO TABS: 40.0000 mg | ORAL_TABLET | Freq: Every day | ORAL | 1 refills | Status: DC

## 2018-07-07 MED ORDER — ATORVASTATIN CALCIUM 80 MG PO TABS: 80.0000 mg | ORAL_TABLET | Freq: Every day | ORAL | 3 refills | Status: DC

## 2018-07-07 NOTE — Assessment & Plan Note (Signed)
BP Readings from Last 3 Encounters:  05/20/18 (!) 154/59  04/23/18 (!) 139/58  04/14/18 140/72   She has lost 10 pounds, which may help with her overall blood pressure control Blood pressure is variable Since we are not able to check it here we will continue current doses of medications

## 2018-07-07 NOTE — Assessment & Plan Note (Signed)
No symptoms of hypo-or hyperthyroidism Continue current dose of levothyroxine

## 2018-07-07 NOTE — Assessment & Plan Note (Signed)
Lab Results  Component Value Date   HGBA1C 5.4 03/03/2017    She is compliant with a diabetic diet We will check A1c at her next visit

## 2018-07-07 NOTE — Assessment & Plan Note (Signed)
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

## 2018-07-07 NOTE — Assessment & Plan Note (Signed)
Appears and clinically sounds euvolemic Can continue Lasix as needed Recent CMP reviewed

## 2018-07-07 NOTE — Assessment & Plan Note (Signed)
Recent CBC showed no anemia On aspirin 81 mg, Eliquis and metoprolol Asymptomatic

## 2018-07-07 NOTE — Assessment & Plan Note (Signed)
Stable, intermittent Takes Lasix only as needed-continue

## 2018-07-07 NOTE — Assessment & Plan Note (Signed)
Recent CMP showed kidney function was stable Continue increased water Continue Lasix as needed

## 2018-07-07 NOTE — Assessment & Plan Note (Signed)
Continue statin-she has been taking 80 mg daily so prescription was updated

## 2018-07-18 ENCOUNTER — Other Ambulatory Visit: Payer: Self-pay | Admitting: Internal Medicine

## 2018-08-01 ENCOUNTER — Other Ambulatory Visit: Payer: Self-pay | Admitting: Internal Medicine

## 2018-08-08 ENCOUNTER — Encounter

## 2018-08-29 ENCOUNTER — Other Ambulatory Visit: Payer: Self-pay | Admitting: Internal Medicine

## 2018-10-07 ENCOUNTER — Other Ambulatory Visit: Payer: Self-pay | Admitting: Cardiovascular Disease

## 2018-10-08 NOTE — Telephone Encounter (Signed)
Eliquis 5mg  refill request received; pt is 78 yrs old, wt-93.2kg, Crea-1.01 on 04/23/2018, last seen by Dr. Angelena Form on 12/01/2017, Diagnosis Afib; will send in refill to requested pharmacy.

## 2018-10-17 ENCOUNTER — Other Ambulatory Visit: Payer: Self-pay | Admitting: Internal Medicine

## 2018-11-26 ENCOUNTER — Telehealth (HOSPITAL_COMMUNITY): Payer: Self-pay

## 2018-11-26 NOTE — Telephone Encounter (Signed)
Called pt's son to schedule f/u mri, no answer, no vm. AW

## 2018-11-30 ENCOUNTER — Ambulatory Visit: Payer: Self-pay

## 2018-11-30 ENCOUNTER — Other Ambulatory Visit: Payer: Self-pay | Admitting: Cardiovascular Disease

## 2018-11-30 DIAGNOSIS — I10 Essential (primary) hypertension: Secondary | ICD-10-CM

## 2018-11-30 DIAGNOSIS — I482 Chronic atrial fibrillation, unspecified: Secondary | ICD-10-CM

## 2018-11-30 MED ORDER — METOPROLOL SUCCINATE ER 50 MG PO TB24: ORAL_TABLET | ORAL | 0 refills | Status: DC

## 2018-11-30 NOTE — Telephone Encounter (Signed)
     Incoming call from Patient son who states that patient had a fall earlier this morning .  Son wanted to know what know the Patient could take for Pain.  Pain is mild to Moderate.  Comes and goes.   Reviewed protocol.  Reccomened Patient could take tylenol regular strength.  Voiced understanding. Encouraged to call back if Sx worsens. Voiced understanding.   Son states pt got up at 3 am and tripped, fell. He states she is not hurt bad, just a little sore. Not bad enough to go to ED. But he wants to know what he can give her for pain.   Call History   Reason for Disposition . [1] MODERATE back pain (e.g., interferes with normal activities) AND [2] present > 3 days  Answer Assessment - Initial Assessment Questions 1. ONSET: "When did the pain begin?"       Fall  This morning  2. LOCATION: "Where does it hurt?" (upper, mid or lower back)     Lower  3. SEVERITY: "How bad is the pain?"  (e.g., Scale 1-10; mild, moderate, or severe)   - MILD (1-3): doesn't interfere with normal activities    - MODERATE (4-7): interferes with normal activities or awakens from sleep    - SEVERE (8-10): excruciating pain, unable to do any normal activities      Mild to moderate  4. PATTERN: "Is the pain constant?" (e.g., yes, no; constant, intermittent)      Comes and goes 5. RADIATION: "Does the pain shoot into your legs or elsewhere?"     no 6. CAUSE:  "What do you think is causing the back pain?"      Patient fell 7. BACK OVERUSE:  "Any recent lifting of heavy objects, strenuous work or exercise?"    Denies  8. MEDICATIONS: "What have you taken so far for the pain?" (e.g., nothing, acetaminophen, NSAIDS)     *No Answer* 9. NEUROLOGIC SYMPTOMS: "Do you have any weakness, numbness, or problems with bowel/bladder control?"      10. OTHER SYMPTOMS: "Do you have any other symptoms?" (e.g., fever, abdominal pain, burning with urination, blood in urine)      Son states that Patients pain is mild to  moderate.  11. PREGNANCY: "Is there any chance you are pregnant?" (e.g., yes, no; LMP)      na  Protocols used: BACK PAIN-A-AH

## 2018-12-30 ENCOUNTER — Encounter: Payer: Self-pay | Admitting: Podiatry

## 2018-12-30 ENCOUNTER — Other Ambulatory Visit: Payer: Self-pay | Admitting: Cardiovascular Disease

## 2018-12-30 DIAGNOSIS — I10 Essential (primary) hypertension: Secondary | ICD-10-CM

## 2018-12-30 DIAGNOSIS — I482 Chronic atrial fibrillation, unspecified: Secondary | ICD-10-CM

## 2018-12-30 MED ORDER — METOPROLOL SUCCINATE ER 50 MG PO TB24: ORAL_TABLET | ORAL | 0 refills | Status: DC

## 2019-01-02 ENCOUNTER — Other Ambulatory Visit: Payer: Self-pay | Admitting: Internal Medicine

## 2019-01-05 NOTE — Progress Notes (Signed)
Subjective:    Patient ID: Yvonne Bailey, female    DOB: [birth year], 78 y.o.   MRN: [id number]  HPI The patient is here for follow up.  s reviewed with patient and updated if appropriate.  Patient Active Problem List   Diagnosis Date Noted  . 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 Dysuria 04/14/2018  . Vulvar itching 04/14/2018  . Leukocytosis   . Aphasia as late effect of cerebrovascular accident (CVA)   . Acute ischemic cerebrovascular accident (CVA) involving left middle cerebral artery territory [facility])   . Coronary artery disease involving native coronary artery without angina pectoris   . Stage 3 chronic kidney disease   . Stroke (cerebrum) ([place]) 03/02/2017  . Prediabetes 07/28/2016  . DOE (dyspnea on exertion) 07/09/2016  . Gait instability 07/09/2016  . Itching 04/16/2016  . Unstable angina ([place]) 09/06/2015  . Cardiomyopathy, ischemic   . Chronic systolic heart failure ([place]) 08/21/2015  . Bilateral leg edema 07/19/2015  . Atrial fibrillation ([place]) 07/19/2015  . Urinary urgency 03/30/2015  . Other musculoskeletal symptoms referable to limbs(729.89) 12/17/2012  . Lacunar infarction ([place]) 12/17/2012  . Small vessel disease, cerebrovascular 12/17/2012  . CAROTID BRUIT, RIGHT 08/10/2008  . Peripheral vascular disease ([place]) 06/04/2007  . Diverticulosis of large intestine 06/04/2007  . Hypothyroidism 02/24/2007  . Hyperlipidemia 02/24/2007  . Essential hypertension 02/24/2007  . Acute thromboembolism of deep veins of lower extremity ([place]) 01/21/2007  . Coronary atherosclerosis 10/29/2006    Current Outpatient Medications on File Prior to Visit  Medication Sig Dispense Refill  . amLODipine (NORVASC) 2.5 MG tablet TAKE 1 TABLET(2.5 MG) BY MOUTH DAILY 90 tablet 1  . aspirin EC 81 MG tablet Take 1 tablet (81 mg total) by mouth daily. 90 tablet 3  . atorvastatin (LIPITOR) 80 MG tablet Take 1 tablet (80 mg total) by mouth daily. Disregard 40 mg dose 90 tablet 3  .  benazepril (LOTENSIN) 20 MG tablet TAKE 1 AND 1/2 TABLETS(30 MG) BY MOUTH DAILY 135 tablet 0  . brimonidine (ALPHAGAN) 0.2 % ophthalmic solution Place 1 drop into both eyes 3 (three) times daily.     . dorzolamide-timolol (COSOPT) 22.3-6.8 MG/ML ophthalmic solution Place 1 drop into both eyes 2 (two) times daily.     Marland Kitchen ELIQUIS 5 MG TABS tablet TAKE 1 TABLET TWICE DAILY 180 tablet 1  . furosemide (LASIX) 40 MG tablet Take one tablet by mouth daily as needed for swelling/weight gain 30 tablet 6  . levothyroxine (SYNTHROID) 112 MCG tablet TAKE 1 TABLET BY MOUTH DAILY 90 tablet 0  . metoprolol succinate (TOPROL-XL) 50 MG 24 hr tablet TAKE 1 TABLET BY MOUTH TWICE DAILY WITH OR IMMEDIATELY FOLLOWING A MEAL. Please make overdue appt with Dr. Angelena Form before anymore refills. 2nd attempt 30 tablet 0  . nitroGLYCERIN (NITROSTAT) 0.4 MG SL tablet Place 1 tablet (0.4 mg total) under the tongue every 5 (five) minutes as needed for chest pain. 25 tablet 1  . spironolactone (ALDACTONE) 25 MG tablet TAKE 1 TABLET(25 MG) BY MOUTH DAILY 90 tablet 1  . travoprost, benzalkonium, (TRAVATAN) 0.004 % ophthalmic solution Place 1 drop into both eyes at bedtime.     No current facility-administered medications on file prior to visit.     Past Medical History:  Diagnosis Date  . CAD (coronary artery disease)    a. anterior MI s/p PCI in 1992. b. cath 08/2015 with severe three-vessel CAD turned down for CABG and underwent DESx5 to prox Cx/OM2/D1/oRCA/mRCA  . Carotid  artery disease ([place])    a. carotid duplex 03/2015 showed 1-39% BICA, normal subclavian arteries, chronically occluded left vertebral, f/u recommended only PRN.  . DVT (deep venous thrombosis) ([place])    X1  . History of nuclear stress test    a. Myoview 6/17: EF 20-25%, mid anteroseptal, apical anterior, apical septal, apical inferior, apical lateral and apical scar, no ischemia, intermediate risk  . HTN (hypertension)   . Hyperlipidemia   . Hypokalemia   .  Hypothyroidism   . Ischemic cardiomyopathy    a. Echo 6/17: EF 20-25%, apex appears akinetic, MAC, moderate MR, moderate LAE, mild RVE, trivial PI, PASP 47 mmHg (needs repeat with Definity contrast).  b. Limited echo with Definity contrast 7/17: EF 25-30%, moderate to severe LAE. c. Limited Echo 2018 showed EF 40-45%.  . Longstanding persistent atrial fibrillation   . MI (myocardial infarction) ([place]) 1992  . PAD (peripheral artery disease) ([facility])    Right SFA occlusion, severe disease left CFA and SFA  . Stroke [facility]    a. 02/2017 in setting of noncompliance with Eliquis  . TIA (transient ischemic attack)   . Tricuspid regurgitation     Past Surgical History:  Procedure Laterality Date  . APPENDECTOMY     at hysterectomy and USO for fibroids, Dr. Ysidro Evert  . CARDIAC CATHETERIZATION  1992   Dr Eustace Quail  . CARDIAC CATHETERIZATION N/A 09/06/2015   Procedure: Right/Left Heart Cath and Coronary Angiography;  Surgeon: Burnell Blanks, MD;  Location: [facility];  Service: Cardiovascular;  Laterality: N/A;  . CARDIAC CATHETERIZATION N/A 09/07/2015   Procedure: Coronary Stent Intervention;  Surgeon: Burnell Blanks, MD;  Location: [facility];  Service: Cardiovascular;  Laterality: N/A;  . COLONOSCOPY     negative; 2008, Dr. Delfin Edis  . fracture LLE     '94; pinned  . IR ANGIO INTRA EXTRACRAN SEL COM CAROTID INNOMINATE BILAT MOD SED  03/02/2017  . IR ANGIO INTRA EXTRACRAN SEL COM CAROTID INNOMINATE BILAT MOD SED  04/23/2018  . IR ANGIO VERTEBRAL SEL SUBCLAVIAN INNOMINATE BILAT MOD SED  04/23/2018  . IR ANGIO VERTEBRAL SEL VERTEBRAL UNI R MOD SED  03/02/2017  . IR RADIOLOGIST EVAL & MGMT  04/28/2017  . IR US GUIDE VASC ACCESS RIGHT  04/23/2018  . RADIOLOGY WITH ANESTHESIA N/A 03/02/2017   Procedure: RADIOLOGY WITH ANESTHESIA;  Surgeon: Luanne Bras, MD;  Location: [facility];  Service: Radiology;  Laterality: N/A;  . TONSILLECTOMY    . TOTAL ABDOMINAL  HYSTERECTOMY     & BSO for fibroids    Social History   Socioeconomic History  . Marital status: Married    Spouse name: [name]  . Number of children: 1  . Years of education: college  . Highest education level: Not on file  Occupational History  . Occupation: Pharmacist, hospital    Comment: Retired  Scientific laboratory technician  . Financial resource strain: Not on file  . Food insecurity    Worry: Never true    Inability: Never true  . Transportation needs    Medical: No    Non-medical: No  Tobacco Use  . Smoking status: Former Smoker    Quit date: 03/11/1990    Years since quitting: 28.8  . Smokeless tobacco: Never Used  . Tobacco comment: smoked Early, up to < 5 cigarettes  Substance and Sexual Activity  . Alcohol use: No  . Drug use: No  . Sexual activity: Not Currently    Birth control/protection:  Surgical  Lifestyle  . Physical activity    Days per week: Not on file    Minutes per session: Not on file  . Stress: Not on file  Relationships  . Social Herbalist on phone: Not on file    Gets together: Not on file    Attends religious service: Not on file    Active member of club or organization: Not on file    Attends meetings of clubs or organizations: Not on file    Relationship status: Not on file  Other Topics Concern  . 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 08/31/09- designated party form signed appointing, husband Yasmin Bronaugh; ok to leave msg on home phone [phone].      Caffeine Small Amount one cup very rare.   Right handed.   One child- [name]    Family History  Problem Relation Age of Onset  . Diabetes Mother   . Hypertension Mother   . Stroke Brother        ?> 55  . Coronary artery disease Brother        stent in 9s  . Cancer Neg Hx     Review of Systems     Objective:  There were no vitals filed for this visit. BP Readings from Last 3 Encounters:  05/20/18 (!)  154/59  04/23/18 (!) 139/58  04/14/18 140/72   Wt Readings from Last 3 Encounters:  05/20/18 205 lb 6.4 oz (93.2 kg)  04/23/18 218 lb (98.9 kg)  04/14/18 210 lb (95.3 kg)   There is no height or weight on file to calculate BMI.   Physical Exam          Assessment & Plan:    See Problem List for Assessment and Plan of chronic medical problems.   This encounter was created in error - please disregard.

## 2019-01-06 ENCOUNTER — Encounter: Payer: Medicare HMO | Admitting: Internal Medicine

## 2019-01-11 ENCOUNTER — Ambulatory Visit: Payer: Medicare HMO | Admitting: Podiatry

## 2019-01-14 ENCOUNTER — Other Ambulatory Visit: Payer: Self-pay | Admitting: Internal Medicine

## 2019-01-14 NOTE — Telephone Encounter (Signed)
Refilled thyroid med for one month.  Missed last f/u appt with me - -needs to reschedule

## 2019-01-14 NOTE — Telephone Encounter (Signed)
Appointment scheduled.

## 2019-01-15 ENCOUNTER — Ambulatory Visit: Payer: Medicare HMO | Admitting: Podiatry

## 2019-01-15 ENCOUNTER — Telehealth: Payer: Self-pay

## 2019-01-15 DIAGNOSIS — I482 Chronic atrial fibrillation, unspecified: Secondary | ICD-10-CM

## 2019-01-15 DIAGNOSIS — I10 Essential (primary) hypertension: Secondary | ICD-10-CM

## 2019-01-15 MED ORDER — METOPROLOL SUCCINATE ER 50 MG PO TB24: 50.0000 mg | ORAL_TABLET | Freq: Every day | ORAL | 0 refills | Status: DC

## 2019-01-15 NOTE — Telephone Encounter (Signed)
Refill sent for Metoprolol.  

## 2019-01-18 ENCOUNTER — Other Ambulatory Visit: Payer: Self-pay

## 2019-01-18 DIAGNOSIS — I482 Chronic atrial fibrillation, unspecified: Secondary | ICD-10-CM

## 2019-01-18 DIAGNOSIS — I10 Essential (primary) hypertension: Secondary | ICD-10-CM

## 2019-01-18 MED ORDER — METOPROLOL SUCCINATE ER 50 MG PO TB24: 50.0000 mg | ORAL_TABLET | Freq: Every day | ORAL | 0 refills | Status: DC

## 2019-02-01 ENCOUNTER — Telehealth (HOSPITAL_COMMUNITY): Payer: Self-pay

## 2019-02-01 NOTE — Patient Instructions (Signed)

## 2019-02-01 NOTE — Progress Notes (Signed)
Subjective:    Patient ID: Yvonne Bailey, female    DOB: 18-Nov-1940, 78 y.o.   MRN: [id number]  HPI The patient is here for follow up.    Medications and allergies reviewed with patient and updated if appropriate.  Patient Active Problem List   Diagnosis Date Noted  . 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 Dysuria 04/14/2018  . Vulvar itching 04/14/2018  . Leukocytosis   . Aphasia as late effect of cerebrovascular accident (CVA)   . Acute ischemic cerebrovascular accident (CVA) involving left middle cerebral artery territory [facility])   . Coronary artery disease involving native coronary artery without angina pectoris   . Stage 3 chronic kidney disease   . Stroke (cerebrum) ([place]) 03/02/2017  . Prediabetes 07/28/2016  . DOE (dyspnea on exertion) 07/09/2016  . Gait instability 07/09/2016  . Itching 04/16/2016  . Unstable angina ([place]) 09/06/2015  . Cardiomyopathy, ischemic   . Chronic systolic heart failure ([place]) 08/21/2015  . Bilateral leg edema 07/19/2015  . Atrial fibrillation ([place]) 07/19/2015  . Urinary urgency 03/30/2015  . Other musculoskeletal symptoms referable to limbs(729.89) 12/17/2012  . Lacunar infarction ([place]) 12/17/2012  . Small vessel disease, cerebrovascular 12/17/2012  . CAROTID BRUIT, RIGHT 08/10/2008  . Peripheral vascular disease ([place]) 06/04/2007  . Diverticulosis of large intestine 06/04/2007  . Hypothyroidism 02/24/2007  . Hyperlipidemia 02/24/2007  . Essential hypertension 02/24/2007  . Acute thromboembolism of deep veins of lower extremity ([place]) 01/21/2007  . Coronary atherosclerosis 10/29/2006    Current Outpatient Medications on File Prior to Visit  Medication Sig Dispense Refill  . amLODipine (NORVASC) 2.5 MG tablet TAKE 1 TABLET(2.5 MG) BY MOUTH DAILY 90 tablet 1  . aspirin EC 81 MG tablet Take 1 tablet (81 mg total) by mouth daily. 90 tablet 3  . atorvastatin (LIPITOR) 80 MG tablet Take 1 tablet (80 mg total) by mouth daily. Disregard  40 mg dose 90 tablet 3  . benazepril (LOTENSIN) 20 MG tablet TAKE 1 AND 1/2 TABLETS(30 MG) BY MOUTH DAILY 135 tablet 0  . brimonidine (ALPHAGAN) 0.2 % ophthalmic solution Place 1 drop into both eyes 3 (three) times daily.     . dorzolamide-timolol (COSOPT) 22.3-6.8 MG/ML ophthalmic solution Place 1 drop into both eyes 2 (two) times daily.     Marland Kitchen ELIQUIS 5 MG TABS tablet TAKE 1 TABLET TWICE DAILY 180 tablet 1  . furosemide (LASIX) 40 MG tablet Take one tablet by mouth daily as needed for swelling/weight gain 30 tablet 6  . levothyroxine (SYNTHROID) 112 MCG tablet Take 1 tablet (112 MCG) by mouth daily. Needs labs for more refills 30 tablet 0  . metoprolol succinate (TOPROL-XL) 50 MG 24 hr tablet Take 1 tablet (50 mg total) by mouth daily. Please call ans schedule an appt for further refills 2nd attempt 15 tablet 0  . nitroGLYCERIN (NITROSTAT) 0.4 MG SL tablet Place 1 tablet (0.4 mg total) under the tongue every 5 (five) minutes as needed for chest pain. 25 tablet 1  . spironolactone (ALDACTONE) 25 MG tablet TAKE 1 TABLET(25 MG) BY MOUTH DAILY 90 tablet 1  . travoprost, benzalkonium, (TRAVATAN) 0.004 % ophthalmic solution Place 1 drop into both eyes at bedtime.     No current facility-administered medications on file prior to visit.     Past Medical History:  Diagnosis Date  . CAD (coronary artery disease)    a. anterior MI s/p PCI in 1992. b. cath 08/2015 with severe three-vessel CAD turned down for CABG and underwent DESx5  to prox Cx/OM2/D1/oRCA/mRCA  . Carotid artery disease ([place])    a. carotid duplex 03/2015 showed 1-39% BICA, normal subclavian arteries, chronically occluded left vertebral, f/u recommended only PRN.  . DVT (deep venous thrombosis) ([place])    X1  . History of nuclear stress test    a. Myoview 6/17: EF 20-25%, mid anteroseptal, apical anterior, apical septal, apical inferior, apical lateral and apical scar, no ischemia, intermediate risk  . HTN (hypertension)   . Hyperlipidemia    . Hypokalemia   . Hypothyroidism   . Ischemic cardiomyopathy    a. Echo 6/17: EF 20-25%, apex appears akinetic, MAC, moderate MR, moderate LAE, mild RVE, trivial PI, PASP 47 mmHg (needs repeat with Definity contrast).  b. Limited echo with Definity contrast 7/17: EF 25-30%, moderate to severe LAE. c. Limited Echo 2018 showed EF 40-45%.  . Longstanding persistent atrial fibrillation   . MI (myocardial infarction) ([place]) 1992  . PAD (peripheral artery disease) (H[facility])    Right SFA occlusion, severe disease left CFA and SFA  . Stroke [facility])    a. 02/2017 in setting of noncompliance with Eliquis  . TIA (transient ischemic attack)   . Tricuspid regurgitation     Past Surgical History:  Procedure Laterality Date  . APPENDECTOMY     at hysterectomy and USO for fibroids, Dr. Ysidro Evert  . CARDIAC CATHETERIZATION  1992   Dr Eustace Quail  . CARDIAC CATHETERIZATION N/A 09/06/2015   Procedure: Right/Left Heart Cath and Coronary Angiography;  Surgeon: Burnell Blanks, MD;  Location: [place] [facility];  Service: Cardiovascular;  Laterality: N/A;  . CARDIAC CATHETERIZATION N/A 09/07/2015   Procedure: Coronary Stent Intervention;  Surgeon: Burnell Blanks, MD;  Location: [place] [facility];  Service: Cardiovascular;  Laterality: N/A;  . COLONOSCOPY     negative; 2008, Dr. Delfin Edis  . fracture LLE     '94; pinned  . IR ANGIO INTRA EXTRACRAN SEL COM CAROTID INNOMINATE BILAT MOD SED  03/02/2017  . IR ANGIO INTRA EXTRACRAN SEL COM CAROTID INNOMINATE BILAT MOD SED  04/23/2018  . IR ANGIO VERTEBRAL SEL SUBCLAVIAN INNOMINATE BILAT MOD SED  04/23/2018  . IR ANGIO VERTEBRAL SEL VERTEBRAL UNI R MOD SED  03/02/2017  . IR RADIOLOGIST EVAL & MGMT  04/28/2017  . IR US GUIDE VASC ACCESS RIGHT  04/23/2018  . RADIOLOGY WITH ANESTHESIA N/A 03/02/2017   Procedure: RADIOLOGY WITH ANESTHESIA;  Surgeon: Luanne Bras, MD;  Location: [place];  Service: Radiology;  Laterality: N/A;  . TONSILLECTOMY    . TOTAL  ABDOMINAL HYSTERECTOMY     & BSO for fibroids    Social History   Socioeconomic History  . Marital status: Married    Spouse name: [name]  . Number of children: 1  . Years of education: college  . Highest education level: Not on file  Occupational History  . Occupation: Pharmacist, hospital    Comment: Retired  Scientific laboratory technician  . Financial resource strain: Not on file  . Food insecurity    Worry: Never true    Inability: Never true  . Transportation needs    Medical: No    Non-medical: No  Tobacco Use  . Smoking status: Former Smoker    Quit date: 03/11/1990    Years since quitting: 28.9  . Smokeless tobacco: Never Used  . Tobacco comment: smoked [place], up to < 5 cigarettes  Substance and Sexual Activity  . Alcohol use: No  . Drug use: No  . Sexual activity: Not  Currently    Birth control/protection: Surgical  Lifestyle  . Physical activity    Days per week: Not on file    Minutes per session: Not on file  . Stress: Not on file  Relationships  . Social Herbalist on phone: Not on file    Gets together: Not on file    Attends religious service: Not on file    Active member of club or organization: Not on file    Attends meetings of clubs or organizations: Not on file    Relationship status: Not on file  Other Topics Concern  . 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 08/31/09- designated party form signed appointing, husband Virna Livengood; ok to leave msg on home phone [phone].      Caffeine Small Amount one cup very rare.   Right handed.   One child- [name]    Family History  Problem Relation Age of Onset  . Diabetes Mother   . Hypertension Mother   . Stroke Brother        ?> 55  . Coronary artery disease Brother        stent in 77s  . Cancer Neg Hx     Review of Systems     Objective:  There were no vitals filed for this visit. BP Readings from Last 3 Encounters:   05/20/18 (!) 154/59  04/23/18 (!) 139/58  04/14/18 140/72   Wt Readings from Last 3 Encounters:  05/20/18 205 lb 6.4 oz (93.2 kg)  04/23/18 218 lb (98.9 kg)  04/14/18 210 lb (95.3 kg)   There is no height or weight on file to calculate BMI.   Physical Exam         Assessment & Plan:    See Problem List for Assessment and Plan of chronic medical problems.   This encounter was created in error - please disregard.

## 2019-02-01 NOTE — Telephone Encounter (Signed)
Called to schedule f/u mri, no answer, no vm. AW 

## 2019-02-02 ENCOUNTER — Encounter: Payer: Medicare HMO | Admitting: Internal Medicine

## 2019-02-08 NOTE — Patient Instructions (Addendum)
  Tests ordered today. Your results will be released to MyChart (or called to you) after review.  If any changes need to be made, you will be notified at that same time.   Medications reviewed and updated.  Changes include :   none  Your prescription(s) have been submitted to your pharmacy. Please take as directed and contact our office if you believe you are having problem(s) with the medication(s).    Please followup in 6 months   

## 2019-02-08 NOTE — Progress Notes (Signed)
Subjective:    Patient ID: Yvonne Bailey, female    DOB: [birth year], 78 y.o.   MRN: [id number]  HPI The patient is here for follow up.  She is here with her granddaughter.  She is not exercising regularly.  She is eating healthy, but eating less.  She has lost a little weight since she was here last.  CAD, Afib, HFrEF, Hypertension: She is taking her medication daily. She is compliant with a low sodium diet.  She denies chest pain, palpitations, edema, shortness of breath and regular headaches. She does not monitor her blood pressure at home.    Leg edema:  She is taking lasix as needed.   She is not elevating her legs consistently.  She feels the leg swelling overall is controlled.  H/o CVA with residual expressive aphasia, poor balance, mild weakness: She is taking all of her medication as prescribed.  She is not currently doing any exercises.  Hyperlipidemia: She is taking her medication daily. She is compliant with a low fat/cholesterol diet. She denies myalgias.   Hypothyroidism:  She is taking her medication daily.  She denies any recent changes in energy or weight that are unexplained.    Prediabetes:  She is compliant with a low sugar/carbohydrate diet.  She is not exercising regularly.     Medications and allergies reviewed with patient and updated if appropriate.  Patient Active Problem List   Diagnosis Date Noted  . 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 Dysuria 04/14/2018  . Vulvar itching 04/14/2018  . Leukocytosis   . Aphasia as late effect of cerebrovascular accident (CVA)   . Acute ischemic cerebrovascular accident (CVA) involving left middle cerebral artery territory [facility])   . Coronary artery disease involving native coronary artery without angina pectoris   . Stage 3 chronic kidney disease   . Stroke (cerebrum) ([place]) 03/02/2017  . Prediabetes 07/28/2016  . DOE (dyspnea on exertion) 07/09/2016  . Gait instability 07/09/2016  . Itching 04/16/2016  .  Unstable angina ([place]) 09/06/2015  . Cardiomyopathy, ischemic   . Chronic systolic heart failure ([place]) 08/21/2015  . Bilateral leg edema 07/19/2015  . Atrial fibrillation ([place]) 07/19/2015  . Urinary urgency 03/30/2015  . Other musculoskeletal symptoms referable to limbs(729.89) 12/17/2012  . Lacunar infarction ([place]) 12/17/2012  . Small vessel disease, cerebrovascular 12/17/2012  . CAROTID BRUIT, RIGHT 08/10/2008  . Peripheral vascular disease ([place]) 06/04/2007  . Diverticulosis of large intestine 06/04/2007  . Hypothyroidism 02/24/2007  . Hyperlipidemia 02/24/2007  . Essential hypertension 02/24/2007  . Acute thromboembolism of deep veins of lower extremity ([place]) 01/21/2007  . Coronary atherosclerosis 10/29/2006    Current Outpatient Medications on File Prior to Visit  Medication Sig Dispense Refill  . aspirin EC 81 MG tablet Take 1 tablet (81 mg total) by mouth daily. 90 tablet 3  . atorvastatin (LIPITOR) 80 MG tablet Take 1 tablet (80 mg total) by mouth daily. Disregard 40 mg dose 90 tablet 3  . benazepril (LOTENSIN) 20 MG tablet TAKE 1 AND 1/2 TABLETS(30 MG) BY MOUTH DAILY 135 tablet 0  . brimonidine (ALPHAGAN) 0.2 % ophthalmic solution Place 1 drop into both eyes 3 (three) times daily.     . dorzolamide-timolol (COSOPT) 22.3-6.8 MG/ML ophthalmic solution Place 1 drop into both eyes 2 (two) times daily.     Marland Kitchen ELIQUIS 5 MG TABS tablet TAKE 1 TABLET TWICE DAILY 180 tablet 1  . furosemide (LASIX) 40 MG tablet Take one tablet by mouth daily as needed for  swelling/weight gain 30 tablet 6  . levothyroxine (SYNTHROID) 112 MCG tablet Take 1 tablet (112 MCG) by mouth daily. Needs labs for more refills 30 tablet 0  . metoprolol succinate (TOPROL-XL) 50 MG 24 hr tablet Take 1 tablet (50 mg total) by mouth daily. Please call ans schedule an appt for further refills 2nd attempt 15 tablet 0  . nitroGLYCERIN (NITROSTAT) 0.4 MG SL tablet Place 1 tablet (0.4 mg total) under the tongue every 5 (five)  minutes as needed for chest pain. 25 tablet 1  . travoprost, benzalkonium, (TRAVATAN) 0.004 % ophthalmic solution Place 1 drop into both eyes at bedtime.     No current facility-administered medications on file prior to visit.     Past Medical History:  Diagnosis Date  . CAD (coronary artery disease)    a. anterior MI s/p PCI in 1992. b. cath 08/2015 with severe three-vessel CAD turned down for CABG and underwent DESx5 to prox Cx/OM2/D1/oRCA/mRCA  . Carotid artery disease ([place])    a. carotid duplex 03/2015 showed 1-39% BICA, normal subclavian arteries, chronically occluded left vertebral, f/u recommended only PRN.  . DVT (deep venous thrombosis) ([place])    X1  . History of nuclear stress test    a. Myoview 6/17: EF 20-25%, mid anteroseptal, apical anterior, apical septal, apical inferior, apical lateral and apical scar, no ischemia, intermediate risk  . HTN (hypertension)   . Hyperlipidemia   . Hypokalemia   . Hypothyroidism   . Ischemic cardiomyopathy    a. Echo 6/17: EF 20-25%, apex appears akinetic, MAC, moderate MR, moderate LAE, mild RVE, trivial PI, PASP 47 mmHg (needs repeat with Definity contrast).  b. Limited echo with Definity contrast 7/17: EF 25-30%, moderate to severe LAE. c. Limited Echo 2018 showed EF 40-45%.  . Longstanding persistent atrial fibrillation ([place])   . MI (myocardial infarction) ([place]) 1992  . PAD (peripheral artery disease) ([facility])    Right SFA occlusion, severe disease left CFA and SFA  . Stroke [facility])    a. 02/2017 in setting of noncompliance with Eliquis  . TIA (transient ischemic attack)   . Tricuspid regurgitation     Past Surgical History:  Procedure Laterality Date  . APPENDECTOMY     at hysterectomy and USO for fibroids, Dr. Ysidro Evert  . CARDIAC CATHETERIZATION  1992   Dr Eustace Quail  . CARDIAC CATHETERIZATION N/A 09/06/2015   Procedure: Right/Left Heart Cath and Coronary Angiography;  Surgeon: Burnell Blanks, MD;  Location: [place] [facility];   Service: Cardiovascular;  Laterality: N/A;  . CARDIAC CATHETERIZATION N/A 09/07/2015   Procedure: Coronary Stent Intervention;  Surgeon: Burnell Blanks, MD;  Location: [place] [facility];  Service: Cardiovascular;  Laterality: N/A;  . COLONOSCOPY     negative; 2008, Dr. Delfin Edis  . fracture LLE     '94; pinned  . IR ANGIO INTRA EXTRACRAN SEL COM CAROTID INNOMINATE BILAT MOD SED  03/02/2017  . IR ANGIO INTRA EXTRACRAN SEL COM CAROTID INNOMINATE BILAT MOD SED  04/23/2018  . IR ANGIO VERTEBRAL SEL SUBCLAVIAN INNOMINATE BILAT MOD SED  04/23/2018  . IR ANGIO VERTEBRAL SEL VERTEBRAL UNI R MOD SED  03/02/2017  . IR RADIOLOGIST EVAL & MGMT  04/28/2017  . IR US GUIDE VASC ACCESS RIGHT  04/23/2018  . RADIOLOGY WITH ANESTHESIA N/A 03/02/2017   Procedure: RADIOLOGY WITH ANESTHESIA;  Surgeon: Luanne Bras, MD;  Location: [place];  Service: Radiology;  Laterality: N/A;  . TONSILLECTOMY    . TOTAL ABDOMINAL HYSTERECTOMY     &  BSO for fibroids    Social History   Socioeconomic History  . Marital status: Married    Spouse name: [name]  . Number of children: 1  . Years of education: college  . Highest education level: Not on file  Occupational History  . Occupation: Pharmacist, hospital    Comment: Retired  Scientific laboratory technician  . Financial resource strain: Not on file  . Food insecurity    Worry: Never true    Inability: Never true  . Transportation needs    Medical: No    Non-medical: No  Tobacco Use  . Smoking status: Former Smoker    Quit date: 03/11/1990    Years since quitting: 28.9  . Smokeless tobacco: Never Used  . Tobacco comment: smoked [place], up to < 5 cigarettes  Substance and Sexual Activity  . Alcohol use: No  . Drug use: No  . Sexual activity: Not Currently    Birth control/protection: Surgical  Lifestyle  . Physical activity    Days per week: Not on file    Minutes per session: Not on file  . Stress: Not on file  Relationships  . Social Herbalist on phone:  Not on file    Gets together: Not on file    Attends religious service: Not on file    Active member of club or organization: Not on file    Attends meetings of clubs or organizations: Not on file    Relationship status: Not on file  Other Topics Concern  . 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 08/31/09- designated party form signed appointing, husband Autum Benfer; ok to leave msg on home phone [phone].      Caffeine Small Amount one cup very rare.   Right handed.   One child- [name]    Family History  Problem Relation Age of Onset  . Diabetes Mother   . Hypertension Mother   . Stroke Brother        ?> 55  . Coronary artery disease Brother        stent in 78s  . Cancer Neg Hx     Review of Systems  Constitutional: Negative for chills, fatigue and fever.  Respiratory: Negative for cough, shortness of breath and wheezing.   Cardiovascular: Positive for leg swelling (controlled). Negative for chest pain and palpitations.  Neurological: Negative for dizziness, light-headedness and headaches.       Objective:   Vitals:   02/09/19 1558  BP: 128/64  Pulse: 99  Resp: 16  Temp: 98 F (36.7 C)  SpO2: 99%   BP Readings from Last 3 Encounters:  02/09/19 128/64  05/20/18 (!) 154/59  04/23/18 (!) 139/58   Wt Readings from Last 3 Encounters:  02/09/19 203 lb (92.1 kg)  05/20/18 205 lb 6.4 oz (93.2 kg)  04/23/18 218 lb (98.9 kg)   Body mass index is 31.79 kg/m.   Physical Exam    Constitutional: Appears well-developed and well-nourished. No distress.  HENT:  Head: Normocephalic and atraumatic.  Neck: Neck supple. No tracheal deviation present. No thyromegaly present.  No cervical lymphadenopathy Cardiovascular: Normal rate, irregular rhythm and normal heart sounds.  No murmur heard. No carotid bruit .  1+ mildly pitting bilateral lower extremity edema Pulmonary/Chest: Effort normal  and breath sounds normal. No respiratory distress. No has no wheezes. No rales.  Skin: Skin is warm and dry. Not diaphoretic.  Psychiatric: Normal mood and affect. Behavior is normal.      Assessment & Plan:    See Problem List for Assessment and Plan of chronic medical problems.     This visit occurred during the SARS-CoV-2 public health emergency.  Safety protocols were in place, including screening questions prior to the visit, additional usage of staff PPE, and extensive cleaning of exam room while observing appropriate contact time as indicated for disinfecting solutions.

## 2019-02-09 ENCOUNTER — Ambulatory Visit (INDEPENDENT_AMBULATORY_CARE_PROVIDER_SITE_OTHER): Payer: Medicare HMO | Admitting: Internal Medicine

## 2019-02-09 ENCOUNTER — Encounter: Payer: Self-pay | Admitting: Internal Medicine

## 2019-02-09 ENCOUNTER — Other Ambulatory Visit (INDEPENDENT_AMBULATORY_CARE_PROVIDER_SITE_OTHER): Payer: Medicare HMO

## 2019-02-09 ENCOUNTER — Other Ambulatory Visit: Payer: Self-pay

## 2019-02-09 VITALS — BP 128/64 | HR 99 | Temp 98.0°F | Resp 16 | Ht 67.0 in | Wt 203.0 lb

## 2019-02-09 DIAGNOSIS — I5022 Chronic systolic (congestive) heart failure: Secondary | ICD-10-CM

## 2019-02-09 DIAGNOSIS — I480 Paroxysmal atrial fibrillation: Secondary | ICD-10-CM

## 2019-02-09 DIAGNOSIS — I10 Essential (primary) hypertension: Secondary | ICD-10-CM

## 2019-02-09 DIAGNOSIS — R600 Localized edema: Secondary | ICD-10-CM

## 2019-02-09 DIAGNOSIS — E785 Hyperlipidemia, unspecified: Secondary | ICD-10-CM

## 2019-02-09 DIAGNOSIS — R7303 Prediabetes: Secondary | ICD-10-CM

## 2019-02-09 DIAGNOSIS — E038 Other specified hypothyroidism: Secondary | ICD-10-CM

## 2019-02-09 DIAGNOSIS — N1831 Chronic kidney disease, stage 3a: Secondary | ICD-10-CM

## 2019-02-09 DIAGNOSIS — I69320 Aphasia following cerebral infarction: Secondary | ICD-10-CM | POA: Diagnosis not present

## 2019-02-09 LAB — CBC WITH DIFFERENTIAL/PLATELET
Basophils Absolute: 0.1 10*3/uL (ref 0.0–0.1)
Basophils Relative: 0.8 % (ref 0.0–3.0)
Eosinophils Absolute: 0.2 10*3/uL (ref 0.0–0.7)
Eosinophils Relative: 2.3 % (ref 0.0–5.0)
HCT: 36.8 % (ref 36.0–46.0)
Hemoglobin: 12.1 g/dL (ref 12.0–15.0)
Lymphocytes Relative: 22.4 % (ref 12.0–46.0)
Lymphs Abs: 2.2 10*3/uL (ref 0.7–4.0)
MCHC: 32.9 g/dL (ref 30.0–36.0)
MCV: 95.3 fl (ref 78.0–100.0)
Monocytes Absolute: 0.8 10*3/uL (ref 0.1–1.0)
Monocytes Relative: 8.2 % (ref 3.0–12.0)
Neutro Abs: 6.4 10*3/uL (ref 1.4–7.7)
Neutrophils Relative %: 66.3 % (ref 43.0–77.0)
Platelets: 231 10*3/uL (ref 150.0–400.0)
RBC: 3.86 Mil/uL — ABNORMAL LOW (ref 3.87–5.11)
RDW: 13.6 % (ref 11.5–15.5)
WBC: 9.7 10*3/uL (ref 4.0–10.5)

## 2019-02-09 LAB — COMPREHENSIVE METABOLIC PANEL
ALT: 24 U/L (ref 0–35)
AST: 21 U/L (ref 0–37)
Albumin: 4 g/dL (ref 3.5–5.2)
Alkaline Phosphatase: 125 U/L — ABNORMAL HIGH (ref 39–117)
BUN: 15 mg/dL (ref 6–23)
CO2: 28 mEq/L (ref 19–32)
Calcium: 9 mg/dL (ref 8.4–10.5)
Chloride: 107 mEq/L (ref 96–112)
Creatinine, Ser: 0.79 mg/dL (ref 0.40–1.20)
GFR: 85.04 mL/min (ref >60.00)
Glucose, Bld: 97 mg/dL (ref 70–99)
Potassium: 3.5 mEq/L (ref 3.5–5.1)
Sodium: 141 mEq/L (ref 135–145)
Total Bilirubin: 0.8 mg/dL (ref 0.2–1.2)
Total Protein: 6.9 g/dL (ref 6.0–8.3)

## 2019-02-09 LAB — LIPID PANEL
Cholesterol: 123 mg/dL (ref 0–200)
HDL: 43 mg/dL (ref >39.00)
LDL Cholesterol: 66 mg/dL (ref 0–99)
NonHDL: 79.93
Total CHOL/HDL Ratio: 3
Triglycerides: 69 mg/dL (ref 0.0–149.0)
VLDL: 13.8 mg/dL (ref 0.0–40.0)

## 2019-02-09 LAB — TSH: TSH: 0.01 u[IU]/mL — ABNORMAL LOW (ref 0.35–4.50)

## 2019-02-09 LAB — HEMOGLOBIN A1C: Hgb A1c MFr Bld: 5.3 % (ref 4.6–6.5)

## 2019-02-09 MED ORDER — AMLODIPINE BESYLATE 2.5 MG PO TABS: ORAL_TABLET | ORAL | 1 refills | Status: DC

## 2019-02-09 MED ORDER — SPIRONOLACTONE 25 MG PO TABS: ORAL_TABLET | ORAL | 1 refills | Status: DC

## 2019-02-09 NOTE — Assessment & Plan Note (Signed)
Stable Encouraged elevating her legs more and increasing her activity Taking furosemide as needed CMP

## 2019-02-09 NOTE — Assessment & Plan Note (Signed)
CMP

## 2019-02-09 NOTE — Assessment & Plan Note (Signed)
Has leg edema, but denies any shortness of breath Leg edema is chronic and stable Taking furosemide as needed Advised elevating the legs more increasing exercise

## 2019-02-09 NOTE — Assessment & Plan Note (Signed)
Clinically euthyroid Check tsh  Titrate med dose if needed  

## 2019-02-09 NOTE — Assessment & Plan Note (Signed)
BP well controlled Current regimen effective and well tolerated Continue current medications at current doses CMP 

## 2019-02-09 NOTE — Assessment & Plan Note (Signed)
Irregular rhythm on exam Asymptomatic On Eliquis, metoprolol CBC, CMP

## 2019-02-09 NOTE — Assessment & Plan Note (Addendum)
Taking aspirin 81 mg, atorvastatin, Eliquis Encourage more regular exercise Blood pressure well controlled CBC, CMP

## 2019-02-09 NOTE — Assessment & Plan Note (Signed)
Check a1c Low sugar / carb diet Stressed regular exercise   

## 2019-02-09 NOTE — Assessment & Plan Note (Signed)
Check lipid panel  Continue daily statin Regular exercise and healthy diet encouraged  

## 2019-02-11 ENCOUNTER — Other Ambulatory Visit: Payer: Self-pay | Admitting: Internal Medicine

## 2019-02-11 DIAGNOSIS — E038 Other specified hypothyroidism: Secondary | ICD-10-CM

## 2019-02-11 MED ORDER — LEVOTHYROXINE SODIUM 100 MCG PO TABS: 100.0000 ug | ORAL_TABLET | Freq: Every day | ORAL | 1 refills | Status: DC

## 2019-02-17 ENCOUNTER — Other Ambulatory Visit: Payer: Self-pay | Admitting: Internal Medicine

## 2019-03-16 ENCOUNTER — Other Ambulatory Visit: Payer: Self-pay | Admitting: Internal Medicine

## 2019-03-25 ENCOUNTER — Other Ambulatory Visit: Payer: Self-pay | Admitting: Cardiovascular Disease

## 2019-03-25 DIAGNOSIS — I482 Chronic atrial fibrillation, unspecified: Secondary | ICD-10-CM

## 2019-03-25 DIAGNOSIS — I10 Essential (primary) hypertension: Secondary | ICD-10-CM

## 2019-03-25 MED ORDER — METOPROLOL SUCCINATE ER 50 MG PO TB24: 50.0000 mg | ORAL_TABLET | Freq: Every day | ORAL | 0 refills | Status: DC

## 2019-04-01 ENCOUNTER — Other Ambulatory Visit: Payer: Self-pay | Admitting: Internal Medicine

## 2019-04-08 ENCOUNTER — Other Ambulatory Visit: Payer: Self-pay | Admitting: Cardiovascular Disease

## 2019-04-08 NOTE — Telephone Encounter (Addendum)
Prescription refill request for Eliquis received.  Last office visit:  [place], 12/01/2017 Scr: 0.79, 02/10/2019 Age: 79 y.o. Weight: 92.1 kg     Pt overdue for an office visit. Called pt and spoke to pt's son, explained to him that pt was overdue for an appointment and to continue refilling medication pt needs to see cardiologist and schedule an appointment. Transferred pt to the main line to schedule an appointment. Will send in for pt to get refill, pt has appointment for 04/28/2019.

## 2019-04-27 NOTE — Progress Notes (Deleted)
Cardiology Office Note    Date:  04/27/2019   ID:  Yvonne Bailey, DOB Mar 11, 1941, MRN [id number]  PCP:  Binnie Rail, MD  Cardiologist: Lauree Chandler, MD EPS: None  No chief complaint on file.   History of Present Illness:  Yvonne Bailey is a 79 y.o. female with history of CAD anterior MI treated with PCI in 1992 with severe three-vessel disease on cath 08/2015 turned down for CABG.  She underwent DES in the proximal mid RCA, proximal circumflex, OM 2, diagonal 1.  Echo 01/2016 LVEF 40 to 45% with moderate TR, trivial MR.  CVA 02/2017 felt secondary to noncompliance with Eliquis with residual expressive aphasia, hypertension, carotid disease, HLD, PAF on Eliquis, PAD with known occlusion of the right SFA and severe stenosis in the left CFA and SFA.  Patient last saw Dr. Angelena Form 12/01/2017 and was doing well.  She had some mild lower extremity edema and asked her to take Lasix daily for several days then as needed.  Past Medical History:  Diagnosis Date  . CAD (coronary artery disease)    a. anterior MI s/p PCI in 1992. b. cath 08/2015 with severe three-vessel CAD turned down for CABG and underwent DESx5 to prox Cx/OM2/D1/oRCA/mRCA  . Carotid artery disease ([place])    a. carotid duplex 03/2015 showed 1-39% BICA, normal subclavian arteries, chronically occluded left vertebral, f/u recommended only PRN.  . DVT (deep venous thrombosis) ([place])    X1  . History of nuclear stress test    a. Myoview 6/17: EF 20-25%, mid anteroseptal, apical anterior, apical septal, apical inferior, apical lateral and apical scar, no ischemia, intermediate risk  . HTN (hypertension)   . Hyperlipidemia   . Hypokalemia   . Hypothyroidism   . Ischemic cardiomyopathy    a. Echo 6/17: EF 20-25%, apex appears akinetic, MAC, moderate MR, moderate LAE, mild RVE, trivial PI, PASP 47 mmHg (needs repeat with Definity contrast).  b. Limited echo with Definity contrast 7/17: EF 25-30%, moderate to severe  LAE. c. Limited Echo 2018 showed EF 40-45%.  . Longstanding persistent atrial fibrillation ([place])   . MI (myocardial infarction) ([place]) 1992  . PAD (peripheral artery disease) (HCC)    Right SFA occlusion, severe disease left CFA and SFA  . Stroke [facility])    a. 02/2017 in setting of noncompliance with Eliquis  . TIA (transient ischemic attack)   . Tricuspid regurgitation     Past Surgical History:  Procedure Laterality Date  . APPENDECTOMY     at hysterectomy and USO for fibroids, Dr. Ysidro Evert  . CARDIAC CATHETERIZATION  1992   Dr Eustace Quail  . CARDIAC CATHETERIZATION N/A 09/06/2015   Procedure: Right/Left Heart Cath and Coronary Angiography;  Surgeon: Burnell Blanks, MD;  Location: [place] [facility];  Service: Cardiovascular;  Laterality: N/A;  . CARDIAC CATHETERIZATION N/A 09/07/2015   Procedure: Coronary Stent Intervention;  Surgeon: Burnell Blanks, MD;  Location: [place] [facility];  Service: Cardiovascular;  Laterality: N/A;  . COLONOSCOPY     negative; 2008, Dr. Delfin Edis  . fracture LLE     '94; pinned  . IR ANGIO INTRA EXTRACRAN SEL COM CAROTID INNOMINATE BILAT MOD SED  03/02/2017  . IR ANGIO INTRA EXTRACRAN SEL COM CAROTID INNOMINATE BILAT MOD SED  04/23/2018  . IR ANGIO VERTEBRAL SEL SUBCLAVIAN INNOMINATE BILAT MOD SED  04/23/2018  . IR ANGIO VERTEBRAL SEL VERTEBRAL UNI R MOD SED  03/02/2017  . IR RADIOLOGIST EVAL & MGMT  04/28/2017  . IR US GUIDE VASC ACCESS RIGHT  04/23/2018  . RADIOLOGY WITH ANESTHESIA N/A 03/02/2017   Procedure: RADIOLOGY WITH ANESTHESIA;  Surgeon: Luanne Bras, MD;  Location: [place];  Service: Radiology;  Laterality: N/A;  . TONSILLECTOMY    . TOTAL ABDOMINAL HYSTERECTOMY     & BSO for fibroids    Current Medications: No outpatient medications have been marked as taking for the 04/28/19 encounter (Appointment) with Imogene Burn, PA-C.     Allergies:   Definity [perflutren lipid microsphere] and Cilostazol   Social History    Socioeconomic History  . Marital status: Married    Spouse name: [name]  . Number of children: 1  . Years of education: college  . Highest education level: Not on file  Occupational History  . Occupation: Pharmacist, hospital    Comment: Retired  Tobacco Use  . Smoking status: Former Smoker    Quit date: 03/11/1990    Years since quitting: 29.1  . Smokeless tobacco: Never Used  . Tobacco comment: smoked [place], up to < 5 cigarettes  Substance and Sexual Activity  . Alcohol use: No  . Drug use: No  . Sexual activity: Not Currently    Birth control/protection: Surgical  Other Topics Concern  . 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 08/31/09- designated party form signed appointing, husband Jaleisa Brose; ok to leave msg on home phone [phone].      Caffeine Small Amount one cup very rare.   Right handed.   One child- [name]   Social Determinants of Health   Financial Resource Strain:   . Difficulty of Paying Living Expenses: Not on file  Food Insecurity: No Food Insecurity  . Worried About Charity fundraiser in the Last Year: Never true  . Ran Out of Food in the Last Year: Never true  Transportation Needs: No Transportation Needs  . Lack of Transportation (Medical): No  . Lack of Transportation (Non-Medical): No  Physical Activity:   . Days of Exercise per Week: Not on file  . Minutes of Exercise per Session: Not on file  Stress:   . Feeling of Stress : Not on file  Social Connections:   . Frequency of Communication with Friends and Family: Not on file  . Frequency of Social Gatherings with Friends and Family: Not on file  . Attends Religious Services: Not on file  . Active Member of Clubs or Organizations: Not on file  . Attends Archivist Meetings: Not on file  . Marital Status: Not on file     Family History:  The patient's ***family history includes Coronary artery disease in her  brother; Diabetes in her mother; Hypertension in her mother; Stroke in her brother.   ROS:   Please see the history of present illness.    ROS All other systems reviewed and are negative.   PHYSICAL EXAM:   VS:  There were no vitals taken for this visit.  Physical Exam  GEN: Well nourished, well developed, in no acute distress  HEENT: normal  Neck: no JVD, carotid bruits, or masses Cardiac:RRR; no murmurs, rubs, or gallops  Respiratory:  clear to auscultation bilaterally, normal work of breathing GI: soft, nontender, nondistended, + BS Ext: without cyanosis, clubbing, or edema, Good distal pulses bilaterally MS: no deformity or atrophy  Skin: warm and dry, no rash Neuro:  Alert and Oriented x 3, Strength and  sensation are intact Psych: euthymic mood, full affect  Wt Readings from Last 3 Encounters:  02/09/19 203 lb (92.1 kg)  05/20/18 205 lb 6.4 oz (93.2 kg)  04/23/18 218 lb (98.9 kg)      Studies/Labs Reviewed:   EKG:  EKG is*** ordered today.  The ekg ordered today demonstrates ***  Recent Labs: 02/09/2019: ALT 24; BUN 15; Creatinine, Ser 0.79; Hemoglobin 12.1; Platelets 231.0; Potassium 3.5; Sodium 141; TSH 0.01   Lipid Panel    Component Value Date/Time   CHOL 123 02/09/2019 1625   CHOL 131 06/03/2017 0921   TRIG 69.0 02/09/2019 1625   HDL 43.00 02/09/2019 1625   HDL 40 06/03/2017 0921   CHOLHDL 3 02/09/2019 1625   VLDL 13.8 02/09/2019 1625   LDLCALC 66 02/09/2019 1625   LDLCALC 72 06/03/2017 0921    Additional studies/ records that were reviewed today include:   Echo 03/03/17: - Procedure narrative: Transthoracic echocardiography. Image   quality was suboptimal. The study was technically difficult, as a   result of poor sound wave transmission and restricted patient   mobility. Intravenous contrast (agitated saline) was   administered. - Left ventricle: There is a false tendon in the mid LV of no   clinical significance. LVF appears reduced but poor  acoustical   windows prevent accurate assessment of EF or wall motion. Tech   reported that patient has allergy to echo contrast. Bubble study   done but poor quality. Consider Cardiac MRI The cavity size was   normal. There was moderate concentric hypertrophy. Systolic   function was normal. Wall motion was normal; there were no   regional wall motion abnormalities. - Aortic valve: Trileaflet; mildly thickened, mildly calcified   leaflets. - Mitral valve: Valve area by continuity equation (using LVOT   flow): 0.86 cm^2. - Left atrium: The atrium was mildly dilated. - Pulmonary arteries: PA peak pressure: 32 mm Hg (S).      ASSESSMENT:    1. Coronary artery disease involving native coronary artery of native heart without angina pectoris   2. Chronic systolic CHF (congestive heart failure) (HCC)   3. Persistent atrial fibrillation ([place])   4. Essential hypertension   5. Hyperlipidemia, unspecified hyperlipidemia type   6. PAD (peripheral artery disease) ([place])   7. History of CVA (cerebrovascular accident)      PLAN:  In order of problems listed above:  CAD anterior MI treated with PCI in 1992 with severe three-vessel disease on cath 08/2015 turned down for CABG.  She underwent DES in the proximal mid RCA, proximal circumflex, OM 2, diagonal 1.   Chronic systolic CHF LVEF 40 to 54% 2017 poor windows and unable to visualize on echo 2018 on Lasix as needed  Persistent atrial fibrillation on Eliquis and Toprol  Essential hypertension  Hyperlipidemia on statin  PAD on aspirin and statin  History of CVA on Eliquis  Medication Adjustments/Labs and Tests Ordered: Current medicines are reviewed at length with the patient today.  Concerns regarding medicines are outlined above.  Medication changes, Labs and Tests ordered today are listed in the Patient Instructions below. There are no Patient Instructions on file for this visit.   Signed, Ermalinda Barrios, PA-C  04/27/2019 3:17 PM     [facility] [place], [place], [place]  [address] Phone: [phone]; Fax: [phone]

## 2019-04-28 ENCOUNTER — Other Ambulatory Visit: Payer: Self-pay | Admitting: Cardiovascular Disease

## 2019-04-28 ENCOUNTER — Ambulatory Visit: Payer: Medicare HMO | Admitting: Physician Assistant

## 2019-04-28 DIAGNOSIS — I482 Chronic atrial fibrillation, unspecified: Secondary | ICD-10-CM

## 2019-04-28 DIAGNOSIS — I10 Essential (primary) hypertension: Secondary | ICD-10-CM

## 2019-04-28 MED ORDER — METOPROLOL SUCCINATE ER 50 MG PO TB24: 50.0000 mg | ORAL_TABLET | Freq: Every day | ORAL | 0 refills | Status: DC

## 2019-04-28 NOTE — Telephone Encounter (Signed)
*  STAT* If patient is at the pharmacy, call can be transferred to refill team.   1. Which medications need to be refilled? (please list name of each medication and dose if known) metoprolol succinate (TOPROL-XL) 50 MG 24 hr tablet  2. Which pharmacy/location (including street and city if local pharmacy) is medication to be sent to? [id number] - [place], [place] - [address]  3. Do they need a 30 day or 90 day supply? 90

## 2019-04-28 NOTE — Telephone Encounter (Signed)
Tried to call pt back re: message for refill.  I have sent in #31 day supply of metoprolol to [facility]. Couldn't leave a message for pt.

## 2019-05-25 NOTE — Progress Notes (Deleted)
Cardiology Office Note    Date:  05/25/2019   ID:  Yvonne Bailey, DOB May 12, 1940, MRN [id number]  PCP:  Binnie Rail, MD  Cardiologist: Lauree Chandler, MD EPS: None  No chief complaint on file.   History of Present Illness:  Yvonne Bailey is a 79 y.o. female with history of CAD status post anterior MI treated with PCI in 1992, cath 08/2015 severe three-vessel CAD turned down for CABG and then underwent DES to the proximal and mid RCA, proximal circumflex, OM 2, and diagonal 1.  Echo 02/08/2016 EF 40 to 45% with moderate TR and trivial MR, HTN, PAD with occlusion of the right SFA and severe stenosis of left CFA and SFA., carotid artery disease, hyperlipidemia and paroxysmal atrial fibrillation, history of CVA 02/2017 felt secondary to noncompliance with Eliquis with resulting expressive aphasia.  She last saw Dr. Angelena Form 12/02/27 and had mild lower extremity edema because she had not been taking her Lasix as needed.  He asked her to take it for several days.   She is here for f/u.   Past Medical History:  Diagnosis Date  . CAD (coronary artery disease)    a. anterior MI s/p PCI in 1992. b. cath 08/2015 with severe three-vessel CAD turned down for CABG and underwent DESx5 to prox Cx/OM2/D1/oRCA/mRCA  . Carotid artery disease ([place])    a. carotid duplex 03/2015 showed 1-39% BICA, normal subclavian arteries, chronically occluded left vertebral, f/u recommended only PRN.  . DVT (deep venous thrombosis) ([place])    X1  . History of nuclear stress test    a. Myoview 6/17: EF 20-25%, mid anteroseptal, apical anterior, apical septal, apical inferior, apical lateral and apical scar, no ischemia, intermediate risk  . HTN (hypertension)   . Hyperlipidemia   . Hypokalemia   . Hypothyroidism   . Ischemic cardiomyopathy    a. Echo 6/17: EF 20-25%, apex appears akinetic, MAC, moderate MR, moderate LAE, mild RVE, trivial PI, PASP 47 mmHg (needs repeat with Definity contrast).  b.  Limited echo with Definity contrast 7/17: EF 25-30%, moderate to severe LAE. c. Limited Echo 2018 showed EF 40-45%.  . Longstanding persistent atrial fibrillation ([place])   . MI (myocardial infarction) ([place]) 1992  . PAD (peripheral artery disease) (HCC)    Right SFA occlusion, severe disease left CFA and SFA  . Stroke [facility])    a. 02/2017 in setting of noncompliance with Eliquis  . TIA (transient ischemic attack)   . Tricuspid regurgitation     Past Surgical History:  Procedure Laterality Date  . APPENDECTOMY     at hysterectomy and USO for fibroids, Dr. Ysidro Evert  . CARDIAC CATHETERIZATION  1992   Dr Eustace Quail  . CARDIAC CATHETERIZATION N/A 09/06/2015   Procedure: Right/Left Heart Cath and Coronary Angiography;  Surgeon: Burnell Blanks, MD;  Location: [place] [facility];  Service: Cardiovascular;  Laterality: N/A;  . CARDIAC CATHETERIZATION N/A 09/07/2015   Procedure: Coronary Stent Intervention;  Surgeon: Burnell Blanks, MD;  Location: [place] [facility];  Service: Cardiovascular;  Laterality: N/A;  . COLONOSCOPY     negative; 2008, Dr. Delfin Edis  . fracture LLE     '94; pinned  . IR ANGIO INTRA EXTRACRAN SEL COM CAROTID INNOMINATE BILAT MOD SED  03/02/2017  . IR ANGIO INTRA EXTRACRAN SEL COM CAROTID INNOMINATE BILAT MOD SED  04/23/2018  . IR ANGIO VERTEBRAL SEL SUBCLAVIAN INNOMINATE BILAT MOD SED  04/23/2018  . IR ANGIO VERTEBRAL SEL VERTEBRAL  UNI R MOD SED  03/02/2017  . IR RADIOLOGIST EVAL & MGMT  04/28/2017  . IR US GUIDE VASC ACCESS RIGHT  04/23/2018  . RADIOLOGY WITH ANESTHESIA N/A 03/02/2017   Procedure: RADIOLOGY WITH ANESTHESIA;  Surgeon: Luanne Bras, MD;  Location: [place];  Service: Radiology;  Laterality: N/A;  . TONSILLECTOMY    . TOTAL ABDOMINAL HYSTERECTOMY     & BSO for fibroids    Current Medications: No outpatient medications have been marked as taking for the 05/26/19 encounter (Appointment) with Imogene Burn, PA-C.     Allergies:    Definity [perflutren lipid microsphere] and Cilostazol   Social History   Socioeconomic History  . Marital status: Married    Spouse name: [name]  . Number of children: 1  . Years of education: college  . Highest education level: Not on file  Occupational History  . Occupation: Pharmacist, hospital    Comment: Retired  Tobacco Use  . Smoking status: Former Smoker    Quit date: 03/11/1990    Years since quitting: 29.2  . Smokeless tobacco: Never Used  . Tobacco comment: smoked [place], up to < 5 cigarettes  Substance and Sexual Activity  . Alcohol use: No  . Drug use: No  . Sexual activity: Not Currently    Birth control/protection: Surgical  Other Topics Concern  . 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 08/31/09- designated party form signed appointing, husband Margaretha Mahan; ok to leave msg on home phone [phone].      Caffeine Small Amount one cup very rare.   Right handed.   One child- [name]   Social Determinants of Health   Financial Resource Strain:   . Difficulty of Paying Living Expenses:   Food Insecurity:   . Worried About Charity fundraiser in the Last Year:   . Arboriculturist in the Last Year:   Transportation Needs:   . Film/video editor (Medical):   Marland Kitchen Lack of Transportation (Non-Medical):   Physical Activity:   . Days of Exercise per Week:   . Minutes of Exercise per Session:   Stress:   . Feeling of Stress :   Social Connections:   . Frequency of Communication with Friends and Family:   . Frequency of Social Gatherings with Friends and Family:   . Attends Religious Services:   . Active Member of Clubs or Organizations:   . Attends Archivist Meetings:   Marland Kitchen Marital Status:      Family History:  The patient's ***family history includes Coronary artery disease in her brother; Diabetes in her mother; Hypertension in her mother; Stroke in her brother.   ROS:     Please see the history of present illness.    ROS All other systems reviewed and are negative.   PHYSICAL EXAM:   VS:  There were no vitals taken for this visit.  Physical Exam  GEN: Well nourished, well developed, in no acute distress  HEENT: normal  Neck: no JVD, carotid bruits, or masses Cardiac:RRR; no murmurs, rubs, or gallops  Respiratory:  clear to auscultation bilaterally, normal work of breathing GI: soft, nontender, nondistended, + BS Ext: without cyanosis, clubbing, or edema, Good distal pulses bilaterally MS: no deformity or atrophy  Skin: warm and dry, no rash Neuro:  Alert and Oriented x 3, Strength and sensation are intact Psych: euthymic mood, full affect  Wt Readings  from Last 3 Encounters:  02/09/19 203 lb (92.1 kg)  05/20/18 205 lb 6.4 oz (93.2 kg)  04/23/18 218 lb (98.9 kg)      Studies/Labs Reviewed:   EKG:  EKG is*** ordered today.  The ekg ordered today demonstrates ***  Recent Labs: 02/09/2019: ALT 24; BUN 15; Creatinine, Ser 0.79; Hemoglobin 12.1; Platelets 231.0; Potassium 3.5; Sodium 141; TSH 0.01   Lipid Panel    Component Value Date/Time   CHOL 123 02/09/2019 1625   CHOL 131 06/03/2017 0921   TRIG 69.0 02/09/2019 1625   HDL 43.00 02/09/2019 1625   HDL 40 06/03/2017 0921   CHOLHDL 3 02/09/2019 1625   VLDL 13.8 02/09/2019 1625   LDLCALC 66 02/09/2019 1625   LDLCALC 72 06/03/2017 0921    Additional studies/ records that were reviewed today include:  2D echo 12/24/2018Study Conclusions   - Procedure narrative: Transthoracic echocardiography. Image    quality was suboptimal. The study was technically difficult, as a    result of poor sound wave transmission and restricted patient    mobility. Intravenous contrast (agitated saline) was    administered.  - Left ventricle: There is a false tendon in the mid LV of no    clinical significance. LVF appears reduced but poor acoustical    windows prevent accurate assessment of EF or wall  motion. Tech    reported that patient has allergy to echo contrast. Bubble study    done but poor quality. Consider Cardiac MRI The cavity size was    normal. There was moderate concentric hypertrophy. Systolic    function was normal. Wall motion was normal; there were no    regional wall motion abnormalities.  - Aortic valve: Trileaflet; mildly thickened, mildly calcified    leaflets.  - Mitral valve: Valve area by continuity equation (using LVOT    flow): 0.86 cm^2.  - Left atrium: The atrium was mildly dilated.  - Pulmonary arteries: PA peak pressure: 32 mm Hg (S).   Recommendations:  Consider cardiac MRI to assess EF and wall  motion.   PCI 09/07/2015 1. Triple vessel CAD 2. Severe stenosis proximal Circumflex, s/p successful PTCA/DES x 1.  3. Severe stenosis second obtuse marginal, s/p successful PTCA/DES x 1.  4. Severe stenosis diagonal 1, s/p successful PTCA/DES x 1.  5. Severe stenosis ostial RCA, s/p successful PTCA/DES x 1.  6. Severe stenosis mid RCA, s/p successful PTCA/DES x 1.    Recommendations: Will continue ASA and Plavix along with Eliquis for one month. Stop ASA in one month. Will treat with IV Lasix tonight for volume overload post cath. Continue statin and beta blocker.   Indications     ASSESSMENT:    No diagnosis found.   PLAN:  In order of problems listed above:  CAD with triple-vessel disease 08/2015 not felt to be candidate for CABG.  Stents placed in the ostial and mid RCA, proximal circumflex, OM 2 and first diagonal on aspirin, statin and beta-blocker  Ischemic cardiomyopathy LV function improved post PCI is up to 45% on echo in 2019 on Toprol Lotensin and Aldactone  Chronic systolic CHF  Persistent atrial fibrillation on Toprol and Eliquis    history of CVA secondary to noncompliance with Eliquis  Essential hypertension  Hyperlipidemia  PAD on aspirin and statin  Medication Adjustments/Labs and Tests Ordered: Current medicines are  reviewed at length with the patient today.  Concerns regarding medicines are outlined above.  Medication changes, Labs and Tests ordered today are listed  in the Patient Instructions below. There are no Patient Instructions on file for this visit.   Signed, Ermalinda Barrios, PA-C  05/25/2019 3:17 PM    [facility] [place], [place],   [address] Phone: [phone]; Fax: [phone]

## 2019-05-26 ENCOUNTER — Ambulatory Visit: Payer: Medicare HMO | Admitting: Physician Assistant

## 2019-05-26 ENCOUNTER — Other Ambulatory Visit: Payer: Self-pay | Admitting: *Deleted

## 2019-05-26 DIAGNOSIS — I482 Chronic atrial fibrillation, unspecified: Secondary | ICD-10-CM

## 2019-05-26 DIAGNOSIS — I10 Essential (primary) hypertension: Secondary | ICD-10-CM

## 2019-05-26 MED ORDER — METOPROLOL SUCCINATE ER 50 MG PO TB24: 50.0000 mg | ORAL_TABLET | Freq: Every day | ORAL | 0 refills | Status: DC

## 2019-06-07 ENCOUNTER — Other Ambulatory Visit: Payer: Self-pay | Admitting: Cardiovascular Disease

## 2019-06-07 NOTE — Telephone Encounter (Signed)
Last seen > 1 year ago. Has appointment on 4/20 Scr 0.79 on 02/09/2019 79 years old 92kg Eliquis 5mg  BID sent to pharmacy for 90 ds

## 2019-06-24 ENCOUNTER — Other Ambulatory Visit: Payer: Self-pay

## 2019-06-24 DIAGNOSIS — I10 Essential (primary) hypertension: Secondary | ICD-10-CM

## 2019-06-24 DIAGNOSIS — I482 Chronic atrial fibrillation, unspecified: Secondary | ICD-10-CM

## 2019-06-24 MED ORDER — METOPROLOL SUCCINATE ER 50 MG PO TB24: 50.0000 mg | ORAL_TABLET | Freq: Every day | ORAL | 0 refills | Status: DC

## 2019-06-27 ENCOUNTER — Other Ambulatory Visit: Payer: Self-pay | Admitting: Internal Medicine

## 2019-06-28 NOTE — Progress Notes (Signed)
Cardiology Office Note    Date:  06/29/2019   ID:  Yvonne Bailey, DOB 06/12/1940, MRN [id number]  PCP:  Binnie Rail, MD  Cardiologist: Lauree Chandler, MD EPS: None  Chief Complaint  Patient presents with  . Follow-up    History of Present Illness:  Yvonne Bailey is a 79 y.o. female with history of CAD status post anterior wall MI in 1992 treated with PCI.  Ejection fraction in 2007 was 45%, cath in 2017 severe three-vessel CAD turned down for CABG so underwent DES to the proximal and mid RCA and proximal circumflex, OM 2, diagonal 1 08/2015 LVEF 40 to 45% on echo 01/2016, also has PAF on Eliquis and had a CVA in 2018 due to noncompliance with Eliquis, hypertension, carotid artery disease, PAD, HLD.  Last saw Dr. Angelena Form 12/01/2017 at which she had some edema due to not taking Lasix for several days.  She was asked to take it daily.  Otherwise she was stable.  Patient comes in for f/u with granddaughter who stays with her. Denies chest pain, palpitations, dyspnea, dyspnea on exertion. No regular exercise because of knee trouble. Has had some swelling this week-hasn't used lasix for at least 4 months. No bleeding problems.  Past Medical History:  Diagnosis Date  . CAD (coronary artery disease)    a. anterior MI s/p PCI in 1992. b. cath 08/2015 with severe three-vessel CAD turned down for CABG and underwent DESx5 to prox Cx/OM2/D1/oRCA/mRCA  . Carotid artery disease ([place])    a. carotid duplex 03/2015 showed 1-39% BICA, normal subclavian arteries, chronically occluded left vertebral, f/u recommended only PRN.  . DVT (deep venous thrombosis) ([place])    X1  . History of nuclear stress test    a. Myoview 6/17: EF 20-25%, mid anteroseptal, apical anterior, apical septal, apical inferior, apical lateral and apical scar, no ischemia, intermediate risk  . HTN (hypertension)   . Hyperlipidemia   . Hypokalemia   . Hypothyroidism   . Ischemic cardiomyopathy    a. Echo 6/17: EF  20-25%, apex appears akinetic, MAC, moderate MR, moderate LAE, mild RVE, trivial PI, PASP 47 mmHg (needs repeat with Definity contrast).  b. Limited echo with Definity contrast 7/17: EF 25-30%, moderate to severe LAE. c. Limited Echo 2018 showed EF 40-45%.  . Longstanding persistent atrial fibrillation ([place])   . MI (myocardial infarction) ([place]) 1992  . PAD (peripheral artery disease) (H[facility])    Right SFA occlusion, severe disease left CFA and SFA  . Stroke [facility])    a. 02/2017 in setting of noncompliance with Eliquis  . TIA (transient ischemic attack)   . Tricuspid regurgitation     Past Surgical History:  Procedure Laterality Date  . APPENDECTOMY     at hysterectomy and USO for fibroids, Dr. Ysidro Evert  . CARDIAC CATHETERIZATION  1992   Dr Eustace Quail  . CARDIAC CATHETERIZATION N/A 09/06/2015   Procedure: Right/Left Heart Cath and Coronary Angiography;  Surgeon: Burnell Blanks, MD;  Location: [place] [facility];  Service: Cardiovascular;  Laterality: N/A;  . CARDIAC CATHETERIZATION N/A 09/07/2015   Procedure: Coronary Stent Intervention;  Surgeon: Burnell Blanks, MD;  Location: [place] [facility];  Service: Cardiovascular;  Laterality: N/A;  . COLONOSCOPY     negative; 2008, Dr. Delfin Edis  . fracture LLE     '94; pinned  . IR ANGIO INTRA EXTRACRAN SEL COM CAROTID INNOMINATE BILAT MOD SED  03/02/2017  . IR ANGIO INTRA EXTRACRAN SEL COM  CAROTID INNOMINATE BILAT MOD SED  04/23/2018  . IR ANGIO VERTEBRAL SEL SUBCLAVIAN INNOMINATE BILAT MOD SED  04/23/2018  . IR ANGIO VERTEBRAL SEL VERTEBRAL UNI R MOD SED  03/02/2017  . IR RADIOLOGIST EVAL & MGMT  04/28/2017  . IR US GUIDE VASC ACCESS RIGHT  04/23/2018  . RADIOLOGY WITH ANESTHESIA N/A 03/02/2017   Procedure: RADIOLOGY WITH ANESTHESIA;  Surgeon: Luanne Bras, MD;  Location: [place];  Service: Radiology;  Laterality: N/A;  . TONSILLECTOMY    . TOTAL ABDOMINAL HYSTERECTOMY     & BSO for fibroids    Current  Medications: Current Meds  Medication Sig  . amLODipine (NORVASC) 2.5 MG tablet TAKE 1 TABLET(2.5 MG) BY MOUTH DAILY  . aspirin EC 81 MG tablet Take 1 tablet (81 mg total) by mouth daily.  Marland Kitchen atorvastatin (LIPITOR) 80 MG tablet TAKE 1 TABLET(80 MG) BY MOUTH DAILY  . benazepril (LOTENSIN) 20 MG tablet TAKE 1 AND 1/2 TABLETS(30 MG) BY MOUTH DAILY  . brimonidine (ALPHAGAN) 0.2 % ophthalmic solution Place 1 drop into both eyes 3 (three) times daily.   . dorzolamide-timolol (COSOPT) 22.3-6.8 MG/ML ophthalmic solution Place 1 drop into both eyes 2 (two) times daily.   Marland Kitchen ELIQUIS 5 MG TABS tablet TAKE 1 TABLET TWICE DAILY  . levothyroxine (SYNTHROID) 100 MCG tablet Take 1 tablet (100 mcg total) by mouth daily.  . metoprolol succinate (TOPROL-XL) 50 MG 24 hr tablet Take 1 tablet (50 mg total) by mouth daily. Pt must keep upcoming appt in April with Doctor before anymore refills. Final Attempt  . nitroGLYCERIN (NITROSTAT) 0.4 MG SL tablet Place 1 tablet (0.4 mg total) under the tongue every 5 (five) minutes as needed for chest pain.  Marland Kitchen spironolactone (ALDACTONE) 25 MG tablet TAKE 1 TABLET(25 MG) BY MOUTH DAILY  . travoprost, benzalkonium, (TRAVATAN) 0.004 % ophthalmic solution Place 1 drop into both eyes at bedtime.  . [DISCONTINUED] furosemide (LASIX) 40 MG tablet Take one tablet by mouth daily as needed for swelling/weight gain     Allergies:   Definity [perflutren lipid microsphere] and Cilostazol   Social History   Socioeconomic History  . Marital status: Married    Spouse name: [name]  . Number of children: 1  . Years of education: college  . Highest education level: Not on file  Occupational History  . Occupation: Pharmacist, hospital    Comment: Retired  Tobacco Use  . Smoking status: Former Smoker    Quit date: 03/11/1990    Years since quitting: 29.3  . Smokeless tobacco: Never Used  . Tobacco comment: smoked [place], up to < 5 cigarettes  Substance and Sexual Activity  . Alcohol use: No  .  Drug use: No  . Sexual activity: Not Currently    Birth control/protection: Surgical  Other Topics Concern  . 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 08/31/09- designated party form signed appointing, husband Nadirah Socorro; ok to leave msg on home phone [phone].      Caffeine Small Amount one cup very rare.   Right handed.   One child- [name]   Social Determinants of Health   Financial Resource Strain:   . Difficulty of Paying Living Expenses:   Food Insecurity:   . Worried About Charity fundraiser in the Last Year:   . Arboriculturist in the Last Year:   Transportation Needs:   . Film/video editor (Medical):   Marland Kitchen  Lack of Transportation (Non-Medical):   Physical Activity:   . Days of Exercise per Week:   . Minutes of Exercise per Session:   Stress:   . Feeling of Stress :   Social Connections:   . Frequency of Communication with Friends and Family:   . Frequency of Social Gatherings with Friends and Family:   . Attends Religious Services:   . Active Member of Clubs or Organizations:   . Attends Archivist Meetings:   Marland Kitchen Marital Status:      Family History:  The patient's family history includes Coronary artery disease in her brother; Diabetes in her mother; Hypertension in her mother; Stroke in her brother.   ROS:   Please see the history of present illness.    ROS All other systems reviewed and are negative.   PHYSICAL EXAM:   VS:  BP (!) 142/82   Pulse 73   Ht _0  (1.676 m)   Wt 199 lb (90.3 kg)   SpO2 99%   BMI 32.12 kg/m   Physical Exam  GEN: Well nourished, well developed, in no acute distress  Neck: no JVD, carotid bruits, or masses Cardiac:RRR; positive S4, 2/6 systolic murmur at the left sternal border respiratory:  clear to auscultation bilaterally, normal work of breathing GI: soft, nontender, nondistended, + BS Ext: +2 edema bilaterally without cyanosis,  clubbing, Good distal pulses bilaterally Neuro:  Alert and Oriented x 3 Psych: euthymic mood, full affect  Wt Readings from Last 3 Encounters:  06/29/19 199 lb (90.3 kg)  02/09/19 203 lb (92.1 kg)  05/20/18 205 lb 6.4 oz (93.2 kg)      Studies/Labs Reviewed:   EKG:  EKG is ordered today.  The ekg ordered today demonstrates normal sinus rhythm with nonspecific ST-T wave changes, no acute change  Recent Labs: 02/09/2019: ALT 24; BUN 15; Creatinine, Ser 0.79; Hemoglobin 12.1; Platelets 231.0; Potassium 3.5; Sodium 141; TSH 0.01   Lipid Panel    Component Value Date/Time   CHOL 123 02/09/2019 1625   CHOL 131 06/03/2017 0921   TRIG 69.0 02/09/2019 1625   HDL 43.00 02/09/2019 1625   HDL 40 06/03/2017 0921   CHOLHDL 3 02/09/2019 1625   VLDL 13.8 02/09/2019 1625   LDLCALC 66 02/09/2019 1625   LDLCALC 72 06/03/2017 0921    Additional studies/ records that were reviewed today include:  Echo 03/03/17: - Procedure narrative: Transthoracic echocardiography. Image   quality was suboptimal. The study was technically difficult, as a   result of poor sound wave transmission and restricted patient   mobility. Intravenous contrast (agitated saline) was   administered. - Left ventricle: There is a false tendon in the mid LV of no   clinical significance. LVF appears reduced but poor acoustical   windows prevent accurate assessment of EF or wall motion. Tech   reported that patient has allergy to echo contrast. Bubble study   done but poor quality. Consider Cardiac MRI The cavity size was   normal. There was moderate concentric hypertrophy. Systolic   function was normal. Wall motion was normal; there were no   regional wall motion abnormalities. - Aortic valve: Trileaflet; mildly thickened, mildly calcified   leaflets. - Mitral valve: Valve area by continuity equation (using LVOT   flow): 0.86 cm^2. - Left atrium: The atrium was mildly dilated. - Pulmonary arteries: PA peak pressure:  32 mm Hg (S).       ASSESSMENT:    1. Coronary artery disease involving native coronary  artery of native heart without angina pectoris   2. Ischemic cardiomyopathy   3. Chronic systolic CHF (congestive heart failure) (HCC)   4. Paroxysmal atrial fibrillation ([place])   5. History of CVA (cerebrovascular accident)   6. Essential hypertension   7. Hyperlipidemia, unspecified hyperlipidemia type   8. PAD (peripheral artery disease) (HCC)      PLAN:  In order of problems listed above:  CAD status post DES to the ostial mid RCA, proximal circumflex, OM 2, first diagonal 08/2015 after she was felt not to be a candidate for CABG.  On aspirin, statin and beta-blocker-no angina.  Labs stable in December  Ischemic cardiomyopathy ejection fraction 45% on echo in 2018 on Lotensin Aldactone and Toprol  Acute on chronic systolic CHF patient has been out of Lasix since December.  Her granddaughter says her legs just started to swell on last week.  No extra sodium in her diet.  We will refill Lasix 40 mg once daily for 4 days then half tablet daily.  Follow-up with me in 1 month.  Persistent atrial fibrillation on Eliquis and Toprol CBC and be met stable in December.  We will repeat check at follow-up.  History of CVA when not taking her Eliquis properly  Hypertension blood pressure controlled  Hyperlipidemia lipids checked by PCP 02/2019 and LDL was 66 on Lipitor  PAD    Medication Adjustments/Labs and Tests Ordered: Current medicines are reviewed at length with the patient today.  Concerns regarding medicines are outlined above.  Medication changes, Labs and Tests ordered today are listed in the Patient Instructions below. Patient Instructions   Medication Instructions:  Start Furosemide (Lasix) 58m daily for 4 days then take 1/2 tablet daily  *If you need a refill on your cardiac medications before your next appointment, please call your pharmacy*   Lab Work: None today  If you  have labs (blood work) drawn today and your tests are completely normal, you will receive your results only by: .Marland KitchenMyChart Message (if you have MyChart) OR . A paper copy in the mail If you have any lab test that is abnormal or we need to change your treatment, we will call you to review the results.   Testing/Procedures: None   Follow-Up: Follow up with MErmalinda Barrios PA on 08/03/19 at 1:00 PM  Other Instructions  We are recommending the COVID-19 vaccine to all of our patients. Cardiac medications (including blood thinners) should not deter anyone from being vaccinated and there is no need to hold any of those medications prior to vaccine administration.   Currently, there is a hotline to call (active 03/19/19) to schedule vaccination appointments as no walk-ins will be accepted.    Vaccines through the health department can be arranged by calling [phone]   Vaccines through [facility] can be arranged by calling [phone] visiting [email or web address]  If you have further questions or concerns about the vaccine process, please visit [email or web address] or contact your primary care physician.     Two Gram Sodium Diet 2000 mg  What is Sodium? Sodium is a mineral found naturally in many foods. The most significant source of sodium in the diet is table salt, which is about 40% sodium.  Processed, convenience, and preserved foods also contain a large amount of sodium.  The body needs only 500 mg of sodium daily to function,  A normal diet provides more than enough sodium even if you do not use salt.  Why Limit Sodium? A build  up of sodium in the body can cause thirst, increased blood pressure, shortness of breath, and water retention.  Decreasing sodium in the diet can reduce edema and risk of heart attack or stroke associated with high blood pressure.  Keep in mind that there are many other factors involved in these health problems.   Heredity, obesity, lack of exercise, cigarette smoking, stress and what you eat all play a role.  General Guidelines:  Do not add salt at the table or in cooking.  One teaspoon of salt contains over 2 grams of sodium.  Read food labels  Avoid processed and convenience foods  Ask your dietitian before eating any foods not dicussed in the menu planning guidelines  Consult your physician if you wish to use a salt substitute or a sodium containing medication such as antacids.  Limit milk and milk products to 16 oz (2 cups) per day.  Shopping Hints:  READ LABELS!! "Dietetic" does not necessarily mean low sodium.  Salt and other sodium ingredients are often added to foods during processing.   Menu Planning Guidelines Food Group Choose More Often Avoid  Beverages (see also the milk group All fruit juices, low-sodium, salt-free vegetables juices, low-sodium carbonated beverages Regular vegetable or tomato juices, commercially softened water used for drinking or cooking  Breads and Cereals Enriched white, wheat, rye and pumpernickel bread, hard rolls and dinner rolls; muffins, cornbread and waffles; most dry cereals, cooked cereal without added salt; unsalted crackers and breadsticks; low sodium or homemade bread crumbs Bread, rolls and crackers with salted tops; quick breads; instant hot cereals; pancakes; commercial bread stuffing; self-rising flower and biscuit mixes; regular bread crumbs or cracker crumbs  Desserts and Sweets Desserts and sweets mad with mild should be within allowance Instant pudding mixes and cake mixes  Fats Butter or margarine; vegetable oils; unsalted salad dressings, regular salad dressings limited to 1 Tbs; light, sour and heavy cream Regular salad dressings containing bacon fat, bacon bits, and salt pork; snack dips made with instant soup mixes or processed cheese; salted nuts  Fruits Most fresh, frozen and canned fruits Fruits processed with salt or sodium-containing  ingredient (some dried fruits are processed with sodium sulfites        Vegetables Fresh, frozen vegetables and low- sodium canned vegetables Regular canned vegetables, sauerkraut, pickled vegetables, and others prepared in brine; frozen vegetables in sauces; vegetables seasoned with ham, bacon or salt pork  Condiments, Sauces, Miscellaneous  Salt substitute with physician's approval; pepper, herbs, spices; vinegar, lemon or lime juice; hot pepper sauce; garlic powder, onion powder, low sodium soy sauce (1 Tbs.); low sodium condiments (ketchup, chili sauce, mustard) in limited amounts (1 tsp.) fresh ground horseradish; unsalted tortilla chips, pretzels, potato chips, popcorn, salsa (1/4 cup) Any seasoning made with salt including garlic salt, celery salt, onion salt, and seasoned salt; sea salt, rock salt, kosher salt; meat tenderizers; monosodium glutamate; mustard, regular soy sauce, barbecue, sauce, chili sauce, teriyaki sauce, steak sauce, Worcestershire sauce, and most flavored vinegars; canned gravy and mixes; regular condiments; salted snack foods, olives, picles, relish, horseradish sauce, catsup   Food preparation: Try these seasonings Meats:    Pork Sage, onion Serve with applesauce  Chicken Poultry seasoning, thyme, parsley Serve with cranberry sauce  Lamb Curry powder, rosemary, garlic, thyme Serve with mint sauce or jelly  Veal Marjoram, basil Serve with current jelly, cranberry sauce  Beef Pepper, bay leaf Serve with dry mustard, unsalted chive butter  Fish [place] leaf, dill Serve with unsalted lemon  butter, unsalted parsley butter  Vegetables:    Asparagus Lemon juice   Broccoli Lemon juice   Carrots Mustard dressing parsley, mint, nutmeg, glazed with unsalted butter and sugar   Green beans Marjoram, lemon juice, nutmeg,dill seed   Tomatoes Basil, marjoram, onion   Spice /blend for [employer]" 4 tsp ground thyme 1 tsp ground sage 3 tsp ground rosemary 4 tsp ground marjoram    Test your knowledge 1. A product that says "Salt Free" may still contain sodium. True or False 2. Garlic Powder and Hot Pepper Sauce an be used as alternative seasonings.True or False 3. Processed foods have more sodium than fresh foods.  True or False 4. Canned Vegetables have less sodium than froze True or False  WAYS TO DECREASE YOUR SODIUM INTAKE 1. Avoid the use of added salt in cooking and at the table.  Table salt (and other prepared seasonings which contain salt) is probably one of the greatest sources of sodium in the diet.  Unsalted foods can gain flavor from the sweet, sour, and butter taste sensations of herbs and spices.  Instead of using salt for seasoning, try the following seasonings with the foods listed.  Remember: how you use them to enhance natural food flavors is limited only by your creativity... Allspice-Meat, fish, eggs, fruit, peas, red and yellow vegetables Almond Extract-Fruit baked goods Anise Seed-Sweet breads, fruit, carrots, beets, cottage cheese, cookies (tastes like licorice) Basil-Meat, fish, eggs, vegetables, rice, vegetables salads, soups, sauces Bay Leaf-Meat, fish, stews, poultry Burnet-Salad, vegetables (cucumber-like flavor) Caraway Seed-Bread, cookies, cottage cheese, meat, vegetables, cheese, rice Cardamon-Baked goods, fruit, soups Celery Powder or seed-Salads, salad dressings, sauces, meatloaf, soup, bread.Do not use  celery salt Chervil-Meats, salads, fish, eggs, vegetables, cottage cheese (parsley-like flavor) Chili Power-Meatloaf, chicken cheese, corn, eggplant, egg dishes Chives-Salads cottage cheese, egg dishes, soups, vegetables, sauces Cilantro-Salsa, casseroles Cinnamon-Baked goods, fruit, pork, lamb, chicken, carrots Cloves-Fruit, baked goods, fish, pot roast, green beans, beets, carrots Coriander-Pastry, cookies, meat, salads, cheese (lemon-orange flavor) Cumin-Meatloaf, fish,cheese, eggs, cabbage,fruit pie (caraway flavor) [employer], fruit, eggs, fish, poultry, cottage cheese, vegetables Dill Seed-Meat, cottage cheese, poultry, vegetables, fish, salads, bread Fennel Seed-Bread, cookies, apples, pork, eggs, fish, beets, cabbage, cheese, Licorice-like flavor Garlic-(buds or powder) Salads, meat, poultry, fish, bread, butter, vegetables, potatoes.Do not  use garlic salt Ginger-Fruit, vegetables, baked goods, meat, fish, poultry Horseradish Root-Meet, vegetables, butter Lemon Juice or Extract-Vegetables, fruit, tea, baked goods, fish salads Mace-Baked goods fruit, vegetables, fish, poultry (taste like nutmeg) Maple Extract-Syrups Marjoram-Meat, chicken, fish, vegetables, breads, green salads (taste like Sage) Mint-Tea, lamb, sherbet, vegetables, desserts, carrots, cabbage Mustard, Dry or Seed-Cheese, eggs, meats, vegetables, poultry Nutmeg-Baked goods, fruit, chicken, eggs, vegetables, desserts Onion Powder-Meat, fish, poultry, vegetables, cheese, eggs, bread, rice salads (Do not use   Onion salt) Orange Extract-Desserts, baked goods Oregano-Pasta, eggs, cheese, onions, pork, lamb, fish, chicken, vegetables, green salads Paprika-Meat, fish, poultry, eggs, cheese, vegetables Parsley Flakes-Butter, vegetables, meat fish, poultry, eggs, bread, salads (certain forms may   Contain sodium Pepper-Meat fish, poultry, vegetables, eggs Peppermint Extract-Desserts, baked goods Poppy Seed-Eggs, bread, cheese, fruit dressings, baked goods, noodles, vegetables, cottage  [employer], poultry, meat, fish, cauliflower, turnips,eggs bread Saffron-Rice, bread, veal, chicken, fish, eggs Sage-Meat, fish, poultry, onions, eggplant, tomateos, pork, stews Savory-Eggs, salads, poultry, meat, rice, vegetables, soups, pork Tarragon-Meat, poultry, fish, eggs, butter, vegetables (licorice-like flavor)  Thyme-Meat, poultry, fish, eggs, vegetables, (clover-like flavor), sauces,  soups Tumeric-Salads, butter, eggs, fish, rice, vegetables (saffron-like flavor) Vanilla Extract-Baked goods, candy Vinegar-Salads, vegetables,  meat marinades Walnut Extract-baked goods, candy  2. Choose your Foods Wisely   The following is a list of foods to avoid which are high in sodium:  Meats-Avoid all smoked, canned, salt cured, dried and kosher meat and fish as well as Anchovies   [employer] meats:Bologna, Liverwurst, Pastrami Canned meat or fish  Marinated herring Caviar    Pepperoni Corned Beef   Pizza Dried chipped beef  Salami Frozen breaded fish or meat Salt pork Frankfurters or hot dogs  Sardines Gefilte fish   Sausage Ham (boiled ham, Proscuitto Smoked butt    spiced ham)   Spam      TV Dinners Vegetables Canned vegetables (Regular) Relish Canned mushrooms  Sauerkraut Olives    Tomato juice Pickles  Bakery and Dessert Products Canned puddings  Cream pies Cheesecake   Decorated cakes Cookies  Beverages/Juices Tomato juice, regular  Gatorade   V-8 vegetable juice, regular  Breads and Cereals Biscuit mixes   Salted potato chips, corn chips, pretzels Bread stuffing mixes  Salted crackers and rolls Pancake and waffle mixes Self-rising flour  Seasonings Accent    Meat sauces Barbecue sauce  Meat tenderizer Catsup    Monosodium glutamate (MSG) Celery salt   Onion salt Chili sauce   Prepared mustard Garlic salt   Salt, seasoned salt, sea salt Gravy mixes   Soy sauce Horseradish   Steak sauce Ketchup   Tartar sauce Lite salt    Teriyaki sauce Marinade mixes   Worcestershire sauce  Others Baking powder   Cocoa and cocoa mixes Baking soda   Commercial casserole mixes Candy-caramels, chocolate  Dehydrated soups    Bars, fudge,nougats  Instant rice and pasta mixes Canned broth or soup  Maraschino cherries Cheese, aged and processed cheese and cheese spreads  Learning Assessment Quiz  Indicated T (for True) or F (for False) for each of the  following statements:  1. _____ Fresh fruits and vegetables and unprocessed grains are generally low in sodium 2. _____ Water may contain a considerable amount of sodium, depending on the source 3. _____ You can always tell if a food is high in sodium by tasting it 4. _____ Certain laxatives my be high in sodium and should be avoided unless prescribed   by a physician or pharmacist 5. _____ Salt substitutes may be used freely by anyone on a sodium restricted diet 6. _____ Sodium is present in table salt, food additives and as a natural component of   most foods 7. _____ Table salt is approximately 90% sodium 8. _____ Limiting sodium intake may help prevent excess fluid accumulation in the body 9. _____ On a sodium-restricted diet, seasonings such as bouillon soy sauce, and    cooking wine should be used in place of table salt 10. _____ On an ingredient list, a product which lists monosodium glutamate as the first   ingredient is an appropriate food to include on a low sodium diet  Circle the best answer(s) to the following statements (Hint: there may be more than one correct answer)  11. On a low-sodium diet, some acceptable snack items are:    A. Olives  F. Bean dip   K. Grapefruit juice    B. Salted Pretzels G. Commercial Popcorn   L. Canned peaches    C. Carrot Sticks  H. Bouillon   M. Unsalted nuts   D. [place] fries  I. Peanut butter crackers N. Salami   E. Sweet pickles J. Tomato Juice  O. Pizza  12.  Seasonings that may be used freely on a reduced - sodium diet include   A. Lemon wedges F.Monosodium glutamate K. Celery seed    B.Soysauce   G. Pepper   L. Mustard powder   C. Sea salt  H. Cooking wine  M. Onion flakes   D. Vinegar  E. Prepared horseradish N. Salsa   E. Sage   J. Worcestershire sauce  O. [address]      Sumner Boast, PA-C  06/29/2019 2:05 PM    [facility] [place], [place], [place]  [address] Phone: [phone];  Fax: [phone]

## 2019-06-29 ENCOUNTER — Encounter: Payer: Self-pay | Admitting: Physician Assistant

## 2019-06-29 ENCOUNTER — Telehealth: Payer: Self-pay | Admitting: Physician Assistant

## 2019-06-29 ENCOUNTER — Ambulatory Visit: Payer: Medicare HMO | Admitting: Physician Assistant

## 2019-06-29 ENCOUNTER — Other Ambulatory Visit: Payer: Self-pay

## 2019-06-29 VITALS — BP 142/82 | HR 73 | Ht 66.0 in | Wt 199.0 lb

## 2019-06-29 DIAGNOSIS — Z8673 Personal history of transient ischemic attack (TIA), and cerebral infarction without residual deficits: Secondary | ICD-10-CM | POA: Diagnosis not present

## 2019-06-29 DIAGNOSIS — I10 Essential (primary) hypertension: Secondary | ICD-10-CM | POA: Diagnosis not present

## 2019-06-29 DIAGNOSIS — I739 Peripheral vascular disease, unspecified: Secondary | ICD-10-CM

## 2019-06-29 DIAGNOSIS — I2510 Atherosclerotic heart disease of native coronary artery without angina pectoris: Secondary | ICD-10-CM | POA: Diagnosis not present

## 2019-06-29 DIAGNOSIS — I5022 Chronic systolic (congestive) heart failure: Secondary | ICD-10-CM

## 2019-06-29 DIAGNOSIS — E785 Hyperlipidemia, unspecified: Secondary | ICD-10-CM | POA: Diagnosis not present

## 2019-06-29 DIAGNOSIS — I255 Ischemic cardiomyopathy: Secondary | ICD-10-CM | POA: Diagnosis not present

## 2019-06-29 DIAGNOSIS — I480 Paroxysmal atrial fibrillation: Secondary | ICD-10-CM | POA: Diagnosis not present

## 2019-06-29 MED ORDER — FUROSEMIDE 40 MG PO TABS: ORAL_TABLET | ORAL | 3 refills | Status: DC

## 2019-06-29 NOTE — Telephone Encounter (Signed)
Called and spoke to patient. She states that her granddaughter helps her remember things and helps her with her medication. Made her aware that she can come up with the patient.

## 2019-06-29 NOTE — Telephone Encounter (Signed)
New Message    Pt is calling and says she needs her grand daughter to assist her to her appt because she handles her medications   Please advise

## 2019-06-29 NOTE — Patient Instructions (Addendum)
Medication Instructions:  Start Furosemide (Lasix) 40mg  daily for 4 days then take 1/2 tablet daily  *If you need a refill on your cardiac medications before your next appointment, please call your pharmacy*   Lab Work: None today  If you have labs (blood work) drawn today and your tests are completely normal, you will receive your results only by: MyChart Message (if you have MyChart) OR . A paper copy in the mail If you have any lab test that is abnormal or we need to change your treatment, we will call you to review the results.   Testing/Procedures: None   Follow-Up: Follow up with Marland Kitchen, PA on 08/03/19 at 1:00 PM  Other Instructions  We are recommending the COVID-19 vaccine to all of our patients. Cardiac medications (including blood thinners) should not deter anyone from being vaccinated and there is no need to hold any of those medications prior to vaccine administration.   Currently, there is a hotline to call (active 03/19/19) to schedule vaccination appointments as no walk-ins will be accepted.    Vaccines through the health department can be arranged by calling [phone]    Vaccines through [facility] can be arranged by calling [phone] or visiting [phone]   If you have further questions or concerns about the vaccine process, please visit [email or web address], [email or web address], or contact your primary care physician.     Two Gram Sodium Diet 2000 mg  What is Sodium? Sodium is a mineral found naturally in many foods. The most significant source of sodium in the diet is table salt, which is about 40% sodium.  Processed, convenience, and preserved foods also contain a large amount of sodium.  The body needs only 500 mg of sodium daily to function,  A normal diet provides more than enough sodium even if you do not use salt.  Why Limit Sodium? A build up of sodium in the body can cause thirst, increased blood  pressure, shortness of breath, and water retention.  Decreasing sodium in the diet can reduce edema and risk of heart attack or stroke associated with high blood pressure.  Keep in mind that there are many other factors involved in these health problems.  Heredity, obesity, lack of exercise, cigarette smoking, stress and what you eat all play a role.  General Guidelines:  Do not add salt at the table or in cooking.  One teaspoon of salt contains over 2 grams of sodium.  Read food labels  Avoid processed and convenience foods  Ask your dietitian before eating any foods not dicussed in the menu planning guidelines  Consult your physician if you wish to use a salt substitute or a sodium containing medication such as antacids.  Limit milk and milk products to 16 oz (2 cups) per day.  Shopping Hints:  READ LABELS!! "Dietetic" does not necessarily mean low sodium.  Salt and other sodium ingredients are often added to foods during processing.   Menu Planning Guidelines Food Group Choose More Often Avoid  Beverages (see also the milk group All fruit juices, low-sodium, salt-free vegetables juices, low-sodium carbonated beverages Regular vegetable or tomato juices, commercially softened water used for drinking or cooking  Breads and Cereals Enriched white, wheat, rye and pumpernickel bread, hard rolls and dinner rolls; muffins, cornbread and waffles; most dry cereals, cooked cereal without added salt; unsalted crackers and breadsticks; low sodium or homemade bread crumbs Bread, rolls and crackers with salted tops; quick breads; instant hot cereals; pancakes; commercial bread stuffing; self-rising flower and  biscuit mixes; regular bread crumbs or cracker crumbs  Desserts and Sweets Desserts and sweets mad with mild should be within allowance Instant pudding mixes and cake mixes  Fats Butter or margarine; vegetable oils; unsalted salad dressings, regular salad dressings limited to 1 Tbs; light, sour  and heavy cream Regular salad dressings containing bacon fat, bacon bits, and salt pork; snack dips made with instant soup mixes or processed cheese; salted nuts  Fruits Most fresh, frozen and canned fruits Fruits processed with salt or sodium-containing ingredient (some dried fruits are processed with sodium sulfites        Vegetables Fresh, frozen vegetables and low- sodium canned vegetables Regular canned vegetables, sauerkraut, pickled vegetables, and others prepared in brine; frozen vegetables in sauces; vegetables seasoned with ham, bacon or salt pork  Condiments, Sauces, Miscellaneous  Salt substitute with physician's approval; pepper, herbs, spices; vinegar, lemon or lime juice; hot pepper sauce; garlic powder, onion powder, low sodium soy sauce (1 Tbs.); low sodium condiments (ketchup, chili sauce, mustard) in limited amounts (1 tsp.) fresh ground horseradish; unsalted tortilla chips, pretzels, potato chips, popcorn, salsa (1/4 cup) Any seasoning made with salt including garlic salt, celery salt, onion salt, and seasoned salt; sea salt, rock salt, kosher salt; meat tenderizers; monosodium glutamate; mustard, regular soy sauce, barbecue, sauce, chili sauce, teriyaki sauce, steak sauce, Worcestershire sauce, and most flavored vinegars; canned gravy and mixes; regular condiments; salted snack foods, olives, picles, relish, horseradish sauce, catsup   Food preparation: Try these seasonings Meats:    Pork Sage, onion Serve with applesauce  Chicken Poultry seasoning, thyme, parsley Serve with cranberry sauce  Lamb Curry powder, rosemary, garlic, thyme Serve with mint sauce or jelly  Veal Marjoram, basil Serve with current jelly, cranberry sauce  Beef Pepper, bay leaf Serve with dry mustard, unsalted chive butter  Fish [place] leaf, dill Serve with unsalted lemon butter, unsalted parsley butter  Vegetables:    Asparagus Lemon juice   Broccoli Lemon juice   Carrots Mustard dressing parsley, mint,  nutmeg, glazed with unsalted butter and sugar   Green beans Marjoram, lemon juice, nutmeg,dill seed   Tomatoes Basil, marjoram, onion   Spice /blend for [employer]" 4 tsp ground thyme 1 tsp ground sage 3 tsp ground rosemary 4 tsp ground marjoram   Test your knowledge 1. A product that says "Salt Free" may still contain sodium. True or False 2. Garlic Powder and Hot Pepper Sauce an be used as alternative seasonings.True or False 3. Processed foods have more sodium than fresh foods.  True or False 4. Canned Vegetables have less sodium than froze True or False  WAYS TO DECREASE YOUR SODIUM INTAKE 1. Avoid the use of added salt in cooking and at the table.  Table salt (and other prepared seasonings which contain salt) is probably one of the greatest sources of sodium in the diet.  Unsalted foods can gain flavor from the sweet, sour, and butter taste sensations of herbs and spices.  Instead of using salt for seasoning, try the following seasonings with the foods listed.  Remember: how you use them to enhance natural food flavors is limited only by your creativity... Allspice-Meat, fish, eggs, fruit, peas, red and yellow vegetables Almond Extract-Fruit baked goods Anise Seed-Sweet breads, fruit, carrots, beets, cottage cheese, cookies (tastes like licorice) Basil-Meat, fish, eggs, vegetables, rice, vegetables salads, soups, sauces Bay Leaf-Meat, fish, stews, poultry Burnet-Salad, vegetables (cucumber-like flavor) Caraway Seed-Bread, cookies, cottage cheese, meat, vegetables, cheese, rice Cardamon-Baked goods, fruit, soups Celery Powder or  seed-Salads, salad dressings, sauces, meatloaf, soup, bread.Do not use  celery salt Chervil-Meats, salads, fish, eggs, vegetables, cottage cheese (parsley-like flavor) Chili Power-Meatloaf, chicken cheese, corn, eggplant, egg dishes Chives-Salads cottage cheese, egg dishes, soups, vegetables, sauces Cilantro-Salsa, casseroles Cinnamon-Baked goods, fruit,  pork, lamb, chicken, carrots Cloves-Fruit, baked goods, fish, pot roast, green beans, beets, carrots Coriander-Pastry, cookies, meat, salads, cheese (lemon-orange flavor) Cumin-Meatloaf, fish,cheese, eggs, cabbage,fruit pie (caraway flavor) [employer], fruit, eggs, fish, poultry, cottage cheese, vegetables Dill Seed-Meat, cottage cheese, poultry, vegetables, fish, salads, bread Fennel Seed-Bread, cookies, apples, pork, eggs, fish, beets, cabbage, cheese, Licorice-like flavor Garlic-(buds or powder) Salads, meat, poultry, fish, bread, butter, vegetables, potatoes.Do not  use garlic salt Ginger-Fruit, vegetables, baked goods, meat, fish, poultry Horseradish Root-Meet, vegetables, butter Lemon Juice or Extract-Vegetables, fruit, tea, baked goods, fish salads Mace-Baked goods fruit, vegetables, fish, poultry (taste like nutmeg) Maple Extract-Syrups Marjoram-Meat, chicken, fish, vegetables, breads, green salads (taste like Sage) Mint-Tea, lamb, sherbet, vegetables, desserts, carrots, cabbage Mustard, Dry or Seed-Cheese, eggs, meats, vegetables, poultry Nutmeg-Baked goods, fruit, chicken, eggs, vegetables, desserts Onion Powder-Meat, fish, poultry, vegetables, cheese, eggs, bread, rice salads (Do not use   Onion salt) Orange Extract-Desserts, baked goods Oregano-Pasta, eggs, cheese, onions, pork, lamb, fish, chicken, vegetables, green salads Paprika-Meat, fish, poultry, eggs, cheese, vegetables Parsley Flakes-Butter, vegetables, meat fish, poultry, eggs, bread, salads (certain forms may   Contain sodium Pepper-Meat fish, poultry, vegetables, eggs Peppermint Extract-Desserts, baked goods Poppy Seed-Eggs, bread, cheese, fruit dressings, baked goods, noodles, vegetables, cottage  [employer], poultry, meat, fish, cauliflower, turnips,eggs bread Saffron-Rice, bread, veal, chicken, fish, eggs Sage-Meat, fish, poultry, onions, eggplant, tomateos, pork,  stews Savory-Eggs, salads, poultry, meat, rice, vegetables, soups, pork Tarragon-Meat, poultry, fish, eggs, butter, vegetables (licorice-like flavor)  Thyme-Meat, poultry, fish, eggs, vegetables, (clover-like flavor), sauces, soups Tumeric-Salads, butter, eggs, fish, rice, vegetables (saffron-like flavor) Vanilla Extract-Baked goods, candy Vinegar-Salads, vegetables, meat marinades Walnut Extract-baked goods, candy  2. Choose your Foods Wisely   The following is a list of foods to avoid which are high in sodium:  Meats-Avoid all smoked, canned, salt cured, dried and kosher meat and fish as well as Anchovies   [employer] meats:Bologna, Liverwurst, Pastrami Canned meat or fish  Marinated herring Caviar    Pepperoni Corned Beef   Pizza Dried chipped beef  Salami Frozen breaded fish or meat Salt pork Frankfurters or hot dogs  Sardines Gefilte fish   Sausage Ham (boiled ham, Proscuitto Smoked butt    spiced ham)   Spam      TV Dinners Vegetables Canned vegetables (Regular) Relish Canned mushrooms  Sauerkraut Olives    Tomato juice Pickles  Bakery and Dessert Products Canned puddings  Cream pies Cheesecake   Decorated cakes Cookies  Beverages/Juices Tomato juice, regular  Gatorade   V-8 vegetable juice, regular  Breads and Cereals Biscuit mixes   Salted potato chips, corn chips, pretzels Bread stuffing mixes  Salted crackers and rolls Pancake and waffle mixes Self-rising flour  Seasonings Accent    Meat sauces Barbecue sauce  Meat tenderizer Catsup    Monosodium glutamate (MSG) Celery salt   Onion salt Chili sauce   Prepared mustard Garlic salt   Salt, seasoned salt, sea salt Gravy mixes   Soy sauce Horseradish   Steak sauce Ketchup   Tartar sauce Lite salt    Teriyaki sauce Marinade mixes   Worcestershire sauce  Others Baking powder   Cocoa and cocoa mixes Baking soda   Commercial casserole mixes Candy-caramels, chocolate  Dehydrated soups    Bars,  fudge,nougats  Instant rice and pasta mixes Canned broth or soup  Maraschino cherries Cheese, aged and processed cheese and cheese spreads  Learning Assessment Quiz  Indicated T (for True) or F (for False) for each of the following statements:  1. _____ Fresh fruits and vegetables and unprocessed grains are generally low in sodium 2. _____ Water may contain a considerable amount of sodium, depending on the source 3. _____ You can always tell if a food is high in sodium by tasting it 4. _____ Certain laxatives my be high in sodium and should be avoided unless prescribed   by a physician or pharmacist 5. _____ Salt substitutes may be used freely by anyone on a sodium restricted diet 6. _____ Sodium is present in table salt, food additives and as a natural component of   most foods 7. _____ Table salt is approximately 90% sodium 8. _____ Limiting sodium intake may help prevent excess fluid accumulation in the body 9. _____ On a sodium-restricted diet, seasonings such as bouillon soy sauce, and    cooking wine should be used in place of table salt 10. _____ On an ingredient list, a product which lists monosodium glutamate as the first   ingredient is an appropriate food to include on a low sodium diet  Circle the best answer(s) to the following statements (Hint: there may be more than one correct answer)  11. On a low-sodium diet, some acceptable snack items are:    A. Olives  F. Bean dip   K. Grapefruit juice    B. Salted Pretzels G. Commercial Popcorn   L. Canned peaches    C. Carrot Sticks  H. Bouillon   M. Unsalted nuts   D. [place] fries  I. Peanut butter crackers N. Salami   E. Sweet pickles J. Tomato Juice   O. Pizza  12.  Seasonings that may be used freely on a reduced - sodium diet include   A. Lemon wedges F.Monosodium glutamate K. Celery seed    B.Soysauce   G. Pepper   L. Mustard powder   C. Sea salt  H. Cooking wine  M. Onion flakes   D. Vinegar  E. Prepared  horseradish N. Salsa   E. Sage   J. Worcestershire sauce  O. [place]

## 2019-07-12 ENCOUNTER — Other Ambulatory Visit: Payer: Self-pay

## 2019-07-12 DIAGNOSIS — I482 Chronic atrial fibrillation, unspecified: Secondary | ICD-10-CM

## 2019-07-12 DIAGNOSIS — I10 Essential (primary) hypertension: Secondary | ICD-10-CM

## 2019-07-12 MED ORDER — METOPROLOL SUCCINATE ER 50 MG PO TB24: 50.0000 mg | ORAL_TABLET | Freq: Every day | ORAL | 3 refills | Status: DC

## 2019-07-12 MED ORDER — METOPROLOL SUCCINATE ER 50 MG PO TB24: 50.0000 mg | ORAL_TABLET | Freq: Every day | ORAL | 0 refills | Status: DC

## 2019-07-27 NOTE — Progress Notes (Signed)
Cardiology Office Note    Date:  08/03/2019   ID:  Yvonne Bailey, DOB 1940-09-11, MRN [id number]  PCP:  Binnie Rail, MD  Cardiologist: Lauree Chandler, MD EPS: None  Chief Complaint  Patient presents with  . Follow-up    History of Present Illness:  Yvonne Bailey is a 79 y.o. female with history of CAD status post anterior wall MI in 1992 treated with PCI.  Ejection fraction in 2007 was 45%, cath in 2017 severe three-vessel CAD turned down for CABG so underwent DES to the proximal and mid RCA and proximal circumflex, OM 2, diagonal 1 08/2015 LVEF 40 to 45% on echo 01/2016, also has PAF on Eliquis and had a CVA in 2018 due to noncompliance with Eliquis, hypertension, carotid artery disease, PAD, HLD.   Last saw Dr. Angelena Form 12/01/2017 at which she had some edema due to not taking Lasix for several days.  She was asked to take it daily.  Otherwise she was stable.  I saw patient 06/29/2019 at which time she had run out of Lasix for 4 months.  I asked her to take 40 mg daily for 4 days then half a tablet daily.  Patient comes in for f/u accompanied by her son. Has lost her fluid-11 lbs. Not walking much since her stroke and trouble getting motivated. Also still getting extra salt in her diet at times.   Past Medical History:  Diagnosis Date  . Bilateral leg edema 07/19/2015  . CAD (coronary artery disease)    a. anterior MI s/p PCI in 1992. b. cath 08/2015 with severe three-vessel CAD turned down for CABG and underwent DESx5 to prox Cx/OM2/D1/oRCA/mRCA  . Carotid artery disease ([place])    a. carotid duplex 03/2015 showed 1-39% BICA, normal subclavian arteries, chronically occluded left vertebral, f/u recommended only PRN.  . DVT (deep venous thrombosis) ([place])    X1  . History of nuclear stress test    a. Myoview 6/17: EF 20-25%, mid anteroseptal, apical anterior, apical septal, apical inferior, apical lateral and apical scar, no ischemia, intermediate risk  . HTN  (hypertension)   . Hyperlipidemia   . Hypokalemia   . Hypothyroidism   . Ischemic cardiomyopathy    a. Echo 6/17: EF 20-25%, apex appears akinetic, MAC, moderate MR, moderate LAE, mild RVE, trivial PI, PASP 47 mmHg (needs repeat with Definity contrast).  b. Limited echo with Definity contrast 7/17: EF 25-30%, moderate to severe LAE. c. Limited Echo 2018 showed EF 40-45%.  . Lacunar infarction ([place]) 12/17/2012   Dr Krista Blue, Neurology   . Leukocytosis   . 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 Longstanding persistent atrial fibrillation ([place])   . MI (myocardial infarction) ([place]) 1992  . PAD (peripheral artery disease) (H[facility])    Right SFA occlusion, severe disease left CFA and SFA  . Prediabetes 07/28/2016  . Stage 3 chronic kidney disease   . Stroke [facility])    a. 02/2017 in setting of noncompliance with Eliquis  . TIA (transient ischemic attack)   . Tricuspid regurgitation     Past Surgical History:  Procedure Laterality Date  . APPENDECTOMY     at hysterectomy and USO for fibroids, Dr. Ysidro Evert  . CARDIAC CATHETERIZATION  1992   Dr Eustace Quail  . CARDIAC CATHETERIZATION N/A 09/06/2015   Procedure: Right/Left Heart Cath and Coronary Angiography;  Surgeon: Burnell Blanks, MD;  Location: [place] [facility];  Service: Cardiovascular;  Laterality: N/A;  . CARDIAC CATHETERIZATION N/A 09/07/2015  Procedure: Coronary Stent Intervention;  Surgeon: Burnell Blanks, MD;  Location: [place] [facility];  Service: Cardiovascular;  Laterality: N/A;  . COLONOSCOPY     negative; 2008, Dr. Delfin Edis  . fracture LLE     '94; pinned  . IR ANGIO INTRA EXTRACRAN SEL COM CAROTID INNOMINATE BILAT MOD SED  03/02/2017  . IR ANGIO INTRA EXTRACRAN SEL COM CAROTID INNOMINATE BILAT MOD SED  04/23/2018  . IR ANGIO VERTEBRAL SEL SUBCLAVIAN INNOMINATE BILAT MOD SED  04/23/2018  . IR ANGIO VERTEBRAL SEL VERTEBRAL UNI R MOD SED  03/02/2017  . IR RADIOLOGIST EVAL & MGMT  04/28/2017  . IR US GUIDE VASC ACCESS  RIGHT  04/23/2018  . RADIOLOGY WITH ANESTHESIA N/A 03/02/2017   Procedure: RADIOLOGY WITH ANESTHESIA;  Surgeon: Luanne Bras, MD;  Location: [place];  Service: Radiology;  Laterality: N/A;  . TONSILLECTOMY    . TOTAL ABDOMINAL HYSTERECTOMY     & BSO for fibroids    Current Medications: Current Meds  Medication Sig  . amLODipine (NORVASC) 2.5 MG tablet TAKE 1 TABLET(2.5 MG) BY MOUTH DAILY  . aspirin EC 81 MG tablet Take 1 tablet (81 mg total) by mouth daily.  Marland Kitchen atorvastatin (LIPITOR) 80 MG tablet TAKE 1 TABLET(80 MG) BY MOUTH DAILY  . benazepril (LOTENSIN) 20 MG tablet TAKE 1 AND 1/2 TABLETS(30 MG) BY MOUTH DAILY  . brimonidine (ALPHAGAN) 0.2 % ophthalmic solution Place 1 drop into both eyes 3 (three) times daily.   . dorzolamide-timolol (COSOPT) 22.3-6.8 MG/ML ophthalmic solution Place 1 drop into both eyes 2 (two) times daily.   Marland Kitchen ELIQUIS 5 MG TABS tablet TAKE 1 TABLET TWICE DAILY  . levothyroxine (SYNTHROID) 100 MCG tablet Take 1 tablet (100 mcg total) by mouth daily.  . metoprolol succinate (TOPROL-XL) 50 MG 24 hr tablet Take 1 tablet (50 mg total) by mouth daily.  . nitroGLYCERIN (NITROSTAT) 0.4 MG SL tablet Place 1 tablet (0.4 mg total) under the tongue every 5 (five) minutes as needed for chest pain.  Marland Kitchen spironolactone (ALDACTONE) 25 MG tablet TAKE 1 TABLET(25 MG) BY MOUTH DAILY  . travoprost, benzalkonium, (TRAVATAN) 0.004 % ophthalmic solution Place 1 drop into both eyes at bedtime.  . [DISCONTINUED] furosemide (LASIX) 40 MG tablet Take 1 tablet (64m) once a day for 4 days then take 1/2 tablet (250m once a day.  . [DISCONTINUED] metoprolol succinate (TOPROL-XL) 50 MG 24 hr tablet Take 1 tablet (50 mg total) by mouth daily.     Allergies:   Definity [perflutren lipid microsphere] and Cilostazol   Social History   Socioeconomic History  . Marital status: Married    Spouse name: JaJeneen Rinks. Number of children: 1  . Years of education: college  . Highest education level: Not  on file  Occupational History  . Occupation: TePharmacist, hospital  Comment: Retired  Tobacco Use  . Smoking status: Former Smoker    Quit date: 03/11/1990    Years since quitting: 29.4  . Smokeless tobacco: Never Used  . Tobacco comment: [place] < 5 cigarettes  Substance and Sexual Activity  . Alcohol use: No  . Drug use: No  . Sexual activity: Not Currently    Birth control/protection: Surgical  Other Topics Concern  . 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 08/31/09- designated party form signed appointing, husband JaRashena Dowlingok to leave msg on home phone [phone]  Caffeine Small Amount one cup very rare.   Right handed.   One child- [name]   Social Determinants of Health   Financial Resource Strain:   . Difficulty of Paying Living Expenses:   Food Insecurity:   . Worried About Charity fundraiser in the Last Year:   . Arboriculturist in the Last Year:   Transportation Needs:   . Film/video editor (Medical):   Marland Kitchen Lack of Transportation (Non-Medical):   Physical Activity:   . Days of Exercise per Week:   . Minutes of Exercise per Session:   Stress:   . Feeling of Stress :   Social Connections:   . Frequency of Communication with Friends and Family:   . Frequency of Social Gatherings with Friends and Family:   . Attends Religious Services:   . Active Member of Clubs or Organizations:   . Attends Archivist Meetings:   Marland Kitchen Marital Status:      Family History:  The patient's   family history includes Coronary artery disease in her brother; Diabetes in her mother; Hypertension in her mother; Stroke in her brother.   ROS:   Please see the history of present illness.    ROS All other systems reviewed and are negative.   PHYSICAL EXAM:   VS:  BP 120/72   Pulse 72   Ht _0  (1.676 m)   Wt 188 lb 1.9 oz (85.3 kg)   SpO2 99%   BMI 30.36 kg/m   Physical Exam   GEN: Well nourished, well developed, in no acute distress  Neck: no JVD, carotid bruits, or masses Cardiac:irreg irreg; no murmurs, rubs, or gallops  Respiratory:  clear to auscultation bilaterally, normal work of breathing GI: soft, nontender, nondistended, + BS Ext: without cyanosis, clubbing, or edema, Good distal pulses bilaterally Neuro:  Alert and Oriented x 3 Psych: euthymic mood, full affect  Wt Readings from Last 3 Encounters:  08/03/19 188 lb 1.9 oz (85.3 kg)  06/29/19 199 lb (90.3 kg)  02/09/19 203 lb (92.1 kg)      Studies/Labs Reviewed:   EKG:  EKG is not ordered today.    Recent Labs: 02/09/2019: ALT 24; BUN 15; Creatinine, Ser 0.79; Hemoglobin 12.1; Platelets 231.0; Potassium 3.5; Sodium 141; TSH 0.01   Lipid Panel    Component Value Date/Time   CHOL 123 02/09/2019 1625   CHOL 131 06/03/2017 0921   TRIG 69.0 02/09/2019 1625   HDL 43.00 02/09/2019 1625   HDL 40 06/03/2017 0921   CHOLHDL 3 02/09/2019 1625   VLDL 13.8 02/09/2019 1625   LDLCALC 66 02/09/2019 1625   LDLCALC 72 06/03/2017 0921    Additional studies/ records that were reviewed today include:  Echo 03/03/17: - Procedure narrative: Transthoracic echocardiography. Image   quality was suboptimal. The study was technically difficult, as a   result of poor sound wave transmission and restricted patient   mobility. Intravenous contrast (agitated saline) was   administered. - Left ventricle: There is a false tendon in the mid LV of no   clinical significance. LVF appears reduced but poor acoustical   windows prevent accurate assessment of EF or wall motion. Tech   reported that patient has allergy to echo contrast. Bubble study   done but poor quality. Consider Cardiac MRI The cavity size was   normal. There was moderate concentric hypertrophy. Systolic   function was normal. Wall motion was normal; there were no   regional wall motion  abnormalities. - Aortic valve: Trileaflet; mildly thickened,  mildly calcified   leaflets. - Mitral valve: Valve area by continuity equation (using LVOT   flow): 0.86 cm^2. - Left atrium: The atrium was mildly dilated. - Pulmonary arteries: PA peak pressure: 32 mm Hg (S).             ASSESSMENT:    1. Coronary artery disease involving native coronary artery of native heart without angina pectoris   2. Ischemic cardiomyopathy   3. Chronic systolic CHF (congestive heart failure) ([place])   4. Longstanding persistent atrial fibrillation ([place])   5. History of CVA (cerebrovascular accident)   6. Essential hypertension   7. Hyperlipidemia, unspecified hyperlipidemia type      PLAN:  In order of problems listed above:  CAD status post DES to the ostial mid RCA, proximal circumflex, OM 2, first diagonal 08/2015 after she was felt not to be a candidate for CABG.  On aspirin, statin and beta-blocker-no angina.      Ischemic cardiomyopathy ejection fraction 45% on echo in 2018 on Lotensin Aldactone and Toprol   Acute on chronic systolic CHF patient had been out of Lasix for 4 months and I restarted last office visit-she has lost 11 pounds and doing a lot better.  Does not like to follow low-sodium diet but son is helping her.   Persistent atrial fibrillation on Eliquis and Toprol needs CBC and bmet today-ran out of Toprol last week and heart rate was running a little fast on exam.  Advised them to get filled and to be careful about not running out of meds.   History of CVA when not taking her Eliquis properly   Hypertension blood pressure controlled   Hyperlipidemia lipids checked by PCP 02/2019 and LDL was 66 on Lipitor         Medication Adjustments/Labs and Tests Ordered: Current medicines are reviewed at length with the patient today.  Concerns regarding medicines are outlined above.  Medication changes, Labs and Tests ordered today are listed in the Patient Instructions below. Patient Instructions  Medication Instructions:  Your  physician recommends that you continue on your current medications as directed. Please refer to the Current Medication list given to you today.  *If you need a refill on your cardiac medications before your next appointment, please call your pharmacy*   Lab Work: TODAY: BMET, CBC  If you have labs (blood work) drawn today and your tests are completely normal, you will receive your results only by: Marland Kitchen MyChart Message (if you have MyChart) OR . A paper copy in the mail If you have any lab test that is abnormal or we need to change your treatment, we will call you to review the results.   Testing/Procedures: None ordered   Follow-Up: Follow up with Ermalinda Barrios, PA on 11/03/19 at 1:15 PM   Other Instructions None     Signed, Ermalinda Barrios, PA-C  08/03/2019 2:08 PM    [facility] [place], [place], [place]  [address] Phone: [phone]; Fax: [phone]

## 2019-07-28 ENCOUNTER — Telehealth: Payer: Self-pay | Admitting: Cardiovascular Disease

## 2019-07-28 NOTE — Telephone Encounter (Signed)
We are recommending the COVID-19 vaccine to all of our patients. Cardiac medications (including blood thinners) should not deter anyone from being vaccinated and there is no need to hold any of those medications prior to vaccine administration.     Currently, there is a hotline to call (active 03/19/19) to schedule vaccination appointments as no walk-ins will be accepted.   Number: [phone].    If an appointment is not available please go to [email or web address] to sign up for notification when additional vaccine appointments are available.   If you have further questions or concerns about the vaccine process, please visit [email or web address] or contact your primary care physician.   

## 2019-08-03 ENCOUNTER — Encounter: Payer: Self-pay | Admitting: Physician Assistant

## 2019-08-03 ENCOUNTER — Other Ambulatory Visit: Payer: Self-pay

## 2019-08-03 ENCOUNTER — Ambulatory Visit: Payer: Medicare HMO | Admitting: Physician Assistant

## 2019-08-03 VITALS — BP 120/72 | HR 72 | Ht 66.0 in | Wt 188.1 lb

## 2019-08-03 DIAGNOSIS — I255 Ischemic cardiomyopathy: Secondary | ICD-10-CM | POA: Diagnosis not present

## 2019-08-03 DIAGNOSIS — I4811 Longstanding persistent atrial fibrillation: Secondary | ICD-10-CM | POA: Diagnosis not present

## 2019-08-03 DIAGNOSIS — I5022 Chronic systolic (congestive) heart failure: Secondary | ICD-10-CM | POA: Diagnosis not present

## 2019-08-03 DIAGNOSIS — Z8673 Personal history of transient ischemic attack (TIA), and cerebral infarction without residual deficits: Secondary | ICD-10-CM | POA: Diagnosis not present

## 2019-08-03 DIAGNOSIS — E785 Hyperlipidemia, unspecified: Secondary | ICD-10-CM

## 2019-08-03 DIAGNOSIS — I2510 Atherosclerotic heart disease of native coronary artery without angina pectoris: Secondary | ICD-10-CM

## 2019-08-03 DIAGNOSIS — I10 Essential (primary) hypertension: Secondary | ICD-10-CM | POA: Diagnosis not present

## 2019-08-03 MED ORDER — METOPROLOL SUCCINATE ER 50 MG PO TB24: 50.0000 mg | ORAL_TABLET | Freq: Every day | ORAL | 3 refills | Status: DC

## 2019-08-03 MED ORDER — FUROSEMIDE 20 MG PO TABS
20.0000 mg | ORAL_TABLET | Freq: Every day | ORAL | 3 refills | Status: DC
Start: 2019-08-03 — End: 2020-08-30

## 2019-08-03 NOTE — Patient Instructions (Signed)
Medication Instructions:  Your physician recommends that you continue on your current medications as directed. Please refer to the Current Medication list given to you today.  *If you need a refill on your cardiac medications before your next appointment, please call your pharmacy*   Lab Work: TODAY: BMET, CBC  If you have labs (blood work) drawn today and your tests are completely normal, you will receive your results only by: Marland Kitchen MyChart Message (if you have MyChart) OR . A paper copy in the mail If you have any lab test that is abnormal or we need to change your treatment, we will call you to review the results.   Testing/Procedures: None ordered   Follow-Up: Follow up with Jacolyn Reedy, PA on 11/03/19 at 1:15 PM   Other Instructions None

## 2019-08-04 LAB — CBC
Hematocrit: 40.4 % (ref 34.0–46.6)
Hemoglobin: 13.2 g/dL (ref 11.1–15.9)
MCH: 32 pg (ref 26.6–33.0)
MCHC: 32.7 g/dL (ref 31.5–35.7)
MCV: 98 fL — ABNORMAL HIGH (ref 79–97)
Platelets: 253 10*3/uL (ref 150–450)
RBC: 4.12 x10E6/uL (ref 3.77–5.28)
RDW: 12.1 % (ref 11.7–15.4)
WBC: 12.5 10*3/uL — ABNORMAL HIGH (ref 3.4–10.8)

## 2019-08-04 LAB — BASIC METABOLIC PANEL
BUN/Creatinine Ratio: 22 (ref 12–28)
BUN: 22 mg/dL (ref 8–27)
CO2: 24 mmol/L (ref 20–29)
Calcium: 9.9 mg/dL (ref 8.7–10.3)
Chloride: 104 mmol/L (ref 96–106)
Creatinine, Ser: 1.02 mg/dL — ABNORMAL HIGH (ref 0.57–1.00)
GFR calc Af Amer: 60 mL/min/{1.73_m2} (ref >59)
GFR calc non Af Amer: 52 mL/min/{1.73_m2} — ABNORMAL LOW (ref >59)
Glucose: 100 mg/dL — ABNORMAL HIGH (ref 65–99)
Potassium: 3.9 mmol/L (ref 3.5–5.2)
Sodium: 144 mmol/L (ref 134–144)

## 2019-08-08 NOTE — Progress Notes (Signed)
Subjective:    Patient ID: Yvonne Bailey, female    DOB: [birth year], 79 y.o.   MRN: [id number]  HPI The patient is here for follow up of their chronic medical problems, including cad, afib, hfref, htn, leg edema, h/o cva, high chol,, hypothyroid, prediabetes, ckd  She is taking all of her medications as prescribed.   She is not exercising regularly.       Medications and allergies reviewed with patient and updated if appropriate.  Patient Active Problem List   Diagnosis Date Noted  . 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 Leukocytosis   . Aphasia as late effect of cerebrovascular accident (CVA)   . Acute ischemic cerebrovascular accident (CVA) involving left middle cerebral artery [facility])   . Coronary artery disease involving native coronary artery without angina pectoris   . Stage 3 chronic kidney disease   . Stroke (cerebrum) ([place]) 03/02/2017  . Prediabetes 07/28/2016  . DOE (dyspnea on exertion) 07/09/2016  . Gait instability 07/09/2016  . Itching 04/16/2016  . Unstable angina ([place]) 09/06/2015  . Cardiomyopathy, ischemic   . Chronic systolic heart failure ([place]) 08/21/2015  . Bilateral leg edema 07/19/2015  . Atrial fibrillation ([place]) 07/19/2015  . Urinary urgency 03/30/2015  . Other musculoskeletal symptoms referable to limbs(729.89) 12/17/2012  . Lacunar infarction ([place]) 12/17/2012  . Small vessel disease, cerebrovascular 12/17/2012  . CAROTID BRUIT, RIGHT 08/10/2008  . Peripheral vascular disease ([place]) 06/04/2007  . Diverticulosis of large intestine 06/04/2007  . Hypothyroidism 02/24/2007  . Hyperlipidemia 02/24/2007  . Essential hypertension 02/24/2007  . Acute thromboembolism of deep veins of lower extremity ([place]) 01/21/2007  . Coronary atherosclerosis 10/29/2006    Current Outpatient Medications on File Prior to Visit  Medication Sig Dispense Refill  . amLODipine (NORVASC) 2.5 MG tablet TAKE 1 TABLET(2.5 MG) BY MOUTH DAILY 90 tablet 1  .  aspirin EC 81 MG tablet Take 1 tablet (81 mg total) by mouth daily. 90 tablet 3  . atorvastatin (LIPITOR) 80 MG tablet TAKE 1 TABLET(80 MG) BY MOUTH DAILY 90 tablet 0  . benazepril (LOTENSIN) 20 MG tablet TAKE 1 AND 1/2 TABLETS(30 MG) BY MOUTH DAILY 135 tablet 1  . brimonidine (ALPHAGAN) 0.2 % ophthalmic solution Place 1 drop into both eyes 3 (three) times daily.     . dorzolamide-timolol (COSOPT) 22.3-6.8 MG/ML ophthalmic solution Place 1 drop into both eyes 2 (two) times daily.     Marland Kitchen ELIQUIS 5 MG TABS tablet TAKE 1 TABLET TWICE DAILY 180 tablet 0  . furosemide (LASIX) 20 MG tablet Take 1 tablet (20 mg total) by mouth daily. 90 tablet 3  . levothyroxine (SYNTHROID) 100 MCG tablet Take 1 tablet (100 mcg total) by mouth daily. 90 tablet 1  . metoprolol succinate (TOPROL-XL) 50 MG 24 hr tablet Take 1 tablet (50 mg total) by mouth daily. 90 tablet 3  . nitroGLYCERIN (NITROSTAT) 0.4 MG SL tablet Place 1 tablet (0.4 mg total) under the tongue every 5 (five) minutes as needed for chest pain. 25 tablet 1  . spironolactone (ALDACTONE) 25 MG tablet TAKE 1 TABLET(25 MG) BY MOUTH DAILY 90 tablet 1  . travoprost, benzalkonium, (TRAVATAN) 0.004 % ophthalmic solution Place 1 drop into both eyes at bedtime.     No current facility-administered medications on file prior to visit.    Past Medical History:  Diagnosis Date  . Bilateral leg edema 07/19/2015  . CAD (coronary artery disease)    a. anterior MI s/p PCI in 1992. b. cath  08/2015 with severe three-vessel CAD turned down for CABG and underwent DESx5 to prox Cx/OM2/D1/oRCA/mRCA  . Carotid artery disease ([place])    a. carotid duplex 03/2015 showed 1-39% BICA, normal subclavian arteries, chronically occluded left vertebral, f/u recommended only PRN.  . DVT (deep venous thrombosis) ([place])    X1  . History of nuclear stress test    a. Myoview 6/17: EF 20-25%, mid anteroseptal, apical anterior, apical septal, apical inferior, apical lateral and apical scar, no  ischemia, intermediate risk  . HTN (hypertension)   . Hyperlipidemia   . Hypokalemia   . Hypothyroidism   . Ischemic cardiomyopathy    a. Echo 6/17: EF 20-25%, apex appears akinetic, MAC, moderate MR, moderate LAE, mild RVE, trivial PI, PASP 47 mmHg (needs repeat with Definity contrast).  b. Limited echo with Definity contrast 7/17: EF 25-30%, moderate to severe LAE. c. Limited Echo 2018 showed EF 40-45%.  . Lacunar infarction ([place]) 12/17/2012   Dr Krista Blue, Neurology   . Leukocytosis   . 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 Longstanding persistent atrial fibrillation ([place])   . MI (myocardial infarction) ([place]) 1992  . PAD (peripheral artery disease) ([facility])    Right SFA occlusion, severe disease left CFA and SFA  . Prediabetes 07/28/2016  . Stage 3 chronic kidney disease   . Stroke [facility])    a. 02/2017 in setting of noncompliance with Eliquis  . TIA (transient ischemic attack)   . Tricuspid regurgitation     Past Surgical History:  Procedure Laterality Date  . APPENDECTOMY     at hysterectomy and USO for fibroids, Dr. Ysidro Evert  . CARDIAC CATHETERIZATION  1992   Dr Eustace Quail  . CARDIAC CATHETERIZATION N/A 09/06/2015   Procedure: Right/Left Heart Cath and Coronary Angiography;  Surgeon: Burnell Blanks, MD;  Location: [place] [facility];  Service: Cardiovascular;  Laterality: N/A;  . CARDIAC CATHETERIZATION N/A 09/07/2015   Procedure: Coronary Stent Intervention;  Surgeon: Burnell Blanks, MD;  Location: [place] [facility];  Service: Cardiovascular;  Laterality: N/A;  . COLONOSCOPY     negative; 2008, Dr. Delfin Edis  . fracture LLE     '94; pinned  . IR ANGIO INTRA EXTRACRAN SEL COM CAROTID INNOMINATE BILAT MOD SED  03/02/2017  . IR ANGIO INTRA EXTRACRAN SEL COM CAROTID INNOMINATE BILAT MOD SED  04/23/2018  . IR ANGIO VERTEBRAL SEL SUBCLAVIAN INNOMINATE BILAT MOD SED  04/23/2018  . IR ANGIO VERTEBRAL SEL VERTEBRAL UNI R MOD SED  03/02/2017  . IR RADIOLOGIST EVAL & MGMT   04/28/2017  . IR US GUIDE VASC ACCESS RIGHT  04/23/2018  . RADIOLOGY WITH ANESTHESIA N/A 03/02/2017   Procedure: RADIOLOGY WITH ANESTHESIA;  Surgeon: Luanne Bras, MD;  Location: [place];  Service: Radiology;  Laterality: N/A;  . TONSILLECTOMY    . TOTAL ABDOMINAL HYSTERECTOMY     & BSO for fibroids    Social History   Socioeconomic History  . Marital status: Married    Spouse name: [name]  . Number of children: 1  . Years of education: college  . Highest education level: Not on file  Occupational History  . Occupation: Pharmacist, hospital    Comment: Retired  Tobacco Use  . Smoking status: Former Smoker    Quit date: 03/11/1990    Years since quitting: 29.4  . Smokeless tobacco: Never Used  . Tobacco comment: smoked [place], up to < 5 cigarettes  Substance and Sexual Activity  . Alcohol use: No  . Drug use:  No  . Sexual activity: Not Currently    Birth control/protection: Surgical  Other Topics Concern  . 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 08/31/09- designated party form signed appointing, husband Evelena Masci; ok to leave msg on home phone [phone].      Caffeine Small Amount one cup very rare.   Right handed.   One child- [name]   Social Determinants of Health   Financial Resource Strain:   . Difficulty of Paying Living Expenses:   Food Insecurity:   . Worried About Charity fundraiser in the Last Year:   . Arboriculturist in the Last Year:   Transportation Needs:   . Film/video editor (Medical):   Marland Kitchen Lack of Transportation (Non-Medical):   Physical Activity:   . Days of Exercise per Week:   . Minutes of Exercise per Session:   Stress:   . Feeling of Stress :   Social Connections:   . Frequency of Communication with Friends and Family:   . Frequency of Social Gatherings with Friends and Family:   . Attends Religious Services:   . Active Member of Clubs or Organizations:   .  Attends Archivist Meetings:   Marland Kitchen Marital Status:     Family History  Problem Relation Age of Onset  . Diabetes Mother   . Hypertension Mother   . Stroke Brother        ?> 55  . Coronary artery disease Brother        stent in 64s  . Cancer Neg Hx     Review of Systems     Objective:  There were no vitals filed for this visit. BP Readings from Last 3 Encounters:  08/03/19 120/72  06/29/19 (!) 142/82  02/09/19 128/64   Wt Readings from Last 3 Encounters:  08/03/19 188 lb 1.9 oz (85.3 kg)  06/29/19 199 lb (90.3 kg)  02/09/19 203 lb (92.1 kg)   There is no height or weight on file to calculate BMI.   Physical Exam    Constitutional: Appears well-developed and well-nourished. No distress.  HENT:  Head: Normocephalic and atraumatic.  Neck: Neck supple. No tracheal deviation present. No thyromegaly present.  No cervical lymphadenopathy Cardiovascular: Normal rate, regular rhythm and normal heart sounds.   No murmur heard. No carotid bruit .  No edema Pulmonary/Chest: Effort normal and breath sounds normal. No respiratory distress. No has no wheezes. No rales.  Skin: Skin is warm and dry. Not diaphoretic.  Psychiatric: Normal mood and affect. Behavior is normal.      Assessment & Plan:    See Problem List for Assessment and Plan of chronic medical problems.    This visit occurred during the SARS-CoV-2 public health emergency.  Safety protocols were in place, including screening questions prior to the visit, additional usage of staff PPE, and extensive cleaning of exam room while observing appropriate contact time as indicated for disinfecting solutions.    This encounter was created in error - please disregard.

## 2019-08-08 NOTE — Patient Instructions (Signed)
  Blood work was ordered.     Medications reviewed and updated.  Changes include :     Your prescription(s) have been submitted to your pharmacy. Please take as directed and contact our office if you believe you are having problem(s) with the medication(s).  A referral was ordered for        Someone will call you to schedule this.    Please followup in 6 months   

## 2019-08-10 ENCOUNTER — Encounter: Payer: Medicare HMO | Admitting: Internal Medicine

## 2019-08-10 ENCOUNTER — Ambulatory Visit: Payer: Medicare HMO

## 2019-08-19 NOTE — Patient Instructions (Addendum)
  Blood work was ordered.     Medications reviewed and updated.  Changes include :   none   A referral was ordered for home PT/OT       Someone will call you to schedule this.    Please followup in 6 months

## 2019-08-19 NOTE — Progress Notes (Signed)
Subjective:    Patient ID: Yvonne Bailey, female    DOB: [birth year], 79 y.o.   MRN: [id number]  HPI The patient is here for follow up of their chronic medical problems, including cad, afib, hfref, htn, leg edema, h/o cva, high chol, hypothyroid, prediabetes, ckd  She is taking all of her medications as prescribed.   She is not exercising regularly.  Her son tries to get her to be more active.    She is eating healthy and has lost weight.     Medications and allergies reviewed with patient and updated if appropriate.  Patient Active Problem List   Diagnosis Date Noted  . 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 Leukocytosis   . Aphasia as late effect of cerebrovascular accident (CVA)   . Acute ischemic cerebrovascular accident (CVA) involving left middle cerebral artery territory [facility])   . Coronary artery disease involving native coronary artery without angina pectoris   . Stage 3 chronic kidney disease   . Stroke (cerebrum) ([place]) 03/02/2017  . Prediabetes 07/28/2016  . DOE (dyspnea on exertion) 07/09/2016  . Gait instability 07/09/2016  . Unstable angina ([place]) 09/06/2015  . Cardiomyopathy, ischemic   . Chronic systolic heart failure ([place]) 08/21/2015  . Bilateral leg edema 07/19/2015  . Atrial fibrillation ([place]) 07/19/2015  . Urinary urgency 03/30/2015  . Other musculoskeletal symptoms referable to limbs(729.89) 12/17/2012  . Lacunar infarction ([place]) 12/17/2012  . Small vessel disease, cerebrovascular 12/17/2012  . CAROTID BRUIT, RIGHT 08/10/2008  . Peripheral vascular disease ([place]) 06/04/2007  . Diverticulosis of large intestine 06/04/2007  . Hypothyroidism 02/24/2007  . Hyperlipidemia 02/24/2007  . Essential hypertension 02/24/2007  . Acute thromboembolism of deep veins of lower extremity ([place]) 01/21/2007  . Coronary atherosclerosis 10/29/2006    Current Outpatient Medications on File Prior to Visit  Medication Sig Dispense Refill  . amLODipine (NORVASC) 2.5  MG tablet TAKE 1 TABLET(2.5 MG) BY MOUTH DAILY 90 tablet 1  . aspirin EC 81 MG tablet Take 1 tablet (81 mg total) by mouth daily. 90 tablet 3  . atorvastatin (LIPITOR) 80 MG tablet TAKE 1 TABLET(80 MG) BY MOUTH DAILY 90 tablet 0  . benazepril (LOTENSIN) 20 MG tablet TAKE 1 AND 1/2 TABLETS(30 MG) BY MOUTH DAILY 135 tablet 1  . brimonidine (ALPHAGAN) 0.2 % ophthalmic solution Place 1 drop into both eyes 3 (three) times daily.     . dorzolamide-timolol (COSOPT) 22.3-6.8 MG/ML ophthalmic solution Place 1 drop into both eyes 2 (two) times daily.     Marland Kitchen ELIQUIS 5 MG TABS tablet TAKE 1 TABLET TWICE DAILY 180 tablet 0  . furosemide (LASIX) 20 MG tablet Take 1 tablet (20 mg total) by mouth daily. 90 tablet 3  . levothyroxine (SYNTHROID) 100 MCG tablet Take 1 tablet (100 mcg total) by mouth daily. 90 tablet 1  . metoprolol succinate (TOPROL-XL) 50 MG 24 hr tablet Take 1 tablet (50 mg total) by mouth daily. 90 tablet 3  . nitroGLYCERIN (NITROSTAT) 0.4 MG SL tablet Place 1 tablet (0.4 mg total) under the tongue every 5 (five) minutes as needed for chest pain. 25 tablet 1  . spironolactone (ALDACTONE) 25 MG tablet TAKE 1 TABLET(25 MG) BY MOUTH DAILY 90 tablet 1  . travoprost, benzalkonium, (TRAVATAN) 0.004 % ophthalmic solution Place 1 drop into both eyes at bedtime.     No current facility-administered medications on file prior to visit.    Past Medical History:  Diagnosis Date  . Bilateral leg edema 07/19/2015  .  CAD (coronary artery disease)    a. anterior MI s/p PCI in 1992. b. cath 08/2015 with severe three-vessel CAD turned down for CABG and underwent DESx5 to prox Cx/OM2/D1/oRCA/mRCA  . Carotid artery disease ([place])    a. carotid duplex 03/2015 showed 1-39% BICA, normal subclavian arteries, chronically occluded left vertebral, f/u recommended only PRN.  . DVT (deep venous thrombosis) ([place])    X1  . History of nuclear stress test    a. Myoview 6/17: EF 20-25%, mid anteroseptal, apical anterior, apical  septal, apical inferior, apical lateral and apical scar, no ischemia, intermediate risk  . HTN (hypertension)   . Hyperlipidemia   . Hypokalemia   . Hypothyroidism   . Ischemic cardiomyopathy    a. Echo 6/17: EF 20-25%, apex appears akinetic, MAC, moderate MR, moderate LAE, mild RVE, trivial PI, PASP 47 mmHg (needs repeat with Definity contrast).  b. Limited echo with Definity contrast 7/17: EF 25-30%, moderate to severe LAE. c. Limited Echo 2018 showed EF 40-45%.  . Lacunar infarction ([place]) 12/17/2012   Dr Krista Blue, Neurology   . Leukocytosis   . 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 Longstanding persistent atrial fibrillation ([place])   . MI (myocardial infarction) ([place]) 1992  . PAD (peripheral artery disease) (H[facility])    Right SFA occlusion, severe disease left CFA and SFA  . Prediabetes 07/28/2016  . Stage 3 chronic kidney disease   . Stroke [facility])    a. 02/2017 in setting of noncompliance with Eliquis  . TIA (transient ischemic attack)   . Tricuspid regurgitation     Past Surgical History:  Procedure Laterality Date  . APPENDECTOMY     at hysterectomy and USO for fibroids, Dr. Ysidro Evert  . CARDIAC CATHETERIZATION  1992   Dr Eustace Quail  . CARDIAC CATHETERIZATION N/A 09/06/2015   Procedure: Right/Left Heart Cath and Coronary Angiography;  Surgeon: Burnell Blanks, MD;  Location: [place] [facility];  Service: Cardiovascular;  Laterality: N/A;  . CARDIAC CATHETERIZATION N/A 09/07/2015   Procedure: Coronary Stent Intervention;  Surgeon: Burnell Blanks, MD;  Location: [place] [facility];  Service: Cardiovascular;  Laterality: N/A;  . COLONOSCOPY     negative; 2008, Dr. Delfin Edis  . fracture LLE     '94; pinned  . IR ANGIO INTRA EXTRACRAN SEL COM CAROTID INNOMINATE BILAT MOD SED  03/02/2017  . IR ANGIO INTRA EXTRACRAN SEL COM CAROTID INNOMINATE BILAT MOD SED  04/23/2018  . IR ANGIO VERTEBRAL SEL SUBCLAVIAN INNOMINATE BILAT MOD SED  04/23/2018  . IR ANGIO VERTEBRAL SEL VERTEBRAL  UNI R MOD SED  03/02/2017  . IR RADIOLOGIST EVAL & MGMT  04/28/2017  . IR US GUIDE VASC ACCESS RIGHT  04/23/2018  . RADIOLOGY WITH ANESTHESIA N/A 03/02/2017   Procedure: RADIOLOGY WITH ANESTHESIA;  Surgeon: Luanne Bras, MD;  Location: [place];  Service: Radiology;  Laterality: N/A;  . TONSILLECTOMY    . TOTAL ABDOMINAL HYSTERECTOMY     & BSO for fibroids    Social History   Socioeconomic History  . Marital status: Married    Spouse name: [name]  . Number of children: 1  . Years of education: college  . Highest education level: Not on file  Occupational History  . Occupation: Pharmacist, hospital    Comment: Retired  Tobacco Use  . Smoking status: Former Smoker    Quit date: 03/11/1990    Years since quitting: 29.4  . Smokeless tobacco: Never Used  . Tobacco comment: smoked [place], up to <  5 cigarettes  Vaping Use  . Vaping Use: Never used  Substance and Sexual Activity  . Alcohol use: No  . Drug use: No  . Sexual activity: Not Currently    Birth control/protection: Surgical  Other Topics Concern  . 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 08/31/09- designated party form signed appointing, husband Lorenza Shakir; ok to leave msg on home phone [phone].      Caffeine Small Amount one cup very rare.   Right handed.   One child- [name]   Social Determinants of Health   Financial Resource Strain:   . Difficulty of Paying Living Expenses:   Food Insecurity:   . Worried About Charity fundraiser in the Last Year:   . Arboriculturist in the Last Year:   Transportation Needs:   . Film/video editor (Medical):   Marland Kitchen Lack of Transportation (Non-Medical):   Physical Activity:   . Days of Exercise per Week:   . Minutes of Exercise per Session:   Stress:   . Feeling of Stress :   Social Connections:   . Frequency of Communication with Friends and Family:   . Frequency of Social Gatherings with Friends  and Family:   . Attends Religious Services:   . Active Member of Clubs or Organizations:   . Attends Archivist Meetings:   Marland Kitchen Marital Status:     Family History  Problem Relation Age of Onset  . Diabetes Mother   . Hypertension Mother   . Stroke Brother        ?> 55  . Coronary artery disease Brother        stent in 85s  . Cancer Neg Hx     Review of Systems  Constitutional: Negative for fever.  Respiratory: Negative for cough, shortness of breath and wheezing.   Cardiovascular: Positive for leg swelling (chronic, controlled). Negative for chest pain and palpitations.  Musculoskeletal: Positive for gait problem.  Neurological: Negative for light-headedness, numbness and headaches.       Objective:   Vitals:   08/20/19 1359  BP: 124/60  Pulse: 79  Temp: 97.9 F (36.6 C)  SpO2: 97%   BP Readings from Last 3 Encounters:  08/20/19 124/60  08/03/19 120/72  06/29/19 (!) 142/82   Wt Readings from Last 3 Encounters:  08/20/19 186 lb (84.4 kg)  08/03/19 188 lb 1.9 oz (85.3 kg)  06/29/19 199 lb (90.3 kg)   Body mass index is 30.02 kg/m.   Physical Exam    Constitutional: Appears well-developed and well-nourished. No distress.  HENT:  Head: Normocephalic and atraumatic.  Neck: Neck supple. No tracheal deviation present. No thyromegaly present.  No cervical lymphadenopathy Cardiovascular: Normal rate, regular rhythm and normal heart sounds.   No murmur heard. No carotid bruit .  No edema Pulmonary/Chest: Effort normal and breath sounds normal. No respiratory distress. No has no wheezes. No rales.  Neuro: gait unsteady Skin: Skin is warm and dry. Not diaphoretic.  Psychiatric: Normal mood and affect. Behavior is normal.      Assessment & Plan:    See Problem List for Assessment and Plan of chronic medical problems.    This visit occurred during the SARS-CoV-2 public health emergency.  Safety protocols were in place, including screening questions  prior to the visit, additional usage of staff PPE, and extensive cleaning of exam room while observing appropriate contact time  as indicated for disinfecting solutions.

## 2019-08-20 ENCOUNTER — Encounter: Payer: Self-pay | Admitting: Internal Medicine

## 2019-08-20 ENCOUNTER — Ambulatory Visit (INDEPENDENT_AMBULATORY_CARE_PROVIDER_SITE_OTHER): Payer: Medicare HMO | Admitting: Internal Medicine

## 2019-08-20 ENCOUNTER — Other Ambulatory Visit: Payer: Self-pay

## 2019-08-20 ENCOUNTER — Ambulatory Visit: Payer: Medicare HMO

## 2019-08-20 VITALS — BP 124/60 | HR 79 | Temp 97.9°F | Ht 66.0 in | Wt 186.0 lb

## 2019-08-20 DIAGNOSIS — I4811 Longstanding persistent atrial fibrillation: Secondary | ICD-10-CM

## 2019-08-20 DIAGNOSIS — E7849 Other hyperlipidemia: Secondary | ICD-10-CM

## 2019-08-20 DIAGNOSIS — R5381 Other malaise: Secondary | ICD-10-CM

## 2019-08-20 DIAGNOSIS — N1831 Chronic kidney disease, stage 3a: Secondary | ICD-10-CM

## 2019-08-20 DIAGNOSIS — I5022 Chronic systolic (congestive) heart failure: Secondary | ICD-10-CM | POA: Diagnosis not present

## 2019-08-20 DIAGNOSIS — I10 Essential (primary) hypertension: Secondary | ICD-10-CM

## 2019-08-20 DIAGNOSIS — I2510 Atherosclerotic heart disease of native coronary artery without angina pectoris: Secondary | ICD-10-CM

## 2019-08-20 DIAGNOSIS — R7303 Prediabetes: Secondary | ICD-10-CM | POA: Diagnosis not present

## 2019-08-20 DIAGNOSIS — E038 Other specified hypothyroidism: Secondary | ICD-10-CM

## 2019-08-20 DIAGNOSIS — R2681 Unsteadiness on feet: Secondary | ICD-10-CM

## 2019-08-20 DIAGNOSIS — I69320 Aphasia following cerebral infarction: Secondary | ICD-10-CM | POA: Diagnosis not present

## 2019-08-20 LAB — COMPREHENSIVE METABOLIC PANEL
ALT: 18 U/L (ref 0–35)
AST: 26 U/L (ref 0–37)
Albumin: 4.1 g/dL (ref 3.5–5.2)
Alkaline Phosphatase: 125 U/L — ABNORMAL HIGH (ref 39–117)
BUN: 24 mg/dL — ABNORMAL HIGH (ref 6–23)
CO2: 27 mEq/L (ref 19–32)
Calcium: 9.5 mg/dL (ref 8.4–10.5)
Chloride: 105 mEq/L (ref 96–112)
Creatinine, Ser: 1.07 mg/dL (ref 0.40–1.20)
GFR: 59.84 mL/min — ABNORMAL LOW (ref >60.00)
Glucose, Bld: 93 mg/dL (ref 70–99)
Potassium: 3.9 mEq/L (ref 3.5–5.1)
Sodium: 138 mEq/L (ref 135–145)
Total Bilirubin: 0.8 mg/dL (ref 0.2–1.2)
Total Protein: 7.1 g/dL (ref 6.0–8.3)

## 2019-08-20 LAB — CBC WITH DIFFERENTIAL/PLATELET
Basophils Absolute: 0.1 10*3/uL (ref 0.0–0.1)
Basophils Relative: 0.8 % (ref 0.0–3.0)
Eosinophils Absolute: 0.2 10*3/uL (ref 0.0–0.7)
Eosinophils Relative: 2.1 % (ref 0.0–5.0)
HCT: 38.2 % (ref 36.0–46.0)
Hemoglobin: 12.7 g/dL (ref 12.0–15.0)
Lymphocytes Relative: 23.2 % (ref 12.0–46.0)
Lymphs Abs: 2.5 10*3/uL (ref 0.7–4.0)
MCHC: 33.3 g/dL (ref 30.0–36.0)
MCV: 96.2 fl (ref 78.0–100.0)
Monocytes Absolute: 0.9 10*3/uL (ref 0.1–1.0)
Monocytes Relative: 8.4 % (ref 3.0–12.0)
Neutro Abs: 7.1 10*3/uL (ref 1.4–7.7)
Neutrophils Relative %: 65.5 % (ref 43.0–77.0)
Platelets: 244 10*3/uL (ref 150.0–400.0)
RBC: 3.97 Mil/uL (ref 3.87–5.11)
RDW: 13.4 % (ref 11.5–15.5)
WBC: 10.8 10*3/uL — ABNORMAL HIGH (ref 4.0–10.5)

## 2019-08-20 LAB — LIPID PANEL
Cholesterol: 109 mg/dL (ref 0–200)
HDL: 35.6 mg/dL — ABNORMAL LOW (ref >39.00)
LDL Cholesterol: 60 mg/dL (ref 0–99)
NonHDL: 73.26
Total CHOL/HDL Ratio: 3
Triglycerides: 64 mg/dL (ref 0.0–149.0)
VLDL: 12.8 mg/dL (ref 0.0–40.0)

## 2019-08-20 LAB — TSH: TSH: 0.06 u[IU]/mL — ABNORMAL LOW (ref 0.35–4.50)

## 2019-08-20 NOTE — Assessment & Plan Note (Signed)
Chronic Check lipid panel  Continue daily statin Regular exercise and healthy diet encouraged  

## 2019-08-20 NOTE — Assessment & Plan Note (Signed)
Chronic  Clinically euthyroid Check tsh  Titrate med dose if needed  

## 2019-08-20 NOTE — Assessment & Plan Note (Signed)
Chronic Taking ASA 81 mg daily, eliquis, statin Ck labs today encouraged more regular exercise

## 2019-08-20 NOTE — Assessment & Plan Note (Signed)
Chronic irregularly rate and rhythm

## 2019-08-20 NOTE — Assessment & Plan Note (Signed)
Chronic Related to physical deconditioning Stressed regular exericse Uses walker at home or furniture surfs Would benefit from home PT  - ordered

## 2019-08-20 NOTE — Assessment & Plan Note (Signed)
Chronic Check a1c Low sugar / carb diet Stressed regular exercise  

## 2019-08-20 NOTE — Assessment & Plan Note (Signed)
Chronic Drinking a lot of water cmp

## 2019-08-20 NOTE — Assessment & Plan Note (Signed)
Chronic BP well controlled Current regimen effective and well tolerated Continue current medications at current doses cmp  

## 2019-08-23 ENCOUNTER — Other Ambulatory Visit: Payer: Self-pay | Admitting: Internal Medicine

## 2019-08-23 DIAGNOSIS — E038 Other specified hypothyroidism: Secondary | ICD-10-CM

## 2019-08-23 LAB — HEMOGLOBIN A1C: Hgb A1c MFr Bld: 5.4 % (ref 4.6–6.5)

## 2019-08-23 MED ORDER — LEVOTHYROXINE SODIUM 88 MCG PO TABS: 88.0000 ug | ORAL_TABLET | Freq: Every day | ORAL | 1 refills | Status: DC

## 2019-08-28 DIAGNOSIS — K5730 Diverticulosis of large intestine without perforation or abscess without bleeding: Secondary | ICD-10-CM | POA: Diagnosis not present

## 2019-08-28 DIAGNOSIS — N1831 Chronic kidney disease, stage 3a: Secondary | ICD-10-CM | POA: Diagnosis not present

## 2019-08-28 DIAGNOSIS — I255 Ischemic cardiomyopathy: Secondary | ICD-10-CM | POA: Diagnosis not present

## 2019-08-28 DIAGNOSIS — I4811 Longstanding persistent atrial fibrillation: Secondary | ICD-10-CM | POA: Diagnosis not present

## 2019-08-28 DIAGNOSIS — I130 Hypertensive heart and chronic kidney disease with heart failure and stage 1 through stage 4 chronic kidney disease, or unspecified chronic kidney disease: Secondary | ICD-10-CM | POA: Diagnosis not present

## 2019-08-28 DIAGNOSIS — I739 Peripheral vascular disease, unspecified: Secondary | ICD-10-CM | POA: Diagnosis not present

## 2019-08-28 DIAGNOSIS — I2510 Atherosclerotic heart disease of native coronary artery without angina pectoris: Secondary | ICD-10-CM | POA: Diagnosis not present

## 2019-08-28 DIAGNOSIS — I5022 Chronic systolic (congestive) heart failure: Secondary | ICD-10-CM | POA: Diagnosis not present

## 2019-08-28 DIAGNOSIS — I69320 Aphasia following cerebral infarction: Secondary | ICD-10-CM | POA: Diagnosis not present

## 2019-08-31 DIAGNOSIS — I739 Peripheral vascular disease, unspecified: Secondary | ICD-10-CM | POA: Diagnosis not present

## 2019-08-31 DIAGNOSIS — I255 Ischemic cardiomyopathy: Secondary | ICD-10-CM | POA: Diagnosis not present

## 2019-08-31 DIAGNOSIS — I4811 Longstanding persistent atrial fibrillation: Secondary | ICD-10-CM | POA: Diagnosis not present

## 2019-08-31 DIAGNOSIS — I130 Hypertensive heart and chronic kidney disease with heart failure and stage 1 through stage 4 chronic kidney disease, or unspecified chronic kidney disease: Secondary | ICD-10-CM | POA: Diagnosis not present

## 2019-08-31 DIAGNOSIS — K5730 Diverticulosis of large intestine without perforation or abscess without bleeding: Secondary | ICD-10-CM | POA: Diagnosis not present

## 2019-08-31 DIAGNOSIS — I69320 Aphasia following cerebral infarction: Secondary | ICD-10-CM | POA: Diagnosis not present

## 2019-08-31 DIAGNOSIS — I2510 Atherosclerotic heart disease of native coronary artery without angina pectoris: Secondary | ICD-10-CM | POA: Diagnosis not present

## 2019-08-31 DIAGNOSIS — I5022 Chronic systolic (congestive) heart failure: Secondary | ICD-10-CM | POA: Diagnosis not present

## 2019-08-31 DIAGNOSIS — N1831 Chronic kidney disease, stage 3a: Secondary | ICD-10-CM | POA: Diagnosis not present

## 2019-09-03 DIAGNOSIS — I739 Peripheral vascular disease, unspecified: Secondary | ICD-10-CM | POA: Diagnosis not present

## 2019-09-03 DIAGNOSIS — I5022 Chronic systolic (congestive) heart failure: Secondary | ICD-10-CM | POA: Diagnosis not present

## 2019-09-03 DIAGNOSIS — I2510 Atherosclerotic heart disease of native coronary artery without angina pectoris: Secondary | ICD-10-CM | POA: Diagnosis not present

## 2019-09-03 DIAGNOSIS — I071 Rheumatic tricuspid insufficiency: Secondary | ICD-10-CM

## 2019-09-03 DIAGNOSIS — Z87891 Personal history of nicotine dependence: Secondary | ICD-10-CM

## 2019-09-03 DIAGNOSIS — R32 Unspecified urinary incontinence: Secondary | ICD-10-CM

## 2019-09-03 DIAGNOSIS — I255 Ischemic cardiomyopathy: Secondary | ICD-10-CM | POA: Diagnosis not present

## 2019-09-03 DIAGNOSIS — E038 Other specified hypothyroidism: Secondary | ICD-10-CM

## 2019-09-03 DIAGNOSIS — N1831 Chronic kidney disease, stage 3a: Secondary | ICD-10-CM | POA: Diagnosis not present

## 2019-09-03 DIAGNOSIS — E7849 Other hyperlipidemia: Secondary | ICD-10-CM

## 2019-09-03 DIAGNOSIS — I130 Hypertensive heart and chronic kidney disease with heart failure and stage 1 through stage 4 chronic kidney disease, or unspecified chronic kidney disease: Secondary | ICD-10-CM | POA: Diagnosis not present

## 2019-09-03 DIAGNOSIS — I252 Old myocardial infarction: Secondary | ICD-10-CM

## 2019-09-03 DIAGNOSIS — Z86718 Personal history of other venous thrombosis and embolism: Secondary | ICD-10-CM

## 2019-09-03 DIAGNOSIS — Z7982 Long term (current) use of aspirin: Secondary | ICD-10-CM

## 2019-09-03 DIAGNOSIS — Z951 Presence of aortocoronary bypass graft: Secondary | ICD-10-CM

## 2019-09-03 DIAGNOSIS — I4811 Longstanding persistent atrial fibrillation: Secondary | ICD-10-CM | POA: Diagnosis not present

## 2019-09-03 DIAGNOSIS — Z7901 Long term (current) use of anticoagulants: Secondary | ICD-10-CM

## 2019-09-03 DIAGNOSIS — R7303 Prediabetes: Secondary | ICD-10-CM

## 2019-09-03 DIAGNOSIS — K5730 Diverticulosis of large intestine without perforation or abscess without bleeding: Secondary | ICD-10-CM | POA: Diagnosis not present

## 2019-09-03 DIAGNOSIS — I69320 Aphasia following cerebral infarction: Secondary | ICD-10-CM | POA: Diagnosis not present

## 2019-09-03 DIAGNOSIS — Z8673 Personal history of transient ischemic attack (TIA), and cerebral infarction without residual deficits: Secondary | ICD-10-CM

## 2019-09-03 DIAGNOSIS — Z9181 History of falling: Secondary | ICD-10-CM

## 2019-09-03 DIAGNOSIS — L280 Lichen simplex chronicus: Secondary | ICD-10-CM

## 2019-09-07 ENCOUNTER — Other Ambulatory Visit: Payer: Self-pay | Admitting: Internal Medicine

## 2019-09-07 DIAGNOSIS — K5730 Diverticulosis of large intestine without perforation or abscess without bleeding: Secondary | ICD-10-CM | POA: Diagnosis not present

## 2019-09-07 DIAGNOSIS — I130 Hypertensive heart and chronic kidney disease with heart failure and stage 1 through stage 4 chronic kidney disease, or unspecified chronic kidney disease: Secondary | ICD-10-CM | POA: Diagnosis not present

## 2019-09-07 DIAGNOSIS — N1831 Chronic kidney disease, stage 3a: Secondary | ICD-10-CM | POA: Diagnosis not present

## 2019-09-07 DIAGNOSIS — I69320 Aphasia following cerebral infarction: Secondary | ICD-10-CM | POA: Diagnosis not present

## 2019-09-07 DIAGNOSIS — I5022 Chronic systolic (congestive) heart failure: Secondary | ICD-10-CM | POA: Diagnosis not present

## 2019-09-07 DIAGNOSIS — I4811 Longstanding persistent atrial fibrillation: Secondary | ICD-10-CM | POA: Diagnosis not present

## 2019-09-07 DIAGNOSIS — I255 Ischemic cardiomyopathy: Secondary | ICD-10-CM | POA: Diagnosis not present

## 2019-09-07 DIAGNOSIS — I2510 Atherosclerotic heart disease of native coronary artery without angina pectoris: Secondary | ICD-10-CM | POA: Diagnosis not present

## 2019-09-07 DIAGNOSIS — I739 Peripheral vascular disease, unspecified: Secondary | ICD-10-CM | POA: Diagnosis not present

## 2019-09-10 DIAGNOSIS — K5730 Diverticulosis of large intestine without perforation or abscess without bleeding: Secondary | ICD-10-CM | POA: Diagnosis not present

## 2019-09-10 DIAGNOSIS — I69320 Aphasia following cerebral infarction: Secondary | ICD-10-CM | POA: Diagnosis not present

## 2019-09-10 DIAGNOSIS — I255 Ischemic cardiomyopathy: Secondary | ICD-10-CM | POA: Diagnosis not present

## 2019-09-10 DIAGNOSIS — I739 Peripheral vascular disease, unspecified: Secondary | ICD-10-CM | POA: Diagnosis not present

## 2019-09-10 DIAGNOSIS — I5022 Chronic systolic (congestive) heart failure: Secondary | ICD-10-CM | POA: Diagnosis not present

## 2019-09-10 DIAGNOSIS — I4811 Longstanding persistent atrial fibrillation: Secondary | ICD-10-CM | POA: Diagnosis not present

## 2019-09-10 DIAGNOSIS — N1831 Chronic kidney disease, stage 3a: Secondary | ICD-10-CM | POA: Diagnosis not present

## 2019-09-10 DIAGNOSIS — I2510 Atherosclerotic heart disease of native coronary artery without angina pectoris: Secondary | ICD-10-CM | POA: Diagnosis not present

## 2019-09-10 DIAGNOSIS — I130 Hypertensive heart and chronic kidney disease with heart failure and stage 1 through stage 4 chronic kidney disease, or unspecified chronic kidney disease: Secondary | ICD-10-CM | POA: Diagnosis not present

## 2019-09-14 DIAGNOSIS — I2510 Atherosclerotic heart disease of native coronary artery without angina pectoris: Secondary | ICD-10-CM | POA: Diagnosis not present

## 2019-09-14 DIAGNOSIS — I739 Peripheral vascular disease, unspecified: Secondary | ICD-10-CM | POA: Diagnosis not present

## 2019-09-14 DIAGNOSIS — I4811 Longstanding persistent atrial fibrillation: Secondary | ICD-10-CM | POA: Diagnosis not present

## 2019-09-14 DIAGNOSIS — I130 Hypertensive heart and chronic kidney disease with heart failure and stage 1 through stage 4 chronic kidney disease, or unspecified chronic kidney disease: Secondary | ICD-10-CM | POA: Diagnosis not present

## 2019-09-14 DIAGNOSIS — I69320 Aphasia following cerebral infarction: Secondary | ICD-10-CM | POA: Diagnosis not present

## 2019-09-14 DIAGNOSIS — K5730 Diverticulosis of large intestine without perforation or abscess without bleeding: Secondary | ICD-10-CM | POA: Diagnosis not present

## 2019-09-14 DIAGNOSIS — N1831 Chronic kidney disease, stage 3a: Secondary | ICD-10-CM | POA: Diagnosis not present

## 2019-09-14 DIAGNOSIS — I255 Ischemic cardiomyopathy: Secondary | ICD-10-CM | POA: Diagnosis not present

## 2019-09-14 DIAGNOSIS — I5022 Chronic systolic (congestive) heart failure: Secondary | ICD-10-CM | POA: Diagnosis not present

## 2019-09-21 DIAGNOSIS — I5022 Chronic systolic (congestive) heart failure: Secondary | ICD-10-CM | POA: Diagnosis not present

## 2019-09-21 DIAGNOSIS — I4811 Longstanding persistent atrial fibrillation: Secondary | ICD-10-CM | POA: Diagnosis not present

## 2019-09-21 DIAGNOSIS — I739 Peripheral vascular disease, unspecified: Secondary | ICD-10-CM | POA: Diagnosis not present

## 2019-09-21 DIAGNOSIS — I69320 Aphasia following cerebral infarction: Secondary | ICD-10-CM | POA: Diagnosis not present

## 2019-09-21 DIAGNOSIS — N1831 Chronic kidney disease, stage 3a: Secondary | ICD-10-CM | POA: Diagnosis not present

## 2019-09-21 DIAGNOSIS — I2510 Atherosclerotic heart disease of native coronary artery without angina pectoris: Secondary | ICD-10-CM | POA: Diagnosis not present

## 2019-09-21 DIAGNOSIS — I130 Hypertensive heart and chronic kidney disease with heart failure and stage 1 through stage 4 chronic kidney disease, or unspecified chronic kidney disease: Secondary | ICD-10-CM | POA: Diagnosis not present

## 2019-09-21 DIAGNOSIS — K5730 Diverticulosis of large intestine without perforation or abscess without bleeding: Secondary | ICD-10-CM | POA: Diagnosis not present

## 2019-09-21 DIAGNOSIS — I255 Ischemic cardiomyopathy: Secondary | ICD-10-CM | POA: Diagnosis not present

## 2019-09-24 ENCOUNTER — Other Ambulatory Visit: Payer: Self-pay | Admitting: Internal Medicine

## 2019-09-24 DIAGNOSIS — I69320 Aphasia following cerebral infarction: Secondary | ICD-10-CM | POA: Diagnosis not present

## 2019-09-24 DIAGNOSIS — I2510 Atherosclerotic heart disease of native coronary artery without angina pectoris: Secondary | ICD-10-CM | POA: Diagnosis not present

## 2019-09-24 DIAGNOSIS — K5730 Diverticulosis of large intestine without perforation or abscess without bleeding: Secondary | ICD-10-CM | POA: Diagnosis not present

## 2019-09-24 DIAGNOSIS — I255 Ischemic cardiomyopathy: Secondary | ICD-10-CM | POA: Diagnosis not present

## 2019-09-24 DIAGNOSIS — I5022 Chronic systolic (congestive) heart failure: Secondary | ICD-10-CM | POA: Diagnosis not present

## 2019-09-24 DIAGNOSIS — I4811 Longstanding persistent atrial fibrillation: Secondary | ICD-10-CM | POA: Diagnosis not present

## 2019-09-24 DIAGNOSIS — I739 Peripheral vascular disease, unspecified: Secondary | ICD-10-CM | POA: Diagnosis not present

## 2019-09-24 DIAGNOSIS — I130 Hypertensive heart and chronic kidney disease with heart failure and stage 1 through stage 4 chronic kidney disease, or unspecified chronic kidney disease: Secondary | ICD-10-CM | POA: Diagnosis not present

## 2019-09-24 DIAGNOSIS — N1831 Chronic kidney disease, stage 3a: Secondary | ICD-10-CM | POA: Diagnosis not present

## 2019-09-28 DIAGNOSIS — I69320 Aphasia following cerebral infarction: Secondary | ICD-10-CM | POA: Diagnosis not present

## 2019-09-28 DIAGNOSIS — I5022 Chronic systolic (congestive) heart failure: Secondary | ICD-10-CM | POA: Diagnosis not present

## 2019-09-28 DIAGNOSIS — I255 Ischemic cardiomyopathy: Secondary | ICD-10-CM | POA: Diagnosis not present

## 2019-09-28 DIAGNOSIS — I130 Hypertensive heart and chronic kidney disease with heart failure and stage 1 through stage 4 chronic kidney disease, or unspecified chronic kidney disease: Secondary | ICD-10-CM | POA: Diagnosis not present

## 2019-09-28 DIAGNOSIS — I2510 Atherosclerotic heart disease of native coronary artery without angina pectoris: Secondary | ICD-10-CM | POA: Diagnosis not present

## 2019-09-28 DIAGNOSIS — K5730 Diverticulosis of large intestine without perforation or abscess without bleeding: Secondary | ICD-10-CM | POA: Diagnosis not present

## 2019-09-28 DIAGNOSIS — N1831 Chronic kidney disease, stage 3a: Secondary | ICD-10-CM | POA: Diagnosis not present

## 2019-09-28 DIAGNOSIS — I739 Peripheral vascular disease, unspecified: Secondary | ICD-10-CM | POA: Diagnosis not present

## 2019-09-28 DIAGNOSIS — I4811 Longstanding persistent atrial fibrillation: Secondary | ICD-10-CM | POA: Diagnosis not present

## 2019-10-01 DIAGNOSIS — K5730 Diverticulosis of large intestine without perforation or abscess without bleeding: Secondary | ICD-10-CM | POA: Diagnosis not present

## 2019-10-01 DIAGNOSIS — I255 Ischemic cardiomyopathy: Secondary | ICD-10-CM | POA: Diagnosis not present

## 2019-10-01 DIAGNOSIS — I5022 Chronic systolic (congestive) heart failure: Secondary | ICD-10-CM | POA: Diagnosis not present

## 2019-10-01 DIAGNOSIS — N1831 Chronic kidney disease, stage 3a: Secondary | ICD-10-CM | POA: Diagnosis not present

## 2019-10-01 DIAGNOSIS — I2510 Atherosclerotic heart disease of native coronary artery without angina pectoris: Secondary | ICD-10-CM | POA: Diagnosis not present

## 2019-10-01 DIAGNOSIS — I130 Hypertensive heart and chronic kidney disease with heart failure and stage 1 through stage 4 chronic kidney disease, or unspecified chronic kidney disease: Secondary | ICD-10-CM | POA: Diagnosis not present

## 2019-10-01 DIAGNOSIS — I69320 Aphasia following cerebral infarction: Secondary | ICD-10-CM | POA: Diagnosis not present

## 2019-10-01 DIAGNOSIS — I739 Peripheral vascular disease, unspecified: Secondary | ICD-10-CM | POA: Diagnosis not present

## 2019-10-01 DIAGNOSIS — I4811 Longstanding persistent atrial fibrillation: Secondary | ICD-10-CM | POA: Diagnosis not present

## 2019-10-11 DIAGNOSIS — I130 Hypertensive heart and chronic kidney disease with heart failure and stage 1 through stage 4 chronic kidney disease, or unspecified chronic kidney disease: Secondary | ICD-10-CM | POA: Diagnosis not present

## 2019-10-11 DIAGNOSIS — I2510 Atherosclerotic heart disease of native coronary artery without angina pectoris: Secondary | ICD-10-CM | POA: Diagnosis not present

## 2019-10-11 DIAGNOSIS — I739 Peripheral vascular disease, unspecified: Secondary | ICD-10-CM | POA: Diagnosis not present

## 2019-10-11 DIAGNOSIS — I69320 Aphasia following cerebral infarction: Secondary | ICD-10-CM | POA: Diagnosis not present

## 2019-10-11 DIAGNOSIS — N1831 Chronic kidney disease, stage 3a: Secondary | ICD-10-CM | POA: Diagnosis not present

## 2019-10-11 DIAGNOSIS — K5730 Diverticulosis of large intestine without perforation or abscess without bleeding: Secondary | ICD-10-CM | POA: Diagnosis not present

## 2019-10-11 DIAGNOSIS — I255 Ischemic cardiomyopathy: Secondary | ICD-10-CM | POA: Diagnosis not present

## 2019-10-11 DIAGNOSIS — I4811 Longstanding persistent atrial fibrillation: Secondary | ICD-10-CM | POA: Diagnosis not present

## 2019-10-11 DIAGNOSIS — I5022 Chronic systolic (congestive) heart failure: Secondary | ICD-10-CM | POA: Diagnosis not present

## 2019-10-15 ENCOUNTER — Other Ambulatory Visit: Payer: Self-pay | Admitting: Cardiovascular Disease

## 2019-10-15 NOTE — Telephone Encounter (Signed)
Pt last saw Jacolyn Reedy, PA on 08/03/19, last labs 08/20/19 Creat 1.07, age 79, weight 84.4kg, based on specified criteria pt is on appropriate dosage of Eliquis 5mg  BID.  Will refill rx.

## 2019-10-21 DIAGNOSIS — I739 Peripheral vascular disease, unspecified: Secondary | ICD-10-CM | POA: Diagnosis not present

## 2019-10-21 DIAGNOSIS — I5022 Chronic systolic (congestive) heart failure: Secondary | ICD-10-CM | POA: Diagnosis not present

## 2019-10-21 DIAGNOSIS — I130 Hypertensive heart and chronic kidney disease with heart failure and stage 1 through stage 4 chronic kidney disease, or unspecified chronic kidney disease: Secondary | ICD-10-CM | POA: Diagnosis not present

## 2019-10-21 DIAGNOSIS — I4811 Longstanding persistent atrial fibrillation: Secondary | ICD-10-CM | POA: Diagnosis not present

## 2019-10-21 DIAGNOSIS — N1831 Chronic kidney disease, stage 3a: Secondary | ICD-10-CM | POA: Diagnosis not present

## 2019-10-21 DIAGNOSIS — I255 Ischemic cardiomyopathy: Secondary | ICD-10-CM | POA: Diagnosis not present

## 2019-10-21 DIAGNOSIS — I2510 Atherosclerotic heart disease of native coronary artery without angina pectoris: Secondary | ICD-10-CM | POA: Diagnosis not present

## 2019-10-21 DIAGNOSIS — I69320 Aphasia following cerebral infarction: Secondary | ICD-10-CM | POA: Diagnosis not present

## 2019-10-21 DIAGNOSIS — K5730 Diverticulosis of large intestine without perforation or abscess without bleeding: Secondary | ICD-10-CM | POA: Diagnosis not present

## 2019-11-02 NOTE — Progress Notes (Deleted)
Cardiology Office Note    Date:  11/02/2019   ID:  Yvonne Bailey, DOB April 11, 1940, MRN [id number]  PCP:  Binnie Rail, MD  Cardiologist: Lauree Chandler, MD EPS: None  No chief complaint on file.   History of Present Illness:  Yvonne Bailey is a 79 y.o. female  with history of CAD status post anterior wall MI in 1992 treated with PCI.  Ejection fraction in 2007 was 45%, cath in 2017 severe three-vessel CAD turned down for CABG so underwent DES to the proximal and mid RCA and proximal circumflex, OM 2, diagonal 1 08/2015 LVEF 40 to 45% on echo 01/2016, also has PAF on Eliquis and had a CVA in 2018 due to noncompliance with Eliquis, hypertension, carotid artery disease, PAD, HLD.   Patient had fluid overload when she ran out of Lasix for 4 months back in April.  I resumed her Lasix and on follow-up she had lost 11 pounds.  Trouble following a low-sodium diet. Ran out of her Toprol and her heart rates going fast on exam 07/2019 I asked them to fill this.   Past Medical History:  Diagnosis Date  . Bilateral leg edema 07/19/2015  . CAD (coronary artery disease)    a. anterior MI s/p PCI in 1992. b. cath 08/2015 with severe three-vessel CAD turned down for CABG and underwent DESx5 to prox Cx/OM2/D1/oRCA/mRCA  . Carotid artery disease ([place])    a. carotid duplex 03/2015 showed 1-39% BICA, normal subclavian arteries, chronically occluded left vertebral, f/u recommended only PRN.  . DVT (deep venous thrombosis) ([place])    X1  . History of nuclear stress test    a. Myoview 6/17: EF 20-25%, mid anteroseptal, apical anterior, apical septal, apical inferior, apical lateral and apical scar, no ischemia, intermediate risk  . HTN (hypertension)   . Hyperlipidemia   . Hypokalemia   . Hypothyroidism   . Ischemic cardiomyopathy    a. Echo 6/17: EF 20-25%, apex appears akinetic, MAC, moderate MR, moderate LAE, mild RVE, trivial PI, PASP 47 mmHg (needs repeat with Definity contrast).  b.  Limited echo with Definity contrast 7/17: EF 25-30%, moderate to severe LAE. c. Limited Echo 2018 showed EF 40-45%.  . Lacunar infarction ([place]) 12/17/2012   Dr Krista Blue, Neurology   . Leukocytosis   . 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 Longstanding persistent atrial fibrillation ([place])   . MI (myocardial infarction) ([place]) 1992  . PAD (peripheral artery disease) ([facility])    Right SFA occlusion, severe disease left CFA and SFA  . Prediabetes 07/28/2016  . Stage 3 chronic kidney disease   . Stroke [facility])    a. 02/2017 in setting of noncompliance with Eliquis  . TIA (transient ischemic attack)   . Tricuspid regurgitation     Past Surgical History:  Procedure Laterality Date  . APPENDECTOMY     at hysterectomy and USO for fibroids, Dr. Ysidro Evert  . CARDIAC CATHETERIZATION  1992   Dr Eustace Quail  . CARDIAC CATHETERIZATION N/A 09/06/2015   Procedure: Right/Left Heart Cath and Coronary Angiography;  Surgeon: Burnell Blanks, MD;  Location: [place] [facility];  Service: Cardiovascular;  Laterality: N/A;  . CARDIAC CATHETERIZATION N/A 09/07/2015   Procedure: Coronary Stent Intervention;  Surgeon: Burnell Blanks, MD;  Location: [place] [facility];  Service: Cardiovascular;  Laterality: N/A;  . COLONOSCOPY     negative; 2008, Dr. Delfin Edis  . fracture LLE     '94; pinned  . IR ANGIO INTRA  EXTRACRAN SEL COM CAROTID INNOMINATE BILAT MOD SED  03/02/2017  . IR ANGIO INTRA EXTRACRAN SEL COM CAROTID INNOMINATE BILAT MOD SED  04/23/2018  . IR ANGIO VERTEBRAL SEL SUBCLAVIAN INNOMINATE BILAT MOD SED  04/23/2018  . IR ANGIO VERTEBRAL SEL VERTEBRAL UNI R MOD SED  03/02/2017  . IR RADIOLOGIST EVAL & MGMT  04/28/2017  . IR US GUIDE VASC ACCESS RIGHT  04/23/2018  . RADIOLOGY WITH ANESTHESIA N/A 03/02/2017   Procedure: RADIOLOGY WITH ANESTHESIA;  Surgeon: Luanne Bras, MD;  Location: [place];  Service: Radiology;  Laterality: N/A;  . TONSILLECTOMY    . TOTAL ABDOMINAL HYSTERECTOMY     & BSO for  fibroids    Current Medications: No outpatient medications have been marked as taking for the 11/03/19 encounter (Appointment) with Imogene Burn, PA-C.     Allergies:   Definity [perflutren lipid microsphere] and Cilostazol   Social History   Socioeconomic History  . Marital status: Married    Spouse name: Yvonne Bailey  . Number of children: 1  . Years of education: college  . Highest education level: Not on file  Occupational History  . Occupation: Pharmacist, hospital    Comment: Retired  Tobacco Use  . Smoking status: Former Smoker    Quit date: 03/11/1990    Years since quitting: 29.6  . Smokeless tobacco: Never Used  . Tobacco comment: smoked [place], up to < 5 cigarettes  Vaping Use  . Vaping Use: Never used  Substance and Sexual Activity  . Alcohol use: No  . Drug use: No  . Sexual activity: Not Currently    Birth control/protection: Surgical  Other Topics Concern  . 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 08/31/09- designated party form signed appointing, husband Belinda Schlichting; ok to leave msg on home phone [phone].      Caffeine Small Amount one cup very rare.   Right handed.   One child- Yvonne Bailey   Social Determinants of Health   Financial Resource Strain:   . Difficulty of Paying Living Expenses: Not on file  Food Insecurity:   . Worried About Charity fundraiser in the Last Year: Not on file  . Ran Out of Food in the Last Year: Not on file  Transportation Needs:   . Lack of Transportation (Medical): Not on file  . Lack of Transportation (Non-Medical): Not on file  Physical Activity:   . Days of Exercise per Week: Not on file  . Minutes of Exercise per Session: Not on file  Stress:   . Feeling of Stress : Not on file  Social Connections:   . Frequency of Communication with Friends and Family: Not on file  . Frequency of Social Gatherings with Friends and Family: Not on file  . Attends  Religious Services: Not on file  . Active Member of Clubs or Organizations: Not on file  . Attends Archivist Meetings: Not on file  . Marital Status: Not on file     Family History:  The patient's ***family history includes Coronary artery disease in her brother; Diabetes in her mother; Hypertension in her mother; Stroke in her brother.   ROS:   Please see the history of present illness.    ROS All other systems reviewed and are negative.   PHYSICAL EXAM:   VS:  There were no vitals taken for this visit.  Physical Exam  GEN: Well nourished, well  developed, in no acute distress  HEENT: normal  Neck: no JVD, carotid bruits, or masses Cardiac:RRR; no murmurs, rubs, or gallops  Respiratory:  clear to auscultation bilaterally, normal work of breathing GI: soft, nontender, nondistended, + BS Ext: without cyanosis, clubbing, or edema, Good distal pulses bilaterally MS: no deformity or atrophy  Skin: warm and dry, no rash Neuro:  Alert and Oriented x 3, Strength and sensation are intact Psych: euthymic mood, full affect  Wt Readings from Last 3 Encounters:  08/20/19 186 lb (84.4 kg)  08/03/19 188 lb 1.9 oz (85.3 kg)  06/29/19 199 lb (90.3 kg)      Studies/Labs Reviewed:   EKG:  EKG is*** ordered today.  The ekg ordered today demonstrates ***  Recent Labs: 08/20/2019: ALT 18; BUN 24; Creatinine, Ser 1.07; Hemoglobin 12.7; Platelets 244.0; Potassium 3.9; Sodium 138; TSH 0.06   Lipid Panel    Component Value Date/Time   CHOL 109 08/20/2019 1444   CHOL 131 06/03/2017 0921   TRIG 64.0 08/20/2019 1444   HDL 35.60 (L) 08/20/2019 1444   HDL 40 06/03/2017 0921   CHOLHDL 3 08/20/2019 1444   VLDL 12.8 08/20/2019 1444   LDLCALC 60 08/20/2019 1444   LDLCALC 72 06/03/2017 0921    Additional studies/ records that were reviewed today include:  Echo 03/03/17: - Procedure narrative: Transthoracic echocardiography. Image   quality was suboptimal. The study was technically  difficult, as a   result of poor sound wave transmission and restricted patient   mobility. Intravenous contrast (agitated saline) was   administered. - Left ventricle: There is a false tendon in the mid LV of no   clinical significance. LVF appears reduced but poor acoustical   windows prevent accurate assessment of EF or wall motion. Tech   reported that patient has allergy to echo contrast. Bubble study   done but poor quality. Consider Cardiac MRI The cavity size was   normal. There was moderate concentric hypertrophy. Systolic   function was normal. Wall motion was normal; there were no   regional wall motion abnormalities. - Aortic valve: Trileaflet; mildly thickened, mildly calcified   leaflets. - Mitral valve: Valve area by continuity equation (using LVOT   flow): 0.86 cm^2. - Left atrium: The atrium was mildly dilated. - Pulmonary arteries: PA peak pressure: 32 mm Hg (S).                 ASSESSMENT:    No diagnosis found.   PLAN:  In order of problems listed above:    CAD status post DES to the ostial mid RCA, proximal circumflex, OM 2, first diagonal 08/2015 after she was felt not to be a candidate for CABG.  On aspirin, statin and beta-blocker-no angina.      Ischemic cardiomyopathy ejection fraction 45% on echo in 2018 on Lotensin Aldactone and Toprol    chronic systolic CHF .  Does not like to follow low-sodium diet but son is helping her.   Persistent atrial fibrillation on Eliquis and Toprol needs CBC and bmet today-ran out of Toprol last week and heart rate was running a little fast on exam.  Advised them to get filled and to be careful about not running out of meds.   History of CVA when not taking her Eliquis properly   Hypertension blood pressure controlled   Hyperlipidemia lipids checked by PCP 02/2019 and LDL was 66 on Lipitor      Medication Adjustments/Labs and Tests Ordered: Current medicines are reviewed at  length with the patient today.   Concerns regarding medicines are outlined above.  Medication changes, Labs and Tests ordered today are listed in the Patient Instructions below. There are no Patient Instructions on file for this visit.   Sumner Boast, PA-C  11/02/2019 2:34 PM    [facility] [address], [place], [place]  [address] Phone: [phone]; Fax: [phone]

## 2019-11-03 ENCOUNTER — Ambulatory Visit: Payer: Medicare HMO | Admitting: Physician Assistant

## 2019-12-02 ENCOUNTER — Ambulatory Visit: Payer: Medicare HMO | Admitting: Physician Assistant

## 2019-12-02 NOTE — Progress Notes (Deleted)
Cardiology Office Note    Date:  12/02/2019   ID:  HEILEY SHAIKH, DOB [birth year], MRN [id number]  PCP:  Binnie Rail, MD  Cardiologist:  Dr. Angelena Form  Chief Complaint: 3 Months follow up  History of Present Illness:   ZEYNA MKRTCHYAN is a 79 y.o. female with hx of CAD, ICM/chronic systolic CHF,  PAF, CVA due to non compliance with Eliquis, HTN, PAD, HLD and CKD seen for follow up.   Hx of CAD status post anterior wall MI in 1992 treated with PCI. Cath in 2017 severe three-vessel CAD turned down for CABG so underwent DES to the proximal and mid RCA and proximal circumflex, OM 2, diagonal 1 6/201. LVEF was 45% by echo.   Hx of non compliance with lasix.   Last seen by Ermalinda Barrios 08/03/19.    Past Medical History:  Diagnosis Date  . Bilateral leg edema 07/19/2015  . CAD (coronary artery disease)    a. anterior MI s/p PCI in 1992. b. cath 08/2015 with severe three-vessel CAD turned down for CABG and underwent DESx5 to prox Cx/OM2/D1/oRCA/mRCA  . Carotid artery disease ([place])    a. carotid duplex 03/2015 showed 1-39% BICA, normal subclavian arteries, chronically occluded left vertebral, f/u recommended only PRN.  . DVT (deep venous thrombosis) ([place])    X1  . History of nuclear stress test    a. Myoview 6/17: EF 20-25%, mid anteroseptal, apical anterior, apical septal, apical inferior, apical lateral and apical scar, no ischemia, intermediate risk  . HTN (hypertension)   . Hyperlipidemia   . Hypokalemia   . Hypothyroidism   . Ischemic cardiomyopathy    a. Echo 6/17: EF 20-25%, apex appears akinetic, MAC, moderate MR, moderate LAE, mild RVE, trivial PI, PASP 47 mmHg (needs repeat with Definity contrast).  b. Limited echo with Definity contrast 7/17: EF 25-30%, moderate to severe LAE. c. Limited Echo 2018 showed EF 40-45%.  . Lacunar infarction ([place]) 12/17/2012   Dr Krista Blue, Neurology   . Leukocytosis   . 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 Longstanding persistent atrial  fibrillation ([place])   . MI (myocardial infarction) ([place]) 1992  . PAD (peripheral artery disease) (H[facility])    Right SFA occlusion, severe disease left CFA and SFA  . Prediabetes 07/28/2016  . Stage 3 chronic kidney disease   . Stroke [facility])    a. 02/2017 in setting of noncompliance with Eliquis  . TIA (transient ischemic attack)   . Tricuspid regurgitation     Past Surgical History:  Procedure Laterality Date  . APPENDECTOMY     at hysterectomy and USO for fibroids, Dr. Ysidro Evert  . CARDIAC CATHETERIZATION  1992   Dr Eustace Quail  . CARDIAC CATHETERIZATION N/A 09/06/2015   Procedure: Right/Left Heart Cath and Coronary Angiography;  Surgeon: Burnell Blanks, MD;  Location: [place] [facility];  Service: Cardiovascular;  Laterality: N/A;  . CARDIAC CATHETERIZATION N/A 09/07/2015   Procedure: Coronary Stent Intervention;  Surgeon: Burnell Blanks, MD;  Location: [place] [facility];  Service: Cardiovascular;  Laterality: N/A;  . COLONOSCOPY     negative; 2008, Dr. Delfin Edis  . fracture LLE     '94; pinned  . IR ANGIO INTRA EXTRACRAN SEL COM CAROTID INNOMINATE BILAT MOD SED  03/02/2017  . IR ANGIO INTRA EXTRACRAN SEL COM CAROTID INNOMINATE BILAT MOD SED  04/23/2018  . IR ANGIO VERTEBRAL SEL SUBCLAVIAN INNOMINATE BILAT MOD SED  04/23/2018  . IR ANGIO VERTEBRAL SEL VERTEBRAL UNI R  MOD SED  03/02/2017  . IR RADIOLOGIST EVAL & MGMT  04/28/2017  . IR US GUIDE VASC ACCESS RIGHT  04/23/2018  . RADIOLOGY WITH ANESTHESIA N/A 03/02/2017   Procedure: RADIOLOGY WITH ANESTHESIA;  Surgeon: Luanne Bras, MD;  Location: [place];  Service: Radiology;  Laterality: N/A;  . TONSILLECTOMY    . TOTAL ABDOMINAL HYSTERECTOMY     & BSO for fibroids    Current Medications: Prior to Admission medications   Medication Sig Start Date End Date Taking? Authorizing Provider  amLODipine (NORVASC) 2.5 MG tablet TAKE 1 TABLET(2.5 MG) BY MOUTH DAILY 09/07/19   Binnie Rail, MD  aspirin EC 81 MG tablet Take 1  tablet (81 mg total) by mouth daily. 04/21/17   Imogene Burn, PA-C  atorvastatin (LIPITOR) 80 MG tablet TAKE 1 TABLET(80 MG) BY MOUTH DAILY 09/24/19   Binnie Rail, MD  benazepril (LOTENSIN) 20 MG tablet TAKE 1 AND 1/2 TABLETS(30 MG) BY MOUTH DAILY 09/24/19   Binnie Rail, MD  brimonidine (ALPHAGAN) 0.2 % ophthalmic solution Place 1 drop into both eyes 3 (three) times daily.  12/24/17   [provider]  dorzolamide-timolol (COSOPT) 22.3-6.8 MG/ML ophthalmic solution Place 1 drop into both eyes 2 (two) times daily.     [provider]  ELIQUIS 5 MG TABS tablet TAKE 1 TABLET TWICE DAILY 10/15/19   Burnell Blanks, MD  furosemide (LASIX) 20 MG tablet Take 1 tablet (20 mg total) by mouth daily. 08/03/19   Imogene Burn, PA-C  levothyroxine (SYNTHROID) 88 MCG tablet Take 1 tablet (88 mcg total) by mouth daily. 08/23/19   Binnie Rail, MD  metoprolol succinate (TOPROL-XL) 50 MG 24 hr tablet Take 1 tablet (50 mg total) by mouth daily. 08/03/19   Imogene Burn, PA-C  nitroGLYCERIN (NITROSTAT) 0.4 MG SL tablet Place 1 tablet (0.4 mg total) under the tongue every 5 (five) minutes as needed for chest pain. 09/08/15   Arbutus Leas, NP  spironolactone (ALDACTONE) 25 MG tablet TAKE 1 TABLET(25 MG) BY MOUTH DAILY 09/07/19   Burns, Claudina Lick, MD  travoprost, benzalkonium, (TRAVATAN) 0.004 % ophthalmic solution Place 1 drop into both eyes at bedtime.    [provider]    Allergies:   Definity [perflutren lipid microsphere] and Cilostazol   Social History   Socioeconomic History  . Marital status: Married    Spouse name: [name]  . Number of children: 1  . Years of education: college  . Highest education level: Not on file  Occupational History  . Occupation: Pharmacist, hospital    Comment: Retired  Tobacco Use  . Smoking status: Former Smoker    Quit date: 03/11/1990    Years since quitting: 29.7  . Smokeless tobacco: Never Used  . Tobacco comment: smoked [place], up to < 5  cigarettes  Vaping Use  . Vaping Use: Never used  Substance and Sexual Activity  . Alcohol use: No  . Drug use: No  . Sexual activity: Not Currently    Birth control/protection: Surgical  Other Topics Concern  . 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 08/31/09- designated party form signed appointing, husband Hydie Langan; ok to leave msg on home phone [phone].      Caffeine Small Amount one cup very rare.   Right handed.   One child- [name]   Social Determinants of Health   Financial Resource Strain:   .  Difficulty of Paying Living Expenses: Not on file  Food Insecurity:   . Worried About Charity fundraiser in the Last Year: Not on file  . Ran Out of Food in the Last Year: Not on file  Transportation Needs:   . Lack of Transportation (Medical): Not on file  . Lack of Transportation (Non-Medical): Not on file  Physical Activity:   . Days of Exercise per Week: Not on file  . Minutes of Exercise per Session: Not on file  Stress:   . Feeling of Stress : Not on file  Social Connections:   . Frequency of Communication with Friends and Family: Not on file  . Frequency of Social Gatherings with Friends and Family: Not on file  . Attends Religious Services: Not on file  . Active Member of Clubs or Organizations: Not on file  . Attends Archivist Meetings: Not on file  . Marital Status: Not on file     Family History:  The patient's family history includes Coronary artery disease in her brother; Diabetes in her mother; Hypertension in her mother; Stroke in her brother. ***  ROS:   Please see the history of present illness.    ROS All other systems reviewed and are negative.   PHYSICAL EXAM:   VS:  There were no vitals taken for this visit.   GEN: Well nourished, well developed, in no acute distress  HEENT: normal  Neck: no JVD, carotid bruits, or masses Cardiac: ***RRR; no murmurs,  rubs, or gallops,no edema  Respiratory:  clear to auscultation bilaterally, normal work of breathing GI: soft, nontender, nondistended, + BS MS: no deformity or atrophy  Skin: warm and dry, no rash Neuro:  Alert and Oriented x 3, Strength and sensation are intact Psych: euthymic mood, full affect  Wt Readings from Last 3 Encounters:  08/20/19 186 lb (84.4 kg)  08/03/19 188 lb 1.9 oz (85.3 kg)  06/29/19 199 lb (90.3 kg)      Studies/Labs Reviewed:   EKG:  EKG is ordered today.  The ekg ordered today demonstrates ***  Recent Labs: 08/20/2019: ALT 18; BUN 24; Creatinine, Ser 1.07; Hemoglobin 12.7; Platelets 244.0; Potassium 3.9; Sodium 138; TSH 0.06   Lipid Panel    Component Value Date/Time   CHOL 109 08/20/2019 1444   CHOL 131 06/03/2017 0921   TRIG 64.0 08/20/2019 1444   HDL 35.60 (L) 08/20/2019 1444   HDL 40 06/03/2017 0921   CHOLHDL 3 08/20/2019 1444   VLDL 12.8 08/20/2019 1444   LDLCALC 60 08/20/2019 1444   LDLCALC 72 06/03/2017 0921    Additional studies/ records that were reviewed today include:   Echocardiogram with bubble study 02/2017 Study Conclusions   - Procedure narrative: Transthoracic echocardiography. Image  quality was suboptimal. The study was technically difficult, as a  result of poor sound wave transmission and restricted patient  mobility. Intravenous contrast (agitated saline) was  administered.  - Left ventricle: There is a false tendon in the mid LV of no  clinical significance. LVF appears reduced but poor acoustical  windows prevent accurate assessment of EF or wall motion. Tech  reported that patient has allergy to echo contrast. Bubble study  done but poor quality. Consider Cardiac MRI The cavity size was  normal. There was moderate concentric hypertrophy. Systolic  function was normal. Wall motion was normal; there were no  regional wall motion abnormalities.  - Aortic valve: Trileaflet; mildly thickened, mildly  calcified  leaflets.  - Mitral valve:  Valve area by continuity equation (using LVOT  flow): 0.86 cm^2.  - Left atrium: The atrium was mildly dilated.  - Pulmonary arteries: PA peak pressure: 32 mm Hg (S).   Recommendations: Consider cardiac MRI to assess EF and wall  motion.   Coronary Stent Intervention 09/07/2015  Conclusion  1. Triple vessel CAD 2. Severe stenosis proximal Circumflex, s/p successful PTCA/DES x 1.  3. Severe stenosis second obtuse marginal, s/p successful PTCA/DES x 1.  4. Severe stenosis diagonal 1, s/p successful PTCA/DES x 1.  5. Severe stenosis ostial RCA, s/p successful PTCA/DES x 1.  6. Severe stenosis mid RCA, s/p successful PTCA/DES x 1.   Recommendations: Will continue ASA and Plavix along with Eliquis for one month. Stop ASA in one month. Will treat with IV Lasix tonight for volume overload post cath. Continue statin and beta blocker.  Diagnostic Dominance: Right  Intervention        ASSESSMENT & PLAN:    1. CAD s/p multiple stent in 2017 (turned down for CABG)  2. ICM/Chronic systolic CHF - EF of 28-24%  3. HTN  4. PAF  5. HLD    Medication Adjustments/Labs and Tests Ordered: Current medicines are reviewed at length with the patient today.  Concerns regarding medicines are outlined above.  Medication changes, Labs and Tests ordered today are listed in the Patient Instructions below. There are no Patient Instructions on file for this visit.   Jarrett Soho, [place]  12/02/2019 10:32 AM    [facility] [address], [place], [place]  [address] Phone: [phone]; Fax: [phone]

## 2019-12-09 ENCOUNTER — Telehealth: Payer: Self-pay | Admitting: Internal Medicine

## 2019-12-09 DIAGNOSIS — F3289 Other specified depressive episodes: Secondary | ICD-10-CM

## 2019-12-09 NOTE — Telephone Encounter (Signed)
Patients son called and was wondering if there is anyway that a referral could be placed in order for his mother to speak with someone, he states that she has been laying in bed depressed, crying and not wanting to do anything.

## 2019-12-10 NOTE — Telephone Encounter (Signed)
Called pt son there was no answer, and can't leave msg due to vm not set-up.Marland KitchenRaechel Chute

## 2019-12-10 NOTE — Telephone Encounter (Signed)
Referral ordered.     If she is depressed - she may benefit from medication - they can also make an appt so we can discuss.

## 2019-12-10 NOTE — Telephone Encounter (Signed)
Tried calling pt son again still no answer and not able to leave vm.Marland KitchenRaechel Chute

## 2019-12-15 ENCOUNTER — Ambulatory Visit: Payer: Medicare HMO | Admitting: Physician Assistant

## 2019-12-16 ENCOUNTER — Telehealth: Payer: Self-pay | Admitting: Internal Medicine

## 2019-12-16 NOTE — Telephone Encounter (Signed)
Spoke to son and relayed message from Dr. Lawerance Bach. Set up an appointment with Dr. Lawerance Bach for Tuesday 10.12.21 to discuss referrals and care plan

## 2019-12-16 NOTE — Progress Notes (Signed)
  Chronic Care Management   Outreach Note  12/16/2019 Name: Yvonne Bailey MRN: [id number] DOB: May 12, 1940  Referred by: Pincus Sanes, MD Reason for referral : No chief complaint on file.   An unsuccessful telephone outreach was attempted today. The patient was referred to the pharmacist for assistance with care management and care coordination.   Follow Up Plan:   Carley Perdue [employer] Scheduler

## 2019-12-20 NOTE — Progress Notes (Signed)
Subjective:    Patient ID: Yvonne Bailey, female    DOB: 03-12-1940, 79 y.o.   MRN: [id number]  HPI The patient is here for an acute visit.  She is here with her son.   She had PT at home and she is able to move around her house without difficulty.  Her son's concern is that although she can move around she does not.  She is very and active and sometimes just lays in bed.  He also feels that she does not get up and go to the bathroom like she should and uses her depends more than she should be.  She does not change her depends when they need to be changed and sometimes will not feel like taking them to the bathroom and will just drop them on the floor and leave them there.  He feels she is having significant number of accidents and is not even trying to get to the bathroom.  He has had to clean up several things because of this.  1 time there was a bowel movement under the mattress pad and she is unsure how that happened, but he is confident that she did that, which he sets him.  She does not watch herself as much as he thinks she should and is not doing anything around the house unless they really bug her to do it.  She is not taking her medications on time consistently.  He feels that she needs someone to come in and help bathe her and get her to the bathroom on a regular basis.  He wonders if she is depressed because she is not motivated to do anything and only does which she feels like.  He thinks she may need a therapist to talk to.  He is not sure what to do at this point.  Medications and allergies reviewed with patient and updated if appropriate.  Patient Active Problem List   Diagnosis Date Noted  . 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 Leukocytosis   . Aphasia as late effect of cerebrovascular accident (CVA)   . Acute ischemic cerebrovascular accident (CVA) involving left middle cerebral artery territory [facility])   . Coronary artery disease involving native coronary artery  without angina pectoris   . Stage 3 chronic kidney disease ([place])   . Prediabetes 07/28/2016  . DOE (dyspnea on exertion) 07/09/2016  . Gait instability 07/09/2016  . Cardiomyopathy, ischemic   . Chronic systolic heart failure ([place]) 08/21/2015  . Bilateral leg edema 07/19/2015  . Atrial fibrillation ([place]) 07/19/2015  . Urinary urgency 03/30/2015  . Lacunar infarction ([place]) 12/17/2012  . Small vessel disease, cerebrovascular 12/17/2012  . CAROTID BRUIT, RIGHT 08/10/2008  . Peripheral vascular disease ([place]) 06/04/2007  . Diverticulosis of large intestine 06/04/2007  . Hypothyroidism 02/24/2007  . Hyperlipidemia 02/24/2007  . Essential hypertension 02/24/2007  . Acute thromboembolism of deep veins of lower extremity ([place]) 01/21/2007  . Coronary atherosclerosis 10/29/2006    Current Outpatient Medications on File Prior to Visit  Medication Sig Dispense Refill  . amLODipine (NORVASC) 2.5 MG tablet TAKE 1 TABLET(2.5 MG) BY MOUTH DAILY 90 tablet 1  . aspirin EC 81 MG tablet Take 1 tablet (81 mg total) by mouth daily. 90 tablet 3  . atorvastatin (LIPITOR) 80 MG tablet TAKE 1 TABLET(80 MG) BY MOUTH DAILY 90 tablet 0  . benazepril (LOTENSIN) 20 MG tablet TAKE 1 AND 1/2 TABLETS(30 MG) BY MOUTH DAILY 135 tablet 1  . brimonidine (ALPHAGAN) 0.2 %  ophthalmic solution Place 1 drop into both eyes 3 (three) times daily.     . dorzolamide-timolol (COSOPT) 22.3-6.8 MG/ML ophthalmic solution Place 1 drop into both eyes 2 (two) times daily.     Marland Kitchen ELIQUIS 5 MG TABS tablet TAKE 1 TABLET TWICE DAILY 180 tablet 1  . furosemide (LASIX) 20 MG tablet Take 1 tablet (20 mg total) by mouth daily. 90 tablet 3  . levothyroxine (SYNTHROID) 88 MCG tablet Take 1 tablet (88 mcg total) by mouth daily. 90 tablet 1  . metoprolol succinate (TOPROL-XL) 50 MG 24 hr tablet Take 1 tablet (50 mg total) by mouth daily. 90 tablet 3  . nitroGLYCERIN (NITROSTAT) 0.4 MG SL tablet Place 1 tablet (0.4 mg total) under the tongue every 5  (five) minutes as needed for chest pain. 25 tablet 1  . spironolactone (ALDACTONE) 25 MG tablet TAKE 1 TABLET(25 MG) BY MOUTH DAILY 90 tablet 1  . travoprost, benzalkonium, (TRAVATAN) 0.004 % ophthalmic solution Place 1 drop into both eyes at bedtime.     No current facility-administered medications on file prior to visit.    Past Medical History:  Diagnosis Date  . Bilateral leg edema 07/19/2015  . CAD (coronary artery disease)    a. anterior MI s/p PCI in 1992. b. cath 08/2015 with severe three-vessel CAD turned down for CABG and underwent DESx5 to prox Cx/OM2/D1/oRCA/mRCA  . Carotid artery disease ([place])    a. carotid duplex 03/2015 showed 1-39% BICA, normal subclavian arteries, chronically occluded left vertebral, f/u recommended only PRN.  . DVT (deep venous thrombosis) ([place])    X1  . History of nuclear stress test    a. Myoview 6/17: EF 20-25%, mid anteroseptal, apical anterior, apical septal, apical inferior, apical lateral and apical scar, no ischemia, intermediate risk  . HTN (hypertension)   . Hyperlipidemia   . Hypokalemia   . Hypothyroidism   . Ischemic cardiomyopathy    a. Echo 6/17: EF 20-25%, apex appears akinetic, MAC, moderate MR, moderate LAE, mild RVE, trivial PI, PASP 47 mmHg (needs repeat with Definity contrast).  b. Limited echo with Definity contrast 7/17: EF 25-30%, moderate to severe LAE. c. Limited Echo 2018 showed EF 40-45%.  . Lacunar infarction ([place]) 12/17/2012   Dr Krista Blue, Neurology   . Leukocytosis   . 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 Longstanding persistent atrial fibrillation ([place])   . MI (myocardial infarction) ([place]) 1992  . PAD (peripheral artery disease) ([facility])    Right SFA occlusion, severe disease left CFA and SFA  . Prediabetes 07/28/2016  . Stage 3 chronic kidney disease ([place])   . Stroke [facility])    a. 02/2017 in setting of noncompliance with Eliquis  . TIA (transient ischemic attack)   . Tricuspid regurgitation     Past Surgical History:    Procedure Laterality Date  . APPENDECTOMY     at hysterectomy and USO for fibroids, Dr. Ysidro Evert  . CARDIAC CATHETERIZATION  1992   Dr Eustace Quail  . CARDIAC CATHETERIZATION N/A 09/06/2015   Procedure: Right/Left Heart Cath and Coronary Angiography;  Surgeon: Burnell Blanks, MD;  Location: [place] [facility];  Service: Cardiovascular;  Laterality: N/A;  . CARDIAC CATHETERIZATION N/A 09/07/2015   Procedure: Coronary Stent Intervention;  Surgeon: Burnell Blanks, MD;  Location: [place] [facility];  Service: Cardiovascular;  Laterality: N/A;  . COLONOSCOPY     negative; 2008, Dr. Delfin Edis  . fracture LLE     '94; pinned  . IR ANGIO INTRA  EXTRACRAN SEL COM CAROTID INNOMINATE BILAT MOD SED  03/02/2017  . IR ANGIO INTRA EXTRACRAN SEL COM CAROTID INNOMINATE BILAT MOD SED  04/23/2018  . IR ANGIO VERTEBRAL SEL SUBCLAVIAN INNOMINATE BILAT MOD SED  04/23/2018  . IR ANGIO VERTEBRAL SEL VERTEBRAL UNI R MOD SED  03/02/2017  . IR RADIOLOGIST EVAL & MGMT  04/28/2017  . IR US GUIDE VASC ACCESS RIGHT  04/23/2018  . RADIOLOGY WITH ANESTHESIA N/A 03/02/2017   Procedure: RADIOLOGY WITH ANESTHESIA;  Surgeon: Luanne Bras, MD;  Location: [place];  Service: Radiology;  Laterality: N/A;  . TONSILLECTOMY    . TOTAL ABDOMINAL HYSTERECTOMY     & BSO for fibroids    Social History   Socioeconomic History  . Marital status: Married    Spouse name: [name]  . Number of children: 1  . Years of education: college  . Highest education level: Not on file  Occupational History  . Occupation: Pharmacist, hospital    Comment: Retired  Tobacco Use  . Smoking status: Former Smoker    Quit date: 03/11/1990    Years since quitting: 29.8  . Smokeless tobacco: Never Used  . Tobacco comment: smoked [place], up to < 5 cigarettes  Vaping Use  . Vaping Use: Never used  Substance and Sexual Activity  . Alcohol use: No  . Drug use: No  . Sexual activity: Not Currently    Birth control/protection: Surgical  Other  Topics Concern  . 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 08/31/09- designated party form signed appointing, husband Yvonne Bailey; ok to leave msg on home phone [phone].      Caffeine Small Amount one cup very rare.   Right handed.   One child- Yvonne Bailey   Social Determinants of Health   Financial Resource Strain:   . Difficulty of Paying Living Expenses: Not on file  Food Insecurity:   . Worried About Charity fundraiser in the Last Year: Not on file  . Ran Out of Food in the Last Year: Not on file  Transportation Needs:   . Lack of Transportation (Medical): Not on file  . Lack of Transportation (Non-Medical): Not on file  Physical Activity:   . Days of Exercise per Week: Not on file  . Minutes of Exercise per Session: Not on file  Stress:   . Feeling of Stress : Not on file  Social Connections:   . Frequency of Communication with Friends and Family: Not on file  . Frequency of Social Gatherings with Friends and Family: Not on file  . Attends Religious Services: Not on file  . Active Member of Clubs or Organizations: Not on file  . Attends Archivist Meetings: Not on file  . Marital Status: Not on file    Family History  Problem Relation Age of Onset  . Diabetes Mother   . Hypertension Mother   . Stroke Brother        ?> 55  . Coronary artery disease Brother        stent in 13s  . Cancer Neg Hx     Review of Systems     Objective:   Vitals:   12/21/19 1548  BP: 126/74  Pulse: 61  Temp: 98.2 F (36.8 C)  SpO2: 98%   BP Readings from Last 3 Encounters:  12/21/19 126/74  08/20/19 124/60  08/03/19 120/72   Wt Readings from Last 3  Encounters:  12/21/19 179 lb (81.2 kg)  08/20/19 186 lb (84.4 kg)  08/03/19 188 lb 1.9 oz (85.3 kg)   Body mass index is 28.89 kg/m.   Physical Exam Constitutional:      General: She is not in acute distress.    Appearance: Normal  appearance. She is not ill-appearing.  HENT:     Head: Normocephalic and atraumatic.  Skin:    General: Skin is warm and dry.  Neurological:     Mental Status: She is alert.  Psychiatric:        Mood and Affect: Mood normal.        Behavior: Behavior normal.            Assessment & Plan:    Over 20 minutes were spent face-to-face with the patient and her son discussing the problems at home and what each of them felt was going on.  Discussed the possibility of lack of motivation, depression, lack of insight.  Discussed things we could consider at this point-evaluation by psychiatry, neuropsychology or seeing a therapist.  Discussed with her going to the bathroom every 2 hours whether or not she needs to go and why it is important that she keeps herself clean and is doing more for herself to remain independent.  Will refer to social worker for further help or assistance for them at home  See Problem List for Assessment and Plan of chronic medical problems.    This visit occurred during the SARS-CoV-2 public health emergency.  Safety protocols were in place, including screening questions prior to the visit, additional usage of staff PPE, and extensive cleaning of exam room while observing appropriate contact time as indicated for disinfecting solutions.

## 2019-12-21 ENCOUNTER — Other Ambulatory Visit: Payer: Self-pay

## 2019-12-21 ENCOUNTER — Encounter: Payer: Self-pay | Admitting: Internal Medicine

## 2019-12-21 ENCOUNTER — Ambulatory Visit (INDEPENDENT_AMBULATORY_CARE_PROVIDER_SITE_OTHER): Payer: Medicare HMO | Admitting: Internal Medicine

## 2019-12-21 VITALS — BP 126/74 | HR 61 | Temp 98.2°F | Ht 66.0 in | Wt 179.0 lb

## 2019-12-21 DIAGNOSIS — Z9189 Other specified personal risk factors, not elsewhere classified: Secondary | ICD-10-CM | POA: Diagnosis not present

## 2019-12-21 DIAGNOSIS — I69320 Aphasia following cerebral infarction: Secondary | ICD-10-CM

## 2019-12-21 NOTE — Assessment & Plan Note (Signed)
Chronic Has completed physical therapy recently and is able to move around her home without difficulty, but does not always do that.  Some of this may be lack of motivation,?  Depression, lack of insight Continue aspirin 81 mg daily, Eliquis and statin.  She is not always taking the Eliquis at the correct times and stressed that to prevent another stroke she needs to take the Eliquis exactly as prescribed Her son would ideally like someone to come and help her-?  Any coverage by insurance We will request social worker to come in and evaluate.  I did discuss with this patient that she may need someone to come in and help for work she is not caring for herself appropriately and her family cannot do it may need to be placed in a nursing facility-hoping that this will help her motivated to make some changes

## 2019-12-21 NOTE — Assessment & Plan Note (Signed)
New problem She does lack motivation to do things around the house and to care for her self properly, including going to the bathroom on a more regular basis to prevent accidents.  This is because some issues at home and his son is quite frustrated She does not seem to understand that she needs to make some changes She adamantly denies depression and does not want any other medication Her son feels she would benefit from talking to someone, which may help-we will refer to a therapist

## 2019-12-21 NOTE — Patient Instructions (Addendum)
You need to use the bathroom every two hours.  Take your medications as prescribed.    A referral was ordered a Child psychotherapist and a Paramedic.

## 2019-12-22 ENCOUNTER — Other Ambulatory Visit: Payer: Self-pay | Admitting: Internal Medicine

## 2019-12-22 ENCOUNTER — Other Ambulatory Visit: Payer: Self-pay

## 2019-12-22 ENCOUNTER — Telehealth: Payer: Self-pay | Admitting: Internal Medicine

## 2019-12-22 NOTE — Progress Notes (Signed)
  Chronic Care Management   Note  12/22/2019 Name: LEILANEE RIGHETTI MRN: [id number] DOB: September 28, 1940  Yvonne Bailey is a 78 y.o. year old female who is a primary care patient of Burns, Bobette Mo, MD. I reached out to [name] by phone today in response to a referral sent by [name]'s PCP, Pincus Sanes, MD.   [name] was given information about Chronic Care Management services today including:  1. CCM service includes personalized support from designated clinical staff supervised by her physician, including individualized plan of care and coordination with other care providers 2. 24/7 contact phone numbers for assistance for urgent and routine care needs. 3. Service will only be billed when office clinical staff spend 20 minutes or more in a month to coordinate care. 4. Only one practitioner may furnish and bill the service in a calendar month. 5. The patient may stop CCM services at any time (effective at the end of the month) by phone call to the office staff.   Patient agreed to services and verbal consent obtained.   Follow up plan:   Carley Perdue [employer] Scheduler

## 2019-12-22 NOTE — Patient Outreach (Signed)
[facility]) Care Management  12/22/2019  Yvonne Bailey [birth year] [id number]   Telephone Screen  Referral Date: 12/22/2019 Referral Source: MD Office Referral Reason: "needs help at home" Insurance: [facility]   Outreach attempt # 1 to patient.. No answer after multiple rings and unable to leave message.      Plan: RN CM will make outreach attempt to patient within 3-4 business days. RN CM will send unsuccessful outreach letter to patient.   Antionette Fairy, RN,BSN,CCM [facility] Care Management Telephonic Care Management Coordinator Direct Phone: [phone] Toll Free: [phone] Fax: [phone]

## 2019-12-23 ENCOUNTER — Other Ambulatory Visit: Payer: Self-pay

## 2019-12-23 NOTE — Patient Outreach (Signed)
[facility]) Care Management  12/23/2019  Yvonne Bailey [birth year] [id number]   Care Coordination   RN CM received message from [facility] administrative assistant that son([name]) had called and requested follow up call. Return call placed to son. No answer after several rings and voicemail not set up.      Antionette Fairy, RN,BSN,CCM [facility] Care Management Telephonic Care Management Coordinator Direct Phone: [phone] Toll Free: [phone] Fax: [phone]

## 2019-12-23 NOTE — Patient Outreach (Signed)
[facility]) Care Management  12/23/2019  Yvonne Bailey 1940/09/11 [id number]   Telephone Screen  Referral Date: 12/22/2019 Referral Source: MD Office Referral Reason: "needs help at home" Insurance: [facility]   Outreach attempt #2 to patient. Spoke with patient and reviewed and discussed referral source and reason. Patient reports she resides in her home. She has two adult granddaughters(in their 20's) that live with her and assist her. She also reports that she has a son that lives nearby who assists her and takes her to appts and fills med planner for her. She states she is fairly independent ADLs  but needs assistance with showering/bathing. Patient openly admits that she could do more for herself but lacks the desire to do so. She gets MOW's along with her family cooks for her. She denies any recent falls. She uses a cane as needed.d patient reports ed that needs someone to help er with personal care and possible light household duties. RN CM discussed with patient that this was not covered by medicare. Patient states she does not think she is eligible for Medicaid. She is unsure if she can afford to pay for these services out of pocket. However, she is agreeable to SW referral for possible assistance ead to discuss ost. She denies any RN CM needs or concerns. She declined need for further RN calls a she voices she doesn't like to talk on the phone much.       Plan: RN CM will send [facility] SW referral for possible resources on in home assistance. RN CM will close RN CM case.    Antionette Fairy, RN,BSN,CCM [facility] Care Management Telephonic Care Management Coordinator Direct Phone: [phone] Toll Free: [phone] Fax: [phone]

## 2019-12-24 ENCOUNTER — Other Ambulatory Visit: Payer: Self-pay

## 2019-12-24 NOTE — Patient Outreach (Signed)
[facility]) Care Management  12/24/2019  Yvonne Bailey [birth year] [id number]   Care Coordination  12/23/19-RN CM received message from [facility] administrative assistant that son([name]) had called and requested follow up call    Second outreach attempt to patient's on without success. No answer and no voicemail.     Antionette Fairy, RN,BSN,CCM [facility] Care Management Telephonic Care Management Coordinator Direct Phone: [phone] Toll Free: [phone] Fax: [phone]

## 2019-12-25 ENCOUNTER — Other Ambulatory Visit: Payer: Self-pay | Admitting: Internal Medicine

## 2019-12-28 ENCOUNTER — Other Ambulatory Visit: Payer: Self-pay | Admitting: *Deleted

## 2019-12-28 NOTE — Patient Outreach (Signed)
[facility]) Care Management  12/28/2019  RAYLENE CARMICKLE June 06, 1940 [id number]  CSW received consult on 12/23/2019 and attempted to reach patient todayfor initial phone assessment.  CSW was unable to reach pt and no voice mail.  CSW will mail pt an Unsuccessful outreach letter and attempt again in 3-4 business days.     Reece Levy, MSW, LCSW Clinical Social Worker  [facility] [phone]

## 2019-12-29 ENCOUNTER — Other Ambulatory Visit: Payer: Self-pay | Admitting: *Deleted

## 2019-12-29 ENCOUNTER — Telehealth: Payer: Self-pay | Admitting: Internal Medicine

## 2019-12-29 DIAGNOSIS — F3289 Other specified depressive episodes: Secondary | ICD-10-CM

## 2019-12-29 NOTE — Patient Outreach (Signed)
[facility]) Care Management  12/29/2019  Yvonne Bailey May 03, 1940 [id number]    CSW received referral on 12/23/2019 and was able to make contact with pt's son on 12/28/2019.  Pt's identity was confirmed. Per son, he is the only child. He has 2 daughters; one of which lives with pt (however is in school and working as well).  Son reports pt as becoming more challenging and difficult at home.  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  Per son, pt's hygeine and ability or willingness to take care of these type ADL's is minimal.  He reports she is "not cleaning herself well" and then is faced with odor, soiled (seating) cushions and linens.  Per son, pt     CSW validated the stressors pt's son is facing as the primary caregiver/overseer of his mother.  CSW discussed at length with him the limitations faced with his mother's needs if she is unwilling to accept the help as well as the possible cost of services.  Pt is not eligible for Medicaid based on income and does not have a Long Term Care insurance policy.  Pt likely would have to pay out of pocket for the care in home to aide in her ADL's.  Pt is already getting meals on wheels which son says works well.  "The meals are frozen and she has to warm them up" which makes her get up and be active.   Per son, pt had Home Health services a few months ago for PT but does not recall  OT's involvement to work on pt's ADL care; bathing, dressing, toileting, etc.  CSW discussed the possibility of arranging this for pt to have in home assessment by [facility] team (RN, PT, OT and SW) to seek additional (non-family) assistance in reminding, re-teaching and encouraging pt.  CSW stressed to son that insurance will have to approve this as will the PCP to order it and that this would be a short-lived service to work on individual goals with pt to gain independence   CSW suggested to son that  they may need to have a frank family intervention of sorts with pt to address the concerns and limitations they are facing with providing care as well as sacrifices being made.  Son has made arrangements for a maid service to come in and clean the home, as well he has a "CNA connection" for a possible private duty care arrangement.  CSW discussed  adult day care, senior centers and other community based services that can be considered and will mail info to him.  CSW stressed to son that as long as pt is competent she can not be forced to leave her home; nor to accept the services in the home so it is important to get her "buy in" on the plans.   CSW will outreach PCP office to update on above assessment and to ask for home health orders to be completed if PCP agrees.   Pt's son voiced appreciation for CSW's time and support;  "venting to you has helped". CSW encouraged son to seek counseling to help him with the stressors and to help find a healthy balance for his well-being.     CSW provided son with contact info and will plan to follow up next week.    Reece Levy, MSW, LCSW Clinical Social Worker  [facility] [phone]

## 2019-12-29 NOTE — Telephone Encounter (Signed)
ordered

## 2019-12-29 NOTE — Telephone Encounter (Signed)
Yvonne Bailey calling states the patient needs to be referred to [facility]

## 2019-12-30 ENCOUNTER — Ambulatory Visit: Payer: Self-pay | Admitting: *Deleted

## 2019-12-31 ENCOUNTER — Other Ambulatory Visit: Payer: Self-pay | Admitting: *Deleted

## 2020-01-01 ENCOUNTER — Other Ambulatory Visit: Payer: Self-pay | Admitting: Internal Medicine

## 2020-01-02 ENCOUNTER — Other Ambulatory Visit: Payer: Self-pay | Admitting: Internal Medicine

## 2020-01-02 DIAGNOSIS — R2681 Unsteadiness on feet: Secondary | ICD-10-CM

## 2020-01-02 DIAGNOSIS — I69320 Aphasia following cerebral infarction: Secondary | ICD-10-CM

## 2020-01-02 NOTE — Progress Notes (Signed)
Referral for home health.

## 2020-01-03 ENCOUNTER — Other Ambulatory Visit: Payer: Self-pay | Admitting: *Deleted

## 2020-01-03 ENCOUNTER — Ambulatory Visit: Payer: Self-pay | Admitting: *Deleted

## 2020-01-03 NOTE — Patient Outreach (Signed)
[facility]) Care Management  01/03/2020  SHAQUANDA GRAVES Jun 08, 1940 [id number]   CSW attempted to make contact with pt's son today to update on message from PCP of Home Health orders placed.  CSW was unable to reach pt/son and left a HIPPA compliant voice message and will await callback or try again in 3-4 business days.   Reece Levy, MSW, LCSW Clinical Social Worker  [facility] [phone]

## 2020-01-05 ENCOUNTER — Other Ambulatory Visit: Payer: Self-pay | Admitting: *Deleted

## 2020-01-05 NOTE — Patient Outreach (Signed)
[facility]) Care Management  01/05/2020  Yvonne Bailey [birth year] [id number]   CSW received call from pt's son inquiring about home health.  CSW attempted to reach son earlier in week to update him of orders being placed for home health per message from PCP.  Per son, they have not heard from an agency yet.  CSW suggested son call PCP office to inquire about what agency it was set up with so he can follow up with them.  CSW also suggested he first check his mother's phone/voicemail for possible outreach attempt to that number.  Pt's son is also wondering about the referral made by PCP office to Psychiatry and will inquire about this as well when he calls.  CSW reminded son to call CSW if needs arise with above or other needs.   Pt's son continues to struggle with the caregiver role of his mother- around the bathroom/hygiene needs.  He is hopeful the HHOT can aide in this area to help pt begin better personal care.  Pt's son also plans to consider some private duty CNA type care/assistance.   Pt' son appreciative of CSW empathy and support.  CSW will plan a follow up call next week.   Reece Levy, MSW, LCSW Clinical Social Worker  [facility] [phone]

## 2020-01-06 ENCOUNTER — Ambulatory Visit: Payer: Self-pay | Admitting: *Deleted

## 2020-01-06 DIAGNOSIS — I69320 Aphasia following cerebral infarction: Secondary | ICD-10-CM | POA: Diagnosis not present

## 2020-01-06 DIAGNOSIS — I5022 Chronic systolic (congestive) heart failure: Secondary | ICD-10-CM | POA: Diagnosis not present

## 2020-01-06 DIAGNOSIS — I739 Peripheral vascular disease, unspecified: Secondary | ICD-10-CM | POA: Diagnosis not present

## 2020-01-06 DIAGNOSIS — I2510 Atherosclerotic heart disease of native coronary artery without angina pectoris: Secondary | ICD-10-CM | POA: Diagnosis not present

## 2020-01-06 DIAGNOSIS — N1831 Chronic kidney disease, stage 3a: Secondary | ICD-10-CM | POA: Diagnosis not present

## 2020-01-06 DIAGNOSIS — I255 Ischemic cardiomyopathy: Secondary | ICD-10-CM | POA: Diagnosis not present

## 2020-01-06 DIAGNOSIS — I130 Hypertensive heart and chronic kidney disease with heart failure and stage 1 through stage 4 chronic kidney disease, or unspecified chronic kidney disease: Secondary | ICD-10-CM | POA: Diagnosis not present

## 2020-01-06 DIAGNOSIS — R2681 Unsteadiness on feet: Secondary | ICD-10-CM | POA: Diagnosis not present

## 2020-01-06 DIAGNOSIS — I4811 Longstanding persistent atrial fibrillation: Secondary | ICD-10-CM | POA: Diagnosis not present

## 2020-01-10 ENCOUNTER — Other Ambulatory Visit: Payer: Self-pay | Admitting: *Deleted

## 2020-01-10 NOTE — Patient Outreach (Signed)
[facility]) Care Management  01/10/2020  Yvonne Bailey [birth year] [id number]   CSW spoke with pt's son who reports he is awaiting OT visit.  He is hopeful a neutral party/non family member may assist pt with following the recommendations and needs of hygiene care.  Per son, pt remains both lazy and stubborn about getting up when she needs to potty and not relying on the depends.  CSW validated son's feelings of frustration related to pt's condition and poor hygiene.   CSW assisting son with paper work process for [facility].  Son to provide info to them and let CSW of other needs related to this to ensure her visits can be arranged soon.   CSW offered support to son and will follow up later in week for updates/progress.   Reece Levy, MSW, LCSW Clinical Social Worker  [facility] [phone]

## 2020-01-12 DIAGNOSIS — I69320 Aphasia following cerebral infarction: Secondary | ICD-10-CM | POA: Diagnosis not present

## 2020-01-12 DIAGNOSIS — I255 Ischemic cardiomyopathy: Secondary | ICD-10-CM | POA: Diagnosis not present

## 2020-01-12 DIAGNOSIS — I130 Hypertensive heart and chronic kidney disease with heart failure and stage 1 through stage 4 chronic kidney disease, or unspecified chronic kidney disease: Secondary | ICD-10-CM | POA: Diagnosis not present

## 2020-01-12 DIAGNOSIS — I2510 Atherosclerotic heart disease of native coronary artery without angina pectoris: Secondary | ICD-10-CM | POA: Diagnosis not present

## 2020-01-12 DIAGNOSIS — N1831 Chronic kidney disease, stage 3a: Secondary | ICD-10-CM | POA: Diagnosis not present

## 2020-01-12 DIAGNOSIS — I5022 Chronic systolic (congestive) heart failure: Secondary | ICD-10-CM | POA: Diagnosis not present

## 2020-01-12 DIAGNOSIS — R2681 Unsteadiness on feet: Secondary | ICD-10-CM | POA: Diagnosis not present

## 2020-01-12 DIAGNOSIS — I739 Peripheral vascular disease, unspecified: Secondary | ICD-10-CM | POA: Diagnosis not present

## 2020-01-12 DIAGNOSIS — I4811 Longstanding persistent atrial fibrillation: Secondary | ICD-10-CM | POA: Diagnosis not present

## 2020-01-13 DIAGNOSIS — R32 Unspecified urinary incontinence: Secondary | ICD-10-CM

## 2020-01-13 DIAGNOSIS — Z86718 Personal history of other venous thrombosis and embolism: Secondary | ICD-10-CM

## 2020-01-13 DIAGNOSIS — I739 Peripheral vascular disease, unspecified: Secondary | ICD-10-CM | POA: Diagnosis not present

## 2020-01-13 DIAGNOSIS — R7303 Prediabetes: Secondary | ICD-10-CM

## 2020-01-13 DIAGNOSIS — I2510 Atherosclerotic heart disease of native coronary artery without angina pectoris: Secondary | ICD-10-CM | POA: Diagnosis not present

## 2020-01-13 DIAGNOSIS — K5730 Diverticulosis of large intestine without perforation or abscess without bleeding: Secondary | ICD-10-CM

## 2020-01-13 DIAGNOSIS — N1831 Chronic kidney disease, stage 3a: Secondary | ICD-10-CM | POA: Diagnosis not present

## 2020-01-13 DIAGNOSIS — I252 Old myocardial infarction: Secondary | ICD-10-CM

## 2020-01-13 DIAGNOSIS — I071 Rheumatic tricuspid insufficiency: Secondary | ICD-10-CM

## 2020-01-13 DIAGNOSIS — I255 Ischemic cardiomyopathy: Secondary | ICD-10-CM | POA: Diagnosis not present

## 2020-01-13 DIAGNOSIS — I130 Hypertensive heart and chronic kidney disease with heart failure and stage 1 through stage 4 chronic kidney disease, or unspecified chronic kidney disease: Secondary | ICD-10-CM | POA: Diagnosis not present

## 2020-01-13 DIAGNOSIS — R2681 Unsteadiness on feet: Secondary | ICD-10-CM | POA: Diagnosis not present

## 2020-01-13 DIAGNOSIS — Z87891 Personal history of nicotine dependence: Secondary | ICD-10-CM

## 2020-01-13 DIAGNOSIS — Z7901 Long term (current) use of anticoagulants: Secondary | ICD-10-CM

## 2020-01-13 DIAGNOSIS — I69320 Aphasia following cerebral infarction: Secondary | ICD-10-CM | POA: Diagnosis not present

## 2020-01-13 DIAGNOSIS — Z7982 Long term (current) use of aspirin: Secondary | ICD-10-CM

## 2020-01-13 DIAGNOSIS — Z951 Presence of aortocoronary bypass graft: Secondary | ICD-10-CM

## 2020-01-13 DIAGNOSIS — I4811 Longstanding persistent atrial fibrillation: Secondary | ICD-10-CM | POA: Diagnosis not present

## 2020-01-13 DIAGNOSIS — E039 Hypothyroidism, unspecified: Secondary | ICD-10-CM

## 2020-01-13 DIAGNOSIS — L280 Lichen simplex chronicus: Secondary | ICD-10-CM

## 2020-01-13 DIAGNOSIS — E7849 Other hyperlipidemia: Secondary | ICD-10-CM

## 2020-01-13 DIAGNOSIS — I5022 Chronic systolic (congestive) heart failure: Secondary | ICD-10-CM | POA: Diagnosis not present

## 2020-01-13 DIAGNOSIS — Z9181 History of falling: Secondary | ICD-10-CM

## 2020-01-18 DIAGNOSIS — R2681 Unsteadiness on feet: Secondary | ICD-10-CM | POA: Diagnosis not present

## 2020-01-18 DIAGNOSIS — I4811 Longstanding persistent atrial fibrillation: Secondary | ICD-10-CM | POA: Diagnosis not present

## 2020-01-18 DIAGNOSIS — I69320 Aphasia following cerebral infarction: Secondary | ICD-10-CM | POA: Diagnosis not present

## 2020-01-18 DIAGNOSIS — I130 Hypertensive heart and chronic kidney disease with heart failure and stage 1 through stage 4 chronic kidney disease, or unspecified chronic kidney disease: Secondary | ICD-10-CM | POA: Diagnosis not present

## 2020-01-18 DIAGNOSIS — I5022 Chronic systolic (congestive) heart failure: Secondary | ICD-10-CM | POA: Diagnosis not present

## 2020-01-18 DIAGNOSIS — I739 Peripheral vascular disease, unspecified: Secondary | ICD-10-CM | POA: Diagnosis not present

## 2020-01-18 DIAGNOSIS — N1831 Chronic kidney disease, stage 3a: Secondary | ICD-10-CM | POA: Diagnosis not present

## 2020-01-18 DIAGNOSIS — I255 Ischemic cardiomyopathy: Secondary | ICD-10-CM | POA: Diagnosis not present

## 2020-01-18 DIAGNOSIS — I2510 Atherosclerotic heart disease of native coronary artery without angina pectoris: Secondary | ICD-10-CM | POA: Diagnosis not present

## 2020-01-19 DIAGNOSIS — I5022 Chronic systolic (congestive) heart failure: Secondary | ICD-10-CM | POA: Diagnosis not present

## 2020-01-19 DIAGNOSIS — I2510 Atherosclerotic heart disease of native coronary artery without angina pectoris: Secondary | ICD-10-CM | POA: Diagnosis not present

## 2020-01-19 DIAGNOSIS — I4811 Longstanding persistent atrial fibrillation: Secondary | ICD-10-CM | POA: Diagnosis not present

## 2020-01-19 DIAGNOSIS — N1831 Chronic kidney disease, stage 3a: Secondary | ICD-10-CM | POA: Diagnosis not present

## 2020-01-19 DIAGNOSIS — I69320 Aphasia following cerebral infarction: Secondary | ICD-10-CM | POA: Diagnosis not present

## 2020-01-19 DIAGNOSIS — I739 Peripheral vascular disease, unspecified: Secondary | ICD-10-CM | POA: Diagnosis not present

## 2020-01-19 DIAGNOSIS — I255 Ischemic cardiomyopathy: Secondary | ICD-10-CM | POA: Diagnosis not present

## 2020-01-19 DIAGNOSIS — R2681 Unsteadiness on feet: Secondary | ICD-10-CM | POA: Diagnosis not present

## 2020-01-19 DIAGNOSIS — I130 Hypertensive heart and chronic kidney disease with heart failure and stage 1 through stage 4 chronic kidney disease, or unspecified chronic kidney disease: Secondary | ICD-10-CM | POA: Diagnosis not present

## 2020-01-21 DIAGNOSIS — I739 Peripheral vascular disease, unspecified: Secondary | ICD-10-CM | POA: Diagnosis not present

## 2020-01-21 DIAGNOSIS — I5022 Chronic systolic (congestive) heart failure: Secondary | ICD-10-CM | POA: Diagnosis not present

## 2020-01-21 DIAGNOSIS — I69320 Aphasia following cerebral infarction: Secondary | ICD-10-CM | POA: Diagnosis not present

## 2020-01-21 DIAGNOSIS — R2681 Unsteadiness on feet: Secondary | ICD-10-CM | POA: Diagnosis not present

## 2020-01-21 DIAGNOSIS — I4811 Longstanding persistent atrial fibrillation: Secondary | ICD-10-CM | POA: Diagnosis not present

## 2020-01-21 DIAGNOSIS — I2510 Atherosclerotic heart disease of native coronary artery without angina pectoris: Secondary | ICD-10-CM | POA: Diagnosis not present

## 2020-01-21 DIAGNOSIS — N1831 Chronic kidney disease, stage 3a: Secondary | ICD-10-CM | POA: Diagnosis not present

## 2020-01-21 DIAGNOSIS — I130 Hypertensive heart and chronic kidney disease with heart failure and stage 1 through stage 4 chronic kidney disease, or unspecified chronic kidney disease: Secondary | ICD-10-CM | POA: Diagnosis not present

## 2020-01-21 DIAGNOSIS — I255 Ischemic cardiomyopathy: Secondary | ICD-10-CM | POA: Diagnosis not present

## 2020-01-25 DIAGNOSIS — I69320 Aphasia following cerebral infarction: Secondary | ICD-10-CM | POA: Diagnosis not present

## 2020-01-25 DIAGNOSIS — I739 Peripheral vascular disease, unspecified: Secondary | ICD-10-CM | POA: Diagnosis not present

## 2020-01-25 DIAGNOSIS — I255 Ischemic cardiomyopathy: Secondary | ICD-10-CM | POA: Diagnosis not present

## 2020-01-25 DIAGNOSIS — I130 Hypertensive heart and chronic kidney disease with heart failure and stage 1 through stage 4 chronic kidney disease, or unspecified chronic kidney disease: Secondary | ICD-10-CM | POA: Diagnosis not present

## 2020-01-25 DIAGNOSIS — I2510 Atherosclerotic heart disease of native coronary artery without angina pectoris: Secondary | ICD-10-CM | POA: Diagnosis not present

## 2020-01-25 DIAGNOSIS — I4811 Longstanding persistent atrial fibrillation: Secondary | ICD-10-CM | POA: Diagnosis not present

## 2020-01-25 DIAGNOSIS — R2681 Unsteadiness on feet: Secondary | ICD-10-CM | POA: Diagnosis not present

## 2020-01-25 DIAGNOSIS — I5022 Chronic systolic (congestive) heart failure: Secondary | ICD-10-CM | POA: Diagnosis not present

## 2020-01-25 DIAGNOSIS — N1831 Chronic kidney disease, stage 3a: Secondary | ICD-10-CM | POA: Diagnosis not present

## 2020-01-26 DIAGNOSIS — R2681 Unsteadiness on feet: Secondary | ICD-10-CM | POA: Diagnosis not present

## 2020-01-26 DIAGNOSIS — I5022 Chronic systolic (congestive) heart failure: Secondary | ICD-10-CM | POA: Diagnosis not present

## 2020-01-26 DIAGNOSIS — N1831 Chronic kidney disease, stage 3a: Secondary | ICD-10-CM | POA: Diagnosis not present

## 2020-01-26 DIAGNOSIS — I130 Hypertensive heart and chronic kidney disease with heart failure and stage 1 through stage 4 chronic kidney disease, or unspecified chronic kidney disease: Secondary | ICD-10-CM | POA: Diagnosis not present

## 2020-01-26 DIAGNOSIS — I255 Ischemic cardiomyopathy: Secondary | ICD-10-CM | POA: Diagnosis not present

## 2020-01-26 DIAGNOSIS — I69320 Aphasia following cerebral infarction: Secondary | ICD-10-CM | POA: Diagnosis not present

## 2020-01-26 DIAGNOSIS — I4811 Longstanding persistent atrial fibrillation: Secondary | ICD-10-CM | POA: Diagnosis not present

## 2020-01-26 DIAGNOSIS — I739 Peripheral vascular disease, unspecified: Secondary | ICD-10-CM | POA: Diagnosis not present

## 2020-01-26 DIAGNOSIS — I2510 Atherosclerotic heart disease of native coronary artery without angina pectoris: Secondary | ICD-10-CM | POA: Diagnosis not present

## 2020-01-27 DIAGNOSIS — I739 Peripheral vascular disease, unspecified: Secondary | ICD-10-CM | POA: Diagnosis not present

## 2020-01-27 DIAGNOSIS — I5022 Chronic systolic (congestive) heart failure: Secondary | ICD-10-CM | POA: Diagnosis not present

## 2020-01-27 DIAGNOSIS — I255 Ischemic cardiomyopathy: Secondary | ICD-10-CM | POA: Diagnosis not present

## 2020-01-27 DIAGNOSIS — I4811 Longstanding persistent atrial fibrillation: Secondary | ICD-10-CM | POA: Diagnosis not present

## 2020-01-27 DIAGNOSIS — I130 Hypertensive heart and chronic kidney disease with heart failure and stage 1 through stage 4 chronic kidney disease, or unspecified chronic kidney disease: Secondary | ICD-10-CM | POA: Diagnosis not present

## 2020-01-27 DIAGNOSIS — I2510 Atherosclerotic heart disease of native coronary artery without angina pectoris: Secondary | ICD-10-CM | POA: Diagnosis not present

## 2020-01-27 DIAGNOSIS — R2681 Unsteadiness on feet: Secondary | ICD-10-CM | POA: Diagnosis not present

## 2020-01-27 DIAGNOSIS — I69320 Aphasia following cerebral infarction: Secondary | ICD-10-CM | POA: Diagnosis not present

## 2020-01-27 DIAGNOSIS — N1831 Chronic kidney disease, stage 3a: Secondary | ICD-10-CM | POA: Diagnosis not present

## 2020-02-01 ENCOUNTER — Telehealth: Payer: Self-pay | Admitting: Internal Medicine

## 2020-02-01 DIAGNOSIS — R2681 Unsteadiness on feet: Secondary | ICD-10-CM | POA: Diagnosis not present

## 2020-02-01 DIAGNOSIS — I130 Hypertensive heart and chronic kidney disease with heart failure and stage 1 through stage 4 chronic kidney disease, or unspecified chronic kidney disease: Secondary | ICD-10-CM | POA: Diagnosis not present

## 2020-02-01 DIAGNOSIS — I69320 Aphasia following cerebral infarction: Secondary | ICD-10-CM | POA: Diagnosis not present

## 2020-02-01 DIAGNOSIS — I5022 Chronic systolic (congestive) heart failure: Secondary | ICD-10-CM | POA: Diagnosis not present

## 2020-02-01 DIAGNOSIS — I4811 Longstanding persistent atrial fibrillation: Secondary | ICD-10-CM | POA: Diagnosis not present

## 2020-02-01 DIAGNOSIS — N1831 Chronic kidney disease, stage 3a: Secondary | ICD-10-CM | POA: Diagnosis not present

## 2020-02-01 DIAGNOSIS — I739 Peripheral vascular disease, unspecified: Secondary | ICD-10-CM | POA: Diagnosis not present

## 2020-02-01 DIAGNOSIS — I2510 Atherosclerotic heart disease of native coronary artery without angina pectoris: Secondary | ICD-10-CM | POA: Diagnosis not present

## 2020-02-01 DIAGNOSIS — I255 Ischemic cardiomyopathy: Secondary | ICD-10-CM | POA: Diagnosis not present

## 2020-02-01 NOTE — Telephone Encounter (Signed)
ok 

## 2020-02-01 NOTE — Telephone Encounter (Signed)
   HH ORDERS   Caller Name: [facility] Agency Name: [facility] Callback Phone #: [phone], ok to leave a message Service Requested: OT, SPEECH EVAL Frequency of Visits: 1x week for 4

## 2020-02-01 NOTE — Telephone Encounter (Signed)
Verbals left on voicemail today. 

## 2020-02-04 DIAGNOSIS — N1831 Chronic kidney disease, stage 3a: Secondary | ICD-10-CM | POA: Diagnosis not present

## 2020-02-04 DIAGNOSIS — I69320 Aphasia following cerebral infarction: Secondary | ICD-10-CM | POA: Diagnosis not present

## 2020-02-04 DIAGNOSIS — I4811 Longstanding persistent atrial fibrillation: Secondary | ICD-10-CM | POA: Diagnosis not present

## 2020-02-04 DIAGNOSIS — R2681 Unsteadiness on feet: Secondary | ICD-10-CM | POA: Diagnosis not present

## 2020-02-04 DIAGNOSIS — I739 Peripheral vascular disease, unspecified: Secondary | ICD-10-CM | POA: Diagnosis not present

## 2020-02-04 DIAGNOSIS — I130 Hypertensive heart and chronic kidney disease with heart failure and stage 1 through stage 4 chronic kidney disease, or unspecified chronic kidney disease: Secondary | ICD-10-CM | POA: Diagnosis not present

## 2020-02-04 DIAGNOSIS — I2510 Atherosclerotic heart disease of native coronary artery without angina pectoris: Secondary | ICD-10-CM | POA: Diagnosis not present

## 2020-02-04 DIAGNOSIS — I5022 Chronic systolic (congestive) heart failure: Secondary | ICD-10-CM | POA: Diagnosis not present

## 2020-02-04 DIAGNOSIS — I255 Ischemic cardiomyopathy: Secondary | ICD-10-CM | POA: Diagnosis not present

## 2020-02-07 DIAGNOSIS — I4811 Longstanding persistent atrial fibrillation: Secondary | ICD-10-CM | POA: Diagnosis not present

## 2020-02-07 DIAGNOSIS — I2510 Atherosclerotic heart disease of native coronary artery without angina pectoris: Secondary | ICD-10-CM | POA: Diagnosis not present

## 2020-02-07 DIAGNOSIS — I69320 Aphasia following cerebral infarction: Secondary | ICD-10-CM | POA: Diagnosis not present

## 2020-02-07 DIAGNOSIS — I5022 Chronic systolic (congestive) heart failure: Secondary | ICD-10-CM | POA: Diagnosis not present

## 2020-02-07 DIAGNOSIS — N1831 Chronic kidney disease, stage 3a: Secondary | ICD-10-CM | POA: Diagnosis not present

## 2020-02-07 DIAGNOSIS — R2681 Unsteadiness on feet: Secondary | ICD-10-CM | POA: Diagnosis not present

## 2020-02-07 DIAGNOSIS — I130 Hypertensive heart and chronic kidney disease with heart failure and stage 1 through stage 4 chronic kidney disease, or unspecified chronic kidney disease: Secondary | ICD-10-CM | POA: Diagnosis not present

## 2020-02-07 DIAGNOSIS — I255 Ischemic cardiomyopathy: Secondary | ICD-10-CM | POA: Diagnosis not present

## 2020-02-07 DIAGNOSIS — I739 Peripheral vascular disease, unspecified: Secondary | ICD-10-CM | POA: Diagnosis not present

## 2020-02-08 DIAGNOSIS — R2681 Unsteadiness on feet: Secondary | ICD-10-CM | POA: Diagnosis not present

## 2020-02-08 DIAGNOSIS — I4811 Longstanding persistent atrial fibrillation: Secondary | ICD-10-CM | POA: Diagnosis not present

## 2020-02-08 DIAGNOSIS — N1831 Chronic kidney disease, stage 3a: Secondary | ICD-10-CM | POA: Diagnosis not present

## 2020-02-08 DIAGNOSIS — I2510 Atherosclerotic heart disease of native coronary artery without angina pectoris: Secondary | ICD-10-CM | POA: Diagnosis not present

## 2020-02-08 DIAGNOSIS — I255 Ischemic cardiomyopathy: Secondary | ICD-10-CM | POA: Diagnosis not present

## 2020-02-08 DIAGNOSIS — I69320 Aphasia following cerebral infarction: Secondary | ICD-10-CM | POA: Diagnosis not present

## 2020-02-08 DIAGNOSIS — I5022 Chronic systolic (congestive) heart failure: Secondary | ICD-10-CM | POA: Diagnosis not present

## 2020-02-08 DIAGNOSIS — I739 Peripheral vascular disease, unspecified: Secondary | ICD-10-CM | POA: Diagnosis not present

## 2020-02-08 DIAGNOSIS — I130 Hypertensive heart and chronic kidney disease with heart failure and stage 1 through stage 4 chronic kidney disease, or unspecified chronic kidney disease: Secondary | ICD-10-CM | POA: Diagnosis not present

## 2020-02-09 DIAGNOSIS — I2510 Atherosclerotic heart disease of native coronary artery without angina pectoris: Secondary | ICD-10-CM | POA: Diagnosis not present

## 2020-02-09 DIAGNOSIS — N1831 Chronic kidney disease, stage 3a: Secondary | ICD-10-CM | POA: Diagnosis not present

## 2020-02-09 DIAGNOSIS — I69320 Aphasia following cerebral infarction: Secondary | ICD-10-CM | POA: Diagnosis not present

## 2020-02-09 DIAGNOSIS — I130 Hypertensive heart and chronic kidney disease with heart failure and stage 1 through stage 4 chronic kidney disease, or unspecified chronic kidney disease: Secondary | ICD-10-CM | POA: Diagnosis not present

## 2020-02-09 DIAGNOSIS — I255 Ischemic cardiomyopathy: Secondary | ICD-10-CM | POA: Diagnosis not present

## 2020-02-09 DIAGNOSIS — I4811 Longstanding persistent atrial fibrillation: Secondary | ICD-10-CM | POA: Diagnosis not present

## 2020-02-09 DIAGNOSIS — I739 Peripheral vascular disease, unspecified: Secondary | ICD-10-CM | POA: Diagnosis not present

## 2020-02-09 DIAGNOSIS — I5022 Chronic systolic (congestive) heart failure: Secondary | ICD-10-CM | POA: Diagnosis not present

## 2020-02-09 DIAGNOSIS — R2681 Unsteadiness on feet: Secondary | ICD-10-CM | POA: Diagnosis not present

## 2020-02-20 NOTE — Progress Notes (Signed)
Subjective:    Patient ID: Yvonne Bailey, female    DOB: May 26, 1940, 79 y.o.   MRN: [id number]   This visit occurred during the SARS-CoV-2 public health emergency.  Safety protocols were in place, including screening questions prior to the visit, additional usage of staff PPE, and extensive cleaning of exam room while observing appropriate contact time as indicated for disinfecting solutions.    HPI She is here for a physical exam.     Medications and allergies reviewed with patient and updated if appropriate.  Patient Active Problem List   Diagnosis Date Noted  . Lack of motivation 12/21/2019  . 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 Leukocytosis   . Aphasia as late effect of cerebrovascular accident (CVA)   . Acute ischemic cerebrovascular accident (CVA) involving left middle cerebral artery territory [facility])   . Coronary artery disease involving native coronary artery without angina pectoris   . Stage 3 chronic kidney disease ([place])   . Prediabetes 07/28/2016  . DOE (dyspnea on exertion) 07/09/2016  . Gait instability 07/09/2016  . Cardiomyopathy, ischemic   . Chronic systolic heart failure ([place]) 08/21/2015  . Bilateral leg edema 07/19/2015  . Atrial fibrillation ([place]) 07/19/2015  . Urinary urgency 03/30/2015  . Lacunar infarction ([place]) 12/17/2012  . Small vessel disease, cerebrovascular 12/17/2012  . CAROTID BRUIT, RIGHT 08/10/2008  . Peripheral vascular disease ([place]) 06/04/2007  . Diverticulosis of large intestine 06/04/2007  . Hypothyroidism 02/24/2007  . Hyperlipidemia 02/24/2007  . Essential hypertension 02/24/2007  . Acute thromboembolism of deep veins of lower extremity ([place]) 01/21/2007  . Coronary atherosclerosis 10/29/2006    Current Outpatient Medications on File Prior to Visit  Medication Sig Dispense Refill  . atorvastatin (LIPITOR) 80 MG tablet TAKE 1 TABLET(80 MG) BY MOUTH DAILY 90 tablet 1  . amLODipine (NORVASC) 2.5 MG tablet TAKE 1 TABLET(2.5  MG) BY MOUTH DAILY Follow-up appt due in Dec must see provider for future refills 90 tablet 0  . aspirin EC 81 MG tablet Take 1 tablet (81 mg total) by mouth daily. 90 tablet 3  . benazepril (LOTENSIN) 20 MG tablet TAKE 1 AND 1/2 TABLETS(30 MG) BY MOUTH DAILY 135 tablet 1  . brimonidine (ALPHAGAN) 0.2 % ophthalmic solution Place 1 drop into both eyes 3 (three) times daily.     . dorzolamide-timolol (COSOPT) 22.3-6.8 MG/ML ophthalmic solution Place 1 drop into both eyes 2 (two) times daily.     Marland Kitchen ELIQUIS 5 MG TABS tablet TAKE 1 TABLET TWICE DAILY 180 tablet 1  . furosemide (LASIX) 20 MG tablet Take 1 tablet (20 mg total) by mouth daily. 90 tablet 3  . levothyroxine (SYNTHROID) 88 MCG tablet TAKE 1 TABLET(88 MCG) BY MOUTH DAILY 90 tablet 1  . metoprolol succinate (TOPROL-XL) 50 MG 24 hr tablet Take 1 tablet (50 mg total) by mouth daily. 90 tablet 3  . nitroGLYCERIN (NITROSTAT) 0.4 MG SL tablet Place 1 tablet (0.4 mg total) under the tongue every 5 (five) minutes as needed for chest pain. 25 tablet 1  . spironolactone (ALDACTONE) 25 MG tablet TAKE 1 TABLET(25 MG) BY MOUTH DAILY 90 tablet 1  . travoprost, benzalkonium, (TRAVATAN) 0.004 % ophthalmic solution Place 1 drop into both eyes at bedtime.     No current facility-administered medications on file prior to visit.    Past Medical History:  Diagnosis Date  . Bilateral leg edema 07/19/2015  . CAD (coronary artery disease)    a. anterior MI s/p PCI in 1992. b. cath  08/2015 with severe three-vessel CAD turned down for CABG and underwent DESx5 to prox Cx/OM2/D1/oRCA/mRCA  . Carotid artery disease ([place])    a. carotid duplex 03/2015 showed 1-39% BICA, normal subclavian arteries, chronically occluded left vertebral, f/u recommended only PRN.  . DVT (deep venous thrombosis) ([place])    X1  . History of nuclear stress test    a. Myoview 6/17: EF 20-25%, mid anteroseptal, apical anterior, apical septal, apical inferior, apical lateral and apical scar, no  ischemia, intermediate risk  . HTN (hypertension)   . Hyperlipidemia   . Hypokalemia   . Hypothyroidism   . Ischemic cardiomyopathy    a. Echo 6/17: EF 20-25%, apex appears akinetic, MAC, moderate MR, moderate LAE, mild RVE, trivial PI, PASP 47 mmHg (needs repeat with Definity contrast).  b. Limited echo with Definity contrast 7/17: EF 25-30%, moderate to severe LAE. c. Limited Echo 2018 showed EF 40-45%.  . Lacunar infarction ([place]) 12/17/2012   Dr Krista Blue, Neurology   . Leukocytosis   . 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 Longstanding persistent atrial fibrillation ([place])   . MI (myocardial infarction) ([place]) 1992  . PAD (peripheral artery disease) ([facility])    Right SFA occlusion, severe disease left CFA and SFA  . Prediabetes 07/28/2016  . Stage 3 chronic kidney disease ([place])   . Stroke [facility])    a. 02/2017 in setting of noncompliance with Eliquis  . TIA (transient ischemic attack)   . Tricuspid regurgitation     Past Surgical History:  Procedure Laterality Date  . APPENDECTOMY     at hysterectomy and USO for fibroids, Dr. Ysidro Evert  . CARDIAC CATHETERIZATION  1992   Dr Eustace Quail  . CARDIAC CATHETERIZATION N/A 09/06/2015   Procedure: Right/Left Heart Cath and Coronary Angiography;  Surgeon: Burnell Blanks, MD;  Location: [place] [facility];  Service: Cardiovascular;  Laterality: N/A;  . CARDIAC CATHETERIZATION N/A 09/07/2015   Procedure: Coronary Stent Intervention;  Surgeon: Burnell Blanks, MD;  Location: [place] [facility];  Service: Cardiovascular;  Laterality: N/A;  . COLONOSCOPY     negative; 2008, Dr. Delfin Edis  . fracture LLE     '94; pinned  . IR ANGIO INTRA EXTRACRAN SEL COM CAROTID INNOMINATE BILAT MOD SED  03/02/2017  . IR ANGIO INTRA EXTRACRAN SEL COM CAROTID INNOMINATE BILAT MOD SED  04/23/2018  . IR ANGIO VERTEBRAL SEL SUBCLAVIAN INNOMINATE BILAT MOD SED  04/23/2018  . IR ANGIO VERTEBRAL SEL VERTEBRAL UNI R MOD SED  03/02/2017  . IR RADIOLOGIST EVAL & MGMT   04/28/2017  . IR US GUIDE VASC ACCESS RIGHT  04/23/2018  . RADIOLOGY WITH ANESTHESIA N/A 03/02/2017   Procedure: RADIOLOGY WITH ANESTHESIA;  Surgeon: Luanne Bras, MD;  Location: [place];  Service: Radiology;  Laterality: N/A;  . TONSILLECTOMY    . TOTAL ABDOMINAL HYSTERECTOMY     & BSO for fibroids    Social History   Socioeconomic History  . Marital status: Married    Spouse name: [name]  . Number of children: 1  . Years of education: college  . Highest education level: Not on file  Occupational History  . Occupation: Pharmacist, hospital    Comment: Retired  Tobacco Use  . Smoking status: Former Smoker    Quit date: 03/11/1990    Years since quitting: 29.9  . Smokeless tobacco: Never Used  . Tobacco comment: smoked [place], up to < 5 cigarettes  Vaping Use  . Vaping Use: Never used  Substance and Sexual  Activity  . Alcohol use: No  . Drug use: No  . Sexual activity: Not Currently    Birth control/protection: Surgical  Other Topics Concern  . 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 08/31/09- designated party form signed appointing, husband Malala Trenkamp; ok to leave msg on home phone [phone].      Caffeine Small Amount one cup very rare.   Right handed.   One child- [name]   Social Determinants of Health   Financial Resource Strain: Not on file  Food Insecurity: Not on file  Transportation Needs: Not on file  Physical Activity: Not on file  Stress: Not on file  Social Connections: Not on file    Family History  Problem Relation Age of Onset  . Diabetes Mother   . Hypertension Mother   . Stroke Brother        ?> 55  . Coronary artery disease Brother        stent in 63s  . Cancer Neg Hx     Review of Systems     Objective:  There were no vitals filed for this visit. There were no vitals filed for this visit. There is no height or weight on file to calculate BMI.  BP Readings  from Last 3 Encounters:  12/21/19 126/74  08/20/19 124/60  08/03/19 120/72    Wt Readings from Last 3 Encounters:  12/21/19 179 lb (81.2 kg)  08/20/19 186 lb (84.4 kg)  08/03/19 188 lb 1.9 oz (85.3 kg)     Physical Exam Constitutional: She appears well-developed and well-nourished. No distress.  HENT:  Head: Normocephalic and atraumatic.  Right Ear: External ear normal. Normal ear canal and TM Left Ear: External ear normal.  Normal ear canal and TM Mouth/Throat: Oropharynx is clear and moist.  Eyes: Conjunctivae and EOM are normal.  Neck: Neck supple. No tracheal deviation present. No thyromegaly present.  No carotid bruit  Cardiovascular: Normal rate, regular rhythm and normal heart sounds.   No murmur heard.  No edema. Pulmonary/Chest: Effort normal and breath sounds normal. No respiratory distress. She has no wheezes. She has no rales.  Breast: deferred   Abdominal: Soft. She exhibits no distension. There is no tenderness.  Lymphadenopathy: She has no cervical adenopathy.  Skin: Skin is warm and dry. She is not diaphoretic.  Psychiatric: She has a normal mood and affect. Her behavior is normal.        Assessment & Plan:   Physical exam: Screening blood work    ordered Immunizations  ? Flu, ? Covid,  Discussed tdap, shingrix Colonoscopy  Na due to age 42  N/a due to age 48  na Dexa   Eye exams   Exercise   Weight   Substance abuse  none      See Problem List for Assessment and Plan of chronic medical problems.      This encounter was created in error - please disregard.

## 2020-02-20 NOTE — Patient Instructions (Signed)
Blood work was ordered.     No immunization administered today.   Medications changes include :     Your prescription(s) have been submitted to your pharmacy.    A referral was ordered for      Someone will call you to schedule an appointment.    Please followup in 6 months    Health Maintenance, Female Adopting a healthy lifestyle and getting preventive care are important in promoting health and wellness. Ask your health care provider about:  The right schedule for you to have regular tests and exams.  Things you can do on your own to prevent diseases and keep yourself healthy. What should I know about diet, weight, and exercise? Eat a healthy diet   Eat a diet that includes plenty of vegetables, fruits, low-fat dairy products, and lean protein.  Do not eat a lot of foods that are high in solid fats, added sugars, or sodium. Maintain a healthy weight Body mass index (BMI) is used to identify weight problems. It estimates body fat based on height and weight. Your health care provider can help determine your BMI and help you achieve or maintain a healthy weight. Get regular exercise Get regular exercise. This is one of the most important things you can do for your health. Most adults should:  Exercise for at least 150 minutes each week. The exercise should increase your heart rate and make you sweat (moderate-intensity exercise).  Do strengthening exercises at least twice a week. This is in addition to the moderate-intensity exercise.  Spend less time sitting. Even light physical activity can be beneficial. Watch cholesterol and blood lipids Have your blood tested for lipids and cholesterol at 79 years of age, then have this test every 5 years. Have your cholesterol levels checked more often if:  Your lipid or cholesterol levels are high.  You are older than 79 years of age.  You are at high risk for heart disease. What should I know about cancer  screening? Depending on your health history and family history, you may need to have cancer screening at various ages. This may include screening for:  Breast cancer.  Cervical cancer.  Colorectal cancer.  Skin cancer.  Lung cancer. What should I know about heart disease, diabetes, and high blood pressure? Blood pressure and heart disease  High blood pressure causes heart disease and increases the risk of stroke. This is more likely to develop in people who have high blood pressure readings, are of African descent, or are overweight.  Have your blood pressure checked: ? Every 3-5 years if you are 18-39 years of age. ? Every year if you are 40 years old or older. Diabetes Have regular diabetes screenings. This checks your fasting blood sugar level. Have the screening done:  Once every three years after age 40 if you are at a normal weight and have a low risk for diabetes.  More often and at a younger age if you are overweight or have a high risk for diabetes. What should I know about preventing infection? Hepatitis B If you have a higher risk for hepatitis B, you should be screened for this virus. Talk with your health care provider to find out if you are at risk for hepatitis B infection. Hepatitis C Testing is recommended for:  Everyone born from 1945 through 1965.  Anyone with known risk factors for hepatitis C. Sexually transmitted infections (STIs)  Get screened for STIs, including gonorrhea and chlamydia, if: ? You are sexually   active and are younger than 79 years of age. ? You are older than 79 years of age and your health care provider tells you that you are at risk for this type of infection. ? Your sexual activity has changed since you were last screened, and you are at increased risk for chlamydia or gonorrhea. Ask your health care provider if you are at risk.  Ask your health care provider about whether you are at high risk for HIV. Your health care provider may  recommend a prescription medicine to help prevent HIV infection. If you choose to take medicine to prevent HIV, you should first get tested for HIV. You should then be tested every 3 months for as long as you are taking the medicine. Pregnancy  If you are about to stop having your period (premenopausal) and you may become pregnant, seek counseling before you get pregnant.  Take 400 to 800 micrograms (mcg) of folic acid every day if you become pregnant.  Ask for birth control (contraception) if you want to prevent pregnancy. Osteoporosis and menopause Osteoporosis is a disease in which the bones lose minerals and strength with aging. This can result in bone fractures. If you are 65 years old or older, or if you are at risk for osteoporosis and fractures, ask your health care provider if you should:  Be screened for bone loss.  Take a calcium or vitamin D supplement to lower your risk of fractures.  Be given hormone replacement therapy (HRT) to treat symptoms of menopause. Follow these instructions at home: Lifestyle  Do not use any products that contain nicotine or tobacco, such as cigarettes, e-cigarettes, and chewing tobacco. If you need help quitting, ask your health care provider.  Do not use street drugs.  Do not share needles.  Ask your health care provider for help if you need support or information about quitting drugs. Alcohol use  Do not drink alcohol if: ? Your health care provider tells you not to drink. ? You are pregnant, may be pregnant, or are planning to become pregnant.  If you drink alcohol: ? Limit how much you use to 0-1 drink a day. ? Limit intake if you are breastfeeding.  Be aware of how much alcohol is in your drink. In the [place] one drink equals one 12 oz bottle of beer (355 mL), one 5 oz glass of wine (148 mL), or one 1 oz glass of hard liquor (44 mL). General instructions  Schedule regular health, dental, and eye exams.  Stay current with your  vaccines.  Tell your health care provider if: ? You often feel depressed. ? You have ever been abused or do not feel safe at home. Summary  Adopting a healthy lifestyle and getting preventive care are important in promoting health and wellness.  Follow your health care provider's instructions about healthy diet, exercising, and getting tested or screened for diseases.  Follow your health care provider's instructions on monitoring your cholesterol and blood pressure. This information is not intended to replace advice given to you by your health care provider. Make sure you discuss any questions you have with your health care provider. Document Revised: 02/18/2018 Document Reviewed: 02/18/2018 Elsevier Patient Education  2020 Elsevier Inc.  

## 2020-02-21 ENCOUNTER — Encounter: Payer: Medicare HMO | Admitting: Internal Medicine

## 2020-02-21 DIAGNOSIS — Z0289 Encounter for other administrative examinations: Secondary | ICD-10-CM

## 2020-02-24 DIAGNOSIS — I739 Peripheral vascular disease, unspecified: Secondary | ICD-10-CM | POA: Diagnosis not present

## 2020-02-24 DIAGNOSIS — I2510 Atherosclerotic heart disease of native coronary artery without angina pectoris: Secondary | ICD-10-CM | POA: Diagnosis not present

## 2020-02-24 DIAGNOSIS — I130 Hypertensive heart and chronic kidney disease with heart failure and stage 1 through stage 4 chronic kidney disease, or unspecified chronic kidney disease: Secondary | ICD-10-CM | POA: Diagnosis not present

## 2020-02-24 DIAGNOSIS — I4811 Longstanding persistent atrial fibrillation: Secondary | ICD-10-CM | POA: Diagnosis not present

## 2020-02-24 DIAGNOSIS — I69320 Aphasia following cerebral infarction: Secondary | ICD-10-CM | POA: Diagnosis not present

## 2020-02-24 DIAGNOSIS — I255 Ischemic cardiomyopathy: Secondary | ICD-10-CM | POA: Diagnosis not present

## 2020-02-24 DIAGNOSIS — N1831 Chronic kidney disease, stage 3a: Secondary | ICD-10-CM | POA: Diagnosis not present

## 2020-02-24 DIAGNOSIS — I5022 Chronic systolic (congestive) heart failure: Secondary | ICD-10-CM | POA: Diagnosis not present

## 2020-02-24 DIAGNOSIS — R2681 Unsteadiness on feet: Secondary | ICD-10-CM | POA: Diagnosis not present

## 2020-02-24 NOTE — Progress Notes (Deleted)
Subjective:    Patient ID: Yvonne Bailey, female    DOB: 1940-04-24, 79 y.o.   MRN: [id number]  HPI The patient is here for an acute visit.  She is here today with her son to discuss adult day care.    Medications and allergies reviewed with patient and updated if appropriate.  Patient Active Problem List   Diagnosis Date Noted  . Lack of motivation 12/21/2019  . 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 Leukocytosis   . Aphasia as late effect of cerebrovascular accident (CVA)   . Acute ischemic cerebrovascular accident (CVA) involving left middle cerebral artery [facility])   . Coronary artery disease involving native coronary artery without angina pectoris   . Stage 3 chronic kidney disease ([place])   . Prediabetes 07/28/2016  . DOE (dyspnea on exertion) 07/09/2016  . Gait instability 07/09/2016  . Cardiomyopathy, ischemic   . Chronic systolic heart failure ([place]) 08/21/2015  . Bilateral leg edema 07/19/2015  . Atrial fibrillation ([place]) 07/19/2015  . Urinary urgency 03/30/2015  . Lacunar infarction ([place]) 12/17/2012  . Small vessel disease, cerebrovascular 12/17/2012  . CAROTID BRUIT, RIGHT 08/10/2008  . Peripheral vascular disease ([place]) 06/04/2007  . Diverticulosis of large intestine 06/04/2007  . Hypothyroidism 02/24/2007  . Essential hypertension 02/24/2007  . Acute thromboembolism of deep veins of lower extremity ([place]) 01/21/2007  . Coronary atherosclerosis 10/29/2006    Current Outpatient Medications on File Prior to Visit  Medication Sig Dispense Refill  . atorvastatin (LIPITOR) 80 MG tablet TAKE 1 TABLET(80 MG) BY MOUTH DAILY 90 tablet 1  . amLODipine (NORVASC) 2.5 MG tablet TAKE 1 TABLET(2.5 MG) BY MOUTH DAILY Follow-up appt due in Dec must see provider for future refills 90 tablet 0  . aspirin EC 81 MG tablet Take 1 tablet (81 mg total) by mouth daily. 90 tablet 3  . benazepril (LOTENSIN) 20 MG tablet TAKE 1 AND 1/2 TABLETS(30 MG) BY MOUTH DAILY 135 tablet  1  . brimonidine (ALPHAGAN) 0.2 % ophthalmic solution Place 1 drop into both eyes 3 (three) times daily.     . dorzolamide-timolol (COSOPT) 22.3-6.8 MG/ML ophthalmic solution Place 1 drop into both eyes 2 (two) times daily.     Marland Kitchen ELIQUIS 5 MG TABS tablet TAKE 1 TABLET TWICE DAILY 180 tablet 1  . furosemide (LASIX) 20 MG tablet Take 1 tablet (20 mg total) by mouth daily. 90 tablet 3  . levothyroxine (SYNTHROID) 88 MCG tablet TAKE 1 TABLET(88 MCG) BY MOUTH DAILY 90 tablet 1  . metoprolol succinate (TOPROL-XL) 50 MG 24 hr tablet Take 1 tablet (50 mg total) by mouth daily. 90 tablet 3  . nitroGLYCERIN (NITROSTAT) 0.4 MG SL tablet Place 1 tablet (0.4 mg total) under the tongue every 5 (five) minutes as needed for chest pain. 25 tablet 1  . spironolactone (ALDACTONE) 25 MG tablet TAKE 1 TABLET(25 MG) BY MOUTH DAILY 90 tablet 1  . travoprost, benzalkonium, (TRAVATAN) 0.004 % ophthalmic solution Place 1 drop into both eyes at bedtime.     No current facility-administered medications on file prior to visit.    Past Medical History:  Diagnosis Date  . Bilateral leg edema 07/19/2015  . CAD (coronary artery disease)    a. anterior MI s/p PCI in 1992. b. cath 08/2015 with severe three-vessel CAD turned down for CABG and underwent DESx5 to prox Cx/OM2/D1/oRCA/mRCA  . Carotid artery disease ([place])    a. carotid duplex 03/2015 showed 1-39% BICA, normal subclavian arteries, chronically occluded left vertebral,  f/u recommended only PRN.  . DVT (deep venous thrombosis) ([place])    X1  . History of nuclear stress test    a. Myoview 6/17: EF 20-25%, mid anteroseptal, apical anterior, apical septal, apical inferior, apical lateral and apical scar, no ischemia, intermediate risk  . HTN (hypertension)   . Hyperlipidemia   . Hypokalemia   . Hypothyroidism   . Ischemic cardiomyopathy    a. Echo 6/17: EF 20-25%, apex appears akinetic, MAC, moderate MR, moderate LAE, mild RVE, trivial PI, PASP 47 mmHg (needs repeat with  Definity contrast).  b. Limited echo with Definity contrast 7/17: EF 25-30%, moderate to severe LAE. c. Limited Echo 2018 showed EF 40-45%.  . Lacunar infarction ([place]) 12/17/2012   Dr Krista Blue, Neurology   . Leukocytosis   . 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 Longstanding persistent atrial fibrillation ([place])   . MI (myocardial infarction) ([place]) 1992  . PAD (peripheral artery disease) ([facility])    Right SFA occlusion, severe disease left CFA and SFA  . Prediabetes 07/28/2016  . Stage 3 chronic kidney disease ([place])   . Stroke [facility])    a. 02/2017 in setting of noncompliance with Eliquis  . TIA (transient ischemic attack)   . Tricuspid regurgitation     Past Surgical History:  Procedure Laterality Date  . APPENDECTOMY     at hysterectomy and USO for fibroids, Dr. Ysidro Evert  . CARDIAC CATHETERIZATION  1992   Dr Eustace Quail  . CARDIAC CATHETERIZATION N/A 09/06/2015   Procedure: Right/Left Heart Cath and Coronary Angiography;  Surgeon: Burnell Blanks, MD;  Location: [facility];  Service: Cardiovascular;  Laterality: N/A;  . CARDIAC CATHETERIZATION N/A 09/07/2015   Procedure: Coronary Stent Intervention;  Surgeon: Burnell Blanks, MD;  Location: [facility];  Service: Cardiovascular;  Laterality: N/A;  . COLONOSCOPY     negative; 2008, Dr. Delfin Edis  . fracture LLE     '94; pinned  . IR ANGIO INTRA EXTRACRAN SEL COM CAROTID INNOMINATE BILAT MOD SED  03/02/2017  . IR ANGIO INTRA EXTRACRAN SEL COM CAROTID INNOMINATE BILAT MOD SED  04/23/2018  . IR ANGIO VERTEBRAL SEL SUBCLAVIAN INNOMINATE BILAT MOD SED  04/23/2018  . IR ANGIO VERTEBRAL SEL VERTEBRAL UNI R MOD SED  03/02/2017  . IR RADIOLOGIST EVAL & MGMT  04/28/2017  . IR US GUIDE VASC ACCESS RIGHT  04/23/2018  . RADIOLOGY WITH ANESTHESIA N/A 03/02/2017   Procedure: RADIOLOGY WITH ANESTHESIA;  Surgeon: Luanne Bras, MD;  Location: [facility];  Service: Radiology;  Laterality: N/A;  . TONSILLECTOMY    . TOTAL ABDOMINAL  HYSTERECTOMY     & BSO for fibroids    Social History   Socioeconomic History  . Marital status: Married    Spouse name: [name]  . Number of children: 1  . Years of education: college  . Highest education level: Not on file  Occupational History  . Occupation: Pharmacist, hospital    Comment: Retired  Tobacco Use  . Smoking status: Former Smoker    Quit date: 03/11/1990    Years since quitting: 29.9  . Smokeless tobacco: Never Used  . Tobacco comment: smoked [place], up to < 5 cigarettes  Vaping Use  . Vaping Use: Never used  Substance and Sexual Activity  . Alcohol use: No  . Drug use: No  . Sexual activity: Not Currently    Birth control/protection: Surgical  Other Topics Concern  . 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 08/31/09- designated party form signed appointing, husband Chaselynn Kepple; ok to leave msg on home phone [phone].      Caffeine Small Amount one cup very rare.   Right handed.   One child- [name]   Social Determinants of Health   Financial Resource Strain: Not on file  Food Insecurity: Not on file  Transportation Needs: Not on file  Physical Activity: Not on file  Stress: Not on file  Social Connections: Not on file    Family History  Problem Relation Age of Onset  . Diabetes Mother   . Hypertension Mother   . Stroke Brother        ?> 55  . Coronary artery disease Brother        stent in 61s  . Cancer Neg Hx     Review of Systems     Objective:  There were no vitals filed for this visit. BP Readings from Last 3 Encounters:  12/21/19 126/74  08/20/19 124/60  08/03/19 120/72   Wt Readings from Last 3 Encounters:  12/21/19 179 lb (81.2 kg)  08/20/19 186 lb (84.4 kg)  08/03/19 188 lb 1.9 oz (85.3 kg)   There is no height or weight on file to calculate BMI.   Physical Exam         Assessment & Plan:    See Problem List for Assessment and Plan of chronic  medical problems.    This visit occurred during the SARS-CoV-2 public health emergency.  Safety protocols were in place, including screening questions prior to the visit, additional usage of staff PPE, and extensive cleaning of exam room while observing appropriate contact time as indicated for disinfecting solutions.

## 2020-02-25 ENCOUNTER — Ambulatory Visit: Payer: Medicare HMO | Admitting: Internal Medicine

## 2020-02-25 DIAGNOSIS — Z0289 Encounter for other administrative examinations: Secondary | ICD-10-CM

## 2020-02-26 ENCOUNTER — Other Ambulatory Visit: Payer: Self-pay | Admitting: Cardiovascular Disease

## 2020-02-28 ENCOUNTER — Ambulatory Visit: Payer: Medicare HMO

## 2020-02-28 NOTE — Chronic Care Management (AMB) (Deleted)
Chronic Care Management Pharmacy  Name: Yvonne Bailey  MRN: [id number] DOB: 1940/06/25   Chief Complaint/ HPI  Yvonne Bailey,  79 y.o. , female presents for her Initial CCM visit with the clinical pharmacist via telephone due to COVID-19 Pandemic.  PCP : Binnie Rail, MD Patient Care Team: Binnie Rail, MD as PCP - General (Internal Medicine) Burnell Blanks, MD as PCP - Cardiology (Cardiology) Burnell Blanks, MD as Consulting Physician (Cardiology) Charlton Haws, [facility] as Pharmacist (Pharmacist)  Patient's chronic conditions include: Hypertension, Atrial Fibrillation, Heart Failure, Coronary Artery Disease, Chronic Kidney Disease, Hypothyroidism and Overactive Bladder, Hx CVA (08/2017), PVD, Hx DVT  Office Visits: 12/21/19 Dr Quay Burow OV: mental health assessment. Pt is able to move around and do things but does not. Referred to social work to evaluate assistance needs. Referred to therapist for lack of motivation. Advised to use bathroom every 2 hours no matter what. Take meds (esp Eliquis) exactly as prescribed.  08/20/19 Dr Quay Burow OV: chronic f/u. TSH low, reduced levothyroxine to 88 mcg. Ordered home PT to help with gait instability.  Consult Visit: 08/03/19 PA Ermalinda Barrios (cardiology): hx CVA due to Eliquis noncompliance. No med changes.   Subjective: ***  Objective: Allergies  Allergen Reactions  . Definity [Perflutren Lipid Microsphere] Other (See Comments)    Patient experienced back pain with injection  . Cilostazol Other (See Comments)     Pletal :" head felt funny"    Medications: Outpatient Encounter Medications as of 02/28/2020  Medication Sig  . atorvastatin (LIPITOR) 80 MG tablet TAKE 1 TABLET(80 MG) BY MOUTH DAILY  . amLODipine (NORVASC) 2.5 MG tablet TAKE 1 TABLET(2.5 MG) BY MOUTH DAILY Follow-up appt due in Dec must see provider for future refills  . aspirin EC 81 MG tablet Take 1 tablet (81 mg total) by mouth daily.   . benazepril (LOTENSIN) 20 MG tablet TAKE 1 AND 1/2 TABLETS(30 MG) BY MOUTH DAILY  . brimonidine (ALPHAGAN) 0.2 % ophthalmic solution Place 1 drop into both eyes 3 (three) times daily.   . dorzolamide-timolol (COSOPT) 22.3-6.8 MG/ML ophthalmic solution Place 1 drop into both eyes 2 (two) times daily.   Marland Kitchen ELIQUIS 5 MG TABS tablet TAKE 1 TABLET TWICE DAILY  . furosemide (LASIX) 20 MG tablet Take 1 tablet (20 mg total) by mouth daily.  Marland Kitchen levothyroxine (SYNTHROID) 88 MCG tablet TAKE 1 TABLET(88 MCG) BY MOUTH DAILY  . metoprolol succinate (TOPROL-XL) 50 MG 24 hr tablet Take 1 tablet (50 mg total) by mouth daily.  . nitroGLYCERIN (NITROSTAT) 0.4 MG SL tablet Place 1 tablet (0.4 mg total) under the tongue every 5 (five) minutes as needed for chest pain.  Marland Kitchen spironolactone (ALDACTONE) 25 MG tablet TAKE 1 TABLET(25 MG) BY MOUTH DAILY  . travoprost, benzalkonium, (TRAVATAN) 0.004 % ophthalmic solution Place 1 drop into both eyes at bedtime.   No facility-administered encounter medications on file as of 02/28/2020.    Wt Readings from Last 3 Encounters:  12/21/19 179 lb (81.2 kg)  08/20/19 186 lb (84.4 kg)  08/03/19 188 lb 1.9 oz (85.3 kg)    Lab Results  Component Value Date   CREATININE 1.07 08/20/2019   BUN 24 (H) 08/20/2019   GFR 59.84 (L) 08/20/2019   GFRNONAA 52 (L) 08/03/2019   GFRAA 60 08/03/2019   NA 138 08/20/2019   K 3.9 08/20/2019   CALCIUM 9.5 08/20/2019   CO2 27 08/20/2019     Current Diagnosis/Assessment:    Goals Addressed  None     Heart Failure / Hypertension   Type: Systolic  Last ejection fraction: 40-45% (02/08/2016) NYHA Class: II (slight limitation of activity)   BP goal is:  <130/80 BP Readings from Last 3 Encounters:  12/21/19 126/74  08/20/19 124/60  08/03/19 120/72   Patient checks BP at home {CHL HP BP Monitoring Frequency:[phone]} Patient home BP readings are ranging: ***  Patient has failed these meds in past: *** Patient is  currently {CHL Controlled/Uncontrolled:7797079916} on the following medications:  . Amlodipine 2.5 mg daily . Benazepril 20 mg daily . Furosemide 20 mg daily . Metoprolol succinate 50 mg daily . Spironolactone 25 mg daily  We discussed {CHL HP Upstream Pharmacy discussion:[phone]}  Plan  Continue {CHL HP Upstream Pharmacy Plans:[phone]}   AFIB   Patient is currently rate controlled. Office heart rates are  Pulse Readings from Last 3 Encounters:  12/21/19 61  08/20/19 79  08/03/19 72   CHA2DS2-VASc Score = 8  The patient's score is based upon: CHF History: Yes HTN History: Yes Diabetes History: No Stroke History: Yes Vascular Disease History: Yes Age Score: 2 Gender Score: 1  Patient has failed these meds in past: *** Patient is currently {CHL Controlled/Uncontrolled:7797079916} on the following medications:  Marland Kitchen Metoprolol succinate 50 mg daily . Eliquis 5 mg BID  We discussed:  {CHL HP Upstream Pharmacy discussion:[phone]}  Plan  Continue {CHL HP Upstream Pharmacy Plans:[phone]}   Hyperlipidemia   LDL goal < 70 Hx CAD, CVA, PVD  Last lipids Lab Results  Component Value Date   CHOL 109 08/20/2019   HDL 35.60 (L) 08/20/2019   LDLCALC 60 08/20/2019   TRIG 64.0 08/20/2019   CHOLHDL 3 08/20/2019   Hepatic Function Latest Ref Rng & Units 08/20/2019 02/09/2019 04/13/2018  Total Protein 6.0 - 8.3 g/dL 7.1 6.9 6.5  Albumin 3.5 - 5.2 g/dL 4.1 4.0 4.1  AST 0 - 37 U/L $Remo'26 21 22  'VFPWT$ ALT 0 - 35 U/L $Remo'18 24 21  'josAm$ Alk Phosphatase 39 - 117 U/L 125(H) 125(H) 137(H)  Total Bilirubin 0.2 - 1.2 mg/dL 0.8 0.8 0.7  Bilirubin, Direct 0.00 - 0.40 mg/dL - - -     The ASCVD Risk score Mikey Bussing DC Jr., et al., 2013) failed to calculate for the following reasons:   The patient has a prior MI or stroke diagnosis   Patient has failed these meds in past: *** Patient is currently {CHL Controlled/Uncontrolled:7797079916} on the following medications:  . Atorvastatin 80 mg  daily . Aspirin 81 mg daily . Nitroglycerin 0.4 mg SL prn  We discussed:  {CHL HP Upstream Pharmacy discussion:[phone]}  Plan  Continue {CHL HP Upstream Pharmacy Plans:[phone]}  Hypothyroidism   Lab Results  Component Value Date/Time   TSH 0.06 (L) 08/20/2019 02:44 PM   TSH 0.01 (L) 02/09/2019 04:25 PM   FREET4 0.6 11/27/2006 08:45 AM   Patient has failed these meds in past: *** Patient is currently {CHL Controlled/Uncontrolled:7797079916} on the following medications:  . Levothyroxine 88 mcg daily  We discussed:  {CHL HP Upstream Pharmacy discussion:[phone]}  Plan  Continue {CHL HP Upstream Pharmacy Plans:[phone]}  Glaucoma   Patient has failed these meds in past: *** Patient is currently {CHL Controlled/Uncontrolled:7797079916} on the following medications:  . Brimonidine (Alphagan) 0.2% eye drops TID . Dorzolamide-timolol (Cosopt) eye drops BID . Travoprost (Travatan) 0.004% eye drops HS  We discussed:  ***  Plan  Continue {CHL HP Upstream Pharmacy NLZJQ:[id number]}  Vaccines   Reviewed and discussed patient's vaccination history.  Immunization History  Administered Date(s) Administered  . Pneumococcal Conjugate-13 12/15/2014  . Pneumococcal Polysaccharide-23 08/31/2009    Plan  Recommended patient receive *** vaccine in *** office.   Medication Management   Patient's preferred pharmacy is:  [id number] [address], [address] AT [facility] [address] [place] [address] Phone: [phone] Fax: [phone]  Uses pill box? {Yes or If no, why not?:20788} Pt endorses ***% compliance  We discussed: {Pharmacy options:24294}  Plan  {US Pharmacy BWIO:03559}    Follow up: *** month phone visit  ***

## 2020-02-28 NOTE — Telephone Encounter (Signed)
Pt last saw Jacolyn Reedy, PA on 08/03/19, last labs 08/20/19 Creat 1.07, age 79, weight 81.2kg, based on specified criteria pt is on appropriate dosage of Eliquis 5mg  BID.  Will refill rx.

## 2020-03-09 ENCOUNTER — Telehealth: Payer: Self-pay | Admitting: Pharmacist

## 2020-03-09 NOTE — Progress Notes (Signed)
    Chronic Care Management Pharmacy Assistant   Name: Yvonne Bailey  MRN: [id number] DOB: 06-01-1940  Reason for Encounter: Chart Review   PCP : Pincus Sanes, MD  Allergies:   Allergies  Allergen Reactions  . Definity [Perflutren Lipid Microsphere] Other (See Comments)    Patient experienced back pain with injection  . Cilostazol Other (See Comments)     Pletal :" head felt funny"    Medications: Outpatient Encounter Medications as of 03/09/2020  Medication Sig  . atorvastatin (LIPITOR) 80 MG tablet TAKE 1 TABLET(80 MG) BY MOUTH DAILY  . amLODipine (NORVASC) 2.5 MG tablet TAKE 1 TABLET(2.5 MG) BY MOUTH DAILY Follow-up appt due in Dec must see provider for future refills  . aspirin EC 81 MG tablet Take 1 tablet (81 mg total) by mouth daily.  . benazepril (LOTENSIN) 20 MG tablet TAKE 1 AND 1/2 TABLETS(30 MG) BY MOUTH DAILY  . brimonidine (ALPHAGAN) 0.2 % ophthalmic solution Place 1 drop into both eyes 3 (three) times daily.   . dorzolamide-timolol (COSOPT) 22.3-6.8 MG/ML ophthalmic solution Place 1 drop into both eyes 2 (two) times daily.   Marland Kitchen ELIQUIS 5 MG TABS tablet TAKE 1 TABLET TWICE DAILY  . furosemide (LASIX) 20 MG tablet Take 1 tablet (20 mg total) by mouth daily.  Marland Kitchen levothyroxine (SYNTHROID) 88 MCG tablet TAKE 1 TABLET(88 MCG) BY MOUTH DAILY  . metoprolol succinate (TOPROL-XL) 50 MG 24 hr tablet Take 1 tablet (50 mg total) by mouth daily.  . nitroGLYCERIN (NITROSTAT) 0.4 MG SL tablet Place 1 tablet (0.4 mg total) under the tongue every 5 (five) minutes as needed for chest pain.  Marland Kitchen spironolactone (ALDACTONE) 25 MG tablet TAKE 1 TABLET(25 MG) BY MOUTH DAILY  . travoprost, benzalkonium, (TRAVATAN) 0.004 % ophthalmic solution Place 1 drop into both eyes at bedtime.   No facility-administered encounter medications on file as of 03/09/2020.    Current Diagnosis: Patient Active Problem List   Diagnosis Date Noted  . Lack of motivation 12/21/2019  . 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 Leukocytosis   . Aphasia as late effect of cerebrovascular accident (CVA)   . Acute ischemic cerebrovascular accident (CVA) involving left middle cerebral artery territory [facility])   . Coronary artery disease involving native coronary artery without angina pectoris   . Stage 3 chronic kidney disease (HCC)   . Prediabetes 07/28/2016  . DOE (dyspnea on exertion) 07/09/2016  . Gait instability 07/09/2016  . Cardiomyopathy, ischemic   . Chronic systolic heart failure (HCC) 08/21/2015  . Bilateral leg edema 07/19/2015  . Atrial fibrillation (HCC) 07/19/2015  . Urinary urgency 03/30/2015  . 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  . Small vessel disease, cerebrovascular 12/17/2012  . CAROTID BRUIT, RIGHT 08/10/2008  . Peripheral vascular disease (HCC) 06/04/2007  . Diverticulosis of large intestine 06/04/2007  . Hypothyroidism 02/24/2007  . Essential hypertension 02/24/2007  . Acute thromboembolism of deep veins of lower extremity (HCC) 01/21/2007  . Coronary atherosclerosis 10/29/2006    Goals Addressed   None     Follow-Up:  Pharmacist Review   Reviewed chart for medication changes and adherence.  No office visits, consults or hospital visits since last coordination with the clinical pharmacist Mardella Layman. No medication changes were made  No gaps in adherence identified. Patient has follow up scheduled with pharmacy team. No further action required.   Horald Pollen, Clinical Pharmacist Assistant [facility]

## 2020-03-19 NOTE — Progress Notes (Signed)
Subjective:    Patient ID: Yvonne Bailey, female    DOB: 10/08/1940, 80 y.o.   MRN: [id number]   This visit occurred during the SARS-CoV-2 public health emergency.  Safety protocols were in place, including screening questions prior to the visit, additional usage of staff PPE, and extensive cleaning of exam room while observing appropriate contact time as indicated for disinfecting solutions.    HPI She is here for a physical exam.     Medications and allergies reviewed with patient and updated if appropriate.  Patient Active Problem List   Diagnosis Date Noted  . Lack of motivation 12/21/2019  . 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 Leukocytosis   . Aphasia as late effect of cerebrovascular accident (CVA)   . Acute ischemic cerebrovascular accident (CVA) involving left middle cerebral artery territory [facility])   . Coronary artery disease involving native coronary artery without angina pectoris   . Stage 3 chronic kidney disease ([place])   . Prediabetes 07/28/2016  . DOE (dyspnea on exertion) 07/09/2016  . Gait instability 07/09/2016  . Cardiomyopathy, ischemic   . Chronic systolic heart failure ([place]) 08/21/2015  . Bilateral leg edema 07/19/2015  . Atrial fibrillation ([place]) 07/19/2015  . Urinary urgency 03/30/2015  . Lacunar infarction ([place]) 12/17/2012  . Small vessel disease, cerebrovascular 12/17/2012  . CAROTID BRUIT, RIGHT 08/10/2008  . Peripheral vascular disease ([place]) 06/04/2007  . Diverticulosis of large intestine 06/04/2007  . Hypothyroidism 02/24/2007  . Essential hypertension 02/24/2007  . Acute thromboembolism of deep veins of lower extremity ([place]) 01/21/2007  . Coronary atherosclerosis 10/29/2006    Current Outpatient Medications on File Prior to Visit  Medication Sig Dispense Refill  . atorvastatin (LIPITOR) 80 MG tablet TAKE 1 TABLET(80 MG) BY MOUTH DAILY 90 tablet 1  . amLODipine (NORVASC) 2.5 MG tablet TAKE 1 TABLET(2.5 MG) BY MOUTH DAILY Follow-up  appt due in Dec must see provider for future refills 90 tablet 0  . aspirin EC 81 MG tablet Take 1 tablet (81 mg total) by mouth daily. 90 tablet 3  . benazepril (LOTENSIN) 20 MG tablet TAKE 1 AND 1/2 TABLETS(30 MG) BY MOUTH DAILY 135 tablet 1  . brimonidine (ALPHAGAN) 0.2 % ophthalmic solution Place 1 drop into both eyes 3 (three) times daily.     . dorzolamide-timolol (COSOPT) 22.3-6.8 MG/ML ophthalmic solution Place 1 drop into both eyes 2 (two) times daily.     Marland Kitchen ELIQUIS 5 MG TABS tablet TAKE 1 TABLET TWICE DAILY 180 tablet 1  . furosemide (LASIX) 20 MG tablet Take 1 tablet (20 mg total) by mouth daily. 90 tablet 3  . levothyroxine (SYNTHROID) 88 MCG tablet TAKE 1 TABLET(88 MCG) BY MOUTH DAILY 90 tablet 1  . metoprolol succinate (TOPROL-XL) 50 MG 24 hr tablet Take 1 tablet (50 mg total) by mouth daily. 90 tablet 3  . nitroGLYCERIN (NITROSTAT) 0.4 MG SL tablet Place 1 tablet (0.4 mg total) under the tongue every 5 (five) minutes as needed for chest pain. 25 tablet 1  . spironolactone (ALDACTONE) 25 MG tablet TAKE 1 TABLET(25 MG) BY MOUTH DAILY 90 tablet 1  . travoprost, benzalkonium, (TRAVATAN) 0.004 % ophthalmic solution Place 1 drop into both eyes at bedtime.     No current facility-administered medications on file prior to visit.    Past Medical History:  Diagnosis Date  . Bilateral leg edema 07/19/2015  . CAD (coronary artery disease)    a. anterior MI s/p PCI in 1992. b. cath 08/2015 with severe three-vessel  CAD turned down for CABG and underwent DESx5 to prox Cx/OM2/D1/oRCA/mRCA  . Carotid artery disease ([place])    a. carotid duplex 03/2015 showed 1-39% BICA, normal subclavian arteries, chronically occluded left vertebral, f/u recommended only PRN.  . DVT (deep venous thrombosis) ([place])    X1  . History of nuclear stress test    a. Myoview 6/17: EF 20-25%, mid anteroseptal, apical anterior, apical septal, apical inferior, apical lateral and apical scar, no ischemia, intermediate risk  .  HTN (hypertension)   . Hyperlipidemia   . Hypokalemia   . Hypothyroidism   . Ischemic cardiomyopathy    a. Echo 6/17: EF 20-25%, apex appears akinetic, MAC, moderate MR, moderate LAE, mild RVE, trivial PI, PASP 47 mmHg (needs repeat with Definity contrast).  b. Limited echo with Definity contrast 7/17: EF 25-30%, moderate to severe LAE. c. Limited Echo 2018 showed EF 40-45%.  . Lacunar infarction ([place]) 12/17/2012   Dr Krista Blue, Neurology   . Leukocytosis   . 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 Longstanding persistent atrial fibrillation ([place])   . MI (myocardial infarction) ([place]) 1992  . PAD (peripheral artery disease) (HCC)    Right SFA occlusion, severe disease left CFA and SFA  . Prediabetes 07/28/2016  . Stage 3 chronic kidney disease ([place])   . Stroke [facility])    a. 02/2017 in setting of noncompliance with Eliquis  . TIA (transient ischemic attack)   . Tricuspid regurgitation     Past Surgical History:  Procedure Laterality Date  . APPENDECTOMY     at hysterectomy and USO for fibroids, Dr. Ysidro Evert  . CARDIAC CATHETERIZATION  1992   Dr Eustace Quail  . CARDIAC CATHETERIZATION N/A 09/06/2015   Procedure: Right/Left Heart Cath and Coronary Angiography;  Surgeon: Burnell Blanks, MD;  Location: [place] [facility];  Service: Cardiovascular;  Laterality: N/A;  . CARDIAC CATHETERIZATION N/A 09/07/2015   Procedure: Coronary Stent Intervention;  Surgeon: Burnell Blanks, MD;  Location: [place] [facility];  Service: Cardiovascular;  Laterality: N/A;  . COLONOSCOPY     negative; 2008, Dr. Delfin Edis  . fracture LLE     '94; pinned  . IR ANGIO INTRA EXTRACRAN SEL COM CAROTID INNOMINATE BILAT MOD SED  03/02/2017  . IR ANGIO INTRA EXTRACRAN SEL COM CAROTID INNOMINATE BILAT MOD SED  04/23/2018  . IR ANGIO VERTEBRAL SEL SUBCLAVIAN INNOMINATE BILAT MOD SED  04/23/2018  . IR ANGIO VERTEBRAL SEL VERTEBRAL UNI R MOD SED  03/02/2017  . IR RADIOLOGIST EVAL & MGMT  04/28/2017  . IR US GUIDE VASC  ACCESS RIGHT  04/23/2018  . RADIOLOGY WITH ANESTHESIA N/A 03/02/2017   Procedure: RADIOLOGY WITH ANESTHESIA;  Surgeon: Luanne Bras, MD;  Location: [place];  Service: Radiology;  Laterality: N/A;  . TONSILLECTOMY    . TOTAL ABDOMINAL HYSTERECTOMY     & BSO for fibroids    Social History   Socioeconomic History  . Marital status: Married    Spouse name: [name]  . Number of children: 1  . Years of education: college  . Highest education level: Not on file  Occupational History  . Occupation: Pharmacist, hospital    Comment: Retired  Tobacco Use  . Smoking status: Former Smoker    Quit date: 03/11/1990    Years since quitting: 30.0  . Smokeless tobacco: Never Used  . Tobacco comment: smoked [place], up to < 5 cigarettes  Vaping Use  . Vaping Use: Never used  Substance and Sexual Activity  . Alcohol  use: No  . Drug use: No  . Sexual activity: Not Currently    Birth control/protection: Surgical  Other Topics Concern  . 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 08/31/09- designated party form signed appointing, husband Winnona Wargo; ok to leave msg on home phone [phone].      Caffeine Small Amount one cup very rare.   Right handed.   One child- [name]   Social Determinants of Health   Financial Resource Strain: Not on file  Food Insecurity: Not on file  Transportation Needs: Not on file  Physical Activity: Not on file  Stress: Not on file  Social Connections: Not on file    Family History  Problem Relation Age of Onset  . Diabetes Mother   . Hypertension Mother   . Stroke Brother        ?> 55  . Coronary artery disease Brother        stent in 67s  . Cancer Neg Hx     Review of Systems     Objective:  There were no vitals filed for this visit. There were no vitals filed for this visit. There is no height or weight on file to calculate BMI.  BP Readings from Last 3 Encounters:  12/21/19  126/74  08/20/19 124/60  08/03/19 120/72    Wt Readings from Last 3 Encounters:  12/21/19 179 lb (81.2 kg)  08/20/19 186 lb (84.4 kg)  08/03/19 188 lb 1.9 oz (85.3 kg)     Physical Exam Constitutional: She appears well-developed and well-nourished. No distress.  HENT:  Head: Normocephalic and atraumatic.  Right Ear: External ear normal. Normal ear canal and TM Left Ear: External ear normal.  Normal ear canal and TM Mouth/Throat: Oropharynx is clear and moist.  Eyes: Conjunctivae and EOM are normal.  Neck: Neck supple. No tracheal deviation present. No thyromegaly present.  No carotid bruit  Cardiovascular: Normal rate, regular rhythm and normal heart sounds.   No murmur heard.  No edema. Pulmonary/Chest: Effort normal and breath sounds normal. No respiratory distress. She has no wheezes. She has no rales.  Breast: deferred   Abdominal: Soft. She exhibits no distension. There is no tenderness.  Lymphadenopathy: She has no cervical adenopathy.  Skin: Skin is warm and dry. She is not diaphoretic.  Psychiatric: She has a normal mood and affect. Her behavior is normal.        Assessment & Plan:   Physical exam: Screening blood work    ordered Immunizations  ? Flu, ? Covid,  Discussed tdap, shingrix Colonoscopy  Na due to age 70  N/a due to age 37  [place] exams   Exercise   Weight  Ok for age Substance abuse  none      See Problem List for Assessment and Plan of chronic medical problems.      This encounter was created in error - please disregard.

## 2020-03-19 NOTE — Patient Instructions (Signed)
Blood work was ordered.     No immunization administered today.   Medications changes include :     Your prescription(s) have been submitted to your pharmacy.    A referral was ordered for      Someone will call you to schedule an appointment.    Please followup in 6 months    Health Maintenance, Female Adopting a healthy lifestyle and getting preventive care are important in promoting health and wellness. Ask your health care provider about:  The right schedule for you to have regular tests and exams.  Things you can do on your own to prevent diseases and keep yourself healthy. What should I know about diet, weight, and exercise? Eat a healthy diet   Eat a diet that includes plenty of vegetables, fruits, low-fat dairy products, and lean protein.  Do not eat a lot of foods that are high in solid fats, added sugars, or sodium. Maintain a healthy weight Body mass index (BMI) is used to identify weight problems. It estimates body fat based on height and weight. Your health care provider can help determine your BMI and help you achieve or maintain a healthy weight. Get regular exercise Get regular exercise. This is one of the most important things you can do for your health. Most adults should:  Exercise for at least 150 minutes each week. The exercise should increase your heart rate and make you sweat (moderate-intensity exercise).  Do strengthening exercises at least twice a week. This is in addition to the moderate-intensity exercise.  Spend less time sitting. Even light physical activity can be beneficial. Watch cholesterol and blood lipids Have your blood tested for lipids and cholesterol at 80 years of age, then have this test every 5 years. Have your cholesterol levels checked more often if:  Your lipid or cholesterol levels are high.  You are older than 80 years of age.  You are at high risk for heart disease. What should I know about cancer  screening? Depending on your health history and family history, you may need to have cancer screening at various ages. This may include screening for:  Breast cancer.  Cervical cancer.  Colorectal cancer.  Skin cancer.  Lung cancer. What should I know about heart disease, diabetes, and high blood pressure? Blood pressure and heart disease  High blood pressure causes heart disease and increases the risk of stroke. This is more likely to develop in people who have high blood pressure readings, are of African descent, or are overweight.  Have your blood pressure checked: ? Every 3-5 years if you are 18-39 years of age. ? Every year if you are 40 years old or older. Diabetes Have regular diabetes screenings. This checks your fasting blood sugar level. Have the screening done:  Once every three years after age 40 if you are at a normal weight and have a low risk for diabetes.  More often and at a younger age if you are overweight or have a high risk for diabetes. What should I know about preventing infection? Hepatitis B If you have a higher risk for hepatitis B, you should be screened for this virus. Talk with your health care provider to find out if you are at risk for hepatitis B infection. Hepatitis C Testing is recommended for:  Everyone born from 1945 through 1965.  Anyone with known risk factors for hepatitis C. Sexually transmitted infections (STIs)  Get screened for STIs, including gonorrhea and chlamydia, if: ? You are sexually   active and are younger than 80 years of age. ? You are older than 80 years of age and your health care provider tells you that you are at risk for this type of infection. ? Your sexual activity has changed since you were last screened, and you are at increased risk for chlamydia or gonorrhea. Ask your health care provider if you are at risk.  Ask your health care provider about whether you are at high risk for HIV. Your health care provider may  recommend a prescription medicine to help prevent HIV infection. If you choose to take medicine to prevent HIV, you should first get tested for HIV. You should then be tested every 3 months for as long as you are taking the medicine. Pregnancy  If you are about to stop having your period (premenopausal) and you may become pregnant, seek counseling before you get pregnant.  Take 400 to 800 micrograms (mcg) of folic acid every day if you become pregnant.  Ask for birth control (contraception) if you want to prevent pregnancy. Osteoporosis and menopause Osteoporosis is a disease in which the bones lose minerals and strength with aging. This can result in bone fractures. If you are 65 years old or older, or if you are at risk for osteoporosis and fractures, ask your health care provider if you should:  Be screened for bone loss.  Take a calcium or vitamin D supplement to lower your risk of fractures.  Be given hormone replacement therapy (HRT) to treat symptoms of menopause. Follow these instructions at home: Lifestyle  Do not use any products that contain nicotine or tobacco, such as cigarettes, e-cigarettes, and chewing tobacco. If you need help quitting, ask your health care provider.  Do not use street drugs.  Do not share needles.  Ask your health care provider for help if you need support or information about quitting drugs. Alcohol use  Do not drink alcohol if: ? Your health care provider tells you not to drink. ? You are pregnant, may be pregnant, or are planning to become pregnant.  If you drink alcohol: ? Limit how much you use to 0-1 drink a day. ? Limit intake if you are breastfeeding.  Be aware of how much alcohol is in your drink. In the [place] one drink equals one 12 oz bottle of beer (355 mL), one 5 oz glass of wine (148 mL), or one 1 oz glass of hard liquor (44 mL). General instructions  Schedule regular health, dental, and eye exams.  Stay current with your  vaccines.  Tell your health care provider if: ? You often feel depressed. ? You have ever been abused or do not feel safe at home. Summary  Adopting a healthy lifestyle and getting preventive care are important in promoting health and wellness.  Follow your health care provider's instructions about healthy diet, exercising, and getting tested or screened for diseases.  Follow your health care provider's instructions on monitoring your cholesterol and blood pressure. This information is not intended to replace advice given to you by your health care provider. Make sure you discuss any questions you have with your health care provider. Document Revised: 02/18/2018 Document Reviewed: 02/18/2018 Elsevier Patient Education  2020 [employer]  

## 2020-03-20 ENCOUNTER — Encounter: Payer: Medicare HMO | Admitting: Internal Medicine

## 2020-03-20 DIAGNOSIS — R7303 Prediabetes: Secondary | ICD-10-CM

## 2020-03-20 DIAGNOSIS — E785 Hyperlipidemia, unspecified: Secondary | ICD-10-CM

## 2020-03-20 DIAGNOSIS — I69320 Aphasia following cerebral infarction: Secondary | ICD-10-CM

## 2020-03-20 DIAGNOSIS — I2510 Atherosclerotic heart disease of native coronary artery without angina pectoris: Secondary | ICD-10-CM

## 2020-03-20 DIAGNOSIS — N1831 Chronic kidney disease, stage 3a: Secondary | ICD-10-CM

## 2020-03-20 DIAGNOSIS — I10 Essential (primary) hypertension: Secondary | ICD-10-CM

## 2020-03-20 DIAGNOSIS — Z0000 Encounter for general adult medical examination without abnormal findings: Secondary | ICD-10-CM

## 2020-03-20 DIAGNOSIS — E038 Other specified hypothyroidism: Secondary | ICD-10-CM

## 2020-03-21 NOTE — Patient Instructions (Signed)
Blood work was ordered.     No immunization administered today.   Medications changes include :     Your prescription(s) have been submitted to your pharmacy.    A referral was ordered for      Someone will call you to schedule an appointment.    Please followup in 6 months    Health Maintenance, Female Adopting a healthy lifestyle and getting preventive care are important in promoting health and wellness. Ask your health care provider about:  The right schedule for you to have regular tests and exams.  Things you can do on your own to prevent diseases and keep yourself healthy. What should I know about diet, weight, and exercise? Eat a healthy diet  Eat a diet that includes plenty of vegetables, fruits, low-fat dairy products, and lean protein.  Do not eat a lot of foods that are high in solid fats, added sugars, or sodium.   Maintain a healthy weight Body mass index (BMI) is used to identify weight problems. It estimates body fat based on height and weight. Your health care provider can help determine your BMI and help you achieve or maintain a healthy weight. Get regular exercise Get regular exercise. This is one of the most important things you can do for your health. Most adults should:  Exercise for at least 150 minutes each week. The exercise should increase your heart rate and make you sweat (moderate-intensity exercise).  Do strengthening exercises at least twice a week. This is in addition to the moderate-intensity exercise.  Spend less time sitting. Even light physical activity can be beneficial. Watch cholesterol and blood lipids Have your blood tested for lipids and cholesterol at 80 years of age, then have this test every 5 years. Have your cholesterol levels checked more often if:  Your lipid or cholesterol levels are high.  You are older than 80 years of age.  You are at high risk for heart disease. What should I know about cancer  screening? Depending on your health history and family history, you may need to have cancer screening at various ages. This may include screening for:  Breast cancer.  Cervical cancer.  Colorectal cancer.  Skin cancer.  Lung cancer. What should I know about heart disease, diabetes, and high blood pressure? Blood pressure and heart disease  High blood pressure causes heart disease and increases the risk of stroke. This is more likely to develop in people who have high blood pressure readings, are of African descent, or are overweight.  Have your blood pressure checked: ? Every 3-5 years if you are 18-39 years of age. ? Every year if you are 40 years old or older. Diabetes Have regular diabetes screenings. This checks your fasting blood sugar level. Have the screening done:  Once every three years after age 40 if you are at a normal weight and have a low risk for diabetes.  More often and at a younger age if you are overweight or have a high risk for diabetes. What should I know about preventing infection? Hepatitis B If you have a higher risk for hepatitis B, you should be screened for this virus. Talk with your health care provider to find out if you are at risk for hepatitis B infection. Hepatitis C Testing is recommended for:  Everyone born from 1945 through 1965.  Anyone with known risk factors for hepatitis C. Sexually transmitted infections (STIs)  Get screened for STIs, including gonorrhea and chlamydia, if: ? You are   sexually active and are younger than 80 years of age. ? You are older than 80 years of age and your health care provider tells you that you are at risk for this type of infection. ? Your sexual activity has changed since you were last screened, and you are at increased risk for chlamydia or gonorrhea. Ask your health care provider if you are at risk.  Ask your health care provider about whether you are at high risk for HIV. Your health care provider may  recommend a prescription medicine to help prevent HIV infection. If you choose to take medicine to prevent HIV, you should first get tested for HIV. You should then be tested every 3 months for as long as you are taking the medicine. Pregnancy  If you are about to stop having your period (premenopausal) and you may become pregnant, seek counseling before you get pregnant.  Take 400 to 800 micrograms (mcg) of folic acid every day if you become pregnant.  Ask for birth control (contraception) if you want to prevent pregnancy. Osteoporosis and menopause Osteoporosis is a disease in which the bones lose minerals and strength with aging. This can result in bone fractures. If you are 65 years old or older, or if you are at risk for osteoporosis and fractures, ask your health care provider if you should:  Be screened for bone loss.  Take a calcium or vitamin D supplement to lower your risk of fractures.  Be given hormone replacement therapy (HRT) to treat symptoms of menopause. Follow these instructions at home: Lifestyle  Do not use any products that contain nicotine or tobacco, such as cigarettes, e-cigarettes, and chewing tobacco. If you need help quitting, ask your health care provider.  Do not use street drugs.  Do not share needles.  Ask your health care provider for help if you need support or information about quitting drugs. Alcohol use  Do not drink alcohol if: ? Your health care provider tells you not to drink. ? You are pregnant, may be pregnant, or are planning to become pregnant.  If you drink alcohol: ? Limit how much you use to 0-1 drink a day. ? Limit intake if you are breastfeeding.  Be aware of how much alcohol is in your drink. In the [place] one drink equals one 12 oz bottle of beer (355 mL), one 5 oz glass of wine (148 mL), or one 1 oz glass of hard liquor (44 mL). General instructions  Schedule regular health, dental, and eye exams.  Stay current with your  vaccines.  Tell your health care provider if: ? You often feel depressed. ? You have ever been abused or do not feel safe at home. Summary  Adopting a healthy lifestyle and getting preventive care are important in promoting health and wellness.  Follow your health care provider's instructions about healthy diet, exercising, and getting tested or screened for diseases.  Follow your health care provider's instructions on monitoring your cholesterol and blood pressure. This information is not intended to replace advice given to you by your health care provider. Make sure you discuss any questions you have with your health care provider. Document Revised: 02/18/2018 Document Reviewed: 02/18/2018 Elsevier Patient Education  2021 Elsevier Inc.  

## 2020-03-21 NOTE — Progress Notes (Signed)
Subjective:    Patient ID: Yvonne Bailey, female    DOB: 1940/09/14, 80 y.o.   MRN: [id number]   This visit occurred during the SARS-CoV-2 public health emergency.  Safety protocols were in place, including screening questions prior to the visit, additional usage of staff PPE, and extensive cleaning of exam room while observing appropriate contact time as indicated for disinfecting solutions.    HPI She is here for a physical exam.     Medications and allergies reviewed with patient and updated if appropriate.  Patient Active Problem List   Diagnosis Date Noted  . Lack of motivation 12/21/2019  . 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 Leukocytosis   . Aphasia as late effect of cerebrovascular accident (CVA)   . Acute ischemic cerebrovascular accident (CVA) involving left middle cerebral artery [facility])   . Coronary artery disease involving native coronary artery without angina pectoris   . Stage 3 chronic kidney disease ([place])   . Prediabetes 07/28/2016  . DOE (dyspnea on exertion) 07/09/2016  . Gait instability 07/09/2016  . Cardiomyopathy, ischemic   . Chronic systolic heart failure ([place]) 08/21/2015  . Bilateral leg edema 07/19/2015  . Atrial fibrillation ([place]) 07/19/2015  . Urinary urgency 03/30/2015  . Lacunar infarction ([place]) 12/17/2012  . Small vessel disease, cerebrovascular 12/17/2012  . CAROTID BRUIT, RIGHT 08/10/2008  . Peripheral vascular disease ([place]) 06/04/2007  . Diverticulosis of large intestine 06/04/2007  . Hypothyroidism 02/24/2007  . Dyslipidemia 02/24/2007  . Essential hypertension 02/24/2007  . Acute thromboembolism of deep veins of lower extremity ([place]) 01/21/2007  . Coronary atherosclerosis 10/29/2006    Current Outpatient Medications on File Prior to Visit  Medication Sig Dispense Refill  . atorvastatin (LIPITOR) 80 MG tablet TAKE 1 TABLET(80 MG) BY MOUTH DAILY 90 tablet 1  . amLODipine (NORVASC) 2.5 MG tablet TAKE 1 TABLET(2.5  MG) BY MOUTH DAILY Follow-up appt due in Dec must see provider for future refills 90 tablet 0  . aspirin EC 81 MG tablet Take 1 tablet (81 mg total) by mouth daily. 90 tablet 3  . benazepril (LOTENSIN) 20 MG tablet TAKE 1 AND 1/2 TABLETS(30 MG) BY MOUTH DAILY 135 tablet 1  . brimonidine (ALPHAGAN) 0.2 % ophthalmic solution Place 1 drop into both eyes 3 (three) times daily.     . dorzolamide-timolol (COSOPT) 22.3-6.8 MG/ML ophthalmic solution Place 1 drop into both eyes 2 (two) times daily.     Marland Kitchen ELIQUIS 5 MG TABS tablet TAKE 1 TABLET TWICE DAILY 180 tablet 1  . furosemide (LASIX) 20 MG tablet Take 1 tablet (20 mg total) by mouth daily. 90 tablet 3  . levothyroxine (SYNTHROID) 88 MCG tablet TAKE 1 TABLET(88 MCG) BY MOUTH DAILY 90 tablet 1  . metoprolol succinate (TOPROL-XL) 50 MG 24 hr tablet Take 1 tablet (50 mg total) by mouth daily. 90 tablet 3  . nitroGLYCERIN (NITROSTAT) 0.4 MG SL tablet Place 1 tablet (0.4 mg total) under the tongue every 5 (five) minutes as needed for chest pain. 25 tablet 1  . spironolactone (ALDACTONE) 25 MG tablet TAKE 1 TABLET(25 MG) BY MOUTH DAILY 90 tablet 1  . travoprost, benzalkonium, (TRAVATAN) 0.004 % ophthalmic solution Place 1 drop into both eyes at bedtime.     No current facility-administered medications on file prior to visit.    Past Medical History:  Diagnosis Date  . Bilateral leg edema 07/19/2015  . CAD (coronary artery disease)    a. anterior MI s/p PCI in 1992. b. cath  08/2015 with severe three-vessel CAD turned down for CABG and underwent DESx5 to prox Cx/OM2/D1/oRCA/mRCA  . Carotid artery disease ([place])    a. carotid duplex 03/2015 showed 1-39% BICA, normal subclavian arteries, chronically occluded left vertebral, f/u recommended only PRN.  . DVT (deep venous thrombosis) ([place])    X1  . History of nuclear stress test    a. Myoview 6/17: EF 20-25%, mid anteroseptal, apical anterior, apical septal, apical inferior, apical lateral and apical scar, no  ischemia, intermediate risk  . HTN (hypertension)   . Hyperlipidemia   . Hypokalemia   . Hypothyroidism   . Ischemic cardiomyopathy    a. Echo 6/17: EF 20-25%, apex appears akinetic, MAC, moderate MR, moderate LAE, mild RVE, trivial PI, PASP 47 mmHg (needs repeat with Definity contrast).  b. Limited echo with Definity contrast 7/17: EF 25-30%, moderate to severe LAE. c. Limited Echo 2018 showed EF 40-45%.  . Lacunar infarction ([place]) 12/17/2012   Dr Krista Blue, Neurology   . Leukocytosis   . 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 Longstanding persistent atrial fibrillation ([place])   . MI (myocardial infarction) ([place]) 1992  . PAD (peripheral artery disease) ([facility])    Right SFA occlusion, severe disease left CFA and SFA  . Prediabetes 07/28/2016  . Stage 3 chronic kidney disease ([place])   . Stroke [facility])    a. 02/2017 in setting of noncompliance with Eliquis  . TIA (transient ischemic attack)   . Tricuspid regurgitation     Past Surgical History:  Procedure Laterality Date  . APPENDECTOMY     at hysterectomy and USO for fibroids, Dr. Ysidro Evert  . CARDIAC CATHETERIZATION  1992   Dr Eustace Quail  . CARDIAC CATHETERIZATION N/A 09/06/2015   Procedure: Right/Left Heart Cath and Coronary Angiography;  Surgeon: Burnell Blanks, MD;  Location: [place] [facility];  Service: Cardiovascular;  Laterality: N/A;  . CARDIAC CATHETERIZATION N/A 09/07/2015   Procedure: Coronary Stent Intervention;  Surgeon: Burnell Blanks, MD;  Location: [place] [facility];  Service: Cardiovascular;  Laterality: N/A;  . COLONOSCOPY     negative; 2008, Dr. Delfin Edis  . fracture LLE     '94; pinned  . IR ANGIO INTRA EXTRACRAN SEL COM CAROTID INNOMINATE BILAT MOD SED  03/02/2017  . IR ANGIO INTRA EXTRACRAN SEL COM CAROTID INNOMINATE BILAT MOD SED  04/23/2018  . IR ANGIO VERTEBRAL SEL SUBCLAVIAN INNOMINATE BILAT MOD SED  04/23/2018  . IR ANGIO VERTEBRAL SEL VERTEBRAL UNI R MOD SED  03/02/2017  . IR RADIOLOGIST EVAL & MGMT   04/28/2017  . IR US GUIDE VASC ACCESS RIGHT  04/23/2018  . RADIOLOGY WITH ANESTHESIA N/A 03/02/2017   Procedure: RADIOLOGY WITH ANESTHESIA;  Surgeon: Luanne Bras, MD;  Location: [place];  Service: Radiology;  Laterality: N/A;  . TONSILLECTOMY    . TOTAL ABDOMINAL HYSTERECTOMY     & BSO for fibroids    Social History   Socioeconomic History  . Marital status: Married    Spouse name: [name]  . Number of children: 1  . Years of education: college  . Highest education level: Not on file  Occupational History  . Occupation: Pharmacist, hospital    Comment: Retired  Tobacco Use  . Smoking status: Former Smoker    Quit date: 03/11/1990    Years since quitting: 30.0  . Smokeless tobacco: Never Used  . Tobacco comment: smoked [facility], up to < 5 cigarettes  Vaping Use  . Vaping Use: Never used  Substance and Sexual  Activity  . Alcohol use: No  . Drug use: No  . Sexual activity: Not Currently    Birth control/protection: Surgical  Other Topics Concern  . 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 08/31/09- designated party form signed appointing, husband Atianna Haidar; ok to leave msg on home phone [phone].      Caffeine Small Amount one cup very rare.   Right handed.   One child- [name]   Social Determinants of Health   Financial Resource Strain: Not on file  Food Insecurity: Not on file  Transportation Needs: Not on file  Physical Activity: Not on file  Stress: Not on file  Social Connections: Not on file    Family History  Problem Relation Age of Onset  . Diabetes Mother   . Hypertension Mother   . Stroke Brother        ?> 55  . Coronary artery disease Brother        stent in 56s  . Cancer Neg Hx     Review of Systems     Objective:  There were no vitals filed for this visit. There were no vitals filed for this visit. There is no height or weight on file to calculate BMI.  BP Readings  from Last 3 Encounters:  12/21/19 126/74  08/20/19 124/60  08/03/19 120/72    Wt Readings from Last 3 Encounters:  12/21/19 179 lb (81.2 kg)  08/20/19 186 lb (84.4 kg)  08/03/19 188 lb 1.9 oz (85.3 kg)     Physical Exam Constitutional: She appears well-developed and well-nourished. No distress.  HENT:  Head: Normocephalic and atraumatic.  Right Ear: External ear normal. Normal ear canal and TM Left Ear: External ear normal.  Normal ear canal and TM Mouth/Throat: Oropharynx is clear and moist.  Eyes: Conjunctivae and EOM are normal.  Neck: Neck supple. No tracheal deviation present. No thyromegaly present.  No carotid bruit  Cardiovascular: Normal rate, regular rhythm and normal heart sounds.   No murmur heard.  No edema. Pulmonary/Chest: Effort normal and breath sounds normal. No respiratory distress. She has no wheezes. She has no rales.  Breast: deferred   Abdominal: Soft. She exhibits no distension. There is no tenderness.  Lymphadenopathy: She has no cervical adenopathy.  Skin: Skin is warm and dry. She is not diaphoretic.  Psychiatric: She has a normal mood and affect. Her behavior is normal.        Assessment & Plan:   Physical exam: Screening blood work    ordered Immunizations  ? Flu, ? Covid,  Discussed tdap, shingrix Colonoscopy  Na due to age 18  N/a due to age 65  [place] exams   Exercise   Weight  Ok for age Substance abuse  none      See Problem List for Assessment and Plan of chronic medical problems.      This encounter was created in error - please disregard.

## 2020-03-22 ENCOUNTER — Encounter: Payer: Medicare HMO | Admitting: Internal Medicine

## 2020-03-22 DIAGNOSIS — I10 Essential (primary) hypertension: Secondary | ICD-10-CM

## 2020-03-22 DIAGNOSIS — E038 Other specified hypothyroidism: Secondary | ICD-10-CM

## 2020-03-22 DIAGNOSIS — R7303 Prediabetes: Secondary | ICD-10-CM

## 2020-03-22 DIAGNOSIS — I4811 Longstanding persistent atrial fibrillation: Secondary | ICD-10-CM

## 2020-03-22 DIAGNOSIS — N1831 Chronic kidney disease, stage 3a: Secondary | ICD-10-CM

## 2020-03-22 DIAGNOSIS — I69320 Aphasia following cerebral infarction: Secondary | ICD-10-CM

## 2020-03-22 DIAGNOSIS — I2510 Atherosclerotic heart disease of native coronary artery without angina pectoris: Secondary | ICD-10-CM

## 2020-03-22 DIAGNOSIS — Z0000 Encounter for general adult medical examination without abnormal findings: Secondary | ICD-10-CM

## 2020-03-22 DIAGNOSIS — E785 Hyperlipidemia, unspecified: Secondary | ICD-10-CM

## 2020-03-22 DIAGNOSIS — R600 Localized edema: Secondary | ICD-10-CM

## 2020-03-22 DIAGNOSIS — Z0289 Encounter for other administrative examinations: Secondary | ICD-10-CM

## 2020-03-23 ENCOUNTER — Other Ambulatory Visit: Payer: Self-pay | Admitting: Internal Medicine

## 2020-03-23 ENCOUNTER — Ambulatory Visit: Payer: Medicare HMO

## 2020-03-26 NOTE — Progress Notes (Unsigned)
Subjective:    Patient ID: Yvonne Bailey, female    DOB: 10/15/1940, 80 y.o.   MRN: [id number]   This visit occurred during the SARS-CoV-2 public health emergency.  Safety protocols were in place, including screening questions prior to the visit, additional usage of staff PPE, and extensive cleaning of exam room while observing appropriate contact time as indicated for disinfecting solutions.    HPI She is here for a physical exam.   She wakes at 3-4 am and often wakes up and will get dressed to go to the center.  She can not go back to sleep.  Her son wondered if a sleep medication would help.      She has frequent urination.  Her son feels she may be anxious and depressed at times.     Medications and allergies reviewed with patient and updated if appropriate.  Patient Active Problem List   Diagnosis Date Noted  . Lack of motivation 12/21/2019  . 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 Leukocytosis   . Aphasia as late effect of cerebrovascular accident (CVA)   . Acute ischemic cerebrovascular accident (CVA) involving left middle cerebral artery [facility])   . Coronary artery disease involving native coronary artery without angina pectoris   . Stage 3 chronic kidney disease ([place])   . Prediabetes 07/28/2016  . DOE (dyspnea on exertion) 07/09/2016  . Gait instability 07/09/2016  . Cardiomyopathy, ischemic   . Chronic systolic heart failure ([place]) 08/21/2015  . Bilateral leg edema 07/19/2015  . Atrial fibrillation ([place]) 07/19/2015  . Urinary urgency 03/30/2015  . Lacunar infarction ([place]) 12/17/2012  . Small vessel disease, cerebrovascular 12/17/2012  . CAROTID BRUIT, RIGHT 08/10/2008  . Peripheral vascular disease ([place]) 06/04/2007  . Diverticulosis of large intestine 06/04/2007  . Hypothyroidism 02/24/2007  . Dyslipidemia 02/24/2007  . Essential hypertension 02/24/2007  . Acute thromboembolism of deep veins of lower extremity ([place]) 01/21/2007  . Coronary  atherosclerosis 10/29/2006    Current Outpatient Medications on File Prior to Visit  Medication Sig Dispense Refill  . amLODipine (NORVASC) 2.5 MG tablet TAKE 1 TABLET(2.5 MG) BY MOUTH DAILY Follow-up appt due in Dec must see provider for future refills 90 tablet 0  . aspirin EC 81 MG tablet Take 1 tablet (81 mg total) by mouth daily. 90 tablet 3  . atorvastatin (LIPITOR) 80 MG tablet TAKE 1 TABLET(80 MG) BY MOUTH DAILY 90 tablet 1  . benazepril (LOTENSIN) 20 MG tablet TAKE 1 AND 1/2 TABLETS(30 MG) BY MOUTH DAILY 135 tablet 1  . brimonidine (ALPHAGAN) 0.2 % ophthalmic solution Place 1 drop into both eyes 3 (three) times daily.     . dorzolamide-timolol (COSOPT) 22.3-6.8 MG/ML ophthalmic solution Place 1 drop into both eyes 2 (two) times daily.     Marland Kitchen ELIQUIS 5 MG TABS tablet TAKE 1 TABLET TWICE DAILY 180 tablet 1  . furosemide (LASIX) 20 MG tablet Take 1 tablet (20 mg total) by mouth daily. 90 tablet 3  . levothyroxine (SYNTHROID) 88 MCG tablet TAKE 1 TABLET(88 MCG) BY MOUTH DAILY 90 tablet 1  . metoprolol succinate (TOPROL-XL) 50 MG 24 hr tablet Take 1 tablet (50 mg total) by mouth daily. 90 tablet 3  . nitroGLYCERIN (NITROSTAT) 0.4 MG SL tablet Place 1 tablet (0.4 mg total) under the tongue every 5 (five) minutes as needed for chest pain. 25 tablet 1  . spironolactone (ALDACTONE) 25 MG tablet TAKE 1 TABLET(25 MG) BY MOUTH DAILY 90 tablet 1  . travoprost, benzalkonium, (  TRAVATAN) 0.004 % ophthalmic solution Place 1 drop into both eyes at bedtime.     No current facility-administered medications on file prior to visit.    Past Medical History:  Diagnosis Date  . Bilateral leg edema 07/19/2015  . CAD (coronary artery disease)    a. anterior MI s/p PCI in 1992. b. cath 08/2015 with severe three-vessel CAD turned down for CABG and underwent DESx5 to prox Cx/OM2/D1/oRCA/mRCA  . Carotid artery disease ([place])    a. carotid duplex 03/2015 showed 1-39% BICA, normal subclavian arteries, chronically  occluded left vertebral, f/u recommended only PRN.  . DVT (deep venous thrombosis) ([place])    X1  . History of nuclear stress test    a. Myoview 6/17: EF 20-25%, mid anteroseptal, apical anterior, apical septal, apical inferior, apical lateral and apical scar, no ischemia, intermediate risk  . HTN (hypertension)   . Hyperlipidemia   . Hypokalemia   . Hypothyroidism   . Ischemic cardiomyopathy    a. Echo 6/17: EF 20-25%, apex appears akinetic, MAC, moderate MR, moderate LAE, mild RVE, trivial PI, PASP 47 mmHg (needs repeat with Definity contrast).  b. Limited echo with Definity contrast 7/17: EF 25-30%, moderate to severe LAE. c. Limited Echo 2018 showed EF 40-45%.  . Lacunar infarction ([place]) 12/17/2012   Dr Krista Blue, Neurology   . Leukocytosis   . 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 Longstanding persistent atrial fibrillation ([place])   . MI (myocardial infarction) ([place]) 1992  . PAD (peripheral artery disease) ([facility])    Right SFA occlusion, severe disease left CFA and SFA  . Prediabetes 07/28/2016  . Stage 3 chronic kidney disease ([place])   . Stroke [facility])    a. 02/2017 in setting of noncompliance with Eliquis  . TIA (transient ischemic attack)   . Tricuspid regurgitation     Past Surgical History:  Procedure Laterality Date  . APPENDECTOMY     at hysterectomy and USO for fibroids, Dr. Ysidro Evert  . CARDIAC CATHETERIZATION  1992   Dr Eustace Quail  . CARDIAC CATHETERIZATION N/A 09/06/2015   Procedure: Right/Left Heart Cath and Coronary Angiography;  Surgeon: Burnell Blanks, MD;  Location: [facility];  Service: Cardiovascular;  Laterality: N/A;  . CARDIAC CATHETERIZATION N/A 09/07/2015   Procedure: Coronary Stent Intervention;  Surgeon: Burnell Blanks, MD;  Location: [facility];  Service: Cardiovascular;  Laterality: N/A;  . COLONOSCOPY     negative; 2008, Dr. Delfin Edis  . fracture LLE     '94; pinned  . IR ANGIO INTRA EXTRACRAN SEL COM CAROTID INNOMINATE BILAT  MOD SED  03/02/2017  . IR ANGIO INTRA EXTRACRAN SEL COM CAROTID INNOMINATE BILAT MOD SED  04/23/2018  . IR ANGIO VERTEBRAL SEL SUBCLAVIAN INNOMINATE BILAT MOD SED  04/23/2018  . IR ANGIO VERTEBRAL SEL VERTEBRAL UNI R MOD SED  03/02/2017  . IR RADIOLOGIST EVAL & MGMT  04/28/2017  . IR US GUIDE VASC ACCESS RIGHT  04/23/2018  . RADIOLOGY WITH ANESTHESIA N/A 03/02/2017   Procedure: RADIOLOGY WITH ANESTHESIA;  Surgeon: Luanne Bras, MD;  Location: [facility];  Service: Radiology;  Laterality: N/A;  . TONSILLECTOMY    . TOTAL ABDOMINAL HYSTERECTOMY     & BSO for fibroids    Social History   Socioeconomic History  . Marital status: Married    Spouse name: [name]  . Number of children: 1  . Years of education: college  . Highest education level: Not on file  Occupational History  . Occupation:  Teacher    Comment: Retired  Tobacco Use  . Smoking status: Former Smoker    Quit date: 03/11/1990    Years since quitting: 30.0  . Smokeless tobacco: Never Used  . Tobacco comment: smoked [place], up to < 5 cigarettes  Vaping Use  . Vaping Use: Never used  Substance and Sexual Activity  . Alcohol use: No  . Drug use: No  . Sexual activity: Not Currently    Birth control/protection: Surgical  Other Topics Concern  . 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 08/31/09- designated party form signed appointing, husband Osa Fogarty; ok to leave msg on home phone [phone].      Caffeine Small Amount one cup very rare.   Right handed.   One child- [name]   Social Determinants of Health   Financial Resource Strain: Not on file  Food Insecurity: Not on file  Transportation Needs: Not on file  Physical Activity: Not on file  Stress: Not on file  Social Connections: Not on file    Family History  Problem Relation Age of Onset  . Diabetes Mother   . Hypertension Mother   . Stroke Brother        ?> 55  .  Coronary artery disease Brother        stent in 46s  . Cancer Neg Hx     Review of Systems  Constitutional: Negative for chills and fever.  Eyes: Negative for visual disturbance.  Respiratory: Negative for cough, shortness of breath and wheezing.   Cardiovascular: Positive for leg swelling. Negative for chest pain and palpitations.  Gastrointestinal: Negative for abdominal pain, blood in stool, constipation, diarrhea and nausea.  Genitourinary: Negative for dysuria.  Musculoskeletal: Negative for arthralgias and back pain.  Skin: Negative for rash.  Neurological: Negative for light-headedness and headaches.  Psychiatric/Behavioral: Positive for dysphoric mood. The patient is nervous/anxious.        Objective:   Vitals:   03/28/20 1445  BP: 128/70  Pulse: 69  Resp: 16  Temp: 98.3 F (36.8 C)  SpO2: 97%   Filed Weights   03/28/20 1445  Weight: 176 lb (79.8 kg)   Body mass index is 28.41 kg/m.  BP Readings from Last 3 Encounters:  03/28/20 128/70  12/21/19 126/74  08/20/19 124/60    Wt Readings from Last 3 Encounters:  03/28/20 176 lb (79.8 kg)  12/21/19 179 lb (81.2 kg)  08/20/19 186 lb (84.4 kg)     Physical Exam Constitutional: She appears well-developed and well-nourished. No distress. Sitting in wheelchair HENT:  Head: Normocephalic and atraumatic.  Right Ear: External ear normal. Normal ear canal and TM Left Ear: External ear normal.  Normal ear canal and TM Mouth/Throat: Oropharynx is clear and moist.  Eyes: Conjunctivae and EOM are normal.  Neck: Neck supple. No tracheal deviation present. No thyromegaly present.  No carotid bruit  Cardiovascular: Normal rate, regular rhythm and normal heart sounds.   No murmur heard.  Mild b/l LE edema. Pulmonary/Chest: Effort normal and breath sounds normal. No respiratory distress. She has no wheezes. She has no rales.  Breast: deferred   Abdominal: Soft. She exhibits no distension. There is no tenderness.   Lymphadenopathy: She has no cervical adenopathy.  Skin: Skin is warm and dry. She is not diaphoretic. Right first toe with black and blue on distal toe - tender to touch. Nail deformity.  No open wound.  B/l first toe callus formation.   Psychiatric: She has a normal mood and affect. Her behavior is normal.        Assessment & Plan:   Physical exam: Screening blood work    ordered Immunizations  deferred Flu, had 2 Covid vaccines - stressed booster,  Discussed tdap, shingrix Colonoscopy  Na due to age 52  N/a due to age 38  na Dexa  ordered Eye exams  Not up to date Exercise  A little at the center Weight  Ok for age Substance abuse  none      See Problem List for Assessment and Plan of chronic medical problems.

## 2020-03-28 ENCOUNTER — Other Ambulatory Visit: Payer: Self-pay

## 2020-03-28 ENCOUNTER — Ambulatory Visit (INDEPENDENT_AMBULATORY_CARE_PROVIDER_SITE_OTHER): Payer: Medicare HMO | Admitting: Internal Medicine

## 2020-03-28 ENCOUNTER — Encounter: Payer: Self-pay | Admitting: Internal Medicine

## 2020-03-28 VITALS — BP 128/70 | HR 69 | Temp 98.3°F | Resp 16 | Ht 66.0 in | Wt 176.0 lb

## 2020-03-28 DIAGNOSIS — E038 Other specified hypothyroidism: Secondary | ICD-10-CM | POA: Diagnosis not present

## 2020-03-28 DIAGNOSIS — R600 Localized edema: Secondary | ICD-10-CM | POA: Diagnosis not present

## 2020-03-28 DIAGNOSIS — F32A Depression, unspecified: Secondary | ICD-10-CM

## 2020-03-28 DIAGNOSIS — Z111 Encounter for screening for respiratory tuberculosis: Secondary | ICD-10-CM

## 2020-03-28 DIAGNOSIS — E785 Hyperlipidemia, unspecified: Secondary | ICD-10-CM | POA: Diagnosis not present

## 2020-03-28 DIAGNOSIS — I4811 Longstanding persistent atrial fibrillation: Secondary | ICD-10-CM

## 2020-03-28 DIAGNOSIS — N1831 Chronic kidney disease, stage 3a: Secondary | ICD-10-CM | POA: Diagnosis not present

## 2020-03-28 DIAGNOSIS — I10 Essential (primary) hypertension: Secondary | ICD-10-CM

## 2020-03-28 DIAGNOSIS — Z0000 Encounter for general adult medical examination without abnormal findings: Secondary | ICD-10-CM

## 2020-03-28 DIAGNOSIS — R7303 Prediabetes: Secondary | ICD-10-CM

## 2020-03-28 DIAGNOSIS — I2510 Atherosclerotic heart disease of native coronary artery without angina pectoris: Secondary | ICD-10-CM

## 2020-03-28 DIAGNOSIS — N3281 Overactive bladder: Secondary | ICD-10-CM

## 2020-03-28 DIAGNOSIS — I69320 Aphasia following cerebral infarction: Secondary | ICD-10-CM

## 2020-03-28 DIAGNOSIS — F419 Anxiety disorder, unspecified: Secondary | ICD-10-CM

## 2020-03-28 DIAGNOSIS — Z13820 Encounter for screening for osteoporosis: Secondary | ICD-10-CM

## 2020-03-28 LAB — CBC WITH DIFFERENTIAL/PLATELET
Basophils Absolute: 0.1 10*3/uL (ref 0.0–0.1)
Basophils Relative: 0.9 % (ref 0.0–3.0)
Eosinophils Absolute: 0.2 10*3/uL (ref 0.0–0.7)
Eosinophils Relative: 1.7 % (ref 0.0–5.0)
HCT: 38.8 % (ref 36.0–46.0)
Hemoglobin: 12.9 g/dL (ref 12.0–15.0)
Lymphocytes Relative: 26.6 % (ref 12.0–46.0)
Lymphs Abs: 2.5 10*3/uL (ref 0.7–4.0)
MCHC: 33.2 g/dL (ref 30.0–36.0)
MCV: 97 fl (ref 78.0–100.0)
Monocytes Absolute: 0.8 10*3/uL (ref 0.1–1.0)
Monocytes Relative: 8.4 % (ref 3.0–12.0)
Neutro Abs: 5.8 10*3/uL (ref 1.4–7.7)
Neutrophils Relative %: 62.4 % (ref 43.0–77.0)
Platelets: 268 10*3/uL (ref 150.0–400.0)
RBC: 4 Mil/uL (ref 3.87–5.11)
RDW: 13.1 % (ref 11.5–15.5)
WBC: 9.3 10*3/uL (ref 4.0–10.5)

## 2020-03-28 LAB — LIPID PANEL
Cholesterol: 117 mg/dL (ref 0–200)
HDL: 39.3 mg/dL (ref >39.00)
LDL Cholesterol: 65 mg/dL (ref 0–99)
NonHDL: 77.36
Total CHOL/HDL Ratio: 3
Triglycerides: 60 mg/dL (ref 0.0–149.0)
VLDL: 12 mg/dL (ref 0.0–40.0)

## 2020-03-28 LAB — HEMOGLOBIN A1C: Hgb A1c MFr Bld: 5.4 % (ref 4.6–6.5)

## 2020-03-28 LAB — COMPREHENSIVE METABOLIC PANEL
ALT: 14 U/L (ref 0–35)
AST: 20 U/L (ref 0–37)
Albumin: 4.3 g/dL (ref 3.5–5.2)
Alkaline Phosphatase: 107 U/L (ref 39–117)
BUN: 23 mg/dL (ref 6–23)
CO2: 28 mEq/L (ref 19–32)
Calcium: 9.5 mg/dL (ref 8.4–10.5)
Chloride: 105 mEq/L (ref 96–112)
Creatinine, Ser: 1.09 mg/dL (ref 0.40–1.20)
GFR: 48.25 mL/min — ABNORMAL LOW (ref >60.00)
Glucose, Bld: 98 mg/dL (ref 70–99)
Potassium: 4.1 mEq/L (ref 3.5–5.1)
Sodium: 140 mEq/L (ref 135–145)
Total Bilirubin: 0.8 mg/dL (ref 0.2–1.2)
Total Protein: 7.3 g/dL (ref 6.0–8.3)

## 2020-03-28 LAB — TSH: TSH: 0.86 u[IU]/mL (ref 0.35–4.50)

## 2020-03-28 MED ORDER — MIRABEGRON ER 25 MG PO TB24: 25.0000 mg | ORAL_TABLET | Freq: Every day | ORAL | 5 refills | Status: DC

## 2020-03-28 MED ORDER — SERTRALINE HCL 25 MG PO TABS: 25.0000 mg | ORAL_TABLET | Freq: Every day | ORAL | 5 refills | Status: DC

## 2020-03-28 NOTE — Assessment & Plan Note (Signed)
New problem Her son feels she is depressed and anxious and she admits she probably is Start sertraline 25 mg daily - take in evening

## 2020-03-28 NOTE — Assessment & Plan Note (Signed)
Chronic BP well controlled Continue amlodipine 2.5 mg daily, benazepril 30 mg daily, lasix 20 mg daily, metoprolol xl 50 mg daily, spironolactone 25 mg daily cmp

## 2020-03-28 NOTE — Assessment & Plan Note (Signed)
Chronic cmp 

## 2020-03-28 NOTE — Assessment & Plan Note (Signed)
Chronic No concerning symptoms of angina Continue current medications

## 2020-03-28 NOTE — Assessment & Plan Note (Signed)
Chronic Sees cardiology Continue eliquis 5 mg BID, metoprolol xl 50 mg daily Cbc, cmp

## 2020-03-28 NOTE — Assessment & Plan Note (Signed)
Chronic Has seen urology in the past and was on medication Will try myrbetriq 25 mg daily Follow up in 3 months

## 2020-03-28 NOTE — Assessment & Plan Note (Signed)
Chronic Controlled, stable Continue lasix 20 mg daily  

## 2020-03-28 NOTE — Assessment & Plan Note (Signed)
Chronic  Clinically euthyroid Currently taking levothyroxine 88 mcg daily Check tsh  Titrate med dose if needed  

## 2020-03-28 NOTE — Assessment & Plan Note (Signed)
Chronic Stressed regular exercise Continue atorvastatin 80 mg daily, eliquis 5 mg twice daily BP well controlled Cbc, cmp, lipids

## 2020-03-28 NOTE — Assessment & Plan Note (Signed)
Chronic Check lipid panel  Continue atorvastatin 80 mg daily Regular exercise and healthy diet encouraged  

## 2020-03-28 NOTE — Patient Instructions (Addendum)
Blood work was ordered.     No immunization administered today.   Medications changes include :   Sertraline 25 mg in evening and myrbetriq 25 mg daily for your bladder.    Your prescription(s) have been submitted to your pharmacy.     Please followup in 3 months    Health Maintenance, Female Adopting a healthy lifestyle and getting preventive care are important in promoting health and wellness. Ask your health care provider about:  The right schedule for you to have regular tests and exams.  Things you can do on your own to prevent diseases and keep yourself healthy. What should I know about diet, weight, and exercise? Eat a healthy diet  Eat a diet that includes plenty of vegetables, fruits, low-fat dairy products, and lean protein.  Do not eat a lot of foods that are high in solid fats, added sugars, or sodium.   Maintain a healthy weight Body mass index (BMI) is used to identify weight problems. It estimates body fat based on height and weight. Your health care provider can help determine your BMI and help you achieve or maintain a healthy weight. Get regular exercise Get regular exercise. This is one of the most important things you can do for your health. Most adults should:  Exercise for at least 150 minutes each week. The exercise should increase your heart rate and make you sweat (moderate-intensity exercise).  Do strengthening exercises at least twice a week. This is in addition to the moderate-intensity exercise.  Spend less time sitting. Even light physical activity can be beneficial. Watch cholesterol and blood lipids Have your blood tested for lipids and cholesterol at 80 years of age, then have this test every 5 years. Have your cholesterol levels checked more often if:  Your lipid or cholesterol levels are high.  You are older than 80 years of age.  You are at high risk for heart disease. What should I know about cancer screening? Depending on your  health history and family history, you may need to have cancer screening at various ages. This may include screening for:  Breast cancer.  Cervical cancer.  Colorectal cancer.  Skin cancer.  Lung cancer. What should I know about heart disease, diabetes, and high blood pressure? Blood pressure and heart disease  High blood pressure causes heart disease and increases the risk of stroke. This is more likely to develop in people who have high blood pressure readings, are of African descent, or are overweight.  Have your blood pressure checked: ? Every 3-5 years if you are 73-55 years of age. ? Every year if you are 76 years old or older. Diabetes Have regular diabetes screenings. This checks your fasting blood sugar level. Have the screening done:  Once every three years after age 80 if you are at a normal weight and have a low risk for diabetes.  More often and at a younger age if you are overweight or have a high risk for diabetes. What should I know about preventing infection? Hepatitis B If you have a higher risk for hepatitis B, you should be screened for this virus. Talk with your health care provider to find out if you are at risk for hepatitis B infection. Hepatitis C Testing is recommended for:  Everyone born from [birth year] through 1965.  Anyone with known risk factors for hepatitis C. Sexually transmitted infections (STIs)  Get screened for STIs, including gonorrhea and chlamydia, if: ? You are sexually active and are younger than  80 years of age. ? You are older than 80 years of age and your health care provider tells you that you are at risk for this type of infection. ? Your sexual activity has changed since you were last screened, and you are at increased risk for chlamydia or gonorrhea. Ask your health care provider if you are at risk.  Ask your health care provider about whether you are at high risk for HIV. Your health care provider may recommend a prescription  medicine to help prevent HIV infection. If you choose to take medicine to prevent HIV, you should first get tested for HIV. You should then be tested every 3 months for as long as you are taking the medicine. Pregnancy  If you are about to stop having your period (premenopausal) and you may become pregnant, seek counseling before you get pregnant.  Take 400 to 800 micrograms (mcg) of folic acid every day if you become pregnant.  Ask for birth control (contraception) if you want to prevent pregnancy. Osteoporosis and menopause Osteoporosis is a disease in which the bones lose minerals and strength with aging. This can result in bone fractures. If you are 36 years old or older, or if you are at risk for osteoporosis and fractures, ask your health care provider if you should:  Be screened for bone loss.  Take a calcium or vitamin D supplement to lower your risk of fractures.  Be given hormone replacement therapy (HRT) to treat symptoms of menopause. Follow these instructions at home: Lifestyle  Do not use any products that contain nicotine or tobacco, such as cigarettes, e-cigarettes, and chewing tobacco. If you need help quitting, ask your health care provider.  Do not use street drugs.  Do not share needles.  Ask your health care provider for help if you need support or information about quitting drugs. Alcohol use  Do not drink alcohol if: ? Your health care provider tells you not to drink. ? You are pregnant, may be pregnant, or are planning to become pregnant.  If you drink alcohol: ? Limit how much you use to 0-1 drink a day. ? Limit intake if you are breastfeeding.  Be aware of how much alcohol is in your drink. In the [place] one drink equals one 12 oz bottle of beer (355 mL), one 5 oz glass of wine (148 mL), or one 1 oz glass of hard liquor (44 mL). General instructions  Schedule regular health, dental, and eye exams.  Stay current with your vaccines.  Tell your health  care provider if: ? You often feel depressed. ? You have ever been abused or do not feel safe at home. Summary  Adopting a healthy lifestyle and getting preventive care are important in promoting health and wellness.  Follow your health care provider's instructions about healthy diet, exercising, and getting tested or screened for diseases.  Follow your health care provider's instructions on monitoring your cholesterol and blood pressure. This information is not intended to replace advice given to you by your health care provider. Make sure you discuss any questions you have with your health care provider. Document Revised: 02/18/2018 Document Reviewed: 02/18/2018 Elsevier Patient Education  2021 [employer].

## 2020-03-28 NOTE — Assessment & Plan Note (Signed)
Chronic Check a1c Low sugar / carb diet Stressed regular exercise  

## 2020-03-30 LAB — TB SKIN TEST
Induration: 0 mm
TB Skin Test: NEGATIVE

## 2020-03-31 ENCOUNTER — Ambulatory Visit: Payer: Medicare HMO

## 2020-04-03 ENCOUNTER — Telehealth: Payer: Self-pay | Admitting: Internal Medicine

## 2020-04-03 ENCOUNTER — Encounter: Payer: Self-pay | Admitting: Internal Medicine

## 2020-04-03 NOTE — Telephone Encounter (Signed)
Ptients son called and was wondering if the paper work that was dropped off on 03/28/20 was completed and ready to be picked up or fax. Please give him a call back at [phone].

## 2020-04-03 NOTE — Telephone Encounter (Signed)
Spoke with son today °

## 2020-05-04 ENCOUNTER — Telehealth: Payer: Self-pay | Admitting: Internal Medicine

## 2020-05-04 MED ORDER — SERTRALINE HCL 50 MG PO TABS: 50.0000 mg | ORAL_TABLET | Freq: Every day | ORAL | 5 refills | Status: DC

## 2020-05-04 NOTE — Telephone Encounter (Signed)
Patients son called and said that she has been getting up again at night and was wondering if the dosage forsertraline (ZOLOFT) 25 MG tablet could be increased. Please advise. He can be reached at [phone]

## 2020-05-04 NOTE — Telephone Encounter (Signed)
Yes increase to 50 mg - she can take 2 tabs at night of what she has - will send new rx for 50 mg to pharmacy

## 2020-05-04 NOTE — Telephone Encounter (Signed)
Spoke with patient's son today. 

## 2020-05-08 ENCOUNTER — Other Ambulatory Visit: Payer: Self-pay | Admitting: Internal Medicine

## 2020-06-07 ENCOUNTER — Emergency Department (HOSPITAL_COMMUNITY): Payer: Medicare HMO

## 2020-06-07 ENCOUNTER — Encounter (HOSPITAL_COMMUNITY): Payer: Self-pay | Admitting: Emergency Medicine

## 2020-06-07 ENCOUNTER — Other Ambulatory Visit: Payer: Self-pay

## 2020-06-07 ENCOUNTER — Emergency Department (HOSPITAL_COMMUNITY)
Admission: EM | Admit: 2020-06-07 | Discharge: 2020-06-08 | Disposition: A | Discharge status: Home / Self Care | Payer: Medicare HMO | Attending: Emergency Medicine | Admitting: Emergency Medicine

## 2020-06-07 DIAGNOSIS — I5022 Chronic systolic (congestive) heart failure: Secondary | ICD-10-CM | POA: Insufficient documentation

## 2020-06-07 DIAGNOSIS — R4781 Slurred speech: Secondary | ICD-10-CM | POA: Diagnosis not present

## 2020-06-07 DIAGNOSIS — Z7901 Long term (current) use of anticoagulants: Secondary | ICD-10-CM | POA: Diagnosis not present

## 2020-06-07 DIAGNOSIS — Z7982 Long term (current) use of aspirin: Secondary | ICD-10-CM | POA: Insufficient documentation

## 2020-06-07 DIAGNOSIS — R531 Weakness: Secondary | ICD-10-CM | POA: Diagnosis not present

## 2020-06-07 DIAGNOSIS — Z79899 Other long term (current) drug therapy: Secondary | ICD-10-CM | POA: Insufficient documentation

## 2020-06-07 DIAGNOSIS — R410 Disorientation, unspecified: Secondary | ICD-10-CM | POA: Insufficient documentation

## 2020-06-07 DIAGNOSIS — I130 Hypertensive heart and chronic kidney disease with heart failure and stage 1 through stage 4 chronic kidney disease, or unspecified chronic kidney disease: Secondary | ICD-10-CM | POA: Diagnosis not present

## 2020-06-07 DIAGNOSIS — R479 Unspecified speech disturbances: Secondary | ICD-10-CM | POA: Insufficient documentation

## 2020-06-07 DIAGNOSIS — E039 Hypothyroidism, unspecified: Secondary | ICD-10-CM | POA: Insufficient documentation

## 2020-06-07 DIAGNOSIS — N183 Chronic kidney disease, stage 3 unspecified: Secondary | ICD-10-CM | POA: Diagnosis not present

## 2020-06-07 DIAGNOSIS — I10 Essential (primary) hypertension: Secondary | ICD-10-CM | POA: Diagnosis not present

## 2020-06-07 DIAGNOSIS — R29898 Other symptoms and signs involving the musculoskeletal system: Secondary | ICD-10-CM | POA: Diagnosis not present

## 2020-06-07 DIAGNOSIS — I2510 Atherosclerotic heart disease of native coronary artery without angina pectoris: Secondary | ICD-10-CM | POA: Insufficient documentation

## 2020-06-07 DIAGNOSIS — G459 Transient cerebral ischemic attack, unspecified: Secondary | ICD-10-CM | POA: Insufficient documentation

## 2020-06-07 DIAGNOSIS — R29810 Facial weakness: Secondary | ICD-10-CM | POA: Diagnosis not present

## 2020-06-07 DIAGNOSIS — Z87891 Personal history of nicotine dependence: Secondary | ICD-10-CM | POA: Insufficient documentation

## 2020-06-07 DIAGNOSIS — R0902 Hypoxemia: Secondary | ICD-10-CM | POA: Diagnosis not present

## 2020-06-07 DIAGNOSIS — R4702 Dysphasia: Secondary | ICD-10-CM | POA: Diagnosis not present

## 2020-06-07 LAB — CBC
HCT: 37.3 % (ref 36.0–46.0)
Hemoglobin: 12.1 g/dL (ref 12.0–15.0)
MCH: 33.1 pg (ref 26.0–34.0)
MCHC: 32.4 g/dL (ref 30.0–36.0)
MCV: 101.9 fL — ABNORMAL HIGH (ref 80.0–100.0)
Platelets: 231 10*3/uL (ref 150–400)
RBC: 3.66 MIL/uL — ABNORMAL LOW (ref 3.87–5.11)
RDW: 13.2 % (ref 11.5–15.5)
WBC: 9.9 10*3/uL (ref 4.0–10.5)
nRBC: 0 % (ref 0.0–0.2)

## 2020-06-07 LAB — COMPREHENSIVE METABOLIC PANEL
ALT: 16 U/L (ref 0–44)
AST: 18 U/L (ref 15–41)
Albumin: 3.8 g/dL (ref 3.5–5.0)
Alkaline Phosphatase: 90 U/L (ref 38–126)
Anion gap: 7 (ref 5–15)
BUN: 27 mg/dL — ABNORMAL HIGH (ref 8–23)
CO2: 26 mmol/L (ref 22–32)
Calcium: 9.2 mg/dL (ref 8.9–10.3)
Chloride: 106 mmol/L (ref 98–111)
Creatinine, Ser: 1.31 mg/dL — ABNORMAL HIGH (ref 0.44–1.00)
GFR, Estimated: 41 mL/min — ABNORMAL LOW (ref >60)
Glucose, Bld: 103 mg/dL — ABNORMAL HIGH (ref 70–99)
Potassium: 3.7 mmol/L (ref 3.5–5.1)
Sodium: 139 mmol/L (ref 135–145)
Total Bilirubin: 0.5 mg/dL (ref 0.3–1.2)
Total Protein: 7.2 g/dL (ref 6.5–8.1)

## 2020-06-07 LAB — PROTIME-INR
INR: 1.5 — ABNORMAL HIGH (ref 0.8–1.2)
Prothrombin Time: 17.8 seconds — ABNORMAL HIGH (ref 11.4–15.2)

## 2020-06-07 NOTE — ED Provider Notes (Signed)
[facility] EMERGENCY DEPARTMENT Provider Note   CSN: [id number] Arrival date & time: 06/07/20  2208     History Chief Complaint  Patient presents with  . Weakness    a  . Aphasia    Yvonne Bailey is a 80 y.o. female.  The history is provided by the patient and medical records.  Weakness   80 year old female with history of coronary artery disease, carotid artery disease, hypertension, hyperlipidemia, ischemic cardiomyopathy, persistent A. fib on Eliquis, prior TIA and stroke right residual right sided deficits and speech disturbance, here with new left sided weakness.  Patient woke up from nap around 9:15pm and son noticed that she seemed very out of it.  She was last seen well prior to that around 3PM.  States generally she has some dexterity issues with her right hand, especially with her thumb, this is been ongoing since her prior stroke in 2018.  States when he was trying to give her her medications after she woke up from nap, her right side was completely uncoordinated (worse than normal) but her left arm also seemed weak.  States she was trying to talk but was not making any sense.  EMS called, and noticed similar deficits.  Symptoms resolved en route and patient now is back to baseline.  Patient reports she was unaware of events that son is describing, states she thought she was waking sense.  Prior to this she has been in usual state of health.  She does ambulate with cane at baseline.  MR MRA HEAD WO CONTRAST MR BRAIN W WO CONTRAST 11/13/2017 IMPRESSION: This MR angiogram of intracranial arteries shows the following: 1. There appears to be occlusion of the right vertebral artery that has occurred since the 03/02/2017 CT angiogram. Additionally there is stenosis in the left vertebral artery, likely unchanged. There also appears to be occlusion of flow in the mid basilar artery that was not present on the previous CT angiogram. 2. There is  moderate stenosis of the carotid siphons bilaterally, also seen in the 03/02/2017 CTA. 3. Reduced branching pattern of the M2 segment of the left middle cerebral artery. 4. There did not appear to be any cerebral aneurysms noted. One small outpouching noted in the supraclinoid left ICA most likely represents an arterial siphon.  Past Medical History:  Diagnosis Date  . Bilateral leg edema 07/19/2015  . CAD (coronary artery disease)    a. anterior MI s/p PCI in 1992. b. cath 08/2015 with severe three-vessel CAD turned down for CABG and underwent DESx5 to prox Cx/OM2/D1/oRCA/mRCA  . Carotid artery disease ([place])    a. carotid duplex 03/2015 showed 1-39% BICA, normal subclavian arteries, chronically occluded left vertebral, f/u recommended only PRN.  . DVT (deep venous thrombosis) ([place])    X1  . History of nuclear stress test    a. Myoview 6/17: EF 20-25%, mid anteroseptal, apical anterior, apical septal, apical inferior, apical lateral and apical scar, no ischemia, intermediate risk  . HTN (hypertension)   . Hyperlipidemia   . Hypokalemia   . Hypothyroidism   . Ischemic cardiomyopathy    a. Echo 6/17: EF 20-25%, apex appears akinetic, MAC, moderate MR, moderate LAE, mild RVE, trivial PI, PASP 47 mmHg (needs repeat with Definity contrast).  b. Limited echo with Definity contrast 7/17: EF 25-30%, moderate to severe LAE. c. Limited Echo 2018 showed EF 40-45%.  . Lacunar infarction ([place]) 12/17/2012   Dr Krista Blue, Neurology   . Leukocytosis   . 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 Longstanding persistent atrial fibrillation ([place])   . MI (myocardial infarction) ([place]) 1992  . PAD (peripheral artery disease) ([facility])    Right SFA occlusion, severe disease left CFA and SFA  . Prediabetes 07/28/2016  . Stage 3 chronic kidney disease ([place])   . Stroke [facility])    a. 02/2017 in setting of noncompliance with Eliquis  . TIA (transient ischemic attack)   . Tricuspid regurgitation     Patient Active  Problem List   Diagnosis Date Noted  . Anxiety and depression 03/28/2020  . Overactive bladder 03/28/2020  . Lack of motivation 12/21/2019  . 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 Aphasia as late effect of cerebrovascular accident (CVA)   . Acute ischemic cerebrovascular accident (CVA) involving left middle cerebral artery [facility])   . Coronary artery disease involving native coronary artery without angina pectoris   . Stage 3 chronic kidney disease ([place])   . Prediabetes 07/28/2016  . DOE (dyspnea on exertion) 07/09/2016  . Gait instability 07/09/2016  . Cardiomyopathy, ischemic   . Chronic systolic heart failure ([place]) 08/21/2015  . Bilateral leg edema 07/19/2015  . Atrial fibrillation ([place]) 07/19/2015  . Urinary urgency 03/30/2015  . Lacunar infarction ([place]) 12/17/2012  . Small vessel disease, cerebrovascular 12/17/2012  . CAROTID BRUIT, RIGHT 08/10/2008  . Peripheral vascular disease ([place]) 06/04/2007  . Diverticulosis of large intestine 06/04/2007  . Hypothyroidism 02/24/2007  . Dyslipidemia 02/24/2007  . Essential hypertension 02/24/2007  . Acute thromboembolism of deep veins of lower extremity ([place]) 01/21/2007  . Coronary atherosclerosis 10/29/2006    Past Surgical History:  Procedure Laterality Date  . APPENDECTOMY     at hysterectomy and USO for fibroids, Dr. Ysidro Evert  . CARDIAC CATHETERIZATION  1992   Dr Eustace Quail  . CARDIAC CATHETERIZATION N/A 09/06/2015   Procedure: Right/Left Heart Cath and Coronary Angiography;  Surgeon: Burnell Blanks, MD;  Location: [place] CV LAB;  Service: Cardiovascular;  Laterality: N/A;  . CARDIAC CATHETERIZATION N/A 09/07/2015   Procedure: Coronary Stent Intervention;  Surgeon: Burnell Blanks, MD;  Location: [place] CV LAB;  Service: Cardiovascular;  Laterality: N/A;  . COLONOSCOPY     negative; 2008, Dr. Delfin Edis  . fracture LLE     '94; pinned  . IR ANGIO INTRA EXTRACRAN SEL COM CAROTID INNOMINATE  BILAT MOD SED  03/02/2017  . IR ANGIO INTRA EXTRACRAN SEL COM CAROTID INNOMINATE BILAT MOD SED  04/23/2018  . IR ANGIO VERTEBRAL SEL SUBCLAVIAN INNOMINATE BILAT MOD SED  04/23/2018  . IR ANGIO VERTEBRAL SEL VERTEBRAL UNI R MOD SED  03/02/2017  . IR RADIOLOGIST EVAL & MGMT  04/28/2017  . IR US GUIDE VASC ACCESS RIGHT  04/23/2018  . RADIOLOGY WITH ANESTHESIA N/A 03/02/2017   Procedure: RADIOLOGY WITH ANESTHESIA;  Surgeon: Luanne Bras, MD;  Location: [place];  Service: Radiology;  Laterality: N/A;  . TONSILLECTOMY    . TOTAL ABDOMINAL HYSTERECTOMY     & BSO for fibroids     OB History    Gravida  4   Para  1   Term  1   Preterm      AB  3   Living  1     SAB  3   IAB      Ectopic      Multiple      Live Births  1           Family History  Problem Relation Age of Onset  .  Diabetes Mother   . Hypertension Mother   . Stroke Brother        ?> 55  . Coronary artery disease Brother        stent in 9s  . Cancer Neg Hx     Social History   Tobacco Use  . Smoking status: Former Smoker    Quit date: 03/11/1990    Years since quitting: 30.2  . Smokeless tobacco: Never Used  . Tobacco comment: smoked [place], up to < 5 cigarettes  Vaping Use  . Vaping Use: Never used  Substance Use Topics  . Alcohol use: No  . Drug use: No    Home Medications Prior to Admission medications   Medication Sig Start Date End Date Taking? Authorizing Provider  amLODipine (NORVASC) 2.5 MG tablet TAKE 1 TABLET(2.5 MG) BY MOUTH DAILY. FOLLOW-UP APPOINTMENT DUE IN DEC Patient taking differently: Take 2.5 mg by mouth daily. 05/08/20   Binnie Rail, MD  aspirin EC 81 MG tablet Take 1 tablet (81 mg total) by mouth daily. 04/21/17   Imogene Burn, PA-C  atorvastatin (LIPITOR) 80 MG tablet TAKE 1 TABLET(80 MG) BY MOUTH DAILY Patient taking differently: Take 80 mg by mouth daily. 12/27/19   Burns, Claudina Lick, MD  benazepril (LOTENSIN) 20 MG tablet TAKE 1 AND 1/2 TABLETS(30 MG) BY  MOUTH DAILY Patient taking differently: Take 30 mg by mouth daily. 03/23/20   Binnie Rail, MD  brimonidine (ALPHAGAN) 0.2 % ophthalmic solution Place 1 drop into both eyes 3 (three) times daily.  12/24/17   [provider]  dorzolamide-timolol (COSOPT) 22.3-6.8 MG/ML ophthalmic solution Place 1 drop into both eyes 2 (two) times daily.     [provider]  ELIQUIS 5 MG TABS tablet TAKE 1 TABLET TWICE DAILY Patient taking differently: Take 5 mg by mouth 2 (two) times daily. 02/28/20   Burnell Blanks, MD  furosemide (LASIX) 20 MG tablet Take 1 tablet (20 mg total) by mouth daily. 08/03/19   Imogene Burn, PA-C  levothyroxine (SYNTHROID) 88 MCG tablet TAKE 1 TABLET(88 MCG) BY MOUTH DAILY Patient taking differently: Take 88 mcg by mouth daily before breakfast. 12/22/19   Burns, Claudina Lick, MD  metoprolol succinate (TOPROL-XL) 50 MG 24 hr tablet Take 1 tablet (50 mg total) by mouth daily. 08/03/19   Imogene Burn, PA-C  mirabegron ER (MYRBETRIQ) 25 MG TB24 tablet Take 1 tablet (25 mg total) by mouth daily. 03/28/20   Binnie Rail, MD  nitroGLYCERIN (NITROSTAT) 0.4 MG SL tablet Place 1 tablet (0.4 mg total) under the tongue every 5 (five) minutes as needed for chest pain. 09/08/15   Arbutus Leas, NP  sertraline (ZOLOFT) 50 MG tablet Take 1 tablet (50 mg total) by mouth daily. 05/04/20   Binnie Rail, MD  spironolactone (ALDACTONE) 25 MG tablet TAKE 1 TABLET(25 MG) BY MOUTH DAILY Patient taking differently: Take 25 mg by mouth daily. 12/22/19   Burns, Claudina Lick, MD  travoprost, benzalkonium, (TRAVATAN) 0.004 % ophthalmic solution Place 1 drop into both eyes at bedtime.    [provider]    Allergies    Definity [perflutren lipid microsphere] and Cilostazol  Review of Systems   Review of Systems  Neurological: Positive for weakness.  All other systems reviewed and are negative.   Physical Exam Updated Vital Signs BP (!) 152/61 (BP Location: Right Arm)    Pulse 64   Temp 98 F (36.7 C) (Oral)   Resp Marland Kitchen)  21   Ht _0  (1.702 m)   Wt 79.8 kg   BMI 27.55 kg/m   Physical Exam Vitals and nursing note reviewed.  Constitutional:      Appearance: She is well-developed.  HENT:     Head: Normocephalic and atraumatic.     Mouth/Throat:     Comments: Poor dentition Eyes:     Conjunctiva/sclera: Conjunctivae normal.     Pupils: Pupils are equal, round, and reactive to light.  Cardiovascular:     Rate and Rhythm: Normal rate and regular rhythm.     Heart sounds: Normal heart sounds.  Pulmonary:     Effort: Pulmonary effort is normal. No respiratory distress.     Breath sounds: Normal breath sounds. No rhonchi.  Abdominal:     General: Bowel sounds are normal.     Palpations: Abdomen is soft.     Tenderness: There is no abdominal tenderness. There is no rebound.  Musculoskeletal:        General: Normal range of motion.     Cervical back: Normal range of motion.  Skin:    General: Skin is warm and dry.  Neurological:     Mental Status: She is alert and oriented to person, place, and time.     Comments: AAOx3, answering questions and following commands appropriately; grossly equal strength UE and LE bilaterally; CN grossly intact; moves all extremities appropriately without ataxia; minor difficulty with finger to nose finger on right (chronic) and normal on left, speech grossly clear (somewhat off due to lack of dentition) but family reports normal     ED Results / Procedures / Treatments   Labs (all labs ordered are listed, but only abnormal results are displayed) Labs Reviewed  COMPREHENSIVE METABOLIC PANEL - Abnormal; Notable for the following components:      Result Value   Glucose, Bld 103 (*)    BUN 27 (*)    Creatinine, Ser 1.31 (*)    GFR, Estimated 41 (*)    All other components within normal limits  CBC - Abnormal; Notable for the following components:   RBC 3.66 (*)    MCV 101.9 (*)    All other components within  normal limits  PROTIME-INR - Abnormal; Notable for the following components:   Prothrombin Time 17.8 (*)    INR 1.5 (*)    All other components within normal limits  URINALYSIS, ROUTINE W REFLEX MICROSCOPIC  CBG MONITORING, ED    EKG None  Radiology CT Head Wo Contrast  Result Date: 06/07/2020 CLINICAL DATA:  Acute onset aphasia and left-sided weakness with subsequent resolution EXAM: CT HEAD WITHOUT CONTRAST TECHNIQUE: Contiguous axial images were obtained from the base of the skull through the vertex without intravenous contrast. COMPARISON:  MRI 11/13/2017 (images only), CT 03/02/2017 FINDINGS: Brain: Encephalomalacia in the left parietal lobe compatible with sequela prior infarct in a region of diffusion restriction seen on comparison outside MR imaging. Additional more remote right frontal lobe encephalomalacia is unchanged from comparison imaging as well. Additional scattered hypoattenuating foci in the bilateral basal ganglia and bilateral cerebellar hemispheres likely reflecting sequela of smaller lacunar type infarcts. Findings are on a background of diffuse patchy and confluent regions of white matter hypoattenuation suggesting chronic microvascular angiopathy as well as more diffuse parenchymal volume loss. No convincing CT evident areas of acute infarction though detection may be limited by the presence of such extensive chronic findings. No evidence of acute hemorrhage, hydrocephalus, new extra-axial collection, visible mass lesion or mass  effect. Vascular: Calcification of the vasculature at the level of the skull base. No concerning hyperdense vessel or other acute vascular abnormality. Skull: Hyperostosis frontalis interna, a typically benign incidental finding. A more focal sclerotic region in the right frontal bone without significant aggressive features is quite similar to comparison imaging. No acute fracture or other conspicuous osseous abnormality. No significant scalp swelling  or hematoma. Sinuses/Orbits: Paranasal sinuses and mastoid air cells are predominantly clear. Included orbital structures are unremarkable. Other: None IMPRESSION: 1. No acute intracranial abnormality. If there is persisting concern for acute infarction, MRI is more sensitive and specific for early features of ischemia. 2. Encephalomalacia in the left parietal lobe and right frontal lobe compatible with sequela of prior infarcts in a region of diffusion restriction seen on comparison outside MR imaging. 3. Background of chronic microvascular angiopathy and more diffuse parenchymal volume loss. 4. Intracranial atherosclerosis. 5. Sclerotic focus in the right frontal bone without particularly aggressive features and a stable appearance from prior imaging. Electronically Signed   By: Lovena Le M.D.   On: 06/07/2020 23:11   MR ANGIO HEAD WO CONTRAST  Result Date: 06/08/2020 CLINICAL DATA:  Transient ischemic attack EXAM: MRI HEAD WITHOUT CONTRAST MRA HEAD WITHOUT CONTRAST TECHNIQUE: Multiplanar, multiecho pulse sequences of the brain and surrounding structures were obtained without intravenous contrast. Angiographic images of the head were obtained using MRA technique without contrast. COMPARISON:  None. FINDINGS: MRI HEAD FINDINGS Brain: No acute infarct, mass effect or extra-axial collection. Hemosiderin deposition over the posterior left hemisphere. There is multifocal hyperintense T2-weighted signal within the white matter. Generalized volume loss without a clear lobar predilection. Old bilateral cerebellar infarcts and old right basal ganglia infarct. Vascular: Major flow voids are preserved. Skull and upper cervical spine: Normal calvarium and skull base. Visualized upper cervical spine and soft tissues are normal. Sinuses/Orbits:No paranasal sinus fluid levels or advanced mucosal thickening. No mastoid or middle ear effusion. Normal orbits. MRA HEAD FINDINGS POSTERIOR CIRCULATION: Poor flow related  enhancement in the vertebral and basilar arteries, possibly artifactual secondary to motion. --Superior cerebellar arteries: Normal. --Posterior cerebral arteries: Normal. ANTERIOR CIRCULATION: --Intracranial internal carotid arteries: Normal. --Anterior cerebral arteries (ACA): Normal. --Middle cerebral arteries (MCA): Normal. ANATOMIC VARIANTS: Both P comms are patent. IMPRESSION: 1. No acute intracranial abnormality. 2. Old bilateral cerebellar and right basal ganglia infarcts. 3. Poor flow related enhancement in the vertebral and basilar arteries, possibly artifactual secondary to motion. Otherwise normal MRA. Electronically Signed   By: Ulyses Jarred M.D.   On: 06/08/2020 03:05   MR BRAIN WO CONTRAST  Result Date: 06/08/2020 CLINICAL DATA:  Transient ischemic attack EXAM: MRI HEAD WITHOUT CONTRAST MRA HEAD WITHOUT CONTRAST TECHNIQUE: Multiplanar, multiecho pulse sequences of the brain and surrounding structures were obtained without intravenous contrast. Angiographic images of the head were obtained using MRA technique without contrast. COMPARISON:  None. FINDINGS: MRI HEAD FINDINGS Brain: No acute infarct, mass effect or extra-axial collection. Hemosiderin deposition over the posterior left hemisphere. There is multifocal hyperintense T2-weighted signal within the white matter. Generalized volume loss without a clear lobar predilection. Old bilateral cerebellar infarcts and old right basal ganglia infarct. Vascular: Major flow voids are preserved. Skull and upper cervical spine: Normal calvarium and skull base. Visualized upper cervical spine and soft tissues are normal. Sinuses/Orbits:No paranasal sinus fluid levels or advanced mucosal thickening. No mastoid or middle ear effusion. Normal orbits. MRA HEAD FINDINGS POSTERIOR CIRCULATION: Poor flow related enhancement in the vertebral and basilar arteries, possibly artifactual secondary to motion. --Superior  cerebellar arteries: Normal. --Posterior cerebral  arteries: Normal. ANTERIOR CIRCULATION: --Intracranial internal carotid arteries: Normal. --Anterior cerebral arteries (ACA): Normal. --Middle cerebral arteries (MCA): Normal. ANATOMIC VARIANTS: Both P comms are patent. IMPRESSION: 1. No acute intracranial abnormality. 2. Old bilateral cerebellar and right basal ganglia infarcts. 3. Poor flow related enhancement in the vertebral and basilar arteries, possibly artifactual secondary to motion. Otherwise normal MRA. Electronically Signed   By: Ulyses Jarred M.D.   On: 06/08/2020 03:05    Procedures Procedures   Medications Ordered in ED Medications - No data to display  ED Course  I have reviewed the triage vital signs and the nursing notes.  Pertinent labs & imaging results that were available during my care of the patient were reviewed by me and considered in my medical decision making (see chart for details).    MDM Rules/Calculators/A&P                         80 y.o. F here with symptoms concerning for TIA.  LKW around 3PM, awoke from nap around 9:15PM with incomprehensible speech and left sided weakness.  Symptoms fully resolved en route.  Patient awake, alert, oriented to her baseline on exam.  Some minor right sided deficits in right hand noted (chronic).  CT without acute findings.  Does have noted prior infarcts.  Based on last MRA she does have some chronic right vertebral artery occlusions as well as stenosis of other areas.  It appears she was sent to vascular for this, but I cannot find any office visits there and she has not had follow-up in neurology office in over 2 years.  Will discuss with neuro for recommendations.  12:05 AM Spoke with neurohospitalist, Dr. Leonel Ramsay-- as she is already anticoagulated, will get MRI/MRA and if negative can follow-up with OP neurology.  Patient and family agreeable.  MRI/MRA negative for acute findings today.  Patient remains at baseline since time of arrival.  VSS.  Feel she is stable for  discharge.  Continue current regimen.  Will need close follow-up with neurology and PCP on outpatient basis.  Return here for new concerns.  Final Clinical Impression(s) / ED Diagnoses Final diagnoses:  Transient disorientation    Rx / DC Orders ED Discharge Orders    None       Larene Pickett, PA-C 06/08/20 0383    Veryl Speak, MD 06/08/20 0700

## 2020-06-07 NOTE — ED Triage Notes (Signed)
Per EMS, pt from home, reports last seen normal 6 hrs ago, found at 9:15 after waking up from a nap to have aphasia and left sided weakness.  While transporting symptoms have resolved.  Hx of stroke w/ right sided deficits reported, no deficits at this time.

## 2020-06-08 ENCOUNTER — Emergency Department (HOSPITAL_COMMUNITY): Payer: Medicare HMO

## 2020-06-08 DIAGNOSIS — R531 Weakness: Secondary | ICD-10-CM | POA: Diagnosis not present

## 2020-06-08 DIAGNOSIS — R410 Disorientation, unspecified: Secondary | ICD-10-CM | POA: Diagnosis not present

## 2020-06-08 DIAGNOSIS — I10 Essential (primary) hypertension: Secondary | ICD-10-CM | POA: Diagnosis not present

## 2020-06-08 DIAGNOSIS — G459 Transient cerebral ischemic attack, unspecified: Secondary | ICD-10-CM | POA: Diagnosis not present

## 2020-06-08 LAB — URINALYSIS, ROUTINE W REFLEX MICROSCOPIC
Bilirubin Urine: NEGATIVE
Glucose, UA: NEGATIVE mg/dL
Ketones, ur: NEGATIVE mg/dL
Nitrite: NEGATIVE
Protein, ur: NEGATIVE mg/dL
RBC / HPF: >50 RBC/hpf — ABNORMAL HIGH (ref 0–5)
Specific Gravity, Urine: 1.017 (ref 1.005–1.030)
pH: 5 (ref 5.0–8.0)

## 2020-06-08 LAB — CBG MONITORING, ED: Glucose-Capillary: 86 mg/dL (ref 70–99)

## 2020-06-08 MED ORDER — LORAZEPAM 2 MG/ML IJ SOLN
1.0000 mg | Freq: Once | INTRAMUSCULAR | Status: AC | PRN
  Administered 2020-06-08: 1 mg via INTRAVENOUS
  Filled 2020-06-08: qty 1

## 2020-06-08 NOTE — ED Notes (Signed)
Pt ambulated to restroom with the use of a walker

## 2020-06-08 NOTE — ED Notes (Signed)
RN was able to get intouch w/ pt's son, he is on the way to pick the pt up.

## 2020-06-08 NOTE — ED Notes (Signed)
Pt and son to MRI

## 2020-06-08 NOTE — Discharge Instructions (Signed)
MRI today did not show any new strokes, just old findings. Continue current medication regimen. Follow-up with your primary care doctor. Also recommend close follow-up with neurology-- call to set this up. Return here for new concerns.

## 2020-06-08 NOTE — Progress Notes (Signed)
Subjective:    Patient ID: Yvonne Bailey, female    DOB: 06-15-1940, 80 y.o.   MRN: [id number]  HPI The patient is here for follow up from the ED.  She went to the ED 3/30 for weakness and aphasia.  She presented with new left sided weakness.  She woke up from a nap and her son noticed that she seemed out of it.  Her right side seem more uncoordinated that her baseline weakness and her left side was weak. She was trying to talk but not making sense.  Her deficits resolved while in transit to the ED by EMS.  Patient was unaware she was not making sense.    Imaging did not reveal acute infarct/abnormality.  Neurology was consulted - advised no change in medications and follow up with neuro as an outpatient.      Per her son she was not taking her medication consistently.  Sometimes she would not take her medication and she was not taking her Eliquis on schedule-every 12 hours.  Since the emergency room she has not had any new weakness, numbness/tingling, difficulty speaking, difficulty swallowing or other symptoms concerning for TIA or stroke.  Her son does feel that she needs more assistance at home and wonders what options are available.  He is concerned about her inconsistently taking her medication not doing things that she should be doing.  [facility] the [facility] center [facility] 9-5.    Medications and allergies reviewed with patient and updated if appropriate.  Patient Active Problem List   Diagnosis Date Noted  . Anxiety and depression 03/28/2020  . Overactive bladder 03/28/2020  . Lack of motivation 12/21/2019  . 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 Aphasia as late effect of cerebrovascular accident (CVA)   . Acute ischemic cerebrovascular accident (CVA) involving left middle cerebral artery [facility])   . Coronary artery disease involving native coronary artery without angina pectoris   . Stage 3 chronic kidney disease ([place])   . Prediabetes 07/28/2016  .  DOE (dyspnea on exertion) 07/09/2016  . Gait instability 07/09/2016  . Cardiomyopathy, ischemic   . Chronic systolic heart failure ([place]) 08/21/2015  . Bilateral leg edema 07/19/2015  . Atrial fibrillation ([place]) 07/19/2015  . Urinary urgency 03/30/2015  . Lacunar infarction ([place]) 12/17/2012  . Small vessel disease, cerebrovascular 12/17/2012  . CAROTID BRUIT, RIGHT 08/10/2008  . Peripheral vascular disease ([place]) 06/04/2007  . Diverticulosis of large intestine 06/04/2007  . Hypothyroidism 02/24/2007  . Dyslipidemia 02/24/2007  . Essential hypertension 02/24/2007  . Acute thromboembolism of deep veins of lower extremity ([place]) 01/21/2007  . Coronary atherosclerosis 10/29/2006    Current Outpatient Medications on File Prior to Visit  Medication Sig Dispense Refill  . amLODipine (NORVASC) 2.5 MG tablet TAKE 1 TABLET(2.5 MG) BY MOUTH DAILY. FOLLOW-UP APPOINTMENT DUE IN DEC (Patient taking differently: Take 2.5 mg by mouth daily.) 90 tablet 0  . aspirin EC 81 MG tablet Take 1 tablet (81 mg total) by mouth daily. 90 tablet 3  . atorvastatin (LIPITOR) 80 MG tablet TAKE 1 TABLET(80 MG) BY MOUTH DAILY (Patient taking differently: Take 80 mg by mouth daily.) 90 tablet 1  . benazepril (LOTENSIN) 20 MG tablet TAKE 1 AND 1/2 TABLETS(30 MG) BY MOUTH DAILY (Patient taking differently: Take 30 mg by mouth daily.) 135 tablet 1  . brimonidine (ALPHAGAN) 0.2 % ophthalmic solution Place 1 drop into both eyes 3 (three) times daily.     . dorzolamide-timolol (COSOPT)  22.3-6.8 MG/ML ophthalmic solution Place 1 drop into both eyes 2 (two) times daily.     Marland Kitchen ELIQUIS 5 MG TABS tablet TAKE 1 TABLET TWICE DAILY (Patient taking differently: Take 5 mg by mouth 2 (two) times daily.) 180 tablet 1  . furosemide (LASIX) 20 MG tablet Take 1 tablet (20 mg total) by mouth daily. 90 tablet 3  . levothyroxine (SYNTHROID) 88 MCG tablet TAKE 1 TABLET(88 MCG) BY MOUTH DAILY (Patient taking differently: Take 88 mcg by mouth daily  before breakfast.) 90 tablet 1  . metoprolol succinate (TOPROL-XL) 50 MG 24 hr tablet Take 1 tablet (50 mg total) by mouth daily. 90 tablet 3  . mirabegron ER (MYRBETRIQ) 25 MG TB24 tablet Take 1 tablet (25 mg total) by mouth daily. 30 tablet 5  . nitroGLYCERIN (NITROSTAT) 0.4 MG SL tablet Place 1 tablet (0.4 mg total) under the tongue every 5 (five) minutes as needed for chest pain. 25 tablet 1  . sertraline (ZOLOFT) 50 MG tablet Take 1 tablet (50 mg total) by mouth daily. 30 tablet 5  . spironolactone (ALDACTONE) 25 MG tablet TAKE 1 TABLET(25 MG) BY MOUTH DAILY (Patient taking differently: Take 25 mg by mouth daily.) 90 tablet 1  . travoprost, benzalkonium, (TRAVATAN) 0.004 % ophthalmic solution Place 1 drop into both eyes at bedtime.     No current facility-administered medications on file prior to visit.    Past Medical History:  Diagnosis Date  . Bilateral leg edema 07/19/2015  . CAD (coronary artery disease)    a. anterior MI s/p PCI in 1992. b. cath 08/2015 with severe three-vessel CAD turned down for CABG and underwent DESx5 to prox Cx/OM2/D1/oRCA/mRCA  . Carotid artery disease ([place])    a. carotid duplex 03/2015 showed 1-39% BICA, normal subclavian arteries, chronically occluded left vertebral, f/u recommended only PRN.  . DVT (deep venous thrombosis) ([place])    X1  . History of nuclear stress test    a. Myoview 6/17: EF 20-25%, mid anteroseptal, apical anterior, apical septal, apical inferior, apical lateral and apical scar, no ischemia, intermediate risk  . HTN (hypertension)   . Hyperlipidemia   . Hypokalemia   . Hypothyroidism   . Ischemic cardiomyopathy    a. Echo 6/17: EF 20-25%, apex appears akinetic, MAC, moderate MR, moderate LAE, mild RVE, trivial PI, PASP 47 mmHg (needs repeat with Definity contrast).  b. Limited echo with Definity contrast 7/17: EF 25-30%, moderate to severe LAE. c. Limited Echo 2018 showed EF 40-45%.  . Lacunar infarction ([place]) 12/17/2012   Dr Krista Blue,  Neurology   . Leukocytosis   . 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 Longstanding persistent atrial fibrillation ([place])   . MI (myocardial infarction) ([place]) 1992  . PAD (peripheral artery disease) ([facility])    Right SFA occlusion, severe disease left CFA and SFA  . Prediabetes 07/28/2016  . Stage 3 chronic kidney disease ([place])   . Stroke [facility])    a. 02/2017 in setting of noncompliance with Eliquis  . TIA (transient ischemic attack)   . Tricuspid regurgitation     Past Surgical History:  Procedure Laterality Date  . APPENDECTOMY     at hysterectomy and USO for fibroids, Dr. Ysidro Evert  . CARDIAC CATHETERIZATION  1992   Dr Eustace Quail  . CARDIAC CATHETERIZATION N/A 09/06/2015   Procedure: Right/Left Heart Cath and Coronary Angiography;  Surgeon: Burnell Blanks, MD;  Location: [facility];  Service: Cardiovascular;  Laterality: N/A;  . CARDIAC CATHETERIZATION N/A 09/07/2015  Procedure: Coronary Stent Intervention;  Surgeon: Burnell Blanks, MD;  Location: [facility];  Service: Cardiovascular;  Laterality: N/A;  . COLONOSCOPY     negative; 2008, Dr. Delfin Edis  . fracture LLE     '94; pinned  . IR ANGIO INTRA EXTRACRAN SEL COM CAROTID INNOMINATE BILAT MOD SED  03/02/2017  . IR ANGIO INTRA EXTRACRAN SEL COM CAROTID INNOMINATE BILAT MOD SED  04/23/2018  . IR ANGIO VERTEBRAL SEL SUBCLAVIAN INNOMINATE BILAT MOD SED  04/23/2018  . IR ANGIO VERTEBRAL SEL VERTEBRAL UNI R MOD SED  03/02/2017  . IR RADIOLOGIST EVAL & MGMT  04/28/2017  . IR US GUIDE VASC ACCESS RIGHT  04/23/2018  . RADIOLOGY WITH ANESTHESIA N/A 03/02/2017   Procedure: RADIOLOGY WITH ANESTHESIA;  Surgeon: Luanne Bras, MD;  Location: [facility];  Service: Radiology;  Laterality: N/A;  . TONSILLECTOMY    . TOTAL ABDOMINAL HYSTERECTOMY     & BSO for fibroids    Social History   Socioeconomic History  . Marital status: Married    Spouse name: [name]  . Number of children: 1  . Years of education:  college  . Highest education level: Not on file  Occupational History  . Occupation: Pharmacist, hospital    Comment: Retired  Tobacco Use  . Smoking status: Former Smoker    Quit date: 03/11/1990    Years since quitting: 30.2  . Smokeless tobacco: Never Used  . Tobacco comment: smoked [place], up to < 5 cigarettes  Vaping Use  . Vaping Use: Never used  Substance and Sexual Activity  . Alcohol use: No  . Drug use: No  . Sexual activity: Not Currently    Birth control/protection: Surgical  Other Topics Concern  . 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 08/31/09- designated party form signed appointing, husband Marely Apgar; ok to leave msg on home phone [phone].      Caffeine Small Amount one cup very rare.   Right handed.   One child- [name]   Social Determinants of Health   Financial Resource Strain: Not on file  Food Insecurity: Not on file  Transportation Needs: Not on file  Physical Activity: Not on file  Stress: Not on file  Social Connections: Not on file    Family History  Problem Relation Age of Onset  . Diabetes Mother   . Hypertension Mother   . Stroke Brother        ?> 55  . Coronary artery disease Brother        stent in 74s  . Cancer Neg Hx     Review of Systems  Constitutional: Negative for appetite change and fever.  Respiratory: Negative for cough, shortness of breath and wheezing.   Cardiovascular: Negative for chest pain, palpitations and leg swelling.  Neurological: Negative for dizziness, light-headedness and headaches.       No new numbness, tingling or weakness-she is at her baseline       Objective:   Vitals:   06/09/20 1443  BP: 126/70  Pulse: 72  Temp: 98.1 F (36.7 C)  SpO2: 99%   BP Readings from Last 3 Encounters:  06/09/20 126/70  06/08/20 127/66  03/28/20 128/70   Wt Readings from Last 3 Encounters:  06/09/20 167 lb 6.4 oz (75.9 kg)  06/07/20 175  lb 14.8 oz (79.8 kg)  03/28/20 176 lb (79.8 kg)   Body mass index is  26.22 kg/m.   Physical Exam    Constitutional: Appears well-developed and well-nourished. No distress.  HENT:  Head: Normocephalic and atraumatic.  Neck: Neck supple. No tracheal deviation present. No thyromegaly present.  No cervical lymphadenopathy Cardiovascular: Normal rate, regular rhythm and normal heart sounds.   No murmur heard. No carotid bruit .  No edema Pulmonary/Chest: Effort normal and breath sounds normal. No respiratory distress. No has no wheezes. No rales.  Skin: Skin is warm and dry. Not diaphoretic.  Psychiatric: Normal mood and affect. Behavior is normal.      MR ANGIO HEAD WO CONTRAST CLINICAL DATA:  Transient ischemic attack  EXAM: MRI HEAD WITHOUT CONTRAST  MRA HEAD WITHOUT CONTRAST  TECHNIQUE: Multiplanar, multiecho pulse sequences of the brain and surrounding structures were obtained without intravenous contrast. Angiographic images of the head were obtained using MRA technique without contrast.  COMPARISON:  None.  FINDINGS: MRI HEAD FINDINGS  Brain: No acute infarct, mass effect or extra-axial collection. Hemosiderin deposition over the posterior left hemisphere. There is multifocal hyperintense T2-weighted signal within the white matter. Generalized volume loss without a clear lobar predilection. Old bilateral cerebellar infarcts and old right basal ganglia infarct.  Vascular: Major flow voids are preserved.  Skull and upper cervical spine: Normal calvarium and skull base. Visualized upper cervical spine and soft tissues are normal.  Sinuses/Orbits:No paranasal sinus fluid levels or advanced mucosal thickening. No mastoid or middle ear effusion. Normal orbits.  MRA HEAD FINDINGS  POSTERIOR CIRCULATION:  Poor flow related enhancement in the vertebral and basilar arteries, possibly artifactual secondary to motion.  --Superior cerebellar arteries:  Normal.  --Posterior cerebral arteries: Normal.  ANTERIOR CIRCULATION:  --Intracranial internal carotid arteries: Normal.  --Anterior cerebral arteries (ACA): Normal.  --Middle cerebral arteries (MCA): Normal.  ANATOMIC VARIANTS: Both P comms are patent.  IMPRESSION: 1. No acute intracranial abnormality. 2. Old bilateral cerebellar and right basal ganglia infarcts. 3. Poor flow related enhancement in the vertebral and basilar arteries, possibly artifactual secondary to motion. Otherwise normal MRA.  Electronically Signed   By: Ulyses Jarred M.D.   On: 06/08/2020 03:05 MR BRAIN WO CONTRAST CLINICAL DATA:  Transient ischemic attack  EXAM: MRI HEAD WITHOUT CONTRAST  MRA HEAD WITHOUT CONTRAST  TECHNIQUE: Multiplanar, multiecho pulse sequences of the brain and surrounding structures were obtained without intravenous contrast. Angiographic images of the head were obtained using MRA technique without contrast.  COMPARISON:  None.  FINDINGS: MRI HEAD FINDINGS  Brain: No acute infarct, mass effect or extra-axial collection. Hemosiderin deposition over the posterior left hemisphere. There is multifocal hyperintense T2-weighted signal within the white matter. Generalized volume loss without a clear lobar predilection. Old bilateral cerebellar infarcts and old right basal ganglia infarct.  Vascular: Major flow voids are preserved.  Skull and upper cervical spine: Normal calvarium and skull base. Visualized upper cervical spine and soft tissues are normal.  Sinuses/Orbits:No paranasal sinus fluid levels or advanced mucosal thickening. No mastoid or middle ear effusion. Normal orbits.  MRA HEAD FINDINGS  POSTERIOR CIRCULATION:  Poor flow related enhancement in the vertebral and basilar arteries, possibly artifactual secondary to motion.  --Superior cerebellar arteries: Normal.  --Posterior cerebral arteries: Normal.  ANTERIOR CIRCULATION:  --Intracranial  internal carotid arteries: Normal.  --Anterior cerebral arteries (ACA): Normal.  --Middle cerebral arteries (MCA): Normal.  ANATOMIC VARIANTS: Both P comms are patent.  IMPRESSION: 1. No acute intracranial abnormality. 2. Old bilateral cerebellar and right basal ganglia infarcts. 3. Poor flow related enhancement in the vertebral and basilar arteries,  possibly artifactual secondary to motion. Otherwise normal MRA.  Electronically Signed   By: Ulyses Jarred M.D.   On: 06/08/2020 03:05     Assessment & Plan:    See Problem List for Assessment and Plan of chronic medical problems.    This visit occurred during the SARS-CoV-2 public health emergency.  Safety protocols were in place, including screening questions prior to the visit, additional usage of staff PPE, and extensive cleaning of exam room while observing appropriate contact time as indicated for disinfecting solutions.

## 2020-06-09 ENCOUNTER — Encounter: Payer: Self-pay | Admitting: Internal Medicine

## 2020-06-09 ENCOUNTER — Ambulatory Visit (INDEPENDENT_AMBULATORY_CARE_PROVIDER_SITE_OTHER): Payer: Medicare HMO | Admitting: Internal Medicine

## 2020-06-09 ENCOUNTER — Other Ambulatory Visit: Payer: Self-pay

## 2020-06-09 VITALS — BP 126/70 | HR 72 | Temp 98.1°F | Ht 67.0 in | Wt 167.4 lb

## 2020-06-09 DIAGNOSIS — I10 Essential (primary) hypertension: Secondary | ICD-10-CM | POA: Diagnosis not present

## 2020-06-09 DIAGNOSIS — G459 Transient cerebral ischemic attack, unspecified: Secondary | ICD-10-CM | POA: Diagnosis not present

## 2020-06-09 DIAGNOSIS — I69320 Aphasia following cerebral infarction: Secondary | ICD-10-CM

## 2020-06-09 NOTE — Assessment & Plan Note (Signed)
Chronic BP well controlled Continue amlodipine 2.5 mg qd, benazepril 30 mg qd, lasix 20 mg qd, metorprolol xl 50 mg qd, spironolactone 25 mg qd

## 2020-06-09 NOTE — Patient Instructions (Addendum)
Schedule a follow up with neurology  [facility] [address] [place], [place] [address]  Phone [phone]     Medications changes include :  none  - take your medication as prescribed.     A referral was ordered for [facility] social work.  Someone from their office will call you to schedule an appointment.    Please followup in 6 months

## 2020-06-10 DIAGNOSIS — G459 Transient cerebral ischemic attack, unspecified: Secondary | ICD-10-CM | POA: Insufficient documentation

## 2020-06-10 NOTE — Assessment & Plan Note (Signed)
Chronic Stable Advise follow-up with neurology Continue aspirin 81 mg daily and Eliquis 5 mg twice daily-stressed that this needs to be taken every 12 hours and she cannot forget this because it does increase her risk of another stroke Blood pressure well controlled

## 2020-06-10 NOTE — Assessment & Plan Note (Addendum)
New problem Recent event was likely a TIA from noncompliance with her medications Reviewed results from the emergency room Stressed that she needs to be taking her medication daily and she needs to be taking the Eliquis every 12 hours.  She now understands the consequences of not taking her medication as prescribed She is back at her baseline-no new concerning symptoms We will follow up with neurology Encouraged regular exercise Continue current blood pressure medications-BP well controlled Continue aspirin 81 mg daily, Eliquis 5 mg every 12 hours and atorvastatin 80 mg daily

## 2020-06-12 ENCOUNTER — Telehealth: Payer: Self-pay | Admitting: *Deleted

## 2020-06-12 NOTE — Chronic Care Management (AMB) (Signed)
  Care Management   Note  06/12/2020 Name: Yvonne Bailey MRN: [id number] DOB: [birth year]  Yvonne Bailey is a 80 y.o. year old female who is a primary care patient of Lawerance Bach, Bobette Mo, MD and is actively engaged with the care management team. I reached out to [name] by phone today to assist with scheduling an initial visit with the RN Case Manager  Follow up plan: Unsuccessful telephone outreach attempt made. The care management team will reach out to the patient again over the next 7 days. If patient returns call to provider office, please advise to call Embedded Care Management Care Guide Gwenevere Ghazi at [phone].  Gwenevere Ghazi  Care Guide, Embedded Care Coordination [facility] Management

## 2020-06-15 ENCOUNTER — Telehealth: Payer: Self-pay | Admitting: Internal Medicine

## 2020-06-15 NOTE — Telephone Encounter (Signed)
Patient's son dropped off a doctor's orders form to be completed and sign.   Form has been placed in providers box to review and sign.   Requesting form be email to [email or web address]

## 2020-06-16 NOTE — Telephone Encounter (Signed)
Form has been signed, Copy sent to scan, Emailed has requested.

## 2020-06-16 NOTE — Chronic Care Management (AMB) (Signed)
  Care Management   Note  06/16/2020 Name: Yvonne Bailey MRN: [id number] DOB: 07-06-1940  Yvonne Bailey is a 80 y.o. year old female who is a primary care patient of Lawerance Bach, Bobette Mo, MD and is actively engaged with the care management team. I reached out to [name] by phone today to assist with scheduling an initial visit with the RN Case Manager  Follow up plan: A second unsuccessful telephone outreach attempt made. The care management team will reach out to the patient again over the next 7 days. If patient returns call to provider office, please advise to call Embedded Care Management Care Guide Gwenevere Ghazi at [phone].  Gwenevere Ghazi  Care Guide, Embedded Care Coordination [facility] Management

## 2020-06-23 ENCOUNTER — Other Ambulatory Visit: Payer: Self-pay | Admitting: Internal Medicine

## 2020-06-26 NOTE — Chronic Care Management (AMB) (Signed)
  Chronic Care Management   Outreach Note  06/26/2020 Name: Yvonne Bailey MRN: [id number] DOB: Aug 08, 1940  Yvonne Bailey is a 80 y.o. year old female who is a primary care patient of Burns, Bobette Mo, MD. I reached out to [name] by phone today in response to a referral sent by [name] PCP, Dr. Lawerance Bach.     A telephone outreach was attempted today. The patient was referred to the case management team for assistance with care management and care coordination.   Follow Up Plan: The care management team will reach out to the patient again over the next 5 days.  If patient returns call to provider office, please advise to call Embedded Care Management Care Guide Gwenevere Ghazi at [phone].  Gwenevere Ghazi  Care Guide, Embedded Care Coordination [facility] Management

## 2020-06-28 NOTE — Chronic Care Management (AMB) (Signed)
  Chronic Care Management   Outreach Note  06/28/2020 Name: ASHAYLA SUBIA MRN: [id number] DOB: Sep 22, 1940  LOXLEY CIBRIAN is a 80 y.o. year old female who is a primary care patient of Burns, Bobette Mo, MD. I reached out to [name] by phone today in response to a referral sent by Ms. Christell Constant Abbett's PCP, Dr. Lawerance Bach     Third unsuccessful telephone outreach was attempted today. The patient was referred to the case management team for assistance with care management and care coordination. The patient's primary care provider has been notified of our unsuccessful attempts to make or maintain contact with the patient. The care management team is pleased to engage with this patient at any time in the future should he/she be interested in assistance from the care management team.   Follow Up Plan: We have been unable to make contact with the patient for follow up. The care management team is available to follow up with the patient after provider conversation with the patient regarding recommendation for care management engagement and subsequent re-referral to the care management team.    [facility] Guide, Embedded Care Coordination [facility]  Care Management

## 2020-06-28 NOTE — Telephone Encounter (Signed)
[name] called and stated the form needed to say, "Please take all medications before 9:30am." Inform as been added and emailed back.

## 2020-07-03 ENCOUNTER — Telehealth: Payer: Self-pay | Admitting: Internal Medicine

## 2020-07-03 NOTE — Telephone Encounter (Signed)
Patients son called and said that the patient will only be taking ELIQUIS 5 MG TABS tablet and levothyroxine (SYNTHROID) 88 MCG tablet during the day. He is requesting that the order be sent to [email or web address] and  [email or web address] please advise

## 2020-07-05 NOTE — Telephone Encounter (Signed)
Letter written

## 2020-07-05 NOTE — Telephone Encounter (Signed)
Emailed both letters today

## 2020-07-25 ENCOUNTER — Other Ambulatory Visit: Payer: Self-pay | Admitting: Cardiovascular Disease

## 2020-07-26 NOTE — Telephone Encounter (Addendum)
Eliquis 5mg  refill request received. Patient is 80 years old, weight-75.9kg, Crea-1.31 on 06/07/20, Diagnosis-PAF & CVA, and last seen by 06/09/20 on 08/03/2019 & canceled 11/03/19 appt, canceled 12/02/19 appt, and no showed to 12/15/19 appt-PT NEEDS AN APPT. Dose is appropriate based on dosing criteria.  Pt needs an appt with Cardiologist. Message sent to scheduling dept to reach out to the pt since she is pending this refill.   Pt has an appt set for 08/29/2020 at 145pm  With 08/31/2020. Will send in a refill.

## 2020-07-27 MED ORDER — ELIQUIS 5 MG PO TABS: 1.0000 | ORAL_TABLET | Freq: Two times a day (BID) | ORAL | 0 refills | Status: DC

## 2020-07-27 NOTE — Addendum Note (Signed)
Addended by: Satira Sark on: 07/27/2020 08:13 AM   Modules accepted: Orders

## 2020-07-27 NOTE — Telephone Encounter (Signed)
[employer] faxed refill clarification, refill sent in for 90 day supply with 1 refill, but note to pharmacy says no refills, pending appt will resend 90 day supply with 0 refills, until pt seen in office, pt overdue for f/u.

## 2020-08-04 ENCOUNTER — Other Ambulatory Visit: Payer: Self-pay | Admitting: Internal Medicine

## 2020-08-04 ENCOUNTER — Other Ambulatory Visit: Payer: Self-pay | Admitting: Cardiovascular Disease

## 2020-08-04 DIAGNOSIS — I10 Essential (primary) hypertension: Secondary | ICD-10-CM

## 2020-08-08 ENCOUNTER — Other Ambulatory Visit: Payer: Self-pay | Admitting: Physician Assistant

## 2020-08-08 DIAGNOSIS — I10 Essential (primary) hypertension: Secondary | ICD-10-CM

## 2020-08-28 NOTE — Progress Notes (Deleted)
Cardiology Office Note    Date:  08/28/2020   ID:  Yvonne Bailey, DOB 1940-06-05, MRN [id number]   PCP:  Binnie Rail, [place]  Cardiologist:  Lauree Chandler, MD *** Advanced Practice Provider:  No care team member to display Electrophysiologist:  None   [phone]   No chief complaint on file.   History of Present Illness:  Yvonne Bailey is a 80 y.o. female  with history of CAD status post anterior wall MI in 1992 treated with PCI.  Ejection fraction in 2007 was 45%, cath in 2017 severe three-vessel CAD turned down for CABG so underwent DES to the proximal and mid RCA and proximal circumflex, OM 2, diagonal 1 08/2015 LVEF 40 to 45% on echo 01/2016, also has PAF on Eliquis and had a CVA in 2018 due to noncompliance with Eliquis, hypertension, carotid artery disease, PAD, HLD.   Last saw Dr. Angelena Form 12/01/2017 at which she had some edema due to not taking Lasix for several days.  She was asked to take it daily.  Otherwise she was stable.   I saw patient 06/29/2019 at which time she had run out of Lasix for 4 months.  I asked her to take 40 mg daily for 4 days then half a tablet daily.  I saw the patient 08/03/2019 at which time she had run out of Toprol and her heart rate was a little fast.  She was taking her Lasix and had lost 11 pounds.      Past Medical History:  Diagnosis Date   Bilateral leg edema 07/19/2015   CAD (coronary artery disease)    a. anterior MI s/p PCI in 1992. b. cath 08/2015 with severe three-vessel CAD turned down for CABG and underwent DESx5 to prox Cx/OM2/D1/oRCA/mRCA   Carotid artery disease ([place])    a. carotid duplex 03/2015 showed 1-39% BICA, normal subclavian arteries, chronically occluded left vertebral, f/u recommended only PRN.   DVT (deep venous thrombosis) ([place])    X1   History of nuclear stress test    a. Myoview 6/17: EF 20-25%, mid anteroseptal, apical anterior, apical septal, apical inferior,  apical lateral and apical scar, no ischemia, intermediate risk   HTN (hypertension)    Hyperlipidemia    Hypokalemia    Hypothyroidism    Ischemic cardiomyopathy    a. Echo 6/17: EF 20-25%, apex appears akinetic, MAC, moderate MR, moderate LAE, mild RVE, trivial PI, PASP 47 mmHg (needs repeat with Definity contrast).  b. Limited echo with Definity contrast 7/17: EF 25-30%, moderate to severe LAE. c. Limited Echo 2018 showed EF 40-45%.   Lacunar infarction ([place]) 12/17/2012   Dr Krista Blue, Neurology    Leukocytosis    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   Longstanding persistent atrial fibrillation (HCC)    MI (myocardial infarction) ([place]) 1992   PAD (peripheral artery disease) (HCC)    Right SFA occlusion, severe disease left CFA and SFA   Prediabetes 07/28/2016   Stage 3 chronic kidney disease ([place])    Stroke ([place])    a. 02/2017 in setting of noncompliance with Eliquis   TIA (transient ischemic attack)    Tricuspid regurgitation     Past Surgical History:  Procedure Laterality Date   APPENDECTOMY     at hysterectomy and USO for fibroids, Dr. Ysidro Evert   CARDIAC CATHETERIZATION  1992   Dr Eustace Quail   CARDIAC CATHETERIZATION N/A 09/06/2015   Procedure: Right/Left Heart Cath and Coronary Angiography;  Surgeon: Burnell Blanks, MD;  Location: [facility];  Service: Cardiovascular;  Laterality: N/A;   CARDIAC CATHETERIZATION N/A 09/07/2015   Procedure: Coronary Stent Intervention;  Surgeon: Burnell Blanks, MD;  Location: [facility];  Service: Cardiovascular;  Laterality: N/A;   COLONOSCOPY     negative; 2008, Dr. Delfin Edis   fracture LLE     '94; pinned   IR ANGIO INTRA EXTRACRAN SEL COM CAROTID INNOMINATE BILAT MOD SED  03/02/2017   IR ANGIO INTRA EXTRACRAN SEL COM CAROTID INNOMINATE BILAT MOD SED  04/23/2018   IR ANGIO VERTEBRAL SEL SUBCLAVIAN INNOMINATE BILAT MOD SED  04/23/2018   IR ANGIO VERTEBRAL SEL VERTEBRAL UNI R MOD SED  03/02/2017   IR RADIOLOGIST  EVAL & MGMT  04/28/2017   IR US GUIDE VASC ACCESS RIGHT  04/23/2018   RADIOLOGY WITH ANESTHESIA N/A 03/02/2017   Procedure: RADIOLOGY WITH ANESTHESIA;  Surgeon: Luanne Bras, MD;  Location: [facility];  Service: Radiology;  Laterality: N/A;   TONSILLECTOMY     TOTAL ABDOMINAL HYSTERECTOMY     & BSO for fibroids    Current Medications: No outpatient medications have been marked as taking for the 08/29/20 encounter (Appointment) with Imogene Burn, PA-C.     Allergies:   Definity [perflutren lipid microsphere] and Cilostazol   Social History   Socioeconomic History   Marital status: Married    Spouse name: [name]   Number of children: 1   Years of education: college   Highest education level: Not on file  Occupational History   Occupation: Pharmacist, hospital    Comment: Retired  Tobacco Use   Smoking status: Former    Pack years: 0.00    Types: Cigarettes    Quit date: 03/11/1990    Years since quitting: 30.4   Smokeless tobacco: Never   Tobacco comments:    smoked 1973- 1992, up to < 5 cigarettes  Vaping Use   Vaping Use: Never used  Substance and Sexual Activity   Alcohol use: No   Drug use: No   Sexual activity: Not Currently    Birth control/protection: Surgical  Other Topics Concern   Not on file  Social History Narrative   She works as needed Oceanographer, 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 08/31/09- designated party form signed appointing, husband Lakara Weiland; ok to leave msg on home phone [phone].      Caffeine Small Amount one cup very rare.   Right handed.   One child- [name]   Social Determinants of Health   Financial Resource Strain: Not on file  Food Insecurity: Not on file  Transportation Needs: Not on file  Physical Activity: Not on file  Stress: Not on file  Social Connections: Not on file     Family History:  The patient's ***family history includes Coronary artery disease in her brother; Diabetes in her mother; Hypertension in her mother;  Stroke in her brother.   ROS:   Please see the history of present illness.    ROS All other systems reviewed and are negative.   PHYSICAL EXAM:   VS:  There were no vitals taken for this visit.  Physical Exam  GEN: Well nourished, well developed, in no acute distress  HEENT: normal  Neck: no JVD, carotid bruits, or masses Cardiac:RRR; no murmurs, rubs, or gallops  Respiratory:  clear to auscultation bilaterally, normal work of breathing GI: soft, nontender, nondistended, + BS Ext: without cyanosis, clubbing, or edema, Good distal pulses  bilaterally MS: no deformity or atrophy  Skin: warm and dry, no rash Neuro:  Alert and Oriented x 3, Strength and sensation are intact Psych: euthymic mood, full affect  Wt Readings from Last 3 Encounters:  06/09/20 167 lb 6.4 oz (75.9 kg)  06/07/20 175 lb 14.8 oz (79.8 kg)  03/28/20 176 lb (79.8 kg)      Studies/Labs Reviewed:   EKG:  EKG is*** ordered today.  The ekg ordered today demonstrates ***  Recent Labs: 03/28/2020: TSH 0.86 06/07/2020: ALT 16; BUN 27; Creatinine, Ser 1.31; Hemoglobin 12.1; Platelets 231; Potassium 3.7; Sodium 139   Lipid Panel    Component Value Date/Time   CHOL 117 03/28/2020 1552   CHOL 131 06/03/2017 0921   TRIG 60.0 03/28/2020 1552   HDL 39.30 03/28/2020 1552   HDL 40 06/03/2017 0921   CHOLHDL 3 03/28/2020 1552   VLDL 12.0 03/28/2020 1552   LDLCALC 65 03/28/2020 1552   LDLCALC 72 06/03/2017 0921    Additional studies/ records that were reviewed today include:  Echo 03/03/17: - Procedure narrative: Transthoracic echocardiography. Image   quality was suboptimal. The study was technically difficult, as a   result of poor sound wave transmission and restricted patient   mobility. Intravenous contrast (agitated saline) was   administered. - Left ventricle: There is a false tendon in the mid LV of no   clinical significance. LVF appears reduced but poor acoustical   windows prevent accurate assessment  of EF or wall motion. Tech   reported that patient has allergy to echo contrast. Bubble study   done but poor quality. Consider Cardiac MRI The cavity size was   normal. There was moderate concentric hypertrophy. Systolic   function was normal. Wall motion was normal; there were no   regional wall motion abnormalities. - Aortic valve: Trileaflet; mildly thickened, mildly calcified   leaflets. - Mitral valve: Valve area by continuity equation (using LVOT   flow): 0.86 cm^2. - Left atrium: The atrium was mildly dilated. - Pulmonary arteries: PA peak pressure: 32 mm Hg (S).               Risk Assessment/Calculations:   {Does this patient have ATRIAL FIBRILLATION?:[phone]}     ASSESSMENT:    No diagnosis found.   PLAN:  In order of problems listed above:  CAD status post DES to the ostial mid RCA, proximal circumflex, OM 2, first diagonal 08/2015 after she was felt not to be a candidate for CABG.  On aspirin, statin and beta-blocker-no angina.      Ischemic cardiomyopathy ejection fraction 45% on echo in 2018 on Lotensin Aldactone and Toprol   Acute on chronic systolic CHF patient had been out of Lasix for 4 months and I restarted last office visit-she has lost 11 pounds and doing a lot better.  Does not like to follow low-sodium diet but son is helping her.   Persistent atrial fibrillation on Eliquis and Toprol needs CBC and bmet today-ran out of Toprol last week and heart rate was running a little fast on exam.  Advised them to get filled and to be careful about not running out of meds.   History of CVA when not taking her Eliquis properly   Hypertension blood pressure controlled   Hyperlipidemia lipids checked by PCP 02/2019 and LDL was 66 on Lipitor        Shared Decision Making/Informed Consent   {Are you ordering a CV Procedure (e.g. stress test, cath, DCCV, TEE, etc)?  Press F2        :[id number]}    Medication Adjustments/Labs and Tests Ordered: Current  medicines are reviewed at length with the patient today.  Concerns regarding medicines are outlined above.  Medication changes, Labs and Tests ordered today are listed in the Patient Instructions below. There are no Patient Instructions on file for this visit.   Signed, Ermalinda Barrios, PA-C  08/28/2020 2:57 PM    [facility] [place], [place],   [address] Phone: [phone]; Fax: [phone]

## 2020-08-29 ENCOUNTER — Other Ambulatory Visit: Payer: Self-pay | Admitting: Physician Assistant

## 2020-08-29 ENCOUNTER — Other Ambulatory Visit: Payer: Self-pay

## 2020-08-29 ENCOUNTER — Encounter: Payer: Self-pay | Admitting: Internal Medicine

## 2020-08-29 ENCOUNTER — Ambulatory Visit (INDEPENDENT_AMBULATORY_CARE_PROVIDER_SITE_OTHER): Payer: Medicare HMO | Admitting: Internal Medicine

## 2020-08-29 ENCOUNTER — Ambulatory Visit: Payer: Medicare HMO | Admitting: Physician Assistant

## 2020-08-29 VITALS — BP 118/76 | HR 59 | Temp 97.6°F | Resp 18 | Ht 67.0 in | Wt 161.6 lb

## 2020-08-29 DIAGNOSIS — I69320 Aphasia following cerebral infarction: Secondary | ICD-10-CM

## 2020-08-29 DIAGNOSIS — R21 Rash and other nonspecific skin eruption: Secondary | ICD-10-CM | POA: Insufficient documentation

## 2020-08-29 MED ORDER — NYSTATIN-TRIAMCINOLONE 100000-0.1 UNIT/GM-% EX OINT: 1.0000 "application " | TOPICAL_OINTMENT | Freq: Two times a day (BID) | CUTANEOUS | 0 refills | Status: DC

## 2020-08-29 NOTE — Assessment & Plan Note (Signed)
Suspect fungal based on description. Rx nystatin/triamcinolone ointment. Once healed have recommended desitin or zinc oxide ointment to help prevent recurrence. Frequent changing of wet depends.

## 2020-08-29 NOTE — Progress Notes (Signed)
   Subjective:   Patient ID: Yvonne Bailey, female    DOB: 30-Apr-1940, 80 y.o.   MRN: [id number]  HPI The patient is an 80 YO female coming in for rash in the groin region. Started about a week ago. Goes to senior center during the day and doesn't change depends like she should when at home. There is a lot of tension between son and mother currently and he provides most history. She is not listening to him and not wanting to change out of soiled clothes. Not wanting to shower/bathe. She has swelling and he had to increase fluid pill to 40 mg recently which worsened the urinating problem. Denies fevers or chills. He is unsure if she is still able to live at home and he may want to explore other options.   Review of Systems  Unable to perform ROS: Patient nonverbal  Genitourinary:  Positive for enuresis.   Objective:  Physical Exam Constitutional:      Appearance: She is well-developed.  HENT:     Head: Normocephalic and atraumatic.  Cardiovascular:     Rate and Rhythm: Normal rate and regular rhythm.  Pulmonary:     Effort: Pulmonary effort is normal. No respiratory distress.     Breath sounds: Normal breath sounds. No wheezing or rales.  Abdominal:     General: Bowel sounds are normal. There is no distension.     Palpations: Abdomen is soft.     Tenderness: There is no abdominal tenderness. There is no rebound.  Musculoskeletal:     Cervical back: Normal range of motion.  Skin:    General: Skin is warm and dry.     Comments: Patient did not want examination of rash  Neurological:     Mental Status: She is alert.     Coordination: Coordination abnormal.     Comments: Not responsive during visit verbally, can follow commands when directed at her.     Vitals:   08/29/20 1052  BP: 118/76  Pulse: (!) 59  Resp: 18  Temp: 97.6 F (36.4 C)  TempSrc: Oral  SpO2: 99%  Weight: 161 lb 9.6 oz (73.3 kg)  Height: 5\' 7"  (1.702 m)    This visit occurred during the SARS-CoV-2  public health emergency.  Safety protocols were in place, including screening questions prior to the visit, additional usage of staff PPE, and extensive cleaning of exam room while observing appropriate contact time as indicated for disinfecting solutions.   Assessment & Plan:

## 2020-08-29 NOTE — Assessment & Plan Note (Signed)
Referral to home health with social worker and nursing for change in health status. Counseled son about past strokes impacting memory and the changes this can cause in people.

## 2020-08-29 NOTE — Patient Instructions (Signed)
We will have home health to come out to help with the rash.  We will have a Child psychotherapist contact you to see the best long term solution.

## 2020-08-30 ENCOUNTER — Other Ambulatory Visit: Payer: Self-pay | Admitting: Physician Assistant

## 2020-09-01 ENCOUNTER — Telehealth: Payer: Self-pay | Admitting: Internal Medicine

## 2020-09-01 NOTE — Telephone Encounter (Signed)
    Patients son calling for status of HH referral. Advised  HH to contact patient

## 2020-09-05 ENCOUNTER — Other Ambulatory Visit: Payer: Self-pay | Admitting: Internal Medicine

## 2020-09-06 ENCOUNTER — Telehealth: Payer: Self-pay | Admitting: Internal Medicine

## 2020-09-06 NOTE — Telephone Encounter (Signed)
Type of form received: [facility] for [facility]   Additional comments: Due by 09/12/2020  Received by: [place]   Form should be Faxed to: N/A  Form should be mailed to: Please email:   Email: [email or web address]   Is patient requesting call for pickup: Y   Form placed in the Provider's box.  *Attach charge sheet.  Provider will determine charge.*  Was patient informed of  7-10 business day turn around (Y/N)? Jeannie Fend

## 2020-09-07 NOTE — Telephone Encounter (Signed)
   Son calling to report [facility] does not have staff to take his mother as a patient. He reports her groin area looks worse since last visit on 6/21. He also has questions about home health and rehab   Please call

## 2020-09-08 ENCOUNTER — Encounter: Payer: Self-pay | Admitting: *Deleted

## 2020-09-08 ENCOUNTER — Other Ambulatory Visit: Payer: Self-pay

## 2020-09-08 ENCOUNTER — Emergency Department (HOSPITAL_COMMUNITY): Payer: Medicare HMO

## 2020-09-08 ENCOUNTER — Emergency Department (HOSPITAL_COMMUNITY)
Admission: EM | Admit: 2020-09-08 | Discharge: 2020-09-09 | Disposition: A | Discharge status: Home / Self Care | Payer: Medicare HMO | Attending: Emergency Medicine | Admitting: Emergency Medicine

## 2020-09-08 ENCOUNTER — Encounter (HOSPITAL_COMMUNITY): Payer: Self-pay

## 2020-09-08 DIAGNOSIS — E039 Hypothyroidism, unspecified: Secondary | ICD-10-CM | POA: Diagnosis not present

## 2020-09-08 DIAGNOSIS — R531 Weakness: Secondary | ICD-10-CM | POA: Diagnosis not present

## 2020-09-08 DIAGNOSIS — Z79899 Other long term (current) drug therapy: Secondary | ICD-10-CM | POA: Diagnosis not present

## 2020-09-08 DIAGNOSIS — I4891 Unspecified atrial fibrillation: Secondary | ICD-10-CM | POA: Diagnosis not present

## 2020-09-08 DIAGNOSIS — I130 Hypertensive heart and chronic kidney disease with heart failure and stage 1 through stage 4 chronic kidney disease, or unspecified chronic kidney disease: Secondary | ICD-10-CM | POA: Insufficient documentation

## 2020-09-08 DIAGNOSIS — I2510 Atherosclerotic heart disease of native coronary artery without angina pectoris: Secondary | ICD-10-CM | POA: Diagnosis not present

## 2020-09-08 DIAGNOSIS — I5022 Chronic systolic (congestive) heart failure: Secondary | ICD-10-CM | POA: Insufficient documentation

## 2020-09-08 DIAGNOSIS — R21 Rash and other nonspecific skin eruption: Secondary | ICD-10-CM | POA: Diagnosis not present

## 2020-09-08 DIAGNOSIS — Z7901 Long term (current) use of anticoagulants: Secondary | ICD-10-CM | POA: Diagnosis not present

## 2020-09-08 DIAGNOSIS — N183 Chronic kidney disease, stage 3 unspecified: Secondary | ICD-10-CM | POA: Diagnosis not present

## 2020-09-08 DIAGNOSIS — Z87891 Personal history of nicotine dependence: Secondary | ICD-10-CM | POA: Insufficient documentation

## 2020-09-08 DIAGNOSIS — I10 Essential (primary) hypertension: Secondary | ICD-10-CM | POA: Diagnosis not present

## 2020-09-08 DIAGNOSIS — I672 Cerebral atherosclerosis: Secondary | ICD-10-CM | POA: Diagnosis not present

## 2020-09-08 DIAGNOSIS — I6523 Occlusion and stenosis of bilateral carotid arteries: Secondary | ICD-10-CM | POA: Diagnosis not present

## 2020-09-08 DIAGNOSIS — R001 Bradycardia, unspecified: Secondary | ICD-10-CM | POA: Diagnosis not present

## 2020-09-08 DIAGNOSIS — I517 Cardiomegaly: Secondary | ICD-10-CM | POA: Diagnosis not present

## 2020-09-08 DIAGNOSIS — M852 Hyperostosis of skull: Secondary | ICD-10-CM | POA: Diagnosis not present

## 2020-09-08 DIAGNOSIS — Z955 Presence of coronary angioplasty implant and graft: Secondary | ICD-10-CM | POA: Diagnosis not present

## 2020-09-08 DIAGNOSIS — R0902 Hypoxemia: Secondary | ICD-10-CM | POA: Diagnosis not present

## 2020-09-08 DIAGNOSIS — Z8673 Personal history of transient ischemic attack (TIA), and cerebral infarction without residual deficits: Secondary | ICD-10-CM | POA: Diagnosis not present

## 2020-09-08 DIAGNOSIS — Z7982 Long term (current) use of aspirin: Secondary | ICD-10-CM | POA: Diagnosis not present

## 2020-09-08 LAB — CBC WITH DIFFERENTIAL/PLATELET
Abs Immature Granulocytes: 0.03 10*3/uL (ref 0.00–0.07)
Basophils Absolute: 0.1 10*3/uL (ref 0.0–0.1)
Basophils Relative: 1 %
Eosinophils Absolute: 0.3 10*3/uL (ref 0.0–0.5)
Eosinophils Relative: 3 %
HCT: 36.9 % (ref 36.0–46.0)
Hemoglobin: 11.8 g/dL — ABNORMAL LOW (ref 12.0–15.0)
Immature Granulocytes: 0 %
Lymphocytes Relative: 25 %
Lymphs Abs: 2.2 10*3/uL (ref 0.7–4.0)
MCH: 32.6 pg (ref 26.0–34.0)
MCHC: 32 g/dL (ref 30.0–36.0)
MCV: 101.9 fL — ABNORMAL HIGH (ref 80.0–100.0)
Monocytes Absolute: 0.9 10*3/uL (ref 0.1–1.0)
Monocytes Relative: 11 %
Neutro Abs: 5.2 10*3/uL (ref 1.7–7.7)
Neutrophils Relative %: 60 %
Platelets: 224 10*3/uL (ref 150–400)
RBC: 3.62 MIL/uL — ABNORMAL LOW (ref 3.87–5.11)
RDW: 12.8 % (ref 11.5–15.5)
WBC: 8.6 10*3/uL (ref 4.0–10.5)
nRBC: 0 % (ref 0.0–0.2)

## 2020-09-08 LAB — URINALYSIS, ROUTINE W REFLEX MICROSCOPIC
Bilirubin Urine: NEGATIVE
Glucose, UA: NEGATIVE mg/dL
Ketones, ur: NEGATIVE mg/dL
Leukocytes,Ua: NEGATIVE
Nitrite: NEGATIVE
Protein, ur: NEGATIVE mg/dL
RBC / HPF: >50 RBC/hpf — ABNORMAL HIGH (ref 0–5)
Specific Gravity, Urine: 1.017 (ref 1.005–1.030)
pH: 5 (ref 5.0–8.0)

## 2020-09-08 LAB — TROPONIN I (HIGH SENSITIVITY)
Troponin I (High Sensitivity): 11 ng/L (ref <18)
Troponin I (High Sensitivity): 12 ng/L (ref <18)

## 2020-09-08 LAB — COMPREHENSIVE METABOLIC PANEL
ALT: 15 U/L (ref 0–44)
AST: 19 U/L (ref 15–41)
Albumin: 3.5 g/dL (ref 3.5–5.0)
Alkaline Phosphatase: 84 U/L (ref 38–126)
Anion gap: 11 (ref 5–15)
BUN: 28 mg/dL — ABNORMAL HIGH (ref 8–23)
CO2: 24 mmol/L (ref 22–32)
Calcium: 9.2 mg/dL (ref 8.9–10.3)
Chloride: 104 mmol/L (ref 98–111)
Creatinine, Ser: 1.37 mg/dL — ABNORMAL HIGH (ref 0.44–1.00)
GFR, Estimated: 39 mL/min — ABNORMAL LOW (ref >60)
Glucose, Bld: 90 mg/dL (ref 70–99)
Potassium: 4.1 mmol/L (ref 3.5–5.1)
Sodium: 139 mmol/L (ref 135–145)
Total Bilirubin: 0.7 mg/dL (ref 0.3–1.2)
Total Protein: 6.3 g/dL — ABNORMAL LOW (ref 6.5–8.1)

## 2020-09-08 LAB — PROTIME-INR
INR: 1.6 — ABNORMAL HIGH (ref 0.8–1.2)
Prothrombin Time: 18.7 seconds — ABNORMAL HIGH (ref 11.4–15.2)

## 2020-09-08 LAB — MAGNESIUM: Magnesium: 1.9 mg/dL (ref 1.7–2.4)

## 2020-09-08 NOTE — Telephone Encounter (Signed)
   Patients son called and is requesting a referral to [facility].   Phone: [phone]

## 2020-09-08 NOTE — ED Triage Notes (Signed)
Family called EMS for what thought was a TIA, stroke scale was negative. She's had a stroke in 2019. C/o weakness otherwise feeling normal. EMS did state they noted a foul urine odor, suggesting a UTI.

## 2020-09-08 NOTE — ED Provider Notes (Signed)
[facility] EMERGENCY DEPARTMENT Provider Note   CSN: [id number] Arrival date & time: 09/08/20  1803     History Chief Complaint  Patient presents with   Weakness    Yvonne Bailey is a 80 y.o. female.  The history is provided by the patient.      Yvonne Bailey is a 80 y.o. female, with a history of dementia, DVT, HTN, hyperlipidemia, presenting to the ED with assessment due to generalized weakness.  EMS report was rather vague.  They state the family mentioned concerns ranging from possible TIA, generalized weakness, UTI.  The patient herself denies any current complaints other than "maybe some softer stools."  She denies fever/chills, recent falls, injuries, chest pain, shortness of breath, neck/back pain, urinary symptoms, abdominal pain, N/V, or any other complaints.  Past Medical History:  Diagnosis Date   Bilateral leg edema 07/19/2015   CAD (coronary artery disease)    a. anterior MI s/p PCI in 1992. b. cath 08/2015 with severe three-vessel CAD turned down for CABG and underwent DESx5 to prox Cx/OM2/D1/oRCA/mRCA   Carotid artery disease ([place])    a. carotid duplex 03/2015 showed 1-39% BICA, normal subclavian arteries, chronically occluded left vertebral, f/u recommended only PRN.   DVT (deep venous thrombosis) ([place])    X1   History of nuclear stress test    a. Myoview 6/17: EF 20-25%, mid anteroseptal, apical anterior, apical septal, apical inferior, apical lateral and apical scar, no ischemia, intermediate risk   HTN (hypertension)    Hyperlipidemia    Hypokalemia    Hypothyroidism    Ischemic cardiomyopathy    a. Echo 6/17: EF 20-25%, apex appears akinetic, MAC, moderate MR, moderate LAE, mild RVE, trivial PI, PASP 47 mmHg (needs repeat with Definity contrast).  b. Limited echo with Definity contrast 7/17: EF 25-30%, moderate to severe LAE. c. Limited Echo 2018 showed EF 40-45%.   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 02/2017 in setting of noncompliance with Eliquis   TIA (transient ischemic attack)    Tricuspid regurgitation     Patient Active Problem List   Diagnosis Date Noted   Rash 08/29/2020   TIA (transient ischemic attack) 06/10/2020   Anxiety and depression 03/28/2020   Overactive bladder 03/28/2020   Lack of motivation 20/80/2233   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   Aphasia as late effect of cerebrovascular accident (CVA)    Acute ischemic cerebrovascular accident (CVA) involving left middle cerebral artery territory [facility])    Coronary artery disease involving native coronary artery without angina pectoris    Stage 3 chronic kidney disease ([place])    Prediabetes 07/28/2016   DOE (dyspnea on exertion) 07/09/2016   Gait instability 07/09/2016   Cardiomyopathy, ischemic    Chronic systolic heart failure ([place]) 08/21/2015   Bilateral leg edema 07/19/2015   Atrial fibrillation ([place]) 07/19/2015   Urinary urgency 03/30/2015   Lacunar infarction ([place]) 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) 01/21/2007   Coronary atherosclerosis 10/29/2006    Past Surgical History:  Procedure Laterality Date   APPENDECTOMY     at hysterectomy and USO for fibroids, Dr. Ysidro Evert   CARDIAC CATHETERIZATION  1992   Dr Eustace Quail   CARDIAC CATHETERIZATION N/A 09/06/2015   Procedure:  Right/Left Heart Cath and Coronary Angiography;  Surgeon: Burnell Blanks, MD;  Location: [place] [facility];  Service: Cardiovascular;  Laterality: N/A;   CARDIAC CATHETERIZATION N/A 09/07/2015   Procedure: Coronary Stent Intervention;  Surgeon: Burnell Blanks, MD;  Location: [place] [facility];  Service: Cardiovascular;  Laterality: N/A;   COLONOSCOPY     negative; 2008, Dr. Delfin Edis   fracture LLE     '94; pinned   IR ANGIO INTRA EXTRACRAN SEL COM CAROTID INNOMINATE BILAT MOD SED  03/02/2017   IR ANGIO INTRA EXTRACRAN SEL COM CAROTID INNOMINATE BILAT MOD SED  04/23/2018   IR ANGIO VERTEBRAL SEL SUBCLAVIAN INNOMINATE BILAT MOD SED  04/23/2018   IR ANGIO VERTEBRAL SEL VERTEBRAL UNI R MOD SED  03/02/2017   IR RADIOLOGIST EVAL & MGMT  04/28/2017   IR US GUIDE VASC ACCESS RIGHT  04/23/2018   RADIOLOGY WITH ANESTHESIA N/A 03/02/2017   Procedure: RADIOLOGY WITH ANESTHESIA;  Surgeon: Luanne Bras, MD;  Location: [place];  Service: Radiology;  Laterality: N/A;   TONSILLECTOMY     TOTAL ABDOMINAL HYSTERECTOMY     & BSO for fibroids     OB History     Gravida  4   Para  1   Term  1   Preterm      AB  3   Living  1      SAB  3   IAB      Ectopic      Multiple      Live Births  1           Family History  Problem Relation Age of Onset   Diabetes Mother    Hypertension Mother    Stroke Brother        ?> 89   Coronary artery disease Brother        stent in 81s   Cancer Neg Hx     Social History   Tobacco Use   Smoking status: Former    Pack years: 0.00    Types: Cigarettes    Quit date: 03/11/1990    Years since quitting: 30.5   Smokeless tobacco: Never   Tobacco comments:    smoked 1973- 1992, up to < 5 cigarettes  Vaping Use   Vaping Use: Never used  Substance Use Topics   Alcohol use: No   Drug use: No    Home Medications Prior to Admission medications   Medication Sig Start Date End Date Taking? Authorizing Provider  amLODipine  (NORVASC) 2.5 MG tablet TAKE 1 TABLET(2.5 MG) BY MOUTH DAILY. FOLLOW-UP APPOINTMENT DUE IN DEC Patient taking differently: Take 2.5 mg by mouth daily. 05/08/20  Yes Burns, Claudina Lick, MD  apixaban (ELIQUIS) 5 MG TABS tablet Take 1 tablet (5 mg total) by mouth 2 (two) times daily. 07/27/20  Yes Burnell Blanks, MD  aspirin EC 81 MG tablet Take 1 tablet (81 mg total) by mouth daily. 04/21/17  Yes Imogene Burn, PA-C  atorvastatin (LIPITOR) 80 MG tablet TAKE 1 TABLET(80 MG) BY MOUTH DAILY 06/26/20  Yes Burns, Claudina Lick, MD  benazepril (LOTENSIN) 20 MG tablet TAKE 1 AND 1/2 TABLETS(30 MG) BY MOUTH DAILY Patient taking differently: Take 30 mg by mouth daily. 03/23/20  Yes Burns, Claudina Lick, MD  brimonidine (ALPHAGAN) 0.2 % ophthalmic solution Place 1 drop into both eyes 3 (three) times daily.  12/24/17  Yes [provider]  dorzolamide-timolol (COSOPT) 22.3-6.8 MG/ML ophthalmic solution Place 1 drop into both eyes 2 (two) times daily.    Yes [provider]  furosemide (LASIX) 20 MG tablet TAKE 1 TABLET(20 MG) BY MOUTH DAILY 08/30/20  Yes Imogene Burn, PA-C  levothyroxine (SYNTHROID) 88 MCG tablet TAKE 1 TABLET(88 MCG) BY MOUTH DAILY 08/04/20  Yes Burns, Claudina Lick, MD  metoprolol succinate (TOPROL-XL) 50 MG 24 hr tablet TAKE 1 TABLET(50 MG) BY MOUTH DAILY 08/09/20  Yes Burnell Blanks, MD  MYRBETRIQ 25 MG TB24 tablet TAKE 1 TABLET(25 MG) BY MOUTH DAILY 09/05/20  Yes Burns, Claudina Lick, MD  nitroGLYCERIN (NITROSTAT) 0.4 MG SL tablet Place 1 tablet (0.4 mg total) under the tongue every 5 (five) minutes as needed for chest pain. 09/08/15  Yes Arbutus Leas, NP  nystatin-triamcinolone ointment (MYCOLOG) Apply 1 application topically 2 (two) times daily. 08/29/20  Yes Hoyt Koch, MD  sertraline (ZOLOFT) 50 MG tablet Take 1 tablet (50 mg total) by mouth daily. 05/04/20  Yes Burns, Claudina Lick, MD  spironolactone (ALDACTONE) 25 MG tablet TAKE 1 TABLET(25 MG) BY MOUTH DAILY 08/04/20  Yes  Burns, Claudina Lick, MD  travoprost, benzalkonium, (TRAVATAN) 0.004 % ophthalmic solution Place 1 drop into both eyes at bedtime.   Yes [provider]  metoprolol succinate (TOPROL-XL) 50 MG 24 hr tablet TAKE 1 TABLET(50 MG) BY MOUTH DAILY 08/09/20   Burnell Blanks, MD    Allergies    Definity [perflutren lipid microsphere] and Cilostazol  Review of Systems   Review of Systems  Constitutional:  Negative for chills, diaphoresis and fever.  Respiratory:  Negative for shortness of breath.   Cardiovascular:  Negative for chest pain.  Gastrointestinal:  Negative for abdominal pain, blood in stool, nausea and vomiting.  Genitourinary:  Negative for dysuria, flank pain and frequency.  Neurological:  Negative for dizziness, syncope, numbness and headaches.  All other systems reviewed and are negative.  Physical Exam Updated Vital Signs BP (!) 151/73   Pulse 72   Temp 98.6 F (37 C) (Rectal)   Resp 12   Ht _0  (1.702 m)   Wt 74 kg   SpO2 100%   BMI 25.55 kg/m   Physical Exam Vitals and nursing note reviewed.  Constitutional:      General: She is not in acute distress.    Appearance: She is well-developed. She is not diaphoretic.  HENT:     Head: Normocephalic and atraumatic.     Mouth/Throat:     Mouth: Mucous membranes are moist.     Pharynx: Oropharynx is clear.  Eyes:     Conjunctiva/sclera: Conjunctivae normal.  Cardiovascular:     Rate and Rhythm: Normal rate and regular rhythm.     Pulses: Normal pulses.          Radial pulses are 2+ on the right side and 2+ on the left side.       Posterior tibial pulses are 2+ on the right side and 2+ on the left side.     Heart sounds: Normal heart sounds.     Comments: Tactile temperature in the extremities appropriate  and equal bilaterally. Pulmonary:     Effort: Pulmonary effort is normal. No respiratory distress.     Breath sounds: Normal breath sounds.  Abdominal:     Palpations: Abdomen is soft.      Tenderness: There is no abdominal tenderness. There is no guarding.  Musculoskeletal:     Cervical back: Neck supple.     Right lower leg: No edema.     Left lower leg: No edema.  Lymphadenopathy:     Cervical: No cervical adenopathy.  Skin:    General: Skin is warm and dry.     Comments: Excoriations along the left buttock, without ulcerations, open wounds. The entire sacral, buttock, perianal region was inspected with some mild erythema, but no lesions or breaks in the skin noted.  Neurological:     Mental Status: She is alert.     Comments: No noted acute cognitive deficit. Sensation grossly intact to light touch in the extremities.   Grip strengths equal bilaterally.   Strength 4/5 in all extremities.  Patient uses a walker at baseline. Coordination intact.  Cranial nerves III-XII grossly intact.  Handles oral secretions without noted difficulty.  No noted phonation or speech deficit. No facial droop.   Psychiatric:        Mood and Affect: Mood and affect normal.        Speech: Speech normal.        Behavior: Behavior normal.    ED Results / Procedures / Treatments   Labs (all labs ordered are listed, but only abnormal results are displayed) Labs Reviewed  COMPREHENSIVE METABOLIC PANEL - Abnormal; Notable for the following components:      Result Value   BUN 28 (*)    Creatinine, Ser 1.37 (*)    Total Protein 6.3 (*)    GFR, Estimated 39 (*)    All other components within normal limits  CBC WITH DIFFERENTIAL/PLATELET - Abnormal; Notable for the following components:   RBC 3.62 (*)    Hemoglobin 11.8 (*)    MCV 101.9 (*)    All other components within normal limits  URINALYSIS, ROUTINE W REFLEX MICROSCOPIC - Abnormal; Notable for the following components:   APPearance CLOUDY (*)    Hgb urine dipstick LARGE (*)    RBC / HPF >50 (*)    Bacteria, UA RARE (*)    All other components within normal limits  PROTIME-INR - Abnormal; Notable for the following components:    Prothrombin Time 18.7 (*)    INR 1.6 (*)    All other components within normal limits  URINE CULTURE  MAGNESIUM  TROPONIN I (HIGH SENSITIVITY)  TROPONIN I (HIGH SENSITIVITY)    EKG EKG Interpretation  Date/Time:  Friday September 08 2020 18:09:36 EDT Ventricular Rate:  68 PR Interval:    QRS Duration: 113 QT Interval:  454 QTC Calculation: 483 R Axis:   63 Text Interpretation: Atrial fibrillation Ventricular premature complex Anterolateral infarct, old Confirmed by Davonna Belling [phone]) on 09/08/2020 9:30:13 PM  Radiology DG Chest 2 View  Result Date: 09/08/2020 CLINICAL DATA:  Generalized weakness. EXAM: CHEST - 2 VIEW COMPARISON:  03/03/2017 FINDINGS: Chronic cardiomegaly. Coronary artery calcifications and stents. Aortic atherosclerosis with stable mediastinal contours. There is no pulmonary edema or pleural effusion. No focal airspace disease. No pneumothorax. Mild thoracic spondylosis. No acute osseous abnormalities are seen. IMPRESSION: Chronic cardiomegaly.  No acute abnormality. Aortic Atherosclerosis (ICD10-I70.0). Electronically Signed   By: Keith Rake M.D.   On: 09/08/2020 20:09  CT Head Wo Contrast  Result Date: 09/08/2020 CLINICAL DATA:  Transient ischemic attack.  Weakness. EXAM: CT HEAD WITHOUT CONTRAST TECHNIQUE: Contiguous axial images were obtained from the base of the skull through the vertex without intravenous contrast. COMPARISON:  06/07/2020 FINDINGS: Brain: Stable appearance of remote small cerebellar infarcts in addition to prior infarcts in the left parietal lobe, right basal ganglia, and anterior limb of the right internal capsule. Periventricular white matter and corona radiata hypodensities favor chronic ischemic microvascular white matter disease. Otherwise, the brainstem, cerebellum, cerebral peduncles, thalamus, basal ganglia, basilar cisterns, and ventricular system appear within normal limits. Mild prominence of the pituitary region, query small  Rathke's cleft cyst, chronically stable. No intracranial hemorrhage, significant mass lesion, or acute CVA. Vascular: There is atherosclerotic calcification of the cavernous carotid arteries bilaterally. Skull: Mild hyperostosis frontalis interna. Sinuses/Orbits: Unremarkable Other: Bilateral common carotid atherosclerotic calcification in the upper neck. IMPRESSION: 1. Remote cerebral and cerebellar infarcts without acute intracranial abnormality identified. 2. Periventricular white matter and corona radiata hypodensities favor chronic ischemic microvascular white matter disease. 3. 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 4. Atherosclerosis. Electronically Signed   By: Van Clines M.D.   On: 09/08/2020 19:25    Procedures Procedures   Medications Ordered in ED Medications - No data to display  ED Course  I have reviewed the triage vital signs and the nursing notes.  Pertinent labs & imaging results that were available during my care of the patient were reviewed by me and considered in my medical decision making (see chart for details).  Clinical Course as of 09/09/20 0121  Fri Sep 08, 2020  2147 Spoke with patient's son, Jenny Reichmann, via phone.  In general, it seems as though he has concerns about his mother's worsening dementia. She lives at home with [name]'s college-age daughter. He states they have been working with the patient's PCP to set up home health, but he was informed yesterday that the home health agency does not have enough personnel to offer help. They are having increasing difficulty getting the patient to agreed to bathe and change her brief.  Patient was assessed by her PCP a couple weeks ago for a rash on the buttocks due to her sitting in a wet brief.  She was prescribed an ointment. Overall, they have noticed worsening in her dementia over the last several weeks. He states her PCP has steps in place to  assess the patient for possible skilled nursing placement.  This assessment is supposed to take place next week. He states someone told him he should bring the patient to the hospital to get a Foley catheter placed and to have her cared for in the hospital until she can be placed in a facility or until home health is available. I told him that this is not the case. [SJ]    Clinical Course User Index [SJ] Rayshell Goecke, Maceo Pro   MDM Rules/Calculators/A&P                          Patient presents with vague complaints reported by EMS. She denies any current complaints here in the ED. In speaking with the patient's son over the phone, it seems as though he has a variety of concerns, none of which seem to be acute.  They do need to be addressed, however, I think they would be best addressed by a primary care provider rather than in  the emergency department. In the patient's discharge, I included discussion regarding use of barrier ointments, frequent brief changes, and reaching out to the PCP for resources to help with working with elderly patients and those with dementia.   Findings and plan of care discussed with attending physician, Delphina Cahill, MD.    Final Clinical Impression(s) / ED Diagnoses Final diagnoses:  Rash    Rx / DC Orders ED Discharge Orders     None        Layla Maw 09/09/20 0121    Davonna Belling, MD 09/13/20 1435

## 2020-09-08 NOTE — Progress Notes (Signed)
Inpatient Rehabilitation Admissions Coordinator   I contacted patient's son, [name], per his request. He states that his Mom has a history of a CVA and mini stroke, dementia, has new wounds and is not doing as she should with her adls. He is requesting admit to [facility] inpatient rehab center for she needs some rehabilitation. He also states that he is in the process of calling an ambulance to take her to the hospital . I explained that we do not admit patient's from home and that she likely needs SNF rehab if he is unable to care for her at this time. He is requesting short term rehab only. If patient is admitted to the hospital with an acute neurological deficit, we could assess her rehab needs then. He verbalized again that he felt he needed to get her to the hospital.  Ottie Glazier, RN, MSN Rehab Admissions Coordinator [phone] 09/08/2020 3:11 PM

## 2020-09-09 NOTE — Discharge Instructions (Addendum)
  Be sure to change the patient's brief frequently throughout the day to avoid sitting in a wet brief. Apply barrier ointment to avoid skin irritation. This can be any product marketed as diaper ointment. May continue to use the ointment prescribed by the primary care provider, as directed.  If placement in a skilled nursing facility or similar environment is thought to be necessary, this should be coordinated through the primary care provider.

## 2020-09-09 NOTE — ED Notes (Signed)
Patient verbalizes understanding of discharge instructions. Opportunity for questioning and answers were provided. Armband removed by staff, pt discharged from ED via wheelchair with son.  

## 2020-09-10 LAB — URINE CULTURE: Culture: NO GROWTH

## 2020-09-12 ENCOUNTER — Telehealth: Payer: Self-pay

## 2020-09-12 NOTE — Telephone Encounter (Signed)
Called and spoke with pt son 09/08/20 about pt forms be completed and ready for pick up. He verbalized understanding.

## 2020-09-12 NOTE — Telephone Encounter (Signed)
Can we please try sending it someplace else.

## 2020-09-12 NOTE — Telephone Encounter (Signed)
Looks like Dr Okey Dupre ordered a [facility] referral after she saw her - per son interim does not have the staff to see her --- is there someplace else we can send the referral?

## 2020-09-14 ENCOUNTER — Telehealth: Payer: Self-pay | Admitting: Internal Medicine

## 2020-09-14 NOTE — Telephone Encounter (Signed)
Called pt to schedule AWV with [facility] but no answer.

## 2020-09-18 ENCOUNTER — Telehealth: Payer: Self-pay | Admitting: Internal Medicine

## 2020-09-18 NOTE — Telephone Encounter (Signed)
Type of form received: Orders for [facility]  Additional comments:   Received by: Email   Form should be Faxed to: [phone]  Form should be mailed to: n/a  Is patient requesting call for pickup: n   Form placed in the Provider's box.  *Attach charge sheet.  Provider will determine charge.*  Was patient informed of  7-10 business day turn around (Y/N)? n

## 2020-09-19 NOTE — Telephone Encounter (Signed)
Form placed in Dr. Lawerance Bach folder awaiting signature.

## 2020-09-19 NOTE — Telephone Encounter (Signed)
Form and copy of med list faxed today.

## 2020-09-21 ENCOUNTER — Telehealth: Payer: Self-pay | Admitting: Internal Medicine

## 2020-09-21 DIAGNOSIS — R21 Rash and other nonspecific skin eruption: Secondary | ICD-10-CM | POA: Diagnosis not present

## 2020-09-21 DIAGNOSIS — Z7901 Long term (current) use of anticoagulants: Secondary | ICD-10-CM | POA: Diagnosis not present

## 2020-09-21 DIAGNOSIS — Z9181 History of falling: Secondary | ICD-10-CM | POA: Diagnosis not present

## 2020-09-21 DIAGNOSIS — I4891 Unspecified atrial fibrillation: Secondary | ICD-10-CM | POA: Diagnosis not present

## 2020-09-21 DIAGNOSIS — E119 Type 2 diabetes mellitus without complications: Secondary | ICD-10-CM | POA: Diagnosis not present

## 2020-09-21 DIAGNOSIS — F0390 Unspecified dementia without behavioral disturbance: Secondary | ICD-10-CM | POA: Diagnosis not present

## 2020-09-21 DIAGNOSIS — Z7982 Long term (current) use of aspirin: Secondary | ICD-10-CM | POA: Diagnosis not present

## 2020-09-21 DIAGNOSIS — I69320 Aphasia following cerebral infarction: Secondary | ICD-10-CM | POA: Diagnosis not present

## 2020-09-21 NOTE — Telephone Encounter (Signed)
Verbals given today. °

## 2020-09-21 NOTE — Telephone Encounter (Signed)
HH ORDERS   Caller Name: [facility] Agency Name: Randolm Idol Phone #: [phone] LVM  Service Requested: Nursing, OT eval and home health aide until social worker can see pt  (examples: OT/PT/Skilled Nursing/Social Work/Speech Therapy/Wound Care) Frequency of Visits: 1w2

## 2020-09-22 DIAGNOSIS — I4891 Unspecified atrial fibrillation: Secondary | ICD-10-CM | POA: Diagnosis not present

## 2020-09-22 DIAGNOSIS — Z9181 History of falling: Secondary | ICD-10-CM | POA: Diagnosis not present

## 2020-09-22 DIAGNOSIS — I69320 Aphasia following cerebral infarction: Secondary | ICD-10-CM | POA: Diagnosis not present

## 2020-09-22 DIAGNOSIS — Z7901 Long term (current) use of anticoagulants: Secondary | ICD-10-CM | POA: Diagnosis not present

## 2020-09-22 DIAGNOSIS — F0390 Unspecified dementia without behavioral disturbance: Secondary | ICD-10-CM | POA: Diagnosis not present

## 2020-09-22 DIAGNOSIS — R21 Rash and other nonspecific skin eruption: Secondary | ICD-10-CM | POA: Diagnosis not present

## 2020-09-22 DIAGNOSIS — E119 Type 2 diabetes mellitus without complications: Secondary | ICD-10-CM | POA: Diagnosis not present

## 2020-09-22 DIAGNOSIS — Z7982 Long term (current) use of aspirin: Secondary | ICD-10-CM | POA: Diagnosis not present

## 2020-09-22 NOTE — Telephone Encounter (Signed)
   HH ORDERS   Caller Name: [facility] Agency Name: Randolm Idol Phone #: [phone] Service Requested: PT EVAL, OT Frequency of Visits: OT [phone]

## 2020-09-24 ENCOUNTER — Other Ambulatory Visit: Payer: Self-pay | Admitting: Physician Assistant

## 2020-09-25 NOTE — Telephone Encounter (Signed)
Verbal given today 

## 2020-09-26 ENCOUNTER — Other Ambulatory Visit: Payer: Self-pay | Admitting: Physician Assistant

## 2020-09-27 DIAGNOSIS — Z7901 Long term (current) use of anticoagulants: Secondary | ICD-10-CM | POA: Diagnosis not present

## 2020-09-27 DIAGNOSIS — E119 Type 2 diabetes mellitus without complications: Secondary | ICD-10-CM | POA: Diagnosis not present

## 2020-09-27 DIAGNOSIS — Z9181 History of falling: Secondary | ICD-10-CM | POA: Diagnosis not present

## 2020-09-27 DIAGNOSIS — R21 Rash and other nonspecific skin eruption: Secondary | ICD-10-CM | POA: Diagnosis not present

## 2020-09-27 DIAGNOSIS — I4891 Unspecified atrial fibrillation: Secondary | ICD-10-CM | POA: Diagnosis not present

## 2020-09-27 DIAGNOSIS — I69320 Aphasia following cerebral infarction: Secondary | ICD-10-CM | POA: Diagnosis not present

## 2020-09-27 DIAGNOSIS — F0390 Unspecified dementia without behavioral disturbance: Secondary | ICD-10-CM | POA: Diagnosis not present

## 2020-09-27 DIAGNOSIS — Z7982 Long term (current) use of aspirin: Secondary | ICD-10-CM | POA: Diagnosis not present

## 2020-09-28 DIAGNOSIS — Z9181 History of falling: Secondary | ICD-10-CM | POA: Diagnosis not present

## 2020-09-28 DIAGNOSIS — I4891 Unspecified atrial fibrillation: Secondary | ICD-10-CM | POA: Diagnosis not present

## 2020-09-28 DIAGNOSIS — Z7982 Long term (current) use of aspirin: Secondary | ICD-10-CM | POA: Diagnosis not present

## 2020-09-28 DIAGNOSIS — F0390 Unspecified dementia without behavioral disturbance: Secondary | ICD-10-CM | POA: Diagnosis not present

## 2020-09-28 DIAGNOSIS — E119 Type 2 diabetes mellitus without complications: Secondary | ICD-10-CM | POA: Diagnosis not present

## 2020-09-28 DIAGNOSIS — Z7901 Long term (current) use of anticoagulants: Secondary | ICD-10-CM | POA: Diagnosis not present

## 2020-09-28 DIAGNOSIS — I69320 Aphasia following cerebral infarction: Secondary | ICD-10-CM | POA: Diagnosis not present

## 2020-09-28 DIAGNOSIS — R21 Rash and other nonspecific skin eruption: Secondary | ICD-10-CM | POA: Diagnosis not present

## 2020-09-29 DIAGNOSIS — Z7982 Long term (current) use of aspirin: Secondary | ICD-10-CM | POA: Diagnosis not present

## 2020-09-29 DIAGNOSIS — Z7901 Long term (current) use of anticoagulants: Secondary | ICD-10-CM | POA: Diagnosis not present

## 2020-09-29 DIAGNOSIS — I69320 Aphasia following cerebral infarction: Secondary | ICD-10-CM | POA: Diagnosis not present

## 2020-09-29 DIAGNOSIS — E119 Type 2 diabetes mellitus without complications: Secondary | ICD-10-CM | POA: Diagnosis not present

## 2020-09-29 DIAGNOSIS — F0390 Unspecified dementia without behavioral disturbance: Secondary | ICD-10-CM | POA: Diagnosis not present

## 2020-09-29 DIAGNOSIS — Z9181 History of falling: Secondary | ICD-10-CM | POA: Diagnosis not present

## 2020-09-29 DIAGNOSIS — R21 Rash and other nonspecific skin eruption: Secondary | ICD-10-CM | POA: Diagnosis not present

## 2020-09-29 DIAGNOSIS — I4891 Unspecified atrial fibrillation: Secondary | ICD-10-CM | POA: Diagnosis not present

## 2020-10-04 DIAGNOSIS — I4891 Unspecified atrial fibrillation: Secondary | ICD-10-CM | POA: Diagnosis not present

## 2020-10-04 DIAGNOSIS — R21 Rash and other nonspecific skin eruption: Secondary | ICD-10-CM | POA: Diagnosis not present

## 2020-10-04 DIAGNOSIS — Z7982 Long term (current) use of aspirin: Secondary | ICD-10-CM | POA: Diagnosis not present

## 2020-10-04 DIAGNOSIS — F0390 Unspecified dementia without behavioral disturbance: Secondary | ICD-10-CM | POA: Diagnosis not present

## 2020-10-04 DIAGNOSIS — Z7901 Long term (current) use of anticoagulants: Secondary | ICD-10-CM | POA: Diagnosis not present

## 2020-10-04 DIAGNOSIS — Z9181 History of falling: Secondary | ICD-10-CM | POA: Diagnosis not present

## 2020-10-04 DIAGNOSIS — E119 Type 2 diabetes mellitus without complications: Secondary | ICD-10-CM | POA: Diagnosis not present

## 2020-10-04 DIAGNOSIS — I69320 Aphasia following cerebral infarction: Secondary | ICD-10-CM | POA: Diagnosis not present

## 2020-10-06 DIAGNOSIS — E119 Type 2 diabetes mellitus without complications: Secondary | ICD-10-CM | POA: Diagnosis not present

## 2020-10-06 DIAGNOSIS — Z9181 History of falling: Secondary | ICD-10-CM | POA: Diagnosis not present

## 2020-10-06 DIAGNOSIS — I69320 Aphasia following cerebral infarction: Secondary | ICD-10-CM | POA: Diagnosis not present

## 2020-10-06 DIAGNOSIS — Z7901 Long term (current) use of anticoagulants: Secondary | ICD-10-CM | POA: Diagnosis not present

## 2020-10-06 DIAGNOSIS — I4891 Unspecified atrial fibrillation: Secondary | ICD-10-CM | POA: Diagnosis not present

## 2020-10-06 DIAGNOSIS — F0390 Unspecified dementia without behavioral disturbance: Secondary | ICD-10-CM | POA: Diagnosis not present

## 2020-10-06 DIAGNOSIS — Z7982 Long term (current) use of aspirin: Secondary | ICD-10-CM | POA: Diagnosis not present

## 2020-10-06 DIAGNOSIS — R21 Rash and other nonspecific skin eruption: Secondary | ICD-10-CM | POA: Diagnosis not present

## 2020-10-07 ENCOUNTER — Other Ambulatory Visit: Payer: Self-pay | Admitting: Internal Medicine

## 2020-10-12 DIAGNOSIS — Z7901 Long term (current) use of anticoagulants: Secondary | ICD-10-CM | POA: Diagnosis not present

## 2020-10-12 DIAGNOSIS — Z7982 Long term (current) use of aspirin: Secondary | ICD-10-CM | POA: Diagnosis not present

## 2020-10-12 DIAGNOSIS — I4891 Unspecified atrial fibrillation: Secondary | ICD-10-CM | POA: Diagnosis not present

## 2020-10-12 DIAGNOSIS — R21 Rash and other nonspecific skin eruption: Secondary | ICD-10-CM | POA: Diagnosis not present

## 2020-10-12 DIAGNOSIS — I69320 Aphasia following cerebral infarction: Secondary | ICD-10-CM | POA: Diagnosis not present

## 2020-10-12 DIAGNOSIS — E119 Type 2 diabetes mellitus without complications: Secondary | ICD-10-CM | POA: Diagnosis not present

## 2020-10-12 DIAGNOSIS — F0390 Unspecified dementia without behavioral disturbance: Secondary | ICD-10-CM | POA: Diagnosis not present

## 2020-10-12 DIAGNOSIS — Z9181 History of falling: Secondary | ICD-10-CM | POA: Diagnosis not present

## 2020-10-16 ENCOUNTER — Encounter: Payer: Self-pay | Admitting: Internal Medicine

## 2020-10-16 NOTE — Progress Notes (Signed)
Subjective:    Patient ID: Yvonne Bailey, female    DOB: 1940/12/20, 80 y.o.   MRN: [id number]  HPI The patient is here for an acute visit.  She is here with her son.  He provides most of the history because she is an extremely poor historian.  One week ago was a little disorientated.  EMS came and evaluated her.    She has someone coming in and changing her/bathing her a few times a week.  She does not bath and change herself on the weekend.  She has a rash in her groin, buttock region and he son feels it is related to not being clean and wet at times when she does not change her self.  She denies any urine symptoms.    Her son does feel she is confused x over one week.    She has missed an eliquis pill.  No obvious new weakness.     Medications and allergies reviewed with patient and updated if appropriate.  Patient Active Problem List   Diagnosis Date Noted   Rash 08/29/2020   TIA (transient ischemic attack) 06/10/2020   Anxiety and depression 03/28/2020   Overactive bladder 03/28/2020   Lack of motivation 34/28/7681   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   Aphasia as late effect of cerebrovascular accident (CVA)    Acute ischemic cerebrovascular accident (CVA) involving left middle cerebral artery [facility])    Coronary artery disease involving native coronary artery without angina pectoris    Stage 3 chronic kidney disease ([place])    Prediabetes 07/28/2016   DOE (dyspnea on exertion) 07/09/2016   Gait instability 07/09/2016   Cardiomyopathy, ischemic    Chronic systolic heart failure ([place]) 08/21/2015   Bilateral leg edema 07/19/2015   Atrial fibrillation ([place]) 07/19/2015   Urinary urgency 03/30/2015   Lacunar infarction ([place]) 12/17/2012   Small vessel disease, cerebrovascular 12/17/2012   CAROTID BRUIT, RIGHT 08/10/2008   Peripheral vascular disease ([place]) 06/04/2007   Diverticulosis of large intestine 06/04/2007   Hypothyroidism 02/24/2007   Dyslipidemia  02/24/2007   Essential hypertension 02/24/2007   Acute thromboembolism of deep veins of lower extremity ([place]) 01/21/2007   Coronary atherosclerosis 10/29/2006    Current Outpatient Medications on File Prior to Visit  Medication Sig Dispense Refill   amLODipine (NORVASC) 2.5 MG tablet TAKE 1 TABLET(2.5 MG) BY MOUTH DAILY. FOLLOW-UP 90 tablet 0   apixaban (ELIQUIS) 5 MG TABS tablet Take 1 tablet (5 mg total) by mouth 2 (two) times daily. 180 tablet 0   aspirin EC 81 MG tablet Take 1 tablet (81 mg total) by mouth daily. 90 tablet 3   atorvastatin (LIPITOR) 80 MG tablet TAKE 1 TABLET(80 MG) BY MOUTH DAILY 90 tablet 1   benazepril (LOTENSIN) 20 MG tablet TAKE 1 AND 1/2 TABLETS(30 MG) BY MOUTH DAILY (Patient taking differently: Take 30 mg by mouth daily.) 135 tablet 1   brimonidine (ALPHAGAN) 0.2 % ophthalmic solution Place 1 drop into both eyes 3 (three) times daily.      dorzolamide-timolol (COSOPT) 22.3-6.8 MG/ML ophthalmic solution Place 1 drop into both eyes 2 (two) times daily.      furosemide (LASIX) 20 MG tablet TAKE 1 TABLET(20 MG) BY MOUTH DAILY 15 tablet 0   levothyroxine (SYNTHROID) 88 MCG tablet TAKE 1 TABLET(88 MCG) BY MOUTH DAILY 90 tablet 1   metoprolol succinate (TOPROL-XL) 50 MG 24 hr tablet TAKE 1 TABLET(50 MG) BY MOUTH DAILY 90 tablet 0   MYRBETRIQ 25 MG  TB24 tablet TAKE 1 TABLET(25 MG) BY MOUTH DAILY 30 tablet 5   nitroGLYCERIN (NITROSTAT) 0.4 MG SL tablet Place 1 tablet (0.4 mg total) under the tongue every 5 (five) minutes as needed for chest pain. 25 tablet 1   nystatin-triamcinolone ointment (MYCOLOG) Apply 1 application topically 2 (two) times daily. 100 g 0   sertraline (ZOLOFT) 50 MG tablet Take 1 tablet (50 mg total) by mouth daily. 30 tablet 5   spironolactone (ALDACTONE) 25 MG tablet TAKE 1 TABLET(25 MG) BY MOUTH DAILY 90 tablet 1   travoprost, benzalkonium, (TRAVATAN) 0.004 % ophthalmic solution Place 1 drop into both eyes at bedtime.     No current  facility-administered medications on file prior to visit.    Past Medical History:  Diagnosis Date   Bilateral leg edema 07/19/2015   CAD (coronary artery disease)    a. anterior MI s/p PCI in 1992. b. cath 08/2015 with severe three-vessel CAD turned down for CABG and underwent DESx5 to prox Cx/OM2/D1/oRCA/mRCA   Carotid artery disease ([place])    a. carotid duplex 03/2015 showed 1-39% BICA, normal subclavian arteries, chronically occluded left vertebral, f/u recommended only PRN.   DVT (deep venous thrombosis) ([place])    X1   History of nuclear stress test    a. Myoview 6/17: EF 20-25%, mid anteroseptal, apical anterior, apical septal, apical inferior, apical lateral and apical scar, no ischemia, intermediate risk   HTN (hypertension)    Hyperlipidemia    Hypokalemia    Hypothyroidism    Ischemic cardiomyopathy    a. Echo 6/17: EF 20-25%, apex appears akinetic, MAC, moderate MR, moderate LAE, mild RVE, trivial PI, PASP 47 mmHg (needs repeat with Definity contrast).  b. Limited echo with Definity contrast 7/17: EF 25-30%, moderate to severe LAE. c. Limited Echo 2018 showed EF 40-45%.   Lacunar infarction ([place]) 12/17/2012   Dr Krista Blue, Neurology    Leukocytosis    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   Longstanding persistent atrial fibrillation ([facility])    MI (myocardial infarction) ([place]) 1992   PAD (peripheral artery disease) ([facility])    Right SFA occlusion, severe disease left CFA and SFA   Prediabetes 07/28/2016   Stage 3 chronic kidney disease ([place])    Stroke ([place])    a. 02/2017 in setting of noncompliance with Eliquis   TIA (transient ischemic attack)    Tricuspid regurgitation     Past Surgical History:  Procedure Laterality Date   APPENDECTOMY     at hysterectomy and USO for fibroids, Dr. Ysidro Evert   CARDIAC CATHETERIZATION  1992   Dr Eustace Quail   CARDIAC CATHETERIZATION N/A 09/06/2015   Procedure: Right/Left Heart Cath and Coronary Angiography;  Surgeon: Burnell Blanks, MD;   Location: [facility];  Service: Cardiovascular;  Laterality: N/A;   CARDIAC CATHETERIZATION N/A 09/07/2015   Procedure: Coronary Stent Intervention;  Surgeon: Burnell Blanks, MD;  Location: [facility];  Service: Cardiovascular;  Laterality: N/A;   COLONOSCOPY     negative; 2008, Dr. Delfin Edis   fracture LLE     '94; pinned   IR ANGIO INTRA EXTRACRAN SEL COM CAROTID INNOMINATE BILAT MOD SED  03/02/2017   IR ANGIO INTRA EXTRACRAN SEL COM CAROTID INNOMINATE BILAT MOD SED  04/23/2018   IR ANGIO VERTEBRAL SEL SUBCLAVIAN INNOMINATE BILAT MOD SED  04/23/2018   IR ANGIO VERTEBRAL SEL VERTEBRAL UNI R MOD SED  03/02/2017   IR RADIOLOGIST EVAL & MGMT  04/28/2017   IR US GUIDE  VASC ACCESS RIGHT  04/23/2018   RADIOLOGY WITH ANESTHESIA N/A 03/02/2017   Procedure: RADIOLOGY WITH ANESTHESIA;  Surgeon: Luanne Bras, MD;  Location: [facility];  Service: Radiology;  Laterality: N/A;   TONSILLECTOMY     TOTAL ABDOMINAL HYSTERECTOMY     & BSO for fibroids    Social History   Socioeconomic History   Marital status: Married    Spouse name: [name]   Number of children: 1   Years of education: college   Highest education level: Not on file  Occupational History   Occupation: Pharmacist, hospital    Comment: Retired  Tobacco Use   Smoking status: Former    Types: Cigarettes    Quit date: 03/11/1990    Years since quitting: 30.6   Smokeless tobacco: Never   Tobacco comments:    smoked 1973- 1992, up to < 5 cigarettes  Vaping Use   Vaping Use: Never used  Substance and Sexual Activity   Alcohol use: No   Drug use: No   Sexual activity: Not Currently    Birth control/protection: Surgical  Other Topics Concern   Not on file  Social History Narrative   She works as needed Oceanographer, 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 08/31/09- designated party form signed appointing, husband Mckensie Scotti; ok to leave msg on home phone [phone].      Caffeine Small Amount one cup very rare.   Right  handed.   One child- [name]   Social Determinants of Health   Financial Resource Strain: Not on file  Food Insecurity: Not on file  Transportation Needs: Not on file  Physical Activity: Not on file  Stress: Not on file  Social Connections: Not on file    Family History  Problem Relation Age of Onset   Diabetes Mother    Hypertension Mother    Stroke Brother        ?> 12   Coronary artery disease Brother        stent in 60s   Cancer Neg Hx     Review of Systems  Constitutional:  Negative for fever.  Respiratory:  Negative for cough, shortness of breath and wheezing.   Cardiovascular:  Negative for chest pain, palpitations and leg swelling.  Gastrointestinal:  Negative for abdominal pain, constipation, diarrhea and nausea.  Genitourinary:  Negative for dysuria, frequency and hematuria.  Skin:  Positive for rash (itching and skin changes in groin and butt region).  Neurological:  Negative for dizziness and headaches.       Objective:   Vitals:   10/17/20 0823  BP: 108/72  Pulse: 79  Temp: 98.7 F (37.1 C)  SpO2: 99%   BP Readings from Last 3 Encounters:  10/17/20 108/72  09/09/20 (!) 150/74  08/29/20 118/76   Wt Readings from Last 3 Encounters:  09/08/20 163 lb 2.3 oz (74 kg)  08/29/20 161 lb 9.6 oz (73.3 kg)  06/09/20 167 lb 6.4 oz (75.9 kg)   Body mass index is 25.55 kg/m.   Physical Exam    Constitutional: Appears well-developed and well-nourished. No distress.  Head: Normocephalic and atraumatic.  Neck: Neck supple. No tracheal deviation present. No thyromegaly present.  No cervical lymphadenopathy Cardiovascular: Normal rate, regular rhythm and normal heart sounds.  No murmur heard. No carotid bruit .  No edema Pulmonary/Chest: Effort normal and breath sounds normal. No respiratory distress. No has no wheezes. No rales.  Abdomen: soft, NT, ND Skin: Skin is warm and dry. Not diaphoretic.  Psychiatric:  Normal mood and affect. Behavior is normal.        Assessment & Plan:    See Problem List for Assessment and Plan of chronic medical problems.    This visit occurred during the SARS-CoV-2 public health emergency.  Safety protocols were in place, including screening questions prior to the visit, additional usage of staff PPE, and extensive cleaning of exam room while observing appropriate contact time as indicated for disinfecting solutions.

## 2020-10-17 ENCOUNTER — Ambulatory Visit (INDEPENDENT_AMBULATORY_CARE_PROVIDER_SITE_OTHER): Payer: Medicare HMO | Admitting: Internal Medicine

## 2020-10-17 ENCOUNTER — Other Ambulatory Visit: Payer: Self-pay

## 2020-10-17 ENCOUNTER — Ambulatory Visit (INDEPENDENT_AMBULATORY_CARE_PROVIDER_SITE_OTHER): Payer: Medicare HMO

## 2020-10-17 VITALS — BP 108/72 | HR 79 | Temp 98.7°F | Ht 67.0 in

## 2020-10-17 DIAGNOSIS — N3001 Acute cystitis with hematuria: Secondary | ICD-10-CM | POA: Insufficient documentation

## 2020-10-17 DIAGNOSIS — R410 Disorientation, unspecified: Secondary | ICD-10-CM | POA: Diagnosis not present

## 2020-10-17 DIAGNOSIS — R944 Abnormal results of kidney function studies: Secondary | ICD-10-CM | POA: Diagnosis not present

## 2020-10-17 DIAGNOSIS — I517 Cardiomegaly: Secondary | ICD-10-CM | POA: Diagnosis not present

## 2020-10-17 DIAGNOSIS — N3000 Acute cystitis without hematuria: Secondary | ICD-10-CM | POA: Insufficient documentation

## 2020-10-17 LAB — URINALYSIS, ROUTINE W REFLEX MICROSCOPIC
Bilirubin Urine: NEGATIVE
Ketones, ur: NEGATIVE
Nitrite: NEGATIVE
Specific Gravity, Urine: 1.02 (ref 1.000–1.030)
Total Protein, Urine: 30 — AB
Urine Glucose: NEGATIVE
Urobilinogen, UA: 0.2 (ref 0.0–1.0)
pH: 6 (ref 5.0–8.0)

## 2020-10-17 LAB — CBC WITH DIFFERENTIAL/PLATELET
Basophils Absolute: 0.1 10*3/uL (ref 0.0–0.1)
Basophils Relative: 0.6 % (ref 0.0–3.0)
Eosinophils Absolute: 0.3 10*3/uL (ref 0.0–0.7)
Eosinophils Relative: 2.1 % (ref 0.0–5.0)
HCT: 38 % (ref 36.0–46.0)
Hemoglobin: 12.4 g/dL (ref 12.0–15.0)
Lymphocytes Relative: 22.7 % (ref 12.0–46.0)
Lymphs Abs: 2.7 10*3/uL (ref 0.7–4.0)
MCHC: 32.7 g/dL (ref 30.0–36.0)
MCV: 97.9 fl (ref 78.0–100.0)
Monocytes Absolute: 1 10*3/uL (ref 0.1–1.0)
Monocytes Relative: 8.4 % (ref 3.0–12.0)
Neutro Abs: 7.9 10*3/uL — ABNORMAL HIGH (ref 1.4–7.7)
Neutrophils Relative %: 66.2 % (ref 43.0–77.0)
Platelets: 240 10*3/uL (ref 150.0–400.0)
RBC: 3.89 Mil/uL (ref 3.87–5.11)
RDW: 13.7 % (ref 11.5–15.5)
WBC: 12 10*3/uL — ABNORMAL HIGH (ref 4.0–10.5)

## 2020-10-17 LAB — COMPREHENSIVE METABOLIC PANEL
ALT: 14 U/L (ref 0–35)
AST: 19 U/L (ref 0–37)
Albumin: 3.9 g/dL (ref 3.5–5.2)
Alkaline Phosphatase: 80 U/L (ref 39–117)
BUN: 49 mg/dL — ABNORMAL HIGH (ref 6–23)
CO2: 25 mEq/L (ref 19–32)
Calcium: 9.3 mg/dL (ref 8.4–10.5)
Chloride: 102 mEq/L (ref 96–112)
Creatinine, Ser: 2.15 mg/dL — ABNORMAL HIGH (ref 0.40–1.20)
GFR: 21.27 mL/min — ABNORMAL LOW (ref >60.00)
Glucose, Bld: 100 mg/dL — ABNORMAL HIGH (ref 70–99)
Potassium: 4.5 mEq/L (ref 3.5–5.1)
Sodium: 137 mEq/L (ref 135–145)
Total Bilirubin: 0.8 mg/dL (ref 0.2–1.2)
Total Protein: 6.9 g/dL (ref 6.0–8.3)

## 2020-10-17 MED ORDER — CEPHALEXIN 500 MG PO CAPS: 500.0000 mg | ORAL_CAPSULE | Freq: Two times a day (BID) | ORAL | 0 refills | Status: DC

## 2020-10-17 NOTE — Assessment & Plan Note (Signed)
Acute Urinalysis confirms UTI-this could be the cause of some of her confusion Start Keflex 500 mg twice daily x7 days

## 2020-10-17 NOTE — Assessment & Plan Note (Addendum)
Acute Her son feels that she has been confused for a while, but it is worse over the past week or so EMS came out to their house last week and evaluated her She has no complaints-he wonders about a urinary tract infection She does not change her self on a regular basis and also has some incontinence No obvious neurological deficits, but has missed some Eliquis Possible UTI, possible stroke She does have some memory issues, but has never been officially diagnosed with dementia and that could be partially to blame for some of the confusion, electrolyte derangement Check CBC, CMP, UA, urine culture and CT of the head

## 2020-10-17 NOTE — Patient Instructions (Addendum)
   Blood work, urine and a chest xray was ordered.     A CT of the head was ordered.    Medications changes include :   none

## 2020-10-17 NOTE — Assessment & Plan Note (Signed)
Acute Worsening of chronic kidney dysfunction Likely related to UTI-does look dehydrated Treat UTI Recheck BMP next week May need to adjust medication

## 2020-10-18 ENCOUNTER — Telehealth: Payer: Self-pay

## 2020-10-18 ENCOUNTER — Inpatient Hospital Stay: Admission: RE | Admit: 2020-10-18 | Payer: Medicare HMO | Source: Ambulatory Visit

## 2020-10-18 DIAGNOSIS — Z7901 Long term (current) use of anticoagulants: Secondary | ICD-10-CM | POA: Diagnosis not present

## 2020-10-18 DIAGNOSIS — Z7982 Long term (current) use of aspirin: Secondary | ICD-10-CM | POA: Diagnosis not present

## 2020-10-18 DIAGNOSIS — F0390 Unspecified dementia without behavioral disturbance: Secondary | ICD-10-CM | POA: Diagnosis not present

## 2020-10-18 DIAGNOSIS — I69320 Aphasia following cerebral infarction: Secondary | ICD-10-CM | POA: Diagnosis not present

## 2020-10-18 DIAGNOSIS — I4891 Unspecified atrial fibrillation: Secondary | ICD-10-CM | POA: Diagnosis not present

## 2020-10-18 DIAGNOSIS — R21 Rash and other nonspecific skin eruption: Secondary | ICD-10-CM | POA: Diagnosis not present

## 2020-10-18 DIAGNOSIS — E119 Type 2 diabetes mellitus without complications: Secondary | ICD-10-CM | POA: Diagnosis not present

## 2020-10-18 DIAGNOSIS — Z9181 History of falling: Secondary | ICD-10-CM | POA: Diagnosis not present

## 2020-10-18 LAB — URINE CULTURE

## 2020-10-18 NOTE — Telephone Encounter (Signed)
ok 

## 2020-10-24 ENCOUNTER — Other Ambulatory Visit: Payer: Medicare HMO

## 2020-10-29 ENCOUNTER — Emergency Department (HOSPITAL_COMMUNITY)
Admission: EM | Admit: 2020-10-29 | Discharge: 2020-10-30 | Disposition: A | Discharge status: Home / Self Care | Payer: Medicare HMO | Source: Home / Self Care | Attending: Emergency Medicine | Admitting: Emergency Medicine

## 2020-10-29 ENCOUNTER — Other Ambulatory Visit: Payer: Self-pay

## 2020-10-29 ENCOUNTER — Encounter (HOSPITAL_COMMUNITY): Payer: Self-pay | Admitting: *Deleted

## 2020-10-29 DIAGNOSIS — R7881 Bacteremia: Secondary | ICD-10-CM | POA: Diagnosis present

## 2020-10-29 DIAGNOSIS — Z79899 Other long term (current) drug therapy: Secondary | ICD-10-CM | POA: Insufficient documentation

## 2020-10-29 DIAGNOSIS — K5641 Fecal impaction: Secondary | ICD-10-CM | POA: Diagnosis present

## 2020-10-29 DIAGNOSIS — I255 Ischemic cardiomyopathy: Secondary | ICD-10-CM | POA: Diagnosis present

## 2020-10-29 DIAGNOSIS — K5900 Constipation, unspecified: Secondary | ICD-10-CM | POA: Insufficient documentation

## 2020-10-29 DIAGNOSIS — I4819 Other persistent atrial fibrillation: Secondary | ICD-10-CM | POA: Diagnosis not present

## 2020-10-29 DIAGNOSIS — I130 Hypertensive heart and chronic kidney disease with heart failure and stage 1 through stage 4 chronic kidney disease, or unspecified chronic kidney disease: Secondary | ICD-10-CM | POA: Diagnosis present

## 2020-10-29 DIAGNOSIS — I2510 Atherosclerotic heart disease of native coronary artery without angina pectoris: Secondary | ICD-10-CM | POA: Insufficient documentation

## 2020-10-29 DIAGNOSIS — I083 Combined rheumatic disorders of mitral, aortic and tricuspid valves: Secondary | ICD-10-CM | POA: Diagnosis not present

## 2020-10-29 DIAGNOSIS — B9620 Unspecified Escherichia coli [E. coli] as the cause of diseases classified elsewhere: Secondary | ICD-10-CM | POA: Diagnosis present

## 2020-10-29 DIAGNOSIS — E1122 Type 2 diabetes mellitus with diabetic chronic kidney disease: Secondary | ICD-10-CM | POA: Insufficient documentation

## 2020-10-29 DIAGNOSIS — E039 Hypothyroidism, unspecified: Secondary | ICD-10-CM | POA: Insufficient documentation

## 2020-10-29 DIAGNOSIS — N183 Chronic kidney disease, stage 3 unspecified: Secondary | ICD-10-CM | POA: Insufficient documentation

## 2020-10-29 DIAGNOSIS — N390 Urinary tract infection, site not specified: Secondary | ICD-10-CM

## 2020-10-29 DIAGNOSIS — B964 Proteus (mirabilis) (morganii) as the cause of diseases classified elsewhere: Secondary | ICD-10-CM | POA: Diagnosis present

## 2020-10-29 DIAGNOSIS — Z87891 Personal history of nicotine dependence: Secondary | ICD-10-CM | POA: Insufficient documentation

## 2020-10-29 DIAGNOSIS — I351 Nonrheumatic aortic (valve) insufficiency: Secondary | ICD-10-CM | POA: Diagnosis not present

## 2020-10-29 DIAGNOSIS — N179 Acute kidney failure, unspecified: Secondary | ICD-10-CM | POA: Diagnosis present

## 2020-10-29 DIAGNOSIS — Z7982 Long term (current) use of aspirin: Secondary | ICD-10-CM | POA: Insufficient documentation

## 2020-10-29 DIAGNOSIS — I10 Essential (primary) hypertension: Secondary | ICD-10-CM | POA: Diagnosis not present

## 2020-10-29 DIAGNOSIS — I714 Abdominal aortic aneurysm, without rupture: Secondary | ICD-10-CM | POA: Diagnosis present

## 2020-10-29 DIAGNOSIS — N3001 Acute cystitis with hematuria: Secondary | ICD-10-CM | POA: Diagnosis not present

## 2020-10-29 DIAGNOSIS — R109 Unspecified abdominal pain: Secondary | ICD-10-CM | POA: Diagnosis not present

## 2020-10-29 DIAGNOSIS — I129 Hypertensive chronic kidney disease with stage 1 through stage 4 chronic kidney disease, or unspecified chronic kidney disease: Secondary | ICD-10-CM | POA: Insufficient documentation

## 2020-10-29 DIAGNOSIS — Z955 Presence of coronary angioplasty implant and graft: Secondary | ICD-10-CM | POA: Diagnosis not present

## 2020-10-29 DIAGNOSIS — Z7901 Long term (current) use of anticoagulants: Secondary | ICD-10-CM | POA: Insufficient documentation

## 2020-10-29 DIAGNOSIS — I5022 Chronic systolic (congestive) heart failure: Secondary | ICD-10-CM | POA: Diagnosis present

## 2020-10-29 DIAGNOSIS — I252 Old myocardial infarction: Secondary | ICD-10-CM | POA: Diagnosis not present

## 2020-10-29 DIAGNOSIS — N3000 Acute cystitis without hematuria: Secondary | ICD-10-CM | POA: Diagnosis present

## 2020-10-29 DIAGNOSIS — I480 Paroxysmal atrial fibrillation: Secondary | ICD-10-CM | POA: Diagnosis present

## 2020-10-29 DIAGNOSIS — Z8673 Personal history of transient ischemic attack (TIA), and cerebral infarction without residual deficits: Secondary | ICD-10-CM | POA: Diagnosis not present

## 2020-10-29 DIAGNOSIS — N1831 Chronic kidney disease, stage 3a: Secondary | ICD-10-CM | POA: Diagnosis not present

## 2020-10-29 DIAGNOSIS — F32A Depression, unspecified: Secondary | ICD-10-CM | POA: Diagnosis present

## 2020-10-29 DIAGNOSIS — Z951 Presence of aortocoronary bypass graft: Secondary | ICD-10-CM | POA: Diagnosis not present

## 2020-10-29 DIAGNOSIS — B952 Enterococcus as the cause of diseases classified elsewhere: Secondary | ICD-10-CM | POA: Diagnosis present

## 2020-10-29 DIAGNOSIS — F0390 Unspecified dementia without behavioral disturbance: Secondary | ICD-10-CM | POA: Diagnosis present

## 2020-10-29 DIAGNOSIS — Z20822 Contact with and (suspected) exposure to covid-19: Secondary | ICD-10-CM | POA: Diagnosis present

## 2020-10-29 DIAGNOSIS — N1832 Chronic kidney disease, stage 3b: Secondary | ICD-10-CM | POA: Diagnosis present

## 2020-10-29 NOTE — ED Triage Notes (Signed)
Pt to ED c/o lower abdominal pain that radiates to back.  Yetta Barre urinary s/s b/c abd pain./

## 2020-10-29 NOTE — ED Provider Notes (Addendum)
Emergency Medicine Provider Triage Evaluation Note  Yvonne Bailey , a 80 y.o. female  was evaluated in triage.  Pt complains of AP x 2 days. Diffuse, radiates to back. No fever, nausea, dysuria, hematuria. Level 5 caveat.   H/O of dementia, seen one week ago for increasing confusion but no UTI found.   Review of Systems  Positive: AP, back pain Negative: Above  Physical Exam  There were no vitals taken for this visit. Gen:   Awake, no distress   Resp:  Normal effort  MSK:   Moves extremities without difficulty  Other:  Diffuse tenderness to abdomen, no CVAT  Medical Decision Making  Medically screening exam initiated at 11:19 PM.  Appropriate orders placed.  JOELLE ROSWELL was informed that the remainder of the evaluation will be completed by another provider, this initial triage assessment does not replace that evaluation, and the importance of remaining in the ED until their evaluation is complete.  Temp 99, likely febrile. Will need rectal temp. Not tachycardic during vitals. Likely septic however - LA and blood cultures ordered. Should be next back.    Theron Arista, PA-C 10/29/20 2321    Theron Arista, PA-C 10/29/20 2338    Wynetta Fines, MD 11/01/20 1452

## 2020-10-30 ENCOUNTER — Encounter (HOSPITAL_COMMUNITY): Payer: Self-pay

## 2020-10-30 ENCOUNTER — Other Ambulatory Visit: Payer: Self-pay

## 2020-10-30 ENCOUNTER — Emergency Department (HOSPITAL_COMMUNITY): Payer: Medicare HMO

## 2020-10-30 ENCOUNTER — Inpatient Hospital Stay (HOSPITAL_COMMUNITY)
Admission: EM | Admit: 2020-10-30 | Discharge: 2020-11-03 | DRG: 872 | Disposition: A | Discharge status: Home Health | Payer: Medicare HMO | Attending: Internal Medicine | Admitting: Internal Medicine

## 2020-10-30 ENCOUNTER — Telehealth (HOSPITAL_BASED_OUTPATIENT_CLINIC_OR_DEPARTMENT_OTHER): Payer: Self-pay | Admitting: *Deleted

## 2020-10-30 DIAGNOSIS — I130 Hypertensive heart and chronic kidney disease with heart failure and stage 1 through stage 4 chronic kidney disease, or unspecified chronic kidney disease: Secondary | ICD-10-CM | POA: Diagnosis present

## 2020-10-30 DIAGNOSIS — E785 Hyperlipidemia, unspecified: Secondary | ICD-10-CM | POA: Diagnosis present

## 2020-10-30 DIAGNOSIS — F32A Depression, unspecified: Secondary | ICD-10-CM | POA: Diagnosis present

## 2020-10-30 DIAGNOSIS — K5641 Fecal impaction: Secondary | ICD-10-CM | POA: Diagnosis present

## 2020-10-30 DIAGNOSIS — Z20822 Contact with and (suspected) exposure to covid-19: Secondary | ICD-10-CM | POA: Diagnosis present

## 2020-10-30 DIAGNOSIS — I10 Essential (primary) hypertension: Secondary | ICD-10-CM | POA: Diagnosis present

## 2020-10-30 DIAGNOSIS — Z951 Presence of aortocoronary bypass graft: Secondary | ICD-10-CM

## 2020-10-30 DIAGNOSIS — Z87891 Personal history of nicotine dependence: Secondary | ICD-10-CM

## 2020-10-30 DIAGNOSIS — Z79890 Hormone replacement therapy (postmenopausal): Secondary | ICD-10-CM

## 2020-10-30 DIAGNOSIS — Z7901 Long term (current) use of anticoagulants: Secondary | ICD-10-CM

## 2020-10-30 DIAGNOSIS — N189 Chronic kidney disease, unspecified: Secondary | ICD-10-CM | POA: Diagnosis present

## 2020-10-30 DIAGNOSIS — I4891 Unspecified atrial fibrillation: Secondary | ICD-10-CM | POA: Diagnosis present

## 2020-10-30 DIAGNOSIS — I5022 Chronic systolic (congestive) heart failure: Secondary | ICD-10-CM | POA: Diagnosis present

## 2020-10-30 DIAGNOSIS — Z8249 Family history of ischemic heart disease and other diseases of the circulatory system: Secondary | ICD-10-CM

## 2020-10-30 DIAGNOSIS — N3000 Acute cystitis without hematuria: Secondary | ICD-10-CM | POA: Diagnosis present

## 2020-10-30 DIAGNOSIS — N390 Urinary tract infection, site not specified: Secondary | ICD-10-CM | POA: Diagnosis not present

## 2020-10-30 DIAGNOSIS — N1832 Chronic kidney disease, stage 3b: Secondary | ICD-10-CM | POA: Diagnosis present

## 2020-10-30 DIAGNOSIS — I255 Ischemic cardiomyopathy: Secondary | ICD-10-CM | POA: Diagnosis present

## 2020-10-30 DIAGNOSIS — I2510 Atherosclerotic heart disease of native coronary artery without angina pectoris: Secondary | ICD-10-CM | POA: Diagnosis present

## 2020-10-30 DIAGNOSIS — Z7982 Long term (current) use of aspirin: Secondary | ICD-10-CM

## 2020-10-30 DIAGNOSIS — N183 Chronic kidney disease, stage 3 unspecified: Secondary | ICD-10-CM | POA: Diagnosis present

## 2020-10-30 DIAGNOSIS — N3001 Acute cystitis with hematuria: Secondary | ICD-10-CM | POA: Diagnosis present

## 2020-10-30 DIAGNOSIS — E039 Hypothyroidism, unspecified: Secondary | ICD-10-CM | POA: Diagnosis present

## 2020-10-30 DIAGNOSIS — K5900 Constipation, unspecified: Secondary | ICD-10-CM | POA: Diagnosis not present

## 2020-10-30 DIAGNOSIS — Z955 Presence of coronary angioplasty implant and graft: Secondary | ICD-10-CM

## 2020-10-30 DIAGNOSIS — I252 Old myocardial infarction: Secondary | ICD-10-CM

## 2020-10-30 DIAGNOSIS — B952 Enterococcus as the cause of diseases classified elsewhere: Secondary | ICD-10-CM | POA: Diagnosis present

## 2020-10-30 DIAGNOSIS — B964 Proteus (mirabilis) (morganii) as the cause of diseases classified elsewhere: Secondary | ICD-10-CM | POA: Diagnosis present

## 2020-10-30 DIAGNOSIS — F419 Anxiety disorder, unspecified: Secondary | ICD-10-CM | POA: Diagnosis present

## 2020-10-30 DIAGNOSIS — I714 Abdominal aortic aneurysm, without rupture: Secondary | ICD-10-CM | POA: Diagnosis present

## 2020-10-30 DIAGNOSIS — Z79899 Other long term (current) drug therapy: Secondary | ICD-10-CM

## 2020-10-30 DIAGNOSIS — I480 Paroxysmal atrial fibrillation: Secondary | ICD-10-CM | POA: Diagnosis present

## 2020-10-30 DIAGNOSIS — F0390 Unspecified dementia without behavioral disturbance: Secondary | ICD-10-CM | POA: Diagnosis present

## 2020-10-30 DIAGNOSIS — Z8673 Personal history of transient ischemic attack (TIA), and cerebral infarction without residual deficits: Secondary | ICD-10-CM

## 2020-10-30 DIAGNOSIS — Z823 Family history of stroke: Secondary | ICD-10-CM

## 2020-10-30 DIAGNOSIS — R7881 Bacteremia: Principal | ICD-10-CM | POA: Diagnosis present

## 2020-10-30 DIAGNOSIS — B9620 Unspecified Escherichia coli [E. coli] as the cause of diseases classified elsewhere: Secondary | ICD-10-CM | POA: Diagnosis present

## 2020-10-30 DIAGNOSIS — R109 Unspecified abdominal pain: Secondary | ICD-10-CM | POA: Diagnosis not present

## 2020-10-30 DIAGNOSIS — Z86718 Personal history of other venous thrombosis and embolism: Secondary | ICD-10-CM

## 2020-10-30 DIAGNOSIS — Z833 Family history of diabetes mellitus: Secondary | ICD-10-CM

## 2020-10-30 DIAGNOSIS — N179 Acute kidney failure, unspecified: Secondary | ICD-10-CM | POA: Diagnosis present

## 2020-10-30 LAB — BASIC METABOLIC PANEL
Anion gap: 12 (ref 5–15)
BUN: 36 mg/dL — ABNORMAL HIGH (ref 8–23)
CO2: 26 mmol/L (ref 22–32)
Calcium: 9.5 mg/dL (ref 8.9–10.3)
Chloride: 101 mmol/L (ref 98–111)
Creatinine, Ser: 1.72 mg/dL — ABNORMAL HIGH (ref 0.44–1.00)
GFR, Estimated: 30 mL/min — ABNORMAL LOW (ref >60)
Glucose, Bld: 129 mg/dL — ABNORMAL HIGH (ref 70–99)
Potassium: 4.6 mmol/L (ref 3.5–5.1)
Sodium: 139 mmol/L (ref 135–145)

## 2020-10-30 LAB — URINALYSIS, ROUTINE W REFLEX MICROSCOPIC
Bilirubin Urine: NEGATIVE
Glucose, UA: NEGATIVE mg/dL
Ketones, ur: NEGATIVE mg/dL
Nitrite: NEGATIVE
Protein, ur: 100 mg/dL — AB
Specific Gravity, Urine: 1.014 (ref 1.005–1.030)
WBC, UA: >50 WBC/hpf — ABNORMAL HIGH (ref 0–5)
pH: 8 (ref 5.0–8.0)

## 2020-10-30 LAB — BLOOD CULTURE ID PANEL (REFLEXED) - BCID2

## 2020-10-30 LAB — CBC WITH DIFFERENTIAL/PLATELET
Abs Immature Granulocytes: 0.04 10*3/uL (ref 0.00–0.07)
Abs Immature Granulocytes: 0.07 10*3/uL (ref 0.00–0.07)
Basophils Absolute: 0.1 10*3/uL (ref 0.0–0.1)
Basophils Absolute: 0.1 10*3/uL (ref 0.0–0.1)
Basophils Relative: 1 %
Basophils Relative: 1 %
Eosinophils Absolute: 0.1 10*3/uL (ref 0.0–0.5)
Eosinophils Absolute: 0.1 10*3/uL (ref 0.0–0.5)
Eosinophils Relative: 1 %
Eosinophils Relative: 1 %
HCT: 39.7 % (ref 36.0–46.0)
HCT: 40.8 % (ref 36.0–46.0)
Hemoglobin: 12.5 g/dL (ref 12.0–15.0)
Hemoglobin: 13.1 g/dL (ref 12.0–15.0)
Immature Granulocytes: 0 %
Immature Granulocytes: 1 %
Lymphocytes Relative: 11 %
Lymphocytes Relative: 14 %
Lymphs Abs: 1.6 10*3/uL (ref 0.7–4.0)
Lymphs Abs: 1.9 10*3/uL (ref 0.7–4.0)
MCH: 32.3 pg (ref 26.0–34.0)
MCH: 32.8 pg (ref 26.0–34.0)
MCHC: 31.5 g/dL (ref 30.0–36.0)
MCHC: 32.1 g/dL (ref 30.0–36.0)
MCV: 102 fL — ABNORMAL HIGH (ref 80.0–100.0)
MCV: 102.6 fL — ABNORMAL HIGH (ref 80.0–100.0)
Monocytes Absolute: 0.9 10*3/uL (ref 0.1–1.0)
Monocytes Absolute: 1 10*3/uL (ref 0.1–1.0)
Monocytes Relative: 6 %
Monocytes Relative: 8 %
Neutro Abs: 10.6 10*3/uL — ABNORMAL HIGH (ref 1.7–7.7)
Neutro Abs: 11.1 10*3/uL — ABNORMAL HIGH (ref 1.7–7.7)
Neutrophils Relative %: 75 %
Neutrophils Relative %: 81 %
Platelets: 238 10*3/uL (ref 150–400)
Platelets: 249 10*3/uL (ref 150–400)
RBC: 3.87 MIL/uL (ref 3.87–5.11)
RBC: 4 MIL/uL (ref 3.87–5.11)
RDW: 13.2 % (ref 11.5–15.5)
RDW: 13.3 % (ref 11.5–15.5)
WBC: 13.7 10*3/uL — ABNORMAL HIGH (ref 4.0–10.5)
WBC: 13.7 10*3/uL — ABNORMAL HIGH (ref 4.0–10.5)
nRBC: 0 % (ref 0.0–0.2)
nRBC: 0 % (ref 0.0–0.2)

## 2020-10-30 LAB — COMPREHENSIVE METABOLIC PANEL
ALT: 18 U/L (ref 0–44)
AST: 26 U/L (ref 15–41)
Albumin: 3.9 g/dL (ref 3.5–5.0)
Alkaline Phosphatase: 88 U/L (ref 38–126)
Anion gap: 11 (ref 5–15)
BUN: 30 mg/dL — ABNORMAL HIGH (ref 8–23)
CO2: 25 mmol/L (ref 22–32)
Calcium: 9.8 mg/dL (ref 8.9–10.3)
Chloride: 104 mmol/L (ref 98–111)
Creatinine, Ser: 1.46 mg/dL — ABNORMAL HIGH (ref 0.44–1.00)
GFR, Estimated: 36 mL/min — ABNORMAL LOW (ref >60)
Glucose, Bld: 137 mg/dL — ABNORMAL HIGH (ref 70–99)
Potassium: 4.3 mmol/L (ref 3.5–5.1)
Sodium: 140 mmol/L (ref 135–145)
Total Bilirubin: 1.2 mg/dL (ref 0.3–1.2)
Total Protein: 7.8 g/dL (ref 6.5–8.1)

## 2020-10-30 LAB — LACTIC ACID, PLASMA
Lactic Acid, Venous: 3 mmol/L (ref 0.5–1.9)
Lactic Acid, Venous: 2.1 mmol/L (ref 0.5–1.9)
Lactic Acid, Venous: 2.6 mmol/L (ref 0.5–1.9)

## 2020-10-30 LAB — LIPASE, BLOOD: Lipase: 31 U/L (ref 11–51)

## 2020-10-30 MED ORDER — POLYETHYLENE GLYCOL 3350 17 G PO PACK: PACK | ORAL | 0 refills | Status: DC

## 2020-10-30 MED ORDER — SODIUM CHLORIDE 0.9 % IV SOLN
2.0000 g | Freq: Once | INTRAVENOUS | Status: AC
  Administered 2020-10-30: 2 g via INTRAVENOUS
  Filled 2020-10-30: qty 2000

## 2020-10-30 MED ORDER — IOHEXOL 300 MG/ML  SOLN
75.0000 mL | Freq: Once | INTRAMUSCULAR | Status: AC | PRN
  Administered 2020-10-30: 75 mL via INTRAVENOUS

## 2020-10-30 MED ORDER — FENTANYL CITRATE PF 50 MCG/ML IJ SOSY
50.0000 ug | PREFILLED_SYRINGE | Freq: Once | INTRAMUSCULAR | Status: AC
  Administered 2020-10-30: 50 ug via INTRAVENOUS
  Filled 2020-10-30: qty 1

## 2020-10-30 MED ORDER — SODIUM CHLORIDE 0.9 % IV SOLN
1.0000 g | Freq: Once | INTRAVENOUS | Status: AC
  Administered 2020-10-30: 1 g via INTRAVENOUS
  Filled 2020-10-30: qty 10

## 2020-10-30 MED ORDER — CEPHALEXIN 500 MG PO CAPS: 500.0000 mg | ORAL_CAPSULE | Freq: Four times a day (QID) | ORAL | 0 refills | Status: DC

## 2020-10-30 MED ORDER — VANCOMYCIN HCL 1500 MG/300ML IV SOLN: 1500.0000 mg | Freq: Once | INTRAVENOUS | Status: DC

## 2020-10-30 NOTE — ED Notes (Signed)
Patient transported to CT 

## 2020-10-30 NOTE — ED Provider Notes (Signed)
Emergency Medicine Provider Triage Evaluation Note  Yvonne Bailey , a 80 y.o. female  was evaluated in triage.  Pt complains of positive blood cultures.  She states that she is not feeling any better or worse.  She was directed to come here..  Review of Systems  Positive: UTI, constipation Negative: syncope  Physical Exam  There were no vitals taken for this visit. Gen:   Awake, no distress   Resp:  Normal effort  MSK:   Moves extremities without difficulty  Other:  Speech is not slurred.   Medical Decision Making  Medically screening exam initiated at 7:04 PM.  Appropriate orders placed.  JOSEPHA BARBIER was informed that the remainder of the evaluation will be completed by another provider, this initial triage assessment does not replace that evaluation, and the importance of remaining in the ED until their evaluation is complete.  Note: Portions of this report may have been transcribed using voice recognition software. Every effort was made to ensure accuracy; however, inadvertent computerized transcription errors may be present    Betzayda, Braxton 10/30/20 1906    Koleen Distance, MD 10/30/20 2329

## 2020-10-30 NOTE — ED Triage Notes (Signed)
Patient was called by hospital to come back to ED due to positive blood cultures. Patient reports that she is being treated for UTI and constipation. Taking antibiotics as directed

## 2020-10-30 NOTE — Discharge Instructions (Addendum)
Take Keflex 4 times daily for the next 5 days to treat a urinary tract infection.   You have a lot of constipation. Recommend Miralax as instructed. Also recommend adding fiber to your diet. You can use Colace as well which is a stool softener. If these treatments fail to produce lots of bowel movements, you can use a Fleet enema while at home.

## 2020-10-30 NOTE — ED Notes (Signed)
Pt stated she has on and off stomach pain. It seems to not get better. She believes she is constipated.

## 2020-10-30 NOTE — ED Provider Notes (Signed)
[facility] EMERGENCY DEPARTMENT Provider Note   CSN: [id number] Arrival date & time: 10/30/20  1819     History No chief complaint on file.   Yvonne Bailey is a 80 y.o. female.  Patient to ED after being contacted by the hospital about an abnormal lab collected yesterday while a patient in the ED. On chart review, her blood culture is positive for Enterococcus faecalis. The patient complains only of intermittent abdominal discomfort, which was present last night on initial ED evaluation. No fever, vomiting.   The history is provided by the patient. No language interpreter was used.      Past Medical History:  Diagnosis Date   Bilateral leg edema 07/19/2015   CAD (coronary artery disease)    a. anterior MI s/p PCI in 1992. b. cath 08/2015 with severe three-vessel CAD turned down for CABG and underwent DESx5 to prox Cx/OM2/D1/oRCA/mRCA   Carotid artery disease ([place])    a. carotid duplex 03/2015 showed 1-39% BICA, normal subclavian arteries, chronically occluded left vertebral, f/u recommended only PRN.   DVT (deep venous thrombosis) ([place])    X1   History of nuclear stress test    a. Myoview 6/17: EF 20-25%, mid anteroseptal, apical anterior, apical septal, apical inferior, apical lateral and apical scar, no ischemia, intermediate risk   HTN (hypertension)    Hyperlipidemia    Hypokalemia    Hypothyroidism    Ischemic cardiomyopathy    a. Echo 6/17: EF 20-25%, apex appears akinetic, MAC, moderate MR, moderate LAE, mild RVE, trivial PI, PASP 47 mmHg (needs repeat with Definity contrast).  b. Limited echo with Definity contrast 7/17: EF 25-30%, moderate to severe LAE. c. Limited Echo 2018 showed EF 40-45%.   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 02/2017 in setting of noncompliance with Eliquis   TIA (transient ischemic attack)    Tricuspid regurgitation     Patient Active Problem List   Diagnosis Date Noted   Confusion 10/17/2020   Acute cystitis 10/17/2020   Decreased GFR 10/17/2020   Rash 08/29/2020   TIA (transient ischemic attack) 06/10/2020   Anxiety and depression 03/28/2020   Overactive bladder 03/28/2020   Lack of motivation 46/27/0350   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   Aphasia as late effect of cerebrovascular accident (CVA)    Acute ischemic cerebrovascular accident (CVA) involving left middle cerebral artery territory [facility])    Coronary artery disease involving native coronary artery without angina pectoris    Stage 3 chronic kidney disease ([place])    Prediabetes 07/28/2016   DOE (dyspnea on exertion) 07/09/2016   Gait instability 07/09/2016   Cardiomyopathy, ischemic    Chronic systolic heart failure ([place]) 08/21/2015   Bilateral leg edema 07/19/2015   Atrial fibrillation ([place]) 07/19/2015   Urinary urgency 03/30/2015   Lacunar infarction ([place]) 12/17/2012   Small vessel disease, cerebrovascular 12/17/2012   CAROTID BRUIT, RIGHT 08/10/2008   Peripheral vascular disease ([place]) 06/04/2007   Diverticulosis of large intestine 06/04/2007   Hypothyroidism 02/24/2007   Dyslipidemia 02/24/2007   Essential hypertension 02/24/2007   Acute thromboembolism of deep veins  of lower extremity ([place]) 01/21/2007   Coronary atherosclerosis 10/29/2006    Past Surgical History:  Procedure Laterality Date   APPENDECTOMY     at hysterectomy and USO for fibroids, Dr. Ysidro Evert   CARDIAC CATHETERIZATION  1992   Dr Eustace Quail   CARDIAC CATHETERIZATION N/A 09/06/2015   Procedure: Right/Left Heart Cath and Coronary Angiography;  Surgeon: Burnell Blanks, MD;  Location: [place] [facility];   Service: Cardiovascular;  Laterality: N/A;   CARDIAC CATHETERIZATION N/A 09/07/2015   Procedure: Coronary Stent Intervention;  Surgeon: Burnell Blanks, MD;  Location: [place] [facility];  Service: Cardiovascular;  Laterality: N/A;   COLONOSCOPY     negative; 2008, Dr. Delfin Edis   fracture LLE     '94; pinned   IR ANGIO INTRA EXTRACRAN SEL COM CAROTID INNOMINATE BILAT MOD SED  03/02/2017   IR ANGIO INTRA EXTRACRAN SEL COM CAROTID INNOMINATE BILAT MOD SED  04/23/2018   IR ANGIO VERTEBRAL SEL SUBCLAVIAN INNOMINATE BILAT MOD SED  04/23/2018   IR ANGIO VERTEBRAL SEL VERTEBRAL UNI R MOD SED  03/02/2017   IR RADIOLOGIST EVAL & MGMT  04/28/2017   IR US GUIDE VASC ACCESS RIGHT  04/23/2018   RADIOLOGY WITH ANESTHESIA N/A 03/02/2017   Procedure: RADIOLOGY WITH ANESTHESIA;  Surgeon: Luanne Bras, MD;  Location: [place];  Service: Radiology;  Laterality: N/A;   TONSILLECTOMY     TOTAL ABDOMINAL HYSTERECTOMY     & BSO for fibroids     OB History     Gravida  4   Para  1   Term  1   Preterm      AB  3   Living  1      SAB  3   IAB      Ectopic      Multiple      Live Births  1           Family History  Problem Relation Age of Onset   Diabetes Mother    Hypertension Mother    Stroke Brother        ?> 20   Coronary artery disease Brother        stent in 57s   Cancer Neg Hx     Social History   Tobacco Use   Smoking status: Former    Types: Cigarettes    Quit date: 03/11/1990    Years since quitting: 30.6   Smokeless tobacco: Never   Tobacco comments:    smoked 1973- 1992, up to < 5 cigarettes  Vaping Use   Vaping Use: Never used  Substance Use Topics   Alcohol use: No   Drug use: No    Home Medications Prior to Admission medications   Medication Sig Start Date End Date Taking? Authorizing Provider  amLODipine (NORVASC) 2.5 MG tablet TAKE 1 TABLET(2.5 MG) BY MOUTH DAILY. FOLLOW-UP Patient taking differently: Take 2.5 mg by mouth daily. 10/09/20    Binnie Rail, MD  apixaban (ELIQUIS) 5 MG TABS tablet Take 1 tablet (5 mg total) by mouth 2 (two) times daily. 07/27/20   Burnell Blanks, MD  aspirin EC 81 MG tablet Take 1 tablet (81 mg total) by mouth daily. 04/21/17   Imogene Burn, PA-C  atorvastatin (LIPITOR) 80 MG tablet TAKE 1 TABLET(80 MG) BY MOUTH DAILY Patient taking differently: Take 80 mg by mouth daily. 06/26/20   Binnie Rail, MD  benazepril (LOTENSIN) 20 MG tablet TAKE 1 AND 1/2  TABLETS(30 MG) BY MOUTH DAILY Patient taking differently: Take 30 mg by mouth daily. 03/23/20   Binnie Rail, MD  brimonidine (ALPHAGAN) 0.2 % ophthalmic solution Place 1 drop into both eyes 3 (three) times daily.  12/24/17   [provider]  cephALEXin (KEFLEX) 500 MG capsule Take 1 capsule (500 mg total) by mouth 4 (four) times daily. 10/30/20   Charlann Lange, PA-C  dorzolamide-timolol (COSOPT) 22.3-6.8 MG/ML ophthalmic solution Place 1 drop into both eyes 2 (two) times daily.     [provider]  furosemide (LASIX) 20 MG tablet TAKE 1 TABLET(20 MG) BY MOUTH DAILY Patient taking differently: Take 20 mg by mouth daily. 09/26/20   Imogene Burn, PA-C  levothyroxine (SYNTHROID) 88 MCG tablet TAKE 1 TABLET(88 MCG) BY MOUTH DAILY Patient taking differently: Take 88 mcg by mouth daily before breakfast. 08/04/20   Burns, Claudina Lick, MD  metoprolol succinate (TOPROL-XL) 50 MG 24 hr tablet TAKE 1 TABLET(50 MG) BY MOUTH DAILY Patient taking differently: Take 50 mg by mouth daily. 08/09/20   Burnell Blanks, MD  MYRBETRIQ 25 MG TB24 tablet TAKE 1 TABLET(25 MG) BY MOUTH DAILY Patient taking differently: Take 25 mg by mouth daily. 09/05/20   Binnie Rail, MD  nitroGLYCERIN (NITROSTAT) 0.4 MG SL tablet Place 1 tablet (0.4 mg total) under the tongue every 5 (five) minutes as needed for chest pain. 09/08/15   Arbutus Leas, NP  nystatin-triamcinolone ointment [name]) Apply 1 application topically 2 (two) times daily. 08/29/20    Hoyt Koch, MD  polyethylene glycol (MIRALAX) 17 g packet Take one dose 3 times a day until bowel clear, maximum of 3 consecutive days. 10/30/20   Charlann Lange, PA-C  sertraline (ZOLOFT) 50 MG tablet Take 1 tablet (50 mg total) by mouth daily. 05/04/20   Binnie Rail, MD  spironolactone (ALDACTONE) 25 MG tablet TAKE 1 TABLET(25 MG) BY MOUTH DAILY Patient taking differently: Take 25 mg by mouth daily. 08/04/20   Binnie Rail, MD  travoprost, benzalkonium, (TRAVATAN) 0.004 % ophthalmic solution Place 1 drop into both eyes at bedtime.    [provider]    Allergies    Definity [perflutren lipid microsphere] and Cilostazol  Review of Systems   Review of Systems  Constitutional:  Negative for chills and fever.  HENT: Negative.    Respiratory: Negative.    Cardiovascular: Negative.   Gastrointestinal:  Positive for abdominal pain and constipation.  Musculoskeletal: Negative.   Skin: Negative.   Neurological: Negative.    Physical Exam Updated Vital Signs BP 126/73   Pulse 86   Temp 98 F (36.7 C) (Oral)   Resp 17   SpO2 97%   Physical Exam Vitals and nursing note reviewed.  Constitutional:      Appearance: She is well-developed.  HENT:     Head: Normocephalic.  Cardiovascular:     Rate and Rhythm: Normal rate and regular rhythm.     Heart sounds: No murmur heard. Pulmonary:     Effort: Pulmonary effort is normal.     Breath sounds: Normal breath sounds. No wheezing, rhonchi or rales.  Abdominal:     General: Bowel sounds are normal.     Palpations: Abdomen is soft.     Tenderness: There is no abdominal tenderness. There is no guarding or rebound.  Musculoskeletal:        General: Normal range of motion.     Cervical back: Normal range of motion and neck supple.  Skin:  General: Skin is warm and dry.  Neurological:     General: No focal deficit present.     Mental Status: She is alert and oriented to person, place, and time.    ED Results /  Procedures / Treatments   Labs (all labs ordered are listed, but only abnormal results are displayed) Labs Reviewed  CBC WITH DIFFERENTIAL/PLATELET - Abnormal; Notable for the following components:      Result Value   WBC 13.7 (*)    MCV 102.6 (*)    Neutro Abs 10.6 (*)    All other components within normal limits  BASIC METABOLIC PANEL - Abnormal; Notable for the following components:   Glucose, Bld 129 (*)    BUN 36 (*)    Creatinine, Ser 1.72 (*)    GFR, Estimated 30 (*)    All other components within normal limits  LACTIC ACID, PLASMA - Abnormal; Notable for the following components:   Lactic Acid, Venous 2.6 (*)    All other components within normal limits  RESP PANEL BY RT-PCR (FLU A&B, COVID) [id number]  LACTIC ACID, PLASMA    EKG None  Radiology CT ABDOMEN PELVIS W CONTRAST  Result Date: 10/30/2020 CLINICAL DATA:  Abdominal pain. EXAM: CT ABDOMEN AND PELVIS WITH CONTRAST TECHNIQUE: Multidetector CT imaging of the abdomen and pelvis was performed using the standard protocol following bolus administration of intravenous contrast. CONTRAST:  66m OMNIPAQUE IOHEXOL 300 MG/ML  SOLN COMPARISON:  None. FINDINGS: Lower chest: Small focus of calcification along the right hemidiaphragm. The visualized lung bases are otherwise clear. There is mild cardiomegaly. Coronary vascular calcification. No intra-abdominal free air or free fluid. Hepatobiliary: The liver is unremarkable. No intrahepatic biliary dilatation. The gallbladder is unremarkable. Pancreas: Unremarkable. No pancreatic ductal dilatation or surrounding inflammatory changes. Spleen: Normal in size without focal abnormality. Adrenals/Urinary Tract: Left adrenal thickening. The right adrenal glands unremarkable. There is a 15 mm stone in the upper pole of the right kidney. An 8 mm calculus noted in the inferior pole of the right kidney. There is a 3.5 cm cyst in the inferior pole of the right kidney. Subcentimeter left renal hypodense  lesion is too small to characterize, likely cysts. There is no hydronephrosis on either side. There is symmetric enhancement and excretion of contrast by both kidneys. The visualized ureters and the urinary bladder appear unremarkable. Stomach/Bowel: There is severe sigmoid diverticulosis. Large stool noted in the rectal vault. There is no bowel obstruction or active inflammation. Vascular/Lymphatic: Advanced aortoiliac atherosclerotic disease. The IVC is unremarkable. No portal venous gas. There is no adenopathy. Reproductive: Hysterectomy. Other: None Musculoskeletal: Osteopenia with degenerative changes of the spine and hips. No acute osseous pathology. IMPRESSION: 1. No acute intra-abdominal or pelvic pathology. 2. Severe sigmoid diverticulosis. No bowel obstruction. 3. Large fecal matter within the rectal vault may represent impacted stool. 4. Nonobstructing right renal calculi. No hydronephrosis. 5. Aortic Atherosclerosis (ICD10-I70.0). Electronically Signed   By: AAnner CreteM.D.   On: 10/30/2020 02:56    Procedures Procedures   Medications Ordered in ED Medications  ampicillin (OMNIPEN) 2 g in sodium chloride 0.9 % 100 mL IVPB (2 g Intravenous New Bag/Given 10/30/20 2319)    ED Course  I have reviewed the triage vital signs and the nursing notes.  Pertinent labs & imaging results that were available during my care of the patient were reviewed by me and considered in my medical decision making (see chart for details).    MDM Rules/Calculators/A&P  Patient to ED for treatment of positive blood cultures obtained yesterday showing Enterococcus. She denies any new symptoms.   Discussed with pharmacist who recommends Ampicillin. IV fluid bolus of 1 liter provided, followed by infusion of 143m/hr. No fever, VSS, lactate <4. Mild AKI of 1.75. Consistently elevated creatinine over the last 7 months, normal prior to this.   Hospitalist paged for admission to  continue IV therapy for Enterococcus bacteremia. The patient has been seen by Dr. RRalene Bathe   Final Clinical Impression(s) / ED Diagnoses Final diagnoses:  None   Enterococcus bacteremia  Rx / DC Orders ED Discharge Orders     None        UDennie Bible08/23/22 0029    RQuintella Reichert MD 10/31/20 [phone]

## 2020-10-30 NOTE — ED Provider Notes (Signed)
[facility] EMERGENCY DEPARTMENT Provider Note   CSN: [id number] Arrival date & time: 10/29/20  2252     History Chief Complaint  Patient presents with   Abdominal Pain    Yvonne Bailey is a 80 y.o. female.  Patient to ED with complaint of abdominal pain that started about 3 days ago. She describes the pain as intermittent and mostly in the epigastric area and suprapubic abdomen. No urinary symptoms. No fever, nausea or vomiting. She reports her last bowel movement was several days ago. No chest pain, SOB, cough. Previous abdominal surgeries include complete hysterectomy only.   The history is provided by the patient. No language interpreter was used.  Abdominal Pain Associated symptoms: constipation   Associated symptoms: no chills, no dysuria, no fever and no vomiting       Past Medical History:  Diagnosis Date   Bilateral leg edema 07/19/2015   CAD (coronary artery disease)    a. anterior MI s/p PCI in 1992. b. cath 08/2015 with severe three-vessel CAD turned down for CABG and underwent DESx5 to prox Cx/OM2/D1/oRCA/mRCA   Carotid artery disease ([place])    a. carotid duplex 03/2015 showed 1-39% BICA, normal subclavian arteries, chronically occluded left vertebral, f/u recommended only PRN.   DVT (deep venous thrombosis) ([place])    X1   History of nuclear stress test    a. Myoview 6/17: EF 20-25%, mid anteroseptal, apical anterior, apical septal, apical inferior, apical lateral and apical scar, no ischemia, intermediate risk   HTN (hypertension)    Hyperlipidemia    Hypokalemia    Hypothyroidism    Ischemic cardiomyopathy    a. Echo 6/17: EF 20-25%, apex appears akinetic, MAC, moderate MR, moderate LAE, mild RVE, trivial PI, PASP 47 mmHg (needs repeat with Definity contrast).  b. Limited echo with Definity contrast 7/17: EF 25-30%, moderate to severe LAE. c. Limited Echo 2018 showed EF 40-45%.   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 02/2017 in setting of noncompliance with Eliquis   TIA (transient ischemic attack)    Tricuspid regurgitation     Patient Active Problem List   Diagnosis Date Noted   Confusion 10/17/2020   Acute cystitis 10/17/2020   Decreased GFR 10/17/2020   Rash 08/29/2020   TIA (transient ischemic attack) 06/10/2020   Anxiety and depression 03/28/2020   Overactive bladder 03/28/2020   Lack of motivation 83/41/9622   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   Aphasia as late effect of cerebrovascular accident (CVA)    Acute ischemic cerebrovascular accident (CVA) involving left middle cerebral artery [facility])    Coronary artery disease involving native coronary artery without angina pectoris    Stage 3 chronic kidney disease ([place])    Prediabetes 07/28/2016   DOE (dyspnea on exertion) 07/09/2016   Gait instability 07/09/2016   Cardiomyopathy, ischemic    Chronic systolic heart failure ([place]) 08/21/2015   Bilateral leg edema 07/19/2015   Atrial fibrillation ([place]) 07/19/2015   Urinary urgency 03/30/2015   Lacunar infarction ([place]) 12/17/2012   Small vessel disease, cerebrovascular 12/17/2012   CAROTID BRUIT, RIGHT 08/10/2008  Peripheral vascular disease ([place]) 06/04/2007   Diverticulosis of large intestine 06/04/2007   Hypothyroidism 02/24/2007   Dyslipidemia 02/24/2007   Essential hypertension 02/24/2007   Acute thromboembolism of deep veins of lower extremity ([place]) 01/21/2007   Coronary atherosclerosis 10/29/2006    Past Surgical History:  Procedure Laterality Date   APPENDECTOMY     at hysterectomy and USO for fibroids, Dr. Ysidro Evert   CARDIAC CATHETERIZATION   1992   Dr Eustace Quail   CARDIAC CATHETERIZATION N/A 09/06/2015   Procedure: Right/Left Heart Cath and Coronary Angiography;  Surgeon: Burnell Blanks, MD;  Location: [place] CV LAB;  Service: Cardiovascular;  Laterality: N/A;   CARDIAC CATHETERIZATION N/A 09/07/2015   Procedure: Coronary Stent Intervention;  Surgeon: Burnell Blanks, MD;  Location: [place] CV LAB;  Service: Cardiovascular;  Laterality: N/A;   COLONOSCOPY     negative; 2008, Dr. Delfin Edis   fracture LLE     '94; pinned   IR ANGIO INTRA EXTRACRAN SEL COM CAROTID INNOMINATE BILAT MOD SED  03/02/2017   IR ANGIO INTRA EXTRACRAN SEL COM CAROTID INNOMINATE BILAT MOD SED  04/23/2018   IR ANGIO VERTEBRAL SEL SUBCLAVIAN INNOMINATE BILAT MOD SED  04/23/2018   IR ANGIO VERTEBRAL SEL VERTEBRAL UNI R MOD SED  03/02/2017   IR RADIOLOGIST EVAL & MGMT  04/28/2017   IR US GUIDE VASC ACCESS RIGHT  04/23/2018   RADIOLOGY WITH ANESTHESIA N/A 03/02/2017   Procedure: RADIOLOGY WITH ANESTHESIA;  Surgeon: Luanne Bras, MD;  Location: [place];  Service: Radiology;  Laterality: N/A;   TONSILLECTOMY     TOTAL ABDOMINAL HYSTERECTOMY     & BSO for fibroids     OB History     Gravida  4   Para  1   Term  1   Preterm      AB  3   Living  1      SAB  3   IAB      Ectopic      Multiple      Live Births  1           Family History  Problem Relation Age of Onset   Diabetes Mother    Hypertension Mother    Stroke Brother        ?> 61   Coronary artery disease Brother        stent in 31s   Cancer Neg Hx     Social History   Tobacco Use   Smoking status: Former    Types: Cigarettes    Quit date: 03/11/1990    Years since quitting: 30.6   Smokeless tobacco: Never   Tobacco comments:    smoked 1973- 1992, up to < 5 cigarettes  Vaping Use   Vaping Use: Never used  Substance Use Topics   Alcohol use: No   Drug use: No    Home Medications Prior to Admission medications   Medication Sig  Start Date End Date Taking? Authorizing Provider  amLODipine (NORVASC) 2.5 MG tablet TAKE 1 TABLET(2.5 MG) BY MOUTH DAILY. FOLLOW-UP Patient taking differently: Take 2.5 mg by mouth daily. 10/09/20   Binnie Rail, MD  apixaban (ELIQUIS) 5 MG TABS tablet Take 1 tablet (5 mg total) by mouth 2 (two) times daily. 07/27/20   Burnell Blanks, MD  aspirin EC 81 MG tablet Take 1 tablet (81 mg total) by mouth daily. 04/21/17   Imogene Burn, PA-C  atorvastatin (LIPITOR) 80 MG tablet TAKE  1 TABLET(80 MG) BY MOUTH DAILY Patient taking differently: Take 80 mg by mouth daily. 06/26/20   Burns, Claudina Lick, MD  benazepril (LOTENSIN) 20 MG tablet TAKE 1 AND 1/2 TABLETS(30 MG) BY MOUTH DAILY Patient taking differently: Take 30 mg by mouth daily. 03/23/20   Binnie Rail, MD  brimonidine (ALPHAGAN) 0.2 % ophthalmic solution Place 1 drop into both eyes 3 (three) times daily.  12/24/17   [provider]  cephALEXin (KEFLEX) 500 MG capsule Take 1 capsule (500 mg total) by mouth 2 (two) times daily. Patient taking differently: Take 500 mg by mouth See admin instructions. Bid x 7 days 10/17/20   Binnie Rail, MD  dorzolamide-timolol (COSOPT) 22.3-6.8 MG/ML ophthalmic solution Place 1 drop into both eyes 2 (two) times daily.     [provider]  furosemide (LASIX) 20 MG tablet TAKE 1 TABLET(20 MG) BY MOUTH DAILY Patient taking differently: Take 20 mg by mouth daily. 09/26/20   Imogene Burn, PA-C  levothyroxine (SYNTHROID) 88 MCG tablet TAKE 1 TABLET(88 MCG) BY MOUTH DAILY Patient taking differently: Take 88 mcg by mouth daily before breakfast. 08/04/20   Burns, Claudina Lick, MD  metoprolol succinate (TOPROL-XL) 50 MG 24 hr tablet TAKE 1 TABLET(50 MG) BY MOUTH DAILY Patient taking differently: Take 50 mg by mouth daily. 08/09/20   Burnell Blanks, MD  MYRBETRIQ 25 MG TB24 tablet TAKE 1 TABLET(25 MG) BY MOUTH DAILY Patient taking differently: Take 25 mg by mouth daily. 09/05/20   Binnie Rail,  MD  nitroGLYCERIN (NITROSTAT) 0.4 MG SL tablet Place 1 tablet (0.4 mg total) under the tongue every 5 (five) minutes as needed for chest pain. 09/08/15   Arbutus Leas, NP  nystatin-triamcinolone ointment [facility]) Apply 1 application topically 2 (two) times daily. 08/29/20   Hoyt Koch, MD  sertraline (ZOLOFT) 50 MG tablet Take 1 tablet (50 mg total) by mouth daily. 05/04/20   Binnie Rail, MD  spironolactone (ALDACTONE) 25 MG tablet TAKE 1 TABLET(25 MG) BY MOUTH DAILY Patient taking differently: Take 25 mg by mouth daily. 08/04/20   Binnie Rail, MD  travoprost, benzalkonium, (TRAVATAN) 0.004 % ophthalmic solution Place 1 drop into both eyes at bedtime.    [provider]    Allergies    Definity [perflutren lipid microsphere] and Cilostazol  Review of Systems   Review of Systems  Constitutional:  Negative for chills and fever.  HENT: Negative.    Respiratory: Negative.    Cardiovascular: Negative.   Gastrointestinal:  Positive for abdominal pain and constipation. Negative for vomiting.  Genitourinary:  Negative for dysuria and flank pain.  Musculoskeletal: Negative.  Negative for back pain.  Skin: Negative.   Neurological: Negative.    Physical Exam Updated Vital Signs BP 125/74 (BP Location: Left Arm)   Pulse 86   Temp 98.5 F (36.9 C) (Oral)   Resp 16   Ht _0  (1.702 m)   Wt 73.9 kg   SpO2 100%   BMI 25.53 kg/m   Physical Exam Vitals and nursing note reviewed.  Constitutional:      Appearance: She is well-developed.  HENT:     Head: Normocephalic.  Cardiovascular:     Rate and Rhythm: Normal rate and regular rhythm.     Heart sounds: No murmur heard. Pulmonary:     Effort: Pulmonary effort is normal.     Breath sounds: Normal breath sounds. No wheezing, rhonchi or rales.  Abdominal:     General:  Bowel sounds are normal. There is no distension.     Palpations: Abdomen is soft.     Tenderness: There is abdominal tenderness in the epigastric  area and suprapubic area. There is no guarding or rebound.  Genitourinary:    Rectum: Normal. No mass.     Comments: No palpable fecal impaction on digital exam.  Musculoskeletal:        General: Normal range of motion.     Cervical back: Normal range of motion and neck supple.  Skin:    General: Skin is warm and dry.  Neurological:     General: No focal deficit present.     Mental Status: She is alert and oriented to person, place, and time.    ED Results / Procedures / Treatments   Labs (all labs ordered are listed, but only abnormal results are displayed) Labs Reviewed  CBC WITH DIFFERENTIAL/PLATELET - Abnormal; Notable for the following components:      Result Value   WBC 13.7 (*)    MCV 102.0 (*)    Neutro Abs 11.1 (*)    All other components within normal limits  COMPREHENSIVE METABOLIC PANEL - Abnormal; Notable for the following components:   Glucose, Bld 137 (*)    BUN 30 (*)    Creatinine, Ser 1.46 (*)    GFR, Estimated 36 (*)    All other components within normal limits  URINALYSIS, ROUTINE W REFLEX MICROSCOPIC - Abnormal; Notable for the following components:   Color, Urine AMBER (*)    APPearance CLOUDY (*)    Hgb urine dipstick SMALL (*)    Protein, ur 100 (*)    Leukocytes,Ua LARGE (*)    WBC, UA >50 (*)    Bacteria, UA MANY (*)    All other components within normal limits  LACTIC ACID, PLASMA - Abnormal; Notable for the following components:   Lactic Acid, Venous 3.0 (*)    All other components within normal limits  LACTIC ACID, PLASMA - Abnormal; Notable for the following components:   Lactic Acid, Venous 2.1 (*)    All other components within normal limits  CULTURE, BLOOD (ROUTINE X 2)  CULTURE, BLOOD (ROUTINE X 2)  URINE CULTURE  LIPASE, BLOOD   Results for orders placed or performed during the hospital encounter of 10/29/20  CBC with Differential  Result Value Ref Range   WBC 13.7 (H) 4.0 - 10.5 K/uL   RBC 4.00 3.87 - 5.11 MIL/uL   Hemoglobin  13.1 12.0 - 15.0 g/dL   HCT 40.8 36.0 - 46.0 %   MCV 102.0 (H) 80.0 - 100.0 fL   MCH 32.8 26.0 - 34.0 pg   MCHC 32.1 30.0 - 36.0 g/dL   RDW 13.2 11.5 - 15.5 %   Platelets 238 150 - 400 K/uL   nRBC 0.0 0.0 - 0.2 %   Neutrophils Relative % 81 %   Neutro Abs 11.1 (H) 1.7 - 7.7 K/uL   Lymphocytes Relative 11 %   Lymphs Abs 1.6 0.7 - 4.0 K/uL   Monocytes Relative 6 %   Monocytes Absolute 0.9 0.1 - 1.0 K/uL   Eosinophils Relative 1 %   Eosinophils Absolute 0.1 0.0 - 0.5 K/uL   Basophils Relative 1 %   Basophils Absolute 0.1 0.0 - 0.1 K/uL   Immature Granulocytes 0 %   Abs Immature Granulocytes 0.04 0.00 - 0.07 K/uL  Comprehensive metabolic panel  Result Value Ref Range   Sodium 140 135 - 145 mmol/L  Potassium 4.3 3.5 - 5.1 mmol/L   Chloride 104 98 - 111 mmol/L   CO2 25 22 - 32 mmol/L   Glucose, Bld 137 (H) 70 - 99 mg/dL   BUN 30 (H) 8 - 23 mg/dL   Creatinine, Ser 1.46 (H) 0.44 - 1.00 mg/dL   Calcium 9.8 8.9 - 10.3 mg/dL   Total Protein 7.8 6.5 - 8.1 g/dL   Albumin 3.9 3.5 - 5.0 g/dL   AST 26 15 - 41 U/L   ALT 18 0 - 44 U/L   Alkaline Phosphatase 88 38 - 126 U/L   Total Bilirubin 1.2 0.3 - 1.2 mg/dL   GFR, Estimated 36 (L) >60 mL/min   Anion gap 11 5 - 15  Lipase, blood  Result Value Ref Range   Lipase 31 11 - 51 U/L  Urinalysis, Routine w reflex microscopic  Result Value Ref Range   Color, Urine AMBER (A) YELLOW   APPearance CLOUDY (A) CLEAR   Specific Gravity, Urine 1.014 1.005 - 1.030   pH 8.0 5.0 - 8.0   Glucose, UA NEGATIVE NEGATIVE mg/dL   Hgb urine dipstick SMALL (A) NEGATIVE   Bilirubin Urine NEGATIVE NEGATIVE   Ketones, ur NEGATIVE NEGATIVE mg/dL   Protein, ur 100 (A) NEGATIVE mg/dL   Nitrite NEGATIVE NEGATIVE   Leukocytes,Ua LARGE (A) NEGATIVE   RBC / HPF 11-20 0 - 5 RBC/hpf   WBC, UA >50 (H) 0 - 5 WBC/hpf   Bacteria, UA MANY (A) NONE SEEN   Squamous Epithelial / LPF 0-5 0 - 5   Hyaline Casts, UA PRESENT    Triple Phosphate Crystal PRESENT   Lactic  acid, plasma  Result Value Ref Range   Lactic Acid, Venous 3.0 (HH) 0.5 - 1.9 mmol/L  Lactic acid, plasma  Result Value Ref Range   Lactic Acid, Venous 2.1 (HH) 0.5 - 1.9 mmol/L    EKG None  Radiology CT ABDOMEN PELVIS W CONTRAST  Result Date: 10/30/2020 CLINICAL DATA:  Abdominal pain. EXAM: CT ABDOMEN AND PELVIS WITH CONTRAST TECHNIQUE: Multidetector CT imaging of the abdomen and pelvis was performed using the standard protocol following bolus administration of intravenous contrast. CONTRAST:  20m OMNIPAQUE IOHEXOL 300 MG/ML  SOLN COMPARISON:  None. FINDINGS: Lower chest: Small focus of calcification along the right hemidiaphragm. The visualized lung bases are otherwise clear. There is mild cardiomegaly. Coronary vascular calcification. No intra-abdominal free air or free fluid. Hepatobiliary: The liver is unremarkable. No intrahepatic biliary dilatation. The gallbladder is unremarkable. Pancreas: Unremarkable. No pancreatic ductal dilatation or surrounding inflammatory changes. Spleen: Normal in size without focal abnormality. Adrenals/Urinary Tract: Left adrenal thickening. The right adrenal glands unremarkable. There is a 15 mm stone in the upper pole of the right kidney. An 8 mm calculus noted in the inferior pole of the right kidney. There is a 3.5 cm cyst in the inferior pole of the right kidney. Subcentimeter left renal hypodense lesion is too small to characterize, likely cysts. There is no hydronephrosis on either side. There is symmetric enhancement and excretion of contrast by both kidneys. The visualized ureters and the urinary bladder appear unremarkable. Stomach/Bowel: There is severe sigmoid diverticulosis. Large stool noted in the rectal vault. There is no bowel obstruction or active inflammation. Vascular/Lymphatic: Advanced aortoiliac atherosclerotic disease. The IVC is unremarkable. No portal venous gas. There is no adenopathy. Reproductive: Hysterectomy. Other: None  Musculoskeletal: Osteopenia with degenerative changes of the spine and hips. No acute osseous pathology. IMPRESSION: 1. No acute intra-abdominal or pelvic pathology.  2. Severe sigmoid diverticulosis. No bowel obstruction. 3. Large fecal matter within the rectal vault may represent impacted stool. 4. Nonobstructing right renal calculi. No hydronephrosis. 5. Aortic Atherosclerosis (ICD10-I70.0). Electronically Signed   By: Anner Crete M.D.   On: 10/30/2020 02:56    Procedures Procedures   Medications Ordered in ED Medications  cefTRIAXone (ROCEPHIN) 1 g in sodium chloride 0.9 % 100 mL IVPB (1 g Intravenous New Bag/Given 10/30/20 0357)  fentaNYL (SUBLIMAZE) injection 50 mcg (50 mcg Intravenous Given 10/30/20 0054)  iohexol (OMNIPAQUE) 300 MG/ML solution 75 mL (75 mLs Intravenous Contrast Given 10/30/20 0243)    ED Course  I have reviewed the triage vital signs and the nursing notes.  Pertinent labs & imaging results that were available during my care of the patient were reviewed by me and considered in my medical decision making (see chart for details).    MDM Rules/Calculators/A&P                           Patient to ED with ss/sxs as per HPI.   Very well appearing, spry 80 yo in NAD. VSS, afebrile. No concern for sepsis on initial exam. Labs, imaging pending.   Las reviewed and significant for mild leukocytosis of 13.7. Her initial lactic acid elevated at 3.0, but decreased to 2.1 on repeat. No tachycardia, subjective or objective fever, hypotension. Doubt sepsis. UA positive for infection. Rocephin provided.   Imaging shows large stool burden without obstructive changes, ?fecal impaction, however, exam shows no impaction. Will provide recommendations to relieve constipation.   She is felt appropriate for discharge home.   Final Clinical Impression(s) / ED Diagnoses Final diagnoses:  None   Constipation UTI  Rx / DC Orders ED Discharge Orders     None        Charlann Lange, PA-C 10/30/20 0417    Veryl Speak, MD 10/30/20 [phone]

## 2020-10-30 NOTE — ED Notes (Signed)
RN spoke with pt's son, [name], to discuss discharge paperwork and confirm that he is able to pick her up from the ED. Son acknowledged d/c information and stated that he "will be at the hospital shortly" for pt.

## 2020-10-30 NOTE — ED Notes (Signed)
E-signature pad unavailable at time of pt discharge. This RN discussed discharge materials with pt and answered all pt questions. Pt stated understanding of discharge material. ? ?

## 2020-10-31 DIAGNOSIS — R7881 Bacteremia: Secondary | ICD-10-CM | POA: Diagnosis not present

## 2020-10-31 DIAGNOSIS — N3000 Acute cystitis without hematuria: Secondary | ICD-10-CM | POA: Diagnosis present

## 2020-10-31 DIAGNOSIS — N179 Acute kidney failure, unspecified: Secondary | ICD-10-CM | POA: Diagnosis not present

## 2020-10-31 DIAGNOSIS — Z955 Presence of coronary angioplasty implant and graft: Secondary | ICD-10-CM | POA: Diagnosis not present

## 2020-10-31 DIAGNOSIS — N1831 Chronic kidney disease, stage 3a: Secondary | ICD-10-CM | POA: Diagnosis not present

## 2020-10-31 DIAGNOSIS — I4819 Other persistent atrial fibrillation: Secondary | ICD-10-CM

## 2020-10-31 DIAGNOSIS — N1832 Chronic kidney disease, stage 3b: Secondary | ICD-10-CM | POA: Diagnosis present

## 2020-10-31 DIAGNOSIS — N3001 Acute cystitis with hematuria: Secondary | ICD-10-CM | POA: Diagnosis not present

## 2020-10-31 DIAGNOSIS — I5022 Chronic systolic (congestive) heart failure: Secondary | ICD-10-CM | POA: Diagnosis present

## 2020-10-31 DIAGNOSIS — N189 Chronic kidney disease, unspecified: Secondary | ICD-10-CM | POA: Diagnosis present

## 2020-10-31 DIAGNOSIS — I2510 Atherosclerotic heart disease of native coronary artery without angina pectoris: Secondary | ICD-10-CM | POA: Diagnosis present

## 2020-10-31 DIAGNOSIS — K5641 Fecal impaction: Secondary | ICD-10-CM | POA: Diagnosis present

## 2020-10-31 DIAGNOSIS — I714 Abdominal aortic aneurysm, without rupture: Secondary | ICD-10-CM | POA: Diagnosis present

## 2020-10-31 DIAGNOSIS — Z79899 Other long term (current) drug therapy: Secondary | ICD-10-CM | POA: Diagnosis not present

## 2020-10-31 DIAGNOSIS — F0390 Unspecified dementia without behavioral disturbance: Secondary | ICD-10-CM | POA: Diagnosis present

## 2020-10-31 DIAGNOSIS — I10 Essential (primary) hypertension: Secondary | ICD-10-CM | POA: Diagnosis not present

## 2020-10-31 DIAGNOSIS — I083 Combined rheumatic disorders of mitral, aortic and tricuspid valves: Secondary | ICD-10-CM | POA: Diagnosis not present

## 2020-10-31 DIAGNOSIS — N183 Chronic kidney disease, stage 3 unspecified: Secondary | ICD-10-CM | POA: Diagnosis not present

## 2020-10-31 DIAGNOSIS — F32A Depression, unspecified: Secondary | ICD-10-CM | POA: Diagnosis present

## 2020-10-31 DIAGNOSIS — Z951 Presence of aortocoronary bypass graft: Secondary | ICD-10-CM | POA: Diagnosis not present

## 2020-10-31 DIAGNOSIS — I130 Hypertensive heart and chronic kidney disease with heart failure and stage 1 through stage 4 chronic kidney disease, or unspecified chronic kidney disease: Secondary | ICD-10-CM | POA: Diagnosis present

## 2020-10-31 DIAGNOSIS — B952 Enterococcus as the cause of diseases classified elsewhere: Secondary | ICD-10-CM

## 2020-10-31 DIAGNOSIS — E039 Hypothyroidism, unspecified: Secondary | ICD-10-CM | POA: Diagnosis not present

## 2020-10-31 DIAGNOSIS — I480 Paroxysmal atrial fibrillation: Secondary | ICD-10-CM | POA: Diagnosis present

## 2020-10-31 DIAGNOSIS — B9620 Unspecified Escherichia coli [E. coli] as the cause of diseases classified elsewhere: Secondary | ICD-10-CM | POA: Diagnosis present

## 2020-10-31 DIAGNOSIS — Z8673 Personal history of transient ischemic attack (TIA), and cerebral infarction without residual deficits: Secondary | ICD-10-CM | POA: Diagnosis not present

## 2020-10-31 DIAGNOSIS — B964 Proteus (mirabilis) (morganii) as the cause of diseases classified elsewhere: Secondary | ICD-10-CM | POA: Diagnosis present

## 2020-10-31 DIAGNOSIS — Z20822 Contact with and (suspected) exposure to covid-19: Secondary | ICD-10-CM | POA: Diagnosis not present

## 2020-10-31 DIAGNOSIS — I255 Ischemic cardiomyopathy: Secondary | ICD-10-CM | POA: Diagnosis present

## 2020-10-31 DIAGNOSIS — I351 Nonrheumatic aortic (valve) insufficiency: Secondary | ICD-10-CM | POA: Diagnosis not present

## 2020-10-31 DIAGNOSIS — I252 Old myocardial infarction: Secondary | ICD-10-CM | POA: Diagnosis not present

## 2020-10-31 DIAGNOSIS — Z87891 Personal history of nicotine dependence: Secondary | ICD-10-CM | POA: Diagnosis not present

## 2020-10-31 LAB — BASIC METABOLIC PANEL
Anion gap: 7 (ref 5–15)
BUN: 34 mg/dL — ABNORMAL HIGH (ref 8–23)
CO2: 25 mmol/L (ref 22–32)
Calcium: 8.6 mg/dL — ABNORMAL LOW (ref 8.9–10.3)
Chloride: 108 mmol/L (ref 98–111)
Creatinine, Ser: 1.36 mg/dL — ABNORMAL HIGH (ref 0.44–1.00)
GFR, Estimated: 39 mL/min — ABNORMAL LOW (ref >60)
Glucose, Bld: 92 mg/dL (ref 70–99)
Potassium: 4.1 mmol/L (ref 3.5–5.1)
Sodium: 140 mmol/L (ref 135–145)

## 2020-10-31 LAB — CBC
HCT: 34.2 % — ABNORMAL LOW (ref 36.0–46.0)
Hemoglobin: 11.1 g/dL — ABNORMAL LOW (ref 12.0–15.0)
MCH: 32.6 pg (ref 26.0–34.0)
MCHC: 32.5 g/dL (ref 30.0–36.0)
MCV: 100.6 fL — ABNORMAL HIGH (ref 80.0–100.0)
Platelets: 200 10*3/uL (ref 150–400)
RBC: 3.4 MIL/uL — ABNORMAL LOW (ref 3.87–5.11)
RDW: 13.3 % (ref 11.5–15.5)
WBC: 10.8 10*3/uL — ABNORMAL HIGH (ref 4.0–10.5)
nRBC: 0 % (ref 0.0–0.2)

## 2020-10-31 LAB — RESP PANEL BY RT-PCR (FLU A&B, COVID) ARPGX2
Influenza A by PCR: NEGATIVE
Influenza B by PCR: NEGATIVE
SARS Coronavirus 2 by RT PCR: NEGATIVE

## 2020-10-31 LAB — LACTIC ACID, PLASMA: Lactic Acid, Venous: 1.9 mmol/L (ref 0.5–1.9)

## 2020-10-31 MED ORDER — LEVOTHYROXINE SODIUM 88 MCG PO TABS
88.0000 ug | ORAL_TABLET | Freq: Every day | ORAL | Status: DC
  Administered 2020-11-01 – 2020-11-03 (×2): 88 ug via ORAL
  Filled 2020-10-31 (×3): qty 1

## 2020-10-31 MED ORDER — SODIUM CHLORIDE 0.9 % IV SOLN
1.0000 g | Freq: Four times a day (QID) | INTRAVENOUS | Status: DC
  Filled 2020-10-31: qty 1000

## 2020-10-31 MED ORDER — MIRABEGRON ER 25 MG PO TB24
25.0000 mg | ORAL_TABLET | Freq: Every day | ORAL | Status: DC
  Administered 2020-10-31 – 2020-11-03 (×4): 25 mg via ORAL
  Filled 2020-10-31 (×4): qty 1

## 2020-10-31 MED ORDER — POLYETHYLENE GLYCOL 3350 17 G PO PACK
17.0000 g | PACK | Freq: Every day | ORAL | Status: DC
  Administered 2020-10-31 – 2020-11-03 (×3): 17 g via ORAL
  Filled 2020-10-31 (×3): qty 1

## 2020-10-31 MED ORDER — ONDANSETRON HCL 4 MG/2ML IJ SOLN: 4.0000 mg | Freq: Four times a day (QID) | INTRAMUSCULAR | Status: DC | PRN

## 2020-10-31 MED ORDER — ASPIRIN EC 81 MG PO TBEC
81.0000 mg | DELAYED_RELEASE_TABLET | Freq: Every day | ORAL | Status: DC
  Administered 2020-10-31 – 2020-11-03 (×4): 81 mg via ORAL
  Filled 2020-10-31 (×4): qty 1

## 2020-10-31 MED ORDER — LATANOPROST 0.005 % OP SOLN
1.0000 [drp] | Freq: Every day | OPHTHALMIC | Status: DC
  Administered 2020-10-31 – 2020-11-02 (×3): 1 [drp] via OPHTHALMIC
  Filled 2020-10-31: qty 2.5

## 2020-10-31 MED ORDER — ACETAMINOPHEN 325 MG PO TABS: 650.0000 mg | ORAL_TABLET | Freq: Four times a day (QID) | ORAL | Status: DC | PRN

## 2020-10-31 MED ORDER — SODIUM CHLORIDE 0.9 % IV SOLN
2.0000 g | Freq: Three times a day (TID) | INTRAVENOUS | Status: DC
  Administered 2020-10-31 (×2): 2 g via INTRAVENOUS
  Filled 2020-10-31 (×3): qty 2000

## 2020-10-31 MED ORDER — DORZOLAMIDE HCL-TIMOLOL MAL 2-0.5 % OP SOLN
1.0000 [drp] | Freq: Two times a day (BID) | OPHTHALMIC | Status: DC
  Administered 2020-10-31 – 2020-11-03 (×5): 1 [drp] via OPHTHALMIC
  Filled 2020-10-31: qty 10

## 2020-10-31 MED ORDER — ONDANSETRON HCL 4 MG PO TABS: 4.0000 mg | ORAL_TABLET | Freq: Four times a day (QID) | ORAL | Status: DC | PRN

## 2020-10-31 MED ORDER — BISACODYL 10 MG RE SUPP
10.0000 mg | Freq: Once | RECTAL | Status: AC
  Administered 2020-10-31: 10 mg via RECTAL
  Filled 2020-10-31: qty 1

## 2020-10-31 MED ORDER — ATORVASTATIN CALCIUM 80 MG PO TABS
80.0000 mg | ORAL_TABLET | Freq: Every day | ORAL | Status: DC
  Administered 2020-10-31 – 2020-11-03 (×4): 80 mg via ORAL
  Filled 2020-10-31 (×4): qty 1

## 2020-10-31 MED ORDER — APIXABAN 5 MG PO TABS
5.0000 mg | ORAL_TABLET | Freq: Two times a day (BID) | ORAL | Status: DC
  Administered 2020-10-31 – 2020-11-03 (×8): 5 mg via ORAL
  Filled 2020-10-31 (×8): qty 1

## 2020-10-31 MED ORDER — METOPROLOL SUCCINATE ER 50 MG PO TB24
50.0000 mg | ORAL_TABLET | Freq: Every day | ORAL | Status: DC
  Administered 2020-10-31 – 2020-11-03 (×3): 50 mg via ORAL
  Filled 2020-10-31 (×4): qty 1

## 2020-10-31 MED ORDER — SERTRALINE HCL 50 MG PO TABS
50.0000 mg | ORAL_TABLET | Freq: Every day | ORAL | Status: DC
  Administered 2020-10-31 – 2020-11-03 (×4): 50 mg via ORAL
  Filled 2020-10-31 (×4): qty 1

## 2020-10-31 MED ORDER — ACETAMINOPHEN 650 MG RE SUPP: 650.0000 mg | Freq: Four times a day (QID) | RECTAL | Status: DC | PRN

## 2020-10-31 MED ORDER — BRIMONIDINE TARTRATE 0.2 % OP SOLN
1.0000 [drp] | Freq: Two times a day (BID) | OPHTHALMIC | Status: DC
  Administered 2020-10-31 – 2020-11-03 (×5): 1 [drp] via OPHTHALMIC
  Filled 2020-10-31: qty 5

## 2020-10-31 MED ORDER — SODIUM CHLORIDE 0.9 % IV SOLN: Freq: Once | INTRAVENOUS | Status: AC

## 2020-10-31 MED ORDER — SODIUM CHLORIDE 0.9 % IV SOLN
2.0000 g | Freq: Four times a day (QID) | INTRAVENOUS | Status: DC
  Administered 2020-10-31 – 2020-11-01 (×2): 2 g via INTRAVENOUS
  Filled 2020-10-31 (×3): qty 2000

## 2020-10-31 MED ORDER — SODIUM CHLORIDE 0.9 % IV BOLUS
1000.0000 mL | Freq: Once | INTRAVENOUS | Status: AC
  Administered 2020-10-31: 1000 mL via INTRAVENOUS

## 2020-10-31 NOTE — H&P (Signed)
History and Physical    Yvonne Bailey YIR:[id number] DOB: Sep 13, 1940 DOA: 10/30/2020  PCP: Binnie Rail, MD  Patient coming from: Home  I have personally briefly reviewed patient's old medical records in [place]  Chief Complaint: Positive BC  HPI: Yvonne Bailey is a 80 y.o. female with medical history significant of HTN, PAF on eliquis, CKD 3.  Pt seen in ED yesterday, had BC collected as part of work up for UTI symptoms, discharged on Keflex.  BC has come back 2/4 bottles positive for enterococcus.  Pt called and returned to ED.  Pt only having intermittent abd discomfort similar to last night, no fevers, no vomiting.   ED Course: WBC 13.7k, lactate 2.6, improved to 1.9 after 1L bolus.  Creat 1.7 up from 1.4 yesterday.  Started on ampicillin IV.   Review of Systems: As per HPI, otherwise all review of systems negative.  Past Medical History:  Diagnosis Date   Bilateral leg edema 07/19/2015   CAD (coronary artery disease)    a. anterior MI s/p PCI in 1992. b. cath 08/2015 with severe three-vessel CAD turned down for CABG and underwent DESx5 to prox Cx/OM2/D1/oRCA/mRCA   Carotid artery disease ([place])    a. carotid duplex 03/2015 showed 1-39% BICA, normal subclavian arteries, chronically occluded left vertebral, f/u recommended only PRN.   DVT (deep venous thrombosis) ([place])    X1   History of nuclear stress test    a. Myoview 6/17: EF 20-25%, mid anteroseptal, apical anterior, apical septal, apical inferior, apical lateral and apical scar, no ischemia, intermediate risk   HTN (hypertension)    Hyperlipidemia    Hypokalemia    Hypothyroidism    Ischemic cardiomyopathy    a. Echo 6/17: EF 20-25%, apex appears akinetic, MAC, moderate MR, moderate LAE, mild RVE, trivial PI, PASP 47 mmHg (needs repeat with Definity contrast).  b. Limited echo with Definity contrast 7/17: EF 25-30%, moderate to severe LAE. c. Limited Echo 2018 showed EF 40-45%.   Lacunar  infarction ([place]) 12/17/2012   Dr Krista Blue, Neurology    Leukocytosis    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   Longstanding persistent atrial fibrillation (HCC)    MI (myocardial infarction) ([place]) 1992   PAD (peripheral artery disease) (HCC)    Right SFA occlusion, severe disease left CFA and SFA   Prediabetes 07/28/2016   Stage 3 chronic kidney disease ([place])    Stroke ([place])    a. 02/2017 in setting of noncompliance with Eliquis   TIA (transient ischemic attack)    Tricuspid regurgitation     Past Surgical History:  Procedure Laterality Date   APPENDECTOMY     at hysterectomy and USO for fibroids, Dr. Ysidro Evert   CARDIAC CATHETERIZATION  1992   Dr Eustace Quail   CARDIAC CATHETERIZATION N/A 09/06/2015   Procedure: Right/Left Heart Cath and Coronary Angiography;  Surgeon: Burnell Blanks, MD;  Location: [facility];  Service: Cardiovascular;  Laterality: N/A;   CARDIAC CATHETERIZATION N/A 09/07/2015   Procedure: Coronary Stent Intervention;  Surgeon: Burnell Blanks, MD;  Location: [facility];  Service: Cardiovascular;  Laterality: N/A;   COLONOSCOPY     negative; 2008, Dr. Delfin Edis   fracture LLE     '94; pinned   IR ANGIO INTRA EXTRACRAN SEL COM CAROTID INNOMINATE BILAT MOD SED  03/02/2017   IR ANGIO INTRA EXTRACRAN SEL COM CAROTID INNOMINATE BILAT MOD SED  04/23/2018   IR ANGIO VERTEBRAL SEL SUBCLAVIAN INNOMINATE BILAT  MOD SED  04/23/2018   IR ANGIO VERTEBRAL SEL VERTEBRAL UNI R MOD SED  03/02/2017   IR RADIOLOGIST EVAL & MGMT  04/28/2017   IR US GUIDE VASC ACCESS RIGHT  04/23/2018   RADIOLOGY WITH ANESTHESIA N/A 03/02/2017   Procedure: RADIOLOGY WITH ANESTHESIA;  Surgeon: Luanne Bras, MD;  Location: [facility];  Service: Radiology;  Laterality: N/A;   TONSILLECTOMY     TOTAL ABDOMINAL HYSTERECTOMY     & BSO for fibroids     reports that she quit smoking about 30 years ago. She has never used smokeless tobacco. She reports that she does not drink alcohol  and does not use drugs.  Allergies  Allergen Reactions   Definity [Perflutren Lipid Microsphere] Other (See Comments)    Patient experienced back pain with injection   Cilostazol Other (See Comments)     Pletal :" head felt funny"    Family History  Problem Relation Age of Onset   Diabetes Mother    Hypertension Mother    Stroke Brother        ?> 54   Coronary artery disease Brother        stent in 28s   Cancer Neg Hx      Prior to Admission medications   Medication Sig Start Date End Date Taking? Authorizing Provider  apixaban (ELIQUIS) 5 MG TABS tablet Take 1 tablet (5 mg total) by mouth 2 (two) times daily. 07/27/20  Yes Burnell Blanks, MD  levothyroxine (SYNTHROID) 88 MCG tablet TAKE 1 TABLET(88 MCG) BY MOUTH DAILY Patient taking differently: Take 88 mcg by mouth daily before breakfast. 08/04/20  Yes Burns, Claudina Lick, MD  amLODipine (NORVASC) 2.5 MG tablet TAKE 1 TABLET(2.5 MG) BY MOUTH DAILY. FOLLOW-UP Patient taking differently: Take 2.5 mg by mouth daily. 10/09/20   Binnie Rail, MD  aspirin EC 81 MG tablet Take 1 tablet (81 mg total) by mouth daily. 04/21/17   Imogene Burn, PA-C  atorvastatin (LIPITOR) 80 MG tablet TAKE 1 TABLET(80 MG) BY MOUTH DAILY Patient taking differently: Take 80 mg by mouth daily. 06/26/20   Burns, Claudina Lick, MD  benazepril (LOTENSIN) 20 MG tablet TAKE 1 AND 1/2 TABLETS(30 MG) BY MOUTH DAILY Patient taking differently: Take 30 mg by mouth daily. 03/23/20   Binnie Rail, MD  brimonidine (ALPHAGAN) 0.2 % ophthalmic solution Place 1 drop into both eyes 3 (three) times daily.  12/24/17   [provider]  dorzolamide-timolol (COSOPT) 22.3-6.8 MG/ML ophthalmic solution Place 1 drop into both eyes 2 (two) times daily.     [provider]  furosemide (LASIX) 20 MG tablet TAKE 1 TABLET(20 MG) BY MOUTH DAILY Patient taking differently: Take 20 mg by mouth daily. 09/26/20   Imogene Burn, PA-C  metoprolol succinate (TOPROL-XL) 50 MG  24 hr tablet TAKE 1 TABLET(50 MG) BY MOUTH DAILY Patient taking differently: Take 50 mg by mouth daily. 08/09/20   Burnell Blanks, MD  MYRBETRIQ 25 MG TB24 tablet TAKE 1 TABLET(25 MG) BY MOUTH DAILY Patient taking differently: Take 25 mg by mouth daily. 09/05/20   Binnie Rail, MD  nitroGLYCERIN (NITROSTAT) 0.4 MG SL tablet Place 1 tablet (0.4 mg total) under the tongue every 5 (five) minutes as needed for chest pain. 09/08/15   Arbutus Leas, NP  nystatin-triamcinolone ointment [facility]) Apply 1 application topically 2 (two) times daily. 08/29/20   Hoyt Koch, MD  polyethylene glycol (MIRALAX) 17 g packet Take one dose 3 times a  day until bowel clear, maximum of 3 consecutive days. 10/30/20   Charlann Lange, PA-C  sertraline (ZOLOFT) 50 MG tablet Take 1 tablet (50 mg total) by mouth daily. 05/04/20   Binnie Rail, MD  spironolactone (ALDACTONE) 25 MG tablet TAKE 1 TABLET(25 MG) BY MOUTH DAILY Patient taking differently: Take 25 mg by mouth daily. 08/04/20   Binnie Rail, MD  travoprost, benzalkonium, (TRAVATAN) 0.004 % ophthalmic solution Place 1 drop into both eyes at bedtime.    [provider]    Physical Exam: Vitals:   10/30/20 2045 10/30/20 2235 10/30/20 2350 10/31/20 0015  BP: 95/67 126/73 129/63 137/77  Pulse: 86 86 63 74  Resp: _0 Temp:      TempSrc:      SpO2: 97% 97% 97% 99%    Constitutional: NAD, calm, comfortable Eyes: PERRL, lids and conjunctivae normal ENMT: Mucous membranes are moist. Posterior pharynx clear of any exudate or lesions.Normal dentition.  Neck: normal, supple, no masses, no thyromegaly Respiratory: clear to auscultation bilaterally, no wheezing, no crackles. Normal respiratory effort. No accessory muscle use.  Cardiovascular: Regular rate and rhythm, no murmurs / rubs / gallops. No extremity edema. 2+ pedal pulses. No carotid bruits.  Abdomen: no tenderness, no masses palpated. No hepatosplenomegaly. Bowel sounds  positive.  Musculoskeletal: no clubbing / cyanosis. No joint deformity upper and lower extremities. Good ROM, no contractures. Normal muscle tone.  Skin: no rashes, lesions, ulcers. No induration Neurologic: CN 2-12 grossly intact. Sensation intact, DTR normal. Strength 5/5 in all 4.  Psychiatric: Normal judgment and insight. Alert and oriented x 3. Normal mood.    Labs on Admission: I have personally reviewed following labs and imaging studies  CBC: Recent Labs  Lab 10/29/20 2351 10/30/20 1928  WBC 13.7* 13.7*  NEUTROABS 11.1* 10.6*  HGB 13.1 12.5  HCT 40.8 39.7  MCV 102.0* 102.6*  PLT 238 841   Basic Metabolic Panel: Recent Labs  Lab 10/29/20 2351 10/30/20 1928  NA 140 139  K 4.3 4.6  CL 104 101  CO2 25 26  GLUCOSE 137* 129*  BUN 30* 36*  CREATININE 1.46* 1.72*  CALCIUM 9.8 9.5   GFR: Estimated Creatinine Clearance: 25.4 mL/min (A) (by C-G formula based on SCr of 1.72 mg/dL (H)). Liver Function Tests: Recent Labs  Lab 10/29/20 2351  AST 26  ALT 18  ALKPHOS 88  BILITOT 1.2  PROT 7.8  ALBUMIN 3.9   Recent Labs  Lab 10/29/20 2351  LIPASE 31   No results for input(s): AMMONIA in the last 168 hours. Coagulation Profile: No results for input(s): INR, PROTIME in the last 168 hours. Cardiac Enzymes: No results for input(s): CKTOTAL, CKMB, CKMBINDEX, TROPONINI in the last 168 hours. BNP (last 3 results) No results for input(s): PROBNP in the last 8760 hours. HbA1C: No results for input(s): HGBA1C in the last 72 hours. CBG: No results for input(s): GLUCAP in the last 168 hours. Lipid Profile: No results for input(s): CHOL, HDL, LDLCALC, TRIG, CHOLHDL, LDLDIRECT in the last 72 hours. Thyroid Function Tests: No results for input(s): TSH, T4TOTAL, FREET4, T3FREE, THYROIDAB in the last 72 hours. Anemia Panel: No results for input(s): VITAMINB12, FOLATE, FERRITIN, TIBC, IRON, RETICCTPCT in the last 72 hours. Urine analysis:    Component Value Date/Time    COLORURINE AMBER (A) 10/30/2020 0215   APPEARANCEUR CLOUDY (A) 10/30/2020 0215   APPEARANCEUR Cloudy (A) 03/27/2017 1332   LABSPEC 1.014 10/30/2020 0215   PHURINE 8.0 10/30/2020 0215  GLUCOSEU NEGATIVE 10/30/2020 0215   GLUCOSEU NEGATIVE 10/17/2020 0915   HGBUR SMALL (A) 10/30/2020 0215   HGBUR negative 10/26/2007 1057   BILIRUBINUR NEGATIVE 10/30/2020 0215   BILIRUBINUR negative 04/14/2018 1447   BILIRUBINUR Negative 03/27/2017 1332   KETONESUR NEGATIVE 10/30/2020 0215   PROTEINUR 100 (A) 10/30/2020 0215   UROBILINOGEN 0.2 10/17/2020 0915   NITRITE NEGATIVE 10/30/2020 0215   LEUKOCYTESUR LARGE (A) 10/30/2020 0215    Radiological Exams on Admission: CT ABDOMEN PELVIS W CONTRAST  Result Date: 10/30/2020 CLINICAL DATA:  Abdominal pain. EXAM: CT ABDOMEN AND PELVIS WITH CONTRAST TECHNIQUE: Multidetector CT imaging of the abdomen and pelvis was performed using the standard protocol following bolus administration of intravenous contrast. CONTRAST:  39m OMNIPAQUE IOHEXOL 300 MG/ML  SOLN COMPARISON:  None. FINDINGS: Lower chest: Small focus of calcification along the right hemidiaphragm. The visualized lung bases are otherwise clear. There is mild cardiomegaly. Coronary vascular calcification. No intra-abdominal free air or free fluid. Hepatobiliary: The liver is unremarkable. No intrahepatic biliary dilatation. The gallbladder is unremarkable. Pancreas: Unremarkable. No pancreatic ductal dilatation or surrounding inflammatory changes. Spleen: Normal in size without focal abnormality. Adrenals/Urinary Tract: Left adrenal thickening. The right adrenal glands unremarkable. There is a 15 mm stone in the upper pole of the right kidney. An 8 mm calculus noted in the inferior pole of the right kidney. There is a 3.5 cm cyst in the inferior pole of the right kidney. Subcentimeter left renal hypodense lesion is too small to characterize, likely cysts. There is no hydronephrosis on either side. There is  symmetric enhancement and excretion of contrast by both kidneys. The visualized ureters and the urinary bladder appear unremarkable. Stomach/Bowel: There is severe sigmoid diverticulosis. Large stool noted in the rectal vault. There is no bowel obstruction or active inflammation. Vascular/Lymphatic: Advanced aortoiliac atherosclerotic disease. The IVC is unremarkable. No portal venous gas. There is no adenopathy. Reproductive: Hysterectomy. Other: None Musculoskeletal: Osteopenia with degenerative changes of the spine and hips. No acute osseous pathology. IMPRESSION: 1. No acute intra-abdominal or pelvic pathology. 2. Severe sigmoid diverticulosis. No bowel obstruction. 3. Large fecal matter within the rectal vault may represent impacted stool. 4. Nonobstructing right renal calculi. No hydronephrosis. 5. Aortic Atherosclerosis (ICD10-I70.0). Electronically Signed   By: AAnner CreteM.D.   On: 10/30/2020 02:56    EKG: Independently reviewed.  Assessment/Plan Principal Problem:   Bacteremia due to Enterococcus Active Problems:   Essential hypertension   Atrial fibrillation (HCC)   Stage 3 chronic kidney disease (HCC)   Acute cystitis   AKI (acute kidney injury) ([place]    Bacteremia due to enterococcus - Suspect urinary source Repeat BCx UCx [facility] IV ampicillin (sensitivities pending currently) If repeat blood cultures also positive, may warrant ID consult to r/o endocarditis. AKI on CKD 3 - Hold diuretics Hold ACEi Gentle hydration Repeat BMP in AM UTI - Ampicillin and culture as above HTN - Holding ACEi and diuretics Hold norvasc for the moment as BP as low as 923Fsystolic earlier, now 1573UContinue metoprolol (mostly for rate control of A.Fib) AF - Cont metoprolol for rate control Cont eliquis  DVT prophylaxis: eliquis Code Status: Full Family Communication: No family in room Disposition Plan: Home after treatment for bacteremia Consults called: None Ad[place]:  [place] likely to convert to IP later today   Lalo Tromp M. DO [facility]  How to contact the [facility] HospitalAttending or Consulting provider [place] covering provider during after hours 7P -7A,  for this patient?  Check the care team in [facility] and look for a) attending/consulting TR[facility] provider listed and b) the [facility] team listed Log into [email or web address]  Amion Physician Scheduling and messaging for groups and whole hospitals  On call and physician scheduling software for group practices, residents, hospitalists and other medical providers for call, clinic, rotation and shift schedules. On[employer] is a hospital-wide system for scheduling doctors and paging doctors on call. EasyPlot is for scientific plotting and data analysis.  [email or web address]  and use [place]'s universal password to access. If you do not have the password, please contact the hospital operator.  Locate the [name] provider you are looking for under [facility] and page to a number that you can be directly reached. If you still have difficulty reaching the provider, please page the [facility] (Director on Call) for the Hospitalists listed on amion for assistance.  10/31/2020, 1:09 AM

## 2020-10-31 NOTE — Progress Notes (Signed)
Pt seen and examined, admitted earlier this morning by Dr. Julian Reil. Yvonne Bailey is a 80/F with medical history of paroxysmal atrial fibrillation on Eliquis, CAD, ischemic cardiomyopathy with EF of 20 to 25%, improved to 40-45%, CKD 3 AAA was seen in the ER with lower abdominal discomfort, she was started on oral Keflex for presumed UTI and discharged home, had blood cultures drawn at the time. -On 8/22 evening she was asked to come back to the ER due to positive blood cultures growing Enterococcus faecalis, continue to have some intermittent lower abdominal discomfort  Enterococcus faecalis bacteremia -Presumed to be secondary to UTI -Urine cultures from 8/21 with> 100 K GNR, follow-up sensitivities -Repeat blood cultures are pending -Continue IV ampicillin -CT abdomen pelvis is unremarkable for acute findings  Constipation, fecal impaction -Add laxatives, Dulcolax suppository  AKI, CKD 3 -creatinine improving -Hold ACE inhibitor and diuretics  Paroxysmal atrial fibrillation -In sinus rhythm, continue metoprolol and Eliquis  History of chronic systolic CHF/ischemic cardiomyopathy -Resume Aldactone in 1 to 2 days  History of CAD/PCI multiple stents -Stable, continue aspirin, statin, metoprolol  Memory/cognitive deficits  Zannie Cove, MD

## 2020-10-31 NOTE — Progress Notes (Signed)
PHARMACY NOTE:  ANTIMICROBIAL RENAL DOSAGE ADJUSTMENT  Current antimicrobial regimen includes a mismatch between antimicrobial dosage and estimated renal function.  As per policy approved by the Pharmacy & Therapeutics and Medical Executive Committees, the antimicrobial dosage will be adjusted accordingly.  Current antimicrobial dosage:  Ampicililn 2 gm IV Q 8 hours   Indication: Enterococcal bacteremia   Renal Function:  Estimated Creatinine Clearance: 32.1 mL/min (A) (by C-G formula based on SCr of 1.36 mg/dL (H)). []      On intermittent HD, scheduled: []      On CRRT    Antimicrobial dosage has been changed to:  Ampicillin 2 gm IV Q 6 hours   Additional comments: Renal function improving    Thank you for allowing pharmacy to be a part of this patient's care.  , PharmD, BCPS, BCIDP Infectious Diseases Clinical Pharmacist Phone: [phone] 10/31/2020 3:39 PM

## 2020-10-31 NOTE — Consult Note (Signed)
[facility]  Total days of antibiotics 2         Reason for Consult:enterococcal bacteremia    Referring Physician: Broadus [name]  Principal Problem:   Bacteremia due to Enterococcus Active Problems:   Essential hypertension   Atrial fibrillation (HCC)   Stage 3 chronic kidney disease ([place])   Acute cystitis   AKI (acute kidney injury) ([place])    HPI: Yvonne Bailey is a 80 y.o. female with dementia, HTN, CAD s/p CABG, Afib, CKD 3 admitted for abdominal pain, found to have leukocytosis, 13K, mild acute on chronic disease, with UA+ LE and bactiuria. Blood cx grew enterococcus thus was admitted and started on ampicillin. She reports feeling better, she has remained afebrile. Denies abdominal pain or dysuria. Possible source include urinary source., she denies diarrhea.  Past Medical History:  Diagnosis Date   Bilateral leg edema 07/19/2015   CAD (coronary artery disease)    a. anterior MI s/p PCI in 1992. b. cath 08/2015 with severe three-vessel CAD turned down for CABG and underwent DESx5 to prox Cx/OM2/D1/oRCA/mRCA   Carotid artery disease ([place])    a. carotid duplex 03/2015 showed 1-39% BICA, normal subclavian arteries, chronically occluded left vertebral, f/u recommended only PRN.   DVT (deep venous thrombosis) ([place])    X1   History of nuclear stress test    a. Myoview 6/17: EF 20-25%, mid anteroseptal, apical anterior, apical septal, apical inferior, apical lateral and apical scar, no ischemia, intermediate risk   HTN (hypertension)    Hyperlipidemia    Hypokalemia    Hypothyroidism    Ischemic cardiomyopathy    a. Echo 6/17: EF 20-25%, apex appears akinetic, MAC, moderate MR, moderate LAE, mild RVE, trivial PI, PASP 47 mmHg (needs repeat with Definity contrast).  b. Limited echo with Definity contrast 7/17: EF 25-30%, moderate to severe LAE. c. Limited Echo 2018 showed EF 40-45%.   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 02/2017 in setting of noncompliance with Eliquis   TIA (transient ischemic attack)    Tricuspid regurgitation     Allergies:  Allergies  Allergen Reactions   Definity [Perflutren Lipid Microsphere] Other (See Comments)    Patient experienced back pain with injection   Cilostazol Other (See Comments)     Pletal :" head felt funny"    Current antibiotics:   MEDICATIONS:  apixaban  5 mg Oral BID   aspirin EC  81 mg Oral Daily   atorvastatin  80 mg Oral Daily   brimonidine  1 drop Both Eyes BID   dorzolamide-timolol  1 drop Both Eyes BID   latanoprost  1 drop Both Eyes QHS   levothyroxine  88 mcg Oral QAC breakfast   metoprolol succinate  50 mg Oral Daily   mirabegron ER  25 mg Oral Daily   polyethylene glycol  17 g Oral Daily   sertraline  50 mg Oral Daily    Social History   Tobacco Use   Smoking status: Former    Types: Cigarettes    Quit date: 03/11/1990    Years since  quitting: 30.6   Smokeless tobacco: Never   Tobacco comments:    smoked 1973- 1992, up to < 5 cigarettes  Vaping Use   Vaping Use: Never used  Substance Use Topics   Alcohol use: No   Drug use: No    Family History  Problem Relation Age of Onset   Diabetes Mother    Hypertension Mother    Stroke Brother        ?> 38   Coronary artery disease Brother        stent in 70s   Cancer Neg Hx     Review of Systems -   Constitutional: Negative for fever, chills, diaphoresis, activity change, appetite change, fatigue and unexpected weight change.  HENT: Negative for congestion, sore throat, rhinorrhea, sneezing, trouble swallowing and sinus pressure.  Eyes: Negative for photophobia and  visual disturbance.  Respiratory: Negative for cough, chest tightness, shortness of breath, wheezing and stridor.  Cardiovascular: Negative for chest pain, palpitations and leg swelling.  Gastrointestinal: + abd pain. Negative for nausea, vomiting, diarrhea, constipation, blood in stool, abdominal distention and anal bleeding.  Genitourinary: Negative for dysuria, hematuria, flank pain and difficulty urinating.  Musculoskeletal: Negative for myalgias, back pain, joint swelling, arthralgias and gait problem.  Skin: Negative for color change, pallor, rash and wound.  Neurological: Negative for dizziness, tremors, weakness and light-headedness.  Hematological: Negative for adenopathy. Does not bruise/bleed easily.  Psychiatric/Behavioral: Negative for behavioral problems, confusion, sleep disturbance, dysphoric mood, decreased concentration and agitation.     OBJECTIVE: Temp:  [98 F (36.7 C)-98.3 F (36.8 C)] 98.3 F (36.8 C) (08/23 1017) Pulse Rate:  [57-92] 79 (08/23 1017) Resp:  [15-26] 18 (08/23 1017) BP: (95-137)/(62-79) 103/76 (08/23 1017) SpO2:  [93 %-100 %] 100 % (08/23 1017) Physical Exam  Constitutional:  oriented to person, place, and time. appears well-developed and well-nourished. No distress.  HENT: [place]/AT, PERRLA, no scleral icterus; missing top teeth Mouth/Throat: Oropharynx is clear and moist. No oropharyngeal exudate.  Cardiovascular: Normal rate, regular rhythm and normal heart sounds. Exam reveals no gallop and no friction rub.  No murmur heard.  Pulmonary/Chest: Effort normal and breath sounds normal. No respiratory distress.  has no wheezes.  Neck = supple, no nuchal rigidity Abdominal: Soft. Bowel sounds are normal.  exhibits no distension. There is no tenderness.  Lymphadenopathy: no cervical adenopathy. No axillary adenopathy Neurological: alert and oriented to person, place, and time.  Skin: Skin is warm and dry. No rash noted. No erythema.  Psychiatric: a  normal mood and affect.  behavior is normal.    LABS: Results for orders placed or performed during the hospital encounter of 10/30/20 (from the past 48 hour(s))  CBC with Differential     Status: Abnormal   Collection Time: 10/30/20  7:28 PM  Result Value Ref Range   WBC 13.7 (H) 4.0 - 10.5 K/uL   RBC 3.87 3.87 - 5.11 MIL/uL   Hemoglobin 12.5 12.0 - 15.0 g/dL   HCT 39.7 36.0 - 46.0 %   MCV 102.6 (H) 80.0 - 100.0 fL   MCH 32.3 26.0 - 34.0 pg   MCHC 31.5 30.0 - 36.0 g/dL   RDW 13.3 11.5 - 15.5 %   Platelets 249 150 - 400 K/uL   nRBC 0.0 0.0 - 0.2 %   Neutrophils Relative % 75 %   Neutro Abs 10.6 (H) 1.7 - 7.7 K/uL   Lymphocytes Relative 14 %   Lymphs Abs 1.9 0.7 - 4.0 K/uL   Monocytes  Relative 8 %   Monocytes Absolute 1.0 0.1 - 1.0 K/uL   Eosinophils Relative 1 %   Eosinophils Absolute 0.1 0.0 - 0.5 K/uL   Basophils Relative 1 %   Basophils Absolute 0.1 0.0 - 0.1 K/uL   Immature Granulocytes 1 %   Abs Immature Granulocytes 0.07 0.00 - 0.07 K/uL    Comment: Performed at [facility] [address], [place], [place] [address]  Basic metabolic panel     Status: Abnormal   Collection Time: 10/30/20  7:28 PM  Result Value Ref Range   Sodium 139 135 - 145 mmol/L   Potassium 4.6 3.5 - 5.1 mmol/L   Chloride 101 98 - 111 mmol/L   CO2 26 22 - 32 mmol/L   Glucose, Bld 129 (H) 70 - 99 mg/dL    Comment: Glucose reference range applies only to samples taken after fasting for at least 8 hours.   BUN 36 (H) 8 - 23 mg/dL   Creatinine, Ser 1.72 (H) 0.44 - 1.00 mg/dL   Calcium 9.5 8.9 - 10.3 mg/dL   GFR, Estimated 30 (L) >60 mL/min    Comment: (NOTE) Calculated using the CKD-EPI Creatinine Equation (2021)    Anion gap 12 5 - 15    Comment: Performed at [facility] [address] [place], [place] [address]  Lactic acid, plasma     Status: Abnormal   Collection Time: 10/30/20  7:28 PM  Result Value Ref Range   Lactic Acid, Venous 2.6 (HH) 0.5 - 1.9 mmol/L    Comment:  CRITICAL VALUE NOTED.  VALUE IS CONSISTENT WITH PREVIOUSLY REPORTED AND CALLED VALUE. Performed at [facility], [place] [address], [place], [place] [address]   Lactic acid, plasma     Status: None   Collection Time: 10/30/20 10:50 PM  Result Value Ref Range   Lactic Acid, Venous 1.9 0.5 - 1.9 mmol/L    Comment: Performed at [facility] [address], [place], [place] [address]  Resp Panel by RT-PCR (Flu A&B, Covid) Nasopharyngeal Swab     Status: None   Collection Time: 10/30/20 10:50 PM   Specimen: Nasopharyngeal Swab; Nasopharyngeal(NP) swabs in vial transport medium  Result Value Ref Range   SARS Coronavirus 2 by RT PCR NEGATIVE NEGATIVE    Comment: (NOTE) SARS-CoV-2 target nucleic acids are NOT DETECTED.  The SARS-CoV-2 RNA is generally detectable in upper respiratory specimens during the acute phase of infection. The lowest concentration of SARS-CoV-2 viral copies this assay can detect is 138 copies/mL. A negative result does not preclude SARS-Cov-2 infection and should not be used as the sole basis for treatment or other patient management decisions. A negative result may occur with  improper specimen collection/handling, submission of specimen other than nasopharyngeal swab, presence of viral mutation(s) within the areas targeted by this assay, and inadequate number of viral copies(<138 copies/mL). A negative result must be combined with clinical observations, patient history, and epidemiological information. The expected result is Negative.  Fact Sheet for Patients:  EntrepreneurPulse.com.au  Fact Sheet for Healthcare Providers:  IncredibleEmployment.be  This test is no t yet approved or cleared by the [place] FDA and  has been authorized for detection and/or diagnosis of SARS-CoV-2 by FDA under an Emergency Use Authorization (EUA). This EUA will remain  in effect (meaning this test can be used) for the duration of  the COVID-19 declaration under Section 564(b)(1) of the Act, 21 U.S.C.section 360bbb-3(b)(1), unless the authorization is  terminated  or revoked sooner.       Influenza A by PCR NEGATIVE NEGATIVE   Influenza B by PCR NEGATIVE NEGATIVE    Comment: (NOTE) The Xpert Xpress SARS-CoV-2/FLU/RSV plus assay is intended as an aid in the diagnosis of influenza from Nasopharyngeal swab specimens and should not be used as a sole basis for treatment. Nasal washings and aspirates are unacceptable for Xpert Xpress SARS-CoV-2/FLU/RSV testing.  Fact Sheet for Patients: EntrepreneurPulse.com.au  Fact Sheet for Healthcare Providers: IncredibleEmployment.be  This test is not yet approved or cleared by the [place] FDA and has been authorized for detection and/or diagnosis of SARS-CoV-2 by FDA under an Emergency Use Authorization (EUA). This EUA will remain in effect (meaning this test can be used) for the duration of the COVID-19 declaration under Section 564(b)(1) of the Act, 21 U.S.C. section 360bbb-3(b)(1), unless the authorization is terminated or revoked.  Performed at [facility], [place] [address] [place], [place] [address]   CBC     Status: Abnormal   Collection Time: 10/31/20  6:51 AM  Result Value Ref Range   WBC 10.8 (H) 4.0 - 10.5 K/uL   RBC 3.40 (L) 3.87 - 5.11 MIL/uL   Hemoglobin 11.1 (L) 12.0 - 15.0 g/dL   HCT 34.2 (L) 36.0 - 46.0 %   MCV 100.6 (H) 80.0 - 100.0 fL   MCH 32.6 26.0 - 34.0 pg   MCHC 32.5 30.0 - 36.0 g/dL   RDW 13.3 11.5 - 15.5 %   Platelets 200 150 - 400 K/uL   nRBC 0.0 0.0 - 0.2 %    Comment: Performed at [facility], [place] [address] [place], [place] [address]  Basic metabolic panel     Status: Abnormal   Collection Time: 10/31/20  6:51 AM  Result Value Ref Range   Sodium 140 135 - 145 mmol/L   Potassium 4.1 3.5 - 5.1 mmol/L   Chloride 108 98 - 111 mmol/L   CO2 25 22 - 32 mmol/L   Glucose, Bld 92 70 -  99 mg/dL    Comment: Glucose reference range applies only to samples taken after fasting for at least 8 hours.   BUN 34 (H) 8 - 23 mg/dL   Creatinine, Ser 1.36 (H) 0.44 - 1.00 mg/dL   Calcium 8.6 (L) 8.9 - 10.3 mg/dL   GFR, Estimated 39 (L) >60 mL/min    Comment: (NOTE) Calculated using the CKD-EPI Creatinine Equation (2021)    Anion gap 7 5 - 15    Comment: Performed at [facility] [address] [place], [place] [address]    MICRO: 8/21: enterococcus IMAGING: CT ABDOMEN PELVIS W CONTRAST  Result Date: 10/30/2020 CLINICAL DATA:  Abdominal pain. EXAM: CT ABDOMEN AND PELVIS WITH CONTRAST TECHNIQUE: Multidetector CT imaging of the abdomen and pelvis was performed using the standard protocol following bolus administration of intravenous contrast. CONTRAST:  55m OMNIPAQUE IOHEXOL 300 MG/ML  SOLN COMPARISON:  None. FINDINGS: Lower chest: Small focus of calcification along the right hemidiaphragm. The visualized lung bases are otherwise clear. There is mild cardiomegaly. Coronary vascular calcification. No intra-abdominal free air or free fluid. Hepatobiliary: The liver is unremarkable. No intrahepatic biliary dilatation. The gallbladder is unremarkable. Pancreas: Unremarkable. No pancreatic ductal dilatation or surrounding inflammatory changes. Spleen: Normal in size without focal abnormality. Adrenals/Urinary Tract: Left adrenal thickening. The right adrenal glands unremarkable. There is a 15 mm stone in the upper pole of the right kidney. An 8 mm calculus  noted in the inferior pole of the right kidney. There is a 3.5 cm cyst in the inferior pole of the right kidney. Subcentimeter left renal hypodense lesion is too small to characterize, likely cysts. There is no hydronephrosis on either side. There is symmetric enhancement and excretion of contrast by both kidneys. The visualized ureters and the urinary bladder appear unremarkable. Stomach/Bowel: There is severe sigmoid diverticulosis. Large  stool noted in the rectal vault. There is no bowel obstruction or active inflammation. Vascular/Lymphatic: Advanced aortoiliac atherosclerotic disease. The IVC is unremarkable. No portal venous gas. There is no adenopathy. Reproductive: Hysterectomy. Other: None Musculoskeletal: Osteopenia with degenerative changes of the spine and hips. No acute osseous pathology. IMPRESSION: 1. No acute intra-abdominal or pelvic pathology. 2. Severe sigmoid diverticulosis. No bowel obstruction. 3. Large fecal matter within the rectal vault may represent impacted stool. 4. Nonobstructing right renal calculi. No hydronephrosis. 5. Aortic Atherosclerosis (ICD10-I70.0). Electronically Signed   By: Anner Crete M.D.   On: 10/30/2020 02:56     Assessment/Plan:  80yo F admitted for enterococcal bacteremia presumed either urinary or GI source. Abd CT shows diverticulosis but no intra-abd abscess or inflammation. Interestingly urine cx growing ecoli and proteus.  - for now, recommend to continue with ampicillin - recommend to get TEE in order to see if has signs for endocarditis, which would impact length of therapy - at this time, would wait to see sensitivities from urine cx, patient appears improving. Do not think she needs change in abtx at this time.  Ckd 3= will renally dose ampicillin. Appears improving with IVF  Abdominal pain = appears improve. Will continue to follow

## 2020-10-31 NOTE — Progress Notes (Signed)
Pharmacy Antibiotic Note  Yvonne Bailey is a 80 y.o. female admitted on 10/30/2020 with  Enterococcus faecalis bacteremia .  Pharmacy has been consulted for Ampicillin dosing.  SCr 1.72 (was ~1.3 1 month ago)  Plan: Ampicillin 2gm IV q8h Will f/u renal function, micro data, and pt's clinical condition     Temp (24hrs), Avg:98.3 F (36.8 C), Min:98 F (36.7 C), Max:98.6 F (37 C)  Recent Labs  Lab 10/29/20 2351 10/30/20 0200 10/30/20 1928 10/30/20 2250  WBC 13.7*  --  13.7*  --   CREATININE 1.46*  --  1.72*  --   LATICACIDVEN 3.0* 2.1* 2.6* 1.9    Estimated Creatinine Clearance: 25.4 mL/min (A) (by C-G formula based on SCr of 1.72 mg/dL (H)).    Allergies  Allergen Reactions   Definity [Perflutren Lipid Microsphere] Other (See Comments)    Patient experienced back pain with injection   Cilostazol Other (See Comments)     Pletal :" head felt funny"    Antimicrobials this admission: 8/22 Ampicillin >>   Microbiology results:  BCx:   UCx:   8/21 BCx (pre-admission): enterococcus faecalis (no resistance)   Thank you for allowing pharmacy to be a part of this patient's care.  Christoper Fabian, PharmD, BCPS Please see amion for complete clinical pharmacist phone list 10/31/2020 12:48 AM

## 2020-10-31 NOTE — Progress Notes (Signed)
New Admission Note:  Arrival Method: Stretcher from ED Mental Orientation:A&Ox4 Telemetry: N/A Assessment: Completed Skin: Intact IV: PIV L FA with IV infusing Pain: 0/10 Tubes: None Safety Measures: Safety Fall Prevention Plan was discussed  Admission: Completed Belongings: Clothing in bag at bedside Unit Orientation: Patient has been oriented to the room, unit, and the staff.  Orders have been reviewed and implemented. Call light has been placed within reach and bed alarm has been activated. Will continue to monitor the patient.  Nancy Marus, RN

## 2020-10-31 NOTE — Plan of Care (Signed)
  Problem: Nutrition: Goal: Adequate nutrition will be maintained Outcome: Progressing   Problem: Elimination: Goal: Will not experience complications related to bowel motility Outcome: Not Progressing   

## 2020-11-01 DIAGNOSIS — B952 Enterococcus as the cause of diseases classified elsewhere: Secondary | ICD-10-CM | POA: Diagnosis not present

## 2020-11-01 DIAGNOSIS — R7881 Bacteremia: Secondary | ICD-10-CM | POA: Diagnosis not present

## 2020-11-01 LAB — BASIC METABOLIC PANEL
Anion gap: 5 (ref 5–15)
BUN: 25 mg/dL — ABNORMAL HIGH (ref 8–23)
CO2: 25 mmol/L (ref 22–32)
Calcium: 8.5 mg/dL — ABNORMAL LOW (ref 8.9–10.3)
Chloride: 109 mmol/L (ref 98–111)
Creatinine, Ser: 1.1 mg/dL — ABNORMAL HIGH (ref 0.44–1.00)
GFR, Estimated: 51 mL/min — ABNORMAL LOW (ref >60)
Glucose, Bld: 97 mg/dL (ref 70–99)
Potassium: 4.2 mmol/L (ref 3.5–5.1)
Sodium: 139 mmol/L (ref 135–145)

## 2020-11-01 LAB — CBC
HCT: 31.8 % — ABNORMAL LOW (ref 36.0–46.0)
Hemoglobin: 10.5 g/dL — ABNORMAL LOW (ref 12.0–15.0)
MCH: 33.2 pg (ref 26.0–34.0)
MCHC: 33 g/dL (ref 30.0–36.0)
MCV: 100.6 fL — ABNORMAL HIGH (ref 80.0–100.0)
Platelets: 196 10*3/uL (ref 150–400)
RBC: 3.16 MIL/uL — ABNORMAL LOW (ref 3.87–5.11)
RDW: 13.2 % (ref 11.5–15.5)
WBC: 11.3 10*3/uL — ABNORMAL HIGH (ref 4.0–10.5)
nRBC: 0 % (ref 0.0–0.2)

## 2020-11-01 MED ORDER — SODIUM CHLORIDE 0.9 % IV SOLN
3.0000 g | Freq: Four times a day (QID) | INTRAVENOUS | Status: DC
  Administered 2020-11-01 – 2020-11-03 (×8): 3 g via INTRAVENOUS
  Filled 2020-11-01 (×9): qty 8

## 2020-11-01 NOTE — Progress Notes (Signed)
    [facility]    Date of Admission:  10/30/2020   Total days of antibiotics 4           ID: Yvonne Bailey is a 80 y.o. female with polymicrobial bacteremia in the setting of abdominal pain Principal Problem:   Bacteremia due to Enterococcus Active Problems:   Essential hypertension   Atrial fibrillation (HCC)   Stage 3 chronic kidney disease (HCC)   Acute cystitis   AKI (acute kidney injury) (HCC)    Subjective: Afebrile;patient reports improvement in abdominal pain  Micro - now polymicrobial  Medications:   apixaban  5 mg Oral BID   aspirin EC  81 mg Oral Daily   atorvastatin  80 mg Oral Daily   brimonidine  1 drop Both Eyes BID   dorzolamide-timolol  1 drop Both Eyes BID   latanoprost  1 drop Both Eyes QHS   levothyroxine  88 mcg Oral QAC breakfast   metoprolol succinate  50 mg Oral Daily   mirabegron ER  25 mg Oral Daily   polyethylene glycol  17 g Oral Daily   sertraline  50 mg Oral Daily    Objective: Vital signs in last 24 hours: Temp:  [97.6 F (36.4 C)-98.7 F (37.1 C)] 97.6 F (36.4 C) (08/24 0909) Pulse Rate:  [64-92] 64 (08/24 0909) Resp:  [16-19] 16 (08/24 0909) BP: (117-120)/(48-65) 117/62 (08/24 0909) SpO2:  [97 %-100 %] 100 % (08/24 0909) Physical Exam  Constitutional:  oriented to person, place, and time. appears well-developed and well-nourished. No distress.  HENT: [place]/AT, PERRLA, no scleral icterus Mouth/Throat: Oropharynx is clear and moist. No oropharyngeal exudate. Partial teeth missing Cardiovascular: Normal rate, regular rhythm and normal heart sounds. Exam reveals no gallop and no friction rub.  No murmur heard.  Pulmonary/Chest: Effort normal and breath sounds normal. No respiratory distress.  has no wheezes.  Neck = supple, no nuchal rigidity Abdominal: Soft. Bowel sounds are normal.  exhibits no distension. There is no tenderness.  Lymphadenopathy: no cervical adenopathy. No axillary  adenopathy Neurological: alert and oriented to person, place, and time.  Skin: Skin is warm and dry. No rash noted. No erythema.  Psychiatric: a normal mood and affect.  behavior is normal.    Lab Results Recent Labs    10/31/20 0651 11/01/20 0338  WBC 10.8* 11.3*  HGB 11.1* 10.5*  HCT 34.2* 31.8*  NA 140 139  K 4.1 4.2  CL 108 109  CO2 25 25  BUN 34* 25*  CREATININE 1.36* 1.10*   Liver Panel Recent Labs    10/29/20 2351  PROT 7.8  ALBUMIN 3.9  AST 26  ALT 18  ALKPHOS 88  BILITOT 1.2   Sedimentation Rate No results for input(s): ESRSEDRATE in the last 72 hours. C-Reactive Protein No results for input(s): CRP in the last 72 hours.  Microbiology: 8/21: blood cx showing enterococcus plus GNR 8/23: blood cx PENDING Studies/Results: No results found.   Assessment/Plan: Enterococcal plus GNR  bacteremia = will plan to switch IV abtx to amp/sub. Await identificaiton. She is going to undergo TEE tomorrow to evaluate for endocarditis to decide if need prolonged course of IV abtx  -source likely GI source since polymicrobial - appears improving  Will provide further recs as micro results return.    [facility] Cell: [phone] Pager: [phone]  11/01/2020, 10:54 AM

## 2020-11-01 NOTE — Progress Notes (Signed)
PROGRESS NOTE  Yvonne Bailey  DOB: 1940/12/30  PCP: Pincus Sanes, MD HWE:[id number]  DOA: 10/30/2020  LOS: 1 day  Hospital Day: 3   Chief complaint: Abdominal discomfort  Brief narrative: Yvonne Bailey is a 80 y.o. female with PMH significant for dementia, HTN, CAD s/p CABG, paroxysmal Afib on Eliquis, ischemic cardiomyopathy with EF 20 to 25%, improved to 40 to 45%, AAA, CKD 3  Patient presented to the ED on 8/22 with complaint of lower abdominal discomfort.  Blood cultures were drawn.  Patient was discharged home on oral Keflex for presumed UTI.  Same evening, she was called back to come to the ED for positive blood cultures growing Enterococcus faecalis.   Admitted to hospital service ID consult was obtained  Subjective: Patient was seen and examined this morning.  Pleasant elderly African-American female.  Lying on bed.  Not in distress.  No new symptoms.  Assessment/Plan: Enterococcus faecalis bacteremia -Presumed to be secondary to UTI -Urine cultures from 8/21 with> 100 K GNR, follow-up sensitivities -Repeat blood cultures sent on 8/23, pending report. -Continue IV ampicillin, per ID recommendation -Abdominal pain improving.  CT abdomen pelvis is unremarkable for acute findings -Pending TEE to see any signs of endocarditis.  I sent a message to cardiology service.  Scheduled for tomorrow.   Constipation, fecal impaction -Continue laxatives   History of chronic systolic CHF/ischemic cardiomyopathy Essential hypertension -Home meds include Toprol 50 mg daily, amlodipine 2.5 mg daily, benazepril 30 mg daily, Lasix 20 mg daily, Aldactone 25 mg daily -Continue metoprolol.  Others on hold at this time.  Blood pressure stable.  Creatinine improving.   AKI, CKD 3 -creatinine improving -Hold ACE inhibitor and diuretics.  Blood pressure stable. Recent Labs    03/28/20 1552 06/07/20 2235 09/08/20 1834 10/17/20 0915 10/29/20 2351 10/30/20 1928 10/31/20 0651  11/01/20 0338  BUN 23 27* 28* 49* 30* 36* 34* 25*  CREATININE 1.09 1.31* 1.37* 2.15* 1.46* 1.72* 1.36* 1.10*   Paroxysmal atrial fibrillation -In sinus rhythm, continue metoprolol and Eliquis   History of CAD/PCI multiple stents -Stable, continue aspirin, statin, metoprolol   Memory/cognitive deficits -Stable at baseline.     Mobility: PT/OT eval Code Status:   Code Status: Full Code  Nutritional status: There is no height or weight on file to calculate BMI.     Diet:  Diet Order             Diet Heart Room service appropriate? Yes; Fluid consistency: Thin  Diet effective now                  DVT prophylaxis:   apixaban (ELIQUIS) tablet 5 mg   Antimicrobials: IV ampicillin Fluid: None Consultants: ID Family Communication: None at bedside  Status is: Inpatient  Remains inpatient appropriate because: On IV antibiotics, needs TEE  Dispo: The patient is from: Home              Anticipated d/c is to: Hopefully home, pending PT eval.  Pending TEE              Patient currently is not medically stable to d/c.   Difficult to place patient No     Infusions:   ampicillin-sulbactam (UNASYN) IV      Scheduled Meds:  apixaban  5 mg Oral BID   aspirin EC  81 mg Oral Daily   atorvastatin  80 mg Oral Daily   brimonidine  1 drop Both Eyes BID   dorzolamide-timolol  1  drop Both Eyes BID   latanoprost  1 drop Both Eyes QHS   levothyroxine  88 mcg Oral QAC breakfast   metoprolol succinate  50 mg Oral Daily   mirabegron ER  25 mg Oral Daily   polyethylene glycol  17 g Oral Daily   sertraline  50 mg Oral Daily    Antimicrobials: Anti-infectives (From admission, onward)    Start     Dose/Rate Route Frequency Ordered Stop   11/01/20 1200  Ampicillin-Sulbactam (UNASYN) 3 g in sodium chloride 0.9 % 100 mL IVPB        3 g 200 mL/hr over 30 Minutes Intravenous Every 6 hours 11/01/20 1006     10/31/20 2200  ampicillin (OMNIPEN) 2 g in sodium chloride 0.9 % 100 mL IVPB   Status:  Discontinued        2 g 300 mL/hr over 20 Minutes Intravenous Every 6 hours 10/31/20 1538 11/01/20 1006   10/31/20 0600  ampicillin (OMNIPEN) 1 g in sodium chloride 0.9 % 100 mL IVPB  Status:  Discontinued        1 g 300 mL/hr over 20 Minutes Intravenous Every 6 hours 10/31/20 0034 10/31/20 0047   10/31/20 0600  ampicillin (OMNIPEN) 2 g in sodium chloride 0.9 % 100 mL IVPB  Status:  Discontinued        2 g 300 mL/hr over 20 Minutes Intravenous Every 8 hours 10/31/20 0047 10/31/20 1538   10/30/20 2245  vancomycin (VANCOREADY) IVPB 1500 mg/300 mL  Status:  Discontinued        1,500 mg 150 mL/hr over 120 Minutes Intravenous  Once 10/30/20 2231 10/30/20 2232   10/30/20 2245  ampicillin (OMNIPEN) 2 g in sodium chloride 0.9 % 100 mL IVPB        2 g 300 mL/hr over 20 Minutes Intravenous  Once 10/30/20 2233 10/30/20 2339       PRN meds: acetaminophen **OR** acetaminophen, ondansetron **OR** ondansetron (ZOFRAN) IV   Objective: Vitals:   10/31/20 2116 11/01/20 0909  BP: (!) 117/48 117/62  Pulse: 78 64  Resp: 18 16  Temp: 98.7 F (37.1 C) 97.6 F (36.4 C)  SpO2: 100% 100%    Intake/Output Summary (Last 24 hours) at 11/01/2020 1028 Last data filed at 11/01/2020 0803 Gross per 24 hour  Intake 700 ml  Output 400 ml  Net 300 ml   There were no vitals filed for this visit. Weight change:  There is no height or weight on file to calculate BMI.   Physical Exam: General exam: Pleasant, elderly African-American female.  Not in distress Skin: No rashes, lesions or ulcers. HEENT: Atraumatic, normocephalic, no obvious bleeding Lungs: Clear to auscultation bilaterally CVS: Regular rate and rhythm, no murmur GI/Abd soft, nontender, nondistended, bowel sound present CNS: Alert, awake and slow to respond, knows she is in the hospital Psychiatry: Mood appropriate Extremities: No pedal edema, no calf tenderness  Data Review: I have personally reviewed the laboratory data and  studies available.  Recent Labs  Lab 10/29/20 2351 10/30/20 1928 10/31/20 0651 11/01/20 0338  WBC 13.7* 13.7* 10.8* 11.3*  NEUTROABS 11.1* 10.6*  --   --   HGB 13.1 12.5 11.1* 10.5*  HCT 40.8 39.7 34.2* 31.8*  MCV 102.0* 102.6* 100.6* 100.6*  PLT 238 249 200 196   Recent Labs  Lab 10/29/20 2351 10/30/20 1928 10/31/20 0651 11/01/20 0338  NA 140 139 140 139  K 4.3 4.6 4.1 4.2  CL 104 101 108 109  CO2  25 26 25 25   GLUCOSE 137* 129* 92 97  BUN 30* 36* 34* 25*  CREATININE 1.46* 1.72* 1.36* 1.10*  CALCIUM 9.8 9.5 8.6* 8.5*    F/u labs ordered Unresulted Labs (From admission, onward)     Start     Ordered   11/02/20 0500  CBC with Differential/Platelet  Daily,   R      11/01/20 1028   11/02/20 0500  Basic metabolic panel  Daily,   R      11/01/20 1028   10/31/20 0033  Urine Culture  Once,   STAT       Comments: Bacteremia of enterococcus   Question:  Indication  Answer:  Dysuria   10/31/20 0034            Signed, 11/02/20, MD [facility] 11/01/2020

## 2020-11-01 NOTE — Plan of Care (Signed)
  Problem: Activity: Goal: Risk for activity intolerance will decrease Outcome: Progressing   Problem: Elimination: Goal: Will not experience complications related to bowel motility Outcome: Progressing   

## 2020-11-01 NOTE — Progress Notes (Addendum)
    [facility] has been requested to perform a transesophageal echocardiogram on Yvonne Bailey for bacteremia  After careful review of history and examination, the risks and benefits of transesophageal echocardiogram have been explained including risks of esophageal damage, perforation (1:10,000 risk), bleeding, pharyngeal hematoma as well as other potential complications associated with conscious sedation including aspiration, arrhythmia, respiratory failure and death. Alternatives to treatment were discussed, questions were answered. Patient denies any trouble swallowing or any history of radiation to the chest. Patient is willing to proceed but patient asked that I also discuss procedure with son [name] who is her healthcare power of attorney. Called and spoke with son who also agreed with procedure. RN Candice confirmed son is in agreement with procedure.  Procedure scheduled for 11/02/2020 at 7:30am with Dr. Royann Shivers. Will place orders.  Corrin Parker, PA-C 11/01/2020 3:46 PM

## 2020-11-01 NOTE — Discharge Instructions (Signed)

## 2020-11-01 NOTE — Evaluation (Signed)
Physical Therapy Evaluation Patient Details Name: Yvonne Bailey MRN: [id number] DOB: [birth year] Today's Date: 11/01/2020   History of Present Illness  Pt is a 80 y.o. F who presents to ED 8/22 with complaints of lower abdominal discomfort. Blood cultures were drawn. Pt was discharged home on oral Keflex for presumed UTI.  Same evening, she was called back to come to the ED for positive blood cultures growing Enterococcus faecalis. Pending TEE to see any signs of endocarditis. Significant PMH: dementia, HTN, CAD s/p CABG, paroxysmal Afib, ischemic cardiomyopathy, AAA, CKD 3.  Clinical Impression  Pt admitted with above. Presents with generalized weakness, decreased balance, and activity tolerance. Pt received with bowel incontinence; required help for peri care in standing position. Pt ambulating 15 ft with a walker at a min guard assist level. Will benefit from HHPT follow up address deficits and maximize functional mobility.     Follow Up Recommendations Home health PT;Supervision/Assistance - 24 hour    Equipment Recommendations  3in1 (PT)    Recommendations for Other Services       Precautions / Restrictions Precautions Precautions: Fall Restrictions Weight Bearing Restrictions: No      Mobility  Bed Mobility Overal bed mobility: Needs Assistance Bed Mobility: Supine to Sit     Supine to sit: Min assist     General bed mobility comments: Assist for initiation    Transfers Overall transfer level: Needs assistance Equipment used: Rolling walker (2 wheeled) Transfers: Sit to/from Stand Sit to Stand: Min assist         General transfer comment: MinA to boost up to standing position  Ambulation/Gait Ambulation/Gait assistance: Min guard Gait Distance (Feet): 15 Feet Assistive device: Rolling walker (2 wheeled) Gait Pattern/deviations: Step-through pattern;Decreased stride length;Trunk flexed Gait velocity: decreased   General Gait Details: Cues for  environmental/obstacle negotiation, min guard for safety  Stairs            Wheelchair Mobility    Modified Rankin (Stroke Patients Only)       Balance Overall balance assessment: Needs assistance Sitting-balance support: Feet supported Sitting balance-Leahy Scale: Good     Standing balance support: Bilateral upper extremity supported Standing balance-Leahy Scale: Poor Standing balance comment: reliant on RW                             Pertinent Vitals/Pain Pain Assessment: No/denies pain    Home Living Family/patient expects to be discharged to:: Private residence Living Arrangements: Other relatives (granddaughter) Available Help at Discharge: Family Type of Home: House Home Access: Stairs to enter Entrance Stairs-Rails: Right Entrance Stairs-Number of Steps: 6 Home Layout: One level Home Equipment: Shower seat;Cane - single point;Walker - 2 wheels      Prior Function Level of Independence: Needs assistance   Gait / Transfers Assistance Needed: uses walker  ADL's / Homemaking Assistance Needed: independent ADL's, does not drive, pt son assists with grocery shopping        Hand Dominance        Extremity/Trunk Assessment   Upper Extremity Assessment Upper Extremity Assessment: Generalized weakness    Lower Extremity Assessment Lower Extremity Assessment: Generalized weakness    Cervical / Trunk Assessment Cervical / Trunk Assessment: Kyphotic  Communication   Communication: No difficulties  Cognition   Behavior During Therapy: WFL for tasks assessed/performed Overall Cognitive Status: History of cognitive impairments - at baseline  General Comments: History of dementia per chart review. Pt following all commands      General Comments      Exercises     Assessment/Plan    PT Assessment Patient needs continued PT services  PT Problem List Decreased strength;Decreased activity  tolerance;Decreased mobility;Decreased balance;Decreased cognition;Decreased safety awareness       PT Treatment Interventions DME instruction;Gait training;Stair training;Functional mobility training;Therapeutic exercise;Therapeutic activities;Balance training;Patient/family education    PT Goals (Current goals can be found in the Care Plan section)  Acute Rehab PT Goals Patient Stated Goal: did not state PT Goal Formulation: With patient Time For Goal Achievement: 11/15/20 Potential to Achieve Goals: Good    Frequency Min 3X/week   Barriers to discharge        Co-evaluation               AM-PAC PT "6 Clicks" Mobility  Outcome Measure Help needed turning from your back to your side while in a flat bed without using bedrails?: A Little Help needed moving from lying on your back to sitting on the side of a flat bed without using bedrails?: A Little Help needed moving to and from a bed to a chair (including a wheelchair)?: A Little Help needed standing up from a chair using your arms (e.g., wheelchair or bedside chair)?: A Little Help needed to walk in hospital room?: A Little Help needed climbing 3-5 steps with a railing? : A Lot 6 Click Score: 17    End of Session Equipment Utilized During Treatment: Gait belt Activity Tolerance: Patient tolerated treatment well Patient left: in chair;with call bell/phone within reach;with chair alarm set Nurse Communication: Mobility status PT Visit Diagnosis: Unsteadiness on feet (R26.81);Muscle weakness (generalized) (M62.81)    Time: 2992-4268 PT Time Calculation (min) (ACUTE ONLY): 26 min   Charges:   PT Evaluation $PT Eval Moderate Complexity: 1 Mod PT Treatments $Therapeutic Activity: 8-22 mins        Lillia Pauls, PT, DPT Acute Rehabilitation Services Pager [phone] Office [phone]   Norval Morton 11/01/2020, 4:20 PM

## 2020-11-01 NOTE — Anesthesia Preprocedure Evaluation (Addendum)
Anesthesia Evaluation  Patient identified by MRN, date of birth, ID band Patient awake    Reviewed: Allergy & Precautions, NPO status , Patient's Chart, lab work & pertinent test results  Airway Mallampati: II  TM Distance: >3 FB Neck ROM: Full    Dental  (+) Edentulous Upper, Missing   Pulmonary neg pulmonary ROS, former smoker,    Pulmonary exam normal        Cardiovascular hypertension, Pt. on medications and Pt. on home beta blockers + CAD, + Past MI (1992), + Cardiac Stents, + Peripheral Vascular Disease, +CHF and + DOE  + dysrhythmias Atrial Fibrillation  Rhythm:Irregular Rate:Normal     Neuro/Psych Anxiety Depression Carotid stenosis  CVA (2018)    GI/Hepatic negative GI ROS, Neg liver ROS,   Endo/Other  Hypothyroidism   Renal/GU CRFRenal disease  negative genitourinary   Musculoskeletal negative musculoskeletal ROS (+)   Abdominal (+)  Abdomen: soft. Bowel sounds: normal.  Peds  Hematology Bacteremia    Anesthesia Other Findings   Reproductive/Obstetrics                           Anesthesia Physical Anesthesia Plan  ASA: 3  Anesthesia Plan: MAC   Post-op Pain Management:    Induction: Intravenous  PONV Risk Score and Plan: 2 and Propofol infusion and Treatment may vary due to age or medical condition  Airway Management Planned: Simple Face Mask, Natural Airway and Nasal Cannula  Additional Equipment: None  Intra-op Plan:   Post-operative Plan:   Informed Consent: I have reviewed the patients History and Physical, chart, labs and discussed the procedure including the risks, benefits and alternatives for the proposed anesthesia with the patient or authorized representative who has indicated his/her understanding and acceptance.     Dental advisory given  Plan Discussed with: CRNA  Anesthesia Plan Comments: (Lab Results      Component                Value                Date                      WBC                      11.3 (H)            11/01/2020                HGB                      10.5 (L)            11/01/2020                HCT                      31.8 (L)            11/01/2020                MCV                      100.6 (H)           11/01/2020                PLT  196                 11/01/2020           Lab Results      Component                Value               Date                      NA                       139                 11/01/2020                K                        4.2                 11/01/2020                CO2                      25                  11/01/2020                GLUCOSE                  97                  11/01/2020                BUN                      25 (H)              11/01/2020                CREATININE               1.10 (H)            11/01/2020                CALCIUM                  8.5 (L)             11/01/2020                GFRNONAA                 51 (L)              11/01/2020                GFRAA                    60                  08/03/2019            ECHO 2017: ECHO 2018 poor acoustic windows, unable to assess LV function -------------------------------------------------------------------  Study Conclusions  - Left ventricle: The cavity size was normal. Wall thickness was  normal. Systolic function was mildly to moderately reduced. The  estimated ejection fraction was in the range of 40%  to 45%.  - Left atrium: The atrium was moderately dilated.  - Tricuspid valve: There was moderate regurgitation.  - Pulmonary arteries: PA peak pressure: 47 mm Hg (S).   Complications: Severe back pain post Definity. )       Anesthesia Quick Evaluation

## 2020-11-01 NOTE — Progress Notes (Signed)
Nutrition Brief Note  Patient identified on the Malnutrition Screening Tool (MST) Report.  Admitting Dx: Bacteremia due to Enterococcus [R78.81, B95.2] PMH:  Past Medical History:  Diagnosis Date   Bilateral leg edema 07/19/2015   CAD (coronary artery disease)    a. anterior MI s/p PCI in 1992. b. cath 08/2015 with severe three-vessel CAD turned down for CABG and underwent DESx5 to prox Cx/OM2/D1/oRCA/mRCA   Carotid artery disease ([place])    a. carotid duplex 03/2015 showed 1-39% BICA, normal subclavian arteries, chronically occluded left vertebral, f/u recommended only PRN.   DVT (deep venous thrombosis) ([place])    X1   History of nuclear stress test    a. Myoview 6/17: EF 20-25%, mid anteroseptal, apical anterior, apical septal, apical inferior, apical lateral and apical scar, no ischemia, intermediate risk   HTN (hypertension)    Hyperlipidemia    Hypokalemia    Hypothyroidism    Ischemic cardiomyopathy    a. Echo 6/17: EF 20-25%, apex appears akinetic, MAC, moderate MR, moderate LAE, mild RVE, trivial PI, PASP 47 mmHg (needs repeat with Definity contrast).  b. Limited echo with Definity contrast 7/17: EF 25-30%, moderate to severe LAE. c. Limited Echo 2018 showed EF 40-45%.   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 02/2017 in setting of noncompliance with Eliquis   TIA (transient ischemic attack)    Tricuspid regurgitation    Medications:  Scheduled Meds:  apixaban  5 mg Oral BID   aspirin EC  81 mg Oral Daily   atorvastatin  80 mg Oral Daily   brimonidine  1 drop Both Eyes BID   dorzolamide-timolol  1 drop Both Eyes BID   latanoprost  1  drop Both Eyes QHS   levothyroxine  88 mcg Oral QAC breakfast   metoprolol succinate  50 mg Oral Daily   mirabegron ER  25 mg Oral Daily   polyethylene glycol  17 g Oral Daily   sertraline  50 mg Oral Daily  Continuous Infusions:  ampicillin-sulbactam (UNASYN) IV 3 g (11/01/20 1155)   Labs: Recent Labs  Lab 10/30/20 1928 10/31/20 0651 11/01/20 0338  NA 139 140 139  K 4.6 4.1 4.2  CL 101 108 109  CO2 _0 BUN 36* 34* 25*  CREATININE 1.72* 1.36* 1.10*  CALCIUM 9.5 8.6* 8.5*  GLUCOSE 129* 92 97    Wt Readings from Last 6 Encounters:  10/29/20 73.9 kg  09/08/20 74 kg  08/29/20 73.3 kg  06/09/20 75.9 kg  06/07/20 79.8 kg  03/28/20 79.8 kg  There is no height or weight on file to calculate BMI. Insignificant 7.39% weight loss x5 months noted.   Current diet order is heart healthy, patient is consuming approximately 75-100% of meals at this time.    No nutrition interventions warranted at this time. If nutrition issues arise, please consult RD.   Larkin Ina, MS, RD, LDN (she/her/hers) RD pager number and weekend/on-call pager number located in [place].

## 2020-11-02 ENCOUNTER — Inpatient Hospital Stay (HOSPITAL_COMMUNITY): Payer: Medicare HMO | Admitting: Anesthesiology

## 2020-11-02 ENCOUNTER — Encounter (HOSPITAL_COMMUNITY)
Admission: EM | Disposition: A | Discharge status: Home / Self Care | Payer: Self-pay | Source: Home / Self Care | Attending: Internal Medicine

## 2020-11-02 ENCOUNTER — Encounter (HOSPITAL_COMMUNITY): Payer: Self-pay | Admitting: Internal Medicine

## 2020-11-02 ENCOUNTER — Inpatient Hospital Stay (HOSPITAL_COMMUNITY): Payer: Medicare HMO

## 2020-11-02 DIAGNOSIS — B952 Enterococcus as the cause of diseases classified elsewhere: Secondary | ICD-10-CM | POA: Diagnosis not present

## 2020-11-02 DIAGNOSIS — R7881 Bacteremia: Secondary | ICD-10-CM | POA: Diagnosis not present

## 2020-11-02 DIAGNOSIS — I351 Nonrheumatic aortic (valve) insufficiency: Secondary | ICD-10-CM

## 2020-11-02 HISTORY — PX: TEE WITHOUT CARDIOVERSION: SHX5443

## 2020-11-02 LAB — CBC WITH DIFFERENTIAL/PLATELET
Abs Immature Granulocytes: 0.03 10*3/uL (ref 0.00–0.07)
Basophils Absolute: 0.1 10*3/uL (ref 0.0–0.1)
Basophils Relative: 1 %
Eosinophils Absolute: 0.2 10*3/uL (ref 0.0–0.5)
Eosinophils Relative: 3 %
HCT: 32.2 % — ABNORMAL LOW (ref 36.0–46.0)
Hemoglobin: 10.3 g/dL — ABNORMAL LOW (ref 12.0–15.0)
Immature Granulocytes: 0 %
Lymphocytes Relative: 27 %
Lymphs Abs: 2.5 10*3/uL (ref 0.7–4.0)
MCH: 32.8 pg (ref 26.0–34.0)
MCHC: 32 g/dL (ref 30.0–36.0)
MCV: 102.5 fL — ABNORMAL HIGH (ref 80.0–100.0)
Monocytes Absolute: 0.9 10*3/uL (ref 0.1–1.0)
Monocytes Relative: 9 %
Neutro Abs: 5.7 10*3/uL (ref 1.7–7.7)
Neutrophils Relative %: 60 %
Platelets: 190 10*3/uL (ref 150–400)
RBC: 3.14 MIL/uL — ABNORMAL LOW (ref 3.87–5.11)
RDW: 13.3 % (ref 11.5–15.5)
WBC: 9.3 10*3/uL (ref 4.0–10.5)
nRBC: 0 % (ref 0.0–0.2)

## 2020-11-02 LAB — BASIC METABOLIC PANEL
Anion gap: 8 (ref 5–15)
BUN: 19 mg/dL (ref 8–23)
CO2: 22 mmol/L (ref 22–32)
Calcium: 8.5 mg/dL — ABNORMAL LOW (ref 8.9–10.3)
Chloride: 108 mmol/L (ref 98–111)
Creatinine, Ser: 0.94 mg/dL (ref 0.44–1.00)
GFR, Estimated: >60 mL/min (ref >60)
Glucose, Bld: 80 mg/dL (ref 70–99)
Potassium: 4.1 mmol/L (ref 3.5–5.1)
Sodium: 138 mmol/L (ref 135–145)

## 2020-11-02 LAB — URINE CULTURE: Culture: >=100000 — AB

## 2020-11-02 SURGERY — ECHOCARDIOGRAM, TRANSESOPHAGEAL
Anesthesia: Monitor Anesthesia Care

## 2020-11-02 MED ORDER — BENAZEPRIL HCL 5 MG PO TABS
30.0000 mg | ORAL_TABLET | Freq: Every day | ORAL | Status: DC
  Administered 2020-11-02 – 2020-11-03 (×2): 30 mg via ORAL
  Filled 2020-11-02 (×3): qty 2

## 2020-11-02 MED ORDER — SODIUM CHLORIDE 0.9 % IV SOLN: INTRAVENOUS | Status: DC

## 2020-11-02 MED ORDER — LIDOCAINE 2% (20 MG/ML) 5 ML SYRINGE
INTRAMUSCULAR | Status: DC | PRN
  Administered 2020-11-02: 40 mg via INTRAVENOUS

## 2020-11-02 MED ORDER — BUTAMBEN-TETRACAINE-BENZOCAINE 2-2-14 % EX AERO
INHALATION_SPRAY | CUTANEOUS | Status: DC | PRN
  Administered 2020-11-02: 1 via TOPICAL

## 2020-11-02 MED ORDER — PROPOFOL 10 MG/ML IV BOLUS
INTRAVENOUS | Status: DC | PRN
  Administered 2020-11-02 (×2): 10 mg via INTRAVENOUS
  Administered 2020-11-02: 20 mg via INTRAVENOUS
  Administered 2020-11-02: 10 mg via INTRAVENOUS

## 2020-11-02 MED ORDER — PROPOFOL 500 MG/50ML IV EMUL
INTRAVENOUS | Status: DC | PRN
  Administered 2020-11-02: 80 ug/kg/min via INTRAVENOUS

## 2020-11-02 MED ORDER — PHENYLEPHRINE 40 MCG/ML (10ML) SYRINGE FOR IV PUSH (FOR BLOOD PRESSURE SUPPORT)
PREFILLED_SYRINGE | INTRAVENOUS | Status: DC | PRN
  Administered 2020-11-02: 80 ug via INTRAVENOUS

## 2020-11-02 NOTE — Evaluation (Signed)
Occupational Therapy Evaluation Patient Details Name: Yvonne Bailey MRN: [id number] DOB: 1940/05/05 Today's Date: 11/02/2020    History of Present Illness Pt is a 80 y.o. F who presents to ED 8/22 with complaints of lower abdominal discomfort. Blood cultures were drawn. Pt was discharged home on oral Keflex for presumed UTI.  Same evening, she was called back to come to the ED for positive blood cultures growing Enterococcus faecalis. TEE negative for endocarditis. Significant PMH: dementia, HTN, CAD s/p CABG, paroxysmal Afib, ischemic cardiomyopathy, AAA, CKD 3.   Clinical Impression   Pt lives with her granddaughter and her son visits frequently. She goes to a senior center during the weekdays. She is modified independent in self care and assisted for IADL and transportation by her family. Pt is unable to see from her L eye due to glaucoma. She demonstrated ability to perform bed mobility with supervision, stood with min assist and ambulated in room with min guard assist. Pt requires set up to min assist for ADL, particularly for LB bathing and dressing. Will follow acutely.    Follow Up Recommendations  No OT follow up;Supervision/Assistance - 24 hour    Equipment Recommendations  None recommended by OT    Recommendations for Other Services       Precautions / Restrictions Precautions Precautions: Fall      Mobility Bed Mobility Overal bed mobility: Needs Assistance Bed Mobility: Supine to Sit;Sit to Supine       Sit to supine: Supervision   General bed mobility comments: HOB up, supervision for IV line    Transfers Overall transfer level: Needs assistance Equipment used: Rolling walker (2 wheeled) Transfers: Sit to/from Stand Sit to Stand: Min assist         General transfer comment: increased time, min assist to rise    Balance Overall balance assessment: Needs assistance Sitting-balance support: Feet supported Sitting balance-Leahy Scale: Good      Standing balance support: Bilateral upper extremity supported Standing balance-Leahy Scale: Poor Standing balance comment: reliant on RW                           ADL either performed or assessed with clinical judgement   ADL Overall ADL's : Needs assistance/impaired Eating/Feeding: Independent;Bed level   Grooming: Wash/dry hands;Standing;Min guard   Upper Body Bathing: Set up;Sitting   Lower Body Bathing: Minimal assistance;Sit to/from stand   Upper Body Dressing : Set up;Sitting   Lower Body Dressing: Minimal assistance;Sitting/lateral leans   Toilet Transfer: Min guard;Ambulation;BSC;RW   Toileting- Clothing Manipulation and Hygiene: Minimal assistance;Sit to/from stand       Functional mobility during ADLs: Min guard;Rolling walker General ADL Comments: Pt reports she does not always change her adult diaper as she should and relates this to her UTI.     Vision Baseline Vision/History: 3 Glaucoma;1 Wears glasses (Glaucoma in L eye) Patient Visual Report: No change from baseline       Perception     Praxis      Pertinent Vitals/Pain Pain Assessment: No/denies pain     Hand Dominance Right   Extremity/Trunk Assessment Upper Extremity Assessment Upper Extremity Assessment: Overall WFL for tasks assessed   Lower Extremity Assessment Lower Extremity Assessment: Defer to PT evaluation   Cervical / Trunk Assessment Cervical / Trunk Assessment: Kyphotic   Communication Communication Communication: HOH   Cognition Arousal/Alertness: Awake/alert Behavior During Therapy: WFL for tasks assessed/performed Overall Cognitive Status: History of cognitive impairments - at  baseline                                     General Comments       Exercises     Shoulder Instructions      Home Living Family/patient expects to be discharged to:: Private residence Living Arrangements: Other relatives (granddaughter) Available Help at  Discharge: Family;Available PRN/intermittently Type of Home: House Home Access: Stairs to enter [employer] of Steps: 6 Entrance Stairs-Rails: Right Home Layout: One level     Bathroom Shower/Tub: Producer, television/film/video: Handicapped height     Home Equipment: Shower seat;Cane - single point;Walker - 2 wheels;Grab bars - toilet;Grab bars - tub/shower          Prior Functioning/Environment Level of Independence: Needs assistance  Gait / Transfers Assistance Needed: uses walker ADL's / Homemaking Assistance Needed: mod I for self care, sits to shower, assisted for IADL and transportation   Comments: Pt goes to a senior center 5 days a week where she receives 2 meals. She eats frozen mobile meals for supper.        OT Problem List: Decreased strength;Decreased activity tolerance;Impaired balance (sitting and/or standing);Decreased cognition;Decreased safety awareness;Decreased knowledge of use of DME or AE      OT Treatment/Interventions: Self-care/ADL training;DME and/or AE instruction;Therapeutic activities;Patient/family education;Balance training    OT Goals(Current goals can be found in the care plan section) Acute Rehab OT Goals Patient Stated Goal: return home and back to senior center OT Goal Formulation: With patient Time For Goal Achievement: 11/16/20 Potential to Achieve Goals: Good ADL Goals Pt Will Perform Grooming: with supervision;standing Pt Will Perform Lower Body Bathing: with supervision;sit to/from stand Pt Will Perform Lower Body Dressing: with supervision;sit to/from stand Pt Will Transfer to Toilet: with supervision;ambulating;bedside commode (over toilet) Pt Will Perform Toileting - Clothing Manipulation and hygiene: with supervision;sit to/from stand  OT Frequency: Min 2X/week   Barriers to D/C:            Co-evaluation              AM-PAC OT "6 Clicks" Daily Activity     Outcome Measure Help from another person  eating meals?: None Help from another person taking care of personal grooming?: A Little Help from another person toileting, which includes using toliet, bedpan, or urinal?: A Little Help from another person bathing (including washing, rinsing, drying)?: A Little Help from another person to put on and taking off regular upper body clothing?: None Help from another person to put on and taking off regular lower body clothing?: A Little 6 Click Score: 20   End of Session Equipment Utilized During Treatment: Rolling walker;Gait belt  Activity Tolerance: Patient tolerated treatment well Patient left: in bed;with call bell/phone within reach;with bed alarm set  OT Visit Diagnosis: Unsteadiness on feet (R26.81);Other abnormalities of gait and mobility (R26.89);Other symptoms and signs involving cognitive function;Muscle weakness (generalized) (M62.81)                Time: 9528-4132 OT Time Calculation (min): 15 min Charges:  OT General Charges $OT Visit: 1 Visit OT Evaluation $OT Eval Moderate Complexity: 1 Mod  Martie Round, OTR/L Acute Rehabilitation Services Pager: [phone] Office: [phone]   Evern Bio 11/02/2020, 1:50 PM

## 2020-11-02 NOTE — TOC Initial Note (Signed)
Transition of Care [facility]) - Initial/Assessment Note    Patient Details  Name: Yvonne Bailey MRN: [id number] Date of Birth: September 19, 1940  Transition of Care [facility]) CM/SW Contact:    Tom-Johnson, Hershal Coria, RN Phone Number: 11/02/2020, 4:15 PM  Clinical Narrative:                 CM consulted for TOC needs. Spoke with patient at bedside. Grand daughter in her 4's lives with patient and son visits frequently. Son drives patient to and from appointments and grand daughter assists as well. Has a cane, walker and bedside commode at home. List of Home Health agencies form Medicare.gov given to patient and patient chose [facility]. Spoke with Kandee Keen at [facility] [phone]) and he accept patient for PT/OT/Aide services. Information placed on AVS.  Son called and asked if patient could apply for Medicaid while at the hospital. CM explained to son that patient already has [facility] and that patient or family will have to go to Social services office to apply. Son also notified of home health services arranged. TOC will continue to follow with needs.  Expected Discharge Plan: Home w Home Health Services Barriers to Discharge: Continued Medical Work up   Patient Goals and CMS Choice Patient states their goals for this hospitalization and ongoing recovery are:: To go home CMS Medicare.gov Compare Post Acute Care list provided to:: Patient Choice offered to / list presented to : Patient  Expected Discharge Plan and Services Expected Discharge Plan: Home w Home Health Services In-house Referral: Clinical Social Work Discharge Planning Services: CM Consult Post Acute Care Choice: Home Health Living arrangements for the past 2 months: Single Family Home                           HH Arranged: PT HH Agency: [facility] Date [facility] Agency Contacted: 11/02/20 Time HH Agency Contacted: 1549 Representative spoke with at [facility] Agency: Kandee Keen  Prior Living Arrangements/Services Living  arrangements for the past 2 months: Single Family Home Lives with:: Adult Children (Grand daughter lives with her and her son visits frequently.) Patient language and need for interpreter reviewed:: Yes Do you feel safe going back to the place where you live?: Yes      Need for Family Participation in Patient Care: Yes (Comment) Care giver support system in place?: Yes (comment)   Criminal Activity/Legal Involvement Pertinent to Current Situation/Hospitalization: No - Comment as needed  Activities of Daily Living Home Assistive Devices/Equipment: None ADL Screening (condition at time of admission) Patient's cognitive ability adequate to safely complete daily activities?: Yes Is the patient deaf or have difficulty hearing?: No Does the patient have difficulty seeing, even when wearing glasses/contacts?: No Does the patient have difficulty concentrating, remembering, or making decisions?: No Patient able to express need for assistance with ADLs?: Yes Does the patient have difficulty dressing or bathing?: No Independently performs ADLs?: Yes (appropriate for developmental age) Does the patient have difficulty walking or climbing stairs?: No Weakness of Legs: None Weakness of Arms/Hands: None  Permission Sought/Granted Permission sought to share information with : Case Manager, Magazine features editor Permission granted to share information with : Yes, Verbal Permission Granted              Emotional Assessment Appearance:: Appears stated age Attitude/Demeanor/Rapport: Engaged Affect (typically observed): Accepting, Appropriate Orientation: : Oriented to Self, Oriented to Place, Oriented to Situation Alcohol / Substance Use: Not Applicable Psych Involvement: No (comment)  Admission diagnosis:  Bacteremia due to Enterococcus [R78.81, B95.2] Patient Active Problem List   Diagnosis Date Noted   AKI (acute kidney injury) (HCC) 10/31/2020   Bacteremia due to Enterococcus  10/31/2020   Confusion 10/17/2020   Acute cystitis 10/17/2020   Decreased GFR 10/17/2020   Rash 08/29/2020   TIA (transient ischemic attack) 06/10/2020   Anxiety and depression 03/28/2020   Overactive bladder 03/28/2020   Lack of motivation 12/21/2019   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   Aphasia as late effect of cerebrovascular accident (CVA)    Acute ischemic cerebrovascular accident (CVA) involving left middle cerebral artery [facility])    Coronary artery disease involving native coronary artery without angina pectoris    Stage 3 chronic kidney disease (HCC)    Prediabetes 07/28/2016   DOE (dyspnea on exertion) 07/09/2016   Gait instability 07/09/2016   Cardiomyopathy, ischemic    Chronic systolic heart failure (HCC) 08/21/2015   Bilateral leg edema 07/19/2015   Atrial fibrillation (HCC) 07/19/2015   Urinary urgency 03/30/2015   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   Small vessel disease, cerebrovascular 12/17/2012   CAROTID BRUIT, RIGHT 08/10/2008   Peripheral vascular disease (HCC) 06/04/2007   Diverticulosis of large intestine 06/04/2007   Hypothyroidism 02/24/2007   Dyslipidemia 02/24/2007   Essential hypertension 02/24/2007   Acute thromboembolism of deep veins of lower extremity (HCC) 01/21/2007   Coronary atherosclerosis 10/29/2006   PCP:  Pincus Sanes, MD Pharmacy:   [facility] [phone] [place], [place] - [address] AT [facility] OF [address] [place] [address] Phone: [phone] Fax: [phone]  [facility] (Now [facility]) - [place], [place] - [address] [place] [address] Phone: [phone] Fax: [phone]     Social Determinants of Health (SDOH) Interventions    Readmission Risk Interventions No flowsheet data found.

## 2020-11-02 NOTE — Plan of Care (Signed)
  Problem: Activity: Goal: Risk for activity intolerance will decrease Outcome: Progressing   Problem: Coping: Goal: Level of anxiety will decrease Outcome: Progressing   Problem: Elimination: Goal: Will not experience complications related to bowel motility Outcome: Progressing   

## 2020-11-02 NOTE — Interval H&P Note (Signed)
History and Physical Interval Note:  11/02/2020 8:18 AM  Yvonne Bailey  has presented today for surgery, with the diagnosis of BACTEREMIA.  The various methods of treatment have been discussed with the patient and family. After consideration of risks, benefits and other options for treatment, the patient has consented to  Procedure(s): TRANSESOPHAGEAL ECHOCARDIOGRAM (TEE) (N/A) as a surgical intervention.  The patient's history has been reviewed, patient examined, no change in status, stable for surgery.  I have reviewed the patient's chart and labs.  Questions were answered to the patient's satisfaction.     Draedyn Weidinger

## 2020-11-02 NOTE — Op Note (Addendum)
INDICATIONS: infective endocarditis  PROCEDURE:   Informed consent was obtained prior to the procedure. The risks, benefits and alternatives for the procedure were discussed and the patient comprehended these risks.  Risks include, but are not limited to, cough, sore throat, vomiting, nausea, somnolence, esophageal and stomach trauma or perforation, bleeding, low blood pressure, aspiration, pneumonia, infection, trauma to the teeth and death.    During this procedure the patient was administered IV propofol by Anesthesiology.  The transesophageal probe was inserted in the esophagus and stomach without difficulty and multiple views were obtained.  The patient was kept under observation until the patient left the procedure room.  The patient left the procedure room in stable condition.   Agitated microbubble saline contrast was not administered.  COMPLICATIONS:    There were no immediate complications.  FINDINGS:  No evidence of endocarditis. The left ventricle is moderately depressed due to a large anteroapical scar, c/w old infarction. EF 35-40%. No LV thrombus. No major valvular abnormalities. Biatrial dilation. There is dense "smoke"/near-thrombus in the left atrial appendage. Aortic atherosclerosis.  RECOMMENDATIONS:    No evidence for endocarditis by echo. Known history of extensive CAD - LV function comparable to 2017 assessment.  Time Spent Directly with the Patient:  30 minutes   Yvonne Bailey 11/02/2020, 8:02 AM

## 2020-11-02 NOTE — Progress Notes (Signed)
PROGRESS NOTE  ARLENY KRUGER  DOB: October 25, 1940  PCP: Pincus Sanes, MD BJS:[id number]  DOA: 10/30/2020  LOS: 2 days  Hospital Day: 4   Chief complaint: Abdominal discomfort  Brief narrative: ODALIS JORDAN is a 80 y.o. female with PMH significant for dementia, HTN, CAD s/p CABG, paroxysmal Afib on Eliquis, ischemic cardiomyopathy with EF 20 to 25%, improved to 40 to 45%, AAA, CKD 3  Patient presented to the ED on 8/22 with complaint of lower abdominal discomfort.  Blood cultures were drawn.  Patient was discharged home on oral Keflex for presumed UTI.  Same evening, she was called back to come to the ED for positive blood cultures growing Enterococcus faecalis.   Admitted to hospital service ID consult was obtained  Subjective: Patient was seen and examined this morning.  Not in distress.  No new symptoms.  Underwent TEE this morning.  No evidence of endocarditis.  Pending final blood culture report.  Assessment/Plan: Enterococcus faecalis bacteremia -Presumed to be secondary to UTI -Urine cultures from 8/21 with> 100 K GNR, follow-up sensitivities -Repeat blood cultures sent on 8/23, pending report. -Continue IV ampicillin, per ID recommendation -Abdominal pain improving.  CT abdomen pelvis is unremarkable for acute findings -Underwent TEE this morning 8/25.  No evidence of endocarditis.   -Discussed with ID Dr. Ilsa Iha.  Pending final blood culture and sensitivity.   Constipation, fecal impaction -Continue laxatives   History of chronic systolic CHF/ischemic cardiomyopathy Essential hypertension -Home meds include Toprol 50 mg daily, amlodipine 2.5 mg daily, benazepril 30 mg daily, Lasix 20 mg daily, Aldactone 25 mg daily -EF per TEE on 10/2533 to 40%. -Currently patient is on metoprolol and blood pressure this morning is running in 150s.  Benazepril, Lasix and Aldactone were held on admission because of AKI.  I would resume benazepril today.   AKI on CKD  3b -creatinine improved back to normal.. Recent Labs    03/28/20 1552 06/07/20 2235 09/08/20 1834 10/17/20 0915 10/29/20 2351 10/30/20 1928 10/31/20 0651 11/01/20 0338 11/02/20 0425  BUN 23 27* 28* 49* 30* 36* 34* 25* 19  CREATININE 1.09 1.31* 1.37* 2.15* 1.46* 1.72* 1.36* 1.10* 0.94    Paroxysmal atrial fibrillation -In sinus rhythm, continue metoprolol and Eliquis   History of CAD/PCI multiple stents -Stable, continue aspirin, statin, metoprolol   Memory/cognitive deficits -Stable at baseline.     Mobility: PT/OT eval Code Status:   Code Status: Full Code  Nutritional status: Body mass index is 25.55 kg/m.     Diet:  Diet Order             Diet Heart Room service appropriate? Yes; Fluid consistency: Thin  Diet effective now                  DVT prophylaxis:   apixaban (ELIQUIS) tablet 5 mg   Antimicrobials: IV ampicillin Fluid: None Consultants: ID Family Communication: None at bedside  Status is: Inpatient  Remains inpatient appropriate because: On IV antibiotics, pending final sensitivity data.  Dispo: The patient is from: Home              Anticipated d/c is to: Hopefully home, pending PT eval.                Patient currently is not medically stable to d/c.   Difficult to place patient No     Infusions:   ampicillin-sulbactam (UNASYN) IV 3 g (11/02/20 0517)    Scheduled Meds:  apixaban  5 mg Oral  BID   aspirin EC  81 mg Oral Daily   atorvastatin  80 mg Oral Daily   brimonidine  1 drop Both Eyes BID   dorzolamide-timolol  1 drop Both Eyes BID   latanoprost  1 drop Both Eyes QHS   levothyroxine  88 mcg Oral QAC breakfast   metoprolol succinate  50 mg Oral Daily   mirabegron ER  25 mg Oral Daily   polyethylene glycol  17 g Oral Daily   sertraline  50 mg Oral Daily    Antimicrobials: Anti-infectives (From admission, onward)    Start     Dose/Rate Route Frequency Ordered Stop   11/01/20 1200  Ampicillin-Sulbactam (UNASYN) 3 g in  sodium chloride 0.9 % 100 mL IVPB        3 g 200 mL/hr over 30 Minutes Intravenous Every 6 hours 11/01/20 1006     10/31/20 2200  ampicillin (OMNIPEN) 2 g in sodium chloride 0.9 % 100 mL IVPB  Status:  Discontinued        2 g 300 mL/hr over 20 Minutes Intravenous Every 6 hours 10/31/20 1538 11/01/20 1006   10/31/20 0600  ampicillin (OMNIPEN) 1 g in sodium chloride 0.9 % 100 mL IVPB  Status:  Discontinued        1 g 300 mL/hr over 20 Minutes Intravenous Every 6 hours 10/31/20 0034 10/31/20 0047   10/31/20 0600  ampicillin (OMNIPEN) 2 g in sodium chloride 0.9 % 100 mL IVPB  Status:  Discontinued        2 g 300 mL/hr over 20 Minutes Intravenous Every 8 hours 10/31/20 0047 10/31/20 1538   10/30/20 2245  vancomycin (VANCOREADY) IVPB 1500 mg/300 mL  Status:  Discontinued        1,500 mg 150 mL/hr over 120 Minutes Intravenous  Once 10/30/20 2231 10/30/20 2232   10/30/20 2245  ampicillin (OMNIPEN) 2 g in sodium chloride 0.9 % 100 mL IVPB        2 g 300 mL/hr over 20 Minutes Intravenous  Once 10/30/20 2233 10/30/20 2339       PRN meds: acetaminophen **OR** acetaminophen, ondansetron **OR** ondansetron (ZOFRAN) IV   Objective: Vitals:   11/02/20 0826 11/02/20 0849  BP: (!) 149/83 (!) 151/73  Pulse: 69 (!) 57  Resp: 18 19  Temp:  (!) 97.5 F (36.4 C)  SpO2: 100% 100%    Intake/Output Summary (Last 24 hours) at 11/02/2020 1025 Last data filed at 11/02/2020 0805 Gross per 24 hour  Intake 680 ml  Output 500 ml  Net 180 ml    Filed Weights   11/01/20 2031 11/02/20 0715  Weight: 74 kg 74 kg   Weight change:  Body mass index is 25.55 kg/m.   Physical Exam: General exam: Pleasant, elderly African-American female.  Not in distress Skin: No rashes, lesions or ulcers. HEENT: Atraumatic, normocephalic, no obvious bleeding Lungs: Clear to auscultation bilaterally CVS: Regular rate and rhythm, no murmur. GI/Abd soft, nontender, nondistended, bowel sound present CNS: Alert, awake and  slow to respond, knows she is in the hospital Psychiatry: Mood appropriate Extremities: No pedal edema, no calf tenderness  Data Review: I have personally reviewed the laboratory data and studies available.  Recent Labs  Lab 10/29/20 2351 10/30/20 1928 10/31/20 0651 11/01/20 0338 11/02/20 0425  WBC 13.7* 13.7* 10.8* 11.3* 9.3  NEUTROABS 11.1* 10.6*  --   --  5.7  HGB 13.1 12.5 11.1* 10.5* 10.3*  HCT 40.8 39.7 34.2* 31.8* 32.2*  MCV 102.0* 102.6*  100.6* 100.6* 102.5*  PLT 238 249 200 196 190    Recent Labs  Lab 10/29/20 2351 10/30/20 1928 10/31/20 0651 11/01/20 0338 11/02/20 0425  NA 140 139 140 139 138  K 4.3 4.6 4.1 4.2 4.1  CL 104 101 108 109 108  CO2 25 26 25 25 22   GLUCOSE 137* 129* 92 97 80  BUN 30* 36* 34* 25* 19  CREATININE 1.46* 1.72* 1.36* 1.10* 0.94  CALCIUM 9.8 9.5 8.6* 8.5* 8.5*     F/u labs ordered Unresulted Labs (From admission, onward)     Start     Ordered   11/02/20 0500  CBC with Differential/Platelet  Daily,   R      11/01/20 1028   11/02/20 0500  Basic metabolic panel  Daily,   R      11/01/20 1028   10/31/20 0033  Urine Culture  Once,   STAT       Comments: Bacteremia of enterococcus   Question:  Indication  Answer:  Dysuria   10/31/20 0034            Signed, 11/02/20, MD [facility] 11/02/2020

## 2020-11-02 NOTE — Anesthesia Postprocedure Evaluation (Signed)
Anesthesia Post Note  Patient: Yvonne Bailey  Procedure(s) Performed: TRANSESOPHAGEAL ECHOCARDIOGRAM (TEE)     Patient location during evaluation: Endoscopy Anesthesia Type: MAC Level of consciousness: awake and alert Pain management: pain level controlled Vital Signs Assessment: post-procedure vital signs reviewed and stable Respiratory status: spontaneous breathing, nonlabored ventilation, respiratory function stable and patient connected to nasal cannula oxygen Cardiovascular status: stable and blood pressure returned to baseline Postop Assessment: no apparent nausea or vomiting Anesthetic complications: no   No notable events documented.  Last Vitals:  Vitals:   11/02/20 0826 11/02/20 0849  BP: (!) 149/83 (!) 151/73  Pulse: 69 (!) 57  Resp: 18 19  Temp:  (!) 36.4 C  SpO2: 100% 100%    Last Pain:  Vitals:   11/02/20 0849  TempSrc: Oral  PainSc:                  March Rummage Shakur Lembo

## 2020-11-02 NOTE — Progress Notes (Signed)
Echocardiogram Echocardiogram Transesophageal has been performed.  Warren Lacy Tearsa Kowalewski RDCS 11/02/2020, 8:12 AM

## 2020-11-02 NOTE — Transfer of Care (Signed)
Immediate Anesthesia Transfer of Care Note  Patient: Yvonne Bailey  Procedure(s) Performed: TRANSESOPHAGEAL ECHOCARDIOGRAM (TEE)  Patient Location: PACU and Endoscopy Unit  Anesthesia Type:MAC  Level of Consciousness: drowsy, patient cooperative and responds to stimulation  Airway & Oxygen Therapy: Patient Spontanous Breathing  Post-op Assessment: Report given to RN and Post -op Vital signs reviewed and stable  Post vital signs: Reviewed and stable  Last Vitals:  Vitals Value Taken Time  BP    Temp    Pulse 73 11/02/20 0807  Resp 18 11/02/20 0807  SpO2 98 % 11/02/20 0807  Vitals shown include unvalidated device data.  Last Pain:  Vitals:   11/02/20 0715  TempSrc: Oral  PainSc: 0-No pain         Complications: No notable events documented.

## 2020-11-02 NOTE — Progress Notes (Signed)
[facility]    Date of Admission:  10/30/2020   Total days of antibiotics 5/day 2 amp/sub           ID: Yvonne Bailey is a 80 y.o. female with  enterococcal/acinetobacter bacteremia Principal Problem:   Bacteremia due to Enterococcus Active Problems:   Essential hypertension   Atrial fibrillation (HCC)   Stage 3 chronic kidney disease (HCC)   Acute cystitis   AKI (acute kidney injury) (HCC)    Subjective: Remains afebrile.underwent TEE without difficulty which was negative for any vegetations. She is more alert, hungry this morning.   Medications:   apixaban  5 mg Oral BID   aspirin EC  81 mg Oral Daily   atorvastatin  80 mg Oral Daily   benazepril  30 mg Oral Daily   brimonidine  1 drop Both Eyes BID   dorzolamide-timolol  1 drop Both Eyes BID   latanoprost  1 drop Both Eyes QHS   levothyroxine  88 mcg Oral QAC breakfast   metoprolol succinate  50 mg Oral Daily   mirabegron ER  25 mg Oral Daily   polyethylene glycol  17 g Oral Daily   sertraline  50 mg Oral Daily    Objective: Vital signs in last 24 hours: Temp:  [97.5 F (36.4 C)-98.7 F (37.1 C)] 97.5 F (36.4 C) (08/25 0849) Pulse Rate:  [57-80] 57 (08/25 0849) Resp:  [16-20] 19 (08/25 0849) BP: (85-151)/(51-83) 151/73 (08/25 0849) SpO2:  [98 %-100 %] 100 % (08/25 0849) Weight:  [74 kg] 74 kg (08/25 0715)  Physical Exam  Constitutional:  oriented to person, place, and time. appears well-developed and well-nourished. No distress.  HENT: [place]/AT, PERRLA, no scleral icterus Mouth/Throat: Oropharynx is clear and moist. No oropharyngeal exudate.  Cardiovascular: Normal rate, regular rhythm and normal heart sounds. Exam reveals no gallop and no friction rub.  No murmur heard.  Pulmonary/Chest: Effort normal and breath sounds normal. No respiratory distress.  has no wheezes.  Neck = supple, no nuchal rigidity Abdominal: Soft. Bowel sounds are normal.  exhibits no distension. There  is no tenderness.  Lymphadenopathy: no cervical adenopathy. No axillary adenopathy Neurological: alert and oriented to person, place, and time.  Skin: Skin is warm and dry. No rash noted. No erythema.  Psychiatric: a normal mood and affect.  behavior is normal.    Lab Results Recent Labs    11/01/20 0338 11/02/20 0425  WBC 11.3* 9.3  HGB 10.5* 10.3*  HCT 31.8* 32.2*  NA 139 138  K 4.2 4.1  CL 109 108  CO2 25 22  BUN 25* 19  CREATININE 1.10* 0.94     Microbiology: Reviewed blood cx on 8/23 NGTD at 48hr Studies/Results: ECHO TEE  Result Date: 11/02/2020    TRANSESOPHOGEAL ECHO REPORT   Patient Name:   Yvonne Bailey Date of Exam: 11/02/2020 Medical Rec #:  [id number]           Height:       67.0 in Accession #:    [id number]          Weight:       163.1 lb Date of Birth:  Mar 13, 1940            BSA:          1.855 m Patient Age:    80 years            BP:  155/78 mmHg Patient Gender: F                   HR:           73 bpm. Exam Location:  Inpatient Procedure: Transesophageal Echo, Color Doppler and Cardiac Doppler Indications:     Bacteremia  History:         Patient has prior history of Echocardiogram examinations, most                  recent 03/03/2017. CAD, Arrythmias:Atrial Fibrillation; Risk                  Factors:Hypertension and Dyslipidemia.  Sonographer:     Irving Burton Senior RDCS Referring Phys:  [id number] Corrin Parker Diagnosing Phys: Thurmon Fair MD PROCEDURE: After discussion of the risks and benefits of a TEE, an informed consent was obtained from the patient. The transesophogeal probe was passed without difficulty through the esophogus of the patient. Local oropharyngeal anesthetic was provided with Cetacaine. Sedation performed by different physician. The patient was monitored while under deep sedation. Anesthestetic sedation was provided intravenously by Anesthesiology: 115mg  of Propofol, 40mg  of Lidocaine. The patient developed no complications during the  procedure. IMPRESSIONS  1. Some calcification is seen in the apical left ventricular myocardium, consistent with scar. Left ventricular ejection fraction, by estimation, is 35 to 40%. The left ventricle has moderately decreased function. The left ventricle demonstrates regional wall motion abnormalities (see scoring diagram/findings for description). Left ventricular diastolic function could not be evaluated. There is severe hypokinesis of the left ventricular, mid-apical anteroseptal wall, anterior wall and apical segment. There is severe hypokinesis of the left ventricular, mid-apical inferior wall.  2. Right ventricular systolic function is mildly reduced. The right ventricular size is normal. There is normal pulmonary artery systolic pressure.  3. Left atrial size was moderately dilated. No left atrial/left atrial appendage thrombus was detected.  4. Right atrial size was moderately dilated.  5. The mitral valve is normal in structure. Trivial mitral valve regurgitation.  6. The aortic valve is tricuspid. Aortic valve regurgitation is mild. Mild aortic valve sclerosis is present, with no evidence of aortic valve stenosis.  7. There is Moderate (Grade III) layered plaque involving the descending aorta and transverse aorta. FINDINGS  Left Ventricle: Some calcification is seen in the apical left ventricular myocardium, consistent with scar. Left ventricular ejection fraction, by estimation, is 35 to 40%. The left ventricle has moderately decreased function. The left ventricle demonstrates regional wall motion abnormalities. Severe hypokinesis of the left ventricular, mid-apical anteroseptal wall, anterior wall and apical segment. Severe hypokinesis of the left ventricular, mid-apical inferior wall. The left ventricular internal cavity size was normal in size. There is no left ventricular hypertrophy. Left ventricular diastolic function could not be evaluated due to atrial fibrillation. Left ventricular diastolic  function could not be evaluated.  LV Wall Scoring: The apex is akinetic. The mid and distal anterior wall, mid and distal anterior septum, and mid and distal inferior wall are hypokinetic. Right Ventricle: The right ventricular size is normal. No increase in right ventricular wall thickness. Right ventricular systolic function is mildly reduced. There is normal pulmonary artery systolic pressure. The tricuspid regurgitant velocity is 1.90 m/s, and with an assumed right atrial pressure of 5 mmHg, the estimated right ventricular systolic pressure is 19.4 mmHg. Left Atrium: Left atrial size was moderately dilated. Spontaneous echo contrast was present in the left atrial appendage and left atrium. No left atrial/left atrial  appendage thrombus was detected. Right Atrium: Right atrial size was moderately dilated. Pericardium: There is no evidence of pericardial effusion. Mitral Valve: The mitral valve is normal in structure. Trivial mitral valve regurgitation. Tricuspid Valve: The tricuspid valve is normal in structure. Tricuspid valve regurgitation is mild. Aortic Valve: The aortic valve is tricuspid. Aortic valve regurgitation is mild. Mild aortic valve sclerosis is present, with no evidence of aortic valve stenosis. Pulmonic Valve: The pulmonic valve was normal in structure. Pulmonic valve regurgitation is trivial. Aorta: The aortic root, ascending aorta, aortic arch and descending aorta are all structurally normal, with no evidence of dilitation or obstruction. There is moderate (Grade III) layered plaque involving the descending aorta and transverse aorta. IAS/Shunts: No atrial level shunt detected by color flow Doppler.  TRICUSPID VALVE TR Peak grad:   14.4 mmHg TR Vmax:        190.00 cm/s Rachelle Hora Croitoru MD Electronically signed by Thurmon Fair MD Signature Date/Time: 11/02/2020/10:06:46 AM    Final      Assessment/Plan: Enterococcal bacteremia = likely thought to be GI source, continue on amp/sub for the time  being. Following repeat blood cx  GNR bacteremia = prelim is acinetobacter. Awaiting confirmation and sensitivities to decide on her final abtx regimen. Likely tomorrow.  Leukocytosis = improving  Overall clinically improving.  [facility] Cell: [phone] Pager: [phone]  11/02/2020, 10:49 AM

## 2020-11-03 ENCOUNTER — Encounter (HOSPITAL_COMMUNITY): Payer: Self-pay | Admitting: Cardiovascular Disease

## 2020-11-03 ENCOUNTER — Telehealth: Payer: Self-pay | Admitting: Emergency Medicine

## 2020-11-03 DIAGNOSIS — B952 Enterococcus as the cause of diseases classified elsewhere: Secondary | ICD-10-CM | POA: Diagnosis not present

## 2020-11-03 DIAGNOSIS — R7881 Bacteremia: Secondary | ICD-10-CM | POA: Diagnosis not present

## 2020-11-03 LAB — BASIC METABOLIC PANEL
Anion gap: 5 (ref 5–15)
BUN: 16 mg/dL (ref 8–23)
CO2: 23 mmol/L (ref 22–32)
Calcium: 8.1 mg/dL — ABNORMAL LOW (ref 8.9–10.3)
Chloride: 111 mmol/L (ref 98–111)
Creatinine, Ser: 0.99 mg/dL (ref 0.44–1.00)
GFR, Estimated: 58 mL/min — ABNORMAL LOW (ref >60)
Glucose, Bld: 91 mg/dL (ref 70–99)
Potassium: 4.2 mmol/L (ref 3.5–5.1)
Sodium: 139 mmol/L (ref 135–145)

## 2020-11-03 LAB — CBC WITH DIFFERENTIAL/PLATELET
Abs Immature Granulocytes: 0.03 10*3/uL (ref 0.00–0.07)
Basophils Absolute: 0 10*3/uL (ref 0.0–0.1)
Basophils Relative: 0 %
Eosinophils Absolute: 0.3 10*3/uL (ref 0.0–0.5)
Eosinophils Relative: 3 %
HCT: 31.7 % — ABNORMAL LOW (ref 36.0–46.0)
Hemoglobin: 10.5 g/dL — ABNORMAL LOW (ref 12.0–15.0)
Immature Granulocytes: 0 %
Lymphocytes Relative: 26 %
Lymphs Abs: 2.5 10*3/uL (ref 0.7–4.0)
MCH: 33 pg (ref 26.0–34.0)
MCHC: 33.1 g/dL (ref 30.0–36.0)
MCV: 99.7 fL (ref 80.0–100.0)
Monocytes Absolute: 0.8 10*3/uL (ref 0.1–1.0)
Monocytes Relative: 9 %
Neutro Abs: 5.9 10*3/uL (ref 1.7–7.7)
Neutrophils Relative %: 62 %
Platelets: 192 10*3/uL (ref 150–400)
RBC: 3.18 MIL/uL — ABNORMAL LOW (ref 3.87–5.11)
RDW: 13.2 % (ref 11.5–15.5)
WBC: 9.6 10*3/uL (ref 4.0–10.5)
nRBC: 0 % (ref 0.0–0.2)

## 2020-11-03 LAB — CULTURE, BLOOD (ROUTINE X 2): Special Requests: ADEQUATE

## 2020-11-03 MED ORDER — AMOXICILLIN 500 MG PO CAPS
500.0000 mg | ORAL_CAPSULE | Freq: Three times a day (TID) | ORAL | Status: DC
  Administered 2020-11-03: 500 mg via ORAL
  Filled 2020-11-03 (×3): qty 1

## 2020-11-03 MED ORDER — SULFAMETHOXAZOLE-TRIMETHOPRIM 800-160 MG PO TABS
1.0000 | ORAL_TABLET | Freq: Two times a day (BID) | ORAL | Status: DC
  Administered 2020-11-03: 1 via ORAL
  Filled 2020-11-03: qty 1

## 2020-11-03 MED ORDER — SPIRONOLACTONE 25 MG PO TABS
25.0000 mg | ORAL_TABLET | Freq: Every day | ORAL | Status: DC
  Administered 2020-11-03: 25 mg via ORAL
  Filled 2020-11-03: qty 1

## 2020-11-03 MED ORDER — SULFAMETHOXAZOLE-TRIMETHOPRIM 800-160 MG PO TABS: 1.0000 | ORAL_TABLET | Freq: Two times a day (BID) | ORAL | 0 refills | Status: AC

## 2020-11-03 MED ORDER — AMOXICILLIN 500 MG PO CAPS: 500.0000 mg | ORAL_CAPSULE | Freq: Three times a day (TID) | ORAL | 0 refills | Status: AC

## 2020-11-03 MED ORDER — FUROSEMIDE 20 MG PO TABS
20.0000 mg | ORAL_TABLET | Freq: Every day | ORAL | 0 refills | Status: DC | PRN
Start: 2020-11-03 — End: 2021-09-05

## 2020-11-03 NOTE — Discharge Summary (Signed)
Physician Discharge Summary  MADIGAN ROSENSTEEL UEA:[id number] DOB: 07-07-1940 DOA: 10/30/2020  PCP: Pincus Sanes, MD  Admit date: 10/30/2020 Discharge date: 11/03/2020  Admitted From: Home Discharge disposition: Home with home health PT   Code Status: Full Code   Discharge Diagnosis:   Principal Problem:   Bacteremia due to Enterococcus Active Problems:   Essential hypertension   Atrial fibrillation (HCC)   Stage 3 chronic kidney disease (HCC)   Acute cystitis   AKI (acute kidney injury) (HCC)   Chief complaint: Abdominal discomfort  Brief narrative: Yvonne Bailey is a 80 y.o. female with PMH significant for dementia, HTN, CAD s/p CABG, paroxysmal Afib on Eliquis, ischemic cardiomyopathy with EF 20 to 25%, improved to 40 to 45%, AAA, CKD 3  Patient presented to the ED on 8/22 with complaint of lower abdominal discomfort.  Blood cultures were drawn.  Patient was discharged home on oral Keflex for presumed UTI.  Same evening, she was called back to come to the ED for positive blood cultures growing Enterococcus faecalis.   Admitted to hospital service ID consult was obtained  Subjective: Patient was seen and examined this morning.  Propped up in bed.  Not in distress.  No new symptoms.  Pending final sensitivity of the blood culture report.  Hospital course: Polymicrobial bacteremia -Blood culture grew Enterococcus faecalis and Acinetobacter Lwoffii, only in 1 of 4 bottles, suspect transient bacteremia from GI source.  -ID consult was obtained. -TEE negative for endocarditis. -Per ID recommendation, at discharge  For enterococcus = will switch to amoxicillin and needs 5 more days For acinetobacter = will switch to bactrim ds 1 bid x 5 days  UTI with E. coli and Proteus mirabilis -Present with abdominal pain.  Urine culture grew more than 1000 CFU per mL of E. coli and Proteus mirabilis. -Treated with a course of IV antibiotic in the hospital.  Constipation,  fecal impaction -Continue laxatives   History of chronic systolic CHF/ischemic cardiomyopathy Essential hypertension -Home meds include Toprol 50 mg daily, amlodipine 2.5 mg daily, benazepril 30 mg daily, Lasix 20 mg daily, Aldactone 25 mg daily -EF per TEE on 10/2533 to 40%. -Currently patient is on metoprolol and benazepril.  Resume Aldactone today.  Plan to resume Lasix 20 mg daily as needed at home.  Amlodipine remains on hold   AKI on CKD 3b -creatinine improved back to normal.. Recent Labs    03/28/20 1552 06/07/20 2235 09/08/20 1834 10/17/20 0915 10/29/20 2351 10/30/20 1928 10/31/20 0651 11/01/20 0338 11/02/20 0425 11/03/20 0301  BUN 23 27* 28* 49* 30* 36* 34* 25* 19 16  CREATININE 1.09 1.31* 1.37* 2.15* 1.46* 1.72* 1.36* 1.10* 0.94 0.99   Paroxysmal atrial fibrillation -In sinus rhythm, continue metoprolol and Eliquis   History of CAD/PCI multiple stents -Stable, continue aspirin, statin, metoprolol   Memory/cognitive deficits -Stable at baseline.      Allergies as of 11/03/2020       Reactions   Definity [perflutren Lipid Microsphere] Other (See Comments)   Patient experienced back pain with injection   Cilostazol Other (See Comments)    Pletal :" head felt funny"        Medication List     STOP taking these medications    amLODipine 2.5 MG tablet Commonly known as: NORVASC       TAKE these medications    amoxicillin 500 MG capsule Commonly known as: AMOXIL Take 1 capsule (500 mg total) by mouth every 8 (eight) hours for 5  days.   aspirin EC 81 MG tablet Take 1 tablet (81 mg total) by mouth daily.   atorvastatin 80 MG tablet Commonly known as: LIPITOR TAKE 1 TABLET(80 MG) BY MOUTH DAILY What changed: See the new instructions.   benazepril 20 MG tablet Commonly known as: LOTENSIN TAKE 1 AND 1/2 TABLETS(30 MG) BY MOUTH DAILY What changed: See the new instructions.   brimonidine 0.2 % ophthalmic solution Commonly known as:  ALPHAGAN Place 1 drop into both eyes 2 (two) times daily.   dorzolamide-timolol 22.3-6.8 MG/ML ophthalmic solution Commonly known as: COSOPT Place 1 drop into both eyes 2 (two) times daily.   Eliquis 5 MG Tabs tablet Generic drug: apixaban Take 1 tablet (5 mg total) by mouth 2 (two) times daily.   furosemide 20 MG tablet Commonly known as: LASIX Take 1 tablet (20 mg total) by mouth daily as needed for edema. What changed: See the new instructions.   levothyroxine 88 MCG tablet Commonly known as: SYNTHROID TAKE 1 TABLET(88 MCG) BY MOUTH DAILY What changed: See the new instructions.   metoprolol succinate 50 MG 24 hr tablet Commonly known as: TOPROL-XL TAKE 1 TABLET(50 MG) BY MOUTH DAILY What changed: See the new instructions.   Myrbetriq 25 MG Tb24 tablet Generic drug: mirabegron ER TAKE 1 TABLET(25 MG) BY MOUTH DAILY What changed: See the new instructions.   nitroGLYCERIN 0.4 MG SL tablet Commonly known as: Nitrostat Place 1 tablet (0.4 mg total) under the tongue every 5 (five) minutes as needed for chest pain.   nystatin-triamcinolone ointment Commonly known as: MYCOLOG Apply 1 application topically 2 (two) times daily.   polyethylene glycol 17 g packet Commonly known as: MiraLax Take one dose 3 times a day until bowel clear, maximum of 3 consecutive days. What changed:  how much to take how to take this when to take this reasons to take this additional instructions   sertraline 50 MG tablet Commonly known as: ZOLOFT Take 1 tablet (50 mg total) by mouth daily.   spironolactone 25 MG tablet Commonly known as: ALDACTONE TAKE 1 TABLET(25 MG) BY MOUTH DAILY What changed: See the new instructions.   sulfamethoxazole-trimethoprim 800-160 MG tablet Commonly known as: BACTRIM DS Take 1 tablet by mouth every 12 (twelve) hours for 5 days.   travoprost (benzalkonium) 0.004 % ophthalmic solution Commonly known as: TRAVATAN Place 1 drop into both eyes at bedtime.         Discharge Instructions:  Diet Recommendation:  Discharge Diet Orders (From admission, onward)     Start     Ordered   11/03/20 0000  Diet - low sodium heart healthy        11/03/20 1110              Follow with Primary MD Pincus SanesBurns, Stacy J, MD in 7 days   Get CBC/BMP checked in next visit within 1 week by PCP or SNF MD ( we routinely change or add medications that can affect your baseline labs and fluid status, therefore we recommend that you get the mentioned basic workup next visit with your PCP, your PCP may decide not to get them or add new tests based on their clinical decision)  On your next visit with your PCP, please Get Medicines reviewed and adjusted.  Please request your PCP  to go over all Hospital Tests and Procedure/Radiological results at the follow up, please get all Hospital records sent to your Prim MD by signing hospital release before you go home.  Activity:  As tolerated with Full fall precautions use walker/cane & assistance as needed  For Heart failure patients - Check your Weight same time everyday, if you gain over 2 pounds, or you develop in leg swelling, experience more shortness of breath or chest pain, call your Primary MD immediately. Follow Cardiac Low Salt Diet and 1.5 lit/day fluid restriction.  If you have smoked or chewed Tobacco in the last 2 yrs please stop smoking, stop any regular Alcohol  and or any Recreational drug use.  If you experience worsening of your admission symptoms, develop shortness of breath, life threatening emergency, suicidal or homicidal thoughts you must seek medical attention immediately by calling 911 or calling your MD immediately  if symptoms less severe.  You Must read complete instructions/literature along with all the possible adverse reactions/side effects for all the Medicines you take and that have been prescribed to you. Take any new Medicines after you have completely understood and accpet all the possible  adverse reactions/side effects.   Do not drive, operate heavy machinery, perform activities at heights, swimming or participation in water activities or provide baby sitting services if your were admitted for syncope or siezures until you have seen by Primary MD or a Neurologist and advised to do so again.  Do not drive when taking Pain medications.  Do not take more than prescribed Pain, Sleep and Anxiety Medications  Wear Seat belts while driving.   Please note You were cared for by a hospitalist during your hospital stay. If you have any questions about your discharge medications or the care you received while you were in the hospital after you are discharged, you can call the unit and asked to speak with the hospitalist on call if the hospitalist that took care of you is not available. Once you are discharged, your primary care physician will handle any further medical issues. Please note that NO REFILLS for any discharge medications will be authorized once you are discharged, as it is imperative that you return to your primary care physician (or establish a relationship with a primary care physician if you do not have one) for your aftercare needs so that they can reassess your need for medications and monitor your lab values.    Follow ups:    Follow-up Information     [facility] Follow up.   Specialty: Home Health Services Why: Someone will call you to schedule first home visit. Contact information: [address] [place] [address] [phone]         Pincus Sanes, MD Follow up.   Specialty: Internal Medicine Contact information: [address] [place] [address] [phone]         Kathleene Hazel, MD .   Specialty: Cardiology Contact information: [address] [place] [address] [phone]                 Wound care:     Discharge Exam:   Vitals:   11/02/20 0849 11/02/20 2042  11/03/20 0459 11/03/20 0959  BP: (!) 151/73 (!) 107/54 (!) 149/77 (!) 119/57  Pulse: (!) 57 80 65 78  Resp: 19 18 20 15   Temp: (!) 97.5 F (36.4 C) 99.1 F (37.3 C) 98 F (36.7 C) 97.7 F (36.5 C)  TempSrc: Oral Oral Oral Oral  SpO2: 100% 100% 100% 99%  Weight:      Height:        Body mass index is 25.55 kg/m.  General exam: Pleasant, elderly African-American female.  Not in distress Skin: No rashes, lesions or ulcers. HEENT: Atraumatic, normocephalic, no obvious bleeding Lungs: Clear to auscultation bilaterally CVS: Regular rate and rhythm, no murmur. GI/Abd soft, nontender, nondistended, bowel sound present CNS: Alert, awake and slow to respond, knows she is in the hospital Psychiatry: Mood appropriate Extremities: No pedal edema, no calf tenderness    Time coordinating discharge: 35 minutes   The results of significant diagnostics from this hospitalization (including imaging, microbiology, ancillary and laboratory) are listed below for reference.    Procedures and Diagnostic Studies:   CT ABDOMEN PELVIS W CONTRAST  Result Date: 10/30/2020 CLINICAL DATA:  Abdominal pain. EXAM: CT ABDOMEN AND PELVIS WITH CONTRAST TECHNIQUE: Multidetector CT imaging of the abdomen and pelvis was performed using the standard protocol following bolus administration of intravenous contrast. CONTRAST:  75mL OMNIPAQUE IOHEXOL 300 MG/ML  SOLN COMPARISON:  None. FINDINGS: Lower chest: Small focus of calcification along the right hemidiaphragm. The visualized lung bases are otherwise clear. There is mild cardiomegaly. Coronary vascular calcification. No intra-abdominal free air or free fluid. Hepatobiliary: The liver is unremarkable. No intrahepatic biliary dilatation. The gallbladder is unremarkable. Pancreas: Unremarkable. No pancreatic ductal dilatation or surrounding inflammatory changes. Spleen: Normal in size without focal abnormality. Adrenals/Urinary Tract: Left adrenal thickening. The right  adrenal glands unremarkable. There is a 15 mm stone in the upper pole of the right kidney. An 8 mm calculus noted in the inferior pole of the right kidney. There is a 3.5 cm cyst in the inferior pole of the right kidney. Subcentimeter left renal hypodense lesion is too small to characterize, likely cysts. There is no hydronephrosis on either side. There is symmetric enhancement and excretion of contrast by both kidneys. The visualized ureters and the urinary bladder appear unremarkable. Stomach/Bowel: There is severe sigmoid diverticulosis. Large stool noted in the rectal vault. There is no bowel obstruction or active inflammation. Vascular/Lymphatic: Advanced aortoiliac atherosclerotic disease. The IVC is unremarkable. No portal venous gas. There is no adenopathy. Reproductive: Hysterectomy. Other: None Musculoskeletal: Osteopenia with degenerative changes of the spine and hips. No acute osseous pathology. IMPRESSION: 1. No acute intra-abdominal or pelvic pathology. 2. Severe sigmoid diverticulosis. No bowel obstruction. 3. Large fecal matter within the rectal vault may represent impacted stool. 4. Nonobstructing right renal calculi. No hydronephrosis. 5. Aortic Atherosclerosis (ICD10-I70.0). Electronically Signed   By: Elgie Collard M.D.   On: 10/30/2020 02:56     Labs:   Basic Metabolic Panel: Recent Labs  Lab 10/30/20 1928 10/31/20 0651 11/01/20 0338 11/02/20 0425 11/03/20 0301  NA 139 140 139 138 139  K 4.6 4.1 4.2 4.1 4.2  CL 101 108 109 108 111  CO2 26 25 25 22 23   GLUCOSE 129* 92 97 80 91  BUN 36* 34* 25* 19 16  CREATININE 1.72* 1.36* 1.10* 0.94 0.99  CALCIUM 9.5 8.6* 8.5* 8.5* 8.1*   GFR Estimated Creatinine Clearance: 47.7 mL/min (by C-G formula based on SCr of 0.99 mg/dL). Liver Function Tests: Recent Labs  Lab 10/29/20 2351  AST 26  ALT 18  ALKPHOS 88  BILITOT 1.2  PROT 7.8  ALBUMIN 3.9   Recent Labs  Lab 10/29/20 2351  LIPASE 31   No results for input(s):  AMMONIA in the last 168 hours. Coagulation profile No results for input(s): INR, PROTIME in the last 168 hours.  CBC: Recent Labs  Lab 10/29/20 2351 10/30/20 1928 10/31/20 0651 11/01/20 0338 11/02/20 0425 11/03/20 0301  WBC 13.7* 13.7*  10.8* 11.3* 9.3 9.6  NEUTROABS 11.1* 10.6*  --   --  5.7 5.9  HGB 13.1 12.5 11.1* 10.5* 10.3* 10.5*  HCT 40.8 39.7 34.2* 31.8* 32.2* 31.7*  MCV 102.0* 102.6* 100.6* 100.6* 102.5* 99.7  PLT 238 249 200 196 190 192   Cardiac Enzymes: No results for input(s): CKTOTAL, CKMB, CKMBINDEX, TROPONINI in the last 168 hours. BNP: Invalid input(s): POCBNP CBG: No results for input(s): GLUCAP in the last 168 hours. D-Dimer No results for input(s): DDIMER in the last 72 hours. Hgb A1c No results for input(s): HGBA1C in the last 72 hours. Lipid Profile No results for input(s): CHOL, HDL, LDLCALC, TRIG, CHOLHDL, LDLDIRECT in the last 72 hours. Thyroid function studies No results for input(s): TSH, T4TOTAL, T3FREE, THYROIDAB in the last 72 hours.  Invalid input(s): FREET3 Anemia work up No results for input(s): VITAMINB12, FOLATE, FERRITIN, TIBC, IRON, RETICCTPCT in the last 72 hours. Microbiology Recent Results (from the past 240 hour(s))  Urine Culture     Status: Abnormal   Collection Time: 10/29/20 11:22 PM   Specimen: Urine, Clean Catch  Result Value Ref Range Status   Specimen Description URINE, CLEAN CATCH  Final   Special Requests   Final    NONE Performed at [facility], [address], [place], [place] [address]    Culture (A)  Final    >=100,000 COLONIES/mL ESCHERICHIA COLI >=100,000 COLONIES/mL PROTEUS MIRABILIS    Report Status 11/02/2020 FINAL  Final   Organism ID, Bacteria ESCHERICHIA COLI (A)  Final   Organism ID, Bacteria PROTEUS MIRABILIS (A)  Final      Susceptibility   Escherichia coli - MIC*    AMPICILLIN 4 SENSITIVE Sensitive     CEFAZOLIN <=4 SENSITIVE Sensitive     CEFEPIME <=0.12 SENSITIVE Sensitive      CEFTRIAXONE <=0.25 SENSITIVE Sensitive     CIPROFLOXACIN <=0.25 SENSITIVE Sensitive     GENTAMICIN <=1 SENSITIVE Sensitive     IMIPENEM <=0.25 SENSITIVE Sensitive     NITROFURANTOIN 32 SENSITIVE Sensitive     TRIMETH/SULFA <=20 SENSITIVE Sensitive     AMPICILLIN/SULBACTAM <=2 SENSITIVE Sensitive     PIP/TAZO <=4 SENSITIVE Sensitive     * >=100,000 COLONIES/mL ESCHERICHIA COLI   Proteus mirabilis - MIC*    AMPICILLIN <=2 SENSITIVE Sensitive     CEFAZOLIN <=4 SENSITIVE Sensitive     CEFEPIME <=0.12 SENSITIVE Sensitive     CEFTRIAXONE <=0.25 SENSITIVE Sensitive     CIPROFLOXACIN <=0.25 SENSITIVE Sensitive     GENTAMICIN <=1 SENSITIVE Sensitive     IMIPENEM 2 SENSITIVE Sensitive     NITROFURANTOIN 128 RESISTANT Resistant     TRIMETH/SULFA <=20 SENSITIVE Sensitive     AMPICILLIN/SULBACTAM <=2 SENSITIVE Sensitive     PIP/TAZO <=4 SENSITIVE Sensitive     * >=100,000 COLONIES/mL PROTEUS MIRABILIS  Blood culture (routine x 2)     Status: None (Preliminary result)   Collection Time: 10/29/20 11:53 PM   Specimen: BLOOD  Result Value Ref Range Status   Specimen Description BLOOD RIGHT ANTECUBITAL  Final   Special Requests   Final    BOTTLES DRAWN AEROBIC AND ANAEROBIC Blood Culture adequate volume   Culture   Final    NO GROWTH 4 DAYS Performed at [facility], [address] [place], [place] [address]    Report Status PENDING  Incomplete  Blood culture (routine x 2)     Status: Abnormal   Collection Time: 10/29/20 11:54 PM   Specimen:  BLOOD  Result Value Ref Range Status   Specimen Description BLOOD LEFT ANTECUBITAL  Final   Special Requests   Final    BOTTLES DRAWN AEROBIC AND ANAEROBIC Blood Culture adequate volume   Culture  Setup Time   Final    GRAM POSITIVE COCCI ANAEROBIC BOTTLE ONLY CRITICAL RESULT CALLED TO, READ BACK BY AND VERIFIED WITH: S COBLE RN 1643 10/30/20 A BROWNING GRAM NEGATIVE RODS AEROBIC BOTTLE ONLY CRITICAL RESULT CALLED TO, READ BACK BY AND VERIFIED  WITH: PHARMD MCCURER AT 0835 ON 11/01/2020 BY T.SAAD. Performed at [facility], [address] [place], [place] [address]    Culture ENTEROCOCCUS FAECALIS ACINETOBACTER LWOFFII  (A)  Final   Report Status 11/03/2020 FINAL  Final   Organism ID, Bacteria ENTEROCOCCUS FAECALIS  Final   Organism ID, Bacteria ACINETOBACTER LWOFFII  Final      Susceptibility   Acinetobacter lwoffii - MIC*    CEFTAZIDIME <=1 SENSITIVE Sensitive     CIPROFLOXACIN <=0.25 SENSITIVE Sensitive     GENTAMICIN <=1 SENSITIVE Sensitive     IMIPENEM <=0.25 SENSITIVE Sensitive     PIP/TAZO <=4 SENSITIVE Sensitive     TRIMETH/SULFA <=20 SENSITIVE Sensitive     AMPICILLIN/SULBACTAM <=2 SENSITIVE Sensitive     * ACINETOBACTER LWOFFII   Enterococcus faecalis - MIC*    AMPICILLIN <=2 SENSITIVE Sensitive     VANCOMYCIN 1 SENSITIVE Sensitive     GENTAMICIN SYNERGY SENSITIVE Sensitive     * ENTEROCOCCUS FAECALIS  Blood Culture ID Panel (Reflexed)     Status: Abnormal   Collection Time: 10/29/20 11:54 PM  Result Value Ref Range Status   Enterococcus faecalis DETECTED (A) NOT DETECTED Final    Comment: CRITICAL RESULT CALLED TO, READ BACK BY AND VERIFIED WITH: S COBLE RN 1643 10/30/20 A BROWNING    Enterococcus Faecium NOT DETECTED NOT DETECTED Final   Listeria monocytogenes NOT DETECTED NOT DETECTED Final   Staphylococcus species NOT DETECTED NOT DETECTED Final   Staphylococcus aureus (BCID) NOT DETECTED NOT DETECTED Final   Staphylococcus epidermidis NOT DETECTED NOT DETECTED Final   Staphylococcus lugdunensis NOT DETECTED NOT DETECTED Final   Streptococcus species NOT DETECTED NOT DETECTED Final   Streptococcus agalactiae NOT DETECTED NOT DETECTED Final   Streptococcus pneumoniae NOT DETECTED NOT DETECTED Final   Streptococcus pyogenes NOT DETECTED NOT DETECTED Final   A.calcoaceticus-baumannii NOT DETECTED NOT DETECTED Final   Bacteroides fragilis NOT DETECTED NOT DETECTED Final   Enterobacterales NOT  DETECTED NOT DETECTED Final   Enterobacter cloacae complex NOT DETECTED NOT DETECTED Final   Escherichia coli NOT DETECTED NOT DETECTED Final   Klebsiella aerogenes NOT DETECTED NOT DETECTED Final   Klebsiella oxytoca NOT DETECTED NOT DETECTED Final   Klebsiella pneumoniae NOT DETECTED NOT DETECTED Final   Proteus species NOT DETECTED NOT DETECTED Final   Salmonella species NOT DETECTED NOT DETECTED Final   Serratia marcescens NOT DETECTED NOT DETECTED Final   Haemophilus influenzae NOT DETECTED NOT DETECTED Final   Neisseria meningitidis NOT DETECTED NOT DETECTED Final   Pseudomonas aeruginosa NOT DETECTED NOT DETECTED Final   Stenotrophomonas maltophilia NOT DETECTED NOT DETECTED Final   Candida albicans NOT DETECTED NOT DETECTED Final   Candida auris NOT DETECTED NOT DETECTED Final   Candida glabrata NOT DETECTED NOT DETECTED Final   Candida krusei NOT DETECTED NOT DETECTED Final   Candida parapsilosis NOT DETECTED NOT DETECTED Final   Candida tropicalis NOT DETECTED NOT DETECTED Final   Cryptococcus neoformans/gattii NOT DETECTED NOT DETECTED  Final   Vancomycin resistance NOT DETECTED NOT DETECTED Final    Comment: Performed at [facility], [address] [place], [place] [address]  Resp Panel by RT-PCR (Flu A&B, Covid) Nasopharyngeal Swab     Status: None   Collection Time: 10/30/20 10:50 PM   Specimen: Nasopharyngeal Swab; Nasopharyngeal(NP) swabs in vial transport medium  Result Value Ref Range Status   SARS Coronavirus 2 by RT PCR NEGATIVE NEGATIVE Final    Comment: (NOTE) SARS-CoV-2 target nucleic acids are NOT DETECTED.  The SARS-CoV-2 RNA is generally detectable in upper respiratory specimens during the acute phase of infection. The lowest concentration of SARS-CoV-2 viral copies this assay can detect is 138 copies/mL. A negative result does not preclude SARS-Cov-2 infection and should not be used as the sole basis for treatment or other patient management  decisions. A negative result may occur with  improper specimen collection/handling, submission of specimen other than nasopharyngeal swab, presence of viral mutation(s) within the areas targeted by this assay, and inadequate number of viral copies(<138 copies/mL). A negative result must be combined with clinical observations, patient history, and epidemiological information. The expected result is Negative.  Fact Sheet for Patients:  BloggerCourse.com  Fact Sheet for Healthcare Providers:  SeriousBroker.it  This test is no t yet approved or cleared by the [place] FDA and  has been authorized for detection and/or diagnosis of SARS-CoV-2 by FDA under an Emergency Use Authorization (EUA). This EUA will remain  in effect (meaning this test can be used) for the duration of the COVID-19 declaration under Section 564(b)(1) of the Act, 21 U.S.C.section 360bbb-3(b)(1), unless the authorization is terminated  or revoked sooner.       Influenza A by PCR NEGATIVE NEGATIVE Final   Influenza B by PCR NEGATIVE NEGATIVE Final    Comment: (NOTE) The Xpert Xpress SARS-CoV-2/FLU/RSV plus assay is intended as an aid in the diagnosis of influenza from Nasopharyngeal swab specimens and should not be used as a sole basis for treatment. Nasal washings and aspirates are unacceptable for Xpert Xpress SARS-CoV-2/FLU/RSV testing.  Fact Sheet for Patients: BloggerCourse.com  Fact Sheet for Healthcare Providers: SeriousBroker.it  This test is not yet approved or cleared by the [place] FDA and has been authorized for detection and/or diagnosis of SARS-CoV-2 by FDA under an Emergency Use Authorization (EUA). This EUA will remain in effect (meaning this test can be used) for the duration of the COVID-19 declaration under Section 564(b)(1) of the Act, 21 U.S.C. section 360bbb-3(b)(1), unless the  authorization is terminated or revoked.  Performed at [facility], [address] [place], [place] [address]   Culture, blood (routine x 2)     Status: None (Preliminary result)   Collection Time: 10/31/20  6:43 AM   Specimen: BLOOD  Result Value Ref Range Status   Specimen Description BLOOD RIGHT ANTECUBITAL  Final   Special Requests   Final    BOTTLES DRAWN AEROBIC AND ANAEROBIC Blood Culture adequate volume   Culture   Final    NO GROWTH 3 DAYS Performed at [facility], [address] [place], [place] [address]    Report Status PENDING  Incomplete  Culture, blood (routine x 2)     Status: None (Preliminary result)   Collection Time: 10/31/20  6:51 AM   Specimen: BLOOD RIGHT HAND  Result Value Ref Range Status   Specimen Description BLOOD RIGHT HAND  Final   Special Requests   Final  BOTTLES DRAWN AEROBIC ONLY Blood Culture results may not be optimal due to an inadequate volume of blood received in culture bottles   Culture   Final    NO GROWTH 3 DAYS Performed at [facility], [address], [place], [place] [address]    Report Status PENDING  Incomplete     Signed: Melina Schools Joely Losier  [facility] 11/03/2020, 11:10 AM

## 2020-11-03 NOTE — Progress Notes (Signed)
[facility]    Date of Admission:  10/30/2020   Total days of antibiotics 6          ID: Yvonne Bailey is a 80 y.o. female with  polymicrobial Principal Problem:   Bacteremia due to Enterococcus Active Problems:   Essential hypertension   Atrial fibrillation (HCC)   Stage 3 chronic kidney disease (HCC)   Acute cystitis   AKI (acute kidney injury) (HCC)  Subjective: afebrile  Medications:   apixaban  5 mg Oral BID   aspirin EC  81 mg Oral Daily   atorvastatin  80 mg Oral Daily   benazepril  30 mg Oral Daily   brimonidine  1 drop Both Eyes BID   dorzolamide-timolol  1 drop Both Eyes BID   latanoprost  1 drop Both Eyes QHS   levothyroxine  88 mcg Oral QAC breakfast   metoprolol succinate  50 mg Oral Daily   mirabegron ER  25 mg Oral Daily   polyethylene glycol  17 g Oral Daily   sertraline  50 mg Oral Daily   spironolactone  25 mg Oral Daily    Objective: Vital signs in last 24 hours: Temp:  [97.7 F (36.5 C)-99.1 F (37.3 C)] 97.7 F (36.5 C) (08/26 0959) Pulse Rate:  [65-80] 78 (08/26 0959) Resp:  [15-20] 15 (08/26 0959) BP: (107-149)/(54-77) 119/57 (08/26 0959) SpO2:  [99 %-100 %] 99 % (08/26 0959) Constitutional:  oriented to person, place, and time. appears well-developed and well-nourished. No distress.  HENT: [place]/AT, PERRLA, no scleral icterus Mouth/Throat: Oropharynx is clear and moist. No oropharyngeal exudate.  Cardiovascular: Normal rate, regular rhythm and normal heart sounds. Exam reveals no gallop and no friction rub.  No murmur heard.  Pulmonary/Chest: Effort normal and breath sounds normal. No respiratory distress.  has no wheezes.  Neck = supple, no nuchal rigidity Abdominal: Soft. Bowel sounds are normal.  exhibits no distension. There is no tenderness.  Lymphadenopathy: no cervical adenopathy. No axillary adenopathy Neurological: alert and oriented to person, place, and time.  Skin: Skin is warm and dry. No rash  noted. No erythema.  Psychiatric: a normal mood and affect.  behavior is normal.   Lab Results Recent Labs    11/02/20 0425 11/03/20 0301  WBC 9.3 9.6  HGB 10.3* 10.5*  HCT 32.2* 31.7*  NA 138 139  K 4.1 4.2  CL 108 111  CO2 22 23  BUN 19 16  CREATININE 0.94 0.99     Microbiology: Blood cx -- enterococcus faecalis(amox sensitive) & acinetobacter lwoffii ( pansensitive) Studies/Results: ECHO TEE  Result Date: 11/02/2020    TRANSESOPHOGEAL ECHO REPORT   Patient Name:   Yvonne Bailey Date of Exam: 11/02/2020 Medical Rec #:  [id number]           Height:       67.0 in Accession #:    [id number]          Weight:       163.1 lb Date of Birth:  June 10, 1940            BSA:          1.855 m Patient Age:    80 years            BP:           155/78 mmHg Patient Gender: F                   HR:  73 bpm. Exam Location:  Inpatient Procedure: Transesophageal Echo, Color Doppler and Cardiac Doppler Indications:     Bacteremia  History:         Patient has prior history of Echocardiogram examinations, most                  recent 03/03/2017. CAD, Arrythmias:Atrial Fibrillation; Risk                  Factors:Hypertension and Dyslipidemia.  Sonographer:     Irving Burton Senior RDCS Referring Phys:  [id number] Corrin Parker Diagnosing Phys: Thurmon Fair MD PROCEDURE: After discussion of the risks and benefits of a TEE, an informed consent was obtained from the patient. The transesophogeal probe was passed without difficulty through the esophogus of the patient. Local oropharyngeal anesthetic was provided with Cetacaine. Sedation performed by different physician. The patient was monitored while under deep sedation. Anesthestetic sedation was provided intravenously by Anesthesiology: 115mg  of Propofol, 40mg  of Lidocaine. The patient developed no complications during the procedure. IMPRESSIONS  1. Some calcification is seen in the apical left ventricular myocardium, consistent with scar. Left ventricular  ejection fraction, by estimation, is 35 to 40%. The left ventricle has moderately decreased function. The left ventricle demonstrates regional wall motion abnormalities (see scoring diagram/findings for description). Left ventricular diastolic function could not be evaluated. There is severe hypokinesis of the left ventricular, mid-apical anteroseptal wall, anterior wall and apical segment. There is severe hypokinesis of the left ventricular, mid-apical inferior wall.  2. Right ventricular systolic function is mildly reduced. The right ventricular size is normal. There is normal pulmonary artery systolic pressure.  3. Left atrial size was moderately dilated. No left atrial/left atrial appendage thrombus was detected.  4. Right atrial size was moderately dilated.  5. The mitral valve is normal in structure. Trivial mitral valve regurgitation.  6. The aortic valve is tricuspid. Aortic valve regurgitation is mild. Mild aortic valve sclerosis is present, with no evidence of aortic valve stenosis.  7. There is Moderate (Grade III) layered plaque involving the descending aorta and transverse aorta. FINDINGS  Left Ventricle: Some calcification is seen in the apical left ventricular myocardium, consistent with scar. Left ventricular ejection fraction, by estimation, is 35 to 40%. The left ventricle has moderately decreased function. The left ventricle demonstrates regional wall motion abnormalities. Severe hypokinesis of the left ventricular, mid-apical anteroseptal wall, anterior wall and apical segment. Severe hypokinesis of the left ventricular, mid-apical inferior wall. The left ventricular internal cavity size was normal in size. There is no left ventricular hypertrophy. Left ventricular diastolic function could not be evaluated due to atrial fibrillation. Left ventricular diastolic function could not be evaluated.  LV Wall Scoring: The apex is akinetic. The mid and distal anterior wall, mid and distal anterior septum,  and mid and distal inferior wall are hypokinetic. Right Ventricle: The right ventricular size is normal. No increase in right ventricular wall thickness. Right ventricular systolic function is mildly reduced. There is normal pulmonary artery systolic pressure. The tricuspid regurgitant velocity is 1.90 m/s, and with an assumed right atrial pressure of 5 mmHg, the estimated right ventricular systolic pressure is 19.4 mmHg. Left Atrium: Left atrial size was moderately dilated. Spontaneous echo contrast was present in the left atrial appendage and left atrium. No left atrial/left atrial appendage thrombus was detected. Right Atrium: Right atrial size was moderately dilated. Pericardium: There is no evidence of pericardial effusion. Mitral Valve: The mitral valve is normal in structure. Trivial mitral valve regurgitation. Tricuspid  Valve: The tricuspid valve is normal in structure. Tricuspid valve regurgitation is mild. Aortic Valve: The aortic valve is tricuspid. Aortic valve regurgitation is mild. Mild aortic valve sclerosis is present, with no evidence of aortic valve stenosis. Pulmonic Valve: The pulmonic valve was normal in structure. Pulmonic valve regurgitation is trivial. Aorta: The aortic root, ascending aorta, aortic arch and descending aorta are all structurally normal, with no evidence of dilitation or obstruction. There is moderate (Grade III) layered plaque involving the descending aorta and transverse aorta. IAS/Shunts: No atrial level shunt detected by color flow Doppler.  TRICUSPID VALVE TR Peak grad:   14.4 mmHg TR Vmax:        190.00 cm/s Rachelle Hora Croitoru MD Electronically signed by Thurmon Fair MD Signature Date/Time: 11/02/2020/10:06:46 AM    Final      Assessment/Plan: Polymicrobial bacteremia- only in 1 of 4 bottles, suspect transient bacteremia from GI source. TEE negative for endocarditis. Will switch to oral abtx to complete course.   For enterococcus = will switch to amoxicillin and needs  5 more days For acinetobacter = will switch to bactrim ds 1 bid x 5 days  Will sign off  [facility] Cell: [phone] Pager: [phone]  11/03/2020, 10:36 AM

## 2020-11-03 NOTE — Progress Notes (Signed)
Occupational Therapy Treatment Patient Details Name: Yvonne Bailey MRN: [id number] DOB: Mar 26, 1940 Today's Date: 11/03/2020    History of present illness Pt is a 80 y.o. F who presents to ED 8/22 with complaints of lower abdominal discomfort. Blood cultures were drawn. Pt was discharged home on oral Keflex for presumed UTI.  Same evening, she was called back to come to the ED for positive blood cultures growing Enterococcus faecalis. TEE negative for endocarditis. Significant PMH: dementia, HTN, CAD s/p CABG, paroxysmal Afib, ischemic cardiomyopathy, AAA, CKD 3.   OT comments  Patient seen by skilled OT to address bed mobility, grooming, toileting, and transfers. Patient required frequent vcs due to vision deficits.  Patient was supervision to get to eob from supine min guard for mobility, standing, and transfers.  Patient required mod assist for toilet hygiene due to thoroughness and required assistance to complete donning non-skid socks. Patient tolerated treatment well.   Follow Up Recommendations  No OT follow up;Supervision/Assistance - 24 hour    Equipment Recommendations  None recommended by OT    Recommendations for Other Services      Precautions / Restrictions Precautions Precautions: Fall       Mobility Bed Mobility Overal bed mobility: Modified Independent Bed Mobility: Supine to Sit     Supine to sit: HOB elevated;Supervision     General bed mobility comments:  (HOB up with vcs)    Transfers Overall transfer level: Needs assistance Equipment used: Rolling walker (2 wheeled) Transfers: Sit to/from Stand Sit to Stand: Min guard         General transfer comment:  (min guard to assist with body mechanics)    Balance Overall balance assessment: Needs assistance Sitting-balance support: Feet supported Sitting balance-Leahy Scale: Good     Standing balance support: No upper extremity supported (during grooming) Standing balance-Leahy Scale:  Fair Standing balance comment:  (min guard while using BUE during grooming)                           ADL either performed or assessed with clinical judgement   ADL Overall ADL's : Needs assistance/impaired     Grooming: Wash/dry hands;Wash/dry face;Oral care;Standing;Min guard Grooming Details (indicate cue type and reason):  (VCS for sequencing and location of 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)             Lower Body Dressing: Minimal assistance;Sitting/lateral leans Lower Body Dressing Details (indicate cue type and reason):  (min assist to donn and doff footwear) Toilet Transfer: Min guard;Cueing for safety;Regular Toilet;Grab bars;RW Statistician Details (indicate cue type and reason):  (vcs to use grab bars) Toileting- Clothing Manipulation and Hygiene: Moderate assistance (for thoroughness)       Functional mobility during ADLs: Min guard;Rolling walker General ADL Comments:  (Patient was unable she was having a BM while standing and was cued to use toilet)     Vision       Perception     Praxis      Cognition Arousal/Alertness: Awake/alert Behavior During Therapy: WFL for tasks assessed/performed Overall Cognitive Status: History of cognitive impairments - at baseline                                 General Comments: History of dementia per chart review. Pt following all commands        Exercises     Shoulder Instructions  General Comments      Pertinent Vitals/ Pain       Pain Assessment: No/denies pain  Home Living                                          Prior Functioning/Environment              Frequency  Min 2X/week        Progress Toward Goals  OT Goals(current goals can now be found in the care plan section)  Progress towards OT goals: Progressing toward goals  Acute Rehab OT Goals Patient Stated Goal: return home and back to senior center OT Goal Formulation: With  patient Time For Goal Achievement: 11/16/20 Potential to Achieve Goals: Good ADL Goals Pt Will Perform Grooming: with supervision;standing Pt Will Perform Lower Body Bathing: with supervision;sit to/from stand Pt Will Perform Lower Body Dressing: with supervision;sit to/from stand Pt Will Transfer to Toilet: with supervision;ambulating;bedside commode Pt Will Perform Toileting - Clothing Manipulation and hygiene: with supervision;sit to/from stand  Plan Discharge plan remains appropriate    Co-evaluation                 AM-PAC OT "6 Clicks" Daily Activity     Outcome Measure   Help from another person eating meals?: None Help from another person taking care of personal grooming?: A Little Help from another person toileting, which includes using toliet, bedpan, or urinal?: A Little Help from another person bathing (including washing, rinsing, drying)?: A Little Help from another person to put on and taking off regular upper body clothing?: None Help from another person to put on and taking off regular lower body clothing?: A Little 6 Click Score: 20    End of Session    OT Visit Diagnosis: Unsteadiness on feet (R26.81);Other abnormalities of gait and mobility (R26.89);Other symptoms and signs involving cognitive function;Muscle weakness (generalized) (M62.81)   Activity Tolerance Patient tolerated treatment well   Patient Left in chair;with call bell/phone within reach;with chair alarm set   Nurse Communication Mobility status        Time: 7902-4097 OT Time Calculation (min): 36 min  Charges: OT General Charges $OT Visit: 1 Visit OT Treatments $Self Care/Home Management : 23-37 mins  Alfonse Flavors, OTA    Yvonne Bailey Senior 11/03/2020, 11:52 AM

## 2020-11-03 NOTE — Progress Notes (Signed)
Physical Therapy Treatment Patient Details Name: Yvonne Bailey MRN: [id number] DOB: May 18, 1940 Today's Date: 11/03/2020    History of Present Illness Pt is a 80 y.o. F who presents to ED 8/22 with complaints of lower abdominal discomfort. Blood cultures were drawn. Pt was discharged home on oral Keflex for presumed UTI.  Same evening, she was called back to come to the ED for positive blood cultures growing Enterococcus faecalis. TEE negative for endocarditis. Significant PMH: dementia, HTN, CAD s/p CABG, paroxysmal Afib, ischemic cardiomyopathy, AAA, CKD 3.    PT Comments    Pt tolerates treatment well with increased ambulation distance this session. Pt found incontinent of stool this session, assisted in hygiene tasks without loss of balance. Pt does report fatigue with mobility, PT encourages continued gradual progression of mobility distance and frequency to aide in improving activity tolerance. PT continues to recommend HHPT.  Follow Up Recommendations  Home health PT;Supervision/Assistance - 24 hour     Equipment Recommendations  3in1 (PT)    Recommendations for Other Services       Precautions / Restrictions Precautions Precautions: Fall Restrictions Weight Bearing Restrictions: No    Mobility  Bed Mobility Overal bed mobility: Modified Independent Bed Mobility: Supine to Sit     Supine to sit: HOB elevated;Supervision     General bed mobility comments:  (HOB up with vcs)    Transfers Overall transfer level: Needs assistance Equipment used: Rolling walker (2 wheeled) Transfers: Sit to/from [employer] Sit to Stand: Supervision Stand pivot transfers: Supervision       General transfer comment:  (min guard to assist with body mechanics)  Ambulation/Gait Ambulation/Gait assistance: Supervision Gait Distance (Feet): 60 Feet Assistive device: Rolling walker (2 wheeled) Gait Pattern/deviations: Step-through pattern Gait velocity:  reduced Gait velocity interpretation: <1.8 ft/sec, indicate of risk for recurrent falls General Gait Details: pt with steady step-through gait, pt does bump into wall once but is able to self-correct and does not lose balance   Stairs             Wheelchair Mobility    Modified Rankin (Stroke Patients Only)       Balance Overall balance assessment: Needs assistance Sitting-balance support: No upper extremity supported;Feet supported Sitting balance-Leahy Scale: Good     Standing balance support: Single extremity supported Standing balance-Leahy Scale: Poor Standing balance comment: pt utilizing unilateral UE support of RW when performing hygiene tasks in standing                            Cognition Arousal/Alertness: Awake/alert Behavior During Therapy: WFL for tasks assessed/performed Overall Cognitive Status: History of cognitive impairments - at baseline                                 General Comments: History of dementia per chart review. Pt following all commands and participates well in session. Some reduced awareness of bowel incontinence.      Exercises      General Comments General comments (skin integrity, edema, etc.): VSS on RA, pt incontinent of bowel upon arrival      Pertinent Vitals/Pain Pain Assessment: No/denies pain    Home Living                      Prior Function            PT Goals (current  goals can now be found in the care plan section) Acute Rehab PT Goals Patient Stated Goal: return home and back to senior center Progress towards PT goals: Progressing toward goals    Frequency    Min 3X/week      PT Plan Current plan remains appropriate    Co-evaluation              AM-PAC PT "6 Clicks" Mobility   Outcome Measure  Help needed turning from your back to your side while in a flat bed without using bedrails?: A Little Help needed moving from lying on your back to sitting on  the side of a flat bed without using bedrails?: A Little Help needed moving to and from a bed to a chair (including a wheelchair)?: A Little Help needed standing up from a chair using your arms (e.g., wheelchair or bedside chair)?: A Little Help needed to walk in hospital room?: A Little Help needed climbing 3-5 steps with a railing? : A Lot 6 Click Score: 17    End of Session   Activity Tolerance: Patient tolerated treatment well Patient left: in chair;with call bell/phone within reach;with chair alarm set Nurse Communication: Mobility status PT Visit Diagnosis: Unsteadiness on feet (R26.81);Muscle weakness (generalized) (M62.81)     Time: 7989-2119 PT Time Calculation (min) (ACUTE ONLY): 27 min  Charges:  $Gait Training: 8-22 mins $Therapeutic Activity: 8-22 mins                     Arlyss Gandy, PT, DPT Acute Rehabilitation Pager: [phone]    Arlyss Gandy 11/03/2020, 1:32 PM

## 2020-11-03 NOTE — TOC Transition Note (Signed)
Transition of Care [facility]) - CM/SW Discharge Note   Patient Details  Name: Yvonne Bailey MRN: [id number] Date of Birth: 06-12-1940  Transition of Care [facility] CM/SW Contact:  Tom-Johnson, Hershal Coria, RN Phone Number: 11/03/2020, 3:05 PM   Clinical Narrative:    Patient is set up with [facility] for PT/OT/Aide services. Son will pick up at discharge. No further TOC needs at this time.    Final next level of care: Home w Home Health Services Barriers to Discharge: No Barriers Identified   Patient Goals and CMS Choice Patient states their goals for this hospitalization and ongoing recovery are:: To go home CMS Medicare.gov Compare Post Acute Care list provided to:: Patient Choice offered to / list presented to : Patient  Discharge Placement                       Discharge Plan and Services In-house Referral: Clinical Social Work Discharge Planning Services: CM Consult Post Acute Care Choice: Home Health                    HH Arranged: PT [facility] Agency: [name] Date [facility] Agency Contacted: 11/02/20 Time HH Agency Contacted: [phone] Representative spoke with at [facility] Agency: Kandee Keen  Social Determinants of Health (SDOH) Interventions     Readmission Risk Interventions No flowsheet data found.

## 2020-11-03 NOTE — Progress Notes (Signed)
DISCHARGE NOTE HOME LITITIA SEN to be discharged Home per MD order. Discussed prescriptions and follow up appointments with the patient. Prescriptions given to patient; medication list explained in detail. Patient verbalized understanding.  Skin clean, dry and intact without evidence of skin break down, no evidence of skin tears noted. IV catheter discontinued intact. Site without signs and symptoms of complications. Dressing and pressure applied. Pt denies pain at the site currently. No complaints noted.  Patient free of lines, drains, and wounds.   An After Visit Summary (AVS) was printed and given to the patient. Patient escorted via wheelchair, and discharged home via private auto.  Dillian Feig S Vernia Teem, RN

## 2020-11-03 NOTE — Telephone Encounter (Signed)
Post ED Visit - Positive Culture Follow-up  Culture report reviewed by antimicrobial stewardship pharmacist: [facility] []  , Pharm.D. []  Enzo Bi, Pharm.D., BCPS AQ-ID []  , Pharm.D., BCPS []  Celedonio Miyamoto, Pharm.D., BCPS []  [place], Garvin Fila.D., BCPS, AAHIVP []  , Pharm.D., BCPS, AAHIVP []  Georgina Pillion, PharmD, BCPS []  , PharmD, BCPS []  [place], PharmD, BCPS [x]  [place], PharmD []  , PharmD, BCPS []  Estella Husk, PharmD  Pharmacy Team []  Lysle Pearl, PharmD []  , PharmD []  Phillips Climes, PharmD []  , Rph []  Agapito Games) , PharmD []  Shirlee More, PharmD []  , PharmD []  Mervyn Gay, PharmD []  , PharmD []  Vinnie Level, PharmD []  Wonda Olds, PharmD []  , PharmD []  Len Childs, PharmD   Positive urine culture Treated with Cephalexin, organism sensitive to the same and no further patient follow-up is required at this time.  Doria Fern 11/03/2020, 11:04 AM

## 2020-11-03 NOTE — Progress Notes (Signed)
PROGRESS NOTE  Yvonne Bailey  DOB: Jul 02, 1940  PCP: Pincus Sanes, MD URK:[id number]  DOA: 10/30/2020  LOS: 3 days  Hospital Day: 5   Chief complaint: Abdominal discomfort  Brief narrative: Yvonne Bailey is a 80 y.o. female with PMH significant for dementia, HTN, CAD s/p CABG, paroxysmal Afib on Eliquis, ischemic cardiomyopathy with EF 20 to 25%, improved to 40 to 45%, AAA, CKD 3  Patient presented to the ED on 8/22 with complaint of lower abdominal discomfort.  Blood cultures were drawn.  Patient was discharged home on oral Keflex for presumed UTI.  Same evening, she was called back to come to the ED for positive blood cultures growing Enterococcus faecalis.   Admitted to hospital service ID consult was obtained  Subjective: Patient was seen and examined this morning.  Propped up in bed.  Not in distress.  No new symptoms.  Pending final sensitivity of the blood culture report.  Assessment/Plan: Enterococcus faecalis bacteremia -Presumed to be secondary to UTI -Urine cultures from 8/21 with> 100 K GNR, follow-up sensitivities -Repeat blood cultures sent on 8/23, pending report. -Continue IV ampicillin, per ID recommendation -Abdominal pain improving.  CT abdomen pelvis is unremarkable for acute findings -Underwent TEE on 8/25.  No evidence of endocarditis.   -Discussed with ID Dr. Ilsa Iha.  Pending final blood culture and sensitivity.   Constipation, fecal impaction -Continue laxatives   History of chronic systolic CHF/ischemic cardiomyopathy Essential hypertension -Home meds include Toprol 50 mg daily, amlodipine 2.5 mg daily, benazepril 30 mg daily, Lasix 20 mg daily, Aldactone 25 mg daily -EF per TEE on 10/2533 to 40%. -Currently patient is on metoprolol and benazepril.  Resume Aldactone today.  Plan to resume Lasix 20 mg daily as needed at home.     AKI on CKD 3b -creatinine improved back to normal.. Recent Labs    03/28/20 1552 06/07/20 2235  09/08/20 1834 10/17/20 0915 10/29/20 2351 10/30/20 1928 10/31/20 0651 11/01/20 0338 11/02/20 0425 11/03/20 0301  BUN 23 27* 28* 49* 30* 36* 34* 25* 19 16  CREATININE 1.09 1.31* 1.37* 2.15* 1.46* 1.72* 1.36* 1.10* 0.94 0.99   Paroxysmal atrial fibrillation -In sinus rhythm, continue metoprolol and Eliquis   History of CAD/PCI multiple stents -Stable, continue aspirin, statin, metoprolol   Memory/cognitive deficits -Stable at baseline.     Mobility: PT/OT eval appreciated Code Status:   Code Status: Full Code  Nutritional status: Body mass index is 25.55 kg/m.     Diet:  Diet Order             Diet Heart Room service appropriate? Yes; Fluid consistency: Thin  Diet effective now                  DVT prophylaxis:   apixaban (ELIQUIS) tablet 5 mg   Antimicrobials: IV ampicillin Fluid: None Consultants: ID Family Communication: None at bedside  Status is: Inpatient  Remains inpatient appropriate because: On IV antibiotics, pending final sensitivity data.  Dispo: The patient is from: Home              Anticipated d/c is to: Hopefully home today or tomorrow              Patient currently is not medically stable to d/c.   Difficult to place patient No     Infusions:   ampicillin-sulbactam (UNASYN) IV 3 g (11/03/20 0551)    Scheduled Meds:  apixaban  5 mg Oral BID   aspirin EC  81 mg  Oral Daily   atorvastatin  80 mg Oral Daily   benazepril  30 mg Oral Daily   brimonidine  1 drop Both Eyes BID   dorzolamide-timolol  1 drop Both Eyes BID   latanoprost  1 drop Both Eyes QHS   levothyroxine  88 mcg Oral QAC breakfast   metoprolol succinate  50 mg Oral Daily   mirabegron ER  25 mg Oral Daily   polyethylene glycol  17 g Oral Daily   sertraline  50 mg Oral Daily   spironolactone  25 mg Oral Daily    Antimicrobials: Anti-infectives (From admission, onward)    Start     Dose/Rate Route Frequency Ordered Stop   11/01/20 1200  Ampicillin-Sulbactam  (UNASYN) 3 g in sodium chloride 0.9 % 100 mL IVPB        3 g 200 mL/hr over 30 Minutes Intravenous Every 6 hours 11/01/20 1006     10/31/20 2200  ampicillin (OMNIPEN) 2 g in sodium chloride 0.9 % 100 mL IVPB  Status:  Discontinued        2 g 300 mL/hr over 20 Minutes Intravenous Every 6 hours 10/31/20 1538 11/01/20 1006   10/31/20 0600  ampicillin (OMNIPEN) 1 g in sodium chloride 0.9 % 100 mL IVPB  Status:  Discontinued        1 g 300 mL/hr over 20 Minutes Intravenous Every 6 hours 10/31/20 0034 10/31/20 0047   10/31/20 0600  ampicillin (OMNIPEN) 2 g in sodium chloride 0.9 % 100 mL IVPB  Status:  Discontinued        2 g 300 mL/hr over 20 Minutes Intravenous Every 8 hours 10/31/20 0047 10/31/20 1538   10/30/20 2245  vancomycin (VANCOREADY) IVPB 1500 mg/300 mL  Status:  Discontinued        1,500 mg 150 mL/hr over 120 Minutes Intravenous  Once 10/30/20 2231 10/30/20 2232   10/30/20 2245  ampicillin (OMNIPEN) 2 g in sodium chloride 0.9 % 100 mL IVPB        2 g 300 mL/hr over 20 Minutes Intravenous  Once 10/30/20 2233 10/30/20 2339       PRN meds: acetaminophen **OR** acetaminophen, ondansetron **OR** ondansetron (ZOFRAN) IV   Objective: Vitals:   11/02/20 2042 11/03/20 0459  BP: (!) 107/54 (!) 149/77  Pulse: 80 65  Resp: 18 20  Temp: 99.1 F (37.3 C) 98 F (36.7 C)  SpO2: 100% 100%    Intake/Output Summary (Last 24 hours) at 11/03/2020 0931 Last data filed at 11/03/2020 0800 Gross per 24 hour  Intake 1175 ml  Output 1300 ml  Net -125 ml   Filed Weights   11/01/20 2031 11/02/20 0715  Weight: 74 kg 74 kg   Weight change: 0.035 kg Body mass index is 25.55 kg/m.   Physical Exam: General exam: Pleasant, elderly African-American female.  Not in distress Skin: No rashes, lesions or ulcers. HEENT: Atraumatic, normocephalic, no obvious bleeding Lungs: Clear to auscultation bilaterally CVS: Regular rate and rhythm, no murmur. GI/Abd soft, nontender, nondistended, bowel sound  present CNS: Alert, awake and slow to respond, knows she is in the hospital Psychiatry: Mood appropriate Extremities: No pedal edema, no calf tenderness  Data Review: I have personally reviewed the laboratory data and studies available.  Recent Labs  Lab 10/29/20 2351 10/30/20 1928 10/31/20 0651 11/01/20 0338 11/02/20 0425 11/03/20 0301  WBC 13.7* 13.7* 10.8* 11.3* 9.3 9.6  NEUTROABS 11.1* 10.6*  --   --  5.7 5.9  HGB 13.1 12.5 11.1*  10.5* 10.3* 10.5*  HCT 40.8 39.7 34.2* 31.8* 32.2* 31.7*  MCV 102.0* 102.6* 100.6* 100.6* 102.5* 99.7  PLT 238 249 200 196 190 192   Recent Labs  Lab 10/30/20 1928 10/31/20 0651 11/01/20 0338 11/02/20 0425 11/03/20 0301  NA 139 140 139 138 139  K 4.6 4.1 4.2 4.1 4.2  CL 101 108 109 108 111  CO2 26 25 25 22 23   GLUCOSE 129* 92 97 80 91  BUN 36* 34* 25* 19 16  CREATININE 1.72* 1.36* 1.10* 0.94 0.99  CALCIUM 9.5 8.6* 8.5* 8.5* 8.1*    F/u labs ordered Unresulted Labs (From admission, onward)     Start     Ordered   11/02/20 0500  CBC with Differential/Platelet  Daily,   R      11/01/20 1028   11/02/20 0500  Basic metabolic panel  Daily,   R      11/01/20 1028   10/31/20 0033  Urine Culture  Once,   STAT       Comments: Bacteremia of enterococcus   Question:  Indication  Answer:  Dysuria   10/31/20 0034            Signed, 11/02/20, MD [facility] 11/03/2020

## 2020-11-03 NOTE — Care Management Important Message (Signed)
Important Message  Patient Details  Name: Yvonne Bailey MRN: [id number] Date of Birth: [birth year]   Medicare Important Message Given:  Yes     Dorena Bodo 11/03/2020, 4:31 PM

## 2020-11-04 LAB — CULTURE, BLOOD (ROUTINE X 2)
Culture: NO GROWTH
Special Requests: ADEQUATE

## 2020-11-04 NOTE — Progress Notes (Signed)
ED Antimicrobial Stewardship Positive Culture Follow Up   Yvonne Bailey is an 80 y.o. female who presented to [facility] on 10/30/2020 with a chief complaint of No chief complaint on file.   Recent Results (from the past 720 hour(s))  Urine Culture     Status: None   Collection Time: 10/17/20  9:15 AM   Specimen: Urine  Result Value Ref Range Status   Source: NOT GIVEN  Final   Status: FINAL  Final   Isolate 1:   Final    Mixed genital flora isolated. These superficial bacteria are not indicative of a urinary tract infection. No further organism identification is warranted on this specimen. If clinically indicated, recollect clean-catch, mid-stream urine and transfer  immediately to Urine Culture Transport Tube.   Urine Culture     Status: Abnormal   Collection Time: 10/29/20 11:22 PM   Specimen: Urine, Clean Catch  Result Value Ref Range Status   Specimen Description URINE, CLEAN CATCH  Final   Special Requests   Final    NONE Performed at [facility], [address], [place], [place] [address]    Culture (A)  Final    >=100,000 COLONIES/mL ESCHERICHIA COLI >=100,000 COLONIES/mL PROTEUS MIRABILIS    Report Status 11/02/2020 FINAL  Final   Organism ID, Bacteria ESCHERICHIA COLI (A)  Final   Organism ID, Bacteria PROTEUS MIRABILIS (A)  Final      Susceptibility   Escherichia coli - MIC*    AMPICILLIN 4 SENSITIVE Sensitive     CEFAZOLIN <=4 SENSITIVE Sensitive     CEFEPIME <=0.12 SENSITIVE Sensitive     CEFTRIAXONE <=0.25 SENSITIVE Sensitive     CIPROFLOXACIN <=0.25 SENSITIVE Sensitive     GENTAMICIN <=1 SENSITIVE Sensitive     IMIPENEM <=0.25 SENSITIVE Sensitive     NITROFURANTOIN 32 SENSITIVE Sensitive     TRIMETH/SULFA <=20 SENSITIVE Sensitive     AMPICILLIN/SULBACTAM <=2 SENSITIVE Sensitive     PIP/TAZO <=4 SENSITIVE Sensitive     * >=100,000 COLONIES/mL ESCHERICHIA COLI   Proteus mirabilis - MIC*    AMPICILLIN <=2 SENSITIVE Sensitive     CEFAZOLIN <=4  SENSITIVE Sensitive     CEFEPIME <=0.12 SENSITIVE Sensitive     CEFTRIAXONE <=0.25 SENSITIVE Sensitive     CIPROFLOXACIN <=0.25 SENSITIVE Sensitive     GENTAMICIN <=1 SENSITIVE Sensitive     IMIPENEM 2 SENSITIVE Sensitive     NITROFURANTOIN 128 RESISTANT Resistant     TRIMETH/SULFA <=20 SENSITIVE Sensitive     AMPICILLIN/SULBACTAM <=2 SENSITIVE Sensitive     PIP/TAZO <=4 SENSITIVE Sensitive     * >=100,000 COLONIES/mL PROTEUS MIRABILIS  Blood culture (routine x 2)     Status: None   Collection Time: 10/29/20 11:53 PM   Specimen: BLOOD  Result Value Ref Range Status   Specimen Description BLOOD RIGHT ANTECUBITAL  Final   Special Requests   Final    BOTTLES DRAWN AEROBIC AND ANAEROBIC Blood Culture adequate volume   Culture   Final    NO GROWTH 5 DAYS Performed at [facility], [address] [place], [place] [address]    Report Status 11/04/2020 FINAL  Final  Blood culture (routine x 2)     Status: Abnormal   Collection Time: 10/29/20 11:54 PM   Specimen: BLOOD  Result Value Ref Range Status   Specimen Description BLOOD LEFT ANTECUBITAL  Final   Special Requests   Final    BOTTLES DRAWN AEROBIC AND ANAEROBIC Blood Culture  adequate volume   Culture  Setup Time   Final    GRAM POSITIVE COCCI ANAEROBIC BOTTLE ONLY CRITICAL RESULT CALLED TO, READ BACK BY AND VERIFIED WITH: S COBLE RN 1643 10/30/20 A BROWNING GRAM NEGATIVE RODS AEROBIC BOTTLE ONLY CRITICAL RESULT CALLED TO, READ BACK BY AND VERIFIED WITH: PHARMD MCCURER AT 0835 ON 11/01/2020 BY T.SAAD. Performed at [facility], [address] [place], [place] [address]    Culture ENTEROCOCCUS FAECALIS ACINETOBACTER LWOFFII  (A)  Final   Report Status 11/03/2020 FINAL  Final   Organism ID, Bacteria ENTEROCOCCUS FAECALIS  Final   Organism ID, Bacteria ACINETOBACTER LWOFFII  Final      Susceptibility   Acinetobacter lwoffii - MIC*    CEFTAZIDIME <=1 SENSITIVE Sensitive     CIPROFLOXACIN <=0.25 SENSITIVE Sensitive      GENTAMICIN <=1 SENSITIVE Sensitive     IMIPENEM <=0.25 SENSITIVE Sensitive     PIP/TAZO <=4 SENSITIVE Sensitive     TRIMETH/SULFA <=20 SENSITIVE Sensitive     AMPICILLIN/SULBACTAM <=2 SENSITIVE Sensitive     * ACINETOBACTER LWOFFII   Enterococcus faecalis - MIC*    AMPICILLIN <=2 SENSITIVE Sensitive     VANCOMYCIN 1 SENSITIVE Sensitive     GENTAMICIN SYNERGY SENSITIVE Sensitive     * ENTEROCOCCUS FAECALIS  Blood Culture ID Panel (Reflexed)     Status: Abnormal   Collection Time: 10/29/20 11:54 PM  Result Value Ref Range Status   Enterococcus faecalis DETECTED (A) NOT DETECTED Final    Comment: CRITICAL RESULT CALLED TO, READ BACK BY AND VERIFIED WITH: S COBLE RN 1643 10/30/20 A BROWNING    Enterococcus Faecium NOT DETECTED NOT DETECTED Final   Listeria monocytogenes NOT DETECTED NOT DETECTED Final   Staphylococcus species NOT DETECTED NOT DETECTED Final   Staphylococcus aureus (BCID) NOT DETECTED NOT DETECTED Final   Staphylococcus epidermidis NOT DETECTED NOT DETECTED Final   Staphylococcus lugdunensis NOT DETECTED NOT DETECTED Final   Streptococcus species NOT DETECTED NOT DETECTED Final   Streptococcus agalactiae NOT DETECTED NOT DETECTED Final   Streptococcus pneumoniae NOT DETECTED NOT DETECTED Final   Streptococcus pyogenes NOT DETECTED NOT DETECTED Final   A.calcoaceticus-baumannii NOT DETECTED NOT DETECTED Final   Bacteroides fragilis NOT DETECTED NOT DETECTED Final   Enterobacterales NOT DETECTED NOT DETECTED Final   Enterobacter cloacae complex NOT DETECTED NOT DETECTED Final   Escherichia coli NOT DETECTED NOT DETECTED Final   Klebsiella aerogenes NOT DETECTED NOT DETECTED Final   Klebsiella oxytoca NOT DETECTED NOT DETECTED Final   Klebsiella pneumoniae NOT DETECTED NOT DETECTED Final   Proteus species NOT DETECTED NOT DETECTED Final   Salmonella species NOT DETECTED NOT DETECTED Final   Serratia marcescens NOT DETECTED NOT DETECTED Final   Haemophilus influenzae  NOT DETECTED NOT DETECTED Final   Neisseria meningitidis NOT DETECTED NOT DETECTED Final   Pseudomonas aeruginosa NOT DETECTED NOT DETECTED Final   Stenotrophomonas maltophilia NOT DETECTED NOT DETECTED Final   Candida albicans NOT DETECTED NOT DETECTED Final   Candida auris NOT DETECTED NOT DETECTED Final   Candida glabrata NOT DETECTED NOT DETECTED Final   Candida krusei NOT DETECTED NOT DETECTED Final   Candida parapsilosis NOT DETECTED NOT DETECTED Final   Candida tropicalis NOT DETECTED NOT DETECTED Final   Cryptococcus neoformans/gattii NOT DETECTED NOT DETECTED Final   Vancomycin resistance NOT DETECTED NOT DETECTED Final    Comment: Performed at [facility], [address] [place], [place] [address]  Resp Panel by RT-PCR (Flu  A&B, Covid) Nasopharyngeal Swab     Status: None   Collection Time: 10/30/20 10:50 PM   Specimen: Nasopharyngeal Swab; Nasopharyngeal(NP) swabs in vial transport medium  Result Value Ref Range Status   SARS Coronavirus 2 by RT PCR NEGATIVE NEGATIVE Final    Comment: (NOTE) SARS-CoV-2 target nucleic acids are NOT DETECTED.  The SARS-CoV-2 RNA is generally detectable in upper respiratory specimens during the acute phase of infection. The lowest concentration of SARS-CoV-2 viral copies this assay can detect is 138 copies/mL. A negative result does not preclude SARS-Cov-2 infection and should not be used as the sole basis for treatment or other patient management decisions. A negative result may occur with  improper specimen collection/handling, submission of specimen other than nasopharyngeal swab, presence of viral mutation(s) within the areas targeted by this assay, and inadequate number of viral copies(<138 copies/mL). A negative result must be combined with clinical observations, patient history, and epidemiological information. The expected result is Negative.  Fact Sheet for Patients:  BloggerCourse.com  Fact Sheet  for Healthcare Providers:  SeriousBroker.it  This test is no t yet approved or cleared by the [place] FDA and  has been authorized for detection and/or diagnosis of SARS-CoV-2 by FDA under an Emergency Use Authorization (EUA). This EUA will remain  in effect (meaning this test can be used) for the duration of the COVID-19 declaration under Section 564(b)(1) of the Act, 21 U.S.C.section 360bbb-3(b)(1), unless the authorization is terminated  or revoked sooner.       Influenza A by PCR NEGATIVE NEGATIVE Final   Influenza B by PCR NEGATIVE NEGATIVE Final    Comment: (NOTE) The Xpert Xpress SARS-CoV-2/FLU/RSV plus assay is intended as an aid in the diagnosis of influenza from Nasopharyngeal swab specimens and should not be used as a sole basis for treatment. Nasal washings and aspirates are unacceptable for Xpert Xpress SARS-CoV-2/FLU/RSV testing.  Fact Sheet for Patients: BloggerCourse.com  Fact Sheet for Healthcare Providers: SeriousBroker.it  This test is not yet approved or cleared by the [place] FDA and has been authorized for detection and/or diagnosis of SARS-CoV-2 by FDA under an Emergency Use Authorization (EUA). This EUA will remain in effect (meaning this test can be used) for the duration of the COVID-19 declaration under Section 564(b)(1) of the Act, 21 U.S.C. section 360bbb-3(b)(1), unless the authorization is terminated or revoked.  Performed at [facility], [address] [place], [place] [address]   Culture, blood (routine x 2)     Status: None (Preliminary result)   Collection Time: 10/31/20  6:43 AM   Specimen: BLOOD  Result Value Ref Range Status   Specimen Description BLOOD RIGHT ANTECUBITAL  Final   Special Requests   Final    BOTTLES DRAWN AEROBIC AND ANAEROBIC Blood Culture adequate volume   Culture   Final    NO GROWTH 4 DAYS Performed at [facility], [address], [place], [place] [address]    Report Status PENDING  Incomplete  Culture, blood (routine x 2)     Status: None (Preliminary result)   Collection Time: 10/31/20  6:51 AM   Specimen: BLOOD RIGHT HAND  Result Value Ref Range Status   Specimen Description BLOOD RIGHT HAND  Final   Special Requests   Final    BOTTLES DRAWN AEROBIC ONLY Blood Culture results may not be optimal due to an inadequate volume of blood received in culture bottles   Culture   Final    NO GROWTH  4 DAYS Performed at [facility], [address] [place], [place] [address]    Report Status PENDING  Incomplete    Patient admitted and treatment initiated for blood culture findings from 8/21 above. Patient seen by ID and began on appropriate antibiotics prior to discharge. No action needed.  ED Provider: Enzo Bi, PharmD, BCPS 11/04/2020 1:06 PM ED Clinical Pharmacist -  [phone]

## 2020-11-05 LAB — CULTURE, BLOOD (ROUTINE X 2)
Culture: NO GROWTH
Culture: NO GROWTH
Special Requests: ADEQUATE

## 2020-11-06 ENCOUNTER — Other Ambulatory Visit: Payer: Self-pay | Admitting: Internal Medicine

## 2020-11-07 ENCOUNTER — Other Ambulatory Visit: Payer: Self-pay | Admitting: Internal Medicine

## 2020-11-10 ENCOUNTER — Telehealth: Payer: Self-pay

## 2020-11-10 DIAGNOSIS — I5022 Chronic systolic (congestive) heart failure: Secondary | ICD-10-CM | POA: Diagnosis not present

## 2020-11-10 DIAGNOSIS — B9620 Unspecified Escherichia coli [E. coli] as the cause of diseases classified elsewhere: Secondary | ICD-10-CM | POA: Diagnosis not present

## 2020-11-10 DIAGNOSIS — N3000 Acute cystitis without hematuria: Secondary | ICD-10-CM | POA: Diagnosis not present

## 2020-11-10 DIAGNOSIS — B9689 Other specified bacterial agents as the cause of diseases classified elsewhere: Secondary | ICD-10-CM | POA: Diagnosis not present

## 2020-11-10 DIAGNOSIS — B964 Proteus (mirabilis) (morganii) as the cause of diseases classified elsewhere: Secondary | ICD-10-CM | POA: Diagnosis not present

## 2020-11-10 DIAGNOSIS — I130 Hypertensive heart and chronic kidney disease with heart failure and stage 1 through stage 4 chronic kidney disease, or unspecified chronic kidney disease: Secondary | ICD-10-CM | POA: Diagnosis not present

## 2020-11-10 DIAGNOSIS — R7881 Bacteremia: Secondary | ICD-10-CM | POA: Diagnosis not present

## 2020-11-10 DIAGNOSIS — B952 Enterococcus as the cause of diseases classified elsewhere: Secondary | ICD-10-CM | POA: Diagnosis not present

## 2020-11-10 DIAGNOSIS — N1832 Chronic kidney disease, stage 3b: Secondary | ICD-10-CM | POA: Diagnosis not present

## 2020-11-10 NOTE — Telephone Encounter (Signed)
PT start of care.  Requesting a verbal for 1 time a week  2 times for 2 weeks  1 time a week for 4 weeks  Patient is unsteady and needs assistance with balance.   Yvonne Bailey [phone] fine to leave a voicemail.

## 2020-11-13 ENCOUNTER — Other Ambulatory Visit: Payer: Self-pay | Admitting: Internal Medicine

## 2020-11-13 NOTE — Telephone Encounter (Signed)
Ok for verbal 

## 2020-11-14 DIAGNOSIS — R7881 Bacteremia: Secondary | ICD-10-CM | POA: Diagnosis not present

## 2020-11-14 DIAGNOSIS — N1832 Chronic kidney disease, stage 3b: Secondary | ICD-10-CM | POA: Diagnosis not present

## 2020-11-14 DIAGNOSIS — B9620 Unspecified Escherichia coli [E. coli] as the cause of diseases classified elsewhere: Secondary | ICD-10-CM | POA: Diagnosis not present

## 2020-11-14 DIAGNOSIS — I130 Hypertensive heart and chronic kidney disease with heart failure and stage 1 through stage 4 chronic kidney disease, or unspecified chronic kidney disease: Secondary | ICD-10-CM | POA: Diagnosis not present

## 2020-11-14 DIAGNOSIS — B952 Enterococcus as the cause of diseases classified elsewhere: Secondary | ICD-10-CM | POA: Diagnosis not present

## 2020-11-14 DIAGNOSIS — N3000 Acute cystitis without hematuria: Secondary | ICD-10-CM | POA: Diagnosis not present

## 2020-11-14 DIAGNOSIS — B9689 Other specified bacterial agents as the cause of diseases classified elsewhere: Secondary | ICD-10-CM | POA: Diagnosis not present

## 2020-11-14 DIAGNOSIS — I5022 Chronic systolic (congestive) heart failure: Secondary | ICD-10-CM | POA: Diagnosis not present

## 2020-11-14 DIAGNOSIS — B964 Proteus (mirabilis) (morganii) as the cause of diseases classified elsewhere: Secondary | ICD-10-CM | POA: Diagnosis not present

## 2020-11-14 NOTE — Telephone Encounter (Signed)
Verbals given to [place] today

## 2020-11-14 NOTE — Progress Notes (Signed)
Subjective:    Patient ID: Yvonne Bailey, female    DOB: Dec 16, 1940, 80 y.o.   MRN: [id number]  HPI The patient is here for follow up from the hospital  she is here with her son.     8/22 - went to ED for lower abdominal pain.  Blood cx drawn.  Had UTI and d/c'd home on oral keflex.  She was called that evening and advised positive blood cultures growing enterococcus faecalis and advised to go back to ED.    Admitted 8/23 - 8/26 for bacteremia due to enterococcus. Polymicrobial bacteremia - blood cx grew enterococcus faecalis and acinetobacter lwoffii, only 1 of 4 bottles, suspected transient bacteremia from GI source. ID consulted TEE neg for endocarditis For enterococcus - amoxicillin - 5 more days after d/c For acinetobacter - bactrim ds bid x 5 days  UTI w/ ecoli proteus mirabilis Treated with IV abx in hospital   Constipation, fecal impaction Her son feels this was related to drinking ensure Cotinue laxative  HFrEF, ischemic CMP, htn, parox afibi Amlodipine held On eliquis  CAD - stable  AKI on CKD: Improved   She is doing well.  She is eating well.  She has meals on wheels.   Her son denies confusion.  Initially when she got home she was going to the bathroom on her own and not depending on her depends, but since then she has stopped doing that and is going back to going to the bathroom in her depends.    She has no concerns.  Her son is worried about this happening again.    Medications and allergies reviewed with patient and updated if appropriate.  Patient Active Problem List   Diagnosis Date Noted   AKI (acute kidney injury) ([place]) 10/31/2020   Bacteremia due to Enterococcus 10/31/2020   Confusion 10/17/2020   Acute cystitis 10/17/2020   Decreased GFR 10/17/2020   Rash 08/29/2020   TIA (transient ischemic attack) 06/10/2020   Anxiety and depression 03/28/2020   Overactive bladder 03/28/2020   Lack of motivation 58/52/7782   Lichen simplex  chronicus 05/22/2018   Aphasia as late effect of cerebrovascular accident (CVA)    Acute ischemic cerebrovascular accident (CVA) involving left middle cerebral artery [facility])    Coronary artery disease involving native coronary artery without angina pectoris    Stage 3 chronic kidney disease ([place])    Prediabetes 07/28/2016   DOE (dyspnea on exertion) 07/09/2016   Gait instability 07/09/2016   Cardiomyopathy, ischemic    Chronic systolic heart failure ([place]) 08/21/2015   Bilateral leg edema 07/19/2015   Atrial fibrillation ([place]) 07/19/2015   Urinary urgency 03/30/2015   Lacunar infarction ([place]) 12/17/2012   Small vessel disease, cerebrovascular 12/17/2012   CAROTID BRUIT, RIGHT 08/10/2008   Peripheral vascular disease ([place]) 06/04/2007   Diverticulosis of large intestine 06/04/2007   Hypothyroidism 02/24/2007   Dyslipidemia 02/24/2007   Essential hypertension 02/24/2007   Acute thromboembolism of deep veins of lower extremity ([place]) 01/21/2007   Coronary atherosclerosis 10/29/2006    Current Outpatient Medications on File Prior to Visit  Medication Sig Dispense Refill   apixaban (ELIQUIS) 5 MG TABS tablet Take 1 tablet (5 mg total) by mouth 2 (two) times daily. 180 tablet 0   aspirin EC 81 MG tablet Take 1 tablet (81 mg total) by mouth daily. 90 tablet 3   atorvastatin (LIPITOR) 80 MG tablet TAKE 1 TABLET(80 MG) BY MOUTH DAILY (Patient taking differently: Take 80 mg by mouth  daily.) 90 tablet 1   benazepril (LOTENSIN) 20 MG tablet TAKE 1 AND 1/2 TABLETS(30 MG) BY MOUTH DAILY 135 tablet 1   brimonidine (ALPHAGAN) 0.2 % ophthalmic solution Place 1 drop into both eyes 2 (two) times daily.     dorzolamide-timolol (COSOPT) 22.3-6.8 MG/ML ophthalmic solution Place 1 drop into both eyes 2 (two) times daily.      furosemide (LASIX) 20 MG tablet Take 1 tablet (20 mg total) by mouth daily as needed for edema. 15 tablet 0   levothyroxine (SYNTHROID) 88 MCG tablet TAKE 1 TABLET(88 MCG) BY  MOUTH DAILY 90 tablet 1   metoprolol succinate (TOPROL-XL) 50 MG 24 hr tablet TAKE 1 TABLET(50 MG) BY MOUTH DAILY (Patient taking differently: Take 50 mg by mouth daily.) 90 tablet 0   MYRBETRIQ 25 MG TB24 tablet TAKE 1 TABLET(25 MG) BY MOUTH DAILY (Patient taking differently: Take 25 mg by mouth daily.) 30 tablet 5   nitroGLYCERIN (NITROSTAT) 0.4 MG SL tablet Place 1 tablet (0.4 mg total) under the tongue every 5 (five) minutes as needed for chest pain. 25 tablet 1   nystatin-triamcinolone ointment (MYCOLOG) Apply 1 application topically 2 (two) times daily. 100 g 0   polyethylene glycol (MIRALAX) 17 g packet Take one dose 3 times a day until bowel clear, maximum of 3 consecutive days. (Patient taking differently: Take 17 g by mouth daily as needed for mild constipation.) 9 each 0   sertraline (ZOLOFT) 50 MG tablet TAKE 1 TABLET(50 MG) BY MOUTH DAILY 30 tablet 5   spironolactone (ALDACTONE) 25 MG tablet TAKE 1 TABLET(25 MG) BY MOUTH DAILY 90 tablet 1   travoprost, benzalkonium, (TRAVATAN) 0.004 % ophthalmic solution Place 1 drop into both eyes at bedtime.     No current facility-administered medications on file prior to visit.    Past Medical History:  Diagnosis Date   Bilateral leg edema 07/19/2015   CAD (coronary artery disease)    a. anterior MI s/p PCI in 1992. b. cath 08/2015 with severe three-vessel CAD turned down for CABG and underwent DESx5 to prox Cx/OM2/D1/oRCA/mRCA   Carotid artery disease ([place])    a. carotid duplex 03/2015 showed 1-39% BICA, normal subclavian arteries, chronically occluded left vertebral, f/u recommended only PRN.   DVT (deep venous thrombosis) ([place])    X1   History of nuclear stress test    a. Myoview 6/17: EF 20-25%, mid anteroseptal, apical anterior, apical septal, apical inferior, apical lateral and apical scar, no ischemia, intermediate risk   HTN (hypertension)    Hyperlipidemia    Hypokalemia    Hypothyroidism    Ischemic cardiomyopathy    a. Echo  6/17: EF 20-25%, apex appears akinetic, MAC, moderate MR, moderate LAE, mild RVE, trivial PI, PASP 47 mmHg (needs repeat with Definity contrast).  b. Limited echo with Definity contrast 7/17: EF 25-30%, moderate to severe LAE. c. Limited Echo 2018 showed EF 40-45%.   Lacunar infarction ([place]) 12/17/2012   Dr Krista Blue, Neurology    Leukocytosis    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   Longstanding persistent atrial fibrillation ([place])    MI (myocardial infarction) ([place]) 1992   PAD (peripheral artery disease) ([facility])    Right SFA occlusion, severe disease left CFA and SFA   Prediabetes 07/28/2016   Stage 3 chronic kidney disease ([place])    Stroke ([place])    a. 02/2017 in setting of noncompliance with Eliquis   TIA (transient ischemic attack)    Tricuspid regurgitation     Past Surgical History:  Procedure Laterality Date   APPENDECTOMY     at hysterectomy and USO for fibroids, Dr. Ysidro Evert   CARDIAC CATHETERIZATION  1992   Dr Eustace Quail   CARDIAC CATHETERIZATION N/A 09/06/2015   Procedure: Right/Left Heart Cath and Coronary Angiography;  Surgeon: Burnell Blanks, MD;  Location: [facility];  Service: Cardiovascular;  Laterality: N/A;   CARDIAC CATHETERIZATION N/A 09/07/2015   Procedure: Coronary Stent Intervention;  Surgeon: Burnell Blanks, MD;  Location: [facility];  Service: Cardiovascular;  Laterality: N/A;   COLONOSCOPY     negative; 2008, Dr. Delfin Edis   fracture LLE     '94; pinned   IR ANGIO INTRA EXTRACRAN SEL COM CAROTID INNOMINATE BILAT MOD SED  03/02/2017   IR ANGIO INTRA EXTRACRAN SEL COM CAROTID INNOMINATE BILAT MOD SED  04/23/2018   IR ANGIO VERTEBRAL SEL SUBCLAVIAN INNOMINATE BILAT MOD SED  04/23/2018   IR ANGIO VERTEBRAL SEL VERTEBRAL UNI R MOD SED  03/02/2017   IR RADIOLOGIST EVAL & MGMT  04/28/2017   IR US GUIDE VASC ACCESS RIGHT  04/23/2018   RADIOLOGY WITH ANESTHESIA N/A 03/02/2017   Procedure: RADIOLOGY WITH ANESTHESIA;  Surgeon: Luanne Bras, MD;  Location: [facility];  Service: Radiology;  Laterality: N/A;   TEE WITHOUT CARDIOVERSION N/A 11/02/2020   Procedure: TRANSESOPHAGEAL ECHOCARDIOGRAM (TEE);  Surgeon: Sanda Klein, MD;  Location: [facility];  Service: Cardiovascular;  Laterality: N/A;   TONSILLECTOMY     TOTAL ABDOMINAL HYSTERECTOMY     & BSO for fibroids    Social History   Socioeconomic History   Marital status: Married    Spouse name: [name]   Number of children: 1   Years of education: college   Highest education level: Not on file  Occupational History   Occupation: Pharmacist, hospital    Comment: Retired  Tobacco Use   Smoking status: Former    Types: Cigarettes    Quit date: 03/11/1990    Years since quitting: 30.7   Smokeless tobacco: Never   Tobacco comments:    smoked 1973- 1992, up to < 5 cigarettes  Vaping Use   Vaping Use: Never used  Substance and Sexual Activity   Alcohol use: No   Drug use: No   Sexual activity: Not Currently    Birth control/protection: Surgical  Other Topics Concern   Not on file  Social History Narrative   She works as needed Oceanographer, 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 08/31/09- designated party form signed appointing, husband Karah Caruthers; ok to leave msg on home phone [phone].      Caffeine Small Amount one cup very rare.   Right handed.   One child- [name]   Social Determinants of Health   Financial Resource Strain: Not on file  Food Insecurity: Not on file  Transportation Needs: Not on file  Physical Activity: Not on file  Stress: Not on file  Social Connections: Not on file    Family History  Problem Relation Age of Onset   Diabetes Mother    Hypertension Mother    Stroke Brother        ?> 1   Coronary artery disease Brother        stent in 63s   Cancer Neg Hx     Review of Systems  Constitutional:  Negative for appetite change, chills and fever.  Respiratory:  Negative for cough, shortness of breath and wheezing.    Cardiovascular:  Negative for chest pain, palpitations and  leg swelling.  Gastrointestinal:  Negative for abdominal pain, constipation, diarrhea and nausea.       No gerd  Genitourinary:  Negative for dysuria, frequency and hematuria.       Urine odor  Neurological:  Negative for light-headedness and headaches.  Psychiatric/Behavioral:  Negative for confusion.       Objective:   Vitals:   11/15/20 1356  BP: 128/68  Pulse: 65  Temp: 98.5 F (36.9 C)  SpO2: 98%   BP Readings from Last 3 Encounters:  11/15/20 128/68  11/03/20 (!) 119/57  10/30/20 121/77   Wt Readings from Last 3 Encounters:  11/15/20 163 lb (73.9 kg)  11/02/20 163 lb 2.3 oz (74 kg)  10/29/20 163 lb (73.9 kg)   Body mass index is 25.53 kg/m.   Physical Exam    Constitutional: Appears well-developed and well-nourished. No distress.  HENT:  Head: Normocephalic and atraumatic.  Neck: Neck supple. No tracheal deviation present. No thyromegaly present.  No cervical lymphadenopathy Cardiovascular: Normal rate, regular rhythm and normal heart sounds.  2/6 sys murmur.  Mild b/l LE edema. Pulmonary/Chest: Effort normal and breath sounds normal. No respiratory distress. No has no wheezes. No rales.  Abdomen: soft, NT, ND Skin: Skin is warm and dry. Not diaphoretic.  Psychiatric: Normal mood and affect. Behavior is normal.    Lab Results  Component Value Date   WBC 8.3 11/15/2020   HGB 10.7 (L) 11/15/2020   HCT 32.7 (L) 11/15/2020   PLT 240.0 11/15/2020   GLUCOSE 87 11/15/2020   CHOL 117 03/28/2020   TRIG 60.0 03/28/2020   HDL 39.30 03/28/2020   LDLCALC 65 03/28/2020   ALT 18 11/15/2020   AST 19 11/15/2020   NA 141 11/15/2020   K 4.5 11/15/2020   CL 109 11/15/2020   CREATININE 0.98 11/15/2020   BUN 22 11/15/2020   CO2 24 11/15/2020   TSH 0.86 03/28/2020   INR 1.6 (H) 09/08/2020   HGBA1C 5.4 03/28/2020    ECHO TEE    TRANSESOPHOGEAL ECHO REPORT       Patient Name:   Yvonne Bailey Date  of Exam: 11/02/2020 Medical Rec #:  [id number]           Height:       67.0 in Accession #:    [id number]          Weight:       163.1 lb Date of Birth:  [birth year]            BSA:          1.855 m Patient Age:    85 years            BP:           155/78 mmHg Patient Gender: F                   HR:           73 bpm. Exam Location:  Inpatient  Procedure: Transesophageal Echo, Color Doppler and Cardiac Doppler  Indications:     Bacteremia   History:         Patient has prior history of Echocardiogram examinations, most                  recent 03/03/2017. CAD, Arrythmias:Atrial Fibrillation; Risk                  Factors:Hypertension and Dyslipidemia.   Sonographer:     Raquel Sarna Senior  RDCS Referring Phys:  [id number] Darreld Mclean Diagnosing Phys: Sanda Klein MD  PROCEDURE: After discussion of the risks and benefits of a TEE, an informed consent was obtained from the patient. The transesophogeal probe was passed without difficulty through the esophogus of the patient. Local oropharyngeal anesthetic was provided  with Cetacaine. Sedation performed by different physician. The patient was monitored while under deep sedation. Anesthestetic sedation was provided intravenously by Anesthesiology: 14m of Propofol, 444mof Lidocaine. The patient developed no  complications during the procedure.  IMPRESSIONS   1. Some calcification is seen in the apical left ventricular myocardium, consistent with scar. Left ventricular ejection fraction, by estimation, is 35 to 40%. The left ventricle has moderately decreased function. The left ventricle demonstrates  regional wall motion abnormalities (see scoring diagram/findings for description). Left ventricular diastolic function could not be evaluated. There is severe hypokinesis of the left ventricular, mid-apical anteroseptal wall, anterior wall and apical  segment. There is severe hypokinesis of the left ventricular, mid-apical inferior wall.  2. Right  ventricular systolic function is mildly reduced. The right ventricular size is normal. There is normal pulmonary artery systolic pressure.  3. Left atrial size was moderately dilated. No left atrial/left atrial appendage thrombus was detected.  4. Right atrial size was moderately dilated.  5. The mitral valve is normal in structure. Trivial mitral valve regurgitation.  6. The aortic valve is tricuspid. Aortic valve regurgitation is mild. Mild aortic valve sclerosis is present, with no evidence of aortic valve stenosis.  7. There is Moderate (Grade III) layered plaque involving the descending aorta and transverse aorta.  FINDINGS  Left Ventricle: Some calcification is seen in the apical left ventricular myocardium, consistent with scar. Left ventricular ejection fraction, by estimation, is 35 to 40%. The left ventricle has moderately decreased function. The left ventricle  demonstrates regional wall motion abnormalities. Severe hypokinesis of the left ventricular, mid-apical anteroseptal wall, anterior wall and apical segment. Severe hypokinesis of the left ventricular, mid-apical inferior wall. The left ventricular  internal cavity size was normal in size. There is no left ventricular hypertrophy. Left ventricular diastolic function could not be evaluated due to atrial fibrillation. Left ventricular diastolic function could not be evaluated.    LV Wall Scoring: The apex is akinetic. The mid and distal anterior wall, mid and distal anterior septum, and mid and distal inferior wall are hypokinetic.  Right Ventricle: The right ventricular size is normal. No increase in right ventricular wall thickness. Right ventricular systolic function is mildly reduced. There is normal pulmonary artery systolic pressure. The tricuspid regurgitant velocity is 1.90  m/s, and with an assumed right atrial pressure of 5 mmHg, the estimated right ventricular systolic pressure is 1994.8mHg.  Left Atrium: Left atrial  size was moderately dilated. Spontaneous echo contrast was present in the left atrial appendage and left atrium. No left atrial/left atrial appendage thrombus was detected.  Right Atrium: Right atrial size was moderately dilated.  Pericardium: There is no evidence of pericardial effusion.  Mitral Valve: The mitral valve is normal in structure. Trivial mitral valve regurgitation.  Tricuspid Valve: The tricuspid valve is normal in structure. Tricuspid valve regurgitation is mild.  Aortic Valve: The aortic valve is tricuspid. Aortic valve regurgitation is mild. Mild aortic valve sclerosis is present, with no evidence of aortic valve stenosis.  Pulmonic Valve: The pulmonic valve was normal in structure. Pulmonic valve regurgitation is trivial.  Aorta: The aortic root, ascending aorta, aortic arch and descending aorta are all structurally normal,  with no evidence of dilitation or obstruction. There is moderate (Grade III) layered plaque involving the descending aorta and transverse aorta.  IAS/Shunts: No atrial level shunt detected by color flow Doppler.    TRICUSPID VALVE TR Peak grad:   14.4 mmHg TR Vmax:        190.00 cm/s  Sanda Klein MD Electronically signed by Sanda Klein MD Signature Date/Time: 11/02/2020/10:06:46 AM      Final     Assessment & Plan:    See Problem List for Assessment and Plan of chronic medical problems.    This visit occurred during the SARS-CoV-2 public health emergency.  Safety protocols were in place, including screening questions prior to the visit, additional usage of staff PPE, and extensive cleaning of exam room while observing appropriate contact time as indicated for disinfecting solutions.

## 2020-11-15 ENCOUNTER — Other Ambulatory Visit: Payer: Self-pay

## 2020-11-15 ENCOUNTER — Ambulatory Visit (INDEPENDENT_AMBULATORY_CARE_PROVIDER_SITE_OTHER): Payer: Medicare HMO | Admitting: Internal Medicine

## 2020-11-15 ENCOUNTER — Encounter: Payer: Self-pay | Admitting: Internal Medicine

## 2020-11-15 VITALS — BP 128/68 | HR 65 | Temp 98.5°F | Ht 67.0 in | Wt 163.0 lb

## 2020-11-15 DIAGNOSIS — R7881 Bacteremia: Secondary | ICD-10-CM | POA: Diagnosis not present

## 2020-11-15 DIAGNOSIS — N1832 Chronic kidney disease, stage 3b: Secondary | ICD-10-CM | POA: Diagnosis not present

## 2020-11-15 DIAGNOSIS — F419 Anxiety disorder, unspecified: Secondary | ICD-10-CM | POA: Diagnosis not present

## 2020-11-15 DIAGNOSIS — B952 Enterococcus as the cause of diseases classified elsewhere: Secondary | ICD-10-CM | POA: Diagnosis not present

## 2020-11-15 DIAGNOSIS — I10 Essential (primary) hypertension: Secondary | ICD-10-CM

## 2020-11-15 DIAGNOSIS — I4819 Other persistent atrial fibrillation: Secondary | ICD-10-CM

## 2020-11-15 DIAGNOSIS — B964 Proteus (mirabilis) (morganii) as the cause of diseases classified elsewhere: Secondary | ICD-10-CM | POA: Diagnosis not present

## 2020-11-15 DIAGNOSIS — N179 Acute kidney failure, unspecified: Secondary | ICD-10-CM

## 2020-11-15 DIAGNOSIS — N3000 Acute cystitis without hematuria: Secondary | ICD-10-CM | POA: Diagnosis not present

## 2020-11-15 DIAGNOSIS — I5022 Chronic systolic (congestive) heart failure: Secondary | ICD-10-CM

## 2020-11-15 DIAGNOSIS — N3001 Acute cystitis with hematuria: Secondary | ICD-10-CM

## 2020-11-15 DIAGNOSIS — R600 Localized edema: Secondary | ICD-10-CM

## 2020-11-15 DIAGNOSIS — F32A Depression, unspecified: Secondary | ICD-10-CM

## 2020-11-15 DIAGNOSIS — B9620 Unspecified Escherichia coli [E. coli] as the cause of diseases classified elsewhere: Secondary | ICD-10-CM | POA: Diagnosis not present

## 2020-11-15 DIAGNOSIS — B9689 Other specified bacterial agents as the cause of diseases classified elsewhere: Secondary | ICD-10-CM | POA: Diagnosis not present

## 2020-11-15 DIAGNOSIS — I130 Hypertensive heart and chronic kidney disease with heart failure and stage 1 through stage 4 chronic kidney disease, or unspecified chronic kidney disease: Secondary | ICD-10-CM | POA: Diagnosis not present

## 2020-11-15 LAB — COMPREHENSIVE METABOLIC PANEL
ALT: 18 U/L (ref 0–35)
AST: 19 U/L (ref 0–37)
Albumin: 3.6 g/dL (ref 3.5–5.2)
Alkaline Phosphatase: 77 U/L (ref 39–117)
BUN: 22 mg/dL (ref 6–23)
CO2: 24 mEq/L (ref 19–32)
Calcium: 9.2 mg/dL (ref 8.4–10.5)
Chloride: 109 mEq/L (ref 96–112)
Creatinine, Ser: 0.98 mg/dL (ref 0.40–1.20)
GFR: 54.57 mL/min — ABNORMAL LOW (ref >60.00)
Glucose, Bld: 87 mg/dL (ref 70–99)
Potassium: 4.5 mEq/L (ref 3.5–5.1)
Sodium: 141 mEq/L (ref 135–145)
Total Bilirubin: 0.6 mg/dL (ref 0.2–1.2)
Total Protein: 6.6 g/dL (ref 6.0–8.3)

## 2020-11-15 LAB — CBC WITH DIFFERENTIAL/PLATELET
Basophils Absolute: 0.1 10*3/uL (ref 0.0–0.1)
Basophils Relative: 0.8 % (ref 0.0–3.0)
Eosinophils Absolute: 0.2 10*3/uL (ref 0.0–0.7)
Eosinophils Relative: 2.4 % (ref 0.0–5.0)
HCT: 32.7 % — ABNORMAL LOW (ref 36.0–46.0)
Hemoglobin: 10.7 g/dL — ABNORMAL LOW (ref 12.0–15.0)
Lymphocytes Relative: 21.9 % (ref 12.0–46.0)
Lymphs Abs: 1.8 10*3/uL (ref 0.7–4.0)
MCHC: 32.6 g/dL (ref 30.0–36.0)
MCV: 100.5 fl — ABNORMAL HIGH (ref 78.0–100.0)
Monocytes Absolute: 0.7 10*3/uL (ref 0.1–1.0)
Monocytes Relative: 8.9 % (ref 3.0–12.0)
Neutro Abs: 5.5 10*3/uL (ref 1.4–7.7)
Neutrophils Relative %: 66 % (ref 43.0–77.0)
Platelets: 240 10*3/uL (ref 150.0–400.0)
RBC: 3.26 Mil/uL — ABNORMAL LOW (ref 3.87–5.11)
RDW: 14.9 % (ref 11.5–15.5)
WBC: 8.3 10*3/uL (ref 4.0–10.5)

## 2020-11-15 NOTE — Assessment & Plan Note (Signed)
Resolved Completed abx in hospital Asymptomatic Discussed importance of not using depends all the time - needs to get up and go to the bathroom

## 2020-11-15 NOTE — Assessment & Plan Note (Signed)
Chronic BP well controlled Continue benazepril 20 mg qd, metoprolol xl 50 mg qd, aldactone 25 mg qd cmp

## 2020-11-15 NOTE — Assessment & Plan Note (Signed)
Chronic Controlled, stable Continue zoloft 50 mg qd

## 2020-11-15 NOTE — Assessment & Plan Note (Signed)
Resolved Completed home abx No symptoms c/w infection/sepsis Cbc, cmp  Discussed prevention

## 2020-11-15 NOTE — Assessment & Plan Note (Signed)
Resolved while in hospital

## 2020-11-15 NOTE — Assessment & Plan Note (Signed)
Chronic Has 1+ edema in b/l LE Continue lasix 20 mg qd prn

## 2020-11-15 NOTE — Patient Instructions (Addendum)
    Blood work was ordered.      Medications changes include :   none     Please followup in 6 months  

## 2020-11-15 NOTE — Assessment & Plan Note (Signed)
Chronic Has some edema in b/l LE but appears euvolemic Lasix changed to daily prn, but I do worry about her / her son knowing when to take this Monitor leg edema, SOB Continue lasix 20 mg qd prn

## 2020-11-21 DIAGNOSIS — B9620 Unspecified Escherichia coli [E. coli] as the cause of diseases classified elsewhere: Secondary | ICD-10-CM | POA: Diagnosis not present

## 2020-11-21 DIAGNOSIS — I130 Hypertensive heart and chronic kidney disease with heart failure and stage 1 through stage 4 chronic kidney disease, or unspecified chronic kidney disease: Secondary | ICD-10-CM | POA: Diagnosis not present

## 2020-11-21 DIAGNOSIS — B964 Proteus (mirabilis) (morganii) as the cause of diseases classified elsewhere: Secondary | ICD-10-CM | POA: Diagnosis not present

## 2020-11-21 DIAGNOSIS — N3000 Acute cystitis without hematuria: Secondary | ICD-10-CM | POA: Diagnosis not present

## 2020-11-21 DIAGNOSIS — N1832 Chronic kidney disease, stage 3b: Secondary | ICD-10-CM | POA: Diagnosis not present

## 2020-11-21 DIAGNOSIS — B9689 Other specified bacterial agents as the cause of diseases classified elsewhere: Secondary | ICD-10-CM | POA: Diagnosis not present

## 2020-11-21 DIAGNOSIS — B952 Enterococcus as the cause of diseases classified elsewhere: Secondary | ICD-10-CM | POA: Diagnosis not present

## 2020-11-21 DIAGNOSIS — R7881 Bacteremia: Secondary | ICD-10-CM | POA: Diagnosis not present

## 2020-11-21 DIAGNOSIS — I5022 Chronic systolic (congestive) heart failure: Secondary | ICD-10-CM | POA: Diagnosis not present

## 2020-11-23 DIAGNOSIS — Z951 Presence of aortocoronary bypass graft: Secondary | ICD-10-CM

## 2020-11-23 DIAGNOSIS — B9689 Other specified bacterial agents as the cause of diseases classified elsewhere: Secondary | ICD-10-CM

## 2020-11-23 DIAGNOSIS — I2510 Atherosclerotic heart disease of native coronary artery without angina pectoris: Secondary | ICD-10-CM

## 2020-11-23 DIAGNOSIS — Z955 Presence of coronary angioplasty implant and graft: Secondary | ICD-10-CM

## 2020-11-23 DIAGNOSIS — F0390 Unspecified dementia without behavioral disturbance: Secondary | ICD-10-CM

## 2020-11-23 DIAGNOSIS — K5900 Constipation, unspecified: Secondary | ICD-10-CM

## 2020-11-23 DIAGNOSIS — I130 Hypertensive heart and chronic kidney disease with heart failure and stage 1 through stage 4 chronic kidney disease, or unspecified chronic kidney disease: Secondary | ICD-10-CM | POA: Diagnosis not present

## 2020-11-23 DIAGNOSIS — Z7901 Long term (current) use of anticoagulants: Secondary | ICD-10-CM

## 2020-11-23 DIAGNOSIS — I480 Paroxysmal atrial fibrillation: Secondary | ICD-10-CM

## 2020-11-23 DIAGNOSIS — Z7982 Long term (current) use of aspirin: Secondary | ICD-10-CM

## 2020-11-23 DIAGNOSIS — I255 Ischemic cardiomyopathy: Secondary | ICD-10-CM

## 2020-11-23 DIAGNOSIS — I5022 Chronic systolic (congestive) heart failure: Secondary | ICD-10-CM

## 2020-11-23 DIAGNOSIS — N3000 Acute cystitis without hematuria: Secondary | ICD-10-CM | POA: Diagnosis not present

## 2020-11-23 DIAGNOSIS — I714 Abdominal aortic aneurysm, without rupture: Secondary | ICD-10-CM

## 2020-11-23 DIAGNOSIS — B964 Proteus (mirabilis) (morganii) as the cause of diseases classified elsewhere: Secondary | ICD-10-CM | POA: Diagnosis not present

## 2020-11-23 DIAGNOSIS — R7881 Bacteremia: Secondary | ICD-10-CM | POA: Diagnosis not present

## 2020-11-23 DIAGNOSIS — N1832 Chronic kidney disease, stage 3b: Secondary | ICD-10-CM

## 2020-11-23 DIAGNOSIS — B952 Enterococcus as the cause of diseases classified elsewhere: Secondary | ICD-10-CM

## 2020-11-23 DIAGNOSIS — B9620 Unspecified Escherichia coli [E. coli] as the cause of diseases classified elsewhere: Secondary | ICD-10-CM | POA: Diagnosis not present

## 2020-11-28 DIAGNOSIS — N3000 Acute cystitis without hematuria: Secondary | ICD-10-CM | POA: Diagnosis not present

## 2020-11-28 DIAGNOSIS — B9689 Other specified bacterial agents as the cause of diseases classified elsewhere: Secondary | ICD-10-CM | POA: Diagnosis not present

## 2020-11-28 DIAGNOSIS — B952 Enterococcus as the cause of diseases classified elsewhere: Secondary | ICD-10-CM | POA: Diagnosis not present

## 2020-11-28 DIAGNOSIS — N1832 Chronic kidney disease, stage 3b: Secondary | ICD-10-CM | POA: Diagnosis not present

## 2020-11-28 DIAGNOSIS — B9620 Unspecified Escherichia coli [E. coli] as the cause of diseases classified elsewhere: Secondary | ICD-10-CM | POA: Diagnosis not present

## 2020-11-28 DIAGNOSIS — I5022 Chronic systolic (congestive) heart failure: Secondary | ICD-10-CM | POA: Diagnosis not present

## 2020-11-28 DIAGNOSIS — I130 Hypertensive heart and chronic kidney disease with heart failure and stage 1 through stage 4 chronic kidney disease, or unspecified chronic kidney disease: Secondary | ICD-10-CM | POA: Diagnosis not present

## 2020-11-28 DIAGNOSIS — B964 Proteus (mirabilis) (morganii) as the cause of diseases classified elsewhere: Secondary | ICD-10-CM | POA: Diagnosis not present

## 2020-11-28 DIAGNOSIS — R7881 Bacteremia: Secondary | ICD-10-CM | POA: Diagnosis not present

## 2020-11-30 ENCOUNTER — Encounter: Payer: Self-pay | Admitting: Nurse Practitioner

## 2020-11-30 ENCOUNTER — Ambulatory Visit (INDEPENDENT_AMBULATORY_CARE_PROVIDER_SITE_OTHER): Payer: Medicare HMO | Admitting: Nurse Practitioner

## 2020-11-30 ENCOUNTER — Ambulatory Visit: Payer: Medicare HMO | Admitting: Nurse Practitioner

## 2020-11-30 ENCOUNTER — Other Ambulatory Visit: Payer: Self-pay

## 2020-11-30 ENCOUNTER — Other Ambulatory Visit: Payer: Medicare HMO

## 2020-11-30 VITALS — BP 122/82 | HR 63 | Temp 97.7°F

## 2020-11-30 DIAGNOSIS — R410 Disorientation, unspecified: Secondary | ICD-10-CM

## 2020-11-30 DIAGNOSIS — B964 Proteus (mirabilis) (morganii) as the cause of diseases classified elsewhere: Secondary | ICD-10-CM | POA: Diagnosis not present

## 2020-11-30 DIAGNOSIS — I130 Hypertensive heart and chronic kidney disease with heart failure and stage 1 through stage 4 chronic kidney disease, or unspecified chronic kidney disease: Secondary | ICD-10-CM | POA: Diagnosis not present

## 2020-11-30 DIAGNOSIS — R944 Abnormal results of kidney function studies: Secondary | ICD-10-CM | POA: Diagnosis not present

## 2020-11-30 DIAGNOSIS — I5022 Chronic systolic (congestive) heart failure: Secondary | ICD-10-CM | POA: Diagnosis not present

## 2020-11-30 DIAGNOSIS — R7881 Bacteremia: Secondary | ICD-10-CM | POA: Diagnosis not present

## 2020-11-30 DIAGNOSIS — Z87440 Personal history of urinary (tract) infections: Secondary | ICD-10-CM | POA: Diagnosis not present

## 2020-11-30 DIAGNOSIS — B952 Enterococcus as the cause of diseases classified elsewhere: Secondary | ICD-10-CM | POA: Diagnosis not present

## 2020-11-30 DIAGNOSIS — B9620 Unspecified Escherichia coli [E. coli] as the cause of diseases classified elsewhere: Secondary | ICD-10-CM | POA: Diagnosis not present

## 2020-11-30 DIAGNOSIS — B9689 Other specified bacterial agents as the cause of diseases classified elsewhere: Secondary | ICD-10-CM | POA: Diagnosis not present

## 2020-11-30 DIAGNOSIS — N3000 Acute cystitis without hematuria: Secondary | ICD-10-CM | POA: Diagnosis not present

## 2020-11-30 DIAGNOSIS — N1832 Chronic kidney disease, stage 3b: Secondary | ICD-10-CM | POA: Diagnosis not present

## 2020-11-30 LAB — URINALYSIS WITH CULTURE, IF INDICATED
Bilirubin Urine: NEGATIVE
Ketones, ur: NEGATIVE
Nitrite: NEGATIVE
Specific Gravity, Urine: 1.025 (ref 1.000–1.030)
Urine Glucose: NEGATIVE
Urobilinogen, UA: 0.2 (ref 0.0–1.0)
pH: 5.5 (ref 5.0–8.0)

## 2020-11-30 LAB — CBC WITH DIFFERENTIAL/PLATELET
Basophils Absolute: 0.1 10*3/uL (ref 0.0–0.1)
Basophils Relative: 1.4 % (ref 0.0–3.0)
Eosinophils Absolute: 0.2 10*3/uL (ref 0.0–0.7)
Eosinophils Relative: 2.3 % (ref 0.0–5.0)
HCT: 35.1 % — ABNORMAL LOW (ref 36.0–46.0)
Hemoglobin: 11.5 g/dL — ABNORMAL LOW (ref 12.0–15.0)
Lymphocytes Relative: 26.8 % (ref 12.0–46.0)
Lymphs Abs: 2.4 10*3/uL (ref 0.7–4.0)
MCHC: 32.8 g/dL (ref 30.0–36.0)
MCV: 99.5 fl (ref 78.0–100.0)
Monocytes Absolute: 0.9 10*3/uL (ref 0.1–1.0)
Monocytes Relative: 10.1 % (ref 3.0–12.0)
Neutro Abs: 5.3 10*3/uL (ref 1.4–7.7)
Neutrophils Relative %: 59.4 % (ref 43.0–77.0)
Platelets: 209 10*3/uL (ref 150.0–400.0)
RBC: 3.53 Mil/uL — ABNORMAL LOW (ref 3.87–5.11)
RDW: 14.7 % (ref 11.5–15.5)
WBC: 8.9 10*3/uL (ref 4.0–10.5)

## 2020-11-30 LAB — COMPREHENSIVE METABOLIC PANEL
ALT: 15 U/L (ref 0–35)
AST: 21 U/L (ref 0–37)
Albumin: 4.1 g/dL (ref 3.5–5.2)
Alkaline Phosphatase: 82 U/L (ref 39–117)
BUN: 21 mg/dL (ref 6–23)
CO2: 27 mEq/L (ref 19–32)
Calcium: 9.5 mg/dL (ref 8.4–10.5)
Chloride: 107 mEq/L (ref 96–112)
Creatinine, Ser: 1.1 mg/dL (ref 0.40–1.20)
GFR: 47.5 mL/min — ABNORMAL LOW (ref >60.00)
Glucose, Bld: 79 mg/dL (ref 70–99)
Potassium: 4.3 mEq/L (ref 3.5–5.1)
Sodium: 141 mEq/L (ref 135–145)
Total Bilirubin: 0.6 mg/dL (ref 0.2–1.2)
Total Protein: 6.9 g/dL (ref 6.0–8.3)

## 2020-11-30 LAB — SEDIMENTATION RATE: Sed Rate: 23 mm/hr (ref 0–30)

## 2020-11-30 MED ORDER — SULFAMETHOXAZOLE-TRIMETHOPRIM 800-160 MG PO TABS: 1.0000 | ORAL_TABLET | Freq: Two times a day (BID) | ORAL | 0 refills | Status: DC

## 2020-11-30 NOTE — Progress Notes (Signed)
Subjective:  Patient ID: Yvonne Bailey, female    DOB: Sep 23, 1940  Age: 80 y.o. MRN: [id number]  CC:  Chief Complaint  Patient presents with   Urinary Tract Infection    Son states mom was in the hosp back in July for UTI.Marland Kitchen since release she has had another UTI. He states she was confuse on yesterday, and from last time the MD stated sometimes whn older pt have confusion it could be UTI. Urine has a foul odor      HPI  This patient arrives today for the above.  She is companied by her son.  They tell me that she has been a bit more confused since yesterday and has had strong smelling urine.  She was hospitalized late last month secondary to bacteremia which they believe was from urinary tract infection.  She was try to use the bathroom on a regular basis for a while after her hospitalization, but has since started just urinating in her depends and not change her depends in a regular basis.  Son believes the patient has had a UTI every month for the last 3 months.  Past Medical History:  Diagnosis Date   Bilateral leg edema 07/19/2015   CAD (coronary artery disease)    a. anterior MI s/p PCI in 1992. b. cath 08/2015 with severe three-vessel CAD turned down for CABG and underwent DESx5 to prox Cx/OM2/D1/oRCA/mRCA   Carotid artery disease ([place])    a. carotid duplex 03/2015 showed 1-39% BICA, normal subclavian arteries, chronically occluded left vertebral, f/u recommended only PRN.   DVT (deep venous thrombosis) ([place])    X1   History of nuclear stress test    a. Myoview 6/17: EF 20-25%, mid anteroseptal, apical anterior, apical septal, apical inferior, apical lateral and apical scar, no ischemia, intermediate risk   HTN (hypertension)    Hyperlipidemia    Hypokalemia    Hypothyroidism    Ischemic cardiomyopathy    a. Echo 6/17: EF 20-25%, apex appears akinetic, MAC, moderate MR, moderate LAE, mild RVE, trivial PI, PASP 47 mmHg (needs repeat with Definity contrast).  b. Limited  echo with Definity contrast 7/17: EF 25-30%, moderate to severe LAE. c. Limited Echo 2018 showed EF 40-45%.   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 08/31/09- designated party form signed appointing, husband [name]; ok to leave msg on home phone [phone].      Caffeine Small Amount one cup very rare.   Right handed.   One child- Jenny Reichmann  Stann Mainland   Social History   Tobacco Use   Smoking status: Former    Types: Cigarettes    Quit date: 03/11/1990    Years since quitting: 30.7   Smokeless tobacco: Never   Tobacco comments:    smoked 1973- 1992, up to < 5 cigarettes  Substance Use Topics   Alcohol use: No     Current Meds  Medication Sig   apixaban (ELIQUIS) 5 MG TABS tablet Take 1 tablet (5 mg total) by mouth 2 (two) times daily.   aspirin EC 81 MG tablet Take 1 tablet (81 mg total) by mouth daily.   atorvastatin (LIPITOR) 80 MG tablet TAKE 1 TABLET(80 MG) BY MOUTH DAILY (Patient taking differently: Take 80 mg by mouth daily.)   benazepril (LOTENSIN) 20 MG tablet TAKE 1 AND 1/2 TABLETS(30 MG) BY MOUTH DAILY   brimonidine (ALPHAGAN) 0.2 % ophthalmic solution Place 1 drop into both eyes 2 (two) times  daily.   dorzolamide-timolol (COSOPT) 22.3-6.8 MG/ML ophthalmic solution Place 1 drop into both eyes 2 (two) times daily.    furosemide (LASIX) 20 MG tablet Take 1 tablet (20 mg total) by mouth daily as needed for edema.   levothyroxine (SYNTHROID) 88 MCG tablet TAKE 1 TABLET(88 MCG) BY MOUTH DAILY   metoprolol succinate (TOPROL-XL) 50 MG 24 hr tablet TAKE 1 TABLET(50 MG) BY MOUTH DAILY (Patient taking differently: Take 50 mg by mouth daily.)   MYRBETRIQ 25 MG TB24 tablet TAKE 1 TABLET(25 MG) BY MOUTH DAILY (Patient taking differently: Take 25 mg by mouth daily.)   nitroGLYCERIN (NITROSTAT) 0.4 MG SL tablet Place 1 tablet (0.4 mg total) under the tongue every 5 (five) minutes as needed for chest pain.   nystatin-triamcinolone ointment (MYCOLOG) Apply 1 application topically 2 (two) times daily.   polyethylene glycol (MIRALAX) 17 g packet Take one dose 3 times a day until bowel clear, maximum of 3 consecutive days. (Patient taking differently: Take 17 g by mouth daily as needed for mild constipation.)   sertraline (ZOLOFT) 50 MG tablet TAKE 1 TABLET(50 MG) BY MOUTH DAILY   spironolactone (ALDACTONE) 25 MG tablet TAKE 1 TABLET(25 MG) BY MOUTH DAILY   travoprost, benzalkonium, (TRAVATAN) 0.004 % ophthalmic solution Place 1 drop into both eyes at bedtime.    ROS:  Review of Systems  Constitutional:  Negative for chills and fever.  Cardiovascular:  Negative for chest pain.  Gastrointestinal:  Negative for abdominal pain, diarrhea, nausea and vomiting.  Genitourinary:  Negative for dysuria.       (+) foul smelling urine    Objective:   Today's Vitals: BP 122/82 (BP Location: Left Arm)   Pulse 63   Temp 97.7 F (36.5 C) (Oral)   SpO2 99%  Vitals with BMI 11/30/2020 11/15/2020 11/03/2020  Height - _0  -  Weight - 163 lbs -  BMI - 50.27 -  Systolic [phone]  Diastolic 82 68 57  Pulse 63 65 78     Physical Exam Vitals reviewed.  Constitutional:      General: She is not in acute  distress.    Appearance: Normal appearance.  HENT:     Head: Normocephalic and atraumatic.  Neck:     Vascular: No carotid bruit.  Cardiovascular:     Rate and Rhythm: Normal rate and regular rhythm.     Pulses: Normal pulses.     Heart sounds: Normal heart sounds.  Pulmonary:     Effort: Pulmonary effort is normal.  Breath sounds: Normal breath sounds.  Abdominal:     Tenderness: There is no right CVA tenderness or left CVA tenderness.  Skin:    General: Skin is warm and dry.  Neurological:     General: No focal deficit present.     Mental Status: She is alert.     Comments: Oriented to self, place, and season  Psychiatric:        Mood and Affect: Mood normal.        Behavior: Behavior normal.        Judgment: Judgment normal.         Assessment and Plan   1. History of recurrent UTIs   2. Confusion      Plan: 1, 2.  Most likely etiology of confusion is UTI.  Will order UA with reflex to culture we will also check blood work CBC, CMP, and sed rate.  Son did want blood cultures checked to rule out bacteremia, however recommended against this as patient's vital signs are stable and patient seems mostly oriented and close to her baseline currently.  We also discussed making sure the patient uses the restroom regularly and changes her depends as soon as they become wet.  We will empirically treat with Bactrim as based on last urine culture bacteria was sensitive to this.  We will also refer to urology for frequent UTIs.   Tests ordered Orders Placed This Encounter  Procedures   CBC with Differential/Platelet   Comp Met (CMET)   Urinalysis with Culture, if indicated   Sedimentation rate   Ambulatory referral to Urology      No orders of the defined types were placed in this encounter.   Patient to follow-up in 1 week for close monitoring.  Ailene Ards, NP

## 2020-11-30 NOTE — Patient Instructions (Addendum)
Sulfamethoxazole; Trimethoprim Tablets What is this medication? SULFAMETHOXAZOLE; TRIMETHOPRIM (suhl fuh meth OK suh zohl; trye METH oh prim) treats infections caused by bacteria. It belongs to a group of medications called sulfonamide antibiotics. It will not treat colds, the flu, or infections caused by viruses. This medicine may be used for other purposes; ask your health care provider or pharmacist if you have questions. COMMON BRAND NAME(S): Bacter-Aid DS, Bactrim, Bactrim DS, Septra, Septra DS What should I tell my care team before I take this medication? They need to know if you have any of these conditions: G6PD deficiency HIV or AIDS Kidney disease Liver disease Low platelets Low red blood cell counts Poor nutrition Stomach or intestine problems like colitis Thyroid disease An unusual or allergic reaction to sulfamethoxazole, trimethoprim, sulfa medications, other medications, foods, dyes, or preservatives Pregnant or trying to get pregnant Breast-feeding How should I use this medication? Take this medication by mouth with a glass of water. Follow the directions on the prescription label. Take your medication at regular intervals. Do not take it more often than directed. Take all of your medication as directed even if you think you are better. Do not skip doses or stop your medication early. Talk to your care team about the use of this medication in children. While this medication may be prescribed for children as young as 2 months for selected conditions, precautions do apply. Overdosage: If you think you have taken too much of this medicine contact a poison control center or emergency room at once. NOTE: This medicine is only for you. Do not share this medicine with others. What if I miss a dose? If you miss a dose, take it as soon as you can. If it is almost time for your next dose, take only that dose. Do not take double or extra doses. What may interact with this  medication? Do not take this medication with any of the following: Dofetilide This medication may also interact with the following: Amantadine Birth control pills Certain medications for blood pressure, heart disease Certain medications for depression, like amitriptyline Certain medications for diabetes, like glipizide or glyburide Certain medications that treat or prevent blood clots like warfarin Cyclosporine Digoxin Diuretics Indomethacin Methotrexate Phenytoin Procainamide Pyrimethamine Zidovudine This list may not describe all possible interactions. Give your health care provider a list of all the medicines, herbs, non-prescription drugs, or dietary supplements you use. Also tell them if you smoke, drink alcohol, or use illegal drugs. Some items may interact with your medicine. What should I watch for while using this medication? Tell your care team if your symptoms do not start to get better or if they get worse. Do not treat diarrhea with over the counter products. Contact your care team if you have diarrhea that lasts more than 2 days or if it is severe and watery. This medication may cause serious skin reactions. They can happen weeks to months after starting the medication. Contact your care team right away if you notice fevers or flu-like symptoms with a rash. The rash may be red or purple and then turn into blisters or peeling of the skin. Or, you might notice a red rash with swelling of the face, lips or lymph nodes in your neck or under your arms. This medication can make you more sensitive to the sun. Keep out of the sun. If you cannot avoid being in the sun, wear protective clothing and sunscreen. Do not use sun lamps or tanning beds/booths. Be careful brushing or   flossing your teeth or using a toothpick because you may get an infection or bleed more easily. If you have any dental work done, tell your dentist you are receiving this medication. What side effects may I notice  from receiving this medication? Side effects that you should report to your care team as soon as possible: Allergic reactions-skin rash, itching, hives, swelling of the face, lips, tongue, or throat Aplastic anemia-unusual weakness or fatigue, dizziness, headache, trouble breathing, increased bleeding or bruising, fever, chills, cough, or sore throat Dry cough, shortness of breath or trouble breathing High potassium level-muscle weakness, fast or irregular heartbeat Liver injury- right upper belly pain, loss of appetite, nausea, light-colored stool, dark yellow or brown urine, yellowing skin or eyes, unusual weakness or fatigue Low blood sugar (hypoglycemia)-tremors or shaking, anxiety, sweating, cold or clammy skin, confusion, dizziness, rapid heartbeat Low sodium level-muscle weakness, fatigue, dizziness, headache, confusion Rash, fever, and swollen lymph nodes Redness, blistering, peeling, or loosening of the skin, including inside the mouth Severe diarrhea, fever Small, pus-filled bumps on skin Unusual vaginal discharge, itching, or odor Side effects that usually do not require medical attention (report to your care team if they continue or are bothersome): Loss of appetite Nausea Vomiting This list may not describe all possible side effects. Call your doctor for medical advice about side effects. You may report side effects to FDA at 1-800-FDA-1088. Where should I keep my medication? Keep out of the reach of children. Store between 15 and 25 degrees C (59 to 77 degrees F). Protect from light. Keep the container tightly closed. Throw away any unused medication after the expiration date. NOTE: This sheet is a summary. It may not cover all possible information. If you have questions about this medicine, talk to your doctor, pharmacist, or health care provider.  2022 Elsevier/Gold Standard (2020-04-19 15:06:53)  

## 2020-12-01 LAB — URINE CULTURE
MICRO NUMBER:: 12409959
SPECIMEN QUALITY:: ADEQUATE

## 2020-12-05 DIAGNOSIS — I130 Hypertensive heart and chronic kidney disease with heart failure and stage 1 through stage 4 chronic kidney disease, or unspecified chronic kidney disease: Secondary | ICD-10-CM | POA: Diagnosis not present

## 2020-12-05 DIAGNOSIS — I5022 Chronic systolic (congestive) heart failure: Secondary | ICD-10-CM | POA: Diagnosis not present

## 2020-12-05 DIAGNOSIS — N3000 Acute cystitis without hematuria: Secondary | ICD-10-CM | POA: Diagnosis not present

## 2020-12-05 DIAGNOSIS — B952 Enterococcus as the cause of diseases classified elsewhere: Secondary | ICD-10-CM | POA: Diagnosis not present

## 2020-12-05 DIAGNOSIS — B9620 Unspecified Escherichia coli [E. coli] as the cause of diseases classified elsewhere: Secondary | ICD-10-CM | POA: Diagnosis not present

## 2020-12-05 DIAGNOSIS — B9689 Other specified bacterial agents as the cause of diseases classified elsewhere: Secondary | ICD-10-CM | POA: Diagnosis not present

## 2020-12-05 DIAGNOSIS — B964 Proteus (mirabilis) (morganii) as the cause of diseases classified elsewhere: Secondary | ICD-10-CM | POA: Diagnosis not present

## 2020-12-05 DIAGNOSIS — R7881 Bacteremia: Secondary | ICD-10-CM | POA: Diagnosis not present

## 2020-12-05 DIAGNOSIS — N1832 Chronic kidney disease, stage 3b: Secondary | ICD-10-CM | POA: Diagnosis not present

## 2020-12-07 DIAGNOSIS — N1832 Chronic kidney disease, stage 3b: Secondary | ICD-10-CM | POA: Diagnosis not present

## 2020-12-07 DIAGNOSIS — I130 Hypertensive heart and chronic kidney disease with heart failure and stage 1 through stage 4 chronic kidney disease, or unspecified chronic kidney disease: Secondary | ICD-10-CM | POA: Diagnosis not present

## 2020-12-07 DIAGNOSIS — N3000 Acute cystitis without hematuria: Secondary | ICD-10-CM | POA: Diagnosis not present

## 2020-12-07 DIAGNOSIS — R7881 Bacteremia: Secondary | ICD-10-CM | POA: Diagnosis not present

## 2020-12-07 DIAGNOSIS — B952 Enterococcus as the cause of diseases classified elsewhere: Secondary | ICD-10-CM | POA: Diagnosis not present

## 2020-12-07 DIAGNOSIS — B9620 Unspecified Escherichia coli [E. coli] as the cause of diseases classified elsewhere: Secondary | ICD-10-CM | POA: Diagnosis not present

## 2020-12-07 DIAGNOSIS — B964 Proteus (mirabilis) (morganii) as the cause of diseases classified elsewhere: Secondary | ICD-10-CM | POA: Diagnosis not present

## 2020-12-07 DIAGNOSIS — I5022 Chronic systolic (congestive) heart failure: Secondary | ICD-10-CM | POA: Diagnosis not present

## 2020-12-07 DIAGNOSIS — B9689 Other specified bacterial agents as the cause of diseases classified elsewhere: Secondary | ICD-10-CM | POA: Diagnosis not present

## 2020-12-14 DIAGNOSIS — R7881 Bacteremia: Secondary | ICD-10-CM | POA: Diagnosis not present

## 2020-12-14 DIAGNOSIS — I130 Hypertensive heart and chronic kidney disease with heart failure and stage 1 through stage 4 chronic kidney disease, or unspecified chronic kidney disease: Secondary | ICD-10-CM | POA: Diagnosis not present

## 2020-12-14 DIAGNOSIS — B952 Enterococcus as the cause of diseases classified elsewhere: Secondary | ICD-10-CM | POA: Diagnosis not present

## 2020-12-14 DIAGNOSIS — I5022 Chronic systolic (congestive) heart failure: Secondary | ICD-10-CM | POA: Diagnosis not present

## 2020-12-14 DIAGNOSIS — B9620 Unspecified Escherichia coli [E. coli] as the cause of diseases classified elsewhere: Secondary | ICD-10-CM | POA: Diagnosis not present

## 2020-12-14 DIAGNOSIS — N1832 Chronic kidney disease, stage 3b: Secondary | ICD-10-CM | POA: Diagnosis not present

## 2020-12-14 DIAGNOSIS — N3000 Acute cystitis without hematuria: Secondary | ICD-10-CM | POA: Diagnosis not present

## 2020-12-14 DIAGNOSIS — B9689 Other specified bacterial agents as the cause of diseases classified elsewhere: Secondary | ICD-10-CM | POA: Diagnosis not present

## 2020-12-14 DIAGNOSIS — B964 Proteus (mirabilis) (morganii) as the cause of diseases classified elsewhere: Secondary | ICD-10-CM | POA: Diagnosis not present

## 2020-12-19 DIAGNOSIS — B9620 Unspecified Escherichia coli [E. coli] as the cause of diseases classified elsewhere: Secondary | ICD-10-CM | POA: Diagnosis not present

## 2020-12-19 DIAGNOSIS — B964 Proteus (mirabilis) (morganii) as the cause of diseases classified elsewhere: Secondary | ICD-10-CM | POA: Diagnosis not present

## 2020-12-19 DIAGNOSIS — N1832 Chronic kidney disease, stage 3b: Secondary | ICD-10-CM | POA: Diagnosis not present

## 2020-12-19 DIAGNOSIS — R7881 Bacteremia: Secondary | ICD-10-CM | POA: Diagnosis not present

## 2020-12-19 DIAGNOSIS — N3000 Acute cystitis without hematuria: Secondary | ICD-10-CM | POA: Diagnosis not present

## 2020-12-19 DIAGNOSIS — I5022 Chronic systolic (congestive) heart failure: Secondary | ICD-10-CM | POA: Diagnosis not present

## 2020-12-19 DIAGNOSIS — B952 Enterococcus as the cause of diseases classified elsewhere: Secondary | ICD-10-CM | POA: Diagnosis not present

## 2020-12-19 DIAGNOSIS — I130 Hypertensive heart and chronic kidney disease with heart failure and stage 1 through stage 4 chronic kidney disease, or unspecified chronic kidney disease: Secondary | ICD-10-CM | POA: Diagnosis not present

## 2020-12-19 DIAGNOSIS — B9689 Other specified bacterial agents as the cause of diseases classified elsewhere: Secondary | ICD-10-CM | POA: Diagnosis not present

## 2021-01-02 ENCOUNTER — Other Ambulatory Visit: Payer: Self-pay | Admitting: Internal Medicine

## 2021-01-11 DIAGNOSIS — N3941 Urge incontinence: Secondary | ICD-10-CM | POA: Diagnosis not present

## 2021-01-11 DIAGNOSIS — N3021 Other chronic cystitis with hematuria: Secondary | ICD-10-CM | POA: Diagnosis not present

## 2021-01-11 DIAGNOSIS — N200 Calculus of kidney: Secondary | ICD-10-CM | POA: Diagnosis not present

## 2021-01-11 DIAGNOSIS — N3020 Other chronic cystitis without hematuria: Secondary | ICD-10-CM | POA: Diagnosis not present

## 2021-01-12 ENCOUNTER — Other Ambulatory Visit: Payer: Self-pay | Admitting: Internal Medicine

## 2021-01-31 ENCOUNTER — Other Ambulatory Visit: Payer: Self-pay

## 2021-01-31 DIAGNOSIS — I10 Essential (primary) hypertension: Secondary | ICD-10-CM

## 2021-01-31 MED ORDER — METOPROLOL SUCCINATE ER 50 MG PO TB24: 50.0000 mg | ORAL_TABLET | Freq: Every day | ORAL | 0 refills | Status: DC

## 2021-02-03 ENCOUNTER — Other Ambulatory Visit: Payer: Self-pay | Admitting: Cardiovascular Disease

## 2021-02-03 DIAGNOSIS — I10 Essential (primary) hypertension: Secondary | ICD-10-CM

## 2021-02-06 ENCOUNTER — Other Ambulatory Visit: Payer: Self-pay | Admitting: Internal Medicine

## 2021-02-12 ENCOUNTER — Telehealth: Payer: Self-pay | Admitting: Internal Medicine

## 2021-02-12 ENCOUNTER — Emergency Department (HOSPITAL_COMMUNITY)
Admission: EM | Admit: 2021-02-12 | Discharge: 2021-02-13 | Disposition: A | Discharge status: Home / Self Care | Payer: Medicare HMO | Attending: Emergency Medicine | Admitting: Emergency Medicine

## 2021-02-12 ENCOUNTER — Other Ambulatory Visit: Payer: Self-pay

## 2021-02-12 DIAGNOSIS — I2510 Atherosclerotic heart disease of native coronary artery without angina pectoris: Secondary | ICD-10-CM | POA: Insufficient documentation

## 2021-02-12 DIAGNOSIS — N183 Chronic kidney disease, stage 3 unspecified: Secondary | ICD-10-CM | POA: Diagnosis not present

## 2021-02-12 DIAGNOSIS — E039 Hypothyroidism, unspecified: Secondary | ICD-10-CM | POA: Diagnosis not present

## 2021-02-12 DIAGNOSIS — K5730 Diverticulosis of large intestine without perforation or abscess without bleeding: Secondary | ICD-10-CM | POA: Diagnosis not present

## 2021-02-12 DIAGNOSIS — N200 Calculus of kidney: Secondary | ICD-10-CM | POA: Insufficient documentation

## 2021-02-12 DIAGNOSIS — N281 Cyst of kidney, acquired: Secondary | ICD-10-CM | POA: Insufficient documentation

## 2021-02-12 DIAGNOSIS — R319 Hematuria, unspecified: Secondary | ICD-10-CM | POA: Diagnosis not present

## 2021-02-12 DIAGNOSIS — M47816 Spondylosis without myelopathy or radiculopathy, lumbar region: Secondary | ICD-10-CM | POA: Diagnosis not present

## 2021-02-12 DIAGNOSIS — Z7982 Long term (current) use of aspirin: Secondary | ICD-10-CM | POA: Insufficient documentation

## 2021-02-12 DIAGNOSIS — Z87891 Personal history of nicotine dependence: Secondary | ICD-10-CM | POA: Insufficient documentation

## 2021-02-12 DIAGNOSIS — I5022 Chronic systolic (congestive) heart failure: Secondary | ICD-10-CM | POA: Diagnosis not present

## 2021-02-12 DIAGNOSIS — Z7901 Long term (current) use of anticoagulants: Secondary | ICD-10-CM | POA: Diagnosis not present

## 2021-02-12 DIAGNOSIS — I130 Hypertensive heart and chronic kidney disease with heart failure and stage 1 through stage 4 chronic kidney disease, or unspecified chronic kidney disease: Secondary | ICD-10-CM | POA: Insufficient documentation

## 2021-02-12 DIAGNOSIS — Z79899 Other long term (current) drug therapy: Secondary | ICD-10-CM | POA: Diagnosis not present

## 2021-02-12 LAB — CBC WITH DIFFERENTIAL/PLATELET
Abs Immature Granulocytes: 0.03 10*3/uL (ref 0.00–0.07)
Basophils Absolute: 0.1 10*3/uL (ref 0.0–0.1)
Basophils Relative: 1 %
Eosinophils Absolute: 0.3 10*3/uL (ref 0.0–0.5)
Eosinophils Relative: 3 %
HCT: 41.5 % (ref 36.0–46.0)
Hemoglobin: 13 g/dL (ref 12.0–15.0)
Immature Granulocytes: 0 %
Lymphocytes Relative: 13 %
Lymphs Abs: 1.4 10*3/uL (ref 0.7–4.0)
MCH: 32.4 pg (ref 26.0–34.0)
MCHC: 31.3 g/dL (ref 30.0–36.0)
MCV: 103.5 fL — ABNORMAL HIGH (ref 80.0–100.0)
Monocytes Absolute: 0.5 10*3/uL (ref 0.1–1.0)
Monocytes Relative: 5 %
Neutro Abs: 8 10*3/uL — ABNORMAL HIGH (ref 1.7–7.7)
Neutrophils Relative %: 78 %
Platelets: 278 10*3/uL (ref 150–400)
RBC: 4.01 MIL/uL (ref 3.87–5.11)
RDW: 12.9 % (ref 11.5–15.5)
WBC: 10.2 10*3/uL (ref 4.0–10.5)
nRBC: 0 % (ref 0.0–0.2)

## 2021-02-12 LAB — COMPREHENSIVE METABOLIC PANEL
ALT: 39 U/L (ref 0–44)
AST: 43 U/L — ABNORMAL HIGH (ref 15–41)
Albumin: 3.9 g/dL (ref 3.5–5.0)
Alkaline Phosphatase: 95 U/L (ref 38–126)
Anion gap: 8 (ref 5–15)
BUN: 20 mg/dL (ref 8–23)
CO2: 25 mmol/L (ref 22–32)
Calcium: 9.3 mg/dL (ref 8.9–10.3)
Chloride: 104 mmol/L (ref 98–111)
Creatinine, Ser: 1.35 mg/dL — ABNORMAL HIGH (ref 0.44–1.00)
GFR, Estimated: 40 mL/min — ABNORMAL LOW (ref >60)
Glucose, Bld: 169 mg/dL — ABNORMAL HIGH (ref 70–99)
Potassium: 4.4 mmol/L (ref 3.5–5.1)
Sodium: 137 mmol/L (ref 135–145)
Total Bilirubin: 1.1 mg/dL (ref 0.3–1.2)
Total Protein: 7.2 g/dL (ref 6.5–8.1)

## 2021-02-12 LAB — LIPASE, BLOOD: Lipase: 31 U/L (ref 11–51)

## 2021-02-12 NOTE — ED Triage Notes (Signed)
Pt reports hematuria. Poor historian, but says today is the first time she has noticed it. Denies burning with urination or flank pain.

## 2021-02-12 NOTE — ED Provider Notes (Signed)
Emergency Medicine Provider Triage Evaluation Note  SHELBE HAGLUND , a 80 y.o. female  was evaluated in triage.  Pt complains of hematuria that was noted earlier today. Denies rectal bleeding or abd pain.  Review of Systems  Positive: hematuria Negative: Abd pain, rectal bleeding  Physical Exam  BP (!) 134/43 (BP Location: Right Arm)   Pulse 77   Temp 98.5 F (36.9 C) (Oral)   Resp 16   SpO2 100%  Gen:   Awake, no distress   Resp:  Normal effort  MSK:   Moves extremities without difficulty  Other:  Abd soft and nontender  Medical Decision Making  Medically screening exam initiated at 5:59 PM.  Appropriate orders placed.  GRISELDA BRAMBLETT was informed that the remainder of the evaluation will be completed by another provider, this initial triage assessment does not replace that evaluation, and the importance of remaining in the ED until their evaluation is complete.     Karrie Meres, PA-C 02/12/21 1800    Jacalyn Lefevre, MD 02/12/21 [phone]

## 2021-02-12 NOTE — Telephone Encounter (Signed)
Patient's son [name] states patient is still having blood in her urine  Caller states the blood w/ urine is now heavier, caller states there are stains of light blood on patient's depends, sheets, and mattress pad  Caller transferred to team health

## 2021-02-13 ENCOUNTER — Emergency Department (HOSPITAL_COMMUNITY): Payer: Medicare HMO

## 2021-02-13 DIAGNOSIS — R319 Hematuria, unspecified: Secondary | ICD-10-CM | POA: Diagnosis not present

## 2021-02-13 DIAGNOSIS — M47816 Spondylosis without myelopathy or radiculopathy, lumbar region: Secondary | ICD-10-CM | POA: Diagnosis not present

## 2021-02-13 DIAGNOSIS — N281 Cyst of kidney, acquired: Secondary | ICD-10-CM | POA: Diagnosis not present

## 2021-02-13 DIAGNOSIS — N200 Calculus of kidney: Secondary | ICD-10-CM | POA: Diagnosis not present

## 2021-02-13 LAB — URINALYSIS, ROUTINE W REFLEX MICROSCOPIC
Glucose, UA: NEGATIVE mg/dL
Ketones, ur: NEGATIVE mg/dL
Nitrite: NEGATIVE
Protein, ur: 100 mg/dL — AB
Specific Gravity, Urine: >1.03 — ABNORMAL HIGH (ref 1.005–1.030)
pH: 6 (ref 5.0–8.0)

## 2021-02-13 LAB — URINALYSIS, MICROSCOPIC (REFLEX): RBC / HPF: >50 RBC/hpf (ref 0–5)

## 2021-02-13 NOTE — Discharge Instructions (Signed)
Urine test today did not show signs of infection but there is blood in the urine.  This can be from your blood thinner Eliquis. We do recommend that you follow-up with urology to make sure this clears up-- can call in the morning to arrange appt. Return here for any new/acute changes.

## 2021-02-13 NOTE — Telephone Encounter (Signed)
Transferred to [facility].    Caller's mom is bleeding.There is blood in her urine. Now there is blood soaking through her depends, sheets and mattress. She is disoriented. He is bringing her to the ER.

## 2021-02-13 NOTE — ED Provider Notes (Signed)
[facility] Provider Note   CSN: [id number] Arrival date & time: 02/12/21  1740     History Chief Complaint  Patient presents with   Hematuria    Yvonne Bailey is a 80 y.o. female.  The history is provided by the patient and medical records.  Hematuria  80 year old female with history of coronary artery disease, hypertension, hyperlipidemia, ischemic cardiomyopathy, prior stroke, presenting to the ED for hematuria.  States she was told urine started looking dark 3 weeks ago at her fellowship center.  States she has not noticed any increased urinary frequency or dysuria.  She has not had any abdominal pain or flank pain.  She denies any recent UTI but does have history of this in the past.  No fever/chills.  No nausea/vomiting.  Denies any confusion or AMS.  She is on eliquis but denies any having issues with bleeding from this before.  Past Medical History:  Diagnosis Date   Bilateral leg edema 07/19/2015   CAD (coronary artery disease)    a. anterior MI s/p PCI in 1992. b. cath 08/2015 with severe three-vessel CAD turned down for CABG and underwent DESx5 to prox Cx/OM2/D1/oRCA/mRCA   Carotid artery disease ([place])    a. carotid duplex 03/2015 showed 1-39% BICA, normal subclavian arteries, chronically occluded left vertebral, f/u recommended only PRN.   DVT (deep venous thrombosis) ([place])    X1   History of nuclear stress test    a. Myoview 6/17: EF 20-25%, mid anteroseptal, apical anterior, apical septal, apical inferior, apical lateral and apical scar, no ischemia, intermediate risk   HTN (hypertension)    Hyperlipidemia    Hypokalemia    Hypothyroidism    Ischemic cardiomyopathy    a. Echo 6/17: EF 20-25%, apex appears akinetic, MAC, moderate MR, moderate LAE, mild RVE, trivial PI, PASP 47 mmHg (needs repeat with Definity contrast).  b. Limited echo with Definity contrast 7/17: EF 25-30%, moderate to severe LAE. c. Limited Echo 2018  showed EF 40-45%.   Lacunar infarction ([place]) 12/17/2012   Dr Krista Blue, Neurology    Leukocytosis    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   Longstanding persistent atrial fibrillation ([place])    MI (myocardial infarction) ([place]) 1992   PAD (peripheral artery disease) ([place])    Right SFA occlusion, severe disease left CFA and SFA   Prediabetes 07/28/2016   Stage 3 chronic kidney disease ([place])    Stroke ([place])    a. 02/2017 in setting of noncompliance with Eliquis   TIA (transient ischemic attack)    Tricuspid regurgitation     Patient Active Problem List   Diagnosis Date Noted   AKI (acute kidney injury) ([place]) 10/31/2020   Bacteremia due to Enterococcus 10/31/2020   Confusion 10/17/2020   Acute cystitis 10/17/2020   Decreased GFR 10/17/2020   Rash 08/29/2020   TIA (transient ischemic attack) 06/10/2020   Anxiety and depression 03/28/2020   Overactive bladder 03/28/2020   Lack of motivation 65/78/4696   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   Aphasia as late effect of cerebrovascular accident (CVA)    Acute ischemic cerebrovascular accident (CVA) involving left middle cerebral artery territory [facility])    Coronary artery disease involving native coronary artery without angina pectoris    Stage 3 chronic kidney disease ([place])    Prediabetes 07/28/2016   DOE (dyspnea on exertion) 07/09/2016   Gait instability 07/09/2016   Cardiomyopathy, ischemic    Chronic systolic heart failure ([place]) 08/21/2015   Bilateral leg edema 07/19/2015  Atrial fibrillation ([place]) 07/19/2015   Urinary urgency 03/30/2015   Lacunar infarction ([place]) 12/17/2012   Small vessel disease, cerebrovascular 12/17/2012   CAROTID BRUIT, RIGHT 08/10/2008   Peripheral vascular disease ([place]) 06/04/2007   Diverticulosis of large intestine 06/04/2007   Hypothyroidism 02/24/2007   Dyslipidemia 02/24/2007   Essential hypertension 02/24/2007   Acute thromboembolism of deep veins of lower extremity ([place]) 01/21/2007   Coronary  atherosclerosis 10/29/2006    Past Surgical History:  Procedure Laterality Date   APPENDECTOMY     at hysterectomy and USO for fibroids, Dr. Ysidro Evert   CARDIAC CATHETERIZATION  1992   Dr Eustace Quail   CARDIAC CATHETERIZATION N/A 09/06/2015   Procedure: Right/Left Heart Cath and Coronary Angiography;  Surgeon: Burnell Blanks, MD;  Location: [facility];  Service: Cardiovascular;  Laterality: N/A;   CARDIAC CATHETERIZATION N/A 09/07/2015   Procedure: Coronary Stent Intervention;  Surgeon: Burnell Blanks, MD;  Location: [facility];  Service: Cardiovascular;  Laterality: N/A;   COLONOSCOPY     negative; 2008, Dr. Delfin Edis   fracture LLE     '94; pinned   IR ANGIO INTRA EXTRACRAN SEL COM CAROTID INNOMINATE BILAT MOD SED  03/02/2017   IR ANGIO INTRA EXTRACRAN SEL COM CAROTID INNOMINATE BILAT MOD SED  04/23/2018   IR ANGIO VERTEBRAL SEL SUBCLAVIAN INNOMINATE BILAT MOD SED  04/23/2018   IR ANGIO VERTEBRAL SEL VERTEBRAL UNI R MOD SED  03/02/2017   IR RADIOLOGIST EVAL & MGMT  04/28/2017   IR US GUIDE VASC ACCESS RIGHT  04/23/2018   RADIOLOGY WITH ANESTHESIA N/A 03/02/2017   Procedure: RADIOLOGY WITH ANESTHESIA;  Surgeon: Luanne Bras, MD;  Location: [facility];  Service: Radiology;  Laterality: N/A;   TEE WITHOUT CARDIOVERSION N/A 11/02/2020   Procedure: TRANSESOPHAGEAL ECHOCARDIOGRAM (TEE);  Surgeon: Sanda Klein, MD;  Location: [facility];  Service: Cardiovascular;  Laterality: N/A;   TONSILLECTOMY     TOTAL ABDOMINAL HYSTERECTOMY     & BSO for fibroids     OB History     Gravida  4   Para  1   Term  1   Preterm      AB  3   Living  1      SAB  3   IAB      Ectopic      Multiple      Live Births  1           Family History  Problem Relation Age of Onset   Diabetes Mother    Hypertension Mother    Stroke Brother        ?> 63   Coronary artery disease Brother        stent in 33s   Cancer Neg Hx     Social History    Tobacco Use   Smoking status: Former    Types: Cigarettes    Quit date: 03/11/1990    Years since quitting: 30.9   Smokeless tobacco: Never   Tobacco comments:    smoked 1973- 1992, up to < 5 cigarettes  Vaping Use   Vaping Use: Never used  Substance Use Topics   Alcohol use: No   Drug use: No    Home Medications Prior to Admission medications   Medication Sig Start Date End Date Taking? Authorizing Provider  apixaban (ELIQUIS) 5 MG TABS tablet Take 1 tablet (5 mg total) by mouth 2 (two) times daily. 07/27/20   Burnell Blanks, MD  aspirin EC 81 MG tablet Take 1 tablet (81 mg total) by mouth daily. 04/21/17   Imogene Burn, PA-C  atorvastatin (LIPITOR) 80 MG tablet TAKE 1 TABLET(80 MG) BY MOUTH DAILY 01/02/21   Burns, Claudina Lick, MD  benazepril (LOTENSIN) 20 MG tablet TAKE 1 AND 1/2 TABLETS(30 MG) BY MOUTH DAILY 02/06/21   Binnie Rail, MD  brimonidine (ALPHAGAN) 0.2 % ophthalmic solution Place 1 drop into both eyes 2 (two) times daily. 12/24/17   [provider]  dorzolamide-timolol (COSOPT) 22.3-6.8 MG/ML ophthalmic solution Place 1 drop into both eyes 2 (two) times daily.     [provider]  furosemide (LASIX) 20 MG tablet Take 1 tablet (20 mg total) by mouth daily as needed for edema. 11/03/20   Terrilee Croak, MD  levothyroxine (SYNTHROID) 88 MCG tablet TAKE 1 TABLET(88 MCG) BY MOUTH DAILY 11/06/20   Binnie Rail, MD  metoprolol succinate (TOPROL-XL) 50 MG 24 hr tablet TAKE 1 TABLET(50 MG) BY MOUTH DAILY. PLEASE MAKE OVERDUE APPOINTMENT WITH DOCTOR [place] ANYMORE REFILLS. THANK YOU 2 ND ATTEMPT 02/06/21   Burnell Blanks, MD  MYRBETRIQ 25 MG TB24 tablet TAKE 1 TABLET(25 MG) BY MOUTH DAILY Patient taking differently: Take 25 mg by mouth daily. 09/05/20   Binnie Rail, MD  nitroGLYCERIN (NITROSTAT) 0.4 MG SL tablet Place 1 tablet (0.4 mg total) under the tongue every 5 (five) minutes as needed for chest pain. 09/08/15   Arbutus Leas, NP   nystatin-triamcinolone ointment [facility]) Apply 1 application topically 2 (two) times daily. 08/29/20   Hoyt Koch, MD  polyethylene glycol (MIRALAX) 17 g packet Take one dose 3 times a day until bowel clear, maximum of 3 consecutive days. Patient taking differently: Take 17 g by mouth daily as needed for mild constipation. 10/30/20   Charlann Lange, PA-C  sertraline (ZOLOFT) 50 MG tablet TAKE 1 TABLET(50 MG) BY MOUTH DAILY 11/14/20   Binnie Rail, MD  spironolactone (ALDACTONE) 25 MG tablet TAKE 1 TABLET(25 MG) BY MOUTH DAILY 11/06/20   Binnie Rail, MD  sulfamethoxazole-trimethoprim (BACTRIM DS) 800-160 MG tablet Take 1 tablet by mouth 2 (two) times daily. 11/30/20   Ailene Ards, NP  travoprost, benzalkonium, (TRAVATAN) 0.004 % ophthalmic solution Place 1 drop into both eyes at bedtime.    [provider]    Allergies    Definity [perflutren lipid microsphere] and Cilostazol  Review of Systems   Review of Systems  Genitourinary:  Positive for hematuria.  All other systems reviewed and are negative.  Physical Exam Updated Vital Signs BP 140/70 (BP Location: Left Arm)   Pulse 60   Temp 98.5 F (36.9 C) (Oral)   Resp 16   SpO2 99%   Physical Exam Vitals and nursing note reviewed.  Constitutional:      Appearance: She is well-developed.     Comments: Elderly, NAD  HENT:     Head: Normocephalic and atraumatic.  Eyes:     Conjunctiva/sclera: Conjunctivae normal.     Pupils: Pupils are equal, round, and reactive to light.  Cardiovascular:     Rate and Rhythm: Normal rate and regular rhythm.     Heart sounds: Normal heart sounds.  Pulmonary:     Effort: Pulmonary effort is normal. No respiratory distress.     Breath sounds: Normal breath sounds. No rhonchi.  Abdominal:     General: Bowel sounds are normal.     Palpations: Abdomen is soft.     Tenderness:  There is no abdominal tenderness. There is no rebound.     Comments: Soft, non-tender, no CVA  tenderness  Musculoskeletal:        General: Normal range of motion.     Cervical back: Normal range of motion.  Skin:    General: Skin is warm and dry.  Neurological:     Mental Status: She is alert and oriented to person, place, and time.    ED Results / Procedures / Treatments   Labs (all labs ordered are listed, but only abnormal results are displayed) Labs Reviewed  CBC WITH DIFFERENTIAL/PLATELET - Abnormal; Notable for the following components:      Result Value   MCV 103.5 (*)    Neutro Abs 8.0 (*)    All other components within normal limits  COMPREHENSIVE METABOLIC PANEL - Abnormal; Notable for the following components:   Glucose, Bld 169 (*)    Creatinine, Ser 1.35 (*)    AST 43 (*)    GFR, Estimated 40 (*)    All other components within normal limits  URINALYSIS, ROUTINE W REFLEX MICROSCOPIC - Abnormal; Notable for the following components:   Color, Urine AMBER (*)    APPearance CLOUDY (*)    Specific Gravity, Urine >1.030 (*)    Hgb urine dipstick LARGE (*)    Bilirubin Urine SMALL (*)    Protein, ur 100 (*)    Leukocytes,Ua TRACE (*)    All other components within normal limits  URINALYSIS, MICROSCOPIC (REFLEX) - Abnormal; Notable for the following components:   Bacteria, UA RARE (*)    All other components within normal limits  URINE CULTURE  LIPASE, BLOOD    EKG None  Radiology CT Renal Stone Study  Result Date: 02/13/2021 CLINICAL DATA:  Hematuria EXAM: CT ABDOMEN AND PELVIS WITHOUT CONTRAST TECHNIQUE: Multidetector CT imaging of the abdomen and pelvis was performed following the standard protocol without IV contrast. COMPARISON:  10/30/2020 FINDINGS: Lower chest: No acute abnormality. Hepatobiliary: No focal liver abnormality is seen. No gallstones, gallbladder wall thickening, or biliary dilatation. Pancreas: Unremarkable. No pancreatic ductal dilatation or surrounding inflammatory changes. Spleen: Normal in size without focal abnormality.  Adrenals/Urinary Tract: Adrenal glands are within normal limits. Cystic lesions are noted in the kidneys bilaterally stable in appearance from the prior exam. Nonobstructing right renal stone is noted in the upper pole measuring up to 17 mm. Nonobstructing lower pole stone is noted as well stable in appearance from the prior exam. No left-sided calculi are noted. No obstructive changes are seen. Bladder is decompressed. Stomach/Bowel: Scattered diverticular change of the colon is noted without evidence of diverticulitis. No obstructive or inflammatory changes are seen. The appendix is not well visualized consistent with the prior surgical history. Small bowel and stomach are within normal limits. Vascular/Lymphatic: Aortic atherosclerosis. No enlarged abdominal or pelvic lymph nodes. Reproductive: Status post hysterectomy. No adnexal masses. Other: No abdominal wall hernia or abnormality. No abdominopelvic ascites. Musculoskeletal: Degenerative changes of lumbar spine are noted. IMPRESSION: Nonobstructing right renal stones similar to that seen on the prior exam. Bilateral renal cysts. Diverticulosis without diverticulitis. Electronically Signed   By: Inez Catalina M.D.   On: 02/13/2021 01:35    Procedures Procedures   Medications Ordered in ED Medications - No data to display  ED Course  I have reviewed the triage vital signs and the nursing notes.  Pertinent labs & imaging results that were available during my care of the patient were reviewed by me and considered in my  medical decision making (see chart for details).    MDM Rules/Calculators/A&P                           80 year old female presenting to the ED with hematuria.  She has not had any associated flank pain, dysuria, urinary frequency.  She is afebrile and nontoxic.  Her abdomen is soft and nontender.  No focal CVA tenderness.  Her labs are reassuring.  UA with rare bacteria but large amount of blood.  Culture sent.  Does have history  of kidney stones so CT renal study obtained but no acute ureteral stone.  Suspect her hematuria may be related to her Eliquis but given painless hematuria, will have her follow-up with urology.  Can return here for any new/acute changes.  Discussed with attending physician, Dr. Ralene Bathe, who agrees with plan of care.  Final Clinical Impression(s) / ED Diagnoses Final diagnoses:  Hematuria, unspecified type    Rx / DC Orders ED Discharge Orders     None        Larene Pickett, PA-C 02/13/21 0315    Quintella Reichert, MD 02/13/21 [phone]

## 2021-02-13 NOTE — ED Notes (Signed)
Spoke with pt's son and gave him discharge instructions and update.  Pt ready for d/c

## 2021-02-13 NOTE — ED Notes (Signed)
Son [name] [phone] would like an update

## 2021-02-15 DIAGNOSIS — R310 Gross hematuria: Secondary | ICD-10-CM | POA: Diagnosis not present

## 2021-02-15 DIAGNOSIS — N3021 Other chronic cystitis with hematuria: Secondary | ICD-10-CM | POA: Diagnosis not present

## 2021-03-18 ENCOUNTER — Encounter: Payer: Self-pay | Admitting: Internal Medicine

## 2021-03-18 DIAGNOSIS — R319 Hematuria, unspecified: Secondary | ICD-10-CM | POA: Insufficient documentation

## 2021-03-18 NOTE — Patient Instructions (Signed)
  Blood work was ordered.     Medications changes include :     Your prescription(s) have been submitted to your pharmacy. Please take as directed and contact our office if you believe you are having problem(s) with the medication(s).   A referral was ordered for        Someone from their office will call you to schedule an appointment.    Please followup in 6 months  

## 2021-03-18 NOTE — Progress Notes (Signed)
Subjective:    Patient ID: Yvonne Bailey, female    DOB: Jul 06, 1940, 81 y.o.   MRN: [id number]  This visit occurred during the SARS-CoV-2 public health emergency.  Safety protocols were in place, including screening questions prior to the visit, additional usage of staff PPE, and extensive cleaning of exam room while observing appropriate contact time as indicated for disinfecting solutions.     HPI The patient is here for follow up from the ED.  12/5 - she was taken to ED for hematuria.  Her urine started looking dark 3 weeks prior.  Denied increase freq, dysuria, abd pain/flank pain.  No fever, confusion or nausea.   VSS.  Exam at baseline.  UA had large blood, but rare bacteria.  Culture was going to be sent, but looks like it was discontinued.   Has history of stones - CT scan showed no acute ureteral stone. Hematuria possibly related to eliquis - advised f/u with urology.     She is also here for follow up of her chronic medical conditions - htn afib, CAD, hypothyroidism, CKD, hld, asphasia d/t Cva, prediabetes, anxiety, depression, overactive bladder.   Medications and allergies reviewed with patient and updated if appropriate.  Patient Active Problem List   Diagnosis Date Noted   Hematuria 03/18/2021   Acute cystitis 10/17/2020   Rash 08/29/2020   TIA (transient ischemic attack) 06/10/2020   Anxiety and depression 03/28/2020   Overactive bladder 03/28/2020   Lack of motivation 22/04/5425   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   Aphasia as late effect of cerebrovascular accident (CVA)    Acute ischemic cerebrovascular accident (CVA) involving left middle cerebral artery [facility])    Coronary artery disease involving native coronary artery without angina pectoris    Stage 3 chronic kidney disease ([place])    Prediabetes 07/28/2016   DOE (dyspnea on exertion) 07/09/2016   Gait instability 07/09/2016   Cardiomyopathy, ischemic    Chronic systolic  heart failure ([place]) 08/21/2015   Bilateral leg edema 07/19/2015   Atrial fibrillation ([place]) 07/19/2015   Urinary urgency 03/30/2015   Lacunar infarction ([place]) 12/17/2012   Small vessel disease, cerebrovascular 12/17/2012   CAROTID BRUIT, RIGHT 08/10/2008   Peripheral vascular disease ([place]) 06/04/2007   Diverticulosis of large intestine 06/04/2007   Hypothyroidism 02/24/2007   Dyslipidemia 02/24/2007   Essential hypertension 02/24/2007   Acute thromboembolism of deep veins of lower extremity ([place]) 01/21/2007    Current Outpatient Medications on File Prior to Visit  Medication Sig Dispense Refill   aspirin EC 81 MG tablet Take 1 tablet (81 mg total) by mouth daily. 90 tablet 3   atorvastatin (LIPITOR) 80 MG tablet TAKE 1 TABLET(80 MG) BY MOUTH DAILY 90 tablet 1   benazepril (LOTENSIN) 20 MG tablet TAKE 1 AND 1/2 TABLETS(30 MG) BY MOUTH DAILY 135 tablet 1   brimonidine (ALPHAGAN) 0.2 % ophthalmic solution Place 1 drop into both eyes 2 (two) times daily.     dorzolamide-timolol (COSOPT) 22.3-6.8 MG/ML ophthalmic solution Place 1 drop into both eyes 2 (two) times daily.      furosemide (LASIX) 20 MG tablet Take 1 tablet (20 mg total) by mouth daily as needed for edema. 15 tablet 0   levothyroxine (SYNTHROID) 88 MCG tablet TAKE 1 TABLET(88 MCG) BY MOUTH DAILY 90 tablet 1   metoprolol succinate (TOPROL-XL) 50 MG 24 hr tablet TAKE 1 TABLET(50 MG) BY MOUTH DAILY. PLEASE MAKE OVERDUE APPOINTMENT WITH DOCTOR [place] ANYMORE REFILLS. [place] 2  ND ATTEMPT 15 tablet 0   MYRBETRIQ 25 MG TB24 tablet TAKE 1 TABLET(25 MG) BY MOUTH DAILY (Patient taking differently: Take 25 mg by mouth daily.) 30 tablet 5   nitroGLYCERIN (NITROSTAT) 0.4 MG SL tablet Place 1 tablet (0.4 mg total) under the tongue every 5 (five) minutes as needed for chest pain. 25 tablet 1   nystatin-triamcinolone ointment (MYCOLOG) Apply 1 application topically 2 (two) times daily. 100 g 0   polyethylene  glycol (MIRALAX) 17 g packet Take one dose 3 times a day until bowel clear, maximum of 3 consecutive days. (Patient taking differently: Take 17 g by mouth daily as needed for mild constipation.) 9 each 0   sertraline (ZOLOFT) 50 MG tablet TAKE 1 TABLET(50 MG) BY MOUTH DAILY 30 tablet 5   spironolactone (ALDACTONE) 25 MG tablet TAKE 1 TABLET(25 MG) BY MOUTH DAILY 90 tablet 1   travoprost, benzalkonium, (TRAVATAN) 0.004 % ophthalmic solution Place 1 drop into both eyes at bedtime.     No current facility-administered medications on file prior to visit.    Past Medical History:  Diagnosis Date   Bilateral leg edema 07/19/2015   CAD (coronary artery disease)    a. anterior MI s/p PCI in 1992. b. cath 08/2015 with severe three-vessel CAD turned down for CABG and underwent DESx5 to prox Cx/OM2/D1/oRCA/mRCA   Carotid artery disease ([place])    a. carotid duplex 03/2015 showed 1-39% BICA, normal subclavian arteries, chronically occluded left vertebral, f/u recommended only PRN.   DVT (deep venous thrombosis) ([place])    X1   History of nuclear stress test    a. Myoview 6/17: EF 20-25%, mid anteroseptal, apical anterior, apical septal, apical inferior, apical lateral and apical scar, no ischemia, intermediate risk   HTN (hypertension)    Hyperlipidemia    Hypokalemia    Hypothyroidism    Ischemic cardiomyopathy    a. Echo 6/17: EF 20-25%, apex appears akinetic, MAC, moderate MR, moderate LAE, mild RVE, trivial PI, PASP 47 mmHg (needs repeat with Definity contrast).  b. Limited echo with Definity contrast 7/17: EF 25-30%, moderate to severe LAE. c. Limited Echo 2018 showed EF 40-45%.   Lacunar infarction ([place]) 12/17/2012   Dr Krista Blue, Neurology    Leukocytosis    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   Longstanding persistent atrial fibrillation ([facility])    MI (myocardial infarction) ([place]) 1992   PAD (peripheral artery disease) ([facility])    Right SFA occlusion, severe disease left CFA and SFA    Prediabetes 07/28/2016   Stage 3 chronic kidney disease ([place])    Stroke ([place])    a. 02/2017 in setting of noncompliance with Eliquis   TIA (transient ischemic attack)    Tricuspid regurgitation     Past Surgical History:  Procedure Laterality Date   APPENDECTOMY     at hysterectomy and USO for fibroids, Dr. Ysidro Evert   CARDIAC CATHETERIZATION  1992   Dr Eustace Quail   CARDIAC CATHETERIZATION N/A 09/06/2015   Procedure: Right/Left Heart Cath and Coronary Angiography;  Surgeon: Burnell Blanks, MD;  Location: [place] [facility];  Service: Cardiovascular;  Laterality: N/A;   CARDIAC CATHETERIZATION N/A 09/07/2015   Procedure: Coronary Stent Intervention;  Surgeon: Burnell Blanks, MD;  Location: [place] [facility];  Service: Cardiovascular;  Laterality: N/A;   COLONOSCOPY     negative; 2008, Dr. Delfin Edis   fracture LLE     '94; pinned   IR ANGIO INTRA EXTRACRAN SEL COM CAROTID INNOMINATE BILAT MOD SED  03/02/2017   IR ANGIO INTRA EXTRACRAN SEL COM CAROTID INNOMINATE BILAT MOD SED  04/23/2018   IR ANGIO VERTEBRAL SEL SUBCLAVIAN INNOMINATE BILAT MOD SED  04/23/2018   IR ANGIO VERTEBRAL SEL VERTEBRAL UNI R MOD SED  03/02/2017   IR RADIOLOGIST EVAL & MGMT  04/28/2017   IR US GUIDE VASC ACCESS RIGHT  04/23/2018   RADIOLOGY WITH ANESTHESIA N/A 03/02/2017   Procedure: RADIOLOGY WITH ANESTHESIA;  Surgeon: Luanne Bras, MD;  Location: [place];  Service: Radiology;  Laterality: N/A;   TEE WITHOUT CARDIOVERSION N/A 11/02/2020   Procedure: TRANSESOPHAGEAL ECHOCARDIOGRAM (TEE);  Surgeon: Sanda Klein, MD;  Location: [facility];  Service: Cardiovascular;  Laterality: N/A;   TONSILLECTOMY     TOTAL ABDOMINAL HYSTERECTOMY     & BSO for fibroids    Social History   Socioeconomic History   Marital status: Married    Spouse name: [name]   Number of children: 1   Years of education: college   Highest education level: Not on file  Occupational History    Occupation: Pharmacist, hospital    Comment: Retired  Tobacco Use   Smoking status: Former    Types: Cigarettes    Quit date: 03/11/1990    Years since quitting: 31.0   Smokeless tobacco: Never   Tobacco comments:    smoked 1973- 1992, up to < 5 cigarettes  Vaping Use   Vaping Use: Never used  Substance and Sexual Activity   Alcohol use: No   Drug use: No   Sexual activity: Not Currently    Birth control/protection: Surgical  Other Topics Concern   Not on file  Social History Narrative   She works as needed Oceanographer, 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 08/31/09- designated party form signed appointing, husband Tanita Palinkas; ok to leave msg on home phone [phone].      Caffeine Small Amount one cup very rare.   Right handed.   One child- [name]   Social Determinants of Health   Financial Resource Strain: Not on file  Food Insecurity: Not on file  Transportation Needs: Not on file  Physical Activity: Not on file  Stress: Not on file  Social Connections: Not on file    Family History  Problem Relation Age of Onset   Diabetes Mother    Hypertension Mother    Stroke Brother        ?> 6   Coronary artery disease Brother        stent in 11s   Cancer Neg Hx     Review of Systems     Objective:  There were no vitals filed for this visit. BP Readings from Last 3 Encounters:  02/13/21 (!) 142/75  11/30/20 122/82  11/15/20 128/68   Wt Readings from Last 3 Encounters:  11/15/20 163 lb (73.9 kg)  11/02/20 163 lb 2.3 oz (74 kg)  10/29/20 163 lb (73.9 kg)   There is no height or weight on file to calculate BMI.   Physical Exam    Constitutional: Appears well-developed and well-nourished. No distress.  HENT:  Head: Normocephalic and atraumatic.  Neck: Neck supple. No tracheal deviation present. No thyromegaly present.  No cervical lymphadenopathy Cardiovascular: Normal rate, regular rhythm and normal heart sounds.   No murmur heard. No carotid bruit  .  No edema Pulmonary/Chest: Effort normal and breath sounds normal. No respiratory distress. No has no wheezes. No rales.  Skin: Skin is warm and dry. Not diaphoretic.  Psychiatric: Normal mood and affect.  Behavior is normal.      Assessment & Plan:    See Problem List for Assessment and Plan of chronic medical problems.     This encounter was created in error - please disregard.

## 2021-03-19 ENCOUNTER — Encounter: Payer: Medicare HMO | Admitting: Internal Medicine

## 2021-03-19 DIAGNOSIS — I4819 Other persistent atrial fibrillation: Secondary | ICD-10-CM

## 2021-03-19 DIAGNOSIS — I2510 Atherosclerotic heart disease of native coronary artery without angina pectoris: Secondary | ICD-10-CM

## 2021-03-19 DIAGNOSIS — N1831 Chronic kidney disease, stage 3a: Secondary | ICD-10-CM

## 2021-03-19 DIAGNOSIS — R7303 Prediabetes: Secondary | ICD-10-CM

## 2021-03-19 DIAGNOSIS — E785 Hyperlipidemia, unspecified: Secondary | ICD-10-CM

## 2021-03-19 DIAGNOSIS — I10 Essential (primary) hypertension: Secondary | ICD-10-CM

## 2021-03-19 DIAGNOSIS — E038 Other specified hypothyroidism: Secondary | ICD-10-CM

## 2021-03-19 DIAGNOSIS — F32A Depression, unspecified: Secondary | ICD-10-CM

## 2021-03-19 DIAGNOSIS — R310 Gross hematuria: Secondary | ICD-10-CM

## 2021-03-19 DIAGNOSIS — I69320 Aphasia following cerebral infarction: Secondary | ICD-10-CM

## 2021-03-28 ENCOUNTER — Other Ambulatory Visit: Payer: Self-pay

## 2021-03-28 DIAGNOSIS — I739 Peripheral vascular disease, unspecified: Secondary | ICD-10-CM

## 2021-03-28 MED ORDER — APIXABAN 5 MG PO TABS: 5.0000 mg | ORAL_TABLET | Freq: Two times a day (BID) | ORAL | 0 refills | Status: DC

## 2021-03-29 ENCOUNTER — Encounter: Payer: Self-pay | Admitting: Internal Medicine

## 2021-03-29 NOTE — Patient Instructions (Addendum)
°Blood work was ordered.   ° ° °Medications changes include :    ° °Your prescription(s) have been submitted to your pharmacy. Please take as directed and contact our office if you believe you are having problem(s) with the medication(s). ° ° °A referral was ordered for        Someone from their office will call you to schedule an appointment.  ° ° °Please followup in 6 months ° ° ° °Health Maintenance, Female °Adopting a healthy lifestyle and getting preventive care are important in promoting health and wellness. Ask your health care provider about: °The right schedule for you to have regular tests and exams. °Things you can do on your own to prevent diseases and keep yourself healthy. °What should I know about diet, weight, and exercise? °Eat a healthy diet ° °Eat a diet that includes plenty of vegetables, fruits, low-fat dairy products, and lean protein. °Do not eat a lot of foods that are high in solid fats, added sugars, or sodium. °Maintain a healthy weight °Body mass index (BMI) is used to identify weight problems. It estimates body fat based on height and weight. Your health care provider can help determine your BMI and help you achieve or maintain a healthy weight. °Get regular exercise °Get regular exercise. This is one of the most important things you can do for your health. Most adults should: °Exercise for at least 150 minutes each week. The exercise should increase your heart rate and make you sweat (moderate-intensity exercise). °Do strengthening exercises at least twice a week. This is in addition to the moderate-intensity exercise. °Spend less time sitting. Even light physical activity can be beneficial. °Watch cholesterol and blood lipids °Have your blood tested for lipids and cholesterol at 81 years of age, then have this test every 5 years. °Have your cholesterol levels checked more often if: °Your lipid or cholesterol levels are high. °You are older than 81 years of age. °You are at high risk  for heart disease. °What should I know about cancer screening? °Depending on your health history and family history, you may need to have cancer screening at various ages. This may include screening for: °Breast cancer. °Cervical cancer. °Colorectal cancer. °Skin cancer. °Lung cancer. °What should I know about heart disease, diabetes, and high blood pressure? °Blood pressure and heart disease °High blood pressure causes heart disease and increases the risk of stroke. This is more likely to develop in people who have high blood pressure readings or are overweight. °Have your blood pressure checked: °Every 3-5 years if you are 18-39 years of age. °Every year if you are 40 years old or older. °Diabetes °Have regular diabetes screenings. This checks your fasting blood sugar level. Have the screening done: °Once every three years after age 40 if you are at a normal weight and have a low risk for diabetes. °More often and at a younger age if you are overweight or have a high risk for diabetes. °What should I know about preventing infection? °Hepatitis B °If you have a higher risk for hepatitis B, you should be screened for this virus. Talk with your health care provider to find out if you are at risk for hepatitis B infection. °Hepatitis C °Testing is recommended for: °Everyone born from 1945 through 1965. °Anyone with known risk factors for hepatitis C. °Sexually transmitted infections (STIs) °Get screened for STIs, including gonorrhea and chlamydia, if: °You are sexually active and are younger than 81 years of age. °You are older than   81 years of age and your health care provider tells you that you are at risk for this type of infection. °Your sexual activity has changed since you were last screened, and you are at increased risk for chlamydia or gonorrhea. Ask your health care provider if you are at risk. °Ask your health care provider about whether you are at high risk for HIV. Your health care provider may recommend  a prescription medicine to help prevent HIV infection. If you choose to take medicine to prevent HIV, you should first get tested for HIV. You should then be tested every 3 months for as long as you are taking the medicine. °Pregnancy °If you are about to stop having your period (premenopausal) and you may become pregnant, seek counseling before you get pregnant. °Take 400 to 800 micrograms (mcg) of folic acid every day if you become pregnant. °Ask for birth control (contraception) if you want to prevent pregnancy. °Osteoporosis and menopause °Osteoporosis is a disease in which the bones lose minerals and strength with aging. This can result in bone fractures. If you are 65 years old or older, or if you are at risk for osteoporosis and fractures, ask your health care provider if you should: °Be screened for bone loss. °Take a calcium or vitamin D supplement to lower your risk of fractures. °Be given hormone replacement therapy (HRT) to treat symptoms of menopause. °Follow these instructions at home: °Alcohol use °Do not drink alcohol if: °Your health care provider tells you not to drink. °You are pregnant, may be pregnant, or are planning to become pregnant. °If you drink alcohol: °Limit how much you have to: °0-1 drink a day. °Know how much alcohol is in your drink. In the [place] one drink equals one 12 oz bottle of beer (355 mL), one 5 oz glass of wine (148 mL), or one 1½ oz glass of hard liquor (44 mL). °Lifestyle °Do not use any products that contain nicotine or tobacco. These products include cigarettes, chewing tobacco, and vaping devices, such as e-cigarettes. If you need help quitting, ask your health care provider. °Do not use street drugs. °Do not share needles. °Ask your health care provider for help if you need support or information about quitting drugs. °General instructions °Schedule regular health, dental, and eye exams. °Stay current with your vaccines. °Tell your health care provider if: °You often  feel depressed. °You have ever been abused or do not feel safe at home. °Summary °Adopting a healthy lifestyle and getting preventive care are important in promoting health and wellness. °Follow your health care provider's instructions about healthy diet, exercising, and getting tested or screened for diseases. °Follow your health care provider's instructions on monitoring your cholesterol and blood pressure. °This information is not intended to replace advice given to you by your health care provider. Make sure you discuss any questions you have with your health care provider. °Document Revised: 07/17/2020 Document Reviewed: 07/17/2020 °Elsevier Patient Education © 2022 [employer] ° °

## 2021-03-29 NOTE — Progress Notes (Signed)
Subjective:    Patient ID: Yvonne Bailey, female    DOB: 1940/11/09, 81 y.o.   MRN: [id number]   This visit occurred during the SARS-CoV-2 public health emergency.  Safety protocols were in place, including screening questions prior to the visit, additional usage of staff PPE, and extensive cleaning of exam room while observing appropriate contact time as indicated for disinfecting solutions.    HPI She is here for a physical exam.     Medications and allergies reviewed with patient and updated if appropriate.  Patient Active Problem List   Diagnosis Date Noted   Hematuria 03/18/2021   Acute cystitis 10/17/2020   Rash 08/29/2020   TIA (transient ischemic attack) 06/10/2020   Anxiety and depression 03/28/2020   Overactive bladder 03/28/2020   Lack of motivation 69/67/8938   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   Aphasia as late effect of cerebrovascular accident (CVA)    Acute ischemic cerebrovascular accident (CVA) involving left middle cerebral artery [facility])    Coronary artery disease involving native coronary artery without angina pectoris    Stage 3 chronic kidney disease ([place])    Prediabetes 07/28/2016   DOE (dyspnea on exertion) 07/09/2016   Gait instability 07/09/2016   Cardiomyopathy, ischemic    Chronic systolic heart failure ([place]) 08/21/2015   Bilateral leg edema 07/19/2015   Atrial fibrillation ([place]) 07/19/2015   Urinary urgency 03/30/2015   Lacunar infarction ([place]) 12/17/2012   Small vessel disease, cerebrovascular 12/17/2012   CAROTID BRUIT, RIGHT 08/10/2008   Peripheral vascular disease ([place]) 06/04/2007   Diverticulosis of large intestine 06/04/2007   Hypothyroidism 02/24/2007   Dyslipidemia 02/24/2007   Essential hypertension 02/24/2007   Acute thromboembolism of deep veins of lower extremity ([place]) 01/21/2007    Current Outpatient Medications on File Prior to Visit  Medication Sig Dispense Refill    apixaban (ELIQUIS) 5 MG TABS tablet Take 1 tablet (5 mg total) by mouth 2 (two) times daily. 180 tablet 0   aspirin EC 81 MG tablet Take 1 tablet (81 mg total) by mouth daily. 90 tablet 3   atorvastatin (LIPITOR) 80 MG tablet TAKE 1 TABLET(80 MG) BY MOUTH DAILY 90 tablet 1   benazepril (LOTENSIN) 20 MG tablet TAKE 1 AND 1/2 TABLETS(30 MG) BY MOUTH DAILY 135 tablet 1   brimonidine (ALPHAGAN) 0.2 % ophthalmic solution Place 1 drop into both eyes 2 (two) times daily.     dorzolamide-timolol (COSOPT) 22.3-6.8 MG/ML ophthalmic solution Place 1 drop into both eyes 2 (two) times daily.      furosemide (LASIX) 20 MG tablet Take 1 tablet (20 mg total) by mouth daily as needed for edema. 15 tablet 0   levothyroxine (SYNTHROID) 88 MCG tablet TAKE 1 TABLET(88 MCG) BY MOUTH DAILY 90 tablet 1   metoprolol succinate (TOPROL-XL) 50 MG 24 hr tablet TAKE 1 TABLET(50 MG) BY MOUTH DAILY. PLEASE MAKE OVERDUE APPOINTMENT WITH DOCTOR [place] ANYMORE REFILLS. THANK YOU 2 ND ATTEMPT 15 tablet 0   MYRBETRIQ 25 MG TB24 tablet TAKE 1 TABLET(25 MG) BY MOUTH DAILY (Patient taking differently: Take 25 mg by mouth daily.) 30 tablet 5   nitroGLYCERIN (NITROSTAT) 0.4 MG SL tablet Place 1 tablet (0.4 mg total) under the tongue every 5 (five) minutes as needed for chest pain. 25 tablet 1   nystatin-triamcinolone ointment (MYCOLOG) Apply 1 application topically 2 (two) times daily. 100 g 0   polyethylene glycol (MIRALAX) 17 g packet Take one dose 3 times a day until bowel clear, maximum of  3 consecutive days. (Patient taking differently: Take 17 g by mouth daily as needed for mild constipation.) 9 each 0   sertraline (ZOLOFT) 50 MG tablet TAKE 1 TABLET(50 MG) BY MOUTH DAILY 30 tablet 5   spironolactone (ALDACTONE) 25 MG tablet TAKE 1 TABLET(25 MG) BY MOUTH DAILY 90 tablet 1   travoprost, benzalkonium, (TRAVATAN) 0.004 % ophthalmic solution Place 1 drop into both eyes at bedtime.     No current  facility-administered medications on file prior to visit.    Past Medical History:  Diagnosis Date   Bilateral leg edema 07/19/2015   CAD (coronary artery disease)    a. anterior MI s/p PCI in 1992. b. cath 08/2015 with severe three-vessel CAD turned down for CABG and underwent DESx5 to prox Cx/OM2/D1/oRCA/mRCA   Carotid artery disease ([place])    a. carotid duplex 03/2015 showed 1-39% BICA, normal subclavian arteries, chronically occluded left vertebral, f/u recommended only PRN.   DVT (deep venous thrombosis) ([place])    X1   History of nuclear stress test    a. Myoview 6/17: EF 20-25%, mid anteroseptal, apical anterior, apical septal, apical inferior, apical lateral and apical scar, no ischemia, intermediate risk   HTN (hypertension)    Hyperlipidemia    Hypokalemia    Hypothyroidism    Ischemic cardiomyopathy    a. Echo 6/17: EF 20-25%, apex appears akinetic, MAC, moderate MR, moderate LAE, mild RVE, trivial PI, PASP 47 mmHg (needs repeat with Definity contrast).  b. Limited echo with Definity contrast 7/17: EF 25-30%, moderate to severe LAE. c. Limited Echo 2018 showed EF 40-45%.   Lacunar infarction ([place]) 12/17/2012   Dr Krista Blue, Neurology    Leukocytosis    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   Longstanding persistent atrial fibrillation ([facility])    MI (myocardial infarction) ([place]) 1992   PAD (peripheral artery disease) ([facility])    Right SFA occlusion, severe disease left CFA and SFA   Prediabetes 07/28/2016   Stage 3 chronic kidney disease ([place])    Stroke ([place])    a. 02/2017 in setting of noncompliance with Eliquis   TIA (transient ischemic attack)    Tricuspid regurgitation     Past Surgical History:  Procedure Laterality Date   APPENDECTOMY     at hysterectomy and USO for fibroids, Dr. Ysidro Evert   CARDIAC CATHETERIZATION  1992   Dr Eustace Quail   CARDIAC CATHETERIZATION N/A 09/06/2015   Procedure: Right/Left Heart Cath and Coronary Angiography;  Surgeon:  Burnell Blanks, MD;  Location: [place] [facility];  Service: Cardiovascular;  Laterality: N/A;   CARDIAC CATHETERIZATION N/A 09/07/2015   Procedure: Coronary Stent Intervention;  Surgeon: Burnell Blanks, MD;  Location: [place] [facility];  Service: Cardiovascular;  Laterality: N/A;   COLONOSCOPY     negative; 2008, Dr. Delfin Edis   fracture LLE     '94; pinned   IR ANGIO INTRA EXTRACRAN SEL COM CAROTID INNOMINATE BILAT MOD SED  03/02/2017   IR ANGIO INTRA EXTRACRAN SEL COM CAROTID INNOMINATE BILAT MOD SED  04/23/2018   IR ANGIO VERTEBRAL SEL SUBCLAVIAN INNOMINATE BILAT MOD SED  04/23/2018   IR ANGIO VERTEBRAL SEL VERTEBRAL UNI R MOD SED  03/02/2017   IR RADIOLOGIST EVAL & MGMT  04/28/2017   IR US GUIDE VASC ACCESS RIGHT  04/23/2018   RADIOLOGY WITH ANESTHESIA N/A 03/02/2017   Procedure: RADIOLOGY WITH ANESTHESIA;  Surgeon: Luanne Bras, MD;  Location: [facility];  Service: Radiology;  Laterality: N/A;   TEE WITHOUT CARDIOVERSION N/A  11/02/2020   Procedure: TRANSESOPHAGEAL ECHOCARDIOGRAM (TEE);  Surgeon: Sanda Klein, MD;  Location: [facility];  Service: Cardiovascular;  Laterality: N/A;   TONSILLECTOMY     TOTAL ABDOMINAL HYSTERECTOMY     & BSO for fibroids    Social History   Socioeconomic History   Marital status: Married    Spouse name: [name]   Number of children: 1   Years of education: college   Highest education level: Not on file  Occupational History   Occupation: Pharmacist, hospital    Comment: Retired  Tobacco Use   Smoking status: Former    Types: Cigarettes    Quit date: 03/11/1990    Years since quitting: 31.1   Smokeless tobacco: Never   Tobacco comments:    smoked 1973- 1992, up to < 5 cigarettes  Vaping Use   Vaping Use: Never used  Substance and Sexual Activity   Alcohol use: No   Drug use: No   Sexual activity: Not Currently    Birth control/protection: Surgical  Other Topics Concern   Not on file  Social History  Narrative   She works as needed Oceanographer, 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 08/31/09- designated party form signed appointing, husband Lada Fulbright; ok to leave msg on home phone [phone].      Caffeine Small Amount one cup very rare.   Right handed.   One child- [name]   Social Determinants of Health   Financial Resource Strain: Not on file  Food Insecurity: Not on file  Transportation Needs: Not on file  Physical Activity: Not on file  Stress: Not on file  Social Connections: Not on file    Family History  Problem Relation Age of Onset   Diabetes Mother    Hypertension Mother    Stroke Brother        ?> 77   Coronary artery disease Brother        stent in 49s   Cancer Neg Hx     Review of Systems     Objective:  There were no vitals filed for this visit. There were no vitals filed for this visit. There is no height or weight on file to calculate BMI.  BP Readings from Last 3 Encounters:  04/10/21 126/68  02/13/21 (!) 142/75  11/30/20 122/82    Wt Readings from Last 3 Encounters:  04/10/21 161 lb (73 kg)  11/15/20 163 lb (73.9 kg)  11/02/20 163 lb 2.3 oz (74 kg)    Depression screen [facility] 2/9 12/23/2019 08/20/2019 05/20/2018 04/11/2017 02/04/2017  Decreased Interest 0 0 1 0 0  Down, Depressed, Hopeless 0 0 0 0 0  PHQ - 2 Score 0 0 1 0 0  Altered sleeping - - 0 - -  Tired, decreased energy - - 0 - -  Change in appetite - - 0 - -  Feeling bad or failure about yourself  - - 0 - -  Trouble concentrating - - 2 - -  Moving slowly or fidgety/restless - - 1 - -  Suicidal thoughts - - 0 - -  PHQ-9 Score - - 4 - -  Difficult doing work/chores - - - - -  Some recent data might be hidden     GAD 7 : Generalized Anxiety Score 05/20/2018  Nervous, Anxious, on Edge 0  Control/stop worrying 0  Worry too much - different things 1  Trouble relaxing 1  Restless 1  Easily annoyed or irritable 1  Afraid - awful might happen  1  Total GAD 7 Score 5        Physical Exam Constitutional: She appears well-developed and well-nourished. No distress.  HENT:  Head: Normocephalic and atraumatic.  Right Ear: External ear normal. Normal ear canal and TM Left Ear: External ear normal.  Normal ear canal and TM Mouth/Throat: Oropharynx is clear and moist.  Eyes: Conjunctivae and EOM are normal.  Neck: Neck supple. No tracheal deviation present. No thyromegaly present.  No carotid bruit  Cardiovascular: Normal rate, regular rhythm and normal heart sounds.   No murmur heard.  No edema. Pulmonary/Chest: Effort normal and breath sounds normal. No respiratory distress. She has no wheezes. She has no rales.  Breast: deferred   Abdominal: Soft. She exhibits no distension. There is no tenderness.  Lymphadenopathy: She has no cervical adenopathy.  Skin: Skin is warm and dry. She is not diaphoretic.  Psychiatric: She has a normal mood and affect. Her behavior is normal.     Lab Results  Component Value Date   WBC 8.7 04/10/2021   HGB 11.0 (L) 04/10/2021   HCT 33.7 (L) 04/10/2021   PLT 254.0 04/10/2021   GLUCOSE 94 04/10/2021   CHOL 100 04/10/2021   TRIG 46.0 04/10/2021   HDL 39.10 04/10/2021   LDLCALC 52 04/10/2021   ALT 20 04/10/2021   AST 29 04/10/2021   NA 141 04/10/2021   K 3.9 04/10/2021   CL 108 04/10/2021   CREATININE 1.04 04/10/2021   BUN 18 04/10/2021   CO2 28 04/10/2021   TSH 5.31 04/10/2021   INR 1.6 (H) 09/08/2020   HGBA1C 5.2 04/10/2021         Assessment & Plan:   Physical exam: Screening blood work  ordered Exercise   Weight   Substance abuse  none   Reviewed recommended immunizations.   Health Maintenance  Topic Date Due   COVID-19 Vaccine (1) 04/26/2021 (Originally 01/10/1941)   INFLUENZA VACCINE  06/08/2021 (Originally 10/09/2020)   Zoster Vaccines- Shingrix (1 of 2) 07/08/2021 (Originally 07/11/1990)   TETANUS/TDAP  04/10/2022 (Originally 07[birth year]11/1959)   DEXA SCAN  04/10/2024 (Originally 07/10/2005)    Pneumonia Vaccine 51+ Years old  Completed   HPV VACCINES  Aged Out        See Problem List for Assessment and Plan of chronic medical problems.      This encounter was created in error - please disregard.

## 2021-03-30 ENCOUNTER — Encounter: Payer: Medicare HMO | Admitting: Internal Medicine

## 2021-03-30 ENCOUNTER — Other Ambulatory Visit: Payer: Self-pay

## 2021-03-30 DIAGNOSIS — E785 Hyperlipidemia, unspecified: Secondary | ICD-10-CM

## 2021-03-30 DIAGNOSIS — E038 Other specified hypothyroidism: Secondary | ICD-10-CM

## 2021-03-30 DIAGNOSIS — I69320 Aphasia following cerebral infarction: Secondary | ICD-10-CM

## 2021-03-30 DIAGNOSIS — I10 Essential (primary) hypertension: Secondary | ICD-10-CM

## 2021-03-30 DIAGNOSIS — N3281 Overactive bladder: Secondary | ICD-10-CM

## 2021-03-30 DIAGNOSIS — R7303 Prediabetes: Secondary | ICD-10-CM

## 2021-03-30 DIAGNOSIS — I2510 Atherosclerotic heart disease of native coronary artery without angina pectoris: Secondary | ICD-10-CM

## 2021-03-30 DIAGNOSIS — Z0000 Encounter for general adult medical examination without abnormal findings: Secondary | ICD-10-CM

## 2021-03-30 DIAGNOSIS — F419 Anxiety disorder, unspecified: Secondary | ICD-10-CM

## 2021-03-30 DIAGNOSIS — I4819 Other persistent atrial fibrillation: Secondary | ICD-10-CM

## 2021-03-30 DIAGNOSIS — N1831 Chronic kidney disease, stage 3a: Secondary | ICD-10-CM

## 2021-03-30 NOTE — Assessment & Plan Note (Signed)
Chronic  Clinically euthyroid Currently taking levothyroxine 88 mcg daily Check tsh  Titrate med dose if needed  

## 2021-03-30 NOTE — Assessment & Plan Note (Signed)
Chronic Denies symptoms suggestive of angina On Eliquis, aspirin, Lipitor

## 2021-03-30 NOTE — Assessment & Plan Note (Addendum)
Chronic Following with cardiology Will check CBC, TSH, CMP

## 2021-03-30 NOTE — Assessment & Plan Note (Signed)
Chronic Controlled, Stable Continue sertraline 50 mg daily 

## 2021-03-30 NOTE — Assessment & Plan Note (Signed)
Chronic Blood pressure well controlled CMP Continue benazepril 30 mg daily, metoprolol XL 50 mg daily, spironolactone 25 mg daily

## 2021-03-30 NOTE — Assessment & Plan Note (Signed)
Chronic CMP 

## 2021-03-30 NOTE — Assessment & Plan Note (Signed)
Chronic Check a1c Low sugar / carb diet Stressed regular exercise  

## 2021-03-30 NOTE — Assessment & Plan Note (Signed)
Chronic Regular exercise and healthy diet encouraged Check lipid panel  Continue atorvastatin 80 mg daily 

## 2021-03-30 NOTE — Assessment & Plan Note (Signed)
Chronic Not ideally controlled, but probably better with medication Continue Myrbetriq 25 mg daily

## 2021-03-30 NOTE — Assessment & Plan Note (Addendum)
Chronic On aspirin, Eliquis, atorvastatin for prevention Blood pressure well controlled Stressed regular activity/exercise

## 2021-04-09 ENCOUNTER — Encounter: Payer: Self-pay | Admitting: Internal Medicine

## 2021-04-09 NOTE — Patient Instructions (Addendum)
Blood work was ordered.     Medications changes include :   none    Please followup in 6 months    Health Maintenance, Female Adopting a healthy lifestyle and getting preventive care are important in promoting health and wellness. Ask your health care provider about: The right schedule for you to have regular tests and exams. Things you can do on your own to prevent diseases and keep yourself healthy. What should I know about diet, weight, and exercise? Eat a healthy diet  Eat a diet that includes plenty of vegetables, fruits, low-fat dairy products, and lean protein. Do not eat a lot of foods that are high in solid fats, added sugars, or sodium. Maintain a healthy weight Body mass index (BMI) is used to identify weight problems. It estimates body fat based on height and weight. Your health care provider can help determine your BMI and help you achieve or maintain a healthy weight. Get regular exercise Get regular exercise. This is one of the most important things you can do for your health. Most adults should: Exercise for at least 150 minutes each week. The exercise should increase your heart rate and make you sweat (moderate-intensity exercise). Do strengthening exercises at least twice a week. This is in addition to the moderate-intensity exercise. Spend less time sitting. Even light physical activity can be beneficial. Watch cholesterol and blood lipids Have your blood tested for lipids and cholesterol at 81 years of age, then have this test every 5 years. Have your cholesterol levels checked more often if: Your lipid or cholesterol levels are high. You are older than 81 years of age. You are at high risk for heart disease. What should I know about cancer screening? Depending on your health history and family history, you may need to have cancer screening at various ages. This may include screening for: Breast cancer. Cervical cancer. Colorectal cancer. Skin cancer. Lung  cancer. What should I know about heart disease, diabetes, and high blood pressure? Blood pressure and heart disease High blood pressure causes heart disease and increases the risk of stroke. This is more likely to develop in people who have high blood pressure readings or are overweight. Have your blood pressure checked: Every 3-5 years if you are 18-39 years of age. Every year if you are 40 years old or older. Diabetes Have regular diabetes screenings. This checks your fasting blood sugar level. Have the screening done: Once every three years after age 40 if you are at a normal weight and have a low risk for diabetes. More often and at a younger age if you are overweight or have a high risk for diabetes. What should I know about preventing infection? Hepatitis B If you have a higher risk for hepatitis B, you should be screened for this virus. Talk with your health care provider to find out if you are at risk for hepatitis B infection. Hepatitis C Testing is recommended for: Everyone born from 1945 through 1965. Anyone with known risk factors for hepatitis C. Sexually transmitted infections (STIs) Get screened for STIs, including gonorrhea and chlamydia, if: You are sexually active and are younger than 81 years of age. You are older than 81 years of age and your health care provider tells you that you are at risk for this type of infection. Your sexual activity has changed since you were last screened, and you are at increased risk for chlamydia or gonorrhea. Ask your health care provider if you are at risk. Ask   your health care provider about whether you are at high risk for HIV. Your health care provider may recommend a prescription medicine to help prevent HIV infection. If you choose to take medicine to prevent HIV, you should first get tested for HIV. You should then be tested every 3 months for as long as you are taking the medicine. Pregnancy If you are about to stop having your  period (premenopausal) and you may become pregnant, seek counseling before you get pregnant. Take 400 to 800 micrograms (mcg) of folic acid every day if you become pregnant. Ask for birth control (contraception) if you want to prevent pregnancy. Osteoporosis and menopause Osteoporosis is a disease in which the bones lose minerals and strength with aging. This can result in bone fractures. If you are 65 years old or older, or if you are at risk for osteoporosis and fractures, ask your health care provider if you should: Be screened for bone loss. Take a calcium or vitamin D supplement to lower your risk of fractures. Be given hormone replacement therapy (HRT) to treat symptoms of menopause. Follow these instructions at home: Alcohol use Do not drink alcohol if: Your health care provider tells you not to drink. You are pregnant, may be pregnant, or are planning to become pregnant. If you drink alcohol: Limit how much you have to: 0-1 drink a day. Know how much alcohol is in your drink. In the [place] one drink equals one 12 oz bottle of beer (355 mL), one 5 oz glass of wine (148 mL), or one 1 oz glass of hard liquor (44 mL). Lifestyle Do not use any products that contain nicotine or tobacco. These products include cigarettes, chewing tobacco, and vaping devices, such as e-cigarettes. If you need help quitting, ask your health care provider. Do not use street drugs. Do not share needles. Ask your health care provider for help if you need support or information about quitting drugs. General instructions Schedule regular health, dental, and eye exams. Stay current with your vaccines. Tell your health care provider if: You often feel depressed. You have ever been abused or do not feel safe at home. Summary Adopting a healthy lifestyle and getting preventive care are important in promoting health and wellness. Follow your health care provider's instructions about healthy diet, exercising, and  getting tested or screened for diseases. Follow your health care provider's instructions on monitoring your cholesterol and blood pressure. This information is not intended to replace advice given to you by your health care provider. Make sure you discuss any questions you have with your health care provider. Document Revised: 07/17/2020 Document Reviewed: 07/17/2020 Elsevier Patient Education  2022 [employer]  

## 2021-04-09 NOTE — Progress Notes (Signed)
Subjective:    Patient ID: Yvonne Bailey, female    DOB: 1940-06-10, 81 y.o.   MRN: [id number]   This visit occurred during the SARS-CoV-2 public health emergency.  Safety protocols were in place, including screening questions prior to the visit, additional usage of staff PPE, and extensive cleaning of exam room while observing appropriate contact time as indicated for disinfecting solutions.    HPI She is here for a physical exam.   She is here today with her granddaughter.  Patient has no concerns.  The granddaughter states no concerns.  She is taking her medications most of the time.  Medications and allergies reviewed with patient and updated if appropriate.  Patient Active Problem List   Diagnosis Date Noted   Hematuria 03/18/2021   Acute cystitis 10/17/2020   Rash 08/29/2020   TIA (transient ischemic attack) 06/10/2020   Anxiety and depression 03/28/2020   Overactive bladder 03/28/2020   Lack of motivation 32/20/2542   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   Aphasia as late effect of cerebrovascular accident (CVA)    Acute ischemic cerebrovascular accident (CVA) involving left middle cerebral artery [facility])    Coronary artery disease involving native coronary artery without angina pectoris    Stage 3 chronic kidney disease ([place])    Prediabetes 07/28/2016   DOE (dyspnea on exertion) 07/09/2016   Gait instability 07/09/2016   Cardiomyopathy, ischemic    Chronic systolic heart failure ([place]) 08/21/2015   Bilateral leg edema 07/19/2015   Atrial fibrillation ([place]) 07/19/2015   Urinary urgency 03/30/2015   Lacunar infarction ([place]) 12/17/2012   Small vessel disease, cerebrovascular 12/17/2012   CAROTID BRUIT, RIGHT 08/10/2008   Peripheral vascular disease ([place]) 06/04/2007   Diverticulosis of large intestine 06/04/2007   Hypothyroidism 02/24/2007   Dyslipidemia 02/24/2007   Essential hypertension 02/24/2007   Acute thromboembolism of deep veins of lower  extremity ([place]) 01/21/2007    Current Outpatient Medications on File Prior to Visit  Medication Sig Dispense Refill   apixaban (ELIQUIS) 5 MG TABS tablet Take 1 tablet (5 mg total) by mouth 2 (two) times daily. 180 tablet 0   aspirin EC 81 MG tablet Take 1 tablet (81 mg total) by mouth daily. 90 tablet 3   atorvastatin (LIPITOR) 80 MG tablet TAKE 1 TABLET(80 MG) BY MOUTH DAILY 90 tablet 1   benazepril (LOTENSIN) 20 MG tablet TAKE 1 AND 1/2 TABLETS(30 MG) BY MOUTH DAILY 135 tablet 1   brimonidine (ALPHAGAN) 0.2 % ophthalmic solution Place 1 drop into both eyes 2 (two) times daily.     dorzolamide-timolol (COSOPT) 22.3-6.8 MG/ML ophthalmic solution Place 1 drop into both eyes 2 (two) times daily.      estradiol (ESTRACE) 0.1 MG/GM vaginal cream Place vaginally.     furosemide (LASIX) 20 MG tablet Take 1 tablet (20 mg total) by mouth daily as needed for edema. 15 tablet 0   levothyroxine (SYNTHROID) 88 MCG tablet TAKE 1 TABLET(88 MCG) BY MOUTH DAILY 90 tablet 1   metoprolol succinate (TOPROL-XL) 50 MG 24 hr tablet TAKE 1 TABLET(50 MG) BY MOUTH DAILY. PLEASE MAKE OVERDUE APPOINTMENT WITH DOCTOR Simla ANYMORE REFILLS. THANK YOU 2 ND ATTEMPT 15 tablet 0   MYRBETRIQ 25 MG TB24 tablet TAKE 1 TABLET(25 MG) BY MOUTH DAILY (Patient taking differently: Take 25 mg by mouth daily.) 30 tablet 5   nitroGLYCERIN (NITROSTAT) 0.4 MG SL tablet Place 1 tablet (0.4 mg total) under the tongue every 5 (five) minutes as needed for chest pain.  25 tablet 1   nystatin-triamcinolone ointment (MYCOLOG) Apply 1 application topically 2 (two) times daily. 100 g 0   polyethylene glycol (MIRALAX) 17 g packet Take one dose 3 times a day until bowel clear, maximum of 3 consecutive days. (Patient taking differently: Take 17 g by mouth daily as needed for mild constipation.) 9 each 0   sertraline (ZOLOFT) 50 MG tablet TAKE 1 TABLET(50 MG) BY MOUTH DAILY 30 tablet 5   spironolactone (ALDACTONE) 25 MG tablet TAKE 1 TABLET(25  MG) BY MOUTH DAILY 90 tablet 1   travoprost, benzalkonium, (TRAVATAN) 0.004 % ophthalmic solution Place 1 drop into both eyes at bedtime.     No current facility-administered medications on file prior to visit.    Past Medical History:  Diagnosis Date   Bilateral leg edema 07/19/2015   CAD (coronary artery disease)    a. anterior MI s/p PCI in 1992. b. cath 08/2015 with severe three-vessel CAD turned down for CABG and underwent DESx5 to prox Cx/OM2/D1/oRCA/mRCA   Carotid artery disease ([place])    a. carotid duplex 03/2015 showed 1-39% BICA, normal subclavian arteries, chronically occluded left vertebral, f/u recommended only PRN.   DVT (deep venous thrombosis) ([place])    X1   History of nuclear stress test    a. Myoview 6/17: EF 20-25%, mid anteroseptal, apical anterior, apical septal, apical inferior, apical lateral and apical scar, no ischemia, intermediate risk   HTN (hypertension)    Hyperlipidemia    Hypokalemia    Hypothyroidism    Ischemic cardiomyopathy    a. Echo 6/17: EF 20-25%, apex appears akinetic, MAC, moderate MR, moderate LAE, mild RVE, trivial PI, PASP 47 mmHg (needs repeat with Definity contrast).  b. Limited echo with Definity contrast 7/17: EF 25-30%, moderate to severe LAE. c. Limited Echo 2018 showed EF 40-45%.   Lacunar infarction ([place]) 12/17/2012   Dr Krista Blue, Neurology    Leukocytosis    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   Longstanding persistent atrial fibrillation ([facility])    MI (myocardial infarction) ([place]) 1992   PAD (peripheral artery disease) (H[facility])    Right SFA occlusion, severe disease left CFA and SFA   Prediabetes 07/28/2016   Stage 3 chronic kidney disease ([place])    Stroke ([place])    a. 02/2017 in setting of noncompliance with Eliquis   TIA (transient ischemic attack)    Tricuspid regurgitation     Past Surgical History:  Procedure Laterality Date   APPENDECTOMY     at hysterectomy and USO for fibroids, Dr. Ysidro Evert   CARDIAC CATHETERIZATION  1992   Dr  Eustace Quail   CARDIAC CATHETERIZATION N/A 09/06/2015   Procedure: Right/Left Heart Cath and Coronary Angiography;  Surgeon: Burnell Blanks, MD;  Location: [place] [facility];  Service: Cardiovascular;  Laterality: N/A;   CARDIAC CATHETERIZATION N/A 09/07/2015   Procedure: Coronary Stent Intervention;  Surgeon: Burnell Blanks, MD;  Location: [place] [facility];  Service: Cardiovascular;  Laterality: N/A;   COLONOSCOPY     negative; 2008, Dr. Delfin Edis   fracture LLE     '94; pinned   IR ANGIO INTRA EXTRACRAN SEL COM CAROTID INNOMINATE BILAT MOD SED  03/02/2017   IR ANGIO INTRA EXTRACRAN SEL COM CAROTID INNOMINATE BILAT MOD SED  04/23/2018   IR ANGIO VERTEBRAL SEL SUBCLAVIAN INNOMINATE BILAT MOD SED  04/23/2018   IR ANGIO VERTEBRAL SEL VERTEBRAL UNI R MOD SED  03/02/2017   IR RADIOLOGIST EVAL & MGMT  04/28/2017   IR US GUIDE  VASC ACCESS RIGHT  04/23/2018   RADIOLOGY WITH ANESTHESIA N/A 03/02/2017   Procedure: RADIOLOGY WITH ANESTHESIA;  Surgeon: Luanne Bras, MD;  Location: [facility];  Service: Radiology;  Laterality: N/A;   TEE WITHOUT CARDIOVERSION N/A 11/02/2020   Procedure: TRANSESOPHAGEAL ECHOCARDIOGRAM (TEE);  Surgeon: Sanda Klein, MD;  Location: [facility];  Service: Cardiovascular;  Laterality: N/A;   TONSILLECTOMY     TOTAL ABDOMINAL HYSTERECTOMY     & BSO for fibroids    Social History   Socioeconomic History   Marital status: Married    Spouse name: [name]   Number of children: 1   Years of education: college   Highest education level: Not on file  Occupational History   Occupation: Pharmacist, hospital    Comment: Retired  Tobacco Use   Smoking status: Former    Types: Cigarettes    Quit date: 03/11/1990    Years since quitting: 31.1   Smokeless tobacco: Never   Tobacco comments:    smoked 1973- 1992, up to < 5 cigarettes  Vaping Use   Vaping Use: Never used  Substance and Sexual Activity   Alcohol use: No   Drug use: No   Sexual activity: Not Currently     Birth control/protection: Surgical  Other Topics Concern   Not on file  Social History Narrative   She works as needed Oceanographer, 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 08/31/09- designated party form signed appointing, husband Sami Roes; ok to leave msg on home phone [phone].      Caffeine Small Amount one cup very rare.   Right handed.   One child- [name]   Social Determinants of Health   Financial Resource Strain: Not on file  Food Insecurity: Not on file  Transportation Needs: Not on file  Physical Activity: Not on file  Stress: Not on file  Social Connections: Not on file    Family History  Problem Relation Age of Onset   Diabetes Mother    Hypertension Mother    Stroke Brother        ?> 80   Coronary artery disease Brother        stent in 66s   Cancer Neg Hx     Review of Systems  Constitutional:  Negative for appetite change, chills and fever.  Eyes:  Positive for visual disturbance (poor vision in left eye - glaucoma).  Respiratory:  Positive for cough (occ, dry cough). Negative for shortness of breath and wheezing.   Cardiovascular:  Positive for leg swelling. Negative for chest pain and palpitations.  Gastrointestinal:  Negative for abdominal pain, blood in stool, constipation, diarrhea and nausea.       No gerd  Genitourinary:  Negative for dysuria and hematuria.  Musculoskeletal:  Negative for arthralgias and back pain.  Skin:  Negative for rash.  Neurological:  Negative for dizziness, light-headedness and headaches.  Psychiatric/Behavioral:  Negative for dysphoric mood and sleep disturbance. The patient is not nervous/anxious.       Objective:   Vitals:   04/10/21 1014  BP: 126/68  Pulse: 93  Temp: 98.2 F (36.8 C)  SpO2: 97%   Filed Weights   04/10/21 1014  Weight: 161 lb (73 kg)   Body mass index is 25.22 kg/m.  BP Readings from Last 3 Encounters:  04/10/21 126/68  02/13/21 (!) 142/75  11/30/20 122/82    Wt Readings  from Last 3 Encounters:  04/10/21 161 lb (73 kg)  11/15/20 163 lb (73.9 kg)  11/02/20 163  lb 2.3 oz (74 kg)    Depression screen [facility] 2/9 12/23/2019 08/20/2019 05/20/2018 04/11/2017 02/04/2017  Decreased Interest 0 0 1 0 0  Down, Depressed, Hopeless 0 0 0 0 0  PHQ - 2 Score 0 0 1 0 0  Altered sleeping - - 0 - -  Tired, decreased energy - - 0 - -  Change in appetite - - 0 - -  Feeling bad or failure about yourself  - - 0 - -  Trouble concentrating - - 2 - -  Moving slowly or fidgety/restless - - 1 - -  Suicidal thoughts - - 0 - -  PHQ-9 Score - - 4 - -  Difficult doing work/chores - - - - -  Some recent data might be hidden     GAD 7 : Generalized Anxiety Score 05/20/2018  Nervous, Anxious, on Edge 0  Control/stop worrying 0  Worry too much - different things 1  Trouble relaxing 1  Restless 1  Easily annoyed or irritable 1  Afraid - awful might happen 1  Total GAD 7 Score 5       Physical Exam Constitutional: She appears well-developed and well-nourished. No distress.  HENT:  Head: Normocephalic and atraumatic.  Right Ear: External ear normal. Normal ear canal and TM Left Ear: External ear normal.  Normal ear canal and TM Mouth/Throat: Oropharynx is clear and moist.  Eyes: Conjunctivae and EOM are normal.  Neck: Neck supple. No tracheal deviation present. No thyromegaly present.  No carotid bruit  Cardiovascular: Normal rate, regular rhythm and normal heart sounds.   No murmur heard.  No edema. Pulmonary/Chest: Effort normal and breath sounds normal. No respiratory distress. She has no wheezes. She has no rales.  Breast: deferred   Abdominal: Soft. She exhibits no distension. There is no tenderness.  Lymphadenopathy: She has no cervical adenopathy.  Skin: Skin is warm and dry. She is not diaphoretic.  Psychiatric: She has a normal mood and affect. Her behavior is normal.     Lab Results  Component Value Date   WBC 10.2 02/12/2021   HGB 13.0 02/12/2021   HCT 41.5  02/12/2021   PLT 278 02/12/2021   GLUCOSE 169 (H) 02/12/2021   CHOL 117 03/28/2020   TRIG 60.0 03/28/2020   HDL 39.30 03/28/2020   LDLCALC 65 03/28/2020   ALT 39 02/12/2021   AST 43 (H) 02/12/2021   NA 137 02/12/2021   K 4.4 02/12/2021   CL 104 02/12/2021   CREATININE 1.35 (H) 02/12/2021   BUN 20 02/12/2021   CO2 25 02/12/2021   TSH 0.86 03/28/2020   INR 1.6 (H) 09/08/2020   HGBA1C 5.4 03/28/2020         Assessment & Plan:   Physical exam: Screening blood work  ordered Exercise  some at senior center [employer]  good for age Substance abuse  none   Reviewed recommended immunizations.   Health Maintenance  Topic Date Due   COVID-19 Vaccine (1) 04/26/2021 (Originally 01/10/1941)   INFLUENZA VACCINE  06/08/2021 (Originally 10/09/2020)   Zoster Vaccines- Shingrix (1 of 2) 07/08/2021 (Originally 07/11/1990)   TETANUS/TDAP  04/10/2022 (Originally [birth year]1959)   DEXA SCAN  04/10/2024 (Originally 07/10/2005)   Pneumonia Vaccine 70+ Years old  Completed   HPV VACCINES  Aged Out        See Problem List for Assessment and Plan of chronic medical problems.

## 2021-04-10 ENCOUNTER — Ambulatory Visit (INDEPENDENT_AMBULATORY_CARE_PROVIDER_SITE_OTHER): Payer: Medicare HMO | Admitting: Internal Medicine

## 2021-04-10 ENCOUNTER — Telehealth: Payer: Self-pay | Admitting: Internal Medicine

## 2021-04-10 ENCOUNTER — Other Ambulatory Visit: Payer: Self-pay

## 2021-04-10 VITALS — BP 126/68 | HR 93 | Temp 98.2°F | Ht 67.0 in | Wt 161.0 lb

## 2021-04-10 DIAGNOSIS — Z0000 Encounter for general adult medical examination without abnormal findings: Secondary | ICD-10-CM

## 2021-04-10 DIAGNOSIS — R7303 Prediabetes: Secondary | ICD-10-CM | POA: Diagnosis not present

## 2021-04-10 DIAGNOSIS — I4819 Other persistent atrial fibrillation: Secondary | ICD-10-CM

## 2021-04-10 DIAGNOSIS — I69320 Aphasia following cerebral infarction: Secondary | ICD-10-CM

## 2021-04-10 DIAGNOSIS — N1831 Chronic kidney disease, stage 3a: Secondary | ICD-10-CM | POA: Diagnosis not present

## 2021-04-10 DIAGNOSIS — E038 Other specified hypothyroidism: Secondary | ICD-10-CM | POA: Diagnosis not present

## 2021-04-10 DIAGNOSIS — E785 Hyperlipidemia, unspecified: Secondary | ICD-10-CM

## 2021-04-10 DIAGNOSIS — F419 Anxiety disorder, unspecified: Secondary | ICD-10-CM | POA: Diagnosis not present

## 2021-04-10 DIAGNOSIS — I10 Essential (primary) hypertension: Secondary | ICD-10-CM

## 2021-04-10 DIAGNOSIS — F32A Depression, unspecified: Secondary | ICD-10-CM

## 2021-04-10 LAB — CBC WITH DIFFERENTIAL/PLATELET
Basophils Absolute: 0.1 10*3/uL (ref 0.0–0.1)
Basophils Relative: 0.8 % (ref 0.0–3.0)
Eosinophils Absolute: 0.3 10*3/uL (ref 0.0–0.7)
Eosinophils Relative: 3.5 % (ref 0.0–5.0)
HCT: 33.7 % — ABNORMAL LOW (ref 36.0–46.0)
Hemoglobin: 11 g/dL — ABNORMAL LOW (ref 12.0–15.0)
Lymphocytes Relative: 16.6 % (ref 12.0–46.0)
Lymphs Abs: 1.4 10*3/uL (ref 0.7–4.0)
MCHC: 32.6 g/dL (ref 30.0–36.0)
MCV: 97.8 fl (ref 78.0–100.0)
Monocytes Absolute: 0.7 10*3/uL (ref 0.1–1.0)
Monocytes Relative: 7.7 % (ref 3.0–12.0)
Neutro Abs: 6.2 10*3/uL (ref 1.4–7.7)
Neutrophils Relative %: 71.4 % (ref 43.0–77.0)
Platelets: 254 10*3/uL (ref 150.0–400.0)
RBC: 3.45 Mil/uL — ABNORMAL LOW (ref 3.87–5.11)
RDW: 13.8 % (ref 11.5–15.5)
WBC: 8.7 10*3/uL (ref 4.0–10.5)

## 2021-04-10 LAB — COMPREHENSIVE METABOLIC PANEL
ALT: 20 U/L (ref 0–35)
AST: 29 U/L (ref 0–37)
Albumin: 3.8 g/dL (ref 3.5–5.2)
Alkaline Phosphatase: 86 U/L (ref 39–117)
BUN: 18 mg/dL (ref 6–23)
CO2: 28 mEq/L (ref 19–32)
Calcium: 8.7 mg/dL (ref 8.4–10.5)
Chloride: 108 mEq/L (ref 96–112)
Creatinine, Ser: 1.04 mg/dL (ref 0.40–1.20)
GFR: 50.68 mL/min — ABNORMAL LOW (ref >60.00)
Glucose, Bld: 94 mg/dL (ref 70–99)
Potassium: 3.9 mEq/L (ref 3.5–5.1)
Sodium: 141 mEq/L (ref 135–145)
Total Bilirubin: 0.8 mg/dL (ref 0.2–1.2)
Total Protein: 6.7 g/dL (ref 6.0–8.3)

## 2021-04-10 LAB — TSH: TSH: 5.31 u[IU]/mL (ref 0.35–5.50)

## 2021-04-10 LAB — HEMOGLOBIN A1C: Hgb A1c MFr Bld: 5.2 % (ref 4.6–6.5)

## 2021-04-10 LAB — LIPID PANEL
Cholesterol: 100 mg/dL (ref 0–200)
HDL: 39.1 mg/dL (ref >39.00)
LDL Cholesterol: 52 mg/dL (ref 0–99)
NonHDL: 60.85
Total CHOL/HDL Ratio: 3
Triglycerides: 46 mg/dL (ref 0.0–149.0)
VLDL: 9.2 mg/dL (ref 0.0–40.0)

## 2021-04-10 NOTE — Assessment & Plan Note (Signed)
Chronic Check a1c Low sugar / carb diet Stressed regular exercise  

## 2021-04-10 NOTE — Assessment & Plan Note (Signed)
Chronic BP well controlled Continue metoprolol XL 50 mg daily, benazepril 30 mg daily, spironolactone 25 mg daily cmp

## 2021-04-10 NOTE — Telephone Encounter (Signed)
Patient son [name] calling in  Says he gave some physical & med forms to [place] at front desk for provider to fill out last Thursday 01.26.23 & wants to know if those forms have been completed  Please fu once confirmed [phone]

## 2021-04-10 NOTE — Assessment & Plan Note (Signed)
Chronic Following with cardiology On eliquis 5 mg bid, metoprolol xl 50 mg daily

## 2021-04-10 NOTE — Assessment & Plan Note (Signed)
Chronic Blood pressure well controlled Continue aspirin 81 mg daily, Eliquis 5 mg twice daily and atorvastatin 80 mg daily Stressed importance of taking her medication as prescribed

## 2021-04-10 NOTE — Assessment & Plan Note (Signed)
Chronic Regular exercise and healthy diet encouraged Check lipid panel  Continue atorvastatin 80 mg daily 

## 2021-04-10 NOTE — Assessment & Plan Note (Signed)
Chronic  Clinically euthyroid Currently taking levothyroxine 88 mcg daily Check tsh  Titrate med dose if needed  

## 2021-04-10 NOTE — Assessment & Plan Note (Signed)
Chronic Controlled, Stable-she denies any depression or anxiety and family has no concerns Continue sertraline 50 mg daily

## 2021-04-10 NOTE — Assessment & Plan Note (Signed)
Chronic CMP Blood pressure well controlled 

## 2021-04-11 NOTE — Telephone Encounter (Signed)
Spoke with patient's son today.  Forms left upfront for pickup

## 2021-04-26 ENCOUNTER — Other Ambulatory Visit: Payer: Self-pay | Admitting: Internal Medicine

## 2021-05-23 ENCOUNTER — Telehealth: Payer: Self-pay | Admitting: Internal Medicine

## 2021-05-23 DIAGNOSIS — I69320 Aphasia following cerebral infarction: Secondary | ICD-10-CM

## 2021-05-23 DIAGNOSIS — I5022 Chronic systolic (congestive) heart failure: Secondary | ICD-10-CM

## 2021-05-23 DIAGNOSIS — F32A Depression, unspecified: Secondary | ICD-10-CM

## 2021-05-23 NOTE — Telephone Encounter (Signed)
Patient son Yvonne Bailey calling in ? ?Says he is needing to speak w/ nurse on how to go about possibly placing patient in assistant living facility.. says patient is no longer wanting to do daily activities such as showering & he doesn't know what needs to be done ? ?Please fu [phone] ?

## 2021-05-23 NOTE — Telephone Encounter (Signed)
I can get a Education officer, museum involved and she can call him to discuss this. ? ?Chronic care management-social worker ordered ?

## 2021-05-24 MED ORDER — SERTRALINE HCL 100 MG PO TABS: 100.0000 mg | ORAL_TABLET | Freq: Every day | ORAL | 3 refills | Status: DC

## 2021-05-24 NOTE — Addendum Note (Signed)
Addended by: Pincus Sanes on: 05/24/2021 03:07 PM ? ? Modules accepted: Orders ? ?

## 2021-05-24 NOTE — Telephone Encounter (Signed)
Can increase sertraline to 100 mg daily - new rx sent to [facility] ?

## 2021-05-25 NOTE — Telephone Encounter (Signed)
Spoke with patient's son today. 

## 2021-06-06 ENCOUNTER — Other Ambulatory Visit: Payer: Self-pay | Admitting: Internal Medicine

## 2021-06-22 ENCOUNTER — Other Ambulatory Visit: Payer: Self-pay

## 2021-06-22 ENCOUNTER — Telehealth: Payer: Self-pay | Admitting: Internal Medicine

## 2021-06-22 DIAGNOSIS — I739 Peripheral vascular disease, unspecified: Secondary | ICD-10-CM

## 2021-06-22 MED ORDER — APIXABAN 5 MG PO TABS: 5.0000 mg | ORAL_TABLET | Freq: Two times a day (BID) | ORAL | 0 refills | Status: DC

## 2021-06-22 NOTE — Telephone Encounter (Signed)
Sent in today 

## 2021-06-22 NOTE — Telephone Encounter (Signed)
1.Medication Requested: apixaban (ELIQUIS) 5 MG TABS tablet ? ?2. Pharmacy (Name, [place]): [facility] [phone] - [place], [address] ? ?3. On Med List: Y  ? ?4. Last Visit with PCP: 04-10-2021 ? ?5. Next visit date with PCP: 10-10-2021 ? ? ?Agent: Please be advised that RX refills may take up to 3 business days. We ask that you follow-up with your pharmacy.  ?

## 2021-07-05 ENCOUNTER — Other Ambulatory Visit: Payer: Self-pay

## 2021-07-05 DIAGNOSIS — I10 Essential (primary) hypertension: Secondary | ICD-10-CM

## 2021-07-05 MED ORDER — METOPROLOL SUCCINATE ER 50 MG PO TB24: 50.0000 mg | ORAL_TABLET | Freq: Every day | ORAL | 0 refills | Status: DC

## 2021-07-30 ENCOUNTER — Other Ambulatory Visit: Payer: Self-pay | Admitting: Internal Medicine

## 2021-08-05 ENCOUNTER — Other Ambulatory Visit: Payer: Self-pay | Admitting: Internal Medicine

## 2021-08-13 ENCOUNTER — Other Ambulatory Visit: Payer: Self-pay | Admitting: Cardiovascular Disease

## 2021-08-13 DIAGNOSIS — I10 Essential (primary) hypertension: Secondary | ICD-10-CM

## 2021-08-21 DIAGNOSIS — N3941 Urge incontinence: Secondary | ICD-10-CM | POA: Diagnosis not present

## 2021-08-21 DIAGNOSIS — R8271 Bacteriuria: Secondary | ICD-10-CM | POA: Diagnosis not present

## 2021-08-21 DIAGNOSIS — N200 Calculus of kidney: Secondary | ICD-10-CM | POA: Diagnosis not present

## 2021-09-02 ENCOUNTER — Encounter (HOSPITAL_COMMUNITY): Payer: Self-pay | Admitting: Emergency Medicine

## 2021-09-02 ENCOUNTER — Inpatient Hospital Stay (HOSPITAL_COMMUNITY)
Admission: EM | Admit: 2021-09-02 | Discharge: 2021-09-05 | DRG: 698 | Disposition: A | Discharge status: Skilled Nursing Facility | Payer: Medicare HMO | Attending: Internal Medicine | Admitting: Internal Medicine

## 2021-09-02 ENCOUNTER — Other Ambulatory Visit: Payer: Self-pay

## 2021-09-02 ENCOUNTER — Emergency Department (HOSPITAL_COMMUNITY): Payer: Medicare HMO

## 2021-09-02 DIAGNOSIS — G9341 Metabolic encephalopathy: Secondary | ICD-10-CM | POA: Diagnosis not present

## 2021-09-02 DIAGNOSIS — B964 Proteus (mirabilis) (morganii) as the cause of diseases classified elsewhere: Secondary | ICD-10-CM | POA: Diagnosis not present

## 2021-09-02 DIAGNOSIS — E785 Hyperlipidemia, unspecified: Secondary | ICD-10-CM | POA: Diagnosis present

## 2021-09-02 DIAGNOSIS — I130 Hypertensive heart and chronic kidney disease with heart failure and stage 1 through stage 4 chronic kidney disease, or unspecified chronic kidney disease: Secondary | ICD-10-CM | POA: Diagnosis present

## 2021-09-02 DIAGNOSIS — N189 Chronic kidney disease, unspecified: Secondary | ICD-10-CM | POA: Diagnosis not present

## 2021-09-02 DIAGNOSIS — Z86718 Personal history of other venous thrombosis and embolism: Secondary | ICD-10-CM | POA: Diagnosis not present

## 2021-09-02 DIAGNOSIS — Z823 Family history of stroke: Secondary | ICD-10-CM

## 2021-09-02 DIAGNOSIS — Z8673 Personal history of transient ischemic attack (TIA), and cerebral infarction without residual deficits: Secondary | ICD-10-CM

## 2021-09-02 DIAGNOSIS — I4811 Longstanding persistent atrial fibrillation: Secondary | ICD-10-CM | POA: Diagnosis present

## 2021-09-02 DIAGNOSIS — T83511D Infection and inflammatory reaction due to indwelling urethral catheter, subsequent encounter: Secondary | ICD-10-CM | POA: Diagnosis not present

## 2021-09-02 DIAGNOSIS — T83511A Infection and inflammatory reaction due to indwelling urethral catheter, initial encounter: Secondary | ICD-10-CM | POA: Diagnosis not present

## 2021-09-02 DIAGNOSIS — Z79899 Other long term (current) drug therapy: Secondary | ICD-10-CM | POA: Diagnosis not present

## 2021-09-02 DIAGNOSIS — F03A0 Unspecified dementia, mild, without behavioral disturbance, psychotic disturbance, mood disturbance, and anxiety: Secondary | ICD-10-CM | POA: Diagnosis present

## 2021-09-02 DIAGNOSIS — N1831 Chronic kidney disease, stage 3a: Secondary | ICD-10-CM | POA: Diagnosis present

## 2021-09-02 DIAGNOSIS — N390 Urinary tract infection, site not specified: Secondary | ICD-10-CM | POA: Diagnosis present

## 2021-09-02 DIAGNOSIS — Z7901 Long term (current) use of anticoagulants: Secondary | ICD-10-CM

## 2021-09-02 DIAGNOSIS — A419 Sepsis, unspecified organism: Secondary | ICD-10-CM

## 2021-09-02 DIAGNOSIS — I2510 Atherosclerotic heart disease of native coronary artery without angina pectoris: Secondary | ICD-10-CM | POA: Diagnosis present

## 2021-09-02 DIAGNOSIS — R339 Retention of urine, unspecified: Secondary | ICD-10-CM | POA: Diagnosis present

## 2021-09-02 DIAGNOSIS — I5022 Chronic systolic (congestive) heart failure: Secondary | ICD-10-CM | POA: Diagnosis present

## 2021-09-02 DIAGNOSIS — I4891 Unspecified atrial fibrillation: Secondary | ICD-10-CM | POA: Diagnosis present

## 2021-09-02 DIAGNOSIS — R410 Disorientation, unspecified: Secondary | ICD-10-CM | POA: Diagnosis not present

## 2021-09-02 DIAGNOSIS — I252 Old myocardial infarction: Secondary | ICD-10-CM | POA: Diagnosis not present

## 2021-09-02 DIAGNOSIS — I428 Other cardiomyopathies: Secondary | ICD-10-CM | POA: Diagnosis not present

## 2021-09-02 DIAGNOSIS — Z79890 Hormone replacement therapy (postmenopausal): Secondary | ICD-10-CM

## 2021-09-02 DIAGNOSIS — R6520 Severe sepsis without septic shock: Secondary | ICD-10-CM | POA: Diagnosis not present

## 2021-09-02 DIAGNOSIS — E860 Dehydration: Secondary | ICD-10-CM | POA: Diagnosis present

## 2021-09-02 DIAGNOSIS — N3001 Acute cystitis with hematuria: Principal | ICD-10-CM

## 2021-09-02 DIAGNOSIS — Y846 Urinary catheterization as the cause of abnormal reaction of the patient, or of later complication, without mention of misadventure at the time of the procedure: Secondary | ICD-10-CM | POA: Diagnosis present

## 2021-09-02 DIAGNOSIS — I255 Ischemic cardiomyopathy: Secondary | ICD-10-CM | POA: Diagnosis present

## 2021-09-02 DIAGNOSIS — Z955 Presence of coronary angioplasty implant and graft: Secondary | ICD-10-CM | POA: Diagnosis not present

## 2021-09-02 DIAGNOSIS — I10 Essential (primary) hypertension: Secondary | ICD-10-CM | POA: Diagnosis present

## 2021-09-02 DIAGNOSIS — N179 Acute kidney failure, unspecified: Secondary | ICD-10-CM | POA: Diagnosis present

## 2021-09-02 DIAGNOSIS — E039 Hypothyroidism, unspecified: Secondary | ICD-10-CM | POA: Diagnosis present

## 2021-09-02 DIAGNOSIS — A4159 Other Gram-negative sepsis: Secondary | ICD-10-CM | POA: Diagnosis present

## 2021-09-02 DIAGNOSIS — R41841 Cognitive communication deficit: Secondary | ICD-10-CM | POA: Diagnosis not present

## 2021-09-02 DIAGNOSIS — R4182 Altered mental status, unspecified: Secondary | ICD-10-CM | POA: Diagnosis not present

## 2021-09-02 DIAGNOSIS — M6281 Muscle weakness (generalized): Secondary | ICD-10-CM | POA: Diagnosis not present

## 2021-09-02 DIAGNOSIS — E872 Acidosis, unspecified: Secondary | ICD-10-CM | POA: Diagnosis not present

## 2021-09-02 DIAGNOSIS — Z87891 Personal history of nicotine dependence: Secondary | ICD-10-CM

## 2021-09-02 DIAGNOSIS — Z833 Family history of diabetes mellitus: Secondary | ICD-10-CM

## 2021-09-02 DIAGNOSIS — M2550 Pain in unspecified joint: Secondary | ICD-10-CM | POA: Diagnosis not present

## 2021-09-02 DIAGNOSIS — R2689 Other abnormalities of gait and mobility: Secondary | ICD-10-CM | POA: Diagnosis not present

## 2021-09-02 DIAGNOSIS — Z7401 Bed confinement status: Secondary | ICD-10-CM | POA: Diagnosis not present

## 2021-09-02 DIAGNOSIS — Z8249 Family history of ischemic heart disease and other diseases of the circulatory system: Secondary | ICD-10-CM

## 2021-09-02 NOTE — ED Triage Notes (Signed)
Patient arrived via [facility] with complaints of altered mental status onset 24-48hr. 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 Chronic foley catheter in place with some bloody in foley catheter and complaints of pain with urination. Per EMS BP-180/70, SpO2-97% on room air, RR-30, CBG-90.

## 2021-09-03 ENCOUNTER — Emergency Department (HOSPITAL_COMMUNITY): Payer: Medicare HMO

## 2021-09-03 DIAGNOSIS — I4811 Longstanding persistent atrial fibrillation: Secondary | ICD-10-CM | POA: Diagnosis present

## 2021-09-03 DIAGNOSIS — T83511A Infection and inflammatory reaction due to indwelling urethral catheter, initial encounter: Secondary | ICD-10-CM | POA: Diagnosis present

## 2021-09-03 DIAGNOSIS — I255 Ischemic cardiomyopathy: Secondary | ICD-10-CM | POA: Diagnosis present

## 2021-09-03 DIAGNOSIS — N390 Urinary tract infection, site not specified: Secondary | ICD-10-CM | POA: Diagnosis present

## 2021-09-03 DIAGNOSIS — A419 Sepsis, unspecified organism: Secondary | ICD-10-CM

## 2021-09-03 DIAGNOSIS — I2510 Atherosclerotic heart disease of native coronary artery without angina pectoris: Secondary | ICD-10-CM | POA: Diagnosis present

## 2021-09-03 DIAGNOSIS — E860 Dehydration: Secondary | ICD-10-CM | POA: Diagnosis present

## 2021-09-03 DIAGNOSIS — N1831 Chronic kidney disease, stage 3a: Secondary | ICD-10-CM | POA: Diagnosis present

## 2021-09-03 DIAGNOSIS — I130 Hypertensive heart and chronic kidney disease with heart failure and stage 1 through stage 4 chronic kidney disease, or unspecified chronic kidney disease: Secondary | ICD-10-CM | POA: Diagnosis present

## 2021-09-03 DIAGNOSIS — G9341 Metabolic encephalopathy: Secondary | ICD-10-CM

## 2021-09-03 DIAGNOSIS — I252 Old myocardial infarction: Secondary | ICD-10-CM | POA: Diagnosis not present

## 2021-09-03 DIAGNOSIS — A4159 Other Gram-negative sepsis: Secondary | ICD-10-CM | POA: Diagnosis present

## 2021-09-03 DIAGNOSIS — F03A0 Unspecified dementia, mild, without behavioral disturbance, psychotic disturbance, mood disturbance, and anxiety: Secondary | ICD-10-CM | POA: Diagnosis present

## 2021-09-03 DIAGNOSIS — E039 Hypothyroidism, unspecified: Secondary | ICD-10-CM | POA: Diagnosis present

## 2021-09-03 DIAGNOSIS — Z8673 Personal history of transient ischemic attack (TIA), and cerebral infarction without residual deficits: Secondary | ICD-10-CM | POA: Diagnosis not present

## 2021-09-03 DIAGNOSIS — E785 Hyperlipidemia, unspecified: Secondary | ICD-10-CM | POA: Diagnosis present

## 2021-09-03 DIAGNOSIS — R6520 Severe sepsis without septic shock: Secondary | ICD-10-CM

## 2021-09-03 DIAGNOSIS — Z86718 Personal history of other venous thrombosis and embolism: Secondary | ICD-10-CM | POA: Diagnosis not present

## 2021-09-03 DIAGNOSIS — I428 Other cardiomyopathies: Secondary | ICD-10-CM | POA: Diagnosis present

## 2021-09-03 DIAGNOSIS — I5022 Chronic systolic (congestive) heart failure: Secondary | ICD-10-CM | POA: Diagnosis present

## 2021-09-03 DIAGNOSIS — R339 Retention of urine, unspecified: Secondary | ICD-10-CM | POA: Diagnosis present

## 2021-09-03 DIAGNOSIS — Z955 Presence of coronary angioplasty implant and graft: Secondary | ICD-10-CM | POA: Diagnosis not present

## 2021-09-03 DIAGNOSIS — N179 Acute kidney failure, unspecified: Secondary | ICD-10-CM | POA: Diagnosis present

## 2021-09-03 DIAGNOSIS — R410 Disorientation, unspecified: Secondary | ICD-10-CM | POA: Diagnosis present

## 2021-09-03 DIAGNOSIS — E872 Acidosis, unspecified: Secondary | ICD-10-CM

## 2021-09-03 DIAGNOSIS — Y846 Urinary catheterization as the cause of abnormal reaction of the patient, or of later complication, without mention of misadventure at the time of the procedure: Secondary | ICD-10-CM | POA: Diagnosis present

## 2021-09-03 DIAGNOSIS — Z79899 Other long term (current) drug therapy: Secondary | ICD-10-CM | POA: Diagnosis not present

## 2021-09-03 LAB — CBC WITH DIFFERENTIAL/PLATELET
Abs Immature Granulocytes: 0.09 10*3/uL — ABNORMAL HIGH (ref 0.00–0.07)
Basophils Absolute: 0.1 10*3/uL (ref 0.0–0.1)
Basophils Relative: 1 %
Eosinophils Absolute: 0.1 10*3/uL (ref 0.0–0.5)
Eosinophils Relative: 0 %
HCT: 43.2 % (ref 36.0–46.0)
Hemoglobin: 14.2 g/dL (ref 12.0–15.0)
Immature Granulocytes: 1 %
Lymphocytes Relative: 7 %
Lymphs Abs: 1.3 10*3/uL (ref 0.7–4.0)
MCH: 32.6 pg (ref 26.0–34.0)
MCHC: 32.9 g/dL (ref 30.0–36.0)
MCV: 99.1 fL (ref 80.0–100.0)
Monocytes Absolute: 1.2 10*3/uL — ABNORMAL HIGH (ref 0.1–1.0)
Monocytes Relative: 7 %
Neutro Abs: 15 10*3/uL — ABNORMAL HIGH (ref 1.7–7.7)
Neutrophils Relative %: 84 %
Platelets: 334 10*3/uL (ref 150–400)
RBC: 4.36 MIL/uL (ref 3.87–5.11)
RDW: 12.8 % (ref 11.5–15.5)
WBC: 17.7 10*3/uL — ABNORMAL HIGH (ref 4.0–10.5)
nRBC: 0 % (ref 0.0–0.2)

## 2021-09-03 LAB — CBC
HCT: 41.6 % (ref 36.0–46.0)
Hemoglobin: 13.5 g/dL (ref 12.0–15.0)
MCH: 32.4 pg (ref 26.0–34.0)
MCHC: 32.5 g/dL (ref 30.0–36.0)
MCV: 99.8 fL (ref 80.0–100.0)
Platelets: 318 10*3/uL (ref 150–400)
RBC: 4.17 MIL/uL (ref 3.87–5.11)
RDW: 12.8 % (ref 11.5–15.5)
WBC: 16.8 10*3/uL — ABNORMAL HIGH (ref 4.0–10.5)
nRBC: 0 % (ref 0.0–0.2)

## 2021-09-03 LAB — COMPREHENSIVE METABOLIC PANEL
ALT: 27 U/L (ref 0–44)
AST: 44 U/L — ABNORMAL HIGH (ref 15–41)
Albumin: 3.2 g/dL — ABNORMAL LOW (ref 3.5–5.0)
Alkaline Phosphatase: 118 U/L (ref 38–126)
Anion gap: 13 (ref 5–15)
BUN: 31 mg/dL — ABNORMAL HIGH (ref 8–23)
CO2: 20 mmol/L — ABNORMAL LOW (ref 22–32)
Calcium: 8.9 mg/dL (ref 8.9–10.3)
Chloride: 105 mmol/L (ref 98–111)
Creatinine, Ser: 1.37 mg/dL — ABNORMAL HIGH (ref 0.44–1.00)
GFR, Estimated: 39 mL/min — ABNORMAL LOW (ref >60)
Glucose, Bld: 101 mg/dL — ABNORMAL HIGH (ref 70–99)
Potassium: 4.5 mmol/L (ref 3.5–5.1)
Sodium: 138 mmol/L (ref 135–145)
Total Bilirubin: 1.2 mg/dL (ref 0.3–1.2)
Total Protein: 7.4 g/dL (ref 6.5–8.1)

## 2021-09-03 LAB — URINALYSIS, ROUTINE W REFLEX MICROSCOPIC
Bilirubin Urine: NEGATIVE
Glucose, UA: NEGATIVE mg/dL
Ketones, ur: 5 mg/dL — AB
Nitrite: NEGATIVE
Protein, ur: 100 mg/dL — AB
RBC / HPF: >50 RBC/hpf — ABNORMAL HIGH (ref 0–5)
Specific Gravity, Urine: 1.014 (ref 1.005–1.030)
WBC, UA: >50 WBC/hpf — ABNORMAL HIGH (ref 0–5)
pH: 8 (ref 5.0–8.0)

## 2021-09-03 LAB — BASIC METABOLIC PANEL
Anion gap: 10 (ref 5–15)
BUN: 30 mg/dL — ABNORMAL HIGH (ref 8–23)
CO2: 20 mmol/L — ABNORMAL LOW (ref 22–32)
Calcium: 8.4 mg/dL — ABNORMAL LOW (ref 8.9–10.3)
Chloride: 107 mmol/L (ref 98–111)
Creatinine, Ser: 1.28 mg/dL — ABNORMAL HIGH (ref 0.44–1.00)
GFR, Estimated: 42 mL/min — ABNORMAL LOW (ref >60)
Glucose, Bld: 98 mg/dL (ref 70–99)
Potassium: 4.2 mmol/L (ref 3.5–5.1)
Sodium: 137 mmol/L (ref 135–145)

## 2021-09-03 LAB — PROTIME-INR
INR: 1.8 — ABNORMAL HIGH (ref 0.8–1.2)
Prothrombin Time: 20.4 seconds — ABNORMAL HIGH (ref 11.4–15.2)

## 2021-09-03 LAB — LACTIC ACID, PLASMA
Lactic Acid, Venous: 1.3 mmol/L (ref 0.5–1.9)
Lactic Acid, Venous: 1.9 mmol/L (ref 0.5–1.9)

## 2021-09-03 LAB — APTT: aPTT: 34 seconds (ref 24–36)

## 2021-09-03 LAB — TSH: TSH: 7.484 u[IU]/mL — ABNORMAL HIGH (ref 0.350–4.500)

## 2021-09-03 MED ORDER — ACETAMINOPHEN 650 MG RE SUPP: 650.0000 mg | Freq: Four times a day (QID) | RECTAL | Status: DC | PRN

## 2021-09-03 MED ORDER — SODIUM CHLORIDE 0.9 % IV SOLN
1.0000 g | Freq: Once | INTRAVENOUS | Status: AC
  Administered 2021-09-03: 1 g via INTRAVENOUS
  Filled 2021-09-03: qty 10

## 2021-09-03 MED ORDER — SODIUM CHLORIDE 0.9 % IV BOLUS (SEPSIS)
500.0000 mL | Freq: Once | INTRAVENOUS | Status: AC
  Administered 2021-09-03: 500 mL via INTRAVENOUS

## 2021-09-03 MED ORDER — ACETAMINOPHEN 325 MG PO TABS: 650.0000 mg | ORAL_TABLET | Freq: Four times a day (QID) | ORAL | Status: DC | PRN

## 2021-09-03 MED ORDER — SODIUM CHLORIDE 0.9 % IV SOLN
INTRAVENOUS | Status: DC | PRN
  Administered 2021-09-05: 10 mL/h via INTRAVENOUS

## 2021-09-03 MED ORDER — SODIUM CHLORIDE 0.9 % IV SOLN
1.0000 g | INTRAVENOUS | Status: DC
Start: 2021-09-03 — End: 2021-09-05
  Administered 2021-09-03 – 2021-09-04 (×2): 1 g via INTRAVENOUS
  Filled 2021-09-03 (×2): qty 10

## 2021-09-03 MED ORDER — ORAL CARE MOUTH RINSE: 15.0000 mL | OROMUCOSAL | Status: DC | PRN

## 2021-09-03 MED ORDER — SODIUM CHLORIDE 0.9 % IV SOLN: INTRAVENOUS | Status: AC

## 2021-09-03 NOTE — ED Provider Notes (Signed)
[facility] EMERGENCY DEPARTMENT Provider Note   CSN: [id number] Arrival date & time: 09/02/21  2328     History  Chief Complaint  Patient presents with   Altered Mental Status   Level 5 caveat due to altered mental status Yvonne Bailey is a 81 y.o. female.  The history is provided by the patient. The history is limited by the condition of the patient.  Altered Mental Status Presenting symptoms: confusion   Severity:  Moderate Timing:  Constant Progression:  Worsening Chronicity:  New Context: dementia   Patient with history of dementia, atrial fibrillation, CAD, chronic indwelling Foley catheter presents with altered mental status.  Is reported for at least 24 to-48 hours patient's had increasing confusion from her baseline.  Patient also reporting pain around her Foley site. No family is available to corroborate the history     Home Medications Prior to Admission medications   Medication Sig Start Date End Date Taking? Authorizing Provider  apixaban (ELIQUIS) 5 MG TABS tablet Take 1 tablet (5 mg total) by mouth 2 (two) times daily. 06/22/21   Pincus Sanes, MD  aspirin EC 81 MG tablet Take 1 tablet (81 mg total) by mouth daily. 04/21/17   Dyann Kief, PA-C  atorvastatin (LIPITOR) 80 MG tablet TAKE 1 TABLET(80 MG) BY MOUTH DAILY Patient taking differently: Take 80 mg by mouth daily. 07/30/21   Burns, Bobette Mo, MD  benazepril (LOTENSIN) 20 MG tablet TAKE 1 AND 1/2 TABLETS(30 MG) BY MOUTH DAILY Patient taking differently: Take 30 mg by mouth daily. 02/06/21   Pincus Sanes, MD  brimonidine (ALPHAGAN) 0.2 % ophthalmic solution Place 1 drop into both eyes 2 (two) times daily. 12/24/17   [provider]  dorzolamide-timolol (COSOPT) 22.3-6.8 MG/ML ophthalmic solution Place 1 drop into both eyes 2 (two) times daily.     [provider]  estradiol (ESTRACE) 0.1 MG/GM vaginal cream Place vaginally. 01/15/21   [provider]   furosemide (LASIX) 20 MG tablet Take 1 tablet (20 mg total) by mouth daily as needed for edema. 11/03/20   Lorin Glass, MD  levothyroxine (SYNTHROID) 88 MCG tablet TAKE 1 TABLET(88 MCG) BY MOUTH DAILY Patient taking differently: Take 88 mcg by mouth daily before breakfast. 08/07/21   Burns, Bobette Mo, MD  metoprolol succinate (TOPROL-XL) 50 MG 24 hr tablet Take 1 tablet (50 mg total) by mouth daily. Take with or immediately following a meal. Please make overdue appt with Dr Clifton James before anymore refills. Thank you 3rd and Final Attempt 07/05/21   Kathleene Hazel, MD  MYRBETRIQ 25 MG TB24 tablet TAKE 1 TABLET(25 MG) BY MOUTH DAILY Patient taking differently: Take 25 mg by mouth daily. 04/26/21   Pincus Sanes, MD  nitroGLYCERIN (NITROSTAT) 0.4 MG SL tablet Place 1 tablet (0.4 mg total) under the tongue every 5 (five) minutes as needed for chest pain. 09/08/15   Little Ishikawa, NP  nystatin-triamcinolone ointment [facility]) Apply 1 application topically 2 (two) times daily. 08/29/20   Myrlene Broker, MD  polyethylene glycol (MIRALAX) 17 g packet Take one dose 3 times a day until bowel clear, maximum of 3 consecutive days. Patient taking differently: Take 17 g by mouth daily as needed for mild constipation. 10/30/20   Elpidio Anis, PA-C  sertraline (ZOLOFT) 100 MG tablet Take 1 tablet (100 mg total) by mouth daily. 05/24/21   Pincus Sanes, MD  spironolactone (ALDACTONE) 25 MG tablet TAKE 1 TABLET(25 MG) BY MOUTH DAILY  Patient taking differently: Take 25 mg by mouth daily. 11/06/20   Pincus Sanes, MD  travoprost, benzalkonium, (TRAVATAN) 0.004 % ophthalmic solution Place 1 drop into both eyes at bedtime.    [provider]      Allergies    Definity [perflutren lipid microsphere] and Cilostazol    Review of Systems   Review of Systems  Unable to perform ROS: Mental status change  Psychiatric/Behavioral:  Positive for confusion.     Physical Exam Updated Vital Signs BP  (!) 171/86 (BP Location: Right Arm)   Pulse (!) 106   Temp 97.7 F (36.5 C) (Oral)   Resp (!) 23   SpO2 98%  Physical Exam CONSTITUTIONAL: Elderly and frail HEAD: Normocephalic/atraumatic EYES: EOMI/PERRL ENMT: Mucous membranes dry NECK: supple no meningeal signs SPINE/BACK:entire spine nontender CV: S1/S2 noted, no murmurs/rubs/gallops noted LUNGS: Lungs are clear to auscultation bilaterally, no apparent distress ABDOMEN: soft, suprapubic tenderness, no rebound or guarding, bowel sounds noted throughout abdomen GU: Patient wearing a diaper, Foley catheter present on arrival NEURO: Pt is awake/alert but appears confused.  Moves all extremities x4 EXTREMITIES: pulses normal/equal, full ROM All other extremities/joints palpated/ranged and nontender SKIN: warm, color normal   ED Results / Procedures / Treatments   Labs (all labs ordered are listed, but only abnormal results are displayed) Labs Reviewed  COMPREHENSIVE METABOLIC PANEL - Abnormal; Notable for the following components:      Result Value   CO2 20 (*)    Glucose, Bld 101 (*)    BUN 31 (*)    Creatinine, Ser 1.37 (*)    Albumin 3.2 (*)    AST 44 (*)    GFR, Estimated 39 (*)    All other components within normal limits  CBC WITH DIFFERENTIAL/PLATELET - Abnormal; Notable for the following components:   WBC 17.7 (*)    Neutro Abs 15.0 (*)    Monocytes Absolute 1.2 (*)    Abs Immature Granulocytes 0.09 (*)    All other components within normal limits  URINALYSIS, ROUTINE W REFLEX MICROSCOPIC - Abnormal; Notable for the following components:   APPearance TURBID (*)    Hgb urine dipstick MODERATE (*)    Ketones, ur 5 (*)    Protein, ur 100 (*)    Leukocytes,Ua MODERATE (*)    RBC / HPF >50 (*)    WBC, UA >50 (*)    Bacteria, UA MANY (*)    Non Squamous Epithelial 0-5 (*)    All other components within normal limits  PROTIME-INR - Abnormal; Notable for the following components:   Prothrombin Time 20.4 (*)     INR 1.8 (*)    All other components within normal limits  CULTURE, BLOOD (ROUTINE X 2)  CULTURE, BLOOD (ROUTINE X 2)  URINE CULTURE  LACTIC ACID, PLASMA  APTT   Prehospital rhythm strip revealed atrial fibrillation with a heart rate of 102 and PVCs noted EKG EKG Interpretation  Date/Time:  Sunday September 02 2021 23:38:33 EDT Ventricular Rate:  100 PR Interval:    QRS Duration: 99 QT Interval:  369 QTC Calculation: 474 R Axis:   97 Text Interpretation: Atrial fibrillation Anterolateral infarct, old Interpretation limited secondary to artifact Confirmed by Zadie Rhine ([id number]) on 09/02/2021 11:48:00 PM    Radiology CT Head Wo Contrast  Result Date: 09/03/2021 CLINICAL DATA:  Altered mental status for 2 days, initial encounter EXAM: CT HEAD WITHOUT CONTRAST TECHNIQUE: Contiguous axial images were obtained from the base of the skull through  the vertex without intravenous contrast. RADIATION DOSE REDUCTION: This exam was performed according to the departmental dose-optimization program which includes automated exposure control, adjustment of the mA and/or kV according to patient size and/or use of iterative reconstruction technique. COMPARISON:  09/08/2020 FINDINGS: Brain: No evidence of acute infarction, hemorrhage, hydrocephalus, extra-axial collection or mass lesion/mass effect. Chronic atrophic and ischemic changes are noted. 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 Right basal ganglia/internal capsule infarct is also noted stable from the prior study. Vascular: No hyperdense vessel or unexpected calcification. Skull: Normal. Negative for fracture or focal lesion. Sinuses/Orbits: No acute finding. Other: None. IMPRESSION: Remote infarcts stable from the prior exam. No acute abnormality is noted. Atrophic changes and chronic white matter ischemic change. Electronically Signed   By: Alcide Clever M.D.   On: 09/03/2021 00:27   DG Chest Port  1 View  Result Date: 09/03/2021 CLINICAL DATA:  Sepsis EXAM: PORTABLE CHEST 1 VIEW COMPARISON:  10/17/2020 FINDINGS: Lungs are clear. No pneumothorax or pleural effusion. Cardiac size is mildly enlarged, unchanged. Pulmonary vascularity is normal. IMPRESSION: No radiographic evidence of acute cardiopulmonary disease. Electronically Signed   By: Helyn Numbers M.D.   On: 09/03/2021 00:18    Procedures Procedures    Medications Ordered in ED Medications  sodium chloride 0.9 % bolus 500 mL (0 mLs Intravenous Stopped 09/03/21 0241)  cefTRIAXone (ROCEPHIN) 1 g in sodium chloride 0.9 % 100 mL IVPB (0 g Intravenous Stopped 09/03/21 0242)    ED Course/ Medical Decision Making/ A&P Clinical Course as of 09/03/21 0248  Mon Sep 03, 2021  0020 WBC(!): 17.7 Leukocytosis [DW]  0025 Creatinine(!): 1.37 Renal insufficiency [DW]  0103 Pt improved, awake/alert, requesting fluids.  No focal ABD tenderness.  Foley cath. Changed and now has appropriate UOP [DW]  0152 Discussed with son via phone.  He reports she appeared to have some bleeding around her catheter and was worried she was having internal bleeding.  She has had some increased confusion.  He reports she has only had a catheter for a little over a week due to chronic incontinence [DW]  0153 Plan to admit patient for IV antibiotics [DW]    Clinical Course User Index [DW] Zadie Rhine, MD                           Medical Decision Making Amount and/or Complexity of Data Reviewed Labs: ordered. Decision-making details documented in ED Course. Radiology: ordered.  Risk Decision regarding hospitalization.   This patient presents to the ED for concern of altered mental status, this involves an extensive number of treatment options, and is a complaint that carries with it a high risk of complications and morbidity.  The differential diagnosis includes but is not limited to CVA, intracranial hemorrhage, sepsis, UTI, pneumonia,  electrolyte  Comorbidities that complicate the patient evaluation: Patient's presentation is complicated by their history of dementia, atrial fibrillation on anticoagulation  Additional history obtained: Records reviewed Primary Care Documents  Lab Tests: I Ordered, and personally interpreted labs.  The pertinent results include: Leukocytosis, UTI noted  Imaging Studies ordered: I ordered imaging studies including CT scan head and X-ray chest   I independently visualized and interpreted imaging which showed no acute findings I agree with the radiologist interpretation  Cardiac Monitoring: The patient was maintained on a cardiac monitor.  I personally viewed and interpreted the cardiac monitor which showed an underlying rhythm of:  Atrial Fibrillation  Medicines ordered and prescription drug management: I ordered medication including Rocephin for UTI IV fluids for dehydration Reevaluation of the patient after these medicines showed that the patient    improved  Critical Interventions:  IV fluids and IV antibiotics  Consultations Obtained: I requested consultation with the admitting physician Dr. Loney Loh , and discussed  findings as well as pertinent plan - they recommend: Admission  Reevaluation: After the interventions noted above, I reevaluated the patient and found that they have :improved  Complexity of problems addressed: Patient's presentation is most consistent with  acute presentation with potential threat to life or bodily function  Disposition: After consideration of the diagnostic results and the patient's response to treatment,  I feel that the patent would benefit from admission   .           Final Clinical Impression(s) / ED Diagnoses Final diagnoses:  Acute cystitis with hematuria  Delirium    Rx / DC Orders ED Discharge Orders     None         Zadie Rhine, MD 09/03/21 [phone]

## 2021-09-03 NOTE — Care Plan (Signed)
This 81 years old female with PMH significant for mild dementia, CAD status post PCI/multiple stents, chronic systolic CHF/ischemic cardiomyopathy, persistent A-fib on Eliquis, carotid artery disease, DVT, hypertension, hyperlipidemia, hypothyroidism, CVA, PAD, prediabetes, CKD stage IIIa, Chronic indwelling Foley catheter presented to the ED via EMS for evaluation of altered mental status and family was concerned about blood in Foley catheter.  Foley catheter was exchanged in the ED.  Patient was admitted for severe sepsis secondary to CAUTI(catheter associated UTI).  CT head negative for any acute abnormalities.  Patient is started on empiric antibiotics and was given IV hydration.  Patient mental status has improved back to her baseline.  Patient was seen and examined at bedside, appears much improved.  She answers questions appropriately.  Hematuria has resolved.  Continue IV antibiotics.

## 2021-09-04 DIAGNOSIS — N390 Urinary tract infection, site not specified: Secondary | ICD-10-CM | POA: Diagnosis not present

## 2021-09-04 DIAGNOSIS — T83511A Infection and inflammatory reaction due to indwelling urethral catheter, initial encounter: Secondary | ICD-10-CM | POA: Diagnosis not present

## 2021-09-04 LAB — CBC
HCT: 37 % (ref 36.0–46.0)
Hemoglobin: 11.9 g/dL — ABNORMAL LOW (ref 12.0–15.0)
MCH: 32.1 pg (ref 26.0–34.0)
MCHC: 32.2 g/dL (ref 30.0–36.0)
MCV: 99.7 fL (ref 80.0–100.0)
Platelets: 284 10*3/uL (ref 150–400)
RBC: 3.71 MIL/uL — ABNORMAL LOW (ref 3.87–5.11)
RDW: 13.1 % (ref 11.5–15.5)
WBC: 12.8 10*3/uL — ABNORMAL HIGH (ref 4.0–10.5)
nRBC: 0 % (ref 0.0–0.2)

## 2021-09-04 LAB — PHOSPHORUS: Phosphorus: 2.5 mg/dL (ref 2.5–4.6)

## 2021-09-04 LAB — BASIC METABOLIC PANEL
Anion gap: 9 (ref 5–15)
BUN: 29 mg/dL — ABNORMAL HIGH (ref 8–23)
CO2: 20 mmol/L — ABNORMAL LOW (ref 22–32)
Calcium: 8.1 mg/dL — ABNORMAL LOW (ref 8.9–10.3)
Chloride: 111 mmol/L (ref 98–111)
Creatinine, Ser: 1.1 mg/dL — ABNORMAL HIGH (ref 0.44–1.00)
GFR, Estimated: 50 mL/min — ABNORMAL LOW (ref >60)
Glucose, Bld: 97 mg/dL (ref 70–99)
Potassium: 3.9 mmol/L (ref 3.5–5.1)
Sodium: 140 mmol/L (ref 135–145)

## 2021-09-04 LAB — MAGNESIUM: Magnesium: 1.8 mg/dL (ref 1.7–2.4)

## 2021-09-04 MED ORDER — LEVOTHYROXINE SODIUM 88 MCG PO TABS
88.0000 ug | ORAL_TABLET | Freq: Every day | ORAL | Status: DC
  Administered 2021-09-05: 88 ug via ORAL
  Filled 2021-09-04: qty 1

## 2021-09-04 MED ORDER — CHLORHEXIDINE GLUCONATE CLOTH 2 % EX PADS
6.0000 | MEDICATED_PAD | Freq: Every day | CUTANEOUS | Status: DC
  Administered 2021-09-04 – 2021-09-05 (×2): 6 via TOPICAL

## 2021-09-04 MED ORDER — APIXABAN 5 MG PO TABS
5.0000 mg | ORAL_TABLET | Freq: Two times a day (BID) | ORAL | Status: DC
  Administered 2021-09-04 – 2021-09-05 (×2): 5 mg via ORAL
  Filled 2021-09-04 (×2): qty 1

## 2021-09-04 MED ORDER — ASPIRIN 81 MG PO TBEC
81.0000 mg | DELAYED_RELEASE_TABLET | Freq: Every day | ORAL | Status: DC
Start: 2021-09-04 — End: 2021-09-05
  Administered 2021-09-04 – 2021-09-05 (×2): 81 mg via ORAL
  Filled 2021-09-04: qty 1

## 2021-09-04 MED ORDER — ATORVASTATIN CALCIUM 80 MG PO TABS
80.0000 mg | ORAL_TABLET | Freq: Every day | ORAL | Status: DC
  Administered 2021-09-04 – 2021-09-05 (×2): 80 mg via ORAL
  Filled 2021-09-04: qty 1

## 2021-09-04 MED ORDER — SERTRALINE HCL 100 MG PO TABS
100.0000 mg | ORAL_TABLET | Freq: Every day | ORAL | Status: DC
  Administered 2021-09-04 – 2021-09-05 (×2): 100 mg via ORAL
  Filled 2021-09-04 (×2): qty 1

## 2021-09-04 NOTE — Progress Notes (Signed)
Yvonne NOTE    TRANISE TUAZON  ZOX:[id number] DOB: 18-Feb-1941 DOA: 09/02/2021 PCP: Pincus Sanes, Yvonne   Brief Narrative:  This 81 years old female with PMH significant for mild Bailey, Yvonne Bailey, Yvonne Bailey, Yvonne Bailey, Yvonne Bailey, Yvonne Bailey, Yvonne Bailey, hyperlipidemia, hypothyroidism, CVA, PAD, prediabetes, CKD stage IIIa, Yvonne indwelling Foley catheter presented to the ED via EMS for evaluation of altered mental status and family was concerned about blood in Foley catheter.  Foley catheter was exchanged in the ED.  Patient was admitted for severe sepsis secondary to CAUTI(catheter associated UTI).  CT head negative for any acute abnormalities.  Patient is started on empiric antibiotics and was given IV hydration.  Patient mental status has improved back to her baseline. She answers questions appropriately. Hematuria has resolved.  Continue IV antibiotics.  Assessment & Plan:   Principal Problem:   Catheter-associated urinary tract infection (HCC) Active Problems:   Hypothyroidism   Dyslipidemia   Essential Yvonne Bailey   Atrial fibrillation (HCC)   Yvonne systolic heart failure (HCC)   Acute kidney injury superimposed on Yvonne kidney Bailey (HCC)   Severe sepsis (HCC)   Acute metabolic encephalopathy   Metabolic acidosis  Sepsis secondary to catheter associated UTI: She meets criteria for severe sepsis with tachycardia, tachypnea, leukocytosis, acute encephalopathy. UA : leukocytes and microscopy showing greater than 50 RBCs, greater than 50 WBCs, and many bacteria.  Continue empiric ceftriaxone. Continue gentle IV fluid hydration and monitor volume status closely given Yvonne CHF. Urine culture growing 100,000 K of Proteus, sensitivity pending.  Blood cultures: NGTD Lactic acid 1.9> 1.9> 1.3 Foley catheter exchanged in the ED.   Acute metabolic encephalopathy: > Resolved. Likely  due to UTI.  CT head negative for acute abnormality. Mentation seems to have improved and now back to baseline,  She is answering questions appropriately. Mild Bailey at baseline.   AKI on CKD stage IIIa: Likely prerenal in the setting of dehydration and severe sepsis. Creatinine on admission 1.3 (baseline 0.9-1.0) Continue gentle IV fluid hydration. Renal functions has improved, avoid nephrotoxic medications.  Hematuria: > Resolved. Likely related to injury from Foley catheter. Foley has been exchanged, urine has cleared. Continue aspirin and Bailey.  Yvonne systolic CHF /ischemic Bailey: Echo done August 2022 showing LVEF 35 to 40%.  No signs of volume overload. -Hold diuretics given AKI/severe sepsis. -Monitor volume status closely   Yvonne status post PCI /multiple Bailey:  Not endorsing anginal symptoms. Continue aspirin and statins.  Yvonne A-fib:  Currently rate controlled.  Continue Bailey.  Essential HTN:  Hold blood pressure medication as blood pressure is on lower side.  Hyperlipidemia: Continue Lipitor 80 mg daily.  Hypothyroidism:  Continue levothyroxine 88 mcg daily    Yvonne Bailey prophylaxis: Bailey Code Status: Full code. Family Communication:  No family at bed side. Disposition Plan:Status is: Inpatient Remains inpatient appropriate because:   Admitted for altered mental status and severe sepsis secondary to catheter associated UTI.  Urine culture growing Proteus, sensitivity pending.   Anticipated discharge to skilled nursing facility 09/05/2021.  Consultants:  None  Procedures: None  Antimicrobials:   Anti-infectives (From admission, onward)    Start     Dose/Rate Route Frequency Ordered Stop   09/03/21 2200  cefTRIAXone (ROCEPHIN) 1 g in sodium chloride 0.9 % 100 mL IVPB        1 g 200 mL/hr over 30 Minutes Intravenous Every 24 hours 09/03/21 0255     09/03/21 0130  cefTRIAXone (ROCEPHIN) 1 g in sodium chloride 0.9 % 100 mL IVPB         1 g 200 mL/hr over 30 Minutes Intravenous  Once 09/03/21 0127 09/03/21 0242       Subjective: Patient was seen and examined at bedside.  Overnight events noted.  Patient reports feeling better, She is back to her baseline mental status,  answering questions appropriately.  Objective: Vitals:   09/03/21 2355 09/04/21 0434 09/04/21 0725 09/04/21 1113  BP: (!) 101/56 (!) 125/57 121/75 (!) 101/59  Pulse: 91 79 79 76  Resp: 20 20 20 16   Temp: 98.7 F (37.1 C) 98.7 F (37.1 C) (!) 97.3 F (36.3 C) 97.7 F (36.5 C)  TempSrc: Oral Oral  Oral  SpO2: 99% 100% 100% 100%  Weight:  70.5 kg    Height:        Intake/Output Summary (Last 24 hours) at 09/04/2021 1340 Last data filed at 09/04/2021 1324 Gross per 24 hour  Intake 1987.78 ml  Output 875 ml  Net 1112.78 ml   Filed Weights   09/03/21 0530 09/04/21 0434  Weight: 68.8 kg 70.5 kg    Examination:  General exam: Appears comfortable, not in any acute distress.  Deconditioned Respiratory system: Clear, no wheezing, no crackles, normal respiratory effort. Cardiovascular system: S1 & S2 heard, regular rate and rhythm, no murmur. Gastrointestinal system: Abdomen is soft, non tender, non distended, BS+ Central nervous system: Alert and oriented x 2. No focal neurological deficits. Extremities: No edema, no cyanosis, no clubbing. Skin: No rashes, lesions or ulcers Psychiatry: Judgement and insight appear normal. Mood & affect appropriate.     Data Reviewed: I have personally reviewed following labs and imaging studies  CBC: Recent Labs  Lab 09/02/21 2345 09/03/21 0357 09/04/21 0329  WBC 17.7* 16.8* 12.8*  NEUTROABS 15.0*  --   --   HGB 14.2 13.5 11.9*  HCT 43.2 41.6 37.0  MCV 99.1 99.8 99.7  PLT 334 318 284   Basic Metabolic Panel: Recent Labs  Lab 09/02/21 2345 09/03/21 0357 09/04/21 0329  NA 138 137 140  K 4.5 4.2 3.9  CL 105 107 111  CO2 20* 20* 20*  GLUCOSE 101* 98 97  BUN 31* 30* 29*  CREATININE  1.37* 1.28* 1.10*  CALCIUM 8.9 8.4* 8.1*  MG  --   --  1.8  PHOS  --   --  2.5   GFR: Estimated Creatinine Clearance: 39 mL/min (A) (by C-G formula based on SCr of 1.1 mg/dL (H)). Liver Function Tests: Recent Labs  Lab 09/02/21 2345  AST 44*  ALT 27  ALKPHOS 118  BILITOT 1.2  PROT 7.4  ALBUMIN 3.2*   No results for input(s): "LIPASE", "AMYLASE" in the last 168 hours. No results for input(s): "AMMONIA" in the last 168 hours. Coagulation Profile: Recent Labs  Lab 09/02/21 2356  INR 1.8*   Cardiac Enzymes: No results for input(s): "CKTOTAL", "CKMB", "CKMBINDEX", "TROPONINI" in the last 168 hours. BNP (last 3 results) No results for input(s): "PROBNP" in the last 8760 hours. HbA1C: No results for input(s): "HGBA1C" in the last 72 hours. CBG: No results for input(s): "GLUCAP" in the last 168 hours. Lipid Profile: No results for input(s): "CHOL", "HDL", "LDLCALC", "TRIG", "CHOLHDL", "LDLDIRECT" in the last 72 hours. Thyroid Function Tests: Recent Labs    09/03/21 0357  TSH 7.484*   Anemia Panel: No results for input(s): "VITAMINB12", "FOLATE", "FERRITIN", "TIBC", "IRON", "RETICCTPCT" in the last 72 hours. Sepsis Labs: Recent Labs  Lab 09/02/21 2345 09/03/21 0357  LATICACIDVEN 1.9 1.3    Recent Results (from the past 240 hour(s))  Urine Culture     Status: Abnormal (Preliminary result)   Collection Time: 09/02/21 11:47 PM   Specimen: In/Out Cath Urine  Result Value Ref Range Status   Specimen Description IN/OUT CATH URINE  Final   Special Requests   Final    NONE Performed at [facility], [address], [place], [place] [address]    Culture >=100,000 COLONIES/mL PROTEUS MIRABILIS (A)  Final   Report Status PENDING  Incomplete  Blood Culture (routine x 2)     Status: None (Preliminary result)   Collection Time: 09/02/21 11:52 PM   Specimen: BLOOD  Result Value Ref Range Status   Specimen Description BLOOD SITE NOT SPECIFIED  Final   Special  Requests   Final    BOTTLES DRAWN AEROBIC AND ANAEROBIC Blood Culture adequate volume   Culture   Final    NO GROWTH 1 DAY Performed at [facility], [address], [place], [place] [address]    Report Status PENDING  Incomplete  Blood Culture (routine x 2)     Status: None (Preliminary result)   Collection Time: 09/03/21 12:40 AM   Specimen: BLOOD  Result Value Ref Range Status   Specimen Description BLOOD RIGHT ANTECUBITAL  Final   Special Requests   Final    BOTTLES DRAWN AEROBIC AND ANAEROBIC Blood Culture adequate volume   Culture   Final    NO GROWTH 1 DAY Performed at [facility], [address] [place], [place] [address]    Report Status PENDING  Incomplete    Radiology Studies: CT Head Wo Contrast  Result Date: 09/03/2021 CLINICAL DATA:  Altered mental status for 2 days, initial encounter EXAM: CT HEAD WITHOUT CONTRAST TECHNIQUE: Contiguous axial images were obtained from the base of the skull through the vertex without intravenous contrast. RADIATION DOSE REDUCTION: This exam was performed according to the departmental dose-optimization program which includes automated exposure control, adjustment of the mA and/or kV according to patient size and/or use of iterative reconstruction technique. COMPARISON:  09/08/2020 FINDINGS: Brain: No evidence of acute infarction, hemorrhage, hydrocephalus, extra-axial collection or mass lesion/mass effect. Yvonne atrophic and ischemic changes are noted. 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 Right basal ganglia/internal capsule infarct is also noted stable from the prior study. Vascular: No hyperdense vessel or unexpected calcification. Skull: Normal. Negative for fracture or focal lesion. Sinuses/Orbits: No acute finding. Other: None. IMPRESSION: Remote infarcts stable from the prior exam. No acute abnormality is noted. Atrophic changes and Yvonne white matter ischemic change.  Electronically Signed   By: Alcide Clever M.D.   On: 09/03/2021 00:27   DG Chest Port 1 View  Result Date: 09/03/2021 CLINICAL DATA:  Sepsis EXAM: PORTABLE CHEST 1 VIEW COMPARISON:  10/17/2020 FINDINGS: Lungs are clear. No pneumothorax or pleural effusion. Cardiac size is mildly enlarged, unchanged. Pulmonary vascularity is normal. IMPRESSION: No radiographic evidence of acute cardiopulmonary Bailey. Electronically Signed   By: Helyn Numbers M.D.   On: 09/03/2021 00:18     Scheduled Meds:  apixaban  5 mg Oral BID   aspirin EC  81 mg Oral Daily   atorvastatin  80 mg Oral Daily   Chlorhexidine Gluconate Cloth  6 each Topical Daily   [START ON 09/05/2021] levothyroxine  88 mcg Oral QAC breakfast   sertraline  100  mg Oral Daily   Continuous Infusions:  sodium chloride 10 mL/hr at 09/04/21 0700   cefTRIAXone (ROCEPHIN)  IV 200 mL/hr at 09/04/21 0700     LOS: 1 day    Time spent: 50 mins    Khalil Szczepanik, Yvonne [facility]   If 7PM-7AM, please contact night-coverage

## 2021-09-05 DIAGNOSIS — R41841 Cognitive communication deficit: Secondary | ICD-10-CM | POA: Diagnosis not present

## 2021-09-05 DIAGNOSIS — I5022 Chronic systolic (congestive) heart failure: Secondary | ICD-10-CM | POA: Diagnosis not present

## 2021-09-05 DIAGNOSIS — I482 Chronic atrial fibrillation, unspecified: Secondary | ICD-10-CM | POA: Diagnosis not present

## 2021-09-05 DIAGNOSIS — M2550 Pain in unspecified joint: Secondary | ICD-10-CM | POA: Diagnosis not present

## 2021-09-05 DIAGNOSIS — N1831 Chronic kidney disease, stage 3a: Secondary | ICD-10-CM | POA: Diagnosis not present

## 2021-09-05 DIAGNOSIS — R2689 Other abnormalities of gait and mobility: Secondary | ICD-10-CM | POA: Diagnosis not present

## 2021-09-05 DIAGNOSIS — Z7401 Bed confinement status: Secondary | ICD-10-CM | POA: Diagnosis not present

## 2021-09-05 DIAGNOSIS — R339 Retention of urine, unspecified: Secondary | ICD-10-CM | POA: Diagnosis not present

## 2021-09-05 DIAGNOSIS — I4811 Longstanding persistent atrial fibrillation: Secondary | ICD-10-CM | POA: Diagnosis not present

## 2021-09-05 DIAGNOSIS — E039 Hypothyroidism, unspecified: Secondary | ICD-10-CM | POA: Diagnosis not present

## 2021-09-05 DIAGNOSIS — N179 Acute kidney failure, unspecified: Secondary | ICD-10-CM

## 2021-09-05 DIAGNOSIS — G9341 Metabolic encephalopathy: Secondary | ICD-10-CM

## 2021-09-05 DIAGNOSIS — I129 Hypertensive chronic kidney disease with stage 1 through stage 4 chronic kidney disease, or unspecified chronic kidney disease: Secondary | ICD-10-CM | POA: Diagnosis not present

## 2021-09-05 DIAGNOSIS — B964 Proteus (mirabilis) (morganii) as the cause of diseases classified elsewhere: Secondary | ICD-10-CM | POA: Diagnosis not present

## 2021-09-05 DIAGNOSIS — N189 Chronic kidney disease, unspecified: Secondary | ICD-10-CM | POA: Diagnosis not present

## 2021-09-05 DIAGNOSIS — I10 Essential (primary) hypertension: Secondary | ICD-10-CM | POA: Diagnosis not present

## 2021-09-05 DIAGNOSIS — T83511A Infection and inflammatory reaction due to indwelling urethral catheter, initial encounter: Secondary | ICD-10-CM | POA: Diagnosis not present

## 2021-09-05 DIAGNOSIS — I255 Ischemic cardiomyopathy: Secondary | ICD-10-CM | POA: Diagnosis not present

## 2021-09-05 DIAGNOSIS — T83511D Infection and inflammatory reaction due to indwelling urethral catheter, subsequent encounter: Secondary | ICD-10-CM | POA: Diagnosis not present

## 2021-09-05 DIAGNOSIS — A419 Sepsis, unspecified organism: Secondary | ICD-10-CM | POA: Diagnosis not present

## 2021-09-05 DIAGNOSIS — N390 Urinary tract infection, site not specified: Secondary | ICD-10-CM | POA: Diagnosis not present

## 2021-09-05 DIAGNOSIS — M6281 Muscle weakness (generalized): Secondary | ICD-10-CM | POA: Diagnosis not present

## 2021-09-05 LAB — CBC
HCT: 38.1 % (ref 36.0–46.0)
Hemoglobin: 12.4 g/dL (ref 12.0–15.0)
MCH: 32.5 pg (ref 26.0–34.0)
MCHC: 32.5 g/dL (ref 30.0–36.0)
MCV: 100 fL (ref 80.0–100.0)
Platelets: 280 10*3/uL (ref 150–400)
RBC: 3.81 MIL/uL — ABNORMAL LOW (ref 3.87–5.11)
RDW: 12.9 % (ref 11.5–15.5)
WBC: 9.8 10*3/uL (ref 4.0–10.5)
nRBC: 0 % (ref 0.0–0.2)

## 2021-09-05 LAB — BASIC METABOLIC PANEL
Anion gap: 7 (ref 5–15)
BUN: 24 mg/dL — ABNORMAL HIGH (ref 8–23)
CO2: 20 mmol/L — ABNORMAL LOW (ref 22–32)
Calcium: 8.1 mg/dL — ABNORMAL LOW (ref 8.9–10.3)
Chloride: 112 mmol/L — ABNORMAL HIGH (ref 98–111)
Creatinine, Ser: 0.92 mg/dL (ref 0.44–1.00)
GFR, Estimated: >60 mL/min (ref >60)
Glucose, Bld: 91 mg/dL (ref 70–99)
Potassium: 3.8 mmol/L (ref 3.5–5.1)
Sodium: 139 mmol/L (ref 135–145)

## 2021-09-05 LAB — URINE CULTURE: Culture: >=100000 — AB

## 2021-09-05 LAB — MAGNESIUM: Magnesium: 1.6 mg/dL — ABNORMAL LOW (ref 1.7–2.4)

## 2021-09-05 LAB — PHOSPHORUS: Phosphorus: 2.6 mg/dL (ref 2.5–4.6)

## 2021-09-05 MED ORDER — POTASSIUM CHLORIDE CRYS ER 20 MEQ PO TBCR
20.0000 meq | EXTENDED_RELEASE_TABLET | Freq: Once | ORAL | Status: AC
Start: 2021-09-05 — End: 2021-09-05
  Administered 2021-09-05: 20 meq via ORAL
  Filled 2021-09-05: qty 1

## 2021-09-05 MED ORDER — CEPHALEXIN 500 MG PO CAPS: 500.0000 mg | ORAL_CAPSULE | Freq: Three times a day (TID) | ORAL | 0 refills | Status: DC

## 2021-09-05 MED ORDER — MAGNESIUM SULFATE 4 GM/100ML IV SOLN
4.0000 g | Freq: Once | INTRAVENOUS | Status: AC
  Administered 2021-09-05: 4 g via INTRAVENOUS
  Filled 2021-09-05: qty 100

## 2021-09-05 MED ORDER — CEPHALEXIN 500 MG PO CAPS: 500.0000 mg | ORAL_CAPSULE | Freq: Two times a day (BID) | ORAL | 0 refills | Status: AC

## 2021-09-05 MED ORDER — SODIUM CHLORIDE 0.9 % IV SOLN
1.0000 g | Freq: Once | INTRAVENOUS | Status: AC
  Administered 2021-09-05: 1 g via INTRAVENOUS
  Filled 2021-09-05: qty 10

## 2021-09-05 NOTE — TOC Transition Note (Signed)
Transition of Care [facility]) - CM/SW Discharge Note   Patient Details  Name: Yvonne Bailey MRN: [id number] Date of Birth: 13-May-1940  Transition of Care [facility]) CM/SW Contact:  Erin Sons, LCSW Phone Number: 09/05/2021, 2:24 PM   Clinical Narrative:     SNF auth approved.   Patient will DC to: [facility] Anticipated DC date: 09/05/21 Family notified: Son, Yvonne Bailey Transport by: Sharin Mons   Per MD patient ready for DC to [facility]. RN, patient, patient's family, and facility notified of DC. Discharge Summary and FL2 sent to facility. RN to call report prior to discharge ( [phone]). DC packet on chart. Ambulance transport requested for patient.   CSW will sign off for now as social work intervention is no longer needed. Please consult [place] again if new needs arise.    Final next level of care: Skilled Nursing Facility Barriers to Discharge: No Barriers Identified   Patient Goals and CMS Choice        Discharge Placement              Patient chooses bed at: [facility] Patient to be transferred to facility by: [facility] Name of family member notified: Son, Yvonne Bailey Patient and family notified of of transfer: 09/05/21  Discharge Plan and Services                                     Social Determinants of Health (SDOH) Interventions     Readmission Risk Interventions     No data to display

## 2021-09-05 NOTE — Consult Note (Addendum)
   [facility] Inpatient Consult   09/05/2021  IZOLA TEAGUE 10-29-1940 [id number]  [employer] [ACO] Patient: [employer]  Primary Care Provider:  Binnie Rail, MD, [place] [facility] is an Embedded provider. Patient has listed  CCM members on Care Team.   Patient screened and discussed in progression morning meeting for a skilled nursing level of care.  Plan:  Needs to be met at SNF. Will alert CCM team member.  For questions contact:   Natividad Brood, RN BSN [facility] Liaison  [phone] business mobile phone Toll free office [phone]  Fax number: [phone] [name]

## 2021-09-05 NOTE — Progress Notes (Signed)
Mobility Specialist Progress Note:   09/05/21 1134  Mobility  Activity Ambulated with assistance in room  Level of Assistance Minimal assist, patient does 75% or more  Assistive Device Front wheel walker  Distance Ambulated (ft) 30 ft  Activity Response Tolerated well  $Mobility charge 1 Mobility   Pt received in bed willing to participate in mobility. No complaints of pain. Left in bed with call bell in reach and all needs met.   [facility] Mobility Specialist

## 2021-09-05 NOTE — Discharge Summary (Signed)
Physician Discharge Summary  Yvonne Bailey ZOX:[id number] DOB: 1940-04-13 DOA: 09/02/2021  PCP: Pincus Sanes, MD  Admit date: 09/02/2021 Discharge date: 09/05/2021  Admitted From: Home Disposition: Skilled nursing facility  Recommendations for Outpatient Follow-up:  Follow up with PCP in 1-2 weeks Please obtain BMP/CBC in one week Give voiding trial in 2 weeks.  Home Health: N/A Equipment/Devices: N/A  Discharge Condition: Stable CODE STATUS: Full code Diet recommendation: Regular diet  Discharge summary:  81 year old with extensive cardiovascular history including coronary artery disease status post multiple PCI, chronic systolic congestive heart failure, persistent A-fib on Eliquis, coronary artery disease, history of DVT, hypertension hyperlipidemia hypothyroidism CKD stage III AAA, urinary retention status post chronic indwelling Foley catheter brought to the emergency room with altered mental status and noted some blood in the Foley catheter by family.  She lives at home with family.  She was found to have grossly abnormal urinalysis and admitted to the hospital with sepsis secondary to UTI from indwelling Foley catheter.  Treated with antibiotics and IV hydration with improvement of neurological status.  Urine culture with Proteus.  Blood cultures negative.  Sepsis secondary to catheter associated UTI, acute metabolic encephalopathy secondary to sepsis: Presented with tachycardia, tachypnea and leukocytosis and acute encephalopathy. Urine culture with more than 100,000 colonies of Proteus, treated with Rocephin day 3 today.  Blood cultures negative. Catheter was exchanged in the ER on admission, will treat with Keflex 500 mg twice daily for another 11 days to complete 2 weeks of treatment.  We will treat as complicated UTI with 2 weeks of antibiotic therapy. Mental status has improved.  CT head was negative for any acute abnormalities.  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  Chronic urinary retention: Cause unknown.  Foley catheter was replaced.  Since there was significant retention, will advised not to remove it for 2 weeks.  Patient can be given a voiding trial in 2 weeks at a skilled rehab.  If unsuccessful voiding trial, reinsert Foley catheter and refer to urology.  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  Currently euvolemic. Review of records indicate that she was taken off Lasix and maintained on Aldactone that will be continued. Patient was taken off beta-blockers due to side effects and currently on benazepril.  She will continue this.  Persistent A-fib: Rate controlled.  Without any rate controlling medications as per outpatient records.  Therapeutic on Eliquis. Electrolytes were aggressively replaced before discharge.  Hyperlipidemia: Continue Lipitor 80 mg daily.  Hypothyroidism: On Synthroid 88 mcg daily.  Stable.  Symptomatically improved.  Sepsis physiology has resolved.  Patient is very deconditioned and will benefit with short-term rehab before going home.  Stable to discharge to skilled level of care.  Discharge Diagnoses:  Principal Problem:   Catheter-associated urinary tract infection (HCC) Active Problems:   Hypothyroidism   Dyslipidemia   Essential hypertension   Atrial fibrillation (HCC)   Chronic systolic heart failure (HCC)   Acute kidney injury superimposed on chronic kidney disease (HCC)   Severe sepsis (HCC)   Acute metabolic encephalopathy   Metabolic acidosis    Discharge Instructions  Discharge Instructions     Diet - low sodium heart healthy   Complete by: As directed    Discharge instructions   Complete by: As directed    Catheter care , discharge with foley catheter   Increase activity slowly   Complete by: As directed    No wound care  Complete by: As directed       Allergies as of 09/05/2021       Reactions    Definity [perflutren Lipid Microsphere] Other (See Comments)   Patient experienced back pain with injection   Pletal [cilostazol] Other (See Comments)   Made pt "feel funny"        Medication List     STOP taking these medications    furosemide 20 MG tablet Commonly known as: LASIX   metoprolol succinate 50 MG 24 hr tablet Commonly known as: TOPROL-XL       TAKE these medications    apixaban 5 MG Tabs tablet Commonly known as: Eliquis Take 1 tablet (5 mg total) by mouth 2 (two) times daily.   aspirin EC 81 MG tablet Take 1 tablet (81 mg total) by mouth daily.   atorvastatin 80 MG tablet Commonly known as: LIPITOR TAKE 1 TABLET(80 MG) BY MOUTH DAILY What changed: See the new instructions.   benazepril 20 MG tablet Commonly known as: LOTENSIN TAKE 1 AND 1/2 TABLETS(30 MG) BY MOUTH DAILY What changed: See the new instructions.   cephALEXin 500 MG capsule Commonly known as: KEFLEX Take 1 capsule (500 mg total) by mouth 2 (two) times daily for 11 days.   estradiol 0.1 MG/GM vaginal cream Commonly known as: ESTRACE Place vaginally.   levothyroxine 88 MCG tablet Commonly known as: SYNTHROID TAKE 1 TABLET(88 MCG) BY MOUTH DAILY What changed: See the new instructions.   Myrbetriq 25 MG Tb24 tablet Generic drug: mirabegron ER TAKE 1 TABLET(25 MG) BY MOUTH DAILY What changed: See the new instructions.   nitroGLYCERIN 0.4 MG SL tablet Commonly known as: Nitrostat Place 1 tablet (0.4 mg total) under the tongue every 5 (five) minutes as needed for chest pain.   polyethylene glycol 17 g packet Commonly known as: MiraLax Take one dose 3 times a day until bowel clear, maximum of 3 consecutive days. What changed:  how much to take how to take this when to take this reasons to take this additional instructions   sertraline 100 MG tablet Commonly known as: ZOLOFT Take 1 tablet (100 mg total) by mouth daily.   spironolactone 25 MG tablet Commonly known as:  ALDACTONE TAKE 1 TABLET(25 MG) BY MOUTH DAILY What changed: See the new instructions.        Allergies  Allergen Reactions   Definity [Perflutren Lipid Microsphere] Other (See Comments)    Patient experienced back pain with injection   Pletal [Cilostazol] Other (See Comments)    Made pt "feel funny"    Consultations: None   Procedures/Studies: CT Head Wo Contrast  Result Date: 09/03/2021 CLINICAL DATA:  Altered mental status for 2 days, initial encounter EXAM: CT HEAD WITHOUT CONTRAST TECHNIQUE: Contiguous axial images were obtained from the base of the skull through the vertex without intravenous contrast. RADIATION DOSE REDUCTION: This exam was performed according to the departmental dose-optimization program which includes automated exposure control, adjustment of the mA and/or kV according to patient size and/or use of iterative reconstruction technique. COMPARISON:  09/08/2020 FINDINGS: Brain: No evidence of acute infarction, hemorrhage, hydrocephalus, extra-axial collection or mass lesion/mass effect. Chronic atrophic and ischemic changes are noted. 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 Right basal ganglia/internal capsule infarct is also noted stable from the prior study. Vascular: No hyperdense vessel or unexpected calcification. Skull: Normal. Negative for fracture or focal lesion. Sinuses/Orbits: No acute finding. Other: None. IMPRESSION: Remote infarcts stable from  the prior exam. No acute abnormality is noted. Atrophic changes and chronic white matter ischemic change. Electronically Signed   By: Inez Catalina M.D.   On: 09/03/2021 00:27   DG Chest Port 1 View  Result Date: 09/03/2021 CLINICAL DATA:  Sepsis EXAM: PORTABLE CHEST 1 VIEW COMPARISON:  10/17/2020 FINDINGS: Lungs are clear. No pneumothorax or pleural effusion. Cardiac size is mildly enlarged, unchanged. Pulmonary vascularity is normal. IMPRESSION: No radiographic  evidence of acute cardiopulmonary disease. Electronically Signed   By: Fidela Salisbury M.D.   On: 09/03/2021 00:18   (Echo, Carotid, EGD, Colonoscopy, ERCP)    Subjective: Patient seen and examined.  Pleasant and conversant.  Oriented to self only.  Afebrile.  Urine is clear.  Patient is poor historian.  I attempted to reach patient's son and granddaughter, unable to reach.   Discharge Exam: Vitals:   09/05/21 0729 09/05/21 1106  BP: 139/81 132/71  Pulse: 63 76  Resp: 15 20  Temp: 97.6 F (36.4 C) (!) 97.5 F (36.4 C)  SpO2: 97% 100%   Vitals:   09/04/21 2025 09/05/21 0334 09/05/21 0729 09/05/21 1106  BP: (!) 123/51 134/66 139/81 132/71  Pulse: 79  63 76  Resp: 19 18 15 20   Temp: 98.5 F (36.9 C) 97.6 F (36.4 C) 97.6 F (36.4 C) (!) 97.5 F (36.4 C)  TempSrc: Oral Oral Oral Oral  SpO2: 100% 99% 97% 100%  Weight:  70.8 kg    Height:        General: Pt is alert, awake, not in acute distress Frail and debilitated.  Chronically sick looking. Flat affect. Cardiovascular: RRR, S1/S2 +, no rubs, no gallops Respiratory: CTA bilaterally, no wheezing, no rhonchi Abdominal: Soft, NT, ND, bowel sounds + Urine is clear in Foley catheter. Extremities: no edema, no cyanosis    The results of significant diagnostics from this hospitalization (including imaging, microbiology, ancillary and laboratory) are listed below for reference.     Microbiology: Recent Results (from the past 240 hour(s))  Urine Culture     Status: Abnormal   Collection Time: 09/02/21 11:47 PM   Specimen: In/Out Cath Urine  Result Value Ref Range Status   Specimen Description IN/OUT CATH URINE  Final   Special Requests   Final    NONE Performed at [facility], [address] [place],  [address]    Culture >=100,000 COLONIES/mL PROTEUS MIRABILIS (A)  Final   Report Status 09/05/2021 FINAL  Final   Organism ID, Bacteria PROTEUS MIRABILIS (A)  Final      Susceptibility   Proteus  mirabilis - MIC*    AMPICILLIN <=2 SENSITIVE Sensitive     CEFAZOLIN <=4 SENSITIVE Sensitive     CEFEPIME <=0.12 SENSITIVE Sensitive     CEFTRIAXONE <=0.25 SENSITIVE Sensitive     CIPROFLOXACIN <=0.25 SENSITIVE Sensitive     GENTAMICIN <=1 SENSITIVE Sensitive     IMIPENEM 2 SENSITIVE Sensitive     NITROFURANTOIN 128 RESISTANT Resistant     TRIMETH/SULFA <=20 SENSITIVE Sensitive     AMPICILLIN/SULBACTAM <=2 SENSITIVE Sensitive     PIP/TAZO <=4 SENSITIVE Sensitive     * >=100,000 COLONIES/mL PROTEUS MIRABILIS  Blood Culture (routine x 2)     Status: None (Preliminary result)   Collection Time: 09/02/21 11:52 PM   Specimen: BLOOD  Result Value Ref Range Status   Specimen Description BLOOD SITE NOT SPECIFIED  Final   Special Requests   Final    BOTTLES DRAWN AEROBIC AND ANAEROBIC Blood  Culture adequate volume   Culture   Final    NO GROWTH 2 DAYS Performed at [facility], [place] [address], [place], [place] [address]    Report Status PENDING  Incomplete  Blood Culture (routine x 2)     Status: None (Preliminary result)   Collection Time: 09/03/21 12:40 AM   Specimen: BLOOD  Result Value Ref Range Status   Specimen Description BLOOD RIGHT ANTECUBITAL  Final   Special Requests   Final    BOTTLES DRAWN AEROBIC AND ANAEROBIC Blood Culture adequate volume   Culture   Final    NO GROWTH 2 DAYS Performed at [facility], [place] [address] [place], [place] [address]    Report Status PENDING  Incomplete     Labs: BNP (last 3 results) No results for input(s): "BNP" in the last 8760 hours. Basic Metabolic Panel: Recent Labs  Lab 09/02/21 2345 09/03/21 0357 09/04/21 0329 09/05/21 0242  NA 138 137 140 139  K 4.5 4.2 3.9 3.8  CL 105 107 111 112*  CO2 20* 20* 20* 20*  GLUCOSE 101* 98 97 91  BUN 31* 30* 29* 24*  CREATININE 1.37* 1.28* 1.10* 0.92  CALCIUM 8.9 8.4* 8.1* 8.1*  MG  --   --  1.8 1.6*  PHOS  --   --  2.5 2.6   Liver Function Tests: Recent Labs  Lab  09/02/21 2345  AST 44*  ALT 27  ALKPHOS 118  BILITOT 1.2  PROT 7.4  ALBUMIN 3.2*   No results for input(s): "LIPASE", "AMYLASE" in the last 168 hours. No results for input(s): "AMMONIA" in the last 168 hours. CBC: Recent Labs  Lab 09/02/21 2345 09/03/21 0357 09/04/21 0329 09/05/21 0242  WBC 17.7* 16.8* 12.8* 9.8  NEUTROABS 15.0*  --   --   --   HGB 14.2 13.5 11.9* 12.4  HCT 43.2 41.6 37.0 38.1  MCV 99.1 99.8 99.7 100.0  PLT 334 318 284 280   Cardiac Enzymes: No results for input(s): "CKTOTAL", "CKMB", "CKMBINDEX", "TROPONINI" in the last 168 hours. BNP: Invalid input(s): "POCBNP" CBG: No results for input(s): "GLUCAP" in the last 168 hours. D-Dimer No results for input(s): "DDIMER" in the last 72 hours. Hgb A1c No results for input(s): "HGBA1C" in the last 72 hours. Lipid Profile No results for input(s): "CHOL", "HDL", "LDLCALC", "TRIG", "CHOLHDL", "LDLDIRECT" in the last 72 hours. Thyroid function studies Recent Labs    09/03/21 0357  TSH 7.484*   Anemia work up No results for input(s): "VITAMINB12", "FOLATE", "FERRITIN", "TIBC", "IRON", "RETICCTPCT" in the last 72 hours. Urinalysis    Component Value Date/Time   COLORURINE YELLOW 09/02/2021 2347   APPEARANCEUR TURBID (A) 09/02/2021 2347   APPEARANCEUR Cloudy (A) 03/27/2017 1332   LABSPEC 1.014 09/02/2021 2347   PHURINE 8.0 09/02/2021 2347   GLUCOSEU NEGATIVE 09/02/2021 2347   GLUCOSEU NEGATIVE 11/30/2020 1404   HGBUR MODERATE (A) 09/02/2021 2347   HGBUR negative 10/26/2007 1057   BILIRUBINUR NEGATIVE 09/02/2021 2347   BILIRUBINUR negative 04/14/2018 1447   BILIRUBINUR Negative 03/27/2017 1332   KETONESUR 5 (A) 09/02/2021 2347   PROTEINUR 100 (A) 09/02/2021 2347   UROBILINOGEN 0.2 11/30/2020 1404   NITRITE NEGATIVE 09/02/2021 2347   LEUKOCYTESUR MODERATE (A) 09/02/2021 2347   Sepsis Labs Recent Labs  Lab 09/02/21 2345 09/03/21 0357 09/04/21 0329 09/05/21 0242  WBC 17.7* 16.8* 12.8* 9.8    Microbiology Recent Results (from the past 240 hour(s))  Urine Culture  Status: Abnormal   Collection Time: 09/02/21 11:47 PM   Specimen: In/Out Cath Urine  Result Value Ref Range Status   Specimen Description IN/OUT CATH URINE  Final   Special Requests   Final    NONE Performed at [facility], [address] [place], [place] [address]    Culture >=100,000 COLONIES/mL PROTEUS MIRABILIS (A)  Final   Report Status 09/05/2021 FINAL  Final   Organism ID, Bacteria PROTEUS MIRABILIS (A)  Final      Susceptibility   Proteus mirabilis - MIC*    AMPICILLIN <=2 SENSITIVE Sensitive     CEFAZOLIN <=4 SENSITIVE Sensitive     CEFEPIME <=0.12 SENSITIVE Sensitive     CEFTRIAXONE <=0.25 SENSITIVE Sensitive     CIPROFLOXACIN <=0.25 SENSITIVE Sensitive     GENTAMICIN <=1 SENSITIVE Sensitive     IMIPENEM 2 SENSITIVE Sensitive     NITROFURANTOIN 128 RESISTANT Resistant     TRIMETH/SULFA <=20 SENSITIVE Sensitive     AMPICILLIN/SULBACTAM <=2 SENSITIVE Sensitive     PIP/TAZO <=4 SENSITIVE Sensitive     * >=100,000 COLONIES/mL PROTEUS MIRABILIS  Blood Culture (routine x 2)     Status: None (Preliminary result)   Collection Time: 09/02/21 11:52 PM   Specimen: BLOOD  Result Value Ref Range Status   Specimen Description BLOOD SITE NOT SPECIFIED  Final   Special Requests   Final    BOTTLES DRAWN AEROBIC AND ANAEROBIC Blood Culture adequate volume   Culture   Final    NO GROWTH 2 DAYS Performed at [facility], [address] [place], [place] [address]    Report Status PENDING  Incomplete  Blood Culture (routine x 2)     Status: None (Preliminary result)   Collection Time: 09/03/21 12:40 AM   Specimen: BLOOD  Result Value Ref Range Status   Specimen Description BLOOD RIGHT ANTECUBITAL  Final   Special Requests   Final    BOTTLES DRAWN AEROBIC AND ANAEROBIC Blood Culture adequate volume   Culture   Final    NO GROWTH 2 DAYS Performed at [facility], [address], [place], [place] [address]    Report Status PENDING  Incomplete     Time coordinating discharge: 35 minutes  SIGNED:   Dorcas Carrow, MD  [facility] 09/05/2021, 11:51 AM

## 2021-09-06 DIAGNOSIS — I5022 Chronic systolic (congestive) heart failure: Secondary | ICD-10-CM | POA: Diagnosis not present

## 2021-09-06 DIAGNOSIS — I4811 Longstanding persistent atrial fibrillation: Secondary | ICD-10-CM | POA: Diagnosis not present

## 2021-09-06 DIAGNOSIS — N1831 Chronic kidney disease, stage 3a: Secondary | ICD-10-CM | POA: Diagnosis not present

## 2021-09-06 DIAGNOSIS — G9341 Metabolic encephalopathy: Secondary | ICD-10-CM | POA: Diagnosis not present

## 2021-09-06 DIAGNOSIS — R339 Retention of urine, unspecified: Secondary | ICD-10-CM | POA: Diagnosis not present

## 2021-09-06 DIAGNOSIS — A419 Sepsis, unspecified organism: Secondary | ICD-10-CM | POA: Diagnosis not present

## 2021-09-06 DIAGNOSIS — I129 Hypertensive chronic kidney disease with stage 1 through stage 4 chronic kidney disease, or unspecified chronic kidney disease: Secondary | ICD-10-CM | POA: Diagnosis not present

## 2021-09-06 DIAGNOSIS — T83511A Infection and inflammatory reaction due to indwelling urethral catheter, initial encounter: Secondary | ICD-10-CM | POA: Diagnosis not present

## 2021-09-06 DIAGNOSIS — I255 Ischemic cardiomyopathy: Secondary | ICD-10-CM | POA: Diagnosis not present

## 2021-09-06 DIAGNOSIS — E039 Hypothyroidism, unspecified: Secondary | ICD-10-CM | POA: Diagnosis not present

## 2021-09-08 LAB — CULTURE, BLOOD (ROUTINE X 2)
Culture: NO GROWTH
Culture: NO GROWTH
Special Requests: ADEQUATE
Special Requests: ADEQUATE

## 2021-09-10 DIAGNOSIS — I5022 Chronic systolic (congestive) heart failure: Secondary | ICD-10-CM | POA: Diagnosis not present

## 2021-09-10 DIAGNOSIS — E039 Hypothyroidism, unspecified: Secondary | ICD-10-CM | POA: Diagnosis not present

## 2021-09-10 DIAGNOSIS — G9341 Metabolic encephalopathy: Secondary | ICD-10-CM | POA: Diagnosis not present

## 2021-09-10 DIAGNOSIS — I255 Ischemic cardiomyopathy: Secondary | ICD-10-CM | POA: Diagnosis not present

## 2021-09-10 DIAGNOSIS — A419 Sepsis, unspecified organism: Secondary | ICD-10-CM | POA: Diagnosis not present

## 2021-09-10 DIAGNOSIS — I129 Hypertensive chronic kidney disease with stage 1 through stage 4 chronic kidney disease, or unspecified chronic kidney disease: Secondary | ICD-10-CM | POA: Diagnosis not present

## 2021-09-10 DIAGNOSIS — I4811 Longstanding persistent atrial fibrillation: Secondary | ICD-10-CM | POA: Diagnosis not present

## 2021-09-10 DIAGNOSIS — N1831 Chronic kidney disease, stage 3a: Secondary | ICD-10-CM | POA: Diagnosis not present

## 2021-09-10 DIAGNOSIS — R339 Retention of urine, unspecified: Secondary | ICD-10-CM | POA: Diagnosis not present

## 2021-09-10 DIAGNOSIS — T83511A Infection and inflammatory reaction due to indwelling urethral catheter, initial encounter: Secondary | ICD-10-CM | POA: Diagnosis not present

## 2021-09-12 DIAGNOSIS — I10 Essential (primary) hypertension: Secondary | ICD-10-CM | POA: Diagnosis not present

## 2021-09-13 DIAGNOSIS — I129 Hypertensive chronic kidney disease with stage 1 through stage 4 chronic kidney disease, or unspecified chronic kidney disease: Secondary | ICD-10-CM | POA: Diagnosis not present

## 2021-09-13 DIAGNOSIS — E039 Hypothyroidism, unspecified: Secondary | ICD-10-CM | POA: Diagnosis not present

## 2021-09-13 DIAGNOSIS — N1831 Chronic kidney disease, stage 3a: Secondary | ICD-10-CM | POA: Diagnosis not present

## 2021-09-13 DIAGNOSIS — I4811 Longstanding persistent atrial fibrillation: Secondary | ICD-10-CM | POA: Diagnosis not present

## 2021-09-13 DIAGNOSIS — I5022 Chronic systolic (congestive) heart failure: Secondary | ICD-10-CM | POA: Diagnosis not present

## 2021-09-13 DIAGNOSIS — T83511A Infection and inflammatory reaction due to indwelling urethral catheter, initial encounter: Secondary | ICD-10-CM | POA: Diagnosis not present

## 2021-09-13 DIAGNOSIS — I255 Ischemic cardiomyopathy: Secondary | ICD-10-CM | POA: Diagnosis not present

## 2021-09-13 DIAGNOSIS — G9341 Metabolic encephalopathy: Secondary | ICD-10-CM | POA: Diagnosis not present

## 2021-09-13 DIAGNOSIS — A419 Sepsis, unspecified organism: Secondary | ICD-10-CM | POA: Diagnosis not present

## 2021-09-14 DIAGNOSIS — E039 Hypothyroidism, unspecified: Secondary | ICD-10-CM | POA: Diagnosis not present

## 2021-09-17 ENCOUNTER — Other Ambulatory Visit: Payer: Self-pay | Admitting: Internal Medicine

## 2021-09-17 DIAGNOSIS — I5022 Chronic systolic (congestive) heart failure: Secondary | ICD-10-CM | POA: Diagnosis not present

## 2021-09-17 DIAGNOSIS — I129 Hypertensive chronic kidney disease with stage 1 through stage 4 chronic kidney disease, or unspecified chronic kidney disease: Secondary | ICD-10-CM | POA: Diagnosis not present

## 2021-09-17 DIAGNOSIS — T83511A Infection and inflammatory reaction due to indwelling urethral catheter, initial encounter: Secondary | ICD-10-CM | POA: Diagnosis not present

## 2021-09-17 DIAGNOSIS — M6281 Muscle weakness (generalized): Secondary | ICD-10-CM | POA: Diagnosis not present

## 2021-09-17 DIAGNOSIS — I10 Essential (primary) hypertension: Secondary | ICD-10-CM | POA: Diagnosis not present

## 2021-09-17 DIAGNOSIS — R339 Retention of urine, unspecified: Secondary | ICD-10-CM | POA: Diagnosis not present

## 2021-09-17 DIAGNOSIS — E039 Hypothyroidism, unspecified: Secondary | ICD-10-CM | POA: Diagnosis not present

## 2021-09-17 DIAGNOSIS — I255 Ischemic cardiomyopathy: Secondary | ICD-10-CM | POA: Diagnosis not present

## 2021-09-17 DIAGNOSIS — G9341 Metabolic encephalopathy: Secondary | ICD-10-CM | POA: Diagnosis not present

## 2021-09-17 DIAGNOSIS — N1831 Chronic kidney disease, stage 3a: Secondary | ICD-10-CM | POA: Diagnosis not present

## 2021-09-17 DIAGNOSIS — A419 Sepsis, unspecified organism: Secondary | ICD-10-CM | POA: Diagnosis not present

## 2021-09-17 DIAGNOSIS — I482 Chronic atrial fibrillation, unspecified: Secondary | ICD-10-CM | POA: Diagnosis not present

## 2021-09-17 DIAGNOSIS — I739 Peripheral vascular disease, unspecified: Secondary | ICD-10-CM

## 2021-09-17 DIAGNOSIS — I4811 Longstanding persistent atrial fibrillation: Secondary | ICD-10-CM | POA: Diagnosis not present

## 2021-09-20 DIAGNOSIS — I255 Ischemic cardiomyopathy: Secondary | ICD-10-CM | POA: Diagnosis not present

## 2021-09-20 DIAGNOSIS — A419 Sepsis, unspecified organism: Secondary | ICD-10-CM | POA: Diagnosis not present

## 2021-09-20 DIAGNOSIS — E039 Hypothyroidism, unspecified: Secondary | ICD-10-CM | POA: Diagnosis not present

## 2021-09-20 DIAGNOSIS — G9341 Metabolic encephalopathy: Secondary | ICD-10-CM | POA: Diagnosis not present

## 2021-09-20 DIAGNOSIS — N1831 Chronic kidney disease, stage 3a: Secondary | ICD-10-CM | POA: Diagnosis not present

## 2021-09-20 DIAGNOSIS — I129 Hypertensive chronic kidney disease with stage 1 through stage 4 chronic kidney disease, or unspecified chronic kidney disease: Secondary | ICD-10-CM | POA: Diagnosis not present

## 2021-09-20 DIAGNOSIS — I5022 Chronic systolic (congestive) heart failure: Secondary | ICD-10-CM | POA: Diagnosis not present

## 2021-09-20 DIAGNOSIS — T83511A Infection and inflammatory reaction due to indwelling urethral catheter, initial encounter: Secondary | ICD-10-CM | POA: Diagnosis not present

## 2021-09-20 DIAGNOSIS — I4811 Longstanding persistent atrial fibrillation: Secondary | ICD-10-CM | POA: Diagnosis not present

## 2021-09-24 DIAGNOSIS — E039 Hypothyroidism, unspecified: Secondary | ICD-10-CM | POA: Diagnosis not present

## 2021-09-24 DIAGNOSIS — A419 Sepsis, unspecified organism: Secondary | ICD-10-CM | POA: Diagnosis not present

## 2021-09-24 DIAGNOSIS — I255 Ischemic cardiomyopathy: Secondary | ICD-10-CM | POA: Diagnosis not present

## 2021-09-24 DIAGNOSIS — I129 Hypertensive chronic kidney disease with stage 1 through stage 4 chronic kidney disease, or unspecified chronic kidney disease: Secondary | ICD-10-CM | POA: Diagnosis not present

## 2021-09-24 DIAGNOSIS — I4811 Longstanding persistent atrial fibrillation: Secondary | ICD-10-CM | POA: Diagnosis not present

## 2021-09-24 DIAGNOSIS — G9341 Metabolic encephalopathy: Secondary | ICD-10-CM | POA: Diagnosis not present

## 2021-09-24 DIAGNOSIS — I5022 Chronic systolic (congestive) heart failure: Secondary | ICD-10-CM | POA: Diagnosis not present

## 2021-09-24 DIAGNOSIS — R339 Retention of urine, unspecified: Secondary | ICD-10-CM | POA: Diagnosis not present

## 2021-09-24 DIAGNOSIS — T83511A Infection and inflammatory reaction due to indwelling urethral catheter, initial encounter: Secondary | ICD-10-CM | POA: Diagnosis not present

## 2021-09-24 DIAGNOSIS — N1831 Chronic kidney disease, stage 3a: Secondary | ICD-10-CM | POA: Diagnosis not present

## 2021-09-26 ENCOUNTER — Other Ambulatory Visit: Payer: Self-pay | Admitting: Internal Medicine

## 2021-09-27 DIAGNOSIS — I4811 Longstanding persistent atrial fibrillation: Secondary | ICD-10-CM | POA: Diagnosis not present

## 2021-09-27 DIAGNOSIS — N1831 Chronic kidney disease, stage 3a: Secondary | ICD-10-CM | POA: Diagnosis not present

## 2021-09-27 DIAGNOSIS — I5022 Chronic systolic (congestive) heart failure: Secondary | ICD-10-CM | POA: Diagnosis not present

## 2021-09-27 DIAGNOSIS — G9341 Metabolic encephalopathy: Secondary | ICD-10-CM | POA: Diagnosis not present

## 2021-09-27 DIAGNOSIS — E039 Hypothyroidism, unspecified: Secondary | ICD-10-CM | POA: Diagnosis not present

## 2021-09-27 DIAGNOSIS — A419 Sepsis, unspecified organism: Secondary | ICD-10-CM | POA: Diagnosis not present

## 2021-09-27 DIAGNOSIS — I129 Hypertensive chronic kidney disease with stage 1 through stage 4 chronic kidney disease, or unspecified chronic kidney disease: Secondary | ICD-10-CM | POA: Diagnosis not present

## 2021-09-27 DIAGNOSIS — I255 Ischemic cardiomyopathy: Secondary | ICD-10-CM | POA: Diagnosis not present

## 2021-09-27 DIAGNOSIS — T83511A Infection and inflammatory reaction due to indwelling urethral catheter, initial encounter: Secondary | ICD-10-CM | POA: Diagnosis not present

## 2021-09-28 DIAGNOSIS — R339 Retention of urine, unspecified: Secondary | ICD-10-CM | POA: Diagnosis not present

## 2021-10-01 DIAGNOSIS — I4811 Longstanding persistent atrial fibrillation: Secondary | ICD-10-CM | POA: Diagnosis not present

## 2021-10-01 DIAGNOSIS — I255 Ischemic cardiomyopathy: Secondary | ICD-10-CM | POA: Diagnosis not present

## 2021-10-01 DIAGNOSIS — N1831 Chronic kidney disease, stage 3a: Secondary | ICD-10-CM | POA: Diagnosis not present

## 2021-10-01 DIAGNOSIS — A419 Sepsis, unspecified organism: Secondary | ICD-10-CM | POA: Diagnosis not present

## 2021-10-01 DIAGNOSIS — T83511A Infection and inflammatory reaction due to indwelling urethral catheter, initial encounter: Secondary | ICD-10-CM | POA: Diagnosis not present

## 2021-10-01 DIAGNOSIS — I129 Hypertensive chronic kidney disease with stage 1 through stage 4 chronic kidney disease, or unspecified chronic kidney disease: Secondary | ICD-10-CM | POA: Diagnosis not present

## 2021-10-01 DIAGNOSIS — E039 Hypothyroidism, unspecified: Secondary | ICD-10-CM | POA: Diagnosis not present

## 2021-10-01 DIAGNOSIS — G9341 Metabolic encephalopathy: Secondary | ICD-10-CM | POA: Diagnosis not present

## 2021-10-01 DIAGNOSIS — I5022 Chronic systolic (congestive) heart failure: Secondary | ICD-10-CM | POA: Diagnosis not present

## 2021-10-04 DIAGNOSIS — I4811 Longstanding persistent atrial fibrillation: Secondary | ICD-10-CM | POA: Diagnosis not present

## 2021-10-04 DIAGNOSIS — I129 Hypertensive chronic kidney disease with stage 1 through stage 4 chronic kidney disease, or unspecified chronic kidney disease: Secondary | ICD-10-CM | POA: Diagnosis not present

## 2021-10-04 DIAGNOSIS — G9341 Metabolic encephalopathy: Secondary | ICD-10-CM | POA: Diagnosis not present

## 2021-10-04 DIAGNOSIS — T83511A Infection and inflammatory reaction due to indwelling urethral catheter, initial encounter: Secondary | ICD-10-CM | POA: Diagnosis not present

## 2021-10-04 DIAGNOSIS — E039 Hypothyroidism, unspecified: Secondary | ICD-10-CM | POA: Diagnosis not present

## 2021-10-04 DIAGNOSIS — I5022 Chronic systolic (congestive) heart failure: Secondary | ICD-10-CM | POA: Diagnosis not present

## 2021-10-04 DIAGNOSIS — N1831 Chronic kidney disease, stage 3a: Secondary | ICD-10-CM | POA: Diagnosis not present

## 2021-10-04 DIAGNOSIS — I255 Ischemic cardiomyopathy: Secondary | ICD-10-CM | POA: Diagnosis not present

## 2021-10-04 DIAGNOSIS — A419 Sepsis, unspecified organism: Secondary | ICD-10-CM | POA: Diagnosis not present

## 2021-10-08 DIAGNOSIS — E039 Hypothyroidism, unspecified: Secondary | ICD-10-CM | POA: Diagnosis not present

## 2021-10-08 DIAGNOSIS — I255 Ischemic cardiomyopathy: Secondary | ICD-10-CM | POA: Diagnosis not present

## 2021-10-08 DIAGNOSIS — A419 Sepsis, unspecified organism: Secondary | ICD-10-CM | POA: Diagnosis not present

## 2021-10-08 DIAGNOSIS — G9341 Metabolic encephalopathy: Secondary | ICD-10-CM | POA: Diagnosis not present

## 2021-10-08 DIAGNOSIS — R339 Retention of urine, unspecified: Secondary | ICD-10-CM | POA: Diagnosis not present

## 2021-10-08 DIAGNOSIS — I129 Hypertensive chronic kidney disease with stage 1 through stage 4 chronic kidney disease, or unspecified chronic kidney disease: Secondary | ICD-10-CM | POA: Diagnosis not present

## 2021-10-08 DIAGNOSIS — I4811 Longstanding persistent atrial fibrillation: Secondary | ICD-10-CM | POA: Diagnosis not present

## 2021-10-08 DIAGNOSIS — I5022 Chronic systolic (congestive) heart failure: Secondary | ICD-10-CM | POA: Diagnosis not present

## 2021-10-08 DIAGNOSIS — T83511A Infection and inflammatory reaction due to indwelling urethral catheter, initial encounter: Secondary | ICD-10-CM | POA: Diagnosis not present

## 2021-10-08 DIAGNOSIS — N1831 Chronic kidney disease, stage 3a: Secondary | ICD-10-CM | POA: Diagnosis not present

## 2021-10-08 DIAGNOSIS — I10 Essential (primary) hypertension: Secondary | ICD-10-CM | POA: Diagnosis not present

## 2021-10-08 DIAGNOSIS — M6281 Muscle weakness (generalized): Secondary | ICD-10-CM | POA: Diagnosis not present

## 2021-10-08 DIAGNOSIS — I482 Chronic atrial fibrillation, unspecified: Secondary | ICD-10-CM | POA: Diagnosis not present

## 2021-10-09 ENCOUNTER — Encounter: Payer: Self-pay | Admitting: Internal Medicine

## 2021-10-09 NOTE — Patient Instructions (Addendum)
     Blood work was ordered.     Medications changes include :      Your prescription(s) have been sent to your pharmacy.    A referral was ordered for XX.     Someone from that office will call you to schedule an appointment.    Return in about 6 months (around 04/12/2022) for follow up.

## 2021-10-09 NOTE — Progress Notes (Signed)
Subjective:    Patient ID: Yvonne Bailey, female    DOB: 09-04-1940, 81 y.o.   MRN: [id number]     HPI Yvonne Bailey is here for follow up from rehab    Hospitalized6/25/2023-09/05/2021 for sepsis secondary to catheter associated UTI.  Associated acute metabolic encephalopathy.  Discharged to SNF.  Treated in the hospital with Rocephin that was changed to Keflex.  Completed 2 weeks of antibiotics.  Mental status improved.  CT head was negative for acute abnormalities.  She had a catheter because of chronic urinary retention from cause unknown.  Foley was replaced.  Advised that she could be given a voiding trial after 2 weeks at rehab.  Advised that if that was not successful Foley would be reinserted and she should follow-up with urology.  Lasix and metoprolol discontinued.  Chronic heart failure, A-fib, hyperlipidemia, hypothyroidism stable.  Because she was deconditioned and she was sent to rehab.   ? Foley in place - urinary retention  Medications and allergies reviewed with patient and updated if appropriate.  Current Outpatient Medications on File Prior to Visit  Medication Sig Dispense Refill   aspirin EC 81 MG tablet Take 1 tablet (81 mg total) by mouth daily. 90 tablet 3   atorvastatin (LIPITOR) 80 MG tablet TAKE 1 TABLET(80 MG) BY MOUTH DAILY (Patient taking differently: Take 80 mg by mouth daily.) 90 tablet 1   benazepril (LOTENSIN) 20 MG tablet TAKE 1 AND 1/2 TABLETS(30 MG) BY MOUTH DAILY (Patient taking differently: Take 30 mg by mouth daily.) 135 tablet 1   ELIQUIS 5 MG TABS tablet TAKE 1 TABLET(5 MG) BY MOUTH TWICE DAILY 180 tablet 0   estradiol (ESTRACE) 0.1 MG/GM vaginal cream Place vaginally.     levothyroxine (SYNTHROID) 88 MCG tablet TAKE 1 TABLET(88 MCG) BY MOUTH DAILY (Patient taking differently: Take 88 mcg by mouth daily before breakfast.) 90 tablet 1   MYRBETRIQ 25 MG TB24 tablet TAKE 1 TABLET(25 MG) BY MOUTH DAILY (Patient taking differently: Take 25 mg  by mouth daily.) 30 tablet 5   nitroGLYCERIN (NITROSTAT) 0.4 MG SL tablet Place 1 tablet (0.4 mg total) under the tongue every 5 (five) minutes as needed for chest pain. 25 tablet 1   polyethylene glycol (MIRALAX) 17 g packet Take one dose 3 times a day until bowel clear, maximum of 3 consecutive days. (Patient taking differently: Take 17 g by mouth daily as needed for mild constipation.) 9 each 0   sertraline (ZOLOFT) 100 MG tablet Take 1 tablet (100 mg total) by mouth daily. 90 tablet 3   spironolactone (ALDACTONE) 25 MG tablet TAKE 1 TABLET(25 MG) BY MOUTH DAILY 90 tablet 1   No current facility-administered medications on file prior to visit.     Review of Systems     Objective:  There were no vitals filed for this visit. BP Readings from Last 3 Encounters:  09/05/21 132/71  04/10/21 126/68  02/13/21 (!) 142/75   Wt Readings from Last 3 Encounters:  09/05/21 156 lb 1.4 oz (70.8 kg)  04/10/21 161 lb (73 kg)  11/15/20 163 lb (73.9 kg)   There is no height or weight on file to calculate BMI.    Physical Exam     Lab Results  Component Value Date   WBC 9.8 09/05/2021   HGB 12.4 09/05/2021   HCT 38.1 09/05/2021   PLT 280 09/05/2021   GLUCOSE 91 09/05/2021   CHOL 100 04/10/2021   TRIG 46.0 04/10/2021  HDL 39.10 04/10/2021   LDLCALC 52 04/10/2021   ALT 27 09/02/2021   AST 44 (H) 09/02/2021   NA 139 09/05/2021   K 3.8 09/05/2021   CL 112 (H) 09/05/2021   CREATININE 0.92 09/05/2021   BUN 24 (H) 09/05/2021   CO2 20 (L) 09/05/2021   TSH 7.484 (H) 09/03/2021   INR 1.8 (H) 09/02/2021   HGBA1C 5.2 04/10/2021     Assessment & Plan:    See Problem List for Assessment and Plan of chronic medical problems.   This encounter was created in error - please disregard.

## 2021-10-10 ENCOUNTER — Encounter: Payer: Medicare HMO | Admitting: Internal Medicine

## 2021-10-10 DIAGNOSIS — I4819 Other persistent atrial fibrillation: Secondary | ICD-10-CM

## 2021-10-10 DIAGNOSIS — R7303 Prediabetes: Secondary | ICD-10-CM

## 2021-10-10 DIAGNOSIS — I69320 Aphasia following cerebral infarction: Secondary | ICD-10-CM

## 2021-10-10 DIAGNOSIS — I10 Essential (primary) hypertension: Secondary | ICD-10-CM

## 2021-10-10 DIAGNOSIS — N390 Urinary tract infection, site not specified: Secondary | ICD-10-CM

## 2021-10-10 DIAGNOSIS — N1831 Chronic kidney disease, stage 3a: Secondary | ICD-10-CM

## 2021-10-10 DIAGNOSIS — I5022 Chronic systolic (congestive) heart failure: Secondary | ICD-10-CM

## 2021-10-10 DIAGNOSIS — F32A Depression, unspecified: Secondary | ICD-10-CM

## 2021-10-10 DIAGNOSIS — E038 Other specified hypothyroidism: Secondary | ICD-10-CM

## 2021-10-10 DIAGNOSIS — E785 Hyperlipidemia, unspecified: Secondary | ICD-10-CM

## 2021-10-11 ENCOUNTER — Telehealth: Payer: Self-pay | Admitting: Internal Medicine

## 2021-10-11 NOTE — Telephone Encounter (Signed)
noted 

## 2021-10-11 NOTE — Telephone Encounter (Signed)
FYI - Nurse thinks that son thinks that patient can do more than she can actually do.  Son thinks that she is capable of doing her own eye drops, etc.  Nurse thinks that son has unrealistic expectations of  his mom.  Just wanted Dr. Lawerance Bach to be aware.

## 2021-10-16 ENCOUNTER — Telehealth: Payer: Self-pay

## 2021-10-16 NOTE — Telephone Encounter (Signed)
Message left for Yvonne Bailey today with verbal.

## 2021-10-16 NOTE — Telephone Encounter (Signed)
Yvonne Bailey with [facility] is calling asking for verbals for [facility] speech therapy 1x week 4 weeks  Yvonne Bailey can be reached at [phone].

## 2021-10-16 NOTE — Telephone Encounter (Signed)
Okay for orders? 

## 2021-10-17 ENCOUNTER — Telehealth: Payer: Self-pay

## 2021-10-17 NOTE — Telephone Encounter (Signed)
Stephanie with [facility] is asking for verbal orders for OT 1x a week for 6 weeks.  Judeth Cornfield can be reached at [phone]

## 2021-10-18 NOTE — Telephone Encounter (Signed)
Ok for orders   No showed her appt this month - due for f/u

## 2021-10-18 NOTE — Telephone Encounter (Signed)
Verbals given.  Attempted to reach patient on both numbers listed to r/s appointment but no answer and not able to leave a VM.

## 2021-11-01 ENCOUNTER — Telehealth: Payer: Self-pay

## 2021-11-01 NOTE — Telephone Encounter (Signed)
noted 

## 2021-11-01 NOTE — Telephone Encounter (Signed)
Yvonne Bailey is calling to report that pt had a fall on Tuesday 10/30/21 with no reported injuries.  FYI

## 2021-11-02 DIAGNOSIS — I2510 Atherosclerotic heart disease of native coronary artery without angina pectoris: Secondary | ICD-10-CM | POA: Diagnosis not present

## 2021-11-02 DIAGNOSIS — I639 Cerebral infarction, unspecified: Secondary | ICD-10-CM | POA: Diagnosis not present

## 2021-11-05 NOTE — Progress Notes (Unsigned)
Subjective:    Patient ID: Yvonne Bailey, female    DOB: [birth year], 81 y.o.   MRN: [id number]     HPI Yvonne Bailey is here for follow up from hosp,rehab  She is here with a caregiver.  In three days a week.   She is not taking her medication every day.  She eats good.  She sleep good.   Admitted 6/26-6/28 UTI  with sepsis secondary to catheter associated UTI, acute metabolic enceopahlopathy due to sepsis.  This was successfully treated and she went to rehab and is currently at home.  Overactive bladder urinary incontinence -she had a Foley catheter at 1 point which is what caused the infection.   She does have chronic overactive bladder and incontinence.  She wears a diaper during the day and night.  She is not good about getting up and changing it and this has led to skin issues.  She has no concerns.  The caregiver is not aware of any concerns that the family has.   Medications and allergies reviewed with patient and updated if appropriate.  Current Outpatient Medications on File Prior to Visit  Medication Sig Dispense Refill   aspirin EC 81 MG tablet Take 1 tablet (81 mg total) by mouth daily. 90 tablet 3   atorvastatin (LIPITOR) 80 MG tablet TAKE 1 TABLET(80 MG) BY MOUTH DAILY (Patient taking differently: Take 80 mg by mouth daily.) 90 tablet 1   benazepril (LOTENSIN) 20 MG tablet TAKE 1 AND 1/2 TABLETS(30 MG) BY MOUTH DAILY (Patient taking differently: Take 30 mg by mouth daily.) 135 tablet 1   ELIQUIS 5 MG TABS tablet TAKE 1 TABLET(5 MG) BY MOUTH TWICE DAILY 180 tablet 0   estradiol (ESTRACE) 0.1 MG/GM vaginal cream Place vaginally.     levothyroxine (SYNTHROID) 88 MCG tablet TAKE 1 TABLET(88 MCG) BY MOUTH DAILY (Patient taking differently: Take 88 mcg by mouth daily before breakfast.) 90 tablet 1   MYRBETRIQ 25 MG TB24 tablet TAKE 1 TABLET(25 MG) BY MOUTH DAILY (Patient taking differently: Take 25 mg by mouth daily.) 30 tablet 5   nitroGLYCERIN (NITROSTAT) 0.4 MG SL  tablet Place 1 tablet (0.4 mg total) under the tongue every 5 (five) minutes as needed for chest pain. 25 tablet 1   polyethylene glycol (MIRALAX) 17 g packet Take one dose 3 times a day until bowel clear, maximum of 3 consecutive days. (Patient taking differently: Take 17 g by mouth daily as needed for mild constipation.) 9 each 0   sertraline (ZOLOFT) 100 MG tablet Take 1 tablet (100 mg total) by mouth daily. 90 tablet 3   spironolactone (ALDACTONE) 25 MG tablet TAKE 1 TABLET(25 MG) BY MOUTH DAILY 90 tablet 1   No current facility-administered medications on file prior to visit.     Review of Systems  Constitutional:  Negative for appetite change, chills and fever.  Respiratory:  Negative for cough, shortness of breath and wheezing.   Cardiovascular:  Positive for leg swelling. Negative for chest pain and palpitations.  Gastrointestinal:  Negative for abdominal pain, blood in stool, constipation, diarrhea and nausea.       No gerd  Genitourinary:  Negative for dysuria and hematuria.       Incontinenece  Musculoskeletal:  Negative for arthralgias and back pain.  Neurological:  Negative for light-headedness and headaches.  Psychiatric/Behavioral:  Negative for dysphoric mood. The patient is not nervous/anxious.        Objective:   Vitals:   11/06/21  1553  BP: (!) 142/82  Pulse: 96  Temp: 98 F (36.7 C)  SpO2: 97%   BP Readings from Last 3 Encounters:  11/06/21 (!) 142/82  09/05/21 132/71  04/10/21 126/68   Wt Readings from Last 3 Encounters:  11/06/21 171 lb (77.6 kg)  09/05/21 156 lb 1.4 oz (70.8 kg)  04/10/21 161 lb (73 kg)   Body mass index is 26.78 kg/m.    Physical Exam Constitutional:      General: She is not in acute distress.    Appearance: Normal appearance.  HENT:     Head: Normocephalic and atraumatic.  Eyes:     Conjunctiva/sclera: Conjunctivae normal.  Cardiovascular:     Rate and Rhythm: Normal rate. Rhythm irregular.     Heart sounds: Normal  heart sounds. No murmur (1/6 systolic) heard. Pulmonary:     Effort: Pulmonary effort is normal. No respiratory distress.     Breath sounds: Normal breath sounds. No wheezing.  Musculoskeletal:     Cervical back: Neck supple.     Right lower leg: Edema (1+ pitting) present.     Left lower leg: Edema (1+ pitting) present.  Lymphadenopathy:     Cervical: No cervical adenopathy.  Skin:    General: Skin is warm and dry.     Findings: No rash.  Neurological:     Mental Status: She is alert. Mental status is at baseline.  Psychiatric:        Mood and Affect: Mood normal.        Behavior: Behavior normal.        Lab Results  Component Value Date   WBC 9.8 09/05/2021   HGB 12.4 09/05/2021   HCT 38.1 09/05/2021   PLT 280 09/05/2021   GLUCOSE 91 09/05/2021   CHOL 100 04/10/2021   TRIG 46.0 04/10/2021   HDL 39.10 04/10/2021   LDLCALC 52 04/10/2021   ALT 27 09/02/2021   AST 44 (H) 09/02/2021   NA 139 09/05/2021   K 3.8 09/05/2021   CL 112 (H) 09/05/2021   CREATININE 0.92 09/05/2021   BUN 24 (H) 09/05/2021   CO2 20 (L) 09/05/2021   TSH 7.484 (H) 09/03/2021   INR 1.8 (H) 09/02/2021   HGBA1C 5.2 04/10/2021     Assessment & Plan:    See Problem List for Assessment and Plan of chronic medical problems.

## 2021-11-06 ENCOUNTER — Ambulatory Visit (INDEPENDENT_AMBULATORY_CARE_PROVIDER_SITE_OTHER): Payer: Medicare HMO | Admitting: Internal Medicine

## 2021-11-06 ENCOUNTER — Encounter: Payer: Self-pay | Admitting: Internal Medicine

## 2021-11-06 VITALS — BP 142/82 | HR 96 | Temp 98.0°F | Ht 67.0 in | Wt 171.0 lb

## 2021-11-06 DIAGNOSIS — F32A Depression, unspecified: Secondary | ICD-10-CM

## 2021-11-06 DIAGNOSIS — F419 Anxiety disorder, unspecified: Secondary | ICD-10-CM

## 2021-11-06 DIAGNOSIS — E038 Other specified hypothyroidism: Secondary | ICD-10-CM | POA: Diagnosis not present

## 2021-11-06 DIAGNOSIS — I4819 Other persistent atrial fibrillation: Secondary | ICD-10-CM | POA: Diagnosis not present

## 2021-11-06 DIAGNOSIS — I5022 Chronic systolic (congestive) heart failure: Secondary | ICD-10-CM

## 2021-11-06 DIAGNOSIS — I10 Essential (primary) hypertension: Secondary | ICD-10-CM | POA: Diagnosis not present

## 2021-11-06 DIAGNOSIS — N3281 Overactive bladder: Secondary | ICD-10-CM | POA: Diagnosis not present

## 2021-11-06 DIAGNOSIS — E872 Acidosis, unspecified: Secondary | ICD-10-CM

## 2021-11-06 DIAGNOSIS — E785 Hyperlipidemia, unspecified: Secondary | ICD-10-CM

## 2021-11-06 DIAGNOSIS — N1831 Chronic kidney disease, stage 3a: Secondary | ICD-10-CM

## 2021-11-06 LAB — CBC WITH DIFFERENTIAL/PLATELET
Basophils Absolute: 0.1 10*3/uL (ref 0.0–0.1)
Basophils Relative: 0.8 % (ref 0.0–3.0)
Eosinophils Absolute: 0.2 10*3/uL (ref 0.0–0.7)
Eosinophils Relative: 3.1 % (ref 0.0–5.0)
HCT: 34.5 % — ABNORMAL LOW (ref 36.0–46.0)
Hemoglobin: 11.3 g/dL — ABNORMAL LOW (ref 12.0–15.0)
Lymphocytes Relative: 22.5 % (ref 12.0–46.0)
Lymphs Abs: 1.7 10*3/uL (ref 0.7–4.0)
MCHC: 32.8 g/dL (ref 30.0–36.0)
MCV: 98.9 fl (ref 78.0–100.0)
Monocytes Absolute: 0.8 10*3/uL (ref 0.1–1.0)
Monocytes Relative: 10.1 % (ref 3.0–12.0)
Neutro Abs: 4.8 10*3/uL (ref 1.4–7.7)
Neutrophils Relative %: 63.5 % (ref 43.0–77.0)
Platelets: 220 10*3/uL (ref 150.0–400.0)
RBC: 3.49 Mil/uL — ABNORMAL LOW (ref 3.87–5.11)
RDW: 14.2 % (ref 11.5–15.5)
WBC: 7.6 10*3/uL (ref 4.0–10.5)

## 2021-11-06 LAB — TSH: TSH: 2.88 u[IU]/mL (ref 0.35–5.50)

## 2021-11-06 LAB — HEMOGLOBIN A1C: Hgb A1c MFr Bld: 5.3 % (ref 4.6–6.5)

## 2021-11-06 NOTE — Assessment & Plan Note (Signed)
Chronic Regular exercise and healthy diet encouraged Check lipid panel  Continue atorvastatin 80 mg daily 

## 2021-11-06 NOTE — Assessment & Plan Note (Signed)
Chronic CMP, CBC 

## 2021-11-06 NOTE — Assessment & Plan Note (Signed)
Chronic Controlled, Stable Continue sertraline 100 mg daily 

## 2021-11-06 NOTE — Assessment & Plan Note (Signed)
Chronic Has overactive bladder, urgency and incontinence She does wear diapers during the day and night and is not always good about getting up and changing which has led to skin issues Stressed that she needs to be changing yourself on a regular basis Currently taking Myrbetriq 25 mg daily-we will continue

## 2021-11-06 NOTE — Assessment & Plan Note (Signed)
Chronic Difficult to say how well her blood pressure is controlled because she is not always taking her medication on a regular basis Continue benazepril 30 mg daily, spironolactone 25 mg daily-stressed compliance CMP, CBC

## 2021-11-06 NOTE — Assessment & Plan Note (Signed)
Chronic Not taking her medication daily She does have 1+ pitting edema bilateral lower extremities, but no shortness of breath although very sedentary Stressed compliance with medications Continue benazepril 30 mg daily, spironolactone 25 mg daily Would not Smitty well with any additional water pills because of urinary incontinence issues surrounding that

## 2021-11-06 NOTE — Patient Instructions (Addendum)
     Blood work was ordered.     Medications changes include :   none    Return in about 6 months (around 05/09/2022) for follow up.

## 2021-11-06 NOTE — Assessment & Plan Note (Signed)
Chronic Not taking medication on a daily basis We will check TSH Continue levothyroxine 88 mcg daily for now-we will adjust medication, but this will be difficult since she is not consistently taking her medication

## 2021-11-06 NOTE — Assessment & Plan Note (Signed)
Chronic Irregular rhythm on exam Unfortunately she is not taking her medication on a daily basis and sometimes forgets-stressed compliance and increased risk of stroke if she is not taking her medication as prescribed Continue aspirin 81 mg daily, Eliquis 5 mg twice daily

## 2021-11-07 LAB — COMPREHENSIVE METABOLIC PANEL
ALT: 19 U/L (ref 0–35)
AST: 23 U/L (ref 0–37)
Albumin: 3.9 g/dL (ref 3.5–5.2)
Alkaline Phosphatase: 102 U/L (ref 39–117)
BUN: 14 mg/dL (ref 6–23)
CO2: 25 mEq/L (ref 19–32)
Calcium: 8.9 mg/dL (ref 8.4–10.5)
Chloride: 108 mEq/L (ref 96–112)
Creatinine, Ser: 0.95 mg/dL (ref 0.40–1.20)
GFR: 56.26 mL/min — ABNORMAL LOW (ref >60.00)
Glucose, Bld: 82 mg/dL (ref 70–99)
Potassium: 3.5 mEq/L (ref 3.5–5.1)
Sodium: 143 mEq/L (ref 135–145)
Total Bilirubin: 0.9 mg/dL (ref 0.2–1.2)
Total Protein: 7.3 g/dL (ref 6.0–8.3)

## 2021-11-07 LAB — LIPID PANEL
Cholesterol: 108 mg/dL (ref 0–200)
HDL: 44 mg/dL (ref >39.00)
LDL Cholesterol: 52 mg/dL (ref 0–99)
NonHDL: 64.38
Total CHOL/HDL Ratio: 2
Triglycerides: 61 mg/dL (ref 0.0–149.0)
VLDL: 12.2 mg/dL (ref 0.0–40.0)

## 2021-11-16 ENCOUNTER — Telehealth: Payer: Self-pay | Admitting: Internal Medicine

## 2021-11-16 ENCOUNTER — Other Ambulatory Visit: Payer: Self-pay

## 2021-11-16 ENCOUNTER — Inpatient Hospital Stay (HOSPITAL_COMMUNITY)
Admission: EM | Admit: 2021-11-16 | Discharge: 2021-11-20 | DRG: 689 | Disposition: A | Discharge status: Skilled Nursing Facility | Payer: Medicare HMO | Attending: Internal Medicine | Admitting: Internal Medicine

## 2021-11-16 ENCOUNTER — Emergency Department (HOSPITAL_COMMUNITY): Payer: Medicare HMO

## 2021-11-16 ENCOUNTER — Encounter (HOSPITAL_COMMUNITY): Payer: Self-pay | Admitting: Emergency Medicine

## 2021-11-16 DIAGNOSIS — I69320 Aphasia following cerebral infarction: Secondary | ICD-10-CM | POA: Diagnosis not present

## 2021-11-16 DIAGNOSIS — E039 Hypothyroidism, unspecified: Secondary | ICD-10-CM | POA: Diagnosis present

## 2021-11-16 DIAGNOSIS — E785 Hyperlipidemia, unspecified: Secondary | ICD-10-CM | POA: Diagnosis present

## 2021-11-16 DIAGNOSIS — I2510 Atherosclerotic heart disease of native coronary artery without angina pectoris: Secondary | ICD-10-CM | POA: Diagnosis not present

## 2021-11-16 DIAGNOSIS — I10 Essential (primary) hypertension: Secondary | ICD-10-CM | POA: Diagnosis not present

## 2021-11-16 DIAGNOSIS — I82409 Acute embolism and thrombosis of unspecified deep veins of unspecified lower extremity: Secondary | ICD-10-CM | POA: Diagnosis present

## 2021-11-16 DIAGNOSIS — N1831 Chronic kidney disease, stage 3a: Secondary | ICD-10-CM | POA: Diagnosis present

## 2021-11-16 DIAGNOSIS — I4811 Longstanding persistent atrial fibrillation: Secondary | ICD-10-CM | POA: Diagnosis not present

## 2021-11-16 DIAGNOSIS — I255 Ischemic cardiomyopathy: Secondary | ICD-10-CM | POA: Diagnosis present

## 2021-11-16 DIAGNOSIS — F419 Anxiety disorder, unspecified: Secondary | ICD-10-CM | POA: Diagnosis present

## 2021-11-16 DIAGNOSIS — Z86718 Personal history of other venous thrombosis and embolism: Secondary | ICD-10-CM | POA: Diagnosis not present

## 2021-11-16 DIAGNOSIS — I4891 Unspecified atrial fibrillation: Secondary | ICD-10-CM | POA: Diagnosis present

## 2021-11-16 DIAGNOSIS — B964 Proteus (mirabilis) (morganii) as the cause of diseases classified elsewhere: Secondary | ICD-10-CM | POA: Diagnosis not present

## 2021-11-16 DIAGNOSIS — R5381 Other malaise: Secondary | ICD-10-CM | POA: Diagnosis present

## 2021-11-16 DIAGNOSIS — N390 Urinary tract infection, site not specified: Secondary | ICD-10-CM | POA: Diagnosis not present

## 2021-11-16 DIAGNOSIS — N183 Chronic kidney disease, stage 3 unspecified: Secondary | ICD-10-CM | POA: Diagnosis present

## 2021-11-16 DIAGNOSIS — I252 Old myocardial infarction: Secondary | ICD-10-CM

## 2021-11-16 DIAGNOSIS — G9341 Metabolic encephalopathy: Secondary | ICD-10-CM

## 2021-11-16 DIAGNOSIS — Z87891 Personal history of nicotine dependence: Secondary | ICD-10-CM | POA: Diagnosis not present

## 2021-11-16 DIAGNOSIS — N3000 Acute cystitis without hematuria: Secondary | ICD-10-CM | POA: Diagnosis present

## 2021-11-16 DIAGNOSIS — E876 Hypokalemia: Secondary | ICD-10-CM | POA: Diagnosis present

## 2021-11-16 DIAGNOSIS — Z823 Family history of stroke: Secondary | ICD-10-CM

## 2021-11-16 DIAGNOSIS — Z7401 Bed confinement status: Secondary | ICD-10-CM | POA: Diagnosis not present

## 2021-11-16 DIAGNOSIS — R531 Weakness: Secondary | ICD-10-CM | POA: Diagnosis not present

## 2021-11-16 DIAGNOSIS — R2689 Other abnormalities of gait and mobility: Secondary | ICD-10-CM | POA: Diagnosis not present

## 2021-11-16 DIAGNOSIS — Z8249 Family history of ischemic heart disease and other diseases of the circulatory system: Secondary | ICD-10-CM

## 2021-11-16 DIAGNOSIS — M6281 Muscle weakness (generalized): Secondary | ICD-10-CM | POA: Diagnosis not present

## 2021-11-16 DIAGNOSIS — N179 Acute kidney failure, unspecified: Secondary | ICD-10-CM | POA: Diagnosis not present

## 2021-11-16 DIAGNOSIS — I4819 Other persistent atrial fibrillation: Secondary | ICD-10-CM | POA: Diagnosis not present

## 2021-11-16 DIAGNOSIS — Z79890 Hormone replacement therapy (postmenopausal): Secondary | ICD-10-CM

## 2021-11-16 DIAGNOSIS — I129 Hypertensive chronic kidney disease with stage 1 through stage 4 chronic kidney disease, or unspecified chronic kidney disease: Secondary | ICD-10-CM | POA: Diagnosis present

## 2021-11-16 DIAGNOSIS — R4182 Altered mental status, unspecified: Secondary | ICD-10-CM | POA: Diagnosis not present

## 2021-11-16 DIAGNOSIS — I739 Peripheral vascular disease, unspecified: Secondary | ICD-10-CM | POA: Diagnosis not present

## 2021-11-16 DIAGNOSIS — F32A Depression, unspecified: Secondary | ICD-10-CM | POA: Diagnosis not present

## 2021-11-16 DIAGNOSIS — R7303 Prediabetes: Secondary | ICD-10-CM | POA: Diagnosis present

## 2021-11-16 DIAGNOSIS — I69828 Other speech and language deficits following other cerebrovascular disease: Secondary | ICD-10-CM | POA: Diagnosis not present

## 2021-11-16 DIAGNOSIS — I639 Cerebral infarction, unspecified: Secondary | ICD-10-CM | POA: Diagnosis not present

## 2021-11-16 DIAGNOSIS — Z79899 Other long term (current) drug therapy: Secondary | ICD-10-CM

## 2021-11-16 DIAGNOSIS — Z7982 Long term (current) use of aspirin: Secondary | ICD-10-CM

## 2021-11-16 DIAGNOSIS — Z833 Family history of diabetes mellitus: Secondary | ICD-10-CM

## 2021-11-16 DIAGNOSIS — R2681 Unsteadiness on feet: Secondary | ICD-10-CM | POA: Diagnosis not present

## 2021-11-16 DIAGNOSIS — Z7901 Long term (current) use of anticoagulants: Secondary | ICD-10-CM | POA: Diagnosis not present

## 2021-11-16 DIAGNOSIS — N3001 Acute cystitis with hematuria: Principal | ICD-10-CM | POA: Diagnosis present

## 2021-11-16 DIAGNOSIS — Z90710 Acquired absence of both cervix and uterus: Secondary | ICD-10-CM

## 2021-11-16 DIAGNOSIS — M2550 Pain in unspecified joint: Secondary | ICD-10-CM | POA: Diagnosis not present

## 2021-11-16 DIAGNOSIS — A419 Sepsis, unspecified organism: Secondary | ICD-10-CM | POA: Diagnosis not present

## 2021-11-16 LAB — I-STAT VENOUS BLOOD GAS, ED
Acid-Base Excess: 4 mmol/L — ABNORMAL HIGH (ref 0.0–2.0)
Bicarbonate: 27.7 mmol/L (ref 20.0–28.0)
Calcium, Ion: 1 mmol/L — ABNORMAL LOW (ref 1.15–1.40)
HCT: 38 % (ref 36.0–46.0)
Hemoglobin: 12.9 g/dL (ref 12.0–15.0)
O2 Saturation: 74 %
Potassium: 3.3 mmol/L — ABNORMAL LOW (ref 3.5–5.1)
Sodium: 141 mmol/L (ref 135–145)
TCO2: 29 mmol/L (ref 22–32)
pCO2, Ven: 37.6 mmHg — ABNORMAL LOW (ref 44–60)
pH, Ven: 7.474 — ABNORMAL HIGH (ref 7.25–7.43)
pO2, Ven: 36 mmHg (ref 32–45)

## 2021-11-16 LAB — CBC WITH DIFFERENTIAL/PLATELET
Abs Immature Granulocytes: 0.03 10*3/uL (ref 0.00–0.07)
Basophils Absolute: 0.1 10*3/uL (ref 0.0–0.1)
Basophils Relative: 1 %
Eosinophils Absolute: 0.2 10*3/uL (ref 0.0–0.5)
Eosinophils Relative: 2 %
HCT: 40.7 % (ref 36.0–46.0)
Hemoglobin: 12.8 g/dL (ref 12.0–15.0)
Immature Granulocytes: 0 %
Lymphocytes Relative: 17 %
Lymphs Abs: 1.8 10*3/uL (ref 0.7–4.0)
MCH: 32.6 pg (ref 26.0–34.0)
MCHC: 31.4 g/dL (ref 30.0–36.0)
MCV: 103.6 fL — ABNORMAL HIGH (ref 80.0–100.0)
Monocytes Absolute: 1.1 10*3/uL — ABNORMAL HIGH (ref 0.1–1.0)
Monocytes Relative: 10 %
Neutro Abs: 7.6 10*3/uL (ref 1.7–7.7)
Neutrophils Relative %: 70 %
Platelets: 277 10*3/uL (ref 150–400)
RBC: 3.93 MIL/uL (ref 3.87–5.11)
RDW: 13.9 % (ref 11.5–15.5)
WBC: 10.8 10*3/uL — ABNORMAL HIGH (ref 4.0–10.5)
nRBC: 0 % (ref 0.0–0.2)

## 2021-11-16 LAB — URINALYSIS, ROUTINE W REFLEX MICROSCOPIC
Bilirubin Urine: NEGATIVE
Glucose, UA: NEGATIVE mg/dL
Ketones, ur: 5 mg/dL — AB
Nitrite: NEGATIVE
Protein, ur: 100 mg/dL — AB
RBC / HPF: >50 RBC/hpf — ABNORMAL HIGH (ref 0–5)
Specific Gravity, Urine: 1.017 (ref 1.005–1.030)
WBC, UA: >50 WBC/hpf — ABNORMAL HIGH (ref 0–5)
pH: 7 (ref 5.0–8.0)

## 2021-11-16 LAB — COMPREHENSIVE METABOLIC PANEL
ALT: 16 U/L (ref 0–44)
AST: 31 U/L (ref 15–41)
Albumin: 3.6 g/dL (ref 3.5–5.0)
Alkaline Phosphatase: 97 U/L (ref 38–126)
Anion gap: 13 (ref 5–15)
BUN: 24 mg/dL — ABNORMAL HIGH (ref 8–23)
CO2: 24 mmol/L (ref 22–32)
Calcium: 9 mg/dL (ref 8.9–10.3)
Chloride: 106 mmol/L (ref 98–111)
Creatinine, Ser: 1.26 mg/dL — ABNORMAL HIGH (ref 0.44–1.00)
GFR, Estimated: 43 mL/min — ABNORMAL LOW (ref >60)
Glucose, Bld: 97 mg/dL (ref 70–99)
Potassium: 3.6 mmol/L (ref 3.5–5.1)
Sodium: 143 mmol/L (ref 135–145)
Total Bilirubin: 1.1 mg/dL (ref 0.3–1.2)
Total Protein: 7.6 g/dL (ref 6.5–8.1)

## 2021-11-16 LAB — TSH: TSH: 11.897 u[IU]/mL — ABNORMAL HIGH (ref 0.350–4.500)

## 2021-11-16 LAB — CBG MONITORING, ED: Glucose-Capillary: 96 mg/dL (ref 70–99)

## 2021-11-16 LAB — LACTIC ACID, PLASMA: Lactic Acid, Venous: 1.1 mmol/L (ref 0.5–1.9)

## 2021-11-16 LAB — AMMONIA: Ammonia: 23 umol/L (ref 9–35)

## 2021-11-16 LAB — CORTISOL: Cortisol, Plasma: 11.7 ug/dL

## 2021-11-16 LAB — T4, FREE: Free T4: 0.88 ng/dL (ref 0.61–1.12)

## 2021-11-16 MED ORDER — SODIUM CHLORIDE 0.9 % IV BOLUS
1000.0000 mL | Freq: Once | INTRAVENOUS | Status: AC
  Administered 2021-11-16: 1000 mL via INTRAVENOUS

## 2021-11-16 MED ORDER — SODIUM CHLORIDE 0.9 % IV SOLN
2.0000 g | Freq: Once | INTRAVENOUS | Status: AC
  Administered 2021-11-16: 2 g via INTRAVENOUS
  Filled 2021-11-16: qty 20

## 2021-11-16 MED ORDER — APIXABAN 2.5 MG PO TABS
2.5000 mg | ORAL_TABLET | Freq: Two times a day (BID) | ORAL | Status: DC
  Administered 2021-11-16 – 2021-11-20 (×8): 2.5 mg via ORAL
  Filled 2021-11-16 (×8): qty 1

## 2021-11-16 MED ORDER — ACETAMINOPHEN 650 MG RE SUPP: 650.0000 mg | Freq: Four times a day (QID) | RECTAL | Status: DC | PRN

## 2021-11-16 MED ORDER — ONDANSETRON HCL 4 MG PO TABS: 4.0000 mg | ORAL_TABLET | Freq: Four times a day (QID) | ORAL | Status: DC | PRN

## 2021-11-16 MED ORDER — SODIUM CHLORIDE 0.9 % IV SOLN
1.0000 g | INTRAVENOUS | Status: DC
  Administered 2021-11-17 – 2021-11-19 (×3): 1 g via INTRAVENOUS
  Filled 2021-11-16 (×4): qty 10

## 2021-11-16 MED ORDER — ONDANSETRON HCL 4 MG/2ML IJ SOLN: 4.0000 mg | Freq: Four times a day (QID) | INTRAMUSCULAR | Status: DC | PRN

## 2021-11-16 MED ORDER — ACETAMINOPHEN 325 MG PO TABS: 650.0000 mg | ORAL_TABLET | Freq: Four times a day (QID) | ORAL | Status: DC | PRN

## 2021-11-16 NOTE — ED Provider Notes (Signed)
[facility] EMERGENCY DEPARTMENT Provider Note   CSN: [id number] Arrival date & time: 11/16/21  1433     History  Chief Complaint  Patient presents with   Altered Mental Status    Yvonne Bailey is a 81 y.o. female.  81 yo F with a chief complaints of altered mental status.  This was reported to the nurse. Patient denies any complaints. Level V caveat AMS.   Altered Mental Status      Home Medications Prior to Admission medications   Medication Sig Start Date End Date Taking? Authorizing Provider  aspirin EC 81 MG tablet Take 1 tablet (81 mg total) by mouth daily. 04/21/17   Dyann Kief, PA-C  atorvastatin (LIPITOR) 80 MG tablet TAKE 1 TABLET(80 MG) BY MOUTH DAILY Patient taking differently: Take 80 mg by mouth daily. 07/30/21   Burns, Bobette Mo, MD  benazepril (LOTENSIN) 20 MG tablet TAKE 1 AND 1/2 TABLETS(30 MG) BY MOUTH DAILY Patient taking differently: Take 30 mg by mouth daily. 02/06/21   Burns, Bobette Mo, MD  ELIQUIS 5 MG TABS tablet TAKE 1 TABLET(5 MG) BY MOUTH TWICE DAILY 09/17/21   Pincus Sanes, MD  estradiol (ESTRACE) 0.1 MG/GM vaginal cream Place vaginally. 01/15/21   [provider]  levothyroxine (SYNTHROID) 88 MCG tablet TAKE 1 TABLET(88 MCG) BY MOUTH DAILY Patient taking differently: Take 88 mcg by mouth daily before breakfast. 08/07/21   Burns, Bobette Mo, MD  MYRBETRIQ 25 MG TB24 tablet TAKE 1 TABLET(25 MG) BY MOUTH DAILY Patient taking differently: Take 25 mg by mouth daily. 04/26/21   Pincus Sanes, MD  nitroGLYCERIN (NITROSTAT) 0.4 MG SL tablet Place 1 tablet (0.4 mg total) under the tongue every 5 (five) minutes as needed for chest pain. 09/08/15   Little Ishikawa, NP  polyethylene glycol (MIRALAX) 17 g packet Take one dose 3 times a day until bowel clear, maximum of 3 consecutive days. Patient taking differently: Take 17 g by mouth daily as needed for mild constipation. 10/30/20   Elpidio Anis, PA-C  sertraline (ZOLOFT) 100 MG  tablet Take 1 tablet (100 mg total) by mouth daily. 05/24/21   Pincus Sanes, MD  spironolactone (ALDACTONE) 25 MG tablet TAKE 1 TABLET(25 MG) BY MOUTH DAILY 09/26/21   Pincus Sanes, MD      Allergies    Definity [perflutren lipid microsphere] and Pletal [cilostazol]    Review of Systems   Review of Systems  Physical Exam Updated Vital Signs BP (!) 180/88   Pulse 72   Temp 97.7 F (36.5 C) (Rectal)   Resp 18   SpO2 98%  Physical Exam Vitals and nursing note reviewed.  Constitutional:      General: She is not in acute distress.    Appearance: She is well-developed. She is not diaphoretic.  HENT:     Head: Normocephalic and atraumatic.  Eyes:     Pupils: Pupils are equal, round, and reactive to light.  Cardiovascular:     Rate and Rhythm: Normal rate and regular rhythm.     Heart sounds: No murmur heard.    No friction rub. No gallop.  Pulmonary:     Effort: Pulmonary effort is normal.     Breath sounds: No wheezing or rales.  Abdominal:     General: There is no distension.     Palpations: Abdomen is soft.     Tenderness: There is no abdominal tenderness.  Musculoskeletal:  General: No tenderness.     Cervical back: Normal range of motion and neck supple.     Right lower leg: Edema present.     Left lower leg: Edema present.     Comments: 2+ lower extremity edema to the midshin chronic per patient.  Contractures of bilateral hips  Skin:    General: Skin is warm and dry.  Neurological:     Mental Status: She is alert.  Psychiatric:        Behavior: Behavior normal.     ED Results / Procedures / Treatments   Labs (all labs ordered are listed, but only abnormal results are displayed) Labs Reviewed  COMPREHENSIVE METABOLIC PANEL - Abnormal; Notable for the following components:      Result Value   BUN 24 (*)    Creatinine, Ser 1.26 (*)    GFR, Estimated 43 (*)    All other components within normal limits  CBC WITH DIFFERENTIAL/PLATELET - Abnormal;  Notable for the following components:   WBC 10.8 (*)    MCV 103.6 (*)    Monocytes Absolute 1.1 (*)    All other components within normal limits  URINALYSIS, ROUTINE W REFLEX MICROSCOPIC - Abnormal; Notable for the following components:   Color, Urine AMBER (*)    APPearance CLOUDY (*)    Hgb urine dipstick LARGE (*)    Ketones, ur 5 (*)    Protein, ur 100 (*)    Leukocytes,Ua LARGE (*)    RBC / HPF >50 (*)    WBC, UA >50 (*)    Bacteria, UA MANY (*)    Non Squamous Epithelial 0-5 (*)    All other components within normal limits  TSH - Abnormal; Notable for the following components:   TSH 11.897 (*)    All other components within normal limits  I-STAT VENOUS BLOOD GAS, ED - Abnormal; Notable for the following components:   pH, Ven 7.474 (*)    pCO2, Ven 37.6 (*)    Acid-Base Excess 4.0 (*)    Potassium 3.3 (*)    Calcium, Ion 1.00 (*)    All other components within normal limits  LACTIC ACID, PLASMA  T4, FREE  AMMONIA  CORTISOL  CBG MONITORING, ED    EKG EKG Interpretation  Date/Time:  Friday November 16 2021 16:33:25 EDT Ventricular Rate:  76 PR Interval:    QRS Duration: 110 QT Interval:  429 QTC Calculation: 483 R Axis:   11 Text Interpretation: Atrial fibrillation Probable inferior infarct, old Anterolateral infarct, age indeterminate No significant change since last tracing Confirmed by Melene Plan [phone]) on 11/16/2021 5:41:16 PM  Radiology CT HEAD WO CONTRAST  Result Date: 11/16/2021 CLINICAL DATA:  Altered mental status. Possible UTI. History of dementia. EXAM: CT HEAD WITHOUT CONTRAST TECHNIQUE: Contiguous axial images were obtained from the base of the skull through the vertex without intravenous contrast. RADIATION DOSE REDUCTION: This exam was performed according to the departmental dose-optimization program which includes automated exposure control, adjustment of the mA and/or kV according to patient size and/or use of iterative reconstruction technique.  COMPARISON:  09/03/2021, 06/07/2020 FINDINGS: Brain: Ventricles and cisterns are within normal. Old left MCA territory infarct. Old infarct adjacent the right frontal horn with mild associated dilatation of the frontal horn. Minimal chronic ischemic microvascular disease. Possible small low bilateral peripheral cerebellar infarcts. No mass, mass effect or shift of midline structures. No acute hemorrhage. Vascular: No hyperdense vessel or unexpected calcification. Skull: Stable sclerotic focus over the right frontal  bone. Sinuses/Orbits: No acute finding. Other: None. IMPRESSION: 1. No acute findings. 2. 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 Possible small low bilateral peripheral cerebellar infarcts. 3. Minimal chronic ischemic microvascular disease. Electronically Signed   By: Elberta Fortis M.D.   On: 11/16/2021 17:18   DG Chest Port 1 View  Result Date: 11/16/2021 CLINICAL DATA:  Altered mental status. EXAM: PORTABLE CHEST 1 VIEW COMPARISON:  Chest x-ray 09/02/2021 FINDINGS: The heart is enlarged. Cardiac stents are present, unchanged. The lungs are clear. There is no pleural effusion or pneumothorax. No acute fractures are seen. IMPRESSION: 1. No acute cardiopulmonary process. 2. Mild cardiomegaly. Electronically Signed   By: Darliss Cheney M.D.   On: 11/16/2021 15:34    Procedures Procedures    Medications Ordered in ED Medications  cefTRIAXone (ROCEPHIN) 2 g in sodium chloride 0.9 % 100 mL IVPB (has no administration in time range)  sodium chloride 0.9 % bolus 1,000 mL (1,000 mLs Intravenous New Bag/Given 11/16/21 1636)    ED Course/ Medical Decision Making/ A&P                           Medical Decision Making Amount and/or Complexity of Data Reviewed Labs: ordered. Radiology: ordered.   81 yo F with a cc of AMS.  This was reported by family.  Patient denies any complaints denies chest pain difficulty breathing.  She is very cool to touch diffusely.  She has a  history of hypothyroidism.  Was just in the hospital for urosepsis.  We will obtain an altered mental status work-up rectal temperature in and out cath chest x-ray CT of the head.  Bolus of IV fluids.  Reassess.  Patient remains a bit confused for me.  Urine is concerning for urinary tract infection with large leukocyte esterase and too numerous to count bacteria.  I reviewed the patient's old micro reports, seem to be sensitive to Rocephin.    She does have hypothyroidism based on lab work here.  Not hypothermic not hypoglycemic not hyponatremic I think unlikely to be myxedema coma. Lactate normal.   Will discuss with medicine for admission.  The patients results and plan were reviewed and discussed.   Any x-rays performed were independently reviewed by myself.   Differential diagnosis were considered with the presenting HPI.  Medications  cefTRIAXone (ROCEPHIN) 2 g in sodium chloride 0.9 % 100 mL IVPB (has no administration in time range)  sodium chloride 0.9 % bolus 1,000 mL (1,000 mLs Intravenous New Bag/Given 11/16/21 1636)    Vitals:   11/16/21 1600 11/16/21 1630 11/16/21 1815 11/16/21 1819  BP: (!) 163/89 (!) 154/88 (!) 180/88   Pulse: 78 78 72   Resp: 18 16 18    Temp:    97.7 F (36.5 C)  TempSrc:    Rectal  SpO2: 98% 98% 98%     Final diagnoses:  Acute cystitis without hematuria  AKI (acute kidney injury) (HCC)    Admission/ observation were discussed with the admitting physician, patient and/or family and they are comfortable with the plan.           Final Clinical Impression(s) / ED Diagnoses Final diagnoses:  Acute cystitis without hematuria  AKI (acute kidney injury) [facility])    Rx / DC Orders ED Discharge Orders     None         [facility], DO 11/16/21 1839

## 2021-11-16 NOTE — H&P (Signed)
History and Physical    Patient: Yvonne Bailey PPJ:[id number] DOB: Jul 09, 1940 DOA: 11/16/2021 DOS: the patient was seen and examined on 11/16/2021 PCP: Binnie Rail, MD  Patient coming from: Home  Chief Complaint:  Chief Complaint  Patient presents with   Altered Mental Status   HPI: Yvonne Bailey is a 81 y.o. female with medical history significant of Previous CVA with residual aphasia, coronary artery disease, chronic kidney disease stage III, atrial fibrillation, essential hypertension, history of DVT, on chronic anticoagulation,Hyperlipoidemia and hypothyroidism who was brought in by family secondary to altered mental status.  Patient apparently has been more confused than usual.  Associated with urinary urgency.  No fever no chills.  No nausea vomiting or diarrhea.  Patient is seen and evaluated in the ER.  Urinalysis showed evidence of acute cystitis.  She is confused not able to give much history.  She also has poor hearing.  At this point patient is being admitted with acute metabolic encephalopathy secondary to UTI.  Review of Systems: As mentioned in the history of present illness. All other systems reviewed and are negative. Past Medical History:  Diagnosis Date   Bilateral leg edema 07/19/2015   CAD (coronary artery disease)    a. anterior MI s/p PCI in 1992. b. cath 08/2015 with severe three-vessel CAD turned down for CABG and underwent DESx5 to prox Cx/OM2/D1/oRCA/mRCA   Carotid artery disease ([place])    a. carotid duplex 03/2015 showed 1-39% BICA, normal subclavian arteries, chronically occluded left vertebral, f/u recommended only PRN.   DVT (deep venous thrombosis) ([place])    X1   History of nuclear stress test    a. Myoview 6/17: EF 20-25%, mid anteroseptal, apical anterior, apical septal, apical inferior, apical lateral and apical scar, no ischemia, intermediate risk   HTN (hypertension)    Hyperlipidemia    Hypokalemia    Hypothyroidism    Ischemic  cardiomyopathy    a. Echo 6/17: EF 20-25%, apex appears akinetic, MAC, moderate MR, moderate LAE, mild RVE, trivial PI, PASP 47 mmHg (needs repeat with Definity contrast).  b. Limited echo with Definity contrast 7/17: EF 25-30%, moderate to severe LAE. c. Limited Echo 2018 showed EF 40-45%.   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 02/2017 in setting of noncompliance with Eliquis   TIA (transient ischemic attack)    Tricuspid regurgitation    Past Surgical History:  Procedure Laterality Date   APPENDECTOMY     at hysterectomy and USO for fibroids, Dr. Ysidro Evert   CARDIAC CATHETERIZATION  1992   Dr Eustace Quail   CARDIAC CATHETERIZATION N/A 09/06/2015   Procedure: Right/Left Heart Cath and Coronary Angiography;  Surgeon: Burnell Blanks, MD;  Location: [facility];  Service: Cardiovascular;  Laterality: N/A;   CARDIAC CATHETERIZATION N/A 09/07/2015   Procedure: Coronary Stent Intervention;  Surgeon: Burnell Blanks, MD;  Location: [facility];  Service: Cardiovascular;  Laterality: N/A;   COLONOSCOPY     negative; 2008, Dr. Delfin Edis   fracture LLE     '94; pinned   IR ANGIO INTRA EXTRACRAN  SEL COM CAROTID INNOMINATE BILAT MOD SED  03/02/2017   IR ANGIO INTRA EXTRACRAN SEL COM CAROTID INNOMINATE BILAT MOD SED  04/23/2018   IR ANGIO VERTEBRAL SEL SUBCLAVIAN INNOMINATE BILAT MOD SED  04/23/2018   IR ANGIO VERTEBRAL SEL VERTEBRAL UNI R MOD SED  03/02/2017   IR RADIOLOGIST EVAL & MGMT  04/28/2017   IR US GUIDE VASC ACCESS RIGHT  04/23/2018   RADIOLOGY WITH ANESTHESIA N/A 03/02/2017   Procedure: RADIOLOGY WITH ANESTHESIA;   Surgeon: Luanne Bras, MD;  Location: [facility];  Service: Radiology;  Laterality: N/A;   TEE WITHOUT CARDIOVERSION N/A 11/02/2020   Procedure: TRANSESOPHAGEAL ECHOCARDIOGRAM (TEE);  Surgeon: Sanda Klein, MD;  Location[facility];  Service: Cardiovascular;  Laterality: N/A;   TONSILLECTOMY     TOTAL ABDOMINAL HYSTERECTOMY     & BSO for fibroids   Social History:  reports that she quit smoking about 31 years ago. Her smoking use included cigarettes. She has never used smokeless tobacco. She reports that she does not drink alcohol and does not use drugs.  Allergies  Allergen Reactions   Definity [Perflutren Lipid Microsphere] Other (See Comments)    Patient experienced back pain with injection   Pletal [Cilostazol] Other (See Comments)    Made pt "feel funny"    Family History  Problem Relation Age of Onset   Diabetes Mother    Hypertension Mother    Stroke Brother        ?> 55   Coronary artery disease Brother        stent in 57s   Cancer Neg Hx     Prior to Admission medications   Medication Sig Start Date End Date Taking? Authorizing Provider  aspirin EC 81 MG tablet Take 1 tablet (81 mg total) by mouth daily. 04/21/17   Imogene Burn, PA-C  atorvastatin (LIPITOR) 80 MG tablet TAKE 1 TABLET(80 MG) BY MOUTH DAILY Patient taking differently: Take 80 mg by mouth daily. 07/30/21   Burns, Claudina Lick, MD  benazepril (LOTENSIN) 20 MG tablet TAKE 1 AND 1/2 TABLETS(30 MG) BY MOUTH DAILY Patient taking differently: Take 30 mg by mouth daily. 02/06/21   Burns, Claudina Lick, MD  ELIQUIS 5 MG TABS tablet TAKE 1 TABLET(5 MG) BY MOUTH TWICE DAILY 09/17/21   Binnie Rail, MD  estradiol (ESTRACE) 0.1 MG/GM vaginal cream Place vaginally. 01/15/21   [provider]  levothyroxine (SYNTHROID) 88 MCG tablet TAKE 1 TABLET(88 MCG) BY MOUTH DAILY Patient taking differently: Take 88 mcg by mouth daily before breakfast. 08/07/21   Burns, Claudina Lick, MD  MYRBETRIQ 25 MG TB24 tablet TAKE 1  TABLET(25 MG) BY MOUTH DAILY Patient taking differently: Take 25 mg by mouth daily. 04/26/21   Binnie Rail, MD  nitroGLYCERIN (NITROSTAT) 0.4 MG SL tablet Place 1 tablet (0.4 mg total) under the tongue every 5 (five) minutes as needed for chest pain. 09/08/15   Arbutus Leas, NP  polyethylene glycol (MIRALAX) 17 g packet Take one dose 3 times a day until bowel clear, maximum of 3 consecutive days. Patient taking differently: Take 17 g by mouth daily as needed for mild constipation. 10/30/20   Charlann Lange, PA-C  sertraline (ZOLOFT) 100 MG tablet Take 1 tablet (100 mg total) by mouth daily. 05/24/21   Binnie Rail, MD  spironolactone (ALDACTONE) 25 MG tablet TAKE 1 TABLET(25 MG) BY MOUTH DAILY 09/26/21   Binnie Rail, MD    Physical Exam: Vitals:   11/16/21 1600 11/16/21 1630  11/16/21 1815 11/16/21 1819  BP: (!) 163/89 (!) 154/88 (!) 180/88   Pulse: 78 78 72   Resp: _0 Temp:    97.7 F (36.5 C)  TempSrc:    Rectal  SpO2: 98% 98% 98%    Generally: Frail, chronically ill looking, confused, not agitated HEENT: PERRLA, EOMI, no pallor or jaundice Neck: Supple, no JVD, no lymphadenopathy Respiratory: Good air entry bilaterally, no wheeze rales or crackles Cardiovascular system: Irregularly irregular Abdomen: Soft, nontender with positive bowel sounds Extremity: No edema cyanosis or clubbing Skin exam: No rashes or ulcers Neuro exam: Cranial nerves II through XII seem to be intact, normal power, no sensory changes Psych: Confused but not agitated or withdrawal  Data Reviewed:  BUN is 24 creatinine 1.26 with GFR of 43, potassium repeat is 3.3, white count 10.8 the rest of the CMP within normal and CBC also urinalysis showed cloudy urine with large hemoglobin large leukocytes many bacteria RBC more than 50 and WBC more than 50.  Head CT without contrast is within normal.  TSH level 0.897 but free T40.88.  Chest x-ray showed no acute findings.  EKG showed atrial  fibrillation.  Assessment and Plan:   #1 acute metabolic encephalopathy: Most likely secondary to UTI.  Patient is at baseline.  We will initiate Rocephin.  Monitor mental status until he goes back to baseline.  #2 acute cystitis with hematuria: Initiate IV Rocephin.  Urine and blood cultures obtained and will follow results.  #3 history of CVA: Residual aphasia.  No dysphagia at this point.  Continue to monitor  #4 anxiety with depression: Continue home regimen  #5 atrial fibrillation: Rate is controlled.  Continue Eliquis  #6 peripheral vascular disease: Stable, no distress  #7 coronary artery disease: Stable at baseline  #8 chronic kidney disease stage III: Stable.  Continue monitor     Advance Care Planning:   Code Status: Prior full code  Consults: None  Family Communication: Daughter   Severity of Illness: The appropriate patient status for this patient is INPATIENT. Inpatient status is judged to be reasonable and necessary in order to provide the required intensity of service to ensure the patient's safety. The patient's presenting symptoms, physical exam findings, and initial radiographic and laboratory data in the context of their chronic comorbidities is felt to place them at high risk for further clinical deterioration. Furthermore, it is not anticipated that the patient will be medically stable for discharge from the hospital within 2 midnights of admission.   * I certify that at the point of admission it is my clinical judgment that the patient will require inpatient hospital care spanning beyond 2 midnights from the point of admission due to high intensity of service, high risk for further deterioration and high frequency of surveillance required.*  AuthorBarbette Merino, MD 11/16/2021 7:24 PM  For on call review [email or web address].

## 2021-11-16 NOTE — ED Triage Notes (Signed)
Patient BIB [facility] from home where she lives with son for altered mental status. Patient has history of dementia but normally recognizes her home health nurse but did not today. Home health also reported to EMS she was concerned the patient may have a UTI. Patient is alert and disoriented. EMS Vitals BP 142/68 HR 85 97% on room air CBG 98

## 2021-11-16 NOTE — Telephone Encounter (Signed)
Per home health patient is very confused, not safe in home - home health nurse is calling 911 today.

## 2021-11-17 DIAGNOSIS — E876 Hypokalemia: Secondary | ICD-10-CM

## 2021-11-17 DIAGNOSIS — I82409 Acute embolism and thrombosis of unspecified deep veins of unspecified lower extremity: Secondary | ICD-10-CM

## 2021-11-17 DIAGNOSIS — I69320 Aphasia following cerebral infarction: Secondary | ICD-10-CM

## 2021-11-17 DIAGNOSIS — F419 Anxiety disorder, unspecified: Secondary | ICD-10-CM | POA: Diagnosis not present

## 2021-11-17 DIAGNOSIS — I2510 Atherosclerotic heart disease of native coronary artery without angina pectoris: Secondary | ICD-10-CM

## 2021-11-17 DIAGNOSIS — N3001 Acute cystitis with hematuria: Principal | ICD-10-CM

## 2021-11-17 DIAGNOSIS — F32A Depression, unspecified: Secondary | ICD-10-CM

## 2021-11-17 DIAGNOSIS — N1831 Chronic kidney disease, stage 3a: Secondary | ICD-10-CM

## 2021-11-17 DIAGNOSIS — I739 Peripheral vascular disease, unspecified: Secondary | ICD-10-CM

## 2021-11-17 DIAGNOSIS — I4819 Other persistent atrial fibrillation: Secondary | ICD-10-CM

## 2021-11-17 DIAGNOSIS — G9341 Metabolic encephalopathy: Secondary | ICD-10-CM | POA: Diagnosis not present

## 2021-11-17 LAB — CBC
HCT: 36.3 % (ref 36.0–46.0)
Hemoglobin: 11.9 g/dL — ABNORMAL LOW (ref 12.0–15.0)
MCH: 32.4 pg (ref 26.0–34.0)
MCHC: 32.8 g/dL (ref 30.0–36.0)
MCV: 98.9 fL (ref 80.0–100.0)
Platelets: 273 10*3/uL (ref 150–400)
RBC: 3.67 MIL/uL — ABNORMAL LOW (ref 3.87–5.11)
RDW: 13.7 % (ref 11.5–15.5)
WBC: 8.2 10*3/uL (ref 4.0–10.5)
nRBC: 0 % (ref 0.0–0.2)

## 2021-11-17 LAB — COMPREHENSIVE METABOLIC PANEL
ALT: 15 U/L (ref 0–44)
AST: 27 U/L (ref 15–41)
Albumin: 3 g/dL — ABNORMAL LOW (ref 3.5–5.0)
Alkaline Phosphatase: 82 U/L (ref 38–126)
Anion gap: 10 (ref 5–15)
BUN: 16 mg/dL (ref 8–23)
CO2: 23 mmol/L (ref 22–32)
Calcium: 8.3 mg/dL — ABNORMAL LOW (ref 8.9–10.3)
Chloride: 110 mmol/L (ref 98–111)
Creatinine, Ser: 0.97 mg/dL (ref 0.44–1.00)
GFR, Estimated: 59 mL/min — ABNORMAL LOW (ref >60)
Glucose, Bld: 78 mg/dL (ref 70–99)
Potassium: 2.8 mmol/L — ABNORMAL LOW (ref 3.5–5.1)
Sodium: 143 mmol/L (ref 135–145)
Total Bilirubin: 0.9 mg/dL (ref 0.3–1.2)
Total Protein: 6.2 g/dL — ABNORMAL LOW (ref 6.5–8.1)

## 2021-11-17 MED ORDER — LEVOTHYROXINE SODIUM 88 MCG PO TABS
88.0000 ug | ORAL_TABLET | Freq: Every day | ORAL | Status: DC
  Administered 2021-11-18 – 2021-11-20 (×3): 88 ug via ORAL
  Filled 2021-11-17 (×3): qty 1

## 2021-11-17 MED ORDER — SPIRONOLACTONE 25 MG PO TABS
25.0000 mg | ORAL_TABLET | Freq: Every day | ORAL | Status: DC
  Administered 2021-11-17 – 2021-11-20 (×4): 25 mg via ORAL
  Filled 2021-11-17 (×4): qty 1

## 2021-11-17 MED ORDER — ASPIRIN 81 MG PO TBEC
81.0000 mg | DELAYED_RELEASE_TABLET | Freq: Every day | ORAL | Status: DC
  Administered 2021-11-17 – 2021-11-20 (×4): 81 mg via ORAL
  Filled 2021-11-17 (×4): qty 1

## 2021-11-17 MED ORDER — MIRABEGRON ER 25 MG PO TB24
25.0000 mg | ORAL_TABLET | Freq: Every day | ORAL | Status: DC
  Administered 2021-11-17 – 2021-11-20 (×4): 25 mg via ORAL
  Filled 2021-11-17 (×4): qty 1

## 2021-11-17 MED ORDER — ATORVASTATIN CALCIUM 80 MG PO TABS
80.0000 mg | ORAL_TABLET | Freq: Every day | ORAL | Status: DC
  Administered 2021-11-17 – 2021-11-20 (×4): 80 mg via ORAL
  Filled 2021-11-17 (×4): qty 1

## 2021-11-17 MED ORDER — POTASSIUM CHLORIDE CRYS ER 20 MEQ PO TBCR
40.0000 meq | EXTENDED_RELEASE_TABLET | Freq: Once | ORAL | Status: AC
  Administered 2021-11-17: 40 meq via ORAL
  Filled 2021-11-17: qty 2

## 2021-11-17 MED ORDER — SERTRALINE HCL 100 MG PO TABS
100.0000 mg | ORAL_TABLET | Freq: Every day | ORAL | Status: DC
  Administered 2021-11-17 – 2021-11-20 (×4): 100 mg via ORAL
  Filled 2021-11-17 (×4): qty 1

## 2021-11-17 MED ORDER — POLYETHYLENE GLYCOL 3350 17 G PO PACK: 17.0000 g | PACK | Freq: Every day | ORAL | Status: DC | PRN

## 2021-11-17 MED ORDER — POTASSIUM CHLORIDE 10 MEQ/100ML IV SOLN
10.0000 meq | INTRAVENOUS | Status: AC
  Administered 2021-11-17 (×4): 10 meq via INTRAVENOUS
  Filled 2021-11-17 (×4): qty 100

## 2021-11-17 MED ORDER — BENAZEPRIL HCL 5 MG PO TABS
30.0000 mg | ORAL_TABLET | Freq: Every day | ORAL | Status: DC
  Administered 2021-11-17 – 2021-11-20 (×4): 30 mg via ORAL
  Filled 2021-11-17 (×4): qty 2

## 2021-11-17 MED ORDER — NITROGLYCERIN 0.4 MG SL SUBL: 0.4000 mg | SUBLINGUAL_TABLET | SUBLINGUAL | Status: DC | PRN

## 2021-11-17 NOTE — Hospital Course (Addendum)
Yvonne Bailey is a 81 y.o. female with past medical history significant for previous history of CVA with residual aphasia, CAD, chronic kidney disease stage III, atrial fibrillation, hypertension, history of DVT on chronic anticoagulation, hyperlipidemia and hypothyroidism was brought in the hospital with altered mental status and increased confusion.  Patient had been having urinary urgency at home.  In the ED patient had urinalysis done which showed evidence of infection.  Patient was then admitted hospital for medical encephalopathy and UTI.

## 2021-11-17 NOTE — Progress Notes (Signed)
PROGRESS NOTE    Yvonne Bailey  [id number] DOB: [birth year] DOA: 11/16/2021 PCP: Binnie Rail, MD    Brief Narrative:  Yvonne Bailey is a 81 y.o. female with past medical history significant for previous history of CVA with residual aphasia, CAD, chronic kidney disease stage III, atrial fibrillation, hypertension, history of DVT on chronic anticoagulation, hyperlipidemia and hypothyroidism was brought in the hospital with altered mental status and increased confusion.  Patient had been having urinary urgency at home.  In the ED, patient had urinalysis done which showed evidence of infection.  Patient was then admitted hospital for medical encephalopathy and UTI.  Assessment and plan. Principal Problem:   Acute metabolic encephalopathy Active Problems:   Peripheral vascular disease (HCC)   Acute thromboembolism of deep veins of lower extremity (HCC)   Atrial fibrillation (HCC)   Coronary artery disease involving native coronary artery without angina pectoris   Stage 3 chronic kidney disease (HCC)   Aphasia as late effect of cerebrovascular accident (CVA)   Anxiety and depression   Acute cystitis   Hypokalemia    acute metabolic encephalopathy:  Likely secondary to UTI.  Continue Rocephin.  Ammonia at 23.  Cortisol 11.7. Leukocytosis has normalized..  Percent max of 97.98 Fahrenheit.  Severe hypokalemia.  Will replace with IV and orally.  Check levels in AM.   acute cystitis with hematuria:  Continue IV Rocephin.  Culture report not available.  WBC at 8.2 today.    history of CVA with residual aphasia.  No difficulty swallowing at this time.  anxiety with depression: On sertraline at home.  Will resume.   atrial fibrillation: Rate controlled at this time.  On Eliquis at home.  Not on nodal blockers.   peripheral vascular disease: Continue aspirin Lipitor and Eliquis.  We will continue.  coronary artery disease: No chest pain.  Continue aspirin Lipitor ACE  inhibitor's and spironolactone.   chronic kidney disease stage IIIa: Stable.  Continue monitor creatinine closely.  Hypothyroidism.  On Synthroid 88 mcg.  TSH elevated at 11.8.  Might need to reassess as outpatient and adjust Synthroid dose after acute illnesses taken care of..  Weakness, debility.  We will get PT evaluation.     DVT prophylaxis: apixaban (ELIQUIS) tablet 2.5 mg Start: 11/16/21 2245 apixaban (ELIQUIS) tablet 2.5 mg   Code Status:     Code Status: Full Code  Disposition: Home with home health likely in 1 to 2 days  Status is: Inpatient  Remains inpatient appropriate because: IV antibiotic, altered mental status, debility, severe hypokalemia   Family Communication: None at bedside  Consultants:  None  Procedures:  None  Antimicrobials:  Rocephin IV  Anti-infectives (From admission, onward)    Start     Dose/Rate Route Frequency Ordered Stop   11/17/21 2000  cefTRIAXone (ROCEPHIN) 1 g in sodium chloride 0.9 % 100 mL IVPB        1 g 200 mL/hr over 30 Minutes Intravenous Every 24 hours 11/16/21 2151     11/16/21 1815  cefTRIAXone (ROCEPHIN) 2 g in sodium chloride 0.9 % 100 mL IVPB        2 g 200 mL/hr over 30 Minutes Intravenous  Once 11/16/21 1807 11/16/21 2225      Subjective: Today, patient was seen and examined at bedside.  Patient denies any fevers, chills nausea, vomiting.  Complains of frequency of urination.  Hard of hearing.    Objective: Vitals:   11/16/21 1819 11/16/21 2155 11/16/21 2156 11/17/21 [id number]  BP:  (!) 168/99 (!) 168/99 (!) 157/88  Pulse:  92 85 78  Resp:  17 17 16   Temp: 97.7 F (36.5 C) 97.7 F (36.5 C)  97.9 F (36.6 C)  TempSrc: Rectal Oral  Oral  SpO2:   100% 99%  Weight:  72.9 kg    Height:  5\' 6"  (1.676 m)      Intake/Output Summary (Last 24 hours) at 11/17/2021 1419 Last data filed at 11/17/2021 0900 Gross per 24 hour  Intake 340 ml  Output 0 ml  Net 340 ml   Filed Weights   11/16/21 2155  Weight: 72.9 kg     Physical Examination: Body mass index is 25.94 kg/m.  General:  Average built, not in obvious distress, elderly female, deconditioned, frail appearing. HENT:   No scleral pallor or icterus noted. Oral mucosa is moist.  Chest:  Clear breath sounds.  Diminished breath sounds bilaterally. No crackles or wheezes.  CVS: S1 &S2 heard. No murmur.  Regular rate and rhythm. Abdomen: Soft, nontender, nondistended.  Bowel sounds are heard.   Extremities: No cyanosis, clubbing or edema.  Peripheral pulses are palpable. Psych: Alert, awake and Communicative, oriented to time and place normal mood CNS:  No cranial nerve deficits.  Power equal in all extremities.   Skin: Warm and dry.  No rashes noted.  Data Reviewed:   CBC: Recent Labs  Lab 11/16/21 1510 11/16/21 1638 11/17/21 0632  WBC 10.8*  --  8.2  NEUTROABS 7.6  --   --   HGB 12.8 12.9 11.9*  HCT 40.7 38.0 36.3  MCV 103.6*  --  98.9  PLT 277  --  273    Basic Metabolic Panel: Recent Labs  Lab 11/16/21 1510 11/16/21 1638 11/17/21 0632  NA 143 141 143  K 3.6 3.3* 2.8*  CL 106  --  110  CO2 24  --  23  GLUCOSE 97  --  78  BUN 24*  --  16  CREATININE 1.26*  --  0.97  CALCIUM 9.0  --  8.3*    Liver Function Tests: Recent Labs  Lab 11/16/21 1510 11/17/21 0632  AST 31 27  ALT 16 15  ALKPHOS 97 82  BILITOT 1.1 0.9  PROT 7.6 6.2*  ALBUMIN 3.6 3.0*     Radiology Studies: CT HEAD WO CONTRAST  Result Date: 11/16/2021 CLINICAL DATA:  Altered mental status. Possible UTI. History of dementia. EXAM: CT HEAD WITHOUT CONTRAST TECHNIQUE: Contiguous axial images were obtained from the base of the skull through the vertex without intravenous contrast. RADIATION DOSE REDUCTION: This exam was performed according to the departmental dose-optimization program which includes automated exposure control, adjustment of the mA and/or kV according to patient size and/or use of iterative reconstruction technique. COMPARISON:  09/03/2021,  06/07/2020 FINDINGS: Brain: Ventricles and cisterns are within normal. Old left MCA territory infarct. Old infarct adjacent the right frontal horn with mild associated dilatation of the frontal horn. Minimal chronic ischemic microvascular disease. Possible small low bilateral peripheral cerebellar infarcts. No mass, mass effect or shift of midline structures. No acute hemorrhage. Vascular: No hyperdense vessel or unexpected calcification. Skull: Stable sclerotic focus over the right frontal bone. Sinuses/Orbits: No acute finding. Other: None. IMPRESSION: 1. No acute findings. 2. 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 Possible small low bilateral peripheral cerebellar infarcts. 3. Minimal chronic ischemic microvascular disease. Electronically Signed   By: 09/05/2021 M.D.   On: 11/16/2021 17:18   DG Chest [facility]  1 View  Result Date: 11/16/2021 CLINICAL DATA:  Altered mental status. EXAM: PORTABLE CHEST 1 VIEW COMPARISON:  Chest x-ray 09/02/2021 FINDINGS: The heart is enlarged. Cardiac stents are present, unchanged. The lungs are clear. There is no pleural effusion or pneumothorax. No acute fractures are seen. IMPRESSION: 1. No acute cardiopulmonary process. 2. Mild cardiomegaly. Electronically Signed   By: Darliss Cheney M.D.   On: 11/16/2021 15:34      LOS: 1 day    Joycelyn Das, MD [facility] Available via Epic secure chat 7am-7pm After these hours, please refer to coverage provider listed on [email or web address] 11/17/2021, 2:19 PM

## 2021-11-17 NOTE — Plan of Care (Signed)

## 2021-11-18 ENCOUNTER — Other Ambulatory Visit: Payer: Self-pay

## 2021-11-18 ENCOUNTER — Encounter (HOSPITAL_COMMUNITY): Payer: Self-pay | Admitting: Internal Medicine

## 2021-11-18 DIAGNOSIS — G9341 Metabolic encephalopathy: Secondary | ICD-10-CM | POA: Diagnosis not present

## 2021-11-18 DIAGNOSIS — N179 Acute kidney failure, unspecified: Secondary | ICD-10-CM | POA: Diagnosis not present

## 2021-11-18 DIAGNOSIS — N3001 Acute cystitis with hematuria: Secondary | ICD-10-CM | POA: Diagnosis not present

## 2021-11-18 DIAGNOSIS — I82409 Acute embolism and thrombosis of unspecified deep veins of unspecified lower extremity: Secondary | ICD-10-CM | POA: Diagnosis not present

## 2021-11-18 LAB — CBC
HCT: 36.6 % (ref 36.0–46.0)
Hemoglobin: 11.9 g/dL — ABNORMAL LOW (ref 12.0–15.0)
MCH: 32.6 pg (ref 26.0–34.0)
MCHC: 32.5 g/dL (ref 30.0–36.0)
MCV: 100.3 fL — ABNORMAL HIGH (ref 80.0–100.0)
Platelets: 271 10*3/uL (ref 150–400)
RBC: 3.65 MIL/uL — ABNORMAL LOW (ref 3.87–5.11)
RDW: 13.8 % (ref 11.5–15.5)
WBC: 11.6 10*3/uL — ABNORMAL HIGH (ref 4.0–10.5)
nRBC: 0 % (ref 0.0–0.2)

## 2021-11-18 LAB — BASIC METABOLIC PANEL
Anion gap: 7 (ref 5–15)
BUN: 17 mg/dL (ref 8–23)
CO2: 23 mmol/L (ref 22–32)
Calcium: 8.5 mg/dL — ABNORMAL LOW (ref 8.9–10.3)
Chloride: 111 mmol/L (ref 98–111)
Creatinine, Ser: 1.17 mg/dL — ABNORMAL HIGH (ref 0.44–1.00)
GFR, Estimated: 47 mL/min — ABNORMAL LOW (ref >60)
Glucose, Bld: 99 mg/dL (ref 70–99)
Potassium: 3.5 mmol/L (ref 3.5–5.1)
Sodium: 141 mmol/L (ref 135–145)

## 2021-11-18 LAB — MAGNESIUM: Magnesium: 1.6 mg/dL — ABNORMAL LOW (ref 1.7–2.4)

## 2021-11-18 MED ORDER — MAGNESIUM OXIDE -MG SUPPLEMENT 400 (240 MG) MG PO TABS
400.0000 mg | ORAL_TABLET | Freq: Two times a day (BID) | ORAL | Status: DC
  Administered 2021-11-18 – 2021-11-20 (×5): 400 mg via ORAL
  Filled 2021-11-18 (×5): qty 1

## 2021-11-18 MED ORDER — POTASSIUM CHLORIDE CRYS ER 20 MEQ PO TBCR
40.0000 meq | EXTENDED_RELEASE_TABLET | Freq: Once | ORAL | Status: AC
  Administered 2021-11-18: 40 meq via ORAL
  Filled 2021-11-18: qty 2

## 2021-11-18 NOTE — Plan of Care (Signed)

## 2021-11-18 NOTE — Progress Notes (Signed)
PROGRESS NOTE    Yvonne Bailey  [id number] DOB: 05/08/1940 DOA: 11/16/2021 PCP: Binnie Rail, MD    Brief Narrative:  Yvonne Bailey is a 81 y.o. female with past medical history significant for previous history of CVA with residual aphasia, CAD, chronic kidney disease stage III, atrial fibrillation, hypertension, history of DVT on chronic anticoagulation, hyperlipidemia and hypothyroidism was brought in the hospital with altered mental status and increased confusion.  Patient had been having urinary urgency at home.  In the ED, patient had urinalysis done which showed evidence of infection.  Patient was then admitted hospital for medical encephalopathy and UTI.  Assessment and plan. Principal Problem:   Acute metabolic encephalopathy Active Problems:   Peripheral vascular disease (HCC)   Acute thromboembolism of deep veins of lower extremity (HCC)   Atrial fibrillation (HCC)   Coronary artery disease involving native coronary artery without angina pectoris   Stage 3 chronic kidney disease (HCC)   Aphasia as late effect of cerebrovascular accident (CVA)   Anxiety and depression   Acute cystitis   Hypokalemia    Acute metabolic encephalopathy:  Likely secondary to UTI.  Continue Rocephin.  Ammonia at 23.  Cortisol 11.7.  Temperature max of 98.2 Fahrenheit.  Severe hypokalemia.  Will replace with IV and orally.  Check levels in AM.   acute cystitis with hematuria:  Continue IV Rocephin.  Culture report not available.  WBC mildly elevated at 11.6.    history of CVA with residual aphasia.  No difficulty swallowing at this time.  anxiety with depression: On sertraline at home.  Will resume.   atrial fibrillation: Rate controlled at this time.  On Eliquis at home.  Not on nodal blockers.   peripheral vascular disease: Continue aspirin Lipitor and Eliquis.  We will continue.  coronary artery disease: No chest pain.  Continue aspirin, Lipitor, ACE inhibitor's and  spironolactone.   chronic kidney disease stage IIIa: Stable.  Continue monitor creatinine closely.  Hypothyroidism.  On Synthroid 88 mcg.  TSH elevated at 11.8.  Might need to reassess as outpatient and adjust Synthroid dose after acute illnesses taken care of..  Weakness, debility.  Physical therapy has seen the patient and recommended skilled nursing facility placement.  Spoke with the patient's son about it.     DVT prophylaxis: apixaban (ELIQUIS) tablet 2.5 mg Start: 11/16/21 2245 apixaban (ELIQUIS) tablet 2.5 mg   Code Status:     Code Status: Full Code  Disposition: Physical therapy has recommended skilled nursing facility placement.  Status is: Inpatient  Remains inpatient appropriate because: IV antibiotic, , debility, deconditioning, need for rehabitation   Family Communication:  Spoke with the patient's son at the bedside.  He wishes rehabilitation skilled nursing facility on discharge.  Consultants:  None  Procedures:  None  Antimicrobials:  Rocephin IV  Anti-infectives (From admission, onward)    Start     Dose/Rate Route Frequency Ordered Stop   11/17/21 2000  cefTRIAXone (ROCEPHIN) 1 g in sodium chloride 0.9 % 100 mL IVPB        1 g 200 mL/hr over 30 Minutes Intravenous Every 24 hours 11/16/21 2151     11/16/21 1815  cefTRIAXone (ROCEPHIN) 2 g in sodium chloride 0.9 % 100 mL IVPB        2 g 200 mL/hr over 30 Minutes Intravenous  Once 11/16/21 1807 11/16/21 2225      Subjective: Today, patient was seen and examined at bedside.  Denies any nausea vomiting fever chills  or rigor.    Objective: Vitals:   11/17/21 1601 11/17/21 1954 11/18/21 0430 11/18/21 0943  BP: 128/60 (!) 141/63 (!) 154/77 (!) 154/73  Pulse: 76 73 89 92  Resp: 18 17 17 17   Temp: 97.6 F (36.4 C) 98.2 F (36.8 C) 97.8 F (36.6 C) (!) 97.5 F (36.4 C)  TempSrc: Oral   Oral  SpO2: 100% 99% 100% 100%  Weight:      Height:        Intake/Output Summary (Last 24 hours) at 11/18/2021  1317 Last data filed at 11/18/2021 0943 Gross per 24 hour  Intake 623.47 ml  Output 500 ml  Net 123.47 ml    Filed Weights   11/16/21 2155  Weight: 72.9 kg    Physical Examination: Body mass index is 25.94 kg/m.   General:  not in obvious distress, elderly female, deconditioned and frail appearing. HENT:   No scleral pallor or icterus noted. Oral mucosa is moist.  Chest:  Clear breath sounds.  Diminished breath sounds bilaterally. No crackles or wheezes.  CVS: S1 &S2 heard. No murmur.  Regular rate and rhythm. Abdomen: Soft, nontender, nondistended.  Bowel sounds are heard.   Extremities: No cyanosis, clubbing or edema.  Peripheral pulses are palpable. Psych: Alert, awake and Communicative, oriented to time and place normal mood CNS:  No cranial nerve deficits.  Moves all extremities. Skin: Warm and dry.  No rashes noted.  Data Reviewed:   CBC: Recent Labs  Lab 11/16/21 1510 11/16/21 1638 11/17/21 0632 11/18/21 0147  WBC 10.8*  --  8.2 11.6*  NEUTROABS 7.6  --   --   --   HGB 12.8 12.9 11.9* 11.9*  HCT 40.7 38.0 36.3 36.6  MCV 103.6*  --  98.9 100.3*  PLT 277  --  273 271     Basic Metabolic Panel: Recent Labs  Lab 11/16/21 1510 11/16/21 1638 11/17/21 0632 11/18/21 0147  NA 143 141 143 141  K 3.6 3.3* 2.8* 3.5  CL 106  --  110 111  CO2 24  --  23 23  GLUCOSE 97  --  78 99  BUN 24*  --  16 17  CREATININE 1.26*  --  0.97 1.17*  CALCIUM 9.0  --  8.3* 8.5*  MG  --   --   --  1.6*     Liver Function Tests: Recent Labs  Lab 11/16/21 1510 11/17/21 0632  AST 31 27  ALT 16 15  ALKPHOS 97 82  BILITOT 1.1 0.9  PROT 7.6 6.2*  ALBUMIN 3.6 3.0*      Radiology Studies: CT HEAD WO CONTRAST  Result Date: 11/16/2021 CLINICAL DATA:  Altered mental status. Possible UTI. History of dementia. EXAM: CT HEAD WITHOUT CONTRAST TECHNIQUE: Contiguous axial images were obtained from the base of the skull through the vertex without intravenous contrast. RADIATION  DOSE REDUCTION: This exam was performed according to the departmental dose-optimization program which includes automated exposure control, adjustment of the mA and/or kV according to patient size and/or use of iterative reconstruction technique. COMPARISON:  09/03/2021, 06/07/2020 FINDINGS: Brain: Ventricles and cisterns are within normal. Old left MCA territory infarct. Old infarct adjacent the right frontal horn with mild associated dilatation of the frontal horn. Minimal chronic ischemic microvascular disease. Possible small low bilateral peripheral cerebellar infarcts. No mass, mass effect or shift of midline structures. No acute hemorrhage. Vascular: No hyperdense vessel or unexpected calcification. Skull: Stable sclerotic focus over the right frontal bone. Sinuses/Orbits: No acute  finding. Other: None. IMPRESSION: 1. No acute findings. 2. 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 Possible small low bilateral peripheral cerebellar infarcts. 3. Minimal chronic ischemic microvascular disease. Electronically Signed   By: Elberta Fortis M.D.   On: 11/16/2021 17:18   DG Chest Port 1 View  Result Date: 11/16/2021 CLINICAL DATA:  Altered mental status. EXAM: PORTABLE CHEST 1 VIEW COMPARISON:  Chest x-ray 09/02/2021 FINDINGS: The heart is enlarged. Cardiac stents are present, unchanged. The lungs are clear. There is no pleural effusion or pneumothorax. No acute fractures are seen. IMPRESSION: 1. No acute cardiopulmonary process. 2. Mild cardiomegaly. Electronically Signed   By: Darliss Cheney M.D.   On: 11/16/2021 15:34      LOS: 2 days    Joycelyn Das, MD [facility] Available via Epic secure chat 7am-7pm After these hours, please refer to coverage provider listed on [email or web address] 11/18/2021, 1:17 PM

## 2021-11-18 NOTE — Plan of Care (Signed)
  Problem: Education: Goal: Knowledge of General Education information will improve Description: Including pain rating scale, medication(s)/side effects and non-pharmacologic comfort measures 11/18/2021 0605 by Alcide Evener, RN Outcome: Progressing 11/18/2021 0602 by Alcide Evener, RN Outcome: Progressing   Problem: Health Behavior/Discharge Planning: Goal: Ability to manage health-related needs will improve 11/18/2021 0605 by Alcide Evener, RN Outcome: Progressing 11/18/2021 0602 by Alcide Evener, RN Outcome: Progressing   Problem: Clinical Measurements: Goal: Ability to maintain clinical measurements within normal limits will improve 11/18/2021 0605 by Alcide Evener, RN Outcome: Progressing 11/18/2021 0602 by Alcide Evener, RN Outcome: Progressing Goal: Will remain free from infection 11/18/2021 0605 by Alcide Evener, RN Outcome: Progressing 11/18/2021 0602 by Alcide Evener, RN Outcome: Progressing Goal: Diagnostic test results will improve 11/18/2021 0605 by Alcide Evener, RN Outcome: Progressing 11/18/2021 0602 by Alcide Evener, RN Outcome: Progressing Goal: Respiratory complications will improve 11/18/2021 0605 by Alcide Evener, RN Outcome: Progressing 11/18/2021 0602 by Alcide Evener, RN Outcome: Progressing Goal: Cardiovascular complication will be avoided 11/18/2021 0605 by Alcide Evener, RN Outcome: Progressing 11/18/2021 0602 by Alcide Evener, RN Outcome: Progressing   Problem: Activity: Goal: Risk for activity intolerance will decrease 11/18/2021 0605 by Alcide Evener, RN Outcome: Progressing 11/18/2021 0602 by Alcide Evener, RN Outcome: Progressing   Problem: Nutrition: Goal: Adequate nutrition will be maintained 11/18/2021 0605 by Alcide Evener, RN Outcome: Progressing 11/18/2021 0602 by Alcide Evener, RN Outcome: Progressing   Problem: Coping: Goal: Level  of anxiety will decrease 11/18/2021 0605 by Alcide Evener, RN Outcome: Progressing 11/18/2021 0602 by Alcide Evener, RN Outcome: Progressing   Problem: Elimination: Goal: Will not experience complications related to bowel motility 11/18/2021 0605 by Alcide Evener, RN Outcome: Progressing 11/18/2021 0602 by Alcide Evener, RN Outcome: Progressing Goal: Will not experience complications related to urinary retention 11/18/2021 0605 by Alcide Evener, RN Outcome: Progressing 11/18/2021 0602 by Alcide Evener, RN Outcome: Progressing   Problem: Pain Managment: Goal: General experience of comfort will improve 11/18/2021 0605 by Alcide Evener, RN Outcome: Progressing 11/18/2021 0602 by Alcide Evener, RN Outcome: Progressing   Problem: Safety: Goal: Ability to remain free from injury will improve 11/18/2021 0605 by Alcide Evener, RN Outcome: Progressing 11/18/2021 0602 by Alcide Evener, RN Outcome: Progressing   Problem: Skin Integrity: Goal: Risk for impaired skin integrity will decrease 11/18/2021 0605 by Alcide Evener, RN Outcome: Progressing 11/18/2021 0602 by Alcide Evener, RN Outcome: Progressing

## 2021-11-18 NOTE — Evaluation (Signed)
Physical Therapy Evaluation Patient Details Name: Yvonne Bailey MRN: [id number] DOB: [birth year] Today's Date: 11/18/2021  History of Present Illness  Pt is an 81 y.o. female admitted 9/8 with UTI and acute metabolic encephalopathy. PMH: previous CVA with residual aphasia, coronary artery disease, chronic kidney disease stage III, atrial fibrillation, essential hypertension, history of DVT on chronic anticoagulation, hyperlipoidemia and hypothyroidism   Clinical Impression  Pt admitted with above diagnosis. PTA pt lived at home with family, mod I mobility with RW. Pt currently with functional limitations due to the deficits listed below (see PT Problem List). On eval, pt required mod assist bed mobility, mod assist sit to stand, and min assist amb 25' with RW. She presents with deficits in strength, balance, and activity tolerance. Pt will benefit from skilled PT to increase their independence and safety with mobility to allow discharge to the venue listed below.  Pt adamantly refusing SNF. If family confirms they are able to provide needed level of assist, pt would be appropriate for home with HHPT and 24-hour family assist.        Recommendations for follow up therapy are one component of a multi-disciplinary discharge planning process, led by the attending physician.  Recommendations may be updated based on patient status, additional functional criteria and insurance authorization.  Follow Up Recommendations Skilled nursing-short term rehab (<3 hours/day) Can patient physically be transported by private vehicle: Yes    Assistance Recommended at Discharge Frequent or constant Supervision/Assistance  Patient can return home with the following  Direct supervision/assist for medications management;Assist for transportation;A lot of help with walking and/or transfers;A lot of help with bathing/dressing/bathroom;Help with stairs or ramp for entrance    Equipment Recommendations None  recommended by PT  Recommendations for Other Services       Functional Status Assessment Patient has had a recent decline in their functional status and demonstrates the ability to make significant improvements in function in a reasonable and predictable amount of time.     Precautions / Restrictions Precautions Precautions: Fall Restrictions Weight Bearing Restrictions: Yes      Mobility  Bed Mobility Overal bed mobility: Needs Assistance Bed Mobility: Supine to Sit     Supine to sit: Mod assist, HOB elevated     General bed mobility comments: increased time, multimodal cues for sequencing    Transfers Overall transfer level: Needs assistance Equipment used: Rolling walker (2 wheels) Transfers: Sit to/from Stand Sit to Stand: Mod assist           General transfer comment: cues for hand placement and sequencing, increased time, assist to power up and stabilize balance    Ambulation/Gait Ambulation/Gait assistance: Min assist Gait Distance (Feet): 25 Feet Assistive device: Rolling walker (2 wheels) Gait Pattern/deviations: Step-through pattern, Decreased stride length, Trunk flexed, Shuffle Gait velocity: decreased Gait velocity interpretation: <1.8 ft/sec, indicate of risk for recurrent falls   General Gait Details: Initial shuffle gait with improved foot clearance with gait progression. LOB x 2 requiring min assist to recover.  Stairs            Wheelchair Mobility    Modified Rankin (Stroke Patients Only)       Balance Overall balance assessment: Needs assistance Sitting-balance support: Feet supported, No upper extremity supported Sitting balance-Leahy Scale: Fair   Postural control: Posterior lean Standing balance support: Bilateral upper extremity supported, During functional activity, Reliant on assistive device for balance Standing balance-Leahy Scale: Poor  Pertinent Vitals/Pain Pain  Assessment Pain Assessment: No/denies pain    Home Living Family/patient expects to be discharged to:: Private residence Living Arrangements: Children;Other relatives (granddaughter) Available Help at Discharge: Family;Available PRN/intermittently;Available 24 hours/day Type of Home: House Home Access: Stairs to enter Entrance Stairs-Rails: Right Entrance Stairs-Number of Steps: 6   Home Layout: One level Home Equipment: Agricultural consultant (2 wheels);BSC/3in1;Shower seat;Cane - single point      Prior Function Prior Level of Function : Independent/Modified Independent;Needs assist             Mobility Comments: RW for amb ADLs Comments: sponge bath alternating with bath in shower sitting, granddaughter assists as needed     Hand Dominance   Dominant Hand: Right    Extremity/Trunk Assessment   Upper Extremity Assessment Upper Extremity Assessment: Generalized weakness    Lower Extremity Assessment Lower Extremity Assessment: Generalized weakness    Cervical / Trunk Assessment Cervical / Trunk Assessment: Kyphotic  Communication   Communication: HOH  Cognition Arousal/Alertness: Awake/alert Behavior During Therapy: WFL for tasks assessed/performed Overall Cognitive Status: No family/caregiver present to determine baseline cognitive functioning Area of Impairment: Safety/judgement, Problem solving                         Safety/Judgement: Decreased awareness of safety, Decreased awareness of deficits   Problem Solving: Slow processing, Requires verbal cues, Requires tactile cues, Difficulty sequencing General Comments: adamant about going home even though she is requiring physical assist to complete all mobility.        General Comments General comments (skin integrity, edema, etc.): Pt incontinent of urine with standing.    Exercises     Assessment/Plan    PT Assessment Patient needs continued PT services  PT Problem List Decreased  strength;Decreased mobility;Decreased safety awareness;Decreased activity tolerance;Decreased balance;Decreased cognition       PT Treatment Interventions Therapeutic activities;DME instruction;Gait training;Therapeutic exercise;Patient/family education;Balance training;Stair training;Functional mobility training    PT Goals (Current goals can be found in the Care Plan section)  Acute Rehab PT Goals Patient Stated Goal: home PT Goal Formulation: With patient Time For Goal Achievement: 12/02/21 Potential to Achieve Goals: Good    Frequency Min 3X/week     Co-evaluation               AM-PAC PT "6 Clicks" Mobility  Outcome Measure Help needed turning from your back to your side while in a flat bed without using bedrails?: A Lot Help needed moving from lying on your back to sitting on the side of a flat bed without using bedrails?: A Lot Help needed moving to and from a bed to a chair (including a wheelchair)?: A Lot Help needed standing up from a chair using your arms (e.g., wheelchair or bedside chair)?: A Lot Help needed to walk in hospital room?: A Little Help needed climbing 3-5 steps with a railing? : Total 6 Click Score: 12    End of Session Equipment Utilized During Treatment: Gait belt Activity Tolerance: Patient tolerated treatment well Patient left: in chair;with call bell/phone within reach Nurse Communication: Mobility status PT Visit Diagnosis: Muscle weakness (generalized) (M62.81);Difficulty in walking, not elsewhere classified (R26.2)    Time: 1751-0258 PT Time Calculation (min) (ACUTE ONLY): 40 min   Charges:   PT Evaluation $PT Eval Moderate Complexity: 1 Mod PT Treatments $Gait Training: 8-22 mins $Therapeutic Activity: 8-22 mins        Aida Raider, PT  Office # [phone] Pager [phone]  Ilda Foil 11/18/2021, 12:28 PM

## 2021-11-19 DIAGNOSIS — N179 Acute kidney failure, unspecified: Secondary | ICD-10-CM | POA: Diagnosis not present

## 2021-11-19 DIAGNOSIS — N3001 Acute cystitis with hematuria: Secondary | ICD-10-CM | POA: Diagnosis not present

## 2021-11-19 DIAGNOSIS — G9341 Metabolic encephalopathy: Secondary | ICD-10-CM | POA: Diagnosis not present

## 2021-11-19 DIAGNOSIS — I82409 Acute embolism and thrombosis of unspecified deep veins of unspecified lower extremity: Secondary | ICD-10-CM | POA: Diagnosis not present

## 2021-11-19 LAB — BASIC METABOLIC PANEL
Anion gap: 7 (ref 5–15)
BUN: 15 mg/dL (ref 8–23)
CO2: 23 mmol/L (ref 22–32)
Calcium: 8.7 mg/dL — ABNORMAL LOW (ref 8.9–10.3)
Chloride: 111 mmol/L (ref 98–111)
Creatinine, Ser: 1 mg/dL (ref 0.44–1.00)
GFR, Estimated: 57 mL/min — ABNORMAL LOW (ref >60)
Glucose, Bld: 81 mg/dL (ref 70–99)
Potassium: 3.9 mmol/L (ref 3.5–5.1)
Sodium: 141 mmol/L (ref 135–145)

## 2021-11-19 LAB — CBC
HCT: 37.4 % (ref 36.0–46.0)
Hemoglobin: 11.9 g/dL — ABNORMAL LOW (ref 12.0–15.0)
MCH: 31.9 pg (ref 26.0–34.0)
MCHC: 31.8 g/dL (ref 30.0–36.0)
MCV: 100.3 fL — ABNORMAL HIGH (ref 80.0–100.0)
Platelets: 284 10*3/uL (ref 150–400)
RBC: 3.73 MIL/uL — ABNORMAL LOW (ref 3.87–5.11)
RDW: 13.6 % (ref 11.5–15.5)
WBC: 7.8 10*3/uL (ref 4.0–10.5)
nRBC: 0 % (ref 0.0–0.2)

## 2021-11-19 LAB — MAGNESIUM: Magnesium: 1.7 mg/dL (ref 1.7–2.4)

## 2021-11-19 NOTE — [place] FL2 (Signed)
[place] MEDICAID FL2 LEVEL OF CARE SCREENING TOOL     IDENTIFICATION  Patient Name: Yvonne Bailey Birthdate: 1941-03-03 Sex: female Admission Date (Current Location): 11/16/2021  [facility] and [place] Number:  Producer, television/film/video and Address:  [name], [address] [place], [place] [address]      Provider Number: [id number]  Attending Physician Name and Address:  Joycelyn Das, MD  Relative Name and Phone Number:  [name], son - [phone]    Current Level of Care: Hospital Recommended Level of Care: Skilled Nursing Facility Prior Approval Number:    Date Approved/Denied:   PASRR Number: [id number]  Discharge Plan: SNF    Current Diagnoses: Patient Active Problem List   Diagnosis Date Noted   Hypomagnesemia 11/19/2021   Hypokalemia 11/17/2021   Catheter-associated urinary tract infection (HCC) 09/03/2021   Acute metabolic encephalopathy 09/03/2021   Hematuria 03/18/2021   AKI (acute kidney injury) (HCC) 10/31/2020   Acute cystitis 10/17/2020   Rash 08/29/2020   TIA (transient ischemic attack) 06/10/2020   Anxiety and depression 03/28/2020   Overactive bladder 03/28/2020   Lack of motivation 12/21/2019   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   Aphasia as late effect of cerebrovascular accident (CVA)    Acute ischemic cerebrovascular accident (CVA) involving left middle cerebral artery territory [facility])    Coronary artery disease involving native coronary artery without angina pectoris    Stage 3 chronic kidney disease (HCC)    Prediabetes 07/28/2016   DOE (dyspnea on exertion) 07/09/2016   Gait instability 07/09/2016   Cardiomyopathy, ischemic    Chronic systolic heart failure (HCC) 08/21/2015   Bilateral leg edema 07/19/2015   Atrial fibrillation (HCC) 07/19/2015   Urinary urgency 03/30/2015   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   Small vessel disease, cerebrovascular 12/17/2012   CAROTID BRUIT, RIGHT 08/10/2008    Peripheral vascular disease (HCC) 06/04/2007   Diverticulosis of large intestine 06/04/2007   Hypothyroidism 02/24/2007   Dyslipidemia 02/24/2007   Essential hypertension 02/24/2007   Acute thromboembolism of deep veins of lower extremity (HCC) 01/21/2007    Orientation RESPIRATION BLADDER Height & Weight     Self  Normal External catheter Weight: 72.9 kg Height:  5\' 6"  (167.6 cm)  BEHAVIORAL SYMPTOMS/MOOD NEUROLOGICAL BOWEL NUTRITION STATUS      Incontinent Diet (Heart Healthy)  AMBULATORY STATUS COMMUNICATION OF NEEDS Skin   Limited Assist Verbally Normal                       Personal Care Assistance Level of Assistance  Bathing, Feeding, Dressing Bathing Assistance: Limited assistance Feeding assistance: Limited assistance Dressing Assistance: Limited assistance     Functional Limitations Info  Sight, Hearing, Speech Sight Info: Adequate Hearing Info: Adequate Speech Info: Adequate    SPECIAL CARE FACTORS FREQUENCY  PT (By licensed PT), OT (By licensed OT)     PT Frequency: 3-5 x per week OT Frequency: 3-5 x per week            Contractures Contractures Info: Not present    Additional Factors Info  Code Status, Allergies, Psychotropic Code Status Info: Full code Allergies Info: Definity, Pletal Psychotropic Info: Zoloft         Current Medications (11/19/2021):  This is the current hospital active medication list Current Facility-Administered Medications  Medication Dose Route Frequency Provider Last Rate Last Admin   acetaminophen (TYLENOL) tablet 650 mg  650 mg Oral Q6H PRN 01/19/2022, MD  Or   acetaminophen (TYLENOL) suppository 650 mg  650 mg Rectal Q6H PRN Rometta Emery, MD       apixaban (ELIQUIS) tablet 2.5 mg  2.5 mg Oral BID Earlie Lou L, MD   2.5 mg at 11/19/21 0945   aspirin EC tablet 81 mg  81 mg Oral Daily Pokhrel, Laxman, MD   81 mg at 11/19/21 0946   atorvastatin (LIPITOR) tablet 80 mg  80 mg Oral Daily Pokhrel,  Laxman, MD   80 mg at 11/19/21 0946   benazepril (LOTENSIN) tablet 30 mg  30 mg Oral Daily Pokhrel, Laxman, MD   30 mg at 11/19/21 0950   cefTRIAXone (ROCEPHIN) 1 g in sodium chloride 0.9 % 100 mL IVPB  1 g Intravenous Q24H Earlie Lou L, MD 200 mL/hr at 11/18/21 2149 1 g at 11/18/21 2149   levothyroxine (SYNTHROID) tablet 88 mcg  88 mcg Oral QAC breakfast Pokhrel, Laxman, MD   88 mcg at 11/19/21 0533   magnesium oxide (MAG-OX) tablet 400 mg  400 mg Oral BID Pokhrel, Laxman, MD   400 mg at 11/19/21 0945   mirabegron ER (MYRBETRIQ) tablet 25 mg  25 mg Oral Daily Pokhrel, Laxman, MD   25 mg at 11/19/21 0946   nitroGLYCERIN (NITROSTAT) SL tablet 0.4 mg  0.4 mg Sublingual Q5 min PRN Pokhrel, Laxman, MD       ondansetron (ZOFRAN) tablet 4 mg  4 mg Oral Q6H PRN Rometta Emery, MD       Or   ondansetron (ZOFRAN) injection 4 mg  4 mg Intravenous Q6H PRN Garba, Mohammad L, MD       polyethylene glycol (MIRALAX / GLYCOLAX) packet 17 g  17 g Oral Daily PRN Pokhrel, Laxman, MD       sertraline (ZOLOFT) tablet 100 mg  100 mg Oral Daily Pokhrel, Laxman, MD   100 mg at 11/19/21 0945   spironolactone (ALDACTONE) tablet 25 mg  25 mg Oral Daily Pokhrel, Laxman, MD   25 mg at 11/19/21 0945     Discharge Medications: Please see discharge summary for a list of discharge medications.  Relevant Imaging Results:  Relevant Lab Results:   Additional Information SSN [id number]  Janae Bridgeman, RN

## 2021-11-19 NOTE — Progress Notes (Signed)
PROGRESS NOTE    [name]  YFV:[id number] DOB: 1940/05/02 DOA: 11/16/2021 PCP: Pincus Sanes, MD    Brief Narrative:  Yvonne Bailey is a 81 y.o. female with past medical history significant for previous history of CVA with residual aphasia, CAD, chronic kidney disease stage III, atrial fibrillation, hypertension, history of DVT on chronic anticoagulation, hyperlipidemia and hypothyroidism was brought in the hospital with altered mental status and increased confusion.  Patient had been having urinary urgency at home.  In the ED, patient had urinalysis done which showed evidence of infection.  Patient was then admitted hospital for metabolic encephalopathy and UTI.  Assessment and plan. Principal Problem:   Acute metabolic encephalopathy Active Problems:   Peripheral vascular disease (HCC)   Acute thromboembolism of deep veins of lower extremity (HCC)   Atrial fibrillation (HCC)   Coronary artery disease involving native coronary artery without angina pectoris   Stage 3 chronic kidney disease (HCC)   Aphasia as late effect of cerebrovascular accident (CVA)   Anxiety and depression   Acute cystitis   AKI (acute kidney injury) (HCC)   Hypokalemia   Hypomagnesemia    Acute metabolic encephalopathy:  Likely secondary to UTI.  Continue Rocephin.  Ammonia at 23.  Cortisol 11.7.  Temperature max of 98.2 Fahrenheit.  Severe hypokalemia.  improved after replacement.  Latest potassium of 3.9.  Hypomagnesemia.  improved after replacement.  Latest magnesium of 1.7.  acute cystitis with hematuria:  Continue IV Rocephin.  Culture report not available.  WBC has normalized to 7.8.    history of CVA with residual aphasia.  No difficulty swallowing at this time.  anxiety with depression: On sertraline at home.  Will resume.   atrial fibrillation: Rate controlled at this time.  On Eliquis at home.  Not on nodal blockers.   peripheral vascular disease: Continue aspirin Lipitor and  Eliquis.  We will continue.  coronary artery disease: No chest pain.  Continue aspirin, Lipitor, ACE inhibitor's and spironolactone.   chronic kidney disease stage IIIa: Stable.  Continue monitor creatinine closely.  Hypothyroidism.  On Synthroid 88 mcg.  TSH elevated at 11.8.  Might need to reassess as outpatient and adjust Synthroid dose after acute illnesses taken care of..  Weakness, debility.  Physical therapy has seen the patient and recommended skilled nursing facility placement.  Spoke with the patient's son about it.     DVT prophylaxis: apixaban (ELIQUIS) tablet 2.5 mg Start: 11/16/21 2245 apixaban (ELIQUIS) tablet 2.5 mg   Code Status:     Code Status: Full Code  Disposition: Physical therapy has recommended skilled nursing facility placement.  Status is: Inpatient  Remains inpatient appropriate because: IV antibiotic, , debility, deconditioning, need for rehabitation   Family Communication:  Spoke with the patient's son on 11/18/2021.  He wishes rehabilitation skilled nursing facility on discharge.  Consultants:  None  Procedures:  None  Antimicrobials:  Rocephin IV  Anti-infectives (From admission, onward)    Start     Dose/Rate Route Frequency Ordered Stop   11/17/21 2000  cefTRIAXone (ROCEPHIN) 1 g in sodium chloride 0.9 % 100 mL IVPB        1 g 200 mL/hr over 30 Minutes Intravenous Every 24 hours 11/16/21 2151     11/16/21 1815  cefTRIAXone (ROCEPHIN) 2 g in sodium chloride 0.9 % 100 mL IVPB        2 g 200 mL/hr over 30 Minutes Intravenous  Once 11/16/21 1807 11/16/21 2225  Subjective: Today, patient was seen and examined at bedside.  Denies any nausea vomiting fever chills or rigor.   Objective: Vitals:   11/18/21 1640 11/18/21 1932 11/19/21 0352 11/19/21 0801  BP: 120/66 125/66 (!) 164/79 (!) 145/82  Pulse: 68 77 68 75  Resp: 15 16 18 16   Temp: (!) 97.4 F (36.3 C) 98 F (36.7 C) 98 F (36.7 C) 97.6 F (36.4 C)  TempSrc: Oral Oral Oral  Oral  SpO2: 100% 100% 100% 96%  Weight:      Height:        Intake/Output Summary (Last 24 hours) at 11/19/2021 1253 Last data filed at 11/18/2021 2000 Gross per 24 hour  Intake 880 ml  Output 300 ml  Net 580 ml    Filed Weights   11/16/21 2155  Weight: 72.9 kg    Physical Examination: Body mass index is 25.94 kg/m.   General:  Average built, not in obvious distress, elderly female, deconditioned and frail. HENT:   No scleral pallor or icterus noted. Oral mucosa is moist.  Chest:  Clear breath sounds.  Diminished breath sounds bilaterally. No crackles or wheezes.  CVS: S1 &S2 heard. No murmur.  Regular rate and rhythm. Abdomen: Soft, nontender, nondistended.  Bowel sounds are heard.   Extremities: No cyanosis, clubbing or edema.  Peripheral pulses are palpable. Psych: Alert, awake and oriented to time and place, CNS:  No cranial nerve deficits.  Moves all extremities. Skin: Warm and dry.  No rashes noted.  Data Reviewed:   CBC: Recent Labs  Lab 11/16/21 1510 11/16/21 1638 11/17/21 0632 11/18/21 0147 11/19/21 0905  WBC 10.8*  --  8.2 11.6* 7.8  NEUTROABS 7.6  --   --   --   --   HGB 12.8 12.9 11.9* 11.9* 11.9*  HCT 40.7 38.0 36.3 36.6 37.4  MCV 103.6*  --  98.9 100.3* 100.3*  PLT 277  --  273 271 284     Basic Metabolic Panel: Recent Labs  Lab 11/16/21 1510 11/16/21 1638 11/17/21 0632 11/18/21 0147 11/19/21 0905  NA 143 141 143 141 141  K 3.6 3.3* 2.8* 3.5 3.9  CL 106  --  110 111 111  CO2 24  --  23 23 23   GLUCOSE 97  --  78 99 81  BUN 24*  --  16 17 15   CREATININE 1.26*  --  0.97 1.17* 1.00  CALCIUM 9.0  --  8.3* 8.5* 8.7*  MG  --   --   --  1.6* 1.7     Liver Function Tests: Recent Labs  Lab 11/16/21 1510 11/17/21 0632  AST 31 27  ALT 16 15  ALKPHOS 97 82  BILITOT 1.1 0.9  PROT 7.6 6.2*  ALBUMIN 3.6 3.0*      Radiology Studies: No results found.    LOS: 3 days    , MD [facility] Available via Epic  secure chat 7am-7pm After these hours, please refer to coverage provider listed on [email or web address] 11/19/2021, 12:53 PM

## 2021-11-19 NOTE — Telephone Encounter (Signed)
Noted  

## 2021-11-19 NOTE — Plan of Care (Signed)

## 2021-11-19 NOTE — TOC Initial Note (Addendum)
Transition of Care [facility]) - Initial/Assessment Note    Patient Details  Name: Yvonne Bailey MRN: [id number] Date of Birth: 20-Sep-1940  Transition of Care [facility]) CM/SW Contact:    Janae Bridgeman, RN Phone Number: 11/19/2021, 10:32 AM  Clinical Narrative:                 CM called and spoke with the patient's son, [name] this morning to discuss transitions of care needs.  The patient lives at home with her granddaughter and is currently active with [facility] PT and the patient's family provides private pay personal care assistance through [facility] 12 hours per week.  The patient's granddaughter works outside of the home and the patient lives at home during the day without supervision at times.  The patient's son is requesting SNF placement at [facility] at this time.  DME - patient currently has RW at the home.  SNF work up will be completed and I will follow up with the patient's son for bed offers and insurance authorization for SNF placement.  I called and left a message with Lowella Bandy, CM at [facility] to review clinicals for possible bed offer.  Expected Discharge Plan: Skilled Nursing Facility Barriers to Discharge: Continued Medical Work up   Patient Goals and CMS Choice Patient states their goals for this hospitalization and ongoing recovery are:: to get better CMS Medicare.gov Compare Post Acute Care list provided to:: Patient Represenative (must comment) [name], son) Choice offered to / list presented to : Adult Children  Expected Discharge Plan and Services Expected Discharge Plan: Skilled Nursing Facility   Discharge Planning Services: CM Consult Post Acute Care Choice: Skilled Nursing Facility Living arrangements for the past 2 months: Single Family Home                             HH Agency: [facility]        Prior Living Arrangements/Services Living arrangements for the past 2 months: Single Family  Home Lives with:: Relatives (Lives with granddaughter at the patient's home) Patient language and need for interpreter reviewed:: Yes Do you feel safe going back to the place where you live?: No      Need for Family Participation in Patient Care: Yes (Comment) Care giver support system in place?: Yes (comment) Current home services: DME Criminal Activity/Legal Involvement Pertinent to Current Situation/Hospitalization: No - Comment as needed  Activities of Daily Living Home Assistive Devices/Equipment: Dan Humphreys (specify type) ADL Screening (condition at time of admission) Patient's cognitive ability adequate to safely complete daily activities?: No Is the patient deaf or have difficulty hearing?: Yes Does the patient have difficulty seeing, even when wearing glasses/contacts?: No Does the patient have difficulty concentrating, remembering, or making decisions?: Yes Patient able to express need for assistance with ADLs?: Yes Does the patient have difficulty dressing or bathing?: Yes Independently performs ADLs?: No Communication: Independent Dressing (OT): Needs assistance Is this a change from baseline?: Pre-admission baseline Grooming: Needs assistance Is this a change from baseline?: Pre-admission baseline Feeding: Independent Bathing: Needs assistance Is this a change from baseline?: Pre-admission baseline Toileting: Needs assistance Is this a change from baseline?: Pre-admission baseline In/Out Bed: Needs assistance Is this a change from baseline?: Pre-admission baseline Walks in Home: Independent with device (comment) Dan Humphreys) Does the patient have difficulty walking or climbing stairs?: Yes Weakness of Legs: Both Weakness of Arms/Hands: None  Permission Sought/Granted Permission sought  to share information with : Case Manager Permission granted to share information with : Yes, Verbal Permission Granted     Permission granted to share info w AGENCY: SNF - preferrably [facility]  Permission granted to share info w Relationship: [name], son - [phone]     Emotional Assessment Appearance:: Appears stated age     Orientation: : Oriented to Self Alcohol / Substance Use: Not Applicable Psych Involvement: No (comment)  Admission diagnosis:  Acute cystitis without hematuria [N30.00] AKI (acute kidney injury) (HCC) [N17.9] Acute metabolic encephalopathy [G93.41] Patient Active Problem List   Diagnosis Date Noted   Hypomagnesemia 11/19/2021   Hypokalemia 11/17/2021   Catheter-associated urinary tract infection (HCC) 09/03/2021   Acute metabolic encephalopathy 09/03/2021   Hematuria 03/18/2021   AKI (acute kidney injury) (HCC) 10/31/2020   Acute cystitis 10/17/2020   Rash 08/29/2020   TIA (transient ischemic attack) 06/10/2020   Anxiety and depression 03/28/2020   Overactive bladder 03/28/2020   Lack of motivation 12/21/2019   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   Aphasia as late effect of cerebrovascular accident (CVA)    Acute ischemic cerebrovascular accident (CVA) involving left middle cerebral artery territory [facility])    Coronary artery disease involving native coronary artery without angina pectoris    Stage 3 chronic kidney disease (HCC)    Prediabetes 07/28/2016   DOE (dyspnea on exertion) 07/09/2016   Gait instability 07/09/2016   Cardiomyopathy, ischemic    Chronic systolic heart failure (HCC) 08/21/2015   Bilateral leg edema 07/19/2015   Atrial fibrillation (HCC) 07/19/2015   Urinary urgency 03/30/2015   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   Small vessel disease, cerebrovascular 12/17/2012   CAROTID BRUIT, RIGHT 08/10/2008   Peripheral vascular disease (HCC) 06/04/2007   Diverticulosis of large intestine 06/04/2007   Hypothyroidism 02/24/2007   Dyslipidemia 02/24/2007   Essential hypertension 02/24/2007   Acute thromboembolism of deep veins of lower extremity (HCC) 01/21/2007   PCP:  Pincus Sanes, MD Pharmacy:   [facility]  [facility] [phone] [place] [address], [place] - [address] AT [facility] OF [address] [place] [address] Phone: [phone] Fax: [phone]  [facility] - [place], [place] - [address] [place] [address] Phone: [phone] Fax: [phone]     Social Determinants of Health (SDOH) Interventions    Readmission Risk Interventions    11/19/2021   10:32 AM  Readmission Risk Prevention Plan  Transportation Screening Complete  PCP or Specialist Appt within 5-7 Days Complete  Home Care Screening Complete  Medication Review (RN CM) Complete

## 2021-11-20 DIAGNOSIS — N3001 Acute cystitis with hematuria: Secondary | ICD-10-CM | POA: Diagnosis not present

## 2021-11-20 DIAGNOSIS — I4819 Other persistent atrial fibrillation: Secondary | ICD-10-CM | POA: Diagnosis not present

## 2021-11-20 DIAGNOSIS — E785 Hyperlipidemia, unspecified: Secondary | ICD-10-CM | POA: Diagnosis not present

## 2021-11-20 DIAGNOSIS — I130 Hypertensive heart and chronic kidney disease with heart failure and stage 1 through stage 4 chronic kidney disease, or unspecified chronic kidney disease: Secondary | ICD-10-CM | POA: Diagnosis not present

## 2021-11-20 DIAGNOSIS — I2510 Atherosclerotic heart disease of native coronary artery without angina pectoris: Secondary | ICD-10-CM | POA: Diagnosis not present

## 2021-11-20 DIAGNOSIS — F419 Anxiety disorder, unspecified: Secondary | ICD-10-CM | POA: Diagnosis not present

## 2021-11-20 DIAGNOSIS — Z79899 Other long term (current) drug therapy: Secondary | ICD-10-CM | POA: Diagnosis not present

## 2021-11-20 DIAGNOSIS — R2681 Unsteadiness on feet: Secondary | ICD-10-CM | POA: Diagnosis not present

## 2021-11-20 DIAGNOSIS — I4891 Unspecified atrial fibrillation: Secondary | ICD-10-CM | POA: Diagnosis not present

## 2021-11-20 DIAGNOSIS — R531 Weakness: Secondary | ICD-10-CM | POA: Diagnosis not present

## 2021-11-20 DIAGNOSIS — I739 Peripheral vascular disease, unspecified: Secondary | ICD-10-CM | POA: Diagnosis not present

## 2021-11-20 DIAGNOSIS — N390 Urinary tract infection, site not specified: Secondary | ICD-10-CM | POA: Diagnosis not present

## 2021-11-20 DIAGNOSIS — I5022 Chronic systolic (congestive) heart failure: Secondary | ICD-10-CM | POA: Diagnosis not present

## 2021-11-20 DIAGNOSIS — I69828 Other speech and language deficits following other cerebrovascular disease: Secondary | ICD-10-CM | POA: Diagnosis not present

## 2021-11-20 DIAGNOSIS — G9341 Metabolic encephalopathy: Secondary | ICD-10-CM | POA: Diagnosis not present

## 2021-11-20 DIAGNOSIS — R2689 Other abnormalities of gait and mobility: Secondary | ICD-10-CM | POA: Diagnosis not present

## 2021-11-20 DIAGNOSIS — A419 Sepsis, unspecified organism: Secondary | ICD-10-CM | POA: Diagnosis not present

## 2021-11-20 DIAGNOSIS — B964 Proteus (mirabilis) (morganii) as the cause of diseases classified elsewhere: Secondary | ICD-10-CM | POA: Diagnosis not present

## 2021-11-20 DIAGNOSIS — Z7401 Bed confinement status: Secondary | ICD-10-CM | POA: Diagnosis not present

## 2021-11-20 DIAGNOSIS — I10 Essential (primary) hypertension: Secondary | ICD-10-CM | POA: Diagnosis not present

## 2021-11-20 DIAGNOSIS — M6281 Muscle weakness (generalized): Secondary | ICD-10-CM | POA: Diagnosis not present

## 2021-11-20 DIAGNOSIS — E876 Hypokalemia: Secondary | ICD-10-CM | POA: Diagnosis not present

## 2021-11-20 DIAGNOSIS — N179 Acute kidney failure, unspecified: Secondary | ICD-10-CM | POA: Diagnosis not present

## 2021-11-20 DIAGNOSIS — I129 Hypertensive chronic kidney disease with stage 1 through stage 4 chronic kidney disease, or unspecified chronic kidney disease: Secondary | ICD-10-CM | POA: Diagnosis not present

## 2021-11-20 DIAGNOSIS — M2550 Pain in unspecified joint: Secondary | ICD-10-CM | POA: Diagnosis not present

## 2021-11-20 DIAGNOSIS — R112 Nausea with vomiting, unspecified: Secondary | ICD-10-CM | POA: Diagnosis not present

## 2021-11-20 DIAGNOSIS — I69320 Aphasia following cerebral infarction: Secondary | ICD-10-CM | POA: Diagnosis not present

## 2021-11-20 DIAGNOSIS — E039 Hypothyroidism, unspecified: Secondary | ICD-10-CM | POA: Diagnosis not present

## 2021-11-20 DIAGNOSIS — R1110 Vomiting, unspecified: Secondary | ICD-10-CM | POA: Diagnosis not present

## 2021-11-20 MED ORDER — MAGNESIUM OXIDE -MG SUPPLEMENT 400 (240 MG) MG PO TABS: 400.0000 mg | ORAL_TABLET | Freq: Every day | ORAL | 0 refills | Status: AC

## 2021-11-20 NOTE — Discharge Summary (Addendum)
Physician Discharge Summary  Yvonne Bailey [id number] DOB: 1941/01/12 DOA: 11/16/2021  PCP: Binnie Rail, MD  Admit date: 11/16/2021 Discharge date: 11/20/2021  Admitted From: Home  Discharge disposition: Skilled nursing facility  Recommendations for Outpatient Follow-Up:   Follow up with your primary care provider at the skilled nursing facility in 3 to 5 days Check CBC, BMP, magnesium in the next visit Check TSH in 3 to 4 weeks to see if Synthroid dose needs to be adjusted.   Discharge Diagnosis:   Principal Problem:   Acute metabolic encephalopathy Active Problems:   Peripheral vascular disease ([place])   Acute thromboembolism of deep veins of lower extremity (HCC)   Atrial fibrillation (HCC)   Coronary artery disease involving native coronary artery without angina pectoris   Stage 3 chronic kidney disease (HCC)   Aphasia as late effect of cerebrovascular accident (CVA)   Anxiety and depression Resolved   Acute cystitis   Hypokalemia   Hypomagnesemia    Discharge Condition: Improved.  Diet recommendation: Low sodium, heart healthy.    Wound care: None.  Code status: Full.   History of Present Illness:   Yvonne Bailey is a 81 y.o. female with past medical history significant for previous history of CVA with residual aphasia, CAD, chronic kidney disease stage III, atrial fibrillation, hypertension, history of DVT on chronic anticoagulation, hyperlipidemia and hypothyroidism was brought in the hospital with altered mental status and increased confusion.  Patient had been having urinary urgency at home.  In the ED, patient had urinalysis done which showed evidence of infection.  Patient was then admitted hospital for metabolic encephalopathy and UTI.  Hospital Course:   Following conditions were addressed during hospitalization as listed below,  Acute metabolic encephalopathy:  Likely secondary to UTI.  Resolved at this time.  Continue Rocephin.   Ammonia at 23.  Cortisol 11.7.  Completed treatment for UTI.   Severe hypokalemia.  improved after replacement.  Latest potassium of 3.9.   Hypomagnesemia.  improved after replacement.  Latest magnesium of 1.7.  Continue magnesium oxide for 5 more days.   acute cystitis with hematuria:  Acute kidney injury has been ruled out.  Completed IV Rocephin.  Afebrile and no leukocytosis at this time.  Symptomatic.    history of CVA with residual aphasia.  No difficulty swallowing at this time.  Continue aspirin and Eliquis.   anxiety with depression: On sertraline at home.  Will resume on discharge.   atrial fibrillation: Rate controlled at this time.  On Eliquis at home.  Not on nodal blockers.   peripheral vascular disease: Continue aspirin, Lipitor and Eliquis.    History of coronary artery disease: No chest pain.  Continue aspirin, Lipitor, ACE inhibitor's and spironolactone.   chronic kidney disease stage IIIa: Stable.  Continue monitor creatinine periodically outpatient.  Latest creatinine of 1.0   Hypothyroidism.  Continue Synthroid 88 mcg.  TSH elevated at 11.8.  Might need to reassess as outpatient and adjust Synthroid dose after acute illnesses taken care of.   Weakness, debility.  Physical therapy has seen the patient and recommended skilled nursing facility placement.    Disposition.  At this time, patient is stable for disposition to skilled nursing facility.  Medical Consultants:   None.  Procedures:    None Subjective:   Today, patient was seen and examined at bedside.  Denies interval complaints.  Denies any fever chills nausea vomiting abdominal pain.  Discharge Exam:   Vitals:   11/20/21 0350 11/20/21  0747  BP: 130/78 (!) 143/68  Pulse: 82 73  Resp: 19 16  Temp: 99 F (37.2 C) 98 F (36.7 C)  SpO2: 99% 98%   Vitals:   11/19/21 1705 11/19/21 1921 11/20/21 0350 11/20/21 0747  BP: 131/75 129/76 130/78 (!) 143/68  Pulse: 69 81 82 73  Resp: 16 18 19 16    Temp: 98.6 F (37 C) 98 F (36.7 C) 99 F (37.2 C) 98 F (36.7 C)  TempSrc: Oral Oral Oral Oral  SpO2: 100% 98% 99% 98%  Weight:      Height:       General: Alert awake, not in obvious distress, average built, Communicative, appears deconditioned. HENT: pupils equally reacting to light,  No scleral pallor or icterus noted. Oral mucosa is moist.  Chest:  Clear breath sounds.  Diminished breath sounds bilaterally. No crackles or wheezes.  CVS: S1 &S2 heard. No murmur.  Regular rate and rhythm. Abdomen: Soft, nontender, nondistended.  Bowel sounds are heard.   Extremities: No cyanosis, clubbing or edema.  Peripheral pulses are palpable. Psych: Alert, awake and oriented, normal mood CNS:  No cranial nerve deficits.  Generalized weakness noted, moves all extremities, Skin: Warm and dry.  No rashes noted.  The results of significant diagnostics from this hospitalization (including imaging, microbiology, ancillary and laboratory) are listed below for reference.     Diagnostic Studies:   CT HEAD WO CONTRAST  Result Date: 11/16/2021 CLINICAL DATA:  Altered mental status. Possible UTI. History of dementia. EXAM: CT HEAD WITHOUT CONTRAST TECHNIQUE: Contiguous axial images were obtained from the base of the skull through the vertex without intravenous contrast. RADIATION DOSE REDUCTION: This exam was performed according to the departmental dose-optimization program which includes automated exposure control, adjustment of the mA and/or kV according to patient size and/or use of iterative reconstruction technique. COMPARISON:  09/03/2021, 06/07/2020 FINDINGS: Brain: Ventricles and cisterns are within normal. Old left MCA territory infarct. Old infarct adjacent the right frontal horn with mild associated dilatation of the frontal horn. Minimal chronic ischemic microvascular disease. Possible small low bilateral peripheral cerebellar infarcts. No mass, mass effect or shift of midline structures. No acute  hemorrhage. Vascular: No hyperdense vessel or unexpected calcification. Skull: Stable sclerotic focus over the right frontal bone. Sinuses/Orbits: No acute finding. Other: None. IMPRESSION: 1. No acute findings. 2. 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 Possible small low bilateral peripheral cerebellar infarcts. 3. Minimal chronic ischemic microvascular disease. Electronically Signed   By: 06/09/2020 M.D.   On: 11/16/2021 17:18   DG Chest Port 1 View  Result Date: 11/16/2021 CLINICAL DATA:  Altered mental status. EXAM: PORTABLE CHEST 1 VIEW COMPARISON:  Chest x-ray 09/02/2021 FINDINGS: The heart is enlarged. Cardiac stents are present, unchanged. The lungs are clear. There is no pleural effusion or pneumothorax. No acute fractures are seen. IMPRESSION: 1. No acute cardiopulmonary process. 2. Mild cardiomegaly. Electronically Signed   By: 09/04/2021 M.D.   On: 11/16/2021 15:34     Labs:   Basic Metabolic Panel: Recent Labs  Lab 11/16/21 1510 11/16/21 1638 11/17/21 0632 11/18/21 0147 11/19/21 0905  NA 143 141 143 141 141  K 3.6 3.3* 2.8* 3.5 3.9  CL 106  --  110 111 111  CO2 24  --  23 23 23   GLUCOSE 97  --  78 99 81  BUN 24*  --  16 17 15   CREATININE 1.26*  --  0.97 1.17* 1.00  CALCIUM 9.0  --  8.3* 8.5* 8.7*  MG  --   --   --  1.6* 1.7   GFR Estimated Creatinine Clearance: 45.1 mL/min (by C-G formula based on SCr of 1 mg/dL). Liver Function Tests: Recent Labs  Lab 11/16/21 1510 11/17/21 0632  AST 31 27  ALT 16 15  ALKPHOS 97 82  BILITOT 1.1 0.9  PROT 7.6 6.2*  ALBUMIN 3.6 3.0*   No results for input(s): "LIPASE", "AMYLASE" in the last 168 hours. Recent Labs  Lab 11/16/21 1755  AMMONIA 23   Coagulation profile No results for input(s): "INR", "PROTIME" in the last 168 hours.  CBC: Recent Labs  Lab 11/16/21 1510 11/16/21 1638 11/17/21 0632 11/18/21 0147 11/19/21 0905  WBC 10.8*  --  8.2 11.6* 7.8  NEUTROABS 7.6  --   --   --    --   HGB 12.8 12.9 11.9* 11.9* 11.9*  HCT 40.7 38.0 36.3 36.6 37.4  MCV 103.6*  --  98.9 100.3* 100.3*  PLT 277  --  273 271 284   Cardiac Enzymes: No results for input(s): "CKTOTAL", "CKMB", "CKMBINDEX", "TROPONINI" in the last 168 hours. BNP: Invalid input(s): "POCBNP" CBG: Recent Labs  Lab 11/16/21 1634  GLUCAP 96   D-Dimer No results for input(s): "DDIMER" in the last 72 hours. Hgb A1c No results for input(s): "HGBA1C" in the last 72 hours. Lipid Profile No results for input(s): "CHOL", "HDL", "LDLCALC", "TRIG", "CHOLHDL", "LDLDIRECT" in the last 72 hours. Thyroid function studies No results for input(s): "TSH", "T4TOTAL", "T3FREE", "THYROIDAB" in the last 72 hours.  Invalid input(s): "FREET3" Anemia work up No results for input(s): "VITAMINB12", "FOLATE", "FERRITIN", "TIBC", "IRON", "RETICCTPCT" in the last 72 hours. Microbiology No results found for this or any previous visit (from the past 240 hour(s)).   Discharge Instructions:   Discharge Instructions     Call MD for:  persistant nausea and vomiting   Complete by: As directed    Call MD for:  severe uncontrolled pain   Complete by: As directed    Call MD for:  temperature >100.4   Complete by: As directed    Diet - low sodium heart healthy   Complete by: As directed    Discharge instructions   Complete by: As directed    Follow-up with your primary care provider at the skilled nursing facility in 3 to 5 days.   Increase activity slowly   Complete by: As directed       Allergies as of 11/20/2021       Reactions   Definity [perflutren Lipid Microsphere] Other (See Comments)   Patient experienced back pain with injection   Pletal [cilostazol] Other (See Comments)   Made pt "feel funny"        Medication List     TAKE these medications    aspirin EC 81 MG tablet Take 1 tablet (81 mg total) by mouth daily.   atorvastatin 80 MG tablet Commonly known as: LIPITOR TAKE 1 TABLET(80 MG) BY MOUTH  DAILY What changed: See the new instructions.   benazepril 20 MG tablet Commonly known as: LOTENSIN TAKE 1 AND 1/2 TABLETS(30 MG) BY MOUTH DAILY What changed: See the new instructions.   Eliquis 5 MG Tabs tablet Generic drug: apixaban TAKE 1 TABLET(5 MG) BY MOUTH TWICE DAILY What changed: See the new instructions.   estradiol 0.1 MG/GM vaginal cream Commonly known as: ESTRACE Place 1 Applicatorful vaginally once a week.   levothyroxine 88 MCG tablet Commonly known as: SYNTHROID TAKE 1  TABLET(88 MCG) BY MOUTH DAILY What changed: See the new instructions.   magnesium oxide 400 (240 Mg) MG tablet Commonly known as: MAG-OX Take 1 tablet (400 mg total) by mouth daily for 5 days.   Myrbetriq 25 MG Tb24 tablet Generic drug: mirabegron ER TAKE 1 TABLET(25 MG) BY MOUTH DAILY What changed: See the new instructions.   nitroGLYCERIN 0.4 MG SL tablet Commonly known as: Nitrostat Place 1 tablet (0.4 mg total) under the tongue every 5 (five) minutes as needed for chest pain.   polyethylene glycol 17 g packet Commonly known as: MiraLax Take one dose 3 times a day until bowel clear, maximum of 3 consecutive days. What changed:  how much to take how to take this when to take this reasons to take this additional instructions   sertraline 100 MG tablet Commonly known as: ZOLOFT Take 1 tablet (100 mg total) by mouth daily.   spironolactone 25 MG tablet Commonly known as: ALDACTONE TAKE 1 TABLET(25 MG) BY MOUTH DAILY What changed: See the new instructions.        Contact information for after-discharge care     Destination     [facility] Preferred SNF .   Service: Skilled Nursing Contact information: [address] [place] [address] [phone]                      Time coordinating discharge: 39 minutes  Signed:  Willis Holquin  [facility] 11/20/2021, 10:10 AM

## 2021-11-20 NOTE — TOC Transition Note (Addendum)
Transition of Care [facility]) - CM/SW Discharge Note   Patient Details  Name: Yvonne Bailey MRN: [id number] Date of Birth: Feb 01, 1941  Transition of Care [facility]) CM/SW Contact:  Janae Bridgeman, RN Phone Number: 11/20/2021, 10:19 AM   Clinical Narrative:    CM received insurance authorization for placement at [facility] and the patient is medically stable for discharge to the facility today.  I called Lowella Bandy, CM at [facility] and the facility has bed availability today for transfer.  Dr. Tyson Babinski to complete discharge summary and orders for patient's transfer to the facility.  Bedside nursing - please call report to [facility] at [phone] - Room 425.  Discharge summary, orders updated in the hub for the facility.  I called the patient's son and he is aware of patient's discharge to the facility.  The patient was made aware of her discharge to the facility as well.  PTAR transportation to be arranged for transfer to the facility.  PTAR packet to include discharge summary, facesheet and medical necessity form.  PTAR was arranged for transport at 12 noon today.   Final next level of care: Skilled Nursing Facility Barriers to Discharge: Continued Medical Work up   Patient Goals and CMS Choice Patient states their goals for this hospitalization and ongoing recovery are:: to get better CMS Medicare.gov Compare Post Acute Care list provided to:: Patient Represenative (must comment) [name], son) Choice offered to / list presented to : Adult Children  Discharge Placement                       Discharge Plan and Services   Discharge Planning Services: CM Consult Post Acute Care Choice: Skilled Nursing Facility                      [facility] Agency: [facility]        Social Determinants of Health (SDOH) Interventions     Readmission Risk Interventions    11/19/2021   10:32 AM  Readmission Risk Prevention Plan  Transportation Screening Complete   PCP or Specialist Appt within 5-7 Days Complete  Home Care Screening Complete  Medication Review (RN CM) Complete

## 2021-11-20 NOTE — Progress Notes (Signed)
Report called into receiving RN at [facility]. No questions at time of report for writer.

## 2021-11-20 NOTE — Progress Notes (Signed)
Patient discharged off of unit via PTAR to [facility]. Discharge packet given to PTAR.

## 2021-11-21 DIAGNOSIS — I10 Essential (primary) hypertension: Secondary | ICD-10-CM | POA: Diagnosis not present

## 2021-11-21 DIAGNOSIS — I4891 Unspecified atrial fibrillation: Secondary | ICD-10-CM | POA: Diagnosis not present

## 2021-11-21 DIAGNOSIS — E785 Hyperlipidemia, unspecified: Secondary | ICD-10-CM | POA: Diagnosis not present

## 2021-11-21 DIAGNOSIS — N390 Urinary tract infection, site not specified: Secondary | ICD-10-CM | POA: Diagnosis not present

## 2021-11-22 DIAGNOSIS — I5022 Chronic systolic (congestive) heart failure: Secondary | ICD-10-CM | POA: Diagnosis not present

## 2021-11-22 DIAGNOSIS — B964 Proteus (mirabilis) (morganii) as the cause of diseases classified elsewhere: Secondary | ICD-10-CM | POA: Diagnosis not present

## 2021-11-22 DIAGNOSIS — G9341 Metabolic encephalopathy: Secondary | ICD-10-CM | POA: Diagnosis not present

## 2021-11-22 DIAGNOSIS — M6281 Muscle weakness (generalized): Secondary | ICD-10-CM | POA: Diagnosis not present

## 2021-11-22 DIAGNOSIS — I739 Peripheral vascular disease, unspecified: Secondary | ICD-10-CM | POA: Diagnosis not present

## 2021-11-22 DIAGNOSIS — I4819 Other persistent atrial fibrillation: Secondary | ICD-10-CM | POA: Diagnosis not present

## 2021-11-22 DIAGNOSIS — I130 Hypertensive heart and chronic kidney disease with heart failure and stage 1 through stage 4 chronic kidney disease, or unspecified chronic kidney disease: Secondary | ICD-10-CM | POA: Diagnosis not present

## 2021-11-22 DIAGNOSIS — N390 Urinary tract infection, site not specified: Secondary | ICD-10-CM | POA: Diagnosis not present

## 2021-11-26 DIAGNOSIS — I739 Peripheral vascular disease, unspecified: Secondary | ICD-10-CM | POA: Diagnosis not present

## 2021-11-26 DIAGNOSIS — I130 Hypertensive heart and chronic kidney disease with heart failure and stage 1 through stage 4 chronic kidney disease, or unspecified chronic kidney disease: Secondary | ICD-10-CM | POA: Diagnosis not present

## 2021-11-26 DIAGNOSIS — I4819 Other persistent atrial fibrillation: Secondary | ICD-10-CM | POA: Diagnosis not present

## 2021-11-26 DIAGNOSIS — B964 Proteus (mirabilis) (morganii) as the cause of diseases classified elsewhere: Secondary | ICD-10-CM | POA: Diagnosis not present

## 2021-11-26 DIAGNOSIS — N390 Urinary tract infection, site not specified: Secondary | ICD-10-CM | POA: Diagnosis not present

## 2021-11-26 DIAGNOSIS — M6281 Muscle weakness (generalized): Secondary | ICD-10-CM | POA: Diagnosis not present

## 2021-11-26 DIAGNOSIS — I5022 Chronic systolic (congestive) heart failure: Secondary | ICD-10-CM | POA: Diagnosis not present

## 2021-11-26 DIAGNOSIS — G9341 Metabolic encephalopathy: Secondary | ICD-10-CM | POA: Diagnosis not present

## 2021-11-27 DIAGNOSIS — M6281 Muscle weakness (generalized): Secondary | ICD-10-CM | POA: Diagnosis not present

## 2021-11-27 DIAGNOSIS — I4891 Unspecified atrial fibrillation: Secondary | ICD-10-CM | POA: Diagnosis not present

## 2021-11-27 DIAGNOSIS — E039 Hypothyroidism, unspecified: Secondary | ICD-10-CM | POA: Diagnosis not present

## 2021-11-27 DIAGNOSIS — N390 Urinary tract infection, site not specified: Secondary | ICD-10-CM | POA: Diagnosis not present

## 2021-11-29 DIAGNOSIS — I4819 Other persistent atrial fibrillation: Secondary | ICD-10-CM | POA: Diagnosis not present

## 2021-11-29 DIAGNOSIS — G9341 Metabolic encephalopathy: Secondary | ICD-10-CM | POA: Diagnosis not present

## 2021-11-29 DIAGNOSIS — I130 Hypertensive heart and chronic kidney disease with heart failure and stage 1 through stage 4 chronic kidney disease, or unspecified chronic kidney disease: Secondary | ICD-10-CM | POA: Diagnosis not present

## 2021-11-29 DIAGNOSIS — N390 Urinary tract infection, site not specified: Secondary | ICD-10-CM | POA: Diagnosis not present

## 2021-11-29 DIAGNOSIS — I739 Peripheral vascular disease, unspecified: Secondary | ICD-10-CM | POA: Diagnosis not present

## 2021-11-29 DIAGNOSIS — B964 Proteus (mirabilis) (morganii) as the cause of diseases classified elsewhere: Secondary | ICD-10-CM | POA: Diagnosis not present

## 2021-11-29 DIAGNOSIS — M6281 Muscle weakness (generalized): Secondary | ICD-10-CM | POA: Diagnosis not present

## 2021-11-29 DIAGNOSIS — I5022 Chronic systolic (congestive) heart failure: Secondary | ICD-10-CM | POA: Diagnosis not present

## 2021-11-30 DIAGNOSIS — R1110 Vomiting, unspecified: Secondary | ICD-10-CM | POA: Diagnosis not present

## 2021-11-30 DIAGNOSIS — I4891 Unspecified atrial fibrillation: Secondary | ICD-10-CM | POA: Diagnosis not present

## 2021-11-30 DIAGNOSIS — I10 Essential (primary) hypertension: Secondary | ICD-10-CM | POA: Diagnosis not present

## 2021-12-03 DIAGNOSIS — I5022 Chronic systolic (congestive) heart failure: Secondary | ICD-10-CM | POA: Diagnosis not present

## 2021-12-03 DIAGNOSIS — N390 Urinary tract infection, site not specified: Secondary | ICD-10-CM | POA: Diagnosis not present

## 2021-12-03 DIAGNOSIS — M6281 Muscle weakness (generalized): Secondary | ICD-10-CM | POA: Diagnosis not present

## 2021-12-03 DIAGNOSIS — B964 Proteus (mirabilis) (morganii) as the cause of diseases classified elsewhere: Secondary | ICD-10-CM | POA: Diagnosis not present

## 2021-12-03 DIAGNOSIS — G9341 Metabolic encephalopathy: Secondary | ICD-10-CM | POA: Diagnosis not present

## 2021-12-03 DIAGNOSIS — E039 Hypothyroidism, unspecified: Secondary | ICD-10-CM | POA: Diagnosis not present

## 2021-12-03 DIAGNOSIS — R112 Nausea with vomiting, unspecified: Secondary | ICD-10-CM | POA: Diagnosis not present

## 2021-12-03 DIAGNOSIS — I4819 Other persistent atrial fibrillation: Secondary | ICD-10-CM | POA: Diagnosis not present

## 2021-12-03 DIAGNOSIS — I739 Peripheral vascular disease, unspecified: Secondary | ICD-10-CM | POA: Diagnosis not present

## 2021-12-03 DIAGNOSIS — E876 Hypokalemia: Secondary | ICD-10-CM | POA: Diagnosis not present

## 2021-12-03 DIAGNOSIS — I130 Hypertensive heart and chronic kidney disease with heart failure and stage 1 through stage 4 chronic kidney disease, or unspecified chronic kidney disease: Secondary | ICD-10-CM | POA: Diagnosis not present

## 2021-12-06 DIAGNOSIS — I5022 Chronic systolic (congestive) heart failure: Secondary | ICD-10-CM | POA: Diagnosis not present

## 2021-12-06 DIAGNOSIS — M6281 Muscle weakness (generalized): Secondary | ICD-10-CM | POA: Diagnosis not present

## 2021-12-06 DIAGNOSIS — G9341 Metabolic encephalopathy: Secondary | ICD-10-CM | POA: Diagnosis not present

## 2021-12-06 DIAGNOSIS — N390 Urinary tract infection, site not specified: Secondary | ICD-10-CM | POA: Diagnosis not present

## 2021-12-06 DIAGNOSIS — I739 Peripheral vascular disease, unspecified: Secondary | ICD-10-CM | POA: Diagnosis not present

## 2021-12-06 DIAGNOSIS — I4819 Other persistent atrial fibrillation: Secondary | ICD-10-CM | POA: Diagnosis not present

## 2021-12-06 DIAGNOSIS — B964 Proteus (mirabilis) (morganii) as the cause of diseases classified elsewhere: Secondary | ICD-10-CM | POA: Diagnosis not present

## 2021-12-06 DIAGNOSIS — I130 Hypertensive heart and chronic kidney disease with heart failure and stage 1 through stage 4 chronic kidney disease, or unspecified chronic kidney disease: Secondary | ICD-10-CM | POA: Diagnosis not present

## 2021-12-07 DIAGNOSIS — I129 Hypertensive chronic kidney disease with stage 1 through stage 4 chronic kidney disease, or unspecified chronic kidney disease: Secondary | ICD-10-CM | POA: Diagnosis not present

## 2021-12-07 DIAGNOSIS — I4891 Unspecified atrial fibrillation: Secondary | ICD-10-CM | POA: Diagnosis not present

## 2021-12-07 DIAGNOSIS — E039 Hypothyroidism, unspecified: Secondary | ICD-10-CM | POA: Diagnosis not present

## 2021-12-17 ENCOUNTER — Ambulatory Visit (INDEPENDENT_AMBULATORY_CARE_PROVIDER_SITE_OTHER): Payer: Medicare HMO

## 2021-12-17 DIAGNOSIS — Z0000 Encounter for general adult medical examination without abnormal findings: Secondary | ICD-10-CM | POA: Diagnosis not present

## 2021-12-17 NOTE — Progress Notes (Signed)
Virtual Visit via Telephone Note  I connected with  Yvonne Bailey on 12/17/21 at  4:00 PM EDT by telephone and verified that I am speaking with the correct person using two identifiers.  Location: Patient: Home Provider: [place] Persons participating in the virtual visit: [place]   I discussed the limitations, risks, security and privacy concerns of performing an evaluation and management service by telephone and the availability of in person appointments. The patient expressed understanding and agreed to proceed.  Interactive audio and video telecommunications were attempted between this nurse and patient, however failed, due to patient having technical difficulties OR patient did not have access to video capability.  We continued and completed visit with audio only.  Some vital signs may be absent or patient reported.   Sheral Flow, LPN  Subjective:   Yvonne Bailey is a 81 y.o. female who presents for Medicare Annual (Subsequent) preventive examination.  Review of Systems     Cardiac Risk Factors include: advanced age (>38mn, >>44women);hypertension;dyslipidemia;sedentary lifestyle     Objective:    There were no vitals filed for this visit. There is no height or weight on file to calculate BMI.     12/17/2021    4:37 PM 11/18/2021    5:00 PM 09/02/2021   11:48 PM 11/02/2020    7:12 AM 10/30/2020    7:03 PM 10/29/2020   11:29 PM 04/23/2018    9:20 AM  Advanced Directives  Does Patient Have a Medical Advance Directive? Yes No No Yes No No No  Type of AParamed[place]of [place] will   [place] Chart? No - copy requested        Would patient like information on creating a medical advance directive?  No - Patient declined No - Patient declined  No - Patient declined No - Patient declined No - Patient declined    Current Medications  (verified) Outpatient Encounter Medications as of 12/17/2021  Medication Sig   aspirin EC 81 MG tablet Take 1 tablet (81 mg total) by mouth daily.   atorvastatin (LIPITOR) 80 MG tablet TAKE 1 TABLET(80 MG) BY MOUTH DAILY (Patient taking differently: Take 80 mg by mouth daily.)   benazepril (LOTENSIN) 20 MG tablet TAKE 1 AND 1/2 TABLETS(30 MG) BY MOUTH DAILY (Patient taking differently: Take 30 mg by mouth daily.)   ELIQUIS 5 MG TABS tablet TAKE 1 TABLET(5 MG) BY MOUTH TWICE DAILY (Patient taking differently: Take 5 mg by mouth 2 (two) times daily.)   estradiol (ESTRACE) 0.1 MG/GM vaginal cream Place 1 Applicatorful vaginally once a week.   levothyroxine (SYNTHROID) 88 MCG tablet TAKE 1 TABLET(88 MCG) BY MOUTH DAILY (Patient taking differently: Take 88 mcg by mouth daily before breakfast.)   MYRBETRIQ 25 MG TB24 tablet TAKE 1 TABLET(25 MG) BY MOUTH DAILY (Patient taking differently: Take 25 mg by mouth daily.)   nitroGLYCERIN (NITROSTAT) 0.4 MG SL tablet Place 1 tablet (0.4 mg total) under the tongue every 5 (five) minutes as needed for chest pain.   polyethylene glycol (MIRALAX) 17 g packet Take one dose 3 times a day until bowel clear, maximum of 3 consecutive days. (Patient taking differently: Take 17 g by mouth daily as needed for mild constipation.)   sertraline (ZOLOFT) 100 MG tablet Take 1 tablet (100 mg total) by mouth daily.   spironolactone (ALDACTONE) 25 MG tablet TAKE 1 TABLET(25 MG) BY  MOUTH DAILY (Patient taking differently: Take 25 mg by mouth daily.)   No facility-administered encounter medications on file as of 12/17/2021.    Allergies (verified) Definity [perflutren lipid microsphere] and Pletal [cilostazol]   History: Past Medical History:  Diagnosis Date   Bilateral leg edema 07/19/2015   CAD (coronary artery disease)    a. anterior MI s/p PCI in 1992. b. cath 08/2015 with severe three-vessel CAD turned down for CABG and underwent DESx5 to prox Cx/OM2/D1/oRCA/mRCA   Carotid  artery disease ([place])    a. carotid duplex 03/2015 showed 1-39% BICA, normal subclavian arteries, chronically occluded left vertebral, f/u recommended only PRN.   DVT (deep venous thrombosis) ([place])    X1   History of nuclear stress test    a. Myoview 6/17: EF 20-25%, mid anteroseptal, apical anterior, apical septal, apical inferior, apical lateral and apical scar, no ischemia, intermediate risk   HTN (hypertension)    Hyperlipidemia    Hypokalemia    Hypothyroidism    Ischemic cardiomyopathy    a. Echo 6/17: EF 20-25%, apex appears akinetic, MAC, moderate MR, moderate LAE, mild RVE, trivial PI, PASP 47 mmHg (needs repeat with Definity contrast).  b. Limited echo with Definity contrast 7/17: EF 25-30%, moderate to severe LAE. c. Limited Echo 2018 showed EF 40-45%.   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 02/2017 in setting of noncompliance with Eliquis   TIA (transient ischemic attack)    Tricuspid regurgitation    Past Surgical History:  Procedure Laterality Date   APPENDECTOMY     at hysterectomy and USO for fibroids, Dr. Ysidro Evert   CARDIAC CATHETERIZATION  1992   Dr Eustace Quail   CARDIAC CATHETERIZATION N/A 09/06/2015   Procedure: Right/Left Heart Cath and Coronary Angiography;  Surgeon: Burnell Blanks, MD;  Location: [facility];  Service: Cardiovascular;  Laterality: N/A;   CARDIAC CATHETERIZATION N/A 09/07/2015   Procedure: Coronary Stent Intervention;  Surgeon: Burnell Blanks, MD;  Location: [facility];  Service: Cardiovascular;  Laterality: N/A;   COLONOSCOPY     negative; 2008, Dr. Delfin Edis   fracture  LLE     '94; pinned   IR ANGIO INTRA EXTRACRAN SEL COM CAROTID INNOMINATE BILAT MOD SED  03/02/2017   IR ANGIO INTRA EXTRACRAN SEL COM CAROTID INNOMINATE BILAT MOD SED  04/23/2018   IR ANGIO VERTEBRAL SEL SUBCLAVIAN INNOMINATE BILAT MOD SED  04/23/2018   IR ANGIO VERTEBRAL SEL VERTEBRAL UNI R MOD SED  03/02/2017   IR RADIOLOGIST EVAL & MGMT  04/28/2017   IR US GUIDE VASC ACCESS RIGHT  04/23/2018   RADIOLOGY WITH ANESTHESIA N/A 03/02/2017   Procedure: RADIOLOGY WITH ANESTHESIA;  Surgeon: Luanne Bras, MD;  Location: [facility];  Service: Radiology;  Laterality: N/A;   TEE WITHOUT CARDIOVERSION N/A 11/02/2020   Procedure: TRANSESOPHAGEAL ECHOCARDIOGRAM (TEE);  Surgeon: Sanda Klein, MD;  Location: [facility];  Service: Cardiovascular;  Laterality: N/A;   TONSILLECTOMY     TOTAL ABDOMINAL HYSTERECTOMY     & BSO for fibroids   Family History  Problem Relation Age of Onset  Diabetes Mother    Hypertension Mother    Stroke Brother        ?> 68   Coronary artery disease Brother        stent in 68s   Cancer Neg Hx    Social History   Socioeconomic History   Marital status: Married    Spouse name: [name]   Number of children: 1   Years of education: college   Highest education level: Not on file  Occupational History   Occupation: Pharmacist, hospital    Comment: Retired  Tobacco Use   Smoking status: Former    Types: Cigarettes    Quit date: 03/11/1990    Years since quitting: 31.7   Smokeless tobacco: Never   Tobacco comments:    smoked 1973- 1992, up to < 5 cigarettes  Vaping Use   Vaping Use: Never used  Substance and Sexual Activity   Alcohol use: No   Drug use: No   Sexual activity: Not Currently    Birth control/protection: Surgical  Other Topics Concern   Not on file  Social History Narrative   She works as needed Oceanographer, 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 08/31/09- designated party form signed appointing, husband Grenda Lora; ok to leave msg on home phone  [phone].      Caffeine Small Amount one cup very rare.   Right handed.   One child- [name]   Social Determinants of Health   Financial Resource Strain: Low Risk  (12/17/2021)   Overall Financial Resource Strain (CARDIA)    Difficulty of Paying Living Expenses: Not hard at all  Food Insecurity: No Food Insecurity (12/17/2021)   Hunger Vital Sign    Worried About Running Out of Food in the Last Year: Never true    Ran Out of Food in the Last Year: Never true  Transportation Needs: No Transportation Needs (12/17/2021)   PRAPARE - Hydrologist (Medical): No    Lack of Transportation (Non-Medical): No  Physical Activity: Inactive (12/17/2021)   Exercise Vital Sign    Days of Exercise per Week: 0 days    Minutes of Exercise per Session: 0 min  Stress: No Stress Concern Present (12/17/2021)   [place]    Feeling of Stress : Not at all  Social Connections: Unknown (12/17/2021)   Social Connection and Isolation Panel [NHANES]    Frequency of Communication with Friends and Family: Patient refused    Frequency of Social Gatherings with Friends and Family: Patient refused    Attends Religious Services: Patient refused    Marine scientist or Organizations: Patient refused    Attends Archivist Meetings: Patient refused    Marital Status: Patient refused    Tobacco Counseling Counseling given: Not Answered Tobacco comments: smoked [place], up to < 5 cigarettes   Clinical Intake:  Pre-visit preparation completed: Yes  Pain : No/denies pain     Nutritional Risks: None Diabetes: No  How often do you need to have someone help you when you read instructions, pamphlets, or other written materials from your doctor or pharmacy?: 1 - Never What is the last grade level you completed in school?: HSG; [employer]REE  Diabetic? no  Interpreter Needed?:  No  Information entered by :: Lisette Abu, LPN.   Activities of Daily Living    12/17/2021    4:18 PM 11/16/2021   10:00 PM  In your present state  of health, do you have any difficulty performing the following activities:  Hearing? 1 1  Vision? 1 0  Difficulty concentrating or making decisions? 1 1  Walking or climbing stairs? 1 1  Comment WALKER   Dressing or bathing? 1 1  Doing errands, shopping? 1 0  Preparing Food and eating ? Y   Using the Toilet? Y   In the past six months, have you accidently leaked urine? Y   Do you have problems with loss of bowel control? Y   Managing your Medications? Y   Managing your Finances? Y   Housekeeping or managing your Housekeeping? Y     Patient Care Team: Binnie Rail, MD as PCP - General (Internal Medicine) Burnell Blanks, MD as PCP - Cardiology (Cardiology) Burnell Blanks, MD as Consulting Physician (Cardiology) Charlton Haws, [facility] as Pharmacist (Pharmacist) Monna Fam, MD as Consulting Physician (Ophthalmology)  Indicate any recent Medical Services you may have received from other than [facility] providers in the past year (date may be approximate).     Assessment:   This is a routine wellness examination for [place].  Hearing/Vision screen Hearing Screening - Comments:: Denies hearing difficulties.   Vision Screening - Comments:: Wears rx glasses - not up to date with routine eye exams with [facility]   Dietary issues and exercise activities discussed: Current Exercise Habits: The patient does not participate in regular exercise at present, Exercise limited by: psychological condition(s);cardiac condition(s);respiratory conditions(s)   Goals Addressed             This Visit's Progress    No goals at this time.        Depression Screen    12/17/2021    4:29 PM 12/17/2021    4:24 PM 12/23/2019   10:24 AM 08/20/2019    2:04 PM 05/20/2018    3:41 PM 04/11/2017   11:26 AM  02/04/2017    4:21 PM  PHQ 2/9 Scores  PHQ - 2 Score 4 4 0 0 1 0 0  PHQ- 9 Score 13    4      Fall Risk    12/17/2021    4:17 PM 04/10/2021   10:22 AM 03/28/2020    2:49 PM 02/09/2019    4:00 PM 03/16/2018    3:04 PM  [place] in the past year? 1 1 0 1 0  Number falls in past yr: 1 1 0 1   Injury with Fall? 0 0 0    Risk for fall due to : Impaired vision;Mental status change;Impaired balance/gait;History of fall(s) No Fall Risks No Fall Risks    Follow up Falls prevention discussed Falls evaluation completed Falls evaluation completed      [place]:  Any stairs in or around the home? No  If so, are there any without handrails? No  Home free of loose throw rugs in walkways, pet beds, electrical cords, etc? Yes  Adequate lighting in your home to reduce risk of falls? Yes   ASSISTIVE DEVICES UTILIZED TO PREVENT FALLS:  Life alert? Yes  ([place]) Use of a cane, walker or w/c? Yes  Grab bars in the bathroom? No  Shower chair or bench in shower? Yes  Elevated toilet seat or a handicapped toilet? Yes   TIMED UP AND GO:  Was the test performed? No . Phone Visit   Cognitive Function:    12/17/2021    4:40 PM 11/21/2016  3:37 PM  MMSE - Mini Mental State Exam  Not completed: Refused   Orientation to time  5  Orientation to Place  5  Registration  3  Attention/ Calculation  5  Recall  2  Language- name 2 objects  2  Language- repeat  1  Language- follow 3 step command  3  Language- read & follow direction  1  Write a sentence  1  Copy design  1  Total score  29        12/17/2021    4:40 PM  6CIT Screen  What Year? 4 points  What month? 3 points  What time? 3 points  Count back from 20 4 points  Months in reverse 4 points  Repeat phrase 10 points  Total Score 28 points    Immunizations Immunization History  Administered Date(s) Administered   Moderna SARS-COV2 Booster Vaccination 08/26/2019   Moderna Sars-Covid-2  Vaccination 07/28/2019   PPD Test 03/28/2020   Pneumococcal Conjugate-13 12/15/2014   Pneumococcal Polysaccharide-23 08/31/2009    TDAP status: Due, Education has been provided regarding the importance of this vaccine. Advised may receive this vaccine at local pharmacy or Health Dept. Aware to provide a copy of the vaccination record if obtained from local pharmacy or Health Dept. Verbalized acceptance and understanding.  Flu Vaccine status: Due, Education has been provided regarding the importance of this vaccine. Advised may receive this vaccine at local pharmacy or Health Dept. Aware to provide a copy of the vaccination record if obtained from local pharmacy or Health Dept. Verbalized acceptance and understanding.  Pneumococcal vaccine status: Up to date  Covid-19 vaccine status: Completed vaccines  Qualifies for Shingles Vaccine? Yes   Zostavax completed No   Shingrix Completed?: No.    Education has been provided regarding the importance of this vaccine. Patient has been advised to call insurance company to determine out of pocket expense if they have not yet received this vaccine. Advised may also receive vaccine at local pharmacy or Health Dept. Verbalized acceptance and understanding.  Screening Tests Health Maintenance  Topic Date Due   Zoster Vaccines- Shingrix (1 of 2) Never done   COVID-19 Vaccine (2 - Moderna series) 10/21/2019   INFLUENZA VACCINE  Never done   TETANUS/TDAP  04/10/2022 (Originally 07/11/1959)   DEXA SCAN  04/10/2024 (Originally 07/10/2005)   Pneumonia Vaccine 70+ Years old  Completed   HPV VACCINES  Aged Out    Health Maintenance  Health Maintenance Due  Topic Date Due   Zoster Vaccines- Shingrix (1 of 2) Never done   COVID-19 Vaccine (2 - Moderna series) 10/21/2019   INFLUENZA VACCINE  Never done    Colorectal cancer screening: No longer required.   Mammogram status: No longer required due to age/refusal.  Bone Density status: never done  Lung  Cancer Screening: (Low Dose CT Chest recommended if Age 69-80 years, 30 pack-year currently smoking OR have quit w/in 15years.) does not qualify.   Lung Cancer Screening Referral: no  Additional Screening:  Hepatitis C Screening: does not qualify; Completed no  Vision Screening: Recommended annual ophthalmology exams for early detection of glaucoma and other disorders of the eye. Is the patient up to date with their annual eye exam?  No  Who is the provider or what is the name of the office in which the patient attends annual eye exams? [facility] If pt is not established with a provider, would they like to be referred to a provider to establish care? No .  Dental Screening: Recommended annual dental exams for proper oral hygiene  Community Resource Referral / Chronic Care Management: CRR required this visit?  No   CCM required this visit?  No      Plan:     I have personally reviewed and noted the following in the patient's chart:   Medical and social history Use of alcohol, tobacco or illicit drugs  Current medications and supplements including opioid prescriptions. Patient is not currently taking opioid prescriptions. Functional ability and status Nutritional status Physical activity Advanced directives List of other physicians Hospitalizations, surgeries, and ER visits in previous 12 months Vitals Screenings to include cognitive, depression, and falls Referrals and appointments  In addition, I have reviewed and discussed with patient certain preventive protocols, quality metrics, and best practice recommendations. A written personalized care plan for preventive services as well as general preventive health recommendations were provided to patient.     Sheral Flow, LPN   [id number]   Nurse Notes: Spoke with son (per DPR) to complete AWV.

## 2021-12-17 NOTE — Patient Instructions (Signed)
Yvonne Bailey , Thank you for taking time to come for your Medicare Wellness Visit. I appreciate your ongoing commitment to your health goals. Please review the following plan we discussed and let me know if I can assist you in the future.   These are the goals we discussed:  Goals      No goals at this time.        This is a list of the screening recommended for you and due dates:  Health Maintenance  Topic Date Due   Zoster (Shingles) Vaccine (1 of 2) Never done   COVID-19 Vaccine (2 - Moderna series) 10/21/2019   Flu Shot  Never done   Tetanus Vaccine  04/10/2022*   DEXA scan (bone density measurement)  04/10/2024*   Pneumonia Vaccine  Completed   HPV Vaccine  Aged Out  *Topic was postponed. The date shown is not the original due date.    Advanced directives: Yes  Conditions/risks identified: Yes; Falls  Next appointment: Follow up in one year for your annual wellness visit.   Preventive Care 81 Years and Older, Female Preventive care refers to lifestyle choices and visits with your health care provider that can promote health and wellness. What does preventive care include? A yearly physical exam. This is also called an annual well check. Dental exams once or twice a year. Routine eye exams. Ask your health care provider how often you should have your eyes checked. Personal lifestyle choices, including: Daily care of your teeth and gums. Regular physical activity. Eating a healthy diet. Avoiding tobacco and drug use. Limiting alcohol use. Practicing safe sex. Taking low-dose aspirin every day. Taking vitamin and mineral supplements as recommended by your health care provider. What happens during an annual well check? The services and screenings done by your health care provider during your annual well check will depend on your age, overall health, lifestyle risk factors, and family history of disease. Counseling  Your health care provider may ask you questions about  your: Alcohol use. Tobacco use. Drug use. Emotional well-being. Home and relationship well-being. Sexual activity. Eating habits. History of falls. Memory and ability to understand (cognition). Work and work Statistician. Reproductive health. Screening  You may have the following tests or measurements: Height, weight, and BMI. Blood pressure. Lipid and cholesterol levels. These may be checked every 5 years, or more frequently if you are over 62 years old. Skin check. Lung cancer screening. You may have this screening every year starting at age 55 if you have a 30-pack-year history of smoking and currently smoke or have quit within the past 15 years. Fecal occult blood test (FOBT) of the stool. You may have this test every year starting at age 60. Flexible sigmoidoscopy or colonoscopy. You may have a sigmoidoscopy every 5 years or a colonoscopy every 10 years starting at age 30. Hepatitis C blood test. Hepatitis B blood test. Sexually transmitted disease (STD) testing. Diabetes screening. This is done by checking your blood sugar (glucose) after you have not eaten for a while (fasting). You may have this done every 1-3 years. Bone density scan. This is done to screen for osteoporosis. You may have this done starting at age 35. Mammogram. This may be done every 1-2 years. Talk to your health care provider about how often you should have regular mammograms. Talk with your health care provider about your test results, treatment options, and if necessary, the need for more tests. Vaccines  Your health care provider may recommend certain  vaccines, such as: Influenza vaccine. This is recommended every year. Tetanus, diphtheria, and acellular pertussis (Tdap, Td) vaccine. You may need a Td booster every 10 years. Zoster vaccine. You may need this after age 81. Pneumococcal 13-valent conjugate (PCV13) vaccine. One dose is recommended after age 81. Pneumococcal polysaccharide (PPSV23) vaccine.  One dose is recommended after age 81. Talk to your health care provider about which screenings and vaccines you need and how often you need them. This information is not intended to replace advice given to you by your health care provider. Make sure you discuss any questions you have with your health care provider. Document Released: 03/24/2015 Document Revised: 11/15/2015 Document Reviewed: 12/27/2014 Elsevier Interactive Patient Education  2017 [employer] Prevention in the Home Falls can cause injuries. They can happen to people of all ages. There are many things you can do to make your home safe and to help prevent falls. What can I do on the outside of my home? Regularly fix the edges of walkways and driveways and fix any cracks. Remove anything that might make you trip as you walk through a door, such as a raised step or threshold. Trim any bushes or trees on the path to your home. Use bright outdoor lighting. Clear any walking paths of anything that might make someone trip, such as rocks or tools. Regularly check to see if handrails are loose or broken. Make sure that both sides of any steps have handrails. Any raised decks and porches should have guardrails on the edges. Have any leaves, snow, or ice cleared regularly. Use sand or salt on walking paths during winter. Clean up any spills in your garage right away. This includes oil or grease spills. What can I do in the bathroom? Use night lights. Install grab bars by the toilet and in the tub and shower. Do not use towel bars as grab bars. Use non-skid mats or decals in the tub or shower. If you need to sit down in the shower, use a plastic, non-slip stool. Keep the floor dry. Clean up any water that spills on the floor as soon as it happens. Remove soap buildup in the tub or shower regularly. Attach bath mats securely with double-sided non-slip rug tape. Do not have throw rugs and other things on the floor that can make  you trip. What can I do in the bedroom? Use night lights. Make sure that you have a light by your bed that is easy to reach. Do not use any sheets or blankets that are too big for your bed. They should not hang down onto the floor. Have a firm chair that has side arms. You can use this for support while you get dressed. Do not have throw rugs and other things on the floor that can make you trip. What can I do in the kitchen? Clean up any spills right away. Avoid walking on wet floors. Keep items that you use a lot in easy-to-reach places. If you need to reach something above you, use a strong step stool that has a grab bar. Keep electrical cords out of the way. Do not use floor polish or wax that makes floors slippery. If you must use wax, use non-skid floor wax. Do not have throw rugs and other things on the floor that can make you trip. What can I do with my stairs? Do not leave any items on the stairs. Make sure that there are handrails on both sides of the stairs  and use them. Fix handrails that are broken or loose. Make sure that handrails are as long as the stairways. Check any carpeting to make sure that it is firmly attached to the stairs. Fix any carpet that is loose or worn. Avoid having throw rugs at the top or bottom of the stairs. If you do have throw rugs, attach them to the floor with carpet tape. Make sure that you have a light switch at the top of the stairs and the bottom of the stairs. If you do not have them, ask someone to add them for you. What else can I do to help prevent falls? Wear shoes that: Do not have high heels. Have rubber bottoms. Are comfortable and fit you well. Are closed at the toe. Do not wear sandals. If you use a stepladder: Make sure that it is fully opened. Do not climb a closed stepladder. Make sure that both sides of the stepladder are locked into place. Ask someone to hold it for you, if possible. Clearly mark and make sure that you can  see: Any grab bars or handrails. First and last steps. Where the edge of each step is. Use tools that help you move around (mobility aids) if they are needed. These include: Canes. Walkers. Scooters. Crutches. Turn on the lights when you go into a dark area. Replace any light bulbs as soon as they burn out. Set up your furniture so you have a clear path. Avoid moving your furniture around. If any of your floors are uneven, fix them. If there are any pets around you, be aware of where they are. Review your medicines with your doctor. Some medicines can make you feel dizzy. This can increase your chance of falling. Ask your doctor what other things that you can do to help prevent falls. This information is not intended to replace advice given to you by your health care provider. Make sure you discuss any questions you have with your health care provider. Document Released: 12/22/2008 Document Revised: 08/03/2015 Document Reviewed: 04/01/2014 Elsevier Interactive Patient Education  2017 [employer].

## 2021-12-18 ENCOUNTER — Inpatient Hospital Stay: Payer: Medicare HMO | Admitting: Internal Medicine

## 2021-12-18 NOTE — Progress Notes (Signed)
Medical screening examination/treatment/procedure(s) were performed by non-physician practitioner and as supervising physician I was immediately available for consultation/collaboration.  I agree with above. Domnick Chervenak J Kamyra Schroeck, MD  

## 2021-12-21 ENCOUNTER — Telehealth: Payer: Self-pay | Admitting: Internal Medicine

## 2021-12-21 NOTE — Telephone Encounter (Signed)
Melissa from [facility] home [facility] called for verbal orders to move the pt's OT evaluation to next week, pt has been at her adult care center most days and has not been home for the eval.   I let Melissa know that Dr. Quay Burow was out of office today.  Please call Melissa to confirm orders when available.   Melissa: [phone]

## 2021-12-24 ENCOUNTER — Encounter: Payer: Self-pay | Admitting: Internal Medicine

## 2021-12-24 DIAGNOSIS — F03A3 Unspecified dementia, mild, with mood disturbance: Secondary | ICD-10-CM | POA: Diagnosis not present

## 2021-12-24 DIAGNOSIS — I130 Hypertensive heart and chronic kidney disease with heart failure and stage 1 through stage 4 chronic kidney disease, or unspecified chronic kidney disease: Secondary | ICD-10-CM | POA: Diagnosis not present

## 2021-12-24 DIAGNOSIS — F331 Major depressive disorder, recurrent, moderate: Secondary | ICD-10-CM | POA: Diagnosis not present

## 2021-12-24 DIAGNOSIS — Z9049 Acquired absence of other specified parts of digestive tract: Secondary | ICD-10-CM

## 2021-12-24 DIAGNOSIS — Z955 Presence of coronary angioplasty implant and graft: Secondary | ICD-10-CM

## 2021-12-24 DIAGNOSIS — Z556 Problems related to health literacy: Secondary | ICD-10-CM

## 2021-12-24 DIAGNOSIS — Z9181 History of falling: Secondary | ICD-10-CM

## 2021-12-24 DIAGNOSIS — I429 Cardiomyopathy, unspecified: Secondary | ICD-10-CM | POA: Diagnosis not present

## 2021-12-24 DIAGNOSIS — Z87891 Personal history of nicotine dependence: Secondary | ICD-10-CM

## 2021-12-24 DIAGNOSIS — I2510 Atherosclerotic heart disease of native coronary artery without angina pectoris: Secondary | ICD-10-CM | POA: Diagnosis not present

## 2021-12-24 DIAGNOSIS — Z960 Presence of urogenital implants: Secondary | ICD-10-CM

## 2021-12-24 DIAGNOSIS — Z7982 Long term (current) use of aspirin: Secondary | ICD-10-CM

## 2021-12-24 DIAGNOSIS — Z87440 Personal history of urinary (tract) infections: Secondary | ICD-10-CM

## 2021-12-24 DIAGNOSIS — R7303 Prediabetes: Secondary | ICD-10-CM

## 2021-12-24 DIAGNOSIS — N1831 Chronic kidney disease, stage 3a: Secondary | ICD-10-CM | POA: Diagnosis not present

## 2021-12-24 DIAGNOSIS — Z7901 Long term (current) use of anticoagulants: Secondary | ICD-10-CM

## 2021-12-24 DIAGNOSIS — Z8673 Personal history of transient ischemic attack (TIA), and cerebral infarction without residual deficits: Secondary | ICD-10-CM

## 2021-12-24 DIAGNOSIS — Z86718 Personal history of other venous thrombosis and embolism: Secondary | ICD-10-CM

## 2021-12-24 DIAGNOSIS — E039 Hypothyroidism, unspecified: Secondary | ICD-10-CM

## 2021-12-24 DIAGNOSIS — I739 Peripheral vascular disease, unspecified: Secondary | ICD-10-CM

## 2021-12-24 DIAGNOSIS — I509 Heart failure, unspecified: Secondary | ICD-10-CM | POA: Diagnosis not present

## 2021-12-24 DIAGNOSIS — G9341 Metabolic encephalopathy: Secondary | ICD-10-CM

## 2021-12-24 DIAGNOSIS — I4891 Unspecified atrial fibrillation: Secondary | ICD-10-CM | POA: Diagnosis not present

## 2021-12-24 DIAGNOSIS — E785 Hyperlipidemia, unspecified: Secondary | ICD-10-CM | POA: Diagnosis not present

## 2021-12-24 NOTE — Progress Notes (Unsigned)
Subjective:    Patient ID: Yvonne Bailey, female    DOB: [birth year], 81 y.o.   MRN: [id number]     HPI Yvonne Bailey is here for follow up from rehab.  She is here with her son who helps provide the history secondary to her dementia and being a poor historian   Hospitalized 11/16/21 - 11/20/21 for acute metabolic encephalopathy.  Discharged to SNF.  Presented with AMS.  Metabolic encephalopathy due to UTI - treated with rocephin.  Had hypokalemia and hypomagnesemia- repleted.  Now at home with home OT, PT, nurse   ? UTI - she has some confused moments, has intermittent weakness and her son is concerned she may have a UTI.  She sometimes refuses to take her pills.  She has had more symptoms that are consistent with her prior urinary tract infections.  Urine is dark.  She has not had any fevers, abdominal pain, nausea or back pain.  She typically uses depends and does not go to the bathroom as much as she should despite being able to.  Medications and allergies reviewed with patient and updated if appropriate.  Current Outpatient Medications on File Prior to Visit  Medication Sig Dispense Refill   aspirin EC 81 MG tablet Take 1 tablet (81 mg total) by mouth daily. 90 tablet 3   atorvastatin (LIPITOR) 80 MG tablet TAKE 1 TABLET(80 MG) BY MOUTH DAILY (Patient taking differently: Take 80 mg by mouth daily.) 90 tablet 1   benazepril (LOTENSIN) 20 MG tablet TAKE 1 AND 1/2 TABLETS(30 MG) BY MOUTH DAILY (Patient taking differently: Take 30 mg by mouth daily.) 135 tablet 1   ELIQUIS 5 MG TABS tablet TAKE 1 TABLET(5 MG) BY MOUTH TWICE DAILY (Patient taking differently: Take 5 mg by mouth 2 (two) times daily.) 180 tablet 0   estradiol (ESTRACE) 0.1 MG/GM vaginal cream Place 1 Applicatorful vaginally once a week.     levothyroxine (SYNTHROID) 88 MCG tablet TAKE 1 TABLET(88 MCG) BY MOUTH DAILY (Patient taking differently: Take 88 mcg by mouth daily before breakfast.) 90 tablet 1   MYRBETRIQ 25  MG TB24 tablet TAKE 1 TABLET(25 MG) BY MOUTH DAILY (Patient taking differently: Take 25 mg by mouth daily.) 30 tablet 5   nitroGLYCERIN (NITROSTAT) 0.4 MG SL tablet Place 1 tablet (0.4 mg total) under the tongue every 5 (five) minutes as needed for chest pain. 25 tablet 1   polyethylene glycol (MIRALAX) 17 g packet Take one dose 3 times a day until bowel clear, maximum of 3 consecutive days. (Patient taking differently: Take 17 g by mouth daily as needed for mild constipation.) 9 each 0   sertraline (ZOLOFT) 100 MG tablet Take 1 tablet (100 mg total) by mouth daily. 90 tablet 3   spironolactone (ALDACTONE) 25 MG tablet TAKE 1 TABLET(25 MG) BY MOUTH DAILY (Patient taking differently: Take 25 mg by mouth daily.) 90 tablet 1   No current facility-administered medications on file prior to visit.     Review of Systems  Constitutional:  Negative for fever.  Respiratory:  Negative for shortness of breath.   Cardiovascular:  Negative for chest pain and palpitations.  Gastrointestinal:  Negative for abdominal pain and nausea.  Genitourinary:  Negative for dysuria, frequency and hematuria.       Urine is dark  Musculoskeletal:  Negative for back pain.  Neurological:  Negative for headaches.  Psychiatric/Behavioral:         Intermittent confusion  Objective:   Vitals:   12/25/21 1548  BP: 136/88  Pulse: 64  Temp: 98.1 F (36.7 C)  SpO2: 95%   BP Readings from Last 3 Encounters:  12/25/21 136/88  11/20/21 (!) 143/68  11/06/21 (!) 142/82   Wt Readings from Last 3 Encounters:  12/25/21 160 lb (72.6 kg)  11/16/21 160 lb 11.5 oz (72.9 kg)  11/06/21 171 lb (77.6 kg)   Body mass index is 25.82 kg/m.    Physical Exam Constitutional:      General: She is not in acute distress.    Appearance: Normal appearance. She is not ill-appearing.  HENT:     Head: Normocephalic.  Eyes:     Conjunctiva/sclera: Conjunctivae normal.  Cardiovascular:     Rate and Rhythm: Normal rate and  regular rhythm.  Pulmonary:     Effort: Pulmonary effort is normal. No respiratory distress.     Breath sounds: No wheezing or rales.  Abdominal:     General: There is no distension.     Palpations: Abdomen is soft.     Tenderness: There is no abdominal tenderness. There is no right CVA tenderness, left CVA tenderness, guarding or rebound.  Musculoskeletal:     Right lower leg: No edema.     Left lower leg: No edema.  Skin:    General: Skin is warm and dry.  Neurological:     Mental Status: She is alert.        Lab Results  Component Value Date   WBC 7.8 11/19/2021   HGB 11.9 (L) 11/19/2021   HCT 37.4 11/19/2021   PLT 284 11/19/2021   GLUCOSE 81 11/19/2021   CHOL 108 11/06/2021   TRIG 61.0 11/06/2021   HDL 44.00 11/06/2021   LDLCALC 52 11/06/2021   ALT 15 11/17/2021   AST 27 11/17/2021   NA 141 11/19/2021   K 3.9 11/19/2021   CL 111 11/19/2021   CREATININE 1.00 11/19/2021   BUN 15 11/19/2021   CO2 23 11/19/2021   TSH 11.897 (H) 11/16/2021   INR 1.8 (H) 09/02/2021   HGBA1C 5.3 11/06/2021     Assessment & Plan:    See Problem List for Assessment and Plan of chronic medical problems.

## 2021-12-25 ENCOUNTER — Ambulatory Visit (INDEPENDENT_AMBULATORY_CARE_PROVIDER_SITE_OTHER): Payer: Medicare HMO | Admitting: Internal Medicine

## 2021-12-25 VITALS — BP 136/88 | HR 64 | Temp 98.1°F | Ht 66.0 in | Wt 160.0 lb

## 2021-12-25 DIAGNOSIS — N3001 Acute cystitis with hematuria: Secondary | ICD-10-CM | POA: Diagnosis not present

## 2021-12-25 DIAGNOSIS — I10 Essential (primary) hypertension: Secondary | ICD-10-CM | POA: Diagnosis not present

## 2021-12-25 DIAGNOSIS — R300 Dysuria: Secondary | ICD-10-CM

## 2021-12-25 LAB — POC URINALSYSI DIPSTICK (AUTOMATED)
Bilirubin, UA: NEGATIVE
Glucose, UA: NEGATIVE
Ketones, UA: NEGATIVE
Nitrite, UA: NEGATIVE
Protein, UA: POSITIVE — AB
Spec Grav, UA: 1.01 (ref 1.010–1.025)
Urobilinogen, UA: 0.2 E.U./dL
pH, UA: 8 (ref 5.0–8.0)

## 2021-12-25 MED ORDER — CEFTRIAXONE SODIUM 1 G IJ SOLR
1.0000 g | Freq: Once | INTRAMUSCULAR | Status: AC
  Administered 2021-12-25: 1 g via INTRAMUSCULAR

## 2021-12-25 MED ORDER — CIPROFLOXACIN HCL 250 MG PO TABS: 250.0000 mg | ORAL_TABLET | Freq: Two times a day (BID) | ORAL | 0 refills | Status: AC

## 2021-12-25 NOTE — Assessment & Plan Note (Signed)
Chronic Blood pressure well controlled Continue benazepril 30 mg daily, spironolactone 25 mg daily-encouraged compliance with medications

## 2021-12-25 NOTE — Patient Instructions (Addendum)
You received an injection of an antibiotic here today.   Take the antibiotic as prescribed.  Take tylenol if needed.     Increase your water intake.   Call if no improvement     Urinary Tract Infection, Adult A urinary tract infection (UTI) is an infection of any part of the urinary tract, which includes the kidneys, ureters, bladder, and urethra. These organs make, store, and get rid of urine in the body. UTI can be a bladder infection (cystitis) or kidney infection (pyelonephritis). What are the causes? This infection may be caused by fungi, viruses, or bacteria. Bacteria are the most common cause of UTIs. This condition can also be caused by repeated incomplete emptying of the bladder during urination. What increases the risk? This condition is more likely to develop if: You ignore your need to urinate or hold urine for long periods of time. You do not empty your bladder completely during urination. You wipe back to front after urinating or having a bowel movement, if you are female. You are uncircumcised, if you are female. You are constipated. You have a urinary catheter that stays in place (indwelling). You have a weak defense (immune) system. You have a medical condition that affects your bowels, kidneys, or bladder. You have diabetes. You take antibiotic medicines frequently or for long periods of time, and the antibiotics no longer work well against certain types of infections (antibiotic resistance). You take medicines that irritate your urinary tract. You are exposed to chemicals that irritate your urinary tract. You are female.  What are the signs or symptoms? Symptoms of this condition include: Fever. Frequent urination or passing small amounts of urine frequently. Needing to urinate urgently. Pain or burning with urination. Urine that smells bad or unusual. Cloudy urine. Pain in the lower abdomen or back. Trouble urinating. Blood in the urine. Vomiting or  being less hungry than normal. Diarrhea or abdominal pain. Vaginal discharge, if you are female.  How is this diagnosed? This condition is diagnosed with a medical history and physical exam. You will also need to provide a urine sample to test your urine. Other tests may be done, including: Blood tests. Sexually transmitted disease (STD) testing.  If you have had more than one UTI, a cystoscopy or imaging studies may be done to determine the cause of the infections. How is this treated? Treatment for this condition often includes a combination of two or more of the following: Antibiotic medicine. Other medicines to treat less common causes of UTI. Over-the-counter medicines to treat pain. Drinking enough water to stay hydrated.  Follow these instructions at home: Take over-the-counter and prescription medicines only as told by your health care provider. If you were prescribed an antibiotic, take it as told by your health care provider. Do not stop taking the antibiotic even if you start to feel better. Avoid alcohol, caffeine, tea, and carbonated beverages. They can irritate your bladder. Drink enough fluid to keep your urine clear or pale yellow. Keep all follow-up visits as told by your health care provider. This is important. Make sure to: Empty your bladder often and completely. Do not hold urine for long periods of time. Empty your bladder before and after sex. Wipe from front to back after a bowel movement if you are female. Use each tissue one time when you wipe. Contact a health care provider if: You have back pain. You have a fever. You feel nauseous or vomit. Your symptoms do not get better after  3 days. Your symptoms go away and then return. Get help right away if: You have severe back pain or lower abdominal pain. You are vomiting and cannot keep down any medicines or water. This information is not intended to replace advice given to you by your health care provider.  Make sure you discuss any questions you have with your health care provider. Document Released: 12/05/2004 Document Revised: 08/09/2015 Document Reviewed: 01/16/2015 Elsevier Interactive Patient Education  [employer].

## 2021-12-25 NOTE — Assessment & Plan Note (Signed)
Acute Urine dip consistent with UTI We will send urine for culture She often is not taking her medication and the concern is that often she will prescribe medication but still ends up hospitalized because of that.  She also uses depends and does not get up and go to the bathroom despite being able to Stressed that she needs to get up and use the bathroom and not depend on the diapers Will give ceftriaxone 1 g IM x1 now Tomorrow start Cipro 250 mg twice daily x3 days. Although this is not the first line treatment I think this will work best for her the most effective for preventing hospitalization

## 2021-12-26 ENCOUNTER — Telehealth: Payer: Self-pay | Admitting: Internal Medicine

## 2021-12-26 NOTE — Telephone Encounter (Signed)
[facility] needs [place] to fill out a new medication list with the pt's updated medication regime. They will be faxing [place] the form and I will put it in Dr. Eilleen Kempf box as soon as I have it

## 2021-12-28 LAB — CULTURE, URINE COMPREHENSIVE

## 2022-01-03 NOTE — Telephone Encounter (Signed)
Yes, fine

## 2022-01-03 NOTE — Telephone Encounter (Signed)
Yvonne Bailey from [facility] has called and states they still have not been able to complete the evaluation for home health PT.   The family seems to have mixed opinions about keeping her home for the eval, the husband wants to work with Yvonne Bailey but the grand daughter doesn't want to accommodate [facility]'s schedule.   Yvonne Bailey wants to cancel the order for home health since the pt is not available for the eval, and the family can't decide wether or not to keep the pt home.   Please call Yvonne Bailey to advise:  [phone]

## 2022-01-03 NOTE — Telephone Encounter (Signed)
Left message for Yvonne Bailey.

## 2022-01-15 ENCOUNTER — Telehealth: Payer: Self-pay | Admitting: Internal Medicine

## 2022-01-15 NOTE — Telephone Encounter (Signed)
Patient is on Atorvastatin - son states that it is too large for her to swallow and wants to know if they can try 2 40 mg. Instead.  Please send to [facility] on [address], [place].

## 2022-01-16 MED ORDER — ATORVASTATIN CALCIUM 40 MG PO TABS: 80.0000 mg | ORAL_TABLET | Freq: Every day | ORAL | 3 refills | Status: DC

## 2022-01-16 NOTE — Telephone Encounter (Signed)
I will try to send that in.  I am not sure if insurance will cover it because it is twice the number of pills.  He could talk to the pharmacist and see if an equivalent medication such as Crestor 40 mg daily (which is equivalent to atorvastatin 80 mg dose) is a smaller pill

## 2022-01-17 NOTE — Telephone Encounter (Signed)
Attempted to reach son today a few times but unable to leave voicemail.  If he calls back please provide info that Dr. Lawerance Bach recommended.

## 2022-01-17 NOTE — Telephone Encounter (Signed)
Patients son aware and will call us back if needed

## 2022-01-23 ENCOUNTER — Other Ambulatory Visit: Payer: Self-pay | Admitting: Internal Medicine

## 2022-01-23 DIAGNOSIS — I739 Peripheral vascular disease, unspecified: Secondary | ICD-10-CM

## 2022-02-02 ENCOUNTER — Other Ambulatory Visit: Payer: Self-pay | Admitting: Internal Medicine

## 2022-02-28 ENCOUNTER — Emergency Department (HOSPITAL_COMMUNITY): Payer: Medicare HMO

## 2022-02-28 ENCOUNTER — Emergency Department (HOSPITAL_COMMUNITY)
Admission: EM | Admit: 2022-02-28 | Discharge: 2022-03-01 | Disposition: A | Discharge status: Home / Self Care | Payer: Medicare HMO | Attending: Emergency Medicine | Admitting: Emergency Medicine

## 2022-02-28 DIAGNOSIS — S0990XA Unspecified injury of head, initial encounter: Secondary | ICD-10-CM | POA: Diagnosis not present

## 2022-02-28 DIAGNOSIS — S0003XA Contusion of scalp, initial encounter: Secondary | ICD-10-CM

## 2022-02-28 DIAGNOSIS — I2510 Atherosclerotic heart disease of native coronary artery without angina pectoris: Secondary | ICD-10-CM | POA: Diagnosis not present

## 2022-02-28 DIAGNOSIS — I509 Heart failure, unspecified: Secondary | ICD-10-CM | POA: Diagnosis not present

## 2022-02-28 DIAGNOSIS — I130 Hypertensive heart and chronic kidney disease with heart failure and stage 1 through stage 4 chronic kidney disease, or unspecified chronic kidney disease: Secondary | ICD-10-CM | POA: Diagnosis not present

## 2022-02-28 DIAGNOSIS — I739 Peripheral vascular disease, unspecified: Secondary | ICD-10-CM | POA: Insufficient documentation

## 2022-02-28 DIAGNOSIS — R8290 Unspecified abnormal findings in urine: Secondary | ICD-10-CM | POA: Insufficient documentation

## 2022-02-28 DIAGNOSIS — D72829 Elevated white blood cell count, unspecified: Secondary | ICD-10-CM | POA: Diagnosis not present

## 2022-02-28 DIAGNOSIS — Z8673 Personal history of transient ischemic attack (TIA), and cerebral infarction without residual deficits: Secondary | ICD-10-CM | POA: Diagnosis not present

## 2022-02-28 DIAGNOSIS — E039 Hypothyroidism, unspecified: Secondary | ICD-10-CM | POA: Diagnosis not present

## 2022-02-28 DIAGNOSIS — Z7901 Long term (current) use of anticoagulants: Secondary | ICD-10-CM | POA: Insufficient documentation

## 2022-02-28 DIAGNOSIS — W010XXA Fall on same level from slipping, tripping and stumbling without subsequent striking against object, initial encounter: Secondary | ICD-10-CM | POA: Diagnosis not present

## 2022-02-28 DIAGNOSIS — Z7982 Long term (current) use of aspirin: Secondary | ICD-10-CM | POA: Diagnosis not present

## 2022-02-28 DIAGNOSIS — S199XXA Unspecified injury of neck, initial encounter: Secondary | ICD-10-CM | POA: Diagnosis not present

## 2022-02-28 DIAGNOSIS — W19XXXA Unspecified fall, initial encounter: Secondary | ICD-10-CM | POA: Diagnosis not present

## 2022-02-28 DIAGNOSIS — N183 Chronic kidney disease, stage 3 unspecified: Secondary | ICD-10-CM | POA: Diagnosis not present

## 2022-02-28 DIAGNOSIS — I10 Essential (primary) hypertension: Secondary | ICD-10-CM | POA: Diagnosis not present

## 2022-02-28 DIAGNOSIS — I6381 Other cerebral infarction due to occlusion or stenosis of small artery: Secondary | ICD-10-CM | POA: Diagnosis not present

## 2022-02-28 DIAGNOSIS — Z87440 Personal history of urinary (tract) infections: Secondary | ICD-10-CM | POA: Insufficient documentation

## 2022-02-28 DIAGNOSIS — S0993XA Unspecified injury of face, initial encounter: Secondary | ICD-10-CM | POA: Diagnosis not present

## 2022-02-28 DIAGNOSIS — R58 Hemorrhage, not elsewhere classified: Secondary | ICD-10-CM | POA: Diagnosis not present

## 2022-02-28 DIAGNOSIS — Z043 Encounter for examination and observation following other accident: Secondary | ICD-10-CM | POA: Diagnosis not present

## 2022-02-28 DIAGNOSIS — N390 Urinary tract infection, site not specified: Secondary | ICD-10-CM | POA: Diagnosis not present

## 2022-02-28 DIAGNOSIS — Z86718 Personal history of other venous thrombosis and embolism: Secondary | ICD-10-CM | POA: Insufficient documentation

## 2022-02-28 DIAGNOSIS — I4891 Unspecified atrial fibrillation: Secondary | ICD-10-CM | POA: Diagnosis not present

## 2022-02-28 DIAGNOSIS — I491 Atrial premature depolarization: Secondary | ICD-10-CM | POA: Diagnosis not present

## 2022-02-28 LAB — CBC WITH DIFFERENTIAL/PLATELET
Abs Immature Granulocytes: 0.08 10*3/uL — ABNORMAL HIGH (ref 0.00–0.07)
Basophils Absolute: 0.1 10*3/uL (ref 0.0–0.1)
Basophils Relative: 1 %
Eosinophils Absolute: 0.2 10*3/uL (ref 0.0–0.5)
Eosinophils Relative: 1 %
HCT: 37.9 % (ref 36.0–46.0)
Hemoglobin: 12.1 g/dL (ref 12.0–15.0)
Immature Granulocytes: 1 %
Lymphocytes Relative: 16 %
Lymphs Abs: 2.1 10*3/uL (ref 0.7–4.0)
MCH: 32.4 pg (ref 26.0–34.0)
MCHC: 31.9 g/dL (ref 30.0–36.0)
MCV: 101.3 fL — ABNORMAL HIGH (ref 80.0–100.0)
Monocytes Absolute: 1 10*3/uL (ref 0.1–1.0)
Monocytes Relative: 7 %
Neutro Abs: 9.9 10*3/uL — ABNORMAL HIGH (ref 1.7–7.7)
Neutrophils Relative %: 74 %
Platelets: 250 10*3/uL (ref 150–400)
RBC: 3.74 MIL/uL — ABNORMAL LOW (ref 3.87–5.11)
RDW: 13.1 % (ref 11.5–15.5)
WBC: 13.3 10*3/uL — ABNORMAL HIGH (ref 4.0–10.5)
nRBC: 0 % (ref 0.0–0.2)

## 2022-02-28 LAB — BASIC METABOLIC PANEL
Anion gap: 11 (ref 5–15)
BUN: 26 mg/dL — ABNORMAL HIGH (ref 8–23)
CO2: 23 mmol/L (ref 22–32)
Calcium: 9.3 mg/dL (ref 8.9–10.3)
Chloride: 104 mmol/L (ref 98–111)
Creatinine, Ser: 1.24 mg/dL — ABNORMAL HIGH (ref 0.44–1.00)
GFR, Estimated: 44 mL/min — ABNORMAL LOW (ref >60)
Glucose, Bld: 99 mg/dL (ref 70–99)
Potassium: 4.8 mmol/L (ref 3.5–5.1)
Sodium: 138 mmol/L (ref 135–145)

## 2022-02-28 LAB — I-STAT CHEM 8, ED
BUN: 33 mg/dL — ABNORMAL HIGH (ref 8–23)
Calcium, Ion: 1.05 mmol/L — ABNORMAL LOW (ref 1.15–1.40)
Chloride: 105 mmol/L (ref 98–111)
Creatinine, Ser: 1.2 mg/dL — ABNORMAL HIGH (ref 0.44–1.00)
Glucose, Bld: 96 mg/dL (ref 70–99)
HCT: 38 % (ref 36.0–46.0)
Hemoglobin: 12.9 g/dL (ref 12.0–15.0)
Potassium: 5 mmol/L (ref 3.5–5.1)
Sodium: 138 mmol/L (ref 135–145)
TCO2: 24 mmol/L (ref 22–32)

## 2022-02-28 LAB — MAGNESIUM: Magnesium: 2 mg/dL (ref 1.7–2.4)

## 2022-02-28 NOTE — ED Provider Notes (Signed)
[facility] EMERGENCY DEPARTMENT Provider Note   CSN: [id number] Arrival date & time: 02/28/22  2021     History  Chief Complaint  Patient presents with   Level 2 fall on thinners    Yvonne Bailey is a 81 y.o. female.  She has past medical history of A-fib on Eliquis, hypertension, stage III CKD, hypothyroidism, hyperlipidemia, peripheral vascular disease, DVT, diverticulosis, CHF, CAD, prior CVA, cystitis, hypokalemia, hypomagnesemia.  She presents as a level 2 trauma activation for fall on blood thinners.  She was at her home earlier today when she was walking up a step, lost her balance and fell backwards, hitting the left side of her head on the ground.  She denies loss of consciousness.  She does note pain to her head with a bump on the left side of her head.  No neck pain.  Denies pain in any of the extremities.  EMS notes that she is at her baseline alert and oriented to everything except time.  HPI     Home Medications Prior to Admission medications   Medication Sig Start Date End Date Taking? Authorizing Provider  aspirin EC 81 MG tablet Take 1 tablet (81 mg total) by mouth daily. 04/21/17   Dyann Kief, PA-C  atorvastatin (LIPITOR) 40 MG tablet Take 2 tablets (80 mg total) by mouth daily. 01/16/22   Burns, Bobette Mo, MD  benazepril (LOTENSIN) 20 MG tablet TAKE 1 AND 1/2 TABLETS(30 MG) BY MOUTH DAILY Patient taking differently: Take 30 mg by mouth daily. 02/06/21   Burns, Bobette Mo, MD  ELIQUIS 5 MG TABS tablet TAKE 1 TABLET(5 MG) BY MOUTH TWICE DAILY 01/23/22   Pincus Sanes, MD  estradiol (ESTRACE) 0.1 MG/GM vaginal cream Place 1 Applicatorful vaginally once a week. 01/15/21   [provider]  levothyroxine (SYNTHROID) 88 MCG tablet TAKE 1 TABLET(88 MCG) BY MOUTH DAILY 02/04/22   Burns, Bobette Mo, MD  MYRBETRIQ 25 MG TB24 tablet TAKE 1 TABLET(25 MG) BY MOUTH DAILY Patient taking differently: Take 25 mg by mouth daily. 04/26/21   Pincus Sanes,  MD  nitroGLYCERIN (NITROSTAT) 0.4 MG SL tablet Place 1 tablet (0.4 mg total) under the tongue every 5 (five) minutes as needed for chest pain. 09/08/15   Little Ishikawa, NP  polyethylene glycol (MIRALAX) 17 g packet Take one dose 3 times a day until bowel clear, maximum of 3 consecutive days. Patient taking differently: Take 17 g by mouth daily as needed for mild constipation. 10/30/20   Elpidio Anis, PA-C  sertraline (ZOLOFT) 100 MG tablet Take 1 tablet (100 mg total) by mouth daily. 05/24/21   Pincus Sanes, MD  spironolactone (ALDACTONE) 25 MG tablet TAKE 1 TABLET(25 MG) BY MOUTH DAILY Patient taking differently: Take 25 mg by mouth daily. 09/26/21   Pincus Sanes, MD      Allergies    Definity [perflutren lipid microsphere] and Pletal [cilostazol]    Review of Systems   Review of Systems  Physical Exam Updated Vital Signs BP (!) 145/66   Pulse 79   Temp 97.7 F (36.5 C)   Resp 18   Wt 72.6 kg   SpO2 100%   BMI 25.83 kg/m  Physical Exam Vitals and nursing note reviewed.  Constitutional:      General: She is not in acute distress.    Appearance: She is well-developed. She is not ill-appearing or diaphoretic.  HENT:     Head:     Comments:  Hematoma and ecchymosis to left parietal bone, no underlying laceration or depression    Right Ear: External ear normal.     Left Ear: External ear normal.     Nose: Nose normal.     Mouth/Throat:     Mouth: Mucous membranes are moist.     Pharynx: Oropharynx is clear.  Eyes:     Conjunctiva/sclera: Conjunctivae normal.  Cardiovascular:     Rate and Rhythm: Normal rate and regular rhythm.     Heart sounds: No murmur heard. Pulmonary:     Effort: Pulmonary effort is normal. No respiratory distress.     Breath sounds: Normal breath sounds.  Abdominal:     General: There is distension (distended and firm lower to mid abdomen, no ttp).     Palpations: Abdomen is soft.     Tenderness: There is no abdominal tenderness. There is no  guarding or rebound.  Musculoskeletal:        General: No swelling, tenderness, deformity or signs of injury.     Cervical back: Neck supple. No rigidity or tenderness.     Right lower leg: No edema.     Left lower leg: No edema.  Skin:    General: Skin is warm and dry.     Capillary Refill: Capillary refill takes less than 2 seconds.  Neurological:     General: No focal deficit present.     Mental Status: She is alert. Mental status is at baseline.     Cranial Nerves: No cranial nerve deficit.     Sensory: No sensory deficit.     Motor: No weakness.  Psychiatric:        Mood and Affect: Mood normal.        Behavior: Behavior normal.     ED Results / Procedures / Treatments   Labs (all labs ordered are listed, but only abnormal results are displayed) Labs Reviewed  CBC WITH DIFFERENTIAL/PLATELET - Abnormal; Notable for the following components:      Result Value   WBC 13.3 (*)    RBC 3.74 (*)    MCV 101.3 (*)    Neutro Abs 9.9 (*)    Abs Immature Granulocytes 0.08 (*)    All other components within normal limits  BASIC METABOLIC PANEL - Abnormal; Notable for the following components:   BUN 26 (*)    Creatinine, Ser 1.24 (*)    GFR, Estimated 44 (*)    All other components within normal limits  URINALYSIS, ROUTINE W REFLEX MICROSCOPIC - Abnormal; Notable for the following components:   Color, Urine AMBER (*)    APPearance CLOUDY (*)    Hgb urine dipstick LARGE (*)    Protein, ur 30 (*)    Leukocytes,Ua LARGE (*)    RBC / HPF >50 (*)    WBC, UA >50 (*)    Bacteria, UA MANY (*)    All other components within normal limits  I-STAT CHEM 8, ED - Abnormal; Notable for the following components:   BUN 33 (*)    Creatinine, Ser 1.20 (*)    Calcium, Ion 1.05 (*)    All other components within normal limits  URINE CULTURE  MAGNESIUM    EKG EKG Interpretation  Date/Time:  Thursday February 28 2022 20:33:13 EST Ventricular Rate:  79 PR Interval:    QRS  Duration: 88 QT Interval:  398 QTC Calculation: 456 R Axis:   63 Text Interpretation: Atrial fibrillation with premature ventricular or aberrantly conducted complexes Anterior  infarct , age undetermined Abnormal ECG When compared with ECG of 16-Nov-2021 16:33, PREVIOUS ECG IS PRESENT Confirmed by Richardean Canal ([id number]) on 02/28/2022 8:41:59 PM  Radiology CT HEAD WO CONTRAST ( )  Result Date: 02/28/2022 CLINICAL DATA:  Fall.  Head, facial, and neck trauma. EXAM: CT HEAD WITHOUT CONTRAST CT MAXILLOFACIAL WITHOUT CONTRAST CT CERVICAL SPINE WITHOUT CONTRAST TECHNIQUE: Multidetector CT imaging of the head, cervical spine, and maxillofacial structures were performed using the standard protocol without intravenous contrast. Multiplanar CT image reconstructions of the cervical spine and maxillofacial structures were also generated. RADIATION DOSE REDUCTION: This exam was performed according to the departmental dose-optimization program which includes automated exposure control, adjustment of the mA and/or kV according to patient size and/or use of iterative reconstruction technique. COMPARISON:  11/16/2021. FINDINGS: CT HEAD FINDINGS Brain: No acute intracranial hemorrhage, midline shift or mass effect. No extra-axial fluid collection. Diffuse atrophy is noted. Extensive subcortical and periventricular white matter hypodensities are present bilaterally. There is encephalomalacia in the frontoparietal region on the left. Multiple old lacunar infarcts are present in the cerebellar hemispheres. There is an old infarct adjacent to the anterior horn of the right ventricle extending into the insula on the right. Vascular: No hyperdense vessel or unexpected calcification. Skull: No acute fracture. Other: There is a large scalp hematoma over the frontal bone on the left. CT MAXILLOFACIAL FINDINGS Osseous: No acute fracture. Stable bony deformity of the left nasal bone, which may be related to old trauma. Orbits: Negative.  No traumatic or inflammatory finding. Sinuses: Mild mucosal thickening in the right maxillary sinus. Soft tissues: Scalp hematoma over the frontal bone on the left. CT CERVICAL SPINE FINDINGS Alignment: Normal. Skull base and vertebrae: No acute fracture. No primary bone lesion or focal pathologic process. Soft tissues and spinal canal: No prevertebral fluid or swelling. No visible canal hematoma. Disc levels: Multilevel intervertebral disc space narrowing, disc osteophyte formation, and facet arthropathy, most pronounced at C4-C5. Upper chest: Apical pleural scarring bilaterally. Other: None. IMPRESSION: 1. No acute intracranial hemorrhage. 2. Scalp hematoma over the frontal bone on the left. 3. Atrophy with chronic microvascular ischemic changes and old infarcts bilaterally. 4. No evidence of acute facial bone fracture. 5. Multilevel degenerative changes in the cervical spine without evidence of acute fracture. Electronically Signed   By: Thornell Sartorius M.D.   On: 02/28/2022 21:27   CT Cervical Spine Wo Contrast  Result Date: 02/28/2022 CLINICAL DATA:  Fall.  Head, facial, and neck trauma. EXAM: CT HEAD WITHOUT CONTRAST CT MAXILLOFACIAL WITHOUT CONTRAST CT CERVICAL SPINE WITHOUT CONTRAST TECHNIQUE: Multidetector CT imaging of the head, cervical spine, and maxillofacial structures were performed using the standard protocol without intravenous contrast. Multiplanar CT image reconstructions of the cervical spine and maxillofacial structures were also generated. RADIATION DOSE REDUCTION: This exam was performed according to the departmental dose-optimization program which includes automated exposure control, adjustment of the mA and/or kV according to patient size and/or use of iterative reconstruction technique. COMPARISON:  11/16/2021. FINDINGS: CT HEAD FINDINGS Brain: No acute intracranial hemorrhage, midline shift or mass effect. No extra-axial fluid collection. Diffuse atrophy is noted. Extensive subcortical  and periventricular white matter hypodensities are present bilaterally. There is encephalomalacia in the frontoparietal region on the left. Multiple old lacunar infarcts are present in the cerebellar hemispheres. There is an old infarct adjacent to the anterior horn of the right ventricle extending into the insula on the right. Vascular: No hyperdense vessel or unexpected calcification. Skull: No acute fracture. Other: There is a  large scalp hematoma over the frontal bone on the left. CT MAXILLOFACIAL FINDINGS Osseous: No acute fracture. Stable bony deformity of the left nasal bone, which may be related to old trauma. Orbits: Negative. No traumatic or inflammatory finding. Sinuses: Mild mucosal thickening in the right maxillary sinus. Soft tissues: Scalp hematoma over the frontal bone on the left. CT CERVICAL SPINE FINDINGS Alignment: Normal. Skull base and vertebrae: No acute fracture. No primary bone lesion or focal pathologic process. Soft tissues and spinal canal: No prevertebral fluid or swelling. No visible canal hematoma. Disc levels: Multilevel intervertebral disc space narrowing, disc osteophyte formation, and facet arthropathy, most pronounced at C4-C5. Upper chest: Apical pleural scarring bilaterally. Other: None. IMPRESSION: 1. No acute intracranial hemorrhage. 2. Scalp hematoma over the frontal bone on the left. 3. Atrophy with chronic microvascular ischemic changes and old infarcts bilaterally. 4. No evidence of acute facial bone fracture. 5. Multilevel degenerative changes in the cervical spine without evidence of acute fracture. Electronically Signed   By: Thornell Sartorius M.D.   On: 02/28/2022 21:27   CT Maxillofacial Wo Contrast  Result Date: 02/28/2022 CLINICAL DATA:  Fall.  Head, facial, and neck trauma. EXAM: CT HEAD WITHOUT CONTRAST CT MAXILLOFACIAL WITHOUT CONTRAST CT CERVICAL SPINE WITHOUT CONTRAST TECHNIQUE: Multidetector CT imaging of the head, cervical spine, and maxillofacial structures  were performed using the standard protocol without intravenous contrast. Multiplanar CT image reconstructions of the cervical spine and maxillofacial structures were also generated. RADIATION DOSE REDUCTION: This exam was performed according to the departmental dose-optimization program which includes automated exposure control, adjustment of the mA and/or kV according to patient size and/or use of iterative reconstruction technique. COMPARISON:  11/16/2021. FINDINGS: CT HEAD FINDINGS Brain: No acute intracranial hemorrhage, midline shift or mass effect. No extra-axial fluid collection. Diffuse atrophy is noted. Extensive subcortical and periventricular white matter hypodensities are present bilaterally. There is encephalomalacia in the frontoparietal region on the left. Multiple old lacunar infarcts are present in the cerebellar hemispheres. There is an old infarct adjacent to the anterior horn of the right ventricle extending into the insula on the right. Vascular: No hyperdense vessel or unexpected calcification. Skull: No acute fracture. Other: There is a large scalp hematoma over the frontal bone on the left. CT MAXILLOFACIAL FINDINGS Osseous: No acute fracture. Stable bony deformity of the left nasal bone, which may be related to old trauma. Orbits: Negative. No traumatic or inflammatory finding. Sinuses: Mild mucosal thickening in the right maxillary sinus. Soft tissues: Scalp hematoma over the frontal bone on the left. CT CERVICAL SPINE FINDINGS Alignment: Normal. Skull base and vertebrae: No acute fracture. No primary bone lesion or focal pathologic process. Soft tissues and spinal canal: No prevertebral fluid or swelling. No visible canal hematoma. Disc levels: Multilevel intervertebral disc space narrowing, disc osteophyte formation, and facet arthropathy, most pronounced at C4-C5. Upper chest: Apical pleural scarring bilaterally. Other: None. IMPRESSION: 1. No acute intracranial hemorrhage. 2. Scalp  hematoma over the frontal bone on the left. 3. Atrophy with chronic microvascular ischemic changes and old infarcts bilaterally. 4. No evidence of acute facial bone fracture. 5. Multilevel degenerative changes in the cervical spine without evidence of acute fracture. Electronically Signed   By: Thornell Sartorius M.D.   On: 02/28/2022 21:27   DG Pelvis 1-2 Views  Result Date: 02/28/2022 CLINICAL DATA:  Fall. EXAM: PELVIS - 1-2 VIEW COMPARISON:  None Available. FINDINGS: There is no evidence of pelvic fracture or diastasis. Moderate degenerative changes are present at the hips bilaterally.  Vascular calcifications are present in the pelvis. IMPRESSION: No acute fracture or dislocation. Electronically Signed   By: Thornell Sartorius M.D.   On: 02/28/2022 21:07   DG Chest 1 View  Result Date: 02/28/2022 CLINICAL DATA:  Fall. EXAM: CHEST  1 VIEW COMPARISON:  11/16/2021. FINDINGS: The heart is enlarged and the mediastinal contour is within normal limits. There is atherosclerotic calcification of the aorta. No consolidation, effusion, or pneumothorax. Degenerative changes are present in the thoracic spine. No acute osseous abnormality. IMPRESSION: Cardiomegaly with no active disease. Electronically Signed   By: Thornell Sartorius M.D.   On: 02/28/2022 21:04    Procedures Procedures    Medications Ordered in ED Medications - No data to display  ED Course/ Medical Decision Making/ A&P                           Medical Decision Making Problems Addressed: Fall, initial encounter: acute illness or injury that poses a threat to life or bodily functions  Amount and/or Complexity of Data Reviewed Labs: ordered. Decision-making details documented in ED Course. Radiology: ordered and independent interpretation performed. Decision-making details documented in ED Course. ECG/medicine tests: ordered and independent interpretation performed. Decision-making details documented in ED Course.   Patient is an 81 year old  female with past medical history of atrial fibrillation on Eliquis, hypertension, stage III CKD, hypothyroidism, hyperlipidemia, peripheral vascular disease, diverticulosis, DVT, CHF, CAD, prior CVA, prior UTI who presents with a fall and was a level 2 trauma due to fall on blood thinners.  On initial evaluation patient's ABCs are intact her GCS is 15.  She did not lose consciousness with the fall.  It was a mechanical fall.  She is alert and oriented at baseline, not oriented to time.  She denies pain anywhere other than the left side of her head.  Secondary exam shows hematoma with overlying abrasion to the left frontoparietal bone, without any underlying depressions suspicious for skull fracture.  She has no tenderness to palpation on cervical spine.  There is a c-collar in place on arrival.  Secondary exam otherwise is nonrevealing other than a mildly firm abdomen without tenderness to palpation.  Believe this is secondary to distended bladder, as patient does note that she needs to urinate.  Trauma labs obtained which show a baseline BMP and elevation of her creatinine to a level consistent with prior.  CBC does show leukocytosis at 13,000.  She has a normal magnesium.  Due to history of UTI with a firm distended bladder and a leukocytosis of 13,000, do believe that she needs a urinalysis to rule out UTI as a cause of her fall.  Imaging evaluation to include CT head, CT cervical spine, CT face, x-ray of chest and pelvis.  Based on secondary exam does not believe that she needs advanced imaging of the chest abdomen or pelvis.  These imaging studies were obtained which per my review show No sign of acute cardiopulmonary, normal pelvis x-ray without fracture or dislocation, and CT of the head/C-spine/face which showed no acute intracranial abnormalities, and only has a scalp hematoma over the left frontal bone, no evidence of facial fracture, no evidence of fracture or acute abnormality of the cervical  spine.  Reassessed patient, she is doing well, clinically cleared her c-collar.  States that she is ready to give [place] urine sample.  This is only thing that is left pending otherwise she is cleared for discharge home.  If her urinalysis  shows signs of urinary tract infection, would discharge her with antibiotics, if no signs of UTI, can discharge home without any antibiotics and can follow-up with primary care provider either way.  Patient was handed off to oncoming night team with this above plan.  Final Clinical Impression(s) / ED Diagnoses Final diagnoses:  Fall, initial encounter  Hematoma of scalp, initial encounter    Rx / DC Orders ED Discharge Orders     None         Gust Brooms, MD 03/01/22 Mike Gip    Charlynne Pander, MD 03/01/22 [id number]

## 2022-02-28 NOTE — ED Notes (Addendum)
Trauma Response Nurse Documentation   Yvonne Bailey is a 81 y.o. female arriving to [facility] ED via EMS  On Eliquis (apixaban) daily. Trauma was activated as a Level 2 by EDP based on the following trauma criteria Elderly patients > 65 with head trauma on anti-coagulation (excluding ASA). Trauma team at the bedside on patient arrival.   Patient cleared for CT by Dr. Darl Householder EDP. Pt transported to CT with trauma response nurse present to monitor. RN remained with the patient throughout their absence from the department for clinical observation.   GCS 14 (confused to year at baseline).  History   Past Medical History:  Diagnosis Date   Bilateral leg edema 07/19/2015   CAD (coronary artery disease)    a. anterior MI s/p PCI in 1992. b. cath 08/2015 with severe three-vessel CAD turned down for CABG and underwent DESx5 to prox Cx/OM2/D1/oRCA/mRCA   Carotid artery disease ([place])    a. carotid duplex 03/2015 showed 1-39% BICA, normal subclavian arteries, chronically occluded left vertebral, f/u recommended only PRN.   DVT (deep venous thrombosis) ([place])    X1   History of nuclear stress test    a. Myoview 6/17: EF 20-25%, mid anteroseptal, apical anterior, apical septal, apical inferior, apical lateral and apical scar, no ischemia, intermediate risk   HTN (hypertension)    Hyperlipidemia    Hypokalemia    Hypothyroidism    Ischemic cardiomyopathy    a. Echo 6/17: EF 20-25%, apex appears akinetic, MAC, moderate MR, moderate LAE, mild RVE, trivial PI, PASP 47 mmHg (needs repeat with Definity contrast).  b. Limited echo with Definity contrast 7/17: EF 25-30%, moderate to severe LAE. c. Limited Echo 2018 showed EF 40-45%.   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 02/2017 in setting of noncompliance with Eliquis   TIA (transient ischemic attack)    Tricuspid regurgitation      Past Surgical History:  Procedure Laterality Date   APPENDECTOMY     at hysterectomy and USO for fibroids, Dr. Ysidro Evert   CARDIAC CATHETERIZATION  1992   Dr Eustace Quail   CARDIAC CATHETERIZATION N/A 09/06/2015   Procedure: Right/Left Heart Cath and Coronary Angiography;  Surgeon: Burnell Blanks, MD;  Location: [place] [facility];  Service: Cardiovascular;  Laterality: N/A;   CARDIAC CATHETERIZATION N/A 09/07/2015   Procedure: Coronary Stent Intervention;  Surgeon: Burnell Blanks, MD;  Location: [place] [facility];  Service: Cardiovascular;  Laterality: N/A;   COLONOSCOPY     negative; 2008, Dr. Delfin Edis   fracture LLE     '94; pinned   IR ANGIO INTRA EXTRACRAN SEL COM CAROTID INNOMINATE BILAT MOD SED  03/02/2017   IR ANGIO INTRA EXTRACRAN SEL COM CAROTID INNOMINATE BILAT MOD SED  04/23/2018   IR ANGIO VERTEBRAL SEL SUBCLAVIAN INNOMINATE BILAT MOD SED  04/23/2018   IR ANGIO VERTEBRAL SEL VERTEBRAL UNI R MOD SED  03/02/2017   IR RADIOLOGIST EVAL & MGMT  04/28/2017   IR US GUIDE VASC ACCESS RIGHT  04/23/2018   RADIOLOGY WITH ANESTHESIA N/A 03/02/2017   Procedure: RADIOLOGY WITH ANESTHESIA;  Surgeon: Luanne Bras, MD;  Location: [place];  Service: Radiology;  Laterality: N/A;   TEE WITHOUT CARDIOVERSION N/A 11/02/2020   Procedure: TRANSESOPHAGEAL ECHOCARDIOGRAM (TEE);  Surgeon: Sanda Klein, MD;  Location: [facility];  Service: Cardiovascular;  Laterality: N/A;   TONSILLECTOMY     TOTAL ABDOMINAL HYSTERECTOMY     & BSO for fibroids       Initial Focused Assessment (If applicable, or please see trauma documentation): Alert/confused female presents after a fall two hours prior to arrival, on eliquis, hematoma left forehead, tender on  palpation to hips, abdomen No airway compromise noted, BS clear No obvious uncontrolled hemorrhage, hematoma to left forehead GCS 14 which is pts baseline CT's Completed:   CT Head, CT Maxillofacial, and CT C-Spine   Interventions:  Trauma lab draw, presents with left AC IV EMS c-collar in place Hematoma/abrasion left forehead  Plan for disposition:  Pending imaging/labs  Consults completed:  none at the time of this note.  Event Summary: Pt arrives via EMS from home after a fall on some concrete, states two hours prior to arrival. GCS 14 at baseline, usually confused to year. Hematoma and abrasion to the left side of her face, bleeding resolved on arrival to ED. Pending workup.   Bedside handoff with ED RN Vicente Males.    Elaijah Munoz O Caydence Koenig  Trauma Response RN  Please call TRN at [phone] for further assistance.

## 2022-02-28 NOTE — ED Notes (Signed)
Patient transported to CT with Irving Burton, TRN

## 2022-02-28 NOTE — ED Triage Notes (Signed)
Pt in from home via [facility], as level 2 fall on Eliquis. Pt tripped going down 1 stair, hit L frontal head - hematoma present. Pt doesn't believe she had LOC, baseline is a&ox3. Pt arrives GCS 14 at this time, answers all orientation questions except time. C-collar on, pt denies any other injuries at this time. manual BP - 122/68 on ED arrival

## 2022-03-01 LAB — URINALYSIS, ROUTINE W REFLEX MICROSCOPIC
Bilirubin Urine: NEGATIVE
Glucose, UA: NEGATIVE mg/dL
Ketones, ur: NEGATIVE mg/dL
Nitrite: NEGATIVE
Protein, ur: 30 mg/dL — AB
RBC / HPF: >50 RBC/hpf — ABNORMAL HIGH (ref 0–5)
Specific Gravity, Urine: 1.009 (ref 1.005–1.030)
WBC, UA: >50 WBC/hpf — ABNORMAL HIGH (ref 0–5)
pH: 5 (ref 5.0–8.0)

## 2022-03-01 MED ORDER — CEPHALEXIN 500 MG PO CAPS: 500.0000 mg | ORAL_CAPSULE | Freq: Four times a day (QID) | ORAL | 0 refills | Status: DC

## 2022-03-01 MED ORDER — SODIUM CHLORIDE 0.9 % IV BOLUS
1000.0000 mL | Freq: Once | INTRAVENOUS | Status: AC
  Administered 2022-03-01: 1000 mL via INTRAVENOUS

## 2022-03-01 MED ORDER — SODIUM CHLORIDE 0.9 % IV SOLN
2.0000 g | Freq: Once | INTRAVENOUS | Status: AC
  Administered 2022-03-01: 2 g via INTRAVENOUS
  Filled 2022-03-01: qty 20

## 2022-03-01 NOTE — ED Provider Notes (Signed)
4:14 AM Assumed care from Drs Brunetta Jeans and Silverio Lay, please see their note for full history, physical and decision making until this point. In brief this is a 81 y.o. year old female who presented to the ED tonight with Level 2 fall on thinners     81 year old female here after multiple falls and generalized weakness.  Trauma workup is negative but her white count bumped a little bit so awaiting urinalysis that she has a urinary tract infection in the past.  My evaluation she appears well aside from mild head trauma.  She states she feels well states think she is eating and drinking okay.  Her urinalysis did ultimately result consistent with likely urinary tract infection.  Rocephin fluids given here.  Patient still feels well and is vitally stable.  Will plan for outpatient antibiotics and PCP follow-up.  Discharge instructions, including strict return precautions for new or worsening symptoms, given. Patient and/or family verbalized understanding and agreement with the plan as described.   Labs, studies and imaging reviewed by myself and considered in medical decision making if ordered. Imaging interpreted by radiology.  Labs Reviewed  CBC WITH DIFFERENTIAL/PLATELET - Abnormal; Notable for the following components:      Result Value   WBC 13.3 (*)    RBC 3.74 (*)    MCV 101.3 (*)    Neutro Abs 9.9 (*)    Abs Immature Granulocytes 0.08 (*)    All other components within normal limits  BASIC METABOLIC PANEL - Abnormal; Notable for the following components:   BUN 26 (*)    Creatinine, Ser 1.24 (*)    GFR, Estimated 44 (*)    All other components within normal limits  URINALYSIS, ROUTINE W REFLEX MICROSCOPIC - Abnormal; Notable for the following components:   Color, Urine AMBER (*)    APPearance CLOUDY (*)    Hgb urine dipstick LARGE (*)    Protein, ur 30 (*)    Leukocytes,Ua LARGE (*)    RBC / HPF >50 (*)    WBC, UA >50 (*)    Bacteria, UA MANY (*)    All other components within normal  limits  I-STAT CHEM 8, ED - Abnormal; Notable for the following components:   BUN 33 (*)    Creatinine, Ser 1.20 (*)    Calcium, Ion 1.05 (*)    All other components within normal limits  URINE CULTURE  MAGNESIUM    DG Chest 1 View  Final Result    DG Pelvis 1-2 Views  Final Result    CT HEAD WO CONTRAST ( )  Final Result    CT Cervical Spine Wo Contrast  Final Result    CT Maxillofacial Wo Contrast  Final Result      No follow-ups on file.    Guido Comp, Barbara Cower, MD 03/01/22 [phone]

## 2022-03-05 ENCOUNTER — Other Ambulatory Visit: Payer: Self-pay | Admitting: Internal Medicine

## 2022-03-05 LAB — URINE CULTURE: Culture: >=100000 — AB

## 2022-03-06 ENCOUNTER — Telehealth (HOSPITAL_BASED_OUTPATIENT_CLINIC_OR_DEPARTMENT_OTHER): Payer: Self-pay

## 2022-03-06 NOTE — Progress Notes (Signed)
ED Antimicrobial Stewardship Positive Culture Follow Up   Yvonne Bailey is an 81 y.o. female who presented to [facility] on 02/28/2022 with a chief complaint of  Chief Complaint  Patient presents with   Level 2 fall on thinners    Recent Results (from the past 720 hour(s))  Urine Culture     Status: Abnormal   Collection Time: 02/28/22 11:43 PM   Specimen: Urine, Clean Catch  Result Value Ref Range Status   Specimen Description URINE, CLEAN CATCH  Final   Special Requests   Final    NONE Performed at [facility], [address], [place], [place] [address]    Culture >=100,000 COLONIES/mL ENTEROCOCCUS FAECALIS (A)  Final   Report Status 03/05/2022 FINAL  Final   Organism ID, Bacteria ENTEROCOCCUS FAECALIS (A)  Final      Susceptibility   Enterococcus faecalis - MIC*    AMPICILLIN <=2 SENSITIVE Sensitive     NITROFURANTOIN <=16 SENSITIVE Sensitive     VANCOMYCIN 1 SENSITIVE Sensitive     * >=100,000 COLONIES/mL ENTEROCOCCUS FAECALIS    [x]  Treated with cephalexin, organism resistant to prescribed antimicrobial []  Patient discharged originally without antimicrobial agent and treatment is now indicated  New antibiotic prescription: amoxicillin 875mg  PO BID x 7 days  ED Provider: , MD   , PharmD 03/06/2022, 2:23 PM Clinical Pharmacist Monday - Friday phone -  [phone] Saturday - Sunday phone - [phone]

## 2022-03-06 NOTE — Telephone Encounter (Signed)
Post ED Visit - Positive Culture Follow-up: Unsuccessful Patient Follow-up  Culture assessed and recommendations reviewed by:  [x]  Lorin , Pharm.D. []  Alvester Morin, Pharm.D., BCPS AQ-ID []  , Pharm.D., BCPS []  Celedonio Miyamoto, Pharm.D., BCPS []  [place], Garvin Fila.D., BCPS, AAHIVP []  , Pharm.D., BCPS, AAHIVP []  Georgina Pillion, PharmD []  , PharmD, BCPS  Positive Urine culture  []  Patient discharged without antimicrobial prescription and treatment is now indicated [x]  Organism is resistant to prescribed ED discharge antimicrobial []  Patient with positive blood cultures  Plan: Stop Cephalexin and  Start Amoxicillin 875 mg po BID x 7 days per ED provider [place], MD   Unable to contact patient after 3 attempts, letter will be sent to address on file  [address] 03/06/2022, 12:54 PM

## 2022-03-17 DIAGNOSIS — W19XXXA Unspecified fall, initial encounter: Secondary | ICD-10-CM | POA: Insufficient documentation

## 2022-03-17 NOTE — Progress Notes (Unsigned)
Subjective:    Patient ID: Yvonne Bailey, female    DOB: 21-Jun-1940, 82 y.o.   MRN: [id number]     HPI Yvonne Bailey is here for follow up of her hospital stay  12/21 - went to ED after fall at home - walking up a step , lost balance and fell backwards - hit left side of head.  No LOC.  Xray/CT w/o fracture/acute trauma.  UTI noted - received Rocephin and fluids.  D/c'd on keflex 4 times a day x 10 days  Has CNA daily except Sunday.  Goes to [facility] health services [facility]).  Needs paperwork filled out to return to the care center-needs this done once a year.  Overall no changes besides the above.  She is taking her medications but not always exactly the time and that she should.  Medications and allergies reviewed with patient and updated if appropriate.  Current Outpatient Medications on File Prior to Visit  Medication Sig Dispense Refill   aspirin EC 81 MG tablet Take 1 tablet (81 mg total) by mouth daily. 90 tablet 3   atorvastatin (LIPITOR) 40 MG tablet Take 2 tablets (80 mg total) by mouth daily. 180 tablet 3   ELIQUIS 5 MG TABS tablet TAKE 1 TABLET(5 MG) BY MOUTH TWICE DAILY 180 tablet 0   estradiol (ESTRACE) 0.1 MG/GM vaginal cream Place 1 Applicatorful vaginally once a week.     levothyroxine (SYNTHROID) 88 MCG tablet TAKE 1 TABLET(88 MCG) BY MOUTH DAILY 90 tablet 1   MYRBETRIQ 25 MG TB24 tablet TAKE 1 TABLET(25 MG) BY MOUTH DAILY (Patient taking differently: Take 25 mg by mouth daily.) 30 tablet 5   nitroGLYCERIN (NITROSTAT) 0.4 MG SL tablet Place 1 tablet (0.4 mg total) under the tongue every 5 (five) minutes as needed for chest pain. 25 tablet 1   polyethylene glycol (MIRALAX) 17 g packet Take one dose 3 times a day until bowel clear, maximum of 3 consecutive days. (Patient taking differently: Take 17 g by mouth daily as needed for mild constipation.) 9 each 0   sertraline (ZOLOFT) 100 MG tablet Take 1 tablet (100 mg total) by mouth daily.  90 tablet 3   spironolactone (ALDACTONE) 25 MG tablet TAKE 1 TABLET(25 MG) BY MOUTH DAILY (Patient taking differently: Take 25 mg by mouth daily.) 90 tablet 1   No current facility-administered medications on file prior to visit.     Review of Systems  Constitutional:  Negative for appetite change, chills and fever.  Respiratory:  Negative for cough, shortness of breath and wheezing.   Cardiovascular:  Negative for chest pain, palpitations and leg swelling.  Gastrointestinal:  Negative for abdominal pain, constipation, diarrhea and nausea.       No gerd  Neurological:  Negative for light-headedness and headaches.  Psychiatric/Behavioral:  Positive for dysphoric mood. The patient is nervous/anxious.        Objective:   Vitals:   03/18/22 1419  BP: 110/72  Pulse: 72  Temp: (!) 97.5 F (36.4 C)  SpO2: 100%   BP Readings from Last 3 Encounters:  03/18/22 110/72  03/01/22 137/78  12/25/21 136/88   Wt Readings from Last 3 Encounters:  03/01/22 160 lb 0.9 oz (72.6 kg)  12/25/21 160 lb (72.6 kg)  11/16/21 160 lb 11.5 oz (72.9 kg)   There is no height or weight on file to calculate BMI.    Physical Exam Constitutional:      General: She is  not in acute distress.    Appearance: Normal appearance.  HENT:     Head: Normocephalic and atraumatic.  Eyes:     Conjunctiva/sclera: Conjunctivae normal.  Cardiovascular:     Rate and Rhythm: Normal rate and regular rhythm.     Heart sounds: Normal heart sounds. No murmur heard. Pulmonary:     Effort: Pulmonary effort is normal. No respiratory distress.     Breath sounds: Normal breath sounds. No wheezing.  Musculoskeletal:     Cervical back: Neck supple.     Right lower leg: No edema.     Left lower leg: No edema.  Lymphadenopathy:     Cervical: No cervical adenopathy.  Skin:    General: Skin is warm and dry.     Findings: No rash.  Neurological:     Mental Status: She is alert. Mental status is at baseline.  Psychiatric:         Mood and Affect: Mood normal.        Behavior: Behavior normal.        Lab Results  Component Value Date   WBC 13.3 (H) 02/28/2022   HGB 12.9 02/28/2022   HCT 38.0 02/28/2022   PLT 250 02/28/2022   GLUCOSE 96 02/28/2022   CHOL 108 11/06/2021   TRIG 61.0 11/06/2021   HDL 44.00 11/06/2021   LDLCALC 52 11/06/2021   ALT 15 11/17/2021   AST 27 11/17/2021   NA 138 02/28/2022   K 5.0 02/28/2022   CL 105 02/28/2022   CREATININE 1.20 (H) 02/28/2022   BUN 33 (H) 02/28/2022   CO2 23 02/28/2022   TSH 11.897 (H) 11/16/2021   INR 1.8 (H) 09/02/2021   HGBA1C 5.3 11/06/2021     Assessment & Plan:    See Problem List for Assessment and Plan of chronic medical problems.

## 2022-03-17 NOTE — Patient Instructions (Addendum)
      Blood work was ordered.   The lab is on the first floor.    Medications changes include :   none     Return in about 6 months (around 09/16/2022) for follow up.

## 2022-03-18 ENCOUNTER — Ambulatory Visit (INDEPENDENT_AMBULATORY_CARE_PROVIDER_SITE_OTHER): Payer: Medicare HMO | Admitting: Internal Medicine

## 2022-03-18 ENCOUNTER — Encounter: Payer: Self-pay | Admitting: Internal Medicine

## 2022-03-18 VITALS — BP 110/72 | HR 72 | Temp 97.5°F

## 2022-03-18 DIAGNOSIS — E038 Other specified hypothyroidism: Secondary | ICD-10-CM | POA: Diagnosis not present

## 2022-03-18 DIAGNOSIS — R7303 Prediabetes: Secondary | ICD-10-CM | POA: Diagnosis not present

## 2022-03-18 DIAGNOSIS — N1831 Chronic kidney disease, stage 3a: Secondary | ICD-10-CM | POA: Diagnosis not present

## 2022-03-18 DIAGNOSIS — I10 Essential (primary) hypertension: Secondary | ICD-10-CM | POA: Diagnosis not present

## 2022-03-18 DIAGNOSIS — F419 Anxiety disorder, unspecified: Secondary | ICD-10-CM | POA: Diagnosis not present

## 2022-03-18 DIAGNOSIS — F32A Depression, unspecified: Secondary | ICD-10-CM

## 2022-03-18 DIAGNOSIS — I4819 Other persistent atrial fibrillation: Secondary | ICD-10-CM

## 2022-03-18 DIAGNOSIS — N3001 Acute cystitis with hematuria: Secondary | ICD-10-CM | POA: Diagnosis not present

## 2022-03-18 DIAGNOSIS — W19XXXD Unspecified fall, subsequent encounter: Secondary | ICD-10-CM | POA: Diagnosis not present

## 2022-03-18 DIAGNOSIS — R350 Frequency of micturition: Secondary | ICD-10-CM

## 2022-03-18 DIAGNOSIS — R2681 Unsteadiness on feet: Secondary | ICD-10-CM

## 2022-03-18 DIAGNOSIS — E785 Hyperlipidemia, unspecified: Secondary | ICD-10-CM | POA: Diagnosis not present

## 2022-03-18 DIAGNOSIS — I5022 Chronic systolic (congestive) heart failure: Secondary | ICD-10-CM

## 2022-03-18 LAB — COMPREHENSIVE METABOLIC PANEL
ALT: 20 U/L (ref 0–35)
AST: 27 U/L (ref 0–37)
Albumin: 4 g/dL (ref 3.5–5.2)
Alkaline Phosphatase: 105 U/L (ref 39–117)
BUN: 26 mg/dL — ABNORMAL HIGH (ref 6–23)
CO2: 28 mEq/L (ref 19–32)
Calcium: 9.1 mg/dL (ref 8.4–10.5)
Chloride: 105 mEq/L (ref 96–112)
Creatinine, Ser: 1.16 mg/dL (ref 0.40–1.20)
GFR: 44.16 mL/min — ABNORMAL LOW (ref >60.00)
Glucose, Bld: 87 mg/dL (ref 70–99)
Potassium: 4.5 mEq/L (ref 3.5–5.1)
Sodium: 140 mEq/L (ref 135–145)
Total Bilirubin: 0.6 mg/dL (ref 0.2–1.2)
Total Protein: 7.3 g/dL (ref 6.0–8.3)

## 2022-03-18 LAB — CBC WITH DIFFERENTIAL/PLATELET
Basophils Absolute: 0.1 10*3/uL (ref 0.0–0.1)
Basophils Relative: 1 % (ref 0.0–3.0)
Eosinophils Absolute: 0.3 10*3/uL (ref 0.0–0.7)
Eosinophils Relative: 3.9 % (ref 0.0–5.0)
HCT: 38.9 % (ref 36.0–46.0)
Hemoglobin: 12.7 g/dL (ref 12.0–15.0)
Lymphocytes Relative: 27.3 % (ref 12.0–46.0)
Lymphs Abs: 2 10*3/uL (ref 0.7–4.0)
MCHC: 32.7 g/dL (ref 30.0–36.0)
MCV: 99.6 fl (ref 78.0–100.0)
Monocytes Absolute: 0.7 10*3/uL (ref 0.1–1.0)
Monocytes Relative: 9.3 % (ref 3.0–12.0)
Neutro Abs: 4.3 10*3/uL (ref 1.4–7.7)
Neutrophils Relative %: 58.5 % (ref 43.0–77.0)
Platelets: 265 10*3/uL (ref 150.0–400.0)
RBC: 3.9 Mil/uL (ref 3.87–5.11)
RDW: 14.5 % (ref 11.5–15.5)
WBC: 7.4 10*3/uL (ref 4.0–10.5)

## 2022-03-18 LAB — LIPID PANEL
Cholesterol: 131 mg/dL (ref 0–200)
HDL: 49.2 mg/dL (ref >39.00)
LDL Cholesterol: 67 mg/dL (ref 0–99)
NonHDL: 81.8
Total CHOL/HDL Ratio: 3
Triglycerides: 73 mg/dL (ref 0.0–149.0)
VLDL: 14.6 mg/dL (ref 0.0–40.0)

## 2022-03-18 LAB — HEMOGLOBIN A1C: Hgb A1c MFr Bld: 5.2 % (ref 4.6–6.5)

## 2022-03-18 LAB — TSH: TSH: 6.13 u[IU]/mL — ABNORMAL HIGH (ref 0.35–5.50)

## 2022-03-18 NOTE — Assessment & Plan Note (Signed)
Chronic  Clinically euthyroid Check tsh and will titrate med dose if needed Currently taking levothyroxine 88 mcg daily 

## 2022-03-18 NOTE — Assessment & Plan Note (Signed)
Chronic CMP, CBC 

## 2022-03-18 NOTE — Assessment & Plan Note (Signed)
Recurrent No recent falls

## 2022-03-18 NOTE — Assessment & Plan Note (Signed)
Chronic Blood pressure well controlled CMP Continue spironolactone 25 mg daily 

## 2022-03-18 NOTE — Assessment & Plan Note (Signed)
Recent UTI Has history of frequent UTIs She did not take the antibiotic as prescribed She does have urinary frequency although this is chronic Will check UA urine culture to make sure infection is cleared

## 2022-03-18 NOTE — Assessment & Plan Note (Signed)
Chronic She does have some leg edema which is likely dependent in nature-she appears to be euvolemic Follows with cardiology Continue spironolactone 25 mg daily

## 2022-03-18 NOTE — Assessment & Plan Note (Signed)
Chronic Check a1c Low sugar / carb diet Stressed regular exercise  

## 2022-03-18 NOTE — Assessment & Plan Note (Signed)
Chronic Regular exercise and healthy diet encouraged Check lipid panel  Continue atorvastatin 40 mg daily 

## 2022-03-18 NOTE — Assessment & Plan Note (Signed)
Chronic Following with cardiology Asymptomatic On Eliquis 5 mg twice daily, rate controlled CBC, CMP

## 2022-03-18 NOTE — Assessment & Plan Note (Signed)
Chronic Controlled, Stable Continue sertraline 100 mg daily 

## 2022-03-19 LAB — URINALYSIS, ROUTINE W REFLEX MICROSCOPIC
Bilirubin Urine: NEGATIVE
Ketones, ur: NEGATIVE
Nitrite: NEGATIVE
Specific Gravity, Urine: >=1.03 — AB (ref 1.000–1.030)
Total Protein, Urine: 30 — AB
Urine Glucose: NEGATIVE
Urobilinogen, UA: 0.2 (ref 0.0–1.0)
pH: 5.5 (ref 5.0–8.0)

## 2022-03-19 MED ORDER — LEVOTHYROXINE SODIUM 100 MCG PO TABS: 100.0000 ug | ORAL_TABLET | Freq: Every day | ORAL | 3 refills | Status: DC

## 2022-03-19 NOTE — Addendum Note (Signed)
Addended by: Binnie Rail on: 03/19/2022 07:53 AM   Modules accepted: Orders

## 2022-03-20 ENCOUNTER — Telehealth: Payer: Self-pay | Admitting: Internal Medicine

## 2022-03-20 LAB — URINE CULTURE

## 2022-03-20 NOTE — Telephone Encounter (Signed)
Printed of copy med list faxed to number provided.

## 2022-03-20 NOTE — Telephone Encounter (Signed)
Yvonne Bailey with [name] in [place] called needing the doctors orders for two medications that the patient takes; Eliquis and Atorvastatin. She needs in writing the date order, frequency, and dosage. It can be faxed with attention to Yvonne Bailey at [phone].

## 2022-03-21 MED ORDER — NITROFURANTOIN MONOHYD MACRO 100 MG PO CAPS: 100.0000 mg | ORAL_CAPSULE | Freq: Two times a day (BID) | ORAL | 0 refills | Status: DC

## 2022-03-21 NOTE — Addendum Note (Signed)
Addended by: Binnie Rail on: 03/21/2022 05:33 AM   Modules accepted: Orders

## 2022-03-22 NOTE — Telephone Encounter (Signed)
Paper refaxed and conformation received.

## 2022-03-22 NOTE — Telephone Encounter (Signed)
Eliezer Lofts called back and said that fax machine was down and asked if it could please be faxed again

## 2022-04-03 IMAGING — CT CT RENAL STONE PROTOCOL
2 of 4 series · 17 of 46 positions shown, 19 images · non-contrast
Comparison: 10/30/2020

CLINICAL DATA: Hematuria

EXAM:
CT ABDOMEN AND PELVIS WITHOUT CONTRAST
TECHNIQUE: Multidetector CT imaging of the abdomen and pelvis was performed
following the standard protocol without IV contrast.

[Series 3: ap without · axial · non-contrast · 0.98mm/px · z∈[+754,+1119]mm · 14 of 83 slices shown, 16 images]
[im 5/83  soft-tissue]
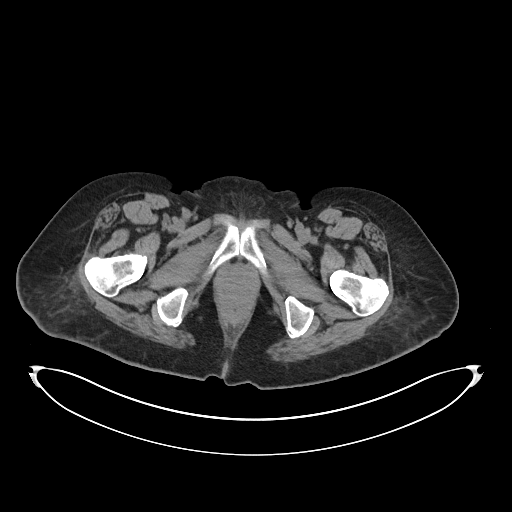
[im 5/83  bone]
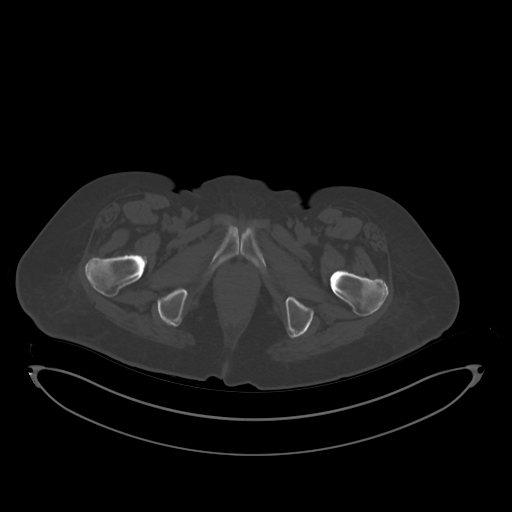
[im 10/83  soft-tissue]
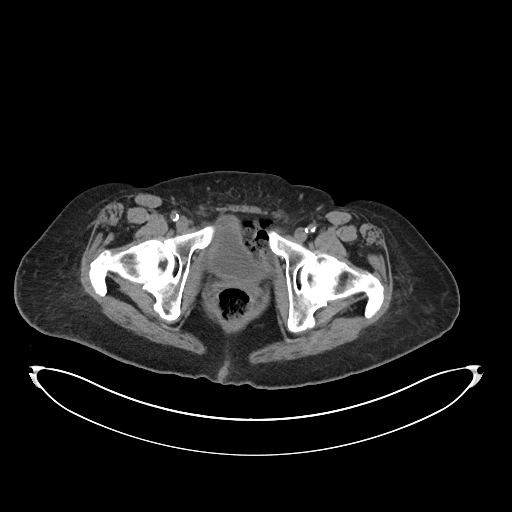
[im 15/83  soft-tissue]
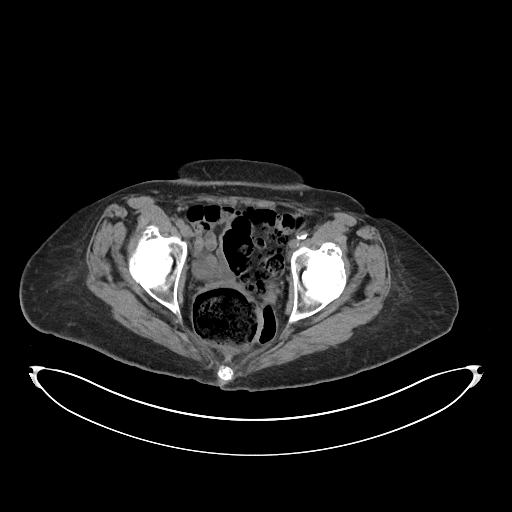
[im 25/83  soft-tissue]
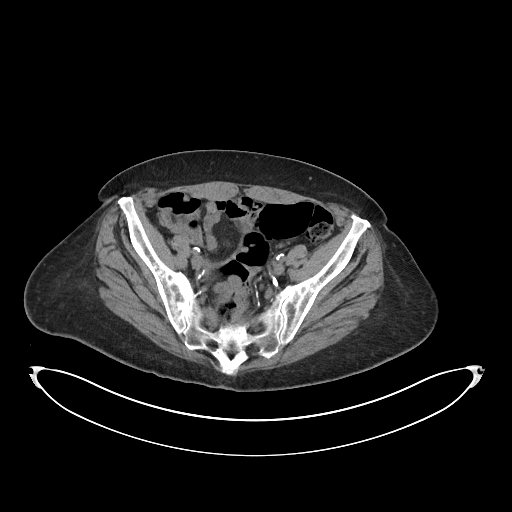
[im 29/83  soft-tissue]
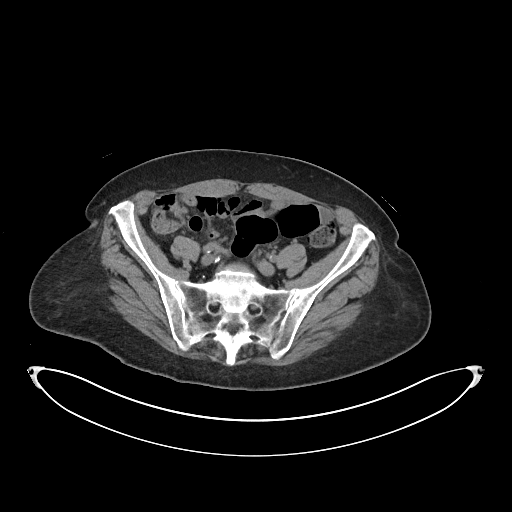
[im 34/83  soft-tissue]
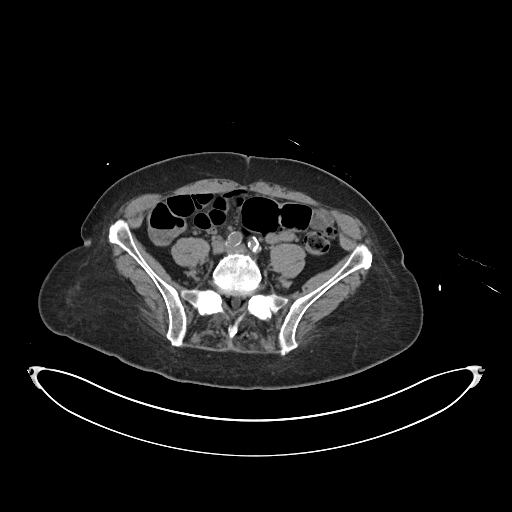
[im 39/83  soft-tissue]
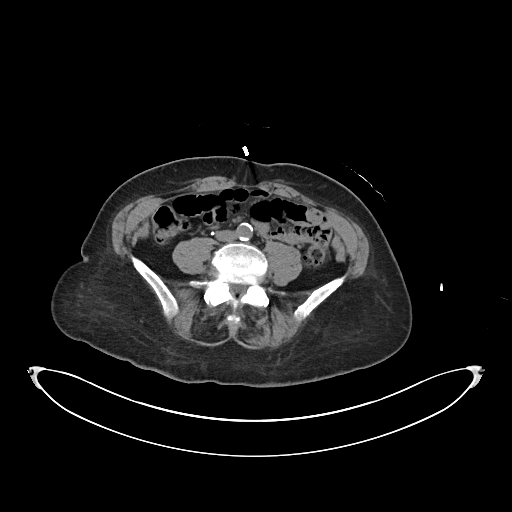
[im 44/83  soft-tissue]
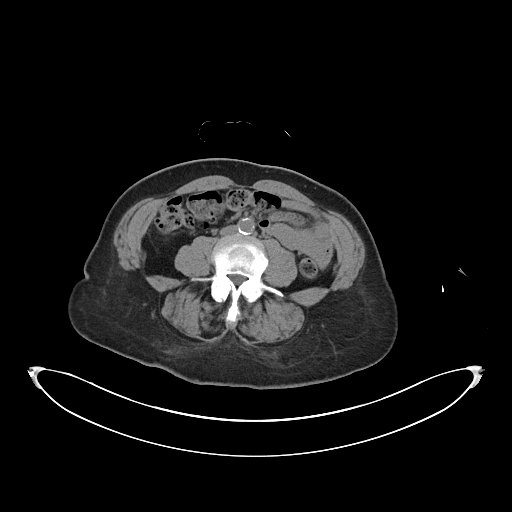
[im 49/83  soft-tissue]
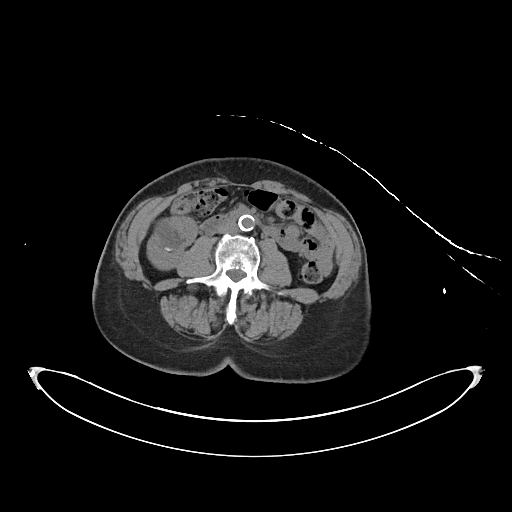
[im 49/83  bone]
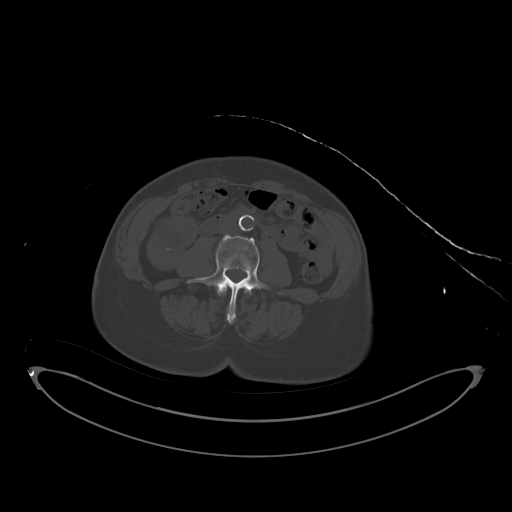
[im 54/83  soft-tissue]
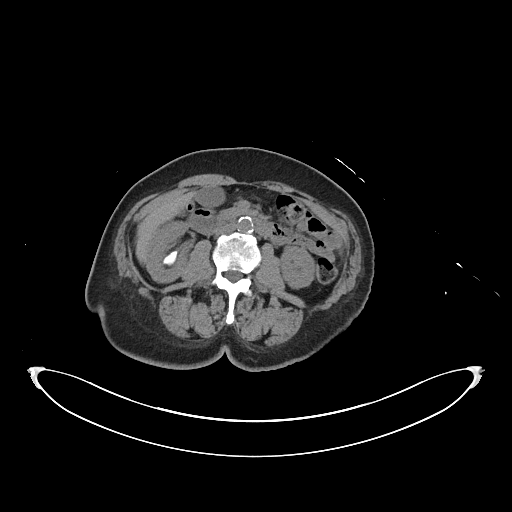
[im 63/83  soft-tissue]
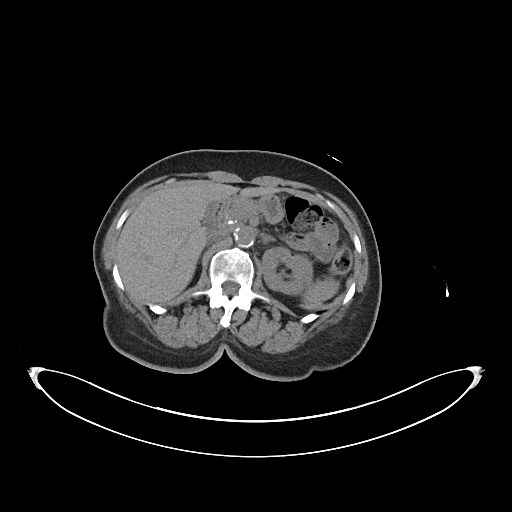
[im 68/83  soft-tissue]
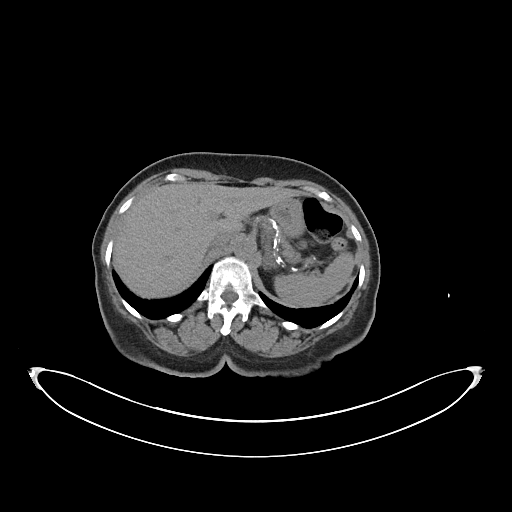
[im 73/83  soft-tissue]
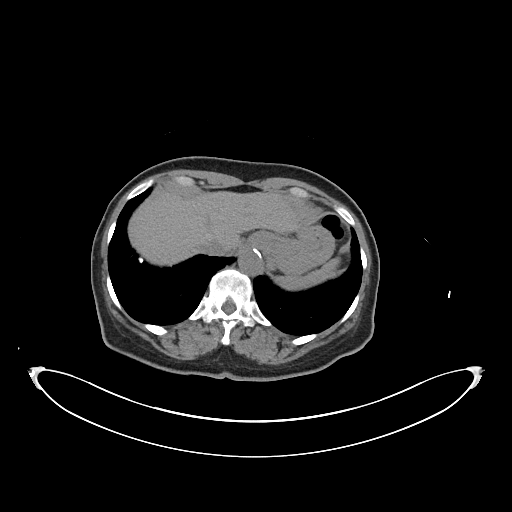
[im 78/83  soft-tissue]
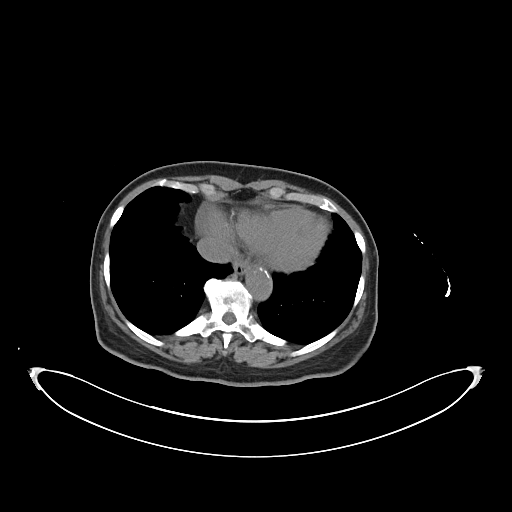

[Series 6: cor · coronal · 0.81mm/px · 3 of 82 slices shown]
[im 28/82  soft-tissue]
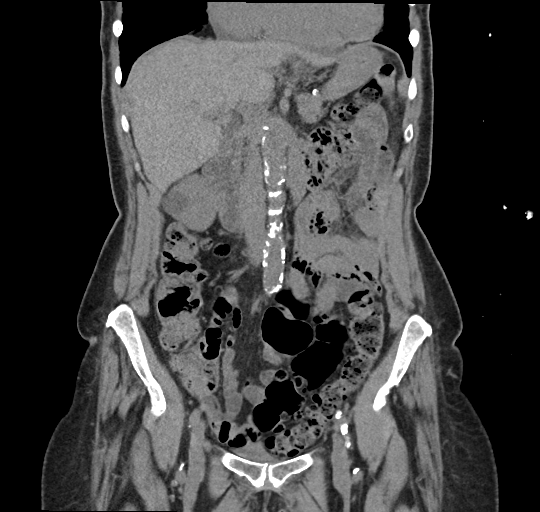
[im 37/82  soft-tissue]
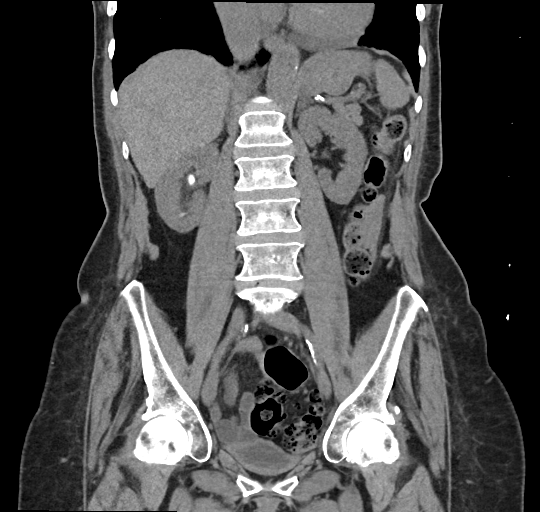
[im 46/82  soft-tissue]
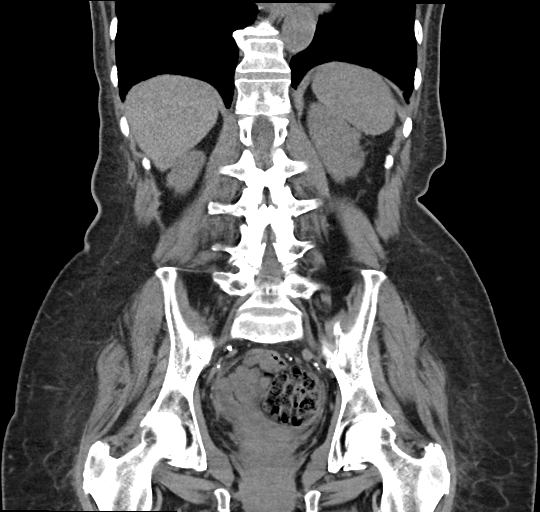

[17 of 46 positions shown; findings below may reference images not displayed]

FINDINGS: Lower chest: No acute abnormality.

Hepatobiliary: No focal liver abnormality is seen. No gallstones,
gallbladder wall thickening, or biliary dilatation.

Pancreas: Unremarkable. No pancreatic ductal dilatation or
surrounding inflammatory changes.

Spleen: Normal in size without focal abnormality.

Adrenals/Urinary Tract: Adrenal glands are within normal limits.
Cystic lesions are noted in the kidneys bilaterally stable in
appearance from the prior exam. Nonobstructing right renal stone is
noted in the upper pole measuring up to 17 mm. Nonobstructing lower
pole stone is noted as well stable in appearance from the prior
exam. No left-sided calculi are noted. No obstructive changes are
seen. Bladder is decompressed.

Stomach/Bowel: Scattered diverticular change of the colon is noted
without evidence of diverticulitis. No obstructive or inflammatory
changes are seen. The appendix is not well visualized consistent
with the prior surgical history. Small bowel and stomach are within
normal limits.

Vascular/Lymphatic: Aortic atherosclerosis. No enlarged abdominal or
pelvic lymph nodes.

Reproductive: Status post hysterectomy. No adnexal masses.

Other: No abdominal wall hernia or abnormality. No abdominopelvic
ascites.

Musculoskeletal: Degenerative changes of lumbar spine are noted.
IMPRESSION: Nonobstructing right renal stones similar to that seen on the prior
exam.

Bilateral renal cysts.

Diverticulosis without diverticulitis.

## 2022-04-23 ENCOUNTER — Other Ambulatory Visit: Payer: Self-pay

## 2022-04-23 ENCOUNTER — Observation Stay (HOSPITAL_COMMUNITY): Payer: Medicare HMO

## 2022-04-23 ENCOUNTER — Emergency Department (HOSPITAL_COMMUNITY): Payer: Medicare HMO

## 2022-04-23 ENCOUNTER — Inpatient Hospital Stay (HOSPITAL_COMMUNITY)
Admission: EM | Admit: 2022-04-23 | Discharge: 2022-04-30 | DRG: 689 | Disposition: A | Discharge status: Skilled Nursing Facility | Payer: Medicare HMO | Attending: Internal Medicine | Admitting: Internal Medicine

## 2022-04-23 ENCOUNTER — Encounter (HOSPITAL_COMMUNITY): Payer: Self-pay | Admitting: Internal Medicine

## 2022-04-23 DIAGNOSIS — Z7901 Long term (current) use of anticoagulants: Secondary | ICD-10-CM | POA: Diagnosis not present

## 2022-04-23 DIAGNOSIS — W19XXXA Unspecified fall, initial encounter: Secondary | ICD-10-CM | POA: Diagnosis present

## 2022-04-23 DIAGNOSIS — Z79899 Other long term (current) drug therapy: Secondary | ICD-10-CM

## 2022-04-23 DIAGNOSIS — I129 Hypertensive chronic kidney disease with stage 1 through stage 4 chronic kidney disease, or unspecified chronic kidney disease: Secondary | ICD-10-CM | POA: Diagnosis not present

## 2022-04-23 DIAGNOSIS — G9341 Metabolic encephalopathy: Secondary | ICD-10-CM | POA: Diagnosis present

## 2022-04-23 DIAGNOSIS — R278 Other lack of coordination: Secondary | ICD-10-CM | POA: Diagnosis not present

## 2022-04-23 DIAGNOSIS — R2681 Unsteadiness on feet: Secondary | ICD-10-CM | POA: Diagnosis not present

## 2022-04-23 DIAGNOSIS — R2689 Other abnormalities of gait and mobility: Secondary | ICD-10-CM | POA: Diagnosis not present

## 2022-04-23 DIAGNOSIS — E785 Hyperlipidemia, unspecified: Secondary | ICD-10-CM | POA: Diagnosis not present

## 2022-04-23 DIAGNOSIS — Z86718 Personal history of other venous thrombosis and embolism: Secondary | ICD-10-CM

## 2022-04-23 DIAGNOSIS — N182 Chronic kidney disease, stage 2 (mild): Secondary | ICD-10-CM | POA: Diagnosis present

## 2022-04-23 DIAGNOSIS — E038 Other specified hypothyroidism: Secondary | ICD-10-CM | POA: Diagnosis not present

## 2022-04-23 DIAGNOSIS — F419 Anxiety disorder, unspecified: Secondary | ICD-10-CM | POA: Diagnosis not present

## 2022-04-23 DIAGNOSIS — M6281 Muscle weakness (generalized): Secondary | ICD-10-CM | POA: Diagnosis not present

## 2022-04-23 DIAGNOSIS — N390 Urinary tract infection, site not specified: Principal | ICD-10-CM | POA: Diagnosis present

## 2022-04-23 DIAGNOSIS — I255 Ischemic cardiomyopathy: Secondary | ICD-10-CM | POA: Diagnosis present

## 2022-04-23 DIAGNOSIS — R21 Rash and other nonspecific skin eruption: Secondary | ICD-10-CM | POA: Diagnosis not present

## 2022-04-23 DIAGNOSIS — E876 Hypokalemia: Secondary | ICD-10-CM | POA: Diagnosis not present

## 2022-04-23 DIAGNOSIS — I2510 Atherosclerotic heart disease of native coronary artery without angina pectoris: Secondary | ICD-10-CM | POA: Diagnosis present

## 2022-04-23 DIAGNOSIS — E039 Hypothyroidism, unspecified: Secondary | ICD-10-CM | POA: Diagnosis not present

## 2022-04-23 DIAGNOSIS — I10 Essential (primary) hypertension: Secondary | ICD-10-CM | POA: Diagnosis present

## 2022-04-23 DIAGNOSIS — R4182 Altered mental status, unspecified: Secondary | ICD-10-CM | POA: Diagnosis not present

## 2022-04-23 DIAGNOSIS — F4489 Other dissociative and conversion disorders: Secondary | ICD-10-CM | POA: Diagnosis not present

## 2022-04-23 DIAGNOSIS — Z8673 Personal history of transient ischemic attack (TIA), and cerebral infarction without residual deficits: Secondary | ICD-10-CM

## 2022-04-23 DIAGNOSIS — Z7982 Long term (current) use of aspirin: Secondary | ICD-10-CM

## 2022-04-23 DIAGNOSIS — I4891 Unspecified atrial fibrillation: Secondary | ICD-10-CM | POA: Diagnosis present

## 2022-04-23 DIAGNOSIS — Z751 Person awaiting admission to adequate facility elsewhere: Secondary | ICD-10-CM | POA: Diagnosis not present

## 2022-04-23 DIAGNOSIS — W19XXXD Unspecified fall, subsequent encounter: Secondary | ICD-10-CM | POA: Diagnosis not present

## 2022-04-23 DIAGNOSIS — Z833 Family history of diabetes mellitus: Secondary | ICD-10-CM | POA: Diagnosis not present

## 2022-04-23 DIAGNOSIS — Z79890 Hormone replacement therapy (postmenopausal): Secondary | ICD-10-CM

## 2022-04-23 DIAGNOSIS — I252 Old myocardial infarction: Secondary | ICD-10-CM | POA: Diagnosis not present

## 2022-04-23 DIAGNOSIS — Z823 Family history of stroke: Secondary | ICD-10-CM | POA: Diagnosis not present

## 2022-04-23 DIAGNOSIS — N3001 Acute cystitis with hematuria: Secondary | ICD-10-CM | POA: Diagnosis not present

## 2022-04-23 DIAGNOSIS — Z8249 Family history of ischemic heart disease and other diseases of the circulatory system: Secondary | ICD-10-CM

## 2022-04-23 DIAGNOSIS — I4811 Longstanding persistent atrial fibrillation: Secondary | ICD-10-CM | POA: Diagnosis not present

## 2022-04-23 DIAGNOSIS — Z87891 Personal history of nicotine dependence: Secondary | ICD-10-CM | POA: Diagnosis not present

## 2022-04-23 DIAGNOSIS — Z90710 Acquired absence of both cervix and uterus: Secondary | ICD-10-CM

## 2022-04-23 DIAGNOSIS — B952 Enterococcus as the cause of diseases classified elsewhere: Secondary | ICD-10-CM | POA: Diagnosis present

## 2022-04-23 DIAGNOSIS — I482 Chronic atrial fibrillation, unspecified: Secondary | ICD-10-CM | POA: Diagnosis not present

## 2022-04-23 DIAGNOSIS — N10 Acute tubulo-interstitial nephritis: Secondary | ICD-10-CM | POA: Diagnosis not present

## 2022-04-23 DIAGNOSIS — F03B0 Unspecified dementia, moderate, without behavioral disturbance, psychotic disturbance, mood disturbance, and anxiety: Secondary | ICD-10-CM

## 2022-04-23 DIAGNOSIS — Z7401 Bed confinement status: Secondary | ICD-10-CM | POA: Diagnosis not present

## 2022-04-23 DIAGNOSIS — F0390 Unspecified dementia without behavioral disturbance: Secondary | ICD-10-CM | POA: Diagnosis not present

## 2022-04-23 LAB — URINALYSIS, ROUTINE W REFLEX MICROSCOPIC
Bilirubin Urine: NEGATIVE
Glucose, UA: NEGATIVE mg/dL
Ketones, ur: NEGATIVE mg/dL
Nitrite: NEGATIVE
Protein, ur: 30 mg/dL — AB
RBC / HPF: >50 RBC/hpf (ref 0–5)
Specific Gravity, Urine: 1.02 (ref 1.005–1.030)
WBC, UA: >50 WBC/hpf (ref 0–5)
pH: 5 (ref 5.0–8.0)

## 2022-04-23 LAB — BASIC METABOLIC PANEL
Anion gap: 9 (ref 5–15)
BUN: 20 mg/dL (ref 8–23)
CO2: 23 mmol/L (ref 22–32)
Calcium: 8.7 mg/dL — ABNORMAL LOW (ref 8.9–10.3)
Chloride: 107 mmol/L (ref 98–111)
Creatinine, Ser: 1.27 mg/dL — ABNORMAL HIGH (ref 0.44–1.00)
GFR, Estimated: 42 mL/min — ABNORMAL LOW (ref >60)
Glucose, Bld: 110 mg/dL — ABNORMAL HIGH (ref 70–99)
Potassium: 4.1 mmol/L (ref 3.5–5.1)
Sodium: 139 mmol/L (ref 135–145)

## 2022-04-23 LAB — CBC
HCT: 39.3 % (ref 36.0–46.0)
Hemoglobin: 12.8 g/dL (ref 12.0–15.0)
MCH: 32.6 pg (ref 26.0–34.0)
MCHC: 32.6 g/dL (ref 30.0–36.0)
MCV: 100 fL (ref 80.0–100.0)
Platelets: 185 10*3/uL (ref 150–400)
RBC: 3.93 MIL/uL (ref 3.87–5.11)
RDW: 12.4 % (ref 11.5–15.5)
WBC: 9.2 10*3/uL (ref 4.0–10.5)
nRBC: 0 % (ref 0.0–0.2)

## 2022-04-23 LAB — TSH: TSH: 1.165 u[IU]/mL (ref 0.350–4.500)

## 2022-04-23 MED ORDER — ONDANSETRON HCL 4 MG/2ML IJ SOLN: 4.0000 mg | Freq: Four times a day (QID) | INTRAMUSCULAR | Status: DC | PRN

## 2022-04-23 MED ORDER — NITROGLYCERIN 0.4 MG SL SUBL: 0.4000 mg | SUBLINGUAL_TABLET | SUBLINGUAL | Status: DC | PRN

## 2022-04-23 MED ORDER — SODIUM CHLORIDE 0.9 % IV BOLUS
500.0000 mL | Freq: Once | INTRAVENOUS | Status: AC
  Administered 2022-04-23: 500 mL via INTRAVENOUS

## 2022-04-23 MED ORDER — ASPIRIN 81 MG PO TBEC
81.0000 mg | DELAYED_RELEASE_TABLET | Freq: Every day | ORAL | Status: DC
  Administered 2022-04-24 – 2022-04-30 (×7): 81 mg via ORAL
  Filled 2022-04-23 (×7): qty 1

## 2022-04-23 MED ORDER — MIRABEGRON ER 25 MG PO TB24
25.0000 mg | ORAL_TABLET | Freq: Every day | ORAL | Status: DC
  Administered 2022-04-24 – 2022-04-30 (×7): 25 mg via ORAL
  Filled 2022-04-23 (×7): qty 1

## 2022-04-23 MED ORDER — APIXABAN 5 MG PO TABS
5.0000 mg | ORAL_TABLET | Freq: Two times a day (BID) | ORAL | Status: DC
  Administered 2022-04-23 – 2022-04-30 (×14): 5 mg via ORAL
  Filled 2022-04-23 (×14): qty 1

## 2022-04-23 MED ORDER — SERTRALINE HCL 100 MG PO TABS
100.0000 mg | ORAL_TABLET | Freq: Every day | ORAL | Status: DC
  Administered 2022-04-24 – 2022-04-30 (×7): 100 mg via ORAL
  Filled 2022-04-23 (×7): qty 1

## 2022-04-23 MED ORDER — ATORVASTATIN CALCIUM 80 MG PO TABS
80.0000 mg | ORAL_TABLET | Freq: Every day | ORAL | Status: DC
  Administered 2022-04-24 – 2022-04-30 (×7): 80 mg via ORAL
  Filled 2022-04-23 (×7): qty 1

## 2022-04-23 MED ORDER — LEVOTHYROXINE SODIUM 100 MCG PO TABS
100.0000 ug | ORAL_TABLET | Freq: Every day | ORAL | Status: DC
  Administered 2022-04-24 – 2022-04-30 (×7): 100 ug via ORAL
  Filled 2022-04-23 (×7): qty 1

## 2022-04-23 MED ORDER — SODIUM CHLORIDE 0.9 % IV SOLN: 1.0000 g | INTRAVENOUS | Status: DC

## 2022-04-23 MED ORDER — ONDANSETRON HCL 4 MG PO TABS: 4.0000 mg | ORAL_TABLET | Freq: Four times a day (QID) | ORAL | Status: DC | PRN

## 2022-04-23 MED ORDER — SODIUM CHLORIDE 0.9 % IV SOLN
1.0000 g | Freq: Once | INTRAVENOUS | Status: AC
  Administered 2022-04-23: 1 g via INTRAVENOUS
  Filled 2022-04-23: qty 10

## 2022-04-23 MED ORDER — SODIUM CHLORIDE 0.9 % IV SOLN
2.0000 g | Freq: Three times a day (TID) | INTRAVENOUS | Status: AC
  Administered 2022-04-23 – 2022-04-29 (×20): 2 g via INTRAVENOUS
  Filled 2022-04-23 (×20): qty 2000

## 2022-04-23 MED ORDER — SPIRONOLACTONE 25 MG PO TABS
25.0000 mg | ORAL_TABLET | Freq: Every day | ORAL | Status: DC
  Administered 2022-04-24 – 2022-04-30 (×7): 25 mg via ORAL
  Filled 2022-04-23 (×7): qty 1

## 2022-04-23 MED ORDER — POLYETHYLENE GLYCOL 3350 17 G PO PACK: 17.0000 g | PACK | Freq: Every day | ORAL | Status: DC | PRN

## 2022-04-23 MED ORDER — RISAQUAD PO CAPS
2.0000 | ORAL_CAPSULE | Freq: Three times a day (TID) | ORAL | Status: DC
  Administered 2022-04-23 – 2022-04-30 (×20): 2 via ORAL
  Filled 2022-04-23 (×22): qty 2

## 2022-04-23 NOTE — ED Provider Notes (Signed)
[facility] Provider Note   CSN: [id number] Arrival date & time: 04/23/22  1056     History  Chief Complaint  Patient presents with   Urinary Frequency    Yvonne Bailey is a 82 y.o. female.  The history is provided by the patient. No language interpreter was used.  Urinary Frequency This is a new problem. The problem occurs constantly. The problem has not changed since onset.Nothing aggravates the symptoms. Nothing relieves the symptoms. She has tried nothing for the symptoms.       Home Medications Prior to Admission medications   Medication Sig Start Date End Date Taking? Authorizing Provider  aspirin EC 81 MG tablet Take 1 tablet (81 mg total) by mouth daily. 04/21/17   Imogene Burn, PA-C  atorvastatin (LIPITOR) 40 MG tablet Take 2 tablets (80 mg total) by mouth daily. 01/16/22   Burns, Claudina Lick, MD  ELIQUIS 5 MG TABS tablet TAKE 1 TABLET(5 MG) BY MOUTH TWICE DAILY 01/23/22   Binnie Rail, MD  estradiol (ESTRACE) 0.1 MG/GM vaginal cream Place 1 Applicatorful vaginally once a week. 01/15/21   [provider]  levothyroxine (SYNTHROID) 100 MCG tablet Take 1 tablet (100 mcg total) by mouth daily. 03/19/22   Burns, Claudina Lick, MD  MYRBETRIQ 25 MG TB24 tablet TAKE 1 TABLET(25 MG) BY MOUTH DAILY Patient taking differently: Take 25 mg by mouth daily. 04/26/21   Binnie Rail, MD  nitrofurantoin, macrocrystal-monohydrate, (MACROBID) 100 MG capsule Take 1 capsule (100 mg total) by mouth 2 (two) times daily. 03/21/22   Binnie Rail, MD  nitroGLYCERIN (NITROSTAT) 0.4 MG SL tablet Place 1 tablet (0.4 mg total) under the tongue every 5 (five) minutes as needed for chest pain. 09/08/15   Arbutus Leas, NP  polyethylene glycol (MIRALAX) 17 g packet Take one dose 3 times a day until bowel clear, maximum of 3 consecutive days. Patient taking differently: Take 17 g by mouth daily as needed for mild constipation. 10/30/20   Charlann Lange,  PA-C  sertraline (ZOLOFT) 100 MG tablet Take 1 tablet (100 mg total) by mouth daily. 05/24/21   Binnie Rail, MD  spironolactone (ALDACTONE) 25 MG tablet TAKE 1 TABLET(25 MG) BY MOUTH DAILY Patient taking differently: Take 25 mg by mouth daily. 09/26/21   Binnie Rail, MD      Allergies    Definity [perflutren lipid microsphere] and Pletal [cilostazol]    Review of Systems   Review of Systems  Genitourinary:  Positive for frequency.  All other systems reviewed and are negative.   Physical Exam Updated Vital Signs BP 138/76   Pulse 74   Temp 98.4 F (36.9 C) (Oral)   Resp 13   Ht 5' 7"$  (1.702 m)   Wt 68 kg   SpO2 100%   BMI 23.49 kg/m  Physical Exam Vitals and nursing note reviewed.  Constitutional:      Appearance: She is well-developed.  HENT:     Head: Normocephalic.     Mouth/Throat:     Mouth: Mucous membranes are moist.  Cardiovascular:     Rate and Rhythm: Normal rate.     Pulses: Normal pulses.  Pulmonary:     Effort: Pulmonary effort is normal.  Abdominal:     General: Abdomen is flat. There is no distension.  Musculoskeletal:        General: Normal range of motion.     Cervical back: Normal range of motion.  Skin:    General: Skin is warm.  Neurological:     General: No focal deficit present.     Mental Status: She is alert. She is disoriented.     Cranial Nerves: No cranial nerve deficit.     ED Results / Procedures / Treatments   Labs (all labs ordered are listed, but only abnormal results are displayed) Labs Reviewed  BASIC METABOLIC PANEL - Abnormal; Notable for the following components:      Result Value   Glucose, Bld 110 (*)    Creatinine, Ser 1.27 (*)    Calcium 8.7 (*)    GFR, Estimated 42 (*)    All other components within normal limits  URINALYSIS, ROUTINE W REFLEX MICROSCOPIC - Abnormal; Notable for the following components:   APPearance CLOUDY (*)    Hgb urine dipstick LARGE (*)    Protein, ur 30 (*)    Leukocytes,Ua LARGE  (*)    Bacteria, UA MANY (*)    All other components within normal limits  CBC    EKG None  Radiology No results found.  Procedures Procedures    Medications Ordered in ED Medications - No data to display  ED Course/ Medical Decision Making/ A&P                             Medical Decision Making Pt has had dark urine and uti symptoms.    Amount and/or Complexity of Data Reviewed Independent Historian:     Details: I spoke to pt's son  [name].  He is concerned pt has a uti.  Pt has had vomiting on and off.  Pt reports appetite seems okay  External Data Reviewed: notes.    Details: Primary care notes reviewed  Labs: ordered. Decision-making details documented in ED Course.    Details: Cath ua shows than 50 white blood cells greater than 50 red blood cells positive leukocytes many bacteria.  Complete blood count shows a normal hemoglobin normal white blood cell count chemistries show Radiology: ordered. Discussion of management or test interpretation with external provider(s): Hospitalist consulted for admission  Dr.Zhang will admit   Creatinine of 1.27 this is patient's baseline         Final Clinical Impression(s) / ED Diagnoses Final diagnoses:  Urinary tract infection without hematuria, site unspecified  Moderate dementia, unspecified dementia type, unspecified whether behavioral, psychotic, or mood disturbance or anxiety [facility])    Rx / DC Orders ED Discharge Orders     None         Sidney Ace 04/23/22 1417    Carmin Muskrat, MD 04/23/22 1537

## 2022-04-23 NOTE — ED Triage Notes (Signed)
Pt BIB EMS due to polyuria and foul smelling urine. Pt has dementia and is confused at baseline. Pt is to baseline on arrival. VSS. Axox1

## 2022-04-23 NOTE — ED Notes (Signed)
ED TO INPATIENT HANDOFF REPORT  ED Nurse Name and Phone #: Herbert Spires, [phone]  S Name/Age/Gender Yvonne Bailey 82 y.o. female Room/Bed: 033C/033C  Code Status   Code Status: Full Code  Home/SNF/Other Home Patient oriented to: self and place Is this baseline? Yes   Triage Complete: Triage complete  Chief Complaint UTI (urinary tract infection) [N39.0]  Triage Note Pt BIB EMS due to polyuria and foul smelling urine. Pt has dementia and is confused at baseline. Pt is to baseline on arrival. VSS. Axox1   Allergies Allergies  Allergen Reactions   Definity [Perflutren Lipid Microsphere] Other (See Comments)    Patient experienced back pain with injection   Pletal [Cilostazol] Other (See Comments)    Made pt "feel funny"    Level of Care/Admitting Diagnosis ED Disposition     ED Disposition  Admit   Condition  --   [place]: [place] [[id number]]  Level of Care: Med-Surg [16]  May place patient in observation at [facility] or [place] if equivalent level of care is available:: No  Covid Evaluation: Asymptomatic - no recent exposure (last 10 days) testing not required  Diagnosis: UTI (urinary tract infection) [id number]  Admitting Physician: Lequita Halt [id number]  Attending Physician: Lequita Halt [id number]          B Medical/Surgery History Past Medical History:  Diagnosis Date   Bilateral leg edema 07/19/2015   CAD (coronary artery disease)    a. anterior MI s/p PCI in 1992. b. cath 08/2015 with severe three-vessel CAD turned down for CABG and underwent DESx5 to prox Cx/OM2/D1/oRCA/mRCA   Carotid artery disease ([place])    a. carotid duplex 03/2015 showed 1-39% BICA, normal subclavian arteries, chronically occluded left vertebral, f/u recommended only PRN.   DVT (deep venous thrombosis) ([place])    X1   History of nuclear stress test    a. Myoview 6/17: EF 20-25%, mid anteroseptal, apical anterior, apical septal, apical inferior,  apical lateral and apical scar, no ischemia, intermediate risk   HTN (hypertension)    Hyperlipidemia    Hypokalemia    Hypothyroidism    Ischemic cardiomyopathy    a. Echo 6/17: EF 20-25%, apex appears akinetic, MAC, moderate MR, moderate LAE, mild RVE, trivial PI, PASP 47 mmHg (needs repeat with Definity contrast).  b. Limited echo with Definity contrast 7/17: EF 25-30%, moderate to severe LAE. c. Limited Echo 2018 showed EF 40-45%.   Lacunar infarction ([place]) 12/17/2012   Dr Krista Blue, Neurology    Leukocytosis    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   Longstanding persistent atrial fibrillation (HCC)    MI (myocardial infarction) ([place]) 1992   PAD (peripheral artery disease) (HCC)    Right SFA occlusion, severe disease left CFA and SFA   Prediabetes 07/28/2016   Stage 3 chronic kidney disease ([place])    Stroke ([place])    a. 02/2017 in setting of noncompliance with Eliquis   TIA (transient ischemic attack)    Tricuspid regurgitation    Past Surgical History:  Procedure Laterality Date   APPENDECTOMY     at hysterectomy and USO for fibroids, Dr. Ysidro Evert   CARDIAC CATHETERIZATION  1992   Dr Eustace Quail   CARDIAC CATHETERIZATION N/A 09/06/2015   Procedure: Right/Left Heart Cath and Coronary Angiography;  Surgeon: Burnell Blanks, MD;  Location: [place] [facility];  Service: Cardiovascular;  Laterality: N/A;   CARDIAC CATHETERIZATION N/A 09/07/2015   Procedure: Coronary Stent Intervention;  Surgeon: Harrell Gave  Santina Evans, MD;  Location: [place] [facility];  Service: Cardiovascular;  Laterality: N/A;   COLONOSCOPY     negative; 2008, Dr. Delfin Edis   fracture LLE     '94; pinned   IR ANGIO INTRA EXTRACRAN SEL COM CAROTID INNOMINATE BILAT MOD SED  03/02/2017   IR ANGIO INTRA EXTRACRAN SEL COM CAROTID INNOMINATE BILAT MOD SED  04/23/2018   IR ANGIO VERTEBRAL SEL SUBCLAVIAN INNOMINATE BILAT MOD SED  04/23/2018   IR ANGIO VERTEBRAL SEL VERTEBRAL UNI R MOD SED  03/02/2017   IR RADIOLOGIST  EVAL & MGMT  04/28/2017   IR US GUIDE VASC ACCESS RIGHT  04/23/2018   RADIOLOGY WITH ANESTHESIA N/A 03/02/2017   Procedure: RADIOLOGY WITH ANESTHESIA;  Surgeon: Luanne Bras, MD;  Location: [facility];  Service: Radiology;  Laterality: N/A;   TEE WITHOUT CARDIOVERSION N/A 11/02/2020   Procedure: TRANSESOPHAGEAL ECHOCARDIOGRAM (TEE);  Surgeon: Sanda Klein, MD;  [facility];  Service: Cardiovascular;  Laterality: N/A;   TONSILLECTOMY     TOTAL ABDOMINAL HYSTERECTOMY     & BSO for fibroids     A IV Location/Drains/Wounds Patient Lines/Drains/Airways Status     Active Line/Drains/Airways     Name Placement date Placement time Site Days   Peripheral IV 04/23/22 20 G Anterior;Left;Proximal Forearm 04/23/22  1103  Forearm  less than 1   External Urinary Catheter 04/23/22  1103  --  less than 1   External Urinary Catheter 04/23/22  1103  --  less than 1   Wound / Incision (Open or Dehisced) 09/03/21 Irritant Dermatitis (Moisture Associated Skin Damage) Coccyx Mid 09/03/21  0530  Coccyx  232            Intake/Output Last 24 hours No intake or output data in the 24 hours ending 04/23/22 1440  Labs/Imaging Results for orders placed or performed during the hospital encounter of 04/23/22 (from the past 48 hour(s))  CBC     Status: None   Collection Time: 04/23/22 11:04 AM  Result Value Ref Range   WBC 9.2 4.0 - 10.5 K/uL   RBC 3.93 3.87 - 5.11 MIL/uL   Hemoglobin 12.8 12.0 - 15.0 g/dL   HCT 39.3 36.0 - 46.0 %   MCV 100.0 80.0 - 100.0 fL   MCH 32.6 26.0 - 34.0 pg   MCHC 32.6 30.0 - 36.0 g/dL   RDW 12.4 11.5 - 15.5 %   Platelets 185 150 - 400 K/uL   nRBC 0.0 0.0 - 0.2 %    Comment: Performed at [facility], [place] [address], [place], [place] [id number]  Basic metabolic panel     Status: Abnormal   Collection Time: 04/23/22 11:04 AM  Result Value Ref Range   Sodium 139 135 - 145 mmol/L   Potassium 4.1 3.5 - 5.1 mmol/L   Chloride 107 98 - 111 mmol/L   CO2 23 22 -  32 mmol/L   Glucose, Bld 110 (H) 70 - 99 mg/dL    Comment: Glucose reference range applies only to samples taken after fasting for at least 8 hours.   BUN 20 8 - 23 mg/dL   Creatinine, Ser 1.27 (H) 0.44 - 1.00 mg/dL   Calcium 8.7 (L) 8.9 - 10.3 mg/dL   GFR, Estimated 42 (L) >60 mL/min    Comment: (NOTE) Calculated using the CKD-EPI Creatinine Equation (2021)    Anion gap 9 5 - 15    Comment: Performed at [facility]  [address], [place], [place] [address]  Urinalysis, Routine w reflex microscopic -Urine, Catheterized     Status: Abnormal   Collection Time: 04/23/22 12:59 PM  Result Value Ref Range   Color, Urine YELLOW YELLOW   APPearance CLOUDY (A) CLEAR   Specific Gravity, Urine 1.020 1.005 - 1.030   pH 5.0 5.0 - 8.0   Glucose, UA NEGATIVE NEGATIVE mg/dL   Hgb urine dipstick LARGE (A) NEGATIVE   Bilirubin Urine NEGATIVE NEGATIVE   Ketones, ur NEGATIVE NEGATIVE mg/dL   Protein, ur 30 (A) NEGATIVE mg/dL   Nitrite NEGATIVE NEGATIVE   Leukocytes,Ua LARGE (A) NEGATIVE   RBC / HPF >50 0 - 5 RBC/hpf   WBC, UA >50 0 - 5 WBC/hpf   Bacteria, UA MANY (A) NONE SEEN   Squamous Epithelial / HPF 0-5 0 - 5 /HPF   Mucus PRESENT    Hyaline Casts, UA PRESENT     Comment: Performed at [facility], [address], [place], [place] [address]   No results found.  Pending Labs Unresulted Labs (From admission, onward)     Start     Ordered   04/24/22 [id number]  Basic metabolic panel  Tomorrow morning,   R        04/23/22 1436   04/23/22 1418  TSH  Add-on,   AD        04/23/22 1417            Vitals/Pain Today's Vitals   04/23/22 1215 04/23/22 1300 04/23/22 1315 04/23/22 1330  BP: (!) 148/81 131/73 137/71 138/76  Pulse: 72 77 85 74  Resp: 11 18 16 13  $ Temp:      TempSrc:      SpO2: 99% 98% 98% 100%  Weight:      Height:      PainSc:        Isolation Precautions No active isolations  Medications Medications  cefTRIAXone (ROCEPHIN) 1 g in sodium chloride 0.9 %  100 mL IVPB (1 g Intravenous New Bag/Given 04/23/22 1430)  ampicillin (OMNIPEN) 2 g in sodium chloride 0.9 % 100 mL IVPB (has no administration in time range)  aspirin EC tablet 81 mg (has no administration in time range)  atorvastatin (LIPITOR) tablet 80 mg (has no administration in time range)  nitroGLYCERIN (NITROSTAT) SL tablet 0.4 mg (has no administration in time range)  spironolactone (ALDACTONE) tablet 25 mg (has no administration in time range)  sertraline (ZOLOFT) tablet 100 mg (has no administration in time range)  levothyroxine (SYNTHROID) tablet 100 mcg (has no administration in time range)  polyethylene glycol (MIRALAX / GLYCOLAX) packet 17 g (has no administration in time range)  mirabegron ER (MYRBETRIQ) tablet 25 mg (has no administration in time range)  apixaban (ELIQUIS) tablet 5 mg (has no administration in time range)  ondansetron (ZOFRAN) tablet 4 mg (has no administration in time range)    Or  ondansetron (ZOFRAN) injection 4 mg (has no administration in time range)  acidophilus (RISAQUAD) capsule 2 capsule (has no administration in time range)  sodium chloride 0.9 % bolus 500 mL (500 mLs Intravenous New Bag/Given 04/23/22 1427)    Mobility non-ambulatory     Focused Assessments Renal Assessment Handoff:  Hemodialysis Schedule: Not dialysis  Last Hemodialysis date and time: N/A   Restricted appendage: N/A    R Recommendations: See Admitting Provider Note  Report given to:   Additional Notes: Pt has hx of poly nephritis and is here for same sx

## 2022-04-23 NOTE — Progress Notes (Signed)
This nurse attempted to call daughter Roxy Cedar to ask admission questionnaire. Nurse called phone number on file, no answer, unable to leave voice message.

## 2022-04-23 NOTE — H&P (Signed)
History and Physical    Yvonne Bailey [id number] DOB: 04/27/1940 DOA: 04/23/2022  PCP: Binnie Rail, MD (Confirm with patient/family/NH records and if not entered, this has to be entered at [facility] point of entry) Patient coming from: Home  I have personally briefly reviewed patient's old medical records in [place]  Chief Complaint: Patient is confused  HPI: Yvonne Bailey is a 82 y.o. female with medical history significant of advanced dementia, CAD, PAF on Eliquis, recurrent UTIs, HTN, HLD, CKD stage II, hypothyroidism, brought in by family member for evaluation of worsening of confusion, generalized weakness and smelly urine.  Patient is confused unable to answer any questions, or history provided by son over the phone.  Son reported that the patient started to get frequent UTIs since June 2023.  Last 2 months she was treated to episode with nitrofurantoin for Enterobacter UTI.  Starting 3 to 4 days ago, patient again developed smelly urine and increasingly patient became more confused and lethargic unwilling to get out of bed, compared to her baseline patient is active and alert talkative at her baseline.  Denies any fever at home.  Denies any nauseous vomiting or diarrhea at home  ED Course: Afebrile, no tachycardia nonhypotensive.  WBC 9.2.  UA showed signs of UTI with microscopic hematuria.  Patient was given 1 dose of ceftriaxone.  Review of Systems: Unable to perform, patient is confused  Past Medical History:  Diagnosis Date   Bilateral leg edema 07/19/2015   CAD (coronary artery disease)    a. anterior MI s/p PCI in 1992. b. cath 08/2015 with severe three-vessel CAD turned down for CABG and underwent DESx5 to prox Cx/OM2/D1/oRCA/mRCA   Carotid artery disease ([place])    a. carotid duplex 03/2015 showed 1-39% BICA, normal subclavian arteries, chronically occluded left vertebral, f/u recommended only PRN.   DVT (deep venous thrombosis) ([place])    X1   History of  nuclear stress test    a. Myoview 6/17: EF 20-25%, mid anteroseptal, apical anterior, apical septal, apical inferior, apical lateral and apical scar, no ischemia, intermediate risk   HTN (hypertension)    Hyperlipidemia    Hypokalemia    Hypothyroidism    Ischemic cardiomyopathy    a. Echo 6/17: EF 20-25%, apex appears akinetic, MAC, moderate MR, moderate LAE, mild RVE, trivial PI, PASP 47 mmHg (needs repeat with Definity contrast).  b. Limited echo with Definity contrast 7/17: EF 25-30%, moderate to severe LAE. c. Limited Echo 2018 showed EF 40-45%.   Lacunar infarction ([place]) 12/17/2012   Dr Krista Blue, Neurology    Leukocytosis    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   Longstanding persistent atrial fibrillation (HCC)    MI (myocardial infarction) ([place]) 1992   PAD (peripheral artery disease) (HCC)    Right SFA occlusion, severe disease left CFA and SFA   Prediabetes 07/28/2016   Stage 3 chronic kidney disease ([place])    Stroke ([place])    a. 02/2017 in setting of noncompliance with Eliquis   TIA (transient ischemic attack)    Tricuspid regurgitation     Past Surgical History:  Procedure Laterality Date   APPENDECTOMY     at hysterectomy and USO for fibroids, Dr. Ysidro Evert   CARDIAC CATHETERIZATION  1992   Dr Eustace Quail   CARDIAC CATHETERIZATION N/A 09/06/2015   Procedure: Right/Left Heart Cath and Coronary Angiography;  Surgeon: Burnell Blanks, MD;  Location: [place] [facility];  Service: Cardiovascular;  Laterality: N/A;   CARDIAC CATHETERIZATION N/A  09/07/2015   Procedure: Coronary Stent Intervention;  Surgeon: Burnell Blanks, MD;  Location: [facility];  Service: Cardiovascular;  Laterality: N/A;   COLONOSCOPY     negative; 2008, Dr. Delfin Edis   fracture LLE     '94; pinned   IR ANGIO INTRA EXTRACRAN SEL COM CAROTID INNOMINATE BILAT MOD SED  03/02/2017   IR ANGIO INTRA EXTRACRAN SEL COM CAROTID INNOMINATE BILAT MOD SED  04/23/2018   IR ANGIO VERTEBRAL SEL  SUBCLAVIAN INNOMINATE BILAT MOD SED  04/23/2018   IR ANGIO VERTEBRAL SEL VERTEBRAL UNI R MOD SED  03/02/2017   IR RADIOLOGIST EVAL & MGMT  04/28/2017   IR US GUIDE VASC ACCESS RIGHT  04/23/2018   RADIOLOGY WITH ANESTHESIA N/A 03/02/2017   Procedure: RADIOLOGY WITH ANESTHESIA;  Surgeon: Luanne Bras, MD;  Location: [facility];  Service: Radiology;  Laterality: N/A;   TEE WITHOUT CARDIOVERSION N/A 11/02/2020   Procedure: TRANSESOPHAGEAL ECHOCARDIOGRAM (TEE);  Surgeon: Sanda Klein, MD;  [facility];  Service: Cardiovascular;  Laterality: N/A;   TONSILLECTOMY     TOTAL ABDOMINAL HYSTERECTOMY     & BSO for fibroids     reports that she quit smoking about 32 years ago. Her smoking use included cigarettes. She has never used smokeless tobacco. She reports that she does not drink alcohol and does not use drugs.  Allergies  Allergen Reactions   Definity [Perflutren Lipid Microsphere] Other (See Comments)    Patient experienced back pain with injection   Pletal [Cilostazol] Other (See Comments)    Made pt "feel funny"    Family History  Problem Relation Age of Onset   Diabetes Mother    Hypertension Mother    Stroke Brother        ?> 30   Coronary artery disease Brother        stent in 58s   Cancer Neg Hx     Prior to Admission medications   Medication Sig Start Date End Date Taking? Authorizing Provider  aspirin EC 81 MG tablet Take 1 tablet (81 mg total) by mouth daily. 04/21/17   Imogene Burn, PA-C  atorvastatin (LIPITOR) 40 MG tablet Take 2 tablets (80 mg total) by mouth daily. 01/16/22   Burns, Claudina Lick, MD  ELIQUIS 5 MG TABS tablet TAKE 1 TABLET(5 MG) BY MOUTH TWICE DAILY 01/23/22   Binnie Rail, MD  estradiol (ESTRACE) 0.1 MG/GM vaginal cream Place 1 Applicatorful vaginally once a week. 01/15/21   [provider]  levothyroxine (SYNTHROID) 100 MCG tablet Take 1 tablet (100 mcg total) by mouth daily. 03/19/22   Burns, Claudina Lick, MD  MYRBETRIQ 25 MG TB24 tablet  TAKE 1 TABLET(25 MG) BY MOUTH DAILY Patient taking differently: Take 25 mg by mouth daily. 04/26/21   Binnie Rail, MD  nitrofurantoin, macrocrystal-monohydrate, (MACROBID) 100 MG capsule Take 1 capsule (100 mg total) by mouth 2 (two) times daily. 03/21/22   Binnie Rail, MD  nitroGLYCERIN (NITROSTAT) 0.4 MG SL tablet Place 1 tablet (0.4 mg total) under the tongue every 5 (five) minutes as needed for chest pain. 09/08/15   Arbutus Leas, NP  polyethylene glycol (MIRALAX) 17 g packet Take one dose 3 times a day until bowel clear, maximum of 3 consecutive days. Patient taking differently: Take 17 g by mouth daily as needed for mild constipation. 10/30/20   Charlann Lange, PA-C  sertraline (ZOLOFT) 100 MG tablet Take 1 tablet (100 mg total) by mouth daily. 05/24/21  Binnie Rail, MD  spironolactone (ALDACTONE) 25 MG tablet TAKE 1 TABLET(25 MG) BY MOUTH DAILY Patient taking differently: Take 25 mg by mouth daily. 09/26/21   Binnie Rail, MD    Physical Exam: Vitals:   04/23/22 1215 04/23/22 1300 04/23/22 1315 04/23/22 1330  BP: (!) 148/81 131/73 137/71 138/76  Pulse: 72 77 85 74  Resp: 11 18 16 13  $ Temp:      TempSrc:      SpO2: 99% 98% 98% 100%  Weight:      Height:        Constitutional: NAD, calm, comfortable Vitals:   04/23/22 1215 04/23/22 1300 04/23/22 1315 04/23/22 1330  BP: (!) 148/81 131/73 137/71 138/76  Pulse: 72 77 85 74  Resp: 11 18 16 13  $ Temp:      TempSrc:      SpO2: 99% 98% 98% 100%  Weight:      Height:       Eyes: PERRL, lids and conjunctivae normal ENMT: Mucous membranes are moist. Posterior pharynx clear of any exudate or lesions.Normal dentition.  Neck: normal, supple, no masses, no thyromegaly Respiratory: clear to auscultation bilaterally, no wheezing, no crackles. Normal respiratory effort. No accessory muscle use.  Cardiovascular: Regular rate and rhythm, no murmurs / rubs / gallops. No extremity edema. 2+ pedal pulses. No carotid bruits.  Abdomen: no  tenderness, no masses palpated. No hepatosplenomegaly. Bowel sounds positive.  Musculoskeletal: no clubbing / cyanosis. No joint deformity upper and lower extremities. Good ROM, no contractures. Normal muscle tone.  Skin: no rashes, lesions, ulcers. No induration Neurologic: No facial droops, moving all limbs, following some commands Psychiatric: Oriented to herself, confused about time and place    Labs on Admission: I have personally reviewed following labs and imaging studies  CBC: Recent Labs  Lab 04/23/22 1104  WBC 9.2  HGB 12.8  HCT 39.3  MCV 100.0  PLT [id number]   Basic Metabolic Panel: Recent Labs  Lab 04/23/22 1104  NA 139  K 4.1  CL 107  CO2 23  GLUCOSE 110*  BUN 20  CREATININE 1.27*  CALCIUM 8.7*   GFR: Estimated Creatinine Clearance: 33.8 mL/min (A) (by C-G formula based on SCr of 1.27 mg/dL (H)). Liver Function Tests: No results for input(s): "AST", "ALT", "ALKPHOS", "BILITOT", "PROT", "ALBUMIN" in the last 168 hours. No results for input(s): "LIPASE", "AMYLASE" in the last 168 hours. No results for input(s): "AMMONIA" in the last 168 hours. Coagulation Profile: No results for input(s): "INR", "PROTIME" in the last 168 hours. Cardiac Enzymes: No results for input(s): "CKTOTAL", "CKMB", "CKMBINDEX", "TROPONINI" in the last 168 hours. BNP (last 3 results) No results for input(s): "PROBNP" in the last 8760 hours. HbA1C: No results for input(s): "HGBA1C" in the last 72 hours. CBG: No results for input(s): "GLUCAP" in the last 168 hours. Lipid Profile: No results for input(s): "CHOL", "HDL", "LDLCALC", "TRIG", "CHOLHDL", "LDLDIRECT" in the last 72 hours. Thyroid Function Tests: No results for input(s): "TSH", "T4TOTAL", "FREET4", "T3FREE", "THYROIDAB" in the last 72 hours. Anemia Panel: No results for input(s): "VITAMINB12", "FOLATE", "FERRITIN", "TIBC", "IRON", "RETICCTPCT" in the last 72 hours. Urine analysis:    Component Value Date/Time   COLORURINE  YELLOW 04/23/2022 1259   APPEARANCEUR CLOUDY (A) 04/23/2022 1259   APPEARANCEUR Cloudy (A) 03/27/2017 1332   LABSPEC 1.020 04/23/2022 1259   PHURINE 5.0 04/23/2022 1259   GLUCOSEU NEGATIVE 04/23/2022 1259   GLUCOSEU NEGATIVE 03/18/2022 1502   HGBUR LARGE (A) 04/23/2022 1259  HGBUR negative 10/26/2007 1057   BILIRUBINUR NEGATIVE 04/23/2022 1259   BILIRUBINUR negative 12/25/2021 1655   BILIRUBINUR Negative 03/27/2017 1332   KETONESUR NEGATIVE 04/23/2022 1259   PROTEINUR 30 (A) 04/23/2022 1259   UROBILINOGEN 0.2 03/18/2022 1502   NITRITE NEGATIVE 04/23/2022 1259   LEUKOCYTESUR LARGE (A) 04/23/2022 1259    Radiological Exams on Admission: No results found.  EKG: None  Assessment/Plan Principal Problem:   UTI (urinary tract infection) Active Problems:   Essential hypertension   Atrial fibrillation (HCC)   Acute cystitis with hematuria   Acute metabolic encephalopathy   Fall  (please populate well all problems here in Problem List. (For example, if patient is on BP meds at home and you resume or decide to hold them, it is a problem that needs to be her. Same for CAD, COPD, HLD and so on)  Acute metabolic encephalopathy, GCS=9 -Secondary to complicated UTI -Went to urine culture past 2 months continues show pansensitive Enterobacter faecalis, not sure why nitrofurantoin did not work with her urine repeatedly grow Enterobacter.  Change antibiotics to ampicillin while waiting for urine culture this time. -No history of fall however given the patient is on Eliquis, will check CT head x 1.  UTI complicated with microscopic hematuria -Reportedly patient had nausea but no vomiting at home, will check renal ultrasound to rule out pyelonephritis  PAF -Sinus rhythm, continue Eliquis  CKD stage II -Euvolemic, creatinine level stable  HTN -Stable, Continue home BP meds  Hypothyroidism -Check TSH, continue Synthroid  Chronic ambulation dysfunction/deconditioning -Secondary to  UTI.  PT evaluation tomorrow  DVT prophylaxis: Eliquis Code Status: Full Code Family Communication: Son over phone Disposition Plan: Expect less than 2 midnight hospital stay Consults called: None Admission status: MedSurg observation   Lequita Halt MD [facility] Pager [phone]  04/23/2022, 2:36 PM

## 2022-04-24 DIAGNOSIS — I2510 Atherosclerotic heart disease of native coronary artery without angina pectoris: Secondary | ICD-10-CM | POA: Diagnosis present

## 2022-04-24 DIAGNOSIS — Z833 Family history of diabetes mellitus: Secondary | ICD-10-CM | POA: Diagnosis not present

## 2022-04-24 DIAGNOSIS — R4182 Altered mental status, unspecified: Secondary | ICD-10-CM | POA: Diagnosis present

## 2022-04-24 DIAGNOSIS — I129 Hypertensive chronic kidney disease with stage 1 through stage 4 chronic kidney disease, or unspecified chronic kidney disease: Secondary | ICD-10-CM | POA: Diagnosis present

## 2022-04-24 DIAGNOSIS — E785 Hyperlipidemia, unspecified: Secondary | ICD-10-CM | POA: Diagnosis present

## 2022-04-24 DIAGNOSIS — N3001 Acute cystitis with hematuria: Secondary | ICD-10-CM | POA: Diagnosis present

## 2022-04-24 DIAGNOSIS — N390 Urinary tract infection, site not specified: Secondary | ICD-10-CM

## 2022-04-24 DIAGNOSIS — I255 Ischemic cardiomyopathy: Secondary | ICD-10-CM | POA: Diagnosis present

## 2022-04-24 DIAGNOSIS — Z90710 Acquired absence of both cervix and uterus: Secondary | ICD-10-CM | POA: Diagnosis not present

## 2022-04-24 DIAGNOSIS — G9341 Metabolic encephalopathy: Secondary | ICD-10-CM | POA: Diagnosis present

## 2022-04-24 DIAGNOSIS — I482 Chronic atrial fibrillation, unspecified: Secondary | ICD-10-CM | POA: Diagnosis not present

## 2022-04-24 DIAGNOSIS — E876 Hypokalemia: Secondary | ICD-10-CM | POA: Diagnosis present

## 2022-04-24 DIAGNOSIS — Z87891 Personal history of nicotine dependence: Secondary | ICD-10-CM | POA: Diagnosis not present

## 2022-04-24 DIAGNOSIS — I10 Essential (primary) hypertension: Secondary | ICD-10-CM | POA: Diagnosis not present

## 2022-04-24 DIAGNOSIS — I252 Old myocardial infarction: Secondary | ICD-10-CM | POA: Diagnosis not present

## 2022-04-24 DIAGNOSIS — Z79890 Hormone replacement therapy (postmenopausal): Secondary | ICD-10-CM | POA: Diagnosis not present

## 2022-04-24 DIAGNOSIS — N182 Chronic kidney disease, stage 2 (mild): Secondary | ICD-10-CM | POA: Diagnosis present

## 2022-04-24 DIAGNOSIS — E039 Hypothyroidism, unspecified: Secondary | ICD-10-CM | POA: Diagnosis present

## 2022-04-24 DIAGNOSIS — Z7982 Long term (current) use of aspirin: Secondary | ICD-10-CM | POA: Diagnosis not present

## 2022-04-24 DIAGNOSIS — I4811 Longstanding persistent atrial fibrillation: Secondary | ICD-10-CM | POA: Diagnosis present

## 2022-04-24 DIAGNOSIS — Z751 Person awaiting admission to adequate facility elsewhere: Secondary | ICD-10-CM | POA: Diagnosis not present

## 2022-04-24 DIAGNOSIS — Z8673 Personal history of transient ischemic attack (TIA), and cerebral infarction without residual deficits: Secondary | ICD-10-CM | POA: Diagnosis not present

## 2022-04-24 DIAGNOSIS — Z86718 Personal history of other venous thrombosis and embolism: Secondary | ICD-10-CM | POA: Diagnosis not present

## 2022-04-24 DIAGNOSIS — Z79899 Other long term (current) drug therapy: Secondary | ICD-10-CM | POA: Diagnosis not present

## 2022-04-24 DIAGNOSIS — Z7901 Long term (current) use of anticoagulants: Secondary | ICD-10-CM | POA: Diagnosis not present

## 2022-04-24 DIAGNOSIS — Z8249 Family history of ischemic heart disease and other diseases of the circulatory system: Secondary | ICD-10-CM | POA: Diagnosis not present

## 2022-04-24 DIAGNOSIS — F03B0 Unspecified dementia, moderate, without behavioral disturbance, psychotic disturbance, mood disturbance, and anxiety: Secondary | ICD-10-CM | POA: Diagnosis present

## 2022-04-24 DIAGNOSIS — Z823 Family history of stroke: Secondary | ICD-10-CM | POA: Diagnosis not present

## 2022-04-24 LAB — BASIC METABOLIC PANEL WITH GFR
Anion gap: 10 (ref 5–15)
BUN: 17 mg/dL (ref 8–23)
CO2: 24 mmol/L (ref 22–32)
Calcium: 8.3 mg/dL — ABNORMAL LOW (ref 8.9–10.3)
Chloride: 105 mmol/L (ref 98–111)
Creatinine, Ser: 1.02 mg/dL — ABNORMAL HIGH (ref 0.44–1.00)
GFR, Estimated: 55 mL/min — ABNORMAL LOW
Glucose, Bld: 88 mg/dL (ref 70–99)
Potassium: 3.3 mmol/L — ABNORMAL LOW (ref 3.5–5.1)
Sodium: 139 mmol/L (ref 135–145)

## 2022-04-24 MED ORDER — POTASSIUM CHLORIDE CRYS ER 20 MEQ PO TBCR
40.0000 meq | EXTENDED_RELEASE_TABLET | Freq: Once | ORAL | Status: AC
  Administered 2022-04-24: 40 meq via ORAL
  Filled 2022-04-24: qty 2

## 2022-04-24 MED ORDER — ORAL CARE MOUTH RINSE: 15.0000 mL | OROMUCOSAL | Status: DC | PRN

## 2022-04-24 NOTE — Evaluation (Signed)
Physical Therapy Evaluation Patient Details Name: Yvonne Bailey MRN: [id number] DOB: 08/06/1940 Today's Date: 04/24/2022  History of Present Illness  82 yo female presents to ED on 2/13 with suspected UTI. PMHx: dementia, UTIs, CAD, s/p PCI, DVT, HTN, icm, lacunar infarct, MI, PAD, CKD3, stroke.  Clinical Impression   Pt presents with generalized weakness, impaired balance with posterior bias, max difficulty mobilizing to EOB, and impaired activity tolerance vs pt-reported baseline. Pt to benefit from acute PT to address deficits. Pt sat EOB, overall requiring mod-max assist for EOB and unable to come to standing even with multiple attempts and max PT assist. At this point PT anticipates pt will need ST-SNF placement, hopefully pt progresses quickly and return home with family depending on how much support they are able to offer, would likely need 24/7 assist at d/c. PT to progress mobility as tolerated, and will continue to follow acutely.         Recommendations for follow up therapy are one component of a multi-disciplinary discharge planning process, led by the attending physician.  Recommendations may be updated based on patient status, additional functional criteria and insurance authorization.  Follow Up Recommendations Skilled nursing-short term rehab (<3 hours/day) (vs home with HHPT and 24/7 support from family, no family at bedside to determine possible assist at home) Can patient physically be transported by private vehicle: No    Assistance Recommended at Discharge Frequent or constant Supervision/Assistance  Patient can return home with the following  Two people to help with walking and/or transfers;A lot of help with bathing/dressing/bathroom;Direct supervision/assist for medications management;Assistance with cooking/housework;Direct supervision/assist for financial management    Equipment Recommendations None recommended by PT  Recommendations for Other Services        Functional Status Assessment Patient has had a recent decline in their functional status and demonstrates the ability to make significant improvements in function in a reasonable and predictable amount of time.     Precautions / Restrictions Precautions Precautions: Fall Restrictions Weight Bearing Restrictions: No      Mobility  Bed Mobility Overal bed mobility: Needs Assistance Bed Mobility: Supine to Sit, Sit to Supine     Supine to sit: Mod assist Sit to supine: Max assist   General bed mobility comments: assist for trunk and LE management, boost up in bed upon return to supine.    Transfers Overall transfer level: Needs assistance   Transfers: Bed to chair/wheelchair/BSC            Lateral/Scoot Transfers: Mod assist General transfer comment: assist for scooting hips towards R for bodyweight support and truncal/hip translation. Attempted standing x3, unable to clear buttocks even with max PT support suspect partially due to pt gaurding    Ambulation/Gait               General Gait Details: unable  Stairs            Wheelchair Mobility    Modified Rankin (Stroke Patients Only)       Balance Overall balance assessment: Needs assistance Sitting-balance support: No upper extremity supported, Feet supported Sitting balance-Leahy Scale: Fair Sitting balance - Comments: preference for posterior bias Postural control: Posterior lean     Standing balance comment: unable                             Pertinent Vitals/Pain Pain Assessment Pain Assessment: No/denies pain    Home Living Family/patient expects to be discharged to::  Private residence Living Arrangements: Children;Other relatives (son, granddaughter) Available Help at Discharge: Family;Available PRN/intermittently;Available 24 hours/day Type of Home: House Home Access: Stairs to enter Entrance Stairs-Rails: Right Entrance Stairs-Number of Steps: 6   Home Layout: One  level Home Equipment: Conservation officer, nature (2 wheels);BSC/3in1;Shower seat;Cane - single point      Prior Function Prior Level of Function : Independent/Modified Independent;Needs assist               ADLs Comments: pt states her son cleans and cooks, granddaughter helps her wash up either sponge bath or in the shower     Hand Dominance   Dominant Hand: Right    Extremity/Trunk Assessment   Upper Extremity Assessment Upper Extremity Assessment: Defer to OT evaluation    Lower Extremity Assessment Lower Extremity Assessment: Generalized weakness    Cervical / Trunk Assessment Cervical / Trunk Assessment: Kyphotic  Communication   Communication: HOH  Cognition Arousal/Alertness: Awake/alert Behavior During Therapy: WFL for tasks assessed/performed Overall Cognitive Status: History of cognitive impairments - at baseline                                 General Comments: history of dementia, oriented to self and location. Laughing inappropriately throughout session, but pleasant        General Comments      Exercises     Assessment/Plan    PT Assessment Patient needs continued PT services  PT Problem List Decreased strength;Decreased activity tolerance;Decreased mobility;Decreased balance;Decreased safety awareness;Decreased cognition;Decreased knowledge of use of DME       PT Treatment Interventions DME instruction;Therapeutic activities;Gait training;Therapeutic exercise;Patient/family education;Balance training;Stair training;Functional mobility training;Neuromuscular re-education    PT Goals (Current goals can be found in the Care Plan section)  Acute Rehab PT Goals Patient Stated Goal: home PT Goal Formulation: With patient Time For Goal Achievement: 05/08/22 Potential to Achieve Goals: Good    Frequency Min 3X/week     Co-evaluation               AM-PAC PT "6 Clicks" Mobility  Outcome Measure Help needed turning from your back to  your side while in a flat bed without using bedrails?: A Lot Help needed moving from lying on your back to sitting on the side of a flat bed without using bedrails?: A Lot Help needed moving to and from a bed to a chair (including a wheelchair)?: Total Help needed standing up from a chair using your arms (e.g., wheelchair or bedside chair)?: Total Help needed to walk in hospital room?: Total Help needed climbing 3-5 steps with a railing? : Total 6 Click Score: 8    End of Session   Activity Tolerance: Patient limited by fatigue;Other (comment) (pt-reported weakness) Patient left: in bed;with call bell/phone within reach;with bed alarm set Nurse Communication: Mobility status PT Visit Diagnosis: Other abnormalities of gait and mobility (R26.89);Difficulty in walking, not elsewhere classified (R26.2)    Time: [id number] PT Time Calculation (min) (ACUTE ONLY): 22 min   Charges:   PT Evaluation $PT Eval Low Complexity: 1 Low         Keerstin Bjelland S, PT DPT Acute Rehabilitation Services Pager [phone]  Office [phone]   Dolores E Ruffin Pyo 04/24/2022, 9:02 AM

## 2022-04-24 NOTE — Progress Notes (Signed)
PROGRESS NOTE    Yvonne Bailey  [id number] DOB: [birth year] DOA: 04/23/2022 PCP: Binnie Rail, MD   Brief Narrative: 82 year old with past medical history significant for advanced dementia, CAD, PAF on Eliquis, recurrent UTI, hypertension, hyperlipidemia, CKD stage II, hypothyroidism presented to the ED due to worsening confusion, generalized weakness and foul smell urine.  Patient has had recurring UTI since June 2023.  Last 2 months she was treated with nitrofurantoin for Enterobacter UTI.  3 days ago she became more confused lethargic and with a smelly urine.  Admitted with recurring UTI  Assessment & Plan:   Principal Problem:   UTI (urinary tract infection) Active Problems:   Essential hypertension   Atrial fibrillation (HCC)   Acute cystitis with hematuria   Acute metabolic encephalopathy   Fall   1-Acute metabolic encephalopathy In the setting of infection, treat for UTI She is alert and answering questions today. Delirium precaution  UTI: Associated microscopic hematuria - UA; more than 50 red blood cell, white blood cell 21-50 -Prior urine culture with Enterococcus faecalis -Continue with IV Ampicillin.  -Renal US; Exam is limited due to patient's combative nature. Increased echogenicity of renal parenchyma is noted bilaterally suggesting medical renal disease. No hydronephrosis or renal obstruction is noted.  PAF: Continue with Eliquis As rhythm  CKD stage II:  Cr baseline 1.2 Monitor.   Hypertension: On spironolactone. Monitor renal function.   Hypothyroidism: Continue with Synthroid TSH; 1.1  Ambulation  dysfunction deconditioning: PT eval  Hypokalemia; replete orally.     Estimated body mass index is 25.38 kg/m as calculated from the following:   Height as of this encounter: 5' 7"$  (1.702 m).   Weight as of this encounter: 73.5 kg.   DVT prophylaxis: on eliquis Code Status: Full code Family Communication: no family at bedside.   Disposition Plan:  Status is: Inpatient Remains inpatient appropriate because: management of UTI    Consultants:  none  Procedures:  None  Antimicrobials:  IV ampicillin   Subjective: She is alert, pleasantly confuse.  Denies pain   Objective: Vitals:   04/23/22 2021 04/23/22 2310 04/24/22 0313 04/24/22 0723  BP: (!) 143/60 (!) 158/90 (!) 154/86 (!) 153/72  Pulse: 76 65 86 76  Resp: 16 16 18 16  $ Temp: 98.6 F (37 C) 98.2 F (36.8 C) 98.5 F (36.9 C) 97.7 F (36.5 C)  TempSrc: Oral Oral Oral   SpO2: 99% 100% 100% 98%  Weight:      Height:        Intake/Output Summary (Last 24 hours) at 04/24/2022 1408 Last data filed at 04/24/2022 0600 Gross per 24 hour  Intake 1262.56 ml  Output 400 ml  Net 862.56 ml   Filed Weights   04/23/22 1101 04/23/22 1658  Weight: 68 kg 73.5 kg    Examination:  General exam: Appears calm and comfortable  Respiratory system: Clear to auscultation. Respiratory effort normal. Cardiovascular system: S1 & S2 heard, RRR.  Gastrointestinal system: Abdomen is nondistended, soft and nontender. No organomegaly or masses felt. Normal bowel sounds heard. Central nervous system: Alert Extremities: Symmetric 5 x 5 power.    Data Reviewed: I have personally reviewed following labs and imaging studies  CBC: Recent Labs  Lab 04/23/22 1104  WBC 9.2  HGB 12.8  HCT 39.3  MCV 100.0  PLT [id number]   Basic Metabolic Panel: Recent Labs  Lab 04/23/22 1104 04/24/22 0337  NA 139 139  K 4.1 3.3*  CL 107 105  CO2 23 24  GLUCOSE 110* 88  BUN 20 17  CREATININE 1.27* 1.02*  CALCIUM 8.7* 8.3*   GFR: Estimated Creatinine Clearance: 42.1 mL/min (A) (by C-G formula based on SCr of 1.02 mg/dL (H)). Liver Function Tests: No results for input(s): "AST", "ALT", "ALKPHOS", "BILITOT", "PROT", "ALBUMIN" in the last 168 hours. No results for input(s): "LIPASE", "AMYLASE" in the last 168 hours. No results for input(s): "AMMONIA" in the last 168  hours. Coagulation Profile: No results for input(s): "INR", "PROTIME" in the last 168 hours. Cardiac Enzymes: No results for input(s): "CKTOTAL", "CKMB", "CKMBINDEX", "TROPONINI" in the last 168 hours. BNP (last 3 results) No results for input(s): "PROBNP" in the last 8760 hours. HbA1C: No results for input(s): "HGBA1C" in the last 72 hours. CBG: No results for input(s): "GLUCAP" in the last 168 hours. Lipid Profile: No results for input(s): "CHOL", "HDL", "LDLCALC", "TRIG", "CHOLHDL", "LDLDIRECT" in the last 72 hours. Thyroid Function Tests: Recent Labs    04/23/22 1104  TSH 1.165   Anemia Panel: No results for input(s): "VITAMINB12", "FOLATE", "FERRITIN", "TIBC", "IRON", "RETICCTPCT" in the last 72 hours. Sepsis Labs: No results for input(s): "PROCALCITON", "LATICACIDVEN" in the last 168 hours.  No results found for this or any previous visit (from the past 240 hour(s)).       Radiology Studies: CT HEAD WO CONTRAST (5MM)  Result Date: 04/23/2022 CLINICAL DATA:  Mental status change, unknown cause. EXAM: CT HEAD WITHOUT CONTRAST TECHNIQUE: Contiguous axial images were obtained from the base of the skull through the vertex without intravenous contrast. RADIATION DOSE REDUCTION: This exam was performed according to the departmental dose-optimization program which includes automated exposure control, adjustment of the mA and/or kV according to patient size and/or use of iterative reconstruction technique. COMPARISON:  Head CT 02/28/2022. FINDINGS: Brain: No acute intracranial hemorrhage. 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 Ex vacuo dilatation of the ventricles. No extra-axial collection. Basilar cisterns are patent. Vascular: No hyperdense vessel. Skull: No calvarial fracture or suspicious bone lesion. Skull base is unremarkable. Sinuses/Orbits: Unremarkable. Other: None. IMPRESSION: 1. No acute intracranial  abnormality. 2. 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 Electronically Signed   By: Emmit Alexanders M.D.   On: 04/23/2022 16:42   US RENAL  Result Date: 04/23/2022 CLINICAL DATA:  Pyelonephritis. EXAM: RENAL / URINARY TRACT ULTRASOUND COMPLETE COMPARISON:  February 13, 2021. FINDINGS: Right Kidney: Renal measurements: 9.8 x 5.1 x 4.7 cm = volume: 122 mL. Increased echogenicity of renal parenchyma is noted suggesting medical renal disease. 4.1 cm simple cyst is noted for which no further follow-up is required. No mass or hydronephrosis visualized. Left Kidney: Renal measurements: 9.3 x 5.9 x 4.6 cm = volume: 131 mL. Increased echogenicity of renal parenchyma is noted suggesting medical renal disease. No mass or hydronephrosis visualized. Bladder: Appears normal for degree of bladder distention. Other: Exam is somewhat limited due to combative nature of patient. IMPRESSION: Exam is limited due to patient's combative nature. Increased echogenicity of renal parenchyma is noted bilaterally suggesting medical renal disease. No hydronephrosis or renal obstruction is noted. Electronically Signed   By: Marijo Conception M.D.   On: 04/23/2022 15:02        Scheduled Meds:  acidophilus  2 capsule Oral TID   apixaban  5 mg Oral BID   aspirin EC  81 mg Oral Daily   atorvastatin  80 mg Oral Daily   levothyroxine  100 mcg  Oral Daily   mirabegron ER  25 mg Oral Daily   sertraline  100 mg Oral Daily   spironolactone  25 mg Oral Daily   Continuous Infusions:  ampicillin (OMNIPEN) IV 2 g (04/24/22 0510)     LOS: 0 days    Time spent: 35 minutes.     Elmarie Shiley, MD [facility]   If 7PM-7AM, please contact night-coverage [email or web address]  04/24/2022, 2:08 PM

## 2022-04-24 NOTE — Evaluation (Signed)
Occupational Therapy Evaluation Patient Details Name: Yvonne Bailey MRN: [id number] DOB: [birth year] Today's Date: 04/24/2022   History of Present Illness 82 yo female presents to ED on 2/13 with suspected UTI. PMHx: dementia, UTIs, CAD, s/p PCI, DVT, HTN, icm, lacunar infarct, MI, PAD, CKD3, stroke.   Clinical Impression   Yvonne Bailey was evaluated s/p the above admission list. Per chart, pt's family assists as needed with ADLs. Upon evaluation she was limited by impaired cognition, fear of falling, generalized weakness, posterior bias in sitting and standing and decreased activity tolerance. Overall she required mod A for bed mobility, once sitting she progressed to close min G for balance. Stedy frame utilized to deter fear of falling, pt stood with max A and maintained standing with mod A and BUE support. Due to the deficits listed below, she requires min A - total A for ADLs. Pt will benefit from continued acute OT services. Recommend d/c to SNF for continued therapy. .    Recommendations for follow up therapy are one component of a multi-disciplinary discharge planning process, led by the attending physician.  Recommendations may be updated based on patient status, additional functional criteria and insurance authorization.   Follow Up Recommendations  Skilled nursing-short term rehab (<3 hours/day)     Assistance Recommended at Discharge Frequent or constant Supervision/Assistance  Patient can return home with the following A lot of help with walking and/or transfers;A lot of help with bathing/dressing/bathroom;Assistance with cooking/housework;Assistance with feeding;Direct supervision/assist for medications management;Direct supervision/assist for financial management;Assist for transportation;Help with stairs or ramp for entrance    Functional Status Assessment  Patient has had a recent decline in their functional status and demonstrates the ability to make significant improvements  in function in a reasonable and predictable amount of time.  Equipment Recommendations  None recommended by OT (defer)    Recommendations for Other Services       Precautions / Restrictions Precautions Precautions: Fall Restrictions Weight Bearing Restrictions: No      Mobility Bed Mobility Overal bed mobility: Needs Assistance Bed Mobility: Supine to Sit, Sit to Supine     Supine to sit: Mod assist Sit to supine: Mod assist        Transfers Overall transfer level: Needs assistance Equipment used: Ambulation equipment used Transfers: Sit to/from Stand Sit to Stand: Max assist           General transfer comment: stood with stedy frame, maximal cues needed to prevent posterior pushing. Once standing in the frame, pt was mod A for posterior bias      Balance Overall balance assessment: Needs assistance Sitting-balance support: No upper extremity supported, Feet supported Sitting balance-Leahy Scale: Fair Sitting balance - Comments: Once sitting, pt min G   Standing balance support: Bilateral upper extremity supported, During functional activity Standing balance-Leahy Scale: Poor Standing balance comment: mod A for posterior bias in stedy frame                           ADL either performed or assessed with clinical judgement   ADL Overall ADL's : Needs assistance/impaired Eating/Feeding: Set up;Sitting   Grooming: Set up;Sitting   Upper Body Bathing: Moderate assistance;Sitting   Lower Body Bathing: Maximal assistance;Sit to/from stand   Upper Body Dressing : Minimal assistance;Sitting   Lower Body Dressing: Maximal assistance;Sit to/from stand   Toilet Transfer: Total assistance   Toileting- Clothing Manipulation and Hygiene: Total assistance       Functional mobility during  ADLs: Maximal assistance General ADL Comments: limited by cognition, fear of falling, weakness, and posterior bias. Pt stood with stedy frame and max A      Vision Baseline Vision/History: 0 No visual deficits Vision Assessment?: No apparent visual deficits     Perception Perception Perception Tested?: No   Praxis Praxis Praxis tested?: Not tested    Pertinent Vitals/Pain Pain Assessment Pain Assessment: No/denies pain     Hand Dominance Right   Extremity/Trunk Assessment Upper Extremity Assessment Upper Extremity Assessment: Generalized weakness   Lower Extremity Assessment Lower Extremity Assessment: Defer to PT evaluation   Cervical / Trunk Assessment Cervical / Trunk Assessment: Kyphotic   Communication Communication Communication: HOH   Cognition Arousal/Alertness: Awake/alert Behavior During Therapy: WFL for tasks assessed/performed Overall Cognitive Status: History of cognitive impairments - at baseline                                 General Comments: history of dementia, oriented to self and location. pleasantly confused, follows most simple commands     General Comments  VSS on RA    Exercises     Shoulder Instructions      Home Living Family/patient expects to be discharged to:: Private residence Living Arrangements: Children;Other relatives Available Help at Discharge: Family;Available PRN/intermittently;Available 24 hours/day Type of Home: House Home Access: Stairs to enter [employer] of Steps: 6 Entrance Stairs-Rails: Right Home Layout: One level     Bathroom Shower/Tub: Occupational psychologist: Handicapped height Bathroom Accessibility: Yes   Home Equipment: Conservation officer, nature (2 wheels);BSC/3in1;Shower seat;Cane - single point          Prior Functioning/Environment Prior Level of Function : Independent/Modified Independent;Needs assist             Mobility Comments: RW for amb ADLs Comments: pt states her son cleans and cooks, granddaughter helps her wash up either sponge bath or in the shower        OT Problem List: Decreased  strength;Decreased range of motion;Decreased activity tolerance;Impaired balance (sitting and/or standing);Decreased cognition;Decreased safety awareness      OT Treatment/Interventions: Self-care/ADL training;Therapeutic exercise;DME and/or AE instruction;Therapeutic activities;Balance training    OT Goals(Current goals can be found in the care plan section) Acute Rehab OT Goals Patient Stated Goal: did not state OT Goal Formulation: With patient Time For Goal Achievement: 05/08/22 Potential to Achieve Goals: Good ADL Goals Pt Will Perform Grooming: with set-up;sitting Pt Will Perform Upper Body Dressing: with set-up;sitting Pt Will Perform Lower Body Dressing: with mod assist;sit to/from stand Pt Will Transfer to Toilet: with mod assist;stand pivot transfer;bedside commode Additional ADL Goal #1: Pt will sequence ADL task with supervision A only  OT Frequency: Min 2X/week    Co-evaluation              AM-PAC OT "6 Clicks" Daily Activity     Outcome Measure Help from another person eating meals?: A Little Help from another person taking care of personal grooming?: A Little Help from another person toileting, which includes using toliet, bedpan, or urinal?: Total Help from another person bathing (including washing, rinsing, drying)?: A Lot Help from another person to put on and taking off regular upper body clothing?: A Little Help from another person to put on and taking off regular lower body clothing?: A Lot 6 Click Score: 14   End of Session Equipment Utilized During Treatment: Other (comment);Gait belt (stedy) Nurse Communication:  Mobility status  Activity Tolerance: Patient tolerated treatment well Patient left: in bed;with bed alarm set;with call bell/phone within reach  OT Visit Diagnosis: Unsteadiness on feet (R26.81);Other abnormalities of gait and mobility (R26.89);Muscle weakness (generalized) (M62.81)                Time: [id number] OT Time Calculation (min):  19 min Charges:  OT General Charges $OT Visit: 1 Visit OT Evaluation $OT Eval Moderate Complexity: 1 Mod  Shade Flood, OTR/L Acute Rehabilitation Services Office [place] Communication Preferred   Elliot Cousin 04/24/2022, 1:53 PM

## 2022-04-25 DIAGNOSIS — I482 Chronic atrial fibrillation, unspecified: Secondary | ICD-10-CM

## 2022-04-25 DIAGNOSIS — I10 Essential (primary) hypertension: Secondary | ICD-10-CM

## 2022-04-25 DIAGNOSIS — G9341 Metabolic encephalopathy: Secondary | ICD-10-CM

## 2022-04-25 DIAGNOSIS — E038 Other specified hypothyroidism: Secondary | ICD-10-CM

## 2022-04-25 DIAGNOSIS — W19XXXD Unspecified fall, subsequent encounter: Secondary | ICD-10-CM

## 2022-04-25 DIAGNOSIS — N3001 Acute cystitis with hematuria: Secondary | ICD-10-CM | POA: Diagnosis not present

## 2022-04-25 LAB — CBC
HCT: 39 % (ref 36.0–46.0)
Hemoglobin: 12.8 g/dL (ref 12.0–15.0)
MCH: 32.7 pg (ref 26.0–34.0)
MCHC: 32.8 g/dL (ref 30.0–36.0)
MCV: 99.5 fL (ref 80.0–100.0)
Platelets: 182 10*3/uL (ref 150–400)
RBC: 3.92 MIL/uL (ref 3.87–5.11)
RDW: 12.4 % (ref 11.5–15.5)
WBC: 7.1 10*3/uL (ref 4.0–10.5)
nRBC: 0 % (ref 0.0–0.2)

## 2022-04-25 LAB — BASIC METABOLIC PANEL WITH GFR
Anion gap: 9 (ref 5–15)
BUN: 16 mg/dL (ref 8–23)
CO2: 24 mmol/L (ref 22–32)
Calcium: 8.4 mg/dL — ABNORMAL LOW (ref 8.9–10.3)
Chloride: 106 mmol/L (ref 98–111)
Creatinine, Ser: 1.01 mg/dL — ABNORMAL HIGH (ref 0.44–1.00)
GFR, Estimated: 56 mL/min — ABNORMAL LOW
Glucose, Bld: 84 mg/dL (ref 70–99)
Potassium: 4.6 mmol/L (ref 3.5–5.1)
Sodium: 139 mmol/L (ref 135–145)

## 2022-04-25 NOTE — Assessment & Plan Note (Signed)
The patient is currently normotensive without antihypertensive.Monitor.

## 2022-04-25 NOTE — Assessment & Plan Note (Signed)
The patient's heart rate is currently well controlled without rate controlling medication. However she is on apixaban as outpatient.

## 2022-04-25 NOTE — Assessment & Plan Note (Signed)
Resolved. The patient is awake, alert, and oriented x 3 today.

## 2022-04-25 NOTE — Progress Notes (Signed)
Mobility Specialist - Progress Note   04/25/22 1206  Mobility  Activity Refused mobility   Pt refused mobility stating that she only want to do therapy in the morning. Will follow up if time permits.   Franki Monte  Mobility Specialist Please contact via Solicitor or Rehab office at [phone]

## 2022-04-25 NOTE — Assessment & Plan Note (Signed)
The patient has been continued on levothyroxine 100 mcg daily.

## 2022-04-25 NOTE — Progress Notes (Signed)
PROGRESS NOTE  Yvonne Bailey [id number] DOB: [birth year] DOA: 04/23/2022 PCP: Yvonne Rail, MD  Brief History   82 year old with past medical history significant for advanced dementia, CAD, PAF on Eliquis, recurrent UTI, hypertension, hyperlipidemia, CKD stage II, hypothyroidism presented to the ED due to worsening confusion, generalized weakness and foul smell urine.   Patient has had recurring UTI since June 2023.  Last 2 months she was treated with nitrofurantoin for Enterobacter UTI.  3 days ago she became more confused lethargic and with a smelly urine.   Admitted with recurring UTI. She was placed on IV ampicillin to which the E. Faecalis that grew from the patient's urine on 03/18/2022. Her lethargy has resolved and her encephalopathy is clearing.  Consultants  None  Procedures  None  Antibiotics   Anti-infectives (From admission, onward)    Start     Dose/Rate Route Frequency Ordered Stop   04/24/22 1000  cefTRIAXone (ROCEPHIN) 1 g in sodium chloride 0.9 % 100 mL IVPB  Status:  Discontinued        1 g 200 mL/hr over 30 Minutes Intravenous Every 24 hours 04/23/22 1415 04/23/22 1433   04/23/22 1445  ampicillin (OMNIPEN) 2 g in sodium chloride 0.9 % 100 mL IVPB        2 g 300 mL/hr over 20 Minutes Intravenous Every 8 hours 04/23/22 1433     04/23/22 1415  cefTRIAXone (ROCEPHIN) 1 g in sodium chloride 0.9 % 100 mL IVPB        1 g 200 mL/hr over 30 Minutes Intravenous  Once 04/23/22 1403 04/23/22 1502        Subjective  The patient is resting comfortably. No new complaints.   Objective   Vitals:  Vitals:   04/25/22 0614 04/25/22 0717  BP: (!) 162/89 (!) 147/89  Pulse: 74 83  Resp: 17 16  Temp: 97.7 F (36.5 C) 97.8 F (36.6 C)  SpO2: 100% 96%    Exam:  Constitutional:  Appears calm and comfortable Respiratory:  CTA bilaterally, no w/r/r.  Respiratory effort normal. No retractions or accessory muscle use Cardiovascular:  RRR, no m/r/g No LE  extremity edema   Normal pedal pulses Abdomen:  Abdomen appears normal; no tenderness or masses No hernias No HSM Musculoskeletal:  Digits/nails BUE: no clubbing, cyanosis, petechiae, infection exam of joints, bones, muscles of at least one of following: head/neck, RUE, LUE, RLE, LLE   strength and tone normal, no atrophy, no abnormal movements No tenderness, masses Normal ROM, no contractures  gait and station Skin:  No rashes, lesions, ulcers palpation of skin: no induration or nodules Neurologic:  CN 2-12 intact Sensation all 4 extremities intact Psychiatric:  Mental status Mood, affect appropriate Orientation to person, place, time    I have personally reviewed the following:   Today's Data   Vitals:   04/25/22 0614 04/25/22 0717  BP: (!) 162/89 (!) 147/89  Pulse: 74 83  Resp: 17 16  Temp: 97.7 F (36.5 C) 97.8 F (36.6 C)  SpO2: 100% 96%     Lab Data      Latest Ref Rng & Units 04/25/2022    5:37 AM 04/24/2022    3:37 AM 04/23/2022   11:04 AM  BMP  Glucose 70 - 99 mg/dL 84  88  110   BUN 8 - 23 mg/dL 16  17  20   $ Creatinine 0.44 - 1.00 mg/dL 1.01  1.02  1.27   Sodium 135 - 145 mmol/L 139  139  139   Potassium 3.5 - 5.1 mmol/L 4.6  3.3  4.1   Chloride 98 - 111 mmol/L 106  105  107   CO2 22 - 32 mmol/L 24  24  23   $ Calcium 8.9 - 10.3 mg/dL 8.4  8.3  8.7    CBC    Component Value Date/Time   WBC 7.1 04/25/2022 0537   RBC 3.92 04/25/2022 0537   HGB 12.8 04/25/2022 0537   HGB 13.2 08/03/2019 1409   HCT 39.0 04/25/2022 0537   HCT 40.4 08/03/2019 1409   PLT 182 04/25/2022 0537   PLT 253 08/03/2019 1409   MCV 99.5 04/25/2022 0537   MCV 98 (H) 08/03/2019 1409   MCH 32.7 04/25/2022 0537   MCHC 32.8 04/25/2022 0537   RDW 12.4 04/25/2022 0537   RDW 12.1 08/03/2019 1409   LYMPHSABS 2.0 03/18/2022 1502   MONOABS 0.7 03/18/2022 1502   EOSABS 0.3 03/18/2022 1502   BASOSABS 0.1 03/18/2022 1502     Micro Data   Results for orders placed or performed  in visit on 03/18/22  Urine Culture     Status: Abnormal   Collection Time: 03/18/22  3:02 PM   Specimen: Urine  Result Value Ref Range Status   Source: URINE  Final   Status: FINAL  Final   Isolate 1: Enterococcus faecalis (A)  Final    Comment: Greater than 100,000 CFU/mL of Enterococcus faecalis      Susceptibility   Enterococcus faecalis - URINE CULTURE POSITIVE 1    AMPICILLIN <=2 Sensitive     VANCOMYCIN 1 Sensitive     NITROFURANTOIN* <=16 Sensitive      * Legend: S = Susceptible  I = Intermediate R = Resistant  NS = Not susceptible * = Not tested  NR = Not reported **NN = See antimicrobic comments     Scheduled Meds:  acidophilus  2 capsule Oral TID   apixaban  5 mg Oral BID   aspirin EC  81 mg Oral Daily   atorvastatin  80 mg Oral Daily   levothyroxine  100 mcg Oral Daily   mirabegron ER  25 mg Oral Daily   sertraline  100 mg Oral Daily   spironolactone  25 mg Oral Daily   Continuous Infusions:  ampicillin (OMNIPEN) IV 2 g (04/25/22 1430)    Principal Problem:   UTI (urinary tract infection) Active Problems:   Hypothyroidism   Essential hypertension   Atrial fibrillation (HCC)   Acute cystitis with hematuria   Acute metabolic encephalopathy   Fall   LOS: 1 day   A & P  Acute cystitis with hematuria Urine culture was positive for E. Faecalis on 03/18/2022. No culture was performed on the patient's urine on this admission. She is receiving ampicillin to which the organism cultured in January was sensitive.  Hypothyroidism The patient has been continued on levothyroxine 100 mcg daily.  Fall Fall at home.  Essential hypertension The patient is currently normotensive without antihypertensive.Monitor.  Atrial fibrillation (Yvonne Bailey) The patient's heart rate is currently well controlled without rate controlling medication. However she is on apixaban as outpatient.  Acute metabolic encephalopathy Resolved. The patient is awake, alert, and oriented x 3  today. Pleasantly confused.  DVT prophylaxis: Eliquis Code Status: Full Code Family Communication: None available Disposition Plan: SNF    Yvonne Stoklosa, DO [facility] Direct contact: see [email or web address]  7PM-7AM contact night coverage as above 04/25/2022, 4:05 PM  LOS: 1 day

## 2022-04-25 NOTE — NC FL2 (Signed)
[place] LEVEL OF CARE FORM     IDENTIFICATION  Patient Name: Yvonne Bailey Birthdate: [birth year] Sex: female Admission Date (Current Location): 04/23/2022  [facility] and [place] Number:  Herbalist and Address:  [facility] [place] [address], [place],  [address]      Provider Number: [id number]  Attending Physician Name and Address:  Karie Kirks, DO  Relative Name and Phone Number:       Current Level of Care: Hospital Recommended Level of Care: [place] Prior Approval Number:    Date Approved/Denied:   PASRR Number: [id number]  Discharge Plan: SNF    Current Diagnoses: Patient Active Problem List   Diagnosis Date Noted   UTI (urinary tract infection) 04/23/2022   Fall 03/17/2022   Hypomagnesemia 11/19/2021   Hypokalemia 11/17/2021   Catheter-associated urinary tract infection ([place]) 123XX123   Acute metabolic encephalopathy 123XX123   Acute cystitis with hematuria 10/17/2020   Rash 08/29/2020   TIA (transient ischemic attack) 06/10/2020   Anxiety and depression 03/28/2020   Overactive bladder 03/28/2020   Lack of motivation [id number]   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   Aphasia as late effect of cerebrovascular accident (CVA)    Acute ischemic cerebrovascular accident (CVA) involving left middle cerebral artery territory [facility])    Coronary artery disease involving native coronary artery without angina pectoris    Stage 3 chronic kidney disease ([place])    Prediabetes 07/28/2016   DOE (dyspnea on exertion) 07/09/2016   Gait instability 07/09/2016   Cardiomyopathy, ischemic    Chronic systolic heart failure ([place]) 08/21/2015   Bilateral leg edema 07/19/2015   Atrial fibrillation (HCC) 07/19/2015   Urinary urgency 03/30/2015   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   Small vessel disease, cerebrovascular 12/17/2012   CAROTID BRUIT, RIGHT 08/10/2008   Peripheral vascular disease ([place])  06/04/2007   Diverticulosis of large intestine 06/04/2007   Hypothyroidism 02/24/2007   Dyslipidemia 02/24/2007   Essential hypertension 02/24/2007   Acute thromboembolism of deep veins of lower extremity ([place]) 01/21/2007    Orientation RESPIRATION BLADDER Height & Weight     Self, Place  Normal Continent, External catheter Weight: 162 lb 0.6 oz (73.5 kg) Height:  5' 7"$  (170.2 cm)  BEHAVIORAL SYMPTOMS/MOOD NEUROLOGICAL BOWEL NUTRITION STATUS      Continent Diet  AMBULATORY STATUS COMMUNICATION OF NEEDS Skin   Extensive Assist Verbally Normal                       Personal Care Assistance Level of Assistance  Bathing, Feeding, Dressing Bathing Assistance: Limited assistance Feeding assistance: Limited assistance Dressing Assistance: Limited assistance     Functional Limitations Info  Sight, Hearing, Speech Sight Info: Adequate Hearing Info: Adequate Speech Info: Adequate    SPECIAL CARE FACTORS FREQUENCY  PT (By licensed PT), OT (By licensed OT)     PT Frequency: 5x weekly OT Frequency: 5x weekly            Contractures Contractures Info: Not present    Additional Factors Info  Code Status, Allergies Code Status Info: Full Code Allergies Info: Definity (Perflutren Lipid Microsphere)  Pletal (Cilostazol)           Current Medications (04/25/2022):  This is the current hospital active medication list Current Facility-Administered Medications  Medication Dose Route Frequency Provider Last Rate Last Admin   acidophilus (RISAQUAD) capsule 2 capsule  2 capsule Oral TID Lequita Halt, MD   2  capsule at 04/25/22 0927   ampicillin (OMNIPEN) 2 g in sodium chloride 0.9 % 100 mL IVPB  2 g Intravenous Q8H Wynetta Fines T, MD 300 mL/hr at 04/25/22 0518 2 g at 04/25/22 0518   apixaban (ELIQUIS) tablet 5 mg  5 mg Oral BID Wynetta Fines T, MD   5 mg at 04/25/22 [id number]   aspirin EC tablet 81 mg  81 mg Oral Daily Wynetta Fines T, MD   81 mg at 04/25/22 [id number]   atorvastatin  (LIPITOR) tablet 80 mg  80 mg Oral Daily Wynetta Fines T, MD   80 mg at 04/25/22 [id number]   levothyroxine (SYNTHROID) tablet 100 mcg  100 mcg Oral Daily Wynetta Fines T, MD   100 mcg at 04/25/22 0517   mirabegron ER (MYRBETRIQ) tablet 25 mg  25 mg Oral Daily Wynetta Fines T, MD   25 mg at 04/25/22 [id number]   nitroGLYCERIN (NITROSTAT) SL tablet 0.4 mg  0.4 mg Sublingual Q5 min PRN Lequita Halt, MD       ondansetron [facility]) tablet 4 mg  4 mg Oral Q6H PRN Wynetta Fines T, MD       Or   ondansetron [facility]) injection 4 mg  4 mg Intravenous Q6H PRN Lequita Halt, MD       Oral care mouth rinse  15 mL Mouth Rinse PRN Wynetta Fines T, MD       polyethylene glycol (MIRALAX / GLYCOLAX) packet 17 g  17 g Oral Daily PRN Wynetta Fines T, MD       sertraline (ZOLOFT) tablet 100 mg  100 mg Oral Daily Wynetta Fines T, MD   100 mg at 04/25/22 [id number]   spironolactone (ALDACTONE) tablet 25 mg  25 mg Oral Daily Lequita Halt, MD   25 mg at 04/25/22 [id number]     Discharge Medications: Please see discharge summary for a list of discharge medications.  Relevant Imaging Results:  Relevant Lab Results:   Additional Information SSN: [id number]  Archie Endo, LCSW

## 2022-04-25 NOTE — Hospital Course (Addendum)
82 year old with past medical history significant for advanced dementia, CAD, PAF on Eliquis, recurrent UTI, hypertension, hyperlipidemia, CKD stage II, hypothyroidism presented to the ED due to worsening confusion, generalized weakness and foul smell urine.   Patient has had recurring UTI since June 2023.  Last 2 months she was treated with nitrofurantoin for Enterobacter UTI.  3 days ago she became more confused lethargic and with a smelly urine.   Admitted with recurring UTI. She was placed on IV ampicillin to which the E. Faecalis that grew from the patient's urine on 03/18/2022. Her lethargy has resolved and her encephalopathy is clearing.  She is awaiting SNF placement.

## 2022-04-25 NOTE — Assessment & Plan Note (Signed)
Fall at home.

## 2022-04-25 NOTE — Plan of Care (Signed)

## 2022-04-25 NOTE — TOC Initial Note (Signed)
Transition of Care [facility]) - Initial/Assessment Note    Patient Details  Name: Yvonne Bailey MRN: [id number] Date of Birth: 07-Sep-1940  [facility] of [facility]) CM/SW Contact:    Archie Endo, LCSW Phone Number: 04/25/2022, 10:50 AM  Clinical Narrative:                 CSW spoke with patient's son Jenny Reichmann who states his mother lives with her granddaughter Delana Meyer. [name] states patient has been to [facility] in the past. Jenny Reichmann agreeable for CSW to initiate search for a short term rehab bed. CSW and Jenny Reichmann briefly discussed how to obtain a purewick for home use.  Expected Discharge Plan: [place]     Patient Goals and CMS Choice            Expected Discharge Plan and Services    Prior Living Arrangements/Services   Lives with:: Relatives (Granddaughter Roxy Cedar) Patient language and need for interpreter reviewed:: Yes Do you feel safe going back to the place where you live?: Yes      Need for Family Participation in Patient Care: Yes (Comment) Care giver support system in place?: Yes (comment)   Criminal Activity/Legal Involvement Pertinent to Current Situation/Hospitalization: No - Comment as needed  Activities of Daily Living   ADL Screening (condition at time of admission) Patient's cognitive ability adequate to safely complete daily activities?: No Is the patient deaf or have difficulty hearing?: No Does the patient have difficulty seeing, even when wearing glasses/contacts?: No Does the patient have difficulty concentrating, remembering, or making decisions?: Yes Patient able to express need for assistance with ADLs?: Yes Does the patient have difficulty dressing or bathing?: Yes Independently performs ADLs?: No Does the patient have difficulty walking or climbing stairs?: Yes Weakness of Legs: Both Weakness of Arms/Hands: None  Permission Sought/Granted   Emotional Assessment Appearance:: Appears stated age Attitude/Demeanor/Rapport:  Engaged   Orientation: : Oriented to Self, Oriented to Place Alcohol / Substance Use: Never Used Psych Involvement: No (comment)  Admission diagnosis:  UTI (urinary tract infection) [N39.0] Urinary tract infection without hematuria, site unspecified [N39.0] Moderate dementia, unspecified dementia type, unspecified whether behavioral, psychotic, or mood disturbance or anxiety ([place]) [F03.B0] Patient Active Problem List   Diagnosis Date Noted   UTI (urinary tract infection) 04/23/2022   Fall 03/17/2022   Hypomagnesemia 11/19/2021   Hypokalemia 11/17/2021   Catheter-associated urinary tract infection ([place]) 123XX123   Acute metabolic encephalopathy 123XX123   Acute cystitis with hematuria 10/17/2020   Rash 08/29/2020   TIA (transient ischemic attack) 06/10/2020   Anxiety and depression 03/28/2020   Overactive bladder 03/28/2020   Lack of motivation [id number]   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   Aphasia as late effect of cerebrovascular accident (CVA)    Acute ischemic cerebrovascular accident (CVA) involving left middle cerebral artery territory [facility])    Coronary artery disease involving native coronary artery without angina pectoris    Stage 3 chronic kidney disease ([place])    Prediabetes 07/28/2016   DOE (dyspnea on exertion) 07/09/2016   Gait instability 07/09/2016   Cardiomyopathy, ischemic    Chronic systolic heart failure ([place]) 08/21/2015   Bilateral leg edema 07/19/2015   Atrial fibrillation ([place]) 07/19/2015   Urinary urgency 03/30/2015   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 [address] Phone: [phone] Fax: [phone]  [facility] [place], [place] [address] [place] [address] Phone: [phone] Fax: [phone]     Social Determinants of Health (SDOH) Social History: [place]: No Food Insecurity (12/17/2021)  Housing: Low Risk  (12/17/2021)  Transportation Needs: No Transportation Needs (12/17/2021)  Utilities: Not At Risk (12/17/2021)  Alcohol Screen: Low Risk  (12/17/2021)  Depression (PHQ2-9): Low Risk  (03/18/2022)  Financial Resource Strain: Low Risk  (12/17/2021)  Physical Activity: Inactive (12/17/2021)  Social Connections: Unknown (12/17/2021)  Stress: No Stress Concern Present (12/17/2021)  Tobacco Use: Medium Risk (04/23/2022)   SDOH Interventions:     Readmission Risk Interventions    11/19/2021   10:32 AM  Readmission Risk Prevention Plan  Transportation Screening Complete  PCP or Specialist Appt within 5-7 Days Complete  Home Care Screening Complete  Medication Review (RN CM) Complete

## 2022-04-25 NOTE — Progress Notes (Signed)
Mobility Specialist - Progress Note   04/25/22 1527  Mobility  Activity Dangled on edge of bed  Level of Assistance Maximum assist, patient does 25-49%  Assistive Device None  Activity Response Tolerated poorly  Mobility Referral Yes  $Mobility charge 1 Mobility   Pt was received in bed and agreeable to mobility. Pt was MaxA to EOB. X3 failed attempt to stand despite Max effort. Pt was returned to supine with all needs met.  Franki Monte  Mobility Specialist Please contact via Solicitor or Rehab office at [phone]

## 2022-04-25 NOTE — Assessment & Plan Note (Signed)
Urine culture was positive for E. Faecalis on 03/18/2022. No culture was performed on the patient's urine on this admission. She is receiving ampicillin to which the organism cultured in January was sensitive.

## 2022-04-26 DIAGNOSIS — I482 Chronic atrial fibrillation, unspecified: Secondary | ICD-10-CM | POA: Diagnosis not present

## 2022-04-26 DIAGNOSIS — E039 Hypothyroidism, unspecified: Secondary | ICD-10-CM

## 2022-04-26 DIAGNOSIS — G9341 Metabolic encephalopathy: Secondary | ICD-10-CM | POA: Diagnosis not present

## 2022-04-26 DIAGNOSIS — I10 Essential (primary) hypertension: Secondary | ICD-10-CM | POA: Diagnosis not present

## 2022-04-26 DIAGNOSIS — N3001 Acute cystitis with hematuria: Secondary | ICD-10-CM | POA: Diagnosis not present

## 2022-04-26 LAB — CBC WITH DIFFERENTIAL/PLATELET
Abs Immature Granulocytes: 0.02 10*3/uL (ref 0.00–0.07)
Basophils Absolute: 0 10*3/uL (ref 0.0–0.1)
Basophils Relative: 1 %
Eosinophils Absolute: 0.5 10*3/uL (ref 0.0–0.5)
Eosinophils Relative: 7 %
HCT: 38.4 % (ref 36.0–46.0)
Hemoglobin: 12.9 g/dL (ref 12.0–15.0)
Immature Granulocytes: 0 %
Lymphocytes Relative: 36 %
Lymphs Abs: 2.3 10*3/uL (ref 0.7–4.0)
MCH: 32.9 pg (ref 26.0–34.0)
MCHC: 33.6 g/dL (ref 30.0–36.0)
MCV: 98 fL (ref 80.0–100.0)
Monocytes Absolute: 0.6 10*3/uL (ref 0.1–1.0)
Monocytes Relative: 9 %
Neutro Abs: 2.9 10*3/uL (ref 1.7–7.7)
Neutrophils Relative %: 47 %
Platelets: 182 10*3/uL (ref 150–400)
RBC: 3.92 MIL/uL (ref 3.87–5.11)
RDW: 12.2 % (ref 11.5–15.5)
WBC: 6.4 10*3/uL (ref 4.0–10.5)
nRBC: 0 % (ref 0.0–0.2)

## 2022-04-26 LAB — BASIC METABOLIC PANEL
Anion gap: 8 (ref 5–15)
BUN: 17 mg/dL (ref 8–23)
CO2: 25 mmol/L (ref 22–32)
Calcium: 8.3 mg/dL — ABNORMAL LOW (ref 8.9–10.3)
Chloride: 107 mmol/L (ref 98–111)
Creatinine, Ser: 1.02 mg/dL — ABNORMAL HIGH (ref 0.44–1.00)
GFR, Estimated: 55 mL/min — ABNORMAL LOW (ref >60)
Glucose, Bld: 88 mg/dL (ref 70–99)
Potassium: 3.4 mmol/L — ABNORMAL LOW (ref 3.5–5.1)
Sodium: 140 mmol/L (ref 135–145)

## 2022-04-26 NOTE — Progress Notes (Signed)
Mobility Specialist - Progress Note   04/26/22 1509  Mobility  Activity Transferred from chair to bed  Level of Assistance +2 (takes two people)  Assistive Device Front wheel walker  Activity Response Tolerated well  Mobility Referral Yes  $Mobility charge 1 Mobility   Pt was received in chair needing assistance back to bed. Pt was ModA +2 to stand from chair and pivot to bed. Pt with posterior lean and needing assistance to stand straight. Pt was left in bed with all needs met. RN present.   Yvonne Bailey  Mobility Specialist Please contact via Solicitor or Rehab office at [phone]

## 2022-04-26 NOTE — Progress Notes (Signed)
PROGRESS NOTE  Yvonne Bailey [id number] DOB: Jan 18, 1941 DOA: 04/23/2022 PCP: Binnie Rail, MD  Brief History   82 year old with past medical history significant for advanced dementia, CAD, PAF on Eliquis, recurrent UTI, hypertension, hyperlipidemia, CKD stage II, hypothyroidism presented to the ED due to worsening confusion, generalized weakness and foul smell urine.   Patient has had recurring UTI since June 2023.  Last 2 months she was treated with nitrofurantoin for Enterobacter UTI.  3 days ago she became more confused lethargic and with a smelly urine.   Admitted with recurring UTI. She was placed on IV ampicillin to which the E. Faecalis that grew from the patient's urine on 03/18/2022.   On the morning of 04/26/2022 the patient remained somewhat confused. She thought that she was at church. She did not know the year or date.   Consultants  None  Procedures  None  Antibiotics   Anti-infectives (From admission, onward)    Start     Dose/Rate Route Frequency Ordered Stop   04/24/22 1000  cefTRIAXone (ROCEPHIN) 1 g in sodium chloride 0.9 % 100 mL IVPB  Status:  Discontinued        1 g 200 mL/hr over 30 Minutes Intravenous Every 24 hours 04/23/22 1415 04/23/22 1433   04/23/22 1445  ampicillin (OMNIPEN) 2 g in sodium chloride 0.9 % 100 mL IVPB        2 g 300 mL/hr over 20 Minutes Intravenous Every 8 hours 04/23/22 1433     04/23/22 1415  cefTRIAXone (ROCEPHIN) 1 g in sodium chloride 0.9 % 100 mL IVPB        1 g 200 mL/hr over 30 Minutes Intravenous  Once 04/23/22 1403 04/23/22 1502        Subjective  The patient is resting comfortably. No new complaints. She is confused.  Objective   Vitals:  Vitals:   04/25/22 0614 04/25/22 0717  BP: (!) 162/89 (!) 147/89  Pulse: 74 83  Resp: 17 16  Temp: 97.7 F (36.5 C) 97.8 F (36.6 C)  SpO2: 100% 96%    Exam:  Constitutional:  Appears calm and comfortable. She is pleasantly confused. Respiratory:  CTA  bilaterally, no w/r/r.  Respiratory effort normal. No retractions or accessory muscle use Cardiovascular:  RRR, no m/r/g No LE extremity edema   Normal pedal pulses Abdomen:  Abdomen appears normal; no tenderness or masses No hernias No HSM Musculoskeletal:  Digits/nails BUE: no clubbing, cyanosis, petechiae, infection exam of joints, bones, muscles of at least one of following: head/neck, RUE, LUE, RLE, LLE   strength and tone normal, no atrophy, no abnormal movements No tenderness, masses Normal ROM, no contractures  gait and station Skin:  No rashes, lesions, ulcers palpation of skin: no induration or nodules Neurologic:  CN 2-12 intact Sensation all 4 extremities intact Psychiatric:  Mental status Mood, affect appropriate Orientation to person, place, time    I have personally reviewed the following:   Today's Data   Vitals:   04/26/22 0536 04/26/22 0808  BP: (!) 149/71 (!) 155/71  Pulse: 85 69  Resp: 18 15  Temp: 98.7 F (37.1 C) 97.8 F (36.6 C)  SpO2: 100% 96%      Lab Data   CBC    Component Value Date/Time   WBC 6.4 04/26/2022 0612   RBC 3.92 04/26/2022 0612   HGB 12.9 04/26/2022 0612   HGB 13.2 08/03/2019 1409   HCT 38.4 04/26/2022 0612   HCT 40.4 08/03/2019 1409  PLT 182 04/26/2022 0612   PLT 253 08/03/2019 1409   MCV 98.0 04/26/2022 0612   MCV 98 (H) 08/03/2019 1409   MCH 32.9 04/26/2022 0612   MCHC 33.6 04/26/2022 0612   RDW 12.2 04/26/2022 0612   RDW 12.1 08/03/2019 1409   LYMPHSABS 2.3 04/26/2022 0612   MONOABS 0.6 04/26/2022 0612   EOSABS 0.5 04/26/2022 0612   BASOSABS 0.0 04/26/2022 0612      Latest Ref Rng & Units 04/26/2022    6:12 AM 04/25/2022    5:37 AM 04/24/2022    3:37 AM  BMP  Glucose 70 - 99 mg/dL 88  84  88   BUN 8 - 23 mg/dL 17  16  17   $ Creatinine 0.44 - 1.00 mg/dL 1.02  1.01  1.02   Sodium 135 - 145 mmol/L 140  139  139   Potassium 3.5 - 5.1 mmol/L 3.4  4.6  3.3   Chloride 98 - 111 mmol/L 107  106  105    CO2 22 - 32 mmol/L 25  24  24   $ Calcium 8.9 - 10.3 mg/dL 8.3  8.4  8.3        Micro Data   Results for orders placed or performed in visit on 03/18/22  Urine Culture     Status: Abnormal   Collection Time: 03/18/22  3:02 PM   Specimen: Urine  Result Value Ref Range Status   Source: URINE  Final   Status: FINAL  Final   Isolate 1: Enterococcus faecalis (A)  Final    Comment: Greater than 100,000 CFU/mL of Enterococcus faecalis      Susceptibility   Enterococcus faecalis - URINE CULTURE POSITIVE 1    AMPICILLIN <=2 Sensitive     VANCOMYCIN 1 Sensitive     NITROFURANTOIN* <=16 Sensitive      * Legend: S = Susceptible  I = Intermediate R = Resistant  NS = Not susceptible * = Not tested  NR = Not reported **NN = See antimicrobic comments     Scheduled Meds:  acidophilus  2 capsule Oral TID   apixaban  5 mg Oral BID   aspirin EC  81 mg Oral Daily   atorvastatin  80 mg Oral Daily   levothyroxine  100 mcg Oral Daily   mirabegron ER  25 mg Oral Daily   sertraline  100 mg Oral Daily   spironolactone  25 mg Oral Daily   Continuous Infusions:  ampicillin (OMNIPEN) IV 2 g (04/25/22 1430)    Principal Problem:   UTI (urinary tract infection) Active Problems:   Hypothyroidism   Essential hypertension   Atrial fibrillation (HCC)   Acute cystitis with hematuria   Acute metabolic encephalopathy   Fall   LOS: 1 day   A & P  Acute cystitis with hematuria Urine culture was positive for E. Faecalis on 03/18/2022. No culture was performed on the patient's urine on this admission. She is receiving ampicillin to which the organism cultured in January was sensitive.  Hypothyroidism The patient has been continued on levothyroxine 100 mcg daily.  Fall Fall at home.  Essential hypertension The patient is currently normotensive without antihypertensive.Monitor.  Atrial fibrillation ([place]) The patient's heart rate is currently well controlled without rate controlling  medication. However she is on apixaban as outpatient.  Acute metabolic encephalopathy Resolved. The patient is awake, alert, and oriented x 3 today. Pleasantly confused.  DVT prophylaxis: Eliquis Code Status: Full Code Family Communication: None available Disposition Plan:  SNF    Tryton Bodi, DO [facility] Direct contact: see [email or web address]  7PM-7AM contact night coverage as above 04/26/2022, 3:47 PM  LOS: 1 day

## 2022-04-26 NOTE — Progress Notes (Signed)
Physical Therapy Treatment Patient Details Name: Yvonne Bailey MRN: [id number] DOB: 30-Mar-1940 Today's Date: 04/26/2022   History of Present Illness 82 yo female presents to ED on 2/13 with suspected UTI. PMHx: dementia, UTIs, CAD, s/p PCI, DVT, HTN, icm, lacunar infarct, MI, PAD, CKD3, stroke.    PT Comments    Pt admitted with above diagnosis. Pt was able to stand to [facility] with min guard to min assist. Pt didn't need as much assist as last visit. Pt did not want to continue to practice stands once moved to chair due to fatigue. Performed some exercises.  Pt currently with functional limitations due to balance and endurance deficits. Pt will benefit from skilled PT to increase their independence and safety with mobility to allow discharge to the venue listed below.      Recommendations for follow up therapy are one component of a multi-disciplinary discharge planning process, led by the attending physician.  Recommendations may be updated based on patient status, additional functional criteria and insurance authorization.  Follow Up Recommendations  Skilled nursing-short term rehab (<3 hours/day) Can patient physically be transported by private vehicle: No   Assistance Recommended at Discharge Frequent or constant Supervision/Assistance  Patient can return home with the following Two people to help with walking and/or transfers;A lot of help with bathing/dressing/bathroom;Direct supervision/assist for medications management;Assistance with cooking/housework;Direct supervision/assist for financial management   Equipment Recommendations  None recommended by PT    Recommendations for Other Services       Precautions / Restrictions Precautions Precautions: Fall Restrictions Weight Bearing Restrictions: No     Mobility  Bed Mobility Overal bed mobility: Needs Assistance Bed Mobility: Supine to Sit, Sit to Supine     Supine to sit: Min assist, +2 for safety/equipment      General bed mobility comments: assist for trunk and LE management however pt initiating more movement than last visit    Transfers Overall transfer level: Needs assistance Equipment used: Ambulation equipment used Transfers: Sit to/from Stand Sit to Stand: Min assist, +2 safety/equipment           General transfer comment: stood with stedy frame min guard assist to rise with pt using hands on bar, Once standing in the frame, pt was min guard assist and standing with UE support only.  moved pt to chair in [place]. Pt stood again and sat in chair.  Asked pt to stand again and the stated she was too tired. Transfer via Lift Equipment: Stedy  Ambulation/Gait               General Gait Details: unable   Marine scientist [place] (Stroke Patients Only)       Balance Overall balance assessment: Needs assistance Sitting-balance support: No upper extremity supported, Feet supported Sitting balance-Leahy Scale: Fair Sitting balance - Comments: Once sitting, pt min guard assist   Standing balance support: Bilateral upper extremity supported, During functional activity Standing balance-Leahy Scale: Poor Standing balance comment: min to min guard A  bias in stedy frame with pt standing much better than last visit.                            Cognition Arousal/Alertness: Awake/alert Behavior During Therapy: WFL for tasks assessed/performed Overall Cognitive Status: History of cognitive impairments - at baseline  General Comments: history of dementia, oriented to self and location. pleasantly confused, follows most simple commands        Exercises General Exercises - Lower Extremity Ankle Circles/Pumps: AROM, Both, 10 reps, Supine Long Arc Quad: AROM, Both, 15 reps, Seated Hip Flexion/Marching: AROM, Both, 15 reps, Seated    General Comments        Pertinent Vitals/Pain  Pain Assessment Pain Assessment: No/denies pain Breathing: normal Negative Vocalization: none Facial Expression: smiling or inexpressive Body Language: relaxed Consolability: no need to console PAINAD Score: 0    Home Living                          Prior Function            PT Goals (current goals can now be found in the care plan section) Acute Rehab PT Goals Patient Stated Goal: home Progress towards PT goals: Progressing toward goals    Frequency    Min 3X/week      PT Plan Current plan remains appropriate    Co-evaluation              AM-PAC PT "6 Clicks" Mobility   Outcome Measure  Help needed turning from your back to your side while in a flat bed without using bedrails?: A Lot Help needed moving from lying on your back to sitting on the side of a flat bed without using bedrails?: A Lot Help needed moving to and from a bed to a chair (including a wheelchair)?: Total Help needed standing up from a chair using your arms (e.g., wheelchair or bedside chair)?: Total Help needed to walk in hospital room?: Total Help needed climbing 3-5 steps with a railing? : Total 6 Click Score: 8    End of Session Equipment Utilized During Treatment: Gait belt Activity Tolerance: Patient limited by fatigue;Other (comment) (pt-reported weakness) Patient left: with call bell/phone within reach;in chair;with chair alarm set Nurse Communication: Mobility status;Need for lift equipment Charlaine Dalton) PT Visit Diagnosis: Other abnormalities of gait and mobility (R26.89);Difficulty in walking, not elsewhere classified (R26.2)     Time: [id number] PT Time Calculation (min) (ACUTE ONLY): 20 min  Charges:  $Therapeutic Activity: 8-22 mins                     [facility] M,PT Acute Rehab Services [phone]    Alvira Philips 04/26/2022, 11:28 AM

## 2022-04-26 NOTE — Plan of Care (Signed)
  Problem: Education: Goal: Knowledge of General Education information will improve Description: Including pain rating scale, medication(s)/side effects and non-pharmacologic comfort measures Outcome: Not Progressing   Problem: Health Behavior/Discharge Planning: Goal: Ability to manage health-related needs will improve Outcome: Not Progressing   Problem: Clinical Measurements: Goal: Ability to maintain clinical measurements within normal limits will improve Outcome: Progressing Goal: Will remain free from infection Outcome: Progressing Goal: Diagnostic test results will improve Outcome: Progressing Goal: Respiratory complications will improve Outcome: Progressing Goal: Cardiovascular complication will be avoided Outcome: Progressing   Problem: Nutrition: Goal: Adequate nutrition will be maintained Outcome: Progressing   Problem: Coping: Goal: Level of anxiety will decrease Outcome: Progressing   Problem: Elimination: Goal: Will not experience complications related to bowel motility Outcome: Progressing Goal: Will not experience complications related to urinary retention Outcome: Progressing   Problem: Pain Managment: Goal: General experience of comfort will improve Outcome: Progressing   Problem: Safety: Goal: Ability to remain free from injury will improve Outcome: Progressing   Problem: Skin Integrity: Goal: Risk for impaired skin integrity will decrease Outcome: Progressing

## 2022-04-26 NOTE — Care Management Important Message (Signed)
Important Message  Patient Details  Name: Yvonne Bailey MRN: [id number] Date of Birth: January 07, 1941   Medicare Important Message Given:  Yes     Orbie Pyo 04/26/2022, 2:04 PM

## 2022-04-27 DIAGNOSIS — N3001 Acute cystitis with hematuria: Secondary | ICD-10-CM | POA: Diagnosis not present

## 2022-04-27 LAB — BASIC METABOLIC PANEL
Anion gap: 10 (ref 5–15)
BUN: 16 mg/dL (ref 8–23)
CO2: 26 mmol/L (ref 22–32)
Calcium: 8.5 mg/dL — ABNORMAL LOW (ref 8.9–10.3)
Chloride: 106 mmol/L (ref 98–111)
Creatinine, Ser: 1.08 mg/dL — ABNORMAL HIGH (ref 0.44–1.00)
GFR, Estimated: 52 mL/min — ABNORMAL LOW (ref >60)
Glucose, Bld: 82 mg/dL (ref 70–99)
Potassium: 3.9 mmol/L (ref 3.5–5.1)
Sodium: 142 mmol/L (ref 135–145)

## 2022-04-27 LAB — CBC WITH DIFFERENTIAL/PLATELET
Abs Immature Granulocytes: 0.04 10*3/uL (ref 0.00–0.07)
Basophils Absolute: 0 10*3/uL (ref 0.0–0.1)
Basophils Relative: 1 %
Eosinophils Absolute: 0.5 10*3/uL (ref 0.0–0.5)
Eosinophils Relative: 6 %
HCT: 36.6 % (ref 36.0–46.0)
Hemoglobin: 12.2 g/dL (ref 12.0–15.0)
Immature Granulocytes: 1 %
Lymphocytes Relative: 35 %
Lymphs Abs: 2.9 10*3/uL (ref 0.7–4.0)
MCH: 32.5 pg (ref 26.0–34.0)
MCHC: 33.3 g/dL (ref 30.0–36.0)
MCV: 97.6 fL (ref 80.0–100.0)
Monocytes Absolute: 0.6 10*3/uL (ref 0.1–1.0)
Monocytes Relative: 7 %
Neutro Abs: 4.4 10*3/uL (ref 1.7–7.7)
Neutrophils Relative %: 50 %
Platelets: 184 10*3/uL (ref 150–400)
RBC: 3.75 MIL/uL — ABNORMAL LOW (ref 3.87–5.11)
RDW: 12.2 % (ref 11.5–15.5)
WBC: 8.5 10*3/uL (ref 4.0–10.5)
nRBC: 0 % (ref 0.0–0.2)

## 2022-04-27 NOTE — Plan of Care (Signed)
  Problem: Education: Goal: Knowledge of General Education information will improve Description: Including pain rating scale, medication(s)/side effects and non-pharmacologic comfort measures Outcome: Progressing   Problem: Clinical Measurements: Goal: Ability to maintain clinical measurements within normal limits will improve Outcome: Progressing Goal: Will remain free from infection Outcome: Progressing   Problem: Activity: Goal: Risk for activity intolerance will decrease Outcome: Progressing   Problem: Nutrition: Goal: Adequate nutrition will be maintained Outcome: Progressing   Problem: Safety: Goal: Ability to remain free from injury will improve Outcome: Progressing   Problem: Skin Integrity: Goal: Risk for impaired skin integrity will decrease Outcome: Progressing

## 2022-04-27 NOTE — Progress Notes (Signed)
Mobility Specialist Progress Note   04/27/22 1145  Mobility  Activity Transferred from bed to chair  Level of Assistance Minimal assist, patient does 75% or more  Assistive Device Front wheel walker  Distance Ambulated (ft) 2 ft  Range of Motion/Exercises Active;All extremities  Activity Response Tolerated well   Patient received in supine and agreeable to participate. Required tactile cues and constant reorientation to task throughout session. Needed min A + cues for bed mobility and mod A to boost into standing. Was able to take steps along bedside with min A for balance and safety.  Tolerated without complaint or incident. Was left in recliner with all needs met, call bell in reach.   [place] Alena Blankenbeckler, BS EXP Mobility Specialist Please contact via SecureChat or Rehab office at [phone]

## 2022-04-27 NOTE — Progress Notes (Signed)
PROGRESS NOTE  Yvonne Bailey [id number] DOB: 1940/11/15 DOA: 04/23/2022 PCP: Yvonne Rail, MD  Brief History   82 year old with past medical history significant for advanced dementia, CAD, PAF on Eliquis, recurrent UTI, hypertension, hyperlipidemia, CKD stage II, hypothyroidism presented to the ED due to worsening confusion, generalized weakness and foul smell urine.   Patient has had recurring UTI since June 2023.  Last 2 months she was treated with nitrofurantoin for Enterobacter UTI.  3 days ago she became more confused lethargic and with a smelly urine.   Admitted with recurring UTI. She was placed on IV ampicillin to which the E. Faecalis that grew from the patient's urine on 03/18/2022.   On the morning of 04/26/2022 the patient remained somewhat confused. She thought that she was at church. She did not know the year or date.   2/17: Patient continued to improve.  PT is recommending SNF, pending bed offer and insurance authorization.  Consultants  None  Procedures  None  Antibiotics   Anti-infectives (From admission, onward)    Start     Dose/Rate Route Frequency Ordered Stop   04/24/22 1000  cefTRIAXone (ROCEPHIN) 1 g in sodium chloride 0.9 % 100 mL IVPB  Status:  Discontinued        1 g 200 mL/hr over 30 Minutes Intravenous Every 24 hours 04/23/22 1415 04/23/22 1433   04/23/22 1445  ampicillin (OMNIPEN) 2 g in sodium chloride 0.9 % 100 mL IVPB        2 g 300 mL/hr over 20 Minutes Intravenous Every 8 hours 04/23/22 1433     04/23/22 1415  cefTRIAXone (ROCEPHIN) 1 g in sodium chloride 0.9 % 100 mL IVPB        1 g 200 mL/hr over 30 Minutes Intravenous  Once 04/23/22 1403 04/23/22 1502        Subjective  Patient was seen and examined today.  No new complaints.  Objective   Vitals:  Vitals:   04/27/22 0344 04/27/22 0830  BP: (!) 155/83 (!) 148/90  Pulse: 73 66  Resp: 18 15  Temp: 98.3 F (36.8 C) 97.8 F (36.6 C)  SpO2: 99% 100%    Exam:  General.   Frail elderly lady, in no acute distress. Pulmonary.  Lungs clear bilaterally, normal respiratory effort. CV.  Regular rate and rhythm, no JVD, rub or murmur. Abdomen.  Soft, nontender, nondistended, BS positive. CNS.  Alert and oriented x 2.  No focal neurologic deficit. Extremities.  No edema, no cyanosis, pulses intact and symmetrical. Psychiatry.  Appears to have some cognitive impairment   I have personally reviewed the following:   Today's Data   Vitals:   04/27/22 0344 04/27/22 0830  BP: (!) 155/83 (!) 148/90  Pulse: 73 66  Resp: 18 15  Temp: 98.3 F (36.8 C) 97.8 F (36.6 C)  SpO2: 99% 100%      Lab Data   CBC    Component Value Date/Time   WBC 8.5 04/27/2022 0350   RBC 3.75 (L) 04/27/2022 0350   HGB 12.2 04/27/2022 0350   HGB 13.2 08/03/2019 1409   HCT 36.6 04/27/2022 0350   HCT 40.4 08/03/2019 1409   PLT 184 04/27/2022 0350   PLT 253 08/03/2019 1409   MCV 97.6 04/27/2022 0350   MCV 98 (H) 08/03/2019 1409   MCH 32.5 04/27/2022 0350   MCHC 33.3 04/27/2022 0350   RDW 12.2 04/27/2022 0350   RDW 12.1 08/03/2019 1409   LYMPHSABS 2.9 04/27/2022 0350  MONOABS 0.6 04/27/2022 0350   EOSABS 0.5 04/27/2022 0350   BASOSABS 0.0 04/27/2022 0350      Latest Ref Rng & Units 04/27/2022    3:50 AM 04/26/2022    6:12 AM 04/25/2022    5:37 AM  BMP  Glucose 70 - 99 mg/dL 82  88  84   BUN 8 - 23 mg/dL 16  17  16   $ Creatinine 0.44 - 1.00 mg/dL 1.08  1.02  1.01   Sodium 135 - 145 mmol/L 142  140  139   Potassium 3.5 - 5.1 mmol/L 3.9  3.4  4.6   Chloride 98 - 111 mmol/L 106  107  106   CO2 22 - 32 mmol/L 26  25  24   $ Calcium 8.9 - 10.3 mg/dL 8.5  8.3  8.4        Micro Data   Results for orders placed or performed in visit on 03/18/22  Urine Culture     Status: Abnormal   Collection Time: 03/18/22  3:02 PM   Specimen: Urine  Result Value Ref Range Status   Source: URINE  Final   Status: FINAL  Final   Isolate 1: Enterococcus faecalis (A)  Final    Comment:  Greater than 100,000 CFU/mL of Enterococcus faecalis      Susceptibility   Enterococcus faecalis - URINE CULTURE POSITIVE 1    AMPICILLIN <=2 Sensitive     VANCOMYCIN 1 Sensitive     NITROFURANTOIN* <=16 Sensitive      * Legend: S = Susceptible  I = Intermediate R = Resistant  NS = Not susceptible * = Not tested  NR = Not reported **NN = See antimicrobic comments     Scheduled Meds:  acidophilus  2 capsule Oral TID   apixaban  5 mg Oral BID   aspirin EC  81 mg Oral Daily   atorvastatin  80 mg Oral Daily   levothyroxine  100 mcg Oral Daily   mirabegron ER  25 mg Oral Daily   sertraline  100 mg Oral Daily   spironolactone  25 mg Oral Daily   Continuous Infusions:  ampicillin (OMNIPEN) IV 2 g (04/27/22 0527)    Principal Problem:   UTI (urinary tract infection) Active Problems:   Hypothyroidism   Essential hypertension   Atrial fibrillation (HCC)   Acute cystitis with hematuria   Acute metabolic encephalopathy   Fall   LOS: 3 days   A & P  Acute cystitis with hematuria Urine culture was positive for E. Faecalis on 03/18/2022. No culture was performed on the patient's urine on this admission. She is receiving ampicillin to which the organism cultured in January was sensitive.  Hypothyroidism The patient has been continued on levothyroxine 100 mcg daily.  Fall Fall at home.  Essential hypertension The patient is currently normotensive without antihypertensive.Monitor.  Atrial fibrillation ([place]) The patient's heart rate is currently well controlled without rate controlling medication. However she is on apixaban as outpatient.  Acute metabolic encephalopathy Resolved. The patient is awake, alert, and oriented x 3 today. Pleasantly confused.  DVT prophylaxis: Eliquis Code Status: Full Code Family Communication: None available Disposition Plan: SNF   This record has been created using Systems analyst. Errors have been sought and corrected,but  may not always be located. Such creation errors do not reflect on the standard of care.   Yvonne Bailey , MD [facility] Direct contact: see [email or web address]  7PM-7AM contact night coverage as above 04/26/2022,  3:47 PM  LOS: 1 day

## 2022-04-28 DIAGNOSIS — N3001 Acute cystitis with hematuria: Secondary | ICD-10-CM | POA: Diagnosis not present

## 2022-04-28 NOTE — Progress Notes (Signed)
PROGRESS NOTE  Yvonne Bailey [id number] DOB: Apr 04, 1940 DOA: 04/23/2022 PCP: Binnie Rail, MD  Brief History   82 year old with past medical history significant for advanced dementia, CAD, PAF on Eliquis, recurrent UTI, hypertension, hyperlipidemia, CKD stage II, hypothyroidism presented to the ED due to worsening confusion, generalized weakness and foul smell urine.   Patient has had recurring UTI since June 2023.  Last 2 months she was treated with nitrofurantoin for Enterobacter UTI.  3 days ago she became more confused lethargic and with a smelly urine.   Admitted with recurring UTI. She was placed on IV ampicillin to which the E. Faecalis that grew from the patient's urine on 03/18/2022.   On the morning of 04/26/2022 the patient remained somewhat confused. She thought that she was at church. She did not know the year or date.   2/17: Patient continued to improve.  PT is recommending SNF, pending bed offer and insurance authorization.  2/18: Patient with no new concern.  Consultants  None  Procedures  None  Antibiotics   Anti-infectives (From admission, onward)    Start     Dose/Rate Route Frequency Ordered Stop   04/24/22 1000  cefTRIAXone (ROCEPHIN) 1 g in sodium chloride 0.9 % 100 mL IVPB  Status:  Discontinued        1 g 200 mL/hr over 30 Minutes Intravenous Every 24 hours 04/23/22 1415 04/23/22 1433   04/23/22 1445  ampicillin (OMNIPEN) 2 g in sodium chloride 0.9 % 100 mL IVPB        2 g 300 mL/hr over 20 Minutes Intravenous Every 8 hours 04/23/22 1433     04/23/22 1415  cefTRIAXone (ROCEPHIN) 1 g in sodium chloride 0.9 % 100 mL IVPB        1 g 200 mL/hr over 30 Minutes Intravenous  Once 04/23/22 1403 04/23/22 1502        Subjective  Patient was resting comfortably when seen today.  No new complaints  Objective   Vitals:  Vitals:   04/28/22 0510 04/28/22 1729  BP: (!) 156/94 (!) 141/90  Pulse: 80 82  Resp: 18 16  Temp: 97.8 F (36.6 C) 97.8 F  (36.6 C)  SpO2: 99% 100%    Exam:  General.  Frail elderly lady, in no acute distress. Pulmonary.  Lungs clear bilaterally, normal respiratory effort. CV.  Regular rate and rhythm, no JVD, rub or murmur. Abdomen.  Soft, nontender, nondistended, BS positive. CNS.  Alert and oriented .  No focal neurologic deficit. Extremities.  No edema, no cyanosis, pulses intact and symmetrical. Psychiatry.  Appears to have some cognitive impairment   I have personally reviewed the following:   Today's Data   Vitals:   04/28/22 0510 04/28/22 1729  BP: (!) 156/94 (!) 141/90  Pulse: 80 82  Resp: 18 16  Temp: 97.8 F (36.6 C) 97.8 F (36.6 C)  SpO2: 99% 100%      Lab Data   CBC    Component Value Date/Time   WBC 8.5 04/27/2022 0350   RBC 3.75 (L) 04/27/2022 0350   HGB 12.2 04/27/2022 0350   HGB 13.2 08/03/2019 1409   HCT 36.6 04/27/2022 0350   HCT 40.4 08/03/2019 1409   PLT 184 04/27/2022 0350   PLT 253 08/03/2019 1409   MCV 97.6 04/27/2022 0350   MCV 98 (H) 08/03/2019 1409   MCH 32.5 04/27/2022 0350   MCHC 33.3 04/27/2022 0350   RDW 12.2 04/27/2022 0350   RDW 12.1 08/03/2019 1409  LYMPHSABS 2.9 04/27/2022 0350   MONOABS 0.6 04/27/2022 0350   EOSABS 0.5 04/27/2022 0350   BASOSABS 0.0 04/27/2022 0350      Latest Ref Rng & Units 04/27/2022    3:50 AM 04/26/2022    6:12 AM 04/25/2022    5:37 AM  BMP  Glucose 70 - 99 mg/dL 82  88  84   BUN 8 - 23 mg/dL 16  17  16   $ Creatinine 0.44 - 1.00 mg/dL 1.08  1.02  1.01   Sodium 135 - 145 mmol/L 142  140  139   Potassium 3.5 - 5.1 mmol/L 3.9  3.4  4.6   Chloride 98 - 111 mmol/L 106  107  106   CO2 22 - 32 mmol/L 26  25  24   $ Calcium 8.9 - 10.3 mg/dL 8.5  8.3  8.4        Micro Data   Results for orders placed or performed in visit on 03/18/22  Urine Culture     Status: Abnormal   Collection Time: 03/18/22  3:02 PM   Specimen: Urine  Result Value Ref Range Status   Source: URINE  Final   Status: FINAL  Final   Isolate  1: Enterococcus faecalis (A)  Final    Comment: Greater than 100,000 CFU/mL of Enterococcus faecalis      Susceptibility   Enterococcus faecalis - URINE CULTURE POSITIVE 1    AMPICILLIN <=2 Sensitive     VANCOMYCIN 1 Sensitive     NITROFURANTOIN* <=16 Sensitive      * Legend: S = Susceptible  I = Intermediate R = Resistant  NS = Not susceptible * = Not tested  NR = Not reported **NN = See antimicrobic comments     Scheduled Meds:  acidophilus  2 capsule Oral TID   apixaban  5 mg Oral BID   aspirin EC  81 mg Oral Daily   atorvastatin  80 mg Oral Daily   levothyroxine  100 mcg Oral Daily   mirabegron ER  25 mg Oral Daily   sertraline  100 mg Oral Daily   spironolactone  25 mg Oral Daily   Continuous Infusions:  ampicillin (OMNIPEN) IV 2 g (04/28/22 1451)    Principal Problem:   UTI (urinary tract infection) Active Problems:   Hypothyroidism   Essential hypertension   Atrial fibrillation (HCC)   Acute cystitis with hematuria   Acute metabolic encephalopathy   Fall   LOS: 4 days   A & P  Acute cystitis with hematuria Urine culture was positive for E. Faecalis on 03/18/2022. No culture was performed on the patient's urine on this admission. She is receiving ampicillin to which the organism cultured in January was sensitive.  Hypothyroidism The patient has been continued on levothyroxine 100 mcg daily.  Fall Fall at home.  Essential hypertension The patient is currently normotensive without antihypertensive.Monitor.  Atrial fibrillation ([place]) The patient's heart rate is currently well controlled without rate controlling medication. However she is on apixaban as outpatient.  Acute metabolic encephalopathy Resolved. The patient is awake, alert, and oriented x 3 today. Pleasantly confused.  DVT prophylaxis: Eliquis Code Status: Full Code Family Communication: None available Disposition Plan: SNF   This record has been created using TEFL teacher. Errors have been sought and corrected,but may not always be located. Such creation errors do not reflect on the standard of care.   Lorella Nimrod , MD [facility] Direct contact: see [email or web address]  7PM-7AM  contact night coverage as above 04/26/2022, 3:47 PM  LOS: 1 day

## 2022-04-29 DIAGNOSIS — N3001 Acute cystitis with hematuria: Secondary | ICD-10-CM | POA: Diagnosis not present

## 2022-04-29 NOTE — TOC Progression Note (Addendum)
Transition of Care [facility]) - Progression Note    Patient Details  Name: Yvonne Bailey MRN: [id number] Date of Birth: 1940/11/19  Transition of [facility]) CM/SW Contact  Jinger Neighbors, [place] Phone Number: 04/29/2022, 12:12 PM  Clinical Narrative:    CSW printed bed offers and presented to pt. Pt reports she would like to go somewhere near her home. CSW called Jenny Reichmann, pt's son to discuss bed offers. CSW reviewed rating system. [name] wanted offer to also be sent to [employer] [employer]; CSW sent CSW initial referral to [employer]. If [facility] [employer] denies, pt's son would like pt to go to [facility].  CSW followed up with Jenny Reichmann, pt's son to inform him of denial from [facility]. [name] states he would like his mother to go to [facility]. CSW messaged [facility] to request auth    Expected Discharge Plan: [place]    Expected Discharge Plan and Services                                               Social Determinants of Health (SDOH) Interventions SDOH Screenings   Food Insecurity: No Food Insecurity (12/17/2021)  Housing: Low Risk  (12/17/2021)  Transportation Needs: No Transportation Needs (12/17/2021)  Utilities: Not At Risk (12/17/2021)  Alcohol Screen: Low Risk  (12/17/2021)  Depression (PHQ2-9): Low Risk  (03/18/2022)  Financial Resource Strain: Low Risk  (12/17/2021)  Physical Activity: Inactive (12/17/2021)  Social Connections: Unknown (12/17/2021)  Stress: No Stress Concern Present (12/17/2021)  Tobacco Use: Medium Risk (04/23/2022)    Readmission Risk Interventions    11/19/2021   10:32 AM  Readmission Risk Prevention Plan  Transportation Screening Complete  PCP or Specialist Appt within 5-7 Days Complete  Home Care Screening Complete  Medication Review (RN CM) Complete

## 2022-04-29 NOTE — Progress Notes (Signed)
Physical Therapy Treatment Patient Details Name: Yvonne Bailey MRN: [id number] DOB: November 25, 1940 Today's Date: 04/29/2022   History of Present Illness 82 yo female presents to ED on 2/13 with suspected UTI. PMHx: dementia, UTIs, CAD, s/p PCI, DVT, HTN, icm, lacunar infarct, MI, PAD, CKD3, stroke.    PT Comments    Pt resting upon PT arrival to room, requires mod encouragement to participate with PT. Pt found to be soiled in stool and urine after transition to EOB, pt then motivated to go to bathroom for clean up. Pt overall requiring min-mod physical assist for room-distance mobility at this time, making slow but steady progress towards PT goals. D/c plan remains appropriate at this time, PT to continue to follow.      Recommendations for follow up therapy are one component of a multi-disciplinary discharge planning process, led by the attending physician.  Recommendations may be updated based on patient status, additional functional criteria and insurance authorization.  Follow Up Recommendations  Skilled nursing-short term rehab (<3 hours/day) Can patient physically be transported by private vehicle: No   Assistance Recommended at Discharge Frequent or constant Supervision/Assistance  Patient can return home with the following Direct supervision/assist for medications management;Assistance with cooking/housework;Direct supervision/assist for financial management;A little help with walking and/or transfers;A little help with bathing/dressing/bathroom   Equipment Recommendations  None recommended by PT    Recommendations for Other Services       Precautions / Restrictions Precautions Precautions: Fall Restrictions Weight Bearing Restrictions: No     Mobility  Bed Mobility Overal bed mobility: Needs Assistance Bed Mobility: Supine to Sit     Supine to sit: Mod assist     General bed mobility comments: assist for trunk elevation and scooting to EOB with assist of bed  pad    Transfers Overall transfer level: Needs assistance Equipment used: Rolling walker (2 wheels) Transfers: Sit to/from Stand Sit to Stand: Min assist           General transfer comment: assist for power up and steadying once standing, pt with preference for posterior bias so occasionally needs posterior cues to correct. STS x3, from EOB x1 and toilet x2.    Ambulation/Gait Ambulation/Gait assistance: Min assist Gait Distance (Feet): 15 Feet (+5) Assistive device: Rolling walker (2 wheels) Gait Pattern/deviations: Step-through pattern, Trunk flexed Gait velocity: decr     General Gait Details: assist to steady and guide RW, cues for upright posture, proximity to [employer] Mobility    Modified Rankin (Stroke Patients Only)       Balance Overall balance assessment: Needs assistance Sitting-balance support: No upper extremity supported, Feet supported Sitting balance-Leahy Scale: Fair     Standing balance support: Bilateral upper extremity supported, During functional activity, Reliant on assistive device for balance Standing balance-Leahy Scale: Poor                              Cognition Arousal/Alertness: Awake/alert Behavior During Therapy: WFL for tasks assessed/performed Overall Cognitive Status: History of cognitive impairments - at baseline                                 General Comments: history of dementia, A&O to self, location, and reason for admission. Pt perseverative on taking a shower, has memory deficits as once PT explains  task and we start doing task, pt states "what are we doing? where are we going?"        Exercises      General Comments        Pertinent Vitals/Pain Pain Assessment Pain Assessment: No/denies pain    Home Living                          Prior Function            PT Goals (current goals can now be found in the care plan section) Acute Rehab  PT Goals Patient Stated Goal: home PT Goal Formulation: With patient Time For Goal Achievement: 05/08/22 Potential to Achieve Goals: Good Progress towards PT goals: Progressing toward goals    Frequency    Min 2X/week      PT Plan Current plan remains appropriate    Co-evaluation              AM-PAC PT "6 Clicks" Mobility   Outcome Measure  Help needed turning from your back to your side while in a flat bed without using bedrails?: A Little Help needed moving from lying on your back to sitting on the side of a flat bed without using bedrails?: A Lot Help needed moving to and from a bed to a chair (including a wheelchair)?: A Lot Help needed standing up from a chair using your arms (e.g., wheelchair or bedside chair)?: A Little Help needed to walk in hospital room?: A Little Help needed climbing 3-5 steps with a railing? : A Lot 6 Click Score: 15    End of Session   Activity Tolerance: Patient tolerated treatment well;Patient limited by fatigue Patient left: with call bell/phone within reach;in chair;with chair alarm set Nurse Communication: Mobility status;Other (comment) (NT informed of pt urinary and stool incontinence) PT Visit Diagnosis: Other abnormalities of gait and mobility (R26.89);Difficulty in walking, not elsewhere classified (R26.2)     Time: [id number] PT Time Calculation (min) (ACUTE ONLY): 33 min  Charges:  $Therapeutic Activity: 23-37 mins                     Stacie Glaze, PT DPT Acute Rehabilitation Services Pager [phone]  Office [phone]    Roxine Caddy E Ruffin Pyo 04/29/2022, 12:11 PM

## 2022-04-29 NOTE — Progress Notes (Signed)
PROGRESS NOTE  Yvonne Bailey [id number] DOB: [birth year] DOA: 04/23/2022 PCP: Binnie Rail, MD  Brief History   82 year old with past medical history significant for advanced dementia, CAD, PAF on Eliquis, recurrent UTI, hypertension, hyperlipidemia, CKD stage II, hypothyroidism presented to the ED due to worsening confusion, generalized weakness and foul smell urine.   Patient has had recurring UTI since June 2023.  Last 2 months she was treated with nitrofurantoin for Enterobacter UTI.  3 days ago she became more confused lethargic and with a smelly urine.   Admitted with recurring UTI. She was placed on IV ampicillin to which the E. Faecalis that grew from the patient's urine on 03/18/2022.   On the morning of 04/26/2022 the patient remained somewhat confused. She thought that she was at church. She did not know the year or date.   2/17: Patient continued to improve.  PT is recommending SNF, pending bed offer and insurance authorization.  2/18: Patient with no new concern.  2/19: Remained stable, still awaiting SNF placement.  Will complete the course of antibiotics today.  Consultants  None  Procedures  None  Antibiotics   Anti-infectives (From admission, onward)    Start     Dose/Rate Route Frequency Ordered Stop   04/24/22 1000  cefTRIAXone (ROCEPHIN) 1 g in sodium chloride 0.9 % 100 mL IVPB  Status:  Discontinued        1 g 200 mL/hr over 30 Minutes Intravenous Every 24 hours 04/23/22 1415 04/23/22 1433   04/23/22 1445  ampicillin (OMNIPEN) 2 g in sodium chloride 0.9 % 100 mL IVPB        2 g 300 mL/hr over 20 Minutes Intravenous Every 8 hours 04/23/22 1433 04/29/22 2359   04/23/22 1415  cefTRIAXone (ROCEPHIN) 1 g in sodium chloride 0.9 % 100 mL IVPB        1 g 200 mL/hr over 30 Minutes Intravenous  Once 04/23/22 1403 04/23/22 1502        Subjective  Patient with no new concern.  Objective   Vitals:  Vitals:   04/29/22 0415 04/29/22 0806  BP: (!)  147/86 (!) 142/65  Pulse: 84 61  Resp: 17 16  Temp: 98.6 F (37 C) 97.8 F (36.6 C)  SpO2: 100% 97%    Exam:  General.  Frail elderly lady, in no acute distress. Pulmonary.  Lungs clear bilaterally, normal respiratory effort. CV.  Regular rate and rhythm, no JVD, rub or murmur. Abdomen.  Soft, nontender, nondistended, BS positive. CNS.  Alert and oriented .  No focal neurologic deficit. Extremities.  No edema, no cyanosis, pulses intact and symmetrical. Psychiatry.  Judgment and insight appears impaired  I have personally reviewed the following:   Today's Data   Vitals:   04/29/22 0415 04/29/22 0806  BP: (!) 147/86 (!) 142/65  Pulse: 84 61  Resp: 17 16  Temp: 98.6 F (37 C) 97.8 F (36.6 C)  SpO2: 100% 97%      Lab Data   CBC    Component Value Date/Time   WBC 8.5 04/27/2022 0350   RBC 3.75 (L) 04/27/2022 0350   HGB 12.2 04/27/2022 0350   HGB 13.2 08/03/2019 1409   HCT 36.6 04/27/2022 0350   HCT 40.4 08/03/2019 1409   PLT 184 04/27/2022 0350   PLT 253 08/03/2019 1409   MCV 97.6 04/27/2022 0350   MCV 98 (H) 08/03/2019 1409   MCH 32.5 04/27/2022 0350   MCHC 33.3 04/27/2022 0350   RDW 12.2  04/27/2022 0350   RDW 12.1 08/03/2019 1409   LYMPHSABS 2.9 04/27/2022 0350   MONOABS 0.6 04/27/2022 0350   EOSABS 0.5 04/27/2022 0350   BASOSABS 0.0 04/27/2022 0350      Latest Ref Rng & Units 04/27/2022    3:50 AM 04/26/2022    6:12 AM 04/25/2022    5:37 AM  BMP  Glucose 70 - 99 mg/dL 82  88  84   BUN 8 - 23 mg/dL 16  17  16   $ Creatinine 0.44 - 1.00 mg/dL 1.08  1.02  1.01   Sodium 135 - 145 mmol/L 142  140  139   Potassium 3.5 - 5.1 mmol/L 3.9  3.4  4.6   Chloride 98 - 111 mmol/L 106  107  106   CO2 22 - 32 mmol/L 26  25  24   $ Calcium 8.9 - 10.3 mg/dL 8.5  8.3  8.4        Micro Data   Results for orders placed or performed in visit on 03/18/22  Urine Culture     Status: Abnormal   Collection Time: 03/18/22  3:02 PM   Specimen: Urine  Result Value Ref  Range Status   Source: URINE  Final   Status: FINAL  Final   Isolate 1: Enterococcus faecalis (A)  Final    Comment: Greater than 100,000 CFU/mL of Enterococcus faecalis      Susceptibility   Enterococcus faecalis - URINE CULTURE POSITIVE 1    AMPICILLIN <=2 Sensitive     VANCOMYCIN 1 Sensitive     NITROFURANTOIN* <=16 Sensitive      * Legend: S = Susceptible  I = Intermediate R = Resistant  NS = Not susceptible * = Not tested  NR = Not reported **NN = See antimicrobic comments     Scheduled Meds:  acidophilus  2 capsule Oral TID   apixaban  5 mg Oral BID   aspirin EC  81 mg Oral Daily   atorvastatin  80 mg Oral Daily   levothyroxine  100 mcg Oral Daily   mirabegron ER  25 mg Oral Daily   sertraline  100 mg Oral Daily   spironolactone  25 mg Oral Daily   Continuous Infusions:  ampicillin (OMNIPEN) IV 2 g (04/29/22 1317)    Principal Problem:   UTI (urinary tract infection) Active Problems:   Hypothyroidism   Essential hypertension   Atrial fibrillation (HCC)   Acute cystitis with hematuria   Acute metabolic encephalopathy   Fall   LOS: 5 days   A & P  Acute cystitis with hematuria Urine culture was positive for E. Faecalis on 03/18/2022. No culture was performed on the patient's urine on this admission. She is receiving ampicillin to which the organism cultured in January was sensitive. Will complete the course of antibiotics today.  Hypothyroidism The patient has been continued on levothyroxine 100 mcg daily.  Fall Fall at home.  Essential hypertension Blood pressure mostly within goal. -Continue with spironolactone  Atrial fibrillation ([place]) The patient's heart rate is currently well controlled without rate controlling medication. However she is on apixaban as outpatient.  Acute metabolic encephalopathy Resolved. The patient is awake, alert, and oriented x 3 today. Pleasantly confused.  DVT prophylaxis: Eliquis Code Status: Full Code Family  Communication: Talked with son on phone. Disposition Plan: SNF   This record has been created using Systems analyst. Errors have been sought and corrected,but may not always be located. Such creation errors do  not reflect on the standard of care.   Lorella Nimrod , MD [facility] Direct contact: see [email or web address]  7PM-7AM contact night coverage as above

## 2022-04-29 NOTE — Consult Note (Signed)
   [facility] CM Inpatient Consult   04/29/2022  Yvonne Bailey 27-Jan-1941 [id number]  Orientation with Drue Flirt ([facility] Liaison)  [facility]) [place] Organization ([place]) Patient: [name] Insurance  Primary Care Provider:Burns, Claudina Lick, MD @ [facility]  Patient (pt) screened for hospitalization with noted medium risk score for unplanned readmission risk and has a history of [place] for care coordination needs in the past. Explained benefits of [facility] services along with information provided for the 24 hour concierge nurse advise line. Also provided a [facility] magnet and appointment reminder card for Primary Care provider (PCP).  Met with pt via bedside with PCP information as pt accepted.  Plan: HIPAA verified as pt to be discharged to a SNF upon her acceptance to a SNF bed offer that is currently pending. Will continue to follow for a final disposition.  For any inquires, please contact:  Raina Mina, RN, [place] [phone] Hours 8:30-5:00 pm M-F Fax Number: [phone] [place] Nurse Line [phone]

## 2022-04-29 NOTE — Progress Notes (Signed)
Mobility Specialist - Progress Note   04/29/22 1513  Mobility  Activity Refused mobility   Pt refused mobility with no specific reason. Will continue to follow.   Yvonne Bailey  Mobility Specialist Please contact via Solicitor or Rehab office at [phone]

## 2022-04-30 ENCOUNTER — Other Ambulatory Visit: Payer: Self-pay | Admitting: Internal Medicine

## 2022-04-30 DIAGNOSIS — N3001 Acute cystitis with hematuria: Secondary | ICD-10-CM | POA: Diagnosis not present

## 2022-04-30 DIAGNOSIS — F4489 Other dissociative and conversion disorders: Secondary | ICD-10-CM | POA: Diagnosis not present

## 2022-04-30 DIAGNOSIS — E785 Hyperlipidemia, unspecified: Secondary | ICD-10-CM | POA: Diagnosis not present

## 2022-04-30 DIAGNOSIS — F419 Anxiety disorder, unspecified: Secondary | ICD-10-CM | POA: Diagnosis not present

## 2022-04-30 DIAGNOSIS — F03B0 Unspecified dementia, moderate, without behavioral disturbance, psychotic disturbance, mood disturbance, and anxiety: Secondary | ICD-10-CM

## 2022-04-30 DIAGNOSIS — I482 Chronic atrial fibrillation, unspecified: Secondary | ICD-10-CM | POA: Diagnosis not present

## 2022-04-30 DIAGNOSIS — I480 Paroxysmal atrial fibrillation: Secondary | ICD-10-CM | POA: Diagnosis not present

## 2022-04-30 DIAGNOSIS — E876 Hypokalemia: Secondary | ICD-10-CM | POA: Diagnosis not present

## 2022-04-30 DIAGNOSIS — R2681 Unsteadiness on feet: Secondary | ICD-10-CM | POA: Diagnosis not present

## 2022-04-30 DIAGNOSIS — R2689 Other abnormalities of gait and mobility: Secondary | ICD-10-CM | POA: Diagnosis not present

## 2022-04-30 DIAGNOSIS — R278 Other lack of coordination: Secondary | ICD-10-CM | POA: Diagnosis not present

## 2022-04-30 DIAGNOSIS — G9341 Metabolic encephalopathy: Secondary | ICD-10-CM | POA: Diagnosis not present

## 2022-04-30 DIAGNOSIS — I10 Essential (primary) hypertension: Secondary | ICD-10-CM | POA: Diagnosis not present

## 2022-04-30 DIAGNOSIS — E039 Hypothyroidism, unspecified: Secondary | ICD-10-CM | POA: Diagnosis not present

## 2022-04-30 DIAGNOSIS — R531 Weakness: Secondary | ICD-10-CM | POA: Diagnosis not present

## 2022-04-30 DIAGNOSIS — M6281 Muscle weakness (generalized): Secondary | ICD-10-CM | POA: Diagnosis not present

## 2022-04-30 DIAGNOSIS — R21 Rash and other nonspecific skin eruption: Secondary | ICD-10-CM | POA: Diagnosis not present

## 2022-04-30 DIAGNOSIS — N390 Urinary tract infection, site not specified: Secondary | ICD-10-CM | POA: Diagnosis not present

## 2022-04-30 DIAGNOSIS — Z7401 Bed confinement status: Secondary | ICD-10-CM | POA: Diagnosis not present

## 2022-04-30 MED ORDER — RISAQUAD PO CAPS: 2.0000 | ORAL_CAPSULE | Freq: Three times a day (TID) | ORAL | Status: DC

## 2022-04-30 NOTE — TOC Transition Note (Signed)
Transition of Care [facility]) - CM/SW Discharge Note   Patient Details  Name: Yvonne Bailey MRN: [id number] Date of Birth: 11/18/1940  Transition of Care [facility]) CM/SW Contact:  Jinger Neighbors, LCSW Phone Number: 04/30/2022, 11:39 AM   Clinical Narrative:     PT going to [facility] via [facility] Call to report: [phone] Room: [place] pt's son notified of d/c and will need to take pt's Eloquise to SNF; he agreed.   Final next level of care: Skilled Nursing Facility Barriers to Discharge: No Barriers Identified   Patient Goals and CMS Choice CMS Medicare.gov Compare Post Acute Care list provided to:: Patient Represenative (must comment) (pt's son) Choice offered to / list presented to : Adult Children  Discharge Placement                Patient chooses bed at: [facility] Patient to be transferred to facility by: [facility] Name of family member notified: [name] Patient and family notified of of transfer: 04/30/22  Discharge Plan and Services Additional resources added to the After Visit Summary for                                       Social Determinants of Health (SDOH) Interventions [place]: No Food Insecurity (12/17/2021)  Housing: Low Risk  (12/17/2021)  Transportation Needs: No Transportation Needs (12/17/2021)  Utilities: Not At Risk (12/17/2021)  Alcohol Screen: Low Risk  (12/17/2021)  Depression (PHQ2-9): Low Risk  (03/18/2022)  Financial Resource Strain: Low Risk  (12/17/2021)  Physical Activity: Inactive (12/17/2021)  Social Connections: Unknown (12/17/2021)  Stress: No Stress Concern Present (12/17/2021)  Tobacco Use: Medium Risk (04/23/2022)     Readmission Risk Interventions    11/19/2021   10:32 AM  Readmission Risk Prevention Plan  Transportation Screening Complete  PCP or Specialist Appt within 5-7 Days Complete  Home Care Screening Complete  Medication Review (RN CM) Complete

## 2022-04-30 NOTE — Progress Notes (Signed)
Peripheral IV removed prior to discharge with no complications. Report called to Mon at [facility]. All questions answered.

## 2022-04-30 NOTE — Discharge Summary (Signed)
Physician Discharge Summary   Patient: Yvonne Bailey MRN: [id number] DOB: 05/12/1940  Admit date:     04/23/2022  Discharge date: 04/30/22  Discharge Physician: Lorella Nimrod   PCP: Binnie Rail, MD   Recommendations at discharge:  Please obtain CBC and BMP in 1 week Follow-up with primary care provider within a week  Discharge Diagnoses: Principal Problem:   UTI (urinary tract infection) Active Problems:   Hypothyroidism   Essential hypertension   Atrial fibrillation ([place])   Acute cystitis with hematuria   Acute metabolic encephalopathy   Fall   Moderate dementia [facility])   Hospital Course: 82 year old with past medical history significant for advanced dementia, CAD, PAF on Eliquis, recurrent UTI, hypertension, hyperlipidemia, CKD stage II, hypothyroidism presented to the ED due to worsening confusion, generalized weakness and foul smell urine.   Patient has had recurring UTI since June 2023.  Last 2 months she was treated with nitrofurantoin for Enterobacter UTI.  3 days ago she became more confused lethargic and with a smelly urine.   Admitted with recurring UTI. She was placed on IV ampicillin to which the E. Faecalis that grew from the patient's urine on 03/18/2022. Her lethargy has resolved and her encephalopathy is clearing.  She appears to be now at baseline.  She completed the course of antibiotics with 7 days of ampicillin while in the hospital..  Our physical therapist evaluated her and recommending short-term rehab where she is being discharged for further management.  We discontinued home aspirin and she will continue on home Eliquis to decrease the risk of bleeding with her history of falls.  She will continue with rest of her home medications and need to have a close follow-up with her providers for further recommendations and management.    Assessment and Plan: Fall Fall at home.  Acute metabolic encephalopathy Resolved. The patient is awake, alert, and  oriented x 3 today.  Acute cystitis with hematuria Urine culture was positive for E. Faecalis on 03/18/2022. No culture was performed on the patient's urine on this admission. She is receiving ampicillin to which the organism cultured in January was sensitive and completed the course while in the hospital.  Atrial fibrillation ([place]) The patient's heart rate is currently well controlled without rate controlling medication. However she is on apixaban as outpatient.  Essential hypertension The patient is currently normotensive without antihypertensive.Monitor.  Hypothyroidism The patient has been continued on levothyroxine 100 mcg daily.   Consultants: None Procedures performed: None Disposition: Skilled nursing facility Diet recommendation:  Discharge Diet Orders (From admission, onward)     Start     Ordered   04/30/22 0000  Diet - low sodium heart healthy        04/30/22 1111           Cardiac diet DISCHARGE MEDICATION: Allergies as of 04/30/2022       Reactions   Definity [perflutren Lipid Microsphere] Other (See Comments)   Patient experienced back pain with injection   Pletal [cilostazol] Other (See Comments)   Made pt "feel funny"        Medication List     STOP taking these medications    aspirin EC 81 MG tablet   nitrofurantoin (macrocrystal-monohydrate) 100 MG capsule Commonly known as: Macrobid       TAKE these medications    acidophilus Caps capsule Take 2 capsules by mouth 3 (three) times daily.   atorvastatin 40 MG tablet Commonly known as: LIPITOR Take 2 tablets (80 mg total)  by mouth daily.   dorzolamide-timolol 2-0.5 % ophthalmic solution Commonly known as: COSOPT Place 1 drop into both eyes in the morning, at noon, and at bedtime.   Eliquis 5 MG Tabs tablet Generic drug: apixaban TAKE 1 TABLET(5 MG) BY MOUTH TWICE DAILY What changed: See the new instructions.   levothyroxine 100 MCG tablet Commonly known as: SYNTHROID Take 1  tablet (100 mcg total) by mouth daily.   Myrbetriq 25 MG Tb24 tablet Generic drug: mirabegron ER TAKE 1 TABLET(25 MG) BY MOUTH DAILY What changed: See the new instructions.   nitroGLYCERIN 0.4 MG SL tablet Commonly known as: Nitrostat Place 1 tablet (0.4 mg total) under the tongue every 5 (five) minutes as needed for chest pain.   polyethylene glycol 17 g packet Commonly known as: MiraLax Take one dose 3 times a day until bowel clear, maximum of 3 consecutive days. What changed:  how much to take how to take this when to take this reasons to take this additional instructions   Rhopressa 0.02 % Soln Generic drug: Netarsudil Dimesylate Place 1 drop into both eyes every evening.   sertraline 100 MG tablet Commonly known as: ZOLOFT Take 1 tablet (100 mg total) by mouth daily.   spironolactone 25 MG tablet Commonly known as: ALDACTONE TAKE 1 TABLET(25 MG) BY MOUTH DAILY What changed: See the new instructions.        Follow-up Information     Binnie Rail, MD. Schedule an appointment as soon as possible for a visit in 1 week(s).   Specialty: Internal Medicine Contact information: [place] [address] [phone]                Discharge Exam: Danley Danker Weights   04/23/22 1101 04/23/22 1658  Weight: 68 kg 73.5 kg   General.     In no acute distress. Pulmonary.  Lungs clear bilaterally, normal respiratory effort. CV.  Regular rate and rhythm, no JVD, rub or murmur. Abdomen.  Soft, nontender, nondistended, BS positive. CNS.  Alert and oriented .  No focal neurologic deficit. Extremities.  No edema, no cyanosis, pulses intact and symmetrical. Psychiatry.  Judgment and insight appears normal.   Condition at discharge: stable  The results of significant diagnostics from this hospitalization (including imaging, microbiology, ancillary and laboratory) are listed below for reference.   Imaging Studies: CT HEAD WO CONTRAST (5MM)  Result  Date: 04/23/2022 CLINICAL DATA:  Mental status change, unknown cause. EXAM: CT HEAD WITHOUT CONTRAST TECHNIQUE: Contiguous axial images were obtained from the base of the skull through the vertex without intravenous contrast. RADIATION DOSE REDUCTION: This exam was performed according to the departmental dose-optimization program which includes automated exposure control, adjustment of the mA and/or kV according to patient size and/or use of iterative reconstruction technique. COMPARISON:  Head CT 02/28/2022. FINDINGS: Brain: No acute intracranial hemorrhage. 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 Ex vacuo dilatation of the ventricles. No extra-axial collection. Basilar cisterns are patent. Vascular: No hyperdense vessel. Skull: No calvarial fracture or suspicious bone lesion. Skull base is unremarkable. Sinuses/Orbits: Unremarkable. Other: None. IMPRESSION: 1. No acute intracranial abnormality. 2. 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 Electronically Signed   By: Emmit Alexanders M.D.   On: 04/23/2022 16:42   US RENAL  Result Date: 04/23/2022 CLINICAL DATA:  Pyelonephritis. EXAM: RENAL / URINARY TRACT ULTRASOUND COMPLETE COMPARISON:  February 13, 2021. FINDINGS: Right Kidney:  Renal measurements: 9.8 x 5.1 x 4.7 cm = volume: 122 mL. Increased echogenicity of renal parenchyma is noted suggesting medical renal disease. 4.1 cm simple cyst is noted for which no further follow-up is required. No mass or hydronephrosis visualized. Left Kidney: Renal measurements: 9.3 x 5.9 x 4.6 cm = volume: 131 mL. Increased echogenicity of renal parenchyma is noted suggesting medical renal disease. No mass or hydronephrosis visualized. Bladder: Appears normal for degree of bladder distention. Other: Exam is somewhat limited due to combative nature of patient. IMPRESSION: Exam is limited due to  patient's combative nature. Increased echogenicity of renal parenchyma is noted bilaterally suggesting medical renal disease. No hydronephrosis or renal obstruction is noted. Electronically Signed   By: Marijo Conception M.D.   On: 04/23/2022 15:02    Microbiology: Results for orders placed or performed in visit on 03/18/22  Urine Culture     Status: Abnormal   Collection Time: 03/18/22  3:02 PM   Specimen: Urine  Result Value Ref Range Status   Source: URINE  Final   Status: FINAL  Final   Isolate 1: Enterococcus faecalis (A)  Final    Comment: Greater than 100,000 CFU/mL of Enterococcus faecalis      Susceptibility   Enterococcus faecalis - URINE CULTURE POSITIVE 1    AMPICILLIN <=2 Sensitive     VANCOMYCIN 1 Sensitive     NITROFURANTOIN* <=16 Sensitive      * Legend: S = Susceptible  I = Intermediate R = Resistant  NS = Not susceptible * = Not tested  NR = Not reported **NN = See antimicrobic comments     Labs: CBC: Recent Labs  Lab 04/25/22 0537 04/26/22 0612 04/27/22 0350  WBC 7.1 6.4 8.5  NEUTROABS  --  2.9 4.4  HGB 12.8 12.9 12.2  HCT 39.0 38.4 36.6  MCV 99.5 98.0 97.6  PLT 182 182 [id number]   Basic Metabolic Panel: Recent Labs  Lab 04/24/22 0337 04/25/22 0537 04/26/22 0612 04/27/22 0350  NA 139 139 140 142  K 3.3* 4.6 3.4* 3.9  CL 105 106 107 106  CO2 24 24 25 26  $ GLUCOSE 88 84 88 82  BUN 17 16 17 16  $ CREATININE 1.02* 1.01* 1.02* 1.08*  CALCIUM 8.3* 8.4* 8.3* 8.5*   Liver Function Tests: No results for input(s): "AST", "ALT", "ALKPHOS", "BILITOT", "PROT", "ALBUMIN" in the last 168 hours. CBG: No results for input(s): "GLUCAP" in the last 168 hours.  Discharge time spent: greater than 30 minutes.  This record has been created using Systems analyst. Errors have been sought and corrected,but may not always be located. Such creation errors do not reflect on the standard of care.   Signed: Lorella Nimrod, MD [facility] 04/30/2022

## 2022-04-30 NOTE — Plan of Care (Signed)
  Problem: Education: Goal: Knowledge of General Education information will improve Description: Including pain rating scale, medication(s)/side effects and non-pharmacologic comfort measures Outcome: Progressing   Problem: Clinical Measurements: Goal: Will remain free from infection Outcome: Progressing   Problem: Activity: Goal: Risk for activity intolerance will decrease Outcome: Progressing   Problem: Nutrition: Goal: Adequate nutrition will be maintained Outcome: Progressing   Problem: Pain Managment: Goal: General experience of comfort will improve Outcome: Progressing   Problem: Safety: Goal: Ability to remain free from injury will improve Outcome: Progressing   Problem: Skin Integrity: Goal: Risk for impaired skin integrity will decrease Outcome: Progressing   

## 2022-05-01 DIAGNOSIS — I10 Essential (primary) hypertension: Secondary | ICD-10-CM | POA: Diagnosis not present

## 2022-05-01 DIAGNOSIS — E785 Hyperlipidemia, unspecified: Secondary | ICD-10-CM | POA: Diagnosis not present

## 2022-05-01 DIAGNOSIS — E876 Hypokalemia: Secondary | ICD-10-CM | POA: Diagnosis not present

## 2022-05-01 DIAGNOSIS — E039 Hypothyroidism, unspecified: Secondary | ICD-10-CM | POA: Diagnosis not present

## 2022-05-02 DIAGNOSIS — R531 Weakness: Secondary | ICD-10-CM | POA: Diagnosis not present

## 2022-05-02 DIAGNOSIS — I10 Essential (primary) hypertension: Secondary | ICD-10-CM | POA: Diagnosis not present

## 2022-05-02 DIAGNOSIS — I480 Paroxysmal atrial fibrillation: Secondary | ICD-10-CM | POA: Diagnosis not present

## 2022-05-02 DIAGNOSIS — N390 Urinary tract infection, site not specified: Secondary | ICD-10-CM | POA: Diagnosis not present

## 2022-05-21 ENCOUNTER — Other Ambulatory Visit: Payer: Self-pay | Admitting: Internal Medicine

## 2022-05-21 ENCOUNTER — Other Ambulatory Visit: Payer: Self-pay

## 2022-05-21 DIAGNOSIS — I739 Peripheral vascular disease, unspecified: Secondary | ICD-10-CM

## 2022-05-28 ENCOUNTER — Other Ambulatory Visit: Payer: Self-pay

## 2022-05-28 ENCOUNTER — Telehealth: Payer: Self-pay | Admitting: Internal Medicine

## 2022-05-28 ENCOUNTER — Other Ambulatory Visit (INDEPENDENT_AMBULATORY_CARE_PROVIDER_SITE_OTHER): Payer: Medicare HMO

## 2022-05-28 DIAGNOSIS — R300 Dysuria: Secondary | ICD-10-CM

## 2022-05-28 LAB — URINALYSIS, ROUTINE W REFLEX MICROSCOPIC
Bilirubin Urine: NEGATIVE
Ketones, ur: NEGATIVE
Nitrite: NEGATIVE
Specific Gravity, Urine: 1.025 (ref 1.000–1.030)
Urine Glucose: NEGATIVE
Urobilinogen, UA: 0.2 (ref 0.0–1.0)
pH: 6 (ref 5.0–8.0)

## 2022-05-28 NOTE — Telephone Encounter (Signed)
Labs placed.

## 2022-05-28 NOTE — Telephone Encounter (Signed)
Patient's son feels like his mom has a UTI - can he bring in a specimen - please call son and let him know - [phone]

## 2022-05-29 ENCOUNTER — Emergency Department (HOSPITAL_COMMUNITY)
Admission: EM | Admit: 2022-05-29 | Discharge: 2022-05-29 | Disposition: A | Discharge status: Home / Self Care | Payer: Medicare HMO | Attending: Emergency Medicine | Admitting: Emergency Medicine

## 2022-05-29 ENCOUNTER — Encounter: Payer: Self-pay | Admitting: Internal Medicine

## 2022-05-29 ENCOUNTER — Encounter (HOSPITAL_COMMUNITY): Payer: Self-pay | Admitting: Emergency Medicine

## 2022-05-29 ENCOUNTER — Other Ambulatory Visit: Payer: Self-pay

## 2022-05-29 DIAGNOSIS — Z1152 Encounter for screening for COVID-19: Secondary | ICD-10-CM | POA: Diagnosis not present

## 2022-05-29 DIAGNOSIS — R4182 Altered mental status, unspecified: Secondary | ICD-10-CM | POA: Insufficient documentation

## 2022-05-29 DIAGNOSIS — I4891 Unspecified atrial fibrillation: Secondary | ICD-10-CM | POA: Insufficient documentation

## 2022-05-29 DIAGNOSIS — R410 Disorientation, unspecified: Secondary | ICD-10-CM | POA: Insufficient documentation

## 2022-05-29 DIAGNOSIS — N183 Chronic kidney disease, stage 3 unspecified: Secondary | ICD-10-CM | POA: Insufficient documentation

## 2022-05-29 DIAGNOSIS — Z7901 Long term (current) use of anticoagulants: Secondary | ICD-10-CM | POA: Insufficient documentation

## 2022-05-29 DIAGNOSIS — N390 Urinary tract infection, site not specified: Secondary | ICD-10-CM | POA: Insufficient documentation

## 2022-05-29 DIAGNOSIS — Z79899 Other long term (current) drug therapy: Secondary | ICD-10-CM | POA: Insufficient documentation

## 2022-05-29 DIAGNOSIS — R509 Fever, unspecified: Secondary | ICD-10-CM | POA: Diagnosis not present

## 2022-05-29 DIAGNOSIS — I129 Hypertensive chronic kidney disease with stage 1 through stage 4 chronic kidney disease, or unspecified chronic kidney disease: Secondary | ICD-10-CM | POA: Insufficient documentation

## 2022-05-29 DIAGNOSIS — R000 Tachycardia, unspecified: Secondary | ICD-10-CM | POA: Diagnosis not present

## 2022-05-29 DIAGNOSIS — I10 Essential (primary) hypertension: Secondary | ICD-10-CM | POA: Diagnosis not present

## 2022-05-29 LAB — RESP PANEL BY RT-PCR (RSV, FLU A&B, COVID)  RVPGX2
Influenza A by PCR: NEGATIVE
Influenza B by PCR: NEGATIVE
Resp Syncytial Virus by PCR: NEGATIVE
SARS Coronavirus 2 by RT PCR: NEGATIVE

## 2022-05-29 LAB — URINALYSIS, W/ REFLEX TO CULTURE (INFECTION SUSPECTED)
Bilirubin Urine: NEGATIVE
Glucose, UA: NEGATIVE mg/dL
Ketones, ur: NEGATIVE mg/dL
Nitrite: NEGATIVE
Protein, ur: NEGATIVE mg/dL
Specific Gravity, Urine: 1.013 (ref 1.005–1.030)
WBC, UA: >50 WBC/hpf (ref 0–5)
pH: 6 (ref 5.0–8.0)

## 2022-05-29 LAB — CBC WITH DIFFERENTIAL/PLATELET
Abs Immature Granulocytes: 0.02 10*3/uL (ref 0.00–0.07)
Basophils Absolute: 0.1 10*3/uL (ref 0.0–0.1)
Basophils Relative: 1 %
Eosinophils Absolute: 0.2 10*3/uL (ref 0.0–0.5)
Eosinophils Relative: 2 %
HCT: 40.1 % (ref 36.0–46.0)
Hemoglobin: 12.9 g/dL (ref 12.0–15.0)
Immature Granulocytes: 0 %
Lymphocytes Relative: 24 %
Lymphs Abs: 1.9 10*3/uL (ref 0.7–4.0)
MCH: 32 pg (ref 26.0–34.0)
MCHC: 32.2 g/dL (ref 30.0–36.0)
MCV: 99.5 fL (ref 80.0–100.0)
Monocytes Absolute: 0.7 10*3/uL (ref 0.1–1.0)
Monocytes Relative: 8 %
Neutro Abs: 5 10*3/uL (ref 1.7–7.7)
Neutrophils Relative %: 65 %
Platelets: 216 10*3/uL (ref 150–400)
RBC: 4.03 MIL/uL (ref 3.87–5.11)
RDW: 12.7 % (ref 11.5–15.5)
WBC: 7.8 10*3/uL (ref 4.0–10.5)
nRBC: 0 % (ref 0.0–0.2)

## 2022-05-29 LAB — COMPREHENSIVE METABOLIC PANEL
ALT: 17 U/L (ref 0–44)
AST: 23 U/L (ref 15–41)
Albumin: 3.4 g/dL — ABNORMAL LOW (ref 3.5–5.0)
Alkaline Phosphatase: 101 U/L (ref 38–126)
Anion gap: 9 (ref 5–15)
BUN: 19 mg/dL (ref 8–23)
CO2: 26 mmol/L (ref 22–32)
Calcium: 8.8 mg/dL — ABNORMAL LOW (ref 8.9–10.3)
Chloride: 107 mmol/L (ref 98–111)
Creatinine, Ser: 1.08 mg/dL — ABNORMAL HIGH (ref 0.44–1.00)
GFR, Estimated: 52 mL/min — ABNORMAL LOW (ref >60)
Glucose, Bld: 82 mg/dL (ref 70–99)
Potassium: 4.5 mmol/L (ref 3.5–5.1)
Sodium: 142 mmol/L (ref 135–145)
Total Bilirubin: 0.7 mg/dL (ref 0.3–1.2)
Total Protein: 6.9 g/dL (ref 6.5–8.1)

## 2022-05-29 LAB — CBG MONITORING, ED: Glucose-Capillary: 84 mg/dL (ref 70–99)

## 2022-05-29 MED ORDER — CEFUROXIME AXETIL 500 MG PO TABS: 500.0000 mg | ORAL_TABLET | Freq: Two times a day (BID) | ORAL | 0 refills | Status: AC

## 2022-05-29 MED ORDER — SODIUM CHLORIDE 0.9 % IV SOLN
1.0000 g | Freq: Once | INTRAVENOUS | Status: AC
  Administered 2022-05-29: 1 g via INTRAVENOUS
  Filled 2022-05-29: qty 10

## 2022-05-29 NOTE — ED Notes (Signed)
RN updated pt son, Jenny Reichmann, on pt care. [name] don't have any additional questions at this time.

## 2022-05-29 NOTE — Discharge Instructions (Addendum)
You were seen in the emergency department today for urinary tract infection.  We gave you IV antibiotics here in the emergency department and I am placing you on an antibiotic called cefuroxime twice a day over the next 7 days.  Please drink plenty of fluids.  Please return to the emergency department for worsening confusion, fever, and decreased oral intake of food and fluids.  Please follow-up with primary care provider in 3 days for symptom recheck.

## 2022-05-29 NOTE — ED Notes (Signed)
RN provided pt another bag lunch

## 2022-05-29 NOTE — ED Notes (Signed)
Pt requesting food and beverage. Pt states she hasn't eaten yet. RN notified EDP

## 2022-05-29 NOTE — ED Triage Notes (Signed)
Pt here from home with c/o possible UTI , family collected an urine and sent to family md but have not got the results back yet , cbg 112 , per family pt is slightly more altered than normal and mobility is less than usual

## 2022-05-29 NOTE — ED Notes (Signed)
RN gave pt bag lunch and beverage.

## 2022-05-29 NOTE — ED Notes (Signed)
Pt alert, NAD, calm, interactive, resps e/u, speaking clearly, updated, pending son arrival, d/c paperwork at [facility].

## 2022-05-29 NOTE — ED Notes (Signed)
Pt ambulated approximately 40 ft with a walker. Pt maintained a steady gait.

## 2022-05-29 NOTE — ED Provider Notes (Signed)
[facility] Provider Note   CSN: [id number] Arrival date & time: 05/29/22  [id number]     History  Chief Complaint  Patient presents with   Altered Mental Status    Yvonne Bailey is a 82 y.o. female.  With past medical history of hypertension, atrial fibrillation, stage III chronic kidney disease, stroke, MI who presents to the emergency department with altered mental status.  Level 5 caveat: Altered mental status  Spoke with son, [name], on the phone who is concerned that she has UTI.  He states that beginning on Monday she has been less coherent.  He states that she normally can walk about 400 feet and put on her own close but has been unable to do so.  He states that they have a pure wick at home and he has noticed that her urine has been more foul-smelling.  He states that he took a urine sample to the doctor so that they would check her for UTI however the results have not come back yet.  He states that this morning the patient did not want to get out of bed and had been not talking.  He states that this is similar to previous episodes of UTI.  He denies her having fever at home, complaining of abdominal pain, cough, nausea, vomiting or diarrhea.  HPI     Home Medications Prior to Admission medications   Medication Sig Start Date End Date Taking? Authorizing Provider  cefUROXime (CEFTIN) 500 MG tablet Take 1 tablet (500 mg total) by mouth 2 (two) times daily with a meal for 7 days. 05/29/22 06/05/22 Yes Mickie Hillier, PA-C  acidophilus (RISAQUAD) CAPS capsule Take 2 capsules by mouth 3 (three) times daily. 04/30/22   Lorella Nimrod, MD  atorvastatin (LIPITOR) 40 MG tablet Take 2 tablets (80 mg total) by mouth daily. 01/16/22   Binnie Rail, MD  dorzolamide-timolol (COSOPT) 2-0.5 % ophthalmic solution Place 1 drop into both eyes in the morning, at noon, and at bedtime.    [provider]  ELIQUIS 5 MG TABS tablet TAKE 1  TABLET(5 MG) BY MOUTH TWICE DAILY 05/21/22   Binnie Rail, MD  levothyroxine (SYNTHROID) 100 MCG tablet Take 1 tablet (100 mcg total) by mouth daily. 03/19/22   Binnie Rail, MD  MYRBETRIQ 25 MG TB24 tablet TAKE 1 TABLET(25 MG) BY MOUTH DAILY 04/30/22   Burns, Claudina Lick, MD  Netarsudil Dimesylate (RHOPRESSA) 0.02 % SOLN Place 1 drop into both eyes every evening.    [provider]  nitroGLYCERIN (NITROSTAT) 0.4 MG SL tablet Place 1 tablet (0.4 mg total) under the tongue every 5 (five) minutes as needed for chest pain. 09/08/15   Arbutus Leas, NP  polyethylene glycol (MIRALAX) 17 g packet Take one dose 3 times a day until bowel clear, maximum of 3 consecutive days. Patient taking differently: Take 17 g by mouth daily as needed for mild constipation. 10/30/20   Charlann Lange, PA-C  sertraline (ZOLOFT) 100 MG tablet Take 1 tablet (100 mg total) by mouth daily. 05/24/21   Binnie Rail, MD  spironolactone (ALDACTONE) 25 MG tablet TAKE 1 TABLET(25 MG) BY MOUTH DAILY Patient taking differently: Take 25 mg by mouth daily. 09/26/21   Binnie Rail, MD      Allergies    Definity [perflutren lipid microsphere] and Pletal [cilostazol]    Review of Systems   Review of Systems  Unable to perform ROS: Mental  status change    Physical Exam Updated Vital Signs BP (!) 156/72   Pulse 64   Temp 97.9 F (36.6 C) (Oral)   Resp 18   SpO2 100%  Physical Exam Vitals and nursing note reviewed.  Constitutional:      General: She is not in acute distress.    Appearance: She is ill-appearing.  HENT:     Head: Normocephalic and atraumatic.     Mouth/Throat:     Mouth: Mucous membranes are dry.     Pharynx: Oropharynx is clear.  Eyes:     General: No scleral icterus.    Extraocular Movements: Extraocular movements intact.  Cardiovascular:     Rate and Rhythm: Normal rate and regular rhythm.     Pulses: Normal pulses.  Pulmonary:     Effort: Pulmonary effort is normal. No respiratory distress.   Abdominal:     General: Bowel sounds are normal. There is no distension.     Palpations: Abdomen is soft.     Tenderness: There is no abdominal tenderness. There is no guarding.  Skin:    General: Skin is warm and dry.     Capillary Refill: Capillary refill takes less than 2 seconds.  Neurological:     General: No focal deficit present.     Mental Status: She is alert. She is confused.     GCS: GCS eye subscore is 4. GCS verbal subscore is 2. GCS motor subscore is 6.     Motor: Motor function is intact.  Psychiatric:        Speech: She is noncommunicative. Speech is delayed.        Behavior: Behavior is cooperative.     ED Results / Procedures / Treatments   Labs (all labs ordered are listed, but only abnormal results are displayed) Labs Reviewed  COMPREHENSIVE METABOLIC PANEL - Abnormal; Notable for the following components:      Result Value   Creatinine, Ser 1.08 (*)    Calcium 8.8 (*)    Albumin 3.4 (*)    GFR, Estimated 52 (*)    All other components within normal limits  URINALYSIS, W/ REFLEX TO CULTURE (INFECTION SUSPECTED) - Abnormal; Notable for the following components:   APPearance HAZY (*)    Hgb urine dipstick LARGE (*)    Leukocytes,Ua LARGE (*)    Bacteria, UA RARE (*)    All other components within normal limits  RESP PANEL BY RT-PCR (RSV, FLU A&B, COVID)  [id number]  URINE CULTURE  CBC WITH DIFFERENTIAL/PLATELET  CBG MONITORING, ED    EKG EKG Interpretation  Date/Time:  Wednesday May 29 2022 09:24:30 EDT Ventricular Rate:  64 PR Interval:    QRS Duration: 98 QT Interval:  429 QTC Calculation: 443 R Axis:   -13 Text Interpretation: Atrial fibrillation versus sr w sa and pac - low amplitude p waves w artefact Anterior infarct, old Abnormal ECG Confirmed by Carmin Muskrat [phone]) on 05/29/2022 9:30:14 AM  Radiology No results found.  Procedures Procedures   Medications Ordered in ED Medications  cefTRIAXone (ROCEPHIN) 1 g in sodium chloride  0.9 % 100 mL IVPB (0 g Intravenous Stopped 05/29/22 1334)    ED Course/ Medical Decision Making/ A&P   Medical Decision Making Amount and/or Complexity of Data Reviewed Labs: ordered.  Risk Prescription drug management.  Initial Impression and Ddx 82 year old female who presents with altered mental status Patient PMH that increases complexity of ED encounter: Stroke, MI, hypertension, atrial fibrillation, chronic kidney disease Differential:  Stroke, TIA, toxidrome, infection, etc.  Interpretation of Diagnostics I independent reviewed and interpreted the labs as followed: UA consistent with UTI, CMP without AKI, electrolyte dysfunction.  CBC without leukocytosis.  Urine culture was sent.  - I independently visualized the following imaging with scope of interpretation limited to determining acute life threatening conditions related to emergency care: Not indicated  Patient Reassessment and Ultimate Disposition/Management 82 year old female who presents with altered mental status.  On my initial evaluation she is essentially nonverbal.  She goes to speak but then does not have any response to my questions.  She does follow commands and moves all of her extremities well.  She does not grimace to abdominal palpation.  Her lungs are clear bilaterally.  She is hemodynamically stable and afebrile. Based on son's recollection over the past week, possible UTI symptoms.  Will obtain labs including urine.  CMP without AKI or electrolyte dysfunction.  Her UA does demonstrate UTI.  A urine culture was sent.  There is no leukocytosis and she is afebrile here. -Given her 1 g IV Rocephin while here.  Throughout her stay she had significant improvement in her mental status.  She ate 3 meals, ambulate in the hallway.  She is alert and pleasant and responsive.  She is asking to go home.  I have called and spoken with her son [name] about results and need for oral antibiotics at home.  I will prescribe  her cefuroxime twice daily over the next week.  It appears that she has had previous cultures with Enterococcus species and this will likely be more helpful than Keflex.  The son also said that having for times a day antibiotics will be challenging so we will opt for twice daily dosing.  I have given them strict return precautions should she have worsening mental status but feel that best treat her at home for UTI then in the hospital as she will likely have worsening mental status or delirium here.  Spoke with Dr. Vanita Panda, ED attending who agrees with the plan of care.  The patient has been appropriately medically screened and/or stabilized in the ED. I have low suspicion for any other emergent medical condition which would require further screening, evaluation or treatment in the ED or require inpatient management. At time of discharge the patient is hemodynamically stable and in no acute distress. I have discussed work-up results and diagnosis with patient and answered all questions. Patient is agreeable with discharge plan. We discussed strict return precautions for returning to the emergency department and they verbalized understanding.     Patient management required discussion with the following services or consulting groups:  None  Complexity of Problems Addressed Acute complicated illness or Injury  Additional Data Reviewed and Analyzed Further history obtained from: Past medical history and medications listed in the EMR, Prior ED visit notes, Care Everywhere, and Prior labs/imaging results  Patient Encounter Risk Assessment Prescriptions, SDOH impact on management, and Consideration of hospitalization  Final Clinical Impression(s) / ED Diagnoses Final diagnoses:  Lower urinary tract infectious disease    Rx / DC Orders ED Discharge Orders          Ordered    cefUROXime (CEFTIN) 500 MG tablet  2 times daily with meals        05/29/22 1445              Mickie Hillier,  PA-C 05/29/22 1453    Carmin Muskrat, MD 05/30/22 1631

## 2022-05-29 NOTE — ED Notes (Signed)
Pt provided bag lunch and beverage. 

## 2022-05-29 NOTE — ED Notes (Signed)
RN notified pt son she is ready for d/c. Pt son stated he is 45 minutes away.

## 2022-05-30 LAB — URINE CULTURE: Culture: >=100000 — AB

## 2022-05-31 LAB — CULTURE, URINE COMPREHENSIVE

## 2022-06-05 ENCOUNTER — Telehealth: Payer: Self-pay

## 2022-06-05 NOTE — Telephone Encounter (Signed)
     Patient  visit on 3/20  at [facility]    Have you been able to follow up with your primary care physician? Yes   The patient was or was not able to obtain any needed medicine or equipment. Yes   Are there diet recommendations that you are having difficulty following? Na   Patient expresses understanding of discharge instructions and education provided has no other needs at this time.  Yes      [facility] [phone] [address], [place], [place] [address] Phone: [phone] Email: [email or web address]

## 2022-06-26 ENCOUNTER — Observation Stay (HOSPITAL_COMMUNITY): Payer: Medicare HMO

## 2022-06-26 ENCOUNTER — Emergency Department (HOSPITAL_COMMUNITY): Payer: Medicare HMO

## 2022-06-26 ENCOUNTER — Other Ambulatory Visit: Payer: Self-pay

## 2022-06-26 ENCOUNTER — Observation Stay (HOSPITAL_COMMUNITY)
Admission: EM | Admit: 2022-06-26 | Discharge: 2022-06-27 | Disposition: A | Discharge status: Home / Self Care | Payer: Medicare HMO | Attending: Internal Medicine | Admitting: Internal Medicine

## 2022-06-26 ENCOUNTER — Encounter (HOSPITAL_COMMUNITY): Payer: Self-pay | Admitting: Emergency Medicine

## 2022-06-26 DIAGNOSIS — Z86718 Personal history of other venous thrombosis and embolism: Secondary | ICD-10-CM | POA: Diagnosis not present

## 2022-06-26 DIAGNOSIS — F32A Depression, unspecified: Secondary | ICD-10-CM | POA: Diagnosis present

## 2022-06-26 DIAGNOSIS — I739 Peripheral vascular disease, unspecified: Secondary | ICD-10-CM | POA: Diagnosis present

## 2022-06-26 DIAGNOSIS — G9341 Metabolic encephalopathy: Secondary | ICD-10-CM | POA: Diagnosis not present

## 2022-06-26 DIAGNOSIS — I10 Essential (primary) hypertension: Secondary | ICD-10-CM | POA: Diagnosis present

## 2022-06-26 DIAGNOSIS — G459 Transient cerebral ischemic attack, unspecified: Secondary | ICD-10-CM | POA: Diagnosis not present

## 2022-06-26 DIAGNOSIS — E039 Hypothyroidism, unspecified: Secondary | ICD-10-CM | POA: Diagnosis not present

## 2022-06-26 DIAGNOSIS — N183 Chronic kidney disease, stage 3 unspecified: Secondary | ICD-10-CM | POA: Insufficient documentation

## 2022-06-26 DIAGNOSIS — R4182 Altered mental status, unspecified: Secondary | ICD-10-CM | POA: Diagnosis not present

## 2022-06-26 DIAGNOSIS — I2510 Atherosclerotic heart disease of native coronary artery without angina pectoris: Secondary | ICD-10-CM | POA: Insufficient documentation

## 2022-06-26 DIAGNOSIS — I5022 Chronic systolic (congestive) heart failure: Secondary | ICD-10-CM | POA: Diagnosis not present

## 2022-06-26 DIAGNOSIS — F0390 Unspecified dementia without behavioral disturbance: Secondary | ICD-10-CM | POA: Insufficient documentation

## 2022-06-26 DIAGNOSIS — Z87891 Personal history of nicotine dependence: Secondary | ICD-10-CM | POA: Insufficient documentation

## 2022-06-26 DIAGNOSIS — I480 Paroxysmal atrial fibrillation: Secondary | ICD-10-CM | POA: Insufficient documentation

## 2022-06-26 DIAGNOSIS — I4891 Unspecified atrial fibrillation: Secondary | ICD-10-CM | POA: Diagnosis present

## 2022-06-26 DIAGNOSIS — F419 Anxiety disorder, unspecified: Secondary | ICD-10-CM | POA: Diagnosis present

## 2022-06-26 DIAGNOSIS — R600 Localized edema: Secondary | ICD-10-CM | POA: Diagnosis present

## 2022-06-26 DIAGNOSIS — E785 Hyperlipidemia, unspecified: Secondary | ICD-10-CM | POA: Diagnosis present

## 2022-06-26 DIAGNOSIS — Z7901 Long term (current) use of anticoagulants: Secondary | ICD-10-CM | POA: Insufficient documentation

## 2022-06-26 DIAGNOSIS — R001 Bradycardia, unspecified: Secondary | ICD-10-CM | POA: Diagnosis not present

## 2022-06-26 DIAGNOSIS — I130 Hypertensive heart and chronic kidney disease with heart failure and stage 1 through stage 4 chronic kidney disease, or unspecified chronic kidney disease: Secondary | ICD-10-CM | POA: Insufficient documentation

## 2022-06-26 DIAGNOSIS — G319 Degenerative disease of nervous system, unspecified: Secondary | ICD-10-CM | POA: Diagnosis not present

## 2022-06-26 DIAGNOSIS — Z79899 Other long term (current) drug therapy: Secondary | ICD-10-CM | POA: Insufficient documentation

## 2022-06-26 LAB — URINALYSIS, ROUTINE W REFLEX MICROSCOPIC
Bacteria, UA: NONE SEEN
Bilirubin Urine: NEGATIVE
Glucose, UA: 50 mg/dL — AB
Hgb urine dipstick: NEGATIVE
Ketones, ur: NEGATIVE mg/dL
Nitrite: NEGATIVE
Protein, ur: NEGATIVE mg/dL
Specific Gravity, Urine: 1.009 (ref 1.005–1.030)
pH: 7 (ref 5.0–8.0)

## 2022-06-26 LAB — CBC WITH DIFFERENTIAL/PLATELET
Abs Immature Granulocytes: 0.01 10*3/uL (ref 0.00–0.07)
Basophils Absolute: 0.1 10*3/uL (ref 0.0–0.1)
Basophils Relative: 1 %
Eosinophils Absolute: 0.2 10*3/uL (ref 0.0–0.5)
Eosinophils Relative: 3 %
HCT: 40.7 % (ref 36.0–46.0)
Hemoglobin: 12.9 g/dL (ref 12.0–15.0)
Immature Granulocytes: 0 %
Lymphocytes Relative: 26 %
Lymphs Abs: 1.9 10*3/uL (ref 0.7–4.0)
MCH: 31.6 pg (ref 26.0–34.0)
MCHC: 31.7 g/dL (ref 30.0–36.0)
MCV: 99.8 fL (ref 80.0–100.0)
Monocytes Absolute: 0.5 10*3/uL (ref 0.1–1.0)
Monocytes Relative: 7 %
Neutro Abs: 4.6 10*3/uL (ref 1.7–7.7)
Neutrophils Relative %: 63 %
Platelets: 235 10*3/uL (ref 150–400)
RBC: 4.08 MIL/uL (ref 3.87–5.11)
RDW: 12.8 % (ref 11.5–15.5)
WBC: 7.3 10*3/uL (ref 4.0–10.5)
nRBC: 0 % (ref 0.0–0.2)

## 2022-06-26 LAB — COMPREHENSIVE METABOLIC PANEL
ALT: 19 U/L (ref 0–44)
AST: 27 U/L (ref 15–41)
Albumin: 3.5 g/dL (ref 3.5–5.0)
Alkaline Phosphatase: 108 U/L (ref 38–126)
Anion gap: 9 (ref 5–15)
BUN: 20 mg/dL (ref 8–23)
CO2: 26 mmol/L (ref 22–32)
Calcium: 9.3 mg/dL (ref 8.9–10.3)
Chloride: 105 mmol/L (ref 98–111)
Creatinine, Ser: 1.11 mg/dL — ABNORMAL HIGH (ref 0.44–1.00)
GFR, Estimated: 50 mL/min — ABNORMAL LOW (ref >60)
Glucose, Bld: 88 mg/dL (ref 70–99)
Potassium: 4.6 mmol/L (ref 3.5–5.1)
Sodium: 140 mmol/L (ref 135–145)
Total Bilirubin: 0.6 mg/dL (ref 0.3–1.2)
Total Protein: 6.9 g/dL (ref 6.5–8.1)

## 2022-06-26 LAB — I-STAT VENOUS BLOOD GAS, ED
Acid-Base Excess: 0 mmol/L (ref 0.0–2.0)
Bicarbonate: 27.1 mmol/L (ref 20.0–28.0)
Calcium, Ion: 1.19 mmol/L (ref 1.15–1.40)
HCT: 41 % (ref 36.0–46.0)
Hemoglobin: 13.9 g/dL (ref 12.0–15.0)
O2 Saturation: 92 %
Potassium: 4.6 mmol/L (ref 3.5–5.1)
Sodium: 140 mmol/L (ref 135–145)
TCO2: 29 mmol/L (ref 22–32)
pCO2, Ven: 51.4 mmHg (ref 44–60)
pH, Ven: 7.331 (ref 7.25–7.43)
pO2, Ven: 68 mmHg — ABNORMAL HIGH (ref 32–45)

## 2022-06-26 LAB — LACTIC ACID, PLASMA
Lactic Acid, Venous: 0.8 mmol/L (ref 0.5–1.9)
Lactic Acid, Venous: 0.8 mmol/L (ref 0.5–1.9)

## 2022-06-26 LAB — AMMONIA: Ammonia: 28 umol/L (ref 9–35)

## 2022-06-26 LAB — TSH: TSH: 12.019 u[IU]/mL — ABNORMAL HIGH (ref 0.350–4.500)

## 2022-06-26 LAB — CBG MONITORING, ED: Glucose-Capillary: 68 mg/dL — ABNORMAL LOW (ref 70–99)

## 2022-06-26 MED ORDER — LEVOTHYROXINE SODIUM 100 MCG PO TABS
100.0000 ug | ORAL_TABLET | Freq: Every day | ORAL | Status: DC
  Administered 2022-06-27: 100 ug via ORAL
  Filled 2022-06-26: qty 1

## 2022-06-26 MED ORDER — HYDRALAZINE HCL 20 MG/ML IJ SOLN: 5.0000 mg | Freq: Four times a day (QID) | INTRAMUSCULAR | Status: DC | PRN

## 2022-06-26 MED ORDER — DORZOLAMIDE HCL-TIMOLOL MAL 2-0.5 % OP SOLN
1.0000 [drp] | Freq: Two times a day (BID) | OPHTHALMIC | Status: DC
  Administered 2022-06-26 – 2022-06-27 (×2): 1 [drp] via OPHTHALMIC
  Filled 2022-06-26: qty 10

## 2022-06-26 MED ORDER — RISAQUAD PO CAPS
2.0000 | ORAL_CAPSULE | Freq: Three times a day (TID) | ORAL | Status: DC
  Administered 2022-06-26 – 2022-06-27 (×3): 2 via ORAL
  Filled 2022-06-26 (×4): qty 2

## 2022-06-26 MED ORDER — DEXTROSE 50 % IV SOLN
1.0000 | Freq: Once | INTRAVENOUS | Status: AC
  Administered 2022-06-26: 50 mL via INTRAVENOUS
  Filled 2022-06-26: qty 50

## 2022-06-26 MED ORDER — ACETAMINOPHEN 325 MG PO TABS: 650.0000 mg | ORAL_TABLET | ORAL | Status: DC | PRN

## 2022-06-26 MED ORDER — AMLODIPINE BESYLATE 5 MG PO TABS
5.0000 mg | ORAL_TABLET | Freq: Every day | ORAL | Status: DC
  Administered 2022-06-26: 5 mg via ORAL
  Filled 2022-06-26: qty 1

## 2022-06-26 MED ORDER — POLYETHYLENE GLYCOL 3350 17 G PO PACK: 17.0000 g | PACK | Freq: Every day | ORAL | Status: DC | PRN

## 2022-06-26 MED ORDER — SENNOSIDES-DOCUSATE SODIUM 8.6-50 MG PO TABS: 1.0000 | ORAL_TABLET | Freq: Every evening | ORAL | Status: DC | PRN

## 2022-06-26 MED ORDER — STROKE: EARLY STAGES OF RECOVERY BOOK
Freq: Once | Status: AC
  Filled 2022-06-26: qty 1

## 2022-06-26 MED ORDER — APIXABAN 5 MG PO TABS
5.0000 mg | ORAL_TABLET | Freq: Two times a day (BID) | ORAL | Status: DC
  Administered 2022-06-26 – 2022-06-27 (×2): 5 mg via ORAL
  Filled 2022-06-26 (×2): qty 1

## 2022-06-26 MED ORDER — NETARSUDIL DIMESYLATE 0.02 % OP SOLN: 1.0000 [drp] | Freq: Every evening | OPHTHALMIC | Status: DC

## 2022-06-26 MED ORDER — SERTRALINE HCL 100 MG PO TABS
100.0000 mg | ORAL_TABLET | Freq: Every day | ORAL | Status: DC
  Administered 2022-06-27: 100 mg via ORAL
  Filled 2022-06-26: qty 1

## 2022-06-26 MED ORDER — ATORVASTATIN CALCIUM 80 MG PO TABS
80.0000 mg | ORAL_TABLET | Freq: Every day | ORAL | Status: DC
  Administered 2022-06-27: 80 mg via ORAL
  Filled 2022-06-26: qty 1

## 2022-06-26 MED ORDER — MIRABEGRON ER 25 MG PO TB24
25.0000 mg | ORAL_TABLET | Freq: Every day | ORAL | Status: DC
  Administered 2022-06-27: 25 mg via ORAL
  Filled 2022-06-26: qty 1

## 2022-06-26 MED ORDER — ACETAMINOPHEN 650 MG RE SUPP: 650.0000 mg | RECTAL | Status: DC | PRN

## 2022-06-26 MED ORDER — ACETAMINOPHEN 160 MG/5ML PO SOLN: 650.0000 mg | ORAL | Status: DC | PRN

## 2022-06-26 MED ORDER — SPIRONOLACTONE 25 MG PO TABS
25.0000 mg | ORAL_TABLET | Freq: Every day | ORAL | Status: DC
  Administered 2022-06-27: 25 mg via ORAL
  Filled 2022-06-26: qty 1

## 2022-06-26 MED ORDER — ORAL CARE MOUTH RINSE: 15.0000 mL | OROMUCOSAL | Status: DC | PRN

## 2022-06-26 NOTE — Plan of Care (Signed)
  Problem: Coping: Goal: Will verbalize positive feelings about self Outcome: Progressing   

## 2022-06-26 NOTE — ED Triage Notes (Signed)
Per [facility] pt coming from- family states patient has not been acting herself over the past 2 days. Family usually has patient on a purewick at home and states patient has been taking it out and wondering around the house. Family states she has frequent UTIs. Has not taken her morning meds.

## 2022-06-26 NOTE — ED Notes (Signed)
ED TO INPATIENT HANDOFF REPORT  ED Nurse Name and Phone #:  Theophilus Bones [phone]  S Name/Age/Gender Yvonne Bailey 82 y.o. female Room/Bed: 028C/028C  Code Status   Code Status: Full Code  Home/SNF/Other Home Patient oriented to: self Is this baseline? Yes   Triage Complete: Triage complete  Chief Complaint TIA (transient ischemic attack) [G45.9]  Triage Note Per [facility] pt coming from- family states patient has not been acting herself over the past 2 days. Family usually has patient on a purewick at home and states patient has been taking it out and wondering around the house. Family states she has frequent UTIs. Has not taken her morning meds.    Allergies Allergies  Allergen Reactions   Definity [Perflutren Lipid Microsphere] Other (See Comments)    Patient experienced back pain with injection   Pletal [Cilostazol] Other (See Comments)    Made pt "feel funny"    Level of Care/Admitting Diagnosis ED Disposition     ED Disposition  Admit   Condition  --   Comment  Hospital Area: [facility] [[id number]]  Level of Care: Telemetry Medical [104]  May place patient in observation at [facility] or [facility] if equivalent level of care is available:: No  Covid Evaluation: Asymptomatic - no recent exposure (last 10 days) testing not required  Diagnosis: TIA (transient ischemic attack) [875643]  Admitting Physician: Emeline General [[id number]]  Attending Physician: Emeline General [[id number]]          B Medical/Surgery History Past Medical History:  Diagnosis Date   Bilateral leg edema 07/19/2015   CAD (coronary artery disease)    a. anterior MI s/p PCI in 1992. b. cath 08/2015 with severe three-vessel CAD turned down for CABG and underwent DESx5 to prox Cx/OM2/D1/oRCA/mRCA   Carotid artery disease    a. carotid duplex 03/2015 showed 1-39% BICA, normal subclavian arteries, chronically occluded left vertebral, f/u recommended only PRN.   DVT (deep venous  thrombosis)    X1   History of nuclear stress test    a. Myoview 6/17: EF 20-25%, mid anteroseptal, apical anterior, apical septal, apical inferior, apical lateral and apical scar, no ischemia, intermediate risk   HTN (hypertension)    Hyperlipidemia    Hypokalemia    Hypothyroidism    Ischemic cardiomyopathy    a. Echo 6/17: EF 20-25%, apex appears akinetic, MAC, moderate MR, moderate LAE, mild RVE, trivial PI, PASP 47 mmHg (needs repeat with Definity contrast).  b. Limited echo with Definity contrast 7/17: EF 25-30%, moderate to severe LAE. c. Limited Echo 2018 showed EF 40-45%.   Lacunar infarction 12/17/2012   Dr Terrace Arabia, Neurology    Leukocytosis    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   Longstanding persistent atrial fibrillation    MI (myocardial infarction) 1992   PAD (peripheral artery disease)    Right SFA occlusion, severe disease left CFA and SFA   Prediabetes 07/28/2016   Stage 3 chronic kidney disease    Stroke    a. 02/2017 in setting of noncompliance with Eliquis   TIA (transient ischemic attack)    Tricuspid regurgitation    Past Surgical History:  Procedure Laterality Date   APPENDECTOMY     at hysterectomy and USO for fibroids, Dr. Kizzie Bane   CARDIAC CATHETERIZATION  1992   Dr Charlies Constable   CARDIAC CATHETERIZATION N/A 09/06/2015   Procedure: Right/Left Heart Cath and Coronary Angiography;  Surgeon: Kathleene Hazel, MD;  Location: [facility] INVASIVE CV LAB;  Service: Cardiovascular;  Laterality: N/A;   CARDIAC CATHETERIZATION N/A 09/07/2015   Procedure: Coronary Stent Intervention;  Surgeon: Kathleene Hazel, MD;  Location: [facility] INVASIVE CV LAB;  Service: Cardiovascular;  Laterality: N/A;   COLONOSCOPY     negative; 2008, Dr. Lina Sar   fracture LLE     '94; pinned   IR ANGIO INTRA EXTRACRAN SEL COM CAROTID INNOMINATE BILAT MOD SED  03/02/2017   IR ANGIO INTRA EXTRACRAN SEL COM CAROTID INNOMINATE BILAT MOD SED  04/23/2018   IR ANGIO VERTEBRAL SEL SUBCLAVIAN  INNOMINATE BILAT MOD SED  04/23/2018   IR ANGIO VERTEBRAL SEL VERTEBRAL UNI R MOD SED  03/02/2017   IR RADIOLOGIST EVAL & MGMT  04/28/2017   IR US GUIDE VASC ACCESS RIGHT  04/23/2018   RADIOLOGY WITH ANESTHESIA N/A 03/02/2017   Procedure: RADIOLOGY WITH ANESTHESIA;  Surgeon: Julieanne Cotton, MD;  Location: [facility] OR;  Service: Radiology;  Laterality: N/A;   TEE WITHOUT CARDIOVERSION N/A 11/02/2020   Procedure: TRANSESOPHAGEAL ECHOCARDIOGRAM (TEE);  Surgeon: Thurmon Fair, MD;  Location: [facility] ENDOSCOPY;  Service: Cardiovascular;  Laterality: N/A;   TONSILLECTOMY     TOTAL ABDOMINAL HYSTERECTOMY     & BSO for fibroids     A IV Location/Drains/Wounds Patient Lines/Drains/Airways Status     Active Line/Drains/Airways     Name Placement date Placement time Site Days   Peripheral IV 06/26/22 20 G 1" Right Antecubital 06/26/22  1032  Antecubital  less than 1            Intake/Output Last 24 hours No intake or output data in the 24 hours ending 06/26/22 1513  Labs/Imaging Results for orders placed or performed during the hospital encounter of 06/26/22 (from the past 48 hour(s))  POC CBG, ED     Status: Abnormal   Collection Time: 06/26/22  9:33 AM  Result Value Ref Range   Glucose-Capillary 68 (L) 70 - 99 mg/dL    Comment: Glucose reference range applies only to samples taken after fasting for at least 8 hours.  CBC with Differential     Status: None   Collection Time: 06/26/22 10:25 AM  Result Value Ref Range   WBC 7.3 4.0 - 10.5 K/uL   RBC 4.08 3.87 - 5.11 MIL/uL   Hemoglobin 12.9 12.0 - 15.0 g/dL   HCT 40.9 81.1 - 91.4 %   MCV 99.8 80.0 - 100.0 fL   MCH 31.6 26.0 - 34.0 pg   MCHC 31.7 30.0 - 36.0 g/dL   RDW 78.2 95.6 - 21.3 %   Platelets 235 150 - 400 K/uL   nRBC 0.0 0.0 - 0.2 %   Neutrophils Relative % 63 %   Neutro Abs 4.6 1.7 - 7.7 K/uL   Lymphocytes Relative 26 %   Lymphs Abs 1.9 0.7 - 4.0 K/uL   Monocytes Relative 7 %   Monocytes Absolute 0.5 0.1 - 1.0 K/uL    Eosinophils Relative 3 %   Eosinophils Absolute 0.2 0.0 - 0.5 K/uL   Basophils Relative 1 %   Basophils Absolute 0.1 0.0 - 0.1 K/uL   Immature Granulocytes 0 %   Abs Immature Granulocytes 0.01 0.00 - 0.07 K/uL    Comment: Performed at [facility], [address], [place], [place] [address]  Comprehensive metabolic panel     Status: Abnormal   Collection Time: 06/26/22 10:25 AM  Result Value Ref Range   Sodium 140 135 - 145 mmol/L   Potassium 4.6 3.5 - 5.1 mmol/L  Chloride 105 98 - 111 mmol/L   CO2 26 22 - 32 mmol/L   Glucose, Bld 88 70 - 99 mg/dL    Comment: Glucose reference range applies only to samples taken after fasting for at least 8 hours.   BUN 20 8 - 23 mg/dL   Creatinine, Ser 2.95 (H) 0.44 - 1.00 mg/dL   Calcium 9.3 8.9 - 62.1 mg/dL   Total Protein 6.9 6.5 - 8.1 g/dL   Albumin 3.5 3.5 - 5.0 g/dL   AST 27 15 - 41 U/L   ALT 19 0 - 44 U/L   Alkaline Phosphatase 108 38 - 126 U/L   Total Bilirubin 0.6 0.3 - 1.2 mg/dL   GFR, Estimated 50 (L) >60 mL/min    Comment: (NOTE) Calculated using the CKD-EPI Creatinine Equation (2021)    Anion gap 9 5 - 15    Comment: Performed at [facility], [address] [place], [place] [address]  Lactic acid, plasma     Status: None   Collection Time: 06/26/22 10:25 AM  Result Value Ref Range   Lactic Acid, Venous 0.8 0.5 - 1.9 mmol/L    Comment: Performed at [facility], [address], [place], [place] [address]  Ammonia     Status: None   Collection Time: 06/26/22 10:25 AM  Result Value Ref Range   Ammonia 28 9 - 35 umol/L    Comment: HEMOLYSIS AT THIS LEVEL MAY AFFECT RESULT Performed at [facility], [address], [place], [place] [address]   TSH     Status: Abnormal   Collection Time: 06/26/22 10:25 AM  Result Value Ref Range   TSH 12.019 (H) 0.350 - 4.500 uIU/mL    Comment: Performed by a 3rd Generation assay with a functional sensitivity of <=0.01 uIU/mL. Performed at [facility],  [address], [place], [place] [address]   I-Stat venous blood gas, [facility], [facility], [facility])     Status: Abnormal   Collection Time: 06/26/22 11:01 AM  Result Value Ref Range   pH, Ven 7.331 7.25 - 7.43   pCO2, Ven 51.4 44 - 60 mmHg   pO2, Ven 68 (H) 32 - 45 mmHg   Bicarbonate 27.1 20.0 - 28.0 mmol/L   TCO2 29 22 - 32 mmol/L   O2 Saturation 92 %   Acid-Base Excess 0.0 0.0 - 2.0 mmol/L   Sodium 140 135 - 145 mmol/L   Potassium 4.6 3.5 - 5.1 mmol/L   Calcium, Ion 1.19 1.15 - 1.40 mmol/L   HCT 41.0 36.0 - 46.0 %   Hemoglobin 13.9 12.0 - 15.0 g/dL   Sample type VENOUS   Lactic acid, plasma     Status: None   Collection Time: 06/26/22 11:19 AM  Result Value Ref Range   Lactic Acid, Venous 0.8 0.5 - 1.9 mmol/L    Comment: Performed at [name], [address] [place], [place] [address]  Urinalysis, Routine w reflex microscopic -Urine, Clean Catch     Status: Abnormal   Collection Time: 06/26/22 12:21 PM  Result Value Ref Range   Color, Urine STRAW (A) YELLOW   APPearance CLEAR CLEAR   Specific Gravity, Urine 1.009 1.005 - 1.030   pH 7.0 5.0 - 8.0   Glucose, UA 50 (A) NEGATIVE mg/dL   Hgb urine dipstick NEGATIVE NEGATIVE   Bilirubin Urine NEGATIVE NEGATIVE   Ketones, ur NEGATIVE NEGATIVE mg/dL   Protein, ur NEGATIVE NEGATIVE mg/dL  Nitrite NEGATIVE NEGATIVE   Leukocytes,Ua MODERATE (A) NEGATIVE   RBC / HPF 0-5 0 - 5 RBC/hpf   WBC, UA 11-20 0 - 5 WBC/hpf   Bacteria, UA NONE SEEN NONE SEEN   Squamous Epithelial / HPF 0-5 0 - 5 /HPF    Comment: Performed at [facility], [address], [place], [place] [address]   DG Chest Portable 1 View  Result Date: 06/26/2022 CLINICAL DATA:  Altered mental status EXAM: PORTABLE CHEST 1 VIEW COMPARISON:  02/28/2022. FINDINGS: Cardiac silhouette is prominent. There is a possible nodule that appears to be superimposed with wires in the right base. No pneumothorax. Normal pulmonary vasculature. No definite pleural effusion. Osseous  structures grossly intact. IMPRESSION: Enlarged cardiac silhouette. Possible nodule in the right base which was obscured by overlying opacities. Electronically Signed   By: Layla Maw M.D.   On: 06/26/2022 10:47   CT HEAD WO CONTRAST ( )  Result Date: 06/26/2022 CLINICAL DATA:  Altered mental status EXAM: CT HEAD WITHOUT CONTRAST TECHNIQUE: Contiguous axial images were obtained from the base of the skull through the vertex without intravenous contrast. RADIATION DOSE REDUCTION: This exam was performed according to the departmental dose-optimization program which includes automated exposure control, adjustment of the mA and/or kV according to patient size and/or use of iterative reconstruction technique. COMPARISON:  CT head 04/23/22 FINDINGS: Brain: Redemonstrated chronic left parietal lobe and right frontal lobe/basal ganglia infarcts. There is also a chronic appearing infarct in the left corona radiata. Chronic bilateral cerebellar infarcts. No hemorrhage. No hydrocephalus. No extra-axial fluid collection. There is mild ex vacuo dilatation of the left occipital horn. Vascular: No hyperdense vessel or unexpected calcification. Skull: Normal. Negative for fracture or focal lesion. Sinuses/Orbits: No middle ear or mastoid effusion. Paranasal sinuses are clear. Orbits are unremarkable. Other: None. IMPRESSION: 1. No acute intracranial abnormality. 2. Sequela of severe chronic microvascular ischemic change with multiple chronic infarcts, as above. Electronically Signed   By: Lorenza Cambridge M.D.   On: 06/26/2022 10:13    Pending Labs Unresulted Labs (From admission, onward)     Start     Ordered   06/27/22 0500  Lipid panel  (Labs)  Tomorrow morning,   R       Comments: Fasting    06/26/22 1501   06/26/22 0922  Urine Culture  Once,   URGENT       Question:  Indication  Answer:  Altered mental status (if no other cause identified)   06/26/22 0922            Vitals/Pain Today's Vitals    06/26/22 1200 06/26/22 1230 06/26/22 1300 06/26/22 1427  BP: (!) 171/87 (!) 175/77 (!) 179/95   Pulse: 62 (!) 52 71   Resp: Temp:    98.5 F (36.9 C)  TempSrc:    Oral  SpO2: 98% 99% 100%   Weight:      Height:        Isolation Precautions No active isolations  Medications Medications  atorvastatin (LIPITOR) tablet 80 mg (has no administration in time range)  spironolactone (ALDACTONE) tablet 25 mg (has no administration in time range)  sertraline (ZOLOFT) tablet 100 mg (has no administration in time range)  levothyroxine (SYNTHROID) tablet 100 mcg (has no administration in time range)  acidophilus (RISAQUAD) capsule 2 capsule (has no administration in time range)  polyethylene glycol (MIRALAX / GLYCOLAX) packet 17 g (has no administration in time range)  mirabegron ER (MYRBETRIQ)  tablet 25 mg (has no administration in time range)  apixaban (ELIQUIS) tablet 5 mg (has no administration in time range)  dorzolamide-timolol (COSOPT) 2-0.5 % ophthalmic solution 1 drop (has no administration in time range)  Netarsudil Dimesylate 0.02 % SOLN 1 drop (has no administration in time range)   stroke: early stages of recovery book (has no administration in time range)  acetaminophen (TYLENOL) tablet 650 mg (has no administration in time range)    Or  acetaminophen (TYLENOL) 160 MG/5ML solution 650 mg (has no administration in time range)    Or  acetaminophen (TYLENOL) suppository 650 mg (has no administration in time range)  senna-docusate (Senokot-S) tablet 1 tablet (has no administration in time range)  dextrose 50 % solution 50 mL (50 mLs Intravenous Given 06/26/22 1035)    Mobility walks     Focused Assessments Neuro Assessment Handoff:  Swallow screen pass? Yes  Cardiac Rhythm: Atrial fibrillation       Neuro Assessment: Exceptions to [facility] Neuro Checks:      Has TPA been given? No If patient is a Neuro Trauma and patient is going to OR before floor call report to  4N Charge nurse: [phone] or [phone]   R Recommendations: See Admitting Provider Note  Report given to:   Additional Notes:

## 2022-06-26 NOTE — H&P (Addendum)
History and Physical    Yvonne Bailey NWG:[id number] DOB: [birth year] DOA: 06/26/2022  PCP: Pincus Sanes, MD (Confirm with patient/family/NH records and if not entered, this has to be entered at [facility] point of entry) Patient coming from: Home  I have personally briefly reviewed patient's old medical records in [facility] Link  Chief Complaint: I feel fine  HPI: Yvonne Bailey is a 82 y.o. female with medical history significant of advanced dementia, CAD, PAF on Eliquis, recurrent UTIs, HTN, HLD, CKD stage II, hypothyroidism, brought in by family for evaluation of altered mentations.  Son reported that at baseline, patient at routine, wakes up in the morning and very talkative and active.  This morning, however patient refused to ambulate and not talking.  Son reported that the patient's urine smelled strongly for the last 2 days.  Son also reported that the patient has been using PureWick device at home for about 4-5 weeks "to prevent UTIs"  ED Course: Afebrile, none tachycardia, blood pressure mildly elevated O2 saturation 100% on room air.  CT head negative for acute findings.  UA showed WBC 11-20, negative for nitrite positive for leukocytes.  Blood work glucose 68. TSH=12  Review of Systems: Unable to perform, patient is confused. Past Medical History:  Diagnosis Date   Bilateral leg edema 07/19/2015   CAD (coronary artery disease)    a. anterior MI s/p PCI in 1992. b. cath 08/2015 with severe three-vessel CAD turned down for CABG and underwent DESx5 to prox Cx/OM2/D1/oRCA/mRCA   Carotid artery disease    a. carotid duplex 03/2015 showed 1-39% BICA, normal subclavian arteries, chronically occluded left vertebral, f/u recommended only PRN.   DVT (deep venous thrombosis)    X1   History of nuclear stress test    a. Myoview 6/17: EF 20-25%, mid anteroseptal, apical anterior, apical septal, apical inferior, apical lateral and apical scar, no ischemia, intermediate risk   HTN  (hypertension)    Hyperlipidemia    Hypokalemia    Hypothyroidism    Ischemic cardiomyopathy    a. Echo 6/17: EF 20-25%, apex appears akinetic, MAC, moderate MR, moderate LAE, mild RVE, trivial PI, PASP 47 mmHg (needs repeat with Definity contrast).  b. Limited echo with Definity contrast 7/17: EF 25-30%, moderate to severe LAE. c. Limited Echo 2018 showed EF 40-45%.   Lacunar infarction 12/17/2012   Dr Terrace Arabia, Neurology    Leukocytosis    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   Longstanding persistent atrial fibrillation    MI (myocardial infarction) 1992   PAD (peripheral artery disease)    Right SFA occlusion, severe disease left CFA and SFA   Prediabetes 07/28/2016   Stage 3 chronic kidney disease    Stroke    a. 02/2017 in setting of noncompliance with Eliquis   TIA (transient ischemic attack)    Tricuspid regurgitation     Past Surgical History:  Procedure Laterality Date   APPENDECTOMY     at hysterectomy and USO for fibroids, Dr. Kizzie Bane   CARDIAC CATHETERIZATION  1992   Dr Charlies Constable   CARDIAC CATHETERIZATION N/A 09/06/2015   Procedure: Right/Left Heart Cath and Coronary Angiography;  Surgeon: Kathleene Hazel, MD;  Location: [facility] INVASIVE CV LAB;  Service: Cardiovascular;  Laterality: N/A;   CARDIAC CATHETERIZATION N/A 09/07/2015   Procedure: Coronary Stent Intervention;  Surgeon: Kathleene Hazel, MD;  Location: [facility] INVASIVE CV LAB;  Service: Cardiovascular;  Laterality: N/A;   COLONOSCOPY     negative; 2008, Dr. Lina Sar  fracture LLE     '94; pinned   IR ANGIO INTRA EXTRACRAN SEL COM CAROTID INNOMINATE BILAT MOD SED  03/02/2017   IR ANGIO INTRA EXTRACRAN SEL COM CAROTID INNOMINATE BILAT MOD SED  04/23/2018   IR ANGIO VERTEBRAL SEL SUBCLAVIAN INNOMINATE BILAT MOD SED  04/23/2018   IR ANGIO VERTEBRAL SEL VERTEBRAL UNI R MOD SED  03/02/2017   IR RADIOLOGIST EVAL & MGMT  04/28/2017   IR US GUIDE VASC ACCESS RIGHT  04/23/2018   RADIOLOGY WITH ANESTHESIA N/A  03/02/2017   Procedure: RADIOLOGY WITH ANESTHESIA;  Surgeon: Julieanne Cotton, MD;  Location: [facility] OR;  Service: Radiology;  Laterality: N/A;   TEE WITHOUT CARDIOVERSION N/A 11/02/2020   Procedure: TRANSESOPHAGEAL ECHOCARDIOGRAM (TEE);  Surgeon: Thurmon Fair, MD;  Location: [facility] ENDOSCOPY;  Service: Cardiovascular;  Laterality: N/A;   TONSILLECTOMY     TOTAL ABDOMINAL HYSTERECTOMY     & BSO for fibroids     reports that she quit smoking about 32 years ago. Her smoking use included cigarettes. She has never used smokeless tobacco. She reports that she does not drink alcohol and does not use drugs.  Allergies  Allergen Reactions   Definity [Perflutren Lipid Microsphere] Other (See Comments)    Patient experienced back pain with injection   Pletal [Cilostazol] Other (See Comments)    Made pt "feel funny"    Family History  Problem Relation Age of Onset   Diabetes Mother    Hypertension Mother    Stroke Brother        ?> 27   Coronary artery disease Brother        stent in 66s   Cancer Neg Hx      Prior to Admission medications   Medication Sig Start Date End Date Taking? Authorizing Provider  acidophilus (RISAQUAD) CAPS capsule Take 2 capsules by mouth 3 (three) times daily. 04/30/22   Arnetha Courser, MD  atorvastatin (LIPITOR) 40 MG tablet Take 2 tablets (80 mg total) by mouth daily. 01/16/22   Pincus Sanes, MD  dorzolamide-timolol (COSOPT) 2-0.5 % ophthalmic solution Place 1 drop into both eyes in the morning, at noon, and at bedtime.    [provider]  ELIQUIS 5 MG TABS tablet TAKE 1 TABLET(5 MG) BY MOUTH TWICE DAILY 05/21/22   Pincus Sanes, MD  levothyroxine (SYNTHROID) 100 MCG tablet Take 1 tablet (100 mcg total) by mouth daily. 03/19/22   Pincus Sanes, MD  MYRBETRIQ 25 MG TB24 tablet TAKE 1 TABLET(25 MG) BY MOUTH DAILY 04/30/22   Burns, Bobette Mo, MD  Netarsudil Dimesylate (RHOPRESSA) 0.02 % SOLN Place 1 drop into both eyes every evening.    [provider]   nitroGLYCERIN (NITROSTAT) 0.4 MG SL tablet Place 1 tablet (0.4 mg total) under the tongue every 5 (five) minutes as needed for chest pain. 09/08/15   Little Ishikawa, NP  polyethylene glycol (MIRALAX) 17 g packet Take one dose 3 times a day until bowel clear, maximum of 3 consecutive days. Patient taking differently: Take 17 g by mouth daily as needed for mild constipation. 10/30/20   Elpidio Anis, PA-C  sertraline (ZOLOFT) 100 MG tablet Take 1 tablet (100 mg total) by mouth daily. 05/24/21   Pincus Sanes, MD  spironolactone (ALDACTONE) 25 MG tablet TAKE 1 TABLET(25 MG) BY MOUTH DAILY Patient taking differently: Take 25 mg by mouth daily. 09/26/21   Pincus Sanes, MD    Physical Exam: Vitals:   06/26/22 1200 06/26/22 1230 06/26/22 1300  06/26/22 1427  BP: (!) 171/87 (!) 175/77 (!) 179/95   Pulse: 62 (!) 52 71   Resp: 17 15 10    Temp:    98.5 F (36.9 C)  TempSrc:    Oral  SpO2: 98% 99% 100%   Weight:      Height:        Constitutional: NAD, calm, comfortable Vitals:   06/26/22 1200 06/26/22 1230 06/26/22 1300 06/26/22 1427  BP: (!) 171/87 (!) 175/77 (!) 179/95   Pulse: 62 (!) 52 71   Resp: 17 15 10    Temp:    98.5 F (36.9 C)  TempSrc:    Oral  SpO2: 98% 99% 100%   Weight:      Height:       Eyes: PERRL, lids and conjunctivae normal ENMT: Mucous membranes are moist. Posterior pharynx clear of any exudate or lesions.Normal dentition.  Neck: normal, supple, no masses, no thyromegaly Respiratory: clear to auscultation bilaterally, no wheezing, no crackles. Normal respiratory effort. No accessory muscle use.  Cardiovascular: Regular rate and rhythm, no murmurs / rubs / gallops. No extremity edema. 2+ pedal pulses. No carotid bruits.  Abdomen: no tenderness, no masses palpated. No hepatosplenomegaly. Bowel sounds positive.  Musculoskeletal: no clubbing / cyanosis. No joint deformity upper and lower extremities. Good ROM, no contractures. Normal muscle tone.  Skin: no rashes,  lesions, ulcers. No induration Neurologic: CN 2-12 grossly intact. Sensation intact, DTR normal. Strength 5/5 in all 4.  Psychiatric: Oriented to person and place, confused about time   Labs on Admission: I have personally reviewed following labs and imaging studies  CBC: Recent Labs  Lab 06/26/22 1025 06/26/22 1101  WBC 7.3  --   NEUTROABS 4.6  --   HGB 12.9 13.9  HCT 40.7 41.0  MCV 99.8  --   PLT 235  --    Basic Metabolic Panel: Recent Labs  Lab 06/26/22 1025 06/26/22 1101  NA 140 140  K 4.6 4.6  CL 105  --   CO2 26  --   GLUCOSE 88  --   BUN 20  --   CREATININE 1.11*  --   CALCIUM 9.3  --    GFR: Estimated Creatinine Clearance: 38.7 mL/min (A) (by C-G formula based on SCr of 1.11 mg/dL (H)). Liver Function Tests: Recent Labs  Lab 06/26/22 1025  AST 27  ALT 19  ALKPHOS 108  BILITOT 0.6  PROT 6.9  ALBUMIN 3.5   No results for input(s): "LIPASE", "AMYLASE" in the last 168 hours. Recent Labs  Lab 06/26/22 1025  AMMONIA 28   Coagulation Profile: No results for input(s): "INR", "PROTIME" in the last 168 hours. Cardiac Enzymes: No results for input(s): "CKTOTAL", "CKMB", "CKMBINDEX", "TROPONINI" in the last 168 hours. BNP (last 3 results) No results for input(s): "PROBNP" in the last 8760 hours. HbA1C: No results for input(s): "HGBA1C" in the last 72 hours. CBG: Recent Labs  Lab 06/26/22 0933  GLUCAP 68*   Lipid Profile: No results for input(s): "CHOL", "HDL", "LDLCALC", "TRIG", "CHOLHDL", "LDLDIRECT" in the last 72 hours. Thyroid Function Tests: Recent Labs    06/26/22 1025  TSH 12.019*   Anemia Panel: No results for input(s): "VITAMINB12", "FOLATE", "FERRITIN", "TIBC", "IRON", "RETICCTPCT" in the last 72 hours. Urine analysis:    Component Value Date/Time   COLORURINE STRAW (A) 06/26/2022 1221   APPEARANCEUR CLEAR 06/26/2022 1221   APPEARANCEUR Cloudy (A) 03/27/2017 1332   LABSPEC 1.009 06/26/2022 1221   PHURINE 7.0 06/26/2022 1221  GLUCOSEU 50 (A) 06/26/2022 1221   GLUCOSEU NEGATIVE 05/28/2022 1359   HGBUR NEGATIVE 06/26/2022 1221   HGBUR negative 10/26/2007 1057   BILIRUBINUR NEGATIVE 06/26/2022 1221   BILIRUBINUR negative 12/25/2021 1655   BILIRUBINUR Negative 03/27/2017 1332   KETONESUR NEGATIVE 06/26/2022 1221   PROTEINUR NEGATIVE 06/26/2022 1221   UROBILINOGEN 0.2 05/28/2022 1359   NITRITE NEGATIVE 06/26/2022 1221   LEUKOCYTESUR MODERATE (A) 06/26/2022 1221    Radiological Exams on Admission: DG Chest Portable 1 View  Result Date: 06/26/2022 CLINICAL DATA:  Altered mental status EXAM: PORTABLE CHEST 1 VIEW COMPARISON:  02/28/2022. FINDINGS: Cardiac silhouette is prominent. There is a possible nodule that appears to be superimposed with wires in the right base. No pneumothorax. Normal pulmonary vasculature. No definite pleural effusion. Osseous structures grossly intact. IMPRESSION: Enlarged cardiac silhouette. Possible nodule in the right base which was obscured by overlying opacities. Electronically Signed   By: Layla Maw M.D.   On: 06/26/2022 10:47   CT HEAD WO CONTRAST ( )  Result Date: 06/26/2022 CLINICAL DATA:  Altered mental status EXAM: CT HEAD WITHOUT CONTRAST TECHNIQUE: Contiguous axial images were obtained from the base of the skull through the vertex without intravenous contrast. RADIATION DOSE REDUCTION: This exam was performed according to the departmental dose-optimization program which includes automated exposure control, adjustment of the mA and/or kV according to patient size and/or use of iterative reconstruction technique. COMPARISON:  CT head 04/23/22 FINDINGS: Brain: Redemonstrated chronic left parietal lobe and right frontal lobe/basal ganglia infarcts. There is also a chronic appearing infarct in the left corona radiata. Chronic bilateral cerebellar infarcts. No hemorrhage. No hydrocephalus. No extra-axial fluid collection. There is mild ex vacuo dilatation of the left occipital horn.  Vascular: No hyperdense vessel or unexpected calcification. Skull: Normal. Negative for fracture or focal lesion. Sinuses/Orbits: No middle ear or mastoid effusion. Paranasal sinuses are clear. Orbits are unremarkable. Other: None. IMPRESSION: 1. No acute intracranial abnormality. 2. Sequela of severe chronic microvascular ischemic change with multiple chronic infarcts, as above. Electronically Signed   By: Lorenza Cambridge M.D.   On: 06/26/2022 10:13    EKG: Independently reviewed.  Rate controlled A-fib  Assessment/Plan Principal Problem:   TIA (transient ischemic attack) Active Problems:   Atrial fibrillation   Acute metabolic encephalopathy  (please populate well all problems here in Problem List. (For example, if patient is on BP meds at home and you resume or decide to hold them, it is a problem that needs to be her. Same for CAD, COPD, HLD and so on)  Acute encephalopathy -Unknown etiology, UTI less likely given UA shows. -TSH elevated, however patient already on levothyroxine rather high dose, will check T3, given there is no significant severe mentation deterioration, plan to keep patient on the same dosage of levothyroxine for now and recheck thyroid profile in 4 to 5 weeks. -Other DDx, MRI pending to rule out stroke, considered clinically considered to be less likely given there is no focal neurodeficit. -Hypoglycemia discussed as below  Borderline hypoglycemia -Relatively asymptomatic, glucose level appears to be at baseline and A1c has been 5.2-5.3 since last year.  Unlikely borderline hypoglycemia to cause today's symptoms.  History of recurrent UTIs -UA appears to be clean, hold off antibiotics -Discussed with son regarding precaution of continued use of PureWick. Family unmoved. I encouraged son to talk this issue with patient's PCP, who agreed.  PAF -Rate controled -Continue Eliquis  HTN -Stable  CKD stage II -Euvolemic, creatinine level stable  Advanced  dementia -Son  reported that patient is able to complete most of her daily activities.  [facility] and with a home aide  DVT prophylaxis: Eliquis Code Status: Full code Family Communication: Son over the phone Disposition Plan: Expect less than 2 midnight hospital stay Consults called: None Admission status: Tele obs   Emeline General MD [facility] Pager [phone]  06/26/2022, 3:24 PM

## 2022-06-26 NOTE — ED Provider Notes (Signed)
[place] EMERGENCY DEPARTMENT AT [facility] Provider Note   CSN: [id number] Arrival date & time: 06/26/22  1324     History  Chief Complaint  Patient presents with   Altered Mental Status    Yvonne Bailey is a 82 y.o. female.  The history is provided by a relative and medical records. The history is limited by the condition of the patient.  Altered Mental Status Presenting symptoms: behavior changes, confusion and partial responsiveness   Severity:  Severe Most recent episode:  2 days ago Episode history:  Continuous Timing:  Constant Progression:  Worsening Chronicity:  Recurrent Context: recent infection (recent UTI several wweks ago)   Associated symptoms: decreased appetite   Associated symptoms: no abdominal pain, no agitation, no fever, no headaches, no rash, no seizures and no vomiting     LVL 5 caveat for AMS and not answering questions     Home Medications Prior to Admission medications   Medication Sig Start Date End Date Taking? Authorizing Provider  acidophilus (RISAQUAD) CAPS capsule Take 2 capsules by mouth 3 (three) times daily. 04/30/22   Arnetha Courser, MD  atorvastatin (LIPITOR) 40 MG tablet Take 2 tablets (80 mg total) by mouth daily. 01/16/22   Pincus Sanes, MD  dorzolamide-timolol (COSOPT) 2-0.5 % ophthalmic solution Place 1 drop into both eyes in the morning, at noon, and at bedtime.    [provider]  ELIQUIS 5 MG TABS tablet TAKE 1 TABLET(5 MG) BY MOUTH TWICE DAILY 05/21/22   Pincus Sanes, MD  levothyroxine (SYNTHROID) 100 MCG tablet Take 1 tablet (100 mcg total) by mouth daily. 03/19/22   Pincus Sanes, MD  MYRBETRIQ 25 MG TB24 tablet TAKE 1 TABLET(25 MG) BY MOUTH DAILY 04/30/22   Burns, Bobette Mo, MD  Netarsudil Dimesylate (RHOPRESSA) 0.02 % SOLN Place 1 drop into both eyes every evening.    [provider]  nitroGLYCERIN (NITROSTAT) 0.4 MG SL tablet Place 1 tablet (0.4 mg total) under the tongue every 5 (five)  minutes as needed for chest pain. 09/08/15   Little Ishikawa, NP  polyethylene glycol (MIRALAX) 17 g packet Take one dose 3 times a day until bowel clear, maximum of 3 consecutive days. Patient taking differently: Take 17 g by mouth daily as needed for mild constipation. 10/30/20   Elpidio Anis, PA-C  sertraline (ZOLOFT) 100 MG tablet Take 1 tablet (100 mg total) by mouth daily. 05/24/21   Pincus Sanes, MD  spironolactone (ALDACTONE) 25 MG tablet TAKE 1 TABLET(25 MG) BY MOUTH DAILY Patient taking differently: Take 25 mg by mouth daily. 09/26/21   Pincus Sanes, MD      Allergies    Definity [perflutren lipid microsphere] and Pletal [cilostazol]    Review of Systems   Review of Systems  Unable to perform ROS: Mental status change  Constitutional:  Positive for decreased appetite. Negative for chills and fever.  HENT:  Negative for congestion.   Respiratory:  Negative for cough.   Cardiovascular:  Negative for leg swelling.  Gastrointestinal:  Negative for abdominal pain, constipation, diarrhea and vomiting.  Genitourinary:  Positive for decreased urine volume (darkened and ckoudy urine per family).  Skin:  Negative for rash.  Neurological:  Negative for seizures and headaches.  Psychiatric/Behavioral:  Positive for confusion. Negative for agitation.     Physical Exam Updated Vital Signs BP (!) 182/106   Pulse 69   Temp 97.6 F (36.4 C) (Oral)   Resp 18  Ht 5\' 7"  (1.702 m)   Wt 73.5 kg   SpO2 100%   BMI 25.37 kg/m  Physical Exam Vitals and nursing note reviewed.  Constitutional:      General: She is not in acute distress.    Appearance: She is well-developed. She is not ill-appearing, toxic-appearing or diaphoretic.  HENT:     Head: Normocephalic and atraumatic.     Nose: Nose normal.     Mouth/Throat:     Mouth: Mucous membranes are moist.     Pharynx: No oropharyngeal exudate or posterior oropharyngeal erythema.  Eyes:     Conjunctiva/sclera: Conjunctivae normal.      Pupils: Pupils are equal, round, and reactive to light.  Cardiovascular:     Rate and Rhythm: Normal rate. Rhythm irregular.     Heart sounds: No murmur heard. Pulmonary:     Effort: Pulmonary effort is normal. No respiratory distress.     Breath sounds: Normal breath sounds. No stridor. No wheezing, rhonchi or rales.  Chest:     Chest wall: No tenderness.  Abdominal:     General: Abdomen is flat.     Palpations: Abdomen is soft.     Tenderness: There is no abdominal tenderness. There is no guarding or rebound.  Musculoskeletal:        General: No swelling or tenderness.     Cervical back: Neck supple. No tenderness.     Right lower leg: No edema.     Left lower leg: No edema.  Skin:    General: Skin is warm and dry.     Capillary Refill: Capillary refill takes less than 2 seconds.     Findings: No erythema or rash.  Neurological:     GCS: GCS eye subscore is 4. GCS verbal subscore is 1. GCS motor subscore is 5.     Sensory: No sensory deficit.     Comments: Patient responded to painful stimuli in all extremities.  Otherwise she was not following commands with extremities.  Appear to have sensation in all extremities     ED Results / Procedures / Treatments   Labs (all labs ordered are listed, but only abnormal results are displayed) Labs Reviewed  COMPREHENSIVE METABOLIC PANEL - Abnormal; Notable for the following components:      Result Value   Creatinine, Ser 1.11 (*)    GFR, Estimated 50 (*)    All other components within normal limits  URINALYSIS, ROUTINE W REFLEX MICROSCOPIC - Abnormal; Notable for the following components:   Color, Urine STRAW (*)    Glucose, UA 50 (*)    Leukocytes,Ua MODERATE (*)    All other components within normal limits  TSH - Abnormal; Notable for the following components:   TSH 12.019 (*)    All other components within normal limits  CBG MONITORING, ED - Abnormal; Notable for the following components:   Glucose-Capillary 68 (*)    All  other components within normal limits  I-STAT VENOUS BLOOD GAS, ED - Abnormal; Notable for the following components:   pO2, Ven 68 (*)    All other components within normal limits  URINE CULTURE  CBC WITH DIFFERENTIAL/PLATELET  LACTIC ACID, PLASMA  LACTIC ACID, PLASMA  AMMONIA    EKG EKG Interpretation  Date/Time:  Wednesday June 26 2022 08:32:47 EDT Ventricular Rate:  71 PR Interval:    QRS Duration: 109 QT Interval:  444 QTC Calculation: 483 R Axis:   -27 Text Interpretation: Atrial fibrillation LVH with secondary repolarization abnormality  Anterior infarct, old when compared to prior, overall similar appearance. No STEMI Confirmed by Theda Belfast ([id number]) on 06/26/2022 9:22:26 AM  Radiology DG Chest Portable 1 View  Result Date: 06/26/2022 CLINICAL DATA:  Altered mental status EXAM: PORTABLE CHEST 1 VIEW COMPARISON:  02/28/2022. FINDINGS: Cardiac silhouette is prominent. There is a possible nodule that appears to be superimposed with wires in the right base. No pneumothorax. Normal pulmonary vasculature. No definite pleural effusion. Osseous structures grossly intact. IMPRESSION: Enlarged cardiac silhouette. Possible nodule in the right base which was obscured by overlying opacities. Electronically Signed   By: Layla Maw M.D.   On: 06/26/2022 10:47   CT HEAD WO CONTRAST ( )  Result Date: 06/26/2022 CLINICAL DATA:  Altered mental status EXAM: CT HEAD WITHOUT CONTRAST TECHNIQUE: Contiguous axial images were obtained from the base of the skull through the vertex without intravenous contrast. RADIATION DOSE REDUCTION: This exam was performed according to the departmental dose-optimization program which includes automated exposure control, adjustment of the mA and/or kV according to patient size and/or use of iterative reconstruction technique. COMPARISON:  CT head 04/23/22 FINDINGS: Brain: Redemonstrated chronic left parietal lobe and right frontal lobe/basal ganglia infarcts.  There is also a chronic appearing infarct in the left corona radiata. Chronic bilateral cerebellar infarcts. No hemorrhage. No hydrocephalus. No extra-axial fluid collection. There is mild ex vacuo dilatation of the left occipital horn. Vascular: No hyperdense vessel or unexpected calcification. Skull: Normal. Negative for fracture or focal lesion. Sinuses/Orbits: No middle ear or mastoid effusion. Paranasal sinuses are clear. Orbits are unremarkable. Other: None. IMPRESSION: 1. No acute intracranial abnormality. 2. Sequela of severe chronic microvascular ischemic change with multiple chronic infarcts, as above. Electronically Signed   By: Lorenza Cambridge M.D.   On: 06/26/2022 10:13    Procedures Procedures    Medications Ordered in ED Medications  atorvastatin (LIPITOR) tablet 80 mg (has no administration in time range)  spironolactone (ALDACTONE) tablet 25 mg (has no administration in time range)  sertraline (ZOLOFT) tablet 100 mg (has no administration in time range)  levothyroxine (SYNTHROID) tablet 100 mcg (has no administration in time range)  acidophilus (RISAQUAD) capsule 2 capsule (has no administration in time range)  polyethylene glycol (MIRALAX / GLYCOLAX) packet 17 g (has no administration in time range)  mirabegron ER (MYRBETRIQ) tablet 25 mg (has no administration in time range)  apixaban (ELIQUIS) tablet 5 mg (has no administration in time range)  dorzolamide-timolol (COSOPT) 2-0.5 % ophthalmic solution 1 drop (has no administration in time range)  Netarsudil Dimesylate 0.02 % SOLN 1 drop (has no administration in time range)   stroke: early stages of recovery book (has no administration in time range)  acetaminophen (TYLENOL) tablet 650 mg (has no administration in time range)    Or  acetaminophen (TYLENOL) 160 MG/5ML solution 650 mg (has no administration in time range)    Or  acetaminophen (TYLENOL) suppository 650 mg (has no administration in time range)  senna-docusate  (Senokot-S) tablet 1 tablet (has no administration in time range)  dextrose 50 % solution 50 mL (50 mLs Intravenous Given 06/26/22 1035)    ED Course/ Medical Decision Making/ A&P                             Medical Decision Making Amount and/or Complexity of Data Reviewed Labs: ordered. Radiology: ordered.  Risk Prescription drug management. Decision regarding hospitalization.    AVERILL WINTERS is a 82 y.o. female  with a past medical history significant for atrial fibrillation on Eliquis therapy, hypertension, CKD, dyslipidemia, previous DVT, CHF, previous strokes, frequent UTIs with altered mental status, and CAD who presents for altered mental status.  According to son, [name], whom I spoke with on the phone and lives with the patient, she has been acting altered for the last 2 days that has been worsening.  He reports he normally lays in bed but instead was getting up and walking around without her walker although he does not report any falls.  She normally is talkative but was not talking as much the last day or 2 and her urine is looking more infected with looking darker, concentrated, cloudy.  He reports she has been encephalopathic and confused with previous UTIs and is concerned this is similar.  It appears that several weeks ago she had similar altered mental status related to UTI and was treated with antibiotics.  He reports she is otherwise not complained of fevers, chills, cough, nausea, vomiting, or bowel changes.  He does not report any trauma but with her minimally responding and not answering questions today he sent her in for evaluation.  On exam, patient responded to pain in all 4 extremities and will watch me with open eyes but otherwise is not answering questions.  Lungs were clear and chest was nontender.  Abdomen nontender.  Neck did not appear tender on my exam.  Patient appeared well-hydrated and did not have dry mucous membranes initially.  Patient was  hypertensive but otherwise was not tachycardic, hypoxic, and was afebrile.  Patient appearing to rest comfortably.  Does not appear to be responding to external stimuli and son reported she did not seem to be hallucinating.  Clinically it sounds like she is encephalopathic related to recurrent UTI given the reported urinary changes per family however, given her ambulating without her walker, being on blood thinners, and history of strokes I do feel need to get CT head first to make sure she does not have some spontaneous bleed.  Will get screening labs and urinalysis and due to her altered mental status from baseline anticipate admission when workup is completed.  2:04 PM Urinalysis just returned without nitrites or bacteria.  At this time doubt UTI.  Ammonia normal.  Metabolic panel overall reassuring with only elevation of creatinine to 1.11.  Glucose was slightly low than it was given some D50.  She is not acutely acidotic at this time.  CBC reassuring.  Chest x-ray did not show pneumonia.  As patient is altered and not answering questions and acting very different per family, will get MRI but due to altered mental status I do anticipate admission after MRI is completed.  TSH slightly elevated but it is similar to what has been in the past.  Anticipate admission after MRI.  Medicine was called for admission for persistent altered mental status.  MRI was ordered.        Final Clinical Impression(s) / ED Diagnoses Final diagnoses:  Altered mental status, unspecified altered mental status type    Rx / DC Orders ED Discharge Orders     None      Clinical Impression: 1. Altered mental status, unspecified altered mental status type     Disposition: Admit  This note was prepared with assistance of Dragon voice recognition software. Occasional wrong-word or sound-a-like substitutions may have occurred due to the inherent limitations of voice recognition software.     Nikisha Fleece,  Canary Brim, MD 06/26/22 [phone]

## 2022-06-27 DIAGNOSIS — G9341 Metabolic encephalopathy: Secondary | ICD-10-CM | POA: Diagnosis not present

## 2022-06-27 LAB — LIPID PANEL
Cholesterol: 128 mg/dL (ref 0–200)
HDL: 45 mg/dL (ref >40)
LDL Cholesterol: 72 mg/dL (ref 0–99)
Total CHOL/HDL Ratio: 2.8 RATIO
Triglycerides: 53 mg/dL (ref <150)
VLDL: 11 mg/dL (ref 0–40)

## 2022-06-27 LAB — URINE CULTURE

## 2022-06-27 MED ORDER — LEVOTHYROXINE SODIUM 112 MCG PO TABS: 112.0000 ug | ORAL_TABLET | Freq: Every day | ORAL | 0 refills | Status: DC

## 2022-06-27 MED ORDER — ASPIRIN 81 MG PO TBEC
81.0000 mg | DELAYED_RELEASE_TABLET | Freq: Every day | ORAL | Status: DC
  Administered 2022-06-27: 81 mg via ORAL
  Filled 2022-06-27: qty 1

## 2022-06-27 MED ORDER — LEVOTHYROXINE SODIUM 112 MCG PO TABS: 112.0000 ug | ORAL_TABLET | Freq: Every day | ORAL | Status: DC

## 2022-06-27 MED ORDER — LEVOTHYROXINE SODIUM 25 MCG PO TABS
12.5000 ug | ORAL_TABLET | Freq: Once | ORAL | Status: AC
  Administered 2022-06-27: 12.5 ug via ORAL
  Filled 2022-06-27: qty 1

## 2022-06-27 MED ORDER — LATANOPROST 0.005 % OP SOLN
1.0000 [drp] | Freq: Every day | OPHTHALMIC | Status: DC
  Filled 2022-06-27: qty 2.5

## 2022-06-27 NOTE — Discharge Summary (Signed)
Physician Discharge Summary   Patient: Yvonne Bailey MRN: [id number] DOB: [birth year]  Admit date:     06/26/2022  Discharge date: 06/27/22  Discharge Physician: Lynden Oxford  PCP: Pincus Sanes, MD  Recommendations at discharge: Follow-up with PCP in 1 week. Repeat TSH and free T4 in 1 month.   Follow-up Information     Pincus Sanes, MD. Schedule an appointment as soon as possible for a visit in 1 week(s).   Specialty: Internal Medicine Why: Check TSH, Free T4 in 1 month. Contact information: [address] [place] [address] [phone]                Discharge Diagnoses: Principal Problem:   Acute metabolic encephalopathy Active Problems:   Hypothyroidism   Dyslipidemia   Essential hypertension   Peripheral vascular disease (HCC)   Bilateral leg edema   Atrial fibrillation   Chronic systolic heart failure   Anxiety and depression  Assessment and Plan  Acute metabolic encephalopathy 0. Secondary to hypothyroidism. TSH is significantly elevated. Free T4 is normal. Suspect that this is secondary to not taking Synthroid accurately. Given her presentation with confusion I will increase the dose to 112 mcg. Recommended son to take Synthroid before going to the senior care center in the morning. Recommend recheck TSH free T4 in 1 month. No evidence of UTI. MRI brain negative for any acute stroke. Mentation improved on its own without any agitation or confusion. Currently adequate as well. PT OT recommended outpatient therapy.  Paroxysmal A-fib. Currently rate controlled.  Continue Eliquis.  HTN. Continue home regimen.  CKD stage II. Serum creatinine stable. Monitor.  Dementia. No agitation or behavioral issues. Monitor.  Recurrent UTI. Uses PureWick at home. Do not think that she Is UTI right now.  Consultants:  none  Procedures performed:  noen  DISCHARGE MEDICATION: Allergies as of 06/27/2022       Reactions   Definity  [perflutren Lipid Microsphere] Other (See Comments)   Patient experienced back pain with injection   Pletal [cilostazol] Other (See Comments)   Made pt "feel funny"        Medication List     TAKE these medications    acidophilus Caps capsule Take 2 capsules by mouth 3 (three) times daily.   aspirin EC 81 MG tablet Take 81 mg by mouth daily. Swallow whole.   atorvastatin 40 MG tablet Commonly known as: LIPITOR Take 2 tablets (80 mg total) by mouth daily.   dorzolamide-timolol 2-0.5 % ophthalmic solution Commonly known as: COSOPT Place 1 drop into both eyes in the morning, at noon, and at bedtime.   Eliquis 5 MG Tabs tablet Generic drug: apixaban TAKE 1 TABLET(5 MG) BY MOUTH TWICE DAILY   levothyroxine 112 MCG tablet Commonly known as: SYNTHROID Take 1 tablet (112 mcg total) by mouth daily. Start taking on: June 28, 2022 What changed:  medication strength how much to take   Myrbetriq 25 MG Tb24 tablet Generic drug: mirabegron ER TAKE 1 TABLET(25 MG) BY MOUTH DAILY   nitroGLYCERIN 0.4 MG SL tablet Commonly known as: Nitrostat Place 1 tablet (0.4 mg total) under the tongue every 5 (five) minutes as needed for chest pain.   polyethylene glycol 17 g packet Commonly known as: MiraLax Take one dose 3 times a day until bowel clear, maximum of 3 consecutive days. What changed:  how much to take how to take this when to take this reasons to take this additional instructions   Rhopressa 0.02 %  Soln Generic drug: Netarsudil Dimesylate Place 1 drop into both eyes every evening.   sertraline 100 MG tablet Commonly known as: ZOLOFT Take 1 tablet (100 mg total) by mouth daily.   spironolactone 25 MG tablet Commonly known as: ALDACTONE TAKE 1 TABLET(25 MG) BY MOUTH DAILY What changed: See the new instructions.   Travatan Z 0.004 % Soln ophthalmic solution Generic drug: Travoprost (BAK Free) Place 1 drop into both eyes at bedtime.       Disposition:  Home Diet recommendation: Regular diet  Discharge Exam: Vitals:   06/27/22 0336 06/27/22 0750 06/27/22 1129 06/27/22 1541  BP: 114/75 120/64 (!) 143/71 (!) 131/57  Pulse: (!) 56 65 66 73  Resp: Temp: (!) 97.2 F (36.2 C) 97.7 F (36.5 C) (!) 97.5 F (36.4 C) (!) 97.5 F (36.4 C)  TempSrc: Oral Oral    SpO2: 100% 97% 100% 100%  Weight:      Height:       General: Appear in no distress; no visible Abnormal Neck Mass Or lumps, Conjunctiva normal Cardiovascular: S1 and S2 Present, no Murmur, Respiratory: good respiratory effort, Bilateral Air entry present and CTA, no Crackles, no wheezes Abdomen: Bowel Sound present, Non tender  Extremities: no Pedal edema Neurology: alert and oriented to time, place, and person  [facility] Weights   06/26/22 0829  Weight: 73.5 kg   Condition at discharge: stable  The results of significant diagnostics from this hospitalization (including imaging, microbiology, ancillary and laboratory) are listed below for reference.   Imaging Studies: MR BRAIN WO CONTRAST  Result Date: 06/26/2022 CLINICAL DATA:  Neuro deficit, acute, stroke suspected Not talking, altered mental status. History of strokes on CT EXAM: MRI HEAD WITHOUT CONTRAST TECHNIQUE: Multiplanar, multiecho pulse sequences of the brain and surrounding structures were obtained without intravenous contrast. COMPARISON:  None Available. FINDINGS: Brain: Remote left MCA territory infarcts. Remote left thalamic and bilateral basal ganglia lacunar infarcts. Multiple remote cerebellar infarcts. Remote right frontal cortical infarct. Additional scattered T2/FLAIR hyperintensities line matter, compatible with chronic microvascular ischemic disease. Cerebral atrophy. No evidence of acute hemorrhage, acute infarct, mass lesion, midline shift or hydrocephalus. Vascular: Abnormal flow void within the proximal basilar artery. Skull and upper cervical spine: Normal marrow signal. Sinuses/Orbits: Largely  clear sinuses.  No acute orbital findings. Other: No mastoid effusions. IMPRESSION: 1. No evidence of acute infarct. 2. Abnormal flow void within the proximal basilar artery. Recommend CTA or MRA to further evaluate for stenosis, occlusion, or thrombus. 3. Many remote infarcts and chronic microvascular ischemic change. 4.  Cerebral Atrophy (ICD10-G31.9). Electronically Signed   By: Feliberto Harts M.D.   On: 06/26/2022 18:22   DG Chest Portable 1 View  Result Date: 06/26/2022 CLINICAL DATA:  Altered mental status EXAM: PORTABLE CHEST 1 VIEW COMPARISON:  02/28/2022. FINDINGS: Cardiac silhouette is prominent. There is a possible nodule that appears to be superimposed with wires in the right base. No pneumothorax. Normal pulmonary vasculature. No definite pleural effusion. Osseous structures grossly intact. IMPRESSION: Enlarged cardiac silhouette. Possible nodule in the right base which was obscured by overlying opacities. Electronically Signed   By: Layla Maw M.D.   On: 06/26/2022 10:47   CT HEAD WO CONTRAST ( )  Result Date: 06/26/2022 CLINICAL DATA:  Altered mental status EXAM: CT HEAD WITHOUT CONTRAST TECHNIQUE: Contiguous axial images were obtained from the base of the skull through the vertex without intravenous contrast. RADIATION DOSE REDUCTION: This exam was performed according to the departmental dose-optimization program  which includes automated exposure control, adjustment of the mA and/or kV according to patient size and/or use of iterative reconstruction technique. COMPARISON:  CT head 04/23/22 FINDINGS: Brain: Redemonstrated chronic left parietal lobe and right frontal lobe/basal ganglia infarcts. There is also a chronic appearing infarct in the left corona radiata. Chronic bilateral cerebellar infarcts. No hemorrhage. No hydrocephalus. No extra-axial fluid collection. There is mild ex vacuo dilatation of the left occipital horn. Vascular: No hyperdense vessel or unexpected  calcification. Skull: Normal. Negative for fracture or focal lesion. Sinuses/Orbits: No middle ear or mastoid effusion. Paranasal sinuses are clear. Orbits are unremarkable. Other: None. IMPRESSION: 1. No acute intracranial abnormality. 2. Sequela of severe chronic microvascular ischemic change with multiple chronic infarcts, as above. Electronically Signed   By: Lorenza Cambridge M.D.   On: 06/26/2022 10:13    Microbiology: Results for orders placed or performed during the hospital encounter of 05/29/22  Resp panel by RT-PCR (RSV, Flu A&B, Covid) Anterior Nasal Swab     Status: None   Collection Time: 05/29/22  9:41 AM   Specimen: Anterior Nasal Swab  Result Value Ref Range Status   SARS Coronavirus 2 by RT PCR NEGATIVE NEGATIVE Final   Influenza A by PCR NEGATIVE NEGATIVE Final   Influenza B by PCR NEGATIVE NEGATIVE Final    Comment: (NOTE) The Xpert Xpress SARS-CoV-2/FLU/RSV plus assay is intended as an aid in the diagnosis of influenza from Nasopharyngeal swab specimens and should not be used as a sole basis for treatment. Nasal washings and aspirates are unacceptable for Xpert Xpress SARS-CoV-2/FLU/RSV testing.  Fact Sheet for Patients: BloggerCourse.com  Fact Sheet for Healthcare Providers: SeriousBroker.it  This test is not yet approved or cleared by the [place] FDA and has been authorized for detection and/or diagnosis of SARS-CoV-2 by FDA under an Emergency Use Authorization (EUA). This EUA will remain in effect (meaning this test can be used) for the duration of the COVID-19 declaration under Section 564(b)(1) of the Act, 21 U.S.C. section 360bbb-3(b)(1), unless the authorization is terminated or revoked.     Resp Syncytial Virus by PCR NEGATIVE NEGATIVE Final    Comment: (NOTE) Fact Sheet for Patients: BloggerCourse.com  Fact Sheet for Healthcare  Providers: SeriousBroker.it  This test is not yet approved or cleared by the [place] FDA and has been authorized for detection and/or diagnosis of SARS-CoV-2 by FDA under an Emergency Use Authorization (EUA). This EUA will remain in effect (meaning this test can be used) for the duration of the COVID-19 declaration under Section 564(b)(1) of the Act, 21 U.S.C. section 360bbb-3(b)(1), unless the authorization is terminated or revoked.  Performed at [facility], [address], [place], [place] [address]   Urine Culture     Status: Abnormal   Collection Time: 05/29/22 12:13 PM   Specimen: Urine, Clean Catch  Result Value Ref Range Status   Specimen Description URINE, CLEAN CATCH  Final   Special Requests   Final    NONE Reflexed from [id number] Performed at [facility], [address], [place], [place] [address]    Culture (A)  Final    >=100,000 COLONIES/mL MULTIPLE SPECIES PRESENT, SUGGEST RECOLLECTION   Report Status 05/30/2022 FINAL  Final   Labs: CBC: Recent Labs  Lab 06/26/22 1025 06/26/22 1101  WBC 7.3  --   NEUTROABS 4.6  --   HGB 12.9 13.9  HCT 40.7 41.0  MCV 99.8  --   PLT 235  --    Basic  Metabolic Panel: Recent Labs  Lab 06/26/22 1025 06/26/22 1101  NA 140 140  K 4.6 4.6  CL 105  --   CO2 26  --   GLUCOSE 88  --   BUN 20  --   CREATININE 1.11*  --   CALCIUM 9.3  --    Liver Function Tests: Recent Labs  Lab 06/26/22 1025  AST 27  ALT 19  ALKPHOS 108  BILITOT 0.6  PROT 6.9  ALBUMIN 3.5   CBG: Recent Labs  Lab 06/26/22 0933  GLUCAP 68*    Discharge time spent: greater than 30 minutes.  Signed: Lynden Oxford, MD [facility]

## 2022-06-27 NOTE — Evaluation (Signed)
Occupational Therapy Evaluation Patient Details Name: Yvonne Bailey MRN: [id number] DOB: 01/13/1941 Today's Date: 06/27/2022   History of Present Illness Pt is an 82 y.o. female who presented 06/26/22 with AMS. No evidence of acute infarct with imaging. UA appears to be clean. Admitted with acute encephalopathy vs TIA. PMHx: dementia, UTIs, CAD, s/p PCI, DVT, HTN, icm, lacunar infarct, MI, PAD, CKD3, stroke.   Clinical Impression   PTA patient reports her son lives with her, she has an aide 3-4x/week who assists with iADLs and showering but otherwise she is able to manage basic ADLs independently.  Her son assists with meds, IADLs; she receives meals on wheels and attends a daycare program while her son is at work.  Admitted for above and presents with problem list below.  She is able to follow simple commands with increased time, but is easily distracted; she has hx of dementia but is pleasant and cooperative throughout session.  She completes ADLs with up to min assist, transfers with min guard assist and functional mobility with min assist using RW.  Based on performance today, believe she will best benefit from further OT services acutely and after dc at [facility] level to optimize independence, safety and return to PLOF.  Will follow.      Recommendations for follow up therapy are one component of a multi-disciplinary discharge planning process, led by the attending physician.  Recommendations may be updated based on patient status, additional functional criteria and insurance authorization.   Assistance Recommended at Discharge Frequent or constant Supervision/Assistance  Patient can return home with the following A little help with walking and/or transfers;A little help with bathing/dressing/bathroom;Assistance with cooking/housework;Direct supervision/assist for medications management;Direct supervision/assist for financial management;Assist for transportation;Help with stairs or ramp for  entrance    Functional Status Assessment  Patient has had a recent decline in their functional status and demonstrates the ability to make significant improvements in function in a reasonable and predictable amount of time.  Equipment Recommendations  None recommended by OT    Recommendations for Other Services       Precautions / Restrictions Precautions Precautions: Fall Restrictions Weight Bearing Restrictions: No      Mobility Bed Mobility               General bed mobility comments: OOB in recliner upon entry    Transfers Overall transfer level: Needs assistance Equipment used: Rolling walker (2 wheels) Transfers: Sit to/from Stand Sit to Stand: Min guard           General transfer comment: increased time to power up but no physical assist required      Balance Overall balance assessment: Needs assistance Sitting-balance support: No upper extremity supported, Feet supported Sitting balance-Leahy Scale: Fair     Standing balance support: Bilateral upper extremity supported, No upper extremity supported, During functional activity Standing balance-Leahy Scale: Poor Standing balance comment: relies on BUE support dynamically                           ADL either performed or assessed with clinical judgement   ADL Overall ADL's : Needs assistance/impaired     Grooming: Standing;Min guard;Wash/dry hands           Upper Body Dressing : Set up;Sitting   Lower Body Dressing: Min guard;Sit to/from stand   Toilet Transfer: Min guard;Minimal assistance;Rolling walker (2 wheels) Toilet Transfer Details (indicate cue type and reason): simulated in room, min assist f or  RW mgmt         Functional mobility during ADLs: Minimal assistance;Rolling walker (2 wheels);Cueing for safety       Vision   Vision Assessment?: No apparent visual deficits     Perception     Praxis      Pertinent Vitals/Pain Pain Assessment Pain Assessment:  Faces Faces Pain Scale: No hurt Pain Intervention(s): Monitored during session     Hand Dominance Right   Extremity/Trunk Assessment Upper Extremity Assessment Upper Extremity Assessment: Generalized weakness   Lower Extremity Assessment Lower Extremity Assessment: Defer to PT evaluation       Communication Communication Communication: HOH   Cognition Arousal/Alertness: Awake/alert Behavior During Therapy: WFL for tasks assessed/performed Overall Cognitive Status: History of cognitive impairments - at baseline                                 General Comments: pt with hx of dementia, pleasant and cooperative throughout session. Follows 1 step commands with increased time, easily distracted.     General Comments       Exercises     Shoulder Instructions      Home Living Family/patient expects to be discharged to:: Private residence Living Arrangements: Children Available Help at Discharge: Family;Available PRN/intermittently;Personal care attendant Type of Home: House       Home Layout: One level     Bathroom Shower/Tub: Producer, television/film/video: Handicapped height     Home Equipment: Agricultural consultant (2 wheels);BSC/3in1;Shower seat;Cane - single point   Additional Comments: son works during day, but she attends [employer]      Prior Functioning/Environment Prior Level of Function : Needs assist             Mobility Comments: RW for amb ADLs Comments: pt reports having an aide to assist with some IADLs and showering, pt able to sponge bathe/dress/toilet herself, son assist with meds and IADLs.  Pt gets meals on wheels and has lunch at her daycare program.        OT Problem List: Decreased strength;Decreased activity tolerance;Impaired balance (sitting and/or standing);Decreased cognition;Decreased safety awareness;Decreased knowledge of use of DME or AE;Decreased knowledge of precautions      OT Treatment/Interventions:  Self-care/ADL training;DME and/or AE instruction;Therapeutic activities;Balance training;Patient/family education    OT Goals(Current goals can be found in the care plan section) Acute Rehab OT Goals Patient Stated Goal: none stated OT Goal Formulation: Patient unable to participate in goal setting Time For Goal Achievement: 07/11/22 Potential to Achieve Goals: Good  OT Frequency: Min 2X/week    Co-evaluation              AM-PAC OT "6 Clicks" Daily Activity     Outcome Measure Help from another person eating meals?: None Help from another person taking care of personal grooming?: A Little Help from another person toileting, which includes using toliet, bedpan, or urinal?: A Little Help from another person bathing (including washing, rinsing, drying)?: A Little Help from another person to put on and taking off regular upper body clothing?: A Little Help from another person to put on and taking off regular lower body clothing?: A Little 6 Click Score: 19   End of Session Equipment Utilized During Treatment: Rolling walker (2 wheels) Nurse Communication: Mobility status  Activity Tolerance: Patient tolerated treatment well Patient left: in chair;with call bell/phone within reach;with chair alarm set  OT Visit Diagnosis: Other abnormalities of  gait and mobility (R26.89);Muscle weakness (generalized) (M62.81)                Time: 1991-4445 OT Time Calculation (min): 19 min Charges:  OT General Charges $OT Visit: 1 Visit OT Evaluation $OT Eval Moderate Complexity: 1 Mod  Barry Brunner, OT Acute Rehabilitation Services Office [phone]   Chancy Milroy 06/27/2022, 11:25 AM

## 2022-06-27 NOTE — TOC Transition Note (Deleted)
Transition of Care [facility]) - CM/SW Discharge Note   Patient Details  Name: Yvonne Bailey MRN: [id number] Date of Birth: 1940-10-24  Transition of Care [facility]) CM/SW Contact:  Kermit Balo, RN Phone Number: 06/27/2022, 1:17 PM   Clinical Narrative:       Final next level of care: Home/Self Care Barriers to Discharge: No Barriers Identified   Patient Goals and CMS Choice      Discharge Placement                         Discharge Plan and Services Additional resources added to the After Visit Summary for                                       Social Determinants of Health (SDOH) Interventions SDOH Screenings   Food Insecurity: No Food Insecurity (06/26/2022)  Housing: Low Risk  (06/26/2022)  Transportation Needs: No Transportation Needs (06/26/2022)  Utilities: Not At Risk (06/26/2022)  Alcohol Screen: Low Risk  (12/17/2021)  Depression (PHQ2-9): Low Risk  (03/18/2022)  Financial Resource Strain: Low Risk  (12/17/2021)  Physical Activity: Inactive (12/17/2021)  Social Connections: Patient Declined (12/17/2021)  Stress: No Stress Concern Present (12/17/2021)  Tobacco Use: Medium Risk (06/26/2022)     Readmission Risk Interventions    11/19/2021   10:32 AM  Readmission Risk Prevention Plan  Transportation Screening Complete  PCP or Specialist Appt within 5-7 Days Complete  Home Care Screening Complete  Medication Review (RN CM) Complete

## 2022-06-27 NOTE — TOC Transition Note (Addendum)
Transition of Care [facility]) - CM/SW Discharge Note   Patient Details  Name: Yvonne Bailey MRN: [id number] Date of Birth: 10/26/1940  [facility]) CM/SW Contact:  Kermit Balo, RN Phone Number: 06/27/2022, 1:12 PM   Clinical Narrative:    Pt is discharging home with self care. She lives with her son and goes to [facility] while he works. Due to this she doesn't qualify for [facility] as she isnt home bound. With pts son permission CM spoke to [facility] and they will work with the patient on exercises provided by out PT/OT while she is there. CM will provide in her d/c paperwork the orders for her to participate with exercises at the [facility]. CM has asked PT/OT to provide written exercises for the facility to help her with.  Son will provide transport home. He manages her medications.  DME at home: walker/ shower seat/ [facility]    Final next level of care: Home/Self Care Barriers to Discharge: No Barriers Identified   Patient Goals and CMS Choice      Discharge Placement                         Discharge Plan and Services Additional resources added to the After Visit Summary for                                       Social Determinants of Health (SDOH) Interventions SDOH Screenings   Food Insecurity: No Food Insecurity (06/26/2022)  Housing: Low Risk  (06/26/2022)  Transportation Needs: No Transportation Needs (06/26/2022)  Utilities: Not At Risk (06/26/2022)  Alcohol Screen: Low Risk  (12/17/2021)  Depression (PHQ2-9): Low Risk  (03/18/2022)  Financial Resource Strain: Low Risk  (12/17/2021)  Physical Activity: Inactive (12/17/2021)  Social Connections: Patient Declined (12/17/2021)  Stress: No Stress Concern Present (12/17/2021)  Tobacco Use: Medium Risk (06/26/2022)     Readmission Risk Interventions    11/19/2021   10:32 AM  Readmission Risk Prevention Plan  Transportation Screening Complete  PCP or  Specialist Appt within 5-7 Days Complete  Home Care Screening Complete  Medication Review (RN CM) Complete

## 2022-06-27 NOTE — Evaluation (Signed)
Physical Therapy Evaluation Patient Details Name: Yvonne Bailey MRN: [id number] DOB: [birth year] Today's Date: 06/27/2022  History of Present Illness  Pt is an 82 y.o. female who presented 06/26/22 with AMS. No evidence of acute infarct with imaging. UA appears to be clean. Admitted with acute encephalopathy vs TIA. PMHx: dementia, UTIs, CAD, s/p PCI, DVT, HTN, icm, lacunar infarct, MI, PAD, CKD3, stroke.   Clinical Impression  Pt presents with condition above and deficits mentioned below, see PT Problem List. Per pt, she lives with her son and uses a RW at baseline. Pt tends to go to a daycare when her son is at work during the week. Currently, pt is demonstrating deficits in generalized strength, balance, and activity tolerance and is at risk for falls. She required up to [facility] for transfers and to ambulate with a RW, needing reminders and assistance to keep the RW proximal to her. She will likely do better in her familiar home, thus recommending follow-up with HHPT upon d/c. Will continue to follow acutely.       Recommendations for follow up therapy are one component of a multi-disciplinary discharge planning process, led by the attending physician.  Recommendations may be updated based on patient status, additional functional criteria and insurance authorization.  Follow Up Recommendations       Assistance Recommended at Discharge Frequent or constant Supervision/Assistance  Patient can return home with the following  A little help with walking and/or transfers;A little help with bathing/dressing/bathroom;Assistance with cooking/housework;Direct supervision/assist for medications management;Direct supervision/assist for financial management;Assist for transportation;Help with stairs or ramp for entrance    Equipment Recommendations None recommended by PT  Recommendations for Other Services       Functional Status Assessment Patient has had a recent decline in their functional status  and demonstrates the ability to make significant improvements in function in a reasonable and predictable amount of time.     Precautions / Restrictions Precautions Precautions: Fall Restrictions Weight Bearing Restrictions: No      Mobility  Bed Mobility               General bed mobility comments: OOB in chair with nurse upon arrival    Transfers Overall transfer level: Needs assistance Equipment used: Rolling walker (2 wheels) Transfers: Sit to/from Stand Sit to Stand: Min guard, Min assist           General transfer comment: Min guard assist when pt would pull up to stand using the sink, but minA to for balance and to power up to stand when pt would pull up on the RW    Ambulation/Gait Ambulation/Gait assistance: Min assist Gait Distance (Feet): 25 Feet Assistive device: Rolling walker (2 wheels) Gait Pattern/deviations: Step-through pattern, Decreased stride length, Trunk flexed, Drifts right/left Gait velocity: reduced Gait velocity interpretation: <1.31 ft/sec, indicative of household ambulator   General Gait Details: Pt with slow, small steps and flexed posture, needing cues to remain proximal within RW and avoid drifting her R foot out laterally to it. MinA for balance and RW guidance to direct pt to recliner  Stairs            Wheelchair Mobility    Modified Rankin (Stroke Patients Only) Modified Rankin (Stroke Patients Only) Pre-Morbid Rankin Score: Moderate disability Modified Rankin: Moderately severe disability     Balance Overall balance assessment: Needs assistance Sitting-balance support: No upper extremity supported, Feet supported Sitting balance-Leahy Scale: Fair     Standing balance support: Bilateral upper extremity supported, No upper  extremity supported, During functional activity Standing balance-Leahy Scale: Fair Standing balance comment: Able to stand at sink without UE support with min guard-minA, but reliant on UE  support to reeach off BOS or ambulate                             Pertinent Vitals/Pain Pain Assessment Pain Assessment: Faces Faces Pain Scale: No hurt Pain Intervention(s): Monitored during session    Home Living Family/patient expects to be discharged to:: Private residence Living Arrangements: Children Available Help at Discharge: Family;Available PRN/intermittently;Personal care attendant Type of Home: House         Home Layout: One level Home Equipment: Agricultural consultant (2 wheels);BSC/3in1;Shower seat;Cane - single point Additional Comments: son works during day, but she attends [employer]    Prior Function Prior Level of Function : Needs assist             Mobility Comments: RW for amb ADLs Comments: pt reports having an aide to assist with some IADLs and showering, pt able to sponge bathe/dress/toilet herself, son assist with meds and IADLs.  Pt gets meals on wheels and has lunch at her daycare program.     Hand Dominance   Dominant Hand: Right    Extremity/Trunk Assessment   Upper Extremity Assessment Upper Extremity Assessment: Defer to OT evaluation    Lower Extremity Assessment Lower Extremity Assessment: Generalized weakness    Cervical / Trunk Assessment Cervical / Trunk Assessment: Kyphotic  Communication   Communication: HOH  Cognition Arousal/Alertness: Awake/alert Behavior During Therapy: WFL for tasks assessed/performed Overall Cognitive Status: History of cognitive impairments - at baseline                                 General Comments: pt with hx of dementia, pleasant and cooperative throughout session. Follows 1 step commands with increased time, easily distracted. Oriented to location and situation        General Comments      Exercises     Assessment/Plan    PT Assessment Patient needs continued PT services  PT Problem List Decreased strength;Decreased activity tolerance;Decreased  balance;Decreased mobility;Decreased cognition       PT Treatment Interventions DME instruction;Gait training;Stair training;Functional mobility training;Therapeutic activities;Therapeutic exercise;Balance training;Neuromuscular re-education;Cognitive remediation;Patient/family education    PT Goals (Current goals can be found in the Care Plan section)  Acute Rehab PT Goals Patient Stated Goal: to get cleaned up PT Goal Formulation: With patient Time For Goal Achievement: 07/11/22 Potential to Achieve Goals: Good    Frequency Min 3X/week     Co-evaluation               AM-PAC PT "6 Clicks" Mobility  Outcome Measure Help needed turning from your back to your side while in a flat bed without using bedrails?: A Little Help needed moving from lying on your back to sitting on the side of a flat bed without using bedrails?: A Little Help needed moving to and from a bed to a chair (including a wheelchair)?: A Little Help needed standing up from a chair using your arms (e.g., wheelchair or bedside chair)?: A Little Help needed to walk in hospital room?: A Little Help needed climbing 3-5 steps with a railing? : A Lot 6 Click Score: 17    End of Session Equipment Utilized During Treatment: Gait belt Activity Tolerance: Patient tolerated treatment well Patient  left: in chair;with call bell/phone within reach;with chair alarm set Nurse Communication: Mobility status PT Visit Diagnosis: Unsteadiness on feet (R26.81);Other abnormalities of gait and mobility (R26.89);Muscle weakness (generalized) (M62.81)    Time: 0940-1010 PT Time Calculation (min) (ACUTE ONLY): 30 min   Charges:   PT Evaluation $PT Eval Moderate Complexity: 1 Mod PT Treatments $Therapeutic Activity: 8-22 mins        Raymond Gurney, PT, DPT Acute Rehabilitation Services  Office: [phone]   Yvonne Bailey 06/27/2022, 12:13 PM

## 2022-06-27 NOTE — Progress Notes (Signed)
Pt has been discharged home with son with all belongings intact. . IV has been removed. Catheter intact. Tele has been removed. Discharge instructions and education reviewed with pt's son [name]. Pt has left floor via wheelchair and accompanied to car with nursing staff.

## 2022-06-27 NOTE — Care Management Obs Status (Signed)
MEDICARE OBSERVATION STATUS NOTIFICATION   Patient Details  Name: Yvonne Bailey MRN: [id number] Date of Birth: [birth year]   Medicare Observation Status Notification Given:  Yes    Kermit Balo, RN 06/27/2022, 1:20 PM

## 2022-06-28 ENCOUNTER — Other Ambulatory Visit: Payer: Self-pay | Admitting: Internal Medicine

## 2022-06-28 DIAGNOSIS — N3000 Acute cystitis without hematuria: Secondary | ICD-10-CM

## 2022-06-28 LAB — T3, FREE: T3, Free: 1.7 pg/mL — ABNORMAL LOW (ref 2.0–4.4)

## 2022-06-28 LAB — URINE CULTURE: Culture: 60000 — AB

## 2022-06-28 MED ORDER — AMOXICILLIN 250 MG PO CAPS: 500.0000 mg | ORAL_CAPSULE | Freq: Two times a day (BID) | ORAL | Status: DC

## 2022-06-28 NOTE — Progress Notes (Signed)
error 

## 2022-06-29 ENCOUNTER — Emergency Department (HOSPITAL_COMMUNITY): Payer: Medicare HMO

## 2022-06-29 ENCOUNTER — Observation Stay (HOSPITAL_COMMUNITY): Payer: Medicare HMO

## 2022-06-29 ENCOUNTER — Inpatient Hospital Stay (HOSPITAL_COMMUNITY)
Admission: EM | Admit: 2022-06-29 | Discharge: 2022-07-05 | DRG: 689 | Disposition: A | Discharge status: Skilled Nursing Facility | Payer: Medicare HMO | Attending: Student | Admitting: Student

## 2022-06-29 ENCOUNTER — Encounter (HOSPITAL_COMMUNITY): Payer: Self-pay | Admitting: Emergency Medicine

## 2022-06-29 ENCOUNTER — Other Ambulatory Visit: Payer: Self-pay

## 2022-06-29 DIAGNOSIS — R1311 Dysphagia, oral phase: Secondary | ICD-10-CM | POA: Diagnosis not present

## 2022-06-29 DIAGNOSIS — B952 Enterococcus as the cause of diseases classified elsewhere: Secondary | ICD-10-CM | POA: Diagnosis present

## 2022-06-29 DIAGNOSIS — I10 Essential (primary) hypertension: Secondary | ICD-10-CM | POA: Diagnosis present

## 2022-06-29 DIAGNOSIS — E538 Deficiency of other specified B group vitamins: Secondary | ICD-10-CM | POA: Diagnosis present

## 2022-06-29 DIAGNOSIS — W19XXXA Unspecified fall, initial encounter: Secondary | ICD-10-CM | POA: Diagnosis present

## 2022-06-29 DIAGNOSIS — I252 Old myocardial infarction: Secondary | ICD-10-CM

## 2022-06-29 DIAGNOSIS — A419 Sepsis, unspecified organism: Secondary | ICD-10-CM | POA: Diagnosis not present

## 2022-06-29 DIAGNOSIS — R2689 Other abnormalities of gait and mobility: Secondary | ICD-10-CM | POA: Diagnosis not present

## 2022-06-29 DIAGNOSIS — G9341 Metabolic encephalopathy: Secondary | ICD-10-CM | POA: Diagnosis present

## 2022-06-29 DIAGNOSIS — Z7401 Bed confinement status: Secondary | ICD-10-CM | POA: Diagnosis not present

## 2022-06-29 DIAGNOSIS — F03918 Unspecified dementia, unspecified severity, with other behavioral disturbance: Secondary | ICD-10-CM | POA: Diagnosis present

## 2022-06-29 DIAGNOSIS — Z955 Presence of coronary angioplasty implant and graft: Secondary | ICD-10-CM

## 2022-06-29 DIAGNOSIS — I679 Cerebrovascular disease, unspecified: Secondary | ICD-10-CM | POA: Diagnosis not present

## 2022-06-29 DIAGNOSIS — Z86718 Personal history of other venous thrombosis and embolism: Secondary | ICD-10-CM

## 2022-06-29 DIAGNOSIS — Z8673 Personal history of transient ischemic attack (TIA), and cerebral infarction without residual deficits: Secondary | ICD-10-CM

## 2022-06-29 DIAGNOSIS — F919 Conduct disorder, unspecified: Secondary | ICD-10-CM | POA: Diagnosis present

## 2022-06-29 DIAGNOSIS — I6789 Other cerebrovascular disease: Secondary | ICD-10-CM | POA: Diagnosis present

## 2022-06-29 DIAGNOSIS — Z7901 Long term (current) use of anticoagulants: Secondary | ICD-10-CM

## 2022-06-29 DIAGNOSIS — Z79899 Other long term (current) drug therapy: Secondary | ICD-10-CM

## 2022-06-29 DIAGNOSIS — N1831 Chronic kidney disease, stage 3a: Secondary | ICD-10-CM | POA: Diagnosis present

## 2022-06-29 DIAGNOSIS — I129 Hypertensive chronic kidney disease with stage 1 through stage 4 chronic kidney disease, or unspecified chronic kidney disease: Secondary | ICD-10-CM | POA: Diagnosis present

## 2022-06-29 DIAGNOSIS — R001 Bradycardia, unspecified: Secondary | ICD-10-CM | POA: Diagnosis present

## 2022-06-29 DIAGNOSIS — R531 Weakness: Secondary | ICD-10-CM | POA: Diagnosis present

## 2022-06-29 DIAGNOSIS — I739 Peripheral vascular disease, unspecified: Secondary | ICD-10-CM | POA: Diagnosis not present

## 2022-06-29 DIAGNOSIS — R32 Unspecified urinary incontinence: Secondary | ICD-10-CM | POA: Diagnosis present

## 2022-06-29 DIAGNOSIS — R4182 Altered mental status, unspecified: Secondary | ICD-10-CM | POA: Diagnosis not present

## 2022-06-29 DIAGNOSIS — F0393 Unspecified dementia, unspecified severity, with mood disturbance: Secondary | ICD-10-CM | POA: Diagnosis present

## 2022-06-29 DIAGNOSIS — Z79890 Hormone replacement therapy (postmenopausal): Secondary | ICD-10-CM

## 2022-06-29 DIAGNOSIS — Z91148 Patient's other noncompliance with medication regimen for other reason: Secondary | ICD-10-CM

## 2022-06-29 DIAGNOSIS — L280 Lichen simplex chronicus: Secondary | ICD-10-CM | POA: Diagnosis present

## 2022-06-29 DIAGNOSIS — I5022 Chronic systolic (congestive) heart failure: Secondary | ICD-10-CM | POA: Diagnosis not present

## 2022-06-29 DIAGNOSIS — N183 Chronic kidney disease, stage 3 unspecified: Secondary | ICD-10-CM | POA: Diagnosis present

## 2022-06-29 DIAGNOSIS — I4891 Unspecified atrial fibrillation: Secondary | ICD-10-CM | POA: Diagnosis present

## 2022-06-29 DIAGNOSIS — E039 Hypothyroidism, unspecified: Secondary | ICD-10-CM | POA: Diagnosis not present

## 2022-06-29 DIAGNOSIS — E785 Hyperlipidemia, unspecified: Secondary | ICD-10-CM | POA: Diagnosis present

## 2022-06-29 DIAGNOSIS — Z87891 Personal history of nicotine dependence: Secondary | ICD-10-CM | POA: Diagnosis not present

## 2022-06-29 DIAGNOSIS — G928 Other toxic encephalopathy: Secondary | ICD-10-CM | POA: Diagnosis present

## 2022-06-29 DIAGNOSIS — I482 Chronic atrial fibrillation, unspecified: Secondary | ICD-10-CM | POA: Diagnosis not present

## 2022-06-29 DIAGNOSIS — R29703 NIHSS score 3: Secondary | ICD-10-CM | POA: Diagnosis present

## 2022-06-29 DIAGNOSIS — I2510 Atherosclerotic heart disease of native coronary artery without angina pectoris: Secondary | ICD-10-CM | POA: Diagnosis present

## 2022-06-29 DIAGNOSIS — I255 Ischemic cardiomyopathy: Secondary | ICD-10-CM | POA: Diagnosis present

## 2022-06-29 DIAGNOSIS — R404 Transient alteration of awareness: Secondary | ICD-10-CM | POA: Diagnosis present

## 2022-06-29 DIAGNOSIS — G4750 Parasomnia, unspecified: Secondary | ICD-10-CM | POA: Diagnosis present

## 2022-06-29 DIAGNOSIS — R569 Unspecified convulsions: Secondary | ICD-10-CM | POA: Diagnosis not present

## 2022-06-29 DIAGNOSIS — N390 Urinary tract infection, site not specified: Secondary | ICD-10-CM | POA: Diagnosis present

## 2022-06-29 DIAGNOSIS — I4811 Longstanding persistent atrial fibrillation: Secondary | ICD-10-CM | POA: Diagnosis present

## 2022-06-29 DIAGNOSIS — Z888 Allergy status to other drugs, medicaments and biological substances status: Secondary | ICD-10-CM

## 2022-06-29 DIAGNOSIS — Z7982 Long term (current) use of aspirin: Secondary | ICD-10-CM

## 2022-06-29 DIAGNOSIS — G459 Transient cerebral ischemic attack, unspecified: Secondary | ICD-10-CM | POA: Diagnosis present

## 2022-06-29 DIAGNOSIS — Z823 Family history of stroke: Secondary | ICD-10-CM

## 2022-06-29 DIAGNOSIS — N3000 Acute cystitis without hematuria: Secondary | ICD-10-CM | POA: Diagnosis not present

## 2022-06-29 DIAGNOSIS — Z8249 Family history of ischemic heart disease and other diseases of the circulatory system: Secondary | ICD-10-CM

## 2022-06-29 DIAGNOSIS — Z86711 Personal history of pulmonary embolism: Secondary | ICD-10-CM

## 2022-06-29 DIAGNOSIS — R41841 Cognitive communication deficit: Secondary | ICD-10-CM | POA: Diagnosis not present

## 2022-06-29 DIAGNOSIS — Z833 Family history of diabetes mellitus: Secondary | ICD-10-CM

## 2022-06-29 DIAGNOSIS — I63512 Cerebral infarction due to unspecified occlusion or stenosis of left middle cerebral artery: Secondary | ICD-10-CM | POA: Diagnosis not present

## 2022-06-29 DIAGNOSIS — I69320 Aphasia following cerebral infarction: Secondary | ICD-10-CM | POA: Diagnosis not present

## 2022-06-29 DIAGNOSIS — M6281 Muscle weakness (generalized): Secondary | ICD-10-CM | POA: Diagnosis not present

## 2022-06-29 DIAGNOSIS — F32A Depression, unspecified: Secondary | ICD-10-CM | POA: Diagnosis present

## 2022-06-29 DIAGNOSIS — I82409 Acute embolism and thrombosis of unspecified deep veins of unspecified lower extremity: Secondary | ICD-10-CM | POA: Diagnosis not present

## 2022-06-29 DIAGNOSIS — I63233 Cerebral infarction due to unspecified occlusion or stenosis of bilateral carotid arteries: Secondary | ICD-10-CM | POA: Diagnosis not present

## 2022-06-29 LAB — CBC WITH DIFFERENTIAL/PLATELET
Abs Immature Granulocytes: 0.02 10*3/uL (ref 0.00–0.07)
Basophils Absolute: 0.1 10*3/uL (ref 0.0–0.1)
Basophils Relative: 1 %
Eosinophils Absolute: 0.2 10*3/uL (ref 0.0–0.5)
Eosinophils Relative: 2 %
HCT: 41 % (ref 36.0–46.0)
Hemoglobin: 13.3 g/dL (ref 12.0–15.0)
Immature Granulocytes: 0 %
Lymphocytes Relative: 20 %
Lymphs Abs: 1.9 10*3/uL (ref 0.7–4.0)
MCH: 31.9 pg (ref 26.0–34.0)
MCHC: 32.4 g/dL (ref 30.0–36.0)
MCV: 98.3 fL (ref 80.0–100.0)
Monocytes Absolute: 0.6 10*3/uL (ref 0.1–1.0)
Monocytes Relative: 6 %
Neutro Abs: 6.7 10*3/uL (ref 1.7–7.7)
Neutrophils Relative %: 71 %
Platelets: 245 10*3/uL (ref 150–400)
RBC: 4.17 MIL/uL (ref 3.87–5.11)
RDW: 12.7 % (ref 11.5–15.5)
WBC: 9.4 10*3/uL (ref 4.0–10.5)
nRBC: 0 % (ref 0.0–0.2)

## 2022-06-29 LAB — TSH: TSH: 8.428 u[IU]/mL — ABNORMAL HIGH (ref 0.350–4.500)

## 2022-06-29 LAB — COMPREHENSIVE METABOLIC PANEL
ALT: 20 U/L (ref 0–44)
AST: 29 U/L (ref 15–41)
Albumin: 3.3 g/dL — ABNORMAL LOW (ref 3.5–5.0)
Alkaline Phosphatase: 102 U/L (ref 38–126)
Anion gap: 8 (ref 5–15)
BUN: 18 mg/dL (ref 8–23)
CO2: 25 mmol/L (ref 22–32)
Calcium: 8.7 mg/dL — ABNORMAL LOW (ref 8.9–10.3)
Chloride: 106 mmol/L (ref 98–111)
Creatinine, Ser: 1.15 mg/dL — ABNORMAL HIGH (ref 0.44–1.00)
GFR, Estimated: 48 mL/min — ABNORMAL LOW (ref >60)
Glucose, Bld: 94 mg/dL (ref 70–99)
Potassium: 4.1 mmol/L (ref 3.5–5.1)
Sodium: 139 mmol/L (ref 135–145)
Total Bilirubin: 0.8 mg/dL (ref 0.3–1.2)
Total Protein: 6.8 g/dL (ref 6.5–8.1)

## 2022-06-29 LAB — URINALYSIS, ROUTINE W REFLEX MICROSCOPIC
Bilirubin Urine: NEGATIVE
Glucose, UA: NEGATIVE mg/dL
Ketones, ur: NEGATIVE mg/dL
Nitrite: NEGATIVE
Protein, ur: 30 mg/dL — AB
Specific Gravity, Urine: 1.041 — ABNORMAL HIGH (ref 1.005–1.030)
WBC, UA: >50 WBC/hpf (ref 0–5)
pH: 6 (ref 5.0–8.0)

## 2022-06-29 LAB — RAPID URINE DRUG SCREEN, HOSP PERFORMED
Amphetamines: NOT DETECTED
Barbiturates: NOT DETECTED
Benzodiazepines: NOT DETECTED
Cocaine: NOT DETECTED
Opiates: NOT DETECTED
Tetrahydrocannabinol: NOT DETECTED

## 2022-06-29 LAB — ETHANOL: Alcohol, Ethyl (B): <10 mg/dL (ref <10)

## 2022-06-29 LAB — T4, FREE: Free T4: 0.73 ng/dL (ref 0.61–1.12)

## 2022-06-29 LAB — CBG MONITORING, ED: Glucose-Capillary: 80 mg/dL (ref 70–99)

## 2022-06-29 MED ORDER — APIXABAN 5 MG PO TABS
5.0000 mg | ORAL_TABLET | Freq: Two times a day (BID) | ORAL | Status: DC
  Administered 2022-06-29 – 2022-07-01 (×4): 5 mg via ORAL
  Filled 2022-06-29 (×4): qty 1

## 2022-06-29 MED ORDER — ONDANSETRON HCL 4 MG PO TABS: 4.0000 mg | ORAL_TABLET | Freq: Four times a day (QID) | ORAL | Status: DC | PRN

## 2022-06-29 MED ORDER — NITROGLYCERIN 0.4 MG SL SUBL: 0.4000 mg | SUBLINGUAL_TABLET | SUBLINGUAL | Status: DC | PRN

## 2022-06-29 MED ORDER — AMOXICILLIN 500 MG PO CAPS
500.0000 mg | ORAL_CAPSULE | Freq: Two times a day (BID) | ORAL | Status: DC
  Administered 2022-06-29 – 2022-06-30 (×2): 500 mg via ORAL
  Filled 2022-06-29 (×2): qty 1

## 2022-06-29 MED ORDER — FENTANYL CITRATE PF 50 MCG/ML IJ SOSY: 12.5000 ug | PREFILLED_SYRINGE | INTRAMUSCULAR | Status: DC | PRN

## 2022-06-29 MED ORDER — SPIRONOLACTONE 25 MG PO TABS
25.0000 mg | ORAL_TABLET | Freq: Every day | ORAL | Status: DC
  Administered 2022-06-30 – 2022-07-01 (×2): 25 mg via ORAL
  Filled 2022-06-29 (×2): qty 1

## 2022-06-29 MED ORDER — SODIUM CHLORIDE 0.9% FLUSH
3.0000 mL | INTRAVENOUS | Status: DC | PRN
  Administered 2022-06-29: 3 mL via INTRAVENOUS

## 2022-06-29 MED ORDER — ENOXAPARIN SODIUM 40 MG/0.4ML IJ SOSY: 40.0000 mg | PREFILLED_SYRINGE | INTRAMUSCULAR | Status: DC

## 2022-06-29 MED ORDER — RISAQUAD PO CAPS
2.0000 | ORAL_CAPSULE | Freq: Three times a day (TID) | ORAL | Status: DC
  Administered 2022-06-30 – 2022-07-01 (×3): 2 via ORAL
  Filled 2022-06-29 (×5): qty 2

## 2022-06-29 MED ORDER — ATORVASTATIN CALCIUM 80 MG PO TABS
80.0000 mg | ORAL_TABLET | Freq: Every day | ORAL | Status: DC
  Administered 2022-06-30 – 2022-07-01 (×2): 80 mg via ORAL
  Filled 2022-06-29 (×2): qty 1

## 2022-06-29 MED ORDER — POLYETHYLENE GLYCOL 3350 17 G PO PACK: 17.0000 g | PACK | Freq: Every day | ORAL | Status: DC | PRN

## 2022-06-29 MED ORDER — METOPROLOL TARTRATE 5 MG/5ML IV SOLN: 5.0000 mg | Freq: Four times a day (QID) | INTRAVENOUS | Status: DC | PRN

## 2022-06-29 MED ORDER — LATANOPROST 0.005 % OP SOLN
1.0000 [drp] | Freq: Every day | OPHTHALMIC | Status: DC
  Administered 2022-06-30 – 2022-07-04 (×5): 1 [drp] via OPHTHALMIC
  Filled 2022-06-29 (×2): qty 2.5

## 2022-06-29 MED ORDER — IOHEXOL 350 MG/ML SOLN
75.0000 mL | Freq: Once | INTRAVENOUS | Status: AC | PRN
  Administered 2022-06-29: 75 mL via INTRAVENOUS

## 2022-06-29 MED ORDER — ASPIRIN 81 MG PO TBEC
81.0000 mg | DELAYED_RELEASE_TABLET | Freq: Every day | ORAL | Status: DC
  Administered 2022-06-29 – 2022-07-05 (×6): 81 mg via ORAL
  Filled 2022-06-29 (×7): qty 1

## 2022-06-29 MED ORDER — DORZOLAMIDE HCL-TIMOLOL MAL 2-0.5 % OP SOLN
1.0000 [drp] | Freq: Two times a day (BID) | OPHTHALMIC | Status: DC
  Administered 2022-06-30 – 2022-07-05 (×9): 1 [drp] via OPHTHALMIC
  Filled 2022-06-29: qty 10

## 2022-06-29 MED ORDER — SODIUM CHLORIDE 0.9 % IV SOLN: 250.0000 mL | INTRAVENOUS | Status: DC | PRN

## 2022-06-29 MED ORDER — LEVOTHYROXINE SODIUM 112 MCG PO TABS
112.0000 ug | ORAL_TABLET | Freq: Every day | ORAL | Status: DC
  Administered 2022-06-30 – 2022-07-01 (×2): 112 ug via ORAL
  Filled 2022-06-29 (×2): qty 1

## 2022-06-29 MED ORDER — ONDANSETRON HCL 4 MG/2ML IJ SOLN: 4.0000 mg | Freq: Four times a day (QID) | INTRAMUSCULAR | Status: DC | PRN

## 2022-06-29 MED ORDER — SERTRALINE HCL 100 MG PO TABS
100.0000 mg | ORAL_TABLET | Freq: Every day | ORAL | Status: DC
  Administered 2022-06-30 – 2022-07-01 (×2): 100 mg via ORAL
  Filled 2022-06-29 (×2): qty 1

## 2022-06-29 MED ORDER — ACETAMINOPHEN 650 MG RE SUPP: 650.0000 mg | Freq: Four times a day (QID) | RECTAL | Status: DC | PRN

## 2022-06-29 MED ORDER — SODIUM CHLORIDE 0.9% FLUSH
3.0000 mL | Freq: Two times a day (BID) | INTRAVENOUS | Status: DC
  Administered 2022-06-29 – 2022-07-05 (×10): 3 mL via INTRAVENOUS

## 2022-06-29 MED ORDER — NETARSUDIL DIMESYLATE 0.02 % OP SOLN: 1.0000 [drp] | Freq: Every evening | OPHTHALMIC | Status: DC

## 2022-06-29 MED ORDER — ACETAMINOPHEN 325 MG PO TABS: 650.0000 mg | ORAL_TABLET | Freq: Four times a day (QID) | ORAL | Status: DC | PRN

## 2022-06-29 MED ORDER — MIRABEGRON ER 25 MG PO TB24
25.0000 mg | ORAL_TABLET | Freq: Every day | ORAL | Status: DC
  Administered 2022-06-29 – 2022-07-01 (×3): 25 mg via ORAL
  Filled 2022-06-29 (×4): qty 1

## 2022-06-29 NOTE — Assessment & Plan Note (Signed)
Appears to be stable.  

## 2022-06-29 NOTE — Assessment & Plan Note (Signed)
Initially thought to be this Neurology consulted and ED and felt the patient had metabolic encephalopathy Patient is maximally treated for prevention of strokes including statin, antiplatelet, Eliquis Has significant atherosclerosis of the basilar artery but no acute blockage I would not be a candidate for intervention unless this acutely occludes, which is not present on CTA today Check EEG Check B12 level Treat UTI Improved thyroid function

## 2022-06-29 NOTE — Assessment & Plan Note (Signed)
-  Continue Lipitor °

## 2022-06-29 NOTE — Assessment & Plan Note (Signed)
Creatinine appears to be at baseline Avoid nephrotoxic agents Trend

## 2022-06-29 NOTE — Assessment & Plan Note (Signed)
Continue Aldactone °

## 2022-06-29 NOTE — H&P (Signed)
History and Physical    Patient: Yvonne Bailey:[id number] DOB: Mar 11, 1941 DOA: 06/29/2022 DOS: the patient was seen and examined on 06/29/2022 PCP: Pincus Sanes, MD  Patient coming from: Home  Chief Complaint:  Chief Complaint  Patient presents with   Altered Mental Status   HPI: Yvonne Bailey is a 82 y.o. female with medical history significant of  proximal A-fib, HTN, CKD, dementia, hypothyroidism, coronary artery disease, history of PE on Eliquis who was recently admitted to the hospital and discharged approximately 1 day ago with a similar story.  The patient has episodes of not responding up to about 1 hour where she is not talking and seems increasingly lethargic.  She was observed night and returned to her normal mentation.  At that time she was found to have undertreated hypothyroidism and her Synthroid was increased.  She had another episode today where she just stopped responding she would not talk or do anything.  Patient does have history of dementia, lives with her son and goes to a senior center daily and takes the bus to get there.  This seems so unlike her and so she was brought in for this.  Neurology recommended admission and stroke workup however after seeing her in the ED he recommended an EEG, B12 testing and optimizing her thyroid treatment as well as ruling out UTI.  Review of Systems: unable to review all systems due to the inability of the patient to answer questions. Past Medical History:  Diagnosis Date   Bilateral leg edema 07/19/2015   CAD (coronary artery disease)    a. anterior MI s/p PCI in 1992. b. cath 08/2015 with severe three-vessel CAD turned down for CABG and underwent DESx5 to prox Cx/OM2/D1/oRCA/mRCA   Carotid artery disease    a. carotid duplex 03/2015 showed 1-39% BICA, normal subclavian arteries, chronically occluded left vertebral, f/u recommended only PRN.   DVT (deep venous thrombosis)    X1   History of nuclear stress test    a.  Myoview 6/17: EF 20-25%, mid anteroseptal, apical anterior, apical septal, apical inferior, apical lateral and apical scar, no ischemia, intermediate risk   HTN (hypertension)    Hyperlipidemia    Hypokalemia    Hypothyroidism    Ischemic cardiomyopathy    a. Echo 6/17: EF 20-25%, apex appears akinetic, MAC, moderate MR, moderate LAE, mild RVE, trivial PI, PASP 47 mmHg (needs repeat with Definity contrast).  b. Limited echo with Definity contrast 7/17: EF 25-30%, moderate to severe LAE. c. Limited Echo 2018 showed EF 40-45%.   Lacunar infarction 12/17/2012   Dr Terrace Arabia, Neurology    Leukocytosis    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   Longstanding persistent atrial fibrillation    MI (myocardial infarction) 1992   PAD (peripheral artery disease)    Right SFA occlusion, severe disease left CFA and SFA   Prediabetes 07/28/2016   Stage 3 chronic kidney disease    Stroke    a. 02/2017 in setting of noncompliance with Eliquis   TIA (transient ischemic attack)    Tricuspid regurgitation    Past Surgical History:  Procedure Laterality Date   APPENDECTOMY     at hysterectomy and USO for fibroids, Dr. Kizzie Bane   CARDIAC CATHETERIZATION  1992   Dr Charlies Constable   CARDIAC CATHETERIZATION N/A 09/06/2015   Procedure: Right/Left Heart Cath and Coronary Angiography;  Surgeon: Kathleene Hazel, MD;  Location: [facility] INVASIVE CV LAB;  Service: Cardiovascular;  Laterality: N/A;   CARDIAC CATHETERIZATION N/A  09/07/2015   Procedure: Coronary Stent Intervention;  Surgeon: Kathleene Hazel, MD;  Location: [facility] INVASIVE CV LAB;  Service: Cardiovascular;  Laterality: N/A;   COLONOSCOPY     negative; 2008, Dr. Lina Sar   fracture LLE     '94; pinned   IR ANGIO INTRA EXTRACRAN SEL COM CAROTID INNOMINATE BILAT MOD SED  03/02/2017   IR ANGIO INTRA EXTRACRAN SEL COM CAROTID INNOMINATE BILAT MOD SED  04/23/2018   IR ANGIO VERTEBRAL SEL SUBCLAVIAN INNOMINATE BILAT MOD SED  04/23/2018   IR ANGIO VERTEBRAL SEL  VERTEBRAL UNI R MOD SED  03/02/2017   IR RADIOLOGIST EVAL & MGMT  04/28/2017   IR US GUIDE VASC ACCESS RIGHT  04/23/2018   RADIOLOGY WITH ANESTHESIA N/A 03/02/2017   Procedure: RADIOLOGY WITH ANESTHESIA;  Surgeon: Julieanne Cotton, MD;  Location: [facility] OR;  Service: Radiology;  Laterality: N/A;   TEE WITHOUT CARDIOVERSION N/A 11/02/2020   Procedure: TRANSESOPHAGEAL ECHOCARDIOGRAM (TEE);  Surgeon: Thurmon Fair, MD;  Location: [facility] ENDOSCOPY;  Service: Cardiovascular;  Laterality: N/A;   TONSILLECTOMY     TOTAL ABDOMINAL HYSTERECTOMY     & BSO for fibroids   Social History:  reports that she quit smoking about 32 years ago. Her smoking use included cigarettes. She has never used smokeless tobacco. She reports that she does not drink alcohol and does not use drugs.  Allergies  Allergen Reactions   Definity [Perflutren Lipid Microsphere] Other (See Comments)    Patient experienced back pain with injection   Pletal [Cilostazol] Other (See Comments)    Made pt "feel funny"    Family History  Problem Relation Age of Onset   Diabetes Mother    Hypertension Mother    Stroke Brother        ?> 38   Coronary artery disease Brother        stent in 23s   Cancer Neg Hx     Prior to Admission medications   Medication Sig Start Date End Date Taking? Authorizing Provider  acidophilus (RISAQUAD) CAPS capsule Take 2 capsules by mouth 3 (three) times daily. 04/30/22   Arnetha Courser, MD  aspirin EC 81 MG tablet Take 81 mg by mouth daily. Swallow whole.    [provider]  atorvastatin (LIPITOR) 40 MG tablet Take 2 tablets (80 mg total) by mouth daily. 01/16/22   Pincus Sanes, MD  dorzolamide-timolol (COSOPT) 2-0.5 % ophthalmic solution Place 1 drop into both eyes in the morning, at noon, and at bedtime.    [provider]  ELIQUIS 5 MG TABS tablet TAKE 1 TABLET(5 MG) BY MOUTH TWICE DAILY 05/21/22   Pincus Sanes, MD  levothyroxine (SYNTHROID) 112 MCG tablet Take 1 tablet (112 mcg  total) by mouth daily. 06/28/22   Rolly Salter, MD  MYRBETRIQ 25 MG TB24 tablet TAKE 1 TABLET(25 MG) BY MOUTH DAILY 04/30/22   Pincus Sanes, MD  Netarsudil Dimesylate (RHOPRESSA) 0.02 % SOLN Place 1 drop into both eyes every evening.    [provider]  nitroGLYCERIN (NITROSTAT) 0.4 MG SL tablet Place 1 tablet (0.4 mg total) under the tongue every 5 (five) minutes as needed for chest pain. 09/08/15   Little Ishikawa, NP  polyethylene glycol (MIRALAX) 17 g packet Take one dose 3 times a day until bowel clear, maximum of 3 consecutive days. Patient taking differently: Take 17 g by mouth daily as needed for mild constipation. 10/30/20   Elpidio Anis, PA-C  sertraline (ZOLOFT) 100 MG tablet  Take 1 tablet (100 mg total) by mouth daily. 05/24/21   Pincus Sanes, MD  spironolactone (ALDACTONE) 25 MG tablet TAKE 1 TABLET(25 MG) BY MOUTH DAILY Patient taking differently: Take 25 mg by mouth daily. 09/26/21   Burns, Bobette Mo, MD  Travoprost, BAK Free, (TRAVATAN Z) 0.004 % SOLN ophthalmic solution Place 1 drop into both eyes at bedtime.    [provider]    Physical Exam: Vitals:   06/29/22 1700 06/29/22 1715 06/29/22 1730 06/29/22 1740  BP:      Pulse: 65 63 63   Resp: 18 17 17    Temp:    (!) 97.5 F (36.4 C)  TempSrc:    Oral  SpO2: 100% 100% 100%   Weight:      Height:       Physical Examination: General appearance - chronically ill appearing Chest - clear to auscultation, no wheezes, rales or rhonchi, symmetric air entry Heart -irregularly irregular Abdomen - soft, nontender, nondistended, no masses or organomegaly Extremities - pedal edema trace  Data Reviewed: Results for orders placed or performed during the hospital encounter of 06/29/22 (from the past 24 hour(s))  Rapid urine drug screen (hospital performed)     Status: None   Collection Time: 06/29/22  1:51 PM  Result Value Ref Range   Opiates NONE DETECTED NONE DETECTED   Cocaine NONE DETECTED NONE DETECTED    Benzodiazepines NONE DETECTED NONE DETECTED   Amphetamines NONE DETECTED NONE DETECTED   Tetrahydrocannabinol NONE DETECTED NONE DETECTED   Barbiturates NONE DETECTED NONE DETECTED  Urinalysis, Routine w reflex microscopic -Urine, Catheterized     Status: Abnormal   Collection Time: 06/29/22  1:51 PM  Result Value Ref Range   Color, Urine YELLOW YELLOW   APPearance CLOUDY (A) CLEAR   Specific Gravity, Urine 1.041 (H) 1.005 - 1.030   pH 6.0 5.0 - 8.0   Glucose, UA NEGATIVE NEGATIVE mg/dL   Hgb urine dipstick MODERATE (A) NEGATIVE   Bilirubin Urine NEGATIVE NEGATIVE   Ketones, ur NEGATIVE NEGATIVE mg/dL   Protein, ur 30 (A) NEGATIVE mg/dL   Nitrite NEGATIVE NEGATIVE   Leukocytes,Ua LARGE (A) NEGATIVE   RBC / HPF 21-50 0 - 5 RBC/hpf   WBC, UA >50 0 - 5 WBC/hpf   Bacteria, UA FEW (A) NONE SEEN   Squamous Epithelial / HPF 0-5 0 - 5 /HPF   Mucus PRESENT   CBG monitoring, ED     Status: None   Collection Time: 06/29/22  3:11 PM  Result Value Ref Range   Glucose-Capillary 80 70 - 99 mg/dL  Comprehensive metabolic panel     Status: Abnormal   Collection Time: 06/29/22  4:37 PM  Result Value Ref Range   Sodium 139 135 - 145 mmol/L   Potassium 4.1 3.5 - 5.1 mmol/L   Chloride 106 98 - 111 mmol/L   CO2 25 22 - 32 mmol/L   Glucose, Bld 94 70 - 99 mg/dL   BUN 18 8 - 23 mg/dL   Creatinine, Ser 1.61 (H) 0.44 - 1.00 mg/dL   Calcium 8.7 (L) 8.9 - 10.3 mg/dL   Total Protein 6.8 6.5 - 8.1 g/dL   Albumin 3.3 (L) 3.5 - 5.0 g/dL   AST 29 15 - 41 U/L   ALT 20 0 - 44 U/L   Alkaline Phosphatase 102 38 - 126 U/L   Total Bilirubin 0.8 0.3 - 1.2 mg/dL   GFR, Estimated 48 (L) >60 mL/min   Anion gap 8  5 - 15  Ethanol     Status: None   Collection Time: 06/29/22  4:37 PM  Result Value Ref Range   Alcohol, Ethyl (B) <10 <10 mg/dL  CBC with Differential/Platelet     Status: None   Collection Time: 06/29/22  4:37 PM  Result Value Ref Range   WBC 9.4 4.0 - 10.5 K/uL   RBC 4.17 3.87 - 5.11 MIL/uL    Hemoglobin 13.3 12.0 - 15.0 g/dL   HCT 16.1 09.6 - 04.5 %   MCV 98.3 80.0 - 100.0 fL   MCH 31.9 26.0 - 34.0 pg   MCHC 32.4 30.0 - 36.0 g/dL   RDW 40.9 81.1 - 91.4 %   Platelets 245 150 - 400 K/uL   nRBC 0.0 0.0 - 0.2 %   Neutrophils Relative % 71 %   Neutro Abs 6.7 1.7 - 7.7 K/uL   Lymphocytes Relative 20 %   Lymphs Abs 1.9 0.7 - 4.0 K/uL   Monocytes Relative 6 %   Monocytes Absolute 0.6 0.1 - 1.0 K/uL   Eosinophils Relative 2 %   Eosinophils Absolute 0.2 0.0 - 0.5 K/uL   Basophils Relative 1 %   Basophils Absolute 0.1 0.0 - 0.1 K/uL   Immature Granulocytes 0 %   Abs Immature Granulocytes 0.02 0.00 - 0.07 K/uL  TSH     Status: Abnormal   Collection Time: 06/29/22  4:37 PM  Result Value Ref Range   TSH 8.428 (H) 0.350 - 4.500 uIU/mL  T4, free     Status: None   Collection Time: 06/29/22  4:37 PM  Result Value Ref Range   Free T4 0.73 0.61 - 1.12 ng/dL   CT ANGIO HEAD NECK W WO CM  Result Date: 06/29/2022 CLINICAL DATA:  Stroke workup EXAM: CT ANGIOGRAPHY HEAD AND NECK WITH AND WITHOUT CONTRAST TECHNIQUE: Multidetector CT imaging of the head and neck was performed using the standard protocol during bolus administration of intravenous contrast. Multiplanar CT image reconstructions and MIPs were obtained to evaluate the vascular anatomy. Carotid stenosis measurements (when applicable) are obtained utilizing NASCET criteria, using the distal internal carotid diameter as the denominator. RADIATION DOSE REDUCTION: This exam was performed according to the departmental dose-optimization program which includes automated exposure control, adjustment of the mA and/or kV according to patient size and/or use of iterative reconstruction technique. CONTRAST:  75mL OMNIPAQUE IOHEXOL 350 MG/ML SOLN COMPARISON:  MRA head 11/13/2017 FINDINGS: CT HEAD FINDINGS Brain: There is no mass, hemorrhage or extra-axial collection. The size and configuration of the ventricles and extra-axial CSF spaces are normal.  There is no acute or chronic infarction. The brain parenchyma is normal. Skull: The visualized skull base, calvarium and extracranial soft tissues are normal. Sinuses/Orbits: No fluid levels or advanced mucosal thickening of the visualized paranasal sinuses. No mastoid or middle ear effusion. The orbits are normal. CTA NECK FINDINGS SKELETON: There is no bony spinal canal stenosis. No lytic or blastic lesion. OTHER NECK: Normal pharynx, larynx and major salivary glands. No cervical lymphadenopathy. Unremarkable thyroid gland. UPPER CHEST: No pneumothorax or pleural effusion. No nodules or masses. AORTIC ARCH: Outside the field of view RIGHT CAROTID SYSTEM: No dissection, occlusion or aneurysm. Mild atherosclerotic calcification at the carotid bifurcation without hemodynamically significant stenosis. LEFT CAROTID SYSTEM: No dissection, occlusion or aneurysm. Mild atherosclerotic calcification at the carotid bifurcation without hemodynamically significant stenosis. VERTEBRAL ARTERIES: Poor enhancement of both vertebral arteries along their entire course CTA HEAD FINDINGS POSTERIOR CIRCULATION: --Vertebral arteries: Poor enhancement of  both vertebral arteries with a diminutive basilar artery. --Inferior cerebellar arteries: Normal. --Basilar artery: Diminutive but patent. There are bilateral posterior communicating arteries. --Superior cerebellar arteries: Normal. --Posterior cerebral arteries (PCA): Normal. ANTERIOR CIRCULATION: --Intracranial internal carotid arteries: Atherosclerotic calcification of the internal carotid arteries at the skull base with moderate bilateral cavernous segment stenosis. --Anterior cerebral arteries (ACA): Normal. Both A1 segments are present. Patent anterior communicating artery (a-comm). --Middle cerebral arteries (MCA): Normal. VENOUS SINUSES: As permitted by contrast timing, patent. ANATOMIC VARIANTS: None Review of the MIP images confirms the above findings. IMPRESSION: 1. No  emergent large vessel occlusion. 2. Poor enhancement of both vertebral arteries along their entire course, favored to be chronic. Appearance is similar to the MRA head of 11/13/2017, allowing for differences in modality. 3. Atherosclerotic calcification of the internal carotid arteries at the skull base with moderate bilateral cavernous segment stenosis. 4. Mild atherosclerotic calcification at the carotid bifurcations without hemodynamically significant stenosis. Electronically Signed   By: Deatra Robinson M.D.   On: 06/29/2022 19:12   CT HEAD WO CONTRAST  Result Date: 06/29/2022 CLINICAL DATA:  Altered mental status. EXAM: CT HEAD WITHOUT CONTRAST TECHNIQUE: Contiguous axial images were obtained from the base of the skull through the vertex without intravenous contrast. RADIATION DOSE REDUCTION: This exam was performed according to the departmental dose-optimization program which includes automated exposure control, adjustment of the mA and/or kV according to patient size and/or use of iterative reconstruction technique. COMPARISON:  06/26/2022 FINDINGS: Brain: No evidence of intracranial hemorrhage, acute infarction, hydrocephalus, extra-axial collection, or mass lesion/mass effect. Mild diffuse cerebral atrophy and severe chronic small vessel disease unchanged in appearance. Old infarcts are again seen involving the left posterior parietal lobe and right basal ganglia. Vascular:  No hyperdense vessel or other acute findings. Skull: No evidence of fracture or other significant bone abnormality. Sinuses/Orbits:  No acute findings. Other: None. IMPRESSION: No acute intracranial abnormality. Stable cerebral atrophy, chronic small vessel disease, and old infarcts. Electronically Signed   By: Danae Orleans M.D.   On: 06/29/2022 14:55   DG Chest 2 View  Result Date: 06/29/2022 CLINICAL DATA:  Altered mental status EXAM: CHEST - 2 VIEW COMPARISON:  06/26/2022 FINDINGS: Mild cardiomegaly, stable. Coronary artery  stents. No focal airspace consolidation, pleural effusion, or pneumothorax. IMPRESSION: No active cardiopulmonary disease. Electronically Signed   By: Duanne Guess D.O.   On: 06/29/2022 14:41   MR BRAIN WO CONTRAST  Result Date: 06/26/2022 CLINICAL DATA:  Neuro deficit, acute, stroke suspected Not talking, altered mental status. History of strokes on CT EXAM: MRI HEAD WITHOUT CONTRAST TECHNIQUE: Multiplanar, multiecho pulse sequences of the brain and surrounding structures were obtained without intravenous contrast. COMPARISON:  None Available. FINDINGS: Brain: Remote left MCA territory infarcts. Remote left thalamic and bilateral basal ganglia lacunar infarcts. Multiple remote cerebellar infarcts. Remote right frontal cortical infarct. Additional scattered T2/FLAIR hyperintensities line matter, compatible with chronic microvascular ischemic disease. Cerebral atrophy. No evidence of acute hemorrhage, acute infarct, mass lesion, midline shift or hydrocephalus. Vascular: Abnormal flow void within the proximal basilar artery. Skull and upper cervical spine: Normal marrow signal. Sinuses/Orbits: Largely clear sinuses.  No acute orbital findings. Other: No mastoid effusions. IMPRESSION: 1. No evidence of acute infarct. 2. Abnormal flow void within the proximal basilar artery. Recommend CTA or MRA to further evaluate for stenosis, occlusion, or thrombus. 3. Many remote infarcts and chronic microvascular ischemic change. 4.  Cerebral Atrophy (ICD10-G31.9). Electronically Signed   By: Feliberto Harts M.D.   On: 06/26/2022  18:22   DG Chest Portable 1 View  Result Date: 06/26/2022 CLINICAL DATA:  Altered mental status EXAM: PORTABLE CHEST 1 VIEW COMPARISON:  02/28/2022. FINDINGS: Cardiac silhouette is prominent. There is a possible nodule that appears to be superimposed with wires in the right base. No pneumothorax. Normal pulmonary vasculature. No definite pleural effusion. Osseous structures grossly intact.  IMPRESSION: Enlarged cardiac silhouette. Possible nodule in the right base which was obscured by overlying opacities. Electronically Signed   By: Layla Maw M.D.   On: 06/26/2022 10:47   CT HEAD WO CONTRAST ( )  Result Date: 06/26/2022 CLINICAL DATA:  Altered mental status EXAM: CT HEAD WITHOUT CONTRAST TECHNIQUE: Contiguous axial images were obtained from the base of the skull through the vertex without intravenous contrast. RADIATION DOSE REDUCTION: This exam was performed according to the departmental dose-optimization program which includes automated exposure control, adjustment of the mA and/or kV according to patient size and/or use of iterative reconstruction technique. COMPARISON:  CT head 04/23/22 FINDINGS: Brain: Redemonstrated chronic left parietal lobe and right frontal lobe/basal ganglia infarcts. There is also a chronic appearing infarct in the left corona radiata. Chronic bilateral cerebellar infarcts. No hemorrhage. No hydrocephalus. No extra-axial fluid collection. There is mild ex vacuo dilatation of the left occipital horn. Vascular: No hyperdense vessel or unexpected calcification. Skull: Normal. Negative for fracture or focal lesion. Sinuses/Orbits: No middle ear or mastoid effusion. Paranasal sinuses are clear. Orbits are unremarkable. Other: None. IMPRESSION: 1. No acute intracranial abnormality. 2. Sequela of severe chronic microvascular ischemic change with multiple chronic infarcts, as above. Electronically Signed   By: Lorenza Cambridge M.D.   On: 06/26/2022 10:13    EKG-atrial fibrillation, anterior infarct  Assessment and Plan: * TIA (transient ischemic attack) Initially thought to be this Neurology consulted and ED and felt the patient had metabolic encephalopathy Patient is maximally treated for prevention of strokes including statin, antiplatelet, Eliquis Has significant atherosclerosis of the basilar artery but no acute blockage I would not be a candidate for  intervention unless this acutely occludes, which is not present on CTA today Check EEG Check B12 level Treat UTI Improved thyroid function  UTI (urinary tract infection) At last hospitalization growing Enterococcus though the culture count is low suspect this may be contributing Continue amoxicillin 500 mg twice daily  Stage 3 chronic kidney disease Creatinine appears to be at baseline Avoid nephrotoxic agents Trend  Coronary artery disease involving native coronary artery without angina pectoris On statin and aspirin daily  Atrial fibrillation Rate controlled On Eliquis  Small vessel disease, cerebrovascular Appears to be stable  Acute thromboembolism of deep veins of lower extremity On Eliquis  Peripheral vascular disease (HCC) On aspirin  Essential hypertension Continue Aldactone  Dyslipidemia Continue Lipitor  Hypothyroidism Recent increase to 112 mcg daily of Synthroid Has elevated TSH with a low free T3      Advance Care Planning:   Code Status: Prior Full in lieu of no family to discuss this with  Consults: Neurology  Family Communication: Patient at bedside  Severity of Illness: The appropriate patient status for this patient is OBSERVATION. Observation status is judged to be reasonable and necessary in order to provide the required intensity of service to ensure the patient's safety. The patient's presenting symptoms, physical exam findings, and initial radiographic and laboratory data in the context of their medical condition is felt to place them at decreased risk for further clinical deterioration. Furthermore, it is anticipated that the patient will be medically stable for  discharge from the hospital within 2 midnights of admission.   Author: Reva Bores, MD 06/29/2022 7:10 PM  For on call review [email or web address].

## 2022-06-29 NOTE — Assessment & Plan Note (Signed)
On Eliquis

## 2022-06-29 NOTE — Assessment & Plan Note (Signed)
At last hospitalization growing Enterococcus though the culture count is low suspect this may be contributing Continue amoxicillin 500 mg twice daily

## 2022-06-29 NOTE — Assessment & Plan Note (Signed)
Recent increase to 112 mcg daily of Synthroid Has elevated TSH with a low free T3

## 2022-06-29 NOTE — Assessment & Plan Note (Addendum)
On statin and aspirin daily

## 2022-06-29 NOTE — ED Notes (Signed)
ED TO INPATIENT HANDOFF REPORT  ED Nurse Name and Phone #: [phone]  S Name/Age/Gender Yvonne Bailey 82 y.o. female Room/Bed: 011C/011C  Code Status   Code Status: Full Code  Home/SNF/Other Home Patient oriented to: self Is this baseline? Yes   Triage Complete: Triage complete  Chief Complaint TIA (transient ischemic attack) [G45.9]  Triage Note Per [facility] pt coming from home. Son called ems stating patient is normally verbal and has only said one word today. Patient has dementia at baseline.    Allergies Allergies  Allergen Reactions   Definity [Perflutren Lipid Microsphere] Other (See Comments)    Patient experienced back pain with injection   Pletal [Cilostazol] Other (See Comments)    Made pt "feel funny"    Level of Care/Admitting Diagnosis ED Disposition     ED Disposition  Admit   Condition  --   Comment  Hospital Area: [facility] [[id number]]  Level of Care: Telemetry Medical [104]  May place patient in observation at [facility] or [facility] if equivalent level of care is available:: Yes  Covid Evaluation: Asymptomatic - no recent exposure (last 10 days) testing not required  Diagnosis: TIA (transient ischemic attack) [960454]  Admitting Physician: Samara Snide  Attending Physician: Samara Snide          B Medical/Surgery History Past Medical History:  Diagnosis Date   Bilateral leg edema 07/19/2015   CAD (coronary artery disease)    a. anterior MI s/p PCI in 1992. b. cath 08/2015 with severe three-vessel CAD turned down for CABG and underwent DESx5 to prox Cx/OM2/D1/oRCA/mRCA   Carotid artery disease    a. carotid duplex 03/2015 showed 1-39% BICA, normal subclavian arteries, chronically occluded left vertebral, f/u recommended only PRN.   DVT (deep venous thrombosis)    X1   History of nuclear stress test    a. Myoview 6/17: EF 20-25%, mid anteroseptal, apical anterior, apical septal, apical inferior, apical  lateral and apical scar, no ischemia, intermediate risk   HTN (hypertension)    Hyperlipidemia    Hypokalemia    Hypothyroidism    Ischemic cardiomyopathy    a. Echo 6/17: EF 20-25%, apex appears akinetic, MAC, moderate MR, moderate LAE, mild RVE, trivial PI, PASP 47 mmHg (needs repeat with Definity contrast).  b. Limited echo with Definity contrast 7/17: EF 25-30%, moderate to severe LAE. c. Limited Echo 2018 showed EF 40-45%.   Lacunar infarction 12/17/2012   Dr Terrace Arabia, Neurology    Leukocytosis    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   Longstanding persistent atrial fibrillation    MI (myocardial infarction) 1992   PAD (peripheral artery disease)    Right SFA occlusion, severe disease left CFA and SFA   Prediabetes 07/28/2016   Stage 3 chronic kidney disease    Stroke    a. 02/2017 in setting of noncompliance with Eliquis   TIA (transient ischemic attack)    Tricuspid regurgitation    Past Surgical History:  Procedure Laterality Date   APPENDECTOMY     at hysterectomy and USO for fibroids, Dr. Kizzie Bane   CARDIAC CATHETERIZATION  1992   Dr Charlies Constable   CARDIAC CATHETERIZATION N/A 09/06/2015   Procedure: Right/Left Heart Cath and Coronary Angiography;  Surgeon: Kathleene Hazel, MD;  Location: [facility] INVASIVE CV LAB;  Service: Cardiovascular;  Laterality: N/A;   CARDIAC CATHETERIZATION N/A 09/07/2015   Procedure: Coronary Stent Intervention;  Surgeon: Kathleene Hazel, MD;  Location: [facility] INVASIVE CV LAB;  Service: Cardiovascular;  Laterality: N/A;   COLONOSCOPY     negative; 2008, Dr. Lina Sar   fracture LLE     '94; pinned   IR ANGIO INTRA EXTRACRAN SEL COM CAROTID INNOMINATE BILAT MOD SED  03/02/2017   IR ANGIO INTRA EXTRACRAN SEL COM CAROTID INNOMINATE BILAT MOD SED  04/23/2018   IR ANGIO VERTEBRAL SEL SUBCLAVIAN INNOMINATE BILAT MOD SED  04/23/2018   IR ANGIO VERTEBRAL SEL VERTEBRAL UNI R MOD SED  03/02/2017   IR RADIOLOGIST EVAL & MGMT  04/28/2017   IR US GUIDE VASC  ACCESS RIGHT  04/23/2018   RADIOLOGY WITH ANESTHESIA N/A 03/02/2017   Procedure: RADIOLOGY WITH ANESTHESIA;  Surgeon: Julieanne Cotton, MD;  Location: [facility] OR;  Service: Radiology;  Laterality: N/A;   TEE WITHOUT CARDIOVERSION N/A 11/02/2020   Procedure: TRANSESOPHAGEAL ECHOCARDIOGRAM (TEE);  Surgeon: Thurmon Fair, MD;  Location: [facility] ENDOSCOPY;  Service: Cardiovascular;  Laterality: N/A;   TONSILLECTOMY     TOTAL ABDOMINAL HYSTERECTOMY     & BSO for fibroids     A IV Location/Drains/Wounds Patient Lines/Drains/Airways Status     Active Line/Drains/Airways     Name Placement date Placement time Site Days   Peripheral IV 06/29/22 20 G Anterior;Left;Proximal Forearm 06/29/22  1639  Forearm  less than 1            Intake/Output Last 24 hours No intake or output data in the 24 hours ending 06/29/22 2259  Labs/Imaging Results for orders placed or performed during the hospital encounter of 06/29/22 (from the past 48 hour(s))  Rapid urine drug screen (hospital performed)     Status: None   Collection Time: 06/29/22  1:51 PM  Result Value Ref Range   Opiates NONE DETECTED NONE DETECTED   Cocaine NONE DETECTED NONE DETECTED   Benzodiazepines NONE DETECTED NONE DETECTED   Amphetamines NONE DETECTED NONE DETECTED   Tetrahydrocannabinol NONE DETECTED NONE DETECTED   Barbiturates NONE DETECTED NONE DETECTED    Comment: (NOTE) DRUG SCREEN FOR MEDICAL PURPOSES ONLY.  IF CONFIRMATION IS NEEDED FOR ANY PURPOSE, NOTIFY LAB WITHIN 5 DAYS.  LOWEST DETECTABLE LIMITS FOR URINE DRUG SCREEN Drug Class                     Cutoff (ng/mL) Amphetamine and metabolites    1000 Barbiturate and metabolites    200 Benzodiazepine                 200 Opiates and metabolites        300 Cocaine and metabolites        300 THC                            50 Performed at [facility], [address], [place], [place] [address]   Urinalysis, Routine w reflex microscopic -Urine, Catheterized      Status: Abnormal   Collection Time: 06/29/22  1:51 PM  Result Value Ref Range   Color, Urine YELLOW YELLOW   APPearance CLOUDY (A) CLEAR   Specific Gravity, Urine 1.041 (H) 1.005 - 1.030   pH 6.0 5.0 - 8.0   Glucose, UA NEGATIVE NEGATIVE mg/dL   Hgb urine dipstick MODERATE (A) NEGATIVE   Bilirubin Urine NEGATIVE NEGATIVE   Ketones, ur NEGATIVE NEGATIVE mg/dL   Protein, ur 30 (A) NEGATIVE mg/dL   Nitrite NEGATIVE NEGATIVE   Leukocytes,Ua LARGE (A) NEGATIVE   RBC / HPF 21-50 0 -  5 RBC/hpf   WBC, UA >50 0 - 5 WBC/hpf   Bacteria, UA FEW (A) NONE SEEN   Squamous Epithelial / HPF 0-5 0 - 5 /HPF   Mucus PRESENT     Comment: Performed at [facility], [address], [place], [place] [address]  CBG monitoring, ED     Status: None   Collection Time: 06/29/22  3:11 PM  Result Value Ref Range   Glucose-Capillary 80 70 - 99 mg/dL    Comment: Glucose reference range applies only to samples taken after fasting for at least 8 hours.  Comprehensive metabolic panel     Status: Abnormal   Collection Time: 06/29/22  4:37 PM  Result Value Ref Range   Sodium 139 135 - 145 mmol/L   Potassium 4.1 3.5 - 5.1 mmol/L   Chloride 106 98 - 111 mmol/L   CO2 25 22 - 32 mmol/L   Glucose, Bld 94 70 - 99 mg/dL    Comment: Glucose reference range applies only to samples taken after fasting for at least 8 hours.   BUN 18 8 - 23 mg/dL   Creatinine, Ser 6.04 (H) 0.44 - 1.00 mg/dL   Calcium 8.7 (L) 8.9 - 10.3 mg/dL   Total Protein 6.8 6.5 - 8.1 g/dL   Albumin 3.3 (L) 3.5 - 5.0 g/dL   AST 29 15 - 41 U/L   ALT 20 0 - 44 U/L   Alkaline Phosphatase 102 38 - 126 U/L   Total Bilirubin 0.8 0.3 - 1.2 mg/dL   GFR, Estimated 48 (L) >60 mL/min    Comment: (NOTE) Calculated using the CKD-EPI Creatinine Equation (2021)    Anion gap 8 5 - 15    Comment: Performed at [facility], [address] [place], [place] [address]  Ethanol     Status: None   Collection Time: 06/29/22  4:37 PM  Result Value Ref Range    Alcohol, Ethyl (B) <10 <10 mg/dL    Comment: (NOTE) Lowest detectable limit for serum alcohol is 10 mg/dL.  For medical purposes only. Performed at [facility], [address] [place], [place] [address]   CBC with Differential/Platelet     Status: None   Collection Time: 06/29/22  4:37 PM  Result Value Ref Range   WBC 9.4 4.0 - 10.5 K/uL   RBC 4.17 3.87 - 5.11 MIL/uL   Hemoglobin 13.3 12.0 - 15.0 g/dL   HCT 78.2 95.6 - 21.3 %   MCV 98.3 80.0 - 100.0 fL   MCH 31.9 26.0 - 34.0 pg   MCHC 32.4 30.0 - 36.0 g/dL   RDW 08.6 57.8 - 46.9 %   Platelets 245 150 - 400 K/uL   nRBC 0.0 0.0 - 0.2 %   Neutrophils Relative % 71 %   Neutro Abs 6.7 1.7 - 7.7 K/uL   Lymphocytes Relative 20 %   Lymphs Abs 1.9 0.7 - 4.0 K/uL   Monocytes Relative 6 %   Monocytes Absolute 0.6 0.1 - 1.0 K/uL   Eosinophils Relative 2 %   Eosinophils Absolute 0.2 0.0 - 0.5 K/uL   Basophils Relative 1 %   Basophils Absolute 0.1 0.0 - 0.1 K/uL   Immature Granulocytes 0 %   Abs Immature Granulocytes 0.02 0.00 - 0.07 K/uL    Comment: Performed at [facility], [address], [place], [place] [address]  TSH     Status: Abnormal   Collection Time: 06/29/22  4:37  PM  Result Value Ref Range   TSH 8.428 (H) 0.350 - 4.500 uIU/mL    Comment: Performed by a 3rd Generation assay with a functional sensitivity of <=0.01 uIU/mL. Performed at [facility], [address], [place], [place] [address]   T4, free     Status: None   Collection Time: 06/29/22  4:37 PM  Result Value Ref Range   Free T4 0.73 0.61 - 1.12 ng/dL    Comment: (NOTE) Biotin ingestion may interfere with free T4 tests. If the results are inconsistent with the TSH level, previous test results, or the clinical presentation, then consider biotin interference. If needed, order repeat testing after stopping biotin. Performed at [facility], [address] [place], [place] [address]    CT ANGIO HEAD NECK W WO CM  Result Date:  06/29/2022 CLINICAL DATA:  Stroke workup EXAM: CT ANGIOGRAPHY HEAD AND NECK WITH AND WITHOUT CONTRAST TECHNIQUE: Multidetector CT imaging of the head and neck was performed using the standard protocol during bolus administration of intravenous contrast. Multiplanar CT image reconstructions and MIPs were obtained to evaluate the vascular anatomy. Carotid stenosis measurements (when applicable) are obtained utilizing NASCET criteria, using the distal internal carotid diameter as the denominator. RADIATION DOSE REDUCTION: This exam was performed according to the departmental dose-optimization program which includes automated exposure control, adjustment of the mA and/or kV according to patient size and/or use of iterative reconstruction technique. CONTRAST:  75mL OMNIPAQUE IOHEXOL 350 MG/ML SOLN COMPARISON:  MRA head 11/13/2017 FINDINGS: CT HEAD FINDINGS Brain: There is no mass, hemorrhage or extra-axial collection. The size and configuration of the ventricles and extra-axial CSF spaces are normal. There is no acute or chronic infarction. The brain parenchyma is normal. Skull: The visualized skull base, calvarium and extracranial soft tissues are normal. Sinuses/Orbits: No fluid levels or advanced mucosal thickening of the visualized paranasal sinuses. No mastoid or middle ear effusion. The orbits are normal. CTA NECK FINDINGS SKELETON: There is no bony spinal canal stenosis. No lytic or blastic lesion. OTHER NECK: Normal pharynx, larynx and major salivary glands. No cervical lymphadenopathy. Unremarkable thyroid gland. UPPER CHEST: No pneumothorax or pleural effusion. No nodules or masses. AORTIC ARCH: Outside the field of view RIGHT CAROTID SYSTEM: No dissection, occlusion or aneurysm. Mild atherosclerotic calcification at the carotid bifurcation without hemodynamically significant stenosis. LEFT CAROTID SYSTEM: No dissection, occlusion or aneurysm. Mild atherosclerotic calcification at the carotid bifurcation  without hemodynamically significant stenosis. VERTEBRAL ARTERIES: Poor enhancement of both vertebral arteries along their entire course CTA HEAD FINDINGS POSTERIOR CIRCULATION: --Vertebral arteries: Poor enhancement of both vertebral arteries with a diminutive basilar artery. --Inferior cerebellar arteries: Normal. --Basilar artery: Diminutive but patent. There are bilateral posterior communicating arteries. --Superior cerebellar arteries: Normal. --Posterior cerebral arteries (PCA): Normal. ANTERIOR CIRCULATION: --Intracranial internal carotid arteries: Atherosclerotic calcification of the internal carotid arteries at the skull base with moderate bilateral cavernous segment stenosis. --Anterior cerebral arteries (ACA): Normal. Both A1 segments are present. Patent anterior communicating artery (a-comm). --Middle cerebral arteries (MCA): Normal. VENOUS SINUSES: As permitted by contrast timing, patent. ANATOMIC VARIANTS: None Review of the MIP images confirms the above findings. IMPRESSION: 1. No emergent large vessel occlusion. 2. Poor enhancement of both vertebral arteries along their entire course, favored to be chronic. Appearance is similar to the MRA head of 11/13/2017, allowing for differences in modality. 3. Atherosclerotic calcification of the internal carotid arteries at the skull base with moderate bilateral cavernous segment stenosis. 4. Mild atherosclerotic calcification at the carotid  bifurcations without hemodynamically significant stenosis. Electronically Signed   By: Deatra Robinson M.D.   On: 06/29/2022 19:12   CT HEAD WO CONTRAST  Result Date: 06/29/2022 CLINICAL DATA:  Altered mental status. EXAM: CT HEAD WITHOUT CONTRAST TECHNIQUE: Contiguous axial images were obtained from the base of the skull through the vertex without intravenous contrast. RADIATION DOSE REDUCTION: This exam was performed according to the departmental dose-optimization program which includes automated exposure control,  adjustment of the mA and/or kV according to patient size and/or use of iterative reconstruction technique. COMPARISON:  06/26/2022 FINDINGS: Brain: No evidence of intracranial hemorrhage, acute infarction, hydrocephalus, extra-axial collection, or mass lesion/mass effect. Mild diffuse cerebral atrophy and severe chronic small vessel disease unchanged in appearance. Old infarcts are again seen involving the left posterior parietal lobe and right basal ganglia. Vascular:  No hyperdense vessel or other acute findings. Skull: No evidence of fracture or other significant bone abnormality. Sinuses/Orbits:  No acute findings. Other: None. IMPRESSION: No acute intracranial abnormality. Stable cerebral atrophy, chronic small vessel disease, and old infarcts. Electronically Signed   By: Danae Orleans M.D.   On: 06/29/2022 14:55   DG Chest 2 View  Result Date: 06/29/2022 CLINICAL DATA:  Altered mental status EXAM: CHEST - 2 VIEW COMPARISON:  06/26/2022 FINDINGS: Mild cardiomegaly, stable. Coronary artery stents. No focal airspace consolidation, pleural effusion, or pneumothorax. IMPRESSION: No active cardiopulmonary disease. Electronically Signed   By: Duanne Guess D.O.   On: 06/29/2022 14:41    Pending Labs Unresulted Labs (From admission, onward)     Start     Ordered   06/30/22 0500  Vitamin B12  Tomorrow morning,   R        06/29/22 2003   06/29/22 1351  Blood gas, venous  Once,   R        06/29/22 1352            Vitals/Pain Today's Vitals   06/29/22 2130 06/29/22 2200 06/29/22 2230 06/29/22 2232  BP: (!) 143/68 (!) 154/75 115/62   Pulse: 69 61 65   Resp: 16 14 17    Temp:    97.7 F (36.5 C)  TempSrc:    Oral  SpO2: 100% 100% 100%   Weight:      Height:        Isolation Precautions No active isolations  Medications Medications  aspirin EC tablet 81 mg (81 mg Oral Given 06/29/22 2153)  amoxicillin (AMOXIL) capsule 500 mg (500 mg Oral Given 06/29/22 2153)  atorvastatin (LIPITOR)  tablet 80 mg (has no administration in time range)  nitroGLYCERIN (NITROSTAT) SL tablet 0.4 mg (has no administration in time range)  spironolactone (ALDACTONE) tablet 25 mg (has no administration in time range)  sertraline (ZOLOFT) tablet 100 mg (has no administration in time range)  levothyroxine (SYNTHROID) tablet 112 mcg (has no administration in time range)  acidophilus (RISAQUAD) capsule 2 capsule (has no administration in time range)  polyethylene glycol (MIRALAX / GLYCOLAX) packet 17 g (has no administration in time range)  mirabegron ER (MYRBETRIQ) tablet 25 mg (25 mg Oral Given 06/29/22 2153)  apixaban (ELIQUIS) tablet 5 mg (5 mg Oral Given 06/29/22 2153)  dorzolamide-timolol (COSOPT) 2-0.5 % ophthalmic solution 1 drop (has no administration in time range)  Netarsudil Dimesylate 0.02 % SOLN 1 drop (has no administration in time range)  latanoprost (XALATAN) 0.005 % ophthalmic solution 1 drop (has no administration in time range)  sodium chloride flush (NS) 0.9 % injection 3 mL (3 mLs Intravenous  Given 06/29/22 2232)  sodium chloride flush (NS) 0.9 % injection 3 mL (3 mLs Intravenous Given 06/29/22 2153)  0.9 %  sodium chloride infusion (has no administration in time range)  acetaminophen (TYLENOL) tablet 650 mg (has no administration in time range)    Or  acetaminophen (TYLENOL) suppository 650 mg (has no administration in time range)  fentaNYL (SUBLIMAZE) injection 12.5-50 mcg (has no administration in time range)  ondansetron (ZOFRAN) tablet 4 mg (has no administration in time range)    Or  ondansetron (ZOFRAN) injection 4 mg (has no administration in time range)  metoprolol tartrate (LOPRESSOR) injection 5 mg (has no administration in time range)  iohexol (OMNIPAQUE) 350 MG/ML injection 75 mL (75 mLs Intravenous Contrast Given 06/29/22 1903)    Mobility non-ambulatory     Focused Assessments Neuro Assessment Handoff:  Swallow screen pass? No          Neuro Assessment:    Neuro Checks:      Has TPA been given? No If patient is a Neuro Trauma and patient is going to OR before floor call report to 4N Charge nurse: [phone] or [phone]   R Recommendations: See Admitting Provider Note  Report given to:   Additional Notes:

## 2022-06-29 NOTE — Assessment & Plan Note (Signed)
Rate controlled - On Eliquis  

## 2022-06-29 NOTE — Assessment & Plan Note (Signed)
On aspirin. °

## 2022-06-29 NOTE — ED Triage Notes (Signed)
Per [facility] pt coming from home. Son called ems stating patient is normally verbal and has only said one word today. Patient has dementia at baseline.

## 2022-06-29 NOTE — Consult Note (Addendum)
Neurology Consultation  Reason for Consult: Altered mental status Referring Physician: Dr. Jarold Motto  CC: AMS  History is obtained from: Chart  HPI: Yvonne Bailey is a 82 y.o. female past medical history of dementia, requires a walker to walk, CAD, prior strokes and TIAs with unclear residual deficits (family not at bedside-2020 note mentions word finding difficulty and paraphasic errors), hypertension, hyperlipidemia, frequent UTIs peripheral arterial disease-currently on aspirin and Eliquis with questionable compliance to medications, presenting for evaluation of altered mental status According to chart review, she had an episode for about 1 hour when she was very lethargic and not talking.  She has foul-smelling urine and confusion.  Has been confused and weak starting last Monday, went to the hospital, was found to have abnormal thyroid studies, discharged on 06/27/2022.  Today upon waking up she would not move or talk.  Knees were drawn up and she was Bhagat and not according to the family. She typically she walks with a walker. She does have some ongoing incontinence as well. She is unable to provide any reliable history-does not know why she is in the hospital   LKW: Unclear IV thrombolysis given?  No stroke on MRI, unclear last known well Premorbid modified Rankin scale (mRS): 4   ROS: Full ROS was performed and is negative except as noted in the HPI.   Past Medical History:  Diagnosis Date   Bilateral leg edema 07/19/2015   CAD (coronary artery disease)    a. anterior MI s/p PCI in 1992. b. cath 08/2015 with severe three-vessel CAD turned down for CABG and underwent DESx5 to prox Cx/OM2/D1/oRCA/mRCA   Carotid artery disease    a. carotid duplex 03/2015 showed 1-39% BICA, normal subclavian arteries, chronically occluded left vertebral, f/u recommended only PRN.   DVT (deep venous thrombosis)    X1   History of nuclear stress test    a. Myoview 6/17: EF 20-25%, mid  anteroseptal, apical anterior, apical septal, apical inferior, apical lateral and apical scar, no ischemia, intermediate risk   HTN (hypertension)    Hyperlipidemia    Hypokalemia    Hypothyroidism    Ischemic cardiomyopathy    a. Echo 6/17: EF 20-25%, apex appears akinetic, MAC, moderate MR, moderate LAE, mild RVE, trivial PI, PASP 47 mmHg (needs repeat with Definity contrast).  b. Limited echo with Definity contrast 7/17: EF 25-30%, moderate to severe LAE. c. Limited Echo 2018 showed EF 40-45%.   Lacunar infarction 12/17/2012   Dr Terrace Arabia, Neurology    Leukocytosis    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   Longstanding persistent atrial fibrillation    MI (myocardial infarction) 1992   PAD (peripheral artery disease)    Right SFA occlusion, severe disease left CFA and SFA   Prediabetes 07/28/2016   Stage 3 chronic kidney disease    Stroke    a. 02/2017 in setting of noncompliance with Eliquis   TIA (transient ischemic attack)    Tricuspid regurgitation      Family History  Problem Relation Age of Onset   Diabetes Mother    Hypertension Mother    Stroke Brother        ?> 55   Coronary artery disease Brother        stent in 53s   Cancer Neg Hx      Social History:   reports that she quit smoking about 32 years ago. Her smoking use included cigarettes. She has never used smokeless tobacco. She reports that she does not drink alcohol  and does not use drugs.  Medications  Current Facility-Administered Medications:    amoxicillin (AMOXIL) capsule 500 mg, 500 mg, Oral, Q12H, Rolly Salter, MD  Current Outpatient Medications:    acidophilus (RISAQUAD) CAPS capsule, Take 2 capsules by mouth 3 (three) times daily., Disp: , Rfl:    aspirin EC 81 MG tablet, Take 81 mg by mouth daily. Swallow whole., Disp: , Rfl:    atorvastatin (LIPITOR) 40 MG tablet, Take 2 tablets (80 mg total) by mouth daily., Disp: 180 tablet, Rfl: 3   dorzolamide-timolol (COSOPT) 2-0.5 % ophthalmic solution,  Place 1 drop into both eyes in the morning, at noon, and at bedtime., Disp: , Rfl:    ELIQUIS 5 MG TABS tablet, TAKE 1 TABLET(5 MG) BY MOUTH TWICE DAILY, Disp: 180 tablet, Rfl: 0   levothyroxine (SYNTHROID) 112 MCG tablet, Take 1 tablet (112 mcg total) by mouth daily., Disp: 30 tablet, Rfl: 0   MYRBETRIQ 25 MG TB24 tablet, TAKE 1 TABLET(25 MG) BY MOUTH DAILY, Disp: 30 tablet, Rfl: 5   Netarsudil Dimesylate (RHOPRESSA) 0.02 % SOLN, Place 1 drop into both eyes every evening., Disp: , Rfl:    nitroGLYCERIN (NITROSTAT) 0.4 MG SL tablet, Place 1 tablet (0.4 mg total) under the tongue every 5 (five) minutes as needed for chest pain., Disp: 25 tablet, Rfl: 1   polyethylene glycol (MIRALAX) 17 g packet, Take one dose 3 times a day until bowel clear, maximum of 3 consecutive days. (Patient taking differently: Take 17 g by mouth daily as needed for mild constipation.), Disp: 9 each, Rfl: 0   sertraline (ZOLOFT) 100 MG tablet, Take 1 tablet (100 mg total) by mouth daily., Disp: 90 tablet, Rfl: 3   spironolactone (ALDACTONE) 25 MG tablet, TAKE 1 TABLET(25 MG) BY MOUTH DAILY (Patient taking differently: Take 25 mg by mouth daily.), Disp: 90 tablet, Rfl: 1   Travoprost, BAK Free, (TRAVATAN Z) 0.004 % SOLN ophthalmic solution, Place 1 drop into both eyes at bedtime., Disp: , Rfl:    Exam: Current vital signs: BP 134/72   Pulse 63   Temp (!) 97.5 F (36.4 C) (Oral)   Resp 17   Ht  (1.702 m)   Wt 73.5 kg   SpO2 100%   BMI 25.37 kg/m  Vital signs in last 24 hours: Temp:  [97.3 F (36.3 C)-97.5 F (36.4 C)] 97.5 F (36.4 C) (04/20 1740) Pulse Rate:  [61-75] 63 (04/20 1730) Resp:  [17-23] 17 (04/20 1730) BP: (134-171)/(72-86) 134/72 (04/20 1545) SpO2:  [100 %] 100 % (04/20 1730) Weight:  [73.5 kg] 73.5 kg (04/20 1320) General: Awake alert in no distress HEENT: Normocephalic atraumatic Lungs: Clear Cardiovascular: Regular rate rhythm Abdomen distended tender Extremities warm  well-perfused Neurological exam Awake alert oriented to self, the fact that she is at [facility] and that it is the month of April. Could not tell me why she is here Poor attention concentration Speech is mildly dysarthric but she is also someone who has very poor and incomplete dentition. Cranial nerves II to XII grossly intact-poor visual acuity in the left eye. Motor examination with no drift in any of the 4 extremities Sensation intact to light touch Coordination exam difficult to perform given her extremely poor attention concentration NIH stroke scale NIHSS 1a Level of Conscious.: 0 1b LOC Questions: 0 1c LOC Commands: 0 2 Best Gaze: 0 3 Visual: 2 4 Facial Palsy: 0 5a Motor Arm - left: 0 5b Motor Arm - Right: 0 6a Motor  Leg - Left: 0 6b Motor Leg - Right: 0 7 Limb Ataxia: 0 8 Sensory: 0 9 Best Language: 0 10 Dysarthria: 1 11 Extinct. and Inatten.: 0 TOTAL: 3   Labs I have reviewed labs in epic and the results pertinent to this consultation are: CBC    Component Value Date/Time   WBC 9.4 06/29/2022 1637   RBC 4.17 06/29/2022 1637   HGB 13.3 06/29/2022 1637   HGB 13.2 08/03/2019 1409   HCT 41.0 06/29/2022 1637   HCT 40.4 08/03/2019 1409   PLT 245 06/29/2022 1637   PLT 253 08/03/2019 1409   MCV 98.3 06/29/2022 1637   MCV 98 (H) 08/03/2019 1409   MCH 31.9 06/29/2022 1637   MCHC 32.4 06/29/2022 1637   RDW 12.7 06/29/2022 1637   RDW 12.1 08/03/2019 1409   LYMPHSABS 1.9 06/29/2022 1637   MONOABS 0.6 06/29/2022 1637   EOSABS 0.2 06/29/2022 1637   BASOSABS 0.1 06/29/2022 1637    CMP     Component Value Date/Time   NA 139 06/29/2022 1637   NA 144 08/03/2019 1409   K 4.1 06/29/2022 1637   CL 106 06/29/2022 1637   CO2 25 06/29/2022 1637   GLUCOSE 94 06/29/2022 1637   BUN 18 06/29/2022 1637   BUN 22 08/03/2019 1409   CREATININE 1.15 (H) 06/29/2022 1637   CREATININE 1.14 (H) 09/15/2015 1025   CALCIUM 8.7 (L) 06/29/2022 1637   PROT 6.8 06/29/2022 1637    PROT 6.5 04/13/2018 0000   ALBUMIN 3.3 (L) 06/29/2022 1637   ALBUMIN 4.1 04/13/2018 0000   AST 29 06/29/2022 1637   ALT 20 06/29/2022 1637   ALKPHOS 102 06/29/2022 1637   BILITOT 0.8 06/29/2022 1637   BILITOT 0.7 04/13/2018 0000   GFRNONAA 48 (L) 06/29/2022 1637   GFRAA 60 08/03/2019 1409    Lipid Panel     Component Value Date/Time   CHOL 128 06/27/2022 0321   CHOL 131 06/03/2017 0921   TRIG 53 06/27/2022 0321   HDL 45 06/27/2022 0321   HDL 40 06/03/2017 0921   CHOLHDL 2.8 06/27/2022 0321   VLDL 11 06/27/2022 0321   LDLCALC 72 06/27/2022 0321   LDLCALC 72 06/03/2017 0921   TSH 8.4   Imaging I have reviewed the images obtained:  MRI brain: No evidence of acute infarction.  Abnormal flow void within the proximal basilar artery-recommend CTA or MRA for further evaluation CT angiography completed-head and neck-no emergent LVO.  Poor enhancement of both vertebral arteries along their entire course favored to be chronic.  Appearance is similar to the MRA head from 11/13/2017, allowing for differences in modality.  Atherosclerotic calcification of the internal carotid arteries at the skull base with moderate bilateral cavernous segment stenosis.  Mild atherosclerotic calcification of carotid bifurcations without hemodynamically significant stenosis.  Basilar artery is diminutive but patent-there are bilateral P-comm's.  Assessment:  82 year old with significant cerebrovascular risk factors, dementia, CAD hypertension hyperlipidemia currently on anticoagulation and antiplatelet with questionable compliance to medication presenting for an episode of altered mental status, lethargy and weakness. Has a strong history of frequent UTIs. While examining her in the room, there was a foul smell of urine raising suspicion for a possible UTI currently as well Her MRI is negative for any acute stroke Her posterior circulation is extremely atherosclerotic and diminutive but that is unchanged from  before. Brainstem hypoperfusion could be considered as differentials but she is already optimized in terms of her medications from a posterior circulation atherosclerotic standpoint-on  an antiplatelet and anticoagulant as well as on statin.  With her baseline debility, she would not be a great candidate for any interventional modality of treatment unless there is a top of the basilar occlusion, which her CTA is not consistent with. At this point I do not think she needs any further stroke risk factor workup.  Current presentation may also be consistent with toxic metabolic encephalopathy from UTI in the setting of underlying poor brain reserve from dementia.  Impression: Toxic metabolic encephalopathy Hypothyroidism History of frequent UTIs  Recommendations: No need for stroke workup Continue Eliquis and aspirin Check chest x-ray, urinalysis If there is evidence of UTI, will need treatment for that. Management of hypothyroidism per primary team Check B12 levels-replete if less than 400. Given history of dementia, I would recommend getting an EEG to make sure there is no electrographic abnormality that could have caused her to be altered and then returned back to baseline. I would not start her on any AEDs for now Plan was relayed to the admitting hospitalist Dr. Shawnie Pons -- Yvonne Dikes, MD Neurologist [facility] Pager: [phone]   Addendum Urinalysis looks suggestive of UTI. Recommendations as above No new recommendations Neurology will be available as needed Please call with questions  -- Yvonne Dikes, MD Neurologist [facility] Pager: [phone]

## 2022-06-29 NOTE — ED Notes (Signed)
Son [name] [phone] would like an update asap

## 2022-06-29 NOTE — Progress Notes (Signed)
EEG complete - results pending 

## 2022-06-29 NOTE — Hospital Course (Signed)
Patient is a 82 year old with proximal A-fib, HTN, CKD, dementia, hypothyroidism, coronary artery disease, history of PE on Eliquis who was recently admitted to the hospital and discharged approximately 1 day ago with a similar story.  The patient has episodes of not responding up to about 1 hour where she is not talking and seems increasingly lethargic.  She was observed night and returned to her normal mentation.  At that time she was found to have undertreated hypothyroidism and her Synthroid was increased.  She had another episode today where she just stopped responding she would not talk or do anything.  Patient does have history of dementia, lives with her son and goes to a senior center daily and takes the bus to get there.  This seems so unlike her and so she was brought in for this.  Neurology recommended admission and stroke workup however after seeing her in the ED he recommended an EEG, B12 testing and optimizing her thyroid treatment as well as ruling out UTI.

## 2022-06-29 NOTE — ED Provider Notes (Signed)
EMERGENCY DEPARTMENT AT [facility] Provider Note   CSN: [id number] Arrival date & time: 06/29/22  1309     History  Chief Complaint  Patient presents with   Altered Mental Status    Yvonne Bailey is a 82 y.o. female.  82 year old female with a history of dementia, CAD status post PCI, CKD, hypertension, atrial fibrillation on Eliquis, and recurrent urinary tract infection who presents emergency department with altered mental status and decreased responsiveness that spontaneously improved.  Patient unsure of why she is here.  Did discuss with her son, Mr Yvonne Bailey, she had an episode for 1 hour where she was lethargic appearing and not talking.  He is concerned about a UTI because of confusion and fouls smelling urine.  Has been confused and weak starting on last Monday and went to the hospital and was found to have abnormal thyroid studies.  Was discharged on 06/27/2022.  Had a stroke previously. Today when she woke up she couldn't move or talk. Knees were drawn up and she was balled in a knot. Typically walks and goes to a senior center. Does have some incontinence. Lives at home with son. Walks with walker. Friday was normal and she went to the senior center.        Home Medications Prior to Admission medications   Medication Sig Start Date End Date Taking? Authorizing Provider  acidophilus (RISAQUAD) CAPS capsule Take 2 capsules by mouth 3 (three) times daily. 04/30/22   Arnetha Courser, MD  aspirin EC 81 MG tablet Take 81 mg by mouth daily. Swallow whole.    [provider]  atorvastatin (LIPITOR) 40 MG tablet Take 2 tablets (80 mg total) by mouth daily. 01/16/22   Pincus Sanes, MD  dorzolamide-timolol (COSOPT) 2-0.5 % ophthalmic solution Place 1 drop into both eyes in the morning, at noon, and at bedtime.    [provider]  ELIQUIS 5 MG TABS tablet TAKE 1 TABLET(5 MG) BY MOUTH TWICE DAILY 05/21/22   Pincus Sanes, MD  levothyroxine  (SYNTHROID) 112 MCG tablet Take 1 tablet (112 mcg total) by mouth daily. 06/28/22   Rolly Salter, MD  MYRBETRIQ 25 MG TB24 tablet TAKE 1 TABLET(25 MG) BY MOUTH DAILY 04/30/22   Pincus Sanes, MD  Netarsudil Dimesylate (RHOPRESSA) 0.02 % SOLN Place 1 drop into both eyes every evening.    [provider]  nitroGLYCERIN (NITROSTAT) 0.4 MG SL tablet Place 1 tablet (0.4 mg total) under the tongue every 5 (five) minutes as needed for chest pain. 09/08/15   Little Ishikawa, NP  polyethylene glycol (MIRALAX) 17 g packet Take one dose 3 times a day until bowel clear, maximum of 3 consecutive days. Patient taking differently: Take 17 g by mouth daily as needed for mild constipation. 10/30/20   Elpidio Anis, PA-C  sertraline (ZOLOFT) 100 MG tablet Take 1 tablet (100 mg total) by mouth daily. 05/24/21   Pincus Sanes, MD  spironolactone (ALDACTONE) 25 MG tablet TAKE 1 TABLET(25 MG) BY MOUTH DAILY Patient taking differently: Take 25 mg by mouth daily. 09/26/21   Burns, Bobette Mo, MD  Travoprost, BAK Free, (TRAVATAN Z) 0.004 % SOLN ophthalmic solution Place 1 drop into both eyes at bedtime.    [provider]      Allergies    Definity [perflutren lipid microsphere] and Pletal [cilostazol]    Review of Systems   Review of Systems  Physical Exam Updated Vital Signs BP 134/72  Pulse 63   Temp (!) 97.5 F (36.4 C) (Oral)   Resp 17   Ht 5\' 7"  (1.702 m)   Wt 73.5 kg   SpO2 100%   BMI 25.37 kg/m  Physical Exam Vitals and nursing note reviewed.  Constitutional:      General: She is not in acute distress.    Appearance: She is well-developed.     Comments: Conversant.  Alert and oriented to self and place but did not know the year.  HENT:     Head: Normocephalic and atraumatic.     Right Ear: External ear normal.     Left Ear: External ear normal.     Nose: Nose normal.  Eyes:     Extraocular Movements: Extraocular movements intact.     Conjunctiva/sclera: Conjunctivae normal.      Pupils: Pupils are equal, round, and reactive to light.  Cardiovascular:     Rate and Rhythm: Normal rate. Rhythm irregular.  Pulmonary:     Effort: Pulmonary effort is normal. No respiratory distress.     Breath sounds: Normal breath sounds.  Abdominal:     General: Abdomen is flat. There is no distension.     Palpations: Abdomen is soft. There is no mass.     Tenderness: There is no abdominal tenderness. There is no guarding.  Musculoskeletal:     Cervical back: Normal range of motion and neck supple.     Right lower leg: No edema.     Left lower leg: No edema.  Skin:    General: Skin is warm and dry.  Neurological:     Mental Status: She is alert. Mental status is at baseline.     Comments: Appears to be at her baseline.  Cranial nerves II through XII intact.  Full strength of upper and lower extremities with intact sensation to light touch in upper and lower extremities  Psychiatric:        Mood and Affect: Mood normal.     ED Results / Procedures / Treatments   Labs (all labs ordered are listed, but only abnormal results are displayed) Labs Reviewed  COMPREHENSIVE METABOLIC PANEL - Abnormal; Notable for the following components:      Result Value   Creatinine, Ser 1.15 (*)    Calcium 8.7 (*)    Albumin 3.3 (*)    GFR, Estimated 48 (*)    All other components within normal limits  URINALYSIS, ROUTINE W REFLEX MICROSCOPIC - Abnormal; Notable for the following components:   APPearance CLOUDY (*)    Specific Gravity, Urine 1.041 (*)    Hgb urine dipstick MODERATE (*)    Protein, ur 30 (*)    Leukocytes,Ua LARGE (*)    Bacteria, UA FEW (*)    All other components within normal limits  TSH - Abnormal; Notable for the following components:   TSH 8.428 (*)    All other components within normal limits  RAPID URINE DRUG SCREEN, HOSP PERFORMED  ETHANOL  CBC WITH DIFFERENTIAL/PLATELET  T4, FREE  BLOOD GAS, VENOUS  VITAMIN B12  CBG MONITORING, ED    EKG EKG  Interpretation  Date/Time:  Saturday June 29 2022 19:38:57 EDT Ventricular Rate:  75 PR Interval:    QRS Duration: 107 QT Interval:  431 QTC Calculation: 482 R Axis:   4 Text Interpretation: Atrial fibrillation Anterior infarct, old Confirmed by Vonita Moss [phone]) on 06/29/2022 9:23:55 PM  Radiology CT ANGIO HEAD NECK W WO CM  Result Date: 06/29/2022 CLINICAL  DATA:  Stroke workup EXAM: CT ANGIOGRAPHY HEAD AND NECK WITH AND WITHOUT CONTRAST TECHNIQUE: Multidetector CT imaging of the head and neck was performed using the standard protocol during bolus administration of intravenous contrast. Multiplanar CT image reconstructions and MIPs were obtained to evaluate the vascular anatomy. Carotid stenosis measurements (when applicable) are obtained utilizing NASCET criteria, using the distal internal carotid diameter as the denominator. RADIATION DOSE REDUCTION: This exam was performed according to the departmental dose-optimization program which includes automated exposure control, adjustment of the mA and/or kV according to patient size and/or use of iterative reconstruction technique. CONTRAST:  75mL OMNIPAQUE IOHEXOL 350 MG/ML SOLN COMPARISON:  MRA head 11/13/2017 FINDINGS: CT HEAD FINDINGS Brain: There is no mass, hemorrhage or extra-axial collection. The size and configuration of the ventricles and extra-axial CSF spaces are normal. There is no acute or chronic infarction. The brain parenchyma is normal. Skull: The visualized skull base, calvarium and extracranial soft tissues are normal. Sinuses/Orbits: No fluid levels or advanced mucosal thickening of the visualized paranasal sinuses. No mastoid or middle ear effusion. The orbits are normal. CTA NECK FINDINGS SKELETON: There is no bony spinal canal stenosis. No lytic or blastic lesion. OTHER NECK: Normal pharynx, larynx and major salivary glands. No cervical lymphadenopathy. Unremarkable thyroid gland. UPPER CHEST: No pneumothorax or pleural  effusion. No nodules or masses. AORTIC ARCH: Outside the field of view RIGHT CAROTID SYSTEM: No dissection, occlusion or aneurysm. Mild atherosclerotic calcification at the carotid bifurcation without hemodynamically significant stenosis. LEFT CAROTID SYSTEM: No dissection, occlusion or aneurysm. Mild atherosclerotic calcification at the carotid bifurcation without hemodynamically significant stenosis. VERTEBRAL ARTERIES: Poor enhancement of both vertebral arteries along their entire course CTA HEAD FINDINGS POSTERIOR CIRCULATION: --Vertebral arteries: Poor enhancement of both vertebral arteries with a diminutive basilar artery. --Inferior cerebellar arteries: Normal. --Basilar artery: Diminutive but patent. There are bilateral posterior communicating arteries. --Superior cerebellar arteries: Normal. --Posterior cerebral arteries (PCA): Normal. ANTERIOR CIRCULATION: --Intracranial internal carotid arteries: Atherosclerotic calcification of the internal carotid arteries at the skull base with moderate bilateral cavernous segment stenosis. --Anterior cerebral arteries (ACA): Normal. Both A1 segments are present. Patent anterior communicating artery (a-comm). --Middle cerebral arteries (MCA): Normal. VENOUS SINUSES: As permitted by contrast timing, patent. ANATOMIC VARIANTS: None Review of the MIP images confirms the above findings. IMPRESSION: 1. No emergent large vessel occlusion. 2. Poor enhancement of both vertebral arteries along their entire course, favored to be chronic. Appearance is similar to the MRA head of 11/13/2017, allowing for differences in modality. 3. Atherosclerotic calcification of the internal carotid arteries at the skull base with moderate bilateral cavernous segment stenosis. 4. Mild atherosclerotic calcification at the carotid bifurcations without hemodynamically significant stenosis. Electronically Signed   By: Deatra Robinson M.D.   On: 06/29/2022 19:12   CT HEAD WO CONTRAST  Result Date:  06/29/2022 CLINICAL DATA:  Altered mental status. EXAM: CT HEAD WITHOUT CONTRAST TECHNIQUE: Contiguous axial images were obtained from the base of the skull through the vertex without intravenous contrast. RADIATION DOSE REDUCTION: This exam was performed according to the departmental dose-optimization program which includes automated exposure control, adjustment of the mA and/or kV according to patient size and/or use of iterative reconstruction technique. COMPARISON:  06/26/2022 FINDINGS: Brain: No evidence of intracranial hemorrhage, acute infarction, hydrocephalus, extra-axial collection, or mass lesion/mass effect. Mild diffuse cerebral atrophy and severe chronic small vessel disease unchanged in appearance. Old infarcts are again seen involving the left posterior parietal lobe and right basal ganglia. Vascular:  No hyperdense vessel or other  acute findings. Skull: No evidence of fracture or other significant bone abnormality. Sinuses/Orbits:  No acute findings. Other: None. IMPRESSION: No acute intracranial abnormality. Stable cerebral atrophy, chronic small vessel disease, and old infarcts. Electronically Signed   By: Danae Orleans M.D.   On: 06/29/2022 14:55   DG Chest 2 View  Result Date: 06/29/2022 CLINICAL DATA:  Altered mental status EXAM: CHEST - 2 VIEW COMPARISON:  06/26/2022 FINDINGS: Mild cardiomegaly, stable. Coronary artery stents. No focal airspace consolidation, pleural effusion, or pneumothorax. IMPRESSION: No active cardiopulmonary disease. Electronically Signed   By: Duanne Guess D.O.   On: 06/29/2022 14:41    Procedures Procedures   Medications Ordered in ED Medications  aspirin EC tablet 81 mg (has no administration in time range)  amoxicillin (AMOXIL) capsule 500 mg (has no administration in time range)  atorvastatin (LIPITOR) tablet 80 mg (has no administration in time range)  nitroGLYCERIN (NITROSTAT) SL tablet 0.4 mg (has no administration in time range)  spironolactone  (ALDACTONE) tablet 25 mg (has no administration in time range)  sertraline (ZOLOFT) tablet 100 mg (has no administration in time range)  levothyroxine (SYNTHROID) tablet 112 mcg (has no administration in time range)  acidophilus (RISAQUAD) capsule 2 capsule (has no administration in time range)  polyethylene glycol (MIRALAX / GLYCOLAX) packet 17 g (has no administration in time range)  mirabegron ER (MYRBETRIQ) tablet 25 mg (has no administration in time range)  apixaban (ELIQUIS) tablet 5 mg (has no administration in time range)  dorzolamide-timolol (COSOPT) 2-0.5 % ophthalmic solution 1 drop (has no administration in time range)  Netarsudil Dimesylate 0.02 % SOLN 1 drop (has no administration in time range)  latanoprost (XALATAN) 0.005 % ophthalmic solution 1 drop (has no administration in time range)  sodium chloride flush (NS) 0.9 % injection 3 mL (has no administration in time range)  sodium chloride flush (NS) 0.9 % injection 3 mL (has no administration in time range)  0.9 %  sodium chloride infusion (has no administration in time range)  acetaminophen (TYLENOL) tablet 650 mg (has no administration in time range)    Or  acetaminophen (TYLENOL) suppository 650 mg (has no administration in time range)  fentaNYL (SUBLIMAZE) injection 12.5-50 mcg (has no administration in time range)  ondansetron (ZOFRAN) tablet 4 mg (has no administration in time range)    Or  ondansetron (ZOFRAN) injection 4 mg (has no administration in time range)  metoprolol tartrate (LOPRESSOR) injection 5 mg (has no administration in time range)  iohexol (OMNIPAQUE) 350 MG/ML injection 75 mL (75 mLs Intravenous Contrast Given 06/29/22 1903)    ED Course/ Medical Decision Making/ A&P Clinical Course as of 06/29/22 2130  Sat Jun 29, 2022  1837 Dr Otelia Limes from neurology consulted regarding possible TIA.  Feels that she may warrant TIA evaluation at this time given her symptoms. [RP]  1900 Dr Shawnie Pons from hospitalist  consulted regarding admission for altered mental status. [RP]    Clinical Course User Index [RP] Rondel Baton, MD                             Medical Decision Making Amount and/or Complexity of Data Reviewed Labs: ordered. Radiology: ordered.  Risk Prescription drug management. Decision regarding hospitalization.   ROSSANA MOLCHAN is a 82 y.o. female with comorbidities that complicate the patient evaluation including dementia, CAD status post PCI, CKD, hypertension, atrial fibrillation on Eliquis, and recurrent urinary tract infection who presents emergency department  with altered mental status and decreased responsiveness that spontaneously improved.    Initial Ddx:  TIA, delirium, UTI, pneumonia, medication side effect, hypothyroidism  MDM:  Unclear exactly what is causing the patient's symptoms.  Did have very rapid improvement which is concerning for possible TIA.  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  No apparent medications on her discharge list that would be causing her symptoms.  Will check for urinary tract infection though she only grew 60,000 CFU's from her prior urine culture.  Plan:  Labs TSH Urinalysis Urine drug screen CT head Chest x-ray  ED Summary/Re-evaluation:  Patient underwent the above workup with urinalysis pending.  TSH improved from prior.  CT head unremarkable.  Was discussed with neurology who feels that TIA is possible so we will obtain vessel imaging and admit to medicine at this time.  Urinalysis did return and shows possible urinary tract infection as well.  This patient presents to the ED for concern of complaints listed in HPI, this involves an extensive number of treatment options, and is a complaint that carries with it a high risk of complications and morbidity. Disposition including potential need for admission considered.    Dispo: Admit to Floor  Additional history obtained from son Records reviewed DC Summary The following labs were independently interpreted: Chemistry and show CKD I independently reviewed the following imaging with scope of interpretation limited to determining acute life threatening conditions related to emergency care: CT Head and agree with the radiologist interpretation with the following exceptions: None I personally reviewed and interpreted cardiac monitoring: atrial fibrillation (normal rate) I personally reviewed and interpreted the pt's EKG: see above for interpretation  I have reviewed the patients home medications and made adjustments as needed Consults: Hospitalist and Neurology Social Determinants of health:  Elderly  Final Clinical Impression(s) / ED Diagnoses Final diagnoses:  Altered mental status, unspecified altered mental status type    Rx / DC Orders ED Discharge Orders     None         Rondel Baton, MD 06/29/22 2130

## 2022-06-30 ENCOUNTER — Encounter (HOSPITAL_COMMUNITY): Payer: Self-pay | Admitting: Family Medicine

## 2022-06-30 DIAGNOSIS — R4182 Altered mental status, unspecified: Secondary | ICD-10-CM

## 2022-06-30 DIAGNOSIS — G459 Transient cerebral ischemic attack, unspecified: Secondary | ICD-10-CM

## 2022-06-30 LAB — BLOOD GAS, VENOUS
Acid-Base Excess: 0.4 mmol/L (ref 0.0–2.0)
Bicarbonate: 22.9 mmol/L (ref 20.0–28.0)
O2 Saturation: 96.3 %
Patient temperature: 37
pCO2, Ven: 30 mmHg — ABNORMAL LOW (ref 44–60)
pH, Ven: 7.49 — ABNORMAL HIGH (ref 7.25–7.43)
pO2, Ven: 65 mmHg — ABNORMAL HIGH (ref 32–45)

## 2022-06-30 LAB — VITAMIN B12: Vitamin B-12: 464 pg/mL (ref 180–914)

## 2022-06-30 MED ORDER — THIAMINE HCL 100 MG/ML IJ SOLN
500.0000 mg | Freq: Once | INTRAVENOUS | Status: AC
  Administered 2022-06-30: 500 mg via INTRAVENOUS
  Filled 2022-06-30: qty 5

## 2022-06-30 MED ORDER — AMOXICILLIN 500 MG PO CAPS
500.0000 mg | ORAL_CAPSULE | Freq: Three times a day (TID) | ORAL | Status: DC
  Administered 2022-06-30 – 2022-07-01 (×3): 500 mg via ORAL
  Filled 2022-06-30 (×3): qty 1

## 2022-06-30 MED ORDER — CYANOCOBALAMIN 1000 MCG/ML IJ SOLN
1000.0000 ug | Freq: Once | INTRAMUSCULAR | Status: AC
  Administered 2022-06-30: 1000 ug via INTRAMUSCULAR
  Filled 2022-06-30: qty 1

## 2022-06-30 NOTE — ED Notes (Signed)
ED TO INPATIENT HANDOFF REPORT  ED Nurse Name and Phone #: Rhae Lerner [phone]  S Name/Age/Gender Yvonne Bailey 82 y.o. female Room/Bed: 038C/038C  Code Status   Code Status: Full Code  Home/SNF/Other Home Patient oriented to: self and place Is this baseline?  Pt has dementia at baseline. Currently oriented to self and place.  Triage Complete: Triage complete  Chief Complaint TIA (transient ischemic attack) [G45.9]  Triage Note Per [facility] pt coming from home. Son called ems stating patient is normally verbal and has only said one word today. Patient has dementia at baseline.    Allergies Allergies  Allergen Reactions   Definity [Perflutren Lipid Microsphere] Other (See Comments)    Patient experienced back pain with injection   Pletal [Cilostazol] Other (See Comments)    Made pt "feel funny"    Level of Care/Admitting Diagnosis ED Disposition     ED Disposition  Admit   Condition  --   Comment  Hospital Area: [facility] [[id number]]  Level of Care: Telemetry Medical [104]  May place patient in observation at [facility] or [facility] if equivalent level of care is available:: Yes  Covid Evaluation: Asymptomatic - no recent exposure (last 10 days) testing not required  Diagnosis: TIA (transient ischemic attack) [454098]  Admitting Physician: Samara Snide  Attending Physician: Samara Snide          B Medical/Surgery History Past Medical History:  Diagnosis Date   Bilateral leg edema 07/19/2015   CAD (coronary artery disease)    a. anterior MI s/p PCI in 1992. b. cath 08/2015 with severe three-vessel CAD turned down for CABG and underwent DESx5 to prox Cx/OM2/D1/oRCA/mRCA   Carotid artery disease    a. carotid duplex 03/2015 showed 1-39% BICA, normal subclavian arteries, chronically occluded left vertebral, f/u recommended only PRN.   DVT (deep venous thrombosis)    X1   History of nuclear stress test    a. Myoview 6/17: EF  20-25%, mid anteroseptal, apical anterior, apical septal, apical inferior, apical lateral and apical scar, no ischemia, intermediate risk   HTN (hypertension)    Hyperlipidemia    Hypokalemia    Hypothyroidism    Ischemic cardiomyopathy    a. Echo 6/17: EF 20-25%, apex appears akinetic, MAC, moderate MR, moderate LAE, mild RVE, trivial PI, PASP 47 mmHg (needs repeat with Definity contrast).  b. Limited echo with Definity contrast 7/17: EF 25-30%, moderate to severe LAE. c. Limited Echo 2018 showed EF 40-45%.   Lacunar infarction 12/17/2012   Dr Terrace Arabia, Neurology    Leukocytosis    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   Longstanding persistent atrial fibrillation    MI (myocardial infarction) 1992   PAD (peripheral artery disease)    Right SFA occlusion, severe disease left CFA and SFA   Prediabetes 07/28/2016   Stage 3 chronic kidney disease    Stroke    a. 02/2017 in setting of noncompliance with Eliquis   TIA (transient ischemic attack)    Tricuspid regurgitation    Past Surgical History:  Procedure Laterality Date   APPENDECTOMY     at hysterectomy and USO for fibroids, Dr. Kizzie Bane   CARDIAC CATHETERIZATION  1992   Dr Charlies Constable   CARDIAC CATHETERIZATION N/A 09/06/2015   Procedure: Right/Left Heart Cath and Coronary Angiography;  Surgeon: Kathleene Hazel, MD;  Location: [facility] INVASIVE CV LAB;  Service: Cardiovascular;  Laterality: N/A;   CARDIAC CATHETERIZATION N/A 09/07/2015   Procedure: Coronary Stent  Intervention;  Surgeon: Kathleene Hazel, MD;  Location: [facility] INVASIVE CV LAB;  Service: Cardiovascular;  Laterality: N/A;   COLONOSCOPY     negative; 2008, Dr. Lina Sar   fracture LLE     '94; pinned   IR ANGIO INTRA EXTRACRAN SEL COM CAROTID INNOMINATE BILAT MOD SED  03/02/2017   IR ANGIO INTRA EXTRACRAN SEL COM CAROTID INNOMINATE BILAT MOD SED  04/23/2018   IR ANGIO VERTEBRAL SEL SUBCLAVIAN INNOMINATE BILAT MOD SED  04/23/2018   IR ANGIO VERTEBRAL SEL VERTEBRAL UNI R  MOD SED  03/02/2017   IR RADIOLOGIST EVAL & MGMT  04/28/2017   IR US GUIDE VASC ACCESS RIGHT  04/23/2018   RADIOLOGY WITH ANESTHESIA N/A 03/02/2017   Procedure: RADIOLOGY WITH ANESTHESIA;  Surgeon: Julieanne Cotton, MD;  Location: [facility] OR;  Service: Radiology;  Laterality: N/A;   TEE WITHOUT CARDIOVERSION N/A 11/02/2020   Procedure: TRANSESOPHAGEAL ECHOCARDIOGRAM (TEE);  Surgeon: Thurmon Fair, MD;  Location: [facility] ENDOSCOPY;  Service: Cardiovascular;  Laterality: N/A;   TONSILLECTOMY     TOTAL ABDOMINAL HYSTERECTOMY     & BSO for fibroids     A IV Location/Drains/Wounds Patient Lines/Drains/Airways Status     Active Line/Drains/Airways     Name Placement date Placement time Site Days   Peripheral IV 06/29/22 20 G Anterior;Left;Proximal Forearm 06/29/22  1639  Forearm  1            Intake/Output Last 24 hours No intake or output data in the 24 hours ending 06/30/22 1154  Labs/Imaging Results for orders placed or performed during the hospital encounter of 06/29/22 (from the past 48 hour(s))  Rapid urine drug screen (hospital performed)     Status: None   Collection Time: 06/29/22  1:51 PM  Result Value Ref Range   Opiates NONE DETECTED NONE DETECTED   Cocaine NONE DETECTED NONE DETECTED   Benzodiazepines NONE DETECTED NONE DETECTED   Amphetamines NONE DETECTED NONE DETECTED   Tetrahydrocannabinol NONE DETECTED NONE DETECTED   Barbiturates NONE DETECTED NONE DETECTED    Comment: (NOTE) DRUG SCREEN FOR MEDICAL PURPOSES ONLY.  IF CONFIRMATION IS NEEDED FOR ANY PURPOSE, NOTIFY LAB WITHIN 5 DAYS.  LOWEST DETECTABLE LIMITS FOR URINE DRUG SCREEN Drug Class                     Cutoff (ng/mL) Amphetamine and metabolites    1000 Barbiturate and metabolites    200 Benzodiazepine                 200 Opiates and metabolites        300 Cocaine and metabolites        300 THC                            50 Performed at [facility], [address] [place],  [place] [address]   Urinalysis, Routine w reflex microscopic -Urine, Catheterized     Status: Abnormal   Collection Time: 06/29/22  1:51 PM  Result Value Ref Range   Color, Urine YELLOW YELLOW   APPearance CLOUDY (A) CLEAR   Specific Gravity, Urine 1.041 (H) 1.005 - 1.030   pH 6.0 5.0 - 8.0   Glucose, UA NEGATIVE NEGATIVE mg/dL   Hgb urine dipstick MODERATE (A) NEGATIVE   Bilirubin Urine NEGATIVE NEGATIVE   Ketones, ur NEGATIVE NEGATIVE mg/dL   Protein, ur 30 (A) NEGATIVE mg/dL   Nitrite NEGATIVE NEGATIVE  Leukocytes,Ua LARGE (A) NEGATIVE   RBC / HPF 21-50 0 - 5 RBC/hpf   WBC, UA >50 0 - 5 WBC/hpf   Bacteria, UA FEW (A) NONE SEEN   Squamous Epithelial / HPF 0-5 0 - 5 /HPF   Mucus PRESENT     Comment: Performed at [facility], [address], [place], [place] [address]  CBG monitoring, ED     Status: None   Collection Time: 06/29/22  3:11 PM  Result Value Ref Range   Glucose-Capillary 80 70 - 99 mg/dL    Comment: Glucose reference range applies only to samples taken after fasting for at least 8 hours.  Comprehensive metabolic panel     Status: Abnormal   Collection Time: 06/29/22  4:37 PM  Result Value Ref Range   Sodium 139 135 - 145 mmol/L   Potassium 4.1 3.5 - 5.1 mmol/L   Chloride 106 98 - 111 mmol/L   CO2 25 22 - 32 mmol/L   Glucose, Bld 94 70 - 99 mg/dL    Comment: Glucose reference range applies only to samples taken after fasting for at least 8 hours.   BUN 18 8 - 23 mg/dL   Creatinine, Ser 9.14 (H) 0.44 - 1.00 mg/dL   Calcium 8.7 (L) 8.9 - 10.3 mg/dL   Total Protein 6.8 6.5 - 8.1 g/dL   Albumin 3.3 (L) 3.5 - 5.0 g/dL   AST 29 15 - 41 U/L   ALT 20 0 - 44 U/L   Alkaline Phosphatase 102 38 - 126 U/L   Total Bilirubin 0.8 0.3 - 1.2 mg/dL   GFR, Estimated 48 (L) >60 mL/min    Comment: (NOTE) Calculated using the CKD-EPI Creatinine Equation (2021)    Anion gap 8 5 - 15    Comment: Performed at [facility], [address], [place], [place] [address]  Ethanol      Status: None   Collection Time: 06/29/22  4:37 PM  Result Value Ref Range   Alcohol, Ethyl (B) <10 <10 mg/dL    Comment: (NOTE) Lowest detectable limit for serum alcohol is 10 mg/dL.  For medical purposes only. Performed at [facility], [address] [place], [place] [address]   CBC with Differential/Platelet     Status: None   Collection Time: 06/29/22  4:37 PM  Result Value Ref Range   WBC 9.4 4.0 - 10.5 K/uL   RBC 4.17 3.87 - 5.11 MIL/uL   Hemoglobin 13.3 12.0 - 15.0 g/dL   HCT 86.5 78.4 - 69.6 %   MCV 98.3 80.0 - 100.0 fL   MCH 31.9 26.0 - 34.0 pg   MCHC 32.4 30.0 - 36.0 g/dL   RDW 29.5 28.4 - 13.2 %   Platelets 245 150 - 400 K/uL   nRBC 0.0 0.0 - 0.2 %   Neutrophils Relative % 71 %   Neutro Abs 6.7 1.7 - 7.7 K/uL   Lymphocytes Relative 20 %   Lymphs Abs 1.9 0.7 - 4.0 K/uL   Monocytes Relative 6 %   Monocytes Absolute 0.6 0.1 - 1.0 K/uL   Eosinophils Relative 2 %   Eosinophils Absolute 0.2 0.0 - 0.5 K/uL   Basophils Relative 1 %   Basophils Absolute 0.1 0.0 - 0.1 K/uL   Immature Granulocytes 0 %   Abs Immature Granulocytes 0.02 0.00 - 0.07 K/uL    Comment: Performed at [facility], [address], [place], [place] [address]  TSH  Status: Abnormal   Collection Time: 06/29/22  4:37 PM  Result Value Ref Range   TSH 8.428 (H) 0.350 - 4.500 uIU/mL    Comment: Performed by a 3rd Generation assay with a functional sensitivity of <=0.01 uIU/mL. Performed at [facility], [address], [place], [place] [address]   T4, free     Status: None   Collection Time: 06/29/22  4:37 PM  Result Value Ref Range   Free T4 0.73 0.61 - 1.12 ng/dL    Comment: (NOTE) Biotin ingestion may interfere with free T4 tests. If the results are inconsistent with the TSH level, previous test results, or the clinical presentation, then consider biotin interference. If needed, order repeat testing after stopping biotin. Performed at [facility], [address], [place], [place] [address]   Blood gas, venous     Status: Abnormal   Collection Time: 06/30/22  3:32 AM  Result Value Ref Range   pH, Ven 7.49 (H) 7.25 - 7.43   pCO2, Ven 30 (L) 44 - 60 mmHg   pO2, Ven 65 (H) 32 - 45 mmHg   Bicarbonate 22.9 20.0 - 28.0 mmol/L   Acid-Base Excess 0.4 0.0 - 2.0 mmol/L   O2 Saturation 96.3 %   Patient temperature 37.0    Collection site VEIN     Comment: Performed at [facility], [address] [place], [place] [address]  Vitamin B12     Status: None   Collection Time: 06/30/22  3:32 AM  Result Value Ref Range   Vitamin B-12 464 180 - 914 pg/mL    Comment: (NOTE) This assay is not validated for testing neonatal or myeloproliferative syndrome specimens for Vitamin B12 levels. Performed at [facility], [address], [place], [place] [address]    EEG adult  Result Date: 06/29/2022 Rejeana Brock, MD     06/30/2022  8:37 AM History: 82 year old female being evaluated for altered mental status Sedation: None Technique: This EEG was acquired with electrodes placed according to the International 10-20 electrode system (including Fp1, Fp2, F3, F4, C3, C4, P3, P4, O1, O2, T3, T4, T5, T6, A1, A2, Fz, Cz, Pz). The following electrodes were missing or displaced: none. Background: The background consists of intermixed alpha and beta activities. There is a well defined posterior dominant rhythm of 8-9 hz that attenuates with eye opening.  There is anterior shifting of the posterior dominant rhythm associated with drowsiness, but sleep is not recorded. Photic stimulation: Physiologic driving is present EEG Abnormalities: None Clinical Interpretation: This normal EEG is recorded in the waking and drowsy state. There was no seizure or seizure predisposition recorded on this study. Please note that lack of epileptiform activity on EEG does not preclude the possibility of epilepsy. Ritta Slot, MD [facility] [phone] If 7pm-  7am, please page neurology on call as listed in AMION.   CT ANGIO HEAD NECK W WO CM  Result Date: 06/29/2022 CLINICAL DATA:  Stroke workup EXAM: CT ANGIOGRAPHY HEAD AND NECK WITH AND WITHOUT CONTRAST TECHNIQUE: Multidetector CT imaging of the head and neck was performed using the standard protocol during bolus administration of intravenous contrast. Multiplanar CT image reconstructions and MIPs were obtained to evaluate the vascular anatomy. Carotid stenosis measurements (when applicable) are obtained utilizing NASCET criteria, using the distal internal carotid diameter as the denominator. RADIATION DOSE REDUCTION: This exam was performed according to the departmental dose-optimization program which includes automated exposure control, adjustment of the  mA and/or kV according to patient size and/or use of iterative reconstruction technique. CONTRAST:  75mL OMNIPAQUE IOHEXOL 350 MG/ML SOLN COMPARISON:  MRA head 11/13/2017 FINDINGS: CT HEAD FINDINGS Brain: There is no mass, hemorrhage or extra-axial collection. The size and configuration of the ventricles and extra-axial CSF spaces are normal. There is no acute or chronic infarction. The brain parenchyma is normal. Skull: The visualized skull base, calvarium and extracranial soft tissues are normal. Sinuses/Orbits: No fluid levels or advanced mucosal thickening of the visualized paranasal sinuses. No mastoid or middle ear effusion. The orbits are normal. CTA NECK FINDINGS SKELETON: There is no bony spinal canal stenosis. No lytic or blastic lesion. OTHER NECK: Normal pharynx, larynx and major salivary glands. No cervical lymphadenopathy. Unremarkable thyroid gland. UPPER CHEST: No pneumothorax or pleural effusion. No nodules or masses. AORTIC ARCH: Outside the field of view RIGHT CAROTID SYSTEM: No dissection, occlusion or aneurysm. Mild atherosclerotic calcification at the carotid bifurcation without hemodynamically significant stenosis. LEFT CAROTID SYSTEM: No  dissection, occlusion or aneurysm. Mild atherosclerotic calcification at the carotid bifurcation without hemodynamically significant stenosis. VERTEBRAL ARTERIES: Poor enhancement of both vertebral arteries along their entire course CTA HEAD FINDINGS POSTERIOR CIRCULATION: --Vertebral arteries: Poor enhancement of both vertebral arteries with a diminutive basilar artery. --Inferior cerebellar arteries: Normal. --Basilar artery: Diminutive but patent. There are bilateral posterior communicating arteries. --Superior cerebellar arteries: Normal. --Posterior cerebral arteries (PCA): Normal. ANTERIOR CIRCULATION: --Intracranial internal carotid arteries: Atherosclerotic calcification of the internal carotid arteries at the skull base with moderate bilateral cavernous segment stenosis. --Anterior cerebral arteries (ACA): Normal. Both A1 segments are present. Patent anterior communicating artery (a-comm). --Middle cerebral arteries (MCA): Normal. VENOUS SINUSES: As permitted by contrast timing, patent. ANATOMIC VARIANTS: None Review of the MIP images confirms the above findings. IMPRESSION: 1. No emergent large vessel occlusion. 2. Poor enhancement of both vertebral arteries along their entire course, favored to be chronic. Appearance is similar to the MRA head of 11/13/2017, allowing for differences in modality. 3. Atherosclerotic calcification of the internal carotid arteries at the skull base with moderate bilateral cavernous segment stenosis. 4. Mild atherosclerotic calcification at the carotid bifurcations without hemodynamically significant stenosis. Electronically Signed   By: Deatra Robinson M.D.   On: 06/29/2022 19:12   CT HEAD WO CONTRAST  Result Date: 06/29/2022 CLINICAL DATA:  Altered mental status. EXAM: CT HEAD WITHOUT CONTRAST TECHNIQUE: Contiguous axial images were obtained from the base of the skull through the vertex without intravenous contrast. RADIATION DOSE REDUCTION: This exam was performed  according to the departmental dose-optimization program which includes automated exposure control, adjustment of the mA and/or kV according to patient size and/or use of iterative reconstruction technique. COMPARISON:  06/26/2022 FINDINGS: Brain: No evidence of intracranial hemorrhage, acute infarction, hydrocephalus, extra-axial collection, or mass lesion/mass effect. Mild diffuse cerebral atrophy and severe chronic small vessel disease unchanged in appearance. Old infarcts are again seen involving the left posterior parietal lobe and right basal ganglia. Vascular:  No hyperdense vessel or other acute findings. Skull: No evidence of fracture or other significant bone abnormality. Sinuses/Orbits:  No acute findings. Other: None. IMPRESSION: No acute intracranial abnormality. Stable cerebral atrophy, chronic small vessel disease, and old infarcts. Electronically Signed   By: Danae Orleans M.D.   On: 06/29/2022 14:55   DG Chest 2 View  Result Date: 06/29/2022 CLINICAL DATA:  Altered mental status EXAM: CHEST - 2 VIEW COMPARISON:  06/26/2022 FINDINGS: Mild cardiomegaly, stable. Coronary artery stents. No focal airspace consolidation, pleural effusion, or pneumothorax. IMPRESSION:  No active cardiopulmonary disease. Electronically Signed   By: Duanne Guess D.O.   On: 06/29/2022 14:41    Pending Labs Unresulted Labs (From admission, onward)    None       Vitals/Pain Today's Vitals   06/30/22 0615 06/30/22 0638 06/30/22 0906 06/30/22 1100  BP:   (!) 141/69 138/79  Pulse: 64  68 60  Resp: 19  15 15   Temp:  98.1 F (36.7 C) (!) 97.5 F (36.4 C)   TempSrc:   Oral   SpO2: 100%  100% 100%  Weight:      Height:        Isolation Precautions No active isolations  Medications Medications  aspirin EC tablet 81 mg (81 mg Oral Given 06/30/22 0921)  atorvastatin (LIPITOR) tablet 80 mg (80 mg Oral Given 06/30/22 0921)  nitroGLYCERIN (NITROSTAT) SL tablet 0.4 mg (has no administration in time range)   spironolactone (ALDACTONE) tablet 25 mg (25 mg Oral Given 06/30/22 0921)  sertraline (ZOLOFT) tablet 100 mg (100 mg Oral Given 06/30/22 0921)  levothyroxine (SYNTHROID) tablet 112 mcg (112 mcg Oral Given 06/30/22 0600)  acidophilus (RISAQUAD) capsule 2 capsule (2 capsules Oral Not Given 06/30/22 0921)  polyethylene glycol (MIRALAX / GLYCOLAX) packet 17 g (has no administration in time range)  mirabegron ER (MYRBETRIQ) tablet 25 mg (25 mg Oral Not Given 06/30/22 1100)  apixaban (ELIQUIS) tablet 5 mg (5 mg Oral Given 06/30/22 0921)  dorzolamide-timolol (COSOPT) 2-0.5 % ophthalmic solution 1 drop (1 drop Both Eyes Not Given 06/30/22 0922)  Netarsudil Dimesylate 0.02 % SOLN 1 drop (1 drop Both Eyes Not Given 06/30/22 0820)  latanoprost (XALATAN) 0.005 % ophthalmic solution 1 drop (1 drop Both Eyes Not Given 06/30/22 0820)  sodium chloride flush (NS) 0.9 % injection 3 mL (3 mLs Intravenous Given 06/30/22 0922)  sodium chloride flush (NS) 0.9 % injection 3 mL (3 mLs Intravenous Given 06/29/22 2153)  0.9 %  sodium chloride infusion (has no administration in time range)  acetaminophen (TYLENOL) tablet 650 mg (has no administration in time range)    Or  acetaminophen (TYLENOL) suppository 650 mg (has no administration in time range)  fentaNYL (SUBLIMAZE) injection 12.5-50 mcg (has no administration in time range)  ondansetron (ZOFRAN) tablet 4 mg (has no administration in time range)    Or  ondansetron (ZOFRAN) injection 4 mg (has no administration in time range)  metoprolol tartrate (LOPRESSOR) injection 5 mg (has no administration in time range)  amoxicillin (AMOXIL) capsule 500 mg (has no administration in time range)  iohexol (OMNIPAQUE) 350 MG/ML injection 75 mL (75 mLs Intravenous Contrast Given 06/29/22 1903)    Mobility Pt currently bed bound due to weakness.      Focused Assessments Pt seen in ED for concerns of frequent UTIs and altered mental status. Pt currently awake and alert with c/o  feeling like she has to urinate often. Pt disoriented to time and situation.    R Recommendations: See Admitting Provider Note  Report given to:   Additional Notes:

## 2022-06-30 NOTE — Procedures (Signed)
History: 82 year old female being evaluated for altered mental status  Sedation: None  Technique: This EEG was acquired with electrodes placed according to the International 10-20 electrode system (including Fp1, Fp2, F3, F4, C3, C4, P3, P4, O1, O2, T3, T4, T5, T6, A1, A2, Fz, Cz, Pz). The following electrodes were missing or displaced: none.   Background: The background consists of intermixed alpha and beta activities. There is a well defined posterior dominant rhythm of 8-9 hz that attenuates with eye opening.  There is anterior shifting of the posterior dominant rhythm associated with drowsiness, but sleep is not recorded.  Photic stimulation: Physiologic driving is present  EEG Abnormalities: None  Clinical Interpretation: This normal EEG is recorded in the waking and drowsy state. There was no seizure or seizure predisposition recorded on this study. Please note that lack of epileptiform activity on EEG does not preclude the possibility of epilepsy.   Ritta Slot, MD [facility] [phone]  If 7pm- 7am, please page neurology on call as listed in AMION.

## 2022-06-30 NOTE — Plan of Care (Signed)
  Problem: Education: Goal: Knowledge of disease or condition will improve Outcome: Progressing   Problem: Education: Goal: Knowledge of secondary prevention will improve (MUST DOCUMENT ALL) Outcome: Progressing

## 2022-06-30 NOTE — ED Notes (Signed)
Pt currently awake and oriented to self and place. Pt has no complaints or concerns at this time. Pt resting in bed with call light within reach.

## 2022-06-30 NOTE — Progress Notes (Signed)
[facility] Progress Note Patient: Yvonne Bailey UJW:[id number] DOB: [birth year] DOA: 06/29/2022  DOS: the patient was seen and examined on 06/30/2022  Brief hospital course: Patient is a 82 year old with proximal A-fib, HTN, CKD, dementia, hypothyroidism, coronary artery disease, history of PE on Eliquis who was recently admitted to the hospital and discharged approximately 1 day ago with a similar story.  The patient has episodes of not responding up to about 1 hour where she is not talking and seems increasingly lethargic.  She was observed night and returned to her normal mentation.  At that time she was found to have undertreated hypothyroidism and her Synthroid was increased.  She had another episode today where she just stopped responding she would not talk or do anything.  Patient does have history of dementia, lives with her son and goes to a senior center daily and takes the bus to get there.  This seems so unlike her and so she was brought in for this.  Neurology recommended admission and stroke workup however after seeing her in the ED he recommended an EEG, B12 testing and optimizing her thyroid treatment as well as ruling out UTI. Assessment and Plan: Acute metabolic encephalopathy  E faecalis UTI. Recently hospitalized for similar presentation. Thought to be secondary to hypothyroidism. Patient's TSH was elevated and free T4 was normal. Synthroid dose was adjusted and patient was recommended to follow-up with PCP for repeat level use. Patient was taking Synthroid after breakfast which might be contributing to poor efficacy. MRI brain negative for acute stroke. Neurology was consulted at this time as the patient presented with similar presentation. Currently able to communicate normally, able to follow all the commands, able to tell me where she is in the hospital without any focal deficit. Will treat with oral amoxicillin and monitor response. Repeat PT OT evaluation.    Paroxysmal A-fib. Currently rate controlled.  Continue Eliquis.   HTN. Continue home regimen.   CKD stage II. Serum creatinine stable. Monitor.   Dementia. No agitation or behavioral issues. Monitor.  Relative B12 deficiency. B12 464. Patient will be given subcutaneous B12 as well as IV thiamine.   Subjective: No nausea no vomiting no fever no chills.  No chest pain abdomen pain.  In pleasant mood.  No acute complaint.  Wants to go home and she thinks that she is ready to go home.  Physical Exam: General: in Mild distress, No Rash Cardiovascular: S1 and S2 Present, No Murmur Respiratory: Good respiratory effort, Bilateral Air entry present. No Crackles, No wheezes Abdomen: Bowel Sound present, No tenderness Extremities: No edema Neuro: Alert and oriented x3, no new focal deficit  Data Reviewed: I have Reviewed nursing notes, Vitals, and Lab results. Since last encounter, pertinent lab results CBC and BMP   . I have ordered test including CBC and BMP  . I have discussed pt's care plan and test results with neurology  .   Disposition: Status is: Observation  apixaban (ELIQUIS) tablet 5 mg   Family Communication: No one at bedside Level of care: Telemetry Medical   Vitals:   06/30/22 1330 06/30/22 1416 06/30/22 1435 06/30/22 1524  BP: 132/82 (!) 140/76 (!) 149/88 129/74  Pulse: 74 68 73 77  Resp: 14 (!) Temp:   (!) 97.5 F (36.4 C) 97.9 F (36.6 C)  TempSrc:   Axillary Oral  SpO2: 100% 100% 99% 94%  Weight:      Height:  Author: Lynden Oxford, MD 06/30/2022 4:58 PM  Please look on [email or web address] to find out who is on call.

## 2022-06-30 NOTE — ED Notes (Signed)
Assisted pt with eating lunch 13:04

## 2022-07-01 ENCOUNTER — Observation Stay (HOSPITAL_COMMUNITY): Payer: Medicare HMO

## 2022-07-01 DIAGNOSIS — R4182 Altered mental status, unspecified: Secondary | ICD-10-CM | POA: Diagnosis not present

## 2022-07-01 DIAGNOSIS — G459 Transient cerebral ischemic attack, unspecified: Secondary | ICD-10-CM | POA: Diagnosis not present

## 2022-07-01 LAB — COMPREHENSIVE METABOLIC PANEL
ALT: 19 U/L (ref 0–44)
AST: 27 U/L (ref 15–41)
Albumin: 3.2 g/dL — ABNORMAL LOW (ref 3.5–5.0)
Alkaline Phosphatase: 94 U/L (ref 38–126)
Anion gap: 6 (ref 5–15)
BUN: 23 mg/dL (ref 8–23)
CO2: 27 mmol/L (ref 22–32)
Calcium: 8.9 mg/dL (ref 8.9–10.3)
Chloride: 107 mmol/L (ref 98–111)
Creatinine, Ser: 1.23 mg/dL — ABNORMAL HIGH (ref 0.44–1.00)
GFR, Estimated: 44 mL/min — ABNORMAL LOW (ref >60)
Glucose, Bld: 91 mg/dL (ref 70–99)
Potassium: 4.1 mmol/L (ref 3.5–5.1)
Sodium: 140 mmol/L (ref 135–145)
Total Bilirubin: 0.7 mg/dL (ref 0.3–1.2)
Total Protein: 6.7 g/dL (ref 6.5–8.1)

## 2022-07-01 LAB — CBC WITH DIFFERENTIAL/PLATELET
Abs Immature Granulocytes: 0.02 10*3/uL (ref 0.00–0.07)
Basophils Absolute: 0.1 10*3/uL (ref 0.0–0.1)
Basophils Relative: 1 %
Eosinophils Absolute: 0.3 10*3/uL (ref 0.0–0.5)
Eosinophils Relative: 4 %
HCT: 40 % (ref 36.0–46.0)
Hemoglobin: 13.1 g/dL (ref 12.0–15.0)
Immature Granulocytes: 0 %
Lymphocytes Relative: 26 %
Lymphs Abs: 1.8 10*3/uL (ref 0.7–4.0)
MCH: 31.6 pg (ref 26.0–34.0)
MCHC: 32.8 g/dL (ref 30.0–36.0)
MCV: 96.6 fL (ref 80.0–100.0)
Monocytes Absolute: 0.6 10*3/uL (ref 0.1–1.0)
Monocytes Relative: 8 %
Neutro Abs: 4.3 10*3/uL (ref 1.7–7.7)
Neutrophils Relative %: 61 %
Platelets: 235 10*3/uL (ref 150–400)
RBC: 4.14 MIL/uL (ref 3.87–5.11)
RDW: 12.7 % (ref 11.5–15.5)
WBC: 7 10*3/uL (ref 4.0–10.5)
nRBC: 0 % (ref 0.0–0.2)

## 2022-07-01 LAB — MAGNESIUM: Magnesium: 1.9 mg/dL (ref 1.7–2.4)

## 2022-07-01 MED ORDER — DEXTROSE-NACL 5-0.9 % IV SOLN: INTRAVENOUS | Status: DC

## 2022-07-01 MED ORDER — SODIUM CHLORIDE 0.9 % IV SOLN
500.0000 mg | Freq: Three times a day (TID) | INTRAVENOUS | Status: DC
  Administered 2022-07-01 – 2022-07-02 (×3): 500 mg via INTRAVENOUS
  Filled 2022-07-01 (×6): qty 2

## 2022-07-01 MED ORDER — APIXABAN 5 MG PO TABS
5.0000 mg | ORAL_TABLET | Freq: Two times a day (BID) | ORAL | Status: DC
  Administered 2022-07-02 – 2022-07-05 (×6): 5 mg via ORAL
  Filled 2022-07-01 (×7): qty 1

## 2022-07-01 NOTE — Progress Notes (Signed)
EEG complete - results pending 

## 2022-07-01 NOTE — Evaluation (Signed)
Physical Therapy Evaluation Patient Details Name: Yvonne Bailey MRN: [id number] DOB: 18-Mar-1940 Today's Date: 07/01/2022  History of Present Illness  82 y.o. female presents to [facility] hospital on 06/28/2022 AMS and periods of reduced responsiveness and lethargy. Pt was discharged for similar symptoms on 4/18, though to be related to hypothyroidism. PMHx: dementia, UTIs, CAD, s/p PCI, DVT, HTN, icm, lacunar infarct, MI, PAD, CKD3, stroke.  Clinical Impression  Pt presents to PT lethargic. Pt opens eyes initially during session but maintains them closed for the remainder of session. Pt does not respond verbally during session and does not follow any commands. Pt requires totalA for all functional mobility at this time. PT notes rigidity in BLE with significant adductor tone. Pt also appears to close eyes more tightly when PT attempts to assist in opening them. PT recommends short term inpatient PT at this time, pending the pt demonstrating an ability to participate in PT session.        Recommendations for follow up therapy are one component of a multi-disciplinary discharge planning process, led by the attending physician.  Recommendations may be updated based on patient status, additional functional criteria and insurance authorization.  Follow Up Recommendations Can patient physically be transported by private vehicle: No     Assistance Recommended at Discharge Frequent or constant Supervision/Assistance  Patient can return home with the following  Two people to help with walking and/or transfers;Two people to help with bathing/dressing/bathroom;Assistance with cooking/housework;Assistance with feeding;Direct supervision/assist for medications management;Direct supervision/assist for financial management;Assist for transportation;Help with stairs or ramp for entrance    Equipment Recommendations Hospital bed;Other (comment);Wheelchair (measurements PT) (hoyer lift)  Recommendations for Other  Services       Functional Status Assessment Patient has had a recent decline in their functional status and demonstrates the ability to make significant improvements in function in a reasonable and predictable amount of time.     Precautions / Restrictions Precautions Precautions: Fall Restrictions Weight Bearing Restrictions: No      Mobility  Bed Mobility Overal bed mobility: Needs Assistance Bed Mobility: Supine to Sit, Sit to Supine     Supine to sit: Total assist, HOB elevated Sit to supine: Total assist, HOB elevated   General bed mobility comments: pt does not assist in bed mobility    Transfers Overall transfer level: Needs assistance Equipment used: 1 person hand held assist Transfers: Sit to/from Stand Sit to Stand: Total assist           General transfer comment: PT provides knee block, assists pt into standing with totalA    Ambulation/Gait Ambulation/Gait assistance:  (deferred due to high risk for falls)                Stairs            Wheelchair Mobility    Modified Rankin (Stroke Patients Only) Modified Rankin (Stroke Patients Only) Pre-Morbid Rankin Score: Severe disability Modified Rankin: Severe disability     Balance Overall balance assessment: Needs assistance Sitting-balance support: No upper extremity supported, Feet supported Sitting balance-Leahy Scale: Zero Sitting balance - Comments: pt frequently loses balance to right side, tipping over often during session and appears to demonstrate no postural righting reactions Postural control: Right lateral lean Standing balance support: Bilateral upper extremity supported Standing balance-Leahy Scale: Zero Standing balance comment: totalA                             Pertinent Vitals/Pain  Pain Assessment Pain Assessment: PAINAD Breathing: normal Negative Vocalization: none Facial Expression: smiling or inexpressive Body Language: tense, distressed pacing,  fidgeting Consolability: no need to console PAINAD Score: 1 Pain Intervention(s): Monitored during session    Home Living Family/patient expects to be discharged to:: Private residence Living Arrangements: Children Available Help at Discharge: Family;Available PRN/intermittently;Personal care attendant Type of Home: House Home Access: Stairs to enter Entrance Stairs-Rails: Right Entrance Stairs-Number of Steps: 6   Home Layout: One level Home Equipment: Agricultural consultant (2 wheels);BSC/3in1;Shower seat;Cane - single point Additional Comments: son works during day, but she attends [employer]. history taken from previous admission 4/18 as pt is unable to report at this time    Prior Function Prior Level of Function : Needs assist             Mobility Comments: ambulates with RW ADLs Comments: pt reports having an aide to assist with some IADLs and showering, pt able to sponge bathe/dress/toilet herself, son assist with meds and IADLs.  Pt gets meals on wheels and has lunch at her daycare program.     Hand Dominance   Dominant Hand: Right    Extremity/Trunk Assessment   Upper Extremity Assessment Upper Extremity Assessment: Difficult to assess due to impaired cognition    Lower Extremity Assessment Lower Extremity Assessment: Difficult to assess due to impaired cognition (BLE adductor tone noted)    Cervical / Trunk Assessment Cervical / Trunk Assessment: Kyphotic  Communication   Communication: Receptive difficulties;Expressive difficulties  Cognition Arousal/Alertness: Lethargic Behavior During Therapy: Flat affect Overall Cognitive Status: History of cognitive impairments - at baseline                                 General Comments: pt opens eyes briefly at start of session but does not respond to any PT cues/questions. Pt later closes eyes and maintains eyes closed throughout session. Pt does not follow any commands.        General Comments  General comments (skin integrity, edema, etc.): VSS on RA    Exercises     Assessment/Plan    PT Assessment Patient needs continued PT services  PT Problem List Decreased strength;Decreased activity tolerance;Decreased balance;Decreased mobility;Decreased cognition;Decreased knowledge of use of DME;Decreased safety awareness;Decreased knowledge of precautions       PT Treatment Interventions DME instruction;Gait training;Functional mobility training;Stair training;Therapeutic activities;Therapeutic exercise;Balance training;Neuromuscular re-education;Cognitive remediation;Patient/family education;Wheelchair mobility training    PT Goals (Current goals can be found in the Care Plan section)  Acute Rehab PT Goals Patient Stated Goal: patient is unable to state, PT goal to return to transfers and ambulation PT Goal Formulation: Patient unable to participate in goal setting Time For Goal Achievement: 07/15/22 Potential to Achieve Goals: Fair    Frequency Min 2X/week     Co-evaluation               AM-PAC PT "6 Clicks" Mobility  Outcome Measure Help needed turning from your back to your side while in a flat bed without using bedrails?: Total Help needed moving from lying on your back to sitting on the side of a flat bed without using bedrails?: Total Help needed moving to and from a bed to a chair (including a wheelchair)?: Total Help needed standing up from a chair using your arms (e.g., wheelchair or bedside chair)?: Total Help needed to walk in hospital room?: Total Help needed climbing 3-5 steps with a railing? :  Total 6 Click Score: 6    End of Session Equipment Utilized During Treatment: Gait belt Activity Tolerance: Patient limited by lethargy Patient left: in bed;with call bell/phone within reach;with bed alarm set Nurse Communication: Mobility status;Need for lift equipment PT Visit Diagnosis: Other abnormalities of gait and mobility (R26.89);Other symptoms and  signs involving the nervous system (R29.898)    Time: 1610-9604 PT Time Calculation (min) (ACUTE ONLY): 23 min   Charges:   PT Evaluation $PT Eval Low Complexity: 1 Low          Arlyss Gandy, PT, DPT Acute Rehabilitation Office [phone]   Arlyss Gandy 07/01/2022, 1:27 PM

## 2022-07-01 NOTE — Progress Notes (Signed)
[facility] Progress Note Patient: Yvonne Bailey:[id number] DOB: September 15, 1940 DOA: 06/29/2022  DOS: the patient was seen and examined on 07/01/2022  Brief hospital course: Patient is a 82 year old with proximal A-fib, HTN, CKD, dementia, hypothyroidism, coronary artery disease, history of PE on Eliquis who was recently admitted to the hospital and discharged approximately 1 day ago with a similar story.  The patient has episodes of not responding up to about 1 hour where she is not talking and seems increasingly lethargic.  She was observed night and returned to her normal mentation.  At that time she was found to have undertreated hypothyroidism and her Synthroid was increased.  She had another episode today where she just stopped responding she would not talk or do anything.  Patient does have history of dementia, lives with her son and goes to a senior center daily and takes the bus to get there.  This seems so unlike her and so she was brought in for this.  Neurology recommended admission and stroke workup however after seeing her in the ED he recommended an EEG, B12 testing and optimizing her thyroid treatment as well as ruling out UTI. Assessment and Plan: Recurrent unresponsive episode. The patient was brought to the hospital with unresponsiveness and minimally talking on 4/17.  Discharged home on 4/18 as a workup was unremarkable. Brought to the hospital again on 4/20 with similar episode.  Initial stroke workup was unremarkable. On antibiotics. On 4/22 in the afternoon around 2 PM patient become unresponsive to sternal rub.  Stat CT scan of the head showed no evidence of acute bleeding.  Discussed with neurology.  CBG was 118.  EKG had bradycardia but around 50s.  Blood pressure was stable.  No hypoxia.  At around 5 PM patient back to baseline with improving mentation. Discussed with neurology.  LTM EEG initiated.  Will follow-up on per recommendation.  Acute metabolic encephalopathy   E faecalis UTI. Recently hospitalized for similar presentation. Thought to be secondary to hypothyroidism. Patient's TSH was elevated and free T4 was normal. Synthroid dose was adjusted and patient was recommended to follow-up with PCP for repeat level use. Patient was taking Synthroid after breakfast which might be contributing to poor efficacy. MRI brain negative for acute stroke. Neurology was consulted at this time as the patient presented with similar presentation. Initially was on oral antibiotics.  Now on IV antibiotics as has intermittent change in mentation and unsafe to swallow.  Will continue the same duration. Repeat PT OT evaluation.   Paroxysmal A-fib. Intermittently bradycardic.  Monitor on telemetry. Continue Eliquis.  CT unremarkable for intracranial brain.   HTN. Continue home regimen.   CKD stage II. Serum creatinine stable. Monitor.   Dementia. No agitation or behavioral issues. Monitor.  Relative B12 deficiency. B12 464. Patient will be given subcutaneous B12 as well as IV thiamine.   Subjective: No acute events overnight.  In the morning was alert and awake and following commands.  In the afternoon was unresponsive.  Again back to baseline in the evening.  Physical Exam: Clear to auscultation. S1-S2 present. Bowel sound present Able to follow all commands.  Later in the afternoon minimally responsive not able to follow any commands.  Awake and after painful stimuli.  Still withdrawing equally bilaterally.  Data Reviewed: I have Reviewed nursing notes, Vitals, and Lab results. Reviewed CBC and BMP. Reordered CBC BMP EEG CT scan of the head. Discussed with neurology.  Disposition: Status is: Observation UR team recommending observation.  Patient  is critically ill, appears to have multiple unresponsive events that requires frequent monitoring and inpatient therapy.  apixaban (ELIQUIS) tablet 5 mg   Family Communication: No one at bedside  discussed with son on the phone. Level of care: Progressive   Vitals:   07/01/22 0725 07/01/22 1109 07/01/22 1425 07/01/22 1522  BP: (!) 152/80 (!) 151/73 (!) 146/73 (!) 136/56  Pulse: (!) 57 (!) 50 (!) 37 (!) 53  Resp: 17 15    Temp: (!) 97.3 F (36.3 C) 97.9 F (36.6 C) (!) 97.3 F (36.3 C) (!) 97.5 F (36.4 C)  TempSrc: Oral Oral Oral Oral  SpO2: 100% 100% 100% 100%  Weight:      Height:         The patient is critically ill with multiple organ systems failure and requires high complexity decision making for assessment and support, frequent evaluation and titration of therapies. Critical Care Time devoted to patient care services described in this note is 35 minutes   Author: Lynden Oxford, MD 07/01/2022 7:12 PM  Please look on [email or web address] to find out who is on call.

## 2022-07-01 NOTE — Progress Notes (Signed)
NEUROLOGY CONSULTATION PROGRESS NOTE   Date of service: July 01, 2022 Patient Name: Yvonne Bailey MRN:  [id number] DOB:  [birth year]  Brief HPI  Yvonne Bailey is a 82 y.o. female with PMH significant for dementia, requires a walker to walk, CAD, prior strokes and TIAs with unclear residual deficits (family not at bedside-2020 note mentions word finding difficulty and paraphasic errors), hypertension, hyperlipidemia, frequent UTIs peripheral arterial disease-currently on aspirin and Eliquis with questionable compliance to medications, presenting for evaluation of altered mental status.  She had an episode for about 1 hour when she was very lethargic and not talking. Routine EEG was negative.  She is currently in the hospital and being treated for E faecalis UTI.   Interval Hx   In the afternoon, she had an episode of unresponsive to sternal rub, loud voice and noxious stimuli.  CT Head was obtained and was negative for any acute abnormalities.  Neurology reconsulted for further evaluation. I saw her after CT head and she was back to her baseline and eating her lunch.  Vitals   Vitals:   07/01/22 0725 07/01/22 1109 07/01/22 1425 07/01/22 1522  BP: (!) 152/80 (!) 151/73 (!) 146/73 (!) 136/56  Pulse: (!) 57 (!) 50 (!) 37 (!) 53  Resp: 17 15    Temp: (!) 97.3 F (36.3 C) 97.9 F (36.6 C) (!) 97.3 F (36.3 C) (!) 97.5 F (36.4 C)  TempSrc: Oral Oral Oral Oral  SpO2: 100% 100% 100% 100%  Weight:      Height:         Body mass index is 25.37 kg/m.  Physical Exam   General: Laying comfortably in bed; in no acute distress.  HENT: Normal oropharynx and mucosa. Normal external appearance of ears and nose.  Neck: Supple, no pain or tenderness  CV: No JVD. No peripheral edema.  Pulmonary: Symmetric Chest rise. Normal respiratory effort.  Abdomen: Soft to touch, non-tender.  Ext: No cyanosis, edema, or deformity  Skin: No rash. Normal palpation of skin.    Musculoskeletal: Normal digits and nails by inspection. No clubbing.   Neurologic Examination  Mental status/Cognition: Alert, oriented to self, place, but not to month and year, good attention.  Speech/language: Fluent, comprehension intact, object naming intact, repetition intact.  Cranial nerves:   CN II Pupils equal and reactive to light, no VF deficits    CN III,IV,VI EOM intact, no gaze preference or deviation, no nystagmus    CN V normal sensation in V1, V2, and V3 segments bilaterally    CN VII no asymmetry, no nasolabial fold flattening    CN VIII normal hearing to speech    CN IX & X normal palatal elevation, no uvular deviation    CN XI 5/5 head turn and 5/5 shoulder shrug bilaterally    CN XII midline tongue protrusion    Motor:  Muscle bulk: normal, tone normal. Mvmt Root Nerve  Muscle Right Left Comments  SA C5/6 Ax Deltoid 5 5   EF C5/6 Mc Biceps 5 5   EE C6/7/8 Rad Triceps 5 5   WF C6/7 Med FCR     WE C7/8 PIN ECU     F Ab C8/T1 U ADM/FDI 5 5   HF L1/2/3 Fem Illopsoas 5 5   KE L2/3/4 Fem Quad     DF L4/5 D Peron Tib Ant     PF S1/2 Tibial Grc/Sol      Sensation:  Light touch Intact throughout   Pin  prick    Temperature    Vibration   Proprioception    Coordination/Complex Motor:  - Finger to Nose intact BL - Heel to shin unable to do - Rapid alternating movement are slowed BL.  Labs   Basic Metabolic Panel:  Lab Results  Component Value Date   NA 140 07/01/2022   K 4.1 07/01/2022   CO2 27 07/01/2022   GLUCOSE 91 07/01/2022   BUN 23 07/01/2022   CREATININE 1.23 (H) 07/01/2022   CALCIUM 8.9 07/01/2022   GFRNONAA 44 (L) 07/01/2022   GFRAA 60 08/03/2019   HbA1c:  Lab Results  Component Value Date   HGBA1C 5.2 03/18/2022   LDL:  Lab Results  Component Value Date   LDLCALC 72 06/27/2022   Urine Drug Screen:     Component Value Date/Time   LABOPIA NONE DETECTED 06/29/2022 1351   COCAINSCRNUR NONE DETECTED 06/29/2022 1351   LABBENZ NONE  DETECTED 06/29/2022 1351   AMPHETMU NONE DETECTED 06/29/2022 1351   THCU NONE DETECTED 06/29/2022 1351   LABBARB NONE DETECTED 06/29/2022 1351    Alcohol Level     Component Value Date/Time   ETH <10 06/29/2022 1637   No results found for: "PHENYTOIN", "ZONISAMIDE", "LAMOTRIGINE", "LEVETIRACETA" No results found for: "PHENYTOIN", "PHENOBARB", "VALPROATE", "CBMZ"  Imaging and Diagnostic studies   CT Head w/o contrast: Stable cerebral atrophy, chronic small vessel disease, and old infarcts.  rEEG: This normal EEG is recorded in the waking and drowsy state. There was no seizure or seizure predisposition recorded on this study. Please note that lack of epileptiform activity on EEG does not preclude the possibility of epilepsy.    Impression   Yvonne Bailey is a 82 y.o. female with PMH significant for dementia, requires a walker to walk, CAD, prior strokes and TIAs with unclear residual deficits (family not at bedside-2020 note mentions word finding difficulty and paraphasic errors), hypertension, hyperlipidemia, frequent UTIs peripheral arterial disease-currently on aspirin and Eliquis with questionable compliance to medications, presenting for evaluation of altered mental status.  She had an episode for about 1 hour when she was very lethargic and not talking. Routine EEG was negative.  She is currently in the hospital and being treated for E faecalis UTI.  Had recurrent episode of unresponsiveness to sternal rub and neurology reconsulted. She is back to her baseline and eating lunch.  Recommendations  - given 2 episodes over the last 2 days, will keep her on LTM for spell capture. - she had CT Head as she is on eliquis which was negative for ICH. - we will continue to follow along. ______________________________________________________________________   Thank you for the opportunity to take part in the care of this patient. If you have any further questions, please contact  the neurology consultation attending.  Signed,  Erick Blinks [facility] Pager Number [phone]

## 2022-07-02 DIAGNOSIS — I482 Chronic atrial fibrillation, unspecified: Secondary | ICD-10-CM | POA: Diagnosis not present

## 2022-07-02 DIAGNOSIS — N1831 Chronic kidney disease, stage 3a: Secondary | ICD-10-CM | POA: Diagnosis present

## 2022-07-02 DIAGNOSIS — G9341 Metabolic encephalopathy: Secondary | ICD-10-CM | POA: Diagnosis present

## 2022-07-02 DIAGNOSIS — R4182 Altered mental status, unspecified: Secondary | ICD-10-CM | POA: Diagnosis not present

## 2022-07-02 DIAGNOSIS — I252 Old myocardial infarction: Secondary | ICD-10-CM | POA: Diagnosis not present

## 2022-07-02 DIAGNOSIS — B952 Enterococcus as the cause of diseases classified elsewhere: Secondary | ICD-10-CM | POA: Diagnosis present

## 2022-07-02 DIAGNOSIS — F32A Depression, unspecified: Secondary | ICD-10-CM | POA: Diagnosis present

## 2022-07-02 DIAGNOSIS — I2510 Atherosclerotic heart disease of native coronary artery without angina pectoris: Secondary | ICD-10-CM | POA: Diagnosis present

## 2022-07-02 DIAGNOSIS — E039 Hypothyroidism, unspecified: Secondary | ICD-10-CM | POA: Diagnosis present

## 2022-07-02 DIAGNOSIS — F0393 Unspecified dementia, unspecified severity, with mood disturbance: Secondary | ICD-10-CM | POA: Diagnosis present

## 2022-07-02 DIAGNOSIS — W19XXXA Unspecified fall, initial encounter: Secondary | ICD-10-CM | POA: Diagnosis present

## 2022-07-02 DIAGNOSIS — R569 Unspecified convulsions: Secondary | ICD-10-CM | POA: Diagnosis not present

## 2022-07-02 DIAGNOSIS — F03918 Unspecified dementia, unspecified severity, with other behavioral disturbance: Secondary | ICD-10-CM | POA: Diagnosis present

## 2022-07-02 DIAGNOSIS — I255 Ischemic cardiomyopathy: Secondary | ICD-10-CM | POA: Diagnosis present

## 2022-07-02 DIAGNOSIS — Z79890 Hormone replacement therapy (postmenopausal): Secondary | ICD-10-CM | POA: Diagnosis not present

## 2022-07-02 DIAGNOSIS — G4750 Parasomnia, unspecified: Secondary | ICD-10-CM | POA: Diagnosis present

## 2022-07-02 DIAGNOSIS — F919 Conduct disorder, unspecified: Secondary | ICD-10-CM | POA: Diagnosis present

## 2022-07-02 DIAGNOSIS — G928 Other toxic encephalopathy: Secondary | ICD-10-CM | POA: Diagnosis present

## 2022-07-02 DIAGNOSIS — G459 Transient cerebral ischemic attack, unspecified: Secondary | ICD-10-CM | POA: Diagnosis not present

## 2022-07-02 DIAGNOSIS — Z7901 Long term (current) use of anticoagulants: Secondary | ICD-10-CM | POA: Diagnosis not present

## 2022-07-02 DIAGNOSIS — E785 Hyperlipidemia, unspecified: Secondary | ICD-10-CM | POA: Diagnosis present

## 2022-07-02 DIAGNOSIS — I6789 Other cerebrovascular disease: Secondary | ICD-10-CM | POA: Diagnosis present

## 2022-07-02 DIAGNOSIS — R404 Transient alteration of awareness: Secondary | ICD-10-CM | POA: Diagnosis present

## 2022-07-02 DIAGNOSIS — E538 Deficiency of other specified B group vitamins: Secondary | ICD-10-CM | POA: Diagnosis present

## 2022-07-02 DIAGNOSIS — I4811 Longstanding persistent atrial fibrillation: Secondary | ICD-10-CM | POA: Diagnosis present

## 2022-07-02 DIAGNOSIS — R32 Unspecified urinary incontinence: Secondary | ICD-10-CM | POA: Diagnosis present

## 2022-07-02 DIAGNOSIS — N3000 Acute cystitis without hematuria: Secondary | ICD-10-CM | POA: Diagnosis not present

## 2022-07-02 DIAGNOSIS — N390 Urinary tract infection, site not specified: Secondary | ICD-10-CM | POA: Diagnosis present

## 2022-07-02 DIAGNOSIS — Z87891 Personal history of nicotine dependence: Secondary | ICD-10-CM | POA: Diagnosis not present

## 2022-07-02 DIAGNOSIS — I129 Hypertensive chronic kidney disease with stage 1 through stage 4 chronic kidney disease, or unspecified chronic kidney disease: Secondary | ICD-10-CM | POA: Diagnosis present

## 2022-07-02 LAB — BASIC METABOLIC PANEL
Anion gap: 9 (ref 5–15)
BUN: 30 mg/dL — ABNORMAL HIGH (ref 8–23)
CO2: 22 mmol/L (ref 22–32)
Calcium: 8.4 mg/dL — ABNORMAL LOW (ref 8.9–10.3)
Chloride: 107 mmol/L (ref 98–111)
Creatinine, Ser: 1.21 mg/dL — ABNORMAL HIGH (ref 0.44–1.00)
GFR, Estimated: 45 mL/min — ABNORMAL LOW (ref >60)
Glucose, Bld: 99 mg/dL (ref 70–99)
Potassium: 4 mmol/L (ref 3.5–5.1)
Sodium: 138 mmol/L (ref 135–145)

## 2022-07-02 LAB — GLUCOSE, CAPILLARY
Glucose-Capillary: 106 mg/dL — ABNORMAL HIGH (ref 70–99)
Glucose-Capillary: 109 mg/dL — ABNORMAL HIGH (ref 70–99)
Glucose-Capillary: 112 mg/dL — ABNORMAL HIGH (ref 70–99)
Glucose-Capillary: 118 mg/dL — ABNORMAL HIGH (ref 70–99)
Glucose-Capillary: 130 mg/dL — ABNORMAL HIGH (ref 70–99)
Glucose-Capillary: 92 mg/dL (ref 70–99)
Glucose-Capillary: 96 mg/dL (ref 70–99)

## 2022-07-02 LAB — MAGNESIUM: Magnesium: 1.6 mg/dL — ABNORMAL LOW (ref 1.7–2.4)

## 2022-07-02 MED ORDER — DEXTROSE-NACL 5-0.9 % IV SOLN: INTRAVENOUS | Status: DC

## 2022-07-02 MED ORDER — SERTRALINE HCL 50 MG PO TABS
50.0000 mg | ORAL_TABLET | Freq: Every day | ORAL | Status: DC
  Administered 2022-07-02 – 2022-07-05 (×4): 50 mg via ORAL
  Filled 2022-07-02 (×4): qty 1

## 2022-07-02 MED ORDER — MAGNESIUM SULFATE 2 GM/50ML IV SOLN
2.0000 g | Freq: Once | INTRAVENOUS | Status: AC
  Administered 2022-07-02: 2 g via INTRAVENOUS
  Filled 2022-07-02: qty 50

## 2022-07-02 MED ORDER — SODIUM CHLORIDE 0.9 % IV SOLN
1.0000 g | Freq: Three times a day (TID) | INTRAVENOUS | Status: AC
  Administered 2022-07-02 – 2022-07-03 (×2): 1 g via INTRAVENOUS
  Filled 2022-07-02 (×2): qty 1000

## 2022-07-02 NOTE — Progress Notes (Signed)
LTM maint complete - no skin breakdown under: Fp2 Fp1 Serviced P3 [facility] monitored, Event button test confirmed by [facility].

## 2022-07-02 NOTE — Progress Notes (Addendum)
NEUROLOGY CONSULTATION PROGRESS NOTE   Date of service: July 02, 2022 Patient Name: Yvonne Bailey MRN:  [id number] DOB:  10-28-1940  Brief HPI  Yvonne Bailey is a 82 y.o. female with PMH significant for dementia, requires a walker to walk, CAD, prior strokes and TIAs with unclear residual deficits (family not at bedside-2020 note mentions word finding difficulty and paraphasic errors), hypertension, hyperlipidemia, frequent UTIs peripheral arterial disease-currently on aspirin and Eliquis with questionable compliance to medications, presenting for evaluation of altered mental status.  She had an episode for about 1 hour when she was very lethargic and not talking. Routine EEG was negative.  She is currently in the hospital and being treated for E faecalis UTI.  In the afternoon on 07/01/22, she had an episode of unresponsive to sternal rub, loud voice and noxious stimuli. CT Head was obtained and was negative for any acute abnormalities.  Interval Hx   Had episode of poor responsiveness again this AM while on LTM EEG. Non epileptic.  Vitals   Vitals:   07/01/22 1955 07/02/22 0000 07/02/22 0335 07/02/22 0734  BP: 125/69 136/61 (!) 130/58 125/77  Pulse: 63 61 (!) 55 (!) 55  Resp: Temp: 97.6 F (36.4 C) 98.4 F (36.9 C) 97.6 F (36.4 C) 97.9 F (36.6 C)  TempSrc: Oral Oral Oral Oral  SpO2: 99% 97% 100% 100%  Weight:      Height:         Body mass index is 25.37 kg/m.  Physical Exam   General: Laying comfortably in bed; in no acute distress.  HENT: Normal oropharynx and mucosa. Normal external appearance of ears and nose.  Neck: Supple, no pain or tenderness  CV: No JVD. No peripheral edema.  Pulmonary: Symmetric Chest rise. Normal respiratory effort.  Abdomen: Soft to touch, non-tender.  Ext: No cyanosis, edema, or deformity  Skin: No rash. Normal palpation of skin.   Musculoskeletal: Normal digits and nails by inspection. No clubbing.    Neurologic Examination  Mental status/Cognition: Alert, oriented to self, place, but not to month and year, good attention.  Speech/language: Fluent, comprehension intact, object naming intact, repetition intact.  Cranial nerves:   CN II Pupils equal and reactive to light, no VF deficits    CN III,IV,VI EOM intact, no gaze preference or deviation, no nystagmus    CN V normal sensation in V1, V2, and V3 segments bilaterally    CN VII no asymmetry, no nasolabial fold flattening    CN VIII normal hearing to speech    CN IX & X normal palatal elevation, no uvular deviation    CN XI 5/5 head turn and 5/5 shoulder shrug bilaterally    CN XII midline tongue protrusion    Motor:  Muscle bulk: normal, tone normal. Mvmt Root Nerve  Muscle Right Left Comments  SA C5/6 Ax Deltoid 5 5   EF C5/6 Mc Biceps 5 5   EE C6/7/8 Rad Triceps 5 5   WF C6/7 Med FCR     WE C7/8 PIN ECU     F Ab C8/T1 U ADM/FDI 5 5   HF L1/2/3 Fem Illopsoas 5 5   KE L2/3/4 Fem Quad     DF L4/5 D Peron Tib Ant     PF S1/2 Tibial Grc/Sol      Sensation:  Light touch Intact throughout   Pin prick    Temperature    Vibration   Proprioception    Coordination/Complex  Motor:  - Finger to Nose intact BL - Heel to shin unable to do - Rapid alternating movement are slowed BL.  Labs   Basic Metabolic Panel:  Lab Results  Component Value Date   NA 138 07/02/2022   K 4.0 07/02/2022   CO2 22 07/02/2022   GLUCOSE 99 07/02/2022   BUN 30 (H) 07/02/2022   CREATININE 1.21 (H) 07/02/2022   CALCIUM 8.4 (L) 07/02/2022   GFRNONAA 45 (L) 07/02/2022   GFRAA 60 08/03/2019   HbA1c:  Lab Results  Component Value Date   HGBA1C 5.2 03/18/2022   LDL:  Lab Results  Component Value Date   LDLCALC 72 06/27/2022   Urine Drug Screen:     Component Value Date/Time   LABOPIA NONE DETECTED 06/29/2022 1351   COCAINSCRNUR NONE DETECTED 06/29/2022 1351   LABBENZ NONE DETECTED 06/29/2022 1351   AMPHETMU NONE DETECTED 06/29/2022  1351   THCU NONE DETECTED 06/29/2022 1351   LABBARB NONE DETECTED 06/29/2022 1351    Alcohol Level     Component Value Date/Time   ETH <10 06/29/2022 1637   No results found for: "PHENYTOIN", "ZONISAMIDE", "LAMOTRIGINE", "LEVETIRACETA" No results found for: "PHENYTOIN", "PHENOBARB", "VALPROATE", "CBMZ"  Imaging and Diagnostic studies   CT Head w/o contrast: Stable cerebral atrophy, chronic small vessel disease, and old infarcts.  rEEG: This normal EEG is recorded in the waking and drowsy state. There was no seizure or seizure predisposition recorded on this study. Please note that lack of epileptiform activity on EEG does not preclude the possibility of epilepsy.    Impression   Yvonne Bailey is a 82 y.o. female with PMH significant for dementia, requires a walker to walk, CAD, prior strokes and TIAs with unclear residual deficits (family not at bedside-2020 note mentions word finding difficulty and paraphasic errors), hypertension, hyperlipidemia, frequent UTIs peripheral arterial disease-currently on aspirin and Eliquis with questionable compliance to medications, presenting for evaluation of altered mental status.  She had an episode for about 1 hour when she was very lethargic and not talking. Routine EEG was negative. She is currently in the hospital and being treated for E faecalis UTI. Had recurrent episode of unresponsiveness to sternal rub and neurology reconsulted.  She is placed on LTM EEG and an unresponsive event captured on LTM with normal EEG during the event. Recommendations  - episode either parasomnia or behavioral. Can be safely worked up outpatient. Will discontinue LTM EEG and we will signoff. Please feel free to contact us with any questions or concerns. - follow up with sleep clinic outpatient.  ______________________________________________________________________   Thank you for the opportunity to take part in the care of this patient. If you have  any further questions, please contact the neurology consultation attending.  Signed,  Erick Blinks [facility] Pager Number [phone]

## 2022-07-02 NOTE — Progress Notes (Signed)
LTM EEG discontinued, no skin breakdown at unhook. 

## 2022-07-02 NOTE — Consult Note (Signed)
[facility] Psychiatry New Face-to-Face Psychiatric Evaluation   Service Date: July 02, 2022 LOS:  LOS: 0 days    Assessment  Yvonne Bailey is a 82 y.o. female admitted medically for 06/29/2022  1:09 PM for confusion and foul smelling urine. She carries the psychiatric diagnoses of ?depression and extensive PMH of CAD, HTN, HLD, hypothyroidism, cardiomyopathy, lichen simplex, TIA.Psychiatry was consulted for recurrent unresponsive episodes by Dr. Edsel Bailey.      It's not clear to me what is causing her recurrent unresponsive episodes. She doesn't have any major red flags that would push towards a diagnosis of conversion disorder (recent significant increase in stress, worsening of other comorbid conditions, clear personality pathology) and does not fit the typical patient profile for this condition. I understand she is still undergoing workup for medical causes, with parasomnias and vestibular dysfunction not ruled out. I would recommend ongoing workup with this, as a behavioral component is a diagnosis of exclusion. Delirium is possible although less likely with the quick on/off nature of these episodes as reported to me. I would furthermore recommend ongoing treatment of hypothyroidism (recent sx of depression of fatigue, amotivation, oversleeping, etc). Her home zoloft was not restarted this admission; starting at 50% of home dose (as sx of depression again c/w hypothyroidism); it would be reasonable to increase back to 100 mg if there are any sx of depression in 2-3 weeks. She had fairly marked memory deficits that are described below; compliane to medications is dependent on home health and on her son.  I would not start medication for hallucinations at this time especially in this patient with bradycardia as patient sees these 3 children sporadically, has fair reality testing when it occurs, and they are not distressing to her or causing behavioral disturbances.  There is no indication  for psychiatric hospitalization at this time Please see plan below for detailed recommendations.   I discussed this plan with the granddaughter who voiced agreement.  Diagnoses:  Active Hospital problems: Principal Problem:   TIA (transient ischemic attack) Active Problems:   Hypothyroidism   Dyslipidemia   Essential hypertension   Peripheral vascular disease (HCC)   Acute thromboembolism of deep veins of lower extremity   Small vessel disease, cerebrovascular   Atrial fibrillation   Coronary artery disease involving native coronary artery without angina pectoris   Stage 3 chronic kidney disease   UTI (urinary tract infection)   Altered mental status   Recurrent episodes of unresponsiveness     Plan  ## Safety and Observation Level:  - Based on my clinical evaluation, I estimate the patient to be at low risk of self harm in the current setting - At this time, we recommend a routine level of observation. This decision is based on my review of the chart including patient's history and current presentation, interview of the patient, mental status examination, and consideration of suicide risk including evaluating suicidal ideation, plan, intent, suicidal or self-harm behaviors, risk factors, and protective factors. This judgment is based on our ability to directly address suicide risk, implement suicide prevention strategies and develop a safety plan while the patient is in the clinical setting. Please contact our team if there is a concern that risk level has changed.   ## Medications:  -- s sertraline 50 mg  - reasonable to increase to 100 mg if sx of depression continue in 2-3 weeks  -- no antipsychotics at this time  ## Medical Decision Making Capacity:  Not formally assessed  ##  Further Work-up:  -- vestibular fx, parasomnias    -- most recent EKG on 4/23 had QtC of 424 -- Pertinent labwork reviewed earlier this and prior admission includes: bradycardia  ##  Disposition:  -- per primary  ## Behavioral / Environmental:  -- please speak loudly and clearly   Thank you for this consult request. Recommendations have been communicated to the primary team.  We will sign off at this time.   Yvonne Bailey A Yvonne Bailey   New history  Relevant Aspects of Hospital Course:  Admitted on 06/29/2022 for confusion. Has had a few episodes of unresponsiveness; no EEG changes. Per Dr. Edsel Bailey no changes in HR during these episodes either.  Patient Report:  Patient in room with granddaughter present for majority of assessment with patient consent.  When I entered the room she is struggling to sip through a straw that still has the paper Attached to it (removed this).  She is oriented to self and location although does not know why she is in the hospital.  She does not know what year it is "2000. 3000. 2004 "".  She guesses that it is fall when asked what season it is but correctly identifies that it is spring after being told Yvonne Bailey is a most recent major holiday.  She knows that Yvonne Bailey is the current president and describes Yvonne Bailey as the previous president.  Feels her memory is "terrible" but does not know how long her memory struggles have been going on with.   She was unsure if she had ever been diagnosed with a psychiatric condition.  She thinks that she takes pills for depression because she takes "so many pills".  S her mood is "I don't know" today but does go up and down.  Feels she sleeps very well, has many interests that she enjoys, no feelings of guilt/worthlessness, some decreased energy and concentration.  Appetite is good.  Psychomotor symptoms were notable for jaw tremor.  She denied current and historic suicidality.  Brief screen for mania was negative.  Brief screen for psychosis was positive for isolated visual hallucinations.  She makes many jokes and laughs frequently throughout presentation.  ROS:  See above.   Collateral information:  Today is a good  day both for mood and overall cognitive functioning.  Psychiatric History:  From granddaughter-diagnosed with depression.  Most prominent symptoms were lying in bed and not getting out unless someone forced her to.  Does not seem to be particularly worse lately.    Social History:  Lives with son.  She is religious and enjoys going to church.  She was married twice-second marriage was "all right".  Tobacco use: quit 40 years ago Alcohol use: 1 glass wine/year Drug use: no  Family History:  The patient's family history includes Coronary artery disease in her brother; Diabetes in her mother; Hypertension in her mother; Stroke in her brother.  Medical History: Past Medical History:  Diagnosis Date   Bilateral leg edema 07/19/2015   CAD (coronary artery disease)    a. anterior MI s/p PCI in 1992. b. cath 08/2015 with severe three-vessel CAD turned down for CABG and underwent DESx5 to prox Cx/OM2/D1/oRCA/mRCA   Carotid artery disease    a. carotid duplex 03/2015 showed 1-39% BICA, normal subclavian arteries, chronically occluded left vertebral, f/u recommended only PRN.   DVT (deep venous thrombosis)    X1   History of nuclear stress test    a. Myoview 6/17: EF 20-25%, mid anteroseptal, apical anterior, apical septal, apical  inferior, apical lateral and apical scar, no ischemia, intermediate risk   HTN (hypertension)    Hyperlipidemia    Hypokalemia    Hypothyroidism    Ischemic cardiomyopathy    a. Echo 6/17: EF 20-25%, apex appears akinetic, MAC, moderate MR, moderate LAE, mild RVE, trivial PI, PASP 47 mmHg (needs repeat with Definity contrast).  b. Limited echo with Definity contrast 7/17: EF 25-30%, moderate to severe LAE. c. Limited Echo 2018 showed EF 40-45%.   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 02/2017 in setting of noncompliance with Eliquis   TIA (transient ischemic attack)    Tricuspid regurgitation     Surgical History: Past Surgical History:  Procedure Laterality Date   APPENDECTOMY     at hysterectomy and USO for fibroids, Dr. Kizzie Bane   CARDIAC CATHETERIZATION  1992   Dr Charlies Constable   CARDIAC CATHETERIZATION N/A 09/06/2015   Procedure: Right/Left Heart Cath and Coronary Angiography;  Surgeon: Kathleene Hazel, MD;  Location: [facility] INVASIVE CV LAB;  Service: Cardiovascular;  Laterality: N/A;   CARDIAC CATHETERIZATION N/A 09/07/2015   Procedure: Coronary Stent Intervention;  Surgeon: Kathleene Hazel, MD;  Location: [facility] INVASIVE CV LAB;  Service: Cardiovascular;  Laterality: N/A;   COLONOSCOPY     negative; 2008, Dr. Lina Sar   fracture LLE     '94; pinned   IR ANGIO INTRA EXTRACRAN SEL COM CAROTID INNOMINATE BILAT MOD SED  03/02/2017   IR ANGIO INTRA EXTRACRAN SEL COM CAROTID INNOMINATE BILAT MOD SED  04/23/2018   IR ANGIO VERTEBRAL SEL SUBCLAVIAN INNOMINATE BILAT MOD SED  04/23/2018   IR ANGIO VERTEBRAL SEL VERTEBRAL UNI R MOD SED  03/02/2017   IR RADIOLOGIST EVAL & MGMT  04/28/2017   IR US GUIDE VASC ACCESS RIGHT  04/23/2018   RADIOLOGY WITH ANESTHESIA N/A 03/02/2017   Procedure: RADIOLOGY WITH ANESTHESIA;  Surgeon: Julieanne Cotton, MD;  Location: [facility] OR;  Service: Radiology;  Laterality: N/A;   TEE WITHOUT CARDIOVERSION N/A 11/02/2020   Procedure: TRANSESOPHAGEAL ECHOCARDIOGRAM (TEE);  Surgeon: Thurmon Fair, MD;  Location: [facility] ENDOSCOPY;  Service: Cardiovascular;  Laterality: N/A;   TONSILLECTOMY     TOTAL ABDOMINAL HYSTERECTOMY     & BSO for fibroids    Medications:   Current Facility-Administered Medications:    0.9 %  sodium chloride infusion, 250 mL, Intravenous, PRN, Reva Bores, MD   acetaminophen (TYLENOL)  tablet 650 mg, 650 mg, Oral, Q6H PRN **OR** acetaminophen (TYLENOL) suppository 650 mg, 650 mg, Rectal, Q6H PRN, Reva Bores, MD   ampicillin (OMNIPEN) 1 g in sodium chloride 0.9 % 100 mL IVPB, 1 g, Intravenous, Q8H, Rolly Salter, MD, Stopped at 07/02/22 1715   apixaban (ELIQUIS) tablet 5 mg, 5 mg, Oral, BID, Rolly Salter, MD   aspirin EC tablet 81 mg, 81 mg, Oral, Daily, Rolly Salter, MD, 81 mg at 07/01/22 0907   dextrose 5 %-0.9 % sodium chloride infusion, , Intravenous, Continuous, Rolly Salter, MD, Last Rate: 75 mL/hr at 07/02/22 1715, Restarted at 07/02/22 1715   dextrose 5 %-0.9 % sodium chloride infusion, , Intravenous, Continuous, Lynden Oxford M,  MD, Last Rate: 75 mL/hr at 07/02/22 1052, New Bag at 07/02/22 1052   dorzolamide-timolol (COSOPT) 2-0.5 % ophthalmic solution 1 drop, 1 drop, Both Eyes, BID, Reva Bores, MD, 1 drop at 07/01/22 2054   latanoprost (XALATAN) 0.005 % ophthalmic solution 1 drop, 1 drop, Both Eyes, QHS, Reva Bores, MD, 1 drop at 07/01/22 2055   ondansetron (ZOFRAN) tablet 4 mg, 4 mg, Oral, Q6H PRN **OR** ondansetron (ZOFRAN) injection 4 mg, 4 mg, Intravenous, Q6H PRN, Reva Bores, MD   sertraline (ZOLOFT) tablet 50 mg, 50 mg, Oral, Daily, Shawnese Magner A, 50 mg at 07/02/22 1750   sodium chloride flush (NS) 0.9 % injection 3 mL, 3 mL, Intravenous, Q12H, Reva Bores, MD, 3 mL at 07/01/22 2055   sodium chloride flush (NS) 0.9 % injection 3 mL, 3 mL, Intravenous, PRN, Reva Bores, MD, 3 mL at 06/29/22 2153  Allergies: Allergies  Allergen Reactions   Definity [Perflutren Lipid Microsphere] Other (See Comments)    Patient experienced back pain with injection   Pletal [Cilostazol] Other (See Comments)    Made pt "feel funny"       Objective  Vital signs:  Temp:  [97.4 F (36.3 C)-98.4 F (36.9 C)] 97.5 F (36.4 C) (04/23 1657) Pulse Rate:  [55-63] 60 (04/23 1657) Resp:  [16-19] 18 (04/23 1657) BP: (125-177)/(53-84) 127/53  (04/23 1657) SpO2:  [97 %-100 %] 100 % (04/23 1657)  Psychiatric Specialty Exam:  Presentation  General Appearance: Appropriate for Environment  Eye Contact:Good  Speech:Clear and Coherent  Speech Volume:Normal  Handedness:No data recorded  Mood and Affect  Mood: I don't know Affect:Congruent; Full Range   Thought Process  Thought Processes:Coherent  Descriptions of Associations:Tangential  Orientation:Full (Time, Place and Person)  Thought Content:-- (devoid of delusions or paranoia)  History of Schizophrenia/Schizoaffective disorder:No data recorded Duration of Psychotic Symptoms:No data recorded Hallucinations:Hallucinations: Visual Description of Visual Hallucinations: 3 children  Ideas of Reference:No data recorded Suicidal Thoughts:Suicidal Thoughts: No  Homicidal Thoughts:Homicidal Thoughts: No   Sensorium  Memory:Immediate Poor; Recent Poor; Remote Fair  Judgment:Fair  Insight:Fair   Executive Functions  Concentration:Poor  Attention Span:Fair  Recall:Poor  Fund of Knowledge:Poor  Language:Good   Psychomotor Activity  Psychomotor Activity:Psychomotor Activity: Normal   Assets  Assets:Desire for Improvement; Social Support; Resilience   Sleep  Sleep:Sleep: Fair    Physical Exam: Physical Exam HENT:     Head: Normocephalic.  Cardiovascular:     Rate and Rhythm: Normal rate.     Comments: Pulse in low 60s when I checked  Skin:    General: Skin is warm and dry.  Neurological:     Mental Status: She is alert.     Blood pressure (!) 127/53, pulse 60, temperature (!) 97.5 F (36.4 C), temperature source Oral, resp. rate 18, height  (1.702 m), weight 73.5 kg, SpO2 100 %. Body mass index is 25.37 kg/m.

## 2022-07-02 NOTE — Hospital Course (Addendum)
Patient is a 82 year old with proximal A-fib, HTN, CKD, dementia, hypothyroidism, coronary artery disease, history of PE on Eliquis who was recently admitted to the hospital and discharged approximately 1 day ago with a similar story.  The patient has episodes of not responding up to about 1 hour where she is not talking and seems increasingly lethargic.  She was observed night and returned to her normal mentation.  At that time she was found to have undertreated hypothyroidism and her Synthroid was increased.  She had another episode where she just stopped responding she would not talk or do anything.  Patient does have history of dementia, lives with her son and goes to a senior center daily and takes the bus to get there.  This seems so unlike her and so she was brought in for this.   Neurology was consulted.  LTM EEG negative.  At the time of the event which were observed in the hospital no bradycardia seen on the monitor.  Neurology felt that this could be parasomnia or behavioral issues and psychiatry was consulted. Son reports that the patient had some word dizziness sensation with TV spinning around before the event twice.  Checking vestibular evaluation.

## 2022-07-02 NOTE — Progress Notes (Addendum)
[facility] Progress Note Patient: Yvonne Bailey ZOX:[id number] DOB: [birth year] DOA: 06/29/2022  DOS: the patient was seen and examined on 07/02/2022  Brief hospital course: Patient is a 82 year old with proximal A-fib, HTN, CKD, dementia, hypothyroidism, coronary artery disease, history of PE on Eliquis who was recently admitted to the hospital and discharged approximately 1 day ago with a similar story.  The patient has episodes of not responding up to about 1 hour where she is not talking and seems increasingly lethargic.  She was observed night and returned to her normal mentation.  At that time she was found to have undertreated hypothyroidism and her Synthroid was increased.  She had another episode where she just stopped responding she would not talk or do anything.  Patient does have history of dementia, lives with her son and goes to a senior center daily and takes the bus to get there.  This seems so unlike her and so she was brought in for this.   Neurology was consulted.  LTM EEG negative.  At the time of the event which were observed in the hospital no bradycardia seen on the monitor.  Neurology felt that this could be parasomnia or behavioral issues and psychiatry was consulted. Son reports that the patient had some word dizziness sensation with TV spinning around before the event twice.  Checking vestibular evaluation.  Assessment and Plan: Recurrent unresponsive episode. The patient was brought to the hospital with unresponsiveness and minimally talking on 4/17.  Discharged home on 4/18 as a workup was unremarkable. Brought to the hospital again on 4/20 with similar episode.  Initial stroke workup was unremarkable. Already on antibiotics to treat UTI. On 4/22 in the afternoon around 2 PM patient become unresponsive to sternal rub.  Stat CT scan of the head showed no evidence of acute bleeding.  Discussed with neurology.  CBG was 118.  EKG had bradycardia but around 50s.  Blood  pressure was stable.  No hypoxia.  At around 5 PM patient back to baseline with improving mentation. Discussed with neurology.  LTM EEG initiated. At 9:30 AM on 4/23 patient had reoccurrence of the event.  Early in the morning patient was responding and able to answer questions but at that time patient was unresponsive. LTM EEG showed no evidence of ongoing seizures. And telemetry did not show any evidence of acute arrhythmias. Neurology felt that this could be related to parasomnia or behavioral issues. Son reports that the patient had a sensation of room spinning around before she passed out twice. Neurology currently signed off. Psychiatry was consulted. Vestibular rehab evaluation is requested.   Acute metabolic encephalopathy  E faecalis UTI. Recently hospitalized for similar presentation. Thought to be secondary to hypothyroidism. Patient's TSH was elevated and free T4 was normal. Synthroid dose was adjusted and patient was recommended to follow-up with PCP for repeat level use. Patient was taking Synthroid after breakfast which might be contributing to poor efficacy. MRI brain negative for acute stroke. Neurology was consulted at this time as the patient presented with similar presentation. Initially was on oral antibiotics.  Now on IV antibiotics as has intermittent change in mentation and unsafe to swallow.  Will continue the same duration. Repeat PT OT evaluation.   Paroxysmal A-fib. Intermittently bradycardic.  Monitor on telemetry. Continue Eliquis.  CT unremarkable for intracranial brain.   HTN. Continue home regimen.   CKD stage II. Serum creatinine relatively stable but in an uptrend.  Given IV fluids. Monitor.   Mild hypomagnesemia. Replacing.Marland Kitchen  Dementia. No agitation or behavioral issues. Monitor.   Relative B12 deficiency. B12 464. Patient will be given subcutaneous B12 as well as IV thiamine.   Subjective: No nausea no vomiting reoccurrence of event  early in the morning.  In the afternoon after lunch patient is alert awake and oriented and answering questions and following commands.  No acute complaints.  Physical Exam: General: in Mild distress, No Rash Cardiovascular: S1 and S2 Present, No Murmur Respiratory: Good respiratory effort, Bilateral Air entry present. No Crackles, No wheezes Abdomen: Bowel Sound present, No tenderness Extremities: No edema Neuro: Obtunded, unable to respond to verbal stimuli, responds to painful stimuli in the morning, in the afternoon alert and oriented x3, no new focal deficit  Data Reviewed: I have Reviewed nursing notes, Vitals, and Lab results. Since last encounter, pertinent lab results CBC and BMP   . I have ordered test including CBC and BMP  . I have discussed pt's care plan and test results with neurology and psychiatry  .   Disposition: Status is: Inpatient Remains inpatient appropriate because: Need further workup for unresponsive events  apixaban (ELIQUIS) tablet 5 mg   Family Communication: Son at bedside Level of care: Telemetry Medical   Vitals:   07/02/22 0734 07/02/22 1141 07/02/22 1202 07/02/22 1657  BP: 125/77 (!) 177/84 (!) 154/75 (!) 127/53  Pulse: (!) 55 (!) 57  60  Resp: Temp: 97.9 F (36.6 C) (!) 97.4 F (36.3 C)  (!) 97.5 F (36.4 C)  TempSrc: Oral Axillary  Oral  SpO2: 100% 98%  100%  Weight:      Height:         Author: Lynden Oxford, MD 07/02/2022 5:43 PM  Please look on [email or web address] to find out who is on call.

## 2022-07-02 NOTE — Progress Notes (Addendum)
PHARMACY NOTE:  ANTIMICROBIAL RENAL DOSAGE ADJUSTMENT  Current antimicrobial regimen includes a mismatch between antimicrobial dosage and estimated renal function.  As per policy approved by the Pharmacy & Therapeutics and Medical Executive Committees, the antimicrobial dosage will be adjusted accordingly.  Current antimicrobial dosage:  ampicillin  IV q8h  Indication: UTI (prev on amoxicillin but currently NPO)  Renal Function:  Estimated Creatinine Clearance: 35.5 mL/min (A) (by C-G formula based on SCr of 1.21 mg/dL (H)).      On intermittent HD, scheduled:      On CRRT    Antimicrobial dosage has been changed to:  ampicillin 1g IV q8h  Additional comments:   Thank you for allowing pharmacy to be a part of this patient's care.  Rexford Maus, PharmD, BCPS 07/02/2022 11:13 AM

## 2022-07-02 NOTE — Evaluation (Signed)
Occupational Therapy Evaluation Patient Details Name: Yvonne Bailey MRN: [id number] DOB: 09/02/1940 Today's Date: 07/02/2022   History of Present Illness 82 y.o. female presents to [facility] hospital on 06/28/2022 AMS and periods of reduced responsiveness and lethargy. Pt was discharged for similar symptoms on 4/18, though to be related to hypothyroidism. Routine EEG negative. CT head negative. LTM EEG negative.  PMHx: dementia, UTIs, CAD, s/p PCI, DVT, HTN, icm, lacunar infarct, MI, PAD, CKD3, stroke.   Clinical Impression   Patient admitted for above and presents with problem list below.  Familiar with pt from last admission, PTA patient able to ambulate with RW and complete ADLs with some assist; attending day program while her son worked.  Today, she keeps eyes closed and is not following commands.  She requires total assist for all self care and bed mobility.  She resists movement of hand towards face with hand over hand with attempt to wash face.  No verbalizations attempted. Will follow acutely, at this time recommend continued OT services at inpatient setting with <3hours/day.      Recommendations for follow up therapy are one component of a multi-disciplinary discharge planning process, led by the attending physician.  Recommendations may be updated based on patient status, additional functional criteria and insurance authorization.   Assistance Recommended at Discharge Frequent or constant Supervision/Assistance  Patient can return home with the following Assistance with cooking/housework;Direct supervision/assist for medications management;Direct supervision/assist for financial management;Assist for transportation;Help with stairs or ramp for entrance;Two people to help with walking and/or transfers;Two people to help with bathing/dressing/bathroom;Assistance with feeding    Functional Status Assessment  Patient has had a recent decline in their functional status and demonstrates the  ability to make significant improvements in function in a reasonable and predictable amount of time.  Equipment Recommendations  Other (comment) (defer)    Recommendations for Other Services       Precautions / Restrictions Precautions Precautions: Fall Restrictions Weight Bearing Restrictions: No      Mobility Bed Mobility Overal bed mobility: Needs Assistance Bed Mobility: Supine to Sit, Sit to Supine     Supine to sit: Total assist, HOB elevated Sit to supine: Total assist, HOB elevated   General bed mobility comments: pt does not assist in bed mobility    Transfers                   General transfer comment: deferred      Balance Overall balance assessment: Needs assistance Sitting-balance support: No upper extremity supported, Feet supported Sitting balance-Leahy Scale: Fair Sitting balance - Comments: min guard statically, once upright pt able to hold balance wtihout assist. dynamic balance NT                                   ADL either performed or assessed with clinical judgement   ADL Overall ADL's : Needs assistance/impaired                                     Functional mobility during ADLs: Total assistance General ADL Comments: total assist     Vision   Additional Comments: keeps eyes closed, does not open to command     Perception     Praxis      Pertinent Vitals/Pain Pain Assessment Pain Assessment: Faces Faces Pain Scale: No hurt  Hand Dominance Right   Extremity/Trunk Assessment Upper Extremity Assessment Upper Extremity Assessment: Difficult to assess due to impaired cognition (pt resisting movement to bring both UEs towards face, holds L UE up with therapist lets go but allows R UE to drop to bed without control.)   Lower Extremity Assessment Lower Extremity Assessment: Defer to PT evaluation   Cervical / Trunk Assessment Cervical / Trunk Assessment: Kyphotic   Communication  Communication Communication: Receptive difficulties;Expressive difficulties   Cognition Arousal/Alertness: Lethargic Behavior During Therapy: Flat affect Overall Cognitive Status: History of cognitive impairments - at baseline                                 General Comments: pt opens eyes briefly at start of session but does not respond to any PT cues/questions. Pt later closes eyes and maintains eyes closed throughout session. Pt does not follow any commands.     General Comments  VSS on RA, HR 60s    Exercises     Shoulder Instructions      Home Living Family/patient expects to be discharged to:: Skilled nursing facility                                        Prior Functioning/Environment Prior Level of Function : Needs assist;Patient poor historian/Family not available (per chart review from last admission)             Mobility Comments: ambulates with RW ADLs Comments: pt reports having an aide to assist with some IADLs and showering, pt able to sponge bathe/dress/toilet herself, son assist with meds and IADLs.  Pt gets meals on wheels and has lunch at her daycare program.        OT Problem List: Decreased strength;Decreased activity tolerance;Impaired balance (sitting and/or standing);Decreased cognition;Decreased safety awareness;Decreased knowledge of use of DME or AE;Decreased knowledge of precautions      OT Treatment/Interventions: Self-care/ADL training;DME and/or AE instruction;Therapeutic activities;Balance training;Patient/family education;Cognitive remediation/compensation    OT Goals(Current goals can be found in the care plan section) Acute Rehab OT Goals Patient Stated Goal: none stated OT Goal Formulation: Patient unable to participate in goal setting Time For Goal Achievement: 07/16/22 Potential to Achieve Goals: Poor  OT Frequency: Min 2X/week    Co-evaluation              AM-PAC OT "6 Clicks" Daily Activity      Outcome Measure Help from another person eating meals?: Total Help from another person taking care of personal grooming?: Total Help from another person toileting, which includes using toliet, bedpan, or urinal?: Total Help from another person bathing (including washing, rinsing, drying)?: Total Help from another person to put on and taking off regular upper body clothing?: Total Help from another person to put on and taking off regular lower body clothing?: Total 6 Click Score: 6   End of Session Nurse Communication: Mobility status;Other (comment) (bed soiled in urnie)  Activity Tolerance: Patient limited by lethargy Patient left: in bed;with call bell/phone within reach;with bed alarm set;with nursing/sitter in room  OT Visit Diagnosis: Other abnormalities of gait and mobility (R26.89);Muscle weakness (generalized) (M62.81);Other symptoms and signs involving cognitive function                Time: 1049-1107 OT Time Calculation (min): 18 min Charges:  OT General Charges $OT  Visit: 1 Visit OT Evaluation $OT Eval Moderate Complexity: 1 Mod  Barry Brunner, OT Acute Rehabilitation Services Office [phone]   Chancy Milroy 07/02/2022, 11:30 AM

## 2022-07-02 NOTE — Procedures (Addendum)
Patient Name: Yvonne Bailey  MRN: [id number]  Epilepsy Attending: Charlsie Quest  Referring Physician/Provider: Dr Erick Blinks, Dr Lynden Oxford Duration: 07/01/2022 1451 to 07/02/2022 1055  Patient history: 81yo F with ams getting eeg to evaluate for seizure  Level of alertness: Awake, asleep  AEDs during EEG study: None  Technical aspects: This EEG study was done with scalp electrodes positioned according to the 10-20 International system of electrode placement. Electrical activity was reviewed with band pass filter of 1-70Hz , sensitivity of 7 uV/mm, display speed of 36mm/sec with a  notched filter applied as appropriate. EEG data were recorded continuously and digitally stored.  Video monitoring was available and reviewed as appropriate.  Description: The posterior dominant rhythm consists of 8-9 Hz activity of moderate voltage (25-35 uV) seen predominantly in posterior head regions, symmetric and reactive to eye opening and eye closing. Sleep was characterized by vertex waves, sleep spindles (12 to 14 Hz), maximal frontocentral region. Hyperventilation and photic stimulation were not performed.     Event button was pressed on 07/02/2022 at 0943.  Patient was difficult to arouse.  Concomitant EEG before, during and after the event showed normal posterior dominant rhythm.  IMPRESSION: This study is within normal limits. No seizures or epileptiform discharges were seen throughout the recording.  Event button was pressed on 07/02/2022 at 0943.  Patient was difficult to arouse without concomitant EEG change.  This was a nonepileptic event.  A normal interictal EEG does not exclude the diagnosis of epilepsy.  Yvonne Bailey

## 2022-07-02 NOTE — Progress Notes (Signed)
SLP Cancellation Note  Patient Details Name: ANNIKA SELKE MRN: [id number] DOB: [birth year]   Cancelled treatment:       Reason Eval/Treat Not Completed: Patient unavailable - with psychiatry. Will continue efforts.  Geanna Divirgilio L. Samson Frederic, MA CCC/SLP Clinical Specialist - Acute Care SLP Acute Rehabilitation Services Office number [phone]    Blenda Mounts Laurice 07/02/2022, 2:48 PM

## 2022-07-03 DIAGNOSIS — I482 Chronic atrial fibrillation, unspecified: Secondary | ICD-10-CM | POA: Diagnosis not present

## 2022-07-03 DIAGNOSIS — G459 Transient cerebral ischemic attack, unspecified: Secondary | ICD-10-CM | POA: Diagnosis not present

## 2022-07-03 DIAGNOSIS — E039 Hypothyroidism, unspecified: Secondary | ICD-10-CM

## 2022-07-03 DIAGNOSIS — R4182 Altered mental status, unspecified: Secondary | ICD-10-CM | POA: Diagnosis not present

## 2022-07-03 DIAGNOSIS — I739 Peripheral vascular disease, unspecified: Secondary | ICD-10-CM

## 2022-07-03 DIAGNOSIS — I679 Cerebrovascular disease, unspecified: Secondary | ICD-10-CM

## 2022-07-03 DIAGNOSIS — N3000 Acute cystitis without hematuria: Secondary | ICD-10-CM | POA: Diagnosis not present

## 2022-07-03 DIAGNOSIS — I10 Essential (primary) hypertension: Secondary | ICD-10-CM

## 2022-07-03 LAB — CBC WITH DIFFERENTIAL/PLATELET
Abs Immature Granulocytes: 0.02 10*3/uL (ref 0.00–0.07)
Basophils Absolute: 0.1 10*3/uL (ref 0.0–0.1)
Basophils Relative: 1 %
Eosinophils Absolute: 0.3 10*3/uL (ref 0.0–0.5)
Eosinophils Relative: 3 %
HCT: 36.3 % (ref 36.0–46.0)
Hemoglobin: 11.7 g/dL — ABNORMAL LOW (ref 12.0–15.0)
Immature Granulocytes: 0 %
Lymphocytes Relative: 28 %
Lymphs Abs: 2.1 10*3/uL (ref 0.7–4.0)
MCH: 31.9 pg (ref 26.0–34.0)
MCHC: 32.2 g/dL (ref 30.0–36.0)
MCV: 98.9 fL (ref 80.0–100.0)
Monocytes Absolute: 0.7 10*3/uL (ref 0.1–1.0)
Monocytes Relative: 9 %
Neutro Abs: 4.3 10*3/uL (ref 1.7–7.7)
Neutrophils Relative %: 59 %
Platelets: 203 10*3/uL (ref 150–400)
RBC: 3.67 MIL/uL — ABNORMAL LOW (ref 3.87–5.11)
RDW: 12.9 % (ref 11.5–15.5)
WBC: 7.4 10*3/uL (ref 4.0–10.5)
nRBC: 0 % (ref 0.0–0.2)

## 2022-07-03 LAB — COMPREHENSIVE METABOLIC PANEL
ALT: 19 U/L (ref 0–44)
AST: 25 U/L (ref 15–41)
Albumin: 2.7 g/dL — ABNORMAL LOW (ref 3.5–5.0)
Alkaline Phosphatase: 90 U/L (ref 38–126)
Anion gap: 6 (ref 5–15)
BUN: 28 mg/dL — ABNORMAL HIGH (ref 8–23)
CO2: 23 mmol/L (ref 22–32)
Calcium: 8.1 mg/dL — ABNORMAL LOW (ref 8.9–10.3)
Chloride: 111 mmol/L (ref 98–111)
Creatinine, Ser: 1.24 mg/dL — ABNORMAL HIGH (ref 0.44–1.00)
GFR, Estimated: 44 mL/min — ABNORMAL LOW (ref >60)
Glucose, Bld: 113 mg/dL — ABNORMAL HIGH (ref 70–99)
Potassium: 3.8 mmol/L (ref 3.5–5.1)
Sodium: 140 mmol/L (ref 135–145)
Total Bilirubin: 0.6 mg/dL (ref 0.3–1.2)
Total Protein: 5.7 g/dL — ABNORMAL LOW (ref 6.5–8.1)

## 2022-07-03 LAB — GLUCOSE, CAPILLARY
Glucose-Capillary: 106 mg/dL — ABNORMAL HIGH (ref 70–99)
Glucose-Capillary: 74 mg/dL (ref 70–99)
Glucose-Capillary: 118 mg/dL — ABNORMAL HIGH (ref 70–99)

## 2022-07-03 LAB — MAGNESIUM: Magnesium: 2 mg/dL (ref 1.7–2.4)

## 2022-07-03 NOTE — Progress Notes (Signed)
Patient son at bedside discussed post discharge planning, son stated would like patient to go to [facility] SNF again if patient requires SNF at Discharge

## 2022-07-03 NOTE — Progress Notes (Signed)
Mobility Specialist Progress Note   07/03/22 1430  Mobility  Activity Transferred from chair to bed  Level of Assistance Minimal assist, patient does 75% or more  Assistive Device Front wheel walker  Distance Ambulated (ft) 6 ft  Activity Response Tolerated well  Mobility Referral Yes  $Mobility charge 1 Mobility   Pt requesting assistance to get from chair to bed d/t increasing discomfort. Required minA to stand but CGA for remainder of session. Pt requiring cues for sequencing d/t pt asking "why" throughout transfer. Pt able to get self in bed once seated, left in bed w/ bed alarm on and call bell in reach.  Frederico Hamman Mobility Specialist Please contact via SecureChat or  Rehab office at [phone]

## 2022-07-03 NOTE — [place] FL2 (Signed)
[place] MEDICAID FL2 LEVEL OF CARE FORM     IDENTIFICATION  Patient Name: Yvonne Bailey Birthdate: 1940-10-01 Sex: female Admission Date (Current Location): 06/29/2022  [facility] and [place] Number:  Producer, television/film/video and Address:  [facility] [place], [address] [place], [place] [address]      Provider Number: [id number]  Attending Physician Name and Address:  Almon Hercules, MD  Relative Name and Phone Number:  [name] [phone])  [phone] [facility])    Current Level of Care: SNF Recommended Level of Care: Skilled Nursing Facility Prior Approval Number:    Date Approved/Denied:   PASRR Number: [id number]  Discharge Plan: SNF    Current Diagnoses: Patient Active Problem List   Diagnosis Date Noted   Recurrent episodes of unresponsiveness 07/02/2022   Altered mental status 06/30/2022   TIA (transient ischemic attack) 06/29/2022   Moderate dementia 04/30/2022   UTI (urinary tract infection) 04/23/2022   Fall 03/17/2022   Hypomagnesemia 11/19/2021   Hypokalemia 11/17/2021   Catheter-associated urinary tract infection 09/03/2021   Acute metabolic encephalopathy 09/03/2021   Acute cystitis with hematuria 10/17/2020   Rash 08/29/2020   Anxiety and depression 03/28/2020   Overactive bladder 03/28/2020   Lack of motivation 12/21/2019   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   Aphasia as late effect of cerebrovascular accident (CVA)    Acute ischemic cerebrovascular accident (CVA) involving left middle cerebral artery territory    Coronary artery disease involving native coronary artery without angina pectoris    Stage 3 chronic kidney disease    Prediabetes 07/28/2016   DOE (dyspnea on exertion) 07/09/2016   Gait instability 07/09/2016   Cardiomyopathy, ischemic    Chronic systolic heart failure 08/21/2015   Bilateral leg edema 07/19/2015   Atrial fibrillation 07/19/2015   Urinary urgency 03/30/2015   Lacunar infarction 12/17/2012    Small vessel disease, cerebrovascular 12/17/2012   CAROTID BRUIT, RIGHT 08/10/2008   Peripheral vascular disease (HCC) 06/04/2007   Diverticulosis of large intestine 06/04/2007   Hypothyroidism 02/24/2007   Dyslipidemia 02/24/2007   Essential hypertension 02/24/2007   Acute thromboembolism of deep veins of lower extremity 01/21/2007    Orientation RESPIRATION BLADDER Height & Weight     Self  Normal Incontinent, External catheter Weight: 162 lb (73.5 kg) Height:   (170.2 cm)  BEHAVIORAL SYMPTOMS/MOOD NEUROLOGICAL BOWEL NUTRITION STATUS      Incontinent Diet (see d/c summary)  AMBULATORY STATUS COMMUNICATION OF NEEDS Skin   Extensive Assist Verbally Normal                       Personal Care Assistance Level of Assistance  Bathing, Feeding, Dressing Bathing Assistance: Maximum assistance Feeding assistance: Independent Dressing Assistance: Maximum assistance     Functional Limitations Info  Sight, Hearing, Speech Sight Info: Impaired Hearing Info: Impaired Speech Info: Adequate    SPECIAL CARE FACTORS FREQUENCY  OT (By licensed OT), PT (By licensed PT)     PT Frequency: 5x/week OT Frequency: 5x/week            Contractures Contractures Info: Not present    Additional Factors Info  Code Status, Allergies Code Status Info: full code Allergies Info: Definity (perflutren Lipid Microsphere), Pletal (cilostazol)           Current Medications (07/03/2022):  This is the current hospital active medication list Current Facility-Administered Medications  Medication Dose Route Frequency Provider Last Rate Last Admin   0.9 %  sodium chloride infusion  250 mL Intravenous PRN Reva Bores, MD       acetaminophen (TYLENOL) tablet 650 mg  650 mg Oral Q6H PRN Reva Bores, MD       Or   acetaminophen (TYLENOL) suppository 650 mg  650 mg Rectal Q6H PRN Reva Bores, MD       apixaban Everlene Balls) tablet 5 mg  5 mg Oral BID Rolly Salter, MD   5 mg at 07/03/22  1057   aspirin EC tablet 81 mg  81 mg Oral Daily Rolly Salter, MD   81 mg at 07/03/22 1057   dorzolamide-timolol (COSOPT) 2-0.5 % ophthalmic solution 1 drop  1 drop Both Eyes BID Reva Bores, MD   1 drop at 07/03/22 1057   latanoprost (XALATAN) 0.005 % ophthalmic solution 1 drop  1 drop Both Eyes QHS Reva Bores, MD   1 drop at 07/02/22 2226   ondansetron (ZOFRAN) tablet 4 mg  4 mg Oral Q6H PRN Reva Bores, MD       Or   ondansetron [facility]) injection 4 mg  4 mg Intravenous Q6H PRN Reva Bores, MD       sertraline (ZOLOFT) tablet 50 mg  50 mg Oral Daily Cinderella, Margaret A   50 mg at 07/03/22 1057   sodium chloride flush (NS) 0.9 % injection 3 mL  3 mL Intravenous Q12H Reva Bores, MD   3 mL at 07/03/22 1057   sodium chloride flush (NS) 0.9 % injection 3 mL  3 mL Intravenous PRN Reva Bores, MD   3 mL at 06/29/22 2153     Discharge Medications: Please see discharge summary for a list of discharge medications.  Relevant Imaging Results:  Relevant Lab Results:   Additional Information SSN: [id number]  Erin Sons, LCSW

## 2022-07-03 NOTE — Evaluation (Signed)
Speech Language Pathology Evaluation Patient Details Name: Yvonne Bailey MRN: [id number] DOB: 1941-01-20 Today's Date: 07/03/2022 Time: 4098-1191 SLP Time Calculation (min) (ACUTE ONLY): 21 min  Problem List:  Patient Active Problem List   Diagnosis Date Noted   Recurrent episodes of unresponsiveness 07/02/2022   Altered mental status 06/30/2022   TIA (transient ischemic attack) 06/29/2022   Moderate dementia 04/30/2022   UTI (urinary tract infection) 04/23/2022   Fall 03/17/2022   Hypomagnesemia 11/19/2021   Hypokalemia 11/17/2021   Catheter-associated urinary tract infection 09/03/2021   Acute metabolic encephalopathy 09/03/2021   Acute cystitis with hematuria 10/17/2020   Rash 08/29/2020   Anxiety and depression 03/28/2020   Overactive bladder 03/28/2020   Lack of motivation 12/21/2019   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   Aphasia as late effect of cerebrovascular accident (CVA)    Acute ischemic cerebrovascular accident (CVA) involving left middle cerebral artery territory    Coronary artery disease involving native coronary artery without angina pectoris    Stage 3 chronic kidney disease    Prediabetes 07/28/2016   DOE (dyspnea on exertion) 07/09/2016   Gait instability 07/09/2016   Cardiomyopathy, ischemic    Chronic systolic heart failure 08/21/2015   Bilateral leg edema 07/19/2015   Atrial fibrillation 07/19/2015   Urinary urgency 03/30/2015   Lacunar infarction 12/17/2012   Small vessel disease, cerebrovascular 12/17/2012   CAROTID BRUIT, RIGHT 08/10/2008   Peripheral vascular disease (HCC) 06/04/2007   Diverticulosis of large intestine 06/04/2007   Hypothyroidism 02/24/2007   Dyslipidemia 02/24/2007   Essential hypertension 02/24/2007   Acute thromboembolism of deep veins of lower extremity 01/21/2007   Past Medical History:  Past Medical History:  Diagnosis Date   Bilateral leg edema 07/19/2015   CAD (coronary artery disease)    a. anterior MI s/p  PCI in 1992. b. cath 08/2015 with severe three-vessel CAD turned down for CABG and underwent DESx5 to prox Cx/OM2/D1/oRCA/mRCA   Carotid artery disease    a. carotid duplex 03/2015 showed 1-39% BICA, normal subclavian arteries, chronically occluded left vertebral, f/u recommended only PRN.   DVT (deep venous thrombosis)    X1   History of nuclear stress test    a. Myoview 6/17: EF 20-25%, mid anteroseptal, apical anterior, apical septal, apical inferior, apical lateral and apical scar, no ischemia, intermediate risk   HTN (hypertension)    Hyperlipidemia    Hypokalemia    Hypothyroidism    Ischemic cardiomyopathy    a. Echo 6/17: EF 20-25%, apex appears akinetic, MAC, moderate MR, moderate LAE, mild RVE, trivial PI, PASP 47 mmHg (needs repeat with Definity contrast).  b. Limited echo with Definity contrast 7/17: EF 25-30%, moderate to severe LAE. c. Limited Echo 2018 showed EF 40-45%.   Lacunar infarction 12/17/2012   Dr Terrace Arabia, Neurology    Leukocytosis    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   Longstanding persistent atrial fibrillation    MI (myocardial infarction) 1992   PAD (peripheral artery disease)    Right SFA occlusion, severe disease left CFA and SFA   Prediabetes 07/28/2016   Stage 3 chronic kidney disease    Stroke    a. 02/2017 in setting of noncompliance with Eliquis   TIA (transient ischemic attack)    Tricuspid regurgitation    Past Surgical History:  Past Surgical History:  Procedure Laterality Date   APPENDECTOMY     at hysterectomy and USO for fibroids, Dr. Kizzie Bane   CARDIAC CATHETERIZATION  1992   Dr Charlies Constable   CARDIAC CATHETERIZATION  N/A 09/06/2015   Procedure: Right/Left Heart Cath and Coronary Angiography;  Surgeon: Kathleene Hazel, MD;  Location: [facility] INVASIVE CV LAB;  Service: Cardiovascular;  Laterality: N/A;   CARDIAC CATHETERIZATION N/A 09/07/2015   Procedure: Coronary Stent Intervention;  Surgeon: Kathleene Hazel, MD;  Location: [facility] INVASIVE  CV LAB;  Service: Cardiovascular;  Laterality: N/A;   COLONOSCOPY     negative; 2008, Dr. Lina Sar   fracture LLE     '94; pinned   IR ANGIO INTRA EXTRACRAN SEL COM CAROTID INNOMINATE BILAT MOD SED  03/02/2017   IR ANGIO INTRA EXTRACRAN SEL COM CAROTID INNOMINATE BILAT MOD SED  04/23/2018   IR ANGIO VERTEBRAL SEL SUBCLAVIAN INNOMINATE BILAT MOD SED  04/23/2018   IR ANGIO VERTEBRAL SEL VERTEBRAL UNI R MOD SED  03/02/2017   IR RADIOLOGIST EVAL & MGMT  04/28/2017   IR US GUIDE VASC ACCESS RIGHT  04/23/2018   RADIOLOGY WITH ANESTHESIA N/A 03/02/2017   Procedure: RADIOLOGY WITH ANESTHESIA;  Surgeon: Julieanne Cotton, MD;  Location: [facility] OR;  Service: Radiology;  Laterality: N/A;   TEE WITHOUT CARDIOVERSION N/A 11/02/2020   Procedure: TRANSESOPHAGEAL ECHOCARDIOGRAM (TEE);  Surgeon: Thurmon Fair, MD;  Location: [facility] ENDOSCOPY;  Service: Cardiovascular;  Laterality: N/A;   TONSILLECTOMY     TOTAL ABDOMINAL HYSTERECTOMY     & BSO for fibroids   HPI:  82 y.o. female presents to [facility] hospital on 06/28/2022 AMS and periods of reduced responsiveness and lethargy. Pt was discharged for similar symptoms on 4/18, though to be related to hypothyroidism. Routine EEG negative. CT head negative. LTM EEG negative.  PMHx: dementia, UTIs, CAD, s/p PCI, DVT, HTN, icm, lacunar infarct, MI, PAD, CKD3, stroke.   Assessment / Plan / Recommendation Clinical Impression  Pt presents with moderate-severe cognitive deficits.  Pt has a hx of cognitive impairment with baseline dementia.  She was assessed using the SLUMS and scored 13 or 30 suggestive of a significant neurocognitive impairment.  She demonstrated excellent recall for narrative story score 8 of 8 points, but other areas assessed reflected impairments, including word recall task.  Pt's clock drawing was disorganized with numbers placed outside of clock face circle.  Pt was unable to include the clock hands.  She felt that this task was harder for her today as she has  done it the past, but otherwise believes her performance to be fairly consistent with her baseline.  Pt expressed that her goal is to return to her day time activities at the senior center where she goes when her son is at work during the day.  Given baseline impairments and lack of acute findings, SLP will sign off.  Pt has no further ST needs at this time.    SLP Assessment  SLP Recommendation/Assessment: Patient does not need any further Speech Lanaguage Pathology Services SLP Visit Diagnosis: Cognitive communication deficit (R41.841)    Recommendations for follow up therapy are one component of a multi-disciplinary discharge planning process, led by the attending physician.  Recommendations may be updated based on patient status, additional functional criteria and insurance authorization.    Follow Up Recommendations  No SLP follow up    Assistance Recommended at Discharge  None  Functional Status Assessment Patient has not had a recent decline in their functional status  Frequency and Duration  (N/A)         SLP Evaluation Cognition  Overall Cognitive Status: History of cognitive impairments - at baseline Arousal/Alertness: Awake/alert Orientation Level: Oriented to person;Oriented to place;Oriented  to time Year:  (Could not recall) Month: April Day of Week: Correct Memory: Impaired Problem Solving: Impaired       Comprehension  Auditory Comprehension Commands: Within Functional Limits Visual Recognition/Discrimination Discrimination: Not tested Reading Comprehension Reading Status: Not tested    Expression Expression Primary Mode of Expression: Verbal Verbal Expression Naming: Impairment Divergent: 50-74% accurate   Oral / Motor  Motor Speech Overall Motor Speech: Appears within functional limits for tasks assessed Respiration: Within functional limits Phonation: Normal Resonance: Within functional limits Articulation: Within functional limitis Intelligibility:  Intelligible Motor Planning: Witnin functional limits Motor Speech Errors: Not applicable            Kerrie Pleasure, MA, CCC-SLP Acute Rehabilitation Services Office: [phone] 07/03/2022, 12:29 PM

## 2022-07-03 NOTE — Progress Notes (Signed)
PROGRESS NOTE  Yvonne Bailey ZOX:[id number] DOB: Jan 03, 1941   PCP: Pincus Sanes, MD  Patient is from: Home  DOA: 06/29/2022 LOS: 1  Chief complaints Chief Complaint  Patient presents with   Altered Mental Status     Brief Narrative / Interim history:  82 year old with proximal A-fib, HTN, CKD, dementia, hypothyroidism, coronary artery disease, history of PE on Eliquis who was recently admitted to the hospital and discharged approximately 1 day ago with a similar story.  The patient has episodes of not responding up to about 1 hour where she is not talking and seems increasingly lethargic.  She was observed night and returned to her normal mentation.  At that time she was found to have undertreated hypothyroidism and her Synthroid was increased.  She had another episode where she just stopped responding she would not talk or do anything.  Patient does have history of dementia, lives with her son and goes to a senior center daily and takes the bus to get there.  This seems so unlike her and so she was brought in for this.   Neurology was consulted.  LTM EEG negative at the time of the event which were observed in the hospital no bradycardia seen on the monitor.  Neurology felt that this could be parasomnia or behavioral issues and psychiatry was consulted, gave recommendations and signed off. Son reports that the patient had some word dizziness sensation with TV spinning around before the event twice.  Checking vestibular evaluation.   Subjective: Seen and examined earlier this morning.  No major events overnight of this morning.  No complaints.  She denies pain.  Objective: Vitals:   07/02/22 1950 07/02/22 2319 07/03/22 0329 07/03/22 0757  BP: (!) 145/55 (!) 124/57 (!) 136/56 137/64  Pulse: 64 60 (!) 57 (!) 56  Resp: Temp: 97.7 F (36.5 C) 98 F (36.7 C) 98.1 F (36.7 C) 97.6 F (36.4 C)  TempSrc: Oral Oral Oral Oral  SpO2: 100% 100% 100% 100%  Weight:       Height:        Examination:  GENERAL: No apparent distress.  Nontoxic. HEENT: MMM.  Vision and hearing grossly intact.  NECK: Supple.  No apparent JVD.  RESP:  No IWOB.  Fair aeration bilaterally. CVS:  RRR. Heart sounds normal.  ABD/GI/GU: BS+. Abd soft, NTND.  MSK/EXT:  Moves extremities. No apparent deformity. No edema.  SKIN: no apparent skin lesion or wound NEURO: Awake, alert and oriented x 4 except date and year.  Follows commands.  2+/5 in RLE.  3+/5 in LLE.  PSYCH: Calm. Normal affect.   Procedures:  None  Microbiology summarized: None  Assessment and plan: Principal Problem:   TIA (transient ischemic attack) Active Problems:   Hypothyroidism   Dyslipidemia   Essential hypertension   Peripheral vascular disease (HCC)   Acute thromboembolism of deep veins of lower extremity   Small vessel disease, cerebrovascular   Atrial fibrillation   Coronary artery disease involving native coronary artery without angina pectoris   Stage 3 chronic kidney disease   UTI (urinary tract infection)   Altered mental status   Recurrent episodes of unresponsiveness  Recurrent unresponsive episode: Recurrent episode on 4/17, 4/20, 4/22 and 4/23 unclear etiology.  CVA workup negative.  LTM EEG during the event negative.  No events on telemetry either.  Completed antibiotics for UTI.  Concern about parasomnia and behavioral issues.  Neurology recommended psychiatry consult.  Psychiatry saw patient and  gave recommendation and signed off.  Also concern about some vertigo.  Vestibular PT consulted. -Reorientation and delirium precautions -Appreciate psych input-treat hypothyroidism and increase Zoloft to home dose -Vestibular PT evaluation pending -Avoid or minimize sedating medications   Acute metabolic encephalopathy Dementia without behavioral disturbance: -Seems to have resolved.  She is currently awake and alert and oriented x 4 except date and year. -Reorientation and delirium  precautions  E faecalis UTI: Completed treatment course.  Paroxysmal A-fib: Intermittent bradycardia. -Continue home Eliquis.  History of PE -Continue Eliquis.   Essential hypertension/history of CAD: Stable -Continue home regimen.   CKD stage II: Stable. -Discontinue IV fluid   Mild hypomagnesemia. -Monitor replenish as appropriate  Hypothyroidism: TSH and free T4 within normal. -Continue Synthroid  History of depression: -Continue home Zoloft per psych.  Vertigo? -Vestibular PT eval  Generalized weakness/physical deconditioning -Therapy recommended SNF.   Body mass index is 25.37 kg/m.           DVT prophylaxis:   apixaban (ELIQUIS) tablet 5 mg  Code Status: Full code Family Communication: Attempted to call patient's son for update but no answer. Level of care: Telemetry Medical Status is: Inpatient Remains inpatient appropriate because: Intermittent brief unresponsiveness   Final disposition: SNF Consultants:  Neurology Psychiatry  35 minutes with more than 50% spent in reviewing records, counseling patient/family and coordinating care.   Sch Meds:  Scheduled Meds:  apixaban  5 mg Oral BID   aspirin EC  81 mg Oral Daily   dorzolamide-timolol  1 drop Both Eyes BID   latanoprost  1 drop Both Eyes QHS   sertraline  50 mg Oral Daily   sodium chloride flush  3 mL Intravenous Q12H   Continuous Infusions:  sodium chloride     PRN Meds:.sodium chloride, acetaminophen **OR** acetaminophen, ondansetron **OR** ondansetron (ZOFRAN) IV, sodium chloride flush  Antimicrobials: Anti-infectives (From admission, onward)    Start     Dose/Rate Route Frequency Ordered Stop   07/02/22 1700  ampicillin (OMNIPEN) 1 g in sodium chloride 0.9 % 100 mL IVPB        1 g 300 mL/hr over 20 Minutes Intravenous Every 8 hours 07/02/22 1112 07/03/22 0050   07/01/22 1530  ampicillin (OMNIPEN) 500 mg in sodium chloride 0.9 % 50 mL IVPB  Status:  Discontinued        500  mg 150 mL/hr over 20 Minutes Intravenous Every 8 hours 07/01/22 1438 07/02/22 1112   06/30/22 1400  amoxicillin (AMOXIL) capsule 500 mg  Status:  Discontinued        500 mg Oral Every 8 hours 06/30/22 1053 07/01/22 1438   06/29/22 2200  amoxicillin (AMOXIL) capsule 500 mg  Status:  Discontinued        500 mg Oral Every 12 hours 06/29/22 2047 06/30/22 1052        I have personally reviewed the following labs and images: CBC: Recent Labs  Lab 06/29/22 1637 07/01/22 0759 07/03/22 0348  WBC 9.4 7.0 7.4  NEUTROABS 6.7 4.3 4.3  HGB 13.3 13.1 11.7*  HCT 41.0 40.0 36.3  MCV 98.3 96.6 98.9  PLT 245 235 203   BMP &GFR Recent Labs  Lab 06/29/22 1637 07/01/22 0759 07/02/22 0743 07/03/22 0348  NA 139 140 138 140  K 4.1 4.1 4.0 3.8  CL 106 107 107 111  CO2 GLUCOSE 94 91 99 113*  BUN 18 23 30* 28*  CREATININE 1.15* 1.23* 1.21* 1.24*  CALCIUM 8.7*  8.9 8.4* 8.1*  MG  --  1.9 1.6* 2.0   Estimated Creatinine Clearance: 34.6 mL/min (A) (by C-G formula based on SCr of 1.24 mg/dL (H)). Liver & Pancreas: Recent Labs  Lab 06/29/22 1637 07/01/22 0759 07/03/22 0348  AST 29 27 25   ALT 20 19 19   ALKPHOS 102 94 90  BILITOT 0.8 0.7 0.6  PROT 6.8 6.7 5.7*  ALBUMIN 3.3* 3.2* 2.7*   No results for input(s): "LIPASE", "AMYLASE" in the last 168 hours. No results for input(s): "AMMONIA" in the last 168 hours. Diabetic: No results for input(s): "HGBA1C" in the last 72 hours. Recent Labs  Lab 07/02/22 0945 07/02/22 1142 07/02/22 1622 07/02/22 2326 07/03/22 0608  GLUCAP 92 96 106* 130* 106*   Cardiac Enzymes: No results for input(s): "CKTOTAL", "CKMB", "CKMBINDEX", "TROPONINI" in the last 168 hours. No results for input(s): "PROBNP" in the last 8760 hours. Coagulation Profile: No results for input(s): "INR", "PROTIME" in the last 168 hours. Thyroid Function Tests: No results for input(s): "TSH", "T4TOTAL", "FREET4", "T3FREE", "THYROIDAB" in the last 72 hours. Lipid  Profile: No results for input(s): "CHOL", "HDL", "LDLCALC", "TRIG", "CHOLHDL", "LDLDIRECT" in the last 72 hours. Anemia Panel: No results for input(s): "VITAMINB12", "FOLATE", "FERRITIN", "TIBC", "IRON", "RETICCTPCT" in the last 72 hours. Urine analysis:    Component Value Date/Time   COLORURINE YELLOW 06/29/2022 1351   APPEARANCEUR CLOUDY (A) 06/29/2022 1351   APPEARANCEUR Cloudy (A) 03/27/2017 1332   LABSPEC 1.041 (H) 06/29/2022 1351   PHURINE 6.0 06/29/2022 1351   GLUCOSEU NEGATIVE 06/29/2022 1351   GLUCOSEU NEGATIVE 05/28/2022 1359   HGBUR MODERATE (A) 06/29/2022 1351   HGBUR negative 10/26/2007 1057   BILIRUBINUR NEGATIVE 06/29/2022 1351   BILIRUBINUR negative 12/25/2021 1655   BILIRUBINUR Negative 03/27/2017 1332   KETONESUR NEGATIVE 06/29/2022 1351   PROTEINUR 30 (A) 06/29/2022 1351   UROBILINOGEN 0.2 05/28/2022 1359   NITRITE NEGATIVE 06/29/2022 1351   LEUKOCYTESUR LARGE (A) 06/29/2022 1351   Sepsis Labs: Invalid input(s): "PROCALCITONIN", "LACTICIDVEN"  Microbiology: Recent Results (from the past 240 hour(s))  Urine Culture     Status: Abnormal   Collection Time: 06/26/22 12:00 PM   Specimen: Urine, Clean Catch  Result Value Ref Range Status   Specimen Description URINE, CLEAN CATCH  Final   Special Requests   Final    NONE Performed at [facility], [address] [place], [place] [address]    Culture 60,000 COLONIES/mL ENTEROCOCCUS FAECALIS (A)  Final   Report Status 06/28/2022 FINAL  Final   Organism ID, Bacteria ENTEROCOCCUS FAECALIS (A)  Final      Susceptibility   Enterococcus faecalis - MIC*    AMPICILLIN <=2 SENSITIVE Sensitive     NITROFURANTOIN <=16 SENSITIVE Sensitive     VANCOMYCIN 1 SENSITIVE Sensitive     * 60,000 COLONIES/mL ENTEROCOCCUS FAECALIS    Radiology Studies: No results found.    Abcde Oneil T. Encarnacion Bole [facility]  If 7PM-7AM, please contact night-coverage [email or web address] 07/03/2022, 12:04 PM

## 2022-07-03 NOTE — Progress Notes (Addendum)
Addendum 11:56 AM - Notified by [facility] team that son voiced concern over pt's weakness and that she is typically in a day care program during the days and needs to be stronger to return to this. Thus, will continue to recommend short-term rehab, <3 hours/day, prior to return home instead.  Raymond Gurney, PT, DPT Acute Rehabilitation Services  Office: [phone]   Physical Therapy Treatment Patient Details Name: Yvonne Bailey MRN: [id number] DOB: 08-13-1940 Today's Date: 07/03/2022   History of Present Illness 82 y.o. female presents to [facility] hospital on 06/28/2022 AMS and periods of reduced responsiveness and lethargy. Pt was discharged for similar symptoms on 4/18, though to be related to hypothyroidism. PMHx: dementia, UTIs, CAD, s/p PCI, DVT, HTN, icm, lacunar infarct, MI, PAD, CKD3, stroke.    PT Comments    Pt was more awake and able to follow commands today. Pt denied any lightheadedness or dizziness with positional changes or any testing throughout the session. See Vestibular Assessment General Observation below. Pt required up to minA for all functional mobility, including ambulating in the room with a RW. Will continue to follow acutely.      Vestibular Assessment - 07/03/22 0001       Vestibular Assessment   General Observation Pt did not appear to have nystagmus and denied any symptoms of dizziness throughout the assessment and with mobility, but she did have saccades with attempts at smooth pursuit, VOR cancellation, or maintaining a gaze. This can be indicative of a central nervous system issue. Of note, pt does have hx of CVA in the right basal ganglia, right frontal  cortical and subcortical opercular region and left frontoparietal  cortical and subcortical brain, which could be related to her abnormal saccades with testing. She may benefit from exercises to improve her ocular movement and tracking.      Symptom Behavior   Subjective history of current problem Pt denied any  spinning/dizziness prior to and during session. Per chart, "Son reports that the patient had a sensation of room spinning around before she passed out twice."    Type of Dizziness  Spinning   per chart, pt currently denying any   Frequency of Dizziness unsure    Duration of Dizziness unsure    Symptom Ashby Dawes --   unsure   Aggravating Factors --   unsure   Relieving Factors --   unsure   History of similar episodes unknown      Oculomotor Exam   Oculomotor Alignment Normal    Ocular ROM WFL, but difficulty tracking far in all directions due to reports of hx of glaucoma impacting her peripheral vision bil    Spontaneous Comment   unable to keep eyes focused in one spot, often jumping around, like saccades, poor attention span   Gaze-induced  Absent;Comment   saccades noted   Smooth Pursuits Saccades   throughout   Saccades Poor trajectory;Slow      Oculomotor Exam-Fixation Suppressed    Left Head Impulse overall WFL   saccades mildly present, but present pretty much with all testing   Right Head Impulse overall WFL   saccades mildly present, but present pretty much with all testing     Vestibulo-Ocular Reflex   VOR Cancellation Unable to maintain gaze    Comment maintains normal gaze at times but other times loses attention and displays saccades      Visual Acuity   Static reports of hx of glaucoma impacting her peripheral vision bil  Positional Testing   Dix-Hallpike Dix-Hallpike Right;Dix-Hallpike Left    Horizontal Canal Testing Horizontal Canal Right;Horizontal Canal Left      Dix-Hallpike Right   Dix-Hallpike Right Duration negative    Dix-Hallpike Right Symptoms No nystagmus;Other (comment)   saccades     Dix-Hallpike Left   Dix-Hallpike Left Duration negative    Dix-Hallpike Left Symptoms No nystagmus;Other (comment)   saccades     Horizontal Canal Right   Horizontal Canal Right Duration negative    Horizontal Canal Right Symptoms Other (comment)   saccades      Horizontal Canal Left   Horizontal Canal Left Duration negative    Horizontal Canal Left Symptoms Other (comment)   saccades     Cognition   Cognition Comment hx of dementia               Recommendations for follow up therapy are one component of a multi-disciplinary discharge planning process, led by the attending physician.  Recommendations may be updated based on patient status, additional functional criteria and insurance authorization.  Follow Up Recommendations  Can patient physically be transported by private vehicle: Yes    Assistance Recommended at Discharge Frequent or constant Supervision/Assistance  Patient can return home with the following Assistance with cooking/housework;Direct supervision/assist for medications management;Direct supervision/assist for financial management;Assist for transportation;Help with stairs or ramp for entrance;A little help with walking and/or transfers;A little help with bathing/dressing/bathroom   Equipment Recommendations  Hospital bed;Wheelchair (measurements PT)    Recommendations for Other Services       Precautions / Restrictions Precautions Precautions: Fall Restrictions Weight Bearing Restrictions: No     Mobility  Bed Mobility Overal bed mobility: Needs Assistance Bed Mobility: Supine to Sit     Supine to sit: HOB elevated, Min assist     General bed mobility comments: L HHA minA for pt to pull up to sit up L EOB with R hand on R bed rail, HOB elevated    Transfers Overall transfer level: Needs assistance Equipment used: Rolling walker (2 wheels) Transfers: Sit to/from Stand Sit to Stand: Min assist           General transfer comment: Pt needs cues for hand placement with transfers, needing light minA to power up to stand and steady, 1x from EOB and 1x from chair    Ambulation/Gait Ambulation/Gait assistance: Min assist, Min guard Gait Distance (Feet): 27 Feet (x2 bouts of ~10 ft > ~27 ft) Assistive  device: Rolling walker (2 wheels) Gait Pattern/deviations: Step-through pattern, Decreased stride length, Trunk flexed Gait velocity: reduced Gait velocity interpretation: <1.31 ft/sec, indicative of household ambulator   General Gait Details: Pt takes slow, small steps with a very flexed posture. Pt needs cues or extra time to problem solve how to go around obstacles, liekly due to reported peripheral vision deficits from glaucoma. Min guard-minA for stability   Stairs             Wheelchair Mobility    Modified Rankin (Stroke Patients Only) Modified Rankin (Stroke Patients Only) Pre-Morbid Rankin Score: Moderately severe disability Modified Rankin: Moderately severe disability     Balance Overall balance assessment: Needs assistance Sitting-balance support: No upper extremity supported, Feet supported Sitting balance-Leahy Scale: Zero Sitting balance - Comments: Min guard-supervision to sit unsupported statically   Standing balance support: Bilateral upper extremity supported, No upper extremity supported, Single extremity supported, During functional activity Standing balance-Leahy Scale: Poor Standing balance comment: Reliant on minA for balance when wihtout UE support, reliant on  at least 1 UE support for dynamic standing tasks                            Cognition Arousal/Alertness: Awake/alert Behavior During Therapy: WFL for tasks assessed/performed Overall Cognitive Status: History of cognitive impairments - at baseline                                 General Comments: Pt needed encouragement to participate initially, but pleasant throughout. Pt easily distracted and has difficulty describing more complex questions. Hx of dementia, likely close to baseline.        Exercises      General Comments General comments (skin integrity, edema, etc.): denied lightheadedness with positional changes      Pertinent Vitals/Pain Pain  Assessment Pain Assessment: Faces Faces Pain Scale: No hurt Pain Intervention(s): Monitored during session    Home Living                          Prior Function            PT Goals (current goals can now be found in the care plan section) Acute Rehab PT Goals Patient Stated Goal: to go home PT Goal Formulation: With patient Time For Goal Achievement: 07/15/22 Potential to Achieve Goals: Fair Progress towards PT goals: Progressing toward goals    Frequency    Min 3X/week      PT Plan Discharge plan needs to be updated;Frequency needs to be updated;Equipment recommendations need to be updated    Co-evaluation              AM-PAC PT "6 Clicks" Mobility   Outcome Measure  Help needed turning from your back to your side while in a flat bed without using bedrails?: A Little Help needed moving from lying on your back to sitting on the side of a flat bed without using bedrails?: A Little Help needed moving to and from a bed to a chair (including a wheelchair)?: A Little Help needed standing up from a chair using your arms (e.g., wheelchair or bedside chair)?: A Little Help needed to walk in hospital room?: A Little Help needed climbing 3-5 steps with a railing? : A Lot 6 Click Score: 17    End of Session Equipment Utilized During Treatment: Gait belt Activity Tolerance: Patient tolerated treatment well Patient left: with call bell/phone within reach;in chair;with chair alarm set   PT Visit Diagnosis: Other abnormalities of gait and mobility (R26.89);Other symptoms and signs involving the nervous system (R29.898);Unsteadiness on feet (R26.81);Muscle weakness (generalized) (M62.81)     Time: 9147-8295 PT Time Calculation (min) (ACUTE ONLY): 37 min  Charges:  $Therapeutic Activity: 23-37 mins                     Raymond Gurney, PT, DPT Acute Rehabilitation Services  Office: [phone]    Jewel Baize 07/03/2022, 9:57 AM

## 2022-07-03 NOTE — Plan of Care (Signed)

## 2022-07-04 DIAGNOSIS — N3000 Acute cystitis without hematuria: Secondary | ICD-10-CM | POA: Diagnosis not present

## 2022-07-04 DIAGNOSIS — I482 Chronic atrial fibrillation, unspecified: Secondary | ICD-10-CM | POA: Diagnosis not present

## 2022-07-04 DIAGNOSIS — R4182 Altered mental status, unspecified: Secondary | ICD-10-CM | POA: Diagnosis not present

## 2022-07-04 DIAGNOSIS — G459 Transient cerebral ischemic attack, unspecified: Secondary | ICD-10-CM | POA: Diagnosis not present

## 2022-07-04 LAB — MAGNESIUM: Magnesium: 1.8 mg/dL (ref 1.7–2.4)

## 2022-07-04 LAB — CBC
HCT: 36.2 % (ref 36.0–46.0)
Hemoglobin: 12.3 g/dL (ref 12.0–15.0)
MCH: 32.6 pg (ref 26.0–34.0)
MCHC: 34 g/dL (ref 30.0–36.0)
MCV: 96 fL (ref 80.0–100.0)
Platelets: 208 10*3/uL (ref 150–400)
RBC: 3.77 MIL/uL — ABNORMAL LOW (ref 3.87–5.11)
RDW: 13.1 % (ref 11.5–15.5)
WBC: 8.1 10*3/uL (ref 4.0–10.5)
nRBC: 0 % (ref 0.0–0.2)

## 2022-07-04 LAB — GLUCOSE, CAPILLARY
Glucose-Capillary: 112 mg/dL — ABNORMAL HIGH (ref 70–99)
Glucose-Capillary: 127 mg/dL — ABNORMAL HIGH (ref 70–99)
Glucose-Capillary: 82 mg/dL (ref 70–99)
Glucose-Capillary: 85 mg/dL (ref 70–99)

## 2022-07-04 LAB — RENAL FUNCTION PANEL
Albumin: 3 g/dL — ABNORMAL LOW (ref 3.5–5.0)
Anion gap: 7 (ref 5–15)
BUN: 23 mg/dL (ref 8–23)
CO2: 24 mmol/L (ref 22–32)
Calcium: 8.5 mg/dL — ABNORMAL LOW (ref 8.9–10.3)
Chloride: 108 mmol/L (ref 98–111)
Creatinine, Ser: 1.09 mg/dL — ABNORMAL HIGH (ref 0.44–1.00)
GFR, Estimated: 51 mL/min — ABNORMAL LOW (ref >60)
Glucose, Bld: 90 mg/dL (ref 70–99)
Phosphorus: 3.4 mg/dL (ref 2.5–4.6)
Potassium: 4 mmol/L (ref 3.5–5.1)
Sodium: 139 mmol/L (ref 135–145)

## 2022-07-04 MED ORDER — ACETAMINOPHEN 325 MG PO TABS: 650.0000 mg | ORAL_TABLET | Freq: Four times a day (QID) | ORAL | 2 refills | Status: DC | PRN

## 2022-07-04 MED ORDER — LEVOTHYROXINE SODIUM 112 MCG PO TABS
112.0000 ug | ORAL_TABLET | Freq: Every day | ORAL | Status: DC
  Administered 2022-07-04 – 2022-07-05 (×2): 112 ug via ORAL
  Filled 2022-07-04 (×2): qty 1

## 2022-07-04 MED ORDER — SERTRALINE HCL 50 MG PO TABS: 50.0000 mg | ORAL_TABLET | Freq: Every day | ORAL | Status: DC

## 2022-07-04 MED ORDER — ATORVASTATIN CALCIUM 40 MG PO TABS: 40.0000 mg | ORAL_TABLET | Freq: Every day | ORAL | 3 refills | Status: DC

## 2022-07-04 NOTE — Progress Notes (Signed)
PROGRESS NOTE  Yvonne Bailey UEA:[id number] DOB: 11/26/1940   PCP: Pincus Sanes, MD  Patient is from: Home  DOA: 06/29/2022 LOS: 2  Chief complaints Chief Complaint  Patient presents with   Altered Mental Status     Brief Narrative / Interim history:  82 year old with proximal A-fib, HTN, CKD, dementia, hypothyroidism, coronary artery disease, history of PE on Eliquis who was recently admitted to the hospital and discharged approximately 1 day ago with a similar story.  The patient has episodes of not responding up to about 1 hour where she is not talking and seems increasingly lethargic.  She was observed night and returned to her normal mentation.  At that time she was found to have undertreated hypothyroidism and her Synthroid was increased.  She had another episode where she just stopped responding she would not talk or do anything.  Patient does have history of dementia, lives with her son and goes to a senior center daily and takes the bus to get there.  This seems so unlike her and so she was brought in for this.   Neurology was consulted.  LTM EEG negative at the time of the event which were observed in the hospital no bradycardia seen on the monitor.  Neurology felt that this could be parasomnia or behavioral issues and psychiatry was consulted, gave recommendations and signed off.  Medically ready for discharge to SNF  Subjective: Seen and examined earlier this morning.  No major events overnight of this morning.  No complaints.  Objective: Vitals:   07/03/22 2357 07/04/22 0327 07/04/22 0813 07/04/22 1123  BP: 127/63 (!) 141/69 (!) 151/69 122/60  Pulse: 64 (!) 55 68 65  Resp: 18 18 18 18   Temp: (!) 97.5 F (36.4 C) 98 F (36.7 C) 98.1 F (36.7 C) 97.7 F (36.5 C)  TempSrc: Oral Oral Axillary Axillary  SpO2: 100% 100% 100% 100%  Weight:      Height:        Examination:  GENERAL: No apparent distress.  Nontoxic. HEENT: MMM.  Missing dentition.  Vision and  hearing grossly intact.  NECK: Supple.  No apparent JVD.  RESP:  No IWOB.  Fair aeration bilaterally. CVS:  RRR. Heart sounds normal.  ABD/GI/GU: BS+. Abd soft, NTND.  MSK/EXT:   No apparent deformity. Moves extremities. No edema.  SKIN: no apparent skin lesion or wound NEURO: Awake and alert. Oriented x 4 except date and year.  Follows commands.  No apparent focal neuro deficit. PSYCH: Calm. Normal affect.   Procedures:  None  Microbiology summarized: None  Assessment and plan: Principal Problem:   TIA (transient ischemic attack) Active Problems:   Hypothyroidism   Dyslipidemia   Essential hypertension   Peripheral vascular disease (HCC)   Acute thromboembolism of deep veins of lower extremity   Small vessel disease, cerebrovascular   Atrial fibrillation   Coronary artery disease involving native coronary artery without angina pectoris   Stage 3 chronic kidney disease   UTI (urinary tract infection)   Altered mental status   Recurrent episodes of unresponsiveness  Recurrent unresponsive episode: Recurrent episode on 4/17, 4/20, 4/22 and 4/23 unclear etiology.  CVA workup negative.  LTM EEG during the event negative.  No events on telemetry either.  Completed antibiotics for UTI.  Concern about parasomnia and behavioral issues.  Neurology recommended psychiatry consult.  Psychiatry saw patient and gave recommendation and signed off.  -Reorientation and delirium precautions -Appreciate psych input-treat hypothyroidism and resume Zoloft at 50 mg  daily (half home dose) -Avoid or minimize sedating medications   Acute metabolic encephalopathy:  Dementia without behavioral disturbance: -Seems to have resolved.  She is currently awake and alert and oriented x 4 except date and year. -Reorientation and delirium precautions  E faecalis UTI: Completed treatment course.  Paroxysmal A-fib: Intermittent bradycardia. -Continue home Eliquis.  History of PE -Continue Eliquis.    Essential hypertension/history of CAD: Stable -Continue home regimen.   CKD-3A: Stable. Recent Labs    04/25/22 0537 04/26/22 0612 04/27/22 0350 05/29/22 1004 06/26/22 1025 06/29/22 1637 07/01/22 0759 07/02/22 0743 07/03/22 0348 07/04/22 0606  BUN 30* 28* 23  CREATININE 1.01* 1.02* 1.08* 1.08* 1.11* 1.15* 1.23* 1.21* 1.24* 1.09*  -Monitor intermittently  Mild hypomagnesemia. -Monitor replenish as appropriate  Hypothyroidism: TSH and free T4 within normal. -Continue Synthroid  History of depression: -Psychiatry recommendation-Continue Zoloft 50 mg daily. May increase to 100 mg in 2 to 3 weeks as needed  Vertigo?  Patient's son reported dizziness sensation with TV spinning around but dizziness or vertigo during the evaluation by vestibular PT.  Generalized weakness/physical deconditioning -Therapy recommended SNF.   Body mass index is 25.37 kg/m.           DVT prophylaxis:   apixaban (ELIQUIS) tablet 5 mg  Code Status: Full code Family Communication: Updated patient's son over the phone. Level of care: Telemetry Medical Status is: Inpatient Remains inpatient appropriate because: SNF   Final disposition: SNF Consultants:  Neurology Psychiatry  35 minutes with more than 50% spent in reviewing records, counseling patient/family and coordinating care.   Sch Meds:  Scheduled Meds:  apixaban  5 mg Oral BID   aspirin EC  81 mg Oral Daily   dorzolamide-timolol  1 drop Both Eyes BID   latanoprost  1 drop Both Eyes QHS   levothyroxine  112 mcg Oral Q0600   sertraline  50 mg Oral Daily   sodium chloride flush  3 mL Intravenous Q12H   Continuous Infusions:  sodium chloride     PRN Meds:.sodium chloride, acetaminophen **OR** acetaminophen, ondansetron **OR** ondansetron (ZOFRAN) IV, sodium chloride flush  Antimicrobials: Anti-infectives (From admission, onward)    Start     Dose/Rate Route Frequency Ordered Stop   07/02/22 1700   ampicillin (OMNIPEN) 1 g in sodium chloride 0.9 % 100 mL IVPB        1 g 300 mL/hr over 20 Minutes Intravenous Every 8 hours 07/02/22 1112 07/03/22 0050   07/01/22 1530  ampicillin (OMNIPEN) 500 mg in sodium chloride 0.9 % 50 mL IVPB  Status:  Discontinued        500 mg 150 mL/hr over 20 Minutes Intravenous Every 8 hours 07/01/22 1438 07/02/22 1112   06/30/22 1400  amoxicillin (AMOXIL) capsule 500 mg  Status:  Discontinued        500 mg Oral Every 8 hours 06/30/22 1053 07/01/22 1438   06/29/22 2200  amoxicillin (AMOXIL) capsule 500 mg  Status:  Discontinued        500 mg Oral Every 12 hours 06/29/22 2047 06/30/22 1052        I have personally reviewed the following labs and images: CBC: Recent Labs  Lab 06/29/22 1637 07/01/22 0759 07/03/22 0348 07/04/22 0606  WBC 9.4 7.0 7.4 8.1  NEUTROABS 6.7 4.3 4.3  --   HGB 13.3 13.1 11.7* 12.3  HCT 41.0 40.0 36.3 36.2  MCV 98.3 96.6 98.9 96.0  PLT 245 235 203 208  BMP &GFR Recent Labs  Lab 06/29/22 1637 07/01/22 0759 07/02/22 0743 07/03/22 0348 07/04/22 0606  NA 139 140 138 140 139  K 4.1 4.1 4.0 3.8 4.0  CL 106 107 107 111 108  CO2 25 27 22 23 24   GLUCOSE 94 91 99 113* 90  BUN 18 23 30* 28* 23  CREATININE 1.15* 1.23* 1.21* 1.24* 1.09*  CALCIUM 8.7* 8.9 8.4* 8.1* 8.5*  MG  --  1.9 1.6* 2.0 1.8  PHOS  --   --   --   --  3.4   Estimated Creatinine Clearance: 39.4 mL/min (A) (by C-G formula based on SCr of 1.09 mg/dL (H)). Liver & Pancreas: Recent Labs  Lab 06/29/22 1637 07/01/22 0759 07/03/22 0348 07/04/22 0606  AST 29 27 25   --   ALT 20 19 19   --   ALKPHOS 102 94 90  --   BILITOT 0.8 0.7 0.6  --   PROT 6.8 6.7 5.7*  --   ALBUMIN 3.3* 3.2* 2.7* 3.0*   No results for input(s): "LIPASE", "AMYLASE" in the last 168 hours. No results for input(s): "AMMONIA" in the last 168 hours. Diabetic: No results for input(s): "HGBA1C" in the last 72 hours. Recent Labs  Lab 07/03/22 1225 07/03/22 1812 07/04/22 0001  07/04/22 0618 07/04/22 1130  GLUCAP 74 118* 127* 82 85   Cardiac Enzymes: No results for input(s): "CKTOTAL", "CKMB", "CKMBINDEX", "TROPONINI" in the last 168 hours. No results for input(s): "PROBNP" in the last 8760 hours. Coagulation Profile: No results for input(s): "INR", "PROTIME" in the last 168 hours. Thyroid Function Tests: No results for input(s): "TSH", "T4TOTAL", "FREET4", "T3FREE", "THYROIDAB" in the last 72 hours. Lipid Profile: No results for input(s): "CHOL", "HDL", "LDLCALC", "TRIG", "CHOLHDL", "LDLDIRECT" in the last 72 hours. Anemia Panel: No results for input(s): "VITAMINB12", "FOLATE", "FERRITIN", "TIBC", "IRON", "RETICCTPCT" in the last 72 hours. Urine analysis:    Component Value Date/Time   COLORURINE YELLOW 06/29/2022 1351   APPEARANCEUR CLOUDY (A) 06/29/2022 1351   APPEARANCEUR Cloudy (A) 03/27/2017 1332   LABSPEC 1.041 (H) 06/29/2022 1351   PHURINE 6.0 06/29/2022 1351   GLUCOSEU NEGATIVE 06/29/2022 1351   GLUCOSEU NEGATIVE 05/28/2022 1359   HGBUR MODERATE (A) 06/29/2022 1351   HGBUR negative 10/26/2007 1057   BILIRUBINUR NEGATIVE 06/29/2022 1351   BILIRUBINUR negative 12/25/2021 1655   BILIRUBINUR Negative 03/27/2017 1332   KETONESUR NEGATIVE 06/29/2022 1351   PROTEINUR 30 (A) 06/29/2022 1351   UROBILINOGEN 0.2 05/28/2022 1359   NITRITE NEGATIVE 06/29/2022 1351   LEUKOCYTESUR LARGE (A) 06/29/2022 1351   Sepsis Labs: Invalid input(s): "PROCALCITONIN", "LACTICIDVEN"  Microbiology: Recent Results (from the past 240 hour(s))  Urine Culture     Status: Abnormal   Collection Time: 06/26/22 12:00 PM   Specimen: Urine, Clean Catch  Result Value Ref Range Status   Specimen Description URINE, CLEAN CATCH  Final   Special Requests   Final    NONE Performed at [facility], [address], [place], [place] [address]    Culture 60,000 COLONIES/mL ENTEROCOCCUS FAECALIS (A)  Final   Report Status 06/28/2022 FINAL  Final   Organism ID, Bacteria  ENTEROCOCCUS FAECALIS (A)  Final      Susceptibility   Enterococcus faecalis - MIC*    AMPICILLIN <=2 SENSITIVE Sensitive     NITROFURANTOIN <=16 SENSITIVE Sensitive     VANCOMYCIN 1 SENSITIVE Sensitive     * 60,000 COLONIES/mL ENTEROCOCCUS FAECALIS    Radiology Studies: No results found.  Chanon Loney T. Lavenia Stumpo [facility]  If 7PM-7AM, please contact night-coverage [email or web address] 07/04/2022, 12:53 PM

## 2022-07-04 NOTE — Progress Notes (Signed)
Mobility Specialist Progress Note   07/04/22 1715  Mobility  Activity Ambulated with assistance in room;Ambulated with assistance to bathroom  Level of Assistance Minimal assist, patient does 75% or more  Assistive Device Front wheel walker  Distance Ambulated (ft) 36 ft  Activity Response Tolerated well  Mobility Referral Yes  $Mobility charge 1 Mobility   Pt requesting to go back to bed but agreeable to ambulation beforehand. Pt standing w/ minA d/t slight posterior lean but able to stabilize self when upright. Ambulated to doorway until pt had urge for BM, transferred to BR but pt incontinent during transferred. Once placed on toilet successful BM. MinA for pericare afterwards then returned back to bed d/t fatigue from standing for ~39mins. Left supine in bed w/ call bell in reach and bed alarm on.   Frederico Hamman Mobility Specialist Please contact via SecureChat or  Rehab office at [phone]

## 2022-07-04 NOTE — Progress Notes (Addendum)
Provided pt's son with current SNF offers and he has accepted [place]. Requested CMA begin [place] and notified Kitty in [place] admissions of acceptance. Will provided updates as available.   UPDATE 1430: [employer] auth received: [id number], valid 4/25-4/29. Per Georgeann Oppenheim, pt is in copay days and she needs to verify with her business office they won't bill copays up front.     Dellie Burns, MSW, LCSW [phone] (coverage)

## 2022-07-04 NOTE — Progress Notes (Signed)
Occupational Therapy Treatment Patient Details Name: Yvonne Bailey MRN: [id number] Today's Date: 07/04/2022   History of present illness 82 y.o. female presents to [facility] hospital on 06/28/2022 AMS and periods of reduced responsiveness and lethargy. Pt was discharged for similar symptoms on 4/18, though to be related to hypothyroidism.Routine EEG negative. CT head negative. LTM EEG negative. PMHx: dementia, UTIs, CAD, s/p PCI, DVT, HTN, icm, lacunar infarct, MI, PAD, CKD3, stroke.   OT comments  Patient pleasant and cooperative today.  Looses attention frequently but easily redirected, oriented.  She is incontinent of bowel and requires total assist for hygiene, min assist to change gown.  Transfers with min assist to min guard, poor carryover of techniques and mild posterior lean upon ascending.  Pt in recliner with granddaughter at side upon exit.  Will follow acutely, updated goals today as pt met 3/3.    Recommendations for follow up therapy are one component of a multi-disciplinary discharge planning process, led by the attending physician.  Recommendations may be updated based on patient status, additional functional criteria and insurance authorization.    Assistance Recommended at Discharge Frequent or constant Supervision/Assistance  Patient can return home with the following  Assistance with cooking/housework;Direct supervision/assist for medications management;Direct supervision/assist for financial management;Assist for transportation;Help with stairs or ramp for entrance;Two people to help with walking and/or transfers;Two people to help with bathing/dressing/bathroom;Assistance with feeding   Equipment Recommendations  Other (comment) (defer)    Recommendations for Other Services      Precautions / Restrictions Precautions Precautions: Fall Restrictions Weight Bearing Restrictions: No       Mobility Bed Mobility Overal bed mobility: Needs Assistance Bed  Mobility: Supine to Sit     Supine to sit: Min assist, HOB elevated     General bed mobility comments: trunk support to come to sitting, increased time but able to scoot self towards EOB    Transfers Overall transfer level: Needs assistance Equipment used: Rolling walker (2 wheels) Transfers: Sit to/from Stand Sit to Stand: Min assist           General transfer comment: cueing for hand placement and safety, min assist to min guard to power up and steady     Balance Overall balance assessment: Needs assistance Sitting-balance support: No upper extremity supported, Feet supported Sitting balance-Leahy Scale: Fair Sitting balance - Comments: min guard to close supervision statically   Standing balance support: Bilateral upper extremity supported, No upper extremity supported, During functional activity Standing balance-Leahy Scale: Poor Standing balance comment: relies on RW and external support                           ADL either performed or assessed with clinical judgement   ADL Overall ADL's : Needs assistance/impaired     Grooming: Minimal assistance;Wash/dry hands;Standing           Upper Body Dressing : Minimal assistance;Sitting Upper Body Dressing Details (indicate cue type and reason): don new gown Lower Body Dressing: Sit to/from stand;Moderate assistance   Toilet Transfer: Minimal assistance;Ambulation;Rolling walker (2 wheels)   Toileting- Clothing Manipulation and Hygiene: Total assistance;Sit to/from stand Toileting - Clothing Manipulation Details (indicate cue type and reason): incontinent BM     Functional mobility during ADLs: Minimal assistance;Rolling walker (2 wheels)      Extremity/Trunk Assessment Upper Extremity Assessment Upper Extremity Assessment: Generalized weakness            Vision  Perception     Praxis      Cognition Arousal/Alertness: Awake/alert Behavior During Therapy: WFL for tasks  assessed/performed Overall Cognitive Status: History of cognitive impairments - at baseline                                 General Comments: Pleasant and cooperative, easily distracted and requires redirection.        Exercises      Shoulder Instructions       General Comments granddaughter present and supportive    Pertinent Vitals/ Pain       Pain Assessment Pain Assessment: Faces Faces Pain Scale: No hurt Pain Intervention(s): Monitored during session  Home Living                                          Prior Functioning/Environment              Frequency  Min 2X/week        Progress Toward Goals  OT Goals(current goals can now be found in the care plan section)  Progress towards OT goals: Goals met and updated - see care plan  ADL Goals Pt Will Perform Grooming: with supervision;standing Pt Will Perform Lower Body Dressing: with min assist;sit to/from stand Pt Will Transfer to Toilet: with supervision;ambulating Additional ADL Goal #1: Pt will follow 2 step commands with 90% accuracy. Additional ADL Goal #2: Pt will complete bed mobility with supervision.  Plan Discharge plan remains appropriate;Frequency remains appropriate    Co-evaluation                 AM-PAC OT "6 Clicks" Daily Activity     Outcome Measure   Help from another person eating meals?: A Little Help from another person taking care of personal grooming?: A Little Help from another person toileting, which includes using toliet, bedpan, or urinal?: Total Help from another person bathing (including washing, rinsing, drying)?: A Little Help from another person to put on and taking off regular upper body clothing?: A Little Help from another person to put on and taking off regular lower body clothing?: A Lot 6 Click Score: 15    End of Session Equipment Utilized During Treatment: Rolling walker (2 wheels)  OT Visit Diagnosis: Other  abnormalities of gait and mobility (R26.89);Muscle weakness (generalized) (M62.81);Other symptoms and signs involving cognitive function   Activity Tolerance Patient tolerated treatment well   Patient Left in chair;with call bell/phone within reach;with chair alarm set;with family/visitor present   Nurse Communication Mobility status;Other (comment) (+ incontinent BM)        Time: 4782-9562 OT Time Calculation (min): 27 min  Charges: OT General Charges $OT Visit: 1 Visit OT Treatments $Self Care/Home Management : 23-37 mins  Barry Brunner, OT Acute Rehabilitation Services Office [phone]   Chancy Milroy 07/04/2022, 2:14 PM

## 2022-07-05 DIAGNOSIS — N3000 Acute cystitis without hematuria: Secondary | ICD-10-CM | POA: Diagnosis not present

## 2022-07-05 DIAGNOSIS — Z719 Counseling, unspecified: Secondary | ICD-10-CM | POA: Diagnosis not present

## 2022-07-05 DIAGNOSIS — R2681 Unsteadiness on feet: Secondary | ICD-10-CM | POA: Diagnosis not present

## 2022-07-05 DIAGNOSIS — G629 Polyneuropathy, unspecified: Secondary | ICD-10-CM | POA: Diagnosis not present

## 2022-07-05 DIAGNOSIS — F0390 Unspecified dementia without behavioral disturbance: Secondary | ICD-10-CM | POA: Diagnosis not present

## 2022-07-05 DIAGNOSIS — I482 Chronic atrial fibrillation, unspecified: Secondary | ICD-10-CM | POA: Diagnosis not present

## 2022-07-05 DIAGNOSIS — N390 Urinary tract infection, site not specified: Secondary | ICD-10-CM | POA: Diagnosis not present

## 2022-07-05 DIAGNOSIS — A419 Sepsis, unspecified organism: Secondary | ICD-10-CM | POA: Diagnosis not present

## 2022-07-05 DIAGNOSIS — I739 Peripheral vascular disease, unspecified: Secondary | ICD-10-CM | POA: Diagnosis not present

## 2022-07-05 DIAGNOSIS — I4811 Longstanding persistent atrial fibrillation: Secondary | ICD-10-CM | POA: Diagnosis not present

## 2022-07-05 DIAGNOSIS — I69320 Aphasia following cerebral infarction: Secondary | ICD-10-CM | POA: Diagnosis not present

## 2022-07-05 DIAGNOSIS — I5022 Chronic systolic (congestive) heart failure: Secondary | ICD-10-CM | POA: Diagnosis not present

## 2022-07-05 DIAGNOSIS — I82409 Acute embolism and thrombosis of unspecified deep veins of unspecified lower extremity: Secondary | ICD-10-CM

## 2022-07-05 DIAGNOSIS — R1311 Dysphagia, oral phase: Secondary | ICD-10-CM | POA: Diagnosis not present

## 2022-07-05 DIAGNOSIS — N1831 Chronic kidney disease, stage 3a: Secondary | ICD-10-CM

## 2022-07-05 DIAGNOSIS — R2689 Other abnormalities of gait and mobility: Secondary | ICD-10-CM | POA: Diagnosis not present

## 2022-07-05 DIAGNOSIS — Z7401 Bed confinement status: Secondary | ICD-10-CM | POA: Diagnosis not present

## 2022-07-05 DIAGNOSIS — M6281 Muscle weakness (generalized): Secondary | ICD-10-CM | POA: Diagnosis not present

## 2022-07-05 DIAGNOSIS — E039 Hypothyroidism, unspecified: Secondary | ICD-10-CM | POA: Diagnosis not present

## 2022-07-05 DIAGNOSIS — R41841 Cognitive communication deficit: Secondary | ICD-10-CM | POA: Diagnosis not present

## 2022-07-05 DIAGNOSIS — R4182 Altered mental status, unspecified: Secondary | ICD-10-CM | POA: Diagnosis not present

## 2022-07-05 DIAGNOSIS — I2510 Atherosclerotic heart disease of native coronary artery without angina pectoris: Secondary | ICD-10-CM | POA: Diagnosis not present

## 2022-07-05 DIAGNOSIS — I10 Essential (primary) hypertension: Secondary | ICD-10-CM | POA: Diagnosis not present

## 2022-07-05 DIAGNOSIS — G459 Transient cerebral ischemic attack, unspecified: Secondary | ICD-10-CM | POA: Diagnosis not present

## 2022-07-05 DIAGNOSIS — I63512 Cerebral infarction due to unspecified occlusion or stenosis of left middle cerebral artery: Secondary | ICD-10-CM | POA: Diagnosis not present

## 2022-07-05 LAB — GLUCOSE, CAPILLARY: Glucose-Capillary: 92 mg/dL (ref 70–99)

## 2022-07-05 MED ORDER — ZIPRASIDONE MESYLATE 20 MG IM SOLR
10.0000 mg | Freq: Four times a day (QID) | INTRAMUSCULAR | Status: DC | PRN
  Filled 2022-07-05: qty 20

## 2022-07-05 NOTE — TOC Transition Note (Signed)
Transition of Care [facility]) - CM/SW Discharge Note   Patient Details  Name: Yvonne Bailey MRN: [id number] Date of Birth: [birth year]  [facility] CM/SW Contact:  Deatra Robinson, [place] Phone Number: 07/05/2022, 3:00 PM   Clinical Narrative: Pt for dc to [facility] today. Spoke to [place] in admissions who confirmed they are prepared to admit pt to room 312. Pt's son aware of dc and reports agreeable. RN provided with number for report and PT[facility] arranged for transport. SW signing off at dc.   Dellie Burns, MSW, LCSW [phone] (coverage)        Final next level of care: Skilled Nursing Facility Barriers to Discharge: No Barriers Identified   Patient Goals and CMS Choice   Choice offered to / list presented to : Adult Children  Discharge Placement                Patient chooses bed at: [facility] Patient to be transferred to facility by: PTAR Name of family member notified: [name]/son Patient and family notified of of transfer: 07/05/22  Discharge Plan and Services Additional resources added to the After Visit Summary for                                       Social Determinants of Health (SDOH) Interventions SDOH Screenings   Food Insecurity: No Food Insecurity (06/29/2022)  Housing: Low Risk  (06/29/2022)  Transportation Needs: No Transportation Needs (06/29/2022)  Utilities: Not At Risk (06/29/2022)  Alcohol Screen: Low Risk  (12/17/2021)  Depression (PHQ2-9): Low Risk  (03/18/2022)  Financial Resource Strain: Low Risk  (12/17/2021)  Physical Activity: Inactive (12/17/2021)  Social Connections: Patient Declined (12/17/2021)  Stress: No Stress Concern Present (12/17/2021)  Tobacco Use: Medium Risk (06/30/2022)     Readmission Risk Interventions    11/19/2021   10:32 AM  Readmission Risk Prevention Plan  Transportation Screening Complete  PCP or Specialist Appt within 5-7 Days Complete  Home Care Screening Complete   Medication Review (RN CM) Complete

## 2022-07-05 NOTE — Care Management Important Message (Signed)
Important Message  Patient Details  Name: Yvonne Bailey MRN: [id number] Date of Birth: 1940/03/25   Medicare Important Message Given:  Yes     [name] [name] 07/05/2022, 3:11 PM

## 2022-07-05 NOTE — Progress Notes (Signed)
Pt safely discharged. Discharge packet provided to transport PTAR. VS wnL and as per flow. IV removed. Pt verbalized understanding of discharge instructions. All questions and concerns addressed.

## 2022-07-05 NOTE — Progress Notes (Signed)
   07/05/22 0004  Provider Notification  Provider Name/Title [facility]  Date Provider Notified 07/05/22  Time Provider Notified 0005  Method of Notification Page  Notification Reason Other (Comment) (pt very confused and getting agitated, climbing out of bed)  Provider response See new orders  Date of Provider Response 07/05/22  Time of Provider Response [phone]

## 2022-07-05 NOTE — Discharge Summary (Signed)
Physician Discharge Summary  Yvonne Bailey XBJ:[id number] DOB: 1941-02-02 DOA: 06/29/2022  PCP: Pincus Sanes, MD  Admit date: 06/29/2022 Discharge date: 07/05/2022 Admitted From: Home Disposition: SNF Recommendations for Outpatient Follow-up:  Recommend formal cognitive evaluation or referral to neurology Consider referral to urology for possible recurrent UTI Minimize sedating medications. Ensure good hydration and oral intake Check CMP and CBC at follow-up Check TSH in 4 to 6 weeks and adjust Synthroid as appropriate Psychiatry recommended increasing Zoloft to 100 mg in 2 to 3 weeks if needed. Please follow up on the following pending results: None   Discharge Condition: Stable CODE STATUS: Full code  Contact information for after-discharge care     Destination     [facility] Preferred SNF .   Service: Skilled Nursing Contact information: [address] [place] [address] [phone]                     Hospital course 82 year old with proximal A-fib, HTN, CKD, dementia, hypothyroidism, coronary artery disease, history of PE on Eliquis who was recently admitted to the hospital and discharged approximately 1 day ago with a similar story.  The patient has episodes of not responding up to about 1 hour where she is not talking and seems increasingly lethargic.  She was observed night and returned to her normal mentation.  At that time she was found to have undertreated hypothyroidism and her Synthroid was increased.  She had another episode where she just stopped responding she would not talk or do anything.  Patient does have history of dementia, lives with her son and goes to a senior center daily and takes the bus to get there.  This seems so unlike her and so she was brought in for this.   Neurology was consulted.  LTM EEG negative at the time of the event which were observed in the hospital no bradycardia seen on the  monitor.  Neurology felt that this could be parasomnia or behavioral issues and psychiatry was consulted, gave recommendations and signed off.  Medically ready for discharge to SNF See individual problem list below for more.   Problems addressed during this hospitalization Principal Problem:   TIA (transient ischemic attack) Active Problems:   Hypothyroidism   Dyslipidemia   Essential hypertension   Peripheral vascular disease (HCC)   Acute thromboembolism of deep veins of lower extremity (HCC)   Small vessel disease, cerebrovascular   Atrial fibrillation (HCC)   Coronary artery disease involving native coronary artery without angina pectoris   Stage 3 chronic kidney disease (HCC)   UTI (urinary tract infection)   Altered mental status   Recurrent episodes of unresponsiveness   Recurrent unresponsive episode: Recurrent episode on 4/17, 4/20, 4/22 and 4/23 unclear etiology.  CVA workup negative.  LTM EEG during the event negative.  No events on telemetry either.  Completed antibiotics for UTI.  Concern about parasomnia and behavioral issues.  Neurology recommended psychiatry consult.  Psychiatry saw patient and gave recommendation and signed off.  -Reorientation and delirium precautions -Psych recs:Treat hypothyroidism and resume Zoloft at 50 mg (half home dose).  May increase Zoloft and 2 to 3 wks -Avoid or minimize sedating medications   Acute metabolic encephalopathy: Resolved. Dementia without behavioral disturbance: -She is currently awake and alert and oriented x 4 except date and year but very limited insight. -Reorientation and delirium precautions   E faecalis UTI: Completed treatment course.   Paroxysmal A-fib: Intermittent bradycardia. -Continue  home Eliquis.   History of PE -Continue Eliquis.   Essential hypertension/history of CAD: Stable -Continue home regimen.   CKD-3A: Stable. Recent Labs (within last 365 days)              Recent Labs    04/25/22 0537  04/26/22 0612 04/27/22 0350 05/29/22 1004 06/26/22 1025 06/29/22 1637 07/01/22 0759 07/02/22 0743 07/03/22 0348 07/04/22 0606  BUN 16 17 16 19 20 18 23  30* 28* 23  CREATININE 1.01* 1.02* 1.08* 1.08* 1.11* 1.15* 1.23* 1.21* 1.24* 1.09*    -Monitor intermittently   Mild hypomagnesemia: Resolved.   Hypothyroidism: TSH and free T4 within normal. -Continue Synthroid -Recheck TSH in 4 to 6 weeks   History of depression: -Psychiatry recommendation-Continue Zoloft 50 mg daily. May increase to 100 mg in 2 to 3 weeks as needed   Vertigo?  Patient's son reported dizziness sensation with TV spinning around but dizziness or vertigo during the evaluation by vestibular PT.   Generalized weakness/physical deconditioning -Therapy recommended SNF.           Time spent 45 minutes  Vital signs Vitals:   07/04/22 1622 07/04/22 2057 07/05/22 0330 07/05/22 0754  BP: 116/68 (!) 140/42 (!) 137/50 (!) 157/77  Pulse: 71 (!) 56 60 (!) 54  Temp: (!) 97.3 F (36.3 C) 97.8 F (36.6 C) 98.4 F (36.9 C) 97.6 F (36.4 C)  Resp: 19 18 16 20   Height:      Weight:      SpO2: 100% 99% 100% 100%  TempSrc: Oral Oral  Oral  BMI (Calculated):         Discharge exam  GENERAL: No apparent distress.  Nontoxic. HEENT: MMM.  Vision and hearing grossly intact.  NECK: Supple.  No apparent JVD.  RESP:  No IWOB.  Fair aeration bilaterally. CVS:  RRR. Heart sounds normal.  ABD/GI/GU: BS+. Abd soft, NTND.  MSK/EXT:  Moves extremities. No apparent deformity. No edema.  SKIN: no apparent skin lesion or wound NEURO: Awake and alert. Oriented x 4 except date and year.  Follows commands.  No apparent focal neuro deficit. PSYCH: Calm. Normal affect.   Discharge Instructions Discharge Instructions     Diet general   Complete by: As directed    Dysphagia-3 diet   Increase activity slowly   Complete by: As directed       Allergies as of 07/05/2022       Reactions   Definity [perflutren Lipid  Microsphere] Other (See Comments)   Patient experienced back pain with injection   Pletal [cilostazol] Other (See Comments)   Made pt "feel funny"        Medication List     STOP taking these medications    acidophilus Caps capsule   spironolactone 25 MG tablet Commonly known as: ALDACTONE       TAKE these medications    acetaminophen 325 MG tablet Commonly known as: Tylenol Take 2 tablets (650 mg total) by mouth every 6 (six) hours as needed.   aspirin EC 81 MG tablet Take 81 mg by mouth daily. Swallow whole.   atorvastatin 40 MG tablet Commonly known as: LIPITOR Take 1 tablet (40 mg total) by mouth daily. What changed: how much to take   dorzolamide-timolol 2-0.5 % ophthalmic solution Commonly known as: COSOPT Place 1 drop into both eyes in the morning, at noon, and at bedtime.   Eliquis 5 MG Tabs tablet Generic drug: apixaban TAKE 1 TABLET(5 MG) BY MOUTH TWICE DAILY What changed:  See the new instructions.   levothyroxine 112 MCG tablet Commonly known as: SYNTHROID Take 1 tablet (112 mcg total) by mouth daily.   Myrbetriq 25 MG Tb24 tablet Generic drug: mirabegron ER TAKE 1 TABLET(25 MG) BY MOUTH DAILY What changed: See the new instructions.   nitroGLYCERIN 0.4 MG SL tablet Commonly known as: Nitrostat Place 1 tablet (0.4 mg total) under the tongue every 5 (five) minutes as needed for chest pain.   polyethylene glycol 17 g packet Commonly known as: MiraLax Take one dose 3 times a day until bowel clear, maximum of 3 consecutive days. What changed:  how much to take how to take this when to take this reasons to take this additional instructions   Rhopressa 0.02 % Soln Generic drug: Netarsudil Dimesylate Place 1 drop into both eyes every evening.   sertraline 50 MG tablet Commonly known as: ZOLOFT Take 1 tablet (50 mg total) by mouth daily. What changed:  medication strength how much to take   Travatan Z 0.004 % Soln ophthalmic  solution Generic drug: Travoprost (BAK Free) Place 1 drop into both eyes at bedtime.        Consultations: Neurology Psychiatry  Procedures/Studies:   EEG adult  Result Date: 07/02/2022 Charlsie Quest, MD     07/02/2022 12:27 PM Patient Name: VERNELLA NIZNIK MRN: [id number] Epilepsy Attending: Charlsie Quest Referring Physician/Provider: Dr Erick Blinks, Dr Lynden Oxford Duration: 07/01/2022 1451 to 07/02/2022 1055 Patient history: 81yo F with ams getting eeg to evaluate for seizure Level of alertness: Awake, asleep AEDs during EEG study: None Technical aspects: This EEG study was done with scalp electrodes positioned according to the 10-20 International system of electrode placement. Electrical activity was reviewed with band pass filter of 1-70Hz , sensitivity of 7 uV/mm, display speed of 57mm/sec with a 60Hz  notched filter applied as appropriate. EEG data were recorded continuously and digitally stored.  Video monitoring was available and reviewed as appropriate. Description: The posterior dominant rhythm consists of 8-9 Hz activity of moderate voltage (25-35 uV) seen predominantly in posterior head regions, symmetric and reactive to eye opening and eye closing. Sleep was characterized by vertex waves, sleep spindles (12 to 14 Hz), maximal frontocentral region. Hyperventilation and photic stimulation were not performed.   Event button was pressed on 07/02/2022 at 0943.  Patient was difficult to arouse.  Concomitant EEG before, during and after the event showed normal posterior dominant rhythm. IMPRESSION: This study is within normal limits. No seizures or epileptiform discharges were seen throughout the recording. Event button was pressed on 07/02/2022 at 0943.  Patient was difficult to arouse without concomitant EEG change.  This was a nonepileptic event. A normal interictal EEG does not exclude the diagnosis of epilepsy. Priyanka Annabelle Harman   CT HEAD WO CONTRAST ( )  Result Date:  07/01/2022 CLINICAL DATA:  Mental status change of unknown cause EXAM: CT HEAD WITHOUT CONTRAST TECHNIQUE: Contiguous axial images were obtained from the base of the skull through the vertex without intravenous contrast. RADIATION DOSE REDUCTION: This exam was performed according to the departmental dose-optimization program which includes automated exposure control, adjustment of the mA and/or kV according to patient size and/or use of iterative reconstruction technique. COMPARISON:  06/29/2022 FINDINGS: Brain: Chronic small-vessel ischemic changes affect the pons. Scattered old small vessel cerebellar infarctions without acute insult. Cerebral hemispheres show atrophy with extensive chronic small-vessel ischemic changes throughout the white matter. There is old infarction in the right basal ganglia in right frontal cortical and subcortical opercular  region. There is old infarction at the left frontoparietal cortical and subcortical brain. No mass, hemorrhage, hydrocephalus or extra-axial collection. There is some old hemosiderin deposition related to the old right basal ganglia stroke and left frontoparietal stroke. Vascular: There is atherosclerotic calcification of the major vessels at the base of the brain. Skull: Negative Sinuses/Orbits: Clear/normal Other: None IMPRESSION: No acute CT finding since the recent imaging. Extensive chronic small-vessel ischemic changes throughout the brain as outlined above. 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 Electronically Signed   By: Paulina Fusi M.D.   On: 07/01/2022 16:06   EEG adult  Result Date: 06/29/2022 Rejeana Brock, MD     06/30/2022  8:37 AM History: 82 year old female being evaluated for altered mental status Sedation: None Technique: This EEG was acquired with electrodes placed according to the International 10-20 electrode system  (including Fp1, Fp2, F3, F4, C3, C4, P3, P4, O1, O2, T3, T4, T5, T6, A1, A2, Fz, Cz, Pz). The following electrodes were missing or displaced: none. Background: The background consists of intermixed alpha and beta activities. There is a well defined posterior dominant rhythm of 8-9 hz that attenuates with eye opening.  There is anterior shifting of the posterior dominant rhythm associated with drowsiness, but sleep is not recorded. Photic stimulation: Physiologic driving is present EEG Abnormalities: None Clinical Interpretation: This normal EEG is recorded in the waking and drowsy state. There was no seizure or seizure predisposition recorded on this study. Please note that lack of epileptiform activity on EEG does not preclude the possibility of epilepsy. Ritta Slot, MD [facility] [phone] If 7pm- 7am, please page neurology on call as listed in AMION.   CT ANGIO HEAD NECK W WO CM  Result Date: 06/29/2022 CLINICAL DATA:  Stroke workup EXAM: CT ANGIOGRAPHY HEAD AND NECK WITH AND WITHOUT CONTRAST TECHNIQUE: Multidetector CT imaging of the head and neck was performed using the standard protocol during bolus administration of intravenous contrast. Multiplanar CT image reconstructions and MIPs were obtained to evaluate the vascular anatomy. Carotid stenosis measurements (when applicable) are obtained utilizing NASCET criteria, using the distal internal carotid diameter as the denominator. RADIATION DOSE REDUCTION: This exam was performed according to the departmental dose-optimization program which includes automated exposure control, adjustment of the mA and/or kV according to patient size and/or use of iterative reconstruction technique. CONTRAST:  75mL OMNIPAQUE IOHEXOL 350 MG/ML SOLN COMPARISON:  MRA head 11/13/2017 FINDINGS: CT HEAD FINDINGS Brain: There is no mass, hemorrhage or extra-axial collection. The size and configuration of the ventricles and extra-axial CSF spaces are normal.  There is no acute or chronic infarction. The brain parenchyma is normal. Skull: The visualized skull base, calvarium and extracranial soft tissues are normal. Sinuses/Orbits: No fluid levels or advanced mucosal thickening of the visualized paranasal sinuses. No mastoid or middle ear effusion. The orbits are normal. CTA NECK FINDINGS SKELETON: There is no bony spinal canal stenosis. No lytic or blastic lesion. OTHER NECK: Normal pharynx, larynx and major salivary glands. No cervical lymphadenopathy. Unremarkable thyroid gland. UPPER CHEST: No pneumothorax or pleural effusion. No nodules or masses. AORTIC ARCH: Outside the field of view RIGHT CAROTID SYSTEM: No dissection, occlusion or aneurysm. Mild atherosclerotic calcification at the carotid bifurcation without hemodynamically significant stenosis. LEFT CAROTID SYSTEM: No dissection, occlusion or aneurysm. Mild atherosclerotic calcification at the carotid bifurcation without hemodynamically significant stenosis. VERTEBRAL ARTERIES: Poor enhancement of both vertebral arteries along their entire  course CTA HEAD FINDINGS POSTERIOR CIRCULATION: --Vertebral arteries: Poor enhancement of both vertebral arteries with a diminutive basilar artery. --Inferior cerebellar arteries: Normal. --Basilar artery: Diminutive but patent. There are bilateral posterior communicating arteries. --Superior cerebellar arteries: Normal. --Posterior cerebral arteries (PCA): Normal. ANTERIOR CIRCULATION: --Intracranial internal carotid arteries: Atherosclerotic calcification of the internal carotid arteries at the skull base with moderate bilateral cavernous segment stenosis. --Anterior cerebral arteries (ACA): Normal. Both A1 segments are present. Patent anterior communicating artery (a-comm). --Middle cerebral arteries (MCA): Normal. VENOUS SINUSES: As permitted by contrast timing, patent. ANATOMIC VARIANTS: None Review of the MIP images confirms the above findings. IMPRESSION: 1. No  emergent large vessel occlusion. 2. Poor enhancement of both vertebral arteries along their entire course, favored to be chronic. Appearance is similar to the MRA head of 11/13/2017, allowing for differences in modality. 3. Atherosclerotic calcification of the internal carotid arteries at the skull base with moderate bilateral cavernous segment stenosis. 4. Mild atherosclerotic calcification at the carotid bifurcations without hemodynamically significant stenosis. Electronically Signed   By: Deatra Robinson M.D.   On: 06/29/2022 19:12   CT HEAD WO CONTRAST  Result Date: 06/29/2022 CLINICAL DATA:  Altered mental status. EXAM: CT HEAD WITHOUT CONTRAST TECHNIQUE: Contiguous axial images were obtained from the base of the skull through the vertex without intravenous contrast. RADIATION DOSE REDUCTION: This exam was performed according to the departmental dose-optimization program which includes automated exposure control, adjustment of the mA and/or kV according to patient size and/or use of iterative reconstruction technique. COMPARISON:  06/26/2022 FINDINGS: Brain: No evidence of intracranial hemorrhage, acute infarction, hydrocephalus, extra-axial collection, or mass lesion/mass effect. Mild diffuse cerebral atrophy and severe chronic small vessel disease unchanged in appearance. Old infarcts are again seen involving the left posterior parietal lobe and right basal ganglia. Vascular:  No hyperdense vessel or other acute findings. Skull: No evidence of fracture or other significant bone abnormality. Sinuses/Orbits:  No acute findings. Other: None. IMPRESSION: No acute intracranial abnormality. Stable cerebral atrophy, chronic small vessel disease, and old infarcts. Electronically Signed   By: Danae Orleans M.D.   On: 06/29/2022 14:55   DG Chest 2 View  Result Date: 06/29/2022 CLINICAL DATA:  Altered mental status EXAM: CHEST - 2 VIEW COMPARISON:  06/26/2022 FINDINGS: Mild cardiomegaly, stable. Coronary artery  stents. No focal airspace consolidation, pleural effusion, or pneumothorax. IMPRESSION: No active cardiopulmonary disease. Electronically Signed   By: Duanne Guess D.O.   On: 06/29/2022 14:41   MR BRAIN WO CONTRAST  Result Date: 06/26/2022 CLINICAL DATA:  Neuro deficit, acute, stroke suspected Not talking, altered mental status. History of strokes on CT EXAM: MRI HEAD WITHOUT CONTRAST TECHNIQUE: Multiplanar, multiecho pulse sequences of the brain and surrounding structures were obtained without intravenous contrast. COMPARISON:  None Available. FINDINGS: Brain: Remote left MCA territory infarcts. Remote left thalamic and bilateral basal ganglia lacunar infarcts. Multiple remote cerebellar infarcts. Remote right frontal cortical infarct. Additional scattered T2/FLAIR hyperintensities line matter, compatible with chronic microvascular ischemic disease. Cerebral atrophy. No evidence of acute hemorrhage, acute infarct, mass lesion, midline shift or hydrocephalus. Vascular: Abnormal flow void within the proximal basilar artery. Skull and upper cervical spine: Normal marrow signal. Sinuses/Orbits: Largely clear sinuses.  No acute orbital findings. Other: No mastoid effusions. IMPRESSION: 1. No evidence of acute infarct. 2. Abnormal flow void within the proximal basilar artery. Recommend CTA or MRA to further evaluate for stenosis, occlusion, or thrombus. 3. Many remote infarcts and chronic microvascular ischemic change. 4.  Cerebral Atrophy (ICD10-G31.9). Electronically Signed  By: Feliberto Harts M.D.   On: 06/26/2022 18:22   DG Chest Portable 1 View  Result Date: 06/26/2022 CLINICAL DATA:  Altered mental status EXAM: PORTABLE CHEST 1 VIEW COMPARISON:  02/28/2022. FINDINGS: Cardiac silhouette is prominent. There is a possible nodule that appears to be superimposed with wires in the right base. No pneumothorax. Normal pulmonary vasculature. No definite pleural effusion. Osseous structures grossly intact.  IMPRESSION: Enlarged cardiac silhouette. Possible nodule in the right base which was obscured by overlying opacities. Electronically Signed   By: Layla Maw M.D.   On: 06/26/2022 10:47   CT HEAD WO CONTRAST ( )  Result Date: 06/26/2022 CLINICAL DATA:  Altered mental status EXAM: CT HEAD WITHOUT CONTRAST TECHNIQUE: Contiguous axial images were obtained from the base of the skull through the vertex without intravenous contrast. RADIATION DOSE REDUCTION: This exam was performed according to the departmental dose-optimization program which includes automated exposure control, adjustment of the mA and/or kV according to patient size and/or use of iterative reconstruction technique. COMPARISON:  CT head 04/23/22 FINDINGS: Brain: Redemonstrated chronic left parietal lobe and right frontal lobe/basal ganglia infarcts. There is also a chronic appearing infarct in the left corona radiata. Chronic bilateral cerebellar infarcts. No hemorrhage. No hydrocephalus. No extra-axial fluid collection. There is mild ex vacuo dilatation of the left occipital horn. Vascular: No hyperdense vessel or unexpected calcification. Skull: Normal. Negative for fracture or focal lesion. Sinuses/Orbits: No middle ear or mastoid effusion. Paranasal sinuses are clear. Orbits are unremarkable. Other: None. IMPRESSION: 1. No acute intracranial abnormality. 2. Sequela of severe chronic microvascular ischemic change with multiple chronic infarcts, as above. Electronically Signed   By: Lorenza Cambridge M.D.   On: 06/26/2022 10:13       The results of significant diagnostics from this hospitalization (including imaging, microbiology, ancillary and laboratory) are listed below for reference.     Microbiology: Recent Results (from the past 240 hour(s))  Urine Culture     Status: Abnormal   Collection Time: 06/26/22 12:00 PM   Specimen: Urine, Clean Catch  Result Value Ref Range Status   Specimen Description URINE, CLEAN CATCH  Final    Special Requests   Final    NONE Performed at [facility], [address] [place], [place] [address]    Culture 60,000 COLONIES/mL ENTEROCOCCUS FAECALIS (A)  Final   Report Status 06/28/2022 FINAL  Final   Organism ID, Bacteria ENTEROCOCCUS FAECALIS (A)  Final      Susceptibility   Enterococcus faecalis - MIC*    AMPICILLIN <=2 SENSITIVE Sensitive     NITROFURANTOIN <=16 SENSITIVE Sensitive     VANCOMYCIN 1 SENSITIVE Sensitive     * 60,000 COLONIES/mL ENTEROCOCCUS FAECALIS     Labs:  CBC: Recent Labs  Lab 06/29/22 1637 07/01/22 0759 07/03/22 0348 07/04/22 0606  WBC 9.4 7.0 7.4 8.1  NEUTROABS 6.7 4.3 4.3  --   HGB 13.3 13.1 11.7* 12.3  HCT 41.0 40.0 36.3 36.2  MCV 98.3 96.6 98.9 96.0  PLT 245 235 203 208   BMP &GFR Recent Labs  Lab 06/29/22 1637 07/01/22 0759 07/02/22 0743 07/03/22 0348 07/04/22 0606  NA 139 140 138 140 139  K 4.1 4.1 4.0 3.8 4.0  CL 106 107 107 111 108  CO2 25 27 22 23 24   GLUCOSE 94 91 99 113* 90  BUN 18 23 30* 28* 23  CREATININE 1.15* 1.23* 1.21* 1.24* 1.09*  CALCIUM 8.7* 8.9 8.4* 8.1* 8.5*  MG  --  1.9 1.6* 2.0 1.8  PHOS  --   --   --   --  3.4   Estimated Creatinine Clearance: 39.4 mL/min (A) (by C-G formula based on SCr of 1.09 mg/dL (H)). Liver & Pancreas: Recent Labs  Lab 06/29/22 1637 07/01/22 0759 07/03/22 0348 07/04/22 0606  AST 29 27 25   --   ALT 20 19 19   --   ALKPHOS 102 94 90  --   BILITOT 0.8 0.7 0.6  --   PROT 6.8 6.7 5.7*  --   ALBUMIN 3.3* 3.2* 2.7* 3.0*   No results for input(s): "LIPASE", "AMYLASE" in the last 168 hours. No results for input(s): "AMMONIA" in the last 168 hours. Diabetic: No results for input(s): "HGBA1C" in the last 72 hours. Recent Labs  Lab 07/03/22 1812 07/04/22 0001 07/04/22 0618 07/04/22 1130 07/04/22 2138  GLUCAP 118* 127* 82 85 112*   Cardiac Enzymes: No results for input(s): "CKTOTAL", "CKMB", "CKMBINDEX", "TROPONINI" in the last 168 hours. No results for input(s):  "PROBNP" in the last 8760 hours. Coagulation Profile: No results for input(s): "INR", "PROTIME" in the last 168 hours. Thyroid Function Tests: No results for input(s): "TSH", "T4TOTAL", "FREET4", "T3FREE", "THYROIDAB" in the last 72 hours. Lipid Profile: No results for input(s): "CHOL", "HDL", "LDLCALC", "TRIG", "CHOLHDL", "LDLDIRECT" in the last 72 hours. Anemia Panel: No results for input(s): "VITAMINB12", "FOLATE", "FERRITIN", "TIBC", "IRON", "RETICCTPCT" in the last 72 hours. Urine analysis:    Component Value Date/Time   COLORURINE YELLOW 06/29/2022 1351   APPEARANCEUR CLOUDY (A) 06/29/2022 1351   APPEARANCEUR Cloudy (A) 03/27/2017 1332   LABSPEC 1.041 (H) 06/29/2022 1351   PHURINE 6.0 06/29/2022 1351   GLUCOSEU NEGATIVE 06/29/2022 1351   GLUCOSEU NEGATIVE 05/28/2022 1359   HGBUR MODERATE (A) 06/29/2022 1351   HGBUR negative 10/26/2007 1057   BILIRUBINUR NEGATIVE 06/29/2022 1351   BILIRUBINUR negative 12/25/2021 1655   BILIRUBINUR Negative 03/27/2017 1332   KETONESUR NEGATIVE 06/29/2022 1351   PROTEINUR 30 (A) 06/29/2022 1351   UROBILINOGEN 0.2 05/28/2022 1359   NITRITE NEGATIVE 06/29/2022 1351   LEUKOCYTESUR LARGE (A) 06/29/2022 1351   Sepsis Labs: Invalid input(s): "PROCALCITONIN", "LACTICIDVEN"   SIGNED:  Almon Hercules, MD  [facility] 07/05/2022, 11:06 AM

## 2022-07-06 DIAGNOSIS — I4811 Longstanding persistent atrial fibrillation: Secondary | ICD-10-CM | POA: Diagnosis not present

## 2022-07-06 DIAGNOSIS — E039 Hypothyroidism, unspecified: Secondary | ICD-10-CM | POA: Diagnosis not present

## 2022-07-06 DIAGNOSIS — Z719 Counseling, unspecified: Secondary | ICD-10-CM | POA: Diagnosis not present

## 2022-07-06 DIAGNOSIS — R4182 Altered mental status, unspecified: Secondary | ICD-10-CM | POA: Diagnosis not present

## 2022-07-09 DIAGNOSIS — R2689 Other abnormalities of gait and mobility: Secondary | ICD-10-CM | POA: Diagnosis not present

## 2022-07-09 DIAGNOSIS — R41841 Cognitive communication deficit: Secondary | ICD-10-CM | POA: Diagnosis not present

## 2022-07-09 DIAGNOSIS — M6281 Muscle weakness (generalized): Secondary | ICD-10-CM | POA: Diagnosis not present

## 2022-07-09 DIAGNOSIS — G459 Transient cerebral ischemic attack, unspecified: Secondary | ICD-10-CM | POA: Diagnosis not present

## 2022-07-09 DIAGNOSIS — F0390 Unspecified dementia without behavioral disturbance: Secondary | ICD-10-CM | POA: Diagnosis not present

## 2022-07-09 DIAGNOSIS — R2681 Unsteadiness on feet: Secondary | ICD-10-CM | POA: Diagnosis not present

## 2022-07-09 DIAGNOSIS — G629 Polyneuropathy, unspecified: Secondary | ICD-10-CM | POA: Diagnosis not present

## 2022-07-12 DIAGNOSIS — F0390 Unspecified dementia without behavioral disturbance: Secondary | ICD-10-CM | POA: Diagnosis not present

## 2022-07-12 DIAGNOSIS — R2689 Other abnormalities of gait and mobility: Secondary | ICD-10-CM | POA: Diagnosis not present

## 2022-07-12 DIAGNOSIS — G459 Transient cerebral ischemic attack, unspecified: Secondary | ICD-10-CM | POA: Diagnosis not present

## 2022-07-12 DIAGNOSIS — R2681 Unsteadiness on feet: Secondary | ICD-10-CM | POA: Diagnosis not present

## 2022-07-12 DIAGNOSIS — N390 Urinary tract infection, site not specified: Secondary | ICD-10-CM | POA: Diagnosis not present

## 2022-07-12 DIAGNOSIS — G629 Polyneuropathy, unspecified: Secondary | ICD-10-CM | POA: Diagnosis not present

## 2022-07-12 DIAGNOSIS — R41841 Cognitive communication deficit: Secondary | ICD-10-CM | POA: Diagnosis not present

## 2022-07-12 DIAGNOSIS — M6281 Muscle weakness (generalized): Secondary | ICD-10-CM | POA: Diagnosis not present

## 2022-07-15 DIAGNOSIS — N390 Urinary tract infection, site not specified: Secondary | ICD-10-CM | POA: Diagnosis not present

## 2022-07-17 DIAGNOSIS — R2689 Other abnormalities of gait and mobility: Secondary | ICD-10-CM | POA: Diagnosis not present

## 2022-07-17 DIAGNOSIS — R2681 Unsteadiness on feet: Secondary | ICD-10-CM | POA: Diagnosis not present

## 2022-07-17 DIAGNOSIS — R41841 Cognitive communication deficit: Secondary | ICD-10-CM | POA: Diagnosis not present

## 2022-07-17 DIAGNOSIS — M6281 Muscle weakness (generalized): Secondary | ICD-10-CM | POA: Diagnosis not present

## 2022-07-17 DIAGNOSIS — F0390 Unspecified dementia without behavioral disturbance: Secondary | ICD-10-CM | POA: Diagnosis not present

## 2022-07-17 DIAGNOSIS — G459 Transient cerebral ischemic attack, unspecified: Secondary | ICD-10-CM | POA: Diagnosis not present

## 2022-07-18 DIAGNOSIS — E039 Hypothyroidism, unspecified: Secondary | ICD-10-CM | POA: Diagnosis not present

## 2022-07-19 DIAGNOSIS — E039 Hypothyroidism, unspecified: Secondary | ICD-10-CM | POA: Diagnosis not present

## 2022-07-19 DIAGNOSIS — G629 Polyneuropathy, unspecified: Secondary | ICD-10-CM | POA: Diagnosis not present

## 2022-07-19 DIAGNOSIS — N390 Urinary tract infection, site not specified: Secondary | ICD-10-CM | POA: Diagnosis not present

## 2022-07-19 DIAGNOSIS — I10 Essential (primary) hypertension: Secondary | ICD-10-CM | POA: Diagnosis not present

## 2022-07-22 ENCOUNTER — Other Ambulatory Visit: Payer: Self-pay | Admitting: Internal Medicine

## 2022-07-25 NOTE — Patient Instructions (Addendum)
      Blood work was ordered.   The lab is on the first floor.    Medications changes include :       A referral was ordered and someone will call you to schedule an appointment.     Return in about 6 months (around 01/26/2023) for follow up.  

## 2022-07-25 NOTE — Progress Notes (Signed)
Subjective:    Patient ID: Yvonne Bailey, female    DOB: 07-08-1940, 82 y.o.   MRN: [id number]     HPI Vali is here for follow up from hospital/ rehab   ED Feb x 2, march x 1 - UTI 4/17-18 - AMS   - urine cx - ended up being pos  Admitted 4/21 - 4/26 From home, discharged to SNF  Recommendations for Outpatient Follow-up:  Recommend formal cognitive evaluation or referral to neurology Consider referral to urology for possible recurrent UTI Minimize sedating medications. Ensure good hydration and oral intake Check CMP and CBC at follow-up Check TSH in 4 to 6 weeks and adjust Synthroid as appropriate Psychiatry recommended increasing Zoloft to 100 mg in 2 to 3 weeks if needed. Please follow up on the following pending results: None  Was having episodes of being not responsive at home with lethargy.  Neuro consulted.  LTM EEG neg.  Neuro felt it could be parasomnia or behavioral issues.  Psych consulted.     Recurrent unresponsiveness -  EEG neg No events on telemetry Abx for UTI Neuro and pscyh consulted Resumed zoloft  Acute metabolic encephalopathy - resolved; dementia w/o behavioral disturbance: Little insight but orientated  UTI -  Completed treatment  Afib, h/o PE, htn, CAD, CKD - all stable.   Medications and allergies reviewed with patient and updated if appropriate.  Current Outpatient Medications on File Prior to Visit  Medication Sig Dispense Refill  . acetaminophen (TYLENOL) 325 MG tablet Take 2 tablets (650 mg total) by mouth every 6 (six) hours as needed. 100 tablet 2  . aspirin EC 81 MG tablet Take 81 mg by mouth daily. Swallow whole.    Marland Kitchen atorvastatin (LIPITOR) 40 MG tablet Take 1 tablet (40 mg total) by mouth daily. 180 tablet 3  . dorzolamide-timolol (COSOPT) 2-0.5 % ophthalmic solution Place 1 drop into both eyes in the morning, at noon, and at bedtime.    Marland Kitchen levothyroxine (SYNTHROID) 112 MCG tablet Take 1 tablet (112 mcg total) by  mouth daily. 30 tablet 0  . MYRBETRIQ 25 MG TB24 tablet TAKE 1 TABLET(25 MG) BY MOUTH DAILY (Patient taking differently: Take 25 mg by mouth daily.) 30 tablet 5  . Netarsudil Dimesylate (RHOPRESSA) 0.02 % SOLN Place 1 drop into both eyes every evening.    . nitroGLYCERIN (NITROSTAT) 0.4 MG SL tablet Place 1 tablet (0.4 mg total) under the tongue every 5 (five) minutes as needed for chest pain. 25 tablet 1  . polyethylene glycol (MIRALAX) 17 g packet Take one dose 3 times a day until bowel clear, maximum of 3 consecutive days. (Patient taking differently: Take 17 g by mouth daily as needed for mild constipation.) 9 each 0  . sertraline (ZOLOFT) 50 MG tablet Take 1 tablet (50 mg total) by mouth daily.    . Travoprost, BAK Free, (TRAVATAN Z) 0.004 % SOLN ophthalmic solution Place 1 drop into both eyes at bedtime.     No current facility-administered medications on file prior to visit.     Review of Systems     Objective:  There were no vitals filed for this visit. BP Readings from Last 3 Encounters:  07/26/22 (!) 151/71  07/05/22 (!) 123/53  06/27/22 (!) 131/57   Wt Readings from Last 3 Encounters:  07/26/22 164 lb 7.4 oz (74.6 kg)  06/29/22 162 lb (73.5 kg)  06/26/22 162 lb (73.5 kg)   There is no height or weight on  file to calculate BMI.    Physical Exam     Lab Results  Component Value Date   WBC 8.3 07/26/2022   HGB 10.7 (L) 07/26/2022   HCT 34.4 (L) 07/26/2022   PLT 234 07/26/2022   GLUCOSE 83 07/26/2022   CHOL 128 06/27/2022   TRIG 53 06/27/2022   HDL 45 06/27/2022   LDLCALC 72 06/27/2022   ALT 10 07/26/2022   AST 27 07/26/2022   NA 143 07/26/2022   K 5.1 07/26/2022   CL 108 07/26/2022   CREATININE 1.00 07/26/2022   BUN 27 (H) 07/26/2022   CO2 22 07/26/2022   TSH 8.428 (H) 06/29/2022   INR 1.3 (H) 07/26/2022   HGBA1C 5.2 03/18/2022     Assessment & Plan:    See Problem List for Assessment and Plan of chronic medical problems.    This encounter was  created in error - please disregard.

## 2022-07-26 ENCOUNTER — Encounter (HOSPITAL_COMMUNITY): Payer: Self-pay

## 2022-07-26 ENCOUNTER — Emergency Department (HOSPITAL_COMMUNITY): Payer: Medicare HMO

## 2022-07-26 ENCOUNTER — Emergency Department (HOSPITAL_COMMUNITY)
Admission: EM | Admit: 2022-07-26 | Discharge: 2022-07-26 | Disposition: A | Discharge status: Home / Self Care | Payer: Medicare HMO | Attending: Emergency Medicine | Admitting: Emergency Medicine

## 2022-07-26 ENCOUNTER — Encounter: Payer: Medicare HMO | Admitting: Internal Medicine

## 2022-07-26 DIAGNOSIS — M549 Dorsalgia, unspecified: Secondary | ICD-10-CM | POA: Diagnosis not present

## 2022-07-26 DIAGNOSIS — N3001 Acute cystitis with hematuria: Secondary | ICD-10-CM

## 2022-07-26 DIAGNOSIS — N3000 Acute cystitis without hematuria: Secondary | ICD-10-CM | POA: Diagnosis not present

## 2022-07-26 DIAGNOSIS — R4701 Aphasia: Secondary | ICD-10-CM | POA: Diagnosis not present

## 2022-07-26 DIAGNOSIS — N183 Chronic kidney disease, stage 3 unspecified: Secondary | ICD-10-CM | POA: Insufficient documentation

## 2022-07-26 DIAGNOSIS — I2510 Atherosclerotic heart disease of native coronary artery without angina pectoris: Secondary | ICD-10-CM | POA: Insufficient documentation

## 2022-07-26 DIAGNOSIS — Z79899 Other long term (current) drug therapy: Secondary | ICD-10-CM | POA: Insufficient documentation

## 2022-07-26 DIAGNOSIS — R410 Disorientation, unspecified: Secondary | ICD-10-CM

## 2022-07-26 DIAGNOSIS — I10 Essential (primary) hypertension: Secondary | ICD-10-CM | POA: Diagnosis not present

## 2022-07-26 DIAGNOSIS — I129 Hypertensive chronic kidney disease with stage 1 through stage 4 chronic kidney disease, or unspecified chronic kidney disease: Secondary | ICD-10-CM | POA: Diagnosis not present

## 2022-07-26 DIAGNOSIS — R4182 Altered mental status, unspecified: Secondary | ICD-10-CM | POA: Diagnosis present

## 2022-07-26 DIAGNOSIS — R7303 Prediabetes: Secondary | ICD-10-CM

## 2022-07-26 DIAGNOSIS — Z79890 Hormone replacement therapy (postmenopausal): Secondary | ICD-10-CM | POA: Insufficient documentation

## 2022-07-26 DIAGNOSIS — Z7901 Long term (current) use of anticoagulants: Secondary | ICD-10-CM | POA: Insufficient documentation

## 2022-07-26 DIAGNOSIS — Z8673 Personal history of transient ischemic attack (TIA), and cerebral infarction without residual deficits: Secondary | ICD-10-CM | POA: Diagnosis not present

## 2022-07-26 DIAGNOSIS — F32A Depression, unspecified: Secondary | ICD-10-CM

## 2022-07-26 DIAGNOSIS — Z7982 Long term (current) use of aspirin: Secondary | ICD-10-CM | POA: Diagnosis not present

## 2022-07-26 DIAGNOSIS — E039 Hypothyroidism, unspecified: Secondary | ICD-10-CM | POA: Diagnosis not present

## 2022-07-26 DIAGNOSIS — R569 Unspecified convulsions: Secondary | ICD-10-CM | POA: Diagnosis not present

## 2022-07-26 DIAGNOSIS — G9389 Other specified disorders of brain: Secondary | ICD-10-CM | POA: Diagnosis not present

## 2022-07-26 DIAGNOSIS — I6789 Other cerebrovascular disease: Secondary | ICD-10-CM | POA: Diagnosis not present

## 2022-07-26 DIAGNOSIS — E785 Hyperlipidemia, unspecified: Secondary | ICD-10-CM

## 2022-07-26 LAB — I-STAT CHEM 8, ED
BUN: 27 mg/dL — ABNORMAL HIGH (ref 8–23)
Calcium, Ion: 1.12 mmol/L — ABNORMAL LOW (ref 1.15–1.40)
Chloride: 108 mmol/L (ref 98–111)
Creatinine, Ser: 1 mg/dL (ref 0.44–1.00)
Glucose, Bld: 83 mg/dL (ref 70–99)
HCT: 35 % — ABNORMAL LOW (ref 36.0–46.0)
Hemoglobin: 11.9 g/dL — ABNORMAL LOW (ref 12.0–15.0)
Potassium: 5.1 mmol/L (ref 3.5–5.1)
Sodium: 143 mmol/L (ref 135–145)
TCO2: 27 mmol/L (ref 22–32)

## 2022-07-26 LAB — CBC WITH DIFFERENTIAL/PLATELET
Abs Immature Granulocytes: 0.02 10*3/uL (ref 0.00–0.07)
Basophils Absolute: 0.1 10*3/uL (ref 0.0–0.1)
Basophils Relative: 1 %
Eosinophils Absolute: 0.5 10*3/uL (ref 0.0–0.5)
Eosinophils Relative: 6 %
HCT: 34.4 % — ABNORMAL LOW (ref 36.0–46.0)
Hemoglobin: 10.7 g/dL — ABNORMAL LOW (ref 12.0–15.0)
Immature Granulocytes: 0 %
Lymphocytes Relative: 19 %
Lymphs Abs: 1.6 10*3/uL (ref 0.7–4.0)
MCH: 31.8 pg (ref 26.0–34.0)
MCHC: 31.1 g/dL (ref 30.0–36.0)
MCV: 102.4 fL — ABNORMAL HIGH (ref 80.0–100.0)
Monocytes Absolute: 0.6 10*3/uL (ref 0.1–1.0)
Monocytes Relative: 7 %
Neutro Abs: 5.5 10*3/uL (ref 1.7–7.7)
Neutrophils Relative %: 67 %
Platelets: 234 10*3/uL (ref 150–400)
RBC: 3.36 MIL/uL — ABNORMAL LOW (ref 3.87–5.11)
RDW: 13.7 % (ref 11.5–15.5)
WBC: 8.3 10*3/uL (ref 4.0–10.5)
nRBC: 0 % (ref 0.0–0.2)

## 2022-07-26 LAB — COMPREHENSIVE METABOLIC PANEL
ALT: 10 U/L (ref 0–44)
AST: 27 U/L (ref 15–41)
Albumin: 3.2 g/dL — ABNORMAL LOW (ref 3.5–5.0)
Alkaline Phosphatase: 98 U/L (ref 38–126)
Anion gap: 10 (ref 5–15)
BUN: 21 mg/dL (ref 8–23)
CO2: 22 mmol/L (ref 22–32)
Calcium: 8.5 mg/dL — ABNORMAL LOW (ref 8.9–10.3)
Chloride: 108 mmol/L (ref 98–111)
Creatinine, Ser: 0.96 mg/dL (ref 0.44–1.00)
GFR, Estimated: 59 mL/min — ABNORMAL LOW (ref >60)
Glucose, Bld: 85 mg/dL (ref 70–99)
Potassium: 5.1 mmol/L (ref 3.5–5.1)
Sodium: 140 mmol/L (ref 135–145)
Total Bilirubin: 1 mg/dL (ref 0.3–1.2)
Total Protein: 6.6 g/dL (ref 6.5–8.1)

## 2022-07-26 LAB — URINALYSIS, ROUTINE W REFLEX MICROSCOPIC
Bilirubin Urine: NEGATIVE
Glucose, UA: NEGATIVE mg/dL
Ketones, ur: NEGATIVE mg/dL
Nitrite: POSITIVE — AB
Protein, ur: NEGATIVE mg/dL
Specific Gravity, Urine: 1.013 (ref 1.005–1.030)
WBC, UA: >50 WBC/hpf (ref 0–5)
pH: 7 (ref 5.0–8.0)

## 2022-07-26 LAB — RAPID URINE DRUG SCREEN, HOSP PERFORMED
Amphetamines: NOT DETECTED
Barbiturates: NOT DETECTED
Benzodiazepines: NOT DETECTED
Cocaine: NOT DETECTED
Opiates: NOT DETECTED
Tetrahydrocannabinol: NOT DETECTED

## 2022-07-26 LAB — ETHANOL: Alcohol, Ethyl (B): <10 mg/dL (ref <10)

## 2022-07-26 LAB — APTT: aPTT: 34 seconds (ref 24–36)

## 2022-07-26 LAB — PROTIME-INR
INR: 1.3 — ABNORMAL HIGH (ref 0.8–1.2)
Prothrombin Time: 16.7 seconds — ABNORMAL HIGH (ref 11.4–15.2)

## 2022-07-26 LAB — CBG MONITORING, ED: Glucose-Capillary: 79 mg/dL (ref 70–99)

## 2022-07-26 MED ORDER — CEPHALEXIN 500 MG PO CAPS: 500.0000 mg | ORAL_CAPSULE | Freq: Two times a day (BID) | ORAL | 0 refills | Status: AC

## 2022-07-26 MED ORDER — CEPHALEXIN 250 MG PO CAPS
500.0000 mg | ORAL_CAPSULE | Freq: Once | ORAL | Status: AC
  Administered 2022-07-26: 500 mg via ORAL
  Filled 2022-07-26: qty 2

## 2022-07-26 NOTE — ED Notes (Signed)
Got patient opn the monitor did EKG patient is resting with nurse at bedside and call bell in reach

## 2022-07-26 NOTE — Progress Notes (Signed)
Transition of Care [facility]) - Emergency Department Mini Assessment   Patient Details  Name: Yvonne Bailey MRN: [id number] Date of Birth: [birth year]  Transition of Care [facility]) CM/SW Contact:    Oletta Cohn, RN Phone Number: 07/26/2022, 3:14 PM   Clinical Narrative: Pt previously active with [facility] for Home Health services RN and PT as confirmed by [facility] with Ephriam Knuckles of [facility] and Baxter Kail.  Pt will resume HH services with [facility]. No  DME needs identified at this time.    ED Mini Assessment: What brought you to the Emergency Department? : "I couldn't get my words out"  Barriers to Discharge: (P) No Barriers Identified     Means of departure: (P) Car  Interventions which prevented an admission or readmission: Home Health Consult or Services    Patient Contact and Communications Key Contact 1: (P) Son, [name]   Spoke with: (P) [name] Contact Date: (P) 07/26/22,   Contact time: (P) 1430 Contact Phone Number: (P) [phone] Call outcome: (P) spoke with [name] regarding home health services  Patient states their goals for this hospitalization and ongoing recovery are:: (P) go back home      Admission diagnosis:  Code STROKE Patient Active Problem List   Diagnosis Date Noted   Recurrent episodes of unresponsiveness 07/02/2022   Altered mental status 06/30/2022   TIA (transient ischemic attack) 06/29/2022   Moderate dementia (HCC) 04/30/2022   UTI (urinary tract infection) 04/23/2022   Fall 03/17/2022   Hypomagnesemia 11/19/2021   Hypokalemia 11/17/2021   Catheter-associated urinary tract infection (HCC) 09/03/2021   Acute metabolic encephalopathy 09/03/2021   Acute cystitis with hematuria 10/17/2020   Rash 08/29/2020   Anxiety and depression 03/28/2020   Overactive bladder 03/28/2020   Lack of motivation 12/21/2019   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   Aphasia as late effect of cerebrovascular accident (CVA)    Acute ischemic cerebrovascular accident (CVA)  involving left middle cerebral artery territory [facility])    Coronary artery disease involving native coronary artery without angina pectoris    Stage 3 chronic kidney disease (HCC)    Prediabetes 07/28/2016   DOE (dyspnea on exertion) 07/09/2016   Gait instability 07/09/2016   Cardiomyopathy, ischemic    Chronic systolic heart failure (HCC) 08/21/2015   Bilateral leg edema 07/19/2015   Atrial fibrillation (HCC) 07/19/2015   Urinary urgency 03/30/2015   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   Small vessel disease, cerebrovascular 12/17/2012   CAROTID BRUIT, RIGHT 08/10/2008   Peripheral vascular disease (HCC) 06/04/2007   Diverticulosis of large intestine 06/04/2007   Hypothyroidism 02/24/2007   Dyslipidemia 02/24/2007   Essential hypertension 02/24/2007   Acute thromboembolism of deep veins of lower extremity (HCC) 01/21/2007   PCP:  Pincus Sanes, MD Pharmacy:   [facility] [phone] [place], [place] - [address] AT [facility] OF [address] [place] [address] Phone: [phone] Fax: [phone]  [facility] - [place], [place] - [address] [place] [address] Phone: [phone] Fax: [phone]  [id number] - [place], [place] - [address] AT [facility] OF [address] [place] [address] Phone: [phone] Fax: [phone]

## 2022-07-26 NOTE — Consult Note (Signed)
Neurology Consultation  Reason for Consult: Code Stroke Referring Physician: Lockie Mola, A  CC: inability to speak  History is obtained from: Chart  HPI: Yvonne Bailey is a 82 y.o. female with a past medical history of atrial fibrillation on anticoagulation, dementia, recently hospitalized with recurrent episodes of not talking, lethargy/difficulty arousal.   In April, she had repeated episodes where she would have decreased responsiveness without following commands or speaking.  These were worked up extensively with CTA/MRI/continuous EEG.  No definitive etiology was identified, but she did have an episode that was recorded on video EEG without concurrent EEG change.   She was in her normal state of health this morning, and then was walking out to her transportation for adult daycare when she stated that she was tired and then stopped talking or following commands.  She was brought in emergently as a code stroke.  She was taken for emergent CT which was negative for any type of intracranial hemorrhage.  Given her anticoagulation she is not a candidate for any type of thrombolytic, and given her baseline, unlikely to be a candidate for any type of interventional therapy.   LKW: 8:15 AM TNK given?: No Premorbid modified Rankin scale (mRS): 3 0-Completely asymptomatic and back to baseline post-stroke 1-No significant post stroke disability and can perform usual duties with stroke symptoms 2-Slight disability-UNABLE to perform all activities but does not need assistance  3-Moderate disability-requires help but walks WITHOUT assistance 4-Needs assistance to walk and tend to bodily needs 5-Severe disability-bedridden, incontinent, needs constant attention 6- Death   Past Medical History:  Diagnosis Date   Bilateral leg edema 07/19/2015   CAD (coronary artery disease)    a. anterior MI s/p PCI in 1992. b. cath 08/2015 with severe three-vessel CAD turned down for CABG and underwent  DESx5 to prox Cx/OM2/D1/oRCA/mRCA   Carotid artery disease (HCC)    a. carotid duplex 03/2015 showed 1-39% BICA, normal subclavian arteries, chronically occluded left vertebral, f/u recommended only PRN.   DVT (deep venous thrombosis) (HCC)    X1   History of nuclear stress test    a. Myoview 6/17: EF 20-25%, mid anteroseptal, apical anterior, apical septal, apical inferior, apical lateral and apical scar, no ischemia, intermediate risk   HTN (hypertension)    Hyperlipidemia    Hypokalemia    Hypothyroidism    Ischemic cardiomyopathy    a. Echo 6/17: EF 20-25%, apex appears akinetic, MAC, moderate MR, moderate LAE, mild RVE, trivial PI, PASP 47 mmHg (needs repeat with Definity contrast).  b. Limited echo with Definity contrast 7/17: EF 25-30%, moderate to severe LAE. c. Limited Echo 2018 showed EF 40-45%.   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 02/2017 in setting of noncompliance with Eliquis   TIA (transient ischemic attack)    Tricuspid regurgitation       Family History  Problem Relation Age of Onset   Diabetes Mother    Hypertension Mother    Stroke Brother        ?> 55   Coronary artery disease Brother        stent  in 62s   Cancer Neg Hx      Social History:   reports that she quit smoking about 32 years ago. Her smoking use included cigarettes. She has never used smokeless tobacco. She reports that she does not drink alcohol and does not use drugs.  Medications No current facility-administered medications for this encounter.  Current Outpatient Medications:    acetaminophen (TYLENOL) 325 MG tablet, Take 2 tablets (650 mg  total) by mouth every 6 (six) hours as needed., Disp: 100 tablet, Rfl: 2   aspirin EC 81 MG tablet, Take 81 mg by mouth daily. Swallow whole., Disp: , Rfl:    atorvastatin (LIPITOR) 40 MG tablet, Take 1 tablet (40 mg total) by mouth daily., Disp: 180 tablet, Rfl: 3   dorzolamide-timolol (COSOPT) 2-0.5 % ophthalmic solution, Place 1 drop into both eyes in the morning, at noon, and at bedtime., Disp: , Rfl:    ELIQUIS 5 MG TABS tablet, TAKE 1 TABLET(5 MG) BY MOUTH TWICE DAILY (Patient taking differently: Take 5 mg by mouth 2 (two) times daily.), Disp: 180 tablet, Rfl: 0   levothyroxine (SYNTHROID) 112 MCG tablet, Take 1 tablet (112 mcg total) by mouth daily., Disp: 30 tablet, Rfl: 0   MYRBETRIQ 25 MG TB24 tablet, TAKE 1 TABLET(25 MG) BY MOUTH DAILY (Patient taking differently: Take 25 mg by mouth daily.), Disp: 30 tablet, Rfl: 5   Netarsudil Dimesylate (RHOPRESSA) 0.02 % SOLN, Place 1 drop into both eyes every evening., Disp: , Rfl:    nitroGLYCERIN (NITROSTAT) 0.4 MG SL tablet, Place 1 tablet (0.4 mg total) under the tongue every 5 (five) minutes as needed for chest pain., Disp: 25 tablet, Rfl: 1   polyethylene glycol (MIRALAX) 17 g packet, Take one dose 3 times a day until bowel clear, maximum of 3 consecutive days. (Patient taking differently: Take 17 g by mouth daily as needed for mild constipation.), Disp: 9 each, Rfl: 0   sertraline (ZOLOFT) 50 MG tablet, Take 1 tablet (50 mg total) by mouth daily., Disp: , Rfl:    Travoprost, BAK Free, (TRAVATAN Z) 0.004 % SOLN ophthalmic solution, Place 1 drop into both eyes at bedtime., Disp: , Rfl:    Exam: Current vital signs: There were no vitals taken for this visit. Vital signs in last 24 hours:    GENERAL: Awake, alert in NAD   NEURO:  Mental Status: AA&Ox3  Language: She does not follow commands, however when she is being moved to the CT scanner, she does cry out in pain and when asked what is hurting, she responds "my back." Cranial Nerves:  PERRL EOMI, blinks to threat bilaterally, no facial asymmetry, facial sensation intact. No evidence of tongue atrophy or fasciculations Motor: She moves all extremities with apparent symmetric strength, though she does not cooperate with formal drift or confrontational testing. Tone: is normal and bulk is normal Sensation-she response to noxious stimulation bilaterally and symmetrically No definite ataxia   Labs I have reviewed labs in epic and the results pertinent to this consultation are:   CBC    Component Value Date/Time   WBC 8.1 07/04/2022 0606   RBC 3.77 (L) 07/04/2022 0606   HGB 12.3 07/04/2022 0606   HGB 13.2 08/03/2019 1409   HCT 36.2 07/04/2022 0606   HCT 40.4 08/03/2019 1409   PLT 208 07/04/2022 0606   PLT 253 08/03/2019 1409   MCV 96.0 07/04/2022 0606   MCV 98 (H) 08/03/2019 1409   MCH 32.6 07/04/2022 0606   MCHC 34.0  07/04/2022 0606   RDW 13.1 07/04/2022 0606   RDW 12.1 08/03/2019 1409   LYMPHSABS 2.1 07/03/2022 0348   MONOABS 0.7 07/03/2022 0348   EOSABS 0.3 07/03/2022 0348   BASOSABS 0.1 07/03/2022 0348    CMP     Component Value Date/Time   NA 139 07/04/2022 0606   NA 144 08/03/2019 1409   K 4.0 07/04/2022 0606   CL 108 07/04/2022 0606   CO2 24 07/04/2022 0606   GLUCOSE 90 07/04/2022 0606   BUN 23 07/04/2022 0606   BUN 22 08/03/2019 1409   CREATININE 1.09 (H) 07/04/2022 0606   CREATININE 1.14 (H) 09/15/2015 1025   CALCIUM 8.5 (L) 07/04/2022 0606   PROT 5.7 (L) 07/03/2022 0348   PROT 6.5 04/13/2018 0000   ALBUMIN 3.0 (L) 07/04/2022 0606   ALBUMIN 4.1 04/13/2018 0000   AST 25 07/03/2022 0348   ALT 19 07/03/2022 0348   ALKPHOS 90 07/03/2022 0348   BILITOT 0.6 07/03/2022 0348   BILITOT 0.7 04/13/2018 0000   GFRNONAA 51 (L) 07/04/2022 0606   GFRAA 60 08/03/2019 1409    Lipid Panel     Component Value Date/Time   CHOL 128 06/27/2022 0321   CHOL 131 06/03/2017 0921   TRIG 53 06/27/2022 0321   HDL 45 06/27/2022 0321   HDL 40 06/03/2017  0921   CHOLHDL 2.8 06/27/2022 0321   VLDL 11 06/27/2022 0321   LDLCALC 72 06/27/2022 0321   LDLCALC 72 06/03/2017 0921     Imaging I have reviewed the images obtained:  CT-head-negative  Assessment:  82 y.o. female presenting with recurrent episodes stereotyped with decreased responsiveness and impaired language.  She had continuous EEG which captured a previous episode, which was negative for any type of seizure.  This lowers the likelihood of epilepsy as etiology of her events considerably.  The duration is also unusual for epilepsy.  TIAs are unlikely given the recurrent stereotyped nature.  Though she does not have prominent findings on her MRI for her cerebral amyloid angiopathy, I think TFNE(transient focal neurological episodes) associated with cerebral amyloid are certainly possible given her history of dementia.  She was recently started on low-dose of gabapentin, but not at an antiepileptic dose.  She did have urinary tract infection associated with her previous episodes, so I think would be reasonable to check this again at this time.  Recommendations: -If returns to baseline, I would not make any acute changes at this time. -If she continues to be significantly altered, would consider repeating an MRI of her brain. -Consider rechecking urinalysis.   Ritta Slot, MD [facility] [phone]  If 7pm- 7am, please page neurology on call as listed in AMION.

## 2022-07-26 NOTE — ED Notes (Signed)
Help get patient changed another brief placed help with in and out cath placed socks and bed alarm got patient some warm blankets patient is resting with call bell in reach

## 2022-07-26 NOTE — Code Documentation (Signed)
Stroke Response Nurse Documentation Code Documentation  Yvonne Bailey is a 82 y.o. female arriving to [facility]  via [facility] on 07/26/2022 with past medical hx of afib, dementia, recent hospitalizations for nonverbal episodes, lethargy/difficulty arousing. On Eliquis (apixaban) daily. Code stroke was activated by ED.   Patient from home where she was LKW at [phone] and now complaining of nonverbal and not following commands. Patient was walking to the transport vehicle to be taken to adult daycare when she stated she felt tired and then stopped speaking and following commands. Patient was guided to a chair and EMS called. Upon EMS arrival, patient was nonverbal and not following commands  Stroke team at the bedside on patient arrival. Labs drawn and patient cleared for CT by Dr. Lockie Mola. Patient to CT with team. NIHSS 17, see documentation for details and code stroke times. Patient with disoriented, not following commands, bilateral arm weakness, bilateral leg weakness, Global aphasia , and dysarthria  on exam. The following imaging was completed:  CT Head. Patient is not a candidate for IV Thrombolytic due to anticoagulation. Patient is not a candidate for IR due to no acute LVO suspected per MD.   Code Stroke Canceled  Bedside handoff with ED RN Colin Mulders.    Felecia Jan  Stroke Response RN

## 2022-07-26 NOTE — ED Triage Notes (Signed)
Pt brought in by EMS after family called for AMS/nonverbal when she was on her way to adult day care. Code stroke called at the bridge for [facility] 0815.   When moving to CT table pt spoke her first words at the hospital yelling out "my back my back!" Speech was clear. Pt still not following commands  Code stroke cancelled while at CT

## 2022-07-26 NOTE — ED Notes (Signed)
Pt wet from urine offered change of clothing pt angry and confused

## 2022-07-26 NOTE — Discharge Instructions (Signed)
Take next dose antibiotic around dinnertime tonight.  Follow-up with neurology outpatient.

## 2022-07-26 NOTE — ED Notes (Signed)
Patient given water and sandwich with apple sauce; okay'd per MD [facility]

## 2022-07-26 NOTE — ED Notes (Addendum)
This RN spoke to patient's son [name] on the phone. Pt stated he would be about an hour before he could come pick her up. This RN instructed the son to park right outside the ED lobby and come let security know he was here to pick up his mother and then they could call back to the RN to bring her out to him via wheelchair. Pt's son agreeable to the plan. Will move patient to purple zone to await son's arrival   This RN also went over discharge paperwork with the son and informed him about the UTI/antibiotics and to follow up with neurology and her PCP

## 2022-07-26 NOTE — ED Provider Notes (Signed)
[facility] EMERGENCY DEPARTMENT AT [facility] Provider Note   CSN: [id number] Arrival date & time: 07/26/22  4098  An emergency department physician performed an initial assessment on this suspected stroke patient at 0935.  History  Chief Complaint  Patient presents with   Altered Mental Status    Yvonne Bailey is a 82 y.o. female.  Level 5 caveat as patient unable to speak.  Code stroke initiated in the field with EMS.  Last known normal about an hour ago.  Just stopped talking and became unresponsive but awake.  Not following commands.  Hypertensive with EMS.  A-fib rate controlled on EKG.  Blood sugar normal.  Patient may be with episodes like this in the past.  Have not been able to really test strength.  The history is provided by the patient and the EMS personnel.       Home Medications Prior to Admission medications   Medication Sig Start Date End Date Taking? Authorizing Provider  cephALEXin (KEFLEX) 500 MG capsule Take 1 capsule (500 mg total) by mouth 2 (two) times daily for 5 days. 07/26/22 07/31/22 Yes Devion Chriscoe, DO  acetaminophen (TYLENOL) 325 MG tablet Take 2 tablets (650 mg total) by mouth every 6 (six) hours as needed. 07/04/22 07/04/23  Almon Hercules, MD  aspirin EC 81 MG tablet Take 81 mg by mouth daily. Swallow whole.    [provider]  atorvastatin (LIPITOR) 40 MG tablet Take 1 tablet (40 mg total) by mouth daily. 07/04/22   Almon Hercules, MD  dorzolamide-timolol (COSOPT) 2-0.5 % ophthalmic solution Place 1 drop into both eyes in the morning, at noon, and at bedtime.    [provider]  ELIQUIS 5 MG TABS tablet TAKE 1 TABLET(5 MG) BY MOUTH TWICE DAILY Patient taking differently: Take 5 mg by mouth 2 (two) times daily. 05/21/22   Pincus Sanes, MD  levothyroxine (SYNTHROID) 112 MCG tablet Take 1 tablet (112 mcg total) by mouth daily. 06/28/22   Rolly Salter, MD  MYRBETRIQ 25 MG TB24 tablet TAKE 1 TABLET(25 MG) BY MOUTH  DAILY Patient taking differently: Take 25 mg by mouth daily. 04/30/22   Pincus Sanes, MD  Netarsudil Dimesylate (RHOPRESSA) 0.02 % SOLN Place 1 drop into both eyes every evening.    [provider]  nitroGLYCERIN (NITROSTAT) 0.4 MG SL tablet Place 1 tablet (0.4 mg total) under the tongue every 5 (five) minutes as needed for chest pain. 09/08/15   Little Ishikawa, NP  polyethylene glycol (MIRALAX) 17 g packet Take one dose 3 times a day until bowel clear, maximum of 3 consecutive days. Patient taking differently: Take 17 g by mouth daily as needed for mild constipation. 10/30/20   Elpidio Anis, PA-C  sertraline (ZOLOFT) 50 MG tablet Take 1 tablet (50 mg total) by mouth daily. 07/04/22   Almon Hercules, MD  Travoprost, BAK Free, (TRAVATAN Z) 0.004 % SOLN ophthalmic solution Place 1 drop into both eyes at bedtime.    [provider]      Allergies    Definity [perflutren lipid microsphere] and Pletal [cilostazol]    Review of Systems   Review of Systems  Physical Exam Updated Vital Signs BP (!) 155/79   Pulse 70   Temp 97.7 F (36.5 C) (Oral)   Resp 20   Ht 5\' 7"  (1.702 m)   Wt 74.6 kg   SpO2 100%   BMI 25.76 kg/m  Physical Exam Vitals and nursing note  reviewed.  Constitutional:      General: She is not in acute distress.    Appearance: She is well-developed. She is not ill-appearing.  HENT:     Head: Normocephalic and atraumatic.     Mouth/Throat:     Mouth: Mucous membranes are moist.  Eyes:     Extraocular Movements: Extraocular movements intact.     Conjunctiva/sclera: Conjunctivae normal.     Pupils: Pupils are equal, round, and reactive to light.  Cardiovascular:     Rate and Rhythm: Normal rate and regular rhythm.     Heart sounds: No murmur heard. Pulmonary:     Effort: Pulmonary effort is normal. No respiratory distress.     Breath sounds: Normal breath sounds.  Abdominal:     Palpations: Abdomen is soft.     Tenderness: There is no abdominal  tenderness.  Musculoskeletal:        General: No swelling.     Cervical back: Neck supple.  Skin:    General: Skin is warm and dry.     Capillary Refill: Capillary refill takes less than 2 seconds.  Neurological:     Mental Status: She is alert.     Comments: Patient able to move extremities now and talking a little bit, she is complaining of some back pain, does not really follow commands great but does appear to move all 4 extremities, her speech is clear, no obvious facial droop  Psychiatric:        Mood and Affect: Mood normal.     ED Results / Procedures / Treatments   Labs (all labs ordered are listed, but only abnormal results are displayed) Labs Reviewed  PROTIME-INR - Abnormal; Notable for the following components:      Result Value   Prothrombin Time 16.7 (*)    INR 1.3 (*)    All other components within normal limits  COMPREHENSIVE METABOLIC PANEL - Abnormal; Notable for the following components:   Calcium 8.5 (*)    Albumin 3.2 (*)    GFR, Estimated 59 (*)    All other components within normal limits  URINALYSIS, ROUTINE W REFLEX MICROSCOPIC - Abnormal; Notable for the following components:   APPearance HAZY (*)    Hgb urine dipstick MODERATE (*)    Nitrite POSITIVE (*)    Leukocytes,Ua LARGE (*)    Bacteria, UA RARE (*)    All other components within normal limits  CBC WITH DIFFERENTIAL/PLATELET - Abnormal; Notable for the following components:   RBC 3.36 (*)    Hemoglobin 10.7 (*)    HCT 34.4 (*)    MCV 102.4 (*)    All other components within normal limits  I-STAT CHEM 8, ED - Abnormal; Notable for the following components:   BUN 27 (*)    Calcium, Ion 1.12 (*)    Hemoglobin 11.9 (*)    HCT 35.0 (*)    All other components within normal limits  URINE CULTURE  ETHANOL  APTT  RAPID URINE DRUG SCREEN, HOSP PERFORMED  CBG MONITORING, ED    EKG EKG Interpretation  Date/Time:  Friday Jul 26 2022 09:54:00 EDT Ventricular Rate:  79 PR Interval:     QRS Duration: 96 QT Interval:  427 QTC Calculation: 464 R Axis:   34 Text Interpretation: Atrial fibrillation Multiple ventricular premature complexes Anterior infarct, old Confirmed by Lockie Mola, Yvonne Bailey ([id number]) on 07/26/2022 10:08:15 AM  Radiology CT HEAD CODE STROKE WO CONTRAST  Result Date: 07/26/2022 CLINICAL DATA:  Code stroke.  82 year old female. EXAM: CT HEAD WITHOUT CONTRAST TECHNIQUE: Contiguous axial images were obtained from the base of the skull through the vertex without intravenous contrast. RADIATION DOSE REDUCTION: This exam was performed according to the departmental dose-optimization program which includes automated exposure control, adjustment of the mA and/or kV according to patient size and/or use of iterative reconstruction technique. COMPARISON:  CTA head and neck, head CT 06/29/2022. Brain MRI 06/26/2022. FINDINGS: Brain: Stable cerebral volume. No midline shift, ventriculomegaly, mass effect, evidence of mass lesion, intracranial hemorrhage or evidence of cortically based acute infarction. 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 Skull: Stable.  No acute osseous abnormality identified. Sinuses/Orbits: Visualized paranasal sinuses and mastoids are stable and well aerated. Other: No gaze deviation, acute orbit or scalp soft tissue finding. ASPECTS [facility] Stroke Program Early CT Score) Total score (0-10 with 10 being normal): 10, chronic encephalomalacia. IMPRESSION: 1. Stable non contrast CT appearance of chronic ischemia. No acute cortically based infarct or acute intracranial hemorrhage identified. ASPECTS 10. 2. These results were communicated to Dr. Amada Jupiter at [phone] hours on 07/26/2022 by text page via the [facility] messaging system. Electronically Signed   By: Odessa Fleming M.D.   On: 07/26/2022 09:59    Procedures Procedures    Medications Ordered  in ED Medications  cephALEXin (KEFLEX) capsule 500 mg (500 mg Oral Given 07/26/22 1238)    ED Course/ Medical Decision Making/ A&P                             Medical Decision Making Amount and/or Complexity of Data Reviewed Labs: ordered. Radiology: ordered.  Risk Prescription drug management.   ZAFINA ALDAVA is here as a code stroke.  Unremarkable vitals except for mild hypertension.  Patient overall stopped talking about an hour ago.  May be a history of the same.  History of A-fib on Eliquis.  Her exam is somewhat difficult but when we took the patient for CT scan with Dr. Amada Jupiter with neurology she started to talk and move all of her extremities.  We did a chart review it appears that she has had some episodes of inability to speak and may be some unresponsiveness.  This was captured on long-term monitoring at last hospital admission that did not show any seizure-like activity.  Her exam does not appear consistent with a stroke.  She had a head CT that was unremarkable.  Will check basic labs to look for urine infection or electrolyte abnormality or anemia.  She seems to be back at her baseline now.  Neurology thinks that these could be transient focal neurologic episodes.  Possibly related to her dementia.  Overall neurology does not recommend any further workup at this time.  She seems to be back at baseline or close to it.  Will await family.  May need to check MRI of brain but seems that she is improving.  Will check urinalysis.  EKG shows rate controlled atrial fibrillation.  Overall patient is back at her baseline.  She has been able to eat and drink without any issues.  Will treat for possible urine infection.  Overall patient will follow-up with neurology outpatient.  Family understands the plan.  They have been updated on what we think is going on.  This chart was dictated using voice recognition software.  Despite best efforts to proofread,  errors can occur which  can  change the documentation meaning.         Final Clinical Impression(s) / ED Diagnoses Final diagnoses:  Acute cystitis without hematuria  Confusion    Rx / DC Orders ED Discharge Orders          Ordered    cephALEXin (KEFLEX) 500 MG capsule  2 times daily        07/26/22 1312              [place], DO 07/26/22 1313

## 2022-07-27 LAB — URINE CULTURE: Culture: >=100000 — AB

## 2022-07-28 LAB — URINE CULTURE

## 2022-07-29 ENCOUNTER — Telehealth (HOSPITAL_BASED_OUTPATIENT_CLINIC_OR_DEPARTMENT_OTHER): Payer: Self-pay | Admitting: *Deleted

## 2022-07-29 NOTE — Telephone Encounter (Signed)
Post ED Visit - Positive Culture Follow-up  Culture report reviewed by antimicrobial stewardship pharmacist: [facility] [x]  [facility] Pharm.D. []  Celedonio Miyamoto, Pharm.D., BCPS AQ-ID []  Garvin Fila, Pharm.D., BCPS []  Georgina Pillion, Pharm.D., BCPS []  [place], [address].D., BCPS, AAHIVP []  Estella Husk, Pharm.D., BCPS, AAHIVP []  Lysle Pearl, PharmD, BCPS []  Phillips Climes, PharmD, BCPS []  Agapito Games, PharmD, BCPS []  Verlan Friends, PharmD []  Mervyn Gay, PharmD, BCPS []  Vinnie Level, PharmD  [facility] []  Len Childs, PharmD []  Greer Pickerel, PharmD []  Adalberto Cole, PharmD []  Perlie Gold, Rph []  Lonell Face) Jean Rosenthal, PharmD []  Earl Many, PharmD []  Junita Push, PharmD []  Dorna Leitz, PharmD []  Terrilee Files, PharmD []  Lynann [place], PharmD []  Keturah Barre, PharmD []  Loralee Pacas, PharmD []  Bernadene Person, PharmD   Positive urine culture Treated with Cepahlexin, organism sensitive to the same and no further patient follow-up is required at this time.  Nena Polio Garner Nash 07/29/2022, 10:48 AM

## 2022-07-30 ENCOUNTER — Telehealth: Payer: Self-pay

## 2022-07-30 NOTE — Telephone Encounter (Signed)
Transition Care Management Unsuccessful Follow-up Telephone Call  Date of discharge and from where:  07/26/2022 [facility]  Attempts:  1st Attempt  Reason for unsuccessful TCM follow-up call:  Unable to leave message  Juandavid Dallman [name]  [facility] Population Health Community Resource Care Guide   [email or web address]  ?? [phone]   Website: [email or web address]  [email or web address]

## 2022-08-01 ENCOUNTER — Telehealth: Payer: Self-pay

## 2022-08-01 NOTE — Telephone Encounter (Signed)
Transition Care Management Follow-up Telephone Call Date of discharge and from where: 07/26/2022 [facility]. Patient is in nursing facility. How have you been since you were released from the hospital? Patient is feeling better.  [name] [name]  [facility] Population Health Community Resource Care Guide   [name]  ?? [phone]   Website: [email or web address]  [email or web address]

## 2022-08-24 ENCOUNTER — Other Ambulatory Visit: Payer: Self-pay | Admitting: Internal Medicine

## 2022-08-24 DIAGNOSIS — I739 Peripheral vascular disease, unspecified: Secondary | ICD-10-CM

## 2022-09-05 ENCOUNTER — Other Ambulatory Visit: Payer: Self-pay | Admitting: Internal Medicine

## 2022-10-02 ENCOUNTER — Encounter: Payer: Self-pay | Admitting: Neurology

## 2022-10-02 ENCOUNTER — Ambulatory Visit: Payer: Medicare HMO | Admitting: Neurology

## 2022-10-08 ENCOUNTER — Other Ambulatory Visit: Payer: Self-pay | Admitting: Internal Medicine

## 2022-11-19 ENCOUNTER — Other Ambulatory Visit: Payer: Self-pay

## 2022-11-19 ENCOUNTER — Other Ambulatory Visit (INDEPENDENT_AMBULATORY_CARE_PROVIDER_SITE_OTHER): Payer: Medicare HMO

## 2022-11-19 DIAGNOSIS — R300 Dysuria: Secondary | ICD-10-CM | POA: Diagnosis not present

## 2022-11-19 DIAGNOSIS — R350 Frequency of micturition: Secondary | ICD-10-CM

## 2022-11-20 ENCOUNTER — Other Ambulatory Visit: Payer: Self-pay | Admitting: Internal Medicine

## 2022-11-20 DIAGNOSIS — R350 Frequency of micturition: Secondary | ICD-10-CM | POA: Diagnosis not present

## 2022-11-20 LAB — URINALYSIS, ROUTINE W REFLEX MICROSCOPIC
Bilirubin Urine: NEGATIVE
Ketones, ur: NEGATIVE
Nitrite: NEGATIVE
Specific Gravity, Urine: 1.015 (ref 1.000–1.030)
Total Protein, Urine: NEGATIVE
Urine Glucose: NEGATIVE
Urobilinogen, UA: 0.2 (ref 0.0–1.0)
pH: 6 (ref 5.0–8.0)

## 2022-11-20 MED ORDER — NITROFURANTOIN MONOHYD MACRO 100 MG PO CAPS: 100.0000 mg | ORAL_CAPSULE | Freq: Two times a day (BID) | ORAL | 0 refills | Status: DC

## 2022-11-21 ENCOUNTER — Other Ambulatory Visit: Payer: Self-pay | Admitting: Internal Medicine

## 2022-11-21 ENCOUNTER — Ambulatory Visit: Payer: Medicare HMO | Admitting: Internal Medicine

## 2022-11-21 DIAGNOSIS — I739 Peripheral vascular disease, unspecified: Secondary | ICD-10-CM

## 2022-11-23 ENCOUNTER — Encounter (HOSPITAL_COMMUNITY): Payer: Self-pay | Admitting: *Deleted

## 2022-11-23 ENCOUNTER — Inpatient Hospital Stay (HOSPITAL_COMMUNITY)
Admission: EM | Admit: 2022-11-23 | Discharge: 2022-11-27 | DRG: 689 | Disposition: A | Discharge status: Skilled Nursing Facility | Payer: Medicare HMO | Attending: Internal Medicine | Admitting: Internal Medicine

## 2022-11-23 ENCOUNTER — Emergency Department (HOSPITAL_COMMUNITY): Payer: Medicare HMO

## 2022-11-23 ENCOUNTER — Observation Stay (HOSPITAL_COMMUNITY): Payer: Medicare HMO

## 2022-11-23 ENCOUNTER — Other Ambulatory Visit: Payer: Self-pay

## 2022-11-23 DIAGNOSIS — W1811XA Fall from or off toilet without subsequent striking against object, initial encounter: Secondary | ICD-10-CM | POA: Diagnosis present

## 2022-11-23 DIAGNOSIS — I5022 Chronic systolic (congestive) heart failure: Secondary | ICD-10-CM | POA: Diagnosis present

## 2022-11-23 DIAGNOSIS — I2510 Atherosclerotic heart disease of native coronary artery without angina pectoris: Secondary | ICD-10-CM | POA: Diagnosis present

## 2022-11-23 DIAGNOSIS — I252 Old myocardial infarction: Secondary | ICD-10-CM

## 2022-11-23 DIAGNOSIS — M79652 Pain in left thigh: Secondary | ICD-10-CM | POA: Diagnosis not present

## 2022-11-23 DIAGNOSIS — J90 Pleural effusion, not elsewhere classified: Principal | ICD-10-CM

## 2022-11-23 DIAGNOSIS — I10 Essential (primary) hypertension: Secondary | ICD-10-CM | POA: Diagnosis not present

## 2022-11-23 DIAGNOSIS — I6782 Cerebral ischemia: Secondary | ICD-10-CM | POA: Diagnosis not present

## 2022-11-23 DIAGNOSIS — R410 Disorientation, unspecified: Secondary | ICD-10-CM

## 2022-11-23 DIAGNOSIS — R0989 Other specified symptoms and signs involving the circulatory and respiratory systems: Secondary | ICD-10-CM | POA: Diagnosis not present

## 2022-11-23 DIAGNOSIS — I672 Cerebral atherosclerosis: Secondary | ICD-10-CM | POA: Diagnosis not present

## 2022-11-23 DIAGNOSIS — K529 Noninfective gastroenteritis and colitis, unspecified: Secondary | ICD-10-CM | POA: Diagnosis not present

## 2022-11-23 DIAGNOSIS — E785 Hyperlipidemia, unspecified: Secondary | ICD-10-CM | POA: Diagnosis present

## 2022-11-23 DIAGNOSIS — J189 Pneumonia, unspecified organism: Secondary | ICD-10-CM | POA: Diagnosis not present

## 2022-11-23 DIAGNOSIS — E1122 Type 2 diabetes mellitus with diabetic chronic kidney disease: Secondary | ICD-10-CM | POA: Diagnosis present

## 2022-11-23 DIAGNOSIS — M79651 Pain in right thigh: Secondary | ICD-10-CM | POA: Diagnosis not present

## 2022-11-23 DIAGNOSIS — B9620 Unspecified Escherichia coli [E. coli] as the cause of diseases classified elsewhere: Secondary | ICD-10-CM | POA: Diagnosis present

## 2022-11-23 DIAGNOSIS — E039 Hypothyroidism, unspecified: Secondary | ICD-10-CM | POA: Diagnosis present

## 2022-11-23 DIAGNOSIS — R456 Violent behavior: Secondary | ICD-10-CM | POA: Diagnosis not present

## 2022-11-23 DIAGNOSIS — Z79890 Hormone replacement therapy (postmenopausal): Secondary | ICD-10-CM | POA: Diagnosis not present

## 2022-11-23 DIAGNOSIS — R627 Adult failure to thrive: Secondary | ICD-10-CM | POA: Diagnosis present

## 2022-11-23 DIAGNOSIS — I255 Ischemic cardiomyopathy: Secondary | ICD-10-CM | POA: Diagnosis present

## 2022-11-23 DIAGNOSIS — I4811 Longstanding persistent atrial fibrillation: Secondary | ICD-10-CM | POA: Diagnosis present

## 2022-11-23 DIAGNOSIS — I4891 Unspecified atrial fibrillation: Secondary | ICD-10-CM | POA: Diagnosis not present

## 2022-11-23 DIAGNOSIS — F0393 Unspecified dementia, unspecified severity, with mood disturbance: Secondary | ICD-10-CM | POA: Diagnosis present

## 2022-11-23 DIAGNOSIS — K5289 Other specified noninfective gastroenteritis and colitis: Secondary | ICD-10-CM | POA: Diagnosis present

## 2022-11-23 DIAGNOSIS — N2889 Other specified disorders of kidney and ureter: Secondary | ICD-10-CM | POA: Diagnosis not present

## 2022-11-23 DIAGNOSIS — M1611 Unilateral primary osteoarthritis, right hip: Secondary | ICD-10-CM | POA: Diagnosis not present

## 2022-11-23 DIAGNOSIS — M79604 Pain in right leg: Secondary | ICD-10-CM | POA: Diagnosis not present

## 2022-11-23 DIAGNOSIS — Z955 Presence of coronary angioplasty implant and graft: Secondary | ICD-10-CM

## 2022-11-23 DIAGNOSIS — N1831 Chronic kidney disease, stage 3a: Secondary | ICD-10-CM | POA: Diagnosis present

## 2022-11-23 DIAGNOSIS — Z7189 Other specified counseling: Secondary | ICD-10-CM | POA: Diagnosis not present

## 2022-11-23 DIAGNOSIS — F05 Delirium due to known physiological condition: Secondary | ICD-10-CM | POA: Diagnosis present

## 2022-11-23 DIAGNOSIS — I130 Hypertensive heart and chronic kidney disease with heart failure and stage 1 through stage 4 chronic kidney disease, or unspecified chronic kidney disease: Secondary | ICD-10-CM | POA: Diagnosis present

## 2022-11-23 DIAGNOSIS — I071 Rheumatic tricuspid insufficiency: Secondary | ICD-10-CM | POA: Diagnosis present

## 2022-11-23 DIAGNOSIS — Z515 Encounter for palliative care: Secondary | ICD-10-CM | POA: Diagnosis not present

## 2022-11-23 DIAGNOSIS — I700 Atherosclerosis of aorta: Secondary | ICD-10-CM | POA: Diagnosis not present

## 2022-11-23 DIAGNOSIS — G9341 Metabolic encephalopathy: Secondary | ICD-10-CM | POA: Diagnosis present

## 2022-11-23 DIAGNOSIS — R531 Weakness: Secondary | ICD-10-CM | POA: Diagnosis not present

## 2022-11-23 DIAGNOSIS — Z1152 Encounter for screening for COVID-19: Secondary | ICD-10-CM

## 2022-11-23 DIAGNOSIS — Z823 Family history of stroke: Secondary | ICD-10-CM

## 2022-11-23 DIAGNOSIS — Z86718 Personal history of other venous thrombosis and embolism: Secondary | ICD-10-CM

## 2022-11-23 DIAGNOSIS — R41841 Cognitive communication deficit: Secondary | ICD-10-CM | POA: Diagnosis not present

## 2022-11-23 DIAGNOSIS — Z8249 Family history of ischemic heart disease and other diseases of the circulatory system: Secondary | ICD-10-CM

## 2022-11-23 DIAGNOSIS — Z7982 Long term (current) use of aspirin: Secondary | ICD-10-CM

## 2022-11-23 DIAGNOSIS — J168 Pneumonia due to other specified infectious organisms: Secondary | ICD-10-CM | POA: Diagnosis not present

## 2022-11-23 DIAGNOSIS — K5641 Fecal impaction: Secondary | ICD-10-CM | POA: Diagnosis present

## 2022-11-23 DIAGNOSIS — Z7401 Bed confinement status: Secondary | ICD-10-CM | POA: Diagnosis not present

## 2022-11-23 DIAGNOSIS — S0990XA Unspecified injury of head, initial encounter: Secondary | ICD-10-CM | POA: Diagnosis not present

## 2022-11-23 DIAGNOSIS — M1612 Unilateral primary osteoarthritis, left hip: Secondary | ICD-10-CM | POA: Diagnosis not present

## 2022-11-23 DIAGNOSIS — S199XXA Unspecified injury of neck, initial encounter: Secondary | ICD-10-CM | POA: Diagnosis not present

## 2022-11-23 DIAGNOSIS — Z7901 Long term (current) use of anticoagulants: Secondary | ICD-10-CM

## 2022-11-23 DIAGNOSIS — Z888 Allergy status to other drugs, medicaments and biological substances status: Secondary | ICD-10-CM

## 2022-11-23 DIAGNOSIS — R1310 Dysphagia, unspecified: Secondary | ICD-10-CM | POA: Diagnosis present

## 2022-11-23 DIAGNOSIS — N390 Urinary tract infection, site not specified: Secondary | ICD-10-CM | POA: Diagnosis not present

## 2022-11-23 DIAGNOSIS — Z87891 Personal history of nicotine dependence: Secondary | ICD-10-CM

## 2022-11-23 DIAGNOSIS — M19071 Primary osteoarthritis, right ankle and foot: Secondary | ICD-10-CM | POA: Diagnosis not present

## 2022-11-23 DIAGNOSIS — E876 Hypokalemia: Secondary | ICD-10-CM | POA: Diagnosis present

## 2022-11-23 DIAGNOSIS — Z8673 Personal history of transient ischemic attack (TIA), and cerebral infarction without residual deficits: Secondary | ICD-10-CM

## 2022-11-23 DIAGNOSIS — Z833 Family history of diabetes mellitus: Secondary | ICD-10-CM

## 2022-11-23 DIAGNOSIS — R079 Chest pain, unspecified: Secondary | ICD-10-CM | POA: Diagnosis not present

## 2022-11-23 DIAGNOSIS — N3281 Overactive bladder: Secondary | ICD-10-CM | POA: Diagnosis not present

## 2022-11-23 DIAGNOSIS — M6281 Muscle weakness (generalized): Secondary | ICD-10-CM | POA: Diagnosis not present

## 2022-11-23 DIAGNOSIS — R1312 Dysphagia, oropharyngeal phase: Secondary | ICD-10-CM | POA: Diagnosis not present

## 2022-11-23 DIAGNOSIS — F419 Anxiety disorder, unspecified: Secondary | ICD-10-CM | POA: Diagnosis not present

## 2022-11-23 DIAGNOSIS — M1711 Unilateral primary osteoarthritis, right knee: Secondary | ICD-10-CM | POA: Diagnosis not present

## 2022-11-23 DIAGNOSIS — R0602 Shortness of breath: Secondary | ICD-10-CM | POA: Diagnosis not present

## 2022-11-23 DIAGNOSIS — Z79899 Other long term (current) drug therapy: Secondary | ICD-10-CM

## 2022-11-23 DIAGNOSIS — F03B0 Unspecified dementia, moderate, without behavioral disturbance, psychotic disturbance, mood disturbance, and anxiety: Secondary | ICD-10-CM | POA: Diagnosis not present

## 2022-11-23 DIAGNOSIS — Z87440 Personal history of urinary (tract) infections: Secondary | ICD-10-CM

## 2022-11-23 DIAGNOSIS — R109 Unspecified abdominal pain: Secondary | ICD-10-CM | POA: Diagnosis not present

## 2022-11-23 DIAGNOSIS — R278 Other lack of coordination: Secondary | ICD-10-CM | POA: Diagnosis not present

## 2022-11-23 LAB — RESP PANEL BY RT-PCR (RSV, FLU A&B, COVID)  RVPGX2
Influenza A by PCR: NEGATIVE
Influenza B by PCR: NEGATIVE
Resp Syncytial Virus by PCR: NEGATIVE
SARS Coronavirus 2 by RT PCR: NEGATIVE

## 2022-11-23 LAB — BRAIN NATRIURETIC PEPTIDE: B Natriuretic Peptide: 210.3 pg/mL — ABNORMAL HIGH (ref 0.0–100.0)

## 2022-11-23 LAB — I-STAT VENOUS BLOOD GAS, ED
Acid-Base Excess: 0 mmol/L (ref 0.0–2.0)
Bicarbonate: 24.8 mmol/L (ref 20.0–28.0)
Calcium, Ion: 1.14 mmol/L — ABNORMAL LOW (ref 1.15–1.40)
HCT: 39 % (ref 36.0–46.0)
Hemoglobin: 13.3 g/dL (ref 12.0–15.0)
O2 Saturation: 55 %
Potassium: 3.7 mmol/L (ref 3.5–5.1)
Sodium: 143 mmol/L (ref 135–145)
TCO2: 26 mmol/L (ref 22–32)
pCO2, Ven: 40.6 mmHg — ABNORMAL LOW (ref 44–60)
pH, Ven: 7.395 (ref 7.25–7.43)
pO2, Ven: 29 mmHg — CL (ref 32–45)

## 2022-11-23 LAB — CBC WITH DIFFERENTIAL/PLATELET
Abs Immature Granulocytes: 0.04 10*3/uL (ref 0.00–0.07)
Basophils Absolute: 0.1 10*3/uL (ref 0.0–0.1)
Basophils Relative: 1 %
Eosinophils Absolute: 0.6 10*3/uL — ABNORMAL HIGH (ref 0.0–0.5)
Eosinophils Relative: 6 %
HCT: 39.3 % (ref 36.0–46.0)
Hemoglobin: 12.5 g/dL (ref 12.0–15.0)
Immature Granulocytes: 0 %
Lymphocytes Relative: 11 %
Lymphs Abs: 1 10*3/uL (ref 0.7–4.0)
MCH: 32.1 pg (ref 26.0–34.0)
MCHC: 31.8 g/dL (ref 30.0–36.0)
MCV: 100.8 fL — ABNORMAL HIGH (ref 80.0–100.0)
Monocytes Absolute: 0.7 10*3/uL (ref 0.1–1.0)
Monocytes Relative: 8 %
Neutro Abs: 6.8 10*3/uL (ref 1.7–7.7)
Neutrophils Relative %: 74 %
Platelets: 222 10*3/uL (ref 150–400)
RBC: 3.9 MIL/uL (ref 3.87–5.11)
RDW: 13.4 % (ref 11.5–15.5)
WBC: 9.2 10*3/uL (ref 4.0–10.5)
nRBC: 0 % (ref 0.0–0.2)

## 2022-11-23 LAB — COMPREHENSIVE METABOLIC PANEL
ALT: 19 U/L (ref 0–44)
AST: 26 U/L (ref 15–41)
Albumin: 3.6 g/dL (ref 3.5–5.0)
Alkaline Phosphatase: 109 U/L (ref 38–126)
Anion gap: 8 (ref 5–15)
BUN: 12 mg/dL (ref 8–23)
CO2: 27 mmol/L (ref 22–32)
Calcium: 8.9 mg/dL (ref 8.9–10.3)
Chloride: 107 mmol/L (ref 98–111)
Creatinine, Ser: 0.97 mg/dL (ref 0.44–1.00)
GFR, Estimated: 58 mL/min — ABNORMAL LOW (ref >60)
Glucose, Bld: 101 mg/dL — ABNORMAL HIGH (ref 70–99)
Potassium: 3.7 mmol/L (ref 3.5–5.1)
Sodium: 142 mmol/L (ref 135–145)
Total Bilirubin: 1.4 mg/dL — ABNORMAL HIGH (ref 0.3–1.2)
Total Protein: 7.4 g/dL (ref 6.5–8.1)

## 2022-11-23 LAB — I-STAT CHEM 8, ED
BUN: 13 mg/dL (ref 8–23)
Calcium, Ion: 1.16 mmol/L (ref 1.15–1.40)
Chloride: 107 mmol/L (ref 98–111)
Creatinine, Ser: 0.9 mg/dL (ref 0.44–1.00)
Glucose, Bld: 94 mg/dL (ref 70–99)
HCT: 40 % (ref 36.0–46.0)
Hemoglobin: 13.6 g/dL (ref 12.0–15.0)
Potassium: 3.7 mmol/L (ref 3.5–5.1)
Sodium: 142 mmol/L (ref 135–145)
TCO2: 23 mmol/L (ref 22–32)

## 2022-11-23 LAB — CBC
HCT: 36.7 % (ref 36.0–46.0)
Hemoglobin: 11.6 g/dL — ABNORMAL LOW (ref 12.0–15.0)
MCH: 31.4 pg (ref 26.0–34.0)
MCHC: 31.6 g/dL (ref 30.0–36.0)
MCV: 99.5 fL (ref 80.0–100.0)
Platelets: 206 10*3/uL (ref 150–400)
RBC: 3.69 MIL/uL — ABNORMAL LOW (ref 3.87–5.11)
RDW: 13.2 % (ref 11.5–15.5)
WBC: 10.9 10*3/uL — ABNORMAL HIGH (ref 4.0–10.5)
nRBC: 0 % (ref 0.0–0.2)

## 2022-11-23 LAB — URINALYSIS, W/ REFLEX TO CULTURE (INFECTION SUSPECTED)
Bacteria, UA: NONE SEEN
Bilirubin Urine: NEGATIVE
Glucose, UA: NEGATIVE mg/dL
Ketones, ur: NEGATIVE mg/dL
Leukocytes,Ua: NEGATIVE
Nitrite: NEGATIVE
Protein, ur: NEGATIVE mg/dL
Specific Gravity, Urine: 1.02 (ref 1.005–1.030)
pH: 7 (ref 5.0–8.0)

## 2022-11-23 LAB — URINE CULTURE

## 2022-11-23 LAB — TROPONIN I (HIGH SENSITIVITY)
Troponin I (High Sensitivity): 22 ng/L — ABNORMAL HIGH (ref <18)
Troponin I (High Sensitivity): 22 ng/L — ABNORMAL HIGH (ref <18)

## 2022-11-23 LAB — I-STAT CG4 LACTIC ACID, ED: Lactic Acid, Venous: 1.1 mmol/L (ref 0.5–1.9)

## 2022-11-23 LAB — TSH: TSH: 0.341 u[IU]/mL — ABNORMAL LOW (ref 0.350–4.500)

## 2022-11-23 LAB — MAGNESIUM: Magnesium: 1.9 mg/dL (ref 1.7–2.4)

## 2022-11-23 MED ORDER — DORZOLAMIDE HCL-TIMOLOL MAL 2-0.5 % OP SOLN
1.0000 [drp] | Freq: Three times a day (TID) | OPHTHALMIC | Status: DC
  Administered 2022-11-23 – 2022-11-27 (×11): 1 [drp] via OPHTHALMIC
  Filled 2022-11-23: qty 10

## 2022-11-23 MED ORDER — APIXABAN 5 MG PO TABS
5.0000 mg | ORAL_TABLET | Freq: Two times a day (BID) | ORAL | Status: DC
  Administered 2022-11-23 – 2022-11-26 (×7): 5 mg via ORAL
  Filled 2022-11-23 (×8): qty 1

## 2022-11-23 MED ORDER — ASPIRIN 81 MG PO TBEC
81.0000 mg | DELAYED_RELEASE_TABLET | Freq: Every day | ORAL | Status: DC
  Administered 2022-11-24 – 2022-11-26 (×3): 81 mg via ORAL
  Filled 2022-11-23 (×4): qty 1

## 2022-11-23 MED ORDER — SODIUM CHLORIDE 0.9 % IV SOLN
500.0000 mg | Freq: Once | INTRAVENOUS | Status: AC
  Administered 2022-11-23: 500 mg via INTRAVENOUS
  Filled 2022-11-23: qty 5

## 2022-11-23 MED ORDER — METRONIDAZOLE 500 MG/100ML IV SOLN
500.0000 mg | Freq: Once | INTRAVENOUS | Status: AC
  Administered 2022-11-23: 500 mg via INTRAVENOUS
  Filled 2022-11-23: qty 100

## 2022-11-23 MED ORDER — FENTANYL CITRATE PF 50 MCG/ML IJ SOSY
25.0000 ug | PREFILLED_SYRINGE | Freq: Once | INTRAMUSCULAR | Status: DC
  Filled 2022-11-23: qty 1

## 2022-11-23 MED ORDER — FLEET ENEMA RE ENEM
1.0000 | ENEMA | Freq: Once | RECTAL | Status: AC
  Administered 2022-11-23: 1 via RECTAL
  Filled 2022-11-23: qty 1

## 2022-11-23 MED ORDER — SENNA 8.6 MG PO TABS
2.0000 | ORAL_TABLET | Freq: Every day | ORAL | Status: DC
  Administered 2022-11-23: 17.2 mg via ORAL
  Filled 2022-11-23: qty 2

## 2022-11-23 MED ORDER — FUROSEMIDE 10 MG/ML IJ SOLN
20.0000 mg | Freq: Once | INTRAMUSCULAR | Status: AC
  Administered 2022-11-23: 20 mg via INTRAVENOUS
  Filled 2022-11-23: qty 2

## 2022-11-23 MED ORDER — ACETAMINOPHEN 500 MG PO TABS
1000.0000 mg | ORAL_TABLET | Freq: Four times a day (QID) | ORAL | Status: DC | PRN
  Administered 2022-11-23 – 2022-11-25 (×2): 1000 mg via ORAL
  Filled 2022-11-23 (×2): qty 2

## 2022-11-23 MED ORDER — LEVOTHYROXINE SODIUM 100 MCG PO TABS
100.0000 ug | ORAL_TABLET | Freq: Every day | ORAL | Status: DC
  Administered 2022-11-24 – 2022-11-27 (×4): 100 ug via ORAL
  Filled 2022-11-23 (×4): qty 1

## 2022-11-23 MED ORDER — SERTRALINE HCL 50 MG PO TABS
50.0000 mg | ORAL_TABLET | Freq: Every day | ORAL | Status: DC
  Administered 2022-11-23 – 2022-11-26 (×4): 50 mg via ORAL
  Filled 2022-11-23 (×4): qty 1

## 2022-11-23 MED ORDER — SODIUM CHLORIDE 0.9 % IV SOLN
2.0000 g | Freq: Two times a day (BID) | INTRAVENOUS | Status: DC
  Administered 2022-11-23 – 2022-11-27 (×8): 2 g via INTRAVENOUS
  Filled 2022-11-23 (×8): qty 12.5

## 2022-11-23 MED ORDER — SODIUM CHLORIDE 0.9 % IV SOLN
1.0000 g | Freq: Once | INTRAVENOUS | Status: AC
  Administered 2022-11-23: 1 g via INTRAVENOUS
  Filled 2022-11-23: qty 10

## 2022-11-23 MED ORDER — MIRABEGRON ER 25 MG PO TB24
25.0000 mg | ORAL_TABLET | Freq: Every day | ORAL | Status: DC
  Administered 2022-11-24 – 2022-11-26 (×3): 25 mg via ORAL
  Filled 2022-11-23 (×5): qty 1

## 2022-11-23 MED ORDER — ATORVASTATIN CALCIUM 80 MG PO TABS
80.0000 mg | ORAL_TABLET | Freq: Every day | ORAL | Status: DC
  Administered 2022-11-24 – 2022-11-26 (×3): 80 mg via ORAL
  Filled 2022-11-23 (×4): qty 1

## 2022-11-23 MED ORDER — LACTATED RINGERS IV BOLUS
1000.0000 mL | Freq: Once | INTRAVENOUS | Status: AC
  Administered 2022-11-23: 1000 mL via INTRAVENOUS

## 2022-11-23 MED ORDER — NETARSUDIL DIMESYLATE 0.02 % OP SOLN: 1.0000 [drp] | Freq: Every evening | OPHTHALMIC | Status: DC

## 2022-11-23 NOTE — ED Notes (Signed)
Admitting doctor at the bedside

## 2022-11-23 NOTE — ED Notes (Signed)
The pt did not pass her swallow screen  she  coughed   she is very upset crying because she cannot eat  she has been asking all night for the food

## 2022-11-23 NOTE — ED Notes (Signed)
The pt is alert and oriented now c/o being hungry  she has a pure wcik at home one replaced that one that did not come in with her she is hungry

## 2022-11-23 NOTE — ED Provider Notes (Signed)
[place] [facility]ERGENCY DEPARTMENT AT [facility] Provider Note   CSN: [id number] Arrival date & time: 11/23/22  1614     History  Chief Complaint  Patient presents with   Altered Mental Status    Yvonne Bailey is a 82 y.o. female.  HPI Patient presents for altered mental status.  Medical history includes atrial fibrillation, HTN, HLD, PVD, CVA, CHF CAD, dementia, CKD.  She currently lives at home with family.  Per EMS, she is normally alert and oriented x 4.  She has had a recent change in mentation.  Family reports that this is typical when she gets a UTI.  She was recently seen by her primary care doctor.  They did draw urinalysis but results are pending.  She was started on nitrofurantoin empirically for UTI.  Patient's mentation has worsened.  She reportedly had an unwitnessed fall today.  Patient is unable to provide any history.  History per son: Patient has some changes in behavior earlier in the week which prompted evaluation for possible UTI.  She is currently on day 2 of nitrofurantoin.  Today, while walking, she became very stiff and weak.  He has seen her do this before in the setting of UTI.  She has not been diagnosed with a seizure disorder in the past.    Home Medications Prior to Admission medications   Medication Sig Start Date End Date Taking? Authorizing Provider  aspirin EC 81 MG tablet Take 81 mg by mouth daily. Swallow whole.   Yes [provider]  atorvastatin (LIPITOR) 40 MG tablet Take 1 tablet (40 mg total) by mouth daily. Patient taking differently: Take 80 mg by mouth daily. 07/04/22  Yes Almon Hercules, MD  dorzolamide-timolol (COSOPT) 2-0.5 % ophthalmic solution Place 1 drop into both eyes in the morning, at noon, and at bedtime.   Yes [provider]  ELIQUIS 5 MG TABS tablet TAKE 1 TABLET(5 MG) BY MOUTH TWICE DAILY 11/21/22  Yes Burns, Bobette Mo, MD  levothyroxine (SYNTHROID) 100 MCG tablet Take 100 mcg by mouth daily before  breakfast.   Yes [provider]  MYRBETRIQ 25 MG TB24 tablet TAKE 1 TABLET(25 MG) BY MOUTH DAILY Patient taking differently: Take 25 mg by mouth daily. 04/30/22  Yes Burns, Bobette Mo, MD  Netarsudil Dimesylate (RHOPRESSA) 0.02 % SOLN Place 1 drop into both eyes every evening.   Yes [provider]  nitrofurantoin, macrocrystal-monohydrate, (MACROBID) 100 MG capsule Take 1 capsule (100 mg total) by mouth 2 (two) times daily for 7 days. 11/20/22 11/27/22 Yes Burns, Bobette Mo, MD  nitroGLYCERIN (NITROSTAT) 0.4 MG SL tablet Place 1 tablet (0.4 mg total) under the tongue every 5 (five) minutes as needed for chest pain. 09/08/15  Yes Little Ishikawa, NP  polyethylene glycol (MIRALAX) 17 g packet Take one dose 3 times a day until bowel clear, maximum of 3 consecutive days. Patient taking differently: Take 17 g by mouth daily as needed for mild constipation. 10/30/20  Yes Elpidio Anis, PA-C  sertraline (ZOLOFT) 50 MG tablet Take 1 tablet (50 mg total) by mouth daily. Patient taking differently: Take 50 mg by mouth at bedtime. 07/04/22  Yes Almon Hercules, MD      Allergies    Definity [perflutren lipid microsphere] and Pletal [cilostazol]    Review of Systems   Review of Systems  Unable to perform ROS: Mental status change    Physical Exam Updated Vital Signs BP (!) 142/75   Pulse 83  Temp 97.7 F (36.5 C) (Oral)   Resp (!) 33   Ht 5\' 7"  (1.702 m)   Wt 74.6 kg   SpO2 96%   BMI 25.76 kg/m  Physical Exam Vitals and nursing note reviewed.  Constitutional:      General: She is not in acute distress.    Appearance: Normal appearance. She is well-developed. She is ill-appearing. She is not toxic-appearing or diaphoretic.  HENT:     Head: Normocephalic and atraumatic.     Right Ear: External ear normal.     Left Ear: External ear normal.     Nose: Nose normal.     Mouth/Throat:     Mouth: Mucous membranes are moist.  Eyes:     Extraocular Movements: Extraocular movements  intact.     Conjunctiva/sclera: Conjunctivae normal.  Cardiovascular:     Rate and Rhythm: Normal rate and regular rhythm.     Heart sounds: No murmur heard. Pulmonary:     Effort: Pulmonary effort is normal. No respiratory distress.     Breath sounds: Normal breath sounds. No wheezing or rales.  Chest:     Chest wall: No tenderness.  Abdominal:     General: There is no distension.     Palpations: Abdomen is soft.     Tenderness: There is abdominal tenderness. There is no guarding or rebound.  Musculoskeletal:        General: Tenderness present. No swelling or deformity.     Cervical back: Normal range of motion and neck supple.     Right lower leg: No edema.     Left lower leg: No edema.  Skin:    General: Skin is warm and dry.     Capillary Refill: Capillary refill takes less than 2 seconds.     Coloration: Skin is not jaundiced or pale.  Neurological:     General: No focal deficit present.     Mental Status: She is alert. She is disoriented.  Psychiatric:        Mood and Affect: Mood normal.        Behavior: Behavior normal.     ED Results / Procedures / Treatments   Labs (all labs ordered are listed, but only abnormal results are displayed) Labs Reviewed  COMPREHENSIVE METABOLIC PANEL - Abnormal; Notable for the following components:      Result Value   Glucose, Bld 101 (*)    Total Bilirubin 1.4 (*)    GFR, Estimated 58 (*)    All other components within normal limits  CBC WITH DIFFERENTIAL/PLATELET - Abnormal; Notable for the following components:   MCV 100.8 (*)    Eosinophils Absolute 0.6 (*)    All other components within normal limits  BRAIN NATRIURETIC PEPTIDE - Abnormal; Notable for the following components:   B Natriuretic Peptide 210.3 (*)    All other components within normal limits  URINALYSIS, W/ REFLEX TO CULTURE (INFECTION SUSPECTED) - Abnormal; Notable for the following components:   Hgb urine dipstick MODERATE (*)    All other components within  normal limits  I-STAT VENOUS BLOOD GAS, ED - Abnormal; Notable for the following components:   pCO2, Ven 40.6 (*)    pO2, Ven 29 (*)    Calcium, Ion 1.14 (*)    All other components within normal limits  TROPONIN I (HIGH SENSITIVITY) - Abnormal; Notable for the following components:   Troponin I (High Sensitivity) 22 (*)    All other components within normal limits  CULTURE, BLOOD (  ROUTINE X 2)  CULTURE, BLOOD (ROUTINE X 2)  MAGNESIUM  TSH  CK  I-STAT CG4 LACTIC ACID, ED  I-STAT CHEM 8, ED  I-STAT CG4 LACTIC ACID, ED  TROPONIN I (HIGH SENSITIVITY)    EKG EKG Interpretation Date/Time:  Saturday November 23 2022 16:37:49 EDT Ventricular Rate:  72 PR Interval:    QRS Duration:  106 QT Interval:  422 QTC Calculation: 462 R Axis:   66  Text Interpretation: Atrial fibrillation Anterior infarct, old Confirmed by Gloris Manchester [phone]) on 11/23/2022 4:41:30 PM  Radiology CT CHEST ABDOMEN PELVIS WO CONTRAST  Result Date: 11/23/2022 CLINICAL DATA:  Fall with chest and abdomen pain. EXAM: CT CHEST, ABDOMEN AND PELVIS WITHOUT CONTRAST TECHNIQUE: Multidetector CT imaging of the chest, abdomen and pelvis was performed following the standard protocol without IV contrast. RADIATION DOSE REDUCTION: This exam was performed according to the departmental dose-optimization program which includes automated exposure control, adjustment of the mA and/or kV according to patient size and/or use of iterative reconstruction technique. COMPARISON:  Chest radiograph dated 07/07/2022, CT abdomen and pelvis dated 02/13/2021. FINDINGS: CT CHEST FINDINGS Cardiovascular: Vascular calcifications are seen in the coronary arteries and aortic arch. The heart is enlarged. No pericardial effusion. Mediastinum/Nodes: No enlarged mediastinal, hilar, or axillary lymph nodes. Thyroid gland, trachea, and esophagus demonstrate no significant findings. Lungs/Pleura: There are moderate to large right and small left pleural effusions  with associated atelectasis. There are moderate patchy bilateral apical ground-glass opacities. No pneumothorax. Musculoskeletal: Degenerative changes are seen in the spine. CT ABDOMEN PELVIS FINDINGS Hepatobiliary: No focal liver abnormality is seen. No gallstones, gallbladder wall thickening, or biliary dilatation. Pancreas: Unremarkable. No pancreatic ductal dilatation or surrounding inflammatory changes. Spleen: Normal in size without focal abnormality. Adrenals/Urinary Tract: The left adrenal gland demonstrates mild thickening, unchanged from prior exam. Three calcifications appear to be located in the upper right ureter, measuring up to 4 mm in size, and appear similar to prior exam (series 8 images 84-85). There is a possible distal right ureteral calculus/density (series 5, image 111) which measures 3 mm and was not definitely seen on prior exam. Multiple nonobstructive right renal calculus measure up to 11 x 4 mm in size. Bilateral renal cysts appear unchanged. No calculi on the left. No hydronephrosis on either side. The bladder is unremarkable. Stomach/Bowel: Stomach is within normal limits. There is mild distention of the rectum with stool and mild circumferential wall thickening. There is colonic diverticulosis without evidence of diverticulitis. No evidence of bowel wall thickening, distention, or inflammatory changes. Vascular/Lymphatic: Aortic atherosclerosis. No enlarged abdominal or pelvic lymph nodes. Reproductive: Status post hysterectomy. No adnexal masses. Other: No abdominal wall hernia or abnormality. No abdominopelvic ascites. Musculoskeletal: Degenerative changes are seen in the spine. IMPRESSION: 1. No acute traumatic injury in the chest, abdomen, or pelvis. 2. Moderate to large right and small left pleural effusions with associated atelectasis. Moderate patchy bilateral apical ground-glass opacities may reflect pneumonia. 3. Three calcifications in the right ureter appear similar to prior  exam. One possible distal right ureteral calculus/density was not definitely seen on prior exam. No hydronephrosis on either side. 4. Mild distention of the rectum with stool and mild circumferential wall thickening may reflect stercoral colitis. Aortic Atherosclerosis (ICD10-I70.0). Electronically Signed   By: Romona Curls M.D.   On: 11/23/2022 18:01   CT Head Wo Contrast  Result Date: 11/23/2022 CLINICAL DATA:  Fall, head trauma EXAM: CT HEAD WITHOUT CONTRAST CT CERVICAL SPINE WITHOUT CONTRAST TECHNIQUE: Multidetector CT imaging of the  head and cervical spine was performed following the standard protocol without intravenous contrast. Multiplanar CT image reconstructions of the cervical spine were also generated. RADIATION DOSE REDUCTION: This exam was performed according to the departmental dose-optimization program which includes automated exposure control, adjustment of the mA and/or kV according to patient size and/or use of iterative reconstruction technique. COMPARISON:  CT head dated 07/26/2022 FINDINGS: CT HEAD FINDINGS Brain: No evidence of acute infarction, hemorrhage, hydrocephalus, extra-axial collection or mass lesion/mass effect. 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 Subcortical white matter and periventricular small vessel ischemic changes. Vascular: Intracranial atherosclerosis. Skull: Normal. Negative for fracture or focal lesion. Sinuses/Orbits: The visualized paranasal sinuses are essentially clear. The mastoid air cells are unopacified. Other: None. CT CERVICAL SPINE FINDINGS Alignment: Normal cervical lordosis. Skull base and vertebrae: No acute fracture. No primary bone lesion or focal pathologic process. Soft tissues and spinal canal: No prevertebral fluid or swelling. No visible canal hematoma. Disc levels: Mild degenerative changes of the mid cervical spine. Spinal canal is patent. Upper chest: Evaluated on dedicated CT chest. Other: None. IMPRESSION: No  acute intracranial abnormality. 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 Small vessel ischemic changes. No traumatic injury to the cervical spine. Mild degenerative changes. Electronically Signed   By: Charline Bills M.D.   On: 11/23/2022 17:44   CT Cervical Spine Wo Contrast  Result Date: 11/23/2022 CLINICAL DATA:  Fall, head trauma EXAM: CT HEAD WITHOUT CONTRAST CT CERVICAL SPINE WITHOUT CONTRAST TECHNIQUE: Multidetector CT imaging of the head and cervical spine was performed following the standard protocol without intravenous contrast. Multiplanar CT image reconstructions of the cervical spine were also generated. RADIATION DOSE REDUCTION: This exam was performed according to the departmental dose-optimization program which includes automated exposure control, adjustment of the mA and/or kV according to patient size and/or use of iterative reconstruction technique. COMPARISON:  CT head dated 07/26/2022 FINDINGS: CT HEAD FINDINGS Brain: No evidence of acute infarction, hemorrhage, hydrocephalus, extra-axial collection or mass lesion/mass effect. 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 Subcortical white matter and periventricular small vessel ischemic changes. Vascular: Intracranial atherosclerosis. Skull: Normal. Negative for fracture or focal lesion. Sinuses/Orbits: The visualized paranasal sinuses are essentially clear. The mastoid air cells are unopacified. Other: None. CT CERVICAL SPINE FINDINGS Alignment: Normal cervical lordosis. Skull base and vertebrae: No acute fracture. No primary bone lesion or focal pathologic process. Soft tissues and spinal canal: No prevertebral fluid or swelling. No visible canal hematoma. Disc levels: Mild degenerative changes of the mid cervical spine. Spinal canal is patent. Upper chest: Evaluated on dedicated CT chest. Other: None. IMPRESSION: No acute intracranial abnormality. Old left parietal infarct. Old right  basal ganglia lacunar infarct. Small vessel ischemic changes. No traumatic injury to the cervical spine. Mild degenerative changes. Electronically Signed   By: Charline Bills M.D.   On: 11/23/2022 17:44    Procedures Procedures    Medications Ordered in ED Medications  fentaNYL (SUBLIMAZE) injection 25 mcg (25 mcg Intravenous Not Given 11/23/22 1812)  metroNIDAZOLE (FLAGYL) IVPB 500 mg (has no administration in time range)  cefTRIAXone (ROCEPHIN) 1 g in sodium chloride 0.9 % 100 mL IVPB (0 g Intravenous Stopped 11/23/22 1817)  lactated ringers bolus 1,000 mL (0 mLs Intravenous Stopped 11/23/22 1922)  azithromycin (ZITHROMAX) 500 mg in sodium chloride 0.9 % 250 mL IVPB (500 mg Intravenous New Bag/Given 11/23/22 1921)  sodium phosphate (FLEET) enema 1 enema (1 enema Rectal Given 11/23/22 2000)  ED Course/ Medical Decision Making/ A&P                                 Medical Decision Making Amount and/or Complexity of Data Reviewed Labs: ordered. Radiology: ordered. ECG/medicine tests: ordered.  Risk OTC drugs. Prescription drug management. Decision regarding hospitalization.   This patient presents to the ED for concern of altered mental status, this involves an extensive number of treatment options, and is a complaint that carries with it a high risk of complications and morbidity.  The differential diagnosis includes infection, head trauma, CVA, polypharmacy, metabolic derangements   Co morbidities that complicate the patient evaluation  atrial fibrillation, HTN, HLD, PVD, CVA, CHF CAD, dementia, CKD   Additional history obtained:  Additional history obtained from EMS External records from outside source obtained and reviewed including EMR   Lab Tests:  I Ordered, and personally interpreted labs.  The pertinent results include: Normal hemoglobin, no leukocytosis, normal kidney function, normal electrolytes, mild elevation in BNP and troponin   Imaging Studies  ordered:  I ordered imaging studies including CT of head, cervical spine, chest, abdomen, pelvis I independently visualized and interpreted imaging which showed no acute injuries; there was a moderate to large right pleural effusion with some patchy biapical groundglass opacities concerning for pneumonia; chronic kidney stones and right ureter without upstream hydronephrosis were identified.  There was also mild distention of rectum with wall thickening concerning for possible stercoral colitis. I agree with the radiologist interpretation   Cardiac Monitoring: / EKG:  The patient was maintained on a cardiac monitor.  I personally viewed and interpreted the cardiac monitored which showed an underlying rhythm of: atrial fibrillation   Consultations Obtained:  I requested consultation with the urologist, Dr. Marlou Porch,  and discussed lab and imaging findings as well as pertinent plan - they recommend: No urology interventions   Problem List / ED Course / Critical interventions / Medication management  Patient presents for altered mental status. Initial history was provided by EMS.  She is reportedly alert and oriented x 4 at baseline.  She has had a recent decline in her mental status.  She reportedly had an unwitnessed fall today.  On arrival, she is unable to provide history.  She is unable/unwilling to tell me her name.  She will wake up and interact nonverbally with physical stimuli.  She seems to have abdominal and CVA tenderness.  She also has tenderness in her thigh muscles.  She has no evidence of open wounds.  She was recently seen by PCP and started on nitrofurantoin for empiric treatment of UTI.  Per chart review, urinalysis was obtained 3 days ago.  Cultures grew pansensitive E. coli.  Ceftriaxone IV fluids were ordered.  Patient to undergo pan scan to assess for traumatic injuries from unwitnessed fall.  I am also concerned about obstructive uropathy given her decline despite antibiotics.   Fentanyl was ordered for analgesia.  When patient returned from CT scan, she was alert and oriented.  She declined pain medication.  She refused In-N-Out catheter.  CT scan showed findings concerning for pneumonia as well as stercoral colitis.  Azithromycin and Flagyl were added to antibiotics.  On DRE, there was no impacted stool within reach.  Patient underwent Fleet enema.  I spoke with urologist, Dr. Marlou Porch, who reviewed CT imaging.  He feels that infected stone is unlikely given normal urine today and presence of alternative sources of  her encephalopathy.  Patient was admitted for further management. I ordered medication including IV fluids for hydration; ceftriaxone and azithromycin for empiric treatment of pneumonia; Flagyl for possible strep colitis; fentanyl for analgesia; Fleet enema for constipation Reevaluation of the patient after these medicines showed that the patient improved I have reviewed the patients home medicines and have made adjustments as needed   Social Determinants of Health:  Lives at home with family         Final Clinical Impression(s) / ED Diagnoses Final diagnoses:  Pleural effusion  Pneumonia of both lower lobes due to infectious organism  Delirium  Stercoral colitis    Rx / DC Orders ED Discharge Orders     None         Gloris Manchester, MD 11/23/22 2025

## 2022-11-23 NOTE — ED Notes (Signed)
The ed doctor has talked to a son that lives at home with her but he has not shown up or called about her

## 2022-11-23 NOTE — ED Notes (Signed)
Report called to the nurse on 43m  and I will be taking her up

## 2022-11-23 NOTE — H&P (Addendum)
History and Physical    Yvonne Bailey VWU:[id number] DOB: 1941/02/06 DOA: 11/23/2022  PCP: Pincus Sanes, MD   Patient coming from: Home   Chief Complaint:  Chief Complaint  Patient presents with   Altered Mental Status    HPI:  Yvonne Bailey is a 82 y.o. female with hx of dementia, CVA, CAD, HFrEF, Afib and PE on AC, CKD stage 3, Hypothyroidism, HTN, HLD, pre-DM, who presents after episode of altered mental status and ground level fall. Hx limited due to history of dementia. Reports she was in the bathroom and son trying to help her off toilet but she fell onto the ground. Denies any injuries. Endorses global weakness. Otherwise with recent dysuria. Seen by PCP and prescribed course of Macrobid, has completed 3 days. No other fever, chills, cough/cold, abd pain, n/v/d, rashes.   Review of Systems:  ROS complete and negative except as marked above   Allergies  Allergen Reactions   Definity [Perflutren Lipid Microsphere] Other (See Comments)    Patient experienced back pain with injection   Pletal [Cilostazol] Other (See Comments)    Made pt "feel funny"    Prior to Admission medications   Medication Sig Start Date End Date Taking? Authorizing Provider  aspirin EC 81 MG tablet Take 81 mg by mouth daily. Swallow whole.   Yes [provider]  atorvastatin (LIPITOR) 40 MG tablet Take 1 tablet (40 mg total) by mouth daily. Patient taking differently: Take 80 mg by mouth daily. 07/04/22  Yes Almon Hercules, MD  dorzolamide-timolol (COSOPT) 2-0.5 % ophthalmic solution Place 1 drop into both eyes in the morning, at noon, and at bedtime.   Yes [provider]  ELIQUIS 5 MG TABS tablet TAKE 1 TABLET(5 MG) BY MOUTH TWICE DAILY 11/21/22  Yes Burns, Bobette Mo, MD  levothyroxine (SYNTHROID) 100 MCG tablet Take 100 mcg by mouth daily before breakfast.   Yes [provider]  MYRBETRIQ 25 MG TB24 tablet TAKE 1 TABLET(25 MG) BY MOUTH DAILY Patient taking  differently: Take 25 mg by mouth daily. 04/30/22  Yes Burns, Bobette Mo, MD  Netarsudil Dimesylate (RHOPRESSA) 0.02 % SOLN Place 1 drop into both eyes every evening.   Yes [provider]  nitrofurantoin, macrocrystal-monohydrate, (MACROBID) 100 MG capsule Take 1 capsule (100 mg total) by mouth 2 (two) times daily for 7 days. 11/20/22 11/27/22 Yes Burns, Bobette Mo, MD  nitroGLYCERIN (NITROSTAT) 0.4 MG SL tablet Place 1 tablet (0.4 mg total) under the tongue every 5 (five) minutes as needed for chest pain. 09/08/15  Yes Little Ishikawa, NP  polyethylene glycol (MIRALAX) 17 g packet Take one dose 3 times a day until bowel clear, maximum of 3 consecutive days. Patient taking differently: Take 17 g by mouth daily as needed for mild constipation. 10/30/20  Yes Elpidio Anis, PA-C  sertraline (ZOLOFT) 50 MG tablet Take 1 tablet (50 mg total) by mouth daily. Patient taking differently: Take 50 mg by mouth at bedtime. 07/04/22  Yes Almon Hercules, MD    Past Medical History:  Diagnosis Date   Bilateral leg edema 07/19/2015   CAD (coronary artery disease)    a. anterior MI s/p PCI in 1992. b. cath 08/2015 with severe three-vessel CAD turned down for CABG and underwent DESx5 to prox Cx/OM2/D1/oRCA/mRCA   Carotid artery disease (HCC)    a. carotid duplex 03/2015 showed 1-39% BICA, normal subclavian arteries, chronically occluded left vertebral, f/u recommended only PRN.   DVT (deep venous thrombosis) (  HCC)    X1   History of nuclear stress test    a. Myoview 6/17: EF 20-25%, mid anteroseptal, apical anterior, apical septal, apical inferior, apical lateral and apical scar, no ischemia, intermediate risk   HTN (hypertension)    Hyperlipidemia    Hypokalemia    Hypothyroidism    Ischemic cardiomyopathy    a. Echo 6/17: EF 20-25%, apex appears akinetic, MAC, moderate MR, moderate LAE, mild RVE, trivial PI, PASP 47 mmHg (needs repeat with Definity contrast).  b. Limited echo with Definity contrast 7/17: EF  25-30%, moderate to severe LAE. c. Limited Echo 2018 showed EF 40-45%.   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 02/2017 in setting of noncompliance with Eliquis   TIA (transient ischemic attack)    Tricuspid regurgitation     Past Surgical History:  Procedure Laterality Date   APPENDECTOMY     at hysterectomy and USO for fibroids, Dr. Kizzie Bane   CARDIAC CATHETERIZATION  1992   Dr Charlies Constable   CARDIAC CATHETERIZATION N/A 09/06/2015   Procedure: Right/Left Heart Cath and Coronary Angiography;  Surgeon: Kathleene Hazel, MD;  Location: [facility] INVASIVE CV LAB;  Service: Cardiovascular;  Laterality: N/A;   CARDIAC CATHETERIZATION N/A 09/07/2015   Procedure: Coronary Stent Intervention;  Surgeon: Kathleene Hazel, MD;  Location: [facility] INVASIVE CV LAB;  Service: Cardiovascular;  Laterality: N/A;   COLONOSCOPY     negative; 2008, Dr. Lina Sar   fracture LLE     '94; pinned   IR ANGIO INTRA EXTRACRAN SEL COM CAROTID INNOMINATE BILAT MOD SED  03/02/2017   IR ANGIO INTRA EXTRACRAN SEL COM CAROTID INNOMINATE BILAT MOD SED  04/23/2018   IR ANGIO VERTEBRAL SEL SUBCLAVIAN INNOMINATE BILAT MOD SED  04/23/2018   IR ANGIO VERTEBRAL SEL VERTEBRAL UNI R MOD SED  03/02/2017   IR RADIOLOGIST EVAL & MGMT  04/28/2017   IR US GUIDE VASC ACCESS RIGHT  04/23/2018   RADIOLOGY WITH ANESTHESIA N/A 03/02/2017   Procedure: RADIOLOGY WITH ANESTHESIA;  Surgeon: Julieanne Cotton, MD;  Location: [facility] OR;  Service: Radiology;  Laterality: N/A;   TEE WITHOUT CARDIOVERSION N/A 11/02/2020   Procedure: TRANSESOPHAGEAL ECHOCARDIOGRAM (TEE);  Surgeon: Thurmon Fair, MD;   Location: [facility] ENDOSCOPY;  Service: Cardiovascular;  Laterality: N/A;   TONSILLECTOMY     TOTAL ABDOMINAL HYSTERECTOMY     & BSO for fibroids     reports that she quit smoking about 32 years ago. Her smoking use included cigarettes. She has never used smokeless tobacco. She reports that she does not drink alcohol and does not use drugs.  Family History  Problem Relation Age of Onset   Diabetes Mother    Hypertension Mother    Stroke Brother        ?> 58   Coronary artery disease Brother        stent in 62s   Cancer Neg Hx      Physical Exam: Vitals:   11/23/22 1816 11/23/22 1823 11/23/22 1900 11/23/22 1945  BP:  (!) 179/105 (!) 173/107 (!) 142/75  Pulse:  86 100 83  Resp:  Marland Kitchen)  24 (!) 27 (!) 33  Temp: 97.7 F (36.5 C)     TempSrc: Oral     SpO2:  98% 98% 96%  Weight:      Height:        Gen: Awake, alert, NAD, Chronically ill appearing  CV: Regular, normal S1, S2, no murmurs  Resp: Normal WOB, decreased breath sounds R posterior. Scattered rales  Abd: Flat, normoactive, Moderate R CVAT, RLQ tenderness, no rebound, guarding, rigidity  MSK: Symmetric, 1-2+ pitting edema to mid shin. Moderate tenderness both anterior thighs, no deformity, able to range without significant pain Skin: No rashes or lesions to exposed skin  Neuro: Alert and interactive, A+O x self, [facility], not to time.  Psych: Confused   Data review:   Labs reviewed, notable for:   Lactate 1  VBG 7.39 / 40  T Bili 1.4  WBC 9  BNP 210  HS trop 22  UA grossly c/w infection   Micro:  Results for orders placed or performed in visit on 11/19/22  Urine Culture     Status: Abnormal   Collection Time: 11/20/22  2:23 PM   Specimen: Urine  Result Value Ref Range Status   Source: URINE  Final   Status: FINAL  Final   Isolate 1: Escherichia coli (A)  Final    Comment: Greater than 100,000 CFU/mL of Escherichia coli      Susceptibility   Escherichia coli - URINE CULTURE, REFLEX     AMOX/CLAVULANIC 4 Sensitive     AMPICILLIN 4 Sensitive     AMPICILLIN/SULBACTAM <=2 Sensitive     CEFAZOLIN* <=4 Not Reportable      * For infections other than uncomplicated UTI caused by E. coli, K. pneumoniae or P. mirabilis: Cefazolin is resistant if MIC > or = 8 mcg/mL. (Distinguishing susceptible versus intermediate for isolates with MIC < or = 4 mcg/mL requires additional testing.) For uncomplicated UTI caused by E. coli, K. pneumoniae or P. mirabilis: Cefazolin is susceptible if MIC <32 mcg/mL and predicts susceptible to the oral agents cefaclor, cefdinir, cefpodoxime, cefprozil, cefuroxime, cephalexin and loracarbef.     CEFTAZIDIME <=1 Sensitive     CEFEPIME <=1 Sensitive     CEFTRIAXONE <=1 Sensitive     CIPROFLOXACIN <=0.25 Sensitive     LEVOFLOXACIN <=0.12 Sensitive     GENTAMICIN <=1 Sensitive     IMIPENEM <=0.25 Sensitive     NITROFURANTOIN <=16 Sensitive     PIP/TAZO <=4 Sensitive     TOBRAMYCIN <=1 Sensitive     TRIMETH/SULFA* <=20 Sensitive      * For infections other than uncomplicated UTI caused by E. coli, K. pneumoniae or P. mirabilis: Cefazolin is resistant if MIC > or = 8 mcg/mL. (Distinguishing susceptible versus intermediate for isolates with MIC < or = 4 mcg/mL requires additional testing.) For uncomplicated UTI caused by E. coli, K. pneumoniae or P. mirabilis: Cefazolin is susceptible if MIC <32 mcg/mL and predicts susceptible to the oral agents cefaclor, cefdinir, cefpodoxime, cefprozil, cefuroxime, cephalexin and loracarbef. Legend: S = Susceptible  I = Intermediate R = Resistant  NS = Not susceptible SDD = Susceptible Dose Dependent * = Not Tested  NR = Not Reported **NN = See Therapy Comments     Imaging reviewed:  CT CHEST ABDOMEN PELVIS WO CONTRAST  Result Date: 11/23/2022 CLINICAL DATA:  Fall with chest and abdomen pain. EXAM: CT CHEST, ABDOMEN AND PELVIS WITHOUT CONTRAST TECHNIQUE: Multidetector CT imaging of the chest, abdomen  and pelvis was  performed following the standard protocol without IV contrast. RADIATION DOSE REDUCTION: This exam was performed according to the departmental dose-optimization program which includes automated exposure control, adjustment of the mA and/or kV according to patient size and/or use of iterative reconstruction technique. COMPARISON:  Chest radiograph dated 07/07/2022, CT abdomen and pelvis dated 02/13/2021. FINDINGS: CT CHEST FINDINGS Cardiovascular: Vascular calcifications are seen in the coronary arteries and aortic arch. The heart is enlarged. No pericardial effusion. Mediastinum/Nodes: No enlarged mediastinal, hilar, or axillary lymph nodes. Thyroid gland, trachea, and esophagus demonstrate no significant findings. Lungs/Pleura: There are moderate to large right and small left pleural effusions with associated atelectasis. There are moderate patchy bilateral apical ground-glass opacities. No pneumothorax. Musculoskeletal: Degenerative changes are seen in the spine. CT ABDOMEN PELVIS FINDINGS Hepatobiliary: No focal liver abnormality is seen. No gallstones, gallbladder wall thickening, or biliary dilatation. Pancreas: Unremarkable. No pancreatic ductal dilatation or surrounding inflammatory changes. Spleen: Normal in size without focal abnormality. Adrenals/Urinary Tract: The left adrenal gland demonstrates mild thickening, unchanged from prior exam. Three calcifications appear to be located in the upper right ureter, measuring up to 4 mm in size, and appear similar to prior exam (series 8 images 84-85). There is a possible distal right ureteral calculus/density (series 5, image 111) which measures 3 mm and was not definitely seen on prior exam. Multiple nonobstructive right renal calculus measure up to 11 x 4 mm in size. Bilateral renal cysts appear unchanged. No calculi on the left. No hydronephrosis on either side. The bladder is unremarkable. Stomach/Bowel: Stomach is within normal limits. There is  mild distention of the rectum with stool and mild circumferential wall thickening. There is colonic diverticulosis without evidence of diverticulitis. No evidence of bowel wall thickening, distention, or inflammatory changes. Vascular/Lymphatic: Aortic atherosclerosis. No enlarged abdominal or pelvic lymph nodes. Reproductive: Status post hysterectomy. No adnexal masses. Other: No abdominal wall hernia or abnormality. No abdominopelvic ascites. Musculoskeletal: Degenerative changes are seen in the spine. IMPRESSION: 1. No acute traumatic injury in the chest, abdomen, or pelvis. 2. Moderate to large right and small left pleural effusions with associated atelectasis. Moderate patchy bilateral apical ground-glass opacities may reflect pneumonia. 3. Three calcifications in the right ureter appear similar to prior exam. One possible distal right ureteral calculus/density was not definitely seen on prior exam. No hydronephrosis on either side. 4. Mild distention of the rectum with stool and mild circumferential wall thickening may reflect stercoral colitis. Aortic Atherosclerosis (ICD10-I70.0). Electronically Signed   By: Romona Curls M.D.   On: 11/23/2022 18:01   CT Head Wo Contrast  Result Date: 11/23/2022 CLINICAL DATA:  Fall, head trauma EXAM: CT HEAD WITHOUT CONTRAST CT CERVICAL SPINE WITHOUT CONTRAST TECHNIQUE: Multidetector CT imaging of the head and cervical spine was performed following the standard protocol without intravenous contrast. Multiplanar CT image reconstructions of the cervical spine were also generated. RADIATION DOSE REDUCTION: This exam was performed according to the departmental dose-optimization program which includes automated exposure control, adjustment of the mA and/or kV according to patient size and/or use of iterative reconstruction technique. COMPARISON:  CT head dated 07/26/2022 FINDINGS: CT HEAD FINDINGS Brain: No evidence of acute infarction, hemorrhage, hydrocephalus, extra-axial  collection or mass lesion/mass effect. 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 Subcortical white matter and periventricular small vessel ischemic changes. Vascular: Intracranial atherosclerosis. Skull: Normal. Negative for fracture or focal lesion. Sinuses/Orbits: The visualized paranasal sinuses are essentially clear. The mastoid air cells are unopacified. Other: None. CT CERVICAL SPINE FINDINGS Alignment:  Normal cervical lordosis. Skull base and vertebrae: No acute fracture. No primary bone lesion or focal pathologic process. Soft tissues and spinal canal: No prevertebral fluid or swelling. No visible canal hematoma. Disc levels: Mild degenerative changes of the mid cervical spine. Spinal canal is patent. Upper chest: Evaluated on dedicated CT chest. Other: None. IMPRESSION: No acute intracranial abnormality. 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 Small vessel ischemic changes. No traumatic injury to the cervical spine. Mild degenerative changes. Electronically Signed   By: Charline Bills M.D.   On: 11/23/2022 17:44   CT Cervical Spine Wo Contrast  Result Date: 11/23/2022 CLINICAL DATA:  Fall, head trauma EXAM: CT HEAD WITHOUT CONTRAST CT CERVICAL SPINE WITHOUT CONTRAST TECHNIQUE: Multidetector CT imaging of the head and cervical spine was performed following the standard protocol without intravenous contrast. Multiplanar CT image reconstructions of the cervical spine were also generated. RADIATION DOSE REDUCTION: This exam was performed according to the departmental dose-optimization program which includes automated exposure control, adjustment of the mA and/or kV according to patient size and/or use of iterative reconstruction technique. COMPARISON:  CT head dated 07/26/2022 FINDINGS: CT HEAD FINDINGS Brain: No evidence of acute infarction, hemorrhage, hydrocephalus, extra-axial collection or mass lesion/mass effect. Old left parietal infarct. Old  right basal ganglia lacunar infarct. Subcortical white matter and periventricular small vessel ischemic changes. Vascular: Intracranial atherosclerosis. Skull: Normal. Negative for fracture or focal lesion. Sinuses/Orbits: The visualized paranasal sinuses are essentially clear. The mastoid air cells are unopacified. Other: None. CT CERVICAL SPINE FINDINGS Alignment: Normal cervical lordosis. Skull base and vertebrae: No acute fracture. No primary bone lesion or focal pathologic process. Soft tissues and spinal canal: No prevertebral fluid or swelling. No visible canal hematoma. Disc levels: Mild degenerative changes of the mid cervical spine. Spinal canal is patent. Upper chest: Evaluated on dedicated CT chest. Other: None. IMPRESSION: No acute intracranial abnormality. 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 Small vessel ischemic changes. No traumatic injury to the cervical spine. Mild degenerative changes. Electronically Signed   By: Charline Bills M.D.   On: 11/23/2022 17:44    EKG:  EKG reviewed, Afib with PRWP, no acute ischemic changes    ED Course:  Treated with 1 L IVF, Ceftriaxone, Flagyl, Azithromycin     Assessment/Plan:  82 y.o. female with hx of dementia, CVA, CAD, HFrEF, Afib and PE on AC, CKD stage 3, Hypothyroidism, HTN, HLD, pre-DM, who presents after episode of altered mental status and ground level fall.    Delirium, Encephalopathy, metabolic likely due to complicated UTI  Concern for possible infected stone Exam with R CVAT. CT with multiple ureteral calcifications on R mostly stable from '22, With one distal not definitely seen prior. Micro review with recurrent UTI 6 episodes in past year. OP Cx 9/11 with pansensitive E Coli. No improvement despite 3 days macrobid outpatient. - ED discussed with Urology, Dr. Cordella Register who does not feel she requires any urologic intervention.  - s/p Ceftriaxone in ED, switch to Cefepime 2 g IV q 12 hr  - Add on  urine culture, BCx are pending   Ground level fall, question unwitnessed  CT Head and C spine without acute abnormality.  - Check CK with thigh tenderness and unknown down time  - XR Hip / femur bilateral  - PT evaluation   Apical GGO and pleural effusion  Suspect exacerbation HFrEF  Less likely aspiration pneumonia, viral pna  CT chest with mod-large R and small  L pleural effusion, patchy apical ground glass opacites. BNP 210. Exam with peripheral edema. Otherwise risk of aspiration with delirium and hx dementia, bilateral apical pattern atypical, consider viral.  - S/p CTX, Azithromycin in ED. Hold on targeted Abx for pneumonia, think more likely HF  - Antibiotic coverage per above  - Check respiratory pathogen panel, respiratory culture  - Diuresis with Lasix 20 mg IV  - TTE  - Daily weights, I/O  Acute myocardial injury  HX CAD  EKG with no acute ischemic changes. HS trop 22. Suspect demand in setting of infection above  - Repeat troponin  - Tele monitoring  - Continue home aspirin, statin  - Check lipids, a1c   Elevated Bilirubin  - Repeat LFT, if worsening RUQ [place]   Fecal impaction  - s/p disimpaction in the ED  - senna nightly   Chronic medical problems:  Afib, PE: Continue apixaban  HX CVA: aspirin, statin per above   Dementia: Delirium precautions  Hypothyroidism: Check TSH, continue home Levothyroxine  ?Urge incontinence: Home Myrbetriq if on formulary  Mood d/o: Continue home Sertraline    Body mass index is 25.76 kg/m.    DVT prophylaxis:   On therapeutic AC  Code Status:   Presumed Full Diet:  Diet Orders (From admission, onward)     Start     Ordered   11/23/22 2041  Diet Heart Room service appropriate? Yes; Fluid consistency: Thin  Diet effective now       Question Answer Comment  Room service appropriate? Yes   Fluid consistency: Thin      11/23/22 2042           Family Communication:  Attempted to call son x 2, no answer   Consults:  ED  discussed with Urology   Admission status:   Observation, Telemetry bed  Severity of Illness: The appropriate patient status for this patient is OBSERVATION. Observation status is judged to be reasonable and necessary in order to provide the required intensity of service to ensure the patient's safety. The patient's presenting symptoms, physical exam findings, and initial radiographic and laboratory data in the context of their medical condition is felt to place them at decreased risk for further clinical deterioration. Furthermore, it is anticipated that the patient will be medically stable for discharge from the hospital within 2 midnights of admission.    Dolly Rias, MD [facility]  How to contact the [facility] Attending or Consulting provider 7A - 7P or covering provider during after hours 7P -7A, for this patient.  Check the care team in [facility] and look for a) attending/consulting [facility] provider listed and b) the [facility] team listed Log into [email or web address] and use [place]'s universal password to access. If you do not have the password, please contact the hospital operator. Locate the [facility] provider you are looking for under [facility] and page to a number that you can be directly reached. If you still have difficulty reaching the provider, please page the [facility] (Director on Call) for the Hospitalists listed on amion for assistance.  11/23/2022, 8:43 PM

## 2022-11-23 NOTE — ED Notes (Signed)
The pt wants to eat she  says that she  has not had food today

## 2022-11-23 NOTE — ED Notes (Signed)
ED TO INPATIENT HANDOFF REPORT  ED Nurse Name and Phone #: chris [phone]  S Name/Age/Gender Yvonne Bailey 82 y.o. female Room/Bed: 001C/001C  Code Status   Code Status: Full Code  Home/SNF/Other Home Patient oriented to: self, place, time, and situation Is this baseline? Yes   Triage Complete: Triage complete  Chief Complaint Complicated UTI (urinary tract infection) [N39.0]  Triage Note The pt was checked in by another rn  the pt came by gems from home  fell today baseline a and o  responds to pain only  gcs 9   on arrival  poss kidney stone  in the pasrt few days   Allergies Allergies  Allergen Reactions   Definity [Perflutren Lipid Microsphere] Other (See Comments)    Patient experienced back pain with injection   Pletal [Cilostazol] Other (See Comments)    Made pt "feel funny"    Level of Care/Admitting Diagnosis ED Disposition     ED Disposition  Admit   Condition  --   Comment  Hospital Area: [facility] [[id number]]  Level of Care: Telemetry Medical [104]  May place patient in observation at [facility] or [facility] if equivalent level of care is available:: Yes  Covid Evaluation: Asymptomatic - no recent exposure (last 10 days) testing not required  Diagnosis: Complicated UTI (urinary tract infection) [841324]  Admitting Physician: Dolly Rias [[id number]]  Attending Physician: Dolly Rias [[id number]]          B Medical/Surgery History Past Medical History:  Diagnosis Date   Bilateral leg edema 07/19/2015   CAD (coronary artery disease)    a. anterior MI s/p PCI in 1992. b. cath 08/2015 with severe three-vessel CAD turned down for CABG and underwent DESx5 to prox Cx/OM2/D1/oRCA/mRCA   Carotid artery disease (HCC)    a. carotid duplex 03/2015 showed 1-39% BICA, normal subclavian arteries, chronically occluded left vertebral, f/u recommended only PRN.   DVT (deep venous thrombosis) (HCC)    X1   History of nuclear stress  test    a. Myoview 6/17: EF 20-25%, mid anteroseptal, apical anterior, apical septal, apical inferior, apical lateral and apical scar, no ischemia, intermediate risk   HTN (hypertension)    Hyperlipidemia    Hypokalemia    Hypothyroidism    Ischemic cardiomyopathy    a. Echo 6/17: EF 20-25%, apex appears akinetic, MAC, moderate MR, moderate LAE, mild RVE, trivial PI, PASP 47 mmHg (needs repeat with Definity contrast).  b. Limited echo with Definity contrast 7/17: EF 25-30%, moderate to severe LAE. c. Limited Echo 2018 showed EF 40-45%.   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 02/2017 in setting of noncompliance with Eliquis   TIA (transient ischemic attack)    Tricuspid regurgitation    Past Surgical History:  Procedure Laterality Date   APPENDECTOMY     at hysterectomy and USO for fibroids, Dr. Kizzie Bane   CARDIAC CATHETERIZATION  1992   Dr Charlies Constable   CARDIAC CATHETERIZATION N/A 09/06/2015   Procedure: Right/Left Heart Cath and Coronary Angiography;  Surgeon: Kathleene Hazel, MD;  Location: [facility] INVASIVE CV  LAB;  Service: Cardiovascular;  Laterality: N/A;   CARDIAC CATHETERIZATION N/A 09/07/2015   Procedure: Coronary Stent Intervention;  Surgeon: Kathleene Hazel, MD;  Location: [facility] INVASIVE CV LAB;  Service: Cardiovascular;  Laterality: N/A;   COLONOSCOPY     negative; 2008, Dr. Lina Sar   fracture LLE     '94; pinned   IR ANGIO INTRA EXTRACRAN SEL COM CAROTID INNOMINATE BILAT MOD SED  03/02/2017   IR ANGIO INTRA EXTRACRAN SEL COM CAROTID INNOMINATE BILAT MOD SED  04/23/2018   IR ANGIO VERTEBRAL SEL SUBCLAVIAN INNOMINATE  BILAT MOD SED  04/23/2018   IR ANGIO VERTEBRAL SEL VERTEBRAL UNI R MOD SED  03/02/2017   IR RADIOLOGIST EVAL & MGMT  04/28/2017   IR US GUIDE VASC ACCESS RIGHT  04/23/2018   RADIOLOGY WITH ANESTHESIA N/A 03/02/2017   Procedure: RADIOLOGY WITH ANESTHESIA;  Surgeon: Julieanne Cotton, MD;  Location: [facility] OR;  Service: Radiology;  Laterality: N/A;   TEE WITHOUT CARDIOVERSION N/A 11/02/2020   Procedure: TRANSESOPHAGEAL ECHOCARDIOGRAM (TEE);  Surgeon: Thurmon Fair, MD;  Location: [facility] ENDOSCOPY;  Service: Cardiovascular;  Laterality: N/A;   TONSILLECTOMY     TOTAL ABDOMINAL HYSTERECTOMY     & BSO for fibroids     A IV Location/Drains/Wounds Patient Lines/Drains/Airways Status     Active Line/Drains/Airways     Name Placement date Placement time Site Days   Peripheral IV 11/23/22 20 G Left Antecubital 11/23/22  1600  Antecubital  less than 1   External Urinary Catheter 11/23/22  1756  --  less than 1            Intake/Output Last 24 hours No intake or output data in the 24 hours ending 11/23/22 2116  Labs/Imaging Results for orders placed or performed during the hospital encounter of 11/23/22 (from the past 48 hour(s))  Comprehensive metabolic panel     Status: Abnormal   Collection Time: 11/23/22  4:57 PM  Result Value Ref Range   Sodium 142 135 - 145 mmol/L   Potassium 3.7 3.5 - 5.1 mmol/L   Chloride 107 98 - 111 mmol/L   CO2 27 22 - 32 mmol/L   Glucose, Bld 101 (H) 70 - 99 mg/dL    Comment: Glucose reference range applies only to samples taken after fasting for at least 8 hours.   BUN 12 8 - 23 mg/dL   Creatinine, Ser 6.38 0.44 - 1.00 mg/dL   Calcium 8.9 8.9 - 75.6 mg/dL   Total Protein 7.4 6.5 - 8.1 g/dL   Albumin 3.6 3.5 - 5.0 g/dL   AST 26 15 - 41 U/L   ALT 19 0 - 44 U/L   Alkaline Phosphatase 109 38 - 126 U/L   Total Bilirubin 1.4 (H) 0.3 - 1.2 mg/dL   GFR, Estimated 58 (L) >60 mL/min    Comment: (NOTE) Calculated using the CKD-EPI Creatinine Equation (2021)     Anion gap 8 5 - 15    Comment: Performed at [facility], [address] [place], [place] [address]  CBC with Differential     Status: Abnormal   Collection Time: 11/23/22  4:57 PM  Result Value Ref Range   WBC 9.2 4.0 - 10.5 K/uL   RBC 3.90 3.87 - 5.11 MIL/uL   Hemoglobin 12.5 12.0 - 15.0 g/dL   HCT 51.8 84.1 - 66.0 %   MCV 100.8 (H) 80.0 - 100.0 fL   MCH 32.1 26.0 - 34.0 pg   MCHC 31.8 30.0 -  36.0 g/dL   RDW 74.2 59.5 - 63.8 %   Platelets 222 150 - 400 K/uL   nRBC 0.0 0.0 - 0.2 %   Neutrophils Relative % 74 %   Neutro Abs 6.8 1.7 - 7.7 K/uL   Lymphocytes Relative 11 %   Lymphs Abs 1.0 0.7 - 4.0 K/uL   Monocytes Relative 8 %   Monocytes Absolute 0.7 0.1 - 1.0 K/uL   Eosinophils Relative 6 %   Eosinophils Absolute 0.6 (H) 0.0 - 0.5 K/uL   Basophils Relative 1 %   Basophils Absolute 0.1 0.0 - 0.1 K/uL   Immature Granulocytes 0 %   Abs Immature Granulocytes 0.04 0.00 - 0.07 K/uL    Comment: Performed at [facility], [address], [place], [place] [address]  Brain natriuretic peptide     Status: Abnormal   Collection Time: 11/23/22  4:57 PM  Result Value Ref Range   B Natriuretic Peptide 210.3 (H) 0.0 - 100.0 pg/mL    Comment: Performed at [facility], [address] [place], [place] [address]  Troponin I (High Sensitivity)     Status: Abnormal   Collection Time: 11/23/22  4:57 PM  Result Value Ref Range   Troponin I (High Sensitivity) 22 (H) <18 ng/L    Comment: (NOTE) Elevated high sensitivity troponin I (hsTnI) values and significant  changes across serial measurements may suggest ACS but many other  chronic and acute conditions are known to elevate hsTnI results.  Refer to the "Links" section for chest pain algorithms and additional  guidance. Performed at [facility], [address] [place], [place] [address]   Magnesium     Status: None   Collection Time: 11/23/22  4:57 PM  Result Value Ref Range   Magnesium 1.9 1.7 - 2.4 mg/dL     Comment: Performed at [facility], [address], [place], [place] [address]  I-Stat venous blood gas, ED ([facility])     Status: Abnormal   Collection Time: 11/23/22  5:06 PM  Result Value Ref Range   pH, Ven 7.395 7.25 - 7.43   pCO2, Ven 40.6 (L) 44 - 60 mmHg   pO2, Ven 29 (LL) 32 - 45 mmHg   Bicarbonate 24.8 20.0 - 28.0 mmol/L   TCO2 26 22 - 32 mmol/L   O2 Saturation 55 %   Acid-Base Excess 0.0 0.0 - 2.0 mmol/L   Sodium 143 135 - 145 mmol/L   Potassium 3.7 3.5 - 5.1 mmol/L   Calcium, Ion 1.14 (L) 1.15 - 1.40 mmol/L   HCT 39.0 36.0 - 46.0 %   Hemoglobin 13.3 12.0 - 15.0 g/dL   Sample type VENOUS    Comment NOTIFIED PHYSICIAN   I-Stat Lactic Acid, ED     Status: None   Collection Time: 11/23/22  5:07 PM  Result Value Ref Range   Lactic Acid, Venous 1.1 0.5 - 1.9 mmol/L  I-stat chem 8, ED (not at [facility], [facility] or [facility])     Status: None   Collection Time: 11/23/22  5:07 PM  Result Value Ref Range   Sodium 142 135 - 145 mmol/L   Potassium 3.7 3.5 - 5.1 mmol/L   Chloride 107 98 - 111 mmol/L   BUN 13 8 - 23 mg/dL   Creatinine, Ser 1.60 0.44 - 1.00 mg/dL   Glucose, Bld 94 70 - 99 mg/dL    Comment: Glucose reference range applies only to samples taken after fasting for  at least 8 hours.   Calcium, Ion 1.16 1.15 - 1.40 mmol/L   TCO2 23 22 - 32 mmol/L   Hemoglobin 13.6 12.0 - 15.0 g/dL   HCT 40.1 02.7 - 25.3 %  Urinalysis, w/ Reflex to Culture (Infection Suspected) -Urine, Clean Catch     Status: Abnormal   Collection Time: 11/23/22  7:17 PM  Result Value Ref Range   Specimen Source URINE, CLEAN CATCH    Color, Urine YELLOW YELLOW   APPearance CLEAR CLEAR   Specific Gravity, Urine 1.020 1.005 - 1.030   pH 7.0 5.0 - 8.0   Glucose, UA NEGATIVE NEGATIVE mg/dL   Hgb urine dipstick MODERATE (A) NEGATIVE   Bilirubin Urine NEGATIVE NEGATIVE   Ketones, ur NEGATIVE NEGATIVE mg/dL   Protein, ur NEGATIVE NEGATIVE mg/dL   Nitrite NEGATIVE NEGATIVE   Leukocytes,Ua NEGATIVE NEGATIVE    Squamous Epithelial / HPF 0-5 0 - 5 /HPF   WBC, UA 0-5 0 - 5 WBC/hpf    Comment: Reflex urine culture not performed if WBC <=10, OR if Squamous epithelial cells >5. If Squamous epithelial cells >5, suggest recollection.   RBC / HPF 21-50 0 - 5 RBC/hpf   Bacteria, UA NONE SEEN NONE SEEN    Comment: Performed at [facility], [address], [place], [place] [address]  CBC     Status: Abnormal   Collection Time: 11/23/22  8:46 PM  Result Value Ref Range   WBC 10.9 (H) 4.0 - 10.5 K/uL   RBC 3.69 (L) 3.87 - 5.11 MIL/uL   Hemoglobin 11.6 (L) 12.0 - 15.0 g/dL   HCT 34.7 42.5 - 95.6 %   MCV 99.5 80.0 - 100.0 fL   MCH 31.4 26.0 - 34.0 pg   MCHC 31.6 30.0 - 36.0 g/dL   RDW 38.7 56.4 - 33.2 %   Platelets 206 150 - 400 K/uL   nRBC 0.0 0.0 - 0.2 %    Comment: Performed at [facility], [address], [place], [place] [address]   CT CHEST ABDOMEN PELVIS WO CONTRAST  Result Date: 11/23/2022 CLINICAL DATA:  Fall with chest and abdomen pain. EXAM: CT CHEST, ABDOMEN AND PELVIS WITHOUT CONTRAST TECHNIQUE: Multidetector CT imaging of the chest, abdomen and pelvis was performed following the standard protocol without IV contrast. RADIATION DOSE REDUCTION: This exam was performed according to the departmental dose-optimization program which includes automated exposure control, adjustment of the mA and/or kV according to patient size and/or use of iterative reconstruction technique. COMPARISON:  Chest radiograph dated 07/07/2022, CT abdomen and pelvis dated 02/13/2021. FINDINGS: CT CHEST FINDINGS Cardiovascular: Vascular calcifications are seen in the coronary arteries and aortic arch. The heart is enlarged. No pericardial effusion. Mediastinum/Nodes: No enlarged mediastinal, hilar, or axillary lymph nodes. Thyroid gland, trachea, and esophagus demonstrate no significant findings. Lungs/Pleura: There are moderate to large right and small left pleural effusions with associated atelectasis. There are  moderate patchy bilateral apical ground-glass opacities. No pneumothorax. Musculoskeletal: Degenerative changes are seen in the spine. CT ABDOMEN PELVIS FINDINGS Hepatobiliary: No focal liver abnormality is seen. No gallstones, gallbladder wall thickening, or biliary dilatation. Pancreas: Unremarkable. No pancreatic ductal dilatation or surrounding inflammatory changes. Spleen: Normal in size without focal abnormality. Adrenals/Urinary Tract: The left adrenal gland demonstrates mild thickening, unchanged from prior exam. Three calcifications appear to be located in the upper right ureter, measuring up to 4 mm in size, and appear similar to prior exam (series 8 images 84-85). There is a possible distal  right ureteral calculus/density (series 5, image 111) which measures 3 mm and was not definitely seen on prior exam. Multiple nonobstructive right renal calculus measure up to 11 x 4 mm in size. Bilateral renal cysts appear unchanged. No calculi on the left. No hydronephrosis on either side. The bladder is unremarkable. Stomach/Bowel: Stomach is within normal limits. There is mild distention of the rectum with stool and mild circumferential wall thickening. There is colonic diverticulosis without evidence of diverticulitis. No evidence of bowel wall thickening, distention, or inflammatory changes. Vascular/Lymphatic: Aortic atherosclerosis. No enlarged abdominal or pelvic lymph nodes. Reproductive: Status post hysterectomy. No adnexal masses. Other: No abdominal wall hernia or abnormality. No abdominopelvic ascites. Musculoskeletal: Degenerative changes are seen in the spine. IMPRESSION: 1. No acute traumatic injury in the chest, abdomen, or pelvis. 2. Moderate to large right and small left pleural effusions with associated atelectasis. Moderate patchy bilateral apical ground-glass opacities may reflect pneumonia. 3. Three calcifications in the right ureter appear similar to prior exam. One possible distal right  ureteral calculus/density was not definitely seen on prior exam. No hydronephrosis on either side. 4. Mild distention of the rectum with stool and mild circumferential wall thickening may reflect stercoral colitis. Aortic Atherosclerosis (ICD10-I70.0). Electronically Signed   By: Romona Curls M.D.   On: 11/23/2022 18:01   CT Head Wo Contrast  Result Date: 11/23/2022 CLINICAL DATA:  Fall, head trauma EXAM: CT HEAD WITHOUT CONTRAST CT CERVICAL SPINE WITHOUT CONTRAST TECHNIQUE: Multidetector CT imaging of the head and cervical spine was performed following the standard protocol without intravenous contrast. Multiplanar CT image reconstructions of the cervical spine were also generated. RADIATION DOSE REDUCTION: This exam was performed according to the departmental dose-optimization program which includes automated exposure control, adjustment of the mA and/or kV according to patient size and/or use of iterative reconstruction technique. COMPARISON:  CT head dated 07/26/2022 FINDINGS: CT HEAD FINDINGS Brain: No evidence of acute infarction, hemorrhage, hydrocephalus, extra-axial collection or mass lesion/mass effect. 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 Subcortical white matter and periventricular small vessel ischemic changes. Vascular: Intracranial atherosclerosis. Skull: Normal. Negative for fracture or focal lesion. Sinuses/Orbits: The visualized paranasal sinuses are essentially clear. The mastoid air cells are unopacified. Other: None. CT CERVICAL SPINE FINDINGS Alignment: Normal cervical lordosis. Skull base and vertebrae: No acute fracture. No primary bone lesion or focal pathologic process. Soft tissues and spinal canal: No prevertebral fluid or swelling. No visible canal hematoma. Disc levels: Mild degenerative changes of the mid cervical spine. Spinal canal is patent. Upper chest: Evaluated on dedicated CT chest. Other: None. IMPRESSION: No acute intracranial abnormality.  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 Small vessel ischemic changes. No traumatic injury to the cervical spine. Mild degenerative changes. Electronically Signed   By: Charline Bills M.D.   On: 11/23/2022 17:44   CT Cervical Spine Wo Contrast  Result Date: 11/23/2022 CLINICAL DATA:  Fall, head trauma EXAM: CT HEAD WITHOUT CONTRAST CT CERVICAL SPINE WITHOUT CONTRAST TECHNIQUE: Multidetector CT imaging of the head and cervical spine was performed following the standard protocol without intravenous contrast. Multiplanar CT image reconstructions of the cervical spine were also generated. RADIATION DOSE REDUCTION: This exam was performed according to the departmental dose-optimization program which includes automated exposure control, adjustment of the mA and/or kV according to patient size and/or use of iterative reconstruction technique. COMPARISON:  CT head dated 07/26/2022 FINDINGS: CT HEAD FINDINGS Brain: No evidence of acute infarction, hemorrhage, hydrocephalus, extra-axial collection or  mass lesion/mass effect. 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 Subcortical white matter and periventricular small vessel ischemic changes. Vascular: Intracranial atherosclerosis. Skull: Normal. Negative for fracture or focal lesion. Sinuses/Orbits: The visualized paranasal sinuses are essentially clear. The mastoid air cells are unopacified. Other: None. CT CERVICAL SPINE FINDINGS Alignment: Normal cervical lordosis. Skull base and vertebrae: No acute fracture. No primary bone lesion or focal pathologic process. Soft tissues and spinal canal: No prevertebral fluid or swelling. No visible canal hematoma. Disc levels: Mild degenerative changes of the mid cervical spine. Spinal canal is patent. Upper chest: Evaluated on dedicated CT chest. Other: None. IMPRESSION: No acute intracranial abnormality. 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  Small vessel ischemic changes. No traumatic injury to the cervical spine. Mild degenerative changes. Electronically Signed   By: Charline Bills M.D.   On: 11/23/2022 17:44    Pending Labs Unresulted Labs (From admission, onward)     Start     Ordered   11/24/22 0500  Basic metabolic panel  Tomorrow morning,   R        11/23/22 2042   11/24/22 0500  Magnesium  Tomorrow morning,   R        11/23/22 2042   11/24/22 0500  Phosphorus  Tomorrow morning,   R        11/23/22 2042   11/24/22 0500  Hepatic function panel  Tomorrow morning,   R        11/23/22 2042   11/24/22 0500  Lipid panel  Tomorrow morning,   R        11/23/22 2042   11/24/22 0500  Hemoglobin A1c  Tomorrow morning,   R        11/23/22 2042   11/23/22 2042  Resp panel by RT-PCR (RSV, Flu A&B, Covid) Anterior Nasal Swab  (Tier 2 - SARS Coronavirus 2 by RT PCR (hospital order, performed in [facility] hospital lab) *cepheid single result test*)  Once,   R        11/23/22 2042   11/23/22 2042  Urine Culture (for pregnant, neutropenic or urologic patients or patients with an indwelling urinary catheter)  (Urine Labs)  Once,   R       Comments: Add on to urinalysis   Question:  Indication  Answer:  Dysuria   11/23/22 2042   11/23/22 2041  Culture, Respiratory w Gram Stain  Once,   R        11/23/22 2042   11/23/22 2036  TSH  Add-on,   AD        11/23/22 2042   11/23/22 1952  CK  Once,   URGENT        11/23/22 1951   11/23/22 1637  Blood Culture (routine x 2)  (Undifferentiated presentation (screening labs and basic nursing orders))  BLOOD CULTURE X 2,   STAT      11/23/22 1637            Vitals/Pain Today's Vitals   11/23/22 1900 11/23/22 1945 11/23/22 2030 11/23/22 2115  BP: (!) 173/107 (!) 142/75 122/70 (!) 120/106  Pulse: 100 83 100 (!) 105  Resp: (!) 27 (!) 33 (!) 21 (!) 24  Temp:      TempSrc:      SpO2: 98% 96% 98% 96%  Weight:      Height:      PainSc:        Isolation Precautions No active  isolations  Medications Medications  fentaNYL (SUBLIMAZE) injection 25 mcg (25 mcg Intravenous Not Given 11/23/22 1812)  metroNIDAZOLE (FLAGYL) IVPB 500 mg (500 mg Intravenous New Bag/Given 11/23/22 2032)  furosemide (LASIX) injection 20 mg (has no administration in time range)  acetaminophen (TYLENOL) tablet 1,000 mg (has no administration in time range)  ceFEPIme (MAXIPIME) 2 g in sodium chloride 0.9 % 100 mL IVPB (has no administration in time range)  aspirin EC tablet 81 mg (has no administration in time range)  atorvastatin (LIPITOR) tablet 80 mg (has no administration in time range)  sertraline (ZOLOFT) tablet 50 mg (has no administration in time range)  levothyroxine (SYNTHROID) tablet 100 mcg (has no administration in time range)  senna (SENOKOT) tablet 17.2 mg (has no administration in time range)  mirabegron ER (MYRBETRIQ) tablet 25 mg (has no administration in time range)  apixaban (ELIQUIS) tablet 5 mg (has no administration in time range)  dorzolamide-timolol (COSOPT) 2-0.5 % ophthalmic solution 1 drop (has no administration in time range)  Netarsudil Dimesylate 0.02 % SOLN 1 drop (has no administration in time range)  cefTRIAXone (ROCEPHIN) 1 g in sodium chloride 0.9 % 100 mL IVPB (0 g Intravenous Stopped 11/23/22 1817)  lactated ringers bolus 1,000 mL (0 mLs Intravenous Stopped 11/23/22 1922)  azithromycin (ZITHROMAX) 500 mg in sodium chloride 0.9 % 250 mL IVPB (0 mg Intravenous Stopped 11/23/22 2030)  sodium phosphate (FLEET) enema 1 enema (1 enema Rectal Given 11/23/22 2000)    Mobility walks with device     Focused Assessments Cardiac Assessment Handoff:    No results found for: "CKTOTAL", "CKMB", "CKMBINDEX", "TROPONINI" No results found for: "DDIMER" Does the Patient currently have chest pain? No    R Recommendations: See Admitting Provider Note  Report given to:   Additional Notes:

## 2022-11-23 NOTE — ED Notes (Signed)
The pt refused to be cathed  dr Durwin Nora made aware

## 2022-11-23 NOTE — ED Notes (Signed)
Pt came back from c-t talking and asking for food and water

## 2022-11-23 NOTE — ED Triage Notes (Signed)
The pt was checked in by another rn  the pt came by gems from home  fell today baseline a and o  responds to pain only  gcs 9   on arrival  poss kidney stone  in the pasrt few days

## 2022-11-23 NOTE — ED Notes (Signed)
Xray brought  portal machine to do her xrays  the pt is very upset about all the movement  we had just cleaned up her stool light brown liquid from the enema she was given per the edps order  she has  to have a swallow screen also before she cane have food  and she needs a covid test

## 2022-11-23 NOTE — ED Notes (Signed)
Son [name] [phone] would like an update asap

## 2022-11-24 ENCOUNTER — Inpatient Hospital Stay (HOSPITAL_COMMUNITY): Payer: Medicare HMO

## 2022-11-24 ENCOUNTER — Other Ambulatory Visit (HOSPITAL_COMMUNITY): Payer: Medicare HMO

## 2022-11-24 ENCOUNTER — Observation Stay (HOSPITAL_COMMUNITY): Payer: Medicare HMO

## 2022-11-24 DIAGNOSIS — E785 Hyperlipidemia, unspecified: Secondary | ICD-10-CM | POA: Diagnosis present

## 2022-11-24 DIAGNOSIS — W1811XA Fall from or off toilet without subsequent striking against object, initial encounter: Secondary | ICD-10-CM | POA: Diagnosis present

## 2022-11-24 DIAGNOSIS — I255 Ischemic cardiomyopathy: Secondary | ICD-10-CM | POA: Diagnosis present

## 2022-11-24 DIAGNOSIS — N1831 Chronic kidney disease, stage 3a: Secondary | ICD-10-CM | POA: Diagnosis present

## 2022-11-24 DIAGNOSIS — R0602 Shortness of breath: Secondary | ICD-10-CM | POA: Diagnosis not present

## 2022-11-24 DIAGNOSIS — E1122 Type 2 diabetes mellitus with diabetic chronic kidney disease: Secondary | ICD-10-CM | POA: Diagnosis present

## 2022-11-24 DIAGNOSIS — J189 Pneumonia, unspecified organism: Secondary | ICD-10-CM | POA: Diagnosis present

## 2022-11-24 DIAGNOSIS — Z1152 Encounter for screening for COVID-19: Secondary | ICD-10-CM | POA: Diagnosis not present

## 2022-11-24 DIAGNOSIS — F0393 Unspecified dementia, unspecified severity, with mood disturbance: Secondary | ICD-10-CM | POA: Diagnosis present

## 2022-11-24 DIAGNOSIS — I130 Hypertensive heart and chronic kidney disease with heart failure and stage 1 through stage 4 chronic kidney disease, or unspecified chronic kidney disease: Secondary | ICD-10-CM | POA: Diagnosis present

## 2022-11-24 DIAGNOSIS — I252 Old myocardial infarction: Secondary | ICD-10-CM | POA: Diagnosis not present

## 2022-11-24 DIAGNOSIS — G9341 Metabolic encephalopathy: Secondary | ICD-10-CM | POA: Diagnosis present

## 2022-11-24 DIAGNOSIS — N390 Urinary tract infection, site not specified: Secondary | ICD-10-CM

## 2022-11-24 DIAGNOSIS — R410 Disorientation, unspecified: Secondary | ICD-10-CM | POA: Diagnosis present

## 2022-11-24 DIAGNOSIS — I4811 Longstanding persistent atrial fibrillation: Secondary | ICD-10-CM | POA: Diagnosis present

## 2022-11-24 DIAGNOSIS — R1310 Dysphagia, unspecified: Secondary | ICD-10-CM | POA: Diagnosis present

## 2022-11-24 DIAGNOSIS — F05 Delirium due to known physiological condition: Secondary | ICD-10-CM | POA: Diagnosis present

## 2022-11-24 DIAGNOSIS — R0989 Other specified symptoms and signs involving the circulatory and respiratory systems: Secondary | ICD-10-CM | POA: Diagnosis not present

## 2022-11-24 DIAGNOSIS — R627 Adult failure to thrive: Secondary | ICD-10-CM | POA: Diagnosis present

## 2022-11-24 DIAGNOSIS — K5641 Fecal impaction: Secondary | ICD-10-CM | POA: Diagnosis present

## 2022-11-24 DIAGNOSIS — E876 Hypokalemia: Secondary | ICD-10-CM | POA: Diagnosis present

## 2022-11-24 DIAGNOSIS — I2510 Atherosclerotic heart disease of native coronary artery without angina pectoris: Secondary | ICD-10-CM | POA: Diagnosis present

## 2022-11-24 DIAGNOSIS — E039 Hypothyroidism, unspecified: Secondary | ICD-10-CM | POA: Diagnosis present

## 2022-11-24 DIAGNOSIS — I5022 Chronic systolic (congestive) heart failure: Secondary | ICD-10-CM | POA: Diagnosis present

## 2022-11-24 DIAGNOSIS — I071 Rheumatic tricuspid insufficiency: Secondary | ICD-10-CM | POA: Diagnosis present

## 2022-11-24 DIAGNOSIS — K5289 Other specified noninfective gastroenteritis and colitis: Secondary | ICD-10-CM | POA: Diagnosis present

## 2022-11-24 DIAGNOSIS — Z79890 Hormone replacement therapy (postmenopausal): Secondary | ICD-10-CM | POA: Diagnosis not present

## 2022-11-24 DIAGNOSIS — Z7189 Other specified counseling: Secondary | ICD-10-CM | POA: Diagnosis not present

## 2022-11-24 DIAGNOSIS — Z515 Encounter for palliative care: Secondary | ICD-10-CM | POA: Diagnosis not present

## 2022-11-24 DIAGNOSIS — B9620 Unspecified Escherichia coli [E. coli] as the cause of diseases classified elsewhere: Secondary | ICD-10-CM | POA: Diagnosis present

## 2022-11-24 LAB — BASIC METABOLIC PANEL
Anion gap: 10 (ref 5–15)
BUN: 13 mg/dL (ref 8–23)
CO2: 21 mmol/L — ABNORMAL LOW (ref 22–32)
Calcium: 8.1 mg/dL — ABNORMAL LOW (ref 8.9–10.3)
Chloride: 106 mmol/L (ref 98–111)
Creatinine, Ser: 1 mg/dL (ref 0.44–1.00)
GFR, Estimated: 56 mL/min — ABNORMAL LOW (ref >60)
Glucose, Bld: 175 mg/dL — ABNORMAL HIGH (ref 70–99)
Potassium: 3.2 mmol/L — ABNORMAL LOW (ref 3.5–5.1)
Sodium: 137 mmol/L (ref 135–145)

## 2022-11-24 LAB — BRAIN NATRIURETIC PEPTIDE: B Natriuretic Peptide: 239.7 pg/mL — ABNORMAL HIGH (ref 0.0–100.0)

## 2022-11-24 LAB — T4, FREE: Free T4: 1.12 ng/dL (ref 0.61–1.12)

## 2022-11-24 LAB — TROPONIN I (HIGH SENSITIVITY): Troponin I (High Sensitivity): 22 ng/L — ABNORMAL HIGH (ref <18)

## 2022-11-24 LAB — LIPID PANEL
Cholesterol: 104 mg/dL (ref 0–200)
HDL: 41 mg/dL (ref >40)
LDL Cholesterol: 55 mg/dL (ref 0–99)
Total CHOL/HDL Ratio: 2.5 ratio
Triglycerides: 39 mg/dL (ref <150)
VLDL: 8 mg/dL (ref 0–40)

## 2022-11-24 LAB — HEPATIC FUNCTION PANEL
ALT: 16 U/L (ref 0–44)
AST: 26 U/L (ref 15–41)
Albumin: 3.2 g/dL — ABNORMAL LOW (ref 3.5–5.0)
Alkaline Phosphatase: 105 U/L (ref 38–126)
Bilirubin, Direct: 0.2 mg/dL (ref 0.0–0.2)
Indirect Bilirubin: 0.7 mg/dL (ref 0.3–0.9)
Total Bilirubin: 0.9 mg/dL (ref 0.3–1.2)
Total Protein: 6.2 g/dL — ABNORMAL LOW (ref 6.5–8.1)

## 2022-11-24 LAB — MAGNESIUM: Magnesium: 1.8 mg/dL (ref 1.7–2.4)

## 2022-11-24 LAB — CK: Total CK: 83 U/L (ref 38–234)

## 2022-11-24 LAB — PHOSPHORUS: Phosphorus: 3.3 mg/dL (ref 2.5–4.6)

## 2022-11-24 MED ORDER — SENNOSIDES-DOCUSATE SODIUM 8.6-50 MG PO TABS
2.0000 | ORAL_TABLET | Freq: Two times a day (BID) | ORAL | Status: DC
  Administered 2022-11-24 – 2022-11-26 (×6): 2 via ORAL
  Filled 2022-11-24 (×7): qty 2

## 2022-11-24 MED ORDER — BISACODYL 10 MG RE SUPP: 10.0000 mg | Freq: Every day | RECTAL | Status: DC | PRN

## 2022-11-24 MED ORDER — POTASSIUM CHLORIDE CRYS ER 20 MEQ PO TBCR
30.0000 meq | EXTENDED_RELEASE_TABLET | ORAL | Status: AC
  Administered 2022-11-24 (×2): 30 meq via ORAL
  Filled 2022-11-24 (×2): qty 1

## 2022-11-24 MED ORDER — HYDRALAZINE HCL 25 MG PO TABS
25.0000 mg | ORAL_TABLET | Freq: Three times a day (TID) | ORAL | Status: DC | PRN
  Administered 2022-11-24: 25 mg via ORAL
  Filled 2022-11-24 (×2): qty 1

## 2022-11-24 MED ORDER — MAGNESIUM SULFATE 2 GM/50ML IV SOLN
2.0000 g | Freq: Once | INTRAVENOUS | Status: AC
  Administered 2022-11-24: 2 g via INTRAVENOUS
  Filled 2022-11-24: qty 50

## 2022-11-24 MED ORDER — FUROSEMIDE 20 MG PO TABS
20.0000 mg | ORAL_TABLET | Freq: Every day | ORAL | Status: DC
  Administered 2022-11-24 – 2022-11-26 (×3): 20 mg via ORAL
  Filled 2022-11-24 (×4): qty 1

## 2022-11-24 MED ORDER — LOSARTAN POTASSIUM 25 MG PO TABS
25.0000 mg | ORAL_TABLET | Freq: Every day | ORAL | Status: DC
  Administered 2022-11-24 – 2022-11-26 (×3): 25 mg via ORAL
  Filled 2022-11-24 (×4): qty 1

## 2022-11-24 NOTE — Progress Notes (Signed)
Pt's bed exit alarming and upon examination pt had vomited and was found coughing. Provider made aware and orders received. Son arrived at bedside shortly after this occurrence was told pt would be NPO until speech therapy sees pt. Aspiration explained to son and he remarks that he does remember his mother coughing after eating sometimes.

## 2022-11-24 NOTE — Progress Notes (Addendum)
PROGRESS NOTE    AVALEE KAPPEN  UKG:[id number] DOB: May 15, 1940 DOA: 11/23/2022 PCP: Pincus Sanes, MD    Brief Narrative:   Yvonne Bailey is a 82 y.o. female with past medical history significant for dementia, CVA, CAD, chronic systolic congestive heart failure, atrial fibrillation on Eliquis, CKD stage IIIa, hypothyroidism, HTN, HLD, prediabetes who presented to [facility] ED on 9/14 with confusion and ground-level fall.  History limited due to underlying dementia.  Son reports patient was in the bathroom and trying to help her off the toilet in which she fell onto the ground.  Denies injuries or loss of consciousness but with global weakness.  Patient reported recent dysuria and seen by PCP and prescribed course of Macrobid in which she has completed 3 days.  Denied fever, no chills, no cough, no abdominal pain, no nausea/vomiting.   In the ED, temperature 98.1 F, HR 95, RR 17, BP 173/102, SpO2 96% on room air.  WBC 9.2, hemoglobin 12.5, platelet count 222.  Sodium 142, potassium 3.7, chloride 107, CO2 27, glucose 101, BUN 12, creatinine 0.97.  AST 26, ALT 19, total bilirubin 1.4.  BNP 210.3.  High sensitive troponin 22>22>22.  Lactic acid 1.1.  COVID/influenza/RSV PCR negative.  Urinalysis with negative leukocytes, negative nitrite, no bacteria, 0-5 WBCs.  CT head/C-spine with no acute intracranial Gershon Mussel, old left parietal infarct, old right basal ganglia lacunar infarct, small vessel ischemic changes, no traumatic injury to the cervical spine with mild noted degenerative changes.  Right femur x-ray with no acute abnormality.  CT chest/abdomen/pelvis with no acute traumatic injury in the chest/abdomen/pelvis, moderate to large right pleural effusion associated with atelectasis, moderate patchy bilateral apical groundglass opacities possibly reflective of pneumonia, 3 calcifications right ureter appears similar to prior, 1 possible distal right ureteral calculus, no hydronephrosis, mild  distention of the rectum with stool and mild circumferential wall thickening likely reflective of stercoral colitis.  Blood cultures x 2 obtained.  EDP ordered azithromycin, ceftriaxone, metronidazole, Fleet enema, 1 L LR bolus.  TRH consulted for admission for further evaluation and management of acute metabolic encephalopathy, concern for community-acquired pneumonia and possible UTI, weakness and fall.  Assessment & Plan:   Acute metabolic encephalopathy: Resolved Patient presenting with confusion, was afebrile without leukocytosis.  Recent urinary tract infection treated with outpatient antibiotics as well as possible underlying pneumonia noted on CT chest.  Patient received IV antibiotics and IV fluid hydration with improvement in mental status now appears to be at her typical baseline.  Complicated by her underlying dementia. -- Continue treatment as below  Community-acquired pneumonia CT chest with moderate patchy bilateral apical groundglass opacities possibly reflective of pneumonia.  Patient is afebrile without leukocytosis. -- Blood cultures x 2: No growth less than 24 hours -- Cefepime 2 g IV every 12 hours  History of recent UTI Recently treated with 3-day course of Macrobid.  Had been complaining of dysuria.  Previous history of Klebsiella, E. coli, Proteus UTI.  Urinalysis relatively unrevealing. -- Urine culture: Pending -- Continue cefepime as above  Constipation/stercoral colitis CT abdomen/pelvis with distention of rectum with stool and wall thickening concerning for sterile coral colitis.  Underwent fecal disimpaction in the ED. -- Senokot-S 2 tablets p.o. twice daily -- Bisacodyl suppository 10 mg PR as needed moderate constipation -- Continue to monitor bowel movements  Hypokalemia Potassium 3.2, magnesium 1.8, will replete. -- Repeat electrolytes in the a.m.  Nausea/vomiting: With apparent unwitnessed aspiration event on 9/15. -- Chest x-ray: Pending -- SLP  evaluation: Pending -- Aspiration precautions  Chronic systolic congestive heart failure, compensated Moderate right pleural effusion TEE 11/02/2020 with LVEF 35-40%, LV moderately decreased function with regional wall motion abnormalities, diastolic function not evaluated, biatrial enlargement, no aortic stenosis.  Moderate right pleural effusion noted on CT chest.  Oxygenating well on room air. -- TTE: Pending -- Not on ARB, ACE inhibitor, beta-blocker or diuretic at baseline -- start furosemide 20 mg p.o. daily -- Strict I's and O's Daily weights  Elevated troponin Elevated troponin of 22 which remained flat x 3.  Etiology likely secondary to type II demand ischemia versus decreased renal clearance with her underlying CKD.  Denies chest pain.  No concerning dynamic changes noted on EKG. -- Monitor on telemetry  Paroxysmal atrial fibrillation Essential hypertension Currently not on antihypertensives or rate controlling medications at baseline.  EKG with atrial fibrillation, rate 72. -- Continue Eliquis -- Monitor on telemetry  Hyperlipidemia -- Atorvastatin 80 mg p.o. daily  Depression -- Sertraline 50 mg p.o. daily  CKD stage IIIa -- Cr 0.97>0.90>1.00, at baseline  Hypothyroidism TSH low at 0.341, free T4 1.12, within normal limits. -- Levothyroxine micrograms p.o. daily  History of prediabetes Hemoglobin A1c 5.2 on 03/18/2022.  Dementia --Delirium precautions --Get up during the day --Encourage a familiar face to remain present throughout the day --Keep blinds open and lights on during daylight hours --Minimize the use of opioids/benzodiazepines  Weakness/debility/deconditioning/gait disturbance: Ground-level fall Patient presenting with fall from the toilet to the floor, no loss of consciousness.  CT head/C-spine unrevealing, x-ray right hip with no acute fracture/dislocation. -- PT/OT evaluation  DVT prophylaxis:  apixaban (ELIQUIS) tablet 5 mg    Code Status:  Full Code Family Communication: No family present at bedside this morning  Disposition Plan:  Level of care: Telemetry Medical Status is: Observation The patient remains OBS appropriate and will d/c before 2 midnights.    Consultants:  None  Procedures:  TTE: Pending  Antimicrobials:  Cefepime   Subjective: Patient seen examined bedside, resting company.  Pleasantly confused.  Drinking coffee.  Very conversant, no specific complaints or concerns at this time.  Denies headache, no chest pain, no shortness of breath, no abdominal pain.  No acute events overnight per nurse staff.  Objective: Vitals:   11/23/22 2249 11/24/22 0440 11/24/22 0543 11/24/22 0842  BP: (!) 184/84 (!) 145/86 (!) 151/72 134/81  Pulse: 97 65 72 76  Resp: 20 18    Temp: 98.9 F (37.2 C) 97.6 F (36.4 C) 97.6 F (36.4 C)   TempSrc: Oral  Oral   SpO2: 99% 100% 97% 99%  Weight: 79.9 kg     Height:        Intake/Output Summary (Last 24 hours) at 11/24/2022 1113 Last data filed at 11/24/2022 0906 Gross per 24 hour  Intake 480 ml  Output 500 ml  Net -20 ml   Filed Weights   11/23/22 1650 11/23/22 2249  Weight: 74.6 kg 79.9 kg    Examination:  Physical Exam: GEN: NAD, alert and oriented to place/person but not time.  Pleasantly confused, elderly in appearance HEENT: NCAT, PERRL, EOMI, sclera clear, MMM PULM: Diminished breath sounds right base, no wheezing/crackles, normal respiratory effort without accessory muscle use, on room air with SpO2 97% at rest CV: IIR, nomal rate w/o M/G/R GI: abd soft, NTND, NABS, no R/G/M MSK: no peripheral edema, moves all extremities independently with preserved muscle strength that is symmetric NEURO: No focal neurological deficits PSYCH: normal mood/affect Integumentary: No concerning  rashes/lesions/wounds noted on exposed skin surfaces.    Data Reviewed: I have personally reviewed following labs and imaging studies  CBC: Recent Labs  Lab 11/23/22 1657  11/23/22 1706 11/23/22 1707 11/23/22 2046  WBC 9.2  --   --  10.9*  NEUTROABS 6.8  --   --   --   HGB 12.5 13.3 13.6 11.6*  HCT 39.3 39.0 40.0 36.7  MCV 100.8*  --   --  99.5  PLT 222  --   --  206   Basic Metabolic Panel: Recent Labs  Lab 11/23/22 1657 11/23/22 1706 11/23/22 1707 11/24/22 0845  NA 142 143 142 137  K 3.7 3.7 3.7 3.2*  CL 107  --  107 106  CO2 27  --   --  21*  GLUCOSE 101*  --  94 175*  BUN 12  --  13 13  CREATININE 0.97  --  0.90 1.00  CALCIUM 8.9  --   --  8.1*  MG 1.9  --   --  1.8  PHOS  --   --   --  3.3   GFR: Estimated Creatinine Clearance: 47.2 mL/min (by C-G formula based on SCr of 1 mg/dL). Liver Function Tests: Recent Labs  Lab 11/23/22 1657 11/24/22 0845  AST 26 26  ALT 19 16  ALKPHOS 109 105  BILITOT 1.4* 0.9  PROT 7.4 6.2*  ALBUMIN 3.6 3.2*   No results for input(s): "LIPASE", "AMYLASE" in the last 168 hours. No results for input(s): "AMMONIA" in the last 168 hours. Coagulation Profile: No results for input(s): "INR", "PROTIME" in the last 168 hours. Cardiac Enzymes: Recent Labs  Lab 11/23/22 2322  CKTOTAL 83   BNP (last 3 results) No results for input(s): "PROBNP" in the last 8760 hours. HbA1C: No results for input(s): "HGBA1C" in the last 72 hours. CBG: No results for input(s): "GLUCAP" in the last 168 hours. Lipid Profile: Recent Labs    11/24/22 0846  CHOL 104  HDL 41  LDLCALC 55  TRIG 39  CHOLHDL 2.5   Thyroid Function Tests: Recent Labs    11/23/22 1657 11/24/22 0846  TSH 0.341*  --   FREET4  --  1.12   Anemia Panel: No results for input(s): "VITAMINB12", "FOLATE", "FERRITIN", "TIBC", "IRON", "RETICCTPCT" in the last 72 hours. Sepsis Labs: Recent Labs  Lab 11/23/22 1707  LATICACIDVEN 1.1    Recent Results (from the past 240 hour(s))  Urine Culture     Status: Abnormal   Collection Time: 11/20/22  2:23 PM   Specimen: Urine  Result Value Ref Range Status   Source: URINE  Final   Status:  FINAL  Final   Isolate 1: Escherichia coli (A)  Final    Comment: Greater than 100,000 CFU/mL of Escherichia coli      Susceptibility   Escherichia coli - URINE CULTURE, REFLEX    AMOX/CLAVULANIC 4 Sensitive     AMPICILLIN 4 Sensitive     AMPICILLIN/SULBACTAM <=2 Sensitive     CEFAZOLIN* <=4 Not Reportable      * For infections other than uncomplicated UTI caused by E. coli, K. pneumoniae or P. mirabilis: Cefazolin is resistant if MIC > or = 8 mcg/mL. (Distinguishing susceptible versus intermediate for isolates with MIC < or = 4 mcg/mL requires additional testing.) For uncomplicated UTI caused by E. coli, K. pneumoniae or P. mirabilis: Cefazolin is susceptible if MIC <32 mcg/mL and predicts susceptible to the oral agents cefaclor, cefdinir, cefpodoxime, cefprozil, cefuroxime, cephalexin  and loracarbef.     CEFTAZIDIME <=1 Sensitive     CEFEPIME <=1 Sensitive     CEFTRIAXONE <=1 Sensitive     CIPROFLOXACIN <=0.25 Sensitive     LEVOFLOXACIN <=0.12 Sensitive     GENTAMICIN <=1 Sensitive     IMIPENEM <=0.25 Sensitive     NITROFURANTOIN <=16 Sensitive     PIP/TAZO <=4 Sensitive     TOBRAMYCIN <=1 Sensitive     TRIMETH/SULFA* <=20 Sensitive      * For infections other than uncomplicated UTI caused by E. coli, K. pneumoniae or P. mirabilis: Cefazolin is resistant if MIC > or = 8 mcg/mL. (Distinguishing susceptible versus intermediate for isolates with MIC < or = 4 mcg/mL requires additional testing.) For uncomplicated UTI caused by E. coli, K. pneumoniae or P. mirabilis: Cefazolin is susceptible if MIC <32 mcg/mL and predicts susceptible to the oral agents cefaclor, cefdinir, cefpodoxime, cefprozil, cefuroxime, cephalexin and loracarbef. Legend: S = Susceptible  I = Intermediate R = Resistant  NS = Not susceptible SDD = Susceptible Dose Dependent * = Not Tested  NR = Not Reported **NN = See Therapy Comments   Blood Culture (routine x 2)     Status: None (Preliminary  result)   Collection Time: 11/23/22  5:00 PM   Specimen: BLOOD  Result Value Ref Range Status   Specimen Description BLOOD BLOOD LEFT ARM  Final   Special Requests   Final    BOTTLES DRAWN AEROBIC AND ANAEROBIC Blood Culture adequate volume   Culture   Final    NO GROWTH < 24 HOURS Performed at [facility], [address], [place], [place] [address]    Report Status PENDING  Incomplete  Blood Culture (routine x 2)     Status: None (Preliminary result)   Collection Time: 11/23/22  5:00 PM   Specimen: BLOOD  Result Value Ref Range Status   Specimen Description BLOOD BLOOD RIGHT ARM  Final   Special Requests   Final    BOTTLES DRAWN AEROBIC AND ANAEROBIC Blood Culture adequate volume   Culture   Final    NO GROWTH < 24 HOURS Performed at [facility], [address] [place], [place] [address]    Report Status PENDING  Incomplete  Resp panel by RT-PCR (RSV, Flu A&B, Covid) Anterior Nasal Swab     Status: None   Collection Time: 11/23/22 10:10 PM   Specimen: Anterior Nasal Swab  Result Value Ref Range Status   SARS Coronavirus 2 by RT PCR NEGATIVE NEGATIVE Final   Influenza A by PCR NEGATIVE NEGATIVE Final   Influenza B by PCR NEGATIVE NEGATIVE Final    Comment: (NOTE) The Xpert Xpress SARS-CoV-2/FLU/RSV plus assay is intended as an aid in the diagnosis of influenza from Nasopharyngeal swab specimens and should not be used as a sole basis for treatment. Nasal washings and aspirates are unacceptable for Xpert Xpress SARS-CoV-2/FLU/RSV testing.  Fact Sheet for Patients: BloggerCourse.com  Fact Sheet for Healthcare Providers: SeriousBroker.it  This test is not yet approved or cleared by the [place] FDA and has been authorized for detection and/or diagnosis of SARS-CoV-2 by FDA under an Emergency Use Authorization (EUA). This EUA will remain in effect (meaning this test can be used) for the duration of  the COVID-19 declaration under Section 564(b)(1) of the Act, 21 U.S.C. section 360bbb-3(b)(1), unless the authorization is terminated or revoked.     Resp Syncytial Virus by PCR NEGATIVE NEGATIVE Final    Comment: (  NOTE) Fact Sheet for Patients: BloggerCourse.com  Fact Sheet for Healthcare Providers: SeriousBroker.it  This test is not yet approved or cleared by the [place] FDA and has been authorized for detection and/or diagnosis of SARS-CoV-2 by FDA under an Emergency Use Authorization (EUA). This EUA will remain in effect (meaning this test can be used) for the duration of the COVID-19 declaration under Section 564(b)(1) of the Act, 21 U.S.C. section 360bbb-3(b)(1), unless the authorization is terminated or revoked.  Performed at [facility], [address] [place], [place] [address]          Radiology Studies: DG FEMUR PORT, MIN 2 VIEWS RIGHT  Result Date: 11/23/2022 CLINICAL DATA:  Recent fall with right thigh pain, initial encounter EXAM: RIGHT FEMUR PORTABLE 2 VIEW COMPARISON:  None Available. FINDINGS: Degenerative changes of the right hip joint are noted. No acute fracture or dislocation is seen. No soft tissue changes are noted. IMPRESSION: No acute abnormality noted. Electronically Signed   By: Alcide Clever M.D.   On: 11/23/2022 21:50   DG FEMUR MIN 2 VIEWS LEFT  Result Date: 11/23/2022 CLINICAL DATA:  Recent fall with left thigh pain, initial encounter EXAM: LEFT FEMUR 2 VIEWS COMPARISON:  None Available. FINDINGS: Degenerative changes in the left hip joint are noted. No acute fracture or dislocation is seen. No soft tissue abnormality is noted. IMPRESSION: No acute abnormality seen. Electronically Signed   By: Alcide Clever M.D.   On: 11/23/2022 21:49   CT CHEST ABDOMEN PELVIS WO CONTRAST  Result Date: 11/23/2022 CLINICAL DATA:  Fall with chest and abdomen pain. EXAM: CT CHEST, ABDOMEN AND PELVIS  WITHOUT CONTRAST TECHNIQUE: Multidetector CT imaging of the chest, abdomen and pelvis was performed following the standard protocol without IV contrast. RADIATION DOSE REDUCTION: This exam was performed according to the departmental dose-optimization program which includes automated exposure control, adjustment of the mA and/or kV according to patient size and/or use of iterative reconstruction technique. COMPARISON:  Chest radiograph dated 07/07/2022, CT abdomen and pelvis dated 02/13/2021. FINDINGS: CT CHEST FINDINGS Cardiovascular: Vascular calcifications are seen in the coronary arteries and aortic arch. The heart is enlarged. No pericardial effusion. Mediastinum/Nodes: No enlarged mediastinal, hilar, or axillary lymph nodes. Thyroid gland, trachea, and esophagus demonstrate no significant findings. Lungs/Pleura: There are moderate to large right and small left pleural effusions with associated atelectasis. There are moderate patchy bilateral apical ground-glass opacities. No pneumothorax. Musculoskeletal: Degenerative changes are seen in the spine. CT ABDOMEN PELVIS FINDINGS Hepatobiliary: No focal liver abnormality is seen. No gallstones, gallbladder wall thickening, or biliary dilatation. Pancreas: Unremarkable. No pancreatic ductal dilatation or surrounding inflammatory changes. Spleen: Normal in size without focal abnormality. Adrenals/Urinary Tract: The left adrenal gland demonstrates mild thickening, unchanged from prior exam. Three calcifications appear to be located in the upper right ureter, measuring up to 4 mm in size, and appear similar to prior exam (series 8 images 84-85). There is a possible distal right ureteral calculus/density (series 5, image 111) which measures 3 mm and was not definitely seen on prior exam. Multiple nonobstructive right renal calculus measure up to 11 x 4 mm in size. Bilateral renal cysts appear unchanged. No calculi on the left. No hydronephrosis on either side. The  bladder is unremarkable. Stomach/Bowel: Stomach is within normal limits. There is mild distention of the rectum with stool and mild circumferential wall thickening. There is colonic diverticulosis without evidence of diverticulitis. No evidence of bowel wall thickening, distention, or inflammatory changes. Vascular/Lymphatic: Aortic atherosclerosis. No  enlarged abdominal or pelvic lymph nodes. Reproductive: Status post hysterectomy. No adnexal masses. Other: No abdominal wall hernia or abnormality. No abdominopelvic ascites. Musculoskeletal: Degenerative changes are seen in the spine. IMPRESSION: 1. No acute traumatic injury in the chest, abdomen, or pelvis. 2. Moderate to large right and small left pleural effusions with associated atelectasis. Moderate patchy bilateral apical ground-glass opacities may reflect pneumonia. 3. Three calcifications in the right ureter appear similar to prior exam. One possible distal right ureteral calculus/density was not definitely seen on prior exam. No hydronephrosis on either side. 4. Mild distention of the rectum with stool and mild circumferential wall thickening may reflect stercoral colitis. Aortic Atherosclerosis (ICD10-I70.0). Electronically Signed   By: Romona Curls M.D.   On: 11/23/2022 18:01   CT Head Wo Contrast  Result Date: 11/23/2022 CLINICAL DATA:  Fall, head trauma EXAM: CT HEAD WITHOUT CONTRAST CT CERVICAL SPINE WITHOUT CONTRAST TECHNIQUE: Multidetector CT imaging of the head and cervical spine was performed following the standard protocol without intravenous contrast. Multiplanar CT image reconstructions of the cervical spine were also generated. RADIATION DOSE REDUCTION: This exam was performed according to the departmental dose-optimization program which includes automated exposure control, adjustment of the mA and/or kV according to patient size and/or use of iterative reconstruction technique. COMPARISON:  CT head dated 07/26/2022 FINDINGS: CT HEAD  FINDINGS Brain: No evidence of acute infarction, hemorrhage, hydrocephalus, extra-axial collection or mass lesion/mass effect. 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 Subcortical white matter and periventricular small vessel ischemic changes. Vascular: Intracranial atherosclerosis. Skull: Normal. Negative for fracture or focal lesion. Sinuses/Orbits: The visualized paranasal sinuses are essentially clear. The mastoid air cells are unopacified. Other: None. CT CERVICAL SPINE FINDINGS Alignment: Normal cervical lordosis. Skull base and vertebrae: No acute fracture. No primary bone lesion or focal pathologic process. Soft tissues and spinal canal: No prevertebral fluid or swelling. No visible canal hematoma. Disc levels: Mild degenerative changes of the mid cervical spine. Spinal canal is patent. Upper chest: Evaluated on dedicated CT chest. Other: None. IMPRESSION: No acute intracranial abnormality. 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 Small vessel ischemic changes. No traumatic injury to the cervical spine. Mild degenerative changes. Electronically Signed   By: Charline Bills M.D.   On: 11/23/2022 17:44   CT Cervical Spine Wo Contrast  Result Date: 11/23/2022 CLINICAL DATA:  Fall, head trauma EXAM: CT HEAD WITHOUT CONTRAST CT CERVICAL SPINE WITHOUT CONTRAST TECHNIQUE: Multidetector CT imaging of the head and cervical spine was performed following the standard protocol without intravenous contrast. Multiplanar CT image reconstructions of the cervical spine were also generated. RADIATION DOSE REDUCTION: This exam was performed according to the departmental dose-optimization program which includes automated exposure control, adjustment of the mA and/or kV according to patient size and/or use of iterative reconstruction technique. COMPARISON:  CT head dated 07/26/2022 FINDINGS: CT HEAD FINDINGS Brain: No evidence of acute infarction, hemorrhage,  hydrocephalus, extra-axial collection or mass lesion/mass effect. 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 Subcortical white matter and periventricular small vessel ischemic changes. Vascular: Intracranial atherosclerosis. Skull: Normal. Negative for fracture or focal lesion. Sinuses/Orbits: The visualized paranasal sinuses are essentially clear. The mastoid air cells are unopacified. Other: None. CT CERVICAL SPINE FINDINGS Alignment: Normal cervical lordosis. Skull base and vertebrae: No acute fracture. No primary bone lesion or focal pathologic process. Soft tissues and spinal canal: No prevertebral fluid or swelling. No visible canal hematoma. Disc levels: Mild degenerative changes of the mid  cervical spine. Spinal canal is patent. Upper chest: Evaluated on dedicated CT chest. Other: None. IMPRESSION: No acute intracranial abnormality. 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 Small vessel ischemic changes. No traumatic injury to the cervical spine. Mild degenerative changes. Electronically Signed   By: Charline Bills M.D.   On: 11/23/2022 17:44        Scheduled Meds:  apixaban  5 mg Oral BID   aspirin EC  81 mg Oral Daily   atorvastatin  80 mg Oral Daily   dorzolamide-timolol  1 drop Both Eyes TID   fentaNYL (SUBLIMAZE) injection  25 mcg Intravenous Once   levothyroxine  100 mcg Oral Q0600   mirabegron ER  25 mg Oral Daily   Netarsudil Dimesylate  1 drop Both Eyes QPM   senna  2 tablet Oral QHS   sertraline  50 mg Oral QHS   Continuous Infusions:  ceFEPime (MAXIPIME) IV 2 g (11/24/22 0834)     LOS: 0 days    Time spent: 52 minutes spent on chart review, discussion with nursing staff, consultants, updating family and interview/physical exam; more than 50% of that time was spent in counseling and/or coordination of care.    Alvira Philips [place], DO [facility] Available via Epic secure chat 7am-7pm After these hours, please  refer to coverage provider listed on [email or web address] 11/24/2022, 11:13 AM

## 2022-11-24 NOTE — Evaluation (Signed)
Clinical/Bedside Swallow Evaluation Patient Details  Name: Yvonne Bailey MRN: [id number] Date of Birth: 1941-02-06  Today's Date: 11/24/2022 Time: SLP Start Time (ACUTE ONLY): 1432 SLP Stop Time (ACUTE ONLY): 1451 SLP Time Calculation (min) (ACUTE ONLY): 19 min  Past Medical History:  Past Medical History:  Diagnosis Date   Bilateral leg edema 07/19/2015   CAD (coronary artery disease)    a. anterior MI s/p PCI in 1992. b. cath 08/2015 with severe three-vessel CAD turned down for CABG and underwent DESx5 to prox Cx/OM2/D1/oRCA/mRCA   Carotid artery disease (HCC)    a. carotid duplex 03/2015 showed 1-39% BICA, normal subclavian arteries, chronically occluded left vertebral, f/u recommended only PRN.   DVT (deep venous thrombosis) (HCC)    X1   History of nuclear stress test    a. Myoview 6/17: EF 20-25%, mid anteroseptal, apical anterior, apical septal, apical inferior, apical lateral and apical scar, no ischemia, intermediate risk   HTN (hypertension)    Hyperlipidemia    Hypokalemia    Hypothyroidism    Ischemic cardiomyopathy    a. Echo 6/17: EF 20-25%, apex appears akinetic, MAC, moderate MR, moderate LAE, mild RVE, trivial PI, PASP 47 mmHg (needs repeat with Definity contrast).  b. Limited echo with Definity contrast 7/17: EF 25-30%, moderate to severe LAE. c. Limited Echo 2018 showed EF 40-45%.   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 02/2017 in setting of noncompliance with Eliquis   TIA (transient ischemic attack)    Tricuspid regurgitation    Past Surgical History:  Past Surgical History:  Procedure Laterality Date    APPENDECTOMY     at hysterectomy and USO for fibroids, Dr. Kizzie Bane   CARDIAC CATHETERIZATION  1992   Dr Charlies Constable   CARDIAC CATHETERIZATION N/A 09/06/2015   Procedure: Right/Left Heart Cath and Coronary Angiography;  Surgeon: Kathleene Hazel, MD;  Location: [facility] INVASIVE CV LAB;  Service: Cardiovascular;  Laterality: N/A;   CARDIAC CATHETERIZATION N/A 09/07/2015   Procedure: Coronary Stent Intervention;  Surgeon: Kathleene Hazel, MD;  Location: [facility] INVASIVE CV LAB;  Service: Cardiovascular;  Laterality: N/A;   COLONOSCOPY     negative; 2008, Dr. Lina Sar   fracture LLE     '94; pinned   IR ANGIO INTRA EXTRACRAN SEL COM CAROTID INNOMINATE BILAT MOD SED  03/02/2017   IR ANGIO INTRA EXTRACRAN SEL COM CAROTID INNOMINATE BILAT MOD SED  04/23/2018   IR ANGIO VERTEBRAL SEL SUBCLAVIAN INNOMINATE BILAT MOD SED  04/23/2018   IR ANGIO VERTEBRAL SEL VERTEBRAL UNI R MOD SED  03/02/2017   IR RADIOLOGIST EVAL & MGMT  04/28/2017   IR US GUIDE VASC ACCESS RIGHT  04/23/2018   RADIOLOGY WITH ANESTHESIA N/A 03/02/2017   Procedure: RADIOLOGY WITH ANESTHESIA;  Surgeon: Julieanne Cotton, MD;  Location: [facility] OR;  Service: Radiology;  Laterality: N/A;   TEE WITHOUT CARDIOVERSION N/A 11/02/2020   Procedure: TRANSESOPHAGEAL ECHOCARDIOGRAM (TEE);  Surgeon: Thurmon Fair, MD;  Location: [facility] ENDOSCOPY;  Service: Cardiovascular;  Laterality: N/A;   TONSILLECTOMY     TOTAL ABDOMINAL HYSTERECTOMY     & BSO for  fibroids   HPI:  Yvonne Bailey is an 82 yo female presenting to ED 9/14 with AMS after a ground level fall. Found to have UTI with subsequent delirium and encephalopathy. CT Chest Abdomen Pelvis with moderate to large R and small L pleural effusions with associated atelectasis as well as moderate patchy bilateral apical ground-glass opacities which may be representative of PNA. CXR pending. Pt seen most recently by SLP for cognition 06/2022, but prior to that was seen for swallowing in 2018 without  s/s of dysphagia or aspiration. PMH includes dementia, CVA, CAD, HfrEF, A-fib, PE on AC, CKD III, hypothyroidism, HTN, HLD, pre DM    Assessment / Plan / Recommendation  Clinical Impression  Pt reports occasionally coughing while eating and drinking. Oral motor exam WFL, although missing dentition. Consecutive sips of thin liquids via straw resulted in immediate coughing, which was only intermittently resolved by cueing to take small, controlled sips. Purees did not result in any clinical signs of dysphagia, although pt had increased oral residue with regular texture solids. Provided education regarding swallowing safety and completing an MBS to further assess function. Recommend she remain NPO except for meds given in purees pending results of MBS, which will be completed as scheduling allows. SLP Visit Diagnosis: Dysphagia, unspecified (R13.10)    Aspiration Risk  Mild aspiration risk    Diet Recommendation NPO    Medication Administration: Crushed with puree    Other  Recommendations Oral Care Recommendations: Oral care QID    Recommendations for follow up therapy are one component of a multi-disciplinary discharge planning process, led by the attending physician.  Recommendations may be updated based on patient status, additional functional criteria and insurance authorization.  Follow up Recommendations Skilled nursing-short term rehab (<3 hours/day)      Assistance Recommended at Discharge    Functional Status Assessment Patient has had a recent decline in their functional status and demonstrates the ability to make significant improvements in function in a reasonable and predictable amount of time.  Frequency and Duration min 2x/week  2 weeks       Prognosis Prognosis for improved oropharyngeal function: Good Barriers to Reach Goals: Cognitive deficits      Swallow Study   General HPI: Yvonne Bailey is an 82 yo female presenting to ED 9/14 with AMS after a ground level  fall. Found to have UTI with subsequent delirium and encephalopathy. CT Chest Abdomen Pelvis with moderate to large R and small L pleural effusions with associated atelectasis as well as moderate patchy bilateral apical ground-glass opacities which may be representative of PNA. CXR pending. Pt seen most recently by SLP for cognition 06/2022, but prior to that was seen for swallowing in 2018 without s/s of dysphagia or aspiration. PMH includes dementia, CVA, CAD, HfrEF, A-fib, PE on AC, CKD III, hypothyroidism, HTN, HLD, pre DM Type of Study: Bedside Swallow Evaluation Previous Swallow Assessment: see HPI Diet Prior to this Study: NPO Temperature Spikes Noted: No Respiratory Status: Room air History of Recent Intubation: No Behavior/Cognition: Alert;Cooperative;Pleasant mood Oral Cavity Assessment: Within Functional Limits Oral Care Completed by SLP: No Oral Cavity - Dentition: Missing dentition;Poor condition Vision: Functional for self-feeding Self-Feeding Abilities: Needs assist Patient Positioning: Upright in bed Baseline Vocal Quality: Normal Volitional Cough: Strong Volitional Swallow: Able to elicit    Oral/Motor/Sensory Function Overall Oral Motor/Sensory Function: Within functional limits   Ice Chips Ice chips: Not tested   Thin Liquid Thin Liquid: Impaired Presentation: Straw Pharyngeal  Phase Impairments: Cough -  Immediate    Nectar Thick Nectar Thick Liquid: Not tested   Honey Thick Honey Thick Liquid: Not tested   Puree Puree: Within functional limits Presentation: Spoon   Solid     Solid: Impaired Presentation: Self Fed Oral Phase Functional Implications: Oral residue      Gwynneth Aliment, M.A., CF-SLP Speech Language Pathology, Acute Rehabilitation Services  Secure Chat preferred [phone]  11/24/2022,3:03 PM

## 2022-11-24 NOTE — Evaluation (Addendum)
Physical Therapy Evaluation Patient Details Name: Yvonne Bailey MRN: [id number] DOB: 11-Nov-1940 Today's Date: 11/24/2022  History of Present Illness  Pt is an 82 y/o F presenting to ED on 9/14 after ground level fall with AMS. Admitted for UTI, CT with moderate pleural effusion. PMH includes dementia, CVA, CAD, CHF, A fib on Eliquis, CKD IIIA, hypothyroidism, HTN HLD, prediabetes   Clinical Impression  Pt presents with condition above and deficits mentioned below, see PT Problem List. PTA, she was residing with her son in a 1-level house with 6 STE. She was ambulatory utilizing a RW at baseline. Currently, pt is demonstrating deficits in cognition, balance, power, activity tolerance, and strength. She laughs uncontrollably and out of context often. Pt is limited by her bil lower leg pain today. Her R leg is more painful than the L and the pain was reproduced with movement, weightbearing, and palpation at the mid-lower leg, specifically with deep palpation of the posterior musculature. No redness or warmth noted and did not note radiographs of this region in her chart. Notified RN and MD. Pt demonstrates difficulty initiating and remaining on task, needing maxA to transition supine <> sit EOB and modA to roll. She was able to pull up on the posterior aspect of the recliner to come to a half stand with maxA, but she was unable to stand fully upright and thus unable to take steps today. She would benefit from short-term inpatient rehab, < 3 hours/day. Will continue to follow acutely.       If plan is discharge home, recommend the following: Two people to help with walking and/or transfers;A lot of help with bathing/dressing/bathroom;Assistance with cooking/housework;Direct supervision/assist for medications management;Direct supervision/assist for financial management;Assist for transportation;Help with stairs or ramp for entrance   Can travel by private vehicle   No    Equipment Recommendations  Wheelchair (measurements PT);Wheelchair cushion (measurements PT);Hospital bed;Hoyer lift (pending progress)  Recommendations for Other Services       Functional Status Assessment Patient has had a recent decline in their functional status and demonstrates the ability to make significant improvements in function in a reasonable and predictable amount of time.     Precautions / Restrictions Precautions Precautions: Fall Restrictions Weight Bearing Restrictions: No      Mobility  Bed Mobility Overal bed mobility: Needs Assistance Bed Mobility: Sit to Supine, Rolling, Supine to Sit Rolling: Mod assist, Used rails Sidelying to sit: Mod assist Supine to sit: Max assist, HOB elevated Sit to supine: Max assist, HOB elevated   General bed mobility comments: Cues and physical guidance needed to reach rails to roll, modA to rotate trunk. MaxA to bring legs off EOB and pivot hips using bed pad to sit up L EOB, poor initiation by pt. MaxA to lift legs and return pt to supine.    Transfers Overall transfer level: Needs assistance Equipment used:  (posterior aspect of recliner) Transfers: Sit to/from Stand Sit to Stand: Max assist, From elevated surface           General transfer comment: Elevated EOB and cued pt to pull up on posterior aspect of recliner with pt demonstrating initiation but needing maxA to power up to a half stand, noted pt's feet sliding anteriorly    Ambulation/Gait               General Gait Details: unable at this time  Careers information officer     Tilt Bed  Modified Rankin (Stroke Patients Only) Modified Rankin (Stroke Patients Only) Pre-Morbid Rankin Score: Moderate disability Modified Rankin: Severe disability     Balance Overall balance assessment: Needs assistance Sitting-balance support: Feet supported, No upper extremity supported Sitting balance-Leahy Scale: Fair     Standing balance support: During functional  activity, Reliant on assistive device for balance, Bilateral upper extremity supported Standing balance-Leahy Scale: Poor Standing balance comment: Reliant on UE support and maxA to stand half way up, unable to obtain full upright standing posture today                             Pertinent Vitals/Pain Pain Assessment Pain Assessment: Faces Faces Pain Scale: Hurts little more Pain Location: Bil lower legs (R>L) (reproduced with movement, weight bearing, and pressure at mid-calf and deep palpation of calf muscles on R, did not note any radiographs or redness and warmth - notified RN) Pain Descriptors / Indicators: Discomfort, Grimacing, Guarding, Sore Pain Intervention(s): Limited activity within patient's tolerance, Monitored during session, Repositioned    Home Living Family/patient expects to be discharged to:: Private residence Living Arrangements: Children (son) Available Help at Discharge: Family;Available PRN/intermittently;Personal care attendant Type of Home: House Home Access: Stairs to enter Entrance Stairs-Rails: Right Entrance Stairs-Number of Steps: 6   Home Layout: One level Home Equipment: Agricultural consultant (2 wheels);BSC/3in1;Shower seat;Cane - single point Additional Comments: [facility] to [facility] center 5 [facility] from 8:30am-4:30pm    Prior Function Prior Level of Function : Needs assist             Mobility Comments: ambulates with RW ADLs Comments: has assist with ADLs/IADLs     Extremity/Trunk Assessment   Upper Extremity Assessment Upper Extremity Assessment: Defer to OT evaluation    Lower Extremity Assessment Lower Extremity Assessment: Generalized weakness;RLE deficits/detail;LLE deficits/detail RLE Deficits / Details: noted pain in lower leg, worse on R than on L, reproduced with movement, weight bearing, and pressure at mid-calf and deep palpation of calf muscles on R, did not note any radiographs or redness or warmth to palpation -  notified RN; gross weakness LLE Deficits / Details: pain in lower leg; gross weakness    Cervical / Trunk Assessment Cervical / Trunk Assessment: Kyphotic  Communication   Communication Communication: Difficulty communicating thoughts/reduced clarity of speech Cueing Techniques:  (wordfinding difficulties at times)  Cognition Arousal: Alert Behavior During Therapy: Lability (frequently laughing) Overall Cognitive Status: No family/caregiver present to determine baseline cognitive functioning                                 General Comments: pt with hx of dementia, aware she is in the hospital for a fall, disoriented to time, follows commands with repetition/incr time, laughs sporadically throughout session        General Comments General comments (skin integrity, edema, etc.): VSS on RA    Exercises     Assessment/Plan    PT Assessment Patient needs continued PT services  PT Problem List Decreased strength;Decreased activity tolerance;Decreased range of motion;Decreased balance;Decreased mobility;Decreased cognition;Decreased coordination;Decreased knowledge of use of DME;Decreased safety awareness;Pain       PT Treatment Interventions DME instruction;Gait training;Stair training;Functional mobility training;Therapeutic activities;Therapeutic exercise;Balance training;Neuromuscular re-education;Cognitive remediation;Patient/family education    PT Goals (Current goals can be found in the Care Plan section)  Acute Rehab PT Goals Patient Stated Goal: to reduce pain PT Goal Formulation: With patient  Time For Goal Achievement: 12/08/22 Potential to Achieve Goals: Good    Frequency Min 1X/week     Co-evaluation               AM-PAC PT "6 Clicks" Mobility  Outcome Measure Help needed turning from your back to your side while in a flat bed without using bedrails?: A Lot Help needed moving from lying on your back to sitting on the side of a flat bed  without using bedrails?: A Lot Help needed moving to and from a bed to a chair (including a wheelchair)?: Total Help needed standing up from a chair using your arms (e.g., wheelchair or bedside chair)?: A Lot Help needed to walk in hospital room?: Total Help needed climbing 3-5 steps with a railing? : Total 6 Click Score: 9    End of Session Equipment Utilized During Treatment: Gait belt Activity Tolerance: Patient limited by pain Patient left: in bed;with call bell/phone within reach;with bed alarm set Nurse Communication: Mobility status;Other (comment) (location of pain and reproduction of pain) PT Visit Diagnosis: Unsteadiness on feet (R26.81);Other abnormalities of gait and mobility (R26.89);Muscle weakness (generalized) (M62.81);History of falling (Z91.81);Difficulty in walking, not elsewhere classified (R26.2);Pain Pain - Right/Left:  (bil) Pain - part of body: Leg    Time: 8469-6295 PT Time Calculation (min) (ACUTE ONLY): 23 min   Charges:   PT Evaluation $PT Eval Moderate Complexity: 1 Mod PT Treatments $Therapeutic Activity: 8-22 mins PT General Charges $$ ACUTE PT VISIT: 1 Visit         Virgil Benedict, PT, DPT Acute Rehabilitation Services  Office: [phone]   Bettina Gavia 11/24/2022, 5:38 PM

## 2022-11-24 NOTE — Care Management Obs Status (Signed)
MEDICARE OBSERVATION STATUS NOTIFICATION   Patient Details  Name: Yvonne Bailey MRN: [id number] Date of Birth: [birth year]   Medicare Observation Status Notification Given:  Yes    Ronny Bacon, RN 11/24/2022, 1:39 PM

## 2022-11-24 NOTE — Evaluation (Signed)
Occupational Therapy Evaluation Patient Details Name: Yvonne Bailey MRN: [id number] DOB: Oct 13, 1940 Today's Date: 11/24/2022   History of Present Illness Pt is an 82 y/o F presenting to ED on 9/14 after ground level fall with AMS. Admitted for UTI, CT with moderate pleural effusion. PMH includes dementia, CVA, CAD, CHF, A fib on Eliquis, CKD IIIA, hypothyroidism, HTN HLD, prediabetes   Clinical Impression   Pt questionable historian, reports use of RW for mobility and has assist for ADLs, lives with son and reports going to senior center 5 days a week from 8:30-4:30pm. Pt currently needing set up -max A for ADLs, mod-max A +2 for bed mobility and max-total A +2 for standing attempts from EOB. Pt reports RLE pain but is unable to specify where when standing. Pt presenting with impairments listed below, will follow acutely. Patient will benefit from continued inpatient follow up therapy, <3 hours/day to maximize safety/ind with ADLs/functional mobility.         If plan is discharge home, recommend the following: Two people to help with walking and/or transfers;A lot of help with bathing/dressing/bathroom;Assistance with cooking/housework;Direct supervision/assist for medications management;Direct supervision/assist for financial management;Assist for transportation;Help with stairs or ramp for entrance    Functional Status Assessment  Patient has had a recent decline in their functional status and demonstrates the ability to make significant improvements in function in a reasonable and predictable amount of time.  Equipment Recommendations  Other (comment) (defer)    Recommendations for Other Services PT consult     Precautions / Restrictions Precautions Precautions: Fall Restrictions Weight Bearing Restrictions: No      Mobility Bed Mobility Overal bed mobility: Needs Assistance Bed Mobility: Sidelying to Sit, Sit to Supine   Sidelying to sit: Mod assist   Sit to supine:  Max assist, +2 for physical assistance   General bed mobility comments: mod A with use of bed pad for trunk elevation to sit EOB, max A +2 and use of helicopter method to return to supine    Transfers Overall transfer level: Needs assistance Equipment used: Rolling walker (2 wheels), 2 person hand held assist Transfers: Sit to/from Stand Sit to Stand: Max assist, Total assist, +2 physical assistance           General transfer comment: use of bed pad and gait belt, able to minimally lift hips off of  elevated bed height, pt pushing backward with feet sliding forward with standing attempts      Balance Overall balance assessment: Needs assistance Sitting-balance support: Feet supported Sitting balance-Leahy Scale: Fair     Standing balance support: During functional activity, Reliant on assistive device for balance Standing balance-Leahy Scale: Poor                             ADL either performed or assessed with clinical judgement   ADL Overall ADL's : Needs assistance/impaired Eating/Feeding: Set up   Grooming: Set up   Upper Body Bathing: Moderate assistance   Lower Body Bathing: Maximal assistance   Upper Body Dressing : Moderate assistance   Lower Body Dressing: Maximal assistance   Toilet Transfer: Maximal assistance;+2 for physical assistance   Toileting- Clothing Manipulation and Hygiene: Maximal assistance       Functional mobility during ADLs: Maximal assistance;+2 for physical assistance       Vision Baseline Vision/History: 3 Glaucoma Additional Comments: 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, can see out of R eye  Perception Perception: Not tested       Praxis Praxis: Not tested       Pertinent Vitals/Pain Pain Assessment Pain Assessment: Faces Pain Score: 4  Faces Pain Scale: Hurts little more Pain Location: RLE with standing attempts Pain Descriptors / Indicators: Discomfort Pain Intervention(s): Limited activity within  patient's tolerance, Monitored during session, Repositioned     Extremity/Trunk Assessment Upper Extremity Assessment Upper Extremity Assessment: Generalized weakness   Lower Extremity Assessment Lower Extremity Assessment: Defer to PT evaluation   Cervical / Trunk Assessment Cervical / Trunk Assessment: Kyphotic   Communication Communication Communication: Difficulty communicating thoughts/reduced clarity of speech Cueing Techniques:  (wordfinding difficulties at times)   Cognition Arousal: Alert Behavior During Therapy: Lability (frequently laughing) Overall Cognitive Status: No family/caregiver present to determine baseline cognitive functioning                                 General Comments: pt with hx of dementia, aware she is in the hospital for a fall, disoriented to time, follows commands with repetition/incr time     General Comments  VSS on RA    Exercises     Shoulder Instructions      Home Living Family/patient expects to be discharged to:: Private residence Living Arrangements: Children (son) Available Help at Discharge: Family;Available PRN/intermittently;Personal care attendant Type of Home: House Home Access: Stairs to enter [employer] of Steps: 6 Entrance Stairs-Rails: Right Home Layout: One level     Bathroom Shower/Tub: Producer, television/film/video: Handicapped height Bathroom Accessibility: Yes   Home Equipment: Agricultural consultant (2 wheels);BSC/3in1;Shower seat;Cane - single point   Additional Comments: pt goes to senior center 5 days/week from 8:30am-4:30pm      Prior Functioning/Environment Prior Level of Function : Needs assist             Mobility Comments: ambulates with RW ADLs Comments: has assist with ADLs/IADLs        OT Problem List: Decreased strength;Decreased range of motion;Decreased activity tolerance;Impaired balance (sitting and/or standing);Decreased safety awareness;Decreased  cognition      OT Treatment/Interventions: Self-care/ADL training;Therapeutic exercise;Energy conservation;DME and/or AE instruction;Therapeutic activities;Patient/family education;Balance training    OT Goals(Current goals can be found in the care plan section) Acute Rehab OT Goals Patient Stated Goal: none stated OT Goal Formulation: With patient Time For Goal Achievement: 12/08/22 Potential to Achieve Goals: Good ADL Goals Pt Will Perform Upper Body Dressing: with min assist;sitting Pt Will Perform Lower Body Dressing: with min assist;sitting/lateral leans;sit to/from stand Pt Will Transfer to Toilet: with min assist;ambulating;regular height toilet Additional ADL Goal #1: pt will perform bed mobility min A in prep for ADLs  OT Frequency: Min 1X/week    Co-evaluation              AM-PAC OT "6 Clicks" Daily Activity     Outcome Measure Help from another person eating meals?: A Little Help from another person taking care of personal grooming?: A Little Help from another person toileting, which includes using toliet, bedpan, or urinal?: A Lot Help from another person bathing (including washing, rinsing, drying)?: A Lot Help from another person to put on and taking off regular upper body clothing?: A Lot Help from another person to put on and taking off regular lower body clothing?: A Lot 6 Click Score: 14   End of Session Equipment Utilized During Treatment: Gait belt;Rolling walker (2 wheels) Nurse Communication: Mobility status  Activity Tolerance: Patient tolerated treatment well Patient left: in bed;with call bell/phone within reach;with bed alarm set  OT Visit Diagnosis: Unsteadiness on feet (R26.81);Other abnormalities of gait and mobility (R26.89);Muscle weakness (generalized) (M62.81);History of falling (Z91.81)                Time: 0865-7846 OT Time Calculation (min): 35 min Charges:  OT General Charges $OT Visit: 1 Visit OT Evaluation $OT Eval Moderate  Complexity: 1 Mod OT Treatments $Therapeutic Activity: 8-22 mins  Carver Fila, OTD, OTR/L SecureChat Preferred Acute Rehab [phone]   Carver Fila Koonce 11/24/2022, 2:48 PM

## 2022-11-25 ENCOUNTER — Inpatient Hospital Stay (HOSPITAL_COMMUNITY): Payer: Medicare HMO

## 2022-11-25 DIAGNOSIS — N390 Urinary tract infection, site not specified: Secondary | ICD-10-CM | POA: Diagnosis not present

## 2022-11-25 DIAGNOSIS — I5022 Chronic systolic (congestive) heart failure: Secondary | ICD-10-CM

## 2022-11-25 LAB — BRAIN NATRIURETIC PEPTIDE: B Natriuretic Peptide: 207 pg/mL — ABNORMAL HIGH (ref 0.0–100.0)

## 2022-11-25 LAB — BASIC METABOLIC PANEL
Anion gap: 9 (ref 5–15)
BUN: 10 mg/dL (ref 8–23)
CO2: 26 mmol/L (ref 22–32)
Calcium: 8.5 mg/dL — ABNORMAL LOW (ref 8.9–10.3)
Chloride: 106 mmol/L (ref 98–111)
Creatinine, Ser: 0.97 mg/dL (ref 0.44–1.00)
GFR, Estimated: 58 mL/min — ABNORMAL LOW (ref >60)
Glucose, Bld: 101 mg/dL — ABNORMAL HIGH (ref 70–99)
Potassium: 3.8 mmol/L (ref 3.5–5.1)
Sodium: 141 mmol/L (ref 135–145)

## 2022-11-25 LAB — ECHOCARDIOGRAM COMPLETE
Area-P 1/2: 4.15 cm2
Calc EF: 32.3 %
Height: 67 in
MV M vel: 6.14 m/s
MV Peak grad: 150.8 mmHg
Radius: 0.3 cm
S' Lateral: 4.6 cm
Single Plane A2C EF: 29.1 %
Single Plane A4C EF: 36.1 %
Weight: 2818.36 [oz_av]

## 2022-11-25 LAB — URINE CULTURE: Culture: <10000 — AB

## 2022-11-25 LAB — HEMOGLOBIN A1C
Hgb A1c MFr Bld: 5.3 % (ref 4.8–5.6)
Mean Plasma Glucose: 105 mg/dL

## 2022-11-25 LAB — MAGNESIUM: Magnesium: 2.2 mg/dL (ref 1.7–2.4)

## 2022-11-25 MED ORDER — MELATONIN 3 MG PO TABS
3.0000 mg | ORAL_TABLET | Freq: Every day | ORAL | Status: DC
  Administered 2022-11-25 – 2022-11-26 (×2): 3 mg via ORAL
  Filled 2022-11-25 (×2): qty 1

## 2022-11-25 NOTE — Evaluation (Signed)
Modified Barium Swallow Study  Patient Details  Name: Yvonne Bailey MRN: [id number] Date of Birth: [birth year]  Today's Date: 11/25/2022  Modified Barium Swallow completed.  Full report located under Chart Review in the Imaging Section.  History of Present Illness Yvonne Bailey is an 82 yo female presenting to ED 9/14 with AMS after a ground level fall. Found to have UTI with subsequent delirium and encephalopathy. CT Chest Abdomen Pelvis with moderate to large R and small L pleural effusions with associated atelectasis as well as moderate patchy bilateral apical ground-glass opacities which may be representative of PNA. CXR pending. Pt seen most recently by SLP for cognition 06/2022, but prior to that was seen for swallowing in 2018 without s/s of dysphagia or aspiration. PMH includes dementia, CVA, CAD, HfrEF, A-fib, PE on AC, CKD III, hypothyroidism, HTN, HLD, pre DM   Clinical Impression Patient presents with an intact, functional oropharyngeal swallow. Oral phase inconsistently prolonged and swallow initiation inconsistently initiated at the level of the pyriform sinuses, suspected secondary to decreased cognition, but with full airway protection across consistencies. Appearance of a possible osteophyte at C5-6 causes a minimal amount of residue in esophagus post swallow but did not overall impact function. Will initiate a diet and f/u briefly for tolerance. Factors that may increase risk of adverse event in presence of aspiration Yvonne Bailey 2021): Reduced cognitive function  Swallow Evaluation Recommendations Recommendations: PO diet PO Diet Recommendation: Dysphagia 2 (Finely chopped);Thin liquids (Level 0) Liquid Administration via: Cup;Straw Medication Administration: Whole meds with puree (crush if needed) Supervision: Staff to assist with self-feeding;Full supervision/cueing for swallowing strategies Swallowing strategies  : Slow rate;Small bites/sips Postural  changes: Position pt fully upright for meals Oral care recommendations: Oral care BID (2x/day)    Yvonne Derousse MA, CCC-SLP   Yvonne Bailey Meryl 11/25/2022,1:51 PM

## 2022-11-25 NOTE — Plan of Care (Signed)
  Problem: Education: Goal: Ability to demonstrate management of disease process will improve Outcome: Not Met (add Reason) Goal: Ability to verbalize understanding of medication therapies will improve Outcome: Not Met (add Reason) Goal: Individualized Educational Video(s) Outcome: Not Met (add Reason)

## 2022-11-25 NOTE — Progress Notes (Signed)
Heart Failure Navigator Progress Note  Assessed for Heart & Vascular TOC clinic readiness.  Patient does not meet criteria due to per MD note patient with Dementia. .   Navigator will sign off at this time.   Rhae Hammock, BSN, Scientist, clinical (histocompatibility and immunogenetics) Only

## 2022-11-25 NOTE — Progress Notes (Signed)
Patient awake all night long

## 2022-11-25 NOTE — Progress Notes (Signed)
PROGRESS NOTE    Yvonne Bailey  VHQ:[id number] DOB: Dec 10, 1940 DOA: 11/23/2022 PCP: Pincus Sanes, MD    Brief Narrative:   Yvonne Bailey is a 82 y.o. female with past medical history significant for dementia, CVA, CAD, chronic systolic congestive heart failure, atrial fibrillation on Eliquis, CKD stage IIIa, hypothyroidism, HTN, HLD, prediabetes who presented to [facility] ED on 9/14 with confusion and ground-level fall.  History limited due to underlying dementia.  Son reports patient was in the bathroom and trying to help her off the toilet in which she fell onto the ground.  Denies injuries or loss of consciousness but with global weakness.  Patient reported recent dysuria and seen by PCP and prescribed course of Macrobid in which she has completed 3 days.  Denied fever, no chills, no cough, no abdominal pain, no nausea/vomiting.   In the ED, temperature 98.1 F, HR 95, RR 17, BP 173/102, SpO2 96% on room air.  WBC 9.2, hemoglobin 12.5, platelet count 222.  Sodium 142, potassium 3.7, chloride 107, CO2 27, glucose 101, BUN 12, creatinine 0.97.  AST 26, ALT 19, total bilirubin 1.4.  BNP 210.3.  High sensitive troponin 22>22>22.  Lactic acid 1.1.  COVID/influenza/RSV PCR negative.  Urinalysis with negative leukocytes, negative nitrite, no bacteria, 0-5 WBCs.  CT head/C-spine with no acute intracranial Gershon Mussel, old left parietal infarct, old right basal ganglia lacunar infarct, small vessel ischemic changes, no traumatic injury to the cervical spine with mild noted degenerative changes.  Right femur x-ray with no acute abnormality.  CT chest/abdomen/pelvis with no acute traumatic injury in the chest/abdomen/pelvis, moderate to large right pleural effusion associated with atelectasis, moderate patchy bilateral apical groundglass opacities possibly reflective of pneumonia, 3 calcifications right ureter appears similar to prior, 1 possible distal right ureteral calculus, no hydronephrosis, mild  distention of the rectum with stool and mild circumferential wall thickening likely reflective of stercoral colitis.  Blood cultures x 2 obtained.  EDP ordered azithromycin, ceftriaxone, metronidazole, Fleet enema, 1 L LR bolus.  TRH consulted for admission for further evaluation and management of acute metabolic encephalopathy, concern for community-acquired pneumonia and possible UTI, weakness and fall.  Assessment & Plan:   Acute metabolic encephalopathy: Resolved Patient presenting with confusion, was afebrile without leukocytosis.  Recent urinary tract infection treated with outpatient antibiotics as well as possible underlying pneumonia noted on CT chest.  Patient received IV antibiotics and IV fluid hydration with improvement in mental status now appears to be at her typical baseline.  Complicated by her underlying dementia. -- Continue treatment as below  Community-acquired pneumonia CT chest with moderate patchy bilateral apical groundglass opacities possibly reflective of pneumonia.  Patient is afebrile without leukocytosis. -- Blood cultures x 2: No growth less than 24 hours -- Cefepime 2 g IV every 12 hours  History of recent UTI Recently treated with 3-day course of Macrobid.  Had been complaining of dysuria.  Previous history of Klebsiella, E. coli, Proteus UTI.  Urinalysis relatively unrevealing.  Urine culture with less than 10K insignificant growth. -- Continue cefepime as above  Constipation/stercoral colitis CT abdomen/pelvis with distention of rectum with stool and wall thickening concerning for sterile coral colitis.  Underwent fecal disimpaction in the ED. -- Senokot-S 2 tablets p.o. twice daily -- Bisacodyl suppository 10 mg PR as needed moderate constipation -- Continue to monitor bowel movements  Hypokalemia Potassium 3.2, magnesium 1.8, will replete. -- Repeat electrolytes in the a.m.  Dysphagia With apparent unwitnessed aspiration event on 9/15.  Seen  by SLP,  modified barium swallow completed on 11/25/2022. Dysphagia 2 (Finely chopped);Thin liquids (Level 0) Liquid Administration via: Cup;Straw Medication Administration: Whole meds with puree (crush if needed) Supervision: Staff to assist with self-feeding;Full supervision/cueing for swallowing strategies Swallowing strategies  : Slow rate;Small bites/sips Postural changes: Position pt fully upright for meals Oral care recommendations: Oral care BID (2x/day) Aspiration precautions  Chronic systolic congestive heart failure, compensated Moderate right pleural effusion TEE 11/02/2020 with LVEF 35-40%, LV moderately decreased function with regional wall motion abnormalities, diastolic function not evaluated, biatrial enlargement, no aortic stenosis.  Moderate right pleural effusion noted on CT chest.  Oxygenating well on room air. -- TTE: Pending -- Not on ARB, ACE inhibitor, beta-blocker or diuretic at baseline -- started on furosemide 20 mg p.o. daily -- Strict I's and O's and Daily weights  Elevated troponin Elevated troponin of 22 which remained flat x 3.  Etiology likely secondary to type II demand ischemia versus decreased renal clearance with her underlying CKD.  Denies chest pain.  No concerning dynamic changes noted on EKG. -- Monitor on telemetry  Paroxysmal atrial fibrillation Essential hypertension Currently not on antihypertensives or rate controlling medications at baseline.  EKG with atrial fibrillation, rate 72. -- Continue Eliquis -- Monitor on telemetry  Hyperlipidemia -- Atorvastatin 80 mg p.o. daily  Depression -- Sertraline 50 mg p.o. daily  CKD stage IIIa -- Cr 0.97>0.90>1.00>0.97, at baseline  Hypothyroidism TSH low at 0.341, free T4 1.12, within normal limits. -- Levothyroxine 100 mcg p.o. daily  History of prediabetes Hemoglobin A1c 5.2 on 03/18/2022.  Dementia --Delirium precautions --Get up during the day --Encourage a familiar face to remain present  throughout the day --Keep blinds open and lights on during daylight hours --Minimize the use of opioids/benzodiazepines --Melatonin 3 mg p.o. nightly  Weakness/debility/deconditioning/gait disturbance: Ground-level fall Patient presenting with fall from the toilet to the floor, no loss of consciousness.  CT head/C-spine unrevealing, x-ray right hip with no acute fracture/dislocation.  PT/OT recommends SNF placement. --TOC for placement  DVT prophylaxis:  apixaban (ELIQUIS) tablet 5 mg    Code Status: Full Code Family Communication: No family present at bedside this morning  Disposition Plan:  Level of care: Med-Surg Status is: Inpatient Remains inpatient appropriate because: Pending TTE, IV antibiotics, SNF placement      Consultants:  None  Procedures:  TTE: Pending  Antimicrobials:  Cefepime 9/14>>   Subjective: Patient seen examined bedside, resting comfortably.  Pleasantly confused.  Asking for food/water.  RN reports patient more confused overnight, did not sleep at all.  Also refused her medications this morning.  At time of evaluation, patient appears to be back to her baseline, similar to how she was yesterday.  Pending TTE and continues on IV antibiotics.  Will need SNF placement.  Denies headache, no chest pain, no shortness of breath, no abdominal pain.  No acute events overnight per nurse staff.  Objective: Vitals:   11/24/22 1529 11/24/22 2037 11/25/22 0544 11/25/22 0830  BP: (!) 172/110 (!) 156/81 (!) 161/84 (!) 157/95  Pulse:  94 (!) 107 77  Resp:  20  18  Temp:  98 F (36.7 C) 97.6 F (36.4 C) 98.5 F (36.9 C)  TempSrc:      SpO2:  100% 100% 99%  Weight:      Height:        Intake/Output Summary (Last 24 hours) at 11/25/2022 1358 Last data filed at 11/25/2022 0600 Gross per 24 hour  Intake 400 ml  Output  650 ml  Net -250 ml   Filed Weights   11/23/22 1650 11/23/22 2249  Weight: 74.6 kg 79.9 kg    Examination:  Physical Exam: GEN: NAD,  alert and oriented to place/person but not time.  Pleasantly confused, elderly in appearance HEENT: NCAT, PERRL, EOMI, sclera clear, MMM PULM: Diminished breath sounds right base, no wheezing/crackles, normal respiratory effort without accessory muscle use, on room air with SpO2 97% at rest CV: IIR, nomal rate w/o M/G/R GI: abd soft, NTND, NABS, no R/G/M MSK: no peripheral edema, moves all extremities independently with preserved muscle strength that is symmetric NEURO: No focal neurological deficits PSYCH: normal mood/affect Integumentary: No concerning rashes/lesions/wounds noted on exposed skin surfaces.    Data Reviewed: I have personally reviewed following labs and imaging studies  CBC: Recent Labs  Lab 11/23/22 1657 11/23/22 1706 11/23/22 1707 11/23/22 2046  WBC 9.2  --   --  10.9*  NEUTROABS 6.8  --   --   --   HGB 12.5 13.3 13.6 11.6*  HCT 39.3 39.0 40.0 36.7  MCV 100.8*  --   --  99.5  PLT 222  --   --  206   Basic Metabolic Panel: Recent Labs  Lab 11/23/22 1657 11/23/22 1706 11/23/22 1707 11/24/22 0845 11/25/22 0824  NA 142 143 142 137 141  K 3.7 3.7 3.7 3.2* 3.8  CL 107  --  107 106 106  CO2 27  --   --  21* 26  GLUCOSE 101*  --  94 175* 101*  BUN 12  --  13 13 10   CREATININE 0.97  --  0.90 1.00 0.97  CALCIUM 8.9  --   --  8.1* 8.5*  MG 1.9  --   --  1.8 2.2  PHOS  --   --   --  3.3  --    GFR: Estimated Creatinine Clearance: 48.6 mL/min (by C-G formula based on SCr of 0.97 mg/dL). Liver Function Tests: Recent Labs  Lab 11/23/22 1657 11/24/22 0845  AST 26 26  ALT 19 16  ALKPHOS 109 105  BILITOT 1.4* 0.9  PROT 7.4 6.2*  ALBUMIN 3.6 3.2*   No results for input(s): "LIPASE", "AMYLASE" in the last 168 hours. No results for input(s): "AMMONIA" in the last 168 hours. Coagulation Profile: No results for input(s): "INR", "PROTIME" in the last 168 hours. Cardiac Enzymes: Recent Labs  Lab 11/23/22 2322  CKTOTAL 83   BNP (last 3 results) No  results for input(s): "PROBNP" in the last 8760 hours. HbA1C: Recent Labs    11/24/22 0846  HGBA1C 5.3   CBG: No results for input(s): "GLUCAP" in the last 168 hours. Lipid Profile: Recent Labs    11/24/22 0846  CHOL 104  HDL 41  LDLCALC 55  TRIG 39  CHOLHDL 2.5   Thyroid Function Tests: Recent Labs    11/23/22 1657 11/24/22 0846  TSH 0.341*  --   FREET4  --  1.12   Anemia Panel: No results for input(s): "VITAMINB12", "FOLATE", "FERRITIN", "TIBC", "IRON", "RETICCTPCT" in the last 72 hours. Sepsis Labs: Recent Labs  Lab 11/23/22 1707  LATICACIDVEN 1.1    Recent Results (from the past 240 hour(s))  Urine Culture     Status: Abnormal   Collection Time: 11/20/22  2:23 PM   Specimen: Urine  Result Value Ref Range Status   Source: URINE  Final   Status: FINAL  Final   Isolate 1: Escherichia coli (A)  Final  Comment: Greater than 100,000 CFU/mL of Escherichia coli      Susceptibility   Escherichia coli - URINE CULTURE, REFLEX    AMOX/CLAVULANIC 4 Sensitive     AMPICILLIN 4 Sensitive     AMPICILLIN/SULBACTAM <=2 Sensitive     CEFAZOLIN* <=4 Not Reportable      * For infections other than uncomplicated UTI caused by E. coli, K. pneumoniae or P. mirabilis: Cefazolin is resistant if MIC > or = 8 mcg/mL. (Distinguishing susceptible versus intermediate for isolates with MIC < or = 4 mcg/mL requires additional testing.) For uncomplicated UTI caused by E. coli, K. pneumoniae or P. mirabilis: Cefazolin is susceptible if MIC <32 mcg/mL and predicts susceptible to the oral agents cefaclor, cefdinir, cefpodoxime, cefprozil, cefuroxime, cephalexin and loracarbef.     CEFTAZIDIME <=1 Sensitive     CEFEPIME <=1 Sensitive     CEFTRIAXONE <=1 Sensitive     CIPROFLOXACIN <=0.25 Sensitive     LEVOFLOXACIN <=0.12 Sensitive     GENTAMICIN <=1 Sensitive     IMIPENEM <=0.25 Sensitive     NITROFURANTOIN <=16 Sensitive     PIP/TAZO <=4 Sensitive     TOBRAMYCIN <=1 Sensitive      TRIMETH/SULFA* <=20 Sensitive      * For infections other than uncomplicated UTI caused by E. coli, K. pneumoniae or P. mirabilis: Cefazolin is resistant if MIC > or = 8 mcg/mL. (Distinguishing susceptible versus intermediate for isolates with MIC < or = 4 mcg/mL requires additional testing.) For uncomplicated UTI caused by E. coli, K. pneumoniae or P. mirabilis: Cefazolin is susceptible if MIC <32 mcg/mL and predicts susceptible to the oral agents cefaclor, cefdinir, cefpodoxime, cefprozil, cefuroxime, cephalexin and loracarbef. Legend: S = Susceptible  I = Intermediate R = Resistant  NS = Not susceptible SDD = Susceptible Dose Dependent * = Not Tested  NR = Not Reported **NN = See Therapy Comments   Blood Culture (routine x 2)     Status: None (Preliminary result)   Collection Time: 11/23/22  5:00 PM   Specimen: BLOOD LEFT ARM  Result Value Ref Range Status   Specimen Description BLOOD LEFT ARM  Final   Special Requests   Final    BOTTLES DRAWN AEROBIC AND ANAEROBIC Blood Culture adequate volume   Culture   Final    NO GROWTH 2 DAYS Performed at [facility], [address] [place], [place] [address]    Report Status PENDING  Incomplete  Blood Culture (routine x 2)     Status: None (Preliminary result)   Collection Time: 11/23/22  5:00 PM   Specimen: BLOOD RIGHT ARM  Result Value Ref Range Status   Specimen Description BLOOD RIGHT ARM  Final   Special Requests   Final    BOTTLES DRAWN AEROBIC AND ANAEROBIC Blood Culture adequate volume   Culture   Final    NO GROWTH 2 DAYS Performed at [facility], [address], [place], [place] [address]    Report Status PENDING  Incomplete  Urine Culture (for pregnant, neutropenic or urologic patients or patients with an indwelling urinary catheter)     Status: Abnormal   Collection Time: 11/23/22  8:42 PM   Specimen: Urine, Clean Catch  Result Value Ref Range Status   Specimen Description URINE, CLEAN CATCH   Final   Special Requests NONE  Final   Culture (A)  Final    <10,000 COLONIES/mL INSIGNIFICANT GROWTH Performed at [facility], [address]  [place], [place] [address]    Report Status 11/25/2022 FINAL  Final  Resp panel by RT-PCR (RSV, Flu A&B, Covid) Anterior Nasal Swab     Status: None   Collection Time: 11/23/22 10:10 PM   Specimen: Anterior Nasal Swab  Result Value Ref Range Status   SARS Coronavirus 2 by RT PCR NEGATIVE NEGATIVE Final   Influenza A by PCR NEGATIVE NEGATIVE Final   Influenza B by PCR NEGATIVE NEGATIVE Final    Comment: (NOTE) The Xpert Xpress SARS-CoV-2/FLU/RSV plus assay is intended as an aid in the diagnosis of influenza from Nasopharyngeal swab specimens and should not be used as a sole basis for treatment. Nasal washings and aspirates are unacceptable for Xpert Xpress SARS-CoV-2/FLU/RSV testing.  Fact Sheet for Patients: BloggerCourse.com  Fact Sheet for Healthcare Providers: SeriousBroker.it  This test is not yet approved or cleared by the [place] FDA and has been authorized for detection and/or diagnosis of SARS-CoV-2 by FDA under an Emergency Use Authorization (EUA). This EUA will remain in effect (meaning this test can be used) for the duration of the COVID-19 declaration under Section 564(b)(1) of the Act, 21 U.S.C. section 360bbb-3(b)(1), unless the authorization is terminated or revoked.     Resp Syncytial Virus by PCR NEGATIVE NEGATIVE Final    Comment: (NOTE) Fact Sheet for Patients: BloggerCourse.com  Fact Sheet for Healthcare Providers: SeriousBroker.it  This test is not yet approved or cleared by the [place] FDA and has been authorized for detection and/or diagnosis of SARS-CoV-2 by FDA under an Emergency Use Authorization (EUA). This EUA will remain in effect (meaning this test can be used) for the duration of  the COVID-19 declaration under Section 564(b)(1) of the Act, 21 U.S.C. section 360bbb-3(b)(1), unless the authorization is terminated or revoked.  Performed at [facility], [address] [place], [place] [address]          Radiology Studies: DG Swallowing Func-Speech Pathology  Result Date: 11/25/2022 Table formatting from the original result was not included. Modified Barium Swallow Study Patient Details Name: Yvonne Bailey MRN: [id number] Date of Birth: [birth year] Today's Date: 11/25/2022 HPI/PMH: HPI: Yvonne Bailey is an 82 yo female presenting to ED 9/14 with AMS after a ground level fall. Found to have UTI with subsequent delirium and encephalopathy. CT Chest Abdomen Pelvis with moderate to large R and small L pleural effusions with associated atelectasis as well as moderate patchy bilateral apical ground-glass opacities which may be representative of PNA. CXR pending. Pt seen most recently by SLP for cognition 06/2022, but prior to that was seen for swallowing in 2018 without s/s of dysphagia or aspiration. 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 Oral phase inconsistently prolonged and swallow initiation inconsistently initiated at the level of the pyriform sinuses, suspected secondary to decreased cognition, but with full airway protection across consistencies. Appearance of a possible osteophyte at C5-6 causes a minimal amount of residue in esophagus post swallow but did not overall impact function. Will initiate a diet and f/u briefly for tolerance. Factors that may increase risk of adverse event in presence of aspiration Rubye Oaks & Clearance Coots 2021): Factors that may increase risk of adverse event in presence of aspiration Rubye Oaks & Clearance Coots 2021): Reduced cognitive function Recommendations/Plan: Swallowing  Evaluation Recommendations Swallowing Evaluation Recommendations Recommendations: PO diet PO Diet Recommendation: Dysphagia 2 (Finely chopped); Thin liquids (Level 0)  Liquid Administration via: Cup; Straw Medication Administration: Whole meds with puree (crush if needed) Supervision: Staff to assist with self-feeding; Full supervision/cueing for swallowing strategies Swallowing strategies  : Slow rate; Small bites/sips Postural changes: Position pt fully upright for meals Oral care recommendations: Oral care BID (2x/day) Treatment Plan Treatment Plan Treatment recommendations: Therapy as outlined in treatment plan below Functional status assessment: Patient has had a recent decline in their functional status and demonstrates the ability to make significant improvements in function in a reasonable and predictable amount of time. Treatment frequency: Min 1x/week Treatment duration: 1 week Interventions: Aspiration precaution training; Patient/family education; Compensatory techniques; Diet toleration management by SLP Recommendations Recommendations for follow up therapy are one component of a multi-disciplinary discharge planning process, led by the attending physician.  Recommendations may be updated based on patient status, additional functional criteria and insurance authorization. Assessment: Orofacial Exam: Orofacial Exam Oral Cavity: Oral Hygiene: Lingual coating Oral Cavity - Dentition: Missing dentition; Poor condition Orofacial Anatomy: WFL Oral Motor/Sensory Function: WFL Anatomy: Anatomy: -- (appearance of osteophytes at C5-6) Boluses Administered: Boluses Administered Boluses Administered: Thin liquids (Level 0); Mildly thick liquids (Level 2, nectar thick); Puree; Solid  Oral Impairment Domain: Oral Impairment Domain Lip Closure: No labial escape Tongue control during bolus hold: Cohesive bolus between tongue to palatal seal Bolus preparation/mastication: Slow prolonged chewing/mashing with complete  recollection Bolus transport/lingual motion: Delayed initiation of tongue motion (oral holding) Oral residue: Trace residue lining oral structures Location of oral residue : Tongue Initiation of pharyngeal swallow : Posterior laryngeal surface of the epiglottis  Pharyngeal Impairment Domain: Pharyngeal Impairment Domain Soft palate elevation: No bolus between soft palate (SP)/pharyngeal wall (PW) Laryngeal elevation: Complete superior movement of thyroid cartilage with complete approximation of arytenoids to epiglottic petiole Anterior hyoid excursion: Complete anterior movement Epiglottic movement: Complete inversion Laryngeal vestibule closure: Complete, no air/contrast in laryngeal vestibule Pharyngeal stripping wave : Present - complete Pharyngeal contraction (A/P view only): N/A Pharyngoesophageal segment opening: Complete distension and complete duration, no obstruction of flow Tongue base retraction: No contrast between tongue base and posterior pharyngeal wall (PPW) Pharyngeal residue: Complete pharyngeal clearance  Esophageal Impairment Domain: Esophageal Impairment Domain Esophageal clearance upright position: Esophageal retention (see impression statement) Pill: Pill Consistency administered: Thin liquids (Level 0); Puree Thin liquids (Level 0): Impaired (see clinical impressions) Puree: WFL Penetration/Aspiration Scale Score: Penetration/Aspiration Scale Score 1.  Material does not enter airway: Thin liquids (Level 0); Mildly thick liquids (Level 2, nectar thick); Puree; Solid; Pill Compensatory Strategies: Compensatory Strategies Compensatory strategies: No   General Information: Caregiver present: No  Diet Prior to this Study: NPO   Temperature : Normal   Respiratory Status: WFL   Supplemental O2: None (Room air)   History of Recent Intubation: No  Behavior/Cognition: Alert; Cooperative; Pleasant mood; Confused Self-Feeding Abilities: Able to self-feed Baseline vocal quality/speech: Normal No data  recorded Volitional Swallow: Unable to elicit No data recorded Goal Planning: Prognosis for improved oropharyngeal function: Good Barriers to Reach Goals: Cognitive deficits No data recorded Patient/Family Stated Goal: none stated No data recorded Pain: Pain Assessment Pain Assessment: No/denies pain Pain Score: 4 Faces Pain Scale: 4 Pain Location: Bil lower legs (R>L) (reproduced with movement, weight bearing, and pressure at mid-calf and deep palpation of calf muscles on R, did not note any radiographs or redness and warmth - notified RN) Pain Descriptors / Indicators: Discomfort; Grimacing; Guarding; Sore Pain Intervention(s): Limited activity within patient's tolerance; Monitored during session; Repositioned End of Session: Start Time:SLP Start Time (ACUTE  ONLY): 1230 Stop Time: SLP Stop Time (ACUTE ONLY): 1250 Time Calculation:SLP Time Calculation (min) (ACUTE ONLY): 20 min Charges: SLP Evaluations $ SLP Speech Visit: 1 Visit SLP Evaluations $BSS Swallow: 1 Procedure $MBS Swallow: 1 Procedure SLP visit diagnosis: SLP Visit Diagnosis: Dysphagia, oropharyngeal phase (R13.12) Past Medical History: Past Medical History: Diagnosis Date  Bilateral leg edema 07/19/2015  CAD (coronary artery disease)   a. anterior MI s/p PCI in 1992. b. cath 08/2015 with severe three-vessel CAD turned down for CABG and underwent DESx5 to prox Cx/OM2/D1/oRCA/mRCA  Carotid artery disease (HCC)   a. carotid duplex 03/2015 showed 1-39% BICA, normal subclavian arteries, chronically occluded left vertebral, f/u recommended only PRN.  DVT (deep venous thrombosis) (HCC)   X1  History of nuclear stress test   a. Myoview 6/17: EF 20-25%, mid anteroseptal, apical anterior, apical septal, apical inferior, apical lateral and apical scar, no ischemia, intermediate risk  HTN (hypertension)   Hyperlipidemia   Hypokalemia   Hypothyroidism   Ischemic cardiomyopathy   a. Echo 6/17: EF 20-25%, apex appears akinetic, MAC, moderate MR, moderate LAE, mild RVE,  trivial PI, PASP 47 mmHg (needs repeat with Definity contrast).  b. Limited echo with Definity contrast 7/17: EF 25-30%, moderate to severe LAE. c. Limited Echo 2018 showed EF 40-45%.  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  Dr Terrace Arabia, Neurology   Leukocytosis   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  Longstanding persistent atrial fibrillation (HCC)   MI (myocardial infarction) (HCC) 1992  PAD (peripheral artery disease) (HCC)   Right SFA occlusion, severe disease left CFA and SFA  Prediabetes 07/28/2016  Stage 3 chronic kidney disease (HCC)   Stroke (HCC)   a. 02/2017 in setting of noncompliance with Eliquis  TIA (transient ischemic attack)   Tricuspid regurgitation  Past Surgical History: Past Surgical History: Procedure Laterality Date  APPENDECTOMY    at hysterectomy and USO for fibroids, Dr. Kizzie Bane  CARDIAC CATHETERIZATION  1992  Dr Charlies Constable  CARDIAC CATHETERIZATION N/A 09/06/2015  Procedure: Right/Left Heart Cath and Coronary Angiography;  Surgeon: Kathleene Hazel, MD;  Location: [facility] INVASIVE CV LAB;  Service: Cardiovascular;  Laterality: N/A;  CARDIAC CATHETERIZATION N/A 09/07/2015  Procedure: Coronary Stent Intervention;  Surgeon: Kathleene Hazel, MD;  Location: [facility] INVASIVE CV LAB;  Service: Cardiovascular;  Laterality: N/A;  COLONOSCOPY    negative; 2008, Dr. Lina Sar  fracture LLE    '94; pinned  IR ANGIO INTRA EXTRACRAN SEL COM CAROTID INNOMINATE BILAT MOD SED  03/02/2017  IR ANGIO INTRA EXTRACRAN SEL COM CAROTID INNOMINATE BILAT MOD SED  04/23/2018  IR ANGIO VERTEBRAL SEL SUBCLAVIAN INNOMINATE BILAT MOD SED  04/23/2018  IR ANGIO VERTEBRAL SEL VERTEBRAL UNI R MOD SED  03/02/2017  IR RADIOLOGIST EVAL & MGMT  04/28/2017  IR US GUIDE VASC ACCESS RIGHT  04/23/2018  RADIOLOGY WITH ANESTHESIA N/A 03/02/2017  Procedure: RADIOLOGY WITH ANESTHESIA;  Surgeon: Julieanne Cotton, MD;  Location: [facility] OR;  Service: Radiology;  Laterality: N/A;  TEE WITHOUT CARDIOVERSION N/A 11/02/2020  Procedure: TRANSESOPHAGEAL  ECHOCARDIOGRAM (TEE);  Surgeon: Thurmon Fair, MD;  Location: [facility] ENDOSCOPY;  Service: Cardiovascular;  Laterality: N/A;  TONSILLECTOMY    TOTAL ABDOMINAL HYSTERECTOMY    & BSO for fibroids Ferdinand Lango MA, CCC-SLP [place] Leah Meryl 11/25/2022, 1:54 PM  DG Tibia/Fibula Right  Result Date: 11/24/2022 CLINICAL DATA:  Right leg pain, no known injury, initial encounter EXAM: RIGHT TIBIA AND FIBULA - 2 VIEW COMPARISON:  None Available. FINDINGS: Tarsal degenerative changes are noted. No acute fracture or dislocation  is seen. Mild degenerative changes of the knee joint are noted. No soft tissue changes are noted. IMPRESSION: Degenerative change without acute abnormality. Electronically Signed   By: Alcide Clever M.D.   On: 11/24/2022 19:38   DG CHEST PORT 1 VIEW  Result Date: 11/24/2022 CLINICAL DATA:  Short of breath EXAM: PORTABLE CHEST 1 VIEW COMPARISON:  06/29/2022 FINDINGS: Single frontal view of the chest demonstrates an enlarged cardiac silhouette. There is increased central vascular congestion, with diffuse increased interstitial prominence compatible with interstitial edema. Bibasilar veiling opacities consistent with consolidation and underlying effusions. No pneumothorax. No acute bony abnormalities. IMPRESSION: 1. Constellation of findings most consistent with congestive heart failure. Electronically Signed   By: Sharlet Salina M.D.   On: 11/24/2022 15:20   DG FEMUR PORT, MIN 2 VIEWS RIGHT  Result Date: 11/23/2022 CLINICAL DATA:  Recent fall with right thigh pain, initial encounter EXAM: RIGHT FEMUR PORTABLE 2 VIEW COMPARISON:  None Available. FINDINGS: Degenerative changes of the right hip joint are noted. No acute fracture or dislocation is seen. No soft tissue changes are noted. IMPRESSION: No acute abnormality noted. Electronically Signed   By: Alcide Clever M.D.   On: 11/23/2022 21:50   DG FEMUR MIN 2 VIEWS LEFT  Result Date: 11/23/2022 CLINICAL DATA:  Recent fall with left thigh pain, initial  encounter EXAM: LEFT FEMUR 2 VIEWS COMPARISON:  None Available. FINDINGS: Degenerative changes in the left hip joint are noted. No acute fracture or dislocation is seen. No soft tissue abnormality is noted. IMPRESSION: No acute abnormality seen. Electronically Signed   By: Alcide Clever M.D.   On: 11/23/2022 21:49   CT CHEST ABDOMEN PELVIS WO CONTRAST  Result Date: 11/23/2022 CLINICAL DATA:  Fall with chest and abdomen pain. EXAM: CT CHEST, ABDOMEN AND PELVIS WITHOUT CONTRAST TECHNIQUE: Multidetector CT imaging of the chest, abdomen and pelvis was performed following the standard protocol without IV contrast. RADIATION DOSE REDUCTION: This exam was performed according to the departmental dose-optimization program which includes automated exposure control, adjustment of the mA and/or kV according to patient size and/or use of iterative reconstruction technique. COMPARISON:  Chest radiograph dated 07/07/2022, CT abdomen and pelvis dated 02/13/2021. FINDINGS: CT CHEST FINDINGS Cardiovascular: Vascular calcifications are seen in the coronary arteries and aortic arch. The heart is enlarged. No pericardial effusion. Mediastinum/Nodes: No enlarged mediastinal, hilar, or axillary lymph nodes. Thyroid gland, trachea, and esophagus demonstrate no significant findings. Lungs/Pleura: There are moderate to large right and small left pleural effusions with associated atelectasis. There are moderate patchy bilateral apical ground-glass opacities. No pneumothorax. Musculoskeletal: Degenerative changes are seen in the spine. CT ABDOMEN PELVIS FINDINGS Hepatobiliary: No focal liver abnormality is seen. No gallstones, gallbladder wall thickening, or biliary dilatation. Pancreas: Unremarkable. No pancreatic ductal dilatation or surrounding inflammatory changes. Spleen: Normal in size without focal abnormality. Adrenals/Urinary Tract: The left adrenal gland demonstrates mild thickening, unchanged from prior exam. Three calcifications  appear to be located in the upper right ureter, measuring up to 4 mm in size, and appear similar to prior exam (series 8 images 84-85). There is a possible distal right ureteral calculus/density (series 5, image 111) which measures 3 mm and was not definitely seen on prior exam. Multiple nonobstructive right renal calculus measure up to 11 x 4 mm in size. Bilateral renal cysts appear unchanged. No calculi on the left. No hydronephrosis on either side. The bladder is unremarkable. Stomach/Bowel: Stomach is within normal limits. There is mild distention of the rectum with stool and mild  circumferential wall thickening. There is colonic diverticulosis without evidence of diverticulitis. No evidence of bowel wall thickening, distention, or inflammatory changes. Vascular/Lymphatic: Aortic atherosclerosis. No enlarged abdominal or pelvic lymph nodes. Reproductive: Status post hysterectomy. No adnexal masses. Other: No abdominal wall hernia or abnormality. No abdominopelvic ascites. Musculoskeletal: Degenerative changes are seen in the spine. IMPRESSION: 1. No acute traumatic injury in the chest, abdomen, or pelvis. 2. Moderate to large right and small left pleural effusions with associated atelectasis. Moderate patchy bilateral apical ground-glass opacities may reflect pneumonia. 3. Three calcifications in the right ureter appear similar to prior exam. One possible distal right ureteral calculus/density was not definitely seen on prior exam. No hydronephrosis on either side. 4. Mild distention of the rectum with stool and mild circumferential wall thickening may reflect stercoral colitis. Aortic Atherosclerosis (ICD10-I70.0). Electronically Signed   By: Romona Curls M.D.   On: 11/23/2022 18:01   CT Head Wo Contrast  Result Date: 11/23/2022 CLINICAL DATA:  Fall, head trauma EXAM: CT HEAD WITHOUT CONTRAST CT CERVICAL SPINE WITHOUT CONTRAST TECHNIQUE: Multidetector CT imaging of the head and cervical spine was performed  following the standard protocol without intravenous contrast. Multiplanar CT image reconstructions of the cervical spine were also generated. RADIATION DOSE REDUCTION: This exam was performed according to the departmental dose-optimization program which includes automated exposure control, adjustment of the mA and/or kV according to patient size and/or use of iterative reconstruction technique. COMPARISON:  CT head dated 07/26/2022 FINDINGS: CT HEAD FINDINGS Brain: No evidence of acute infarction, hemorrhage, hydrocephalus, extra-axial collection or mass lesion/mass effect. 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 Subcortical white matter and periventricular small vessel ischemic changes. Vascular: Intracranial atherosclerosis. Skull: Normal. Negative for fracture or focal lesion. Sinuses/Orbits: The visualized paranasal sinuses are essentially clear. The mastoid air cells are unopacified. Other: None. CT CERVICAL SPINE FINDINGS Alignment: Normal cervical lordosis. Skull base and vertebrae: No acute fracture. No primary bone lesion or focal pathologic process. Soft tissues and spinal canal: No prevertebral fluid or swelling. No visible canal hematoma. Disc levels: Mild degenerative changes of the mid cervical spine. Spinal canal is patent. Upper chest: Evaluated on dedicated CT chest. Other: None. IMPRESSION: No acute intracranial abnormality. 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 Small vessel ischemic changes. No traumatic injury to the cervical spine. Mild degenerative changes. Electronically Signed   By: Charline Bills M.D.   On: 11/23/2022 17:44   CT Cervical Spine Wo Contrast  Result Date: 11/23/2022 CLINICAL DATA:  Fall, head trauma EXAM: CT HEAD WITHOUT CONTRAST CT CERVICAL SPINE WITHOUT CONTRAST TECHNIQUE: Multidetector CT imaging of the head and cervical spine was performed following the standard protocol without intravenous contrast.  Multiplanar CT image reconstructions of the cervical spine were also generated. RADIATION DOSE REDUCTION: This exam was performed according to the departmental dose-optimization program which includes automated exposure control, adjustment of the mA and/or kV according to patient size and/or use of iterative reconstruction technique. COMPARISON:  CT head dated 07/26/2022 FINDINGS: CT HEAD FINDINGS Brain: No evidence of acute infarction, hemorrhage, hydrocephalus, extra-axial collection or mass lesion/mass effect. 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 Subcortical white matter and periventricular small vessel ischemic changes. Vascular: Intracranial atherosclerosis. Skull: Normal. Negative for fracture or focal lesion. Sinuses/Orbits: The visualized paranasal sinuses are essentially clear. The mastoid air cells are unopacified. Other: None. CT CERVICAL SPINE FINDINGS Alignment: Normal cervical lordosis. Skull base and vertebrae: No acute fracture. No primary bone lesion or  focal pathologic process. Soft tissues and spinal canal: No prevertebral fluid or swelling. No visible canal hematoma. Disc levels: Mild degenerative changes of the mid cervical spine. Spinal canal is patent. Upper chest: Evaluated on dedicated CT chest. Other: None. IMPRESSION: No acute intracranial abnormality. 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 Small vessel ischemic changes. No traumatic injury to the cervical spine. Mild degenerative changes. Electronically Signed   By: Charline Bills M.D.   On: 11/23/2022 17:44        Scheduled Meds:  apixaban  5 mg Oral BID   aspirin EC  81 mg Oral Daily   atorvastatin  80 mg Oral Daily   dorzolamide-timolol  1 drop Both Eyes TID   fentaNYL (SUBLIMAZE) injection  25 mcg Intravenous Once   furosemide  20 mg Oral Daily   levothyroxine  100 mcg Oral Q0600   losartan  25 mg Oral Daily   melatonin  3 mg Oral QHS   mirabegron ER  25 mg  Oral Daily   Netarsudil Dimesylate  1 drop Both Eyes QPM   senna-docusate  2 tablet Oral BID   sertraline  50 mg Oral QHS   Continuous Infusions:  ceFEPime (MAXIPIME) IV 2 g (11/25/22 0848)     LOS: 1 day    Time spent: 52 minutes spent on chart review, discussion with nursing staff, consultants, updating family and interview/physical exam; more than 50% of that time was spent in counseling and/or coordination of care.    Alvira Philips [place], DO [facility] Available via Epic secure chat 7am-7pm After these hours, please refer to coverage provider listed on [email or web address] 11/25/2022, 1:58 PM

## 2022-11-25 NOTE — Plan of Care (Signed)
Problem: Pain Managment: Goal: General experience of comfort will improve Outcome: Completed/Met

## 2022-11-25 NOTE — TOC Initial Note (Addendum)
Transition of Care [facility]) - Initial/Assessment Note    Patient Details  Name: Yvonne Bailey MRN: [id number] Date of Birth: 17-Oct-1940  Transition of [facility]) CM/SW Contact:    Lorri Frederick, LCSW Phone Number: 11/25/2022, 3:40 PM  Clinical Narrative:     Pt oriented x2, unable to meaningfully participate in conversation.  1300: CSW attempted to reach pt son [name].  No answer, unable to leave message.  CSW was able to reach granddaughter Leavy Cella, who also tried to reach her father, [name], unsuccessfully.  Discussed PT recommendation for SNF.  Jasmine reports her father is aware and is in agreement with SNF, pt has been to [facility] and [facility] in the past.  Pt lives at home with [name], does have HH aide in place, unsure how many hours.  Permission given to send out referral in hub.  Referral sent out in hub.  Reached out to [facility] and [facility] to review.            Expected Discharge Plan: Skilled Nursing Facility Barriers to Discharge: Continued Medical Work up, SNF Pending bed offer   Patient Goals and CMS Choice            Expected Discharge Plan and Services In-house Referral: Clinical Social Work   Post Acute Care Choice: Skilled Nursing Facility Living arrangements for the past 2 months: Single Family Home                                      Prior Living Arrangements/Services Living arrangements for the past 2 months: Single Family Home Lives with:: Adult Children (with son [name])          Need for Family Participation in Patient Care: Yes (Comment) Care giver support system in place?: Yes (comment) Current home services: Homehealth aide (unsure how many hours) Criminal Activity/Legal Involvement Pertinent to Current Situation/Hospitalization: No - Comment as needed  Activities of Daily Living Home Assistive Devices/Equipment: Environmental consultant (specify type) ADL Screening (condition at time of admission) Patient's cognitive ability adequate to  safely complete daily activities?: No Is the patient deaf or have difficulty hearing?: No Does the patient have difficulty seeing, even when wearing glasses/contacts?: Yes Does the patient have difficulty concentrating, remembering, or making decisions?: Yes Patient able to express need for assistance with ADLs?: Yes Does the patient have difficulty dressing or bathing?: Yes Independently performs ADLs?: No Communication: Independent Dressing (OT): Needs assistance Is this a change from baseline?: Pre-admission baseline Grooming: Needs assistance Is this a change from baseline?: Pre-admission baseline Feeding: Independent Bathing: Needs assistance Is this a change from baseline?: Pre-admission baseline Toileting: Needs assistance Is this a change from baseline?: Pre-admission baseline In/Out Bed: Needs assistance Is this a change from baseline?: Pre-admission baseline Walks in Home: Independent with device (comment) Does the patient have difficulty walking or climbing stairs?: Yes Weakness of Legs: Both Weakness of Arms/Hands: Both  Permission Sought/Granted                  Emotional Assessment Appearance:: Appears stated age Attitude/Demeanor/Rapport: Engaged Affect (typically observed): Happy Orientation: : Oriented to Self, Oriented to  Time      Admission diagnosis:  Delirium [R41.0] Pleural effusion [J90] Complicated UTI (urinary tract infection) [N39.0] Pneumonia of both lower lobes due to infectious organism [J18.9] 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] Community acquired pneumonia [J18.9] Patient Active Problem List   Diagnosis Date Noted   Community  acquired pneumonia 11/24/2022   Complicated UTI (urinary tract infection) 11/23/2022   Recurrent episodes of unresponsiveness 07/02/2022   Altered mental status 06/30/2022   TIA (transient ischemic attack) 06/29/2022   Moderate dementia (HCC) 04/30/2022   UTI (urinary tract infection) 04/23/2022   Fall 03/17/2022    Hypomagnesemia 11/19/2021   Hypokalemia 11/17/2021   Catheter-associated urinary tract infection (HCC) 09/03/2021   Acute metabolic encephalopathy 09/03/2021   Acute cystitis with hematuria 10/17/2020   Rash 08/29/2020   Anxiety and depression 03/28/2020   Overactive bladder 03/28/2020   Lack of motivation 12/21/2019   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   Aphasia as late effect of cerebrovascular accident (CVA)    Acute ischemic cerebrovascular accident (CVA) involving left middle cerebral artery territory [facility])    Coronary artery disease involving native coronary artery without angina pectoris    Stage 3 chronic kidney disease (HCC)    Prediabetes 07/28/2016   DOE (dyspnea on exertion) 07/09/2016   Gait instability 07/09/2016   Cardiomyopathy, ischemic    Chronic systolic heart failure (HCC) 08/21/2015   Bilateral leg edema 07/19/2015   Atrial fibrillation (HCC) 07/19/2015   Urinary urgency 03/30/2015   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   Small vessel disease, cerebrovascular 12/17/2012   CAROTID BRUIT, RIGHT 08/10/2008   Peripheral vascular disease (HCC) 06/04/2007   Diverticulosis of large intestine 06/04/2007   Hypothyroidism 02/24/2007   Dyslipidemia 02/24/2007   Essential hypertension 02/24/2007   Acute thromboembolism of deep veins of lower extremity (HCC) 01/21/2007   PCP:  Pincus Sanes, MD Pharmacy:   [name] [place], [place] - [address] AT [facility] OF [address] [place] [address] Phone: [phone] Fax: [phone]  [facility] - [place], [place] - [address] [place] [address] Phone: [phone] Fax: [phone]  [id number] - [place],  - [address] AT [facility] OF [address] [place] [address] Phone: [phone] Fax: [phone]     Social Determinants of Health (SDOH) Social  History: SDOH Screenings   Food Insecurity: No Food Insecurity (11/24/2022)  Housing: Low Risk  (11/24/2022)  Transportation Needs: No Transportation Needs (11/24/2022)  Utilities: Patient Unable To Answer (11/24/2022)  Alcohol Screen: Low Risk  (12/17/2021)  Depression (PHQ2-9): Low Risk  (03/18/2022)  Financial Resource Strain: Low Risk  (12/17/2021)  Physical Activity: Inactive (12/17/2021)  Social Connections: Patient Declined (12/17/2021)  Stress: No Stress Concern Present (12/17/2021)  Tobacco Use: Medium Risk (11/23/2022)   SDOH Interventions:     Readmission Risk Interventions    11/19/2021   10:32 AM  Readmission Risk Prevention Plan  Transportation Screening Complete  PCP or Specialist Appt within 5-7 Days Complete  Home Care Screening Complete  Medication Review (RN CM) Complete

## 2022-11-25 NOTE — [place] FL2 (Signed)
[place] MEDICAID FL2 LEVEL OF CARE FORM     IDENTIFICATION  Patient Name: Yvonne Bailey Birthdate: 1940/05/31 Sex: female Admission Date (Current Location): 11/23/2022  [facility] Facility and [place] Number:  Producer, television/film/video and Address:  [facility], [address] [place], [place] [address]      Provider Number: [id number]  Attending Physician Name and Address:  [place], Eric J, DO  Relative Name and Phone Number:  [name]   [phone]    Current Level of Care: Hospital Recommended Level of Care: Skilled Nursing Facility Prior Approval Number:    Date Approved/Denied:   PASRR Number: [id number]  Discharge Plan: SNF    Current Diagnoses: Patient Active Problem List   Diagnosis Date Noted   Community acquired pneumonia 11/24/2022   Complicated UTI (urinary tract infection) 11/23/2022   Recurrent episodes of unresponsiveness 07/02/2022   Altered mental status 06/30/2022   TIA (transient ischemic attack) 06/29/2022   Moderate dementia (HCC) 04/30/2022   UTI (urinary tract infection) 04/23/2022   Fall 03/17/2022   Hypomagnesemia 11/19/2021   Hypokalemia 11/17/2021   Catheter-associated urinary tract infection (HCC) 09/03/2021   Acute metabolic encephalopathy 09/03/2021   Acute cystitis with hematuria 10/17/2020   Rash 08/29/2020   Anxiety and depression 03/28/2020   Overactive bladder 03/28/2020   Lack of motivation 12/21/2019   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   Aphasia as late effect of cerebrovascular accident (CVA)    Acute ischemic cerebrovascular accident (CVA) involving left middle cerebral artery territory [facility])    Coronary artery disease involving native coronary artery without angina pectoris    Stage 3 chronic kidney disease (HCC)    Prediabetes 07/28/2016   DOE (dyspnea on exertion) 07/09/2016   Gait instability 07/09/2016   Cardiomyopathy, ischemic    Chronic systolic heart failure (HCC) 08/21/2015    Bilateral leg edema 07/19/2015   Atrial fibrillation (HCC) 07/19/2015   Urinary urgency 03/30/2015   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   Small vessel disease, cerebrovascular 12/17/2012   CAROTID BRUIT, RIGHT 08/10/2008   Peripheral vascular disease (HCC) 06/04/2007   Diverticulosis of large intestine 06/04/2007   Hypothyroidism 02/24/2007   Dyslipidemia 02/24/2007   Essential hypertension 02/24/2007   Acute thromboembolism of deep veins of lower extremity (HCC) 01/21/2007    Orientation RESPIRATION BLADDER Height & Weight     Self, Time  Normal Incontinent, External catheter Weight: 176 lb 2.4 oz (79.9 kg) Height:  5\' 7"  (170.2 cm)  BEHAVIORAL SYMPTOMS/MOOD NEUROLOGICAL BOWEL NUTRITION STATUS      Continent Diet (see discharge summary)  AMBULATORY STATUS COMMUNICATION OF NEEDS Skin   Total Care Verbally Normal                       Personal Care Assistance Level of Assistance  Bathing, Feeding, Dressing Bathing Assistance: Maximum assistance Feeding assistance: Limited assistance Dressing Assistance: Maximum assistance     Functional Limitations Info  Sight, Hearing, Speech Sight Info: Adequate Hearing Info: Impaired Speech Info: Adequate    SPECIAL CARE FACTORS FREQUENCY  PT (By licensed PT), OT (By licensed OT)     PT Frequency: 5x week OT Frequency: 5x week            Contractures Contractures Info: Not present    Additional Factors Info  Code Status, Allergies Code Status Info: full Allergies Info: Definity (Perflutren Lipid Microsphere), Pletal (Cilostazol)           Current Medications (11/25/2022):  This is  the current hospital active medication list Current Facility-Administered Medications  Medication Dose Route Frequency Provider Last Rate Last Admin   acetaminophen (TYLENOL) tablet 1,000 mg  1,000 mg Oral Q6H PRN Dolly Rias, MD   1,000 mg at 11/25/22 1010   apixaban (ELIQUIS) tablet 5 mg  5 mg Oral BID Dolly Rias, MD   5 mg  at 11/25/22 1010   aspirin EC tablet 81 mg  81 mg Oral Daily Segars, Christiane Ha, MD   81 mg at 11/25/22 1010   atorvastatin (LIPITOR) tablet 80 mg  80 mg Oral Daily Dolly Rias, MD   80 mg at 11/25/22 1010   bisacodyl (DULCOLAX) suppository 10 mg  10 mg Rectal Daily PRN [place], Eric J, DO       ceFEPIme (MAXIPIME) 2 g in sodium chloride 0.9 % 100 mL IVPB  2 g Intravenous Q12H [place], Eric J, DO 200 mL/hr at 11/25/22 0848 2 g at 11/25/22 0848   dorzolamide-timolol (COSOPT) 2-0.5 % ophthalmic solution 1 drop  1 drop Both Eyes TID Dolly Rias, MD   1 drop at 11/25/22 1010   fentaNYL (SUBLIMAZE) injection 25 mcg  25 mcg Intravenous Once Gloris Manchester, MD       furosemide (LASIX) tablet 20 mg  20 mg Oral Daily [place], Alvira Philips, DO   20 mg at 11/25/22 1010   hydrALAZINE (APRESOLINE) tablet 25 mg  25 mg Oral Q8H PRN [place], Eric J, DO   25 mg at 11/24/22 1627   levothyroxine (SYNTHROID) tablet 100 mcg  100 mcg Oral Q0600 Dolly Rias, MD   100 mcg at 11/25/22 0525   losartan (COZAAR) tablet 25 mg  25 mg Oral Daily [place], Eric J, DO   25 mg at 11/25/22 1010   melatonin tablet 3 mg  3 mg Oral QHS [place], Eric J, DO       mirabegron ER (MYRBETRIQ) tablet 25 mg  25 mg Oral Daily Segars, Christiane Ha, MD   25 mg at 11/25/22 1010   Netarsudil Dimesylate 0.02 % SOLN 1 drop  1 drop Both Eyes QPM Segars, Jonathan, MD       senna-docusate (Senokot-S) tablet 2 tablet  2 tablet Oral BID [place], Eric J, DO   2 tablet at 11/25/22 1010   sertraline (ZOLOFT) tablet 50 mg  50 mg Oral QHS Dolly Rias, MD   50 mg at 11/24/22 2112     Discharge Medications: Please see discharge summary for a list of discharge medications.  Relevant Imaging Results:  Relevant Lab Results:   Additional Information SSN: [id number]  Lorri Frederick, LCSW

## 2022-11-25 NOTE — Progress Notes (Signed)
  Echocardiogram 2D Echocardiogram has been performed.  Maren Reamer 11/25/2022, 3:08 PM

## 2022-11-26 DIAGNOSIS — N390 Urinary tract infection, site not specified: Secondary | ICD-10-CM | POA: Diagnosis not present

## 2022-11-26 MED ORDER — METOPROLOL TARTRATE 12.5 MG HALF TABLET
12.5000 mg | ORAL_TABLET | Freq: Two times a day (BID) | ORAL | Status: DC
  Administered 2022-11-26 – 2022-11-27 (×3): 12.5 mg via ORAL
  Filled 2022-11-26 (×3): qty 1

## 2022-11-26 NOTE — Plan of Care (Signed)
  Problem: Ischemic Stroke/TIA Tissue Perfusion: Goal: Complications of ischemic stroke/TIA will be minimized Outcome: Progressing   Problem: Health Behavior/Discharge Planning: Goal: Goals will be collaboratively established with patient/family Outcome: Progressing   Problem: Nutrition: Goal: Risk of aspiration will decrease Outcome: Progressing Goal: Dietary intake will improve Outcome: Progressing   Problem: Cardiac: Goal: Ability to achieve and maintain adequate cardiopulmonary perfusion will improve Outcome: Progressing   Problem: Clinical Measurements: Goal: Ability to maintain clinical measurements within normal limits will improve Outcome: Progressing Goal: Will remain free from infection Outcome: Progressing Goal: Diagnostic test results will improve Outcome: Progressing Goal: Respiratory complications will improve Outcome: Progressing   Problem: Activity: Goal: Risk for activity intolerance will decrease Outcome: Progressing   Problem: Nutrition: Goal: Adequate nutrition will be maintained Outcome: Progressing   Problem: Coping: Goal: Level of anxiety will decrease Outcome: Progressing   Problem: Elimination: Goal: Will not experience complications related to bowel motility Outcome: Progressing Goal: Will not experience complications related to urinary retention Outcome: Progressing   Problem: Safety: Goal: Ability to remain free from injury will improve Outcome: Progressing   Problem: Skin Integrity: Goal: Risk for impaired skin integrity will decrease Outcome: Progressing

## 2022-11-26 NOTE — Progress Notes (Addendum)
PT Cancellation Note  Patient Details Name: RASHIMA HOLSTINE MRN: [id number] DOB: [birth year]   Cancelled Treatment:    Reason Eval/Treat Not Completed: (P) Other (comment) (defer PT session until after RLE doppler which is scheduled for today.) Will continue efforts per PT plan of care as schedule permits later in the day post-doppler. 4:52PM update: Pt still pending RLE ultrasound and per RN with RLE edema, will defer PT tx session until after doppler results in system next date.    Dorathy Kinsman Eryx Zane 11/26/2022, 12:33 PM

## 2022-11-26 NOTE — Progress Notes (Signed)
This encounter was created in error - please disregard. I called patient and her son answered.  He informed me that patient is currently in the hospital-since Sunday.  No other details.  He stated he will call office to reschedule visit.

## 2022-11-26 NOTE — Progress Notes (Signed)
Speech Language Pathology Treatment: Dysphagia  Patient Details Name: Yvonne Bailey MRN: [id number] DOB: [birth year] Today's Date: 11/26/2022 Time: 0900-0920 SLP Time Calculation (min) (ACUTE ONLY): 20 min  Assessment / Plan / Recommendation Clinical Impression  Skilled observation complete with am meal. Patient required moderate physical and verbal cueing for sustained attention to task, appropriate use of utensils for self feeding. Overall however, she was able to self feed with assist current diet (dysphagia 2 with thin liquids) without overt s/s of aspiration. Dysphagia 2 solids continue to remain appropriate given lack of dentition and decreased sustained attention to task. No further acute f/u indicated. May be beneficial to have brief f/u at SNF to establish diet and determine any potential for advancement of solids.    HPI HPI: MERIDETH LUPTAK is an 82 yo female presenting to ED 9/14 with AMS after a ground level fall. Found to have UTI with subsequent delirium and encephalopathy. CT Chest Abdomen Pelvis with moderate to large R and small L pleural effusions with associated atelectasis as well as moderate patchy bilateral apical ground-glass opacities which may be representative of PNA. CXR pending. Pt seen most recently by SLP for cognition 06/2022, but prior to that was seen for swallowing in 2018 without s/s of dysphagia or aspiration. PMH includes dementia, CVA, CAD, HfrEF, A-fib, PE on AC, CKD III, hypothyroidism, HTN, HLD, pre DM      SLP Plan  All goals met;Discharge SLP treatment due to (comment)      Recommendations for follow up therapy are one component of a multi-disciplinary discharge planning process, led by the attending physician.  Recommendations may be updated based on patient status, additional functional criteria and insurance authorization.    Recommendations  Diet recommendations: Dysphagia 2 (fine chop);Thin liquid Liquids provided via:  Cup;Straw Medication Administration: Crushed with puree Supervision: Staff to assist with self feeding;Full supervision/cueing for compensatory strategies Compensations: Slow rate;Small sips/bites;Minimize environmental distractions Postural Changes and/or Swallow Maneuvers: Seated upright 90 degrees                 Oral care BID     Dysphagia, oropharyngeal phase (R13.12)     All goals met;Discharge SLP treatment due to (comment)    Ferdinand Lango MA, CCC-SLP  Hayle Parisi Meryl  11/26/2022, 9:28 AM

## 2022-11-26 NOTE — TOC Progression Note (Addendum)
Transition of Care [facility] - Progression Note    Patient Details  Name: Yvonne Bailey MRN: [id number] Date of Birth: 03-06-1941  [facility] CM/SW Contact  Delilah Shan, LCSWA Phone Number: 11/26/2022, 2:02 PM  Clinical Narrative:     Due to patients current orientation CSW tried to call patients son to discuss SNF bed offers. CSW unable to leave voicemail. CSW called Patients Granddaughter Leavy Cella and provided SNF bed offers. Jasmine request to give CSW a call back she is going to discuss with her dad/patients son and give CSW call back with SNF choice. CSW will continue to follow.  CSW started insurance authorization for patient. Auth ID# [id number]. CSW will add facility choice when provided by patients son/granddaughter.   CSW received call back from [facility] patients Granddaughter who accepted SNF bed offer with [facility] for patient. Brittney with [facility] confirmed bed for patient. CSW added facility choice to patients insurance authorization.   Expected Discharge Plan: Skilled Nursing Facility Barriers to Discharge: Continued Medical Work up, SNF Pending bed offer  Expected Discharge Plan and Services In-house Referral: Clinical Social Work   Post Acute Care Choice: Skilled Nursing Facility Living arrangements for the past 2 months: Single Family Home                                       Social Determinants of Health (SDOH) Interventions SDOH Screenings   Food Insecurity: No Food Insecurity (11/24/2022)  Housing: Low Risk  (11/24/2022)  Transportation Needs: No Transportation Needs (11/24/2022)  Utilities: Patient Unable To Answer (11/24/2022)  Alcohol Screen: Low Risk  (12/17/2021)  Depression (PHQ2-9): Low Risk  (03/18/2022)  Financial Resource Strain: Low Risk  (12/17/2021)  Physical Activity: Inactive (12/17/2021)  Social Connections: Patient Declined (12/17/2021)  Stress: No Stress Concern Present (12/17/2021)  Tobacco Use: Medium Risk  (11/23/2022)    Readmission Risk Interventions    11/19/2021   10:32 AM  Readmission Risk Prevention Plan  Transportation Screening Complete  PCP or Specialist Appt within 5-7 Days Complete  Home Care Screening Complete  Medication Review (RN CM) Complete

## 2022-11-26 NOTE — Plan of Care (Signed)
  Problem: Education: Goal: Ability to demonstrate management of disease process will improve Outcome: Adequate for Discharge Goal: Ability to verbalize understanding of medication therapies will improve Outcome: Adequate for Discharge Goal: Individualized Educational Video(s) Outcome: Not Applicable

## 2022-11-26 NOTE — Progress Notes (Signed)
Performed at [facility], [address], [place], [place] [address]    Report Status 11/25/2022 FINAL  Final  Resp panel by RT-PCR (RSV, Flu A&B, Covid) Anterior Nasal Swab     Status: None   Collection Time: 11/23/22 10:10 PM   Specimen: Anterior Nasal Swab  Result Value Ref Range Status   SARS Coronavirus 2 by RT PCR NEGATIVE NEGATIVE Final   Influenza A by PCR NEGATIVE NEGATIVE Final   Influenza B by PCR NEGATIVE NEGATIVE Final    Comment: (NOTE) The Xpert Xpress SARS-CoV-2/FLU/RSV plus assay is intended as an aid in the diagnosis of influenza from Nasopharyngeal swab specimens and should not be used as a sole basis for treatment. Nasal washings and aspirates are unacceptable for Xpert Xpress SARS-CoV-2/FLU/RSV testing.  Fact Sheet for Patients: BloggerCourse.com  Fact Sheet for Healthcare Providers: SeriousBroker.it  This test is not yet approved or cleared by the [place] FDA and has been authorized for detection and/or diagnosis of SARS-CoV-2 by FDA under an Emergency Use Authorization (EUA). This EUA will remain in effect (meaning this test can be used) for the duration of the COVID-19 declaration under Section 564(b)(1) of the Act, 21 U.S.C. section 360bbb-3(b)(1), unless the authorization is terminated or revoked.     Resp Syncytial Virus by PCR NEGATIVE NEGATIVE Final    Comment: (NOTE) Fact Sheet for Patients: BloggerCourse.com  Fact Sheet for Healthcare Providers: SeriousBroker.it  This test is not yet approved or cleared by the [place] FDA and has been authorized for detection and/or  diagnosis of SARS-CoV-2 by FDA under an Emergency Use Authorization (EUA). This EUA will remain in effect (meaning this test can be used) for the duration of the COVID-19 declaration under Section 564(b)(1) of the Act, 21 U.S.C. section 360bbb-3(b)(1), unless the authorization is terminated or revoked.  Performed at [facility], [address] [place], [place] [address]          Radiology Studies: ECHOCARDIOGRAM COMPLETE  Result Date: 11/25/2022    ECHOCARDIOGRAM REPORT   Patient Name:   Yvonne Bailey Date of Exam: 11/25/2022 Medical Rec #:  [id number]           Height:       67.0 in Accession #:    [id number]          Weight:       176.1 lb Date of Birth:  Nov 26, 1940            BSA:          1.916 m Patient Age:    82 years            BP:           157/95 mmHg Patient Gender: F                   HR:           70 bpm. Exam Location:  Inpatient Procedure: 2D Echo, Color Doppler and Cardiac Doppler Indications:    Congestive Heart Failure I50.9  History:        Patient has prior history of Echocardiogram examinations, most                 recent 11/02/2020. Cardiomyopathy, Previous Myocardial Infarction                 and CAD, CAP, PAD and TIA, Arrythmias:Atrial Fibrillation; Risk  Performed at [facility], [address], [place], [place] [address]    Report Status 11/25/2022 FINAL  Final  Resp panel by RT-PCR (RSV, Flu A&B, Covid) Anterior Nasal Swab     Status: None   Collection Time: 11/23/22 10:10 PM   Specimen: Anterior Nasal Swab  Result Value Ref Range Status   SARS Coronavirus 2 by RT PCR NEGATIVE NEGATIVE Final   Influenza A by PCR NEGATIVE NEGATIVE Final   Influenza B by PCR NEGATIVE NEGATIVE Final    Comment: (NOTE) The Xpert Xpress SARS-CoV-2/FLU/RSV plus assay is intended as an aid in the diagnosis of influenza from Nasopharyngeal swab specimens and should not be used as a sole basis for treatment. Nasal washings and aspirates are unacceptable for Xpert Xpress SARS-CoV-2/FLU/RSV testing.  Fact Sheet for Patients: BloggerCourse.com  Fact Sheet for Healthcare Providers: SeriousBroker.it  This test is not yet approved or cleared by the [place] FDA and has been authorized for detection and/or diagnosis of SARS-CoV-2 by FDA under an Emergency Use Authorization (EUA). This EUA will remain in effect (meaning this test can be used) for the duration of the COVID-19 declaration under Section 564(b)(1) of the Act, 21 U.S.C. section 360bbb-3(b)(1), unless the authorization is terminated or revoked.     Resp Syncytial Virus by PCR NEGATIVE NEGATIVE Final    Comment: (NOTE) Fact Sheet for Patients: BloggerCourse.com  Fact Sheet for Healthcare Providers: SeriousBroker.it  This test is not yet approved or cleared by the [place] FDA and has been authorized for detection and/or  diagnosis of SARS-CoV-2 by FDA under an Emergency Use Authorization (EUA). This EUA will remain in effect (meaning this test can be used) for the duration of the COVID-19 declaration under Section 564(b)(1) of the Act, 21 U.S.C. section 360bbb-3(b)(1), unless the authorization is terminated or revoked.  Performed at [facility], [address] [place], [place] [address]          Radiology Studies: ECHOCARDIOGRAM COMPLETE  Result Date: 11/25/2022    ECHOCARDIOGRAM REPORT   Patient Name:   Yvonne Bailey Date of Exam: 11/25/2022 Medical Rec #:  [id number]           Height:       67.0 in Accession #:    [id number]          Weight:       176.1 lb Date of Birth:  Nov 26, 1940            BSA:          1.916 m Patient Age:    82 years            BP:           157/95 mmHg Patient Gender: F                   HR:           70 bpm. Exam Location:  Inpatient Procedure: 2D Echo, Color Doppler and Cardiac Doppler Indications:    Congestive Heart Failure I50.9  History:        Patient has prior history of Echocardiogram examinations, most                 recent 11/02/2020. Cardiomyopathy, Previous Myocardial Infarction                 and CAD, CAP, PAD and TIA, Arrythmias:Atrial Fibrillation; Risk  99.3 ml LV vol d, MOD A4C: 112.0 ml LV vol s, MOD A2C: 70.4 ml LV vol s, MOD A4C: 71.6 ml LV SV MOD A2C:     28.9 ml LV SV MOD A4C:     112.0 ml LV SV MOD BP:      34.0 ml RIGHT VENTRICLE RV S prime:     10.30 cm/s TAPSE (M-mode): 0.9 cm LEFT ATRIUM           Index        RIGHT ATRIUM           Index LA diam:      4.20 cm 2.19 cm/m   RA Area:     14.30 cm LA Vol (A2C): 30.1 ml 15.71 ml/m  RA Volume:   33.90 ml  17.69 ml/m LA Vol (A4C): 97.6 ml 50.94 ml/m   AORTA Ao Root diam: 2.90 cm Ao Asc diam:  3.10 cm MITRAL VALVE                 TRICUSPID VALVE MV Area (PHT): 4.15 cm      TR Peak grad:   27.7 mmHg MV Decel Time: 183 msec      TR Vmax:        263.00 cm/s MR Peak grad:   150.8 mmHg MR Mean grad:   95.0 mmHg    SHUNTS MR Vmax:        614.00 cm/s  Systemic Diam: 1.50 cm MR Vmean:       460.0 cm/s MR PISA:        0.57 cm MR PISA Radius: 0.30 cm MV E velocity: 159.00 cm/s MV A velocity: 43.10 cm/s MV E/A ratio:  3.69 Olga Millers MD Electronically signed by Olga Millers MD Signature Date/Time: 11/25/2022/3:29:01 PM    Final    DG Swallowing  Func-Speech Pathology  Result Date: 11/25/2022 Table formatting from the original result was not included. Modified Barium Swallow Study Patient Details Name: Yvonne Bailey MRN: [id number] Date of Birth: 12/18/1940 Today's Date: 11/25/2022 HPI/PMH: HPI: DAIZAH PERISHO is an 82 yo female presenting to ED 9/14 with AMS after a ground level fall. Found to have UTI with subsequent delirium and encephalopathy. CT Chest Abdomen Pelvis with moderate to large R and small L pleural effusions with associated atelectasis as well as moderate patchy bilateral apical ground-glass opacities which may be representative of PNA. CXR pending. Pt seen most recently by SLP for cognition 06/2022, but prior to that was seen for swallowing in 2018 without s/s of dysphagia or aspiration. 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 Oral phase inconsistently prolonged and swallow initiation inconsistently initiated at the level of the pyriform sinuses, suspected secondary to decreased cognition, but with full airway protection across consistencies. Appearance of a possible osteophyte at C5-6 causes a minimal amount of residue in esophagus post swallow but did not overall impact function. Will initiate a diet and f/u briefly for tolerance. Factors that may increase risk of adverse event in presence of aspiration Rubye Oaks & Clearance Coots 2021): Factors that may increase risk of adverse event in presence of aspiration Rubye Oaks & Clearance Coots 2021): Reduced cognitive function Recommendations/Plan: Swallowing Evaluation Recommendations Swallowing Evaluation Recommendations Recommendations: PO diet PO Diet Recommendation: Dysphagia 2 (Finely chopped); Thin liquids (Level 0) Liquid Administration via: Cup; Straw Medication Administration: Whole meds with puree (crush if needed) Supervision:  Performed at [facility], [address], [place], [place] [address]    Report Status 11/25/2022 FINAL  Final  Resp panel by RT-PCR (RSV, Flu A&B, Covid) Anterior Nasal Swab     Status: None   Collection Time: 11/23/22 10:10 PM   Specimen: Anterior Nasal Swab  Result Value Ref Range Status   SARS Coronavirus 2 by RT PCR NEGATIVE NEGATIVE Final   Influenza A by PCR NEGATIVE NEGATIVE Final   Influenza B by PCR NEGATIVE NEGATIVE Final    Comment: (NOTE) The Xpert Xpress SARS-CoV-2/FLU/RSV plus assay is intended as an aid in the diagnosis of influenza from Nasopharyngeal swab specimens and should not be used as a sole basis for treatment. Nasal washings and aspirates are unacceptable for Xpert Xpress SARS-CoV-2/FLU/RSV testing.  Fact Sheet for Patients: BloggerCourse.com  Fact Sheet for Healthcare Providers: SeriousBroker.it  This test is not yet approved or cleared by the [place] FDA and has been authorized for detection and/or diagnosis of SARS-CoV-2 by FDA under an Emergency Use Authorization (EUA). This EUA will remain in effect (meaning this test can be used) for the duration of the COVID-19 declaration under Section 564(b)(1) of the Act, 21 U.S.C. section 360bbb-3(b)(1), unless the authorization is terminated or revoked.     Resp Syncytial Virus by PCR NEGATIVE NEGATIVE Final    Comment: (NOTE) Fact Sheet for Patients: BloggerCourse.com  Fact Sheet for Healthcare Providers: SeriousBroker.it  This test is not yet approved or cleared by the [place] FDA and has been authorized for detection and/or  diagnosis of SARS-CoV-2 by FDA under an Emergency Use Authorization (EUA). This EUA will remain in effect (meaning this test can be used) for the duration of the COVID-19 declaration under Section 564(b)(1) of the Act, 21 U.S.C. section 360bbb-3(b)(1), unless the authorization is terminated or revoked.  Performed at [facility], [address] [place], [place] [address]          Radiology Studies: ECHOCARDIOGRAM COMPLETE  Result Date: 11/25/2022    ECHOCARDIOGRAM REPORT   Patient Name:   Yvonne Bailey Date of Exam: 11/25/2022 Medical Rec #:  [id number]           Height:       67.0 in Accession #:    [id number]          Weight:       176.1 lb Date of Birth:  Nov 26, 1940            BSA:          1.916 m Patient Age:    82 years            BP:           157/95 mmHg Patient Gender: F                   HR:           70 bpm. Exam Location:  Inpatient Procedure: 2D Echo, Color Doppler and Cardiac Doppler Indications:    Congestive Heart Failure I50.9  History:        Patient has prior history of Echocardiogram examinations, most                 recent 11/02/2020. Cardiomyopathy, Previous Myocardial Infarction                 and CAD, CAP, PAD and TIA, Arrythmias:Atrial Fibrillation; Risk  99.3 ml LV vol d, MOD A4C: 112.0 ml LV vol s, MOD A2C: 70.4 ml LV vol s, MOD A4C: 71.6 ml LV SV MOD A2C:     28.9 ml LV SV MOD A4C:     112.0 ml LV SV MOD BP:      34.0 ml RIGHT VENTRICLE RV S prime:     10.30 cm/s TAPSE (M-mode): 0.9 cm LEFT ATRIUM           Index        RIGHT ATRIUM           Index LA diam:      4.20 cm 2.19 cm/m   RA Area:     14.30 cm LA Vol (A2C): 30.1 ml 15.71 ml/m  RA Volume:   33.90 ml  17.69 ml/m LA Vol (A4C): 97.6 ml 50.94 ml/m   AORTA Ao Root diam: 2.90 cm Ao Asc diam:  3.10 cm MITRAL VALVE                 TRICUSPID VALVE MV Area (PHT): 4.15 cm      TR Peak grad:   27.7 mmHg MV Decel Time: 183 msec      TR Vmax:        263.00 cm/s MR Peak grad:   150.8 mmHg MR Mean grad:   95.0 mmHg    SHUNTS MR Vmax:        614.00 cm/s  Systemic Diam: 1.50 cm MR Vmean:       460.0 cm/s MR PISA:        0.57 cm MR PISA Radius: 0.30 cm MV E velocity: 159.00 cm/s MV A velocity: 43.10 cm/s MV E/A ratio:  3.69 Olga Millers MD Electronically signed by Olga Millers MD Signature Date/Time: 11/25/2022/3:29:01 PM    Final    DG Swallowing  Func-Speech Pathology  Result Date: 11/25/2022 Table formatting from the original result was not included. Modified Barium Swallow Study Patient Details Name: Yvonne Bailey MRN: [id number] Date of Birth: 12/18/1940 Today's Date: 11/25/2022 HPI/PMH: HPI: DAIZAH PERISHO is an 82 yo female presenting to ED 9/14 with AMS after a ground level fall. Found to have UTI with subsequent delirium and encephalopathy. CT Chest Abdomen Pelvis with moderate to large R and small L pleural effusions with associated atelectasis as well as moderate patchy bilateral apical ground-glass opacities which may be representative of PNA. CXR pending. Pt seen most recently by SLP for cognition 06/2022, but prior to that was seen for swallowing in 2018 without s/s of dysphagia or aspiration. 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 Oral phase inconsistently prolonged and swallow initiation inconsistently initiated at the level of the pyriform sinuses, suspected secondary to decreased cognition, but with full airway protection across consistencies. Appearance of a possible osteophyte at C5-6 causes a minimal amount of residue in esophagus post swallow but did not overall impact function. Will initiate a diet and f/u briefly for tolerance. Factors that may increase risk of adverse event in presence of aspiration Rubye Oaks & Clearance Coots 2021): Factors that may increase risk of adverse event in presence of aspiration Rubye Oaks & Clearance Coots 2021): Reduced cognitive function Recommendations/Plan: Swallowing Evaluation Recommendations Swallowing Evaluation Recommendations Recommendations: PO diet PO Diet Recommendation: Dysphagia 2 (Finely chopped); Thin liquids (Level 0) Liquid Administration via: Cup; Straw Medication Administration: Whole meds with puree (crush if needed) Supervision:  99.3 ml LV vol d, MOD A4C: 112.0 ml LV vol s, MOD A2C: 70.4 ml LV vol s, MOD A4C: 71.6 ml LV SV MOD A2C:     28.9 ml LV SV MOD A4C:     112.0 ml LV SV MOD BP:      34.0 ml RIGHT VENTRICLE RV S prime:     10.30 cm/s TAPSE (M-mode): 0.9 cm LEFT ATRIUM           Index        RIGHT ATRIUM           Index LA diam:      4.20 cm 2.19 cm/m   RA Area:     14.30 cm LA Vol (A2C): 30.1 ml 15.71 ml/m  RA Volume:   33.90 ml  17.69 ml/m LA Vol (A4C): 97.6 ml 50.94 ml/m   AORTA Ao Root diam: 2.90 cm Ao Asc diam:  3.10 cm MITRAL VALVE                 TRICUSPID VALVE MV Area (PHT): 4.15 cm      TR Peak grad:   27.7 mmHg MV Decel Time: 183 msec      TR Vmax:        263.00 cm/s MR Peak grad:   150.8 mmHg MR Mean grad:   95.0 mmHg    SHUNTS MR Vmax:        614.00 cm/s  Systemic Diam: 1.50 cm MR Vmean:       460.0 cm/s MR PISA:        0.57 cm MR PISA Radius: 0.30 cm MV E velocity: 159.00 cm/s MV A velocity: 43.10 cm/s MV E/A ratio:  3.69 Olga Millers MD Electronically signed by Olga Millers MD Signature Date/Time: 11/25/2022/3:29:01 PM    Final    DG Swallowing  Func-Speech Pathology  Result Date: 11/25/2022 Table formatting from the original result was not included. Modified Barium Swallow Study Patient Details Name: Yvonne Bailey MRN: [id number] Date of Birth: 12/18/1940 Today's Date: 11/25/2022 HPI/PMH: HPI: DAIZAH PERISHO is an 82 yo female presenting to ED 9/14 with AMS after a ground level fall. Found to have UTI with subsequent delirium and encephalopathy. CT Chest Abdomen Pelvis with moderate to large R and small L pleural effusions with associated atelectasis as well as moderate patchy bilateral apical ground-glass opacities which may be representative of PNA. CXR pending. Pt seen most recently by SLP for cognition 06/2022, but prior to that was seen for swallowing in 2018 without s/s of dysphagia or aspiration. 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 Oral phase inconsistently prolonged and swallow initiation inconsistently initiated at the level of the pyriform sinuses, suspected secondary to decreased cognition, but with full airway protection across consistencies. Appearance of a possible osteophyte at C5-6 causes a minimal amount of residue in esophagus post swallow but did not overall impact function. Will initiate a diet and f/u briefly for tolerance. Factors that may increase risk of adverse event in presence of aspiration Rubye Oaks & Clearance Coots 2021): Factors that may increase risk of adverse event in presence of aspiration Rubye Oaks & Clearance Coots 2021): Reduced cognitive function Recommendations/Plan: Swallowing Evaluation Recommendations Swallowing Evaluation Recommendations Recommendations: PO diet PO Diet Recommendation: Dysphagia 2 (Finely chopped); Thin liquids (Level 0) Liquid Administration via: Cup; Straw Medication Administration: Whole meds with puree (crush if needed) Supervision:  Performed at [facility], [address], [place], [place] [address]    Report Status 11/25/2022 FINAL  Final  Resp panel by RT-PCR (RSV, Flu A&B, Covid) Anterior Nasal Swab     Status: None   Collection Time: 11/23/22 10:10 PM   Specimen: Anterior Nasal Swab  Result Value Ref Range Status   SARS Coronavirus 2 by RT PCR NEGATIVE NEGATIVE Final   Influenza A by PCR NEGATIVE NEGATIVE Final   Influenza B by PCR NEGATIVE NEGATIVE Final    Comment: (NOTE) The Xpert Xpress SARS-CoV-2/FLU/RSV plus assay is intended as an aid in the diagnosis of influenza from Nasopharyngeal swab specimens and should not be used as a sole basis for treatment. Nasal washings and aspirates are unacceptable for Xpert Xpress SARS-CoV-2/FLU/RSV testing.  Fact Sheet for Patients: BloggerCourse.com  Fact Sheet for Healthcare Providers: SeriousBroker.it  This test is not yet approved or cleared by the [place] FDA and has been authorized for detection and/or diagnosis of SARS-CoV-2 by FDA under an Emergency Use Authorization (EUA). This EUA will remain in effect (meaning this test can be used) for the duration of the COVID-19 declaration under Section 564(b)(1) of the Act, 21 U.S.C. section 360bbb-3(b)(1), unless the authorization is terminated or revoked.     Resp Syncytial Virus by PCR NEGATIVE NEGATIVE Final    Comment: (NOTE) Fact Sheet for Patients: BloggerCourse.com  Fact Sheet for Healthcare Providers: SeriousBroker.it  This test is not yet approved or cleared by the [place] FDA and has been authorized for detection and/or  diagnosis of SARS-CoV-2 by FDA under an Emergency Use Authorization (EUA). This EUA will remain in effect (meaning this test can be used) for the duration of the COVID-19 declaration under Section 564(b)(1) of the Act, 21 U.S.C. section 360bbb-3(b)(1), unless the authorization is terminated or revoked.  Performed at [facility], [address] [place], [place] [address]          Radiology Studies: ECHOCARDIOGRAM COMPLETE  Result Date: 11/25/2022    ECHOCARDIOGRAM REPORT   Patient Name:   Yvonne Bailey Date of Exam: 11/25/2022 Medical Rec #:  [id number]           Height:       67.0 in Accession #:    [id number]          Weight:       176.1 lb Date of Birth:  Nov 26, 1940            BSA:          1.916 m Patient Age:    82 years            BP:           157/95 mmHg Patient Gender: F                   HR:           70 bpm. Exam Location:  Inpatient Procedure: 2D Echo, Color Doppler and Cardiac Doppler Indications:    Congestive Heart Failure I50.9  History:        Patient has prior history of Echocardiogram examinations, most                 recent 11/02/2020. Cardiomyopathy, Previous Myocardial Infarction                 and CAD, CAP, PAD and TIA, Arrythmias:Atrial Fibrillation; Risk  Performed at [facility], [address], [place], [place] [address]    Report Status 11/25/2022 FINAL  Final  Resp panel by RT-PCR (RSV, Flu A&B, Covid) Anterior Nasal Swab     Status: None   Collection Time: 11/23/22 10:10 PM   Specimen: Anterior Nasal Swab  Result Value Ref Range Status   SARS Coronavirus 2 by RT PCR NEGATIVE NEGATIVE Final   Influenza A by PCR NEGATIVE NEGATIVE Final   Influenza B by PCR NEGATIVE NEGATIVE Final    Comment: (NOTE) The Xpert Xpress SARS-CoV-2/FLU/RSV plus assay is intended as an aid in the diagnosis of influenza from Nasopharyngeal swab specimens and should not be used as a sole basis for treatment. Nasal washings and aspirates are unacceptable for Xpert Xpress SARS-CoV-2/FLU/RSV testing.  Fact Sheet for Patients: BloggerCourse.com  Fact Sheet for Healthcare Providers: SeriousBroker.it  This test is not yet approved or cleared by the [place] FDA and has been authorized for detection and/or diagnosis of SARS-CoV-2 by FDA under an Emergency Use Authorization (EUA). This EUA will remain in effect (meaning this test can be used) for the duration of the COVID-19 declaration under Section 564(b)(1) of the Act, 21 U.S.C. section 360bbb-3(b)(1), unless the authorization is terminated or revoked.     Resp Syncytial Virus by PCR NEGATIVE NEGATIVE Final    Comment: (NOTE) Fact Sheet for Patients: BloggerCourse.com  Fact Sheet for Healthcare Providers: SeriousBroker.it  This test is not yet approved or cleared by the [place] FDA and has been authorized for detection and/or  diagnosis of SARS-CoV-2 by FDA under an Emergency Use Authorization (EUA). This EUA will remain in effect (meaning this test can be used) for the duration of the COVID-19 declaration under Section 564(b)(1) of the Act, 21 U.S.C. section 360bbb-3(b)(1), unless the authorization is terminated or revoked.  Performed at [facility], [address] [place], [place] [address]          Radiology Studies: ECHOCARDIOGRAM COMPLETE  Result Date: 11/25/2022    ECHOCARDIOGRAM REPORT   Patient Name:   Yvonne Bailey Date of Exam: 11/25/2022 Medical Rec #:  [id number]           Height:       67.0 in Accession #:    [id number]          Weight:       176.1 lb Date of Birth:  Nov 26, 1940            BSA:          1.916 m Patient Age:    82 years            BP:           157/95 mmHg Patient Gender: F                   HR:           70 bpm. Exam Location:  Inpatient Procedure: 2D Echo, Color Doppler and Cardiac Doppler Indications:    Congestive Heart Failure I50.9  History:        Patient has prior history of Echocardiogram examinations, most                 recent 11/02/2020. Cardiomyopathy, Previous Myocardial Infarction                 and CAD, CAP, PAD and TIA, Arrythmias:Atrial Fibrillation; Risk  Performed at [facility], [address], [place], [place] [address]    Report Status 11/25/2022 FINAL  Final  Resp panel by RT-PCR (RSV, Flu A&B, Covid) Anterior Nasal Swab     Status: None   Collection Time: 11/23/22 10:10 PM   Specimen: Anterior Nasal Swab  Result Value Ref Range Status   SARS Coronavirus 2 by RT PCR NEGATIVE NEGATIVE Final   Influenza A by PCR NEGATIVE NEGATIVE Final   Influenza B by PCR NEGATIVE NEGATIVE Final    Comment: (NOTE) The Xpert Xpress SARS-CoV-2/FLU/RSV plus assay is intended as an aid in the diagnosis of influenza from Nasopharyngeal swab specimens and should not be used as a sole basis for treatment. Nasal washings and aspirates are unacceptable for Xpert Xpress SARS-CoV-2/FLU/RSV testing.  Fact Sheet for Patients: BloggerCourse.com  Fact Sheet for Healthcare Providers: [employer]  This test is not yet approved or cleared by the [place] FDA and has been authorized for detection and/or diagnosis of SARS-CoV-2 by FDA under an Emergency Use Authorization (EUA). This EUA will remain in effect (meaning this test can be used) for the duration of the COVID-19 declaration under Section 564(b)(1) of the Act, 21 U.S.C. section 360bbb-3(b)(1), unless the authorization is terminated or revoked.     Resp Syncytial Virus by PCR NEGATIVE NEGATIVE Final    Comment: (NOTE) Fact Sheet for Patients: BloggerCourse.com  Fact Sheet for Healthcare Providers: [employer]  This test is not yet approved or cleared by the [place] FDA and has been authorized for detection and/or  diagnosis of SARS-CoV-2 by FDA under an Emergency Use Authorization (EUA). This EUA will remain in effect (meaning this test can be used) for the duration of the COVID-19 declaration under Section 564(b)(1) of the Act, 21 U.S.C. section 360bbb-3(b)(1), unless the authorization is terminated or revoked.  Performed at [facility], [address] [place], [place] [address]          Radiology Studies: ECHOCARDIOGRAM COMPLETE  Result Date: 11/25/2022    ECHOCARDIOGRAM REPORT   Patient Name:   Yvonne Bailey Date of Exam: 11/25/2022 Medical Rec #:  [id number]           Height:       67.0 in Accession #:    [id number]          Weight:       176.1 lb Date of Birth:  Nov 26, 1940            BSA:          1.916 m Patient Age:    82 years            BP:           157/95 mmHg Patient Gender: F                   HR:           70 bpm. Exam Location:  Inpatient Procedure: 2D Echo, Color Doppler and Cardiac Doppler Indications:    Congestive Heart Failure I50.9  History:        Patient has prior history of Echocardiogram examinations, most                 recent 11/02/2020. Cardiomyopathy, Previous Myocardial Infarction                 and CAD, CAP, PAD and TIA, Arrythmias:Atrial Fibrillation; Risk  99.3 ml LV vol d, MOD A4C: 112.0 ml LV vol s, MOD A2C: 70.4 ml LV vol s, MOD A4C: 71.6 ml LV SV MOD A2C:     28.9 ml LV SV MOD A4C:     112.0 ml LV SV MOD BP:      34.0 ml RIGHT VENTRICLE RV S prime:     10.30 cm/s TAPSE (M-mode): 0.9 cm LEFT ATRIUM           Index        RIGHT ATRIUM           Index LA diam:      4.20 cm 2.19 cm/m   RA Area:     14.30 cm LA Vol (A2C): 30.1 ml 15.71 ml/m  RA Volume:   33.90 ml  17.69 ml/m LA Vol (A4C): 97.6 ml 50.94 ml/m   AORTA Ao Root diam: 2.90 cm Ao Asc diam:  3.10 cm MITRAL VALVE                 TRICUSPID VALVE MV Area (PHT): 4.15 cm      TR Peak grad:   27.7 mmHg MV Decel Time: 183 msec      TR Vmax:        263.00 cm/s MR Peak grad:   150.8 mmHg MR Mean grad:   95.0 mmHg    SHUNTS MR Vmax:        614.00 cm/s  Systemic Diam: 1.50 cm MR Vmean:       460.0 cm/s MR PISA:        0.57 cm MR PISA Radius: 0.30 cm MV E velocity: 159.00 cm/s MV A velocity: 43.10 cm/s MV E/A ratio:  3.69 Olga Millers MD Electronically signed by Olga Millers MD Signature Date/Time: 11/25/2022/3:29:01 PM    Final    DG Swallowing  Func-Speech Pathology  Result Date: 11/25/2022 Table formatting from the original result was not included. Modified Barium Swallow Study Patient Details Name: Yvonne Bailey MRN: [id number] Date of Birth: 12/18/1940 Today's Date: 11/25/2022 HPI/PMH: HPI: DAIZAH PERISHO is an 82 yo female presenting to ED 9/14 with AMS after a ground level fall. Found to have UTI with subsequent delirium and encephalopathy. CT Chest Abdomen Pelvis with moderate to large R and small L pleural effusions with associated atelectasis as well as moderate patchy bilateral apical ground-glass opacities which may be representative of PNA. CXR pending. Pt seen most recently by SLP for cognition 06/2022, but prior to that was seen for swallowing in 2018 without s/s of dysphagia or aspiration. 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 Oral phase inconsistently prolonged and swallow initiation inconsistently initiated at the level of the pyriform sinuses, suspected secondary to decreased cognition, but with full airway protection across consistencies. Appearance of a possible osteophyte at C5-6 causes a minimal amount of residue in esophagus post swallow but did not overall impact function. Will initiate a diet and f/u briefly for tolerance. Factors that may increase risk of adverse event in presence of aspiration Rubye Oaks [employer]ance Coots 2021): Factors that may increase risk of adverse event in presence of aspiration Rubye Oaks [employer] Clearance Coots 2021): Reduced cognitive function Recommendations/Plan: Swallowing Evaluation Recommendations Swallowing Evaluation Recommendations Recommendations: PO diet PO Diet Recommendation: Dysphagia 2 (Finely chopped); Thin liquids (Level 0) Liquid Administration via: Cup; Straw Medication Administration: Whole meds with puree (crush if needed) Supervision:

## 2022-11-27 ENCOUNTER — Inpatient Hospital Stay (HOSPITAL_COMMUNITY): Payer: Medicare HMO

## 2022-11-27 ENCOUNTER — Encounter (HOSPITAL_COMMUNITY): Payer: Medicare HMO

## 2022-11-27 DIAGNOSIS — F03B0 Unspecified dementia, moderate, without behavioral disturbance, psychotic disturbance, mood disturbance, and anxiety: Secondary | ICD-10-CM | POA: Diagnosis not present

## 2022-11-27 DIAGNOSIS — I4891 Unspecified atrial fibrillation: Secondary | ICD-10-CM | POA: Diagnosis not present

## 2022-11-27 DIAGNOSIS — R531 Weakness: Secondary | ICD-10-CM | POA: Diagnosis not present

## 2022-11-27 DIAGNOSIS — N1831 Chronic kidney disease, stage 3a: Secondary | ICD-10-CM | POA: Diagnosis not present

## 2022-11-27 DIAGNOSIS — G9341 Metabolic encephalopathy: Secondary | ICD-10-CM | POA: Diagnosis not present

## 2022-11-27 DIAGNOSIS — Z7901 Long term (current) use of anticoagulants: Secondary | ICD-10-CM | POA: Diagnosis not present

## 2022-11-27 DIAGNOSIS — Z7189 Other specified counseling: Secondary | ICD-10-CM | POA: Diagnosis not present

## 2022-11-27 DIAGNOSIS — R278 Other lack of coordination: Secondary | ICD-10-CM | POA: Diagnosis not present

## 2022-11-27 DIAGNOSIS — F4489 Other dissociative and conversion disorders: Secondary | ICD-10-CM | POA: Diagnosis not present

## 2022-11-27 DIAGNOSIS — I517 Cardiomegaly: Secondary | ICD-10-CM | POA: Diagnosis not present

## 2022-11-27 DIAGNOSIS — I10 Essential (primary) hypertension: Secondary | ICD-10-CM | POA: Diagnosis not present

## 2022-11-27 DIAGNOSIS — I2510 Atherosclerotic heart disease of native coronary artery without angina pectoris: Secondary | ICD-10-CM | POA: Diagnosis not present

## 2022-11-27 DIAGNOSIS — J90 Pleural effusion, not elsewhere classified: Secondary | ICD-10-CM | POA: Diagnosis not present

## 2022-11-27 DIAGNOSIS — R1311 Dysphagia, oral phase: Secondary | ICD-10-CM | POA: Diagnosis not present

## 2022-11-27 DIAGNOSIS — R918 Other nonspecific abnormal finding of lung field: Secondary | ICD-10-CM | POA: Diagnosis not present

## 2022-11-27 DIAGNOSIS — R41841 Cognitive communication deficit: Secondary | ICD-10-CM | POA: Diagnosis not present

## 2022-11-27 DIAGNOSIS — I491 Atrial premature depolarization: Secondary | ICD-10-CM | POA: Diagnosis not present

## 2022-11-27 DIAGNOSIS — N3281 Overactive bladder: Secondary | ICD-10-CM | POA: Diagnosis not present

## 2022-11-27 DIAGNOSIS — R2689 Other abnormalities of gait and mobility: Secondary | ICD-10-CM | POA: Diagnosis not present

## 2022-11-27 DIAGNOSIS — N390 Urinary tract infection, site not specified: Secondary | ICD-10-CM | POA: Diagnosis not present

## 2022-11-27 DIAGNOSIS — F419 Anxiety disorder, unspecified: Secondary | ICD-10-CM | POA: Diagnosis not present

## 2022-11-27 DIAGNOSIS — Z7401 Bed confinement status: Secondary | ICD-10-CM | POA: Diagnosis not present

## 2022-11-27 DIAGNOSIS — Z515 Encounter for palliative care: Secondary | ICD-10-CM

## 2022-11-27 DIAGNOSIS — E876 Hypokalemia: Secondary | ICD-10-CM | POA: Diagnosis not present

## 2022-11-27 DIAGNOSIS — M6281 Muscle weakness (generalized): Secondary | ICD-10-CM | POA: Diagnosis not present

## 2022-11-27 DIAGNOSIS — Z79899 Other long term (current) drug therapy: Secondary | ICD-10-CM | POA: Diagnosis not present

## 2022-11-27 DIAGNOSIS — I480 Paroxysmal atrial fibrillation: Secondary | ICD-10-CM | POA: Diagnosis not present

## 2022-11-27 DIAGNOSIS — N183 Chronic kidney disease, stage 3 unspecified: Secondary | ICD-10-CM | POA: Diagnosis not present

## 2022-11-27 DIAGNOSIS — G459 Transient cerebral ischemic attack, unspecified: Secondary | ICD-10-CM | POA: Diagnosis not present

## 2022-11-27 DIAGNOSIS — R4182 Altered mental status, unspecified: Secondary | ICD-10-CM | POA: Diagnosis not present

## 2022-11-27 DIAGNOSIS — R456 Violent behavior: Secondary | ICD-10-CM | POA: Diagnosis not present

## 2022-11-27 DIAGNOSIS — R1312 Dysphagia, oropharyngeal phase: Secondary | ICD-10-CM | POA: Diagnosis not present

## 2022-11-27 DIAGNOSIS — F03C11 Unspecified dementia, severe, with agitation: Secondary | ICD-10-CM | POA: Diagnosis not present

## 2022-11-27 DIAGNOSIS — E039 Hypothyroidism, unspecified: Secondary | ICD-10-CM | POA: Diagnosis not present

## 2022-11-27 DIAGNOSIS — R4701 Aphasia: Secondary | ICD-10-CM | POA: Diagnosis not present

## 2022-11-27 DIAGNOSIS — I129 Hypertensive chronic kidney disease with stage 1 through stage 4 chronic kidney disease, or unspecified chronic kidney disease: Secondary | ICD-10-CM | POA: Diagnosis not present

## 2022-11-27 DIAGNOSIS — Z8673 Personal history of transient ischemic attack (TIA), and cerebral infarction without residual deficits: Secondary | ICD-10-CM | POA: Diagnosis not present

## 2022-11-27 DIAGNOSIS — N3000 Acute cystitis without hematuria: Secondary | ICD-10-CM | POA: Diagnosis not present

## 2022-11-27 DIAGNOSIS — Z7982 Long term (current) use of aspirin: Secondary | ICD-10-CM | POA: Diagnosis not present

## 2022-11-27 DIAGNOSIS — E785 Hyperlipidemia, unspecified: Secondary | ICD-10-CM | POA: Diagnosis not present

## 2022-11-27 MED ORDER — SENNOSIDES-DOCUSATE SODIUM 8.6-50 MG PO TABS: 2.0000 | ORAL_TABLET | Freq: Two times a day (BID) | ORAL | 0 refills | Status: AC

## 2022-11-27 MED ORDER — METOPROLOL TARTRATE 25 MG PO TABS: 12.5000 mg | ORAL_TABLET | Freq: Two times a day (BID) | ORAL | 0 refills | Status: DC

## 2022-11-27 MED ORDER — ATORVASTATIN CALCIUM 40 MG PO TABS: 80.0000 mg | ORAL_TABLET | Freq: Every day | ORAL | Status: DC

## 2022-11-27 MED ORDER — SERTRALINE HCL 50 MG PO TABS: 50.0000 mg | ORAL_TABLET | Freq: Every day | ORAL | Status: DC

## 2022-11-27 MED ORDER — LOSARTAN POTASSIUM 25 MG PO TABS: 25.0000 mg | ORAL_TABLET | Freq: Every day | ORAL | 0 refills | Status: DC

## 2022-11-27 MED ORDER — POLYETHYLENE GLYCOL 3350 17 G PO PACK: 17.0000 g | PACK | Freq: Every day | ORAL | Status: AC | PRN

## 2022-11-27 MED ORDER — FUROSEMIDE 20 MG PO TABS: 20.0000 mg | ORAL_TABLET | Freq: Every day | ORAL | 0 refills | Status: AC

## 2022-11-27 NOTE — Progress Notes (Signed)
Report called to facility but RN unavailable at this time, this RN left phone number to call to receive report

## 2022-11-27 NOTE — Discharge Summary (Signed)
Physician Discharge Summary  [name] JSE:[id number] DOB: 14-Oct-1940 DOA: 11/23/2022  PCP: Pincus Sanes, MD  Admit date: 11/23/2022 Discharge date: 11/27/2022  Admitted From: Home Disposition: SNF  Recommendations for Outpatient Follow-up:  Follow up with SNF provider at earliest convenience Outpatient follow-up with cardiology and palliative care Follow up in ED if symptoms worsen or new appear   Home Health: No Equipment/Devices: None  Discharge Condition: Guarded CODE STATUS: Full Diet recommendation: Heart healthy/diet as per SLP recommendations  Brief/Interim Summary: 82 y.o. female with past medical history significant for dementia, CVA, CAD, chronic systolic congestive heart failure, atrial fibrillation on Eliquis, CKD stage IIIa, hypothyroidism, HTN, HLD, prediabetes presented with worsening confusion and ground-level fall.  She was found to have acute metabolic encephalopathy from possible UTI and pneumonia and was treated with IV antibiotics.  She was also found to have moderate to large right pleural effusion and stercoral colitis.  She received Fleet enema.  During the hospitalization, her condition is improved.  Blood cultures have been negative so far.  Urine cultures showed less than 10,000 colonies of insignificant growth.  Today is day 5 of antibiotics.  She will not need any more antibiotics.  PT recommended SNF placement.  Overall prognosis is poor.  Palliative care evaluation requested: This can happen as an outpatient.  Echo showed EF of 30 to 35%: Started on losartan, Lasix and metoprolol.  Discharge patient to SNF today.    Discharge Diagnoses:   Acute metabolic encephalopathy Delirium -Possibly from UTI and pneumonia.  Resolved. -Currently mental status is possibly at baseline: Patient has dementia  Community-acquired pneumonia -Treated with IV antibiotics.  Blood cultures negative so far.  Today is day 5 of antibiotic.  No need for antibiotics on  discharge  History of recent UTI -Was treated with Macrobid as an outpatient. Urine cultures showed less than 10,000 colonies of insignificant growth.   Constipation/stercoral colitis -Underwent disimpaction in the ED.  Continue Senokot along with as needed MiraLAX.  Chronic systolic heart failure, compensated Moderate right pleural effusion Elevated troponins: Possibly from demand ischemia; troponins remain flat with no concerning dynamic EKG changes -Started on metoprolol, losartan, Lasix during this hospitalization.  Echo showed EF of 30 to 35%.  Continue diet and fluid restriction.  Outpatient follow-up with cardiology -Patient is currently on room air with no respiratory distress.  Dysphagia -Continue dysphagia diet as per SLP recommendations.  Paroxysmal A-fib Essential hypertension Hyperlipidemia -Currently rate controlled.  Continue Eliquis, metoprolol.  Continue losartan and Lasix. -Continue statin  Depression -Continue sertraline  CKD stage IIIa Currently at baseline.  Outpatient follow-up  Hypothyroidism -Continue levothyroxine  Physical deconditioning Ground-level fall -Imaging during the hospitalization was unrevealing with no fracture/dislocation. -Needs SNF placement as per PT.  Discharge Instructions  Discharge Instructions     Amb Referral to Palliative Care   Complete by: As directed    Ambulatory referral to Cardiology   Complete by: As directed    Diet - low sodium heart healthy   Complete by: As directed    Increase activity slowly   Complete by: As directed       Allergies as of 11/27/2022       Reactions   Definity [perflutren Lipid Microsphere] Other (See Comments)   Patient experienced back pain with injection   Pletal [cilostazol] Other (See Comments)   Made pt "feel funny"        Medication List     STOP taking these medications    nitrofurantoin (macrocrystal-monohydrate)  4.60 cm      LV e' medial:    5.15 cm/s LV PW:         1.40 cm      LV E/e' medial:  30.9 LV IVS:        0.70 cm      LV e' lateral:   6.25 cm/s LVOT diam:     1.50 cm      LV E/e' lateral: 25.4 LVOT Area:     1.77 cm  LV Volumes (MOD) LV vol d, MOD A2C: 99.3 ml LV vol d, MOD A4C: 112.0 ml LV vol s, MOD A2C: 70.4 ml LV vol s, MOD A4C: 71.6 ml LV SV MOD A2C:     28.9 ml LV SV MOD A4C:     112.0 ml LV SV MOD BP:      34.0 ml RIGHT VENTRICLE RV S prime:     10.30 cm/s TAPSE (M-mode): 0.9 cm LEFT ATRIUM           Index        RIGHT ATRIUM           Index LA diam:      4.20 cm 2.19 cm/m   RA Area:     14.30 cm LA Vol (A2C): 30.1 ml 15.71 ml/m  RA Volume:   33.90 ml  17.69 ml/m LA Vol (A4C): 97.6 ml 50.94 ml/m   AORTA Ao Root diam: 2.90 cm Ao Asc diam:  3.10 cm MITRAL VALVE                 TRICUSPID VALVE MV Area (PHT): 4.15 cm      TR Peak grad:   27.7 mmHg MV Decel Time: 183 msec      TR Vmax:        263.00 cm/s MR Peak grad:   150.8 mmHg MR Mean grad:   95.0 mmHg    SHUNTS MR Vmax:        614.00 cm/s  Systemic Diam: 1.50 cm MR Vmean:       460.0 cm/s MR PISA:        0.57 cm MR PISA Radius: 0.30 cm MV E velocity: 159.00 cm/s MV A velocity: 43.10 cm/s MV E/A ratio:  3.69 Olga Millers MD Electronically signed by Olga Millers MD Signature Date/Time: 11/25/2022/3:29:01 PM    Final    DG Swallowing Func-Speech Pathology  Result Date: 11/25/2022 Table formatting  from the original result was not included. Modified Barium Swallow Study Patient Details Name: [name] MRN: [id number] Date of Birth: 1941/03/02 Today's Date: 11/25/2022 HPI/PMH: HPI: Yvonne Bailey is an 82 yo female presenting to ED 9/14 with AMS after a ground level fall. Found to have UTI with subsequent delirium and encephalopathy. CT Chest Abdomen Pelvis with moderate to large R and small L pleural effusions with associated atelectasis as well as moderate patchy bilateral apical ground-glass opacities which may be representative of PNA. CXR pending. Pt seen most recently by SLP for cognition 06/2022, but prior to that was seen for swallowing in 2018 without s/s of dysphagia or aspiration. 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 Oral phase inconsistently prolonged and swallow initiation inconsistently initiated at the level of the pyriform sinuses, suspected secondary to decreased cognition, but with full airway protection across consistencies. Appearance of a possible osteophyte at C5-6 causes a  Physician Discharge Summary  [name] JSE:[id number] DOB: 14-Oct-1940 DOA: 11/23/2022  PCP: Pincus Sanes, MD  Admit date: 11/23/2022 Discharge date: 11/27/2022  Admitted From: Home Disposition: SNF  Recommendations for Outpatient Follow-up:  Follow up with SNF provider at earliest convenience Outpatient follow-up with cardiology and palliative care Follow up in ED if symptoms worsen or new appear   Home Health: No Equipment/Devices: None  Discharge Condition: Guarded CODE STATUS: Full Diet recommendation: Heart healthy/diet as per SLP recommendations  Brief/Interim Summary: 82 y.o. female with past medical history significant for dementia, CVA, CAD, chronic systolic congestive heart failure, atrial fibrillation on Eliquis, CKD stage IIIa, hypothyroidism, HTN, HLD, prediabetes presented with worsening confusion and ground-level fall.  She was found to have acute metabolic encephalopathy from possible UTI and pneumonia and was treated with IV antibiotics.  She was also found to have moderate to large right pleural effusion and stercoral colitis.  She received Fleet enema.  During the hospitalization, her condition is improved.  Blood cultures have been negative so far.  Urine cultures showed less than 10,000 colonies of insignificant growth.  Today is day 5 of antibiotics.  She will not need any more antibiotics.  PT recommended SNF placement.  Overall prognosis is poor.  Palliative care evaluation requested: This can happen as an outpatient.  Echo showed EF of 30 to 35%: Started on losartan, Lasix and metoprolol.  Discharge patient to SNF today.    Discharge Diagnoses:   Acute metabolic encephalopathy Delirium -Possibly from UTI and pneumonia.  Resolved. -Currently mental status is possibly at baseline: Patient has dementia  Community-acquired pneumonia -Treated with IV antibiotics.  Blood cultures negative so far.  Today is day 5 of antibiotic.  No need for antibiotics on  discharge  History of recent UTI -Was treated with Macrobid as an outpatient. Urine cultures showed less than 10,000 colonies of insignificant growth.   Constipation/stercoral colitis -Underwent disimpaction in the ED.  Continue Senokot along with as needed MiraLAX.  Chronic systolic heart failure, compensated Moderate right pleural effusion Elevated troponins: Possibly from demand ischemia; troponins remain flat with no concerning dynamic EKG changes -Started on metoprolol, losartan, Lasix during this hospitalization.  Echo showed EF of 30 to 35%.  Continue diet and fluid restriction.  Outpatient follow-up with cardiology -Patient is currently on room air with no respiratory distress.  Dysphagia -Continue dysphagia diet as per SLP recommendations.  Paroxysmal A-fib Essential hypertension Hyperlipidemia -Currently rate controlled.  Continue Eliquis, metoprolol.  Continue losartan and Lasix. -Continue statin  Depression -Continue sertraline  CKD stage IIIa Currently at baseline.  Outpatient follow-up  Hypothyroidism -Continue levothyroxine  Physical deconditioning Ground-level fall -Imaging during the hospitalization was unrevealing with no fracture/dislocation. -Needs SNF placement as per PT.  Discharge Instructions  Discharge Instructions     Amb Referral to Palliative Care   Complete by: As directed    Ambulatory referral to Cardiology   Complete by: As directed    Diet - low sodium heart healthy   Complete by: As directed    Increase activity slowly   Complete by: As directed       Allergies as of 11/27/2022       Reactions   Definity [perflutren Lipid Microsphere] Other (See Comments)   Patient experienced back pain with injection   Pletal [cilostazol] Other (See Comments)   Made pt "feel funny"        Medication List     STOP taking these medications    nitrofurantoin (macrocrystal-monohydrate)  100 MG capsule Commonly known as: Macrobid        TAKE these medications    aspirin EC 81 MG tablet Take 81 mg by mouth daily. Swallow whole.   atorvastatin 40 MG tablet Commonly known as: LIPITOR Take 2 tablets (80 mg total) by mouth daily.   dorzolamide-timolol 2-0.5 % ophthalmic solution Commonly known as: COSOPT Place 1 drop into both eyes in the morning, at noon, and at bedtime.   Eliquis 5 MG Tabs tablet Generic drug: apixaban TAKE 1 TABLET(5 MG) BY MOUTH TWICE DAILY   furosemide 20 MG tablet Commonly known as: LASIX Take 1 tablet (20 mg total) by mouth daily. Start taking on: November 28, 2022   levothyroxine 100 MCG tablet Commonly known as: SYNTHROID Take 100 mcg by mouth daily before breakfast.   losartan 25 MG tablet Commonly known as: COZAAR Take 1 tablet (25 mg total) by mouth daily. Start taking on: November 28, 2022   metoprolol tartrate 25 MG tablet Commonly known as: LOPRESSOR Take 0.5 tablets (12.5 mg total) by mouth 2 (two) times daily.   Myrbetriq 25 MG Tb24 tablet Generic drug: mirabegron ER TAKE 1 TABLET(25 MG) BY MOUTH DAILY What changed: See the new instructions.   nitroGLYCERIN 0.4 MG SL tablet Commonly known as: Nitrostat Place 1 tablet (0.4 mg total) under the tongue every 5 (five) minutes as needed for chest pain.   polyethylene glycol 17 g packet Commonly known as: MiraLax Take 17 g by mouth daily as needed for mild constipation.   Rhopressa 0.02 % Soln Generic drug: Netarsudil Dimesylate Place 1 drop into both eyes every evening.   senna-docusate 8.6-50 MG tablet Commonly known as: Senokot-S Take 2 tablets by mouth 2 (two) times daily.   sertraline 50 MG tablet Commonly known as: ZOLOFT Take 1 tablet (50 mg total) by mouth at bedtime.        Contact information for after-discharge care     Destination     [facility] .   Service: Skilled Nursing Contact information: [address] [facility] [address] Address [place]  [address] [phone]                    Allergies  Allergen Reactions   Definity [Perflutren Lipid Microsphere] Other (See Comments)    Patient experienced back pain with injection   Pletal [Cilostazol] Other (See Comments)    Made pt "feel funny"    Consultations: Palliative care consultation requested: Pending   Procedures/Studies: ECHOCARDIOGRAM COMPLETE  Result Date: 11/25/2022    ECHOCARDIOGRAM REPORT   Patient Name:   Yvonne Bailey Date of Exam: 11/25/2022 Medical Rec #:  [id number]           Height:       67.0 in Accession #:    [id number]          Weight:       176.1 lb Date of Birth:  28-Mar-1940            BSA:          1.916 m Patient Age:    82 years            BP:           157/95 mmHg Patient Gender: F                   HR:           70 bpm. Exam Location:  Inpatient Procedure:  4.60 cm      LV e' medial:    5.15 cm/s LV PW:         1.40 cm      LV E/e' medial:  30.9 LV IVS:        0.70 cm      LV e' lateral:   6.25 cm/s LVOT diam:     1.50 cm      LV E/e' lateral: 25.4 LVOT Area:     1.77 cm  LV Volumes (MOD) LV vol d, MOD A2C: 99.3 ml LV vol d, MOD A4C: 112.0 ml LV vol s, MOD A2C: 70.4 ml LV vol s, MOD A4C: 71.6 ml LV SV MOD A2C:     28.9 ml LV SV MOD A4C:     112.0 ml LV SV MOD BP:      34.0 ml RIGHT VENTRICLE RV S prime:     10.30 cm/s TAPSE (M-mode): 0.9 cm LEFT ATRIUM           Index        RIGHT ATRIUM           Index LA diam:      4.20 cm 2.19 cm/m   RA Area:     14.30 cm LA Vol (A2C): 30.1 ml 15.71 ml/m  RA Volume:   33.90 ml  17.69 ml/m LA Vol (A4C): 97.6 ml 50.94 ml/m   AORTA Ao Root diam: 2.90 cm Ao Asc diam:  3.10 cm MITRAL VALVE                 TRICUSPID VALVE MV Area (PHT): 4.15 cm      TR Peak grad:   27.7 mmHg MV Decel Time: 183 msec      TR Vmax:        263.00 cm/s MR Peak grad:   150.8 mmHg MR Mean grad:   95.0 mmHg    SHUNTS MR Vmax:        614.00 cm/s  Systemic Diam: 1.50 cm MR Vmean:       460.0 cm/s MR PISA:        0.57 cm MR PISA Radius: 0.30 cm MV E velocity: 159.00 cm/s MV A velocity: 43.10 cm/s MV E/A ratio:  3.69 Olga Millers MD Electronically signed by Olga Millers MD Signature Date/Time: 11/25/2022/3:29:01 PM    Final    DG Swallowing Func-Speech Pathology  Result Date: 11/25/2022 Table formatting  from the original result was not included. Modified Barium Swallow Study Patient Details Name: [name] MRN: [id number] Date of Birth: 1941/03/02 Today's Date: 11/25/2022 HPI/PMH: HPI: Yvonne Bailey is an 82 yo female presenting to ED 9/14 with AMS after a ground level fall. Found to have UTI with subsequent delirium and encephalopathy. CT Chest Abdomen Pelvis with moderate to large R and small L pleural effusions with associated atelectasis as well as moderate patchy bilateral apical ground-glass opacities which may be representative of PNA. CXR pending. Pt seen most recently by SLP for cognition 06/2022, but prior to that was seen for swallowing in 2018 without s/s of dysphagia or aspiration. 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 Oral phase inconsistently prolonged and swallow initiation inconsistently initiated at the level of the pyriform sinuses, suspected secondary to decreased cognition, but with full airway protection across consistencies. Appearance of a possible osteophyte at C5-6 causes a  100 MG capsule Commonly known as: Macrobid        TAKE these medications    aspirin EC 81 MG tablet Take 81 mg by mouth daily. Swallow whole.   atorvastatin 40 MG tablet Commonly known as: LIPITOR Take 2 tablets (80 mg total) by mouth daily.   dorzolamide-timolol 2-0.5 % ophthalmic solution Commonly known as: COSOPT Place 1 drop into both eyes in the morning, at noon, and at bedtime.   Eliquis 5 MG Tabs tablet Generic drug: apixaban TAKE 1 TABLET(5 MG) BY MOUTH TWICE DAILY   furosemide 20 MG tablet Commonly known as: LASIX Take 1 tablet (20 mg total) by mouth daily. Start taking on: November 28, 2022   levothyroxine 100 MCG tablet Commonly known as: SYNTHROID Take 100 mcg by mouth daily before breakfast.   losartan 25 MG tablet Commonly known as: COZAAR Take 1 tablet (25 mg total) by mouth daily. Start taking on: November 28, 2022   metoprolol tartrate 25 MG tablet Commonly known as: LOPRESSOR Take 0.5 tablets (12.5 mg total) by mouth 2 (two) times daily.   Myrbetriq 25 MG Tb24 tablet Generic drug: mirabegron ER TAKE 1 TABLET(25 MG) BY MOUTH DAILY What changed: See the new instructions.   nitroGLYCERIN 0.4 MG SL tablet Commonly known as: Nitrostat Place 1 tablet (0.4 mg total) under the tongue every 5 (five) minutes as needed for chest pain.   polyethylene glycol 17 g packet Commonly known as: MiraLax Take 17 g by mouth daily as needed for mild constipation.   Rhopressa 0.02 % Soln Generic drug: Netarsudil Dimesylate Place 1 drop into both eyes every evening.   senna-docusate 8.6-50 MG tablet Commonly known as: Senokot-S Take 2 tablets by mouth 2 (two) times daily.   sertraline 50 MG tablet Commonly known as: ZOLOFT Take 1 tablet (50 mg total) by mouth at bedtime.        Contact information for after-discharge care     Destination     [facility] .   Service: Skilled Nursing Contact information: [address] [facility] [address] Address [place]  [address] [phone]                    Allergies  Allergen Reactions   Definity [Perflutren Lipid Microsphere] Other (See Comments)    Patient experienced back pain with injection   Pletal [Cilostazol] Other (See Comments)    Made pt "feel funny"    Consultations: Palliative care consultation requested: Pending   Procedures/Studies: ECHOCARDIOGRAM COMPLETE  Result Date: 11/25/2022    ECHOCARDIOGRAM REPORT   Patient Name:   Yvonne Bailey Date of Exam: 11/25/2022 Medical Rec #:  [id number]           Height:       67.0 in Accession #:    [id number]          Weight:       176.1 lb Date of Birth:  28-Mar-1940            BSA:          1.916 m Patient Age:    82 years            BP:           157/95 mmHg Patient Gender: F                   HR:           70 bpm. Exam Location:  Inpatient Procedure:  100 MG capsule Commonly known as: Macrobid        TAKE these medications    aspirin EC 81 MG tablet Take 81 mg by mouth daily. Swallow whole.   atorvastatin 40 MG tablet Commonly known as: LIPITOR Take 2 tablets (80 mg total) by mouth daily.   dorzolamide-timolol 2-0.5 % ophthalmic solution Commonly known as: COSOPT Place 1 drop into both eyes in the morning, at noon, and at bedtime.   Eliquis 5 MG Tabs tablet Generic drug: apixaban TAKE 1 TABLET(5 MG) BY MOUTH TWICE DAILY   furosemide 20 MG tablet Commonly known as: LASIX Take 1 tablet (20 mg total) by mouth daily. Start taking on: November 28, 2022   levothyroxine 100 MCG tablet Commonly known as: SYNTHROID Take 100 mcg by mouth daily before breakfast.   losartan 25 MG tablet Commonly known as: COZAAR Take 1 tablet (25 mg total) by mouth daily. Start taking on: November 28, 2022   metoprolol tartrate 25 MG tablet Commonly known as: LOPRESSOR Take 0.5 tablets (12.5 mg total) by mouth 2 (two) times daily.   Myrbetriq 25 MG Tb24 tablet Generic drug: mirabegron ER TAKE 1 TABLET(25 MG) BY MOUTH DAILY What changed: See the new instructions.   nitroGLYCERIN 0.4 MG SL tablet Commonly known as: Nitrostat Place 1 tablet (0.4 mg total) under the tongue every 5 (five) minutes as needed for chest pain.   polyethylene glycol 17 g packet Commonly known as: MiraLax Take 17 g by mouth daily as needed for mild constipation.   Rhopressa 0.02 % Soln Generic drug: Netarsudil Dimesylate Place 1 drop into both eyes every evening.   senna-docusate 8.6-50 MG tablet Commonly known as: Senokot-S Take 2 tablets by mouth 2 (two) times daily.   sertraline 50 MG tablet Commonly known as: ZOLOFT Take 1 tablet (50 mg total) by mouth at bedtime.        Contact information for after-discharge care     Destination     [facility] .   Service: Skilled Nursing Contact information: [address] [facility] [address] Address [place]  [address] [phone]                    Allergies  Allergen Reactions   Definity [Perflutren Lipid Microsphere] Other (See Comments)    Patient experienced back pain with injection   Pletal [Cilostazol] Other (See Comments)    Made pt "feel funny"    Consultations: Palliative care consultation requested: Pending   Procedures/Studies: ECHOCARDIOGRAM COMPLETE  Result Date: 11/25/2022    ECHOCARDIOGRAM REPORT   Patient Name:   Yvonne Bailey Date of Exam: 11/25/2022 Medical Rec #:  [id number]           Height:       67.0 in Accession #:    [id number]          Weight:       176.1 lb Date of Birth:  28-Mar-1940            BSA:          1.916 m Patient Age:    82 years            BP:           157/95 mmHg Patient Gender: F                   HR:           70 bpm. Exam Location:  Inpatient Procedure:  Physician Discharge Summary  [name] JSE:[id number] DOB: 14-Oct-1940 DOA: 11/23/2022  PCP: Pincus Sanes, MD  Admit date: 11/23/2022 Discharge date: 11/27/2022  Admitted From: Home Disposition: SNF  Recommendations for Outpatient Follow-up:  Follow up with SNF provider at earliest convenience Outpatient follow-up with cardiology and palliative care Follow up in ED if symptoms worsen or new appear   Home Health: No Equipment/Devices: None  Discharge Condition: Guarded CODE STATUS: Full Diet recommendation: Heart healthy/diet as per SLP recommendations  Brief/Interim Summary: 82 y.o. female with past medical history significant for dementia, CVA, CAD, chronic systolic congestive heart failure, atrial fibrillation on Eliquis, CKD stage IIIa, hypothyroidism, HTN, HLD, prediabetes presented with worsening confusion and ground-level fall.  She was found to have acute metabolic encephalopathy from possible UTI and pneumonia and was treated with IV antibiotics.  She was also found to have moderate to large right pleural effusion and stercoral colitis.  She received Fleet enema.  During the hospitalization, her condition is improved.  Blood cultures have been negative so far.  Urine cultures showed less than 10,000 colonies of insignificant growth.  Today is day 5 of antibiotics.  She will not need any more antibiotics.  PT recommended SNF placement.  Overall prognosis is poor.  Palliative care evaluation requested: This can happen as an outpatient.  Echo showed EF of 30 to 35%: Started on losartan, Lasix and metoprolol.  Discharge patient to SNF today.    Discharge Diagnoses:   Acute metabolic encephalopathy Delirium -Possibly from UTI and pneumonia.  Resolved. -Currently mental status is possibly at baseline: Patient has dementia  Community-acquired pneumonia -Treated with IV antibiotics.  Blood cultures negative so far.  Today is day 5 of antibiotic.  No need for antibiotics on  discharge  History of recent UTI -Was treated with Macrobid as an outpatient. Urine cultures showed less than 10,000 colonies of insignificant growth.   Constipation/stercoral colitis -Underwent disimpaction in the ED.  Continue Senokot along with as needed MiraLAX.  Chronic systolic heart failure, compensated Moderate right pleural effusion Elevated troponins: Possibly from demand ischemia; troponins remain flat with no concerning dynamic EKG changes -Started on metoprolol, losartan, Lasix during this hospitalization.  Echo showed EF of 30 to 35%.  Continue diet and fluid restriction.  Outpatient follow-up with cardiology -Patient is currently on room air with no respiratory distress.  Dysphagia -Continue dysphagia diet as per SLP recommendations.  Paroxysmal A-fib Essential hypertension Hyperlipidemia -Currently rate controlled.  Continue Eliquis, metoprolol.  Continue losartan and Lasix. -Continue statin  Depression -Continue sertraline  CKD stage IIIa Currently at baseline.  Outpatient follow-up  Hypothyroidism -Continue levothyroxine  Physical deconditioning Ground-level fall -Imaging during the hospitalization was unrevealing with no fracture/dislocation. -Needs SNF placement as per PT.  Discharge Instructions  Discharge Instructions     Amb Referral to Palliative Care   Complete by: As directed    Ambulatory referral to Cardiology   Complete by: As directed    Diet - low sodium heart healthy   Complete by: As directed    Increase activity slowly   Complete by: As directed       Allergies as of 11/27/2022       Reactions   Definity [perflutren Lipid Microsphere] Other (See Comments)   Patient experienced back pain with injection   Pletal [cilostazol] Other (See Comments)   Made pt "feel funny"        Medication List     STOP taking these medications    nitrofurantoin (macrocrystal-monohydrate)  4.60 cm      LV e' medial:    5.15 cm/s LV PW:         1.40 cm      LV E/e' medial:  30.9 LV IVS:        0.70 cm      LV e' lateral:   6.25 cm/s LVOT diam:     1.50 cm      LV E/e' lateral: 25.4 LVOT Area:     1.77 cm  LV Volumes (MOD) LV vol d, MOD A2C: 99.3 ml LV vol d, MOD A4C: 112.0 ml LV vol s, MOD A2C: 70.4 ml LV vol s, MOD A4C: 71.6 ml LV SV MOD A2C:     28.9 ml LV SV MOD A4C:     112.0 ml LV SV MOD BP:      34.0 ml RIGHT VENTRICLE RV S prime:     10.30 cm/s TAPSE (M-mode): 0.9 cm LEFT ATRIUM           Index        RIGHT ATRIUM           Index LA diam:      4.20 cm 2.19 cm/m   RA Area:     14.30 cm LA Vol (A2C): 30.1 ml 15.71 ml/m  RA Volume:   33.90 ml  17.69 ml/m LA Vol (A4C): 97.6 ml 50.94 ml/m   AORTA Ao Root diam: 2.90 cm Ao Asc diam:  3.10 cm MITRAL VALVE                 TRICUSPID VALVE MV Area (PHT): 4.15 cm      TR Peak grad:   27.7 mmHg MV Decel Time: 183 msec      TR Vmax:        263.00 cm/s MR Peak grad:   150.8 mmHg MR Mean grad:   95.0 mmHg    SHUNTS MR Vmax:        614.00 cm/s  Systemic Diam: 1.50 cm MR Vmean:       460.0 cm/s MR PISA:        0.57 cm MR PISA Radius: 0.30 cm MV E velocity: 159.00 cm/s MV A velocity: 43.10 cm/s MV E/A ratio:  3.69 Olga Millers MD Electronically signed by Olga Millers MD Signature Date/Time: 11/25/2022/3:29:01 PM    Final    DG Swallowing Func-Speech Pathology  Result Date: 11/25/2022 Table formatting  from the original result was not included. Modified Barium Swallow Study Patient Details Name: [name] MRN: [id number] Date of Birth: 1941/03/02 Today's Date: 11/25/2022 HPI/PMH: HPI: Yvonne Bailey is an 82 yo female presenting to ED 9/14 with AMS after a ground level fall. Found to have UTI with subsequent delirium and encephalopathy. CT Chest Abdomen Pelvis with moderate to large R and small L pleural effusions with associated atelectasis as well as moderate patchy bilateral apical ground-glass opacities which may be representative of PNA. CXR pending. Pt seen most recently by SLP for cognition 06/2022, but prior to that was seen for swallowing in 2018 without s/s of dysphagia or aspiration. 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 Oral phase inconsistently prolonged and swallow initiation inconsistently initiated at the level of the pyriform sinuses, suspected secondary to decreased cognition, but with full airway protection across consistencies. Appearance of a possible osteophyte at C5-6 causes a  100 MG capsule Commonly known as: Macrobid        TAKE these medications    aspirin EC 81 MG tablet Take 81 mg by mouth daily. Swallow whole.   atorvastatin 40 MG tablet Commonly known as: LIPITOR Take 2 tablets (80 mg total) by mouth daily.   dorzolamide-timolol 2-0.5 % ophthalmic solution Commonly known as: COSOPT Place 1 drop into both eyes in the morning, at noon, and at bedtime.   Eliquis 5 MG Tabs tablet Generic drug: apixaban TAKE 1 TABLET(5 MG) BY MOUTH TWICE DAILY   furosemide 20 MG tablet Commonly known as: LASIX Take 1 tablet (20 mg total) by mouth daily. Start taking on: November 28, 2022   levothyroxine 100 MCG tablet Commonly known as: SYNTHROID Take 100 mcg by mouth daily before breakfast.   losartan 25 MG tablet Commonly known as: COZAAR Take 1 tablet (25 mg total) by mouth daily. Start taking on: November 28, 2022   metoprolol tartrate 25 MG tablet Commonly known as: LOPRESSOR Take 0.5 tablets (12.5 mg total) by mouth 2 (two) times daily.   Myrbetriq 25 MG Tb24 tablet Generic drug: mirabegron ER TAKE 1 TABLET(25 MG) BY MOUTH DAILY What changed: See the new instructions.   nitroGLYCERIN 0.4 MG SL tablet Commonly known as: Nitrostat Place 1 tablet (0.4 mg total) under the tongue every 5 (five) minutes as needed for chest pain.   polyethylene glycol 17 g packet Commonly known as: MiraLax Take 17 g by mouth daily as needed for mild constipation.   Rhopressa 0.02 % Soln Generic drug: Netarsudil Dimesylate Place 1 drop into both eyes every evening.   senna-docusate 8.6-50 MG tablet Commonly known as: Senokot-S Take 2 tablets by mouth 2 (two) times daily.   sertraline 50 MG tablet Commonly known as: ZOLOFT Take 1 tablet (50 mg total) by mouth at bedtime.        Contact information for after-discharge care     Destination     [facility]erred [facility] .   Service: Skilled Nursing Contact information: [address] [facility] [address] Address [place]  [address] [phone]                    Allergies  Allergen Reactions   Definity [Perflutren Lipid Microsphere] Other (See Comments)    Patient experienced back pain with injection   Pletal [Cilostazol] Other (See Comments)    Made pt "feel funny"    Consultations: Palliative care consultation requested: Pending   Procedures/Studies: ECHOCARDIOGRAM COMPLETE  Result Date: 11/25/2022    ECHOCARDIOGRAM REPORT   Patient Name:   Yvonne Bailey Date of Exam: 11/25/2022 Medical Rec #:  [id number]           Height:       67.0 in Accession #:    [id number]          Weight:       176.1 lb Date of Birth:  28-Mar-1940            BSA:          1.916 m Patient Age:    82 years            BP:           157/95 mmHg Patient Gender: F                   HR:           70 bpm. Exam Location:  Inpatient Procedure:  Physician Discharge Summary  [name] JSE:[id number] DOB: 14-Oct-1940 DOA: 11/23/2022  PCP: Pincus Sanes, MD  Admit date: 11/23/2022 Discharge date: 11/27/2022  Admitted From: Home Disposition: SNF  Recommendations for Outpatient Follow-up:  Follow up with SNF provider at earliest convenience Outpatient follow-up with cardiology and palliative care Follow up in ED if symptoms worsen or new appear   Home Health: No Equipment/Devices: None  Discharge Condition: Guarded CODE STATUS: Full Diet recommendation: Heart healthy/diet as per SLP recommendations  Brief/Interim Summary: 82 y.o. female with past medical history significant for dementia, CVA, CAD, chronic systolic congestive heart failure, atrial fibrillation on Eliquis, CKD stage IIIa, hypothyroidism, HTN, HLD, prediabetes presented with worsening confusion and ground-level fall.  She was found to have acute metabolic encephalopathy from possible UTI and pneumonia and was treated with IV antibiotics.  She was also found to have moderate to large right pleural effusion and stercoral colitis.  She received Fleet enema.  During the hospitalization, her condition is improved.  Blood cultures have been negative so far.  Urine cultures showed less than 10,000 colonies of insignificant growth.  Today is day 5 of antibiotics.  She will not need any more antibiotics.  PT recommended SNF placement.  Overall prognosis is poor.  Palliative care evaluation requested: This can happen as an outpatient.  Echo showed EF of 30 to 35%: Started on losartan, Lasix and metoprolol.  Discharge patient to SNF today.    Discharge Diagnoses:   Acute metabolic encephalopathy Delirium -Possibly from UTI and pneumonia.  Resolved. -Currently mental status is possibly at baseline: Patient has dementia  Community-acquired pneumonia -Treated with IV antibiotics.  Blood cultures negative so far.  Today is day 5 of antibiotic.  No need for antibiotics on  discharge  History of recent UTI -Was treated with Macrobid as an outpatient. Urine cultures showed less than 10,000 colonies of insignificant growth.   Constipation/stercoral colitis -Underwent disimpaction in the ED.  Continue Senokot along with as needed MiraLAX.  Chronic systolic heart failure, compensated Moderate right pleural effusion Elevated troponins: Possibly from demand ischemia; troponins remain flat with no concerning dynamic EKG changes -Started on metoprolol, losartan, Lasix during this hospitalization.  Echo showed EF of 30 to 35%.  Continue diet and fluid restriction.  Outpatient follow-up with cardiology -Patient is currently on room air with no respiratory distress.  Dysphagia -Continue dysphagia diet as per SLP recommendations.  Paroxysmal A-fib Essential hypertension Hyperlipidemia -Currently rate controlled.  Continue Eliquis, metoprolol.  Continue losartan and Lasix. -Continue statin  Depression -Continue sertraline  CKD stage IIIa Currently at baseline.  Outpatient follow-up  Hypothyroidism -Continue levothyroxine  Physical deconditioning Ground-level fall -Imaging during the hospitalization was unrevealing with no fracture/dislocation. -Needs SNF placement as per PT.  Discharge Instructions  Discharge Instructions     Amb Referral to Palliative Care   Complete by: As directed    Ambulatory referral to Cardiology   Complete by: As directed    Diet - low sodium heart healthy   Complete by: As directed    Increase activity slowly   Complete by: As directed       Allergies as of 11/27/2022       Reactions   Definity [perflutren Lipid Microsphere] Other (See Comments)   Patient experienced back pain with injection   Pletal [cilostazol] Other (See Comments)   Made pt "feel funny"        Medication List     STOP taking these medications    nitrofurantoin (macrocrystal-monohydrate)  4.60 cm      LV e' medial:    5.15 cm/s LV PW:         1.40 cm      LV E/e' medial:  30.9 LV IVS:        0.70 cm      LV e' lateral:   6.25 cm/s LVOT diam:     1.50 cm      LV E/e' lateral: 25.4 LVOT Area:     1.77 cm  LV Volumes (MOD) LV vol d, MOD A2C: 99.3 ml LV vol d, MOD A4C: 112.0 ml LV vol s, MOD A2C: 70.4 ml LV vol s, MOD A4C: 71.6 ml LV SV MOD A2C:     28.9 ml LV SV MOD A4C:     112.0 ml LV SV MOD BP:      34.0 ml RIGHT VENTRICLE RV S prime:     10.30 cm/s TAPSE (M-mode): 0.9 cm LEFT ATRIUM           Index        RIGHT ATRIUM           Index LA diam:      4.20 cm 2.19 cm/m   RA Area:     14.30 cm LA Vol (A2C): 30.1 ml 15.71 ml/m  RA Volume:   33.90 ml  17.69 ml/m LA Vol (A4C): 97.6 ml 50.94 ml/m   AORTA Ao Root diam: 2.90 cm Ao Asc diam:  3.10 cm MITRAL VALVE                 TRICUSPID VALVE MV Area (PHT): 4.15 cm      TR Peak grad:   27.7 mmHg MV Decel Time: 183 msec      TR Vmax:        263.00 cm/s MR Peak grad:   150.8 mmHg MR Mean grad:   95.0 mmHg    SHUNTS MR Vmax:        614.00 cm/s  Systemic Diam: 1.50 cm MR Vmean:       460.0 cm/s MR PISA:        0.57 cm MR PISA Radius: 0.30 cm MV E velocity: 159.00 cm/s MV A velocity: 43.10 cm/s MV E/A ratio:  3.69 Olga Millers MD Electronically signed by Olga Millers MD Signature Date/Time: 11/25/2022/3:29:01 PM    Final    DG Swallowing Func-Speech Pathology  Result Date: 11/25/2022 Table formatting  from the original result was not included. Modified Barium Swallow Study Patient Details Name: [name] MRN: [id number] Date of Birth: 1941/03/02 Today's Date: 11/25/2022 HPI/PMH: HPI: Yvonne Bailey is an 82 yo female presenting to ED 9/14 with AMS after a ground level fall. Found to have UTI with subsequent delirium and encephalopathy. CT Chest Abdomen Pelvis with moderate to large R and small L pleural effusions with associated atelectasis as well as moderate patchy bilateral apical ground-glass opacities which may be representative of PNA. CXR pending. Pt seen most recently by SLP for cognition 06/2022, but prior to that was seen for swallowing in 2018 without s/s of dysphagia or aspiration. 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 Oral phase inconsistently prolonged and swallow initiation inconsistently initiated at the level of the pyriform sinuses, suspected secondary to decreased cognition, but with full airway protection across consistencies. Appearance of a possible osteophyte at C5-6 causes a  Physician Discharge Summary  [name] JSE:[id number] DOB: 14-Oct-1940 DOA: 11/23/2022  PCP: Pincus Sanes, MD  Admit date: 11/23/2022 Discharge date: 11/27/2022  Admitted From: Home Disposition: SNF  Recommendations for Outpatient Follow-up:  Follow up with SNF provider at earliest convenience Outpatient follow-up with cardiology and palliative care Follow up in ED if symptoms worsen or new appear   Home Health: No Equipment/Devices: None  Discharge Condition: Guarded CODE STATUS: Full Diet recommendation: Heart healthy/diet as per SLP recommendations  Brief/Interim Summary: 82 y.o. female with past medical history significant for dementia, CVA, CAD, chronic systolic congestive heart failure, atrial fibrillation on Eliquis, CKD stage IIIa, hypothyroidism, HTN, HLD, prediabetes presented with worsening confusion and ground-level fall.  She was found to have acute metabolic encephalopathy from possible UTI and pneumonia and was treated with IV antibiotics.  She was also found to have moderate to large right pleural effusion and stercoral colitis.  She received Fleet enema.  During the hospitalization, her condition is improved.  Blood cultures have been negative so far.  Urine cultures showed less than 10,000 colonies of insignificant growth.  Today is day 5 of antibiotics.  She will not need any more antibiotics.  PT recommended SNF placement.  Overall prognosis is poor.  Palliative care evaluation requested: This can happen as an outpatient.  Echo showed EF of 30 to 35%: Started on losartan, Lasix and metoprolol.  Discharge patient to SNF today.    Discharge Diagnoses:   Acute metabolic encephalopathy Delirium -Possibly from UTI and pneumonia.  Resolved. -Currently mental status is possibly at baseline: Patient has dementia  Community-acquired pneumonia -Treated with IV antibiotics.  Blood cultures negative so far.  Today is day 5 of antibiotic.  No need for antibiotics on  discharge  History of recent UTI -Was treated with Macrobid as an outpatient. Urine cultures showed less than 10,000 colonies of insignificant growth.   Constipation/stercoral colitis -Underwent disimpaction in the ED.  Continue Senokot along with as needed MiraLAX.  Chronic systolic heart failure, compensated Moderate right pleural effusion Elevated troponins: Possibly from demand ischemia; troponins remain flat with no concerning dynamic EKG changes -Started on metoprolol, losartan, Lasix during this hospitalization.  Echo showed EF of 30 to 35%.  Continue diet and fluid restriction.  Outpatient follow-up with cardiology -Patient is currently on room air with no respiratory distress.  Dysphagia -Continue dysphagia diet as per SLP recommendations.  Paroxysmal A-fib Essential hypertension Hyperlipidemia -Currently rate controlled.  Continue Eliquis, metoprolol.  Continue losartan and Lasix. -Continue statin  Depression -Continue sertraline  CKD stage IIIa Currently at baseline.  Outpatient follow-up  Hypothyroidism -Continue levothyroxine  Physical deconditioning Ground-level fall -Imaging during the hospitalization was unrevealing with no fracture/dislocation. -Needs SNF placement as per PT.  Discharge Instructions  Discharge Instructions     Amb Referral to Palliative Care   Complete by: As directed    Ambulatory referral to Cardiology   Complete by: As directed    Diet - low sodium heart healthy   Complete by: As directed    Increase activity slowly   Complete by: As directed       Allergies as of 11/27/2022       Reactions   Definity [perflutren Lipid Microsphere] Other (See Comments)   Patient experienced back pain with injection   Pletal [cilostazol] Other (See Comments)   Made pt "feel funny"        Medication List     STOP taking these medications    nitrofurantoin (macrocrystal-monohydrate)

## 2022-11-27 NOTE — TOC Transition Note (Signed)
Transition of Care [facility]) - CM/SW Discharge Note   Patient Details  Name: Yvonne Bailey MRN: [id number] Date of Birth: 1940/08/24  Transition of Care [facility]) CM/SW Contact:  Erin Sons, LCSW Phone Number: 11/27/2022, 11:35 AM   Clinical Narrative:     Auth approved 9/18-- 9/20 [id number]   Per MD patient ready for DC to [facility]. RN, patient, patient's family, and facility notified of DC. Discharge Summary and FL2 sent to facility. RN to call report prior to discharge [phone]). DC packet on chart. Ambulance transport requested for patient.   CSW will sign off for now as social work intervention is no longer needed. Please consult [place] again if new needs arise.     Barriers to Discharge: No Barriers Identified   Patient Goals and CMS Choice      Discharge Placement                Patient chooses bed at: [facility] Patient to be transferred to facility by: [facility] Name of family member notified: Grandaughter Patient and family notified of of transfer: 11/27/22  Discharge Plan and Services Additional resources added to the After Visit Summary for   In-house Referral: Clinical Social Work   Post Acute Care Choice: Skilled Nursing Facility                               Social Determinants of Health (SDOH) Interventions SDOH Screenings   Food Insecurity: No Food Insecurity (11/24/2022)  Housing: Low Risk  (11/24/2022)  Transportation Needs: No Transportation Needs (11/24/2022)  Utilities: Patient Unable To Answer (11/24/2022)  Alcohol Screen: Low Risk  (12/17/2021)  Depression (PHQ2-9): Low Risk  (03/18/2022)  Financial Resource Strain: Low Risk  (12/17/2021)  Physical Activity: Inactive (12/17/2021)  Social Connections: Patient Declined (12/17/2021)  Stress: No Stress Concern Present (12/17/2021)  Tobacco Use: Medium Risk (11/23/2022)     Readmission Risk Interventions    11/19/2021   10:32 AM  Readmission Risk Prevention Plan  Transportation  Screening Complete  PCP or Specialist Appt within 5-7 Days Complete  Home Care Screening Complete  Medication Review (RN CM) Complete

## 2022-11-27 NOTE — Consult Note (Signed)
Palliative Medicine Inpatient Consult Note  Consulting Provider:  Glade Lloyd, MD   Reason for consult:   Palliative Care Consult Services Palliative Medicine Consult  Reason for Consult? Goals of care/Dementia, dysphagia, debility, FTT   11/27/2022  HPI:  Per intake H&P --> 82 y.o. female with past medical history significant for dementia, CVA, CAD, chronic systolic congestive heart failure, atrial fibrillation on Eliquis, CKD stage IIIa, hypothyroidism, HTN, HLD, prediabetes presented with worsening confusion and ground-level fall.  She was found to have acute metabolic encephalopathy from possible UTI and pneumonia and was treated with IV antibiotics.   The palliative care team has been asked to get involved to further address goals of care conversation.  Clinical Assessment/Goals of Care:  *Please note that this is a verbal dictation therefore any spelling or grammatical errors are due to the "Dragon Medical One" system interpretation.  I have reviewed medical records including EPIC notes, labs and imaging, received report from bedside RN, assessed the patient who is resting comfortably in bed in no acute distress.    I called patient's son, Yvonne Bailey to further discuss diagnosis prognosis, GOC, EOL wishes, disposition and options.   I introduced Palliative Medicine as specialized medical care for people living with serious illness. It focuses on providing relief from the symptoms and stress of a serious illness. The goal is to improve quality of life for both the patient and the family.  Medical History Review and Understanding:  A review of Yvonne Bailey's past medical history inclusive of her dementia, prior stroke, coronary artery disease, congestive heart failure, A-fib, chronic kidney disease, hypothyroidism, hypertension, and prediabetes was held.  Social History:  Yvonne Bailey is from [facility].  She is a widow and has 1 son, Yvonne Bailey.  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  She is a woman of faith and practices within Christianity  Functional and Nutritional State:  Given hospitalization Yvonne Bailey did have a fairly good appetite though at times for She would stop and cough then carry on eating.  She was able to mobilize with a front wheel walker.  Her son did help her with getting into the shower and setting things up for her to clean herself.  She does have a CNA in the evenings who continues to help with bADLs.   Yvonne Bailey does attend daycare every day during the week.  Advance Directives:  A detailed discussion was had today regarding advanced directives.  Patient does have advanced directives which Yvonne Bailey shares he is able to provide a copy of.   Code Status:  Concepts specific to code status, artifical feeding and hydration, continued IV antibiotics and rehospitalization was had.  The difference between a aggressive medical intervention path  and a palliative comfort care path for this patient at this time was had.   Encouraged patient/family to consider DNR/DNI status understanding evidenced based poor outcomes in similar hospitalized patient, as the cause of arrest is likely associated with advanced chronic/terminal illness rather than an easily reversible acute cardio-pulmonary event. I explained that DNR/DNI does not change the medical plan and it only comes into effect after a person has arrested (died).  It is a protective measure to keep [place] from harming the patient in their last moments of life.   Yvonne Bailey feels that this mother in the past had wanted resuscitative efforts.  We talked about this in greater detail and the long-term burdens of such efforts in someone who has 2 or more chronic comorbid conditions and  is elderly.  Have emailed a copy of hard choices for loving people booklet to patient's son Yvonne Bailey.  Discussion:  We discussed Yvonne Bailey's present health state and how at home she is identified as a very  vibrant woman.  Majority of our conversation focused on dementia the various types of dementia and how dementia can present over time inclusive of patient's 2 of the inability to coordinate swallow appropriately.  We talked further about dysphagia and what to do if aspiration global events occur frequently and recurrently.  At this time Yvonne Bailey's goals are for her to be treated fully for her infections both lung and urine and for her to get back home maintaining her daily routine.  Discussed the importance of continued conversation with family and their  medical providers regarding overall plan of care and treatment options, ensuring decisions are within the context of the patients values and GOCs.  Decision Maker: Yvonne Bailey,Yvonne Bailey (Son): [phone] (Mobile)   SUMMARY OF RECOMMENDATIONS   Full code/full scope of care --> patient's son Yvonne Bailey is going to review the hard choices for loving people book with and is aware full resuscitative efforts may not result in long-term benefit for Yvonne Bailey and honest conversations held in the setting of dementia, what to expect with its advancement, and dysphagia leading to possible aspirational events  Plan for outpatient palliative support on discharge  PMT will continue to follow otherwise plan for discharge home  Code Status/Advance Care Planning: FULL CODE   Palliative Prophylaxis:  Aspiration, Bowel Regimen, Delirium Protocol, Frequent Pain Assessment, Oral Care, Palliative Wound Care, and Turn Reposition  Additional Recommendations (Limitations, Scope, Preferences): Continue current care  Psycho-social/Spiritual:  Desire for further Chaplaincy support: Not presently Additional Recommendations: Education on dementia  Prognosis: Recurrent rehospitalization's, increase chronic disease burden, increased 33-month mortality risk  Discharge Planning: Discharge home once medically optimized  Vitals:   11/27/22 0443 11/27/22 0848  BP: 136/70  132/61  Pulse: 62 64  Resp: 20 18  Temp: 98.3 F (36.8 C) 98.3 F (36.8 C)  SpO2: 99% 99%    Intake/Output Summary (Last 24 hours) at 11/27/2022 1123 Last data filed at 11/27/2022 1308 Gross per 24 hour  Intake 794 ml  Output 800 ml  Net -6 ml   Last Weight  Most recent update: 11/24/2022  7:00 AM    Weight  79.9 kg (176 lb 2.4 oz)            Gen: Elderly African-American female chronically ill in appearance HEENT: moist mucous membranes CV: Regular rate and irregular rhythm  PULM: On room air breathing is even and nonlabored ABD: soft/nontender EXT: Generalized anasarca Neuro: Alert to self  PPS: 40%   This conversation/these recommendations were discussed with patient primary care team, Dr. Hanley Ben  Billing based on MDM: High  Problems Addressed: One acute or chronic illness or injury that poses a threat to life or bodily function  Amount and/or Complexity of Data: Category 3:Discussion of management or test interpretation with external physician/other qualified health care professional/appropriate source (not separately reported)  Risks: Decision regarding hospitalization or escalation of hospital care and Decision not to resuscitate or to de-escalate care because of poor prognosis ______________________________________________________ [address] Palliative Medicine Team Team Cell Phone: [phone] Please utilize secure chat with additional questions, if there is no response within 30 minutes please call the above phone number  Palliative Medicine Team providers are available by phone from 7am to 7pm daily and can be reached through the team  cell phone.  Should this patient require assistance outside of these hours, please call the patient's attending physician.

## 2022-11-27 NOTE — Care Management Important Message (Signed)
Important Message  Patient Details  Name: Yvonne Bailey MRN: [id number] Date of Birth: 08-26-1940   Medicare Important Message Given:  Yes     [name] 11/27/2022, 1:08 PM

## 2022-11-28 DIAGNOSIS — R531 Weakness: Secondary | ICD-10-CM | POA: Diagnosis not present

## 2022-11-28 DIAGNOSIS — I480 Paroxysmal atrial fibrillation: Secondary | ICD-10-CM | POA: Diagnosis not present

## 2022-11-28 DIAGNOSIS — N1831 Chronic kidney disease, stage 3a: Secondary | ICD-10-CM | POA: Diagnosis not present

## 2022-11-28 DIAGNOSIS — I10 Essential (primary) hypertension: Secondary | ICD-10-CM | POA: Diagnosis not present

## 2022-11-28 LAB — CULTURE, BLOOD (ROUTINE X 2)
Culture: NO GROWTH
Culture: NO GROWTH
Special Requests: ADEQUATE
Special Requests: ADEQUATE

## 2022-12-05 DIAGNOSIS — F03C11 Unspecified dementia, severe, with agitation: Secondary | ICD-10-CM | POA: Diagnosis not present

## 2022-12-05 DIAGNOSIS — Z515 Encounter for palliative care: Secondary | ICD-10-CM | POA: Diagnosis not present

## 2022-12-11 DIAGNOSIS — I10 Essential (primary) hypertension: Secondary | ICD-10-CM | POA: Diagnosis not present

## 2022-12-11 DIAGNOSIS — E785 Hyperlipidemia, unspecified: Secondary | ICD-10-CM | POA: Diagnosis not present

## 2022-12-11 DIAGNOSIS — E876 Hypokalemia: Secondary | ICD-10-CM | POA: Diagnosis not present

## 2022-12-11 DIAGNOSIS — E039 Hypothyroidism, unspecified: Secondary | ICD-10-CM | POA: Diagnosis not present

## 2022-12-12 ENCOUNTER — Emergency Department (HOSPITAL_COMMUNITY): Payer: Medicare HMO

## 2022-12-12 ENCOUNTER — Emergency Department (HOSPITAL_COMMUNITY)
Admission: EM | Admit: 2022-12-12 | Discharge: 2022-12-12 | Disposition: A | Discharge status: Home / Self Care | Payer: Medicare HMO | Attending: Emergency Medicine | Admitting: Emergency Medicine

## 2022-12-12 ENCOUNTER — Other Ambulatory Visit: Payer: Self-pay

## 2022-12-12 DIAGNOSIS — I10 Essential (primary) hypertension: Secondary | ICD-10-CM | POA: Diagnosis not present

## 2022-12-12 DIAGNOSIS — R531 Weakness: Secondary | ICD-10-CM | POA: Insufficient documentation

## 2022-12-12 DIAGNOSIS — Z7982 Long term (current) use of aspirin: Secondary | ICD-10-CM | POA: Diagnosis not present

## 2022-12-12 DIAGNOSIS — I480 Paroxysmal atrial fibrillation: Secondary | ICD-10-CM | POA: Diagnosis not present

## 2022-12-12 DIAGNOSIS — R4701 Aphasia: Secondary | ICD-10-CM | POA: Insufficient documentation

## 2022-12-12 DIAGNOSIS — I2510 Atherosclerotic heart disease of native coronary artery without angina pectoris: Secondary | ICD-10-CM | POA: Diagnosis not present

## 2022-12-12 DIAGNOSIS — E039 Hypothyroidism, unspecified: Secondary | ICD-10-CM | POA: Diagnosis not present

## 2022-12-12 DIAGNOSIS — Z79899 Other long term (current) drug therapy: Secondary | ICD-10-CM | POA: Insufficient documentation

## 2022-12-12 DIAGNOSIS — R2689 Other abnormalities of gait and mobility: Secondary | ICD-10-CM | POA: Diagnosis not present

## 2022-12-12 DIAGNOSIS — F4489 Other dissociative and conversion disorders: Secondary | ICD-10-CM | POA: Diagnosis not present

## 2022-12-12 DIAGNOSIS — I517 Cardiomegaly: Secondary | ICD-10-CM | POA: Diagnosis not present

## 2022-12-12 DIAGNOSIS — Z7901 Long term (current) use of anticoagulants: Secondary | ICD-10-CM | POA: Diagnosis not present

## 2022-12-12 DIAGNOSIS — J90 Pleural effusion, not elsewhere classified: Secondary | ICD-10-CM | POA: Diagnosis not present

## 2022-12-12 DIAGNOSIS — R918 Other nonspecific abnormal finding of lung field: Secondary | ICD-10-CM | POA: Diagnosis not present

## 2022-12-12 DIAGNOSIS — N3000 Acute cystitis without hematuria: Secondary | ICD-10-CM | POA: Diagnosis not present

## 2022-12-12 DIAGNOSIS — N1831 Chronic kidney disease, stage 3a: Secondary | ICD-10-CM | POA: Diagnosis not present

## 2022-12-12 DIAGNOSIS — R4182 Altered mental status, unspecified: Secondary | ICD-10-CM | POA: Diagnosis not present

## 2022-12-12 DIAGNOSIS — N183 Chronic kidney disease, stage 3 unspecified: Secondary | ICD-10-CM | POA: Diagnosis not present

## 2022-12-12 DIAGNOSIS — Z8673 Personal history of transient ischemic attack (TIA), and cerebral infarction without residual deficits: Secondary | ICD-10-CM | POA: Diagnosis not present

## 2022-12-12 DIAGNOSIS — I129 Hypertensive chronic kidney disease with stage 1 through stage 4 chronic kidney disease, or unspecified chronic kidney disease: Secondary | ICD-10-CM | POA: Insufficient documentation

## 2022-12-12 LAB — CBG MONITORING, ED: Glucose-Capillary: 86 mg/dL (ref 70–99)

## 2022-12-12 LAB — APTT: aPTT: 31 s (ref 24–36)

## 2022-12-12 LAB — URINALYSIS, ROUTINE W REFLEX MICROSCOPIC
Bilirubin Urine: NEGATIVE
Glucose, UA: NEGATIVE mg/dL
Ketones, ur: NEGATIVE mg/dL
Nitrite: NEGATIVE
Protein, ur: NEGATIVE mg/dL
RBC / HPF: >50 RBC/hpf (ref 0–5)
Specific Gravity, Urine: 1.011 (ref 1.005–1.030)
pH: 6 (ref 5.0–8.0)

## 2022-12-12 LAB — I-STAT CHEM 8, ED
BUN: 12 mg/dL (ref 8–23)
Calcium, Ion: 1.03 mmol/L — ABNORMAL LOW (ref 1.15–1.40)
Chloride: 102 mmol/L (ref 98–111)
Creatinine, Ser: 1 mg/dL (ref 0.44–1.00)
Glucose, Bld: 98 mg/dL (ref 70–99)
HCT: 37 % (ref 36.0–46.0)
Hemoglobin: 12.6 g/dL (ref 12.0–15.0)
Potassium: 4 mmol/L (ref 3.5–5.1)
Sodium: 143 mmol/L (ref 135–145)
TCO2: 29 mmol/L (ref 22–32)

## 2022-12-12 LAB — DIFFERENTIAL
Abs Immature Granulocytes: 0.02 10*3/uL (ref 0.00–0.07)
Basophils Absolute: 0.1 10*3/uL (ref 0.0–0.1)
Basophils Relative: 1 %
Eosinophils Absolute: 1 10*3/uL — ABNORMAL HIGH (ref 0.0–0.5)
Eosinophils Relative: 11 %
Immature Granulocytes: 0 %
Lymphocytes Relative: 17 %
Lymphs Abs: 1.6 10*3/uL (ref 0.7–4.0)
Monocytes Absolute: 0.7 10*3/uL (ref 0.1–1.0)
Monocytes Relative: 7 %
Neutro Abs: 6.1 10*3/uL (ref 1.7–7.7)
Neutrophils Relative %: 64 %

## 2022-12-12 LAB — CBC
HCT: 36.8 % (ref 36.0–46.0)
Hemoglobin: 11.6 g/dL — ABNORMAL LOW (ref 12.0–15.0)
MCH: 30.9 pg (ref 26.0–34.0)
MCHC: 31.5 g/dL (ref 30.0–36.0)
MCV: 97.9 fL (ref 80.0–100.0)
Platelets: 262 10*3/uL (ref 150–400)
RBC: 3.76 MIL/uL — ABNORMAL LOW (ref 3.87–5.11)
RDW: 13.2 % (ref 11.5–15.5)
WBC: 9.5 10*3/uL (ref 4.0–10.5)
nRBC: 0 % (ref 0.0–0.2)

## 2022-12-12 LAB — RAPID URINE DRUG SCREEN, HOSP PERFORMED
Amphetamines: NOT DETECTED
Barbiturates: NOT DETECTED
Benzodiazepines: NOT DETECTED
Cocaine: NOT DETECTED
Opiates: NOT DETECTED
Tetrahydrocannabinol: NOT DETECTED

## 2022-12-12 LAB — PROTIME-INR
INR: 1.2 (ref 0.8–1.2)
Prothrombin Time: 15.7 s — ABNORMAL HIGH (ref 11.4–15.2)

## 2022-12-12 MED ORDER — CEFDINIR 300 MG PO CAPS: 300.0000 mg | ORAL_CAPSULE | Freq: Two times a day (BID) | ORAL | 0 refills | Status: AC

## 2022-12-12 MED ORDER — LACTATED RINGERS IV BOLUS
500.0000 mL | Freq: Once | INTRAVENOUS | Status: AC
  Administered 2022-12-12: 500 mL via INTRAVENOUS

## 2022-12-12 MED ORDER — SODIUM CHLORIDE 0.9 % IV SOLN
1.0000 g | Freq: Once | INTRAVENOUS | Status: AC
  Administered 2022-12-12: 1 g via INTRAVENOUS
  Filled 2022-12-12: qty 10

## 2022-12-12 NOTE — Discharge Instructions (Addendum)
You were evaluated today due to difficulty speaking.  Neurology evaluated you and had low suspicion for stroke or seizure.  I recommend you follow close with your primary care doctor.  Your urinalysis concerning for urinary tract infection.  He should complete the entire course of antibiotics.  If you develop any difficulty speaking, fever, severe pain or any other new concerning symptoms you should return to the ED.

## 2022-12-12 NOTE — ED Provider Notes (Addendum)
Handoff received from oncoming provider.  Patient initially presented as code stroke after presenting with apparent aphasia.  Reportedly has had multiple episodes like this before.  She is followed by neurology, CT head was unremarkable.  Neurology initially recommended MRI, although previous ED provider talked with neurology who later decided no MRI or vessel imaging was warranted.  Handoff received will plan to follow-up urinalysis and likely discharge home.  I talked with Dr. Otelia Limes again myself to verify recommendations.  He felt that she did not need MRI or vessel imaging and had low suspicion for stroke or seizure.  He felt like he she was appropriate for discharge home from his perspective.  On reevaluation she is asymptomatic, talking normally and joking with her son.  Vital signs are stable.  Her urinalysis concerning for UTI, she has had prior episodes like this with infection as well.  No signs of sepsis or bacteremia.  She was given dose of Rocephin, will discharge with prescription for antibiotics and recommend close PCP follow-up.  She and son are comfortable this plan.  She is currently staying at a nursing home.  Discharged in stable condition.  I did review her chest x-ray which showed possible left-sided atelectasis or pneumonia.  She has no cough or respiratory symptoms, however will discharge with antibiotics that would cover possible pneumonia as well.   Laurence Spates, MD 12/12/22 Izell Euless    Laurence Spates, MD 12/12/22 1910

## 2022-12-12 NOTE — Code Documentation (Signed)
Yvonne Bailey is an 82 yr old female with a PMH of CAD, MI, DVT, prior CVAs AF on Eliquis presenting to [facility] via EMS on 12/12/2022. Pt is from SNF where she was last seen at her baseline (verbal, wheelchair bound) at 0900. A few hours later, she was unable to speak. EMS called, and activated code stroke for aphasia.     Pt met by stroke team at bridge. CBG, labs obtained, airway cleared by EDP. Pt to CT with team. NIHSS 15. Pt unable to answer orientation questions, aphasic, dysarthric, and neither leg able to move. The following imaging was completed: CT. Per Dr. Otelia Limes, CT is negative for acute abnormality.  Pt began speaking in CT scanner, and continued to improve in room. As pt is ineligible for thrombolytics due to Eliquis, ineligible for thrombectomy due to MRS, low suspicion for stroke, resolving symptoms, code stroke was cancelled by Dr. Otelia Limes at 12:36. Bedside handoff with ED RN complete.

## 2022-12-12 NOTE — ED Notes (Signed)
PT son requesting updates, [name] [phone]

## 2022-12-12 NOTE — ED Notes (Signed)
Attempted to call [facility] for report x 3

## 2022-12-12 NOTE — Consult Note (Addendum)
Neurology Consultation  Reason for Consult: Code stroke  Referring Physician: Dr. Rubin Payor  CC: Aphasia/slurred speech   History is obtained from:EMS and medical record   HPI: Yvonne Bailey is an 82 y.o. female with a past medical history of CAD, MI, hx of DVT, HTN, HLD, hypothyroidism, prior strokes, PAD, CKD, P afib on Eliquis who presents from her facility for acute onset of aphasia. Patient arrived to [facility] via EMS. Code stroke activated by EMS. LKW 0900. At 0930 staff noted her to be not speaking. Patient was met at the bridge, exam with patient not speaking, follows commands, tracks, able to move bilateral uppers antigravity, lower extremities no movement. While in CT she was able to say "No", and "I don't know" to two separate questions. NIHSS 15  Code stroke CT head no acute process, ASPECTS 10. Code Stroke cancelled due to exam findings most consistent with functionality.     LKW: 0900 IV thrombolysis given?: No. Not a stroke and on Eliquis EVT:  No. Presentation not consistent with LVO, high mRS.   Premorbid modified Rankin scale (mRS):  4-Needs assistance to walk and tend to bodily needs  ROS:  Unable to obtain due to altered mental status.   Past Medical History:  Diagnosis Date   Bilateral leg edema 07/19/2015   CAD (coronary artery disease)    a. anterior MI s/p PCI in 1992. b. cath 08/2015 with severe three-vessel CAD turned down for CABG and underwent DESx5 to prox Cx/OM2/D1/oRCA/mRCA   Carotid artery disease (HCC)    a. carotid duplex 03/2015 showed 1-39% BICA, normal subclavian arteries, chronically occluded left vertebral, f/u recommended only PRN.   DVT (deep venous thrombosis) (HCC)    X1   History of nuclear stress test    a. Myoview 6/17: EF 20-25%, mid anteroseptal, apical anterior, apical septal, apical inferior, apical lateral and apical scar, no ischemia, intermediate risk   HTN (hypertension)    Hyperlipidemia    Hypokalemia    Hypothyroidism     Ischemic cardiomyopathy    a. Echo 6/17: EF 20-25%, apex appears akinetic, MAC, moderate MR, moderate LAE, mild RVE, trivial PI, PASP 47 mmHg (needs repeat with Definity contrast).  b. Limited echo with Definity contrast 7/17: EF 25-30%, moderate to severe LAE. c. Limited Echo 2018 showed EF 40-45%.   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 02/2017 in setting of noncompliance with Eliquis   TIA (transient ischemic attack)    Tricuspid regurgitation       Family History  Problem Relation Age of Onset   Diabetes Mother    Hypertension Mother    Stroke Brother        ?> 55   Coronary artery disease Brother        stent in 17s   Cancer Neg Hx       Social History:   reports that she quit smoking about 32 years ago. Her smoking use included cigarettes. She has never used smokeless tobacco. She reports that she does not drink alcohol and does not use drugs.   Medications No current facility-administered medications  for this encounter.  Current Outpatient Medications:    aspirin EC 81 MG tablet, Take 81 mg by mouth daily. Swallow whole., Disp: , Rfl:    atorvastatin (LIPITOR) 40 MG tablet, Take 2 tablets (80 mg total) by mouth daily., Disp: , Rfl:    dorzolamide-timolol (COSOPT) 2-0.5 % ophthalmic solution, Place 1 drop into both eyes in the morning, at noon, and at bedtime., Disp: , Rfl:    ELIQUIS 5 MG TABS tablet, TAKE 1 TABLET(5 MG) BY MOUTH TWICE DAILY, Disp: 180 tablet, Rfl: 0   furosemide (LASIX) 20 MG tablet, Take 1 tablet (20 mg total) by mouth daily., Disp: 30 tablet, Rfl: 0   levothyroxine (SYNTHROID) 100 MCG tablet, Take 100 mcg  by mouth daily before breakfast., Disp: , Rfl:    losartan (COZAAR) 25 MG tablet, Take 1 tablet (25 mg total) by mouth daily., Disp: 30 tablet, Rfl: 0   metoprolol tartrate (LOPRESSOR) 25 MG tablet, Take 0.5 tablets (12.5 mg total) by mouth 2 (two) times daily., Disp: 30 tablet, Rfl: 0   MYRBETRIQ 25 MG TB24 tablet, TAKE 1 TABLET(25 MG) BY MOUTH DAILY (Patient taking differently: Take 25 mg by mouth daily.), Disp: 30 tablet, Rfl: 5   Netarsudil Dimesylate (RHOPRESSA) 0.02 % SOLN, Place 1 drop into both eyes every evening., Disp: , Rfl:    nitroGLYCERIN (NITROSTAT) 0.4 MG SL tablet, Place 1 tablet (0.4 mg total) under the tongue every 5 (five) minutes as needed for chest pain., Disp: 25 tablet, Rfl: 1   polyethylene glycol (MIRALAX) 17 g packet, Take 17 g by mouth daily as needed for mild constipation., Disp: , Rfl:    senna-docusate (SENOKOT-S) 8.6-50 MG tablet, Take 2 tablets by mouth 2 (two) times daily., Disp: 30 tablet, Rfl: 0   sertraline (ZOLOFT) 50 MG tablet, Take 1 tablet (50 mg total) by mouth at bedtime., Disp: , Rfl:     Exam: Current vital signs: Pulse 67   Resp 18   Ht 5\' 7"  (1.702 m)   Wt 81 kg   BMI 27.97 kg/m  Vital signs in last 24 hours: Pulse Rate:  [67] 67 (10/03 1239) Resp:  [18] 18 (10/03 1239) Weight:  [81 kg] 81 kg (10/03 1200)  GENERAL: Awake, alert in NAD HEENT: - Normocephalic and atraumatic, dry mm  LUNGS - Clear to auscultation bilaterally with no wheezes CV - S1S2 RRR, no m/r/g, equal pulses bilaterally. ABDOMEN - Soft, nontender, nondistended with normoactive BS Ext: warm, well perfused, intact peripheral pulses, no edema  NEURO:  Mental Status: Patient is awake and alert, tracking visually and following commands. Almost completely mute. Will say " No" and "I don't know" in response to two questions.  Cranial Nerves: PERRL  EOMI , visual fields full when tested with blink-to-threat, no facial asymmetry, facial sensation intact, hearing intact,  tongue/uvula/soft palate midline, normal sternocleidomastoid and trapezius muscle strength. No evidence of tongue atrophy  Motor: Bilateral uppers 5/5, bilateral lowers 3/5 on exam limited by poor cooperation Tone is normal and bulk is normal  Sensation- Intact to light touch bilaterally  Coordination: FTN intact bilaterally, no ataxia in BLE.  Gait- Deferred  NIHSS   1a Level of Conscious.:  0 1b LOC Questions: 2 1c LOC Commands:  1 2 Best Gaze: 0 3 Visual: 0 4 Facial Palsy: 0 5a Motor Arm - left: 0 5b Motor Arm - Right: 0 6a Motor Leg - Left: 4 6b Motor Leg - Right: 4 7 Limb Ataxia: 0 8 Sensory: 0  9 Best Language: 2 10 Dysarthria: 2 11 Extinct. and Inatten.: 0 TOTAL: 15   Labs I have reviewed labs in epic and the results pertinent to this consultation are:   CBC    Component Value Date/Time   WBC 8.9 11/27/2022 0910   RBC 3.38 (L) 11/27/2022 0910   HGB 12.6 12/12/2022 1314   HGB 13.2 08/03/2019 1409   HCT 37.0 12/12/2022 1314   HCT 40.4 08/03/2019 1409   PLT 214 11/27/2022 0910   PLT 253 08/03/2019 1409   MCV 97.6 11/27/2022 0910   MCV 98 (H) 08/03/2019 1409   MCH 31.4 11/27/2022 0910   MCHC 32.1 11/27/2022 0910   RDW 13.5 11/27/2022 0910   RDW 12.1 08/03/2019 1409   LYMPHSABS 1.0 11/23/2022 1657   MONOABS 0.7 11/23/2022 1657   EOSABS 0.6 (H) 11/23/2022 1657   BASOSABS 0.1 11/23/2022 1657    CMP     Component Value Date/Time   NA 143 12/12/2022 1314   NA 144 08/03/2019 1409   K 4.0 12/12/2022 1314   CL 102 12/12/2022 1314   CO2 24 11/27/2022 0910   GLUCOSE 98 12/12/2022 1314   BUN 12 12/12/2022 1314   BUN 22 08/03/2019 1409   CREATININE 1.00 12/12/2022 1314   CREATININE 1.14 (H) 09/15/2015 1025   CALCIUM 8.3 (L) 11/27/2022 0910   PROT 6.2 (L) 11/24/2022 0845   PROT 6.5 04/13/2018 0000   ALBUMIN 3.2 (L) 11/24/2022 0845   ALBUMIN 4.1 04/13/2018 0000   AST 26 11/24/2022 0845   ALT 16 11/24/2022 0845   ALKPHOS 105 11/24/2022 0845   BILITOT 0.9  11/24/2022 0845   BILITOT 0.7 04/13/2018 0000   GFRNONAA 52 (L) 11/27/2022 0910   GFRAA 60 08/03/2019 1409    Lipid Panel     Component Value Date/Time   CHOL 104 11/24/2022 0846   CHOL 131 06/03/2017 0921   TRIG 39 11/24/2022 0846   HDL 41 11/24/2022 0846   HDL 40 06/03/2017 0921   CHOLHDL 2.5 11/24/2022 0846   VLDL 8 11/24/2022 0846   LDLCALC 55 11/24/2022 0846   LDLCALC 72 06/03/2017 0921    Lab Results  Component Value Date   HGBA1C 5.3 11/24/2022      Imaging I have reviewed the images obtained:  CT head: No acute process, ASPECTS 10  Assessment: 82 y.o. female with past medical history of CAD, MI, hx of DVT, HTN, HLD, hypothyroidism, prior strokes, PAD, CKD and paroxysmal afib on Eliquis who presents from her facility via EMS as a Code Stroke for acute onset of aphasia. LKW 0900. 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 No focal weakness noted. Some slow, laborious speech output was elicited which was accompanied by prominently dysthymic affect that then becomes flat with expressionless facies and patient staring straight forwards. This alternation between slow, laborious vocalizations burdened by dysthymic affectations and complete mutism with expressionless facies appears most likely to be psychogenic.  - CT head: No hemorrhage or CT evidence of an acute cortical infarct. ASPECTS is 10 when accounting for chronic findings. 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 - EKG: Sinus rhythm; Prolonged PR interval; LVH with secondary repolarization abnormality; old anterior infarct - Overall impression: Psychogenic pseudostroke - Not a candidate for TNK due to anticoagulation. Not a candidate for thrombectomy due to exam findings not being consistent with LVO.    Recommendations: - Check MRI  brain to evaluate for possible stroke.  - Cancel code stroke as patient is  not a candidate for TNK or thrombectomy - Further recommendations following MRI.   Addendum: - Has been diagnosed with a UTI - Now back to baseline with normal speech - Can cancel MRI - Outpatient Neurology follow up  Gevena Mart DNP, ACNPC-AG  [facility]  I have seen and examined the patient. I have formulated the assessment and recommendations. 82 year old female presenting after abrupt onset of aphasia at her facility. 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 No focal weakness noted. Some slow, laborious speech output was elicited which was accompanied by prominently dysthymic affect that then becomes flat with expressionless facies and patient staring straight forwards. This alternation between slow, laborious vocalizations burdened by dysthymic affectations and complete mutism with expressionless facies appears most likely to be psychogenic. Recommendations as above.  Electronically signed: Dr. Caryl Pina

## 2022-12-12 NOTE — ED Provider Notes (Addendum)
[place] EMERGENCY DEPARTMENT AT [facility] Provider Note   CSN: [id number] Arrival date & time: 12/12/22  1224     History  Chief Complaint  Patient presents with   Code Stroke    Yvonne Bailey is a 82 y.o. female.  HPI Patient presented as a code stroke.  Reportedly last normal at 9 AM.  At 930 patient was found increased weakness and not able to speak.  Was able to follow some commands.  Normally can transfer without assistance but needed help.  Met at bridge with myself and Dr. Otelia Limes from neurology.  Able to move both upper extremities.  Does follow commands.  He is on Eliquis and we have verified that she got it this morning.  However will not speak.  Initially does not necessarily even be attempting to speak.  Later in CT scan able to speak and start speaking more freely.    Home Medications Prior to Admission medications   Medication Sig Start Date End Date Taking? Authorizing Provider  aspirin EC 81 MG tablet Take 81 mg by mouth daily. Swallow whole.   Yes [provider]  atorvastatin (LIPITOR) 40 MG tablet Take 2 tablets (80 mg total) by mouth daily. 11/27/22  Yes Glade Lloyd, MD  dorzolamide-timolol (COSOPT) 2-0.5 % ophthalmic solution Place 1 drop into both eyes in the morning, at noon, and at bedtime.   Yes [provider]  ELIQUIS 5 MG TABS tablet TAKE 1 TABLET(5 MG) BY MOUTH TWICE DAILY 11/21/22  Yes Burns, Bobette Mo, MD  furosemide (LASIX) 20 MG tablet Take 1 tablet (20 mg total) by mouth daily. 11/28/22  Yes Glade Lloyd, MD  levothyroxine (SYNTHROID) 100 MCG tablet Take 100 mcg by mouth daily before breakfast.   Yes [provider]  losartan (COZAAR) 25 MG tablet Take 1 tablet (25 mg total) by mouth daily. 11/28/22  Yes Glade Lloyd, MD  metoprolol tartrate (LOPRESSOR) 25 MG tablet Take 0.5 tablets (12.5 mg total) by mouth 2 (two) times daily. 11/27/22  Yes Glade Lloyd, MD  MYRBETRIQ 25 MG TB24 tablet TAKE 1  TABLET(25 MG) BY MOUTH DAILY Patient taking differently: Take 25 mg by mouth daily. 04/30/22  Yes Burns, Bobette Mo, MD  Netarsudil Dimesylate (RHOPRESSA) 0.02 % SOLN Place 1 drop into both eyes every evening.   Yes [provider]  nitroGLYCERIN (NITROSTAT) 0.4 MG SL tablet Place 1 tablet (0.4 mg total) under the tongue every 5 (five) minutes as needed for chest pain. 09/08/15  Yes Little Ishikawa, NP  polyethylene glycol (MIRALAX) 17 g packet Take 17 g by mouth daily as needed for mild constipation. 11/27/22  Yes Glade Lloyd, MD  senna-docusate (SENOKOT-S) 8.6-50 MG tablet Take 2 tablets by mouth 2 (two) times daily. 11/27/22  Yes Glade Lloyd, MD  sertraline (ZOLOFT) 50 MG tablet Take 1 tablet (50 mg total) by mouth at bedtime. 11/27/22  Yes Glade Lloyd, MD      Allergies    Definity [perflutren lipid microsphere] and Pletal [cilostazol]    Review of Systems   Review of Systems  Physical Exam Updated Vital Signs BP (!) 164/88   Pulse (!) 59   Resp (!) 27   Ht 5\' 7"  (1.702 m)   Wt 81 kg   SpO2 (!) 84%   BMI 27.97 kg/m  Physical Exam Vitals and nursing note reviewed.  Cardiovascular:     Rate and Rhythm: Normal rate.  Abdominal:     Tenderness: There is no  abdominal tenderness.  Neurological:     Mental Status: She is alert.     Comments: Will move all extremities.  Eyes will follow throughout range of motion.  Initially not speaking but later would speak.  Has been following commands.  Complete NIH scoring done by neurology.     ED Results / Procedures / Treatments   Labs (all labs ordered are listed, but only abnormal results are displayed) Labs Reviewed  PROTIME-INR - Abnormal; Notable for the following components:      Result Value   Prothrombin Time 15.7 (*)    All other components within normal limits  CBC - Abnormal; Notable for the following components:   RBC 3.76 (*)    Hemoglobin 11.6 (*)    All other components within normal limits  DIFFERENTIAL -  Abnormal; Notable for the following components:   Eosinophils Absolute 1.0 (*)    All other components within normal limits  I-STAT CHEM 8, ED - Abnormal; Notable for the following components:   Calcium, Ion 1.03 (*)    All other components within normal limits  APTT  ETHANOL  RAPID URINE DRUG SCREEN, HOSP PERFORMED  URINALYSIS, ROUTINE W REFLEX MICROSCOPIC  COMPREHENSIVE METABOLIC PANEL  CBG MONITORING, ED    EKG None  Radiology CT HEAD CODE STROKE WO CONTRAST  Result Date: 12/12/2022 CLINICAL DATA:  Code stroke.  Altered mental status EXAM: CT HEAD WITHOUT CONTRAST TECHNIQUE: Contiguous axial images were obtained from the base of the skull through the vertex without intravenous contrast. RADIATION DOSE REDUCTION: This exam was performed according to the departmental dose-optimization program which includes automated exposure control, adjustment of the mA and/or kV according to patient size and/or use of iterative reconstruction technique. COMPARISON:  CT Head 11/23/22 FINDINGS: Brain: No hemorrhage. No hydrocephalus. No extra-axial fluid collection. 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 No mass effect. No mass lesion. Vascular: No hyperdense vessel or unexpected calcification. Skull: Normal. Negative for fracture or focal lesion. Sinuses/Orbits: No middle ear or mastoid effusion. Paranasal sinuses clear. Orbits are unremarkable. Other: None. ASPECTS ([place] Stroke Program Early CT Score): 10 IMPRESSION: 1. No hemorrhage or CT evidence of an acute cortical infarct. Aspects is 10 when accounting for chronic findings. 2. 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 Findings were paged to Dr. Otelia Limes on 12/12/22 at 12:37 PM via [facility] paging system. Electronically Signed   By: Lorenza Cambridge M.D.   On: 12/12/2022 12:38    Procedures Procedures    Medications Ordered in ED Medications - No data to  display  ED Course/ Medical Decision Making/ A&P                                 Medical Decision Making Amount and/or Complexity of Data Reviewed Labs: ordered. Radiology: ordered.   Patient came as a code stroke.  Potential aphasia.  Also potential functional deficits.  Is on Eliquis so not a TNK candidate.  Has baseline poor function and not a interventional candidate.  Code stroke canceled.  Initial head CT independently interpreted and shows no acute bleed.  I discussed with Dr. Otelia Limes.  Can cancel code stroke since will not get acute intervention.  Will need MRI and potentially vessel imaging.  Will also get urinalysis and basic blood work.  Further discussion with Dr. Otelia Limes after further chart review.  Has had multiple  episodes in the past with similar episodes of encephalopathy.  Reported to have previous seizure-like activity after discussion with son.  However has had negative EEGs with an episode.  We do not think that we need further imaging at this time.  Will not get vessel imaging or MRI.  Had recent echocardiogram.  Already on blood thinners.  Still pending urinalysis.  Care turned over to Dr. Earlene Plater.  Pulse ox was charted as 81 and 84%.  Discussed with staff and they state it was not actually low.  96% on room air at this time.  Will continue to monitor      Final Clinical Impression(s) / ED Diagnoses Final diagnoses:  None    Rx / DC Orders ED Discharge Orders     None         Benjiman Core, MD 12/12/22 3244    Benjiman Core, MD 12/12/22 1553

## 2022-12-12 NOTE — ED Notes (Signed)
Attempted to call [facility] for report x 2

## 2022-12-12 NOTE — ED Notes (Addendum)
This Clinical research associate spoke with Pt's son and same endorses PT has HX of "seizure-like activity", same states PT "doesn't jerk and shake, she just stops responding and her eyes move but she won't speak at all."

## 2022-12-12 NOTE — ED Notes (Signed)
PT son asking for updates,

## 2022-12-12 NOTE — ED Triage Notes (Signed)
Pt arrives via ems to the er from [facility]. LKN 0900. At 0930 pt was found unable to speak or take any steps which is her normal. Baseline usually stays in a wheelchair but can walk as needed. Pt arrives to ed unable to speak, BG 86. All other vitals WNL per ems

## 2022-12-12 NOTE — ED Notes (Signed)
Secretary arranging transport with [facility] to transport PT back to [facility].

## 2022-12-12 NOTE — ED Notes (Signed)
Attempted to call [facility] for report x 1.

## 2022-12-13 DIAGNOSIS — Z515 Encounter for palliative care: Secondary | ICD-10-CM | POA: Diagnosis not present

## 2022-12-13 DIAGNOSIS — R1311 Dysphagia, oral phase: Secondary | ICD-10-CM | POA: Diagnosis not present

## 2022-12-13 DIAGNOSIS — F03C11 Unspecified dementia, severe, with agitation: Secondary | ICD-10-CM | POA: Diagnosis not present

## 2022-12-13 DIAGNOSIS — N390 Urinary tract infection, site not specified: Secondary | ICD-10-CM | POA: Diagnosis not present

## 2022-12-13 LAB — URINE CULTURE: Culture: NO GROWTH

## 2022-12-31 DIAGNOSIS — Z515 Encounter for palliative care: Secondary | ICD-10-CM | POA: Diagnosis not present

## 2022-12-31 DIAGNOSIS — N390 Urinary tract infection, site not specified: Secondary | ICD-10-CM | POA: Diagnosis not present

## 2022-12-31 DIAGNOSIS — R1311 Dysphagia, oral phase: Secondary | ICD-10-CM | POA: Diagnosis not present

## 2022-12-31 DIAGNOSIS — F03C11 Unspecified dementia, severe, with agitation: Secondary | ICD-10-CM | POA: Diagnosis not present

## 2023-01-07 DIAGNOSIS — R1311 Dysphagia, oral phase: Secondary | ICD-10-CM | POA: Diagnosis not present

## 2023-01-07 DIAGNOSIS — Z515 Encounter for palliative care: Secondary | ICD-10-CM | POA: Diagnosis not present

## 2023-01-07 DIAGNOSIS — N390 Urinary tract infection, site not specified: Secondary | ICD-10-CM | POA: Diagnosis not present

## 2023-01-07 DIAGNOSIS — F03C11 Unspecified dementia, severe, with agitation: Secondary | ICD-10-CM | POA: Diagnosis not present

## 2023-01-14 ENCOUNTER — Telehealth: Payer: Self-pay | Admitting: Internal Medicine

## 2023-01-14 NOTE — Telephone Encounter (Signed)
FYI.Marland KitchenMarland KitchenRN called from [facility] called stating that they will be pushing start date back to tomorrow due to that they can't get in touch with the son.   [phone]

## 2023-01-14 NOTE — Telephone Encounter (Signed)
Noted  

## 2023-01-15 NOTE — Telephone Encounter (Signed)
Cara from [facility] called and said the patient will be a non-admit. They are unable to see her due to not being able to contact patient's son. They said he also does not have a voicemail set up for them to leave a message. Best callback is [phone]).

## 2023-01-15 NOTE — Telephone Encounter (Signed)
I am not sure  - this may be a nursing home physician or she may have changed PCPs

## 2023-01-20 ENCOUNTER — Telehealth: Payer: Self-pay | Admitting: Internal Medicine

## 2023-01-20 NOTE — Telephone Encounter (Signed)
Pt's Son [name] dropped off forms for Dr. Lawerance Bach to fill out from [facility] and I have placed them in the incoming basket @ the front desk.  He is requesting for the forms to be Fax & e-mail to him when is completed. His e-mail is the one on the pt's chart.  Thanks!  Rosana.

## 2023-01-22 ENCOUNTER — Emergency Department (HOSPITAL_COMMUNITY)
Admission: EM | Admit: 2023-01-22 | Discharge: 2023-01-24 | Disposition: A | Discharge status: Home / Self Care | Payer: Medicare HMO

## 2023-01-22 ENCOUNTER — Emergency Department (HOSPITAL_COMMUNITY): Payer: Medicare HMO

## 2023-01-22 ENCOUNTER — Encounter (HOSPITAL_COMMUNITY): Payer: Self-pay

## 2023-01-22 ENCOUNTER — Other Ambulatory Visit: Payer: Self-pay

## 2023-01-22 DIAGNOSIS — I6782 Cerebral ischemia: Secondary | ICD-10-CM | POA: Diagnosis not present

## 2023-01-22 DIAGNOSIS — F0390 Unspecified dementia without behavioral disturbance: Secondary | ICD-10-CM | POA: Insufficient documentation

## 2023-01-22 DIAGNOSIS — R5383 Other fatigue: Secondary | ICD-10-CM | POA: Diagnosis not present

## 2023-01-22 DIAGNOSIS — R531 Weakness: Secondary | ICD-10-CM | POA: Diagnosis not present

## 2023-01-22 DIAGNOSIS — M6281 Muscle weakness (generalized): Secondary | ICD-10-CM | POA: Diagnosis not present

## 2023-01-22 DIAGNOSIS — Z7982 Long term (current) use of aspirin: Secondary | ICD-10-CM | POA: Insufficient documentation

## 2023-01-22 DIAGNOSIS — F03918 Unspecified dementia, unspecified severity, with other behavioral disturbance: Secondary | ICD-10-CM

## 2023-01-22 DIAGNOSIS — I517 Cardiomegaly: Secondary | ICD-10-CM | POA: Diagnosis not present

## 2023-01-22 DIAGNOSIS — R262 Difficulty in walking, not elsewhere classified: Secondary | ICD-10-CM | POA: Diagnosis not present

## 2023-01-22 DIAGNOSIS — Z7901 Long term (current) use of anticoagulants: Secondary | ICD-10-CM | POA: Insufficient documentation

## 2023-01-22 DIAGNOSIS — I10 Essential (primary) hypertension: Secondary | ICD-10-CM | POA: Diagnosis not present

## 2023-01-22 DIAGNOSIS — R4182 Altered mental status, unspecified: Secondary | ICD-10-CM | POA: Diagnosis not present

## 2023-01-22 DIAGNOSIS — R918 Other nonspecific abnormal finding of lung field: Secondary | ICD-10-CM | POA: Diagnosis not present

## 2023-01-22 LAB — COMPREHENSIVE METABOLIC PANEL
ALT: 16 U/L (ref 0–44)
AST: 25 U/L (ref 15–41)
Albumin: 3.4 g/dL — ABNORMAL LOW (ref 3.5–5.0)
Alkaline Phosphatase: 123 U/L (ref 38–126)
Anion gap: 9 (ref 5–15)
BUN: 12 mg/dL (ref 8–23)
CO2: 26 mmol/L (ref 22–32)
Calcium: 9.1 mg/dL (ref 8.9–10.3)
Chloride: 105 mmol/L (ref 98–111)
Creatinine, Ser: 0.92 mg/dL (ref 0.44–1.00)
GFR, Estimated: >60 mL/min (ref >60)
Glucose, Bld: 89 mg/dL (ref 70–99)
Potassium: 3.4 mmol/L — ABNORMAL LOW (ref 3.5–5.1)
Sodium: 140 mmol/L (ref 135–145)
Total Bilirubin: 0.9 mg/dL (ref <1.2)
Total Protein: 7.1 g/dL (ref 6.5–8.1)

## 2023-01-22 LAB — URINALYSIS, ROUTINE W REFLEX MICROSCOPIC
Bilirubin Urine: NEGATIVE
Glucose, UA: NEGATIVE mg/dL
Ketones, ur: NEGATIVE mg/dL
Leukocytes,Ua: NEGATIVE
Nitrite: NEGATIVE
Protein, ur: NEGATIVE mg/dL
Specific Gravity, Urine: 1.013 (ref 1.005–1.030)
pH: 5 (ref 5.0–8.0)

## 2023-01-22 LAB — CBC WITH DIFFERENTIAL/PLATELET
Abs Immature Granulocytes: 0.01 10*3/uL (ref 0.00–0.07)
Basophils Absolute: 0.1 10*3/uL (ref 0.0–0.1)
Basophils Relative: 1 %
Eosinophils Absolute: 0.2 10*3/uL (ref 0.0–0.5)
Eosinophils Relative: 3 %
HCT: 41 % (ref 36.0–46.0)
Hemoglobin: 13.4 g/dL (ref 12.0–15.0)
Immature Granulocytes: 0 %
Lymphocytes Relative: 26 %
Lymphs Abs: 1.8 10*3/uL (ref 0.7–4.0)
MCH: 31.8 pg (ref 26.0–34.0)
MCHC: 32.7 g/dL (ref 30.0–36.0)
MCV: 97.2 fL (ref 80.0–100.0)
Monocytes Absolute: 0.5 10*3/uL (ref 0.1–1.0)
Monocytes Relative: 8 %
Neutro Abs: 4.4 10*3/uL (ref 1.7–7.7)
Neutrophils Relative %: 62 %
Platelets: 239 10*3/uL (ref 150–400)
RBC: 4.22 MIL/uL (ref 3.87–5.11)
RDW: 12.9 % (ref 11.5–15.5)
WBC: 7.1 10*3/uL (ref 4.0–10.5)
nRBC: 0 % (ref 0.0–0.2)

## 2023-01-22 MED ORDER — SODIUM CHLORIDE 0.9 % IV BOLUS
1000.0000 mL | Freq: Once | INTRAVENOUS | Status: AC
  Administered 2023-01-22: 1000 mL via INTRAVENOUS

## 2023-01-22 MED ORDER — APIXABAN 5 MG PO TABS
5.0000 mg | ORAL_TABLET | Freq: Two times a day (BID) | ORAL | Status: DC
  Administered 2023-01-23 – 2023-01-24 (×4): 5 mg via ORAL
  Filled 2023-01-22 (×4): qty 1

## 2023-01-22 MED ORDER — LEVOTHYROXINE SODIUM 100 MCG PO TABS
100.0000 ug | ORAL_TABLET | Freq: Every day | ORAL | Status: DC
  Administered 2023-01-23 – 2023-01-24 (×2): 100 ug via ORAL
  Filled 2023-01-22 (×2): qty 1

## 2023-01-22 MED ORDER — LOSARTAN POTASSIUM 50 MG PO TABS
25.0000 mg | ORAL_TABLET | Freq: Every day | ORAL | Status: DC
  Administered 2023-01-23 – 2023-01-24 (×2): 25 mg via ORAL
  Filled 2023-01-22 (×2): qty 1

## 2023-01-22 MED ORDER — ATORVASTATIN CALCIUM 80 MG PO TABS
80.0000 mg | ORAL_TABLET | Freq: Every day | ORAL | Status: DC
  Administered 2023-01-23 – 2023-01-24 (×2): 80 mg via ORAL
  Filled 2023-01-22 (×2): qty 1

## 2023-01-22 MED ORDER — SERTRALINE HCL 50 MG PO TABS
50.0000 mg | ORAL_TABLET | Freq: Every day | ORAL | Status: DC
  Administered 2023-01-23 (×2): 50 mg via ORAL
  Filled 2023-01-22 (×2): qty 1

## 2023-01-22 MED ORDER — FUROSEMIDE 20 MG PO TABS
20.0000 mg | ORAL_TABLET | Freq: Every day | ORAL | Status: DC
  Administered 2023-01-23 – 2023-01-24 (×2): 20 mg via ORAL
  Filled 2023-01-22 (×2): qty 1

## 2023-01-22 MED ORDER — METOPROLOL TARTRATE 25 MG PO TABS
12.5000 mg | ORAL_TABLET | Freq: Two times a day (BID) | ORAL | Status: DC
  Administered 2023-01-23 – 2023-01-24 (×4): 12.5 mg via ORAL
  Filled 2023-01-22 (×4): qty 1

## 2023-01-22 NOTE — ED Provider Notes (Signed)
[place] EMERGENCY DEPARTMENT AT [facility] Provider Note   CSN: [id number] Arrival date & time: 01/22/23  1857     History  Chief Complaint  Patient presents with   Fatigue    Yvonne Bailey is a 82 y.o. female.  82 year old female presenting emergency department with chief complaint of fatigue.  Patient holding her eyes closed, refusing to talk.  Will withdrawal all extremities to pain.  History of dementia. See ED course for further HPI.         Home Medications Prior to Admission medications   Medication Sig Start Date End Date Taking? Authorizing Provider  aspirin EC 81 MG tablet Take 81 mg by mouth daily. Swallow whole.    [provider]  atorvastatin (LIPITOR) 40 MG tablet Take 2 tablets (80 mg total) by mouth daily. 11/27/22   Glade Lloyd, MD  dorzolamide-timolol (COSOPT) 2-0.5 % ophthalmic solution Place 1 drop into both eyes in the morning, at noon, and at bedtime.    [provider]  ELIQUIS 5 MG TABS tablet TAKE 1 TABLET(5 MG) BY MOUTH TWICE DAILY 11/21/22   Pincus Sanes, MD  furosemide (LASIX) 20 MG tablet Take 1 tablet (20 mg total) by mouth daily. 11/28/22   Glade Lloyd, MD  levothyroxine (SYNTHROID) 100 MCG tablet Take 100 mcg by mouth daily before breakfast.    [provider]  losartan (COZAAR) 25 MG tablet Take 1 tablet (25 mg total) by mouth daily. 11/28/22   Glade Lloyd, MD  metoprolol tartrate (LOPRESSOR) 25 MG tablet Take 0.5 tablets (12.5 mg total) by mouth 2 (two) times daily. 11/27/22   Glade Lloyd, MD  MYRBETRIQ 25 MG TB24 tablet TAKE 1 TABLET(25 MG) BY MOUTH DAILY Patient taking differently: Take 25 mg by mouth daily. 04/30/22   Pincus Sanes, MD  Netarsudil Dimesylate (RHOPRESSA) 0.02 % SOLN Place 1 drop into both eyes every evening.    [provider]  nitroGLYCERIN (NITROSTAT) 0.4 MG SL tablet Place 1 tablet (0.4 mg total) under the tongue every 5 (five) minutes as needed for chest  pain. 09/08/15   Little Ishikawa, NP  polyethylene glycol (MIRALAX) 17 g packet Take 17 g by mouth daily as needed for mild constipation. 11/27/22   Glade Lloyd, MD  senna-docusate (SENOKOT-S) 8.6-50 MG tablet Take 2 tablets by mouth 2 (two) times daily. 11/27/22   Glade Lloyd, MD  sertraline (ZOLOFT) 50 MG tablet Take 1 tablet (50 mg total) by mouth at bedtime. 11/27/22   Glade Lloyd, MD      Allergies    Definity [perflutren lipid microsphere] and Pletal [cilostazol]    Review of Systems   Review of Systems  Physical Exam Updated Vital Signs BP (!) 158/82   Pulse 67   Temp (!) 97.4 F (36.3 C) (Axillary)   Resp (!) 21   Ht 5\' 7"  (1.702 m)   Wt 81 kg   SpO2 98%   BMI 27.97 kg/m  Physical Exam Vitals and nursing note reviewed.  Constitutional:      General: She is not in acute distress.    Appearance: She is not toxic-appearing.  HENT:     Head: Normocephalic and atraumatic.     Nose: Nose normal.     Mouth/Throat:     Mouth: Mucous membranes are moist.  Eyes:     Conjunctiva/sclera: Conjunctivae normal.     Pupils: Pupils are equal, round, and reactive to light.  Cardiovascular:     Rate  and Rhythm: Normal rate and regular rhythm.  Pulmonary:     Effort: Pulmonary effort is normal.  Abdominal:     General: Abdomen is flat. There is no distension.     Tenderness: There is no abdominal tenderness. There is no guarding or rebound.  Musculoskeletal:        General: No deformity. Normal range of motion.  Skin:    General: Skin is warm and dry.     Capillary Refill: Capillary refill takes less than 2 seconds.     Findings: No rash.  Neurological:     General: No focal deficit present.     Mental Status: She is alert. Mental status is at baseline.     Cranial Nerves: No cranial nerve deficit.     Sensory: No sensory deficit.     Motor: No weakness.     Gait: Gait normal.     ED Results / Procedures / Treatments   Labs (all labs ordered are listed, but only  abnormal results are displayed) Labs Reviewed  URINALYSIS, ROUTINE W REFLEX MICROSCOPIC - Abnormal; Notable for the following components:      Result Value   APPearance HAZY (*)    Hgb urine dipstick LARGE (*)    Bacteria, UA RARE (*)    All other components within normal limits  COMPREHENSIVE METABOLIC PANEL - Abnormal; Notable for the following components:   Potassium 3.4 (*)    Albumin 3.4 (*)    All other components within normal limits  URINE CULTURE  CBC WITH DIFFERENTIAL/PLATELET    EKG EKG Interpretation Date/Time:  Wednesday January 22 2023 19:19:34 EST Ventricular Rate:  64 PR Interval:    QRS Duration:  111 QT Interval:  444 QTC Calculation: 459 R Axis:   -16  Text Interpretation: Atrial fibrillation LVH with secondary repolarization abnormality Anterior Q waves, possibly due to LVH Confirmed by Estanislado Pandy [phone]) on 01/22/2023 10:46:50 PM  Radiology CT Head Wo Contrast  Result Date: 01/22/2023 CLINICAL DATA:  Mental status change EXAM: CT HEAD WITHOUT CONTRAST TECHNIQUE: Contiguous axial images were obtained from the base of the skull through the vertex without intravenous contrast. RADIATION DOSE REDUCTION: This exam was performed according to the departmental dose-optimization program which includes automated exposure control, adjustment of the mA and/or kV according to patient size and/or use of iterative reconstruction technique. COMPARISON:  Head CT 12/12/2022 FINDINGS: Brain: No acute territorial infarction, hemorrhage or intracranial mass. Atrophy and chronic small vessel ischemic changes of the white matter. Multifocal chronic infarcts within the right frontal lobe, left parietal lobe, right ganglial capsular region, and the bilateral cerebellum. Stable ventricle size. Vascular: No hyperdense vessels.  Carotid vascular calcification Skull: Normal. Negative for fracture or focal lesion. Sinuses/Orbits: No acute finding. Other: None IMPRESSION: 1. No CT evidence  for acute intracranial abnormality. 2. Atrophy and chronic small vessel ischemic changes of the white matter. Multifocal chronic infarcts. Electronically Signed   By: Jasmine Pang M.D.   On: 01/22/2023 20:41   DG Chest Portable 1 View  Result Date: 01/22/2023 CLINICAL DATA:  Altered mental status EXAM: PORTABLE CHEST 1 VIEW COMPARISON:  12/12/2022 FINDINGS: Cardiomegaly with mild diffuse interstitial opacity probably edema. No sizable effusion. No pneumothorax IMPRESSION: Cardiomegaly with mild diffuse interstitial opacity probably edema. Electronically Signed   By: Jasmine Pang M.D.   On: 01/22/2023 20:38    Procedures Procedures    Medications Ordered in ED Medications  atorvastatin (LIPITOR) tablet 80 mg (has no administration in time range)  apixaban (ELIQUIS) tablet 5 mg (has no administration in time range)  furosemide (LASIX) tablet 20 mg (has no administration in time range)  levothyroxine (SYNTHROID) tablet 100 mcg (has no administration in time range)  losartan (COZAAR) tablet 25 mg (has no administration in time range)  metoprolol tartrate (LOPRESSOR) tablet 12.5 mg (has no administration in time range)  sertraline (ZOLOFT) tablet 50 mg (has no administration in time range)  sodium chloride 0.9 % bolus 1,000 mL (1,000 mLs Intravenous New Bag/Given 01/22/23 2043)    ED Course/ Medical Decision Making/ A&P Clinical Course as of 01/23/23 0023  Wed Jan 22, 2023  1907 Per chart review seen on 12/12/22 by neurology in the ED as part of code stroke activation. Per their note: "Assessment: 82 y.o. female with past medical history of CAD, MI, hx of DVT, HTN, HLD, hypothyroidism, prior strokes, PAD, CKD and paroxysmal afib on Eliquis who presents from her facility via EMS as a Code Stroke for acute onset of aphasia. LKW 0900. 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 No focal weakness noted. Some slow, laborious speech  output was elicited which was accompanied by prominently dysthymic affect that then becomes flat with expressionless facies and patient staring straight forwards. This alternation between slow, laborious vocalizations burdened by dysthymic affectations and complete mutism with expressionless facies appears most likely to be psychogenic.  - CT head: No hemorrhage or CT evidence of an acute cortical infarct. ASPECTS is 10 when accounting for chronic findings. 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 - EKG: Sinus rhythm; Prolonged PR interval; LVH with secondary repolarization abnormality; old anterior infarct - Overall impression: Psychogenic pseudostroke - Not a candidate for TNK due to anticoagulation. Not a candidate for thrombectomy due to exam findings not being consistent with LVO.      Recommendations: - Check MRI brain to evaluate for possible stroke.  - Cancel code stroke as patient is not a candidate for TNK or thrombectomy - Further recommendations following MRI.    Addendum: - Has been diagnosed with a UTI - Now back to baseline with normal speech - Can cancel MRI - Outpatient Neurology follow up   " [TY]  1918 Was able to reach son.  Last known normal yesterday at 8 PM when she went to bed.  She woke up this morning and was lethargic and not acting her normal self.  Reportedly does have dementia, but is alert active and is able to carry on normal conversation.  Her age this morning stated that she did not not want to get out of bed or eat which is atypical for her.  Son notes he came to check on her and was able to get her to the bathroom, on the way back she is seemingly went limp, but did not pass out.  Did not hit her head.  Son reports his behavior is out of normal for her. [TY]  2249 Patient workup overall reassuring.  No leukocytosis, fever or tachycardia to suggest systemic infection.  Comprehensive metabolic panel without significant  metabolic derangements.  No transaminitis to suggest hepatobiliary disease.  Creatinine without evidence of acute kidney injury.  UA not consistent with urinary tract infection.  CT head negative for acute pathology.  Chest x-ray read as slight pulmonary edema.  Patient is maintaining oxygen saturation on room air does not appear to be in respiratory distress.  On my independent review  does appears to have some vascular congestion, however low suspicion that this is contributory to her presentation today.  Per chart review in October had similar episode and evaluated by neurology who is recommending outpatient follow-up as a thought her symptoms secondary to tube psychogenic source/dementia, son notes they have upcoming appointment.  Patient currently is alert talking no focal deficits on exam.  Her presentation today is seemingly similar to prior.  Son is on the way to verify mother is at baseline. [TY]  2351 Son presented to bedside.  Notes that mother is at her mental baseline.  She has had some waxing and waning and worsening dementia type symptoms for some time.  He does have CNA's that come out to the house, but not all the time and is concerned for mother safety at home.  He is asking for social work consult and possible placement as he is concerned for her safety with the sorts of behavioral issues. [TY]    Clinical Course User Index [TY] Coral Spikes, DO                                 Medical Decision Making Is an 82 year old female presenting emergency department with lethargy/altered mental status.  She is afebrile nontachycardic hemodynamically stable.  Initially refused participate in the exam, but after 20 to 30 minutes open her eyes and began speaking.  Following commands.  No localizing deficits.  Per chart review appears that this is not a new presentation for her.  Was evaluated by neurology last month for similar presentation.  See ED course for further recommendations.  Repeat workup  today as noted in ED course largely unremarkable.  Son at bedside and requesting placement.  See ED course for conversation with son.  At this point patient is back to her mental baseline with what sounds like worsening dementia per son report.  Will consult social work.  Home medications ordered.  Plan for TOC evaluation.  Amount and/or Complexity of Data Reviewed Labs: ordered. Radiology: ordered. ECG/medicine tests: ordered.  Risk Prescription drug management.          Final Clinical Impression(s) / ED Diagnoses Final diagnoses:  None    Rx / DC Orders ED Discharge Orders     None         Coral Spikes, DO 01/23/23 0023

## 2023-01-22 NOTE — ED Triage Notes (Signed)
PT BIB by [facility] from home after  PT'S home aide noticed PT was more lethargic than usual. Aide also reported PT has frequent urination. When EMS arrived PT had just urinated and PT is wet upon arrival to ED. PT has a hx of UTI'S and dementia baseline.

## 2023-01-22 NOTE — ED Provider Notes (Incomplete)
[place] EMERGENCY DEPARTMENT AT [facility] Provider Note   CSN: [id number] Arrival date & time: 01/22/23  1857     History {Add pertinent medical, surgical, social history, OB history to HPI:1} Chief Complaint  Patient presents with  . Fatigue    Yvonne Bailey is a 82 y.o. female.  82 year old female presenting emergency department with chief complaint of fatigue.  Patient holding her eyes closed, refusing to talk.  Will withdrawal all extremities to pain.  History of dementia.        Home Medications Prior to Admission medications   Medication Sig Start Date End Date Taking? Authorizing Provider  aspirin EC 81 MG tablet Take 81 mg by mouth daily. Swallow whole.    [provider]  atorvastatin (LIPITOR) 40 MG tablet Take 2 tablets (80 mg total) by mouth daily. 11/27/22   Glade Lloyd, MD  dorzolamide-timolol (COSOPT) 2-0.5 % ophthalmic solution Place 1 drop into both eyes in the morning, at noon, and at bedtime.    [provider]  ELIQUIS 5 MG TABS tablet TAKE 1 TABLET(5 MG) BY MOUTH TWICE DAILY 11/21/22   Pincus Sanes, MD  furosemide (LASIX) 20 MG tablet Take 1 tablet (20 mg total) by mouth daily. 11/28/22   Glade Lloyd, MD  levothyroxine (SYNTHROID) 100 MCG tablet Take 100 mcg by mouth daily before breakfast.    [provider]  losartan (COZAAR) 25 MG tablet Take 1 tablet (25 mg total) by mouth daily. 11/28/22   Glade Lloyd, MD  metoprolol tartrate (LOPRESSOR) 25 MG tablet Take 0.5 tablets (12.5 mg total) by mouth 2 (two) times daily. 11/27/22   Glade Lloyd, MD  MYRBETRIQ 25 MG TB24 tablet TAKE 1 TABLET(25 MG) BY MOUTH DAILY Patient taking differently: Take 25 mg by mouth daily. 04/30/22   Pincus Sanes, MD  Netarsudil Dimesylate (RHOPRESSA) 0.02 % SOLN Place 1 drop into both eyes every evening.    [provider]  nitroGLYCERIN (NITROSTAT) 0.4 MG SL tablet Place 1 tablet (0.4 mg total) under the tongue every  5 (five) minutes as needed for chest pain. 09/08/15   Little Ishikawa, NP  polyethylene glycol (MIRALAX) 17 g packet Take 17 g by mouth daily as needed for mild constipation. 11/27/22   Glade Lloyd, MD  senna-docusate (SENOKOT-S) 8.6-50 MG tablet Take 2 tablets by mouth 2 (two) times daily. 11/27/22   Glade Lloyd, MD  sertraline (ZOLOFT) 50 MG tablet Take 1 tablet (50 mg total) by mouth at bedtime. 11/27/22   Glade Lloyd, MD      Allergies    Definity [perflutren lipid microsphere] and Pletal [cilostazol]    Review of Systems   Review of Systems  Physical Exam Updated Vital Signs BP (!) 158/82   Pulse 67   Temp (!) 97.4 F (36.3 C) (Axillary)   Resp (!) 21   Ht 5\' 7"  (1.702 m)   Wt 81 kg   SpO2 98%   BMI 27.97 kg/m  Physical Exam Vitals and nursing note reviewed.  Constitutional:      General: She is not in acute distress.    Appearance: She is not toxic-appearing.  HENT:     Head: Normocephalic and atraumatic.     Nose: Nose normal.     Mouth/Throat:     Mouth: Mucous membranes are moist.  Eyes:     Conjunctiva/sclera: Conjunctivae normal.     Pupils: Pupils are equal, round, and reactive to light.  Cardiovascular:  Rate and Rhythm: Normal rate and regular rhythm.  Pulmonary:     Effort: Pulmonary effort is normal.  Abdominal:     General: Abdomen is flat. There is no distension.     Tenderness: There is no abdominal tenderness. There is no guarding or rebound.  Musculoskeletal:        General: No deformity. Normal range of motion.  Skin:    General: Skin is warm and dry.     Capillary Refill: Capillary refill takes less than 2 seconds.     Findings: No rash.  Neurological:     General: No focal deficit present.     Mental Status: She is alert. Mental status is at baseline.     Cranial Nerves: No cranial nerve deficit.     Sensory: No sensory deficit.     Motor: No weakness.     Gait: Gait normal.     ED Results / Procedures / Treatments    Labs (all labs ordered are listed, but only abnormal results are displayed) Labs Reviewed  URINALYSIS, ROUTINE W REFLEX MICROSCOPIC - Abnormal; Notable for the following components:      Result Value   APPearance HAZY (*)    Hgb urine dipstick LARGE (*)    Bacteria, UA RARE (*)    All other components within normal limits  COMPREHENSIVE METABOLIC PANEL - Abnormal; Notable for the following components:   Potassium 3.4 (*)    Albumin 3.4 (*)    All other components within normal limits  URINE CULTURE  CBC WITH DIFFERENTIAL/PLATELET    EKG EKG Interpretation Date/Time:  Wednesday January 22 2023 19:19:34 EST Ventricular Rate:  64 PR Interval:    QRS Duration:  111 QT Interval:  444 QTC Calculation: 459 R Axis:   -16  Text Interpretation: Atrial fibrillation LVH with secondary repolarization abnormality Anterior Q waves, possibly due to LVH Confirmed by Estanislado Pandy [phone]) on 01/22/2023 10:46:50 PM  Radiology CT Head Wo Contrast  Result Date: 01/22/2023 CLINICAL DATA:  Mental status change EXAM: CT HEAD WITHOUT CONTRAST TECHNIQUE: Contiguous axial images were obtained from the base of the skull through the vertex without intravenous contrast. RADIATION DOSE REDUCTION: This exam was performed according to the departmental dose-optimization program which includes automated exposure control, adjustment of the mA and/or kV according to patient size and/or use of iterative reconstruction technique. COMPARISON:  Head CT 12/12/2022 FINDINGS: Brain: No acute territorial infarction, hemorrhage or intracranial mass. Atrophy and chronic small vessel ischemic changes of the white matter. Multifocal chronic infarcts within the right frontal lobe, left parietal lobe, right ganglial capsular region, and the bilateral cerebellum. Stable ventricle size. Vascular: No hyperdense vessels.  Carotid vascular calcification Skull: Normal. Negative for fracture or focal lesion. Sinuses/Orbits: No acute  finding. Other: None IMPRESSION: 1. No CT evidence for acute intracranial abnormality. 2. Atrophy and chronic small vessel ischemic changes of the white matter. Multifocal chronic infarcts. Electronically Signed   By: Jasmine Pang M.D.   On: 01/22/2023 20:41   DG Chest Portable 1 View  Result Date: 01/22/2023 CLINICAL DATA:  Altered mental status EXAM: PORTABLE CHEST 1 VIEW COMPARISON:  12/12/2022 FINDINGS: Cardiomegaly with mild diffuse interstitial opacity probably edema. No sizable effusion. No pneumothorax IMPRESSION: Cardiomegaly with mild diffuse interstitial opacity probably edema. Electronically Signed   By: Jasmine Pang M.D.   On: 01/22/2023 20:38    Procedures Procedures  {Document cardiac monitor, telemetry assessment procedure when appropriate:1}  Medications Ordered in ED Medications  sodium chloride 0.9 %  bolus 1,000 mL (1,000 mLs Intravenous New Bag/Given 01/22/23 2043)    ED Course/ Medical Decision Making/ A&P Clinical Course as of 01/22/23 2352  Wed Jan 22, 2023  8295 Per chart review seen on 12/12/22 by neurology in the ED as part of code stroke activation. Per their note: "Assessment: 82 y.o. female with past medical history of CAD, MI, hx of DVT, HTN, HLD, hypothyroidism, prior strokes, PAD, CKD and paroxysmal afib on Eliquis who presents from her facility via EMS as a Code Stroke for acute onset of aphasia. LKW 0900. 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 No focal weakness noted. Some slow, laborious speech output was elicited which was accompanied by prominently dysthymic affect that then becomes flat with expressionless facies and patient staring straight forwards. This alternation between slow, laborious vocalizations burdened by dysthymic affectations and complete mutism with expressionless facies appears most likely to be psychogenic.  - CT head: No hemorrhage or CT evidence of an acute cortical  infarct. ASPECTS is 10 when accounting for chronic findings. 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 - EKG: Sinus rhythm; Prolonged PR interval; LVH with secondary repolarization abnormality; old anterior infarct - Overall impression: Psychogenic pseudostroke - Not a candidate for TNK due to anticoagulation. Not a candidate for thrombectomy due to exam findings not being consistent with LVO.      Recommendations: - Check MRI brain to evaluate for possible stroke.  - Cancel code stroke as patient is not a candidate for TNK or thrombectomy - Further recommendations following MRI.    Addendum: - Has been diagnosed with a UTI - Now back to baseline with normal speech - Can cancel MRI - Outpatient Neurology follow up   " [TY]  1918 Was able to reach son.  Last known normal yesterday at 8 PM when she went to bed.  She woke up this morning and was lethargic and not acting her normal self.  Reportedly does have dementia, but is alert active and is able to carry on normal conversation.  Her age this morning stated that she did not not want to get out of bed or eat which is atypical for her.  Son notes he came to check on her and was able to get her to the bathroom, on the way back she is seemingly went limp, but did not pass out.  Did not hit her head.  Son reports his behavior is out of normal for her. [TY]  2249 Patient workup overall reassuring.  No leukocytosis, fever or tachycardia to suggest systemic infection.  Comprehensive metabolic panel without significant metabolic derangements.  No transaminitis to suggest hepatobiliary disease.  Creatinine without evidence of acute kidney injury.  UA not consistent with urinary tract infection.  CT head negative for acute pathology.  Chest x-ray read as slight pulmonary edema.  Patient is maintaining oxygen saturation on room air does not appear to be in respiratory distress.  On my independent review does appears to  have some vascular congestion, however low suspicion that this is contributory to her presentation today.  Per chart review in October had similar episode and evaluated by neurology who is recommending outpatient follow-up as a thought her symptoms secondary to tube psychogenic source/dementia, son notes they have upcoming appointment.  Patient currently is alert talking no focal deficits on exam.  Her presentation today is seemingly similar to prior.  Son is on the  way to verify mother is at baseline. [TY]  2351 Son presented to bedside.  Notes that mother is at her mental baseline.  She has had some waxing and waning and worsening dementia type symptoms for some time.  He does have CNA's that come out to the house, but not all the time and is concerned for mother safety at home.  He is asking for social work consult and possible placement as he is concerned for her safety with the sorts of behavioral issues. [TY]    Clinical Course User Index [TY] Coral Spikes, DO   {   Click here for ABCD2, HEART and other calculatorsREFRESH Note before signing :1}                              Medical Decision Making Is an 82 year old female presenting emergency department with lethargy/altered mental status.  She is afebrile nontachycardic hemodynamically stable.  Initially refused participate in the exam, but after 20 to 30 minutes open her eyes and began speaking.  Following commands.  No localizing deficits.  Per chart review appears that this is not a new presentation for her.  Was evaluated by neurology last month for similar presentation.  See ED course for further recommendations.  Repeat workup today as noted in ED course largely unremarkable.  Son at bedside and requesting placement.  See ED course for conversation with son.  At this point patient is back to her mental baseline with what sounds like worsening dementia per son report.  Will consult social work.  Home medications ordered.  Plan for TOC  evaluation.  Amount and/or Complexity of Data Reviewed Labs: ordered. Radiology: ordered. ECG/medicine tests: ordered.    {Document critical care time when appropriate:1} {Document review of labs and clinical decision tools ie heart score, Chads2Vasc2 etc:1}  {Document your independent review of radiology images, and any outside records:1} {Document your discussion with family members, caretakers, and with consultants:1} {Document social determinants of health affecting pt's care:1} {Document your decision making why or why not admission, treatments were needed:1} Final Clinical Impression(s) / ED Diagnoses Final diagnoses:  None    Rx / DC Orders ED Discharge Orders     None

## 2023-01-22 NOTE — Telephone Encounter (Signed)
Placed in folder for review 

## 2023-01-22 NOTE — ED Notes (Signed)
Patient transported to CT 

## 2023-01-23 ENCOUNTER — Other Ambulatory Visit: Payer: Self-pay

## 2023-01-23 ENCOUNTER — Encounter (HOSPITAL_COMMUNITY): Payer: Self-pay

## 2023-01-23 NOTE — TOC Initial Note (Signed)
Transition of Care [facility]) - Initial/Assessment Note    Patient Details  Name: Yvonne Bailey MRN: [id number] Date of Birth: 06/05/1940  Transition of Care [facility]) CM/SW Contact:    Susa Simmonds, LCSWA Phone Number: 01/23/2023, 10:20 AM  Clinical Narrative:  CSW spoke with patients son [name], [phone] . Mr. Aundria Rud stated he currently doesn't have a long term care plan for his mother. CSW explained to Mr. Aundria Rud that she is not sure patient will get approved again for a short term rehab stay. Mr. Aundria Rud stated his mother was just released from [facility] SNF about two weeks ago. CSW asked Mr. Aundria Rud if he has applied for medicaid.  Mr. Aundria Rud stated [place] gave him a medicaid application but he has not completed it. CSW told Mr. Aundria Rud that in order to get long term care in a nursing home he would either have to pay out of pocket or be approved for medicaid. CSW told Mr. Aundria Rud that social services has 30-45 days to process the medicaid application. CSW advised Mr. Aundria Rud to ger that application completed today as the holiday season is approaching and it could take them longer to process. CSW told Mr. Aundria Rud that if patient isn't approved for SNF then he would have to find an alternative plan at home or pay privately. Mr. Aundria Rud voiced understanding. PT consult is pending at this time.             Expected Discharge Plan: Skilled Nursing Facility Barriers to Discharge: Continued Medical Work up   Patient Goals and CMS Choice Patient states their goals for this hospitalization and ongoing recovery are:: SNF or home          Expected Discharge Plan and Services       Living arrangements for the past 2 months: Single Family Home                                      Prior Living Arrangements/Services Living arrangements for the past 2 months: Single Family Home Lives with:: Adult Children Patient language and need for interpreter reviewed:: Yes Do you feel  safe going back to the place where you live?: Yes      Need for Family Participation in Patient Care: Yes (Comment) Care giver support system in place?: Yes (comment) Current home services: Homehealth aide Criminal Activity/Legal Involvement Pertinent to Current Situation/Hospitalization: No - Comment as needed  Activities of Daily Living      Permission Sought/Granted Permission sought to share information with : Family Supports Permission granted to share information with : Yes, Verbal Permission Granted  Share Information with NAME: [name]     Permission granted to share info w Relationship: Son  Permission granted to share info w Contact Information: [phone]  Emotional Assessment   Attitude/Demeanor/Rapport: Engaged Affect (typically observed): Accepting Orientation: : Oriented to Self Alcohol / Substance Use: Not Applicable Psych Involvement: No (comment)  Admission diagnosis:  UTI Patient Active Problem List   Diagnosis Date Noted   Community acquired pneumonia 11/24/2022   Complicated UTI (urinary tract infection) 11/23/2022   Recurrent episodes of unresponsiveness 07/02/2022   Altered mental status 06/30/2022   TIA (transient ischemic attack) 06/29/2022   Moderate dementia (HCC) 04/30/2022   UTI (urinary tract infection) 04/23/2022   Fall 03/17/2022   Hypomagnesemia 11/19/2021   Hypokalemia 11/17/2021   Catheter-associated urinary tract infection (HCC) 09/03/2021   Acute metabolic  encephalopathy 09/03/2021   Acute cystitis with hematuria 10/17/2020   Rash 08/29/2020   Anxiety and depression 03/28/2020   Overactive bladder 03/28/2020   Lack of motivation 12/21/2019   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   Aphasia as late effect of cerebrovascular accident (CVA)    Acute ischemic cerebrovascular accident (CVA) involving left middle cerebral artery [facility])    Coronary artery disease involving native coronary artery without angina pectoris     Stage 3 chronic kidney disease (HCC)    Prediabetes 07/28/2016   DOE (dyspnea on exertion) 07/09/2016   Gait instability 07/09/2016   Cardiomyopathy, ischemic    Chronic systolic heart failure (HCC) 08/21/2015   Bilateral leg edema 07/19/2015   Atrial fibrillation (HCC) 07/19/2015   Urinary urgency 03/30/2015   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   Small vessel disease, cerebrovascular 12/17/2012   CAROTID BRUIT, RIGHT 08/10/2008   Peripheral vascular disease (HCC) 06/04/2007   Diverticulosis of large intestine 06/04/2007   Hypothyroidism 02/24/2007   Dyslipidemia 02/24/2007   Essential hypertension 02/24/2007   Acute thromboembolism of deep veins of lower extremity (HCC) 01/21/2007   PCP:  Pincus Sanes, MD Pharmacy:   [facility] [id number] [place], [place] - [address] AT [facility] OF [address] [place] [address] Phone: [phone] Fax: [phone]  [facility] - [place], [place] - [address] [place] [address] Phone: [phone] Fax: [phone]  [facility] #[id number] - [place], [place] - [address] AT [facility] [address] [place] [address] Phone: [phone] Fax: [phone]     Social Determinants of Health (SDOH) Social History: SDOH Screenings   Food Insecurity: No Food Insecurity (11/24/2022)  Housing: Low Risk  (11/24/2022)  Transportation Needs: No Transportation Needs (11/24/2022)  Utilities: Patient Unable To Answer (11/24/2022)  Alcohol Screen: Low Risk  (12/17/2021)  Depression (PHQ2-9): Low Risk  (03/18/2022)  Financial Resource Strain: Low Risk  (12/17/2021)  Physical Activity: Inactive (12/17/2021)  Social Connections: Patient Declined (12/17/2021)  Stress: No Stress Concern Present (12/17/2021)  Tobacco Use: Medium Risk (01/23/2023)   SDOH Interventions:     Readmission Risk Interventions    11/19/2021   10:32  AM  Readmission Risk Prevention Plan  Transportation Screening Complete  PCP or Specialist Appt within 5-7 Days Complete  Home Care Screening Complete  Medication Review (RN CM) Complete

## 2023-01-23 NOTE — Progress Notes (Signed)
CSW spoke with patients son who accepted the SNF bed offer at [facility]. CSW also confirmed the offer with [place] in admissions at [facility]. [place] did notify CSW that patient was at [facility] from 11/27/22-01/11/23 and used 40 SNF days. Patient is now in copay days. [place] stated that patient could be paying $200-$300 daily. CSW contacted the son who stated he would like to speak with admissions further to see if he would be able to afford it. Patients son also stated that his mother was supposed to have home health but no one showed up. CSW notified [place] in admissions who will speak with the son and will find out what home health agency patient was supposed to have.    CSW started the insurance authorization in case patients son is able to pay the copay days. [place] Auth: [id number])

## 2023-01-23 NOTE — [place] FL2 (Signed)
[place] MEDICAID FL2 LEVEL OF CARE FORM     IDENTIFICATION  Patient Name: Yvonne Bailey Birthdate: 1940/09/24 Sex: female Admission Date (Current Location): 01/22/2023  [facility] and [place] Number:  Producer, television/film/video and Address:  [facility] [place], [address], [place], [place] [address]      Provider Number: [id number]  Attending Physician Name and Address:  No att. providers found  Relative Name and Phone Number:  [name]   [phone]    Current Level of Care: Hospital Recommended Level of Care: Skilled Nursing Facility Prior Approval Number:    Date Approved/Denied:   PASRR Number: [id number]  Discharge Plan: SNF    Current Diagnoses: Patient Active Problem List   Diagnosis Date Noted   Community acquired pneumonia 11/24/2022   Complicated UTI (urinary tract infection) 11/23/2022   Recurrent episodes of unresponsiveness 07/02/2022   Altered mental status 06/30/2022   TIA (transient ischemic attack) 06/29/2022   Moderate dementia (HCC) 04/30/2022   UTI (urinary tract infection) 04/23/2022   Fall 03/17/2022   Hypomagnesemia 11/19/2021   Hypokalemia 11/17/2021   Catheter-associated urinary tract infection (HCC) 09/03/2021   Acute metabolic encephalopathy 09/03/2021   Acute cystitis with hematuria 10/17/2020   Rash 08/29/2020   Anxiety and depression 03/28/2020   Overactive bladder 03/28/2020   Lack of motivation 12/21/2019   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   Aphasia as late effect of cerebrovascular accident (CVA)    Acute ischemic cerebrovascular accident (CVA) involving left middle cerebral artery territory [place])    Coronary artery disease involving native coronary artery without angina pectoris    Stage 3 chronic kidney disease (HCC)    Prediabetes 07/28/2016   DOE (dyspnea on exertion) 07/09/2016   Gait instability 07/09/2016   Cardiomyopathy, ischemic    Chronic systolic heart failure (HCC) 08/21/2015    Bilateral leg edema 07/19/2015   Atrial fibrillation (HCC) 07/19/2015   Urinary urgency 03/30/2015   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   Small vessel disease, cerebrovascular 12/17/2012   CAROTID BRUIT, RIGHT 08/10/2008   Peripheral vascular disease (HCC) 06/04/2007   Diverticulosis of large intestine 06/04/2007   Hypothyroidism 02/24/2007   Dyslipidemia 02/24/2007   Essential hypertension 02/24/2007   Acute thromboembolism of deep veins of lower extremity (HCC) 01/21/2007    Orientation RESPIRATION BLADDER Height & Weight     Self  Normal Continent Weight: 178 lb 9.2 oz (81 kg) Height:  5\' 7"  (170.2 cm)  BEHAVIORAL SYMPTOMS/MOOD NEUROLOGICAL BOWEL NUTRITION STATUS      Continent  (Heart Healthy)  AMBULATORY STATUS COMMUNICATION OF NEEDS Skin   Extensive Assist Verbally Normal                       Personal Care Assistance Level of Assistance  Bathing, Feeding, Dressing Bathing Assistance: Maximum assistance Feeding assistance: Independent Dressing Assistance: Maximum assistance     Functional Limitations Info  Sight, Hearing, Speech Sight Info: Adequate Hearing Info: Adequate Speech Info: Adequate    SPECIAL CARE FACTORS FREQUENCY                       Contractures Contractures Info: Not present    Additional Factors Info  Code Status, Allergies Code Status Info: Full Allergies Info: Definity (perflutren Lipid Microsphere) , Pletal (cilostazol)           Current Medications (01/23/2023):  This is the current hospital active medication list Current Facility-Administered Medications  Medication Dose Route Frequency  Provider Last Rate Last Admin   apixaban (ELIQUIS) tablet 5 mg  5 mg Oral BID Estanislado Pandy J, DO   5 mg at 01/23/23 1136   atorvastatin (LIPITOR) tablet 80 mg  80 mg Oral Daily Coral Spikes, DO   80 mg at 01/23/23 1136   furosemide (LASIX) tablet 20 mg  20 mg Oral Daily Coral Spikes, DO   20 mg at 01/23/23 1136   levothyroxine  (SYNTHROID) tablet 100 mcg  100 mcg Oral QAC breakfast Coral Spikes, DO   100 mcg at 01/23/23 1136   losartan (COZAAR) tablet 25 mg  25 mg Oral Daily Coral Spikes, DO   25 mg at 01/23/23 1136   metoprolol tartrate (LOPRESSOR) tablet 12.5 mg  12.5 mg Oral BID Coral Spikes, DO   12.5 mg at 01/23/23 1136   sertraline (ZOLOFT) tablet 50 mg  50 mg Oral QHS Young, Travis J, DO   50 mg at 01/23/23 0100   Current Outpatient Medications  Medication Sig Dispense Refill   aspirin EC 81 MG tablet Take 81 mg by mouth daily. Swallow whole.     atorvastatin (LIPITOR) 40 MG tablet Take 2 tablets (80 mg total) by mouth daily.     dorzolamide-timolol (COSOPT) 2-0.5 % ophthalmic solution Place 1 drop into both eyes in the morning, at noon, and at bedtime.     ELIQUIS 5 MG TABS tablet TAKE 1 TABLET(5 MG) BY MOUTH TWICE DAILY 180 tablet 0   furosemide (LASIX) 20 MG tablet Take 1 tablet (20 mg total) by mouth daily. (Patient taking differently: Take 20 mg by mouth daily as needed for edema.) 30 tablet 0   levothyroxine (SYNTHROID) 100 MCG tablet Take 100 mcg by mouth daily before breakfast.     losartan (COZAAR) 25 MG tablet Take 1 tablet (25 mg total) by mouth daily. 30 tablet 0   metoprolol tartrate (LOPRESSOR) 25 MG tablet Take 0.5 tablets (12.5 mg total) by mouth 2 (two) times daily. 30 tablet 0   MYRBETRIQ 25 MG TB24 tablet TAKE 1 TABLET(25 MG) BY MOUTH DAILY 30 tablet 5   Netarsudil Dimesylate (RHOPRESSA) 0.02 % SOLN Place 1 drop into both eyes every evening.     nitroGLYCERIN (NITROSTAT) 0.4 MG SL tablet Place 1 tablet (0.4 mg total) under the tongue every 5 (five) minutes as needed for chest pain. 25 tablet 1   polyethylene glycol (MIRALAX) 17 g packet Take 17 g by mouth daily as needed for mild constipation.     senna-docusate (SENOKOT-S) 8.6-50 MG tablet Take 2 tablets by mouth 2 (two) times daily. 30 tablet 0   sertraline (ZOLOFT) 50 MG tablet Take 1 tablet (50 mg total) by mouth at bedtime.        Discharge Medications: Please see discharge summary for a list of discharge medications.  Relevant Imaging Results:  Relevant Lab Results:   Additional Information SSN: [id number]  Susa Simmonds, LCSWA

## 2023-01-23 NOTE — Telephone Encounter (Signed)
Appointment made

## 2023-01-23 NOTE — Evaluation (Signed)
Physical Therapy Evaluation Patient Details Name: Yvonne Bailey MRN: [id number] DOB: 1940/05/26 Today's Date: 01/23/2023  History of Present Illness  Patient is 82 y.o. female presenting emergency department with chief complaint of fatigue.  Patient holding her eyes closed, refusing to talk.  Will withdrawal all extremities to pain.  History of dementia. Workup in ED course largely unremarkable. PMH significant for dementia, UTIs, CAD, s/p PCI, DVT, HTN, icm, lacunar infarct, MI, PAD, CKD3, stroke.   Clinical Impression  Yvonne Bailey is 82 y.o. female admitted with above HPI and diagnosis. Patient is currently limited by functional impairments below (see PT problem list). Patient lives with son and per chart reviewe requires assist at baseline with mobility using RW and with all ADL's. Currently pt required Max assist for bed mobility and was unsafe to progress to OOB/Sit<>Stands from elevated ED stretcher height. Patient will benefit from continued skilled PT interventions to address impairments and progress independence with mobility. Patient will benefit from continued inpatient follow up therapy, <3 hours/day. Acute PT will follow and progress as able.         If plan is discharge home, recommend the following: Two people to help with walking and/or transfers;Two people to help with bathing/dressing/bathroom;Assistance with cooking/housework;Assist for transportation;Help with stairs or ramp for entrance;Supervision due to cognitive status;Direct supervision/assist for medications management   Can travel by private vehicle   No    Equipment Recommendations Other (comment) (defer to venue)  Recommendations for Other Services       Functional Status Assessment Patient has had a recent decline in their functional status and/or demonstrates limited ability to make significant improvements in function in a reasonable and predictable amount of time     Precautions / Restrictions  Precautions Precautions: Fall Restrictions Weight Bearing Restrictions: No      Mobility  Bed Mobility Overal bed mobility: Needs Assistance Bed Mobility: Rolling, Supine to Sit, Sit to Supine Rolling: Max assist, Used rails   Supine to sit: Max assist, HOB elevated Sit to supine: Max assist, HOB elevated        Transfers                   General transfer comment: deferred due to safety concerns    Ambulation/Gait                  Stairs            Wheelchair Mobility     Tilt Bed    Modified Rankin (Stroke Patients Only)       Balance Overall balance assessment: Needs assistance Sitting-balance support: Feet unsupported, Bilateral upper extremity supported Sitting balance-Leahy Scale: Poor   Postural control: Posterior lean                                   Pertinent Vitals/Pain Pain Assessment Pain Assessment: PAINAD Breathing: normal Negative Vocalization: occasional moan/groan, low speech, negative/disapproving quality Facial Expression: smiling or inexpressive Body Language: relaxed Consolability: no need to console PAINAD Score: 1 Pain Location: generalized Pain Descriptors / Indicators: Discomfort Pain Intervention(s): Limited activity within patient's tolerance, Monitored during session, Repositioned    Home Living Family/patient expects to be discharged to:: Private residence Living Arrangements: Children Available Help at Discharge: Family;Available PRN/intermittently;Personal care attendant Type of Home: House Home Access: Stairs to enter Entrance Stairs-Rails: Right Entrance Stairs-Number of Steps: 6   Home Layout: One level Home Equipment:  Rolling Walker (2 wheels);BSC/3in1;Shower seat;Cane - single point Additional Comments: Information from prio admission in chart; pt unable to provide home set up or PLOF. pt has a home aid but not 24/7.    Prior Function Prior Level of Function : Needs  assist             Mobility Comments: ambulates with RW ADLs Comments: has assist with ADLs/IADLs     Extremity/Trunk Assessment   Upper Extremity Assessment Upper Extremity Assessment: Defer to OT evaluation;Generalized weakness    Lower Extremity Assessment Lower Extremity Assessment: Generalized weakness    Cervical / Trunk Assessment Cervical / Trunk Assessment: Kyphotic  Communication   Communication Communication: Difficulty communicating thoughts/reduced clarity of speech Cueing Techniques: Verbal cues;Tactile cues  Cognition Arousal: Alert Behavior During Therapy: WFL for tasks assessed/performed Overall Cognitive Status: History of cognitive impairments - at baseline                                          General Comments      Exercises     Assessment/Plan    PT Assessment Patient needs continued PT services  PT Problem List Decreased strength;Decreased range of motion;Decreased activity tolerance;Decreased balance;Decreased mobility;Decreased cognition;Decreased knowledge of use of DME;Decreased safety awareness;Decreased knowledge of precautions;Pain       PT Treatment Interventions DME instruction;Cognitive remediation;Patient/family education;Neuromuscular re-education;Balance training;Therapeutic exercise;Therapeutic activities;Functional mobility training;Stair training;Gait training    PT Goals (Current goals can be found in the Care Plan section)  Acute Rehab PT Goals Patient Stated Goal: none stated PT Goal Formulation: Patient unable to participate in goal setting Time For Goal Achievement: 02/06/23 Potential to Achieve Goals: Fair    Frequency Min 1X/week     Co-evaluation               AM-PAC PT "6 Clicks" Mobility  Outcome Measure Help needed turning from your back to your side while in a flat bed without using bedrails?: Total Help needed moving from lying on your back to sitting on the side of a flat bed  without using bedrails?: Total Help needed moving to and from a bed to a chair (including a wheelchair)?: Total Help needed standing up from a chair using your arms (e.g., wheelchair or bedside chair)?: Total Help needed to walk in hospital room?: Total Help needed climbing 3-5 steps with a railing? : Total 6 Click Score: 6    End of Session Equipment Utilized During Treatment: Gait belt Activity Tolerance: Patient tolerated treatment well Patient left: in bed;with call bell/phone within reach Nurse Communication: Mobility status PT Visit Diagnosis: Other abnormalities of gait and mobility (R26.89);Muscle weakness (generalized) (M62.81);Difficulty in walking, not elsewhere classified (R26.2)    Time: 0981-1914 PT Time Calculation (min) (ACUTE ONLY): 22 min   Charges:   PT Evaluation $PT Eval Moderate Complexity: 1 Mod   PT General Charges $$ ACUTE PT VISIT: 1 Visit         Wynn Maudlin, DPT Acute Rehabilitation Services Office [phone]  01/23/23 1:27 PM

## 2023-01-23 NOTE — Telephone Encounter (Signed)
Patient will require office visit

## 2023-01-24 LAB — URINE CULTURE

## 2023-01-24 MED ORDER — POTASSIUM CHLORIDE CRYS ER 20 MEQ PO TBCR
20.0000 meq | EXTENDED_RELEASE_TABLET | Freq: Once | ORAL | Status: AC
  Administered 2023-01-24: 20 meq via ORAL
  Filled 2023-01-24: qty 1

## 2023-01-24 MED ORDER — POTASSIUM CHLORIDE CRYS ER 20 MEQ PO TBCR: 40.0000 meq | EXTENDED_RELEASE_TABLET | Freq: Once | ORAL | Status: DC

## 2023-01-24 NOTE — Progress Notes (Signed)
CSW spoke with patients son to let him know that [facility] declined their offer due to patient having a previous balance. CSW told Patients son that [facility] was willing to offer patient a bed. Patients son stated that was too far. CSW also stated she reached out to [facility] but they stated that patient might have to pay daily. CSW told patients son that insurance also has not responded at this time on whether they will cover another SNF stay. CSW asked patients son if insurance approves today would he like his mother discharge to [facility]. Patients son declined the offer and stated he will take patient home and try to get her into a senior care facility during the day. Patients son stated he will pick patient up around 5:30 PM today when he gets off work.

## 2023-01-24 NOTE — Discharge Instructions (Signed)
Follow-up with your primary doctor next week to be rechecked

## 2023-01-24 NOTE — ED Notes (Signed)
Pt cleaned, wet brief changed.  Bed linens clean and dry, brief clean and dry.  Water given with PO meds.

## 2023-01-24 NOTE — ED Provider Notes (Signed)
Emergency Medicine Observation Re-evaluation Note  Yvonne Bailey is a 82 y.o. female, seen on rounds today.  Pt initially presented to the ED for complaints of Fatigue Currently, the patient is sitting in bed watching TV.  Physical Exam  BP (!) 174/79   Pulse 61   Temp 97.9 F (36.6 C) (Oral)   Resp 16   Ht 5\' 7"  (1.702 m)   Wt 81 kg   SpO2 100%   BMI 27.97 kg/m  Physical Exam General: resting Cardiac: well-perfused Lungs: no resp distress Psych: calm, watching TV, NAD  ED Course / MDM  EKG:EKG Interpretation Date/Time:  Wednesday January 22 2023 19:19:34 EST Ventricular Rate:  64 PR Interval:    QRS Duration:  111 QT Interval:  444 QTC Calculation: 459 R Axis:   -16  Text Interpretation: Atrial fibrillation LVH with secondary repolarization abnormality Anterior Q waves, possibly due to LVH Confirmed by Estanislado Pandy [phone]) on 01/22/2023 10:46:50 PM  I have reviewed the labs performed to date as well as medications administered while in observation.  Recent changes in the last 24 hours include none.  Plan  Current plan is for TOC placement.    Yvonne Rough, MD 01/24/23 [phone]

## 2023-01-24 NOTE — ED Notes (Signed)
Breakfast tray provided, pt feeding self.

## 2023-01-24 NOTE — ED Provider Notes (Signed)
Pt seen by Dr Jearld Fenton this am.  Please see her note.  Pt was holding in the ED for possible NH placement.  TOC found a facility that will accept pt, family declines sue to distance.  They will take pt home and try to find placement.   Yvonne Dibbles, MD 01/24/23 [phone]

## 2023-01-24 NOTE — ED Notes (Signed)
Lunch tray provided. 

## 2023-01-26 ENCOUNTER — Encounter: Payer: Self-pay | Admitting: Internal Medicine

## 2023-01-26 NOTE — Patient Instructions (Signed)
      Blood work was ordered.   The lab is on the first floor.    Medications changes include :       A referral was ordered and someone will call you to schedule an appointment.     No follow-ups on file.  

## 2023-01-26 NOTE — Progress Notes (Signed)
      Subjective:    Patient ID: Yvonne Bailey, female    DOB: 12-01-1940, 82 y.o.   MRN: [id number]     HPI Yvonne Bailey is here for follow up of her chronic medical problems.  ?  Taking metoprolol for 0.5 twice daily  Medications and allergies reviewed with patient and updated if appropriate.  Current Outpatient Medications on File Prior to Visit  Medication Sig Dispense Refill  . aspirin EC 81 MG tablet Take 81 mg by mouth daily. Swallow whole.    . dorzolamide-timolol (COSOPT) 2-0.5 % ophthalmic solution Place 1 drop into both eyes in the morning, at noon, and at bedtime.    Marland Kitchen ELIQUIS 5 MG TABS tablet TAKE 1 TABLET(5 MG) BY MOUTH TWICE DAILY 180 tablet 0  . furosemide (LASIX) 20 MG tablet Take 1 tablet (20 mg total) by mouth daily. (Patient taking differently: Take 20 mg by mouth daily as needed for edema.) 30 tablet 0  . levothyroxine (SYNTHROID) 100 MCG tablet Take 100 mcg by mouth daily before breakfast.    . MYRBETRIQ 25 MG TB24 tablet TAKE 1 TABLET(25 MG) BY MOUTH DAILY 30 tablet 5  . Netarsudil Dimesylate (RHOPRESSA) 0.02 % SOLN Place 1 drop into both eyes every evening.    . nitroGLYCERIN (NITROSTAT) 0.4 MG SL tablet Place 1 tablet (0.4 mg total) under the tongue every 5 (five) minutes as needed for chest pain. 25 tablet 1  . polyethylene glycol (MIRALAX) 17 g packet Take 17 g by mouth daily as needed for mild constipation.    . senna-docusate (SENOKOT-S) 8.6-50 MG tablet Take 2 tablets by mouth 2 (two) times daily. 30 tablet 0   No current facility-administered medications on file prior to visit.     Review of Systems     Objective:  There were no vitals filed for this visit. BP Readings from Last 3 Encounters:  01/31/23 126/78  01/24/23 (!) 143/69  12/12/22 (!) 161/64   Wt Readings from Last 3 Encounters:  01/31/23 177 lb (80.3 kg)  01/22/23 178 lb 9.2 oz (81 kg)  12/12/22 178 lb 9.2 oz (81 kg)   There is no height or weight on file to calculate BMI.     Physical Exam     Lab Results  Component Value Date   WBC 7.1 01/22/2023   HGB 13.4 01/22/2023   HCT 41.0 01/22/2023   PLT 239 01/22/2023   GLUCOSE 89 01/22/2023   CHOL 104 11/24/2022   TRIG 39 11/24/2022   HDL 41 11/24/2022   LDLCALC 55 11/24/2022   ALT 16 01/22/2023   AST 25 01/22/2023   NA 140 01/22/2023   K 3.4 (L) 01/22/2023   CL 105 01/22/2023   CREATININE 0.92 01/22/2023   BUN 12 01/22/2023   CO2 26 01/22/2023   TSH 0.341 (L) 11/23/2022   INR 1.2 12/12/2022   HGBA1C 5.3 11/24/2022     Assessment & Plan:    See Problem List for Assessment and Plan of chronic medical problems.    This encounter was created in error - please disregard.

## 2023-01-27 ENCOUNTER — Encounter: Payer: Medicare HMO | Admitting: Internal Medicine

## 2023-01-27 ENCOUNTER — Telehealth: Payer: Self-pay | Admitting: *Deleted

## 2023-01-27 ENCOUNTER — Telehealth: Payer: Self-pay | Admitting: Internal Medicine

## 2023-01-27 DIAGNOSIS — I482 Chronic atrial fibrillation, unspecified: Secondary | ICD-10-CM

## 2023-01-27 DIAGNOSIS — F32A Depression, unspecified: Secondary | ICD-10-CM

## 2023-01-27 DIAGNOSIS — N3281 Overactive bladder: Secondary | ICD-10-CM

## 2023-01-27 DIAGNOSIS — E039 Hypothyroidism, unspecified: Secondary | ICD-10-CM

## 2023-01-27 DIAGNOSIS — E785 Hyperlipidemia, unspecified: Secondary | ICD-10-CM

## 2023-01-27 DIAGNOSIS — R600 Localized edema: Secondary | ICD-10-CM

## 2023-01-27 DIAGNOSIS — I69320 Aphasia following cerebral infarction: Secondary | ICD-10-CM

## 2023-01-27 DIAGNOSIS — F03B0 Unspecified dementia, moderate, without behavioral disturbance, psychotic disturbance, mood disturbance, and anxiety: Secondary | ICD-10-CM

## 2023-01-27 DIAGNOSIS — I10 Essential (primary) hypertension: Secondary | ICD-10-CM

## 2023-01-27 DIAGNOSIS — N1831 Chronic kidney disease, stage 3a: Secondary | ICD-10-CM

## 2023-01-27 DIAGNOSIS — R7303 Prediabetes: Secondary | ICD-10-CM

## 2023-01-27 DIAGNOSIS — I2510 Atherosclerotic heart disease of native coronary artery without angina pectoris: Secondary | ICD-10-CM

## 2023-01-27 DIAGNOSIS — I5022 Chronic systolic (congestive) heart failure: Secondary | ICD-10-CM

## 2023-01-27 NOTE — Assessment & Plan Note (Signed)
Chronic  Clinically euthyroid Check tsh and will titrate med dose if needed Currently taking levothyroxine 100 mcg daily

## 2023-01-27 NOTE — Assessment & Plan Note (Signed)
Chronic Blood pressure well controlled Continue aspirin 81 mg daily, Eliquis 5 mg twice daily and atorvastatin 80 mg daily Stressed importance of taking her medication as prescribed

## 2023-01-27 NOTE — Telephone Encounter (Signed)
Pt son called wanting to start [facility] for his mother at [facility]. Pt is coming in Friday to discuss more. Please advise.

## 2023-01-27 NOTE — Assessment & Plan Note (Signed)
Chronic Currently not following with neurology Discussed possible referral for formal evaluation and to consider medication

## 2023-01-27 NOTE — Assessment & Plan Note (Signed)
Chronic Denies symptoms suggestive of angina On Eliquis, aspirin, Lipitor

## 2023-01-27 NOTE — Assessment & Plan Note (Signed)
Chronic Has 1+ edema in b/l LE Continue lasix 20 mg qd prn

## 2023-01-27 NOTE — Telephone Encounter (Signed)
Son called regarding home health services for his mom.  RNCM reviewed chart and found that son's plan was to get his mom enrolled in adult day program which would discount need for home health services.  RNCM advised that they will need to go through pt PCP to obtain home health orders.  Son verbalized understanding to contact PCP for home health orders.

## 2023-01-27 NOTE — Telephone Encounter (Signed)
We can not start any services until she is seen.  She no-showed appointment today and son has been made aware from previous phone call that we must see her in office.

## 2023-01-27 NOTE — Assessment & Plan Note (Signed)
Chronic Lab Results  Component Value Date   HGBA1C 5.3 11/24/2022   Low sugar / carb diet Stressed regular exercise

## 2023-01-27 NOTE — Assessment & Plan Note (Signed)
Chronic Currently taking Myrbetriq 25 mg daily-we will continue

## 2023-01-27 NOTE — Assessment & Plan Note (Signed)
Chronic Following with cardiology Asymptomatic On Eliquis 5 mg twice daily, rate controlled

## 2023-01-27 NOTE — Assessment & Plan Note (Signed)
Chronic Regular exercise and healthy diet encouraged Lab Results  Component Value Date   LDLCALC 55 11/24/2022   Continue atorvastatin 40 mg daily

## 2023-01-27 NOTE — Assessment & Plan Note (Addendum)
Chronic She does have some leg edema which is likely dependent in nature-she appears to be euvolemic Follows with cardiology Continue furosemide 20 mg daily as needed

## 2023-01-27 NOTE — Assessment & Plan Note (Signed)
Chronic Controlled, Stable Continue sertraline 50 mg daily 

## 2023-01-27 NOTE — Assessment & Plan Note (Signed)
Chronic Blood pressure well controlled CMP Continue losartan 25 mg daily, metoprolol 12.5 mg twice daily

## 2023-01-27 NOTE — Assessment & Plan Note (Signed)
Chronic Kidney function stable

## 2023-01-30 ENCOUNTER — Encounter: Payer: Self-pay | Admitting: Internal Medicine

## 2023-01-30 NOTE — Patient Instructions (Addendum)
       Medications changes include :   sertraline 100 mg daily    A referral was ordered home PT and someone will call you to schedule an appointment.     Return in about 6 months (around 07/31/2023) for follow up.

## 2023-01-30 NOTE — Progress Notes (Signed)
Subjective:    Patient ID: Yvonne Bailey, female    DOB: [birth year], 82 y.o.   MRN: [id number]     HPI Yvonne Bailey is here for follow up of her chronic medical problems.  She is here today with her son.  He helps with the history because of her dementia  Has been taking her thyroid medication too close to breakfast but for a couple of weeks she has been consistent waiting 30 min prior to eating.    Medications and allergies reviewed with patient and updated if appropriate.  Current Outpatient Medications on File Prior to Visit  Medication Sig Dispense Refill   aspirin EC 81 MG tablet Take 81 mg by mouth daily. Swallow whole.     dorzolamide-timolol (COSOPT) 2-0.5 % ophthalmic solution Place 1 drop into both eyes in the morning, at noon, and at bedtime.     ELIQUIS 5 MG TABS tablet TAKE 1 TABLET(5 MG) BY MOUTH TWICE DAILY 180 tablet 0   furosemide (LASIX) 20 MG tablet Take 1 tablet (20 mg total) by mouth daily. (Patient taking differently: Take 20 mg by mouth daily as needed for edema.) 30 tablet 0   levothyroxine (SYNTHROID) 100 MCG tablet Take 100 mcg by mouth daily before breakfast.     MYRBETRIQ 25 MG TB24 tablet TAKE 1 TABLET(25 MG) BY MOUTH DAILY 30 tablet 5   Netarsudil Dimesylate (RHOPRESSA) 0.02 % SOLN Place 1 drop into both eyes every evening.     nitroGLYCERIN (NITROSTAT) 0.4 MG SL tablet Place 1 tablet (0.4 mg total) under the tongue every 5 (five) minutes as needed for chest pain. 25 tablet 1   polyethylene glycol (MIRALAX) 17 g packet Take 17 g by mouth daily as needed for mild constipation.     senna-docusate (SENOKOT-S) 8.6-50 MG tablet Take 2 tablets by mouth 2 (two) times daily. 30 tablet 0   No current facility-administered medications on file prior to visit.     Review of Systems  Constitutional:  Negative for fever.  Respiratory:  Positive for shortness of breath (with moderate exertion). Negative for cough and wheezing.   Cardiovascular:  Positive for  leg swelling. Negative for chest pain and palpitations.  Gastrointestinal:  Negative for abdominal pain, blood in stool, constipation and diarrhea.  Genitourinary:  Positive for frequency and hematuria (occ from renal stone - has seen urology). Negative for dysuria.  Skin:  Negative for rash.  Neurological:  Negative for dizziness, light-headedness and headaches.  Psychiatric/Behavioral:  Positive for agitation (sometimes), confusion (sometimes) and dysphoric mood. Negative for hallucinations. The patient is nervous/anxious.        Objective:   Vitals:   01/31/23 1407 01/31/23 1442  BP: (!) 132/100 126/78  Pulse: 80   Temp: 98.4 F (36.9 C)   SpO2: 97%    BP Readings from Last 3 Encounters:  01/31/23 126/78  01/24/23 (!) 143/69  12/12/22 (!) 161/64   Wt Readings from Last 3 Encounters:  01/31/23 177 lb (80.3 kg)  01/22/23 178 lb 9.2 oz (81 kg)  12/12/22 178 lb 9.2 oz (81 kg)   Body mass index is 27.72 kg/m.    Physical Exam Constitutional:      General: She is not in acute distress.    Appearance: Normal appearance.  HENT:     Head: Normocephalic and atraumatic.  Eyes:     Conjunctiva/sclera: Conjunctivae normal.  Cardiovascular:     Rate and Rhythm: Normal rate and regular rhythm.  Heart sounds: Normal heart sounds.  Pulmonary:     Effort: Pulmonary effort is normal. No respiratory distress.     Breath sounds: Normal breath sounds. No wheezing.  Musculoskeletal:     Cervical back: Neck supple.     Right lower leg: Edema (Trace) present.     Left lower leg: Edema (Trace) present.  Lymphadenopathy:     Cervical: No cervical adenopathy.  Skin:    General: Skin is warm and dry.     Findings: No rash.  Neurological:     Mental Status: She is alert. Mental status is at baseline.  Psychiatric:        Mood and Affect: Mood normal.        Behavior: Behavior normal.        Lab Results  Component Value Date   WBC 7.1 01/22/2023   HGB 13.4 01/22/2023   HCT  41.0 01/22/2023   PLT 239 01/22/2023   GLUCOSE 89 01/22/2023   CHOL 104 11/24/2022   TRIG 39 11/24/2022   HDL 41 11/24/2022   LDLCALC 55 11/24/2022   ALT 16 01/22/2023   AST 25 01/22/2023   NA 140 01/22/2023   K 3.4 (L) 01/22/2023   CL 105 01/22/2023   CREATININE 0.92 01/22/2023   BUN 12 01/22/2023   CO2 26 01/22/2023   TSH 0.341 (L) 11/23/2022   INR 1.2 12/12/2022   HGBA1C 5.3 11/24/2022     Assessment & Plan:    See Problem List for Assessment and Plan of chronic medical problems.

## 2023-01-31 ENCOUNTER — Ambulatory Visit: Payer: Medicare HMO | Admitting: Internal Medicine

## 2023-01-31 VITALS — BP 126/78 | HR 80 | Temp 98.4°F | Ht 67.0 in | Wt 177.0 lb

## 2023-01-31 DIAGNOSIS — I2510 Atherosclerotic heart disease of native coronary artery without angina pectoris: Secondary | ICD-10-CM

## 2023-01-31 DIAGNOSIS — E785 Hyperlipidemia, unspecified: Secondary | ICD-10-CM

## 2023-01-31 DIAGNOSIS — F419 Anxiety disorder, unspecified: Secondary | ICD-10-CM

## 2023-01-31 DIAGNOSIS — I482 Chronic atrial fibrillation, unspecified: Secondary | ICD-10-CM | POA: Diagnosis not present

## 2023-01-31 DIAGNOSIS — R5381 Other malaise: Secondary | ICD-10-CM

## 2023-01-31 DIAGNOSIS — F03B0 Unspecified dementia, moderate, without behavioral disturbance, psychotic disturbance, mood disturbance, and anxiety: Secondary | ICD-10-CM | POA: Diagnosis not present

## 2023-01-31 DIAGNOSIS — N1831 Chronic kidney disease, stage 3a: Secondary | ICD-10-CM

## 2023-01-31 DIAGNOSIS — I5022 Chronic systolic (congestive) heart failure: Secondary | ICD-10-CM | POA: Diagnosis not present

## 2023-01-31 DIAGNOSIS — E039 Hypothyroidism, unspecified: Secondary | ICD-10-CM

## 2023-01-31 DIAGNOSIS — I69320 Aphasia following cerebral infarction: Secondary | ICD-10-CM

## 2023-01-31 DIAGNOSIS — R600 Localized edema: Secondary | ICD-10-CM

## 2023-01-31 DIAGNOSIS — F32A Depression, unspecified: Secondary | ICD-10-CM

## 2023-01-31 DIAGNOSIS — I10 Essential (primary) hypertension: Secondary | ICD-10-CM | POA: Diagnosis not present

## 2023-01-31 DIAGNOSIS — N3281 Overactive bladder: Secondary | ICD-10-CM

## 2023-01-31 DIAGNOSIS — R7303 Prediabetes: Secondary | ICD-10-CM

## 2023-01-31 DIAGNOSIS — R2689 Other abnormalities of gait and mobility: Secondary | ICD-10-CM

## 2023-01-31 MED ORDER — METOPROLOL TARTRATE 25 MG PO TABS: 12.5000 mg | ORAL_TABLET | Freq: Two times a day (BID) | ORAL | 1 refills | Status: AC

## 2023-01-31 MED ORDER — ATORVASTATIN CALCIUM 40 MG PO TABS: 80.0000 mg | ORAL_TABLET | Freq: Every day | ORAL | 1 refills | Status: DC

## 2023-01-31 MED ORDER — LOSARTAN POTASSIUM 25 MG PO TABS: 25.0000 mg | ORAL_TABLET | Freq: Every day | ORAL | 1 refills | Status: AC

## 2023-01-31 MED ORDER — SERTRALINE HCL 100 MG PO TABS: 100.0000 mg | ORAL_TABLET | Freq: Every day | ORAL | 1 refills | Status: AC

## 2023-01-31 NOTE — Assessment & Plan Note (Addendum)
Chronic Has minimal bilateral lower extremity edema Continue lasix 20 mg qd prn

## 2023-01-31 NOTE — Assessment & Plan Note (Signed)
Chronic Euvolemic Follows with cardiology Continue furosemide 20 mg daily as needed

## 2023-01-31 NOTE — Assessment & Plan Note (Signed)
Chronic Regular exercise and healthy diet encouraged Lab Results  Component Value Date   LDLCALC 55 11/24/2022   Continue atorvastatin 40 mg daily

## 2023-01-31 NOTE — Assessment & Plan Note (Signed)
Chronic Blood pressure well controlled when rechecked Continue aspirin 81 mg daily, Eliquis 5 mg twice daily and atorvastatin 80 mg daily She has been compliant with medications

## 2023-01-31 NOTE — Assessment & Plan Note (Signed)
Chronic Denies symptoms suggestive of angina On Eliquis, aspirin, Lipitor

## 2023-01-31 NOTE — Assessment & Plan Note (Signed)
Chronic Has gotten worse related to recent hospitalization Would benefit from PT-Home PT ordered

## 2023-01-31 NOTE — Assessment & Plan Note (Signed)
Chronic Currently not following with neurology Fairly stable Does have some agitation and confusion at times

## 2023-01-31 NOTE — Assessment & Plan Note (Addendum)
Chronic Not ideally controlled Will increase sertraline back to 100 mg which was better controlling her emotions

## 2023-01-31 NOTE — Assessment & Plan Note (Signed)
Chronic  Clinically euthyroid Has not always been consistently taking the thyroid medication 30 minutes before food and other medications-has started doing this and has been consistent with that for the past 2 weeks Since the blood work today would not be very accurate we will hold off on checking blood work Currently taking levothyroxine 100 mcg daily-continue

## 2023-01-31 NOTE — Assessment & Plan Note (Signed)
Chronic Currently taking Myrbetriq 25 mg daily

## 2023-01-31 NOTE — Assessment & Plan Note (Signed)
Chronic Kidney function stable

## 2023-01-31 NOTE — Assessment & Plan Note (Signed)
Chronic Following with cardiology Asymptomatic On Eliquis 5 mg twice daily, rate controlled

## 2023-01-31 NOTE — Assessment & Plan Note (Signed)
Chronic Lab Results  Component Value Date   HGBA1C 5.3 11/24/2022   Low sugar / carb diet Stressed regular exercise

## 2023-01-31 NOTE — Assessment & Plan Note (Signed)
Chronic Blood pressure  Continue losartan 25 mg daily Has not been taking metoprolol 12.5 mg twice daily - will start

## 2023-02-04 DIAGNOSIS — F02B3 Dementia in other diseases classified elsewhere, moderate, with mood disturbance: Secondary | ICD-10-CM | POA: Diagnosis not present

## 2023-02-04 DIAGNOSIS — E039 Hypothyroidism, unspecified: Secondary | ICD-10-CM | POA: Diagnosis not present

## 2023-02-04 DIAGNOSIS — N1831 Chronic kidney disease, stage 3a: Secondary | ICD-10-CM | POA: Diagnosis not present

## 2023-02-04 DIAGNOSIS — I69320 Aphasia following cerebral infarction: Secondary | ICD-10-CM | POA: Diagnosis not present

## 2023-02-04 DIAGNOSIS — F02B11 Dementia in other diseases classified elsewhere, moderate, with agitation: Secondary | ICD-10-CM | POA: Diagnosis not present

## 2023-02-04 DIAGNOSIS — F32A Depression, unspecified: Secondary | ICD-10-CM | POA: Diagnosis not present

## 2023-02-04 DIAGNOSIS — F02B4 Dementia in other diseases classified elsewhere, moderate, with anxiety: Secondary | ICD-10-CM | POA: Diagnosis not present

## 2023-02-04 DIAGNOSIS — I130 Hypertensive heart and chronic kidney disease with heart failure and stage 1 through stage 4 chronic kidney disease, or unspecified chronic kidney disease: Secondary | ICD-10-CM | POA: Diagnosis not present

## 2023-02-04 DIAGNOSIS — I5022 Chronic systolic (congestive) heart failure: Secondary | ICD-10-CM | POA: Diagnosis not present

## 2023-02-05 ENCOUNTER — Telehealth: Payer: Self-pay | Admitting: Internal Medicine

## 2023-02-05 NOTE — Telephone Encounter (Signed)
Message left today with verbals.

## 2023-02-05 NOTE — Telephone Encounter (Signed)
Okay for orders?

## 2023-02-05 NOTE — Telephone Encounter (Signed)
HH ORDERS   Caller Name: [facility] Agency Name: [facility] [employer] Phone #: [phone](secure)  Service Requested: PT (examples: OT/PT/Skilled Nursing/Social Work/Speech Therapy/Wound Care)  Frequency of Visits: 1X a week for 1 week, 2X a week for 4 weeks, 1X a week for 4 weeks

## 2023-02-10 ENCOUNTER — Telehealth: Payer: Self-pay | Admitting: Internal Medicine

## 2023-02-10 NOTE — Telephone Encounter (Signed)
[facility] - [facility] - 4th or last page of physical did not get signed - please resend

## 2023-02-11 ENCOUNTER — Other Ambulatory Visit: Payer: Self-pay | Admitting: Internal Medicine

## 2023-02-11 DIAGNOSIS — F02B3 Dementia in other diseases classified elsewhere, moderate, with mood disturbance: Secondary | ICD-10-CM | POA: Diagnosis not present

## 2023-02-11 DIAGNOSIS — Z0279 Encounter for issue of other medical certificate: Secondary | ICD-10-CM

## 2023-02-11 DIAGNOSIS — N1831 Chronic kidney disease, stage 3a: Secondary | ICD-10-CM | POA: Diagnosis not present

## 2023-02-11 DIAGNOSIS — F02B11 Dementia in other diseases classified elsewhere, moderate, with agitation: Secondary | ICD-10-CM | POA: Diagnosis not present

## 2023-02-11 DIAGNOSIS — I69320 Aphasia following cerebral infarction: Secondary | ICD-10-CM | POA: Diagnosis not present

## 2023-02-11 DIAGNOSIS — I5022 Chronic systolic (congestive) heart failure: Secondary | ICD-10-CM | POA: Diagnosis not present

## 2023-02-11 DIAGNOSIS — F32A Depression, unspecified: Secondary | ICD-10-CM | POA: Diagnosis not present

## 2023-02-11 DIAGNOSIS — F02B4 Dementia in other diseases classified elsewhere, moderate, with anxiety: Secondary | ICD-10-CM | POA: Diagnosis not present

## 2023-02-11 DIAGNOSIS — E039 Hypothyroidism, unspecified: Secondary | ICD-10-CM | POA: Diagnosis not present

## 2023-02-11 DIAGNOSIS — I130 Hypertensive heart and chronic kidney disease with heart failure and stage 1 through stage 4 chronic kidney disease, or unspecified chronic kidney disease: Secondary | ICD-10-CM | POA: Diagnosis not present

## 2023-02-11 NOTE — Telephone Encounter (Signed)
Refaxed last page today.

## 2023-02-11 NOTE — Telephone Encounter (Signed)
Form located and placed back in Dr. Lawerance Bach folder to sign.

## 2023-02-13 ENCOUNTER — Telehealth: Payer: Self-pay

## 2023-02-13 DIAGNOSIS — N1831 Chronic kidney disease, stage 3a: Secondary | ICD-10-CM | POA: Diagnosis not present

## 2023-02-13 DIAGNOSIS — E039 Hypothyroidism, unspecified: Secondary | ICD-10-CM | POA: Diagnosis not present

## 2023-02-13 DIAGNOSIS — I69320 Aphasia following cerebral infarction: Secondary | ICD-10-CM | POA: Diagnosis not present

## 2023-02-13 DIAGNOSIS — I5022 Chronic systolic (congestive) heart failure: Secondary | ICD-10-CM | POA: Diagnosis not present

## 2023-02-13 DIAGNOSIS — I130 Hypertensive heart and chronic kidney disease with heart failure and stage 1 through stage 4 chronic kidney disease, or unspecified chronic kidney disease: Secondary | ICD-10-CM | POA: Diagnosis not present

## 2023-02-13 DIAGNOSIS — F02B3 Dementia in other diseases classified elsewhere, moderate, with mood disturbance: Secondary | ICD-10-CM | POA: Diagnosis not present

## 2023-02-13 DIAGNOSIS — F32A Depression, unspecified: Secondary | ICD-10-CM | POA: Diagnosis not present

## 2023-02-13 DIAGNOSIS — F02B4 Dementia in other diseases classified elsewhere, moderate, with anxiety: Secondary | ICD-10-CM | POA: Diagnosis not present

## 2023-02-13 DIAGNOSIS — F02B11 Dementia in other diseases classified elsewhere, moderate, with agitation: Secondary | ICD-10-CM | POA: Diagnosis not present

## 2023-02-13 NOTE — Telephone Encounter (Signed)
Transition Care Management Unsuccessful Follow-up Telephone Call  Date of discharge and from where:  [facility] 11/15  Attempts:  1st Attempt  Reason for unsuccessful TCM follow-up call:  No answer/busy   Lenard Forth [place]  [facility], [facility] Guide, Phone: [phone] Website: [email or web address]

## 2023-02-14 ENCOUNTER — Telehealth: Payer: Self-pay

## 2023-02-14 NOTE — Telephone Encounter (Signed)
 Transition Care Management Unsuccessful Follow-up Telephone Call  Date of discharge and from where:  [facility] 11/15  Attempts:  2nd Attempt  Reason for unsuccessful TCM follow-up call:  No answer/busy   Lenard Forth Guide [place]  [facility], [facility] Guide, Phone: [phone] Website: [email or web address]

## 2023-02-17 DIAGNOSIS — I2510 Atherosclerotic heart disease of native coronary artery without angina pectoris: Secondary | ICD-10-CM

## 2023-02-17 DIAGNOSIS — I480 Paroxysmal atrial fibrillation: Secondary | ICD-10-CM

## 2023-02-17 DIAGNOSIS — I130 Hypertensive heart and chronic kidney disease with heart failure and stage 1 through stage 4 chronic kidney disease, or unspecified chronic kidney disease: Secondary | ICD-10-CM | POA: Diagnosis not present

## 2023-02-17 DIAGNOSIS — F32A Depression, unspecified: Secondary | ICD-10-CM | POA: Diagnosis not present

## 2023-02-17 DIAGNOSIS — F02811 Dementia in other diseases classified elsewhere, unspecified severity, with agitation: Secondary | ICD-10-CM | POA: Diagnosis not present

## 2023-02-17 DIAGNOSIS — F0284 Dementia in other diseases classified elsewhere, unspecified severity, with anxiety: Secondary | ICD-10-CM | POA: Diagnosis not present

## 2023-02-17 DIAGNOSIS — N1831 Chronic kidney disease, stage 3a: Secondary | ICD-10-CM | POA: Diagnosis not present

## 2023-02-17 DIAGNOSIS — E785 Hyperlipidemia, unspecified: Secondary | ICD-10-CM

## 2023-02-17 DIAGNOSIS — I5022 Chronic systolic (congestive) heart failure: Secondary | ICD-10-CM | POA: Diagnosis not present

## 2023-02-17 DIAGNOSIS — F0283 Dementia in other diseases classified elsewhere, unspecified severity, with mood disturbance: Secondary | ICD-10-CM | POA: Diagnosis not present

## 2023-02-17 DIAGNOSIS — I69320 Aphasia following cerebral infarction: Secondary | ICD-10-CM | POA: Diagnosis not present

## 2023-02-17 DIAGNOSIS — E039 Hypothyroidism, unspecified: Secondary | ICD-10-CM | POA: Diagnosis not present

## 2023-02-18 ENCOUNTER — Other Ambulatory Visit: Payer: Self-pay | Admitting: Internal Medicine

## 2023-02-18 DIAGNOSIS — I739 Peripheral vascular disease, unspecified: Secondary | ICD-10-CM

## 2023-02-20 ENCOUNTER — Telehealth: Payer: Self-pay | Admitting: Internal Medicine

## 2023-02-20 DIAGNOSIS — F02B4 Dementia in other diseases classified elsewhere, moderate, with anxiety: Secondary | ICD-10-CM | POA: Diagnosis not present

## 2023-02-20 DIAGNOSIS — I5022 Chronic systolic (congestive) heart failure: Secondary | ICD-10-CM | POA: Diagnosis not present

## 2023-02-20 DIAGNOSIS — F02B11 Dementia in other diseases classified elsewhere, moderate, with agitation: Secondary | ICD-10-CM | POA: Diagnosis not present

## 2023-02-20 DIAGNOSIS — F32A Depression, unspecified: Secondary | ICD-10-CM | POA: Diagnosis not present

## 2023-02-20 DIAGNOSIS — I69320 Aphasia following cerebral infarction: Secondary | ICD-10-CM | POA: Diagnosis not present

## 2023-02-20 DIAGNOSIS — E039 Hypothyroidism, unspecified: Secondary | ICD-10-CM | POA: Diagnosis not present

## 2023-02-20 DIAGNOSIS — F02B3 Dementia in other diseases classified elsewhere, moderate, with mood disturbance: Secondary | ICD-10-CM | POA: Diagnosis not present

## 2023-02-20 DIAGNOSIS — I130 Hypertensive heart and chronic kidney disease with heart failure and stage 1 through stage 4 chronic kidney disease, or unspecified chronic kidney disease: Secondary | ICD-10-CM | POA: Diagnosis not present

## 2023-02-20 DIAGNOSIS — N1831 Chronic kidney disease, stage 3a: Secondary | ICD-10-CM | POA: Diagnosis not present

## 2023-02-20 NOTE — Telephone Encounter (Signed)
Patient's son would like to know if patient could be tried on [employer] - Please call patient's son - [phone]

## 2023-02-21 ENCOUNTER — Emergency Department (HOSPITAL_COMMUNITY)
Admission: EM | Admit: 2023-02-21 | Discharge: 2023-02-26 | Disposition: A | Discharge status: Skilled Nursing Facility | Payer: Medicare HMO | Attending: Emergency Medicine | Admitting: Emergency Medicine

## 2023-02-21 ENCOUNTER — Emergency Department (HOSPITAL_COMMUNITY): Payer: Medicare HMO

## 2023-02-21 ENCOUNTER — Other Ambulatory Visit: Payer: Self-pay

## 2023-02-21 ENCOUNTER — Encounter (HOSPITAL_COMMUNITY): Payer: Self-pay

## 2023-02-21 DIAGNOSIS — M6281 Muscle weakness (generalized): Secondary | ICD-10-CM | POA: Insufficient documentation

## 2023-02-21 DIAGNOSIS — N189 Chronic kidney disease, unspecified: Secondary | ICD-10-CM | POA: Insufficient documentation

## 2023-02-21 DIAGNOSIS — I130 Hypertensive heart and chronic kidney disease with heart failure and stage 1 through stage 4 chronic kidney disease, or unspecified chronic kidney disease: Secondary | ICD-10-CM | POA: Diagnosis not present

## 2023-02-21 DIAGNOSIS — Z79899 Other long term (current) drug therapy: Secondary | ICD-10-CM | POA: Insufficient documentation

## 2023-02-21 DIAGNOSIS — F0390 Unspecified dementia without behavioral disturbance: Secondary | ICD-10-CM | POA: Diagnosis not present

## 2023-02-21 DIAGNOSIS — F32A Depression, unspecified: Secondary | ICD-10-CM | POA: Diagnosis not present

## 2023-02-21 DIAGNOSIS — N3001 Acute cystitis with hematuria: Secondary | ICD-10-CM | POA: Insufficient documentation

## 2023-02-21 DIAGNOSIS — I69320 Aphasia following cerebral infarction: Secondary | ICD-10-CM | POA: Diagnosis not present

## 2023-02-21 DIAGNOSIS — I6529 Occlusion and stenosis of unspecified carotid artery: Secondary | ICD-10-CM | POA: Diagnosis not present

## 2023-02-21 DIAGNOSIS — S199XXA Unspecified injury of neck, initial encounter: Secondary | ICD-10-CM | POA: Diagnosis not present

## 2023-02-21 DIAGNOSIS — Z7901 Long term (current) use of anticoagulants: Secondary | ICD-10-CM | POA: Diagnosis not present

## 2023-02-21 DIAGNOSIS — S8991XA Unspecified injury of right lower leg, initial encounter: Secondary | ICD-10-CM | POA: Diagnosis not present

## 2023-02-21 DIAGNOSIS — Z7982 Long term (current) use of aspirin: Secondary | ICD-10-CM | POA: Diagnosis not present

## 2023-02-21 DIAGNOSIS — S0990XA Unspecified injury of head, initial encounter: Secondary | ICD-10-CM | POA: Diagnosis not present

## 2023-02-21 DIAGNOSIS — R8290 Unspecified abnormal findings in urine: Secondary | ICD-10-CM | POA: Diagnosis present

## 2023-02-21 DIAGNOSIS — R2689 Other abnormalities of gait and mobility: Secondary | ICD-10-CM | POA: Insufficient documentation

## 2023-02-21 DIAGNOSIS — I10 Essential (primary) hypertension: Secondary | ICD-10-CM | POA: Diagnosis not present

## 2023-02-21 DIAGNOSIS — I4891 Unspecified atrial fibrillation: Secondary | ICD-10-CM | POA: Diagnosis not present

## 2023-02-21 DIAGNOSIS — I509 Heart failure, unspecified: Secondary | ICD-10-CM | POA: Diagnosis not present

## 2023-02-21 DIAGNOSIS — R0689 Other abnormalities of breathing: Secondary | ICD-10-CM | POA: Diagnosis not present

## 2023-02-21 DIAGNOSIS — N1831 Chronic kidney disease, stage 3a: Secondary | ICD-10-CM | POA: Diagnosis not present

## 2023-02-21 DIAGNOSIS — F02B4 Dementia in other diseases classified elsewhere, moderate, with anxiety: Secondary | ICD-10-CM | POA: Diagnosis not present

## 2023-02-21 DIAGNOSIS — F02B3 Dementia in other diseases classified elsewhere, moderate, with mood disturbance: Secondary | ICD-10-CM | POA: Diagnosis not present

## 2023-02-21 DIAGNOSIS — S299XXA Unspecified injury of thorax, initial encounter: Secondary | ICD-10-CM | POA: Diagnosis not present

## 2023-02-21 DIAGNOSIS — F02B11 Dementia in other diseases classified elsewhere, moderate, with agitation: Secondary | ICD-10-CM | POA: Diagnosis not present

## 2023-02-21 DIAGNOSIS — E039 Hypothyroidism, unspecified: Secondary | ICD-10-CM | POA: Diagnosis not present

## 2023-02-21 DIAGNOSIS — I5022 Chronic systolic (congestive) heart failure: Secondary | ICD-10-CM | POA: Diagnosis not present

## 2023-02-21 DIAGNOSIS — S8992XA Unspecified injury of left lower leg, initial encounter: Secondary | ICD-10-CM | POA: Diagnosis not present

## 2023-02-21 DIAGNOSIS — S3993XA Unspecified injury of pelvis, initial encounter: Secondary | ICD-10-CM | POA: Diagnosis not present

## 2023-02-21 LAB — COMPREHENSIVE METABOLIC PANEL
ALT: 15 U/L (ref 0–44)
AST: 32 U/L (ref 15–41)
Albumin: 3.5 g/dL (ref 3.5–5.0)
Alkaline Phosphatase: 122 U/L (ref 38–126)
Anion gap: 9 (ref 5–15)
BUN: 10 mg/dL (ref 8–23)
CO2: 27 mmol/L (ref 22–32)
Calcium: 9 mg/dL (ref 8.9–10.3)
Chloride: 104 mmol/L (ref 98–111)
Creatinine, Ser: 1.13 mg/dL — ABNORMAL HIGH (ref 0.44–1.00)
GFR, Estimated: 49 mL/min — ABNORMAL LOW (ref >60)
Glucose, Bld: 125 mg/dL — ABNORMAL HIGH (ref 70–99)
Potassium: 3.2 mmol/L — ABNORMAL LOW (ref 3.5–5.1)
Sodium: 140 mmol/L (ref 135–145)
Total Bilirubin: 1.4 mg/dL — ABNORMAL HIGH (ref <1.2)
Total Protein: 7.2 g/dL (ref 6.5–8.1)

## 2023-02-21 LAB — CBC WITH DIFFERENTIAL/PLATELET
Abs Immature Granulocytes: 0.04 10*3/uL (ref 0.00–0.07)
Basophils Absolute: 0.1 10*3/uL (ref 0.0–0.1)
Basophils Relative: 1 %
Eosinophils Absolute: 0.4 10*3/uL (ref 0.0–0.5)
Eosinophils Relative: 5 %
HCT: 39.1 % (ref 36.0–46.0)
Hemoglobin: 13.1 g/dL (ref 12.0–15.0)
Immature Granulocytes: 1 %
Lymphocytes Relative: 21 %
Lymphs Abs: 1.6 10*3/uL (ref 0.7–4.0)
MCH: 32.3 pg (ref 26.0–34.0)
MCHC: 33.5 g/dL (ref 30.0–36.0)
MCV: 96.3 fL (ref 80.0–100.0)
Monocytes Absolute: 0.5 10*3/uL (ref 0.1–1.0)
Monocytes Relative: 7 %
Neutro Abs: 5 10*3/uL (ref 1.7–7.7)
Neutrophils Relative %: 65 %
Platelets: 218 10*3/uL (ref 150–400)
RBC: 4.06 MIL/uL (ref 3.87–5.11)
RDW: 13.6 % (ref 11.5–15.5)
WBC: 7.6 10*3/uL (ref 4.0–10.5)
nRBC: 0 % (ref 0.0–0.2)

## 2023-02-21 LAB — I-STAT CG4 LACTIC ACID, ED: Lactic Acid, Venous: 1.4 mmol/L (ref 0.5–1.9)

## 2023-02-21 MED ORDER — POTASSIUM CHLORIDE CRYS ER 20 MEQ PO TBCR
40.0000 meq | EXTENDED_RELEASE_TABLET | Freq: Once | ORAL | Status: AC
  Administered 2023-02-22: 40 meq via ORAL
  Filled 2023-02-21: qty 2

## 2023-02-21 NOTE — Telephone Encounter (Signed)
Message left for [name] to call us back.

## 2023-02-21 NOTE — ED Provider Notes (Signed)
  Physical Exam  BP (!) 175/86   Pulse 66   Temp 98.1 F (36.7 C) (Oral)   Resp 18   Wt 80.3 kg   SpO2 100%   BMI 27.73 kg/m   Physical Exam  Procedures  Procedures  ED Course / MDM   Clinical Course as of 02/22/23 0051  Fri Feb 21, 2023  2100 Assumed primary care of the patient.  82 year old female with history of dementia who presented to the emergency department after a fall.  To me she is alert and oriented to self and place.  Tells me that 2 days ago she was trying get out of a chair and fell.  Did not hit her head.  Has been having knee pain since then.  Says that her home health aide told her to go into the emergency department today so she came in.  There are also some reports that her son is concerned about a urinary tract infection but unable to reach him for additional information after calling the 3 numbers on file we have for him. [RP]  2348 Son [name] called back. [place] Spaniel they were concerned about a UTI because her urine didn't smell normally. Lives at home with his mother and he says that she is now too weak to go home. She typically goes to a senior center during the day or has a CNA that helps. Now the CNA is concerned that she may not be able to take care of her because she is getting too weak. Would like for her to be evaluated for SNF/Rehab placement. [RP]  Sat Feb 22, 2023  0049 UA returned with rare bacteria, 11-20 WBC, and small LE. Will go ahead and treat for UTI with duricef. Urine culture sent. Pt does not appear to be altered at this time and is at her mental baseline. Will place her in [facility] boarder status for PT/OT eval since it sounds like she may need placement.  [RP]    Clinical Course User Index [RP] Rondel Baton, MD   Medical Decision Making Amount and/or Complexity of Data Reviewed Labs: ordered. Radiology: ordered.  Risk Prescription drug management.      Rondel Baton, MD 02/22/23 [phone]

## 2023-02-21 NOTE — ED Triage Notes (Signed)
Pt arrived from home via [facility] s/p fall yesterday. Confused at baseline. Pt c/o bilateral posterior knee and upper leg pain. Caregiver concerned about foul smelling urine and recent behavioral changes.  Ems vs B/P 174/74 P 66 O2 sat 99 Cbg 131

## 2023-02-21 NOTE — ED Provider Notes (Signed)
[place] [facility] DE[facility] AT [facility] Provider Note   CSN: [id number] Arrival date & time: 02/21/23  2000     History  Chief Complaint  Patient presents with   Yvonne Bailey    TIMECA VALIDO is a 82 y.o. female with past medical history significant for hypertension, A-fib, CKD, peripheral vascular disease, heart failure, dementia, metabolic encephalopathy who presents with concern for fall yesterday with confusion at baseline.  Patient endorsing some knee pain bilaterally, her caregivers raised concern for foul-smelling urine and recent worsening of dementia.  No one accompanied patient, and no contact information available to confirm concerns, attempts made to reach out to both the patient's emergency contacts but no response from either.   Fall       Home Medications Prior to Admission medications   Medication Sig Start Date End Date Taking? Authorizing Provider  aspirin EC 81 MG tablet Take 81 mg by mouth daily. Swallow whole.    [provider]  atorvastatin (LIPITOR) 40 MG tablet TAKE 2 TABLETS(80 MG) BY MOUTH DAILY 02/11/23   Burns, Bobette Mo, MD  dorzolamide-timolol (COSOPT) 2-0.5 % ophthalmic solution Place 1 drop into both eyes in the morning, at noon, and at bedtime.    [provider]  ELIQUIS 5 MG TABS tablet TAKE 1 TABLET(5 MG) BY MOUTH TWICE DAILY 02/18/23   Burns, Bobette Mo, MD  furosemide (LASIX) 20 MG tablet Take 1 tablet (20 mg total) by mouth daily. Patient taking differently: Take 20 mg by mouth daily as needed for edema. 11/28/22   Glade Lloyd, MD  levothyroxine (SYNTHROID) 100 MCG tablet Take 100 mcg by mouth daily before breakfast.    [provider]  losartan (COZAAR) 25 MG tablet Take 1 tablet (25 mg total) by mouth daily. 01/31/23   Pincus Sanes, MD  metoprolol tartrate (LOPRESSOR) 25 MG tablet Take 0.5 tablets (12.5 mg total) by mouth 2 (two) times daily. 01/31/23   Pincus Sanes, MD  MYRBETRIQ 25 MG TB24  tablet TAKE 1 TABLET(25 MG) BY MOUTH DAILY 04/30/22   Burns, Bobette Mo, MD  Netarsudil Dimesylate (RHOPRESSA) 0.02 % SOLN Place 1 drop into both eyes every evening.    [provider]  nitroGLYCERIN (NITROSTAT) 0.4 MG SL tablet Place 1 tablet (0.4 mg total) under the tongue every 5 (five) minutes as needed for chest pain. 09/08/15   Little Ishikawa, NP  polyethylene glycol (MIRALAX) 17 g packet Take 17 g by mouth daily as needed for mild constipation. 11/27/22   Glade Lloyd, MD  senna-docusate (SENOKOT-S) 8.6-50 MG tablet Take 2 tablets by mouth 2 (two) times daily. 11/27/22   Glade Lloyd, MD  sertraline (ZOLOFT) 100 MG tablet Take 1 tablet (100 mg total) by mouth daily. 01/31/23   Pincus Sanes, MD      Allergies    Definity [perflutren lipid microsphere] and Pletal [cilostazol]    Review of Systems   Review of Systems  Unable to perform ROS: Dementia    Physical Exam Updated Vital Signs BP (!) 151/73   Pulse 62   Temp 98.1 F (36.7 C) (Oral)   Resp 17   SpO2 97%  Physical Exam Vitals and nursing note reviewed.  Constitutional:      General: She is not in acute distress.    Appearance: Normal appearance.  HENT:     Head: Normocephalic and atraumatic.  Eyes:     General:        Right eye: No discharge.  Left eye: No discharge.  Cardiovascular:     Rate and Rhythm: Normal rate and regular rhythm.     Heart sounds: No murmur heard.    No friction rub. No gallop.  Pulmonary:     Effort: Pulmonary effort is normal.     Breath sounds: Normal breath sounds.  Abdominal:     General: Bowel sounds are normal.     Palpations: Abdomen is soft.  Musculoskeletal:     Comments: Patient endorses some tenderness with palpation of knees/upper tibia, no step-off, deformity noted, no significant bruising noted, normal range of motion of bilateral knees, hips  Skin:    General: Skin is warm and dry.     Capillary Refill: Capillary refill takes less than 2 seconds.   Neurological:     Mental Status: She is alert. Mental status is at baseline.     Comments: Moves all 4 limbs spontaneously, CN II through XII grossly intact, can ambulate without difficulty, intact sensation throughout.   Psychiatric:        Mood and Affect: Mood normal.        Behavior: Behavior normal.     ED Results / Procedures / Treatments   Labs (all labs ordered are listed, but only abnormal results are displayed) Labs Reviewed  CBC WITH DIFFERENTIAL/PLATELET  COMPREHENSIVE METABOLIC PANEL  URINALYSIS, W/ REFLEX TO CULTURE (INFECTION SUSPECTED)  I-STAT CG4 LACTIC ACID, ED    EKG None  Radiology No results found.  Procedures Procedures    Medications Ordered in ED Medications - No data to display  ED Course/ Medical Decision Making/ A&P                                 Medical Decision Making Amount and/or Complexity of Data Reviewed Labs: ordered. Radiology: ordered.   This patient is a 82 y.o. female  who presents to the ED for concern of fall, altered mental status.   Differential diagnoses prior to evaluation: The emergent differential diagnosis includes, but is not limited to,  epidural hematoma, subdural hematoma, skull fracture, subarachnoid hemorrhage, unstable cervical spine fracture, concussion vs other MSK injury, CVA, seizure, hypotension, sepsis, hypoglycemia, hypoxic encephalopathy, metabolic encephalopathy, polypharmacy, substance abuse, developing dementia or alzheimers, meningitis, encephalitis, hypertensive emergency, other systemic infection, acute alcohol intoxication, acute alcohol or other drug withdrawal or psychiatric manifestation vs other . This is not an exhaustive differential.   Past Medical History / Co-morbidities / Social History: hypertension, A-fib, CKD, peripheral vascular disease, heart failure, dementia, metabolic encephalopathy  Additional history: Chart reviewed. Pertinent results include: reviewed labwork, imaging from  recent previous ED visits  Physical Exam: Physical exam performed. The pertinent findings include: Patient endorses some tenderness with palpation of knees/upper tibia, no step-off, deformity noted, no significant bruising noted, normal range of motion of bilateral knees, hips   Lab Tests/Imaging studies: I personally interpreted labs/imaging and the pertinent results include: CBC unremarkable, lactic acid normal 1.4, UA, CMP pending.  9:16 PM Care of [name] transferred to Dr. Eloise Harman at the end of my shift as the patient will require reassessment once labs/imaging have resulted. Patient presentation, ED course, and plan of care discussed with review of all pertinent labs and imaging. Please see his/her note for further details regarding further ED course and disposition. Plan at time of handoff is pending labwork, imaging, okay to discharge back to home +- UTI treatment. This may be altered or completely  changed at the discretion of the oncoming team pending results of further workup.  Final Clinical Impression(s) / ED Diagnoses Final diagnoses:  None    Rx / DC Orders ED Discharge Orders     None         [place] 02/21/23 2116    Rondel Baton, MD 02/22/23 1336

## 2023-02-21 NOTE — Telephone Encounter (Signed)
What I did he want to add this medication?

## 2023-02-21 NOTE — ED Notes (Addendum)
Cath urine  performed  sent to lab performed by myself and makala nt

## 2023-02-22 ENCOUNTER — Encounter (HOSPITAL_COMMUNITY): Payer: Self-pay

## 2023-02-22 LAB — URINALYSIS, W/ REFLEX TO CULTURE (INFECTION SUSPECTED)
Bilirubin Urine: NEGATIVE
Glucose, UA: NEGATIVE mg/dL
Ketones, ur: NEGATIVE mg/dL
Nitrite: NEGATIVE
Protein, ur: NEGATIVE mg/dL
RBC / HPF: >50 RBC/hpf (ref 0–5)
Specific Gravity, Urine: 1.017 (ref 1.005–1.030)
pH: 5 (ref 5.0–8.0)

## 2023-02-22 MED ORDER — LOSARTAN POTASSIUM 50 MG PO TABS
25.0000 mg | ORAL_TABLET | Freq: Every day | ORAL | Status: DC
  Administered 2023-02-22 – 2023-02-26 (×5): 25 mg via ORAL
  Filled 2023-02-22 (×5): qty 1

## 2023-02-22 MED ORDER — CEFDINIR 300 MG PO CAPS: 300.0000 mg | ORAL_CAPSULE | Freq: Two times a day (BID) | ORAL | Status: DC

## 2023-02-22 MED ORDER — MIRABEGRON ER 25 MG PO TB24
25.0000 mg | ORAL_TABLET | Freq: Every day | ORAL | Status: DC
  Administered 2023-02-22 – 2023-02-25 (×3): 25 mg via ORAL
  Filled 2023-02-22 (×6): qty 1

## 2023-02-22 MED ORDER — APIXABAN 5 MG PO TABS
5.0000 mg | ORAL_TABLET | Freq: Two times a day (BID) | ORAL | Status: DC
  Administered 2023-02-22 – 2023-02-26 (×9): 5 mg via ORAL
  Filled 2023-02-22 (×9): qty 1

## 2023-02-22 MED ORDER — LEVOTHYROXINE SODIUM 100 MCG PO TABS
100.0000 ug | ORAL_TABLET | Freq: Every day | ORAL | Status: DC
Start: 2023-02-23 — End: 2023-02-26
  Administered 2023-02-23 – 2023-02-26 (×4): 100 ug via ORAL
  Filled 2023-02-22 (×4): qty 1

## 2023-02-22 MED ORDER — LEVOTHYROXINE SODIUM 100 MCG PO TABS: 100.0000 ug | ORAL_TABLET | Freq: Every day | ORAL | Status: DC

## 2023-02-22 MED ORDER — METOPROLOL TARTRATE 25 MG PO TABS
12.5000 mg | ORAL_TABLET | Freq: Two times a day (BID) | ORAL | Status: DC
  Administered 2023-02-22 – 2023-02-26 (×9): 12.5 mg via ORAL
  Filled 2023-02-22 (×9): qty 1

## 2023-02-22 MED ORDER — DORZOLAMIDE HCL-TIMOLOL MAL 2-0.5 % OP SOLN
1.0000 [drp] | Freq: Three times a day (TID) | OPHTHALMIC | Status: DC
  Administered 2023-02-22 – 2023-02-25 (×9): 1 [drp] via OPHTHALMIC
  Filled 2023-02-22 (×3): qty 10

## 2023-02-22 MED ORDER — ATORVASTATIN CALCIUM 80 MG PO TABS
80.0000 mg | ORAL_TABLET | Freq: Every day | ORAL | Status: DC
  Administered 2023-02-22 – 2023-02-26 (×5): 80 mg via ORAL
  Filled 2023-02-22: qty 1
  Filled 2023-02-22: qty 2
  Filled 2023-02-22 (×3): qty 1

## 2023-02-22 MED ORDER — SENNOSIDES-DOCUSATE SODIUM 8.6-50 MG PO TABS
2.0000 | ORAL_TABLET | Freq: Two times a day (BID) | ORAL | Status: DC
  Administered 2023-02-22 – 2023-02-26 (×9): 2 via ORAL
  Filled 2023-02-22 (×9): qty 2

## 2023-02-22 MED ORDER — NETARSUDIL DIMESYLATE 0.02 % OP SOLN: 1.0000 [drp] | Freq: Every evening | OPHTHALMIC | Status: DC

## 2023-02-22 MED ORDER — FUROSEMIDE 20 MG PO TABS: 20.0000 mg | ORAL_TABLET | Freq: Every day | ORAL | Status: DC | PRN

## 2023-02-22 MED ORDER — CEFADROXIL 500 MG PO CAPS
500.0000 mg | ORAL_CAPSULE | Freq: Two times a day (BID) | ORAL | Status: DC
  Administered 2023-02-22 – 2023-02-26 (×8): 500 mg via ORAL
  Filled 2023-02-22 (×12): qty 1

## 2023-02-22 MED ORDER — SERTRALINE HCL 100 MG PO TABS
100.0000 mg | ORAL_TABLET | Freq: Every day | ORAL | Status: DC
  Administered 2023-02-22 – 2023-02-26 (×5): 100 mg via ORAL
  Filled 2023-02-22 (×5): qty 1

## 2023-02-22 MED ORDER — ASPIRIN 81 MG PO TBEC
81.0000 mg | DELAYED_RELEASE_TABLET | Freq: Every day | ORAL | Status: DC
  Administered 2023-02-22 – 2023-02-26 (×5): 81 mg via ORAL
  Filled 2023-02-22 (×5): qty 1

## 2023-02-22 NOTE — [place] FL2 (Signed)
[place] MEDICAID FL2 LEVEL OF CARE FORM     IDENTIFICATION  Patient Name: Yvonne Bailey Birthdate: 05/24/1940 Sex: female Admission Date (Current Location): 02/21/2023  [facility] and [place] Number:  Producer, television/film/video and Address:  [facility], [address] [place], [place] [address]      Provider Number: [id number]  Attending Physician Name and Address:  System, Provider Not In  Relative Name and Phone Number:       Current Level of Care: Hospital Recommended Level of Care: Skilled Nursing Facility Prior Approval Number:    Date Approved/Denied:   PASRR Number: [id number]  Discharge Plan: SNF    Current Diagnoses: Patient Active Problem List   Diagnosis Date Noted   Physical deconditioning 01/31/2023   Poor balance 01/31/2023   Community acquired pneumonia 11/24/2022   Recurrent episodes of unresponsiveness 07/02/2022   TIA (transient ischemic attack) 06/29/2022   Moderate dementia (HCC) 04/30/2022   Fall 03/17/2022   Hypomagnesemia 11/19/2021   Hypokalemia 11/17/2021   Acute metabolic encephalopathy 09/03/2021   Rash 08/29/2020   Anxiety and depression 03/28/2020   Overactive bladder 03/28/2020   Lack of motivation 12/21/2019   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   Aphasia as late effect of cerebrovascular accident (CVA)    Acute ischemic cerebrovascular accident (CVA) involving left middle cerebral artery [facility])    Coronary artery disease involving native coronary artery without angina pectoris    Stage 3 chronic kidney disease (HCC)    Prediabetes 07/28/2016   DOE (dyspnea on exertion) 07/09/2016   Gait instability 07/09/2016   Cardiomyopathy, ischemic    Chronic systolic heart failure (HCC) 08/21/2015   Bilateral leg edema 07/19/2015   Atrial fibrillation (HCC) 07/19/2015   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   Small vessel disease, cerebrovascular 12/17/2012   CAROTID BRUIT, RIGHT 08/10/2008   Peripheral  vascular disease (HCC) 06/04/2007   Diverticulosis of large intestine 06/04/2007   Hypothyroidism 02/24/2007   Dyslipidemia 02/24/2007   Essential hypertension 02/24/2007   Acute thromboembolism of deep veins of lower extremity (HCC) 01/21/2007    Orientation RESPIRATION BLADDER Height & Weight     Self  Normal Continent Weight: 177 lb 0.5 oz (80.3 kg) Height:     BEHAVIORAL SYMPTOMS/MOOD NEUROLOGICAL BOWEL NUTRITION STATUS      Continent Diet (Heart healthy)  AMBULATORY STATUS COMMUNICATION OF NEEDS Skin   Extensive Assist Verbally Normal                       Personal Care Assistance Level of Assistance  Bathing, Feeding, Dressing Bathing Assistance: Maximum assistance Feeding assistance: Limited assistance Dressing Assistance: Maximum assistance     Functional Limitations Info  Hearing, Sight, Speech Sight Info: Adequate Hearing Info: Adequate Speech Info: Adequate    SPECIAL CARE FACTORS FREQUENCY  PT (By licensed PT), OT (By licensed OT)     PT Frequency: 5x weekly OT Frequency: 5x weekly            Contractures Contractures Info: Not present    Additional Factors Info  Code Status, Allergies Code Status Info: Full Code Allergies Info: Definity (perflutren Lipid Microsphere) , Pletal (cilostazol)           Current Medications (02/22/2023):  This is the current hospital active medication list Current Facility-Administered Medications  Medication Dose Route Frequency Provider Last Rate Last Admin   cefadroxil (DURICEF) capsule 500 mg  500 mg Oral BID Rondel Baton, MD   500  mg at 02/22/23 1104   Current Outpatient Medications  Medication Sig Dispense Refill   aspirin EC 81 MG tablet Take 81 mg by mouth daily. Swallow whole.     atorvastatin (LIPITOR) 40 MG tablet TAKE 2 TABLETS(80 MG) BY MOUTH DAILY (Patient taking differently: Take 80 mg by mouth daily.) 180 tablet 1   dorzolamide-timolol (COSOPT) 2-0.5 % ophthalmic solution Place 1 drop  into both eyes in the morning, at noon, and at bedtime.     ELIQUIS 5 MG TABS tablet TAKE 1 TABLET(5 MG) BY MOUTH TWICE DAILY (Patient taking differently: Take 5 mg by mouth 2 (two) times daily.) 180 tablet 0   levothyroxine (SYNTHROID) 100 MCG tablet Take 100 mcg by mouth daily before breakfast.     losartan (COZAAR) 25 MG tablet Take 1 tablet (25 mg total) by mouth daily. 90 tablet 1   metoprolol tartrate (LOPRESSOR) 25 MG tablet Take 0.5 tablets (12.5 mg total) by mouth 2 (two) times daily. 90 tablet 1   MYRBETRIQ 25 MG TB24 tablet TAKE 1 TABLET(25 MG) BY MOUTH DAILY (Patient taking differently: Take 25 mg by mouth daily.) 30 tablet 5   Netarsudil Dimesylate (RHOPRESSA) 0.02 % SOLN Place 1 drop into both eyes every evening.     polyethylene glycol (MIRALAX) 17 g packet Take 17 g by mouth daily as needed for mild constipation.     senna-docusate (SENOKOT-S) 8.6-50 MG tablet Take 2 tablets by mouth 2 (two) times daily. 30 tablet 0   sertraline (ZOLOFT) 100 MG tablet Take 1 tablet (100 mg total) by mouth daily. 90 tablet 1   furosemide (LASIX) 20 MG tablet Take 1 tablet (20 mg total) by mouth daily. (Patient taking differently: Take 20 mg by mouth daily as needed for edema.) 30 tablet 0   nitroGLYCERIN (NITROSTAT) 0.4 MG SL tablet Place 1 tablet (0.4 mg total) under the tongue every 5 (five) minutes as needed for chest pain. (Patient not taking: Reported on 02/22/2023) 25 tablet 1     Discharge Medications: Please see discharge summary for a list of discharge medications.  Relevant Imaging Results:  Relevant Lab Results:   Additional Information SSN: [id number]  Inis Sizer, LCSW

## 2023-02-22 NOTE — Progress Notes (Signed)
CSW spoke with patient's son [name] to discuss PT recommendations for SNF. [name] states agreement for CSW to initiate SNF search. [name] reports that patient lives with him and has caregivers through [facility] that come daily. [name] states no preference for facility other than [facility].  CSW will complete FL2 and fax patient's clinical information out for review to obtain bed offers.  Edwin Dada, MSW, LCSW Transitions of Care  Clinical Social Worker II [phone]

## 2023-02-22 NOTE — ED Notes (Signed)
Patient has been cleaned, dried, and repositioned, breakfast and fluids provided.

## 2023-02-22 NOTE — Evaluation (Signed)
Physical Therapy Evaluation Patient Details Name: Yvonne Bailey MRN: [id number] DOB: 1940/04/13 Today's Date: 02/22/2023  History of Present Illness  Patient is 82 y.o. female presenting emergency department with chief complaint of fatigue.  Patient holding her eyes closed, refusing to talk.  Will withdrawal all extremities to pain.  History of dementia. Workup in ED course largely unremarkable. PMH significant for dementia, UTIs, CAD, s/p PCI, DVT, HTN, icm, lacunar infarct, MI, PAD, CKD3, stroke.  Clinical Impression  Pt is presenting below baseline level of functioning. Per RN pt states son reports pt was able to walk about 1 week ago. Currently pt is Mod to Max A for bed mobility and total assist for sit to stand. Unable to attempt transfer due to weakness and difficulty with motor planning for safe movement. Due to pt current functional status, home set up and available assistance at home recommending skilled physical therapy services < 3 hours/day in order to address strength, balance and functional mobility to decrease risk for falls, injury, immobility, skin break down and re-hospitalization.          If plan is discharge home, recommend the following: Two people to help with walking and/or transfers;Assist for transportation;Help with stairs or ramp for entrance;Supervision due to cognitive status;Assistance with cooking/housework   Can travel by private vehicle   No    Equipment Recommendations Other (comment) (defer to post acute)     Functional Status Assessment Patient has had a recent decline in their functional status and/or demonstrates limited ability to make significant improvements in function in a reasonable and predictable amount of time     Precautions / Restrictions Precautions Precautions: Fall Restrictions Weight Bearing Restrictions Per Provider Order: No      Mobility  Bed Mobility Overal bed mobility: Needs Assistance Bed Mobility: Supine to Sit,  Sit to Supine     Supine to sit: Mod assist Sit to supine: Max assist   General bed mobility comments: Pt requires verbal cues for sequencint and Mod A for trunk to mid line for supine to sitting and Max A at trunk and bil LE for sitting to supine.    Transfers Overall transfer level: Needs assistance Equipment used: 1 person hand held assist Transfers: Sit to/from Stand Sit to Stand: From elevated surface, Total assist           General transfer comment: Attempted trying to stand edge of stretcher. Pt was unable; Pt seems to have difficulty wiht motor planning of the activity rather than lacking the strength. Pt was leaning back heavily and had difficulty following directions for foot placement and trunk positioning in order to safely perform activity. Pt was able to partially raise hips from edge of stretcher but unable to clear hips.       Balance Overall balance assessment: Needs assistance Sitting-balance support: Feet unsupported, Bilateral upper extremity supported Sitting balance-Leahy Scale: Poor Sitting balance - Comments: intermittent Mod A; possibly due to stretching Postural control: Posterior lean Standing balance support: Bilateral upper extremity supported Standing balance-Leahy Scale: Zero Standing balance comment: unable to get fully to standing           Pertinent Vitals/Pain Pain Assessment Pain Assessment: No/denies pain Breathing: normal Negative Vocalization: none Facial Expression: smiling or inexpressive Body Language: relaxed Consolability: no need to console PAINAD Score: 0    Home Living Family/patient expects to be discharged to:: Private residence Living Arrangements: Children (lives with son) Available Help at Discharge: Family;Available PRN/intermittently;Personal care attendant Type of Home: House  Home Access: Stairs to enter Entrance Stairs-Rails: Right Entrance Stairs-Number of Steps: 6   Home Layout: One level Home  Equipment: Agricultural consultant (2 wheels);BSC/3in1;Shower seat;Cane - single point Additional Comments: Information from prior admission in chart; pt unable to provide home set up or PLOF. pt has a home aid but not 24/7 per ER physician note and CNA and son are concerned she is too weak to continue at home at this time.    Prior Function Prior Level of Function : Needs assist  Cognitive Assist : Mobility (cognitive)           Mobility Comments: ambulates with RW ADLs Comments: has assist with ADLs/IADLs     Extremity/Trunk Assessment   Upper Extremity Assessment Upper Extremity Assessment: Generalized weakness    Lower Extremity Assessment Lower Extremity Assessment: Generalized weakness    Cervical / Trunk Assessment Cervical / Trunk Assessment: Kyphotic  Communication   Communication Communication: Difficulty communicating thoughts/reduced clarity of speech Cueing Techniques: Verbal cues;Tactile cues  Cognition Arousal: Alert Behavior During Therapy: WFL for tasks assessed/performed, Anxious Overall Cognitive Status: History of cognitive impairments - at baseline     Following Commands: Follows one step commands inconsistently Safety/Judgement: Decreased awareness of safety, Decreased awareness of deficits     General Comments: Pt was very concerned about creatures in her room and had difficulty being re-directed.        General Comments General comments (skin integrity, edema, etc.): No noted skin integrity issues outside long sleeved sweater and brief.        Assessment/Plan    PT Assessment Patient needs continued PT services  PT Problem List Decreased strength;Decreased range of motion;Decreased activity tolerance;Decreased balance;Decreased mobility;Decreased cognition;Decreased knowledge of use of DME;Decreased safety awareness;Decreased knowledge of precautions;Pain       PT Treatment Interventions DME instruction;Cognitive remediation;Patient/family  education;Neuromuscular re-education;Balance training;Therapeutic exercise;Therapeutic activities;Functional mobility training;Stair training;Gait training    PT Goals (Current goals can be found in the Care Plan section)  Acute Rehab PT Goals Patient Stated Goal: none stated PT Goal Formulation: Patient unable to participate in goal setting Time For Goal Achievement: 03/08/23 Potential to Achieve Goals: Fair    Frequency Min 1X/week        AM-PAC PT "6 Clicks" Mobility  Outcome Measure Help needed turning from your back to your side while in a flat bed without using bedrails?: A Lot Help needed moving from lying on your back to sitting on the side of a flat bed without using bedrails?: A Lot Help needed moving to and from a bed to a chair (including a wheelchair)?: Total Help needed standing up from a chair using your arms (e.g., wheelchair or bedside chair)?: Total Help needed to walk in hospital room?: Total Help needed climbing 3-5 steps with a railing? : Total 6 Click Score: 8    End of Session Equipment Utilized During Treatment: Gait belt Activity Tolerance: Patient tolerated treatment well Patient left: in bed;with call bell/phone within reach Nurse Communication: Mobility status;Other (comment) (Pt most likely needs to be on feeding schedule due to food was all over room with none eaten on arrival.) PT Visit Diagnosis: Other abnormalities of gait and mobility (R26.89);Muscle weakness (generalized) (M62.81);Difficulty in walking, not elsewhere classified (R26.2)    Time: 6578-4696 PT Time Calculation (min) (ACUTE ONLY): 23 min   Charges:   PT Evaluation $PT Eval Low Complexity: 1 Low PT Treatments $Therapeutic Activity: 8-22 mins PT General Charges $$ ACUTE PT VISIT: 1 Visit  Harrel Carina, DPT, CLT  Acute Rehabilitation Services Office: [phone] (Secure chat preferred)   Claudia Desanctis 02/22/2023, 11:06 AM

## 2023-02-22 NOTE — ED Provider Notes (Signed)
Emergency Medicine Observation Re-evaluation Note  Yvonne Bailey is a 82 y.o. female, seen on rounds today.  Pt initially presented to the ED for complaints of Fall Currently, the patient is resting.  Physical Exam  BP (!) 157/96   Pulse 63   Temp 98.1 F (36.7 C) (Oral)   Resp 18   Wt 80.3 kg   SpO2 97%   BMI 27.73 kg/m  Physical Exam General: NAD   ED Course / MDM  EKG:   I have reviewed the labs performed to date as well as medications administered while in observation.  Recent changes in the last 24 hours include pt pending PT recommendations for SNF placement. Home meds ordered.  Plan  Current plan is for SW to coordinate placement.    Ernie Avena, MD 02/22/23 [phone]

## 2023-02-22 NOTE — ED Notes (Signed)
Son updated as to patient's status. ?

## 2023-02-23 LAB — URINE CULTURE: Culture: NO GROWTH

## 2023-02-23 NOTE — ED Notes (Signed)
Unable  to find her eye drops  ed pharmacist will sent a new  bottle

## 2023-02-23 NOTE — ED Provider Notes (Signed)
Emergency Medicine Observation Re-evaluation Note  Yvonne Bailey is a 82 y.o. female, seen on rounds today.  Pt initially presented to the ED for complaints of Fall Currently, the patient is resting.  Physical Exam  BP (!) 182/98   Pulse 79   Temp 98 F (36.7 C) (Oral)   Resp 18   Wt 80.3 kg   SpO2 100%   BMI 27.73 kg/m  Physical Exam General: NAD   ED Course / MDM  EKG:   I have reviewed the labs performed to date as well as medications administered while in observation.  Recent changes in the last 24 hours include none.  Plan  Current plan is for SW to coordinate placement.        Ernie Avena, MD 02/23/23 1153

## 2023-02-23 NOTE — Progress Notes (Addendum)
1pm: CSW spoke with [name] who states agreement to accepting bed offer from [facility].  CSW initiated insurance authorization.  CSW notified Lawerance Cruel at [facility] of acceptance.  12:30pm: CSW received bed offer from [facility].  CSW attempted to reach patient's son [name] without success - no voicemail option, text message was sent requesting a return call.  11am; CSW spoke with [facility] in admissions at [facility] who states referrals are not being reviewed today.  9am: CSW spoke with patient's son [name] to present him with bed offer from [facility], [name] states he would prefer to wait until additional bed offers are in prior to making a decision. CSW agreed to reach out to facilities to request patient be reviewed for possible admission.  CSW reached out to admissions at [facility] and [facility].  Edwin Dada, MSW, LCSW Transitions of Care  Clinical Social Worker II [phone]

## 2023-02-23 NOTE — ED Notes (Signed)
The pt is sleeping she has been here for 2 nights already and she has not slept until today when is saw her

## 2023-02-23 NOTE — ED Notes (Signed)
The pt had a enormous med brown stoole soft that went literally all over the brief and the bed   apparently this is the 2nd type of stool   the other was  on day shift

## 2023-02-23 NOTE — ED Notes (Signed)
Pericare performed, pt mod BM

## 2023-02-24 NOTE — ED Notes (Signed)
Peri-care performed

## 2023-02-24 NOTE — ED Notes (Signed)
Son Yvonne Bailey [phone] would like an update asap and why she hasn't been transported to [facility] yet

## 2023-02-24 NOTE — ED Provider Notes (Signed)
Emergency Medicine Observation Re-evaluation Note  Yvonne Bailey is a 82 y.o. female, seen on rounds today.  Pt initially presented to the ED for complaints of Fall Currently, the patient is resting.  Physical Exam  BP (!) 156/70   Pulse 66   Temp (!) 97.5 F (36.4 C)   Resp 18   Wt 80.3 kg   SpO2 100%   BMI 27.73 kg/m  Physical Exam General: nad Lungs: no distress Psych: calm  ED Course / MDM  EKG:   I have reviewed the labs performed to date as well as medications administered while in observation.  Recent changes in the last 24 hours include TOC/social work diligently working on placement.  Patient has bed offers available at [facility] and [facility], son is considering options, also reaching out to [facility]  Current plan is for placement.    Sloan Leiter, DO 02/24/23 [phone]

## 2023-02-24 NOTE — Progress Notes (Signed)
Patient's insurance authorization is currently still pending. CSW will continue to check [employer] portal for updates.  Edwin Dada, MSW, LCSW Transitions of Care  Clinical Social Worker II [phone]

## 2023-02-24 NOTE — ED Notes (Signed)
Pt BP elevated, Dr Bebe Shaggy notified, no new orders given. Will continue to monitor

## 2023-02-24 NOTE — ED Notes (Signed)
[name] (son)updated as to patient's status.

## 2023-02-25 NOTE — ED Notes (Signed)
Hospital bed ordered for patient at this time

## 2023-02-25 NOTE — Progress Notes (Addendum)
1:40pm: Patient's insurance authorization is still pending at this time.  11am: Patient's insurance authorization is still pending at this time.  8:30am: Patient's insurance authorization is still pending at this time.  CSW spoke with patient's son [name] to inform him of information.  Edwin Dada, MSW, LCSW Transitions of Care  Clinical Social Worker II [phone]

## 2023-02-25 NOTE — ED Notes (Signed)
Pt brief changed and pt bed placed back in low position with door open.

## 2023-02-25 NOTE — Progress Notes (Signed)
Physical Therapy Treatment Patient Details Name: Yvonne Bailey MRN: [id number] DOB: 11/15/1940 Today's Date: 02/25/2023   History of Present Illness Patient is 82 y.o. female presenting emergency department with chief complaint of fatigue.  Patient holding her eyes closed, refusing to talk.  Will withdrawal all extremities to pain.  History of dementia. Workup in ED course largely unremarkable. PMH significant for dementia, UTIs, CAD, s/p PCI, DVT, HTN, icm, lacunar infarct, MI, PAD, CKD3, stroke.    PT Comments  Pt demonstrates improved participation this session, following commands more consisently. Pt remains very confused, only oriented to person. Session is limited as pt is incontinent of bowels prior to and during session. Pt requires significant assistance to roll and sit at edge of bed, transfer attempts are deferred due to persistent posterior lean in sitting and high risk for falls. Patient will benefit from continued inpatient follow up therapy, <3 hours/day.   If plan is discharge home, recommend the following: Two people to help with walking and/or transfers;Assist for transportation;Help with stairs or ramp for entrance;Supervision due to cognitive status;Assistance with cooking/housework   Can travel by private vehicle     No  Equipment Recommendations  Hospital bed;Hoyer lift;Wheelchair (measurements PT);Wheelchair cushion (measurements PT)    Recommendations for Other Services       Precautions / Restrictions Precautions Precautions: Fall Restrictions Weight Bearing Restrictions Per Provider Order: No     Mobility  Bed Mobility Overal bed mobility: Needs Assistance Bed Mobility: Rolling, Supine to Sit, Sit to Supine Rolling: Max assist   Supine to sit: Max assist Sit to supine: Max assist        Transfers Overall transfer level:  (deferred 2/2 strong posterior lean in sitting)                      Ambulation/Gait                    Stairs             Wheelchair Mobility     Tilt Bed    Modified Rankin (Stroke Patients Only)       Balance Overall balance assessment: Needs assistance Sitting-balance support: No upper extremity supported, Feet unsupported Sitting balance-Leahy Scale: Poor Sitting balance - Comments: maxA, posterior lean Postural control: Posterior lean                                  Cognition Arousal: Alert Behavior During Therapy: WFL for tasks assessed/performed Overall Cognitive Status: History of cognitive impairments - at baseline Area of Impairment: Orientation, Attention, Memory, Following commands, Safety/judgement, Problem solving, Awareness                 Orientation Level: Disoriented to, Place, Time, Situation Current Attention Level: Focused Memory: Decreased recall of precautions, Decreased short-term memory Following Commands: Follows one step commands consistently, Follows one step commands with increased time Safety/Judgement: Decreased awareness of safety, Decreased awareness of deficits Awareness: Intellectual Problem Solving: Slow processing, Decreased initiation, Difficulty sequencing, Requires verbal cues          Exercises      General Comments General comments (skin integrity, edema, etc.): VSS on RA, pt incontinent of stool prior to and during PT session. PT and nurse tech assist in hygiene tasks      Pertinent Vitals/Pain Pain Assessment Pain Assessment: PAINAD Breathing: normal Negative Vocalization: none Facial Expression: smiling or  inexpressive Body Language: relaxed Consolability: no need to console PAINAD Score: 0    Home Living                          Prior Function            PT Goals (current goals can now be found in the care plan section) Acute Rehab PT Goals Patient Stated Goal: to go home Progress towards PT goals: Progressing toward goals    Frequency    Min 1X/week       PT Plan      Co-evaluation              AM-PAC PT "6 Clicks" Mobility   Outcome Measure  Help needed turning from your back to your side while in a flat bed without using bedrails?: A Lot Help needed moving from lying on your back to sitting on the side of a flat bed without using bedrails?: A Lot Help needed moving to and from a bed to a chair (including a wheelchair)?: Total Help needed standing up from a chair using your arms (e.g., wheelchair or bedside chair)?: Total Help needed to walk in hospital room?: Total Help needed climbing 3-5 steps with a railing? : Total 6 Click Score: 8    End of Session   Activity Tolerance: Treatment limited secondary to medical complications (Comment) (incontinence) Patient left: in bed;with call bell/phone within reach Nurse Communication: Mobility status;Need for lift equipment PT Visit Diagnosis: Other abnormalities of gait and mobility (R26.89);Muscle weakness (generalized) (M62.81);Difficulty in walking, not elsewhere classified (R26.2)     Time: 0347-4259 PT Time Calculation (min) (ACUTE ONLY): 35 min  Charges:    $Therapeutic Activity: 23-37 mins PT General Charges $$ ACUTE PT VISIT: 1 Visit                     Arlyss Gandy, PT, DPT Acute Rehabilitation Office [phone]    Arlyss Gandy 02/25/2023, 11:41 AM

## 2023-02-25 NOTE — ED Notes (Signed)
 Patient transferred to hospital bed for enhanced comfort.

## 2023-02-25 NOTE — ED Provider Notes (Signed)
Emergency Medicine Observation Re-evaluation Note  Yvonne Bailey is a 82 y.o. female, seen on rounds today.  Pt initially presented to the ED for complaints of Fall Currently, the patient is resting comfortably without complaint.  Physical Exam  BP (!) 179/80   Pulse 63   Temp 98.5 F (36.9 C)   Resp 16   Wt 80.3 kg   SpO2 99%   BMI 27.73 kg/m  Physical Exam Vitals and nursing note reviewed.  Constitutional:      General: She is not in acute distress.    Appearance: She is well-developed.  HENT:     Head: Normocephalic and atraumatic.  Eyes:     Conjunctiva/sclera: Conjunctivae normal.  Cardiovascular:     Rate and Rhythm: Normal rate and regular rhythm.     Heart sounds: No murmur heard. Pulmonary:     Effort: Pulmonary effort is normal. No respiratory distress.     Breath sounds: Normal breath sounds.  Abdominal:     Palpations: Abdomen is soft.     Tenderness: There is no abdominal tenderness.  Musculoskeletal:        General: No swelling.     Cervical back: Neck supple.  Skin:    General: Skin is warm and dry.     Capillary Refill: Capillary refill takes less than 2 seconds.  Neurological:     Mental Status: She is alert.  Psychiatric:        Mood and Affect: Mood normal.     ED Course / MDM  EKG:   I have reviewed the labs performed to date as well as medications administered while in observation.  Recent changes in the last 24 hours include no changes, still just pending placement..  Plan  Current plan is for placement, appears to be a hold-up with insurance.    Anders Simmonds T, DO 02/25/23 1052

## 2023-02-26 DIAGNOSIS — Z9181 History of falling: Secondary | ICD-10-CM | POA: Diagnosis not present

## 2023-02-26 DIAGNOSIS — E785 Hyperlipidemia, unspecified: Secondary | ICD-10-CM | POA: Diagnosis not present

## 2023-02-26 DIAGNOSIS — E876 Hypokalemia: Secondary | ICD-10-CM | POA: Diagnosis not present

## 2023-02-26 DIAGNOSIS — N3001 Acute cystitis with hematuria: Secondary | ICD-10-CM | POA: Diagnosis not present

## 2023-02-26 DIAGNOSIS — Z8673 Personal history of transient ischemic attack (TIA), and cerebral infarction without residual deficits: Secondary | ICD-10-CM | POA: Diagnosis not present

## 2023-02-26 DIAGNOSIS — I10 Essential (primary) hypertension: Secondary | ICD-10-CM | POA: Diagnosis not present

## 2023-02-26 DIAGNOSIS — R262 Difficulty in walking, not elsewhere classified: Secondary | ICD-10-CM | POA: Diagnosis not present

## 2023-02-26 DIAGNOSIS — N3281 Overactive bladder: Secondary | ICD-10-CM | POA: Diagnosis not present

## 2023-02-26 DIAGNOSIS — I130 Hypertensive heart and chronic kidney disease with heart failure and stage 1 through stage 4 chronic kidney disease, or unspecified chronic kidney disease: Secondary | ICD-10-CM | POA: Diagnosis not present

## 2023-02-26 DIAGNOSIS — Z7982 Long term (current) use of aspirin: Secondary | ICD-10-CM | POA: Diagnosis not present

## 2023-02-26 DIAGNOSIS — I5022 Chronic systolic (congestive) heart failure: Secondary | ICD-10-CM | POA: Diagnosis not present

## 2023-02-26 DIAGNOSIS — I2510 Atherosclerotic heart disease of native coronary artery without angina pectoris: Secondary | ICD-10-CM | POA: Diagnosis not present

## 2023-02-26 DIAGNOSIS — N390 Urinary tract infection, site not specified: Secondary | ICD-10-CM | POA: Diagnosis not present

## 2023-02-26 DIAGNOSIS — I509 Heart failure, unspecified: Secondary | ICD-10-CM | POA: Diagnosis not present

## 2023-02-26 DIAGNOSIS — N189 Chronic kidney disease, unspecified: Secondary | ICD-10-CM | POA: Diagnosis not present

## 2023-02-26 DIAGNOSIS — Z7189 Other specified counseling: Secondary | ICD-10-CM | POA: Diagnosis not present

## 2023-02-26 DIAGNOSIS — E039 Hypothyroidism, unspecified: Secondary | ICD-10-CM | POA: Diagnosis not present

## 2023-02-26 DIAGNOSIS — F03B0 Unspecified dementia, moderate, without behavioral disturbance, psychotic disturbance, mood disturbance, and anxiety: Secondary | ICD-10-CM | POA: Diagnosis not present

## 2023-02-26 DIAGNOSIS — R5383 Other fatigue: Secondary | ICD-10-CM | POA: Diagnosis not present

## 2023-02-26 DIAGNOSIS — F32A Depression, unspecified: Secondary | ICD-10-CM | POA: Diagnosis not present

## 2023-02-26 DIAGNOSIS — Z7401 Bed confinement status: Secondary | ICD-10-CM | POA: Diagnosis not present

## 2023-02-26 DIAGNOSIS — N183 Chronic kidney disease, stage 3 unspecified: Secondary | ICD-10-CM | POA: Diagnosis not present

## 2023-02-26 DIAGNOSIS — Z79899 Other long term (current) drug therapy: Secondary | ICD-10-CM | POA: Diagnosis not present

## 2023-02-26 DIAGNOSIS — J189 Pneumonia, unspecified organism: Secondary | ICD-10-CM | POA: Diagnosis not present

## 2023-02-26 DIAGNOSIS — M6281 Muscle weakness (generalized): Secondary | ICD-10-CM | POA: Diagnosis not present

## 2023-02-26 DIAGNOSIS — Z7901 Long term (current) use of anticoagulants: Secondary | ICD-10-CM | POA: Diagnosis not present

## 2023-02-26 DIAGNOSIS — R531 Weakness: Secondary | ICD-10-CM | POA: Diagnosis not present

## 2023-02-26 DIAGNOSIS — F411 Generalized anxiety disorder: Secondary | ICD-10-CM | POA: Diagnosis not present

## 2023-02-26 DIAGNOSIS — F0390 Unspecified dementia without behavioral disturbance: Secondary | ICD-10-CM | POA: Diagnosis not present

## 2023-02-26 DIAGNOSIS — R2689 Other abnormalities of gait and mobility: Secondary | ICD-10-CM | POA: Diagnosis not present

## 2023-02-26 DIAGNOSIS — J22 Unspecified acute lower respiratory infection: Secondary | ICD-10-CM | POA: Diagnosis not present

## 2023-02-26 DIAGNOSIS — I4891 Unspecified atrial fibrillation: Secondary | ICD-10-CM | POA: Diagnosis not present

## 2023-02-26 NOTE — ED Notes (Signed)
RN attempted to call [facility] for report x3- unsuccessful

## 2023-02-26 NOTE — ED Notes (Signed)
Pt states she not hungry right now

## 2023-02-26 NOTE — ED Notes (Signed)
RN attempted to call report to [facility] x2- unsuccessful

## 2023-02-26 NOTE — Progress Notes (Addendum)
9am: CSW received message from Dr. Elayne Snare who states the peer to peer request was approved or patient to go to [facility]. Vesta Mixer #[id number], next review date is 02/28/23.  CSW spoke with Lawerance Cruel in admissions at [facility] to inform her of information - patient can be accepted into the facility today. Patient will go to [place] room 509P via [facility] - RN to call when ready. The number to call for report is [phone].  CSW spoke with patient's son [name] to inform him of discharge plan - he is agreeable and will meet patient at the facility upon her arrival.  8:15am: CSW received peer to peer request for patient - MD needs to call [phone], option 5 by 12pm today.  CSW spoke with Dr. Renaye Rakers to request he complete peer to peer.  Edwin Dada, MSW, LCSW Transitions of Care  Clinical Social Worker II [phone]

## 2023-02-26 NOTE — ED Provider Notes (Addendum)
Emergency Medicine Observation Re-evaluation Note  Yvonne Bailey is a 82 y.o. female, seen on rounds today.  Pt initially presented to the ED for complaints of Fall Currently, the patient is calm and cooperative.  Physical Exam  BP (!) 161/88   Pulse 63   Temp 98.1 F (36.7 C) (Oral)   Resp 17   Wt 80.3 kg   SpO2 100%   BMI 27.73 kg/m  Physical Exam General: Awake. Alert. No acute distress Cardiac: Regular rate rhythm Lungs: Clear to auscultation bilaterally Psych: Calm and cooperative  ED Course / MDM  EKG:   I have reviewed the labs performed to date as well as medications administered while in observation.  Recent changes in the last 24 hours include  social work and physical therapy evaluation complete.  Peer to peer call completed today.  Patient is approved for skilled nursing facility placement  Plan  Current plan is for continued boarding in the ED awaiting skilled nursing facility placement.  4132.  Patient accepted to [facility] rehabilitation.  Will prepare for discharge and transport.  She remained stable at this time    Royanne Foots, DO 02/26/23 0921    Royanne Foots, DO 02/26/23 [phone]

## 2023-02-26 NOTE — ED Notes (Signed)
Patient is resting comfortably. 

## 2023-02-26 NOTE — ED Notes (Signed)
RN attempted to give [facility] report

## 2023-02-26 NOTE — Discharge Instructions (Signed)
You will be transported directly from the emergency department to [facility] and rehabilitation in [place] for further care

## 2023-02-26 NOTE — ED Notes (Signed)
Pt is clean and dry at this time

## 2023-02-27 DIAGNOSIS — F411 Generalized anxiety disorder: Secondary | ICD-10-CM | POA: Diagnosis not present

## 2023-02-27 DIAGNOSIS — N3281 Overactive bladder: Secondary | ICD-10-CM | POA: Diagnosis not present

## 2023-02-27 DIAGNOSIS — F32A Depression, unspecified: Secondary | ICD-10-CM | POA: Diagnosis not present

## 2023-02-27 DIAGNOSIS — Z8673 Personal history of transient ischemic attack (TIA), and cerebral infarction without residual deficits: Secondary | ICD-10-CM | POA: Diagnosis not present

## 2023-02-27 DIAGNOSIS — Z9181 History of falling: Secondary | ICD-10-CM | POA: Diagnosis not present

## 2023-02-27 DIAGNOSIS — N183 Chronic kidney disease, stage 3 unspecified: Secondary | ICD-10-CM | POA: Diagnosis not present

## 2023-02-27 DIAGNOSIS — I2510 Atherosclerotic heart disease of native coronary artery without angina pectoris: Secondary | ICD-10-CM | POA: Diagnosis not present

## 2023-02-27 DIAGNOSIS — I130 Hypertensive heart and chronic kidney disease with heart failure and stage 1 through stage 4 chronic kidney disease, or unspecified chronic kidney disease: Secondary | ICD-10-CM | POA: Diagnosis not present

## 2023-02-27 DIAGNOSIS — I5022 Chronic systolic (congestive) heart failure: Secondary | ICD-10-CM | POA: Diagnosis not present

## 2023-03-07 DIAGNOSIS — Z7189 Other specified counseling: Secondary | ICD-10-CM | POA: Diagnosis not present

## 2023-03-07 DIAGNOSIS — Z8673 Personal history of transient ischemic attack (TIA), and cerebral infarction without residual deficits: Secondary | ICD-10-CM | POA: Diagnosis not present

## 2023-03-07 DIAGNOSIS — N183 Chronic kidney disease, stage 3 unspecified: Secondary | ICD-10-CM | POA: Diagnosis not present

## 2023-03-07 DIAGNOSIS — I130 Hypertensive heart and chronic kidney disease with heart failure and stage 1 through stage 4 chronic kidney disease, or unspecified chronic kidney disease: Secondary | ICD-10-CM | POA: Diagnosis not present

## 2023-03-07 DIAGNOSIS — Z9181 History of falling: Secondary | ICD-10-CM | POA: Diagnosis not present

## 2023-03-07 DIAGNOSIS — I5022 Chronic systolic (congestive) heart failure: Secondary | ICD-10-CM | POA: Diagnosis not present

## 2023-03-07 DIAGNOSIS — E039 Hypothyroidism, unspecified: Secondary | ICD-10-CM | POA: Diagnosis not present

## 2023-03-07 DIAGNOSIS — E876 Hypokalemia: Secondary | ICD-10-CM | POA: Diagnosis not present

## 2023-03-07 DIAGNOSIS — I4891 Unspecified atrial fibrillation: Secondary | ICD-10-CM | POA: Diagnosis not present

## 2023-03-14 ENCOUNTER — Telehealth: Payer: Self-pay

## 2023-03-14 NOTE — Telephone Encounter (Signed)
Spoke with son today °

## 2023-03-14 NOTE — Telephone Encounter (Signed)
 Copied from CRM [id number]. Topic: Clinical - Lab/Test Results >> Mar 13, 2023  4:13 PM Burnard DEL wrote: Reason for CRM: son called in stating that his mom is in a healthcare facility [facility].They are wanting to do a blood test to check her potassium,they concerned about her levels. He was wondering if that will be okay. I advised you to let the staff at [facility] know to send over any request that they are needing in regards to labs. Ra#:[id number] (son-john rogers)

## 2023-03-17 DIAGNOSIS — M6281 Muscle weakness (generalized): Secondary | ICD-10-CM | POA: Diagnosis not present

## 2023-03-17 DIAGNOSIS — F03B0 Unspecified dementia, moderate, without behavioral disturbance, psychotic disturbance, mood disturbance, and anxiety: Secondary | ICD-10-CM | POA: Diagnosis not present

## 2023-03-17 DIAGNOSIS — Z9181 History of falling: Secondary | ICD-10-CM | POA: Diagnosis not present

## 2023-03-17 DIAGNOSIS — R262 Difficulty in walking, not elsewhere classified: Secondary | ICD-10-CM | POA: Diagnosis not present

## 2023-03-17 DIAGNOSIS — Z8673 Personal history of transient ischemic attack (TIA), and cerebral infarction without residual deficits: Secondary | ICD-10-CM | POA: Diagnosis not present

## 2023-03-17 DIAGNOSIS — F32A Depression, unspecified: Secondary | ICD-10-CM | POA: Diagnosis not present

## 2023-03-19 DIAGNOSIS — M6281 Muscle weakness (generalized): Secondary | ICD-10-CM | POA: Diagnosis not present

## 2023-03-19 DIAGNOSIS — R262 Difficulty in walking, not elsewhere classified: Secondary | ICD-10-CM | POA: Diagnosis not present

## 2023-03-19 DIAGNOSIS — F03B0 Unspecified dementia, moderate, without behavioral disturbance, psychotic disturbance, mood disturbance, and anxiety: Secondary | ICD-10-CM | POA: Diagnosis not present

## 2023-03-19 DIAGNOSIS — Z9181 History of falling: Secondary | ICD-10-CM | POA: Diagnosis not present

## 2023-03-19 DIAGNOSIS — Z8673 Personal history of transient ischemic attack (TIA), and cerebral infarction without residual deficits: Secondary | ICD-10-CM | POA: Diagnosis not present

## 2023-03-25 DIAGNOSIS — Z9181 History of falling: Secondary | ICD-10-CM | POA: Diagnosis not present

## 2023-03-25 DIAGNOSIS — R262 Difficulty in walking, not elsewhere classified: Secondary | ICD-10-CM | POA: Diagnosis not present

## 2023-03-25 DIAGNOSIS — Z8673 Personal history of transient ischemic attack (TIA), and cerebral infarction without residual deficits: Secondary | ICD-10-CM | POA: Diagnosis not present

## 2023-03-25 DIAGNOSIS — M6281 Muscle weakness (generalized): Secondary | ICD-10-CM | POA: Diagnosis not present

## 2023-03-25 DIAGNOSIS — F03B0 Unspecified dementia, moderate, without behavioral disturbance, psychotic disturbance, mood disturbance, and anxiety: Secondary | ICD-10-CM | POA: Diagnosis not present

## 2023-03-27 DIAGNOSIS — R262 Difficulty in walking, not elsewhere classified: Secondary | ICD-10-CM | POA: Diagnosis not present

## 2023-03-27 DIAGNOSIS — F03B0 Unspecified dementia, moderate, without behavioral disturbance, psychotic disturbance, mood disturbance, and anxiety: Secondary | ICD-10-CM | POA: Diagnosis not present

## 2023-03-27 DIAGNOSIS — Z8673 Personal history of transient ischemic attack (TIA), and cerebral infarction without residual deficits: Secondary | ICD-10-CM | POA: Diagnosis not present

## 2023-03-27 DIAGNOSIS — Z9181 History of falling: Secondary | ICD-10-CM | POA: Diagnosis not present

## 2023-03-27 DIAGNOSIS — M6281 Muscle weakness (generalized): Secondary | ICD-10-CM | POA: Diagnosis not present

## 2023-03-31 DIAGNOSIS — Z8673 Personal history of transient ischemic attack (TIA), and cerebral infarction without residual deficits: Secondary | ICD-10-CM | POA: Diagnosis not present

## 2023-03-31 DIAGNOSIS — Z9181 History of falling: Secondary | ICD-10-CM | POA: Diagnosis not present

## 2023-03-31 DIAGNOSIS — R262 Difficulty in walking, not elsewhere classified: Secondary | ICD-10-CM | POA: Diagnosis not present

## 2023-03-31 DIAGNOSIS — M6281 Muscle weakness (generalized): Secondary | ICD-10-CM | POA: Diagnosis not present

## 2023-03-31 DIAGNOSIS — F03B0 Unspecified dementia, moderate, without behavioral disturbance, psychotic disturbance, mood disturbance, and anxiety: Secondary | ICD-10-CM | POA: Diagnosis not present

## 2023-04-02 DIAGNOSIS — Z8673 Personal history of transient ischemic attack (TIA), and cerebral infarction without residual deficits: Secondary | ICD-10-CM | POA: Diagnosis not present

## 2023-04-02 DIAGNOSIS — F03B0 Unspecified dementia, moderate, without behavioral disturbance, psychotic disturbance, mood disturbance, and anxiety: Secondary | ICD-10-CM | POA: Diagnosis not present

## 2023-04-02 DIAGNOSIS — R262 Difficulty in walking, not elsewhere classified: Secondary | ICD-10-CM | POA: Diagnosis not present

## 2023-04-02 DIAGNOSIS — M6281 Muscle weakness (generalized): Secondary | ICD-10-CM | POA: Diagnosis not present

## 2023-04-02 DIAGNOSIS — Z9181 History of falling: Secondary | ICD-10-CM | POA: Diagnosis not present

## 2023-04-03 DIAGNOSIS — N183 Chronic kidney disease, stage 3 unspecified: Secondary | ICD-10-CM | POA: Diagnosis not present

## 2023-04-03 DIAGNOSIS — Z7901 Long term (current) use of anticoagulants: Secondary | ICD-10-CM | POA: Diagnosis not present

## 2023-04-03 DIAGNOSIS — I5022 Chronic systolic (congestive) heart failure: Secondary | ICD-10-CM | POA: Diagnosis not present

## 2023-04-03 DIAGNOSIS — I4891 Unspecified atrial fibrillation: Secondary | ICD-10-CM | POA: Diagnosis not present

## 2023-04-03 DIAGNOSIS — J22 Unspecified acute lower respiratory infection: Secondary | ICD-10-CM | POA: Diagnosis not present

## 2023-04-03 DIAGNOSIS — I130 Hypertensive heart and chronic kidney disease with heart failure and stage 1 through stage 4 chronic kidney disease, or unspecified chronic kidney disease: Secondary | ICD-10-CM | POA: Diagnosis not present

## 2023-04-03 DIAGNOSIS — E039 Hypothyroidism, unspecified: Secondary | ICD-10-CM | POA: Diagnosis not present

## 2023-04-03 DIAGNOSIS — E876 Hypokalemia: Secondary | ICD-10-CM | POA: Diagnosis not present

## 2023-04-05 DIAGNOSIS — R262 Difficulty in walking, not elsewhere classified: Secondary | ICD-10-CM | POA: Diagnosis not present

## 2023-04-05 DIAGNOSIS — N390 Urinary tract infection, site not specified: Secondary | ICD-10-CM | POA: Diagnosis not present

## 2023-04-05 DIAGNOSIS — M6281 Muscle weakness (generalized): Secondary | ICD-10-CM | POA: Diagnosis not present

## 2023-04-05 DIAGNOSIS — Z9181 History of falling: Secondary | ICD-10-CM | POA: Diagnosis not present

## 2023-04-05 DIAGNOSIS — N3001 Acute cystitis with hematuria: Secondary | ICD-10-CM | POA: Diagnosis not present

## 2023-04-06 DIAGNOSIS — Z9181 History of falling: Secondary | ICD-10-CM | POA: Diagnosis not present

## 2023-04-06 DIAGNOSIS — N390 Urinary tract infection, site not specified: Secondary | ICD-10-CM | POA: Diagnosis not present

## 2023-04-06 DIAGNOSIS — N3001 Acute cystitis with hematuria: Secondary | ICD-10-CM | POA: Diagnosis not present

## 2023-04-06 DIAGNOSIS — R262 Difficulty in walking, not elsewhere classified: Secondary | ICD-10-CM | POA: Diagnosis not present

## 2023-04-06 DIAGNOSIS — M6281 Muscle weakness (generalized): Secondary | ICD-10-CM | POA: Diagnosis not present

## 2023-04-07 DIAGNOSIS — N3001 Acute cystitis with hematuria: Secondary | ICD-10-CM | POA: Diagnosis not present

## 2023-04-07 DIAGNOSIS — Z8673 Personal history of transient ischemic attack (TIA), and cerebral infarction without residual deficits: Secondary | ICD-10-CM | POA: Diagnosis not present

## 2023-04-07 DIAGNOSIS — Z9181 History of falling: Secondary | ICD-10-CM | POA: Diagnosis not present

## 2023-04-07 DIAGNOSIS — J189 Pneumonia, unspecified organism: Secondary | ICD-10-CM | POA: Diagnosis not present

## 2023-04-07 DIAGNOSIS — M6281 Muscle weakness (generalized): Secondary | ICD-10-CM | POA: Diagnosis not present

## 2023-04-07 DIAGNOSIS — F03B0 Unspecified dementia, moderate, without behavioral disturbance, psychotic disturbance, mood disturbance, and anxiety: Secondary | ICD-10-CM | POA: Diagnosis not present

## 2023-04-07 DIAGNOSIS — R262 Difficulty in walking, not elsewhere classified: Secondary | ICD-10-CM | POA: Diagnosis not present

## 2023-04-07 DIAGNOSIS — N390 Urinary tract infection, site not specified: Secondary | ICD-10-CM | POA: Diagnosis not present

## 2023-04-08 DIAGNOSIS — M6281 Muscle weakness (generalized): Secondary | ICD-10-CM | POA: Diagnosis not present

## 2023-04-08 DIAGNOSIS — N3001 Acute cystitis with hematuria: Secondary | ICD-10-CM | POA: Diagnosis not present

## 2023-04-08 DIAGNOSIS — Z9181 History of falling: Secondary | ICD-10-CM | POA: Diagnosis not present

## 2023-04-08 DIAGNOSIS — R262 Difficulty in walking, not elsewhere classified: Secondary | ICD-10-CM | POA: Diagnosis not present

## 2023-04-08 DIAGNOSIS — N390 Urinary tract infection, site not specified: Secondary | ICD-10-CM | POA: Diagnosis not present

## 2023-04-09 DIAGNOSIS — M6281 Muscle weakness (generalized): Secondary | ICD-10-CM | POA: Diagnosis not present

## 2023-04-09 DIAGNOSIS — N390 Urinary tract infection, site not specified: Secondary | ICD-10-CM | POA: Diagnosis not present

## 2023-04-09 DIAGNOSIS — N3001 Acute cystitis with hematuria: Secondary | ICD-10-CM | POA: Diagnosis not present

## 2023-04-09 DIAGNOSIS — Z9181 History of falling: Secondary | ICD-10-CM | POA: Diagnosis not present

## 2023-04-09 DIAGNOSIS — R262 Difficulty in walking, not elsewhere classified: Secondary | ICD-10-CM | POA: Diagnosis not present

## 2023-04-10 ENCOUNTER — Other Ambulatory Visit: Payer: Self-pay | Admitting: Internal Medicine

## 2023-04-10 DIAGNOSIS — Z9181 History of falling: Secondary | ICD-10-CM | POA: Diagnosis not present

## 2023-04-10 DIAGNOSIS — M6281 Muscle weakness (generalized): Secondary | ICD-10-CM | POA: Diagnosis not present

## 2023-04-10 DIAGNOSIS — N390 Urinary tract infection, site not specified: Secondary | ICD-10-CM | POA: Diagnosis not present

## 2023-04-10 DIAGNOSIS — R262 Difficulty in walking, not elsewhere classified: Secondary | ICD-10-CM | POA: Diagnosis not present

## 2023-04-10 DIAGNOSIS — N3001 Acute cystitis with hematuria: Secondary | ICD-10-CM | POA: Diagnosis not present

## 2023-04-11 DIAGNOSIS — Z8673 Personal history of transient ischemic attack (TIA), and cerebral infarction without residual deficits: Secondary | ICD-10-CM | POA: Diagnosis not present

## 2023-04-11 DIAGNOSIS — N3001 Acute cystitis with hematuria: Secondary | ICD-10-CM | POA: Diagnosis not present

## 2023-04-11 DIAGNOSIS — F03B0 Unspecified dementia, moderate, without behavioral disturbance, psychotic disturbance, mood disturbance, and anxiety: Secondary | ICD-10-CM | POA: Diagnosis not present

## 2023-04-11 DIAGNOSIS — N390 Urinary tract infection, site not specified: Secondary | ICD-10-CM | POA: Diagnosis not present

## 2023-04-11 DIAGNOSIS — Z9181 History of falling: Secondary | ICD-10-CM | POA: Diagnosis not present

## 2023-04-11 DIAGNOSIS — R262 Difficulty in walking, not elsewhere classified: Secondary | ICD-10-CM | POA: Diagnosis not present

## 2023-04-11 DIAGNOSIS — M6281 Muscle weakness (generalized): Secondary | ICD-10-CM | POA: Diagnosis not present

## 2023-04-12 DIAGNOSIS — R262 Difficulty in walking, not elsewhere classified: Secondary | ICD-10-CM | POA: Diagnosis not present

## 2023-04-12 DIAGNOSIS — M6281 Muscle weakness (generalized): Secondary | ICD-10-CM | POA: Diagnosis not present

## 2023-04-12 DIAGNOSIS — N390 Urinary tract infection, site not specified: Secondary | ICD-10-CM | POA: Diagnosis not present

## 2023-04-14 DIAGNOSIS — N390 Urinary tract infection, site not specified: Secondary | ICD-10-CM | POA: Diagnosis not present

## 2023-04-14 DIAGNOSIS — M6281 Muscle weakness (generalized): Secondary | ICD-10-CM | POA: Diagnosis not present

## 2023-04-14 DIAGNOSIS — Z8673 Personal history of transient ischemic attack (TIA), and cerebral infarction without residual deficits: Secondary | ICD-10-CM | POA: Diagnosis not present

## 2023-04-14 DIAGNOSIS — R262 Difficulty in walking, not elsewhere classified: Secondary | ICD-10-CM | POA: Diagnosis not present

## 2023-04-14 DIAGNOSIS — F03B0 Unspecified dementia, moderate, without behavioral disturbance, psychotic disturbance, mood disturbance, and anxiety: Secondary | ICD-10-CM | POA: Diagnosis not present

## 2023-04-14 DIAGNOSIS — Z9181 History of falling: Secondary | ICD-10-CM | POA: Diagnosis not present

## 2023-04-15 DIAGNOSIS — M6281 Muscle weakness (generalized): Secondary | ICD-10-CM | POA: Diagnosis not present

## 2023-04-15 DIAGNOSIS — N390 Urinary tract infection, site not specified: Secondary | ICD-10-CM | POA: Diagnosis not present

## 2023-04-15 DIAGNOSIS — R262 Difficulty in walking, not elsewhere classified: Secondary | ICD-10-CM | POA: Diagnosis not present

## 2023-04-16 DIAGNOSIS — F03B0 Unspecified dementia, moderate, without behavioral disturbance, psychotic disturbance, mood disturbance, and anxiety: Secondary | ICD-10-CM | POA: Diagnosis not present

## 2023-04-16 DIAGNOSIS — M6281 Muscle weakness (generalized): Secondary | ICD-10-CM | POA: Diagnosis not present

## 2023-04-16 DIAGNOSIS — Z8673 Personal history of transient ischemic attack (TIA), and cerebral infarction without residual deficits: Secondary | ICD-10-CM | POA: Diagnosis not present

## 2023-04-16 DIAGNOSIS — R262 Difficulty in walking, not elsewhere classified: Secondary | ICD-10-CM | POA: Diagnosis not present

## 2023-04-16 DIAGNOSIS — Z9181 History of falling: Secondary | ICD-10-CM | POA: Diagnosis not present

## 2023-04-20 DIAGNOSIS — I130 Hypertensive heart and chronic kidney disease with heart failure and stage 1 through stage 4 chronic kidney disease, or unspecified chronic kidney disease: Secondary | ICD-10-CM | POA: Diagnosis not present

## 2023-04-20 DIAGNOSIS — I5022 Chronic systolic (congestive) heart failure: Secondary | ICD-10-CM | POA: Diagnosis not present

## 2023-04-20 DIAGNOSIS — E039 Hypothyroidism, unspecified: Secondary | ICD-10-CM | POA: Diagnosis not present

## 2023-04-20 DIAGNOSIS — Z7901 Long term (current) use of anticoagulants: Secondary | ICD-10-CM | POA: Diagnosis not present

## 2023-04-20 DIAGNOSIS — E876 Hypokalemia: Secondary | ICD-10-CM | POA: Diagnosis not present

## 2023-04-20 DIAGNOSIS — J189 Pneumonia, unspecified organism: Secondary | ICD-10-CM | POA: Diagnosis not present

## 2023-04-20 DIAGNOSIS — I4891 Unspecified atrial fibrillation: Secondary | ICD-10-CM | POA: Diagnosis not present

## 2023-04-20 DIAGNOSIS — N183 Chronic kidney disease, stage 3 unspecified: Secondary | ICD-10-CM | POA: Diagnosis not present

## 2023-04-20 DIAGNOSIS — F32A Depression, unspecified: Secondary | ICD-10-CM | POA: Diagnosis not present

## 2023-04-21 DIAGNOSIS — Z9189 Other specified personal risk factors, not elsewhere classified: Secondary | ICD-10-CM | POA: Diagnosis not present

## 2023-04-28 DIAGNOSIS — F03B0 Unspecified dementia, moderate, without behavioral disturbance, psychotic disturbance, mood disturbance, and anxiety: Secondary | ICD-10-CM | POA: Diagnosis not present

## 2023-04-28 DIAGNOSIS — F03B11 Unspecified dementia, moderate, with agitation: Secondary | ICD-10-CM | POA: Diagnosis not present

## 2023-04-28 DIAGNOSIS — F32A Depression, unspecified: Secondary | ICD-10-CM | POA: Diagnosis not present

## 2023-05-12 DIAGNOSIS — F32A Depression, unspecified: Secondary | ICD-10-CM | POA: Diagnosis not present

## 2023-05-12 DIAGNOSIS — F03B11 Unspecified dementia, moderate, with agitation: Secondary | ICD-10-CM | POA: Diagnosis not present

## 2023-05-21 DIAGNOSIS — E039 Hypothyroidism, unspecified: Secondary | ICD-10-CM | POA: Diagnosis not present

## 2023-05-21 DIAGNOSIS — F03911 Unspecified dementia, unspecified severity, with agitation: Secondary | ICD-10-CM | POA: Diagnosis not present

## 2023-05-21 DIAGNOSIS — I5022 Chronic systolic (congestive) heart failure: Secondary | ICD-10-CM | POA: Diagnosis not present

## 2023-05-21 DIAGNOSIS — N183 Chronic kidney disease, stage 3 unspecified: Secondary | ICD-10-CM | POA: Diagnosis not present

## 2023-05-21 DIAGNOSIS — E876 Hypokalemia: Secondary | ICD-10-CM | POA: Diagnosis not present

## 2023-05-21 DIAGNOSIS — I4891 Unspecified atrial fibrillation: Secondary | ICD-10-CM | POA: Diagnosis not present

## 2023-05-22 DIAGNOSIS — F419 Anxiety disorder, unspecified: Secondary | ICD-10-CM | POA: Diagnosis not present

## 2023-05-26 DIAGNOSIS — F03B11 Unspecified dementia, moderate, with agitation: Secondary | ICD-10-CM | POA: Diagnosis not present

## 2023-05-26 DIAGNOSIS — F32A Depression, unspecified: Secondary | ICD-10-CM | POA: Diagnosis not present

## 2023-05-27 DIAGNOSIS — F03911 Unspecified dementia, unspecified severity, with agitation: Secondary | ICD-10-CM | POA: Diagnosis not present

## 2023-06-16 DIAGNOSIS — F03911 Unspecified dementia, unspecified severity, with agitation: Secondary | ICD-10-CM | POA: Diagnosis not present

## 2023-06-17 DIAGNOSIS — F0394 Unspecified dementia, unspecified severity, with anxiety: Secondary | ICD-10-CM | POA: Diagnosis not present

## 2023-06-17 DIAGNOSIS — N189 Chronic kidney disease, unspecified: Secondary | ICD-10-CM | POA: Diagnosis not present

## 2023-06-17 DIAGNOSIS — F03911 Unspecified dementia, unspecified severity, with agitation: Secondary | ICD-10-CM | POA: Diagnosis not present

## 2023-06-17 DIAGNOSIS — W19XXXA Unspecified fall, initial encounter: Secondary | ICD-10-CM | POA: Diagnosis not present

## 2023-06-17 DIAGNOSIS — I509 Heart failure, unspecified: Secondary | ICD-10-CM | POA: Diagnosis not present

## 2023-06-17 DIAGNOSIS — F419 Anxiety disorder, unspecified: Secondary | ICD-10-CM | POA: Diagnosis not present

## 2023-06-17 DIAGNOSIS — I130 Hypertensive heart and chronic kidney disease with heart failure and stage 1 through stage 4 chronic kidney disease, or unspecified chronic kidney disease: Secondary | ICD-10-CM | POA: Diagnosis not present

## 2023-06-23 ENCOUNTER — Telehealth: Payer: Self-pay | Admitting: Internal Medicine

## 2023-06-23 NOTE — Telephone Encounter (Signed)
 Copied from CRM [id number]. Topic: General - Other >> Jun 23, 2023  1:39 PM Albertha Alosa wrote: Reason for CRM: Patient son called in regarding getting patient medicaid stating patient needs an SL 2 Form from PCP to be filled out, would like for someone to give him a callback regarding this at  [phone]

## 2023-07-02 NOTE — Telephone Encounter (Signed)
 Message was left for son last week.  Waiting for him to call back to see where form needs to be sent to or he wants to come by and pick up form.

## 2023-07-07 DIAGNOSIS — F32A Depression, unspecified: Secondary | ICD-10-CM | POA: Diagnosis not present

## 2023-07-07 DIAGNOSIS — F03B11 Unspecified dementia, moderate, with agitation: Secondary | ICD-10-CM | POA: Diagnosis not present

## 2023-07-09 DIAGNOSIS — N390 Urinary tract infection, site not specified: Secondary | ICD-10-CM | POA: Diagnosis not present

## 2023-07-09 DIAGNOSIS — R2689 Other abnormalities of gait and mobility: Secondary | ICD-10-CM | POA: Diagnosis not present

## 2023-07-09 DIAGNOSIS — M6281 Muscle weakness (generalized): Secondary | ICD-10-CM | POA: Diagnosis not present

## 2023-07-09 DIAGNOSIS — R293 Abnormal posture: Secondary | ICD-10-CM | POA: Diagnosis not present

## 2023-07-10 DIAGNOSIS — M6281 Muscle weakness (generalized): Secondary | ICD-10-CM | POA: Diagnosis not present

## 2023-07-10 DIAGNOSIS — R2689 Other abnormalities of gait and mobility: Secondary | ICD-10-CM | POA: Diagnosis not present

## 2023-07-10 DIAGNOSIS — N390 Urinary tract infection, site not specified: Secondary | ICD-10-CM | POA: Diagnosis not present

## 2023-07-10 DIAGNOSIS — R293 Abnormal posture: Secondary | ICD-10-CM | POA: Diagnosis not present

## 2023-07-11 DIAGNOSIS — N390 Urinary tract infection, site not specified: Secondary | ICD-10-CM | POA: Diagnosis not present

## 2023-07-11 DIAGNOSIS — R2689 Other abnormalities of gait and mobility: Secondary | ICD-10-CM | POA: Diagnosis not present

## 2023-07-11 DIAGNOSIS — R293 Abnormal posture: Secondary | ICD-10-CM | POA: Diagnosis not present

## 2023-07-11 DIAGNOSIS — M6281 Muscle weakness (generalized): Secondary | ICD-10-CM | POA: Diagnosis not present

## 2023-07-12 DIAGNOSIS — R2689 Other abnormalities of gait and mobility: Secondary | ICD-10-CM | POA: Diagnosis not present

## 2023-07-12 DIAGNOSIS — M6281 Muscle weakness (generalized): Secondary | ICD-10-CM | POA: Diagnosis not present

## 2023-07-12 DIAGNOSIS — R293 Abnormal posture: Secondary | ICD-10-CM | POA: Diagnosis not present

## 2023-07-12 DIAGNOSIS — N390 Urinary tract infection, site not specified: Secondary | ICD-10-CM | POA: Diagnosis not present

## 2023-07-14 DIAGNOSIS — N390 Urinary tract infection, site not specified: Secondary | ICD-10-CM | POA: Diagnosis not present

## 2023-07-14 DIAGNOSIS — R2689 Other abnormalities of gait and mobility: Secondary | ICD-10-CM | POA: Diagnosis not present

## 2023-07-14 DIAGNOSIS — M6281 Muscle weakness (generalized): Secondary | ICD-10-CM | POA: Diagnosis not present

## 2023-07-14 DIAGNOSIS — R293 Abnormal posture: Secondary | ICD-10-CM | POA: Diagnosis not present

## 2023-07-16 ENCOUNTER — Telehealth: Payer: Self-pay | Admitting: Internal Medicine

## 2023-07-16 NOTE — Telephone Encounter (Unsigned)
 Copied from CRM [id number]. Topic: General - Other >> Jul 16, 2023  1:09 PM Clyde Darling P wrote: Reason for CRM: Pt son Autry Legions advise he is needing the FL2 Form completed by May 14 for Pt, would prefer to have it sent via email [email or web address], can be reached at [phone]

## 2023-07-21 DIAGNOSIS — I130 Hypertensive heart and chronic kidney disease with heart failure and stage 1 through stage 4 chronic kidney disease, or unspecified chronic kidney disease: Secondary | ICD-10-CM | POA: Diagnosis not present

## 2023-07-21 DIAGNOSIS — R2689 Other abnormalities of gait and mobility: Secondary | ICD-10-CM | POA: Diagnosis not present

## 2023-07-21 DIAGNOSIS — I4891 Unspecified atrial fibrillation: Secondary | ICD-10-CM | POA: Diagnosis not present

## 2023-07-21 DIAGNOSIS — Z7901 Long term (current) use of anticoagulants: Secondary | ICD-10-CM | POA: Diagnosis not present

## 2023-07-21 DIAGNOSIS — I5022 Chronic systolic (congestive) heart failure: Secondary | ICD-10-CM | POA: Diagnosis not present

## 2023-07-21 DIAGNOSIS — N189 Chronic kidney disease, unspecified: Secondary | ICD-10-CM | POA: Diagnosis not present

## 2023-07-21 DIAGNOSIS — N390 Urinary tract infection, site not specified: Secondary | ICD-10-CM | POA: Diagnosis not present

## 2023-07-21 DIAGNOSIS — Z9181 History of falling: Secondary | ICD-10-CM | POA: Diagnosis not present

## 2023-07-21 DIAGNOSIS — M6281 Muscle weakness (generalized): Secondary | ICD-10-CM | POA: Diagnosis not present

## 2023-07-21 DIAGNOSIS — E039 Hypothyroidism, unspecified: Secondary | ICD-10-CM | POA: Diagnosis not present

## 2023-07-21 DIAGNOSIS — F419 Anxiety disorder, unspecified: Secondary | ICD-10-CM | POA: Diagnosis not present

## 2023-07-21 DIAGNOSIS — I2510 Atherosclerotic heart disease of native coronary artery without angina pectoris: Secondary | ICD-10-CM | POA: Diagnosis not present

## 2023-07-21 DIAGNOSIS — R293 Abnormal posture: Secondary | ICD-10-CM | POA: Diagnosis not present

## 2023-07-22 DIAGNOSIS — R2689 Other abnormalities of gait and mobility: Secondary | ICD-10-CM | POA: Diagnosis not present

## 2023-07-22 DIAGNOSIS — R293 Abnormal posture: Secondary | ICD-10-CM | POA: Diagnosis not present

## 2023-07-22 DIAGNOSIS — M6281 Muscle weakness (generalized): Secondary | ICD-10-CM | POA: Diagnosis not present

## 2023-07-22 DIAGNOSIS — N390 Urinary tract infection, site not specified: Secondary | ICD-10-CM | POA: Diagnosis not present

## 2023-07-24 DIAGNOSIS — R2689 Other abnormalities of gait and mobility: Secondary | ICD-10-CM | POA: Diagnosis not present

## 2023-07-24 DIAGNOSIS — M6281 Muscle weakness (generalized): Secondary | ICD-10-CM | POA: Diagnosis not present

## 2023-07-24 DIAGNOSIS — R293 Abnormal posture: Secondary | ICD-10-CM | POA: Diagnosis not present

## 2023-07-24 DIAGNOSIS — N390 Urinary tract infection, site not specified: Secondary | ICD-10-CM | POA: Diagnosis not present

## 2023-07-31 ENCOUNTER — Encounter: Payer: Self-pay | Admitting: Internal Medicine

## 2023-07-31 NOTE — Progress Notes (Signed)
 Subjective:    Patient ID: Yvonne Bailey, female    DOB: 1940-03-28, 83 y.o.   MRN: [id number]     HPI Yvonne Bailey is here for follow up of her chronic medical problems.    Medications and allergies reviewed with patient and updated if appropriate.  Current Outpatient Medications on File Prior to Visit  Medication Sig Dispense Refill  . aspirin  EC 81 MG tablet Take 81 mg by mouth daily. Swallow whole.    . atorvastatin  (LIPITOR ) 40 MG tablet TAKE 2 TABLETS(80 MG) BY MOUTH DAILY (Patient taking differently: Take 80 mg by mouth daily.) 180 tablet 1  . dorzolamide -timolol  (COSOPT ) 2-0.5 % ophthalmic solution Place 1 drop into both eyes in the morning, at noon, and at bedtime.    . ELIQUIS  5 MG TABS tablet TAKE 1 TABLET(5 MG) BY MOUTH TWICE DAILY (Patient taking differently: Take 5 mg by mouth 2 (two) times daily.) 180 tablet 0  . furosemide  (LASIX ) 20 MG tablet Take 1 tablet (20 mg total) by mouth daily. (Patient taking differently: Take 20 mg by mouth daily as needed for edema.) 30 tablet 0  . levothyroxine  (SYNTHROID ) 100 MCG tablet TAKE 1 TABLET(100 MCG) BY MOUTH DAILY 90 tablet 0  . losartan  (COZAAR ) 25 MG tablet Take 1 tablet (25 mg total) by mouth daily. 90 tablet 1  . metoprolol  tartrate (LOPRESSOR ) 25 MG tablet Take 0.5 tablets (12.5 mg total) by mouth 2 (two) times daily. 90 tablet 1  . MYRBETRIQ  25 MG TB24 tablet TAKE 1 TABLET(25 MG) BY MOUTH DAILY (Patient taking differently: Take 25 mg by mouth daily.) 30 tablet 5  . Netarsudil  Dimesylate (RHOPRESSA) 0.02 % SOLN Place 1 drop into both eyes every evening.    . nitroGLYCERIN  (NITROSTAT ) 0.4 MG SL tablet Place 1 tablet (0.4 mg total) under the tongue every 5 (five) minutes as needed for chest pain. (Patient not taking: Reported on 02/22/2023) 25 tablet 1  . polyethylene glycol (MIRALAX ) 17 g packet Take 17 g by mouth daily as needed for mild constipation.    . senna-docusate (SENOKOT-S) 8.6-50 MG tablet Take 2 tablets by  mouth 2 (two) times daily. 30 tablet 0  . sertraline  (ZOLOFT ) 100 MG tablet Take 1 tablet (100 mg total) by mouth daily. 90 tablet 1   No current facility-administered medications on file prior to visit.     Review of Systems     Objective:  There were no vitals filed for this visit. BP Readings from Last 3 Encounters:  02/26/23 (!) 174/94  01/31/23 126/78  01/24/23 (!) 143/69   Wt Readings from Last 3 Encounters:  02/22/23 177 lb 0.5 oz (80.3 kg)  01/31/23 177 lb (80.3 kg)  01/22/23 178 lb 9.2 oz (81 kg)   There is no height or weight on file to calculate BMI.    Physical Exam     Lab Results  Component Value Date   WBC 7.6 02/21/2023   HGB 13.1 02/21/2023   HCT 39.1 02/21/2023   PLT 218 02/21/2023   GLUCOSE 125 (H) 02/21/2023   CHOL 104 11/24/2022   TRIG 39 11/24/2022   HDL 41 11/24/2022   LDLCALC 55 11/24/2022   ALT 15 02/21/2023   AST 32 02/21/2023   NA 140 02/21/2023   K 3.2 (L) 02/21/2023   CL 104 02/21/2023   CREATININE 1.13 (H) 02/21/2023   BUN 10 02/21/2023   CO2 27 02/21/2023   TSH 0.341 (L) 11/23/2022   INR 1.2  12/12/2022   HGBA1C 5.3 11/24/2022     Assessment & Plan:    See Problem List for Assessment and Plan of chronic medical problems.    This encounter was created in error - please disregard.

## 2023-07-31 NOTE — Patient Instructions (Addendum)
      Blood work was ordered.       Medications changes include :   None    A referral was ordered and someone will call you to schedule an appointment.     Return in about 6 months (around 02/01/2024) for follow up.

## 2023-08-01 ENCOUNTER — Encounter: Payer: Medicare HMO | Admitting: Internal Medicine

## 2023-09-08 DIAGNOSIS — R41841 Cognitive communication deficit: Secondary | ICD-10-CM | POA: Diagnosis not present

## 2023-09-08 DIAGNOSIS — N390 Urinary tract infection, site not specified: Secondary | ICD-10-CM | POA: Diagnosis not present

## 2023-09-08 DIAGNOSIS — R1311 Dysphagia, oral phase: Secondary | ICD-10-CM | POA: Diagnosis not present

## 2023-09-09 DIAGNOSIS — R41841 Cognitive communication deficit: Secondary | ICD-10-CM | POA: Diagnosis not present

## 2023-09-09 DIAGNOSIS — R1311 Dysphagia, oral phase: Secondary | ICD-10-CM | POA: Diagnosis not present

## 2023-09-09 DIAGNOSIS — N390 Urinary tract infection, site not specified: Secondary | ICD-10-CM | POA: Diagnosis not present

## 2023-09-10 DIAGNOSIS — R1311 Dysphagia, oral phase: Secondary | ICD-10-CM | POA: Diagnosis not present

## 2023-09-10 DIAGNOSIS — R41841 Cognitive communication deficit: Secondary | ICD-10-CM | POA: Diagnosis not present

## 2023-09-10 DIAGNOSIS — N390 Urinary tract infection, site not specified: Secondary | ICD-10-CM | POA: Diagnosis not present

## 2023-09-11 DIAGNOSIS — R1311 Dysphagia, oral phase: Secondary | ICD-10-CM | POA: Diagnosis not present

## 2023-09-11 DIAGNOSIS — R41841 Cognitive communication deficit: Secondary | ICD-10-CM | POA: Diagnosis not present

## 2023-09-11 DIAGNOSIS — N390 Urinary tract infection, site not specified: Secondary | ICD-10-CM | POA: Diagnosis not present

## 2023-09-12 DIAGNOSIS — R41841 Cognitive communication deficit: Secondary | ICD-10-CM | POA: Diagnosis not present

## 2023-09-12 DIAGNOSIS — R1311 Dysphagia, oral phase: Secondary | ICD-10-CM | POA: Diagnosis not present

## 2023-09-12 DIAGNOSIS — N390 Urinary tract infection, site not specified: Secondary | ICD-10-CM | POA: Diagnosis not present

## 2023-09-15 DIAGNOSIS — N390 Urinary tract infection, site not specified: Secondary | ICD-10-CM | POA: Diagnosis not present

## 2023-09-15 DIAGNOSIS — R41841 Cognitive communication deficit: Secondary | ICD-10-CM | POA: Diagnosis not present

## 2023-09-15 DIAGNOSIS — R1311 Dysphagia, oral phase: Secondary | ICD-10-CM | POA: Diagnosis not present

## 2023-09-16 DIAGNOSIS — N390 Urinary tract infection, site not specified: Secondary | ICD-10-CM | POA: Diagnosis not present

## 2023-09-16 DIAGNOSIS — R41841 Cognitive communication deficit: Secondary | ICD-10-CM | POA: Diagnosis not present

## 2023-09-16 DIAGNOSIS — R1311 Dysphagia, oral phase: Secondary | ICD-10-CM | POA: Diagnosis not present

## 2023-09-17 DIAGNOSIS — R41841 Cognitive communication deficit: Secondary | ICD-10-CM | POA: Diagnosis not present

## 2023-09-17 DIAGNOSIS — L602 Onychogryphosis: Secondary | ICD-10-CM | POA: Diagnosis not present

## 2023-09-17 DIAGNOSIS — R1311 Dysphagia, oral phase: Secondary | ICD-10-CM | POA: Diagnosis not present

## 2023-09-17 DIAGNOSIS — L84 Corns and callosities: Secondary | ICD-10-CM | POA: Diagnosis not present

## 2023-09-17 DIAGNOSIS — I739 Peripheral vascular disease, unspecified: Secondary | ICD-10-CM | POA: Diagnosis not present

## 2023-09-17 DIAGNOSIS — L603 Nail dystrophy: Secondary | ICD-10-CM | POA: Diagnosis not present

## 2023-09-17 DIAGNOSIS — N390 Urinary tract infection, site not specified: Secondary | ICD-10-CM | POA: Diagnosis not present

## 2023-09-18 DIAGNOSIS — N390 Urinary tract infection, site not specified: Secondary | ICD-10-CM | POA: Diagnosis not present

## 2023-09-18 DIAGNOSIS — R41841 Cognitive communication deficit: Secondary | ICD-10-CM | POA: Diagnosis not present

## 2023-09-18 DIAGNOSIS — R1311 Dysphagia, oral phase: Secondary | ICD-10-CM | POA: Diagnosis not present

## 2023-09-19 DIAGNOSIS — R1311 Dysphagia, oral phase: Secondary | ICD-10-CM | POA: Diagnosis not present

## 2023-09-19 DIAGNOSIS — N390 Urinary tract infection, site not specified: Secondary | ICD-10-CM | POA: Diagnosis not present

## 2023-09-19 DIAGNOSIS — R41841 Cognitive communication deficit: Secondary | ICD-10-CM | POA: Diagnosis not present

## 2023-09-22 DIAGNOSIS — N390 Urinary tract infection, site not specified: Secondary | ICD-10-CM | POA: Diagnosis not present

## 2023-09-22 DIAGNOSIS — F03B11 Unspecified dementia, moderate, with agitation: Secondary | ICD-10-CM | POA: Diagnosis not present

## 2023-09-22 DIAGNOSIS — F32A Depression, unspecified: Secondary | ICD-10-CM | POA: Diagnosis not present

## 2023-09-22 DIAGNOSIS — R41841 Cognitive communication deficit: Secondary | ICD-10-CM | POA: Diagnosis not present

## 2023-09-22 DIAGNOSIS — R1311 Dysphagia, oral phase: Secondary | ICD-10-CM | POA: Diagnosis not present

## 2023-09-23 DIAGNOSIS — R1311 Dysphagia, oral phase: Secondary | ICD-10-CM | POA: Diagnosis not present

## 2023-09-23 DIAGNOSIS — N390 Urinary tract infection, site not specified: Secondary | ICD-10-CM | POA: Diagnosis not present

## 2023-09-23 DIAGNOSIS — R41841 Cognitive communication deficit: Secondary | ICD-10-CM | POA: Diagnosis not present

## 2023-09-24 DIAGNOSIS — R1311 Dysphagia, oral phase: Secondary | ICD-10-CM | POA: Diagnosis not present

## 2023-09-24 DIAGNOSIS — R41841 Cognitive communication deficit: Secondary | ICD-10-CM | POA: Diagnosis not present

## 2023-09-24 DIAGNOSIS — N390 Urinary tract infection, site not specified: Secondary | ICD-10-CM | POA: Diagnosis not present

## 2023-09-25 DIAGNOSIS — R41841 Cognitive communication deficit: Secondary | ICD-10-CM | POA: Diagnosis not present

## 2023-09-25 DIAGNOSIS — N390 Urinary tract infection, site not specified: Secondary | ICD-10-CM | POA: Diagnosis not present

## 2023-09-25 DIAGNOSIS — R1311 Dysphagia, oral phase: Secondary | ICD-10-CM | POA: Diagnosis not present

## 2023-09-26 DIAGNOSIS — N390 Urinary tract infection, site not specified: Secondary | ICD-10-CM | POA: Diagnosis not present

## 2023-09-26 DIAGNOSIS — R41841 Cognitive communication deficit: Secondary | ICD-10-CM | POA: Diagnosis not present

## 2023-09-26 DIAGNOSIS — R1311 Dysphagia, oral phase: Secondary | ICD-10-CM | POA: Diagnosis not present

## 2023-09-29 ENCOUNTER — Telehealth: Payer: Self-pay | Admitting: Pharmacist

## 2023-09-29 DIAGNOSIS — R1311 Dysphagia, oral phase: Secondary | ICD-10-CM | POA: Diagnosis not present

## 2023-09-29 DIAGNOSIS — R41841 Cognitive communication deficit: Secondary | ICD-10-CM | POA: Diagnosis not present

## 2023-09-29 DIAGNOSIS — N390 Urinary tract infection, site not specified: Secondary | ICD-10-CM | POA: Diagnosis not present

## 2023-09-29 NOTE — Progress Notes (Signed)
 Pharmacy Quality Measure Review  This patient is appearing on a report for being at risk of failing the adherence measure for hypertension (ACEi/ARB) medications this calendar year.   Medication: Losartan  Last fill date: 09/22/23 for 30 day supply  Insurance report was not up to date. No action needed at this time.   Darrelyn Drum, PharmD, BCPS, CPP Clinical Pharmacist Practitioner [facility] [phone]

## 2023-09-30 DIAGNOSIS — R41841 Cognitive communication deficit: Secondary | ICD-10-CM | POA: Diagnosis not present

## 2023-09-30 DIAGNOSIS — R1311 Dysphagia, oral phase: Secondary | ICD-10-CM | POA: Diagnosis not present

## 2023-09-30 DIAGNOSIS — N390 Urinary tract infection, site not specified: Secondary | ICD-10-CM | POA: Diagnosis not present

## 2023-10-01 DIAGNOSIS — I5022 Chronic systolic (congestive) heart failure: Secondary | ICD-10-CM | POA: Diagnosis not present

## 2023-10-01 DIAGNOSIS — F419 Anxiety disorder, unspecified: Secondary | ICD-10-CM | POA: Diagnosis not present

## 2023-10-01 DIAGNOSIS — Z7901 Long term (current) use of anticoagulants: Secondary | ICD-10-CM | POA: Diagnosis not present

## 2023-10-01 DIAGNOSIS — I4891 Unspecified atrial fibrillation: Secondary | ICD-10-CM | POA: Diagnosis not present

## 2023-10-01 DIAGNOSIS — N189 Chronic kidney disease, unspecified: Secondary | ICD-10-CM | POA: Diagnosis not present

## 2023-10-01 DIAGNOSIS — E039 Hypothyroidism, unspecified: Secondary | ICD-10-CM | POA: Diagnosis not present

## 2023-10-01 DIAGNOSIS — F02811 Dementia in other diseases classified elsewhere, unspecified severity, with agitation: Secondary | ICD-10-CM | POA: Diagnosis not present

## 2023-10-01 DIAGNOSIS — F0284 Dementia in other diseases classified elsewhere, unspecified severity, with anxiety: Secondary | ICD-10-CM | POA: Diagnosis not present

## 2023-10-16 DIAGNOSIS — N189 Chronic kidney disease, unspecified: Secondary | ICD-10-CM | POA: Diagnosis not present

## 2023-10-16 DIAGNOSIS — F0283 Dementia in other diseases classified elsewhere, unspecified severity, with mood disturbance: Secondary | ICD-10-CM | POA: Diagnosis not present

## 2023-10-16 DIAGNOSIS — I5022 Chronic systolic (congestive) heart failure: Secondary | ICD-10-CM | POA: Diagnosis not present

## 2023-10-16 DIAGNOSIS — F02811 Dementia in other diseases classified elsewhere, unspecified severity, with agitation: Secondary | ICD-10-CM | POA: Diagnosis not present

## 2023-10-16 DIAGNOSIS — Z7901 Long term (current) use of anticoagulants: Secondary | ICD-10-CM | POA: Diagnosis not present

## 2023-10-16 DIAGNOSIS — F324 Major depressive disorder, single episode, in partial remission: Secondary | ICD-10-CM | POA: Diagnosis not present

## 2023-10-16 DIAGNOSIS — I4891 Unspecified atrial fibrillation: Secondary | ICD-10-CM | POA: Diagnosis not present

## 2023-10-16 DIAGNOSIS — E876 Hypokalemia: Secondary | ICD-10-CM | POA: Diagnosis not present

## 2023-10-16 DIAGNOSIS — E039 Hypothyroidism, unspecified: Secondary | ICD-10-CM | POA: Diagnosis not present

## 2023-11-20 DIAGNOSIS — I130 Hypertensive heart and chronic kidney disease with heart failure and stage 1 through stage 4 chronic kidney disease, or unspecified chronic kidney disease: Secondary | ICD-10-CM | POA: Diagnosis not present

## 2023-11-20 DIAGNOSIS — E039 Hypothyroidism, unspecified: Secondary | ICD-10-CM | POA: Diagnosis not present

## 2023-11-20 DIAGNOSIS — F0284 Dementia in other diseases classified elsewhere, unspecified severity, with anxiety: Secondary | ICD-10-CM | POA: Diagnosis not present

## 2023-11-20 DIAGNOSIS — Z751 Person awaiting admission to adequate facility elsewhere: Secondary | ICD-10-CM | POA: Diagnosis not present

## 2023-11-20 DIAGNOSIS — I5022 Chronic systolic (congestive) heart failure: Secondary | ICD-10-CM | POA: Diagnosis not present

## 2023-11-20 DIAGNOSIS — Z5320 Procedure and treatment not carried out because of patient's decision for unspecified reasons: Secondary | ICD-10-CM | POA: Diagnosis not present

## 2023-11-20 DIAGNOSIS — N189 Chronic kidney disease, unspecified: Secondary | ICD-10-CM | POA: Diagnosis not present

## 2023-11-27 DIAGNOSIS — I4891 Unspecified atrial fibrillation: Secondary | ICD-10-CM | POA: Diagnosis not present

## 2023-11-27 DIAGNOSIS — Z7901 Long term (current) use of anticoagulants: Secondary | ICD-10-CM | POA: Diagnosis not present

## 2023-11-27 DIAGNOSIS — F329 Major depressive disorder, single episode, unspecified: Secondary | ICD-10-CM | POA: Diagnosis not present

## 2023-11-27 DIAGNOSIS — I5022 Chronic systolic (congestive) heart failure: Secondary | ICD-10-CM | POA: Diagnosis not present

## 2023-11-27 DIAGNOSIS — N3281 Overactive bladder: Secondary | ICD-10-CM | POA: Diagnosis not present

## 2023-11-27 DIAGNOSIS — F02811 Dementia in other diseases classified elsewhere, unspecified severity, with agitation: Secondary | ICD-10-CM | POA: Diagnosis not present

## 2023-11-27 DIAGNOSIS — I110 Hypertensive heart disease with heart failure: Secondary | ICD-10-CM | POA: Diagnosis not present

## 2023-11-27 DIAGNOSIS — E039 Hypothyroidism, unspecified: Secondary | ICD-10-CM | POA: Diagnosis not present

## 2023-11-27 DIAGNOSIS — F0284 Dementia in other diseases classified elsewhere, unspecified severity, with anxiety: Secondary | ICD-10-CM | POA: Diagnosis not present

## 2023-11-28 DIAGNOSIS — I110 Hypertensive heart disease with heart failure: Secondary | ICD-10-CM | POA: Diagnosis not present

## 2023-11-28 DIAGNOSIS — F03918 Unspecified dementia, unspecified severity, with other behavioral disturbance: Secondary | ICD-10-CM | POA: Diagnosis not present

## 2023-11-28 DIAGNOSIS — I5022 Chronic systolic (congestive) heart failure: Secondary | ICD-10-CM | POA: Diagnosis not present

## 2023-11-28 DIAGNOSIS — Z713 Dietary counseling and surveillance: Secondary | ICD-10-CM | POA: Diagnosis not present

## 2023-12-04 DIAGNOSIS — R41841 Cognitive communication deficit: Secondary | ICD-10-CM | POA: Diagnosis not present

## 2023-12-04 DIAGNOSIS — R1311 Dysphagia, oral phase: Secondary | ICD-10-CM | POA: Diagnosis not present

## 2023-12-04 DIAGNOSIS — N390 Urinary tract infection, site not specified: Secondary | ICD-10-CM | POA: Diagnosis not present

## 2023-12-05 DIAGNOSIS — R1311 Dysphagia, oral phase: Secondary | ICD-10-CM | POA: Diagnosis not present

## 2023-12-05 DIAGNOSIS — I5022 Chronic systolic (congestive) heart failure: Secondary | ICD-10-CM | POA: Diagnosis not present

## 2023-12-05 DIAGNOSIS — Z7901 Long term (current) use of anticoagulants: Secondary | ICD-10-CM | POA: Diagnosis not present

## 2023-12-05 DIAGNOSIS — Z7189 Other specified counseling: Secondary | ICD-10-CM | POA: Diagnosis not present

## 2023-12-05 DIAGNOSIS — N390 Urinary tract infection, site not specified: Secondary | ICD-10-CM | POA: Diagnosis not present

## 2023-12-05 DIAGNOSIS — I4891 Unspecified atrial fibrillation: Secondary | ICD-10-CM | POA: Diagnosis not present

## 2023-12-05 DIAGNOSIS — Z0000 Encounter for general adult medical examination without abnormal findings: Secondary | ICD-10-CM | POA: Diagnosis not present

## 2023-12-05 DIAGNOSIS — F329 Major depressive disorder, single episode, unspecified: Secondary | ICD-10-CM | POA: Diagnosis not present

## 2023-12-05 DIAGNOSIS — Z5320 Procedure and treatment not carried out because of patient's decision for unspecified reasons: Secondary | ICD-10-CM | POA: Diagnosis not present

## 2023-12-05 DIAGNOSIS — H409 Unspecified glaucoma: Secondary | ICD-10-CM | POA: Diagnosis not present

## 2023-12-05 DIAGNOSIS — R41841 Cognitive communication deficit: Secondary | ICD-10-CM | POA: Diagnosis not present

## 2023-12-05 DIAGNOSIS — Z66 Do not resuscitate: Secondary | ICD-10-CM | POA: Diagnosis not present

## 2023-12-08 DIAGNOSIS — R1311 Dysphagia, oral phase: Secondary | ICD-10-CM | POA: Diagnosis not present

## 2023-12-08 DIAGNOSIS — N390 Urinary tract infection, site not specified: Secondary | ICD-10-CM | POA: Diagnosis not present

## 2023-12-08 DIAGNOSIS — R41841 Cognitive communication deficit: Secondary | ICD-10-CM | POA: Diagnosis not present

## 2023-12-09 DIAGNOSIS — R41841 Cognitive communication deficit: Secondary | ICD-10-CM | POA: Diagnosis not present

## 2023-12-09 DIAGNOSIS — N390 Urinary tract infection, site not specified: Secondary | ICD-10-CM | POA: Diagnosis not present

## 2023-12-09 DIAGNOSIS — R1311 Dysphagia, oral phase: Secondary | ICD-10-CM | POA: Diagnosis not present

## 2023-12-11 DIAGNOSIS — R1311 Dysphagia, oral phase: Secondary | ICD-10-CM | POA: Diagnosis not present

## 2023-12-11 DIAGNOSIS — N390 Urinary tract infection, site not specified: Secondary | ICD-10-CM | POA: Diagnosis not present

## 2023-12-11 DIAGNOSIS — R41841 Cognitive communication deficit: Secondary | ICD-10-CM | POA: Diagnosis not present

## 2023-12-12 DIAGNOSIS — N390 Urinary tract infection, site not specified: Secondary | ICD-10-CM | POA: Diagnosis not present

## 2023-12-12 DIAGNOSIS — L603 Nail dystrophy: Secondary | ICD-10-CM | POA: Diagnosis not present

## 2023-12-12 DIAGNOSIS — R1311 Dysphagia, oral phase: Secondary | ICD-10-CM | POA: Diagnosis not present

## 2023-12-12 DIAGNOSIS — L602 Onychogryphosis: Secondary | ICD-10-CM | POA: Diagnosis not present

## 2023-12-12 DIAGNOSIS — R41841 Cognitive communication deficit: Secondary | ICD-10-CM | POA: Diagnosis not present

## 2023-12-12 DIAGNOSIS — I739 Peripheral vascular disease, unspecified: Secondary | ICD-10-CM | POA: Diagnosis not present

## 2023-12-16 DIAGNOSIS — R1311 Dysphagia, oral phase: Secondary | ICD-10-CM | POA: Diagnosis not present

## 2023-12-16 DIAGNOSIS — R41841 Cognitive communication deficit: Secondary | ICD-10-CM | POA: Diagnosis not present

## 2023-12-16 DIAGNOSIS — N390 Urinary tract infection, site not specified: Secondary | ICD-10-CM | POA: Diagnosis not present

## 2023-12-17 DIAGNOSIS — N390 Urinary tract infection, site not specified: Secondary | ICD-10-CM | POA: Diagnosis not present

## 2023-12-18 ENCOUNTER — Emergency Department (HOSPITAL_COMMUNITY)

## 2023-12-18 ENCOUNTER — Emergency Department (HOSPITAL_COMMUNITY)
Admission: EM | Admit: 2023-12-18 | Discharge: 2023-12-19 | Disposition: A | Discharge status: Skilled Nursing Facility | Source: Skilled Nursing Facility | Attending: Emergency Medicine | Admitting: Emergency Medicine

## 2023-12-18 DIAGNOSIS — R918 Other nonspecific abnormal finding of lung field: Secondary | ICD-10-CM | POA: Diagnosis not present

## 2023-12-18 DIAGNOSIS — N183 Chronic kidney disease, stage 3 unspecified: Secondary | ICD-10-CM | POA: Insufficient documentation

## 2023-12-18 DIAGNOSIS — I2510 Atherosclerotic heart disease of native coronary artery without angina pectoris: Secondary | ICD-10-CM | POA: Diagnosis not present

## 2023-12-18 DIAGNOSIS — W07XXXA Fall from chair, initial encounter: Secondary | ICD-10-CM | POA: Diagnosis not present

## 2023-12-18 DIAGNOSIS — Z87891 Personal history of nicotine dependence: Secondary | ICD-10-CM | POA: Diagnosis not present

## 2023-12-18 DIAGNOSIS — W19XXXA Unspecified fall, initial encounter: Secondary | ICD-10-CM

## 2023-12-18 DIAGNOSIS — I4811 Longstanding persistent atrial fibrillation: Secondary | ICD-10-CM | POA: Diagnosis not present

## 2023-12-18 DIAGNOSIS — I517 Cardiomegaly: Secondary | ICD-10-CM | POA: Diagnosis not present

## 2023-12-18 DIAGNOSIS — Z79899 Other long term (current) drug therapy: Secondary | ICD-10-CM | POA: Insufficient documentation

## 2023-12-18 DIAGNOSIS — Z8673 Personal history of transient ischemic attack (TIA), and cerebral infarction without residual deficits: Secondary | ICD-10-CM | POA: Insufficient documentation

## 2023-12-18 DIAGNOSIS — F0280 Dementia in other diseases classified elsewhere without behavioral disturbance: Secondary | ICD-10-CM | POA: Diagnosis not present

## 2023-12-18 DIAGNOSIS — Z7982 Long term (current) use of aspirin: Secondary | ICD-10-CM | POA: Insufficient documentation

## 2023-12-18 DIAGNOSIS — G9389 Other specified disorders of brain: Secondary | ICD-10-CM | POA: Diagnosis not present

## 2023-12-18 DIAGNOSIS — Z7901 Long term (current) use of anticoagulants: Secondary | ICD-10-CM | POA: Diagnosis not present

## 2023-12-18 DIAGNOSIS — S0083XA Contusion of other part of head, initial encounter: Secondary | ICD-10-CM | POA: Insufficient documentation

## 2023-12-18 DIAGNOSIS — I130 Hypertensive heart and chronic kidney disease with heart failure and stage 1 through stage 4 chronic kidney disease, or unspecified chronic kidney disease: Secondary | ICD-10-CM | POA: Insufficient documentation

## 2023-12-18 DIAGNOSIS — I5022 Chronic systolic (congestive) heart failure: Secondary | ICD-10-CM | POA: Insufficient documentation

## 2023-12-18 DIAGNOSIS — Z043 Encounter for examination and observation following other accident: Secondary | ICD-10-CM | POA: Diagnosis not present

## 2023-12-18 DIAGNOSIS — E039 Hypothyroidism, unspecified: Secondary | ICD-10-CM | POA: Insufficient documentation

## 2023-12-18 DIAGNOSIS — R41841 Cognitive communication deficit: Secondary | ICD-10-CM | POA: Diagnosis not present

## 2023-12-18 DIAGNOSIS — S199XXA Unspecified injury of neck, initial encounter: Secondary | ICD-10-CM | POA: Diagnosis not present

## 2023-12-18 DIAGNOSIS — I6523 Occlusion and stenosis of bilateral carotid arteries: Secondary | ICD-10-CM | POA: Diagnosis not present

## 2023-12-18 DIAGNOSIS — S0990XA Unspecified injury of head, initial encounter: Secondary | ICD-10-CM | POA: Diagnosis not present

## 2023-12-18 NOTE — Discharge Instructions (Signed)
 We evaluated Yvonne Bailey for her fall.  Her scans and x-rays were negative for any injury.  We feel that is safe for her to go home.  Please bring her back if she develops any new symptoms.

## 2023-12-18 NOTE — ED Triage Notes (Signed)
 Pt found down at living facility in the dining room. Unwitnessed fall from chair bruise on left side of forehead. Baseline dementia. Ccollar in place.

## 2023-12-18 NOTE — Progress Notes (Signed)
 Orthopedic Tech Progress Note Patient Details:  Yvonne Bailey 1940/09/14 [id number]  Patient ID: Yvonne Bailey, female   DOB: 06/22/1940, 83 y.o.   MRN: [id number] Responded to level 2 trauma ortho techs currently not needed [name] 12/18/2023, 8:44 PM

## 2023-12-18 NOTE — ED Notes (Signed)
 Trauma Response Nurse Documentation   Yvonne Bailey is a 83 y.o. female arriving to [facility] ED via EMS  On Eliquis  (apixaban ) daily. Trauma was activated as a Level 2 by ED charge RN based on the following trauma criteria Elderly patients > 65 with head trauma on anti-coagulation (excluding ASA).  Patient cleared for CT by Dr. Francesca EDP. Pt transported to CT with trauma response nurse present to monitor. RN remained with the patient throughout their absence from the department for clinical observation.   GCS 14.   History   Past Medical History:  Diagnosis Date   Bilateral leg edema 07/19/2015   CAD (coronary artery disease)    a. anterior MI s/p PCI in 1992. b. cath 08/2015 with severe three-vessel CAD turned down for CABG and underwent DESx5 to prox Cx/OM2/D1/oRCA/mRCA   Carotid artery disease    a. carotid duplex 03/2015 showed 1-39% BICA, normal subclavian arteries, chronically occluded left vertebral, f/u recommended only PRN.   DVT (deep venous thrombosis) (HCC)    X1   History of nuclear stress test    a. Myoview  6/17: EF 20-25%, mid anteroseptal, apical anterior, apical septal, apical inferior, apical lateral and apical scar, no ischemia, intermediate risk   HTN (hypertension)    Hyperlipidemia    Hypokalemia    Hypothyroidism    Ischemic cardiomyopathy    a. Echo 6/17: EF 20-25%, apex appears akinetic, MAC, moderate MR, moderate LAE, mild RVE, trivial PI, PASP 47 mmHg (needs repeat with Definity  contrast).  b. Limited echo with Definity  contrast 7/17: EF 25-30%, moderate to severe LAE. c. Limited Echo 2018 showed EF 40-45%.   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 02/2017 in setting of noncompliance with Eliquis    TIA (transient ischemic attack)    Tricuspid regurgitation      Past Surgical History:  Procedure Laterality Date   APPENDECTOMY     at hysterectomy and USO for fibroids, Dr. Vinita   CARDIAC CATHETERIZATION  1992   Dr Wolm Nida   CARDIAC CATHETERIZATION N/A 09/06/2015   Procedure: Right/Left Heart Cath and Coronary Angiography;  Surgeon: Lonni JONETTA Cash, MD;  Location[facility] INVASIVE CV LAB;  Service: Cardiovascular;  Laterality: N/A;   CARDIAC CATHETERIZATION N/A 09/07/2015   Procedure: Coronary Stent Intervention;  Surgeon: Lonni JONETTA Cash, MD;  Location: [facility] INVASIVE CV LAB;  Service: Cardiovascular;  Laterality: N/A;   COLONOSCOPY     negative; 2008, Dr. Princella Nida   fracture LLE     '94; pinned   IR ANGIO INTRA EXTRACRAN SEL COM CAROTID INNOMINATE BILAT MOD SED  03/02/2017   IR ANGIO INTRA EXTRACRAN SEL COM CAROTID INNOMINATE BILAT MOD SED  04/23/2018   IR ANGIO VERTEBRAL SEL SUBCLAVIAN INNOMINATE BILAT MOD SED  04/23/2018   IR ANGIO VERTEBRAL SEL VERTEBRAL UNI R MOD SED  03/02/2017   IR RADIOLOGIST EVAL & MGMT  04/28/2017   IR US  GUIDE VASC ACCESS RIGHT  04/23/2018   RADIOLOGY WITH ANESTHESIA N/A 03/02/2017   Procedure: RADIOLOGY WITH ANESTHESIA;  Surgeon: Dolphus Carrion, MD;  Location: [facility] OR;  Service: Radiology;  Laterality: N/A;   TEE WITHOUT CARDIOVERSION N/A 11/02/2020   Procedure: TRANSESOPHAGEAL ECHOCARDIOGRAM (TEE);  Surgeon: Francyne Headland, MD;  Location: [facility] ENDOSCOPY;  Service: Cardiovascular;  Laterality: N/A;   TONSILLECTOMY     TOTAL ABDOMINAL HYSTERECTOMY     & BSO for fibroids       Initial Focused Assessment (If applicable, or please see trauma documentation): Confused pt presents via EMS from SNF after unwitnessed fall from wheelchair. Hematoma to left forehead, on eliquis . Per EMS alert to baseline. No other trauma noted, hx stroke with weakness on R  side.  Airway patent, BS clear No obvious uncontrolled hemorrhage GCS 14   CT's Completed:   CT Head and CT C-Spine   Interventions:  Portable chest and pelvis XRAY CT head and c-spine IV/labs deferred by EDP  Plan for disposition:  Discharge home   Consults completed:  none  Event Summary: Presents via EMS from SNF after fall from wheelchair. Hematoma to left forehead, no other trauma noted. Pt confused to baseline, denies other complaints, states she's ready to go home. Trauma scans unremarkable, anticipate D/C back to facility.    Bedside handoff with ED RN Jabier.    Ashana Tullo O Keturah Yerby  Trauma Response RN  Please call TRN at [phone] for further assistance.

## 2023-12-18 NOTE — ED Provider Notes (Signed)
 [place] EMERGENCY DEPARTMENT AT [facility] Provider Note  CSN: [id number] Arrival date & time: 12/18/23 2019  Chief Complaint(s) Fall (Unwitnessed Fall on eliquis . Hematoma to the left forehead. Per staff at baseline./)  HPI Yvonne Bailey is a 83 y.o. female history of dementia, prior stroke, coronary artery disease, prior DVT on Eliquis , who range Coumadin for lipidemia, atrial fibrillation presenting to the emergency department with fall.  Patient chronically wheelchair-bound.  She was eating dinner, apparently staff at facility found her on the ground in dining area.  No witnessed fall.  Patient denies any complaints.  She reports that nothing hurts.  She is not able to tell me why she fell.  She notes that she did fall.  History is limited due to dementia   Past Medical History Past Medical History:  Diagnosis Date   Bilateral leg edema 07/19/2015   CAD (coronary artery disease)    a. anterior MI s/p PCI in 1992. b. cath 08/2015 with severe three-vessel CAD turned down for CABG and underwent DESx5 to prox Cx/OM2/D1/oRCA/mRCA   Carotid artery disease    a. carotid duplex 03/2015 showed 1-39% BICA, normal subclavian arteries, chronically occluded left vertebral, f/u recommended only PRN.   DVT (deep venous thrombosis) (HCC)    X1   History of nuclear stress test    a. Myoview  6/17: EF 20-25%, mid anteroseptal, apical anterior, apical septal, apical inferior, apical lateral and apical scar, no ischemia, intermediate risk   HTN (hypertension)    Hyperlipidemia    Hypokalemia    Hypothyroidism    Ischemic cardiomyopathy    a. Echo 6/17: EF 20-25%, apex appears akinetic, MAC, moderate MR, moderate LAE, mild RVE, trivial PI, PASP 47 mmHg (needs repeat with Definity  contrast).  b. Limited echo with Definity  contrast 7/17: EF 25-30%, moderate to severe LAE. c. Limited Echo 2018 showed EF 40-45%.   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   Dr Onita, Neurology    Leukocytosis     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   Longstanding persistent atrial fibrillation (HCC)    MI (myocardial infarction) (HCC) 1992   PAD (peripheral artery disease)    Right SFA occlusion, severe disease left CFA and SFA   Prediabetes 07/28/2016   Stage 3 chronic kidney disease (HCC)    Stroke (HCC)    a. 02/2017 in setting of noncompliance with Eliquis    TIA (transient ischemic attack)    Tricuspid regurgitation    Patient Active Problem List   Diagnosis Date Noted   Physical deconditioning 01/31/2023   Poor balance 01/31/2023   Recurrent episodes of unresponsiveness 07/02/2022   TIA (transient ischemic attack) 06/29/2022   Moderate dementia (HCC) 04/30/2022   Fall 03/17/2022   Hypomagnesemia 11/19/2021   Hypokalemia 11/17/2021   Rash 08/29/2020   Anxiety and depression 03/28/2020   Overactive bladder 03/28/2020   Lack of motivation 12/21/2019   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   Aphasia as late effect of cerebrovascular accident (CVA)    Acute ischemic cerebrovascular accident (CVA) involving left middle cerebral artery territory [facility])    Coronary artery disease involving native coronary artery without angina pectoris    Stage 3 chronic kidney disease (HCC)    Prediabetes 07/28/2016   DOE (dyspnea on exertion) 07/09/2016   Gait instability 07/09/2016   Cardiomyopathy, ischemic    Chronic systolic heart failure (HCC) 08/21/2015   Bilateral leg edema 07/19/2015   Atrial fibrillation (HCC) 07/19/2015   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   Small vessel disease, cerebrovascular 12/17/2012  CAROTID BRUIT, RIGHT 08/10/2008   Peripheral vascular disease (HCC) 06/04/2007   Diverticulosis of large intestine 06/04/2007   Hypothyroidism 02/24/2007   Dyslipidemia 02/24/2007   Essential hypertension 02/24/2007   Acute thromboembolism of deep veins of lower extremity (HCC) 01/21/2007   Home Medication(s) Prior to Admission medications   Medication Sig Start Date End Date  Taking? Authorizing Provider  aspirin  EC 81 MG tablet Take 81 mg by mouth daily. Swallow whole.    [provider]  atorvastatin  (LIPITOR ) 40 MG tablet TAKE 2 TABLETS(80 MG) BY MOUTH DAILY Patient taking differently: Take 80 mg by mouth daily. 02/11/23   Geofm Glade PARAS, MD  dorzolamide -timolol  (COSOPT ) 2-0.5 % ophthalmic solution Place 1 drop into both eyes in the morning, at noon, and at bedtime.    [provider]  ELIQUIS  5 MG TABS tablet TAKE 1 TABLET(5 MG) BY MOUTH TWICE DAILY Patient taking differently: Take 5 mg by mouth 2 (two) times daily. 02/18/23   Geofm Glade PARAS, MD  furosemide  (LASIX ) 20 MG tablet Take 1 tablet (20 mg total) by mouth daily. Patient taking differently: Take 20 mg by mouth daily as needed for edema. 11/28/22   Cheryle Page, MD  levothyroxine  (SYNTHROID ) 100 MCG tablet TAKE 1 TABLET(100 MCG) BY MOUTH DAILY 04/10/23   Burns, Glade PARAS, MD  losartan  (COZAAR ) 25 MG tablet Take 1 tablet (25 mg total) by mouth daily. 01/31/23   Geofm Glade PARAS, MD  metoprolol  tartrate (LOPRESSOR ) 25 MG tablet Take 0.5 tablets (12.5 mg total) by mouth 2 (two) times daily. 01/31/23   Geofm Glade PARAS, MD  MYRBETRIQ  25 MG TB24 tablet TAKE 1 TABLET(25 MG) BY MOUTH DAILY Patient taking differently: Take 25 mg by mouth daily. 04/30/22   Geofm Glade PARAS, MD  Netarsudil  Dimesylate (RHOPRESSA) 0.02 % SOLN Place 1 drop into both eyes every evening.    [provider]  nitroGLYCERIN  (NITROSTAT ) 0.4 MG SL tablet Place 1 tablet (0.4 mg total) under the tongue every 5 (five) minutes as needed for chest pain. Patient not taking: Reported on 02/22/2023 09/08/15   Smith, Erin E, NP  polyethylene glycol (MIRALAX ) 17 g packet Take 17 g by mouth daily as needed for mild constipation. 11/27/22   Cheryle Page, MD  senna-docusate (SENOKOT-S) 8.6-50 MG tablet Take 2 tablets by mouth 2 (two) times daily. 11/27/22   Cheryle Page, MD  sertraline  (ZOLOFT ) 100 MG tablet Take 1 tablet (100 mg total) by  mouth daily. 01/31/23   Geofm Glade PARAS, MD                                                                                                                                    Past Surgical History Past Surgical History:  Procedure Laterality Date   APPENDECTOMY     at hysterectomy and USO for fibroids, Dr. Vinita   CARDIAC CATHETERIZATION  1992   Dr Wolm Nida   CARDIAC  CATHETERIZATION N/A 09/06/2015   Procedure: Right/Left Heart Cath and Coronary Angiography;  Surgeon: Lonni JONETTA Cash, MD;  Location: [facility] INVASIVE CV LAB;  Service: Cardiovascular;  Laterality: N/A;   CARDIAC CATHETERIZATION N/A 09/07/2015   Procedure: Coronary Stent Intervention;  Surgeon: Lonni JONETTA Cash, MD;  Location: [facility] INVASIVE CV LAB;  Service: Cardiovascular;  Laterality: N/A;   COLONOSCOPY     negative; 2008, Dr. Princella Nida   fracture LLE     '94; pinned   IR ANGIO INTRA EXTRACRAN SEL COM CAROTID INNOMINATE BILAT MOD SED  03/02/2017   IR ANGIO INTRA EXTRACRAN SEL COM CAROTID INNOMINATE BILAT MOD SED  04/23/2018   IR ANGIO VERTEBRAL SEL SUBCLAVIAN INNOMINATE BILAT MOD SED  04/23/2018   IR ANGIO VERTEBRAL SEL VERTEBRAL UNI R MOD SED  03/02/2017   IR RADIOLOGIST EVAL & MGMT  04/28/2017   IR US  GUIDE VASC ACCESS RIGHT  04/23/2018   RADIOLOGY WITH ANESTHESIA N/A 03/02/2017   Procedure: RADIOLOGY WITH ANESTHESIA;  Surgeon: Dolphus Carrion, MD;  Location: [facility] OR;  Service: Radiology;  Laterality: N/A;   TEE WITHOUT CARDIOVERSION N/A 11/02/2020   Procedure: TRANSESOPHAGEAL ECHOCARDIOGRAM (TEE);  Surgeon: Francyne Headland, MD;  Location: [facility] ENDOSCOPY;  Service: Cardiovascular;  Laterality: N/A;   TONSILLECTOMY     TOTAL ABDOMINAL HYSTERECTOMY     & BSO for fibroids   Family History Family History  Problem Relation Age of Onset   Diabetes Mother    Hypertension Mother    Stroke Brother        ?> 14   Coronary artery disease Brother        stent in 38s   Cancer Neg Hx     Social History Social  History   Tobacco Use   Smoking status: Former    Current packs/day: 0.00    Types: Cigarettes    Quit date: 03/11/1990    Years since quitting: 33.7   Smokeless tobacco: Never   Tobacco comments:    smoked 1973- 1992, up to < 5 cigarettes  Vaping Use   Vaping status: Never Used  Substance Use Topics   Alcohol use: No   Drug use: No   Allergies Definity  [perflutren  lipid microsphere] and Pletal [cilostazol]  Review of Systems Review of Systems  All other systems reviewed and are negative.   Physical Exam Vital Signs  I have reviewed the triage vital signs BP (!) 168/74   Pulse (!) 57   Temp 97.8 F (36.6 C)   Resp 13   Ht 5' 7 (1.702 m)   Wt 81.6 kg   SpO2 100%   BMI 28.19 kg/m  Physical Exam Vitals and nursing note reviewed.  Constitutional:      General: She is not in acute distress.    Appearance: She is well-developed.  HENT:     Head: Normocephalic.     Comments: Mild swelling to anterior forehead     Mouth/Throat:     Mouth: Mucous membranes are moist.  Eyes:     Pupils: Pupils are equal, round, and reactive to light.  Cardiovascular:     Rate and Rhythm: Normal rate and regular rhythm.     Heart sounds: No murmur heard. Pulmonary:     Effort: Pulmonary effort is normal. No respiratory distress.     Breath sounds: Normal breath sounds.  Abdominal:     General: Abdomen is flat.     Palpations: Abdomen is soft.     Tenderness: There is no abdominal tenderness.  Musculoskeletal:        General: No tenderness.     Right lower leg: No edema.     Left lower leg: No edema.     Comments: No midline C, T, L-spine tenderness.  No chest wall tenderness or crepitus.  Full painless range of motion at the bilateral upper extremities including the shoulders, elbows, wrists, hand and fingers, and in the bilateral lower extremities including the hips, knees, ankle, toes.  No focal bony tenderness, injury or deformity.  Skin:    General: Skin is warm and dry.   Neurological:     General: No focal deficit present.     Mental Status: She is alert. Mental status is at baseline.     Comments: Oriented to self, situation, not to year  Psychiatric:        Mood and Affect: Mood normal.        Behavior: Behavior normal.     ED Results and Treatments Labs (all labs ordered are listed, but only abnormal results are displayed) Labs Reviewed - No data to display                                                                                                                        Radiology DG Pelvis Portable Result Date: 12/18/2023 CLINICAL DATA:  fall EXAM: PORTABLE PELVIS 1-2 VIEWS COMPARISON:  X-ray pelvis 02/21/2023 FINDINGS: Patient is rotated. Sacrum poorly visualized-limited evaluation due to overlapping osseous structures and overlying soft tissues. There is no evidence of pelvic fracture or diastasis. No acute displaced fracture or dislocation of either hips on frontal view. No pelvic bone lesions are seen. IMPRESSION: Negative for acute traumatic injury. Slightly limited evaluation due to patient rotation. Limited evaluation due to overlapping osseous structures and overlying soft tissues. Electronically Signed   By: Morgane  Naveau M.D.   On: 12/18/2023 20:57   CT Head Wo Contrast Result Date: 12/18/2023 CLINICAL DATA:  Head trauma, minor (Age >= 65y); Neck trauma (Age >= 65y) Fall EXAM: CT HEAD WITHOUT CONTRAST CT CERVICAL SPINE WITHOUT CONTRAST TECHNIQUE: Multidetector CT imaging of the head and cervical spine was performed following the standard protocol without intravenous contrast. Multiplanar CT image reconstructions of the cervical spine were also generated. RADIATION DOSE REDUCTION: This exam was performed according to the departmental dose-optimization program which includes automated exposure control, adjustment of the mA and/or kV according to patient size and/or use of iterative reconstruction technique. COMPARISON:  CT head and C-spine  02/21/2023 FINDINGS: CT HEAD FINDINGS Brain: Cerebral ventricle sizes are concordant with the degree of cerebral volume loss. Patchy and confluent areas of decreased attenuation are noted throughout the deep and periventricular white matter of the cerebral hemispheres bilaterally, compatible with chronic microvascular ischemic disease. Right parietal encephalomalacia. No evidence of large-territorial acute infarction. No parenchymal hemorrhage. No mass lesion. No extra-axial collection. No mass effect or midline shift. No hydrocephalus. Basilar cisterns are patent. Vascular: No hyperdense vessel. Atherosclerotic calcifications are present within the cavernous internal  carotid arteries. Skull: No acute fracture or focal lesion. Sinuses/Orbits: Paranasal sinuses and mastoid air cells are clear. The orbits are unremarkable. Other: None. CT CERVICAL SPINE FINDINGS Alignment: Normal. Skull base and vertebrae: Multilevel mild-to-moderate degenerative changes of the spine. No associated severe osseous neural foraminal or central canal stenosis. No acute fracture. No aggressive appearing focal osseous lesion or focal pathologic process. Soft tissues and spinal canal: No prevertebral fluid or swelling. No visible canal hematoma. Upper chest: Biapical pleural/pulmonary scarring. Other: Atherosclerotic plaque of the carotid arteries within the neck. IMPRESSION: 1. No acute intracranial abnormality. 2. No acute displaced fracture or traumatic listhesis of the cervical spine. Electronically Signed   By: Morgane  Naveau M.D.   On: 12/18/2023 20:55   CT Cervical Spine Wo Contrast Result Date: 12/18/2023 CLINICAL DATA:  Head trauma, minor (Age >= 65y); Neck trauma (Age >= 65y) Fall EXAM: CT HEAD WITHOUT CONTRAST CT CERVICAL SPINE WITHOUT CONTRAST TECHNIQUE: Multidetector CT imaging of the head and cervical spine was performed following the standard protocol without intravenous contrast. Multiplanar CT image reconstructions of  the cervical spine were also generated. RADIATION DOSE REDUCTION: This exam was performed according to the departmental dose-optimization program which includes automated exposure control, adjustment of the mA and/or kV according to patient size and/or use of iterative reconstruction technique. COMPARISON:  CT head and C-spine 02/21/2023 FINDINGS: CT HEAD FINDINGS Brain: Cerebral ventricle sizes are concordant with the degree of cerebral volume loss. Patchy and confluent areas of decreased attenuation are noted throughout the deep and periventricular white matter of the cerebral hemispheres bilaterally, compatible with chronic microvascular ischemic disease. Right parietal encephalomalacia. No evidence of large-territorial acute infarction. No parenchymal hemorrhage. No mass lesion. No extra-axial collection. No mass effect or midline shift. No hydrocephalus. Basilar cisterns are patent. Vascular: No hyperdense vessel. Atherosclerotic calcifications are present within the cavernous internal carotid arteries. Skull: No acute fracture or focal lesion. Sinuses/Orbits: Paranasal sinuses and mastoid air cells are clear. The orbits are unremarkable. Other: None. CT CERVICAL SPINE FINDINGS Alignment: Normal. Skull base and vertebrae: Multilevel mild-to-moderate degenerative changes of the spine. No associated severe osseous neural foraminal or central canal stenosis. No acute fracture. No aggressive appearing focal osseous lesion or focal pathologic process. Soft tissues and spinal canal: No prevertebral fluid or swelling. No visible canal hematoma. Upper chest: Biapical pleural/pulmonary scarring. Other: Atherosclerotic plaque of the carotid arteries within the neck. IMPRESSION: 1. No acute intracranial abnormality. 2. No acute displaced fracture or traumatic listhesis of the cervical spine. Electronically Signed   By: Morgane  Naveau M.D.   On: 12/18/2023 20:55   DG Chest Portable 1 View Result Date:  12/18/2023 CLINICAL DATA:  fall EXAM: PORTABLE CHEST 1 VIEW COMPARISON:  Chest x-ray 02/21/2023 FINDINGS: Multiple lines overlie the chest. Enlarged cardiac silhouette. The heart and mediastinal contours are unchanged. No focal consolidation. Chronic coarsened interstitial markings with possible mild pulmonary edema. Blunting of bilateral costophrenic angles with trace bilateral pleural effusions not excluded. No pneumothorax. No acute osseous abnormality. IMPRESSION: Blunting of bilateral costophrenic angles with trace bilateral pleural effusions not excluded. Cardiomegaly with possible mild pulmonary edema. Electronically Signed   By: Morgane  Naveau M.D.   On: 12/18/2023 20:50    Pertinent labs & imaging results that were available during my care of the patient were reviewed by me and considered in my medical decision making (see MDM for details).  Medications Ordered in ED Medications - No data to display  Procedures Procedures  (including critical care time)  Medical Decision Making / ED Course   MDM:  83 year old presenting to the emergency department after fall from wheelchair.  Patient overall well-appearing, physical examination with mild forehead hematoma, otherwise no evidence of acute injury.  Obtained CT head, CT cervical spine without evidence of acute injury.  Also obtain chest x-ray pelvis x-ray without evidence of acute fracture.  Discussed with patient's son.  Feel patient is stable for discharge back to her facility.  Cannot ambulate as she is nonambulatory at baseline. Will discharge patient.       Additional history obtained: -Additional history obtained from family and ems -External records from outside source obtained and reviewed including: Chart review including previous notes, labs, imaging, consultation notes including prior notes     Imaging Studies ordered: I ordered imaging studies including CT head, C-spine. XR pelvis/chest  On my interpretation imaging demonstrates no acute injury  I independently visualized and interpreted imaging. I agree with the radiologist interpretation   Medicines ordered and prescription drug management: No orders of the defined types were placed in this encounter.   -I have reviewed the patients home medicines and have made adjustments as needed  Co morbidities that complicate the patient evaluation  Past Medical History:  Diagnosis Date   Bilateral leg edema 07/19/2015   CAD (coronary artery disease)    a. anterior MI s/p PCI in 1992. b. cath 08/2015 with severe three-vessel CAD turned down for CABG and underwent DESx5 to prox Cx/OM2/D1/oRCA/mRCA   Carotid artery disease    a. carotid duplex 03/2015 showed 1-39% BICA, normal subclavian arteries, chronically occluded left vertebral, f/u recommended only PRN.   DVT (deep venous thrombosis) (HCC)    X1   History of nuclear stress test    a. Myoview  6/17: EF 20-25%, mid anteroseptal, apical anterior, apical septal, apical inferior, apical lateral and apical scar, no ischemia, intermediate risk   HTN (hypertension)    Hyperlipidemia    Hypokalemia    Hypothyroidism    Ischemic cardiomyopathy    a. Echo 6/17: EF 20-25%, apex appears akinetic, MAC, moderate MR, moderate LAE, mild RVE, trivial PI, PASP 47 mmHg (needs repeat with Definity  contrast).  b. Limited echo with Definity  contrast 7/17: EF 25-30%, moderate to severe LAE. c. Limited Echo 2018 showed EF 40-45%.   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   Dr Onita, Neurology    Leukocytosis    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   Longstanding persistent atrial fibrillation (HCC)    MI (myocardial infarction) (HCC) 1992   PAD (peripheral artery disease)    Right SFA occlusion, severe disease left CFA and SFA   Prediabetes 07/28/2016   Stage 3 chronic kidney disease (HCC)    Stroke  (HCC)    a. 02/2017 in setting of noncompliance with Eliquis    TIA (transient ischemic attack)    Tricuspid regurgitation       Dispostion: Disposition decision including need for hospitalization was considered, and patient discharged from emergency department.    Final Clinical Impression(s) / ED Diagnoses Final diagnoses:  Fall, initial encounter     This chart was dictated using voice recognition software.  Despite best efforts to proofread,  errors can occur which can change the documentation meaning.    Francesca Elsie CROME, MD 12/18/23 2300

## 2023-12-19 DIAGNOSIS — Z7401 Bed confinement status: Secondary | ICD-10-CM | POA: Diagnosis not present

## 2023-12-22 DIAGNOSIS — R41841 Cognitive communication deficit: Secondary | ICD-10-CM | POA: Diagnosis not present

## 2023-12-22 DIAGNOSIS — W050XXA Fall from non-moving wheelchair, initial encounter: Secondary | ICD-10-CM | POA: Diagnosis not present

## 2023-12-22 DIAGNOSIS — R1311 Dysphagia, oral phase: Secondary | ICD-10-CM | POA: Diagnosis not present

## 2023-12-22 DIAGNOSIS — N390 Urinary tract infection, site not specified: Secondary | ICD-10-CM | POA: Diagnosis not present

## 2023-12-22 DIAGNOSIS — S0093XA Contusion of unspecified part of head, initial encounter: Secondary | ICD-10-CM | POA: Diagnosis not present

## 2023-12-22 DIAGNOSIS — Z993 Dependence on wheelchair: Secondary | ICD-10-CM | POA: Diagnosis not present

## 2023-12-23 DIAGNOSIS — N390 Urinary tract infection, site not specified: Secondary | ICD-10-CM | POA: Diagnosis not present

## 2023-12-23 DIAGNOSIS — R1311 Dysphagia, oral phase: Secondary | ICD-10-CM | POA: Diagnosis not present

## 2023-12-23 DIAGNOSIS — R41841 Cognitive communication deficit: Secondary | ICD-10-CM | POA: Diagnosis not present

## 2023-12-24 DIAGNOSIS — N390 Urinary tract infection, site not specified: Secondary | ICD-10-CM | POA: Diagnosis not present

## 2023-12-24 DIAGNOSIS — R1311 Dysphagia, oral phase: Secondary | ICD-10-CM | POA: Diagnosis not present

## 2023-12-24 DIAGNOSIS — R41841 Cognitive communication deficit: Secondary | ICD-10-CM | POA: Diagnosis not present

## 2023-12-25 DIAGNOSIS — N390 Urinary tract infection, site not specified: Secondary | ICD-10-CM | POA: Diagnosis not present

## 2023-12-29 DIAGNOSIS — F03918 Unspecified dementia, unspecified severity, with other behavioral disturbance: Secondary | ICD-10-CM | POA: Diagnosis not present

## 2023-12-29 DIAGNOSIS — R41841 Cognitive communication deficit: Secondary | ICD-10-CM | POA: Diagnosis not present

## 2023-12-29 DIAGNOSIS — Z713 Dietary counseling and surveillance: Secondary | ICD-10-CM | POA: Diagnosis not present

## 2023-12-29 DIAGNOSIS — I5022 Chronic systolic (congestive) heart failure: Secondary | ICD-10-CM | POA: Diagnosis not present

## 2023-12-29 DIAGNOSIS — N390 Urinary tract infection, site not specified: Secondary | ICD-10-CM | POA: Diagnosis not present

## 2023-12-29 DIAGNOSIS — I110 Hypertensive heart disease with heart failure: Secondary | ICD-10-CM | POA: Diagnosis not present

## 2023-12-29 DIAGNOSIS — Z6823 Body mass index (BMI) 23.0-23.9, adult: Secondary | ICD-10-CM | POA: Diagnosis not present

## 2023-12-29 DIAGNOSIS — R1311 Dysphagia, oral phase: Secondary | ICD-10-CM | POA: Diagnosis not present

## 2023-12-30 DIAGNOSIS — R1311 Dysphagia, oral phase: Secondary | ICD-10-CM | POA: Diagnosis not present

## 2023-12-30 DIAGNOSIS — R41841 Cognitive communication deficit: Secondary | ICD-10-CM | POA: Diagnosis not present

## 2023-12-30 DIAGNOSIS — N390 Urinary tract infection, site not specified: Secondary | ICD-10-CM | POA: Diagnosis not present

## 2024-01-05 DIAGNOSIS — F338 Other recurrent depressive disorders: Secondary | ICD-10-CM | POA: Diagnosis not present

## 2024-01-05 DIAGNOSIS — F03B11 Unspecified dementia, moderate, with agitation: Secondary | ICD-10-CM | POA: Diagnosis not present

## 2024-01-20 DIAGNOSIS — I130 Hypertensive heart and chronic kidney disease with heart failure and stage 1 through stage 4 chronic kidney disease, or unspecified chronic kidney disease: Secondary | ICD-10-CM | POA: Diagnosis not present

## 2024-01-20 DIAGNOSIS — E039 Hypothyroidism, unspecified: Secondary | ICD-10-CM | POA: Diagnosis not present

## 2024-01-20 DIAGNOSIS — Z86718 Personal history of other venous thrombosis and embolism: Secondary | ICD-10-CM | POA: Diagnosis not present

## 2024-01-20 DIAGNOSIS — Z5320 Procedure and treatment not carried out because of patient's decision for unspecified reasons: Secondary | ICD-10-CM | POA: Diagnosis not present

## 2024-01-20 DIAGNOSIS — N189 Chronic kidney disease, unspecified: Secondary | ICD-10-CM | POA: Diagnosis not present

## 2024-01-20 DIAGNOSIS — I5022 Chronic systolic (congestive) heart failure: Secondary | ICD-10-CM | POA: Diagnosis not present

## 2024-01-20 DIAGNOSIS — I4891 Unspecified atrial fibrillation: Secondary | ICD-10-CM | POA: Diagnosis not present

## 2024-01-20 DIAGNOSIS — F02811 Dementia in other diseases classified elsewhere, unspecified severity, with agitation: Secondary | ICD-10-CM | POA: Diagnosis not present

## 2024-01-20 DIAGNOSIS — Z7901 Long term (current) use of anticoagulants: Secondary | ICD-10-CM | POA: Diagnosis not present

## 2024-01-23 DIAGNOSIS — E039 Hypothyroidism, unspecified: Secondary | ICD-10-CM | POA: Diagnosis not present

## 2024-01-30 DIAGNOSIS — I10 Essential (primary) hypertension: Secondary | ICD-10-CM | POA: Diagnosis not present

## 2024-01-30 DIAGNOSIS — I5022 Chronic systolic (congestive) heart failure: Secondary | ICD-10-CM | POA: Diagnosis not present

## 2024-01-30 DIAGNOSIS — E785 Hyperlipidemia, unspecified: Secondary | ICD-10-CM | POA: Diagnosis not present

## 2024-01-30 DIAGNOSIS — F03B0 Unspecified dementia, moderate, without behavioral disturbance, psychotic disturbance, mood disturbance, and anxiety: Secondary | ICD-10-CM | POA: Diagnosis not present

## 2024-02-02 DIAGNOSIS — F03B11 Unspecified dementia, moderate, with agitation: Secondary | ICD-10-CM | POA: Diagnosis not present

## 2024-02-02 DIAGNOSIS — F338 Other recurrent depressive disorders: Secondary | ICD-10-CM | POA: Diagnosis not present
